# Patient Record
Sex: Female | Born: 1935 | Race: White | Hispanic: No | Marital: Married | Smoking: Never smoker
Health system: Southern US, Community
[De-identification: ages and names within clinical notes are randomized; demographics above are authoritative.]

## PROBLEM LIST (undated history)

## (undated) DIAGNOSIS — L0390 Cellulitis, unspecified: Secondary | ICD-10-CM

## (undated) DIAGNOSIS — J189 Pneumonia, unspecified organism: Secondary | ICD-10-CM

## (undated) DIAGNOSIS — M1990 Unspecified osteoarthritis, unspecified site: Secondary | ICD-10-CM

## (undated) DIAGNOSIS — E559 Vitamin D deficiency, unspecified: Secondary | ICD-10-CM

## (undated) DIAGNOSIS — J84112 Idiopathic pulmonary fibrosis: Secondary | ICD-10-CM

## (undated) DIAGNOSIS — N3090 Cystitis, unspecified without hematuria: Secondary | ICD-10-CM

## (undated) DIAGNOSIS — E7800 Pure hypercholesterolemia, unspecified: Secondary | ICD-10-CM

## (undated) DIAGNOSIS — K5900 Constipation, unspecified: Secondary | ICD-10-CM

## (undated) DIAGNOSIS — K5790 Diverticulosis of intestine, part unspecified, without perforation or abscess without bleeding: Secondary | ICD-10-CM

## (undated) DIAGNOSIS — B370 Candidal stomatitis: Secondary | ICD-10-CM

## (undated) DIAGNOSIS — M8580 Other specified disorders of bone density and structure, unspecified site: Secondary | ICD-10-CM

## (undated) DIAGNOSIS — R32 Unspecified urinary incontinence: Secondary | ICD-10-CM

## (undated) DIAGNOSIS — I10 Essential (primary) hypertension: Secondary | ICD-10-CM

## (undated) DIAGNOSIS — R0789 Other chest pain: Secondary | ICD-10-CM

## (undated) DIAGNOSIS — Z9889 Other specified postprocedural states: Secondary | ICD-10-CM

## (undated) DIAGNOSIS — R4182 Altered mental status, unspecified: Secondary | ICD-10-CM

## (undated) DIAGNOSIS — E669 Obesity, unspecified: Secondary | ICD-10-CM

## (undated) DIAGNOSIS — R0602 Shortness of breath: Secondary | ICD-10-CM

## (undated) DIAGNOSIS — G4730 Sleep apnea, unspecified: Secondary | ICD-10-CM

## (undated) DIAGNOSIS — Z6830 Body mass index (BMI) 30.0-30.9, adult: Secondary | ICD-10-CM

## (undated) DIAGNOSIS — R5383 Other fatigue: Secondary | ICD-10-CM

## (undated) DIAGNOSIS — I209 Angina pectoris, unspecified: Secondary | ICD-10-CM

## (undated) DIAGNOSIS — F419 Anxiety disorder, unspecified: Secondary | ICD-10-CM

## (undated) DIAGNOSIS — K529 Noninfective gastroenteritis and colitis, unspecified: Secondary | ICD-10-CM

## (undated) DIAGNOSIS — IMO0002 Reserved for concepts with insufficient information to code with codable children: Secondary | ICD-10-CM

## (undated) DIAGNOSIS — S32040A Wedge compression fracture of fourth lumbar vertebra, initial encounter for closed fracture: Secondary | ICD-10-CM

## (undated) DIAGNOSIS — Z8739 Personal history of other diseases of the musculoskeletal system and connective tissue: Secondary | ICD-10-CM

## (undated) HISTORY — DX: Body mass index (BMI) 30.0-30.9, adult: Z68.30

## (undated) HISTORY — DX: Unspecified urinary incontinence: R32

## (undated) HISTORY — DX: Personal history of other diseases of the musculoskeletal system and connective tissue: Z87.39

## (undated) HISTORY — DX: Pneumonia, unspecified organism: J18.9

## (undated) HISTORY — DX: Other specified postprocedural states: Z98.890

## (undated) HISTORY — DX: Obesity, unspecified: E66.9

## (undated) HISTORY — DX: Unspecified osteoarthritis, unspecified site: M19.90

## (undated) HISTORY — DX: Essential (primary) hypertension: I10

## (undated) HISTORY — PX: ROTATOR CUFF REPAIR: SHX139

## (undated) HISTORY — DX: Reserved for concepts with insufficient information to code with codable children: IMO0002

## (undated) HISTORY — DX: Shortness of breath: R06.02

## (undated) HISTORY — PX: JOINT REPLACEMENT: SHX530

## (undated) HISTORY — PX: TONSILLECTOMY: SUR1361

## (undated) HISTORY — DX: Pure hypercholesterolemia, unspecified: E78.00

## (undated) HISTORY — DX: Idiopathic pulmonary fibrosis: J84.112

## (undated) HISTORY — DX: Other specified disorders of bone density and structure, unspecified site: M85.80

## (undated) HISTORY — DX: Vitamin D deficiency, unspecified: E55.9

## (undated) HISTORY — DX: Other fatigue: R53.83

## (undated) HISTORY — PX: CHOLECYSTECTOMY: SHX55

## (undated) HISTORY — DX: Diverticulosis of intestine, part unspecified, without perforation or abscess without bleeding: K57.90

## (undated) HISTORY — PX: BACK SURGERY: SHX140

## (undated) HISTORY — PX: EYE SURGERY: SHX253

## (undated) HISTORY — DX: Cystitis, unspecified without hematuria: N30.90

## (undated) HISTORY — DX: Hypocalcemia: E83.51

## (undated) HISTORY — DX: Altered mental status, unspecified: R41.82

## (undated) HISTORY — DX: Sleep apnea, unspecified: G47.30

## (undated) HISTORY — DX: Other chest pain: R07.89

## (undated) HISTORY — DX: Constipation, unspecified: K59.00

## (undated) HISTORY — DX: Wedge compression fracture of fourth lumbar vertebra, initial encounter for closed fracture: S32.040A

## (undated) HISTORY — DX: Candidal stomatitis: B37.0

## (undated) HISTORY — DX: Noninfective gastroenteritis and colitis, unspecified: K52.9

## (undated) HISTORY — DX: Cellulitis, unspecified: L03.90

## (undated) HISTORY — PX: TOTAL SHOULDER REPLACEMENT: SUR1217

---

## 2001-08-12 ENCOUNTER — Other Ambulatory Visit: Admission: RE | Admit: 2001-08-12 | Discharge: 2001-08-12 | Payer: Self-pay | Admitting: Family Medicine

## 2003-04-28 ENCOUNTER — Other Ambulatory Visit: Admission: RE | Admit: 2003-04-28 | Discharge: 2003-04-28 | Payer: Self-pay | Admitting: Family Medicine

## 2004-07-23 ENCOUNTER — Ambulatory Visit: Payer: Self-pay | Admitting: Family Medicine

## 2005-07-29 ENCOUNTER — Ambulatory Visit: Payer: Self-pay | Admitting: Family Medicine

## 2006-03-11 ENCOUNTER — Ambulatory Visit: Payer: Self-pay

## 2006-03-26 ENCOUNTER — Ambulatory Visit: Payer: Self-pay

## 2006-04-19 ENCOUNTER — Encounter: Admission: RE | Admit: 2006-04-19 | Discharge: 2006-04-19 | Payer: Self-pay | Admitting: Unknown Physician Specialty

## 2006-05-16 ENCOUNTER — Inpatient Hospital Stay: Payer: Self-pay | Admitting: Unknown Physician Specialty

## 2006-10-16 ENCOUNTER — Ambulatory Visit: Payer: Self-pay | Admitting: Family Medicine

## 2006-11-27 ENCOUNTER — Ambulatory Visit: Payer: Self-pay | Admitting: Family Medicine

## 2007-09-03 LAB — HM COLONOSCOPY

## 2007-12-10 ENCOUNTER — Ambulatory Visit: Payer: Self-pay | Admitting: Family Medicine

## 2008-08-23 ENCOUNTER — Ambulatory Visit: Payer: Self-pay | Admitting: Unknown Physician Specialty

## 2009-03-09 ENCOUNTER — Ambulatory Visit: Payer: Self-pay | Admitting: Unknown Physician Specialty

## 2009-03-22 ENCOUNTER — Ambulatory Visit: Payer: Self-pay | Admitting: Unknown Physician Specialty

## 2009-03-30 ENCOUNTER — Ambulatory Visit: Payer: Self-pay | Admitting: Unknown Physician Specialty

## 2009-05-20 ENCOUNTER — Other Ambulatory Visit: Payer: Self-pay | Admitting: Family Medicine

## 2009-05-30 ENCOUNTER — Ambulatory Visit: Payer: Self-pay | Admitting: Family Medicine

## 2009-12-25 ENCOUNTER — Ambulatory Visit: Payer: Self-pay | Admitting: Unknown Physician Specialty

## 2010-01-08 ENCOUNTER — Ambulatory Visit: Payer: Self-pay | Admitting: Unknown Physician Specialty

## 2010-01-11 ENCOUNTER — Ambulatory Visit: Payer: Self-pay | Admitting: Unknown Physician Specialty

## 2010-08-10 ENCOUNTER — Ambulatory Visit: Payer: Self-pay | Admitting: Family Medicine

## 2010-08-21 ENCOUNTER — Ambulatory Visit: Payer: Self-pay | Admitting: Family Medicine

## 2010-12-03 ENCOUNTER — Emergency Department (HOSPITAL_COMMUNITY): Admission: EM | Admit: 2010-12-03 | Payer: Self-pay | Source: Home / Self Care

## 2011-08-15 ENCOUNTER — Ambulatory Visit: Payer: Self-pay | Admitting: Family Medicine

## 2011-09-10 DIAGNOSIS — M25519 Pain in unspecified shoulder: Secondary | ICD-10-CM | POA: Diagnosis not present

## 2011-09-17 DIAGNOSIS — M25519 Pain in unspecified shoulder: Secondary | ICD-10-CM | POA: Diagnosis not present

## 2011-09-17 DIAGNOSIS — Z96619 Presence of unspecified artificial shoulder joint: Secondary | ICD-10-CM | POA: Diagnosis not present

## 2011-09-24 DIAGNOSIS — M25519 Pain in unspecified shoulder: Secondary | ICD-10-CM | POA: Diagnosis not present

## 2011-09-24 DIAGNOSIS — Z96619 Presence of unspecified artificial shoulder joint: Secondary | ICD-10-CM | POA: Diagnosis not present

## 2011-10-01 DIAGNOSIS — M25519 Pain in unspecified shoulder: Secondary | ICD-10-CM | POA: Diagnosis not present

## 2011-10-01 DIAGNOSIS — Z96619 Presence of unspecified artificial shoulder joint: Secondary | ICD-10-CM | POA: Diagnosis not present

## 2011-10-04 DIAGNOSIS — Z96619 Presence of unspecified artificial shoulder joint: Secondary | ICD-10-CM | POA: Diagnosis not present

## 2011-10-04 DIAGNOSIS — M25519 Pain in unspecified shoulder: Secondary | ICD-10-CM | POA: Diagnosis not present

## 2011-10-08 DIAGNOSIS — M25519 Pain in unspecified shoulder: Secondary | ICD-10-CM | POA: Diagnosis not present

## 2011-10-08 DIAGNOSIS — Z96619 Presence of unspecified artificial shoulder joint: Secondary | ICD-10-CM | POA: Diagnosis not present

## 2011-10-15 DIAGNOSIS — Z96619 Presence of unspecified artificial shoulder joint: Secondary | ICD-10-CM | POA: Diagnosis not present

## 2011-10-15 DIAGNOSIS — M25519 Pain in unspecified shoulder: Secondary | ICD-10-CM | POA: Diagnosis not present

## 2011-10-22 DIAGNOSIS — M25519 Pain in unspecified shoulder: Secondary | ICD-10-CM | POA: Diagnosis not present

## 2011-10-22 DIAGNOSIS — Z96619 Presence of unspecified artificial shoulder joint: Secondary | ICD-10-CM | POA: Diagnosis not present

## 2011-10-28 DIAGNOSIS — R0789 Other chest pain: Secondary | ICD-10-CM | POA: Diagnosis not present

## 2011-10-28 DIAGNOSIS — I10 Essential (primary) hypertension: Secondary | ICD-10-CM | POA: Diagnosis not present

## 2011-10-29 DIAGNOSIS — Z96619 Presence of unspecified artificial shoulder joint: Secondary | ICD-10-CM | POA: Diagnosis not present

## 2011-10-29 DIAGNOSIS — M25519 Pain in unspecified shoulder: Secondary | ICD-10-CM | POA: Diagnosis not present

## 2011-11-04 ENCOUNTER — Ambulatory Visit: Payer: Self-pay | Admitting: Internal Medicine

## 2011-11-04 DIAGNOSIS — R0989 Other specified symptoms and signs involving the circulatory and respiratory systems: Secondary | ICD-10-CM | POA: Diagnosis not present

## 2011-11-04 DIAGNOSIS — R0609 Other forms of dyspnea: Secondary | ICD-10-CM | POA: Diagnosis not present

## 2011-11-04 DIAGNOSIS — R0789 Other chest pain: Secondary | ICD-10-CM | POA: Diagnosis not present

## 2011-11-04 DIAGNOSIS — I209 Angina pectoris, unspecified: Secondary | ICD-10-CM | POA: Diagnosis not present

## 2011-11-04 DIAGNOSIS — R079 Chest pain, unspecified: Secondary | ICD-10-CM | POA: Diagnosis not present

## 2011-11-18 DIAGNOSIS — E7800 Pure hypercholesterolemia, unspecified: Secondary | ICD-10-CM | POA: Diagnosis not present

## 2011-11-18 DIAGNOSIS — G4700 Insomnia, unspecified: Secondary | ICD-10-CM | POA: Diagnosis not present

## 2011-11-18 DIAGNOSIS — R5383 Other fatigue: Secondary | ICD-10-CM | POA: Diagnosis not present

## 2011-11-18 DIAGNOSIS — R413 Other amnesia: Secondary | ICD-10-CM | POA: Diagnosis not present

## 2011-11-18 DIAGNOSIS — R5381 Other malaise: Secondary | ICD-10-CM | POA: Diagnosis not present

## 2011-11-18 DIAGNOSIS — E559 Vitamin D deficiency, unspecified: Secondary | ICD-10-CM | POA: Diagnosis not present

## 2011-11-18 DIAGNOSIS — I10 Essential (primary) hypertension: Secondary | ICD-10-CM | POA: Diagnosis not present

## 2011-12-05 ENCOUNTER — Ambulatory Visit: Payer: Self-pay | Admitting: Family Medicine

## 2011-12-05 DIAGNOSIS — G2589 Other specified extrapyramidal and movement disorders: Secondary | ICD-10-CM | POA: Diagnosis not present

## 2011-12-05 DIAGNOSIS — G2581 Restless legs syndrome: Secondary | ICD-10-CM | POA: Diagnosis not present

## 2011-12-05 DIAGNOSIS — R0609 Other forms of dyspnea: Secondary | ICD-10-CM | POA: Diagnosis not present

## 2011-12-05 DIAGNOSIS — G4733 Obstructive sleep apnea (adult) (pediatric): Secondary | ICD-10-CM | POA: Diagnosis not present

## 2011-12-05 DIAGNOSIS — R0989 Other specified symptoms and signs involving the circulatory and respiratory systems: Secondary | ICD-10-CM | POA: Diagnosis not present

## 2011-12-18 DIAGNOSIS — I209 Angina pectoris, unspecified: Secondary | ICD-10-CM | POA: Diagnosis not present

## 2011-12-18 DIAGNOSIS — R0789 Other chest pain: Secondary | ICD-10-CM | POA: Diagnosis not present

## 2011-12-20 DIAGNOSIS — M7660 Achilles tendinitis, unspecified leg: Secondary | ICD-10-CM | POA: Diagnosis not present

## 2011-12-23 DIAGNOSIS — R6889 Other general symptoms and signs: Secondary | ICD-10-CM | POA: Diagnosis not present

## 2011-12-23 DIAGNOSIS — R413 Other amnesia: Secondary | ICD-10-CM | POA: Diagnosis not present

## 2011-12-23 DIAGNOSIS — R05 Cough: Secondary | ICD-10-CM | POA: Diagnosis not present

## 2011-12-23 DIAGNOSIS — R059 Cough, unspecified: Secondary | ICD-10-CM | POA: Diagnosis not present

## 2011-12-23 DIAGNOSIS — E7800 Pure hypercholesterolemia, unspecified: Secondary | ICD-10-CM | POA: Diagnosis not present

## 2011-12-25 ENCOUNTER — Ambulatory Visit: Payer: Self-pay | Admitting: Family Medicine

## 2011-12-25 DIAGNOSIS — R05 Cough: Secondary | ICD-10-CM | POA: Diagnosis not present

## 2011-12-25 DIAGNOSIS — R161 Splenomegaly, not elsewhere classified: Secondary | ICD-10-CM | POA: Diagnosis not present

## 2011-12-25 DIAGNOSIS — E7800 Pure hypercholesterolemia, unspecified: Secondary | ICD-10-CM | POA: Diagnosis not present

## 2011-12-25 DIAGNOSIS — J9819 Other pulmonary collapse: Secondary | ICD-10-CM | POA: Diagnosis not present

## 2011-12-25 DIAGNOSIS — R9389 Abnormal findings on diagnostic imaging of other specified body structures: Secondary | ICD-10-CM | POA: Diagnosis not present

## 2011-12-25 DIAGNOSIS — R0602 Shortness of breath: Secondary | ICD-10-CM | POA: Diagnosis not present

## 2011-12-25 DIAGNOSIS — R059 Cough, unspecified: Secondary | ICD-10-CM | POA: Diagnosis not present

## 2011-12-25 DIAGNOSIS — R413 Other amnesia: Secondary | ICD-10-CM | POA: Diagnosis not present

## 2012-01-09 ENCOUNTER — Ambulatory Visit: Payer: Self-pay | Admitting: Family Medicine

## 2012-01-09 DIAGNOSIS — G4734 Idiopathic sleep related nonobstructive alveolar hypoventilation: Secondary | ICD-10-CM | POA: Diagnosis not present

## 2012-01-09 DIAGNOSIS — R0609 Other forms of dyspnea: Secondary | ICD-10-CM | POA: Diagnosis not present

## 2012-01-09 DIAGNOSIS — G4733 Obstructive sleep apnea (adult) (pediatric): Secondary | ICD-10-CM | POA: Diagnosis not present

## 2012-01-09 DIAGNOSIS — R0989 Other specified symptoms and signs involving the circulatory and respiratory systems: Secondary | ICD-10-CM | POA: Diagnosis not present

## 2012-01-23 ENCOUNTER — Ambulatory Visit: Payer: Self-pay | Admitting: Family Medicine

## 2012-01-23 DIAGNOSIS — R0602 Shortness of breath: Secondary | ICD-10-CM | POA: Diagnosis not present

## 2012-01-23 DIAGNOSIS — R059 Cough, unspecified: Secondary | ICD-10-CM | POA: Diagnosis not present

## 2012-01-23 DIAGNOSIS — R918 Other nonspecific abnormal finding of lung field: Secondary | ICD-10-CM | POA: Diagnosis not present

## 2012-01-24 DIAGNOSIS — R05 Cough: Secondary | ICD-10-CM | POA: Diagnosis not present

## 2012-01-24 DIAGNOSIS — R0602 Shortness of breath: Secondary | ICD-10-CM | POA: Diagnosis not present

## 2012-01-24 DIAGNOSIS — R413 Other amnesia: Secondary | ICD-10-CM | POA: Diagnosis not present

## 2012-01-24 DIAGNOSIS — R059 Cough, unspecified: Secondary | ICD-10-CM | POA: Diagnosis not present

## 2012-01-24 DIAGNOSIS — E7800 Pure hypercholesterolemia, unspecified: Secondary | ICD-10-CM | POA: Diagnosis not present

## 2012-01-30 ENCOUNTER — Ambulatory Visit: Payer: Self-pay | Admitting: Family Medicine

## 2012-01-30 DIAGNOSIS — G4733 Obstructive sleep apnea (adult) (pediatric): Secondary | ICD-10-CM | POA: Diagnosis not present

## 2012-02-07 DIAGNOSIS — R413 Other amnesia: Secondary | ICD-10-CM | POA: Diagnosis not present

## 2012-02-07 DIAGNOSIS — E7800 Pure hypercholesterolemia, unspecified: Secondary | ICD-10-CM | POA: Diagnosis not present

## 2012-02-07 DIAGNOSIS — G4730 Sleep apnea, unspecified: Secondary | ICD-10-CM | POA: Diagnosis not present

## 2012-02-07 DIAGNOSIS — R0602 Shortness of breath: Secondary | ICD-10-CM | POA: Diagnosis not present

## 2012-02-19 DIAGNOSIS — R0602 Shortness of breath: Secondary | ICD-10-CM | POA: Diagnosis not present

## 2012-02-19 DIAGNOSIS — E669 Obesity, unspecified: Secondary | ICD-10-CM | POA: Diagnosis not present

## 2012-02-19 DIAGNOSIS — J9819 Other pulmonary collapse: Secondary | ICD-10-CM | POA: Diagnosis not present

## 2012-02-24 ENCOUNTER — Ambulatory Visit: Payer: Self-pay | Admitting: Specialist

## 2012-02-24 DIAGNOSIS — R0602 Shortness of breath: Secondary | ICD-10-CM | POA: Diagnosis not present

## 2012-02-24 DIAGNOSIS — J8410 Pulmonary fibrosis, unspecified: Secondary | ICD-10-CM | POA: Diagnosis not present

## 2012-02-24 DIAGNOSIS — R918 Other nonspecific abnormal finding of lung field: Secondary | ICD-10-CM | POA: Diagnosis not present

## 2012-02-26 DIAGNOSIS — J8410 Pulmonary fibrosis, unspecified: Secondary | ICD-10-CM | POA: Diagnosis not present

## 2012-02-26 DIAGNOSIS — J45909 Unspecified asthma, uncomplicated: Secondary | ICD-10-CM | POA: Diagnosis not present

## 2012-02-26 DIAGNOSIS — R0602 Shortness of breath: Secondary | ICD-10-CM | POA: Diagnosis not present

## 2012-02-26 DIAGNOSIS — G4733 Obstructive sleep apnea (adult) (pediatric): Secondary | ICD-10-CM | POA: Diagnosis not present

## 2012-03-11 ENCOUNTER — Institutional Professional Consult (permissible substitution): Payer: Self-pay | Admitting: Pulmonary Disease

## 2012-04-01 DIAGNOSIS — E669 Obesity, unspecified: Secondary | ICD-10-CM | POA: Diagnosis not present

## 2012-04-01 DIAGNOSIS — G4700 Insomnia, unspecified: Secondary | ICD-10-CM | POA: Diagnosis not present

## 2012-04-01 DIAGNOSIS — E7800 Pure hypercholesterolemia, unspecified: Secondary | ICD-10-CM | POA: Diagnosis not present

## 2012-04-01 DIAGNOSIS — R413 Other amnesia: Secondary | ICD-10-CM | POA: Diagnosis not present

## 2012-04-28 ENCOUNTER — Ambulatory Visit: Payer: Self-pay | Admitting: Family Medicine

## 2012-04-28 DIAGNOSIS — M949 Disorder of cartilage, unspecified: Secondary | ICD-10-CM | POA: Diagnosis not present

## 2012-04-28 DIAGNOSIS — M899 Disorder of bone, unspecified: Secondary | ICD-10-CM | POA: Diagnosis not present

## 2012-05-18 DIAGNOSIS — E7800 Pure hypercholesterolemia, unspecified: Secondary | ICD-10-CM | POA: Diagnosis not present

## 2012-05-18 DIAGNOSIS — M899 Disorder of bone, unspecified: Secondary | ICD-10-CM | POA: Diagnosis not present

## 2012-05-18 DIAGNOSIS — Z23 Encounter for immunization: Secondary | ICD-10-CM | POA: Diagnosis not present

## 2012-05-18 DIAGNOSIS — I10 Essential (primary) hypertension: Secondary | ICD-10-CM | POA: Diagnosis not present

## 2012-05-18 DIAGNOSIS — R413 Other amnesia: Secondary | ICD-10-CM | POA: Diagnosis not present

## 2012-05-18 DIAGNOSIS — M949 Disorder of cartilage, unspecified: Secondary | ICD-10-CM | POA: Diagnosis not present

## 2012-05-20 DIAGNOSIS — E7800 Pure hypercholesterolemia, unspecified: Secondary | ICD-10-CM | POA: Diagnosis not present

## 2012-05-20 DIAGNOSIS — E559 Vitamin D deficiency, unspecified: Secondary | ICD-10-CM | POA: Diagnosis not present

## 2012-06-04 DIAGNOSIS — M25519 Pain in unspecified shoulder: Secondary | ICD-10-CM | POA: Diagnosis not present

## 2012-06-11 DIAGNOSIS — E876 Hypokalemia: Secondary | ICD-10-CM | POA: Diagnosis not present

## 2012-06-25 ENCOUNTER — Ambulatory Visit: Payer: Self-pay | Admitting: Specialist

## 2012-06-25 DIAGNOSIS — J8410 Pulmonary fibrosis, unspecified: Secondary | ICD-10-CM | POA: Diagnosis not present

## 2012-06-25 DIAGNOSIS — J984 Other disorders of lung: Secondary | ICD-10-CM | POA: Diagnosis not present

## 2012-06-25 DIAGNOSIS — R599 Enlarged lymph nodes, unspecified: Secondary | ICD-10-CM | POA: Diagnosis not present

## 2012-06-29 DIAGNOSIS — H02409 Unspecified ptosis of unspecified eyelid: Secondary | ICD-10-CM | POA: Diagnosis not present

## 2012-07-01 DIAGNOSIS — R0609 Other forms of dyspnea: Secondary | ICD-10-CM | POA: Diagnosis not present

## 2012-07-01 DIAGNOSIS — R0989 Other specified symptoms and signs involving the circulatory and respiratory systems: Secondary | ICD-10-CM | POA: Diagnosis not present

## 2012-07-01 DIAGNOSIS — R9431 Abnormal electrocardiogram [ECG] [EKG]: Secondary | ICD-10-CM | POA: Diagnosis not present

## 2012-07-07 DIAGNOSIS — J8410 Pulmonary fibrosis, unspecified: Secondary | ICD-10-CM | POA: Diagnosis not present

## 2012-07-22 DIAGNOSIS — L821 Other seborrheic keratosis: Secondary | ICD-10-CM | POA: Diagnosis not present

## 2012-07-22 DIAGNOSIS — L570 Actinic keratosis: Secondary | ICD-10-CM | POA: Diagnosis not present

## 2012-07-22 DIAGNOSIS — R229 Localized swelling, mass and lump, unspecified: Secondary | ICD-10-CM | POA: Diagnosis not present

## 2012-07-22 DIAGNOSIS — L538 Other specified erythematous conditions: Secondary | ICD-10-CM | POA: Diagnosis not present

## 2012-07-23 ENCOUNTER — Ambulatory Visit: Payer: Self-pay | Admitting: Specialist

## 2012-07-23 DIAGNOSIS — R599 Enlarged lymph nodes, unspecified: Secondary | ICD-10-CM | POA: Diagnosis not present

## 2012-07-23 DIAGNOSIS — C3490 Malignant neoplasm of unspecified part of unspecified bronchus or lung: Secondary | ICD-10-CM | POA: Diagnosis not present

## 2012-07-28 DIAGNOSIS — R222 Localized swelling, mass and lump, trunk: Secondary | ICD-10-CM | POA: Diagnosis not present

## 2012-08-10 DIAGNOSIS — H02409 Unspecified ptosis of unspecified eyelid: Secondary | ICD-10-CM | POA: Diagnosis not present

## 2012-08-17 DIAGNOSIS — G4730 Sleep apnea, unspecified: Secondary | ICD-10-CM | POA: Diagnosis not present

## 2012-08-17 DIAGNOSIS — E669 Obesity, unspecified: Secondary | ICD-10-CM | POA: Diagnosis not present

## 2012-08-17 DIAGNOSIS — R05 Cough: Secondary | ICD-10-CM | POA: Diagnosis not present

## 2012-08-17 DIAGNOSIS — Z23 Encounter for immunization: Secondary | ICD-10-CM | POA: Diagnosis not present

## 2012-08-17 DIAGNOSIS — R059 Cough, unspecified: Secondary | ICD-10-CM | POA: Diagnosis not present

## 2012-08-20 DIAGNOSIS — H02409 Unspecified ptosis of unspecified eyelid: Secondary | ICD-10-CM | POA: Diagnosis not present

## 2012-09-29 ENCOUNTER — Ambulatory Visit: Payer: Self-pay | Admitting: Family Medicine

## 2012-09-29 DIAGNOSIS — Z1231 Encounter for screening mammogram for malignant neoplasm of breast: Secondary | ICD-10-CM | POA: Diagnosis not present

## 2012-10-30 ENCOUNTER — Ambulatory Visit: Payer: Self-pay | Admitting: Anesthesiology

## 2012-10-30 DIAGNOSIS — I10 Essential (primary) hypertension: Secondary | ICD-10-CM | POA: Diagnosis not present

## 2012-10-30 DIAGNOSIS — Z01810 Encounter for preprocedural cardiovascular examination: Secondary | ICD-10-CM | POA: Diagnosis not present

## 2012-11-03 ENCOUNTER — Ambulatory Visit: Payer: Self-pay

## 2012-11-03 DIAGNOSIS — G4730 Sleep apnea, unspecified: Secondary | ICD-10-CM | POA: Diagnosis not present

## 2012-11-03 DIAGNOSIS — R002 Palpitations: Secondary | ICD-10-CM | POA: Diagnosis not present

## 2012-11-03 DIAGNOSIS — Z7982 Long term (current) use of aspirin: Secondary | ICD-10-CM | POA: Diagnosis not present

## 2012-11-03 DIAGNOSIS — H02409 Unspecified ptosis of unspecified eyelid: Secondary | ICD-10-CM | POA: Diagnosis not present

## 2012-11-03 DIAGNOSIS — R12 Heartburn: Secondary | ICD-10-CM | POA: Diagnosis not present

## 2012-11-03 DIAGNOSIS — Z96619 Presence of unspecified artificial shoulder joint: Secondary | ICD-10-CM | POA: Diagnosis not present

## 2012-11-03 DIAGNOSIS — R51 Headache: Secondary | ICD-10-CM | POA: Diagnosis not present

## 2012-11-03 DIAGNOSIS — I10 Essential (primary) hypertension: Secondary | ICD-10-CM | POA: Diagnosis not present

## 2012-11-03 DIAGNOSIS — J449 Chronic obstructive pulmonary disease, unspecified: Secondary | ICD-10-CM | POA: Diagnosis not present

## 2012-11-03 DIAGNOSIS — H02839 Dermatochalasis of unspecified eye, unspecified eyelid: Secondary | ICD-10-CM | POA: Diagnosis not present

## 2012-11-03 DIAGNOSIS — R059 Cough, unspecified: Secondary | ICD-10-CM | POA: Diagnosis not present

## 2012-11-03 DIAGNOSIS — Z79899 Other long term (current) drug therapy: Secondary | ICD-10-CM | POA: Diagnosis not present

## 2012-12-14 DIAGNOSIS — J449 Chronic obstructive pulmonary disease, unspecified: Secondary | ICD-10-CM | POA: Diagnosis not present

## 2012-12-14 DIAGNOSIS — J8410 Pulmonary fibrosis, unspecified: Secondary | ICD-10-CM | POA: Diagnosis not present

## 2012-12-14 DIAGNOSIS — R05 Cough: Secondary | ICD-10-CM | POA: Diagnosis not present

## 2012-12-14 DIAGNOSIS — R059 Cough, unspecified: Secondary | ICD-10-CM | POA: Diagnosis not present

## 2012-12-14 DIAGNOSIS — R0602 Shortness of breath: Secondary | ICD-10-CM | POA: Diagnosis not present

## 2012-12-30 DIAGNOSIS — R0609 Other forms of dyspnea: Secondary | ICD-10-CM | POA: Diagnosis not present

## 2012-12-30 DIAGNOSIS — I369 Nonrheumatic tricuspid valve disorder, unspecified: Secondary | ICD-10-CM | POA: Diagnosis not present

## 2012-12-30 DIAGNOSIS — R0989 Other specified symptoms and signs involving the circulatory and respiratory systems: Secondary | ICD-10-CM | POA: Diagnosis not present

## 2013-01-05 ENCOUNTER — Ambulatory Visit: Payer: Self-pay | Admitting: Specialist

## 2013-01-05 DIAGNOSIS — R918 Other nonspecific abnormal finding of lung field: Secondary | ICD-10-CM | POA: Diagnosis not present

## 2013-01-05 DIAGNOSIS — J984 Other disorders of lung: Secondary | ICD-10-CM | POA: Diagnosis not present

## 2013-01-11 DIAGNOSIS — R918 Other nonspecific abnormal finding of lung field: Secondary | ICD-10-CM | POA: Diagnosis not present

## 2013-01-11 DIAGNOSIS — J8410 Pulmonary fibrosis, unspecified: Secondary | ICD-10-CM | POA: Diagnosis not present

## 2013-01-11 DIAGNOSIS — R0602 Shortness of breath: Secondary | ICD-10-CM | POA: Diagnosis not present

## 2013-01-11 DIAGNOSIS — G4733 Obstructive sleep apnea (adult) (pediatric): Secondary | ICD-10-CM | POA: Diagnosis not present

## 2013-01-13 DIAGNOSIS — H02409 Unspecified ptosis of unspecified eyelid: Secondary | ICD-10-CM | POA: Diagnosis not present

## 2013-01-18 DIAGNOSIS — R011 Cardiac murmur, unspecified: Secondary | ICD-10-CM | POA: Diagnosis not present

## 2013-01-18 DIAGNOSIS — I369 Nonrheumatic tricuspid valve disorder, unspecified: Secondary | ICD-10-CM | POA: Diagnosis not present

## 2013-01-18 DIAGNOSIS — R0602 Shortness of breath: Secondary | ICD-10-CM | POA: Diagnosis not present

## 2013-03-11 DIAGNOSIS — H02409 Unspecified ptosis of unspecified eyelid: Secondary | ICD-10-CM | POA: Diagnosis not present

## 2013-03-11 DIAGNOSIS — C44101 Unspecified malignant neoplasm of skin of unspecified eyelid, including canthus: Secondary | ICD-10-CM | POA: Diagnosis not present

## 2013-03-12 DIAGNOSIS — D1801 Hemangioma of skin and subcutaneous tissue: Secondary | ICD-10-CM | POA: Diagnosis not present

## 2013-03-12 DIAGNOSIS — C44101 Unspecified malignant neoplasm of skin of unspecified eyelid, including canthus: Secondary | ICD-10-CM | POA: Diagnosis not present

## 2013-04-20 DIAGNOSIS — H2510 Age-related nuclear cataract, unspecified eye: Secondary | ICD-10-CM | POA: Diagnosis not present

## 2013-05-06 DIAGNOSIS — L821 Other seborrheic keratosis: Secondary | ICD-10-CM | POA: Diagnosis not present

## 2013-05-06 DIAGNOSIS — L819 Disorder of pigmentation, unspecified: Secondary | ICD-10-CM | POA: Diagnosis not present

## 2013-05-06 DIAGNOSIS — D485 Neoplasm of uncertain behavior of skin: Secondary | ICD-10-CM | POA: Diagnosis not present

## 2013-05-06 DIAGNOSIS — L408 Other psoriasis: Secondary | ICD-10-CM | POA: Diagnosis not present

## 2013-05-06 DIAGNOSIS — D0439 Carcinoma in situ of skin of other parts of face: Secondary | ICD-10-CM | POA: Diagnosis not present

## 2013-05-06 DIAGNOSIS — D0430 Carcinoma in situ of skin of unspecified part of face: Secondary | ICD-10-CM | POA: Diagnosis not present

## 2013-05-08 DIAGNOSIS — E669 Obesity, unspecified: Secondary | ICD-10-CM | POA: Diagnosis not present

## 2013-05-08 DIAGNOSIS — Z23 Encounter for immunization: Secondary | ICD-10-CM | POA: Diagnosis not present

## 2013-05-08 DIAGNOSIS — R059 Cough, unspecified: Secondary | ICD-10-CM | POA: Diagnosis not present

## 2013-05-08 DIAGNOSIS — G4730 Sleep apnea, unspecified: Secondary | ICD-10-CM | POA: Diagnosis not present

## 2013-05-08 DIAGNOSIS — R05 Cough: Secondary | ICD-10-CM | POA: Diagnosis not present

## 2013-05-25 DIAGNOSIS — R05 Cough: Secondary | ICD-10-CM | POA: Diagnosis not present

## 2013-05-25 DIAGNOSIS — R059 Cough, unspecified: Secondary | ICD-10-CM | POA: Diagnosis not present

## 2013-05-25 DIAGNOSIS — J8410 Pulmonary fibrosis, unspecified: Secondary | ICD-10-CM | POA: Diagnosis not present

## 2013-05-25 DIAGNOSIS — G4733 Obstructive sleep apnea (adult) (pediatric): Secondary | ICD-10-CM | POA: Diagnosis not present

## 2013-06-01 DIAGNOSIS — D0439 Carcinoma in situ of skin of other parts of face: Secondary | ICD-10-CM | POA: Diagnosis not present

## 2013-06-01 DIAGNOSIS — D0430 Carcinoma in situ of skin of unspecified part of face: Secondary | ICD-10-CM | POA: Diagnosis not present

## 2013-06-01 DIAGNOSIS — L408 Other psoriasis: Secondary | ICD-10-CM | POA: Diagnosis not present

## 2013-06-16 ENCOUNTER — Ambulatory Visit: Payer: Self-pay | Admitting: Specialist

## 2013-06-16 DIAGNOSIS — R599 Enlarged lymph nodes, unspecified: Secondary | ICD-10-CM | POA: Diagnosis not present

## 2013-06-17 DIAGNOSIS — J449 Chronic obstructive pulmonary disease, unspecified: Secondary | ICD-10-CM | POA: Diagnosis not present

## 2013-06-17 DIAGNOSIS — R0989 Other specified symptoms and signs involving the circulatory and respiratory systems: Secondary | ICD-10-CM | POA: Diagnosis not present

## 2013-06-17 DIAGNOSIS — R0609 Other forms of dyspnea: Secondary | ICD-10-CM | POA: Diagnosis not present

## 2013-06-17 DIAGNOSIS — G4730 Sleep apnea, unspecified: Secondary | ICD-10-CM | POA: Diagnosis not present

## 2013-06-23 DIAGNOSIS — J8410 Pulmonary fibrosis, unspecified: Secondary | ICD-10-CM | POA: Diagnosis not present

## 2013-06-23 DIAGNOSIS — G4710 Hypersomnia, unspecified: Secondary | ICD-10-CM | POA: Diagnosis not present

## 2013-06-23 DIAGNOSIS — R05 Cough: Secondary | ICD-10-CM | POA: Diagnosis not present

## 2013-06-23 DIAGNOSIS — R0609 Other forms of dyspnea: Secondary | ICD-10-CM | POA: Diagnosis not present

## 2013-06-23 DIAGNOSIS — R059 Cough, unspecified: Secondary | ICD-10-CM | POA: Diagnosis not present

## 2013-06-23 DIAGNOSIS — R0989 Other specified symptoms and signs involving the circulatory and respiratory systems: Secondary | ICD-10-CM | POA: Diagnosis not present

## 2013-07-05 DIAGNOSIS — D0430 Carcinoma in situ of skin of unspecified part of face: Secondary | ICD-10-CM | POA: Diagnosis not present

## 2013-07-05 DIAGNOSIS — D0439 Carcinoma in situ of skin of other parts of face: Secondary | ICD-10-CM | POA: Diagnosis not present

## 2013-08-04 DIAGNOSIS — J449 Chronic obstructive pulmonary disease, unspecified: Secondary | ICD-10-CM | POA: Diagnosis not present

## 2013-08-04 DIAGNOSIS — R0609 Other forms of dyspnea: Secondary | ICD-10-CM | POA: Diagnosis not present

## 2013-08-04 DIAGNOSIS — G4733 Obstructive sleep apnea (adult) (pediatric): Secondary | ICD-10-CM | POA: Diagnosis not present

## 2013-08-04 DIAGNOSIS — J8410 Pulmonary fibrosis, unspecified: Secondary | ICD-10-CM | POA: Diagnosis not present

## 2013-08-04 DIAGNOSIS — R0989 Other specified symptoms and signs involving the circulatory and respiratory systems: Secondary | ICD-10-CM | POA: Diagnosis not present

## 2013-08-10 DIAGNOSIS — M76899 Other specified enthesopathies of unspecified lower limb, excluding foot: Secondary | ICD-10-CM | POA: Diagnosis not present

## 2013-08-23 DIAGNOSIS — M545 Low back pain, unspecified: Secondary | ICD-10-CM | POA: Diagnosis not present

## 2013-08-23 DIAGNOSIS — M5430 Sciatica, unspecified side: Secondary | ICD-10-CM | POA: Diagnosis not present

## 2013-08-25 DIAGNOSIS — M76899 Other specified enthesopathies of unspecified lower limb, excluding foot: Secondary | ICD-10-CM | POA: Diagnosis not present

## 2013-08-25 DIAGNOSIS — M545 Low back pain, unspecified: Secondary | ICD-10-CM | POA: Diagnosis not present

## 2013-08-25 DIAGNOSIS — M5430 Sciatica, unspecified side: Secondary | ICD-10-CM | POA: Diagnosis not present

## 2013-09-01 DIAGNOSIS — M545 Low back pain, unspecified: Secondary | ICD-10-CM | POA: Diagnosis not present

## 2013-09-01 DIAGNOSIS — M5430 Sciatica, unspecified side: Secondary | ICD-10-CM | POA: Diagnosis not present

## 2013-09-01 DIAGNOSIS — M76899 Other specified enthesopathies of unspecified lower limb, excluding foot: Secondary | ICD-10-CM | POA: Diagnosis not present

## 2013-09-07 DIAGNOSIS — M25519 Pain in unspecified shoulder: Secondary | ICD-10-CM | POA: Diagnosis not present

## 2013-09-07 DIAGNOSIS — M76899 Other specified enthesopathies of unspecified lower limb, excluding foot: Secondary | ICD-10-CM | POA: Diagnosis not present

## 2013-09-09 DIAGNOSIS — M810 Age-related osteoporosis without current pathological fracture: Secondary | ICD-10-CM | POA: Diagnosis not present

## 2013-09-13 DIAGNOSIS — M76899 Other specified enthesopathies of unspecified lower limb, excluding foot: Secondary | ICD-10-CM | POA: Diagnosis not present

## 2013-09-13 DIAGNOSIS — M75120 Complete rotator cuff tear or rupture of unspecified shoulder, not specified as traumatic: Secondary | ICD-10-CM | POA: Diagnosis not present

## 2013-09-13 DIAGNOSIS — M25519 Pain in unspecified shoulder: Secondary | ICD-10-CM | POA: Diagnosis not present

## 2013-09-13 DIAGNOSIS — M19019 Primary osteoarthritis, unspecified shoulder: Secondary | ICD-10-CM | POA: Diagnosis not present

## 2013-09-15 DIAGNOSIS — M75120 Complete rotator cuff tear or rupture of unspecified shoulder, not specified as traumatic: Secondary | ICD-10-CM | POA: Diagnosis not present

## 2013-09-15 DIAGNOSIS — M76899 Other specified enthesopathies of unspecified lower limb, excluding foot: Secondary | ICD-10-CM | POA: Diagnosis not present

## 2013-09-15 DIAGNOSIS — M25519 Pain in unspecified shoulder: Secondary | ICD-10-CM | POA: Diagnosis not present

## 2013-09-15 DIAGNOSIS — M19019 Primary osteoarthritis, unspecified shoulder: Secondary | ICD-10-CM | POA: Diagnosis not present

## 2013-09-21 DIAGNOSIS — M19019 Primary osteoarthritis, unspecified shoulder: Secondary | ICD-10-CM | POA: Diagnosis not present

## 2013-09-21 DIAGNOSIS — M5430 Sciatica, unspecified side: Secondary | ICD-10-CM | POA: Diagnosis not present

## 2013-09-21 DIAGNOSIS — M75120 Complete rotator cuff tear or rupture of unspecified shoulder, not specified as traumatic: Secondary | ICD-10-CM | POA: Diagnosis not present

## 2013-09-21 DIAGNOSIS — M76899 Other specified enthesopathies of unspecified lower limb, excluding foot: Secondary | ICD-10-CM | POA: Diagnosis not present

## 2013-09-23 DIAGNOSIS — M545 Low back pain, unspecified: Secondary | ICD-10-CM | POA: Diagnosis not present

## 2013-09-23 DIAGNOSIS — M19019 Primary osteoarthritis, unspecified shoulder: Secondary | ICD-10-CM | POA: Diagnosis not present

## 2013-09-23 DIAGNOSIS — M5430 Sciatica, unspecified side: Secondary | ICD-10-CM | POA: Diagnosis not present

## 2013-09-23 DIAGNOSIS — M76899 Other specified enthesopathies of unspecified lower limb, excluding foot: Secondary | ICD-10-CM | POA: Diagnosis not present

## 2013-09-27 DIAGNOSIS — M545 Low back pain, unspecified: Secondary | ICD-10-CM | POA: Diagnosis not present

## 2013-09-27 DIAGNOSIS — M76899 Other specified enthesopathies of unspecified lower limb, excluding foot: Secondary | ICD-10-CM | POA: Diagnosis not present

## 2013-09-27 DIAGNOSIS — M5430 Sciatica, unspecified side: Secondary | ICD-10-CM | POA: Diagnosis not present

## 2013-09-28 DIAGNOSIS — I10 Essential (primary) hypertension: Secondary | ICD-10-CM | POA: Diagnosis not present

## 2013-09-28 DIAGNOSIS — R413 Other amnesia: Secondary | ICD-10-CM | POA: Diagnosis not present

## 2013-09-28 DIAGNOSIS — E7800 Pure hypercholesterolemia, unspecified: Secondary | ICD-10-CM | POA: Diagnosis not present

## 2013-10-04 DIAGNOSIS — J189 Pneumonia, unspecified organism: Secondary | ICD-10-CM | POA: Diagnosis not present

## 2013-10-04 DIAGNOSIS — G4733 Obstructive sleep apnea (adult) (pediatric): Secondary | ICD-10-CM | POA: Diagnosis not present

## 2013-10-04 DIAGNOSIS — R059 Cough, unspecified: Secondary | ICD-10-CM | POA: Diagnosis not present

## 2013-10-04 DIAGNOSIS — R05 Cough: Secondary | ICD-10-CM | POA: Diagnosis not present

## 2013-10-04 DIAGNOSIS — J449 Chronic obstructive pulmonary disease, unspecified: Secondary | ICD-10-CM | POA: Diagnosis not present

## 2013-10-04 DIAGNOSIS — R0602 Shortness of breath: Secondary | ICD-10-CM | POA: Diagnosis not present

## 2013-10-05 DIAGNOSIS — M545 Low back pain, unspecified: Secondary | ICD-10-CM | POA: Diagnosis not present

## 2013-10-05 DIAGNOSIS — M25519 Pain in unspecified shoulder: Secondary | ICD-10-CM | POA: Diagnosis not present

## 2013-10-05 DIAGNOSIS — M5430 Sciatica, unspecified side: Secondary | ICD-10-CM | POA: Diagnosis not present

## 2013-10-05 DIAGNOSIS — M76899 Other specified enthesopathies of unspecified lower limb, excluding foot: Secondary | ICD-10-CM | POA: Diagnosis not present

## 2013-10-06 DIAGNOSIS — J84112 Idiopathic pulmonary fibrosis: Secondary | ICD-10-CM | POA: Diagnosis not present

## 2013-10-06 DIAGNOSIS — Z23 Encounter for immunization: Secondary | ICD-10-CM | POA: Diagnosis not present

## 2013-10-06 DIAGNOSIS — Z1342 Encounter for screening for global developmental delays (milestones): Secondary | ICD-10-CM | POA: Diagnosis not present

## 2013-10-06 DIAGNOSIS — E669 Obesity, unspecified: Secondary | ICD-10-CM | POA: Diagnosis not present

## 2013-10-06 DIAGNOSIS — I10 Essential (primary) hypertension: Secondary | ICD-10-CM | POA: Diagnosis not present

## 2013-10-06 DIAGNOSIS — Z1331 Encounter for screening for depression: Secondary | ICD-10-CM | POA: Diagnosis not present

## 2013-10-06 DIAGNOSIS — Z1330 Encounter for screening examination for mental health and behavioral disorders, unspecified: Secondary | ICD-10-CM | POA: Diagnosis not present

## 2013-10-11 DIAGNOSIS — M545 Low back pain, unspecified: Secondary | ICD-10-CM | POA: Diagnosis not present

## 2013-10-11 DIAGNOSIS — M5430 Sciatica, unspecified side: Secondary | ICD-10-CM | POA: Diagnosis not present

## 2013-10-11 DIAGNOSIS — M76899 Other specified enthesopathies of unspecified lower limb, excluding foot: Secondary | ICD-10-CM | POA: Diagnosis not present

## 2013-10-18 DIAGNOSIS — M545 Low back pain, unspecified: Secondary | ICD-10-CM | POA: Diagnosis not present

## 2013-10-18 DIAGNOSIS — M5430 Sciatica, unspecified side: Secondary | ICD-10-CM | POA: Diagnosis not present

## 2013-10-18 DIAGNOSIS — M76899 Other specified enthesopathies of unspecified lower limb, excluding foot: Secondary | ICD-10-CM | POA: Diagnosis not present

## 2013-10-20 DIAGNOSIS — L408 Other psoriasis: Secondary | ICD-10-CM | POA: Diagnosis not present

## 2013-10-20 DIAGNOSIS — L821 Other seborrheic keratosis: Secondary | ICD-10-CM | POA: Diagnosis not present

## 2013-10-20 DIAGNOSIS — D692 Other nonthrombocytopenic purpura: Secondary | ICD-10-CM | POA: Diagnosis not present

## 2013-10-20 DIAGNOSIS — L578 Other skin changes due to chronic exposure to nonionizing radiation: Secondary | ICD-10-CM | POA: Diagnosis not present

## 2013-10-25 DIAGNOSIS — R059 Cough, unspecified: Secondary | ICD-10-CM | POA: Diagnosis not present

## 2013-10-25 DIAGNOSIS — J449 Chronic obstructive pulmonary disease, unspecified: Secondary | ICD-10-CM | POA: Diagnosis not present

## 2013-10-25 DIAGNOSIS — R0902 Hypoxemia: Secondary | ICD-10-CM | POA: Diagnosis not present

## 2013-10-25 DIAGNOSIS — R05 Cough: Secondary | ICD-10-CM | POA: Diagnosis not present

## 2013-10-25 DIAGNOSIS — J8410 Pulmonary fibrosis, unspecified: Secondary | ICD-10-CM | POA: Diagnosis not present

## 2013-11-16 DIAGNOSIS — M76899 Other specified enthesopathies of unspecified lower limb, excluding foot: Secondary | ICD-10-CM | POA: Diagnosis not present

## 2013-11-19 DIAGNOSIS — M545 Low back pain, unspecified: Secondary | ICD-10-CM | POA: Diagnosis not present

## 2013-11-19 DIAGNOSIS — M76899 Other specified enthesopathies of unspecified lower limb, excluding foot: Secondary | ICD-10-CM | POA: Diagnosis not present

## 2013-11-22 DIAGNOSIS — M545 Low back pain, unspecified: Secondary | ICD-10-CM | POA: Diagnosis not present

## 2013-11-22 DIAGNOSIS — M76899 Other specified enthesopathies of unspecified lower limb, excluding foot: Secondary | ICD-10-CM | POA: Diagnosis not present

## 2013-11-23 DIAGNOSIS — M545 Low back pain, unspecified: Secondary | ICD-10-CM | POA: Diagnosis not present

## 2013-11-24 DIAGNOSIS — Z9981 Dependence on supplemental oxygen: Secondary | ICD-10-CM | POA: Diagnosis not present

## 2013-11-24 DIAGNOSIS — M545 Low back pain, unspecified: Secondary | ICD-10-CM | POA: Diagnosis not present

## 2013-11-24 DIAGNOSIS — IMO0001 Reserved for inherently not codable concepts without codable children: Secondary | ICD-10-CM | POA: Diagnosis not present

## 2013-11-24 DIAGNOSIS — M8448XA Pathological fracture, other site, initial encounter for fracture: Secondary | ICD-10-CM | POA: Diagnosis not present

## 2013-11-24 DIAGNOSIS — M810 Age-related osteoporosis without current pathological fracture: Secondary | ICD-10-CM | POA: Diagnosis not present

## 2013-11-24 DIAGNOSIS — I10 Essential (primary) hypertension: Secondary | ICD-10-CM | POA: Diagnosis not present

## 2013-11-24 DIAGNOSIS — R0602 Shortness of breath: Secondary | ICD-10-CM | POA: Diagnosis not present

## 2013-11-24 DIAGNOSIS — Z01818 Encounter for other preprocedural examination: Secondary | ICD-10-CM | POA: Diagnosis not present

## 2013-11-25 DIAGNOSIS — M8448XA Pathological fracture, other site, initial encounter for fracture: Secondary | ICD-10-CM | POA: Diagnosis not present

## 2013-11-25 DIAGNOSIS — I10 Essential (primary) hypertension: Secondary | ICD-10-CM | POA: Diagnosis not present

## 2013-11-25 DIAGNOSIS — IMO0001 Reserved for inherently not codable concepts without codable children: Secondary | ICD-10-CM | POA: Diagnosis not present

## 2013-11-25 DIAGNOSIS — M545 Low back pain, unspecified: Secondary | ICD-10-CM | POA: Diagnosis not present

## 2013-11-25 DIAGNOSIS — S32009A Unspecified fracture of unspecified lumbar vertebra, initial encounter for closed fracture: Secondary | ICD-10-CM | POA: Diagnosis not present

## 2013-11-25 DIAGNOSIS — M810 Age-related osteoporosis without current pathological fracture: Secondary | ICD-10-CM | POA: Diagnosis not present

## 2013-11-25 DIAGNOSIS — R918 Other nonspecific abnormal finding of lung field: Secondary | ICD-10-CM | POA: Diagnosis not present

## 2013-11-25 DIAGNOSIS — Z9981 Dependence on supplemental oxygen: Secondary | ICD-10-CM | POA: Diagnosis not present

## 2013-11-25 DIAGNOSIS — R0602 Shortness of breath: Secondary | ICD-10-CM | POA: Diagnosis not present

## 2013-11-29 DIAGNOSIS — M5137 Other intervertebral disc degeneration, lumbosacral region: Secondary | ICD-10-CM | POA: Diagnosis not present

## 2013-11-29 DIAGNOSIS — IMO0002 Reserved for concepts with insufficient information to code with codable children: Secondary | ICD-10-CM | POA: Diagnosis not present

## 2013-12-02 DIAGNOSIS — M5137 Other intervertebral disc degeneration, lumbosacral region: Secondary | ICD-10-CM | POA: Diagnosis not present

## 2013-12-02 DIAGNOSIS — IMO0002 Reserved for concepts with insufficient information to code with codable children: Secondary | ICD-10-CM | POA: Diagnosis not present

## 2013-12-02 DIAGNOSIS — M25559 Pain in unspecified hip: Secondary | ICD-10-CM | POA: Diagnosis not present

## 2013-12-10 ENCOUNTER — Ambulatory Visit: Payer: Self-pay | Admitting: Family Medicine

## 2013-12-10 DIAGNOSIS — Z1212 Encounter for screening for malignant neoplasm of rectum: Secondary | ICD-10-CM | POA: Diagnosis not present

## 2013-12-10 DIAGNOSIS — S32009A Unspecified fracture of unspecified lumbar vertebra, initial encounter for closed fracture: Secondary | ICD-10-CM | POA: Diagnosis not present

## 2013-12-10 DIAGNOSIS — K5900 Constipation, unspecified: Secondary | ICD-10-CM | POA: Diagnosis not present

## 2013-12-10 DIAGNOSIS — Z1330 Encounter for screening examination for mental health and behavioral disorders, unspecified: Secondary | ICD-10-CM | POA: Diagnosis not present

## 2013-12-10 DIAGNOSIS — R109 Unspecified abdominal pain: Secondary | ICD-10-CM | POA: Diagnosis not present

## 2013-12-10 DIAGNOSIS — R32 Unspecified urinary incontinence: Secondary | ICD-10-CM | POA: Diagnosis not present

## 2013-12-10 DIAGNOSIS — Z23 Encounter for immunization: Secondary | ICD-10-CM | POA: Diagnosis not present

## 2013-12-10 DIAGNOSIS — Z1331 Encounter for screening for depression: Secondary | ICD-10-CM | POA: Diagnosis not present

## 2013-12-10 DIAGNOSIS — Z1342 Encounter for screening for global developmental delays (milestones): Secondary | ICD-10-CM | POA: Diagnosis not present

## 2013-12-13 DIAGNOSIS — M5137 Other intervertebral disc degeneration, lumbosacral region: Secondary | ICD-10-CM | POA: Diagnosis not present

## 2013-12-13 DIAGNOSIS — IMO0002 Reserved for concepts with insufficient information to code with codable children: Secondary | ICD-10-CM | POA: Diagnosis not present

## 2013-12-20 DIAGNOSIS — L0291 Cutaneous abscess, unspecified: Secondary | ICD-10-CM | POA: Diagnosis not present

## 2013-12-20 DIAGNOSIS — J84112 Idiopathic pulmonary fibrosis: Secondary | ICD-10-CM | POA: Diagnosis not present

## 2013-12-20 DIAGNOSIS — Z1331 Encounter for screening for depression: Secondary | ICD-10-CM | POA: Diagnosis not present

## 2013-12-20 DIAGNOSIS — I10 Essential (primary) hypertension: Secondary | ICD-10-CM | POA: Diagnosis not present

## 2013-12-20 DIAGNOSIS — Z1212 Encounter for screening for malignant neoplasm of rectum: Secondary | ICD-10-CM | POA: Diagnosis not present

## 2013-12-20 DIAGNOSIS — Z1330 Encounter for screening examination for mental health and behavioral disorders, unspecified: Secondary | ICD-10-CM | POA: Diagnosis not present

## 2013-12-20 DIAGNOSIS — K5900 Constipation, unspecified: Secondary | ICD-10-CM | POA: Diagnosis not present

## 2013-12-20 DIAGNOSIS — L0390 Cellulitis, unspecified: Secondary | ICD-10-CM | POA: Diagnosis not present

## 2013-12-22 DIAGNOSIS — IMO0002 Reserved for concepts with insufficient information to code with codable children: Secondary | ICD-10-CM | POA: Diagnosis not present

## 2013-12-22 DIAGNOSIS — D692 Other nonthrombocytopenic purpura: Secondary | ICD-10-CM | POA: Diagnosis not present

## 2013-12-23 DIAGNOSIS — D692 Other nonthrombocytopenic purpura: Secondary | ICD-10-CM | POA: Diagnosis not present

## 2013-12-23 DIAGNOSIS — G4733 Obstructive sleep apnea (adult) (pediatric): Secondary | ICD-10-CM | POA: Diagnosis not present

## 2013-12-23 DIAGNOSIS — M545 Low back pain, unspecified: Secondary | ICD-10-CM | POA: Diagnosis not present

## 2013-12-23 DIAGNOSIS — M5126 Other intervertebral disc displacement, lumbar region: Secondary | ICD-10-CM | POA: Diagnosis not present

## 2013-12-23 DIAGNOSIS — Z01810 Encounter for preprocedural cardiovascular examination: Secondary | ICD-10-CM | POA: Diagnosis not present

## 2013-12-23 DIAGNOSIS — I10 Essential (primary) hypertension: Secondary | ICD-10-CM | POA: Diagnosis not present

## 2013-12-23 DIAGNOSIS — IMO0002 Reserved for concepts with insufficient information to code with codable children: Secondary | ICD-10-CM | POA: Diagnosis not present

## 2013-12-23 DIAGNOSIS — K219 Gastro-esophageal reflux disease without esophagitis: Secondary | ICD-10-CM | POA: Diagnosis not present

## 2013-12-27 DIAGNOSIS — I10 Essential (primary) hypertension: Secondary | ICD-10-CM | POA: Diagnosis not present

## 2013-12-27 DIAGNOSIS — M545 Low back pain, unspecified: Secondary | ICD-10-CM | POA: Diagnosis not present

## 2013-12-27 DIAGNOSIS — IMO0002 Reserved for concepts with insufficient information to code with codable children: Secondary | ICD-10-CM | POA: Diagnosis not present

## 2013-12-27 DIAGNOSIS — M5126 Other intervertebral disc displacement, lumbar region: Secondary | ICD-10-CM | POA: Diagnosis not present

## 2013-12-27 DIAGNOSIS — D692 Other nonthrombocytopenic purpura: Secondary | ICD-10-CM | POA: Diagnosis not present

## 2013-12-27 DIAGNOSIS — G4733 Obstructive sleep apnea (adult) (pediatric): Secondary | ICD-10-CM | POA: Diagnosis not present

## 2013-12-27 DIAGNOSIS — K219 Gastro-esophageal reflux disease without esophagitis: Secondary | ICD-10-CM | POA: Diagnosis not present

## 2013-12-28 DIAGNOSIS — M545 Low back pain, unspecified: Secondary | ICD-10-CM | POA: Diagnosis not present

## 2013-12-28 DIAGNOSIS — K219 Gastro-esophageal reflux disease without esophagitis: Secondary | ICD-10-CM | POA: Diagnosis not present

## 2013-12-28 DIAGNOSIS — G4733 Obstructive sleep apnea (adult) (pediatric): Secondary | ICD-10-CM | POA: Diagnosis not present

## 2013-12-28 DIAGNOSIS — IMO0002 Reserved for concepts with insufficient information to code with codable children: Secondary | ICD-10-CM | POA: Diagnosis not present

## 2013-12-28 DIAGNOSIS — D692 Other nonthrombocytopenic purpura: Secondary | ICD-10-CM | POA: Diagnosis not present

## 2013-12-28 DIAGNOSIS — M5126 Other intervertebral disc displacement, lumbar region: Secondary | ICD-10-CM | POA: Diagnosis not present

## 2014-01-11 DIAGNOSIS — M5126 Other intervertebral disc displacement, lumbar region: Secondary | ICD-10-CM | POA: Diagnosis not present

## 2014-01-18 DIAGNOSIS — H25019 Cortical age-related cataract, unspecified eye: Secondary | ICD-10-CM | POA: Diagnosis not present

## 2014-01-26 ENCOUNTER — Ambulatory Visit: Payer: Self-pay | Admitting: Family Medicine

## 2014-01-26 DIAGNOSIS — Z1231 Encounter for screening mammogram for malignant neoplasm of breast: Secondary | ICD-10-CM | POA: Diagnosis not present

## 2014-02-08 ENCOUNTER — Observation Stay: Payer: Self-pay | Admitting: Internal Medicine

## 2014-02-08 DIAGNOSIS — M549 Dorsalgia, unspecified: Secondary | ICD-10-CM | POA: Diagnosis not present

## 2014-02-08 DIAGNOSIS — G459 Transient cerebral ischemic attack, unspecified: Secondary | ICD-10-CM | POA: Diagnosis not present

## 2014-02-08 DIAGNOSIS — J449 Chronic obstructive pulmonary disease, unspecified: Secondary | ICD-10-CM | POA: Diagnosis not present

## 2014-02-08 DIAGNOSIS — Z79899 Other long term (current) drug therapy: Secondary | ICD-10-CM | POA: Diagnosis not present

## 2014-02-08 DIAGNOSIS — I6529 Occlusion and stenosis of unspecified carotid artery: Secondary | ICD-10-CM | POA: Diagnosis not present

## 2014-02-08 DIAGNOSIS — G9341 Metabolic encephalopathy: Secondary | ICD-10-CM | POA: Diagnosis not present

## 2014-02-08 DIAGNOSIS — Z7982 Long term (current) use of aspirin: Secondary | ICD-10-CM | POA: Diagnosis not present

## 2014-02-08 DIAGNOSIS — N390 Urinary tract infection, site not specified: Secondary | ICD-10-CM | POA: Diagnosis not present

## 2014-02-08 DIAGNOSIS — M25519 Pain in unspecified shoulder: Secondary | ICD-10-CM | POA: Diagnosis not present

## 2014-02-08 DIAGNOSIS — F29 Unspecified psychosis not due to a substance or known physiological condition: Secondary | ICD-10-CM | POA: Diagnosis not present

## 2014-02-08 DIAGNOSIS — F411 Generalized anxiety disorder: Secondary | ICD-10-CM | POA: Diagnosis not present

## 2014-02-08 DIAGNOSIS — E876 Hypokalemia: Secondary | ICD-10-CM | POA: Diagnosis not present

## 2014-02-08 DIAGNOSIS — I658 Occlusion and stenosis of other precerebral arteries: Secondary | ICD-10-CM | POA: Diagnosis not present

## 2014-02-08 DIAGNOSIS — R4182 Altered mental status, unspecified: Secondary | ICD-10-CM | POA: Diagnosis not present

## 2014-02-08 DIAGNOSIS — I10 Essential (primary) hypertension: Secondary | ICD-10-CM | POA: Diagnosis not present

## 2014-02-08 DIAGNOSIS — G8929 Other chronic pain: Secondary | ICD-10-CM | POA: Diagnosis not present

## 2014-02-08 LAB — URINALYSIS, COMPLETE
Bacteria: NONE SEEN
Bilirubin,UR: NEGATIVE
Blood: NEGATIVE
Glucose,UR: NEGATIVE mg/dL (ref 0–75)
Nitrite: NEGATIVE
Ph: 6 (ref 4.5–8.0)
Protein: NEGATIVE
RBC,UR: 2 /HPF (ref 0–5)
Specific Gravity: 1.023 (ref 1.003–1.030)
Squamous Epithelial: 3
WBC UR: 83 /HPF (ref 0–5)

## 2014-02-08 LAB — COMPREHENSIVE METABOLIC PANEL
Albumin: 3.2 g/dL — ABNORMAL LOW (ref 3.4–5.0)
Alkaline Phosphatase: 101 U/L
Anion Gap: 8 (ref 7–16)
BUN: 17 mg/dL (ref 7–18)
Bilirubin,Total: 0.7 mg/dL (ref 0.2–1.0)
Calcium, Total: 8.9 mg/dL (ref 8.5–10.1)
Chloride: 110 mmol/L — ABNORMAL HIGH (ref 98–107)
Co2: 23 mmol/L (ref 21–32)
Creatinine: 0.72 mg/dL (ref 0.60–1.30)
EGFR (African American): >60
EGFR (Non-African Amer.): >60
Glucose: 82 mg/dL (ref 65–99)
Osmolality: 282 (ref 275–301)
Potassium: 3.8 mmol/L (ref 3.5–5.1)
SGOT(AST): 35 U/L (ref 15–37)
SGPT (ALT): 23 U/L (ref 12–78)
Sodium: 141 mmol/L (ref 136–145)
Total Protein: 6.7 g/dL (ref 6.4–8.2)

## 2014-02-08 LAB — CBC WITH DIFFERENTIAL/PLATELET
Basophil #: 0.1 10*3/uL (ref 0.0–0.1)
Basophil %: 1 %
Eosinophil #: 0.3 10*3/uL (ref 0.0–0.7)
Eosinophil %: 3.1 %
HCT: 42.9 % (ref 35.0–47.0)
HGB: 13.8 g/dL (ref 12.0–16.0)
Lymphocyte #: 1.8 10*3/uL (ref 1.0–3.6)
Lymphocyte %: 16.3 %
MCH: 29.9 pg (ref 26.0–34.0)
MCHC: 32.2 g/dL (ref 32.0–36.0)
MCV: 93 fL (ref 80–100)
Monocyte #: 1.2 x10 3/mm — ABNORMAL HIGH (ref 0.2–0.9)
Monocyte %: 11.2 %
Neutrophil #: 7.4 10*3/uL — ABNORMAL HIGH (ref 1.4–6.5)
Neutrophil %: 68.4 %
Platelet: 262 10*3/uL (ref 150–440)
RBC: 4.62 10*6/uL (ref 3.80–5.20)
RDW: 14 % (ref 11.5–14.5)
WBC: 10.8 10*3/uL (ref 3.6–11.0)

## 2014-02-08 LAB — TROPONIN I: Troponin-I: <0.02 ng/mL

## 2014-02-09 DIAGNOSIS — G9341 Metabolic encephalopathy: Secondary | ICD-10-CM | POA: Diagnosis not present

## 2014-02-09 DIAGNOSIS — M549 Dorsalgia, unspecified: Secondary | ICD-10-CM | POA: Diagnosis not present

## 2014-02-09 DIAGNOSIS — N390 Urinary tract infection, site not specified: Secondary | ICD-10-CM | POA: Diagnosis not present

## 2014-02-09 DIAGNOSIS — I10 Essential (primary) hypertension: Secondary | ICD-10-CM | POA: Diagnosis not present

## 2014-02-09 DIAGNOSIS — I517 Cardiomegaly: Secondary | ICD-10-CM | POA: Diagnosis not present

## 2014-02-09 LAB — BASIC METABOLIC PANEL
Anion Gap: 6 — ABNORMAL LOW (ref 7–16)
BUN: 11 mg/dL (ref 7–18)
Calcium, Total: 8.8 mg/dL (ref 8.5–10.1)
Chloride: 106 mmol/L (ref 98–107)
Co2: 27 mmol/L (ref 21–32)
Creatinine: 0.78 mg/dL (ref 0.60–1.30)
EGFR (African American): >60
EGFR (Non-African Amer.): >60
Glucose: 85 mg/dL (ref 65–99)
Osmolality: 276 (ref 275–301)
Potassium: 3.2 mmol/L — ABNORMAL LOW (ref 3.5–5.1)
Sodium: 139 mmol/L (ref 136–145)

## 2014-02-09 LAB — CBC WITH DIFFERENTIAL/PLATELET
Basophil #: 0.1 10*3/uL (ref 0.0–0.1)
Basophil %: 0.9 %
Eosinophil #: 0.5 10*3/uL (ref 0.0–0.7)
Eosinophil %: 4.5 %
HCT: 39.1 % (ref 35.0–47.0)
HGB: 12.9 g/dL (ref 12.0–16.0)
Lymphocyte #: 1.6 10*3/uL (ref 1.0–3.6)
Lymphocyte %: 15.6 %
MCH: 30.4 pg (ref 26.0–34.0)
MCHC: 33.1 g/dL (ref 32.0–36.0)
MCV: 92 fL (ref 80–100)
Monocyte #: 1.1 x10 3/mm — ABNORMAL HIGH (ref 0.2–0.9)
Monocyte %: 10.6 %
Neutrophil #: 6.9 10*3/uL — ABNORMAL HIGH (ref 1.4–6.5)
Neutrophil %: 68.4 %
Platelet: 252 10*3/uL (ref 150–440)
RBC: 4.26 10*6/uL (ref 3.80–5.20)
RDW: 14 % (ref 11.5–14.5)
WBC: 10 10*3/uL (ref 3.6–11.0)

## 2014-02-09 LAB — TSH: Thyroid Stimulating Horm: 3.88 u[IU]/mL

## 2014-02-09 LAB — LIPID PANEL
Cholesterol: 156 mg/dL (ref 0–200)
HDL Cholesterol: 48 mg/dL (ref 40–60)
Ldl Cholesterol, Calc: 84 mg/dL (ref 0–100)
Triglycerides: 121 mg/dL (ref 0–200)
VLDL Cholesterol, Calc: 24 mg/dL (ref 5–40)

## 2014-02-09 LAB — HEMOGLOBIN A1C: Hemoglobin A1C: 6.1 % (ref 4.2–6.3)

## 2014-02-16 DIAGNOSIS — Z1330 Encounter for screening examination for mental health and behavioral disorders, unspecified: Secondary | ICD-10-CM | POA: Diagnosis not present

## 2014-02-16 DIAGNOSIS — R5383 Other fatigue: Secondary | ICD-10-CM | POA: Diagnosis not present

## 2014-02-16 DIAGNOSIS — J84112 Idiopathic pulmonary fibrosis: Secondary | ICD-10-CM | POA: Diagnosis not present

## 2014-02-16 DIAGNOSIS — Z1342 Encounter for screening for global developmental delays (milestones): Secondary | ICD-10-CM | POA: Diagnosis not present

## 2014-02-16 DIAGNOSIS — Z1331 Encounter for screening for depression: Secondary | ICD-10-CM | POA: Diagnosis not present

## 2014-02-16 DIAGNOSIS — K5900 Constipation, unspecified: Secondary | ICD-10-CM | POA: Diagnosis not present

## 2014-02-16 DIAGNOSIS — IMO0002 Reserved for concepts with insufficient information to code with codable children: Secondary | ICD-10-CM | POA: Diagnosis not present

## 2014-02-16 DIAGNOSIS — R5381 Other malaise: Secondary | ICD-10-CM | POA: Diagnosis not present

## 2014-02-16 DIAGNOSIS — Z1212 Encounter for screening for malignant neoplasm of rectum: Secondary | ICD-10-CM | POA: Diagnosis not present

## 2014-02-22 DIAGNOSIS — M25559 Pain in unspecified hip: Secondary | ICD-10-CM | POA: Diagnosis not present

## 2014-02-22 DIAGNOSIS — M76899 Other specified enthesopathies of unspecified lower limb, excluding foot: Secondary | ICD-10-CM | POA: Diagnosis not present

## 2014-02-22 DIAGNOSIS — M5126 Other intervertebral disc displacement, lumbar region: Secondary | ICD-10-CM | POA: Diagnosis not present

## 2014-03-07 DIAGNOSIS — Z1330 Encounter for screening examination for mental health and behavioral disorders, unspecified: Secondary | ICD-10-CM | POA: Diagnosis not present

## 2014-03-07 DIAGNOSIS — Z1212 Encounter for screening for malignant neoplasm of rectum: Secondary | ICD-10-CM | POA: Diagnosis not present

## 2014-03-07 DIAGNOSIS — Z1331 Encounter for screening for depression: Secondary | ICD-10-CM | POA: Diagnosis not present

## 2014-03-07 DIAGNOSIS — K5900 Constipation, unspecified: Secondary | ICD-10-CM | POA: Diagnosis not present

## 2014-03-07 DIAGNOSIS — R1032 Left lower quadrant pain: Secondary | ICD-10-CM | POA: Diagnosis not present

## 2014-03-07 DIAGNOSIS — J84112 Idiopathic pulmonary fibrosis: Secondary | ICD-10-CM | POA: Diagnosis not present

## 2014-03-07 DIAGNOSIS — Z1342 Encounter for screening for global developmental delays (milestones): Secondary | ICD-10-CM | POA: Diagnosis not present

## 2014-03-07 DIAGNOSIS — R5381 Other malaise: Secondary | ICD-10-CM | POA: Diagnosis not present

## 2014-03-07 DIAGNOSIS — R5383 Other fatigue: Secondary | ICD-10-CM | POA: Diagnosis not present

## 2014-03-09 DIAGNOSIS — M76899 Other specified enthesopathies of unspecified lower limb, excluding foot: Secondary | ICD-10-CM | POA: Diagnosis not present

## 2014-03-09 DIAGNOSIS — M461 Sacroiliitis, not elsewhere classified: Secondary | ICD-10-CM | POA: Diagnosis not present

## 2014-03-25 ENCOUNTER — Ambulatory Visit: Payer: Self-pay

## 2014-03-25 ENCOUNTER — Ambulatory Visit (INDEPENDENT_AMBULATORY_CARE_PROVIDER_SITE_OTHER): Payer: Medicare Other

## 2014-03-25 ENCOUNTER — Ambulatory Visit (INDEPENDENT_AMBULATORY_CARE_PROVIDER_SITE_OTHER): Payer: Medicare Other | Admitting: Podiatry

## 2014-03-25 ENCOUNTER — Other Ambulatory Visit: Payer: Self-pay | Admitting: *Deleted

## 2014-03-25 ENCOUNTER — Encounter: Payer: Self-pay | Admitting: Podiatry

## 2014-03-25 VITALS — BP 129/70 | HR 66 | Resp 16 | Ht 61.0 in | Wt 159.0 lb

## 2014-03-25 DIAGNOSIS — M722 Plantar fascial fibromatosis: Secondary | ICD-10-CM

## 2014-03-25 DIAGNOSIS — M898X9 Other specified disorders of bone, unspecified site: Secondary | ICD-10-CM | POA: Diagnosis not present

## 2014-03-25 MED ORDER — TRIAMCINOLONE ACETONIDE 10 MG/ML IJ SUSP
10.0000 mg | Freq: Once | INTRAMUSCULAR | Status: AC
  Administered 2014-03-25: 10 mg

## 2014-03-25 NOTE — Progress Notes (Signed)
Left foot pain along the lateral side and bottom , and two toes are numb,more asleep

## 2014-03-26 NOTE — Progress Notes (Signed)
Subjective:     Patient ID: [name], female   DOB: Jul 18, 1936, 78 y.o.   MRN: [id number]  HPI patient has developed pain in the mid arch area left and states that it's bad when she tries to get up in the morning or after periods of sitting   Review of Systems     Objective:   Physical Exam Neurovascular status intact with discomfort in the mid arch area left with inflammation and discomfort lateral foot which is probably from walking differently    Assessment:     Plantar fasciitis with inflammatory changes    Plan:     H&P and x-rays reviewed with patient and injected the mid arch area 3 mg Kenalog 5 mg Xylocaine Marcaine mixture and advised him reduced activity supportive shoes and physical therapy

## 2014-03-29 DIAGNOSIS — M461 Sacroiliitis, not elsewhere classified: Secondary | ICD-10-CM | POA: Diagnosis not present

## 2014-03-29 DIAGNOSIS — M76899 Other specified enthesopathies of unspecified lower limb, excluding foot: Secondary | ICD-10-CM | POA: Diagnosis not present

## 2014-03-29 DIAGNOSIS — M5126 Other intervertebral disc displacement, lumbar region: Secondary | ICD-10-CM | POA: Diagnosis not present

## 2014-03-29 DIAGNOSIS — Z5181 Encounter for therapeutic drug level monitoring: Secondary | ICD-10-CM | POA: Diagnosis not present

## 2014-03-29 DIAGNOSIS — M5137 Other intervertebral disc degeneration, lumbosacral region: Secondary | ICD-10-CM | POA: Diagnosis not present

## 2014-03-29 DIAGNOSIS — Z79899 Other long term (current) drug therapy: Secondary | ICD-10-CM | POA: Diagnosis not present

## 2014-04-02 DIAGNOSIS — M461 Sacroiliitis, not elsewhere classified: Secondary | ICD-10-CM | POA: Diagnosis not present

## 2014-04-18 DIAGNOSIS — Z1331 Encounter for screening for depression: Secondary | ICD-10-CM | POA: Diagnosis not present

## 2014-04-18 DIAGNOSIS — I10 Essential (primary) hypertension: Secondary | ICD-10-CM | POA: Diagnosis not present

## 2014-04-18 DIAGNOSIS — R413 Other amnesia: Secondary | ICD-10-CM | POA: Diagnosis not present

## 2014-04-18 DIAGNOSIS — Z1330 Encounter for screening examination for mental health and behavioral disorders, unspecified: Secondary | ICD-10-CM | POA: Diagnosis not present

## 2014-04-18 DIAGNOSIS — Z23 Encounter for immunization: Secondary | ICD-10-CM | POA: Diagnosis not present

## 2014-04-18 DIAGNOSIS — G4730 Sleep apnea, unspecified: Secondary | ICD-10-CM | POA: Diagnosis not present

## 2014-04-18 DIAGNOSIS — Z1212 Encounter for screening for malignant neoplasm of rectum: Secondary | ICD-10-CM | POA: Diagnosis not present

## 2014-04-18 DIAGNOSIS — Z0000 Encounter for general adult medical examination without abnormal findings: Secondary | ICD-10-CM | POA: Diagnosis not present

## 2014-04-20 DIAGNOSIS — M810 Age-related osteoporosis without current pathological fracture: Secondary | ICD-10-CM | POA: Diagnosis not present

## 2014-04-21 DIAGNOSIS — L821 Other seborrheic keratosis: Secondary | ICD-10-CM | POA: Diagnosis not present

## 2014-04-21 DIAGNOSIS — D692 Other nonthrombocytopenic purpura: Secondary | ICD-10-CM | POA: Diagnosis not present

## 2014-04-21 DIAGNOSIS — L819 Disorder of pigmentation, unspecified: Secondary | ICD-10-CM | POA: Diagnosis not present

## 2014-04-21 DIAGNOSIS — L408 Other psoriasis: Secondary | ICD-10-CM | POA: Diagnosis not present

## 2014-04-29 DIAGNOSIS — G3184 Mild cognitive impairment, so stated: Secondary | ICD-10-CM | POA: Diagnosis not present

## 2014-05-03 DIAGNOSIS — M461 Sacroiliitis, not elsewhere classified: Secondary | ICD-10-CM | POA: Diagnosis not present

## 2014-05-04 DIAGNOSIS — R0989 Other specified symptoms and signs involving the circulatory and respiratory systems: Secondary | ICD-10-CM | POA: Diagnosis not present

## 2014-05-04 DIAGNOSIS — G4733 Obstructive sleep apnea (adult) (pediatric): Secondary | ICD-10-CM | POA: Diagnosis not present

## 2014-05-04 DIAGNOSIS — J8410 Pulmonary fibrosis, unspecified: Secondary | ICD-10-CM | POA: Diagnosis not present

## 2014-05-04 DIAGNOSIS — R0609 Other forms of dyspnea: Secondary | ICD-10-CM | POA: Diagnosis not present

## 2014-05-04 DIAGNOSIS — R0902 Hypoxemia: Secondary | ICD-10-CM | POA: Diagnosis not present

## 2014-05-17 DIAGNOSIS — M25559 Pain in unspecified hip: Secondary | ICD-10-CM | POA: Diagnosis not present

## 2014-05-17 DIAGNOSIS — M5137 Other intervertebral disc degeneration, lumbosacral region: Secondary | ICD-10-CM | POA: Diagnosis not present

## 2014-05-23 DIAGNOSIS — L821 Other seborrheic keratosis: Secondary | ICD-10-CM | POA: Diagnosis not present

## 2014-05-23 DIAGNOSIS — L408 Other psoriasis: Secondary | ICD-10-CM | POA: Diagnosis not present

## 2014-06-01 DIAGNOSIS — Z1331 Encounter for screening for depression: Secondary | ICD-10-CM | POA: Diagnosis not present

## 2014-06-01 DIAGNOSIS — Z1212 Encounter for screening for malignant neoplasm of rectum: Secondary | ICD-10-CM | POA: Diagnosis not present

## 2014-06-01 DIAGNOSIS — Z1330 Encounter for screening examination for mental health and behavioral disorders, unspecified: Secondary | ICD-10-CM | POA: Diagnosis not present

## 2014-06-01 DIAGNOSIS — I10 Essential (primary) hypertension: Secondary | ICD-10-CM | POA: Diagnosis not present

## 2014-06-01 DIAGNOSIS — Z1342 Encounter for screening for global developmental delays (milestones): Secondary | ICD-10-CM | POA: Diagnosis not present

## 2014-06-01 DIAGNOSIS — Z6830 Body mass index (BMI) 30.0-30.9, adult: Secondary | ICD-10-CM | POA: Diagnosis not present

## 2014-06-01 DIAGNOSIS — Z0000 Encounter for general adult medical examination without abnormal findings: Secondary | ICD-10-CM | POA: Diagnosis not present

## 2014-06-01 DIAGNOSIS — R413 Other amnesia: Secondary | ICD-10-CM | POA: Diagnosis not present

## 2014-06-01 DIAGNOSIS — Z23 Encounter for immunization: Secondary | ICD-10-CM | POA: Diagnosis not present

## 2014-06-08 DIAGNOSIS — M47816 Spondylosis without myelopathy or radiculopathy, lumbar region: Secondary | ICD-10-CM | POA: Diagnosis not present

## 2014-06-14 DIAGNOSIS — E669 Obesity, unspecified: Secondary | ICD-10-CM | POA: Diagnosis not present

## 2014-06-14 DIAGNOSIS — M1990 Unspecified osteoarthritis, unspecified site: Secondary | ICD-10-CM | POA: Diagnosis not present

## 2014-06-14 DIAGNOSIS — J45909 Unspecified asthma, uncomplicated: Secondary | ICD-10-CM | POA: Diagnosis not present

## 2014-06-14 DIAGNOSIS — R0602 Shortness of breath: Secondary | ICD-10-CM | POA: Diagnosis not present

## 2014-07-04 DIAGNOSIS — M5137 Other intervertebral disc degeneration, lumbosacral region: Secondary | ICD-10-CM | POA: Diagnosis not present

## 2014-08-06 DIAGNOSIS — M25551 Pain in right hip: Secondary | ICD-10-CM | POA: Diagnosis not present

## 2014-08-09 DIAGNOSIS — M461 Sacroiliitis, not elsewhere classified: Secondary | ICD-10-CM | POA: Diagnosis not present

## 2014-08-09 DIAGNOSIS — M4806 Spinal stenosis, lumbar region: Secondary | ICD-10-CM | POA: Diagnosis not present

## 2014-08-09 DIAGNOSIS — M7061 Trochanteric bursitis, right hip: Secondary | ICD-10-CM | POA: Diagnosis not present

## 2014-08-17 DIAGNOSIS — M47816 Spondylosis without myelopathy or radiculopathy, lumbar region: Secondary | ICD-10-CM | POA: Diagnosis not present

## 2014-08-17 DIAGNOSIS — M461 Sacroiliitis, not elsewhere classified: Secondary | ICD-10-CM | POA: Diagnosis not present

## 2014-08-18 DIAGNOSIS — G4733 Obstructive sleep apnea (adult) (pediatric): Secondary | ICD-10-CM | POA: Diagnosis not present

## 2014-08-18 DIAGNOSIS — J8410 Pulmonary fibrosis, unspecified: Secondary | ICD-10-CM | POA: Diagnosis not present

## 2014-08-18 DIAGNOSIS — E663 Overweight: Secondary | ICD-10-CM | POA: Diagnosis not present

## 2014-09-16 DIAGNOSIS — M47896 Other spondylosis, lumbar region: Secondary | ICD-10-CM | POA: Diagnosis not present

## 2014-09-16 DIAGNOSIS — M5126 Other intervertebral disc displacement, lumbar region: Secondary | ICD-10-CM | POA: Diagnosis not present

## 2014-09-16 DIAGNOSIS — M4806 Spinal stenosis, lumbar region: Secondary | ICD-10-CM | POA: Diagnosis not present

## 2014-09-16 DIAGNOSIS — M5137 Other intervertebral disc degeneration, lumbosacral region: Secondary | ICD-10-CM | POA: Diagnosis not present

## 2014-09-16 DIAGNOSIS — M5116 Intervertebral disc disorders with radiculopathy, lumbar region: Secondary | ICD-10-CM | POA: Diagnosis not present

## 2014-09-16 DIAGNOSIS — M461 Sacroiliitis, not elsewhere classified: Secondary | ICD-10-CM | POA: Diagnosis not present

## 2014-10-05 DIAGNOSIS — M791 Myalgia: Secondary | ICD-10-CM | POA: Diagnosis not present

## 2014-10-24 DIAGNOSIS — M810 Age-related osteoporosis without current pathological fracture: Secondary | ICD-10-CM | POA: Diagnosis not present

## 2014-10-27 DIAGNOSIS — G3184 Mild cognitive impairment, so stated: Secondary | ICD-10-CM | POA: Diagnosis not present

## 2014-11-17 DIAGNOSIS — M5416 Radiculopathy, lumbar region: Secondary | ICD-10-CM | POA: Diagnosis not present

## 2014-11-17 DIAGNOSIS — Z9889 Other specified postprocedural states: Secondary | ICD-10-CM | POA: Diagnosis not present

## 2014-11-24 DIAGNOSIS — M4806 Spinal stenosis, lumbar region: Secondary | ICD-10-CM | POA: Diagnosis not present

## 2014-11-24 DIAGNOSIS — M5137 Other intervertebral disc degeneration, lumbosacral region: Secondary | ICD-10-CM | POA: Diagnosis not present

## 2014-11-24 DIAGNOSIS — M47896 Other spondylosis, lumbar region: Secondary | ICD-10-CM | POA: Diagnosis not present

## 2014-11-24 DIAGNOSIS — M5116 Intervertebral disc disorders with radiculopathy, lumbar region: Secondary | ICD-10-CM | POA: Diagnosis not present

## 2014-11-24 DIAGNOSIS — K5909 Other constipation: Secondary | ICD-10-CM | POA: Diagnosis not present

## 2014-11-24 DIAGNOSIS — M461 Sacroiliitis, not elsewhere classified: Secondary | ICD-10-CM | POA: Diagnosis not present

## 2014-11-24 DIAGNOSIS — M7061 Trochanteric bursitis, right hip: Secondary | ICD-10-CM | POA: Diagnosis not present

## 2014-11-24 DIAGNOSIS — M5126 Other intervertebral disc displacement, lumbar region: Secondary | ICD-10-CM | POA: Diagnosis not present

## 2014-11-24 DIAGNOSIS — M791 Myalgia: Secondary | ICD-10-CM | POA: Diagnosis not present

## 2014-11-28 DIAGNOSIS — N810 Urethrocele: Secondary | ICD-10-CM | POA: Diagnosis not present

## 2014-11-28 DIAGNOSIS — N3090 Cystitis, unspecified without hematuria: Secondary | ICD-10-CM | POA: Diagnosis not present

## 2014-11-28 DIAGNOSIS — K5900 Constipation, unspecified: Secondary | ICD-10-CM | POA: Diagnosis not present

## 2014-12-07 DIAGNOSIS — M5126 Other intervertebral disc displacement, lumbar region: Secondary | ICD-10-CM | POA: Diagnosis not present

## 2014-12-07 DIAGNOSIS — M791 Myalgia: Secondary | ICD-10-CM | POA: Diagnosis not present

## 2014-12-07 DIAGNOSIS — M461 Sacroiliitis, not elsewhere classified: Secondary | ICD-10-CM | POA: Diagnosis not present

## 2014-12-07 DIAGNOSIS — M7061 Trochanteric bursitis, right hip: Secondary | ICD-10-CM | POA: Diagnosis not present

## 2014-12-07 DIAGNOSIS — M5137 Other intervertebral disc degeneration, lumbosacral region: Secondary | ICD-10-CM | POA: Diagnosis not present

## 2014-12-07 DIAGNOSIS — M4806 Spinal stenosis, lumbar region: Secondary | ICD-10-CM | POA: Diagnosis not present

## 2014-12-07 DIAGNOSIS — M47896 Other spondylosis, lumbar region: Secondary | ICD-10-CM | POA: Diagnosis not present

## 2014-12-07 DIAGNOSIS — M5116 Intervertebral disc disorders with radiculopathy, lumbar region: Secondary | ICD-10-CM | POA: Diagnosis not present

## 2014-12-07 DIAGNOSIS — K5909 Other constipation: Secondary | ICD-10-CM | POA: Diagnosis not present

## 2014-12-12 DIAGNOSIS — G4733 Obstructive sleep apnea (adult) (pediatric): Secondary | ICD-10-CM | POA: Diagnosis not present

## 2014-12-12 DIAGNOSIS — J8410 Pulmonary fibrosis, unspecified: Secondary | ICD-10-CM | POA: Diagnosis not present

## 2014-12-12 DIAGNOSIS — R0602 Shortness of breath: Secondary | ICD-10-CM | POA: Diagnosis not present

## 2014-12-12 DIAGNOSIS — M549 Dorsalgia, unspecified: Secondary | ICD-10-CM | POA: Diagnosis not present

## 2014-12-22 DIAGNOSIS — E2749 Other adrenocortical insufficiency: Secondary | ICD-10-CM | POA: Diagnosis not present

## 2014-12-22 DIAGNOSIS — M5137 Other intervertebral disc degeneration, lumbosacral region: Secondary | ICD-10-CM | POA: Diagnosis not present

## 2014-12-24 NOTE — Discharge Summary (Signed)
PATIENT NAME:  [name], [name] MR#:  [id number] DATE OF BIRTH:  Dec 31, 1935  DATE OF ADMISSION:  02/08/2014 DATE OF DISCHARGE:  02/09/2014  ADMITTING PHYSICIAN: Dr. Laurin Coder.  DISCHARGING PHYSICIAN: Dr. Tressia Miners.  PRIMARY CARE PHYSICIAN: Dr. Margarita Rana.   CONSULTATION: Neurology consultation by Dr. Valora Corporal.   DISCHARGE DIAGNOSES:  1. Metabolic encephalopathy.  2. Chronic back pain status post recent back surgery and also shoulder surgery. 3. Urinary tract infection.  4. Hypokalemia.   DISCHARGE HOME MEDICATIONS: 1. Celexa 20 mg p.o. daily.  2. Citrical with vitamin D one tablet p.o. b.i.d.  3. Simcor 750 mg/20 mg one tablet p.o. at bedtime.  4. Hydrochlorothiazide 12.5 mg p.o. daily.  5. Vitamin B12 at 500 mcg p.o. daily.  6. Oxycodone 10 mg one-half tablet to one tablet every 3 hours as needed for pain.  7. Aspirin 81 mg p.o. daily. 8. Levaquin 250 mg p.o. daily for 4 days.   DISCHARGE DIET: Low-sodium diet.   DISCHARGE ACTIVITY: As tolerated.   FOLLOWUP INSTRUCTIONS:  1. PCP followup in 1 week. 2. Followup with Ortho for her back pain as per schedule.   DIAGNOSTIC DATA: Labs and imaging studies prior to discharge:  1. WBC 10, hemoglobin 12.9, hematocrit 39.1, platelet count 252.  Sodium 139, potassium 3.2, chloride 106, bicarbonate 27, BUN 11, creatinine 0.78, glucose 87, calcium of 8.8, TSH is 3.8, hba1c 6.1.  Urinalysis with 3+ leukocyte esterase, 83 WBCs and no bacteria.  2. Ultrasound Dopplers, carotid showing mild carotid atherosclerosis and left ICA tortuosity. No hemodynamically significant stenosis . 3. MRI of the brain was done with and without contrast showing no evidence of acute intracranial abnormality or mass, mild cerebral atrophy, and small bilateral mass or effusions noted.  4. CT of the head without contrast showing progression of atrophy. No acute intracranial abnormality noted.  5. ALT 23, AST 36, alkaline phosphatase 101, total bilirubin 0.7,  albumin of 3.2.  8. Troponins remain negative.   BRIEF HOSPITAL COURSE: Ms. Costabile is a 79 year old Caucasian female with past medical history significant for chronic back pain status post recent back surgery and also shoulder pain who presents to the hospital secondary to periods of confusion prior to admission.   1. Confusion, likely metabolic encephalopathy from pain medications and also UTI. The MRI of the brain was done which ruled out stroke. It was actually done with and without contrast. Carotid Dopplers were negative for any atherosclerotic stenosis. Over a period of several weeks,  her pain medications have been changed by her orthopedic physician for her back pain. However, Dilaudid p.o. was started about a month ago before her back surgery. She has been taking usually 2 tablets every day. The day prior to confusion, she actually took 3 tablets due to worsening pain. Not sure if that has to do anything with the confusion. If so, her medication could have been cleared from her system. She is alert, oriented at this time. Also found to have minor UTI for which she is being discharged on Levaquin. Neurology followup is pending. If they clear her, she will be discharged home.  2. Chronic back pain and recent back surgeries. Continue pain medications. Advise caution taking the pain medications and follow up with ortho physician. Continue taking calcium and vitamin D supplements.   Her course has been otherwise uneventful in the hospital.   DISCHARGE CONDITION: Stable.   DISCHARGE DISPOSITION: Home.   TIME SPENT ON DISCHARGE: 40 minutes.    ____________________________ Gladstone Lighter, MD  rk:es D: 02/09/2014 14:13:04 ET T: 02/09/2014 15:09:06 ET JOB#: [id number]  cc: Gladstone Lighter, MD, <Dictator> Gladstone Lighter MD ELECTRONICALLY SIGNED 02/12/2014 15:43

## 2014-12-24 NOTE — H&P (Signed)
PATIENT NAME:  [name], [name] MR#:  [id number] DATE OF BIRTH:  20-Oct-1935  DATE OF ADMISSION:  02/08/2014  REASON FOR ADMISSION: Confusion, TIA.   HISTORY OF PRESENT ILLNESS: This is a very nice 79 year old female with history of chronic back pain, hypertension, COPD, sleep apnea, and anxiety comes today with a complaint of being brought by the family with confusion. Her primary care physician is Dr. Lorie Phenix and she has been in good health other than the problems with her back. Her husband went for a short trip for a wedding, came back this morning, and they made up to have breakfast. Whenever they came back home, the patient was confused. At 10:20 a.m., the first episode happened lasting 10 minutes.  Then, the episode resolved and happened again within an hour and lasted about 10 minutes again. The patient did not remember anything about yesterday. She got confused by looking at some cards in front of the computer. They did not know what they were or who they were from. The patient did not remember the events that happened yesterday, which were some people coming in installing a new TV and a new router.  Whenever she saw the router and the TV, she asked what they were.  She could not recognize them as the new objects. The patient had this episode again lasting for 10 minutes, and then after she came back to her normal self, she did not remember being confused. The patient came to the Emergency Department, and at this moment, she is back to her baseline. She never had any slurred speech, any headaches, any chest pains. Did not have any palpitations. No droop of her mouth or any other problems.   REVIEW OF SYSTEMS:  A 12-system review of systems is done.  CONSTITUTIONAL: No fever, fatigue or weakness.  EYES: No blurry vision, double vision.  EARS, NOSE, THROAT: No difficulty swallowing. RESPIRATION: No shortness of breath. The patient has sleep apnea.  CARDIOVASCULAR: No chest pain or orthopnea.   GASTROINTESTINAL: No nausea, vomiting, abdominal pain.  GENITOURINARY: No dysuria, hematuria.  HEMATOLOGIC AND LYMPHATIC: No anemia, easy bruising or bleeding.  SKIN: No rashes or petechiae.  ENDOCRINOLOGY: No polyuria, polydipsia, polyphagia.  MUSCULOSKELETAL: Positive chronic back pain.  NEUROLOGIC: No numbness or tingling. Positive confusion.   PSYCHIATRIC: Positive confusion today and history of anxiety. The patient had extra pain yesterday for which she took an extra dose of Dilaudid. She usually takes 1 or 2 a day. Yesterday, was an extra painful day. She took 3.   PAST MEDICAL HISTORY:  1. Chronic back pain.  2. Hypertension.  3. Asthma/chronic obstructive pulmonary disease.  4. Sleep apnea.  5. Anxiety.  6. Compression fractures of the back.   ALLERGIES:  DICLOFENAC.    MEDICATIONS:  Citalopram 20 mg daily meloxicam 50 mg daily, hydrochlorothiazide 12.5 mg daily, Dilaudid 2 mg q. 4 hours as needed for pain. Oxygen as needed for shortness of breath, but not all the time, just occasionally, and she had a CPAP as well. She takes aspirin 81 mg daily.   SURGICAL HISTORY:  1. Kyphoplasty followed by a herniated disc repair.  2. Rotator cuff surgery on both arms.  3. Total reverse shoulder repair on the left side.  4. Upper kyphoplasty on the thoracic spine in the past.  5. Cholecystectomy.   SOCIAL HISTORY: Does not smoke, does not drink. Lives with her husband. She is retired.   FAMILY HISTORY: CVA, MI are negative from the family and  cancer is positive,  her dad; though, unknown where the cancer was located.   LABORATORY AND RADIOLOGICAL DATA:  Her CT scan of the head shows mostly a little bit of atrophy, but no acute strokes or other abnormalities. White count is 10.8. Her hemoglobin is 13.8, platelet count 262,000. Troponin 0.02, total protein 6.7, bilirubin 0.7. Glucose 82, creatinine 0.77, sodium 141, potassium 3.8. LFTs within normal limits.  EKG: Normal sinus rhythm. No  ST depression or elevation. LVH.   ASSESSMENT AND PLAN: This is a very nice 79 year old female with history of hypertension, chronic obstructive pulmonary disease, asthma, sleep apnea, anxiety, and chronic back pain who comes with 2 episodes of confusion that resolved.  1. Confusion, possible transient ischemic attack.  Two episodes lasting 10 minutes, then fully resolved.  The etiology as mentioned above, could be secondary to pain medications as she has been using a little bit more for the past 24 hours. Although no large amounts, they are a little bit higher for her than her usual dose. Consider these as a cause. For now, we are going to admit her for evaluation of possible transient ischemic attack versus stroke. MRI ordered, ultrasound of the carotid arteries, and an echocardiogram. Check cholesterol, hemoglobin A1c. Monitor her blood pressure. At this moment, allow permissive hypertension. Aspirin 325 mg a day. The patient was taking 81 mg daily, but she was not on any cholesterol-reducing medication. Continue to monitor neurological checks. 2. As far as her hypertension, I think it would be okay to just keep her on 12.5 mg of hydrochlorothiazide. If her blood pressure is significantly decreased, hold that medication to allow brain perfusion.  3. As far as her sleep apnea, continue with BiPAP.  4. As far as her anxiety, the patient seems to be stable. Continue citalopram.  5. Chronic pain. We will continue with low dose of Dilaudid, which is what she takes at home.  6. Gastrointestinal prophylaxis with Protonix.  7. Deep vein thrombosis prophylaxis with heparin.  TIME SPENT:  45 minutes.    ____________________________ Felipa Furnace, MD rsg:dd D: 02/08/2014 17:33:39 ET T: 02/08/2014 18:22:46 ET JOB#: [id number]  cc: Felipa Furnace, MD, <Dictator> Kallie Depolo Juanda Chance MD ELECTRONICALLY SIGNED 02/23/2014 11:11

## 2014-12-24 NOTE — H&P (Signed)
PATIENT NAME:  [name], [name] MR#:  [id number] DATE OF BIRTH:  Sep 15, 1935  DATE OF ADMISSION:  02/08/2014  PHYSICAL EXAMINATION:  VITAL SIGNS: Blood pressure 159/94, pulse 56, respirations 19, temperature 98.2, pulse oximetry 96% on room air.  GENERAL: Alert and oriented x 3, no acute distress. No respiratory distress. Hemodynamically stable.  HEENT: Pupils are equal and reactive. Extraocular movements are intact. Mucosa is moist. Anicteric sclerae. Pink conjunctivae. No oral lesions. No oropharyngeal exudates.  NECK: Supple. No JVD. No thyromegaly. No adenopathy. No carotid bruits.  CARDIOVASCULAR: Regular rate and rhythm. No murmurs, rubs, or gallops are appreciated. No displacement of PMI.  LUNGS: Clear without any wheezing or crepitus. No use of accessory muscles.  ABDOMEN: Soft, nontender, nondistended. No hepatosplenomegaly. There are no masses. Bowel sounds are positive.  GENITAL: Deferred.  EXTREMITIES: No edema, cyanosis or clubbing. Pulses +2. Capillary refill less than 3.  LYMPHATIC: Negative for lymphadenopathy in neck or supraclavicular areas.  SKIN: No rashes or petechiae.  MUSCULOSKELETAL: No significant joint effusions or joint swelling.  NEUROLOGIC: Cranial nerves II through XII intact. Strength is 5/5 in all 4 extremities. No facial droop. No deviation of the tongue. Uvula is central. Gait is normal. Romberg is negative. No pronator drift. Equal strength on both extremities.   EXTREMITIES: Sensation is normal all over. Mood is normal. No significant signs of depression.  GENERAL: The patient is alert, oriented x 3. Her speech is normal. No slurred speech.    ____________________________ [place] Sink, MD rsg:dd D: 02/08/2014 19:48:48 ET T: 02/08/2014 20:09:28 ET JOB#: [id number]  cc: [place] Sink, MD, <Dictator> Blanton Kardell America Brown MD ELECTRONICALLY SIGNED 02/23/2014 11:11

## 2014-12-24 NOTE — Consult Note (Signed)
Referring Physician:  James Ivanoff, Roselie Awkward :   Primary Care Physician:  James Ivanoff, [facility] : [facility], [address] [place], [place] [address], [place] [phone]  Reason for Consult: Admit Date: 08-Feb-2014  Chief Complaint: confusion  Reason for Consult: confusion   History of Present Illness: History of Present Illness:   79 yo RHD F presents secondary to confusion.  Pt apparently had two seperate episodes of confusion lasting 30 minutes each.  There is no reported weakness, numbness or seizure activity.  Pt does report taking a little more pain medication than normal.  Pt feels normal today and family is at bedside and endorse this.  ROS:  General denies complaints   HEENT no complaints   Lungs no complaints   Cardiac no complaints   GI no complaints   GU no complaints   Musculoskeletal no complaints   Extremities no complaints   Skin no complaints   Neuro no complaints   Endocrine no complaints   Psych no complaints   Past Medical/Surgical Hx:  left shoulder arthroscopy, repair of rotator cuff:   foot surgery bilateral:   Right shoulder rotator cuff repair:   bunionectomy X 2:   Kyphoplasty:   Tonsillectomy:   Gall Bladder removed 40 years ago:   Past Medical/ Surgical Hx:  Past Medical History reviewed by me as above   Past Surgical History reviewed by me as above   Home Medications: Medication Instructions Last Modified Date/Time  levofloxacin 250 mg oral tablet 1 tab(s) orally once a day x 4 days 10-Jun-15 12:48  citalopram 20 mg oral tablet 1 tab(s) orally once a day AM 09-Jun-15 22:47  Simcor 750 mg-20 mg oral tablet, extended release 1 tab(s) orally once a day (at bedtime)  09-Jun-15 22:47  Citracal + D 1 tab(s) orally 2 times a day  09-Jun-15 22:47  hydrochlorothiazide 12.5 mg oral capsule 1 cap(s) orally once a day AM 09-Jun-15 22:47  Vitamin B-12 500 mcg oral tablet 1 tab(s) orally once a day AM 09-Jun-15 22:47   oxycodone 10 mg oral tablet 1/2-1 tab(s) orally every 3 hours PRN 09-Jun-15 22:47  Aspirin Low Dose 1 tab(s) orally once a day (at bedtime)  09-Jun-15 22:47   Allergies:  Diclofenac: Rash  Social/Family History: Employment Status: retired  Lives With: children  Living Arrangements: house  Social History: no tob, no EtOH, no illicits  Family History: no stroke, no seizure   Vital Signs: **Vital Signs.:   10-Jun-15 11:55  Vital Signs Type Routine  Temperature Temperature (F) 98  Celsius 36.6  Temperature Source oral  Pulse Pulse 57  Respirations Respirations 18  Systolic BP Systolic BP 836  Diastolic BP (mmHg) Diastolic BP (mmHg) 76  Mean BP 95  Pulse Ox % Pulse Ox % 94  Pulse Ox Activity Level  At rest  Oxygen Delivery Room Air/ 21 %   Physical Exam: General: slighlty overweight, NAD  HEENT: normocephalic, sclera nonicteric, oropharynx clear  Neck: supple, no JVD, no bruits  Chest: CTA B, no wheezing, good movement  Cardiac: RRR, no murmurs, no edema, 2+ pulses  Extremities: no C/C/E, FROM   Neurologic Exam: Mental Status: alert and oriented x 3, normal speech and language, follows complex commands  Cranial Nerves: PERRLA, EOMI, nl VF, face symmetric, tongue midline, shoulder shrug equal  Motor Exam: 5/5 B normal, tone, no tremor  Deep Tendon Reflexes: 2+/4 B, plantars downgoing B, no Hoffman  Sensory Exam: pinprick, temperature, and vibration intact B  Coordination: FTN  and HTS WNL, nl RAM, nl gait   Lab Results: Thyroid:  10-Jun-15 05:00   Thyroid Stimulating Hormone 3.88 (0.45-4.50 (International Unit)  ----------------------- Pregnant patients have  different reference  ranges for TSH:  - - - - - - - - - -  Pregnant, first trimetser:  0.36 - 2.50 uIU/mL)  Hepatic:  09-Jun-15 12:47   Bilirubin, Total 0.7  Alkaline Phosphatase 101 (45-117 NOTE: New Reference Range 07/23/13)  SGPT (ALT) 23  SGOT (AST) 35  Total Protein, Serum 6.7  Albumin, Serum   3.2  Routine Chem:  10-Jun-15 05:00   Cholesterol, Serum 156  Triglycerides, Serum 121  HDL (INHOUSE) 48  VLDL Cholesterol Calculated 24  LDL Cholesterol Calculated 84 (Result(s) reported on 09 Feb 2014 at 06:07AM.)  Glucose, Serum 85  BUN 11  Creatinine (comp) 0.78  Sodium, Serum 139  Potassium, Serum  3.2  Chloride, Serum 106  CO2, Serum 27  Calcium (Total), Serum 8.8  Anion Gap  6  Osmolality (calc) 276  eGFR (African American) >60  eGFR (Non-African American) >60 (eGFR values <72m/min/1.73 m2 may be an indication of chronic kidney disease (CKD). Calculated eGFR is useful in patients with stable renal function. The eGFR calculation will not be reliable in acutely ill patients when serum creatinine is changing rapidly. It is not useful in  patients on dialysis. The eGFR calculation may not be applicable to patients at the low and high extremes of body sizes, pregnant women, and vegetarians.)  Hemoglobin A1c ([facility]) 6.1 (The American Diabetes Association recommends that a primary goal of therapy should be <7% and that physicians should reevaluate the treatment regimen in patients with HbA1c values consistently >8%.)  Cardiac:  09-Jun-15 12:47   Troponin I < 0.02 (0.00-0.05 0.05 ng/mL or less: NEGATIVE  Repeat testing in 3-6 hrs  if clinically indicated. >0.05 ng/mL: POTENTIAL  MYOCARDIAL INJURY. Repeat  testing in 3-6 hrs if  clinically indicated. NOTE: An increase or decrease  of 30% or more on serial  testing suggests a  clinically important change)  Routine UA:  09-Jun-15 18:51   Color (UA) Yellow  Clarity (UA) Hazy  Glucose (UA) Negative  Bilirubin (UA) Negative  Ketones (UA) 1+  Specific Gravity (UA) 1.023  Blood (UA) Negative  pH (UA) 6.0  Protein (UA) Negative  Nitrite (UA) Negative  Leukocyte Esterase (UA) 3+ (Result(s) reported on 08 Feb 2014 at 08:11PM.)  RBC (UA) 2 /HPF  WBC (UA) 83 /HPF  Bacteria (UA) NONE SEEN  Epithelial Cells (UA) 3 /HPF   Mucous (UA) PRESENT (Result(s) reported on 08 Feb 2014 at 08:11PM.)  Routine Hem:  10-Jun-15 05:00   WBC (CBC) 10.0  RBC (CBC) 4.26  Hemoglobin (CBC) 12.9  Hematocrit (CBC) 39.1  Platelet Count (CBC) 252  MCV 92  MCH 30.4  MCHC 33.1  RDW 14.0  Neutrophil % 68.4  Lymphocyte % 15.6  Monocyte % 10.6  Eosinophil % 4.5  Basophil % 0.9  Neutrophil #  6.9  Lymphocyte # 1.6  Monocyte #  1.1  Eosinophil # 0.5  Basophil # 0.1 (Result(s) reported on 09 Feb 2014 at 05:46AM.)   Radiology Results: [place]    09-Jun-15 18:01, [place]  [place] Bilateral   REASON FOR EXAM:    TIA/CVA  COMMENTS:       PROCEDURE: [place] - [place] DOPPLER BILATERAL  - Feb 08 2014  6:01PM     CLINICAL DATA:  TIA symptoms  EXAM:  BILATERAL CAROTID DUPLEX ULTRASOUND    TECHNIQUE:  Pearline Cables scale imaging, color Doppler and duplex ultrasound were  performed of bilateral carotid and vertebral arteries in the neck.    COMPARISON:  02/08/2014 MRI  FINDINGS:  Criteria: Quantification of carotid stenosis is based on velocity  parameters that correlate the residual internal carotid diameter  with NASCET-based stenosis levels, using the diameter of the distal  internal carotid lumen as the denominator for stenosis measurement.    The following velocity measurements were obtained:    RIGHT    ICA:  76/24 cm/sec    CCA:  323/55 cm/sec    SYSTOLIC ICA/CCA RATIO:  7.32  DIASTOLIC ICA/CCA RATIO:  2.02    ECA:  88 cm/sec    LEFT    ICA:  160/50 cm/sec    CCA:  54/27 cm/sec    SYSTOLIC ICA/CCA RATIO:  0.62    DIASTOLIC ICA/CCA RATIO:  3.76    ECA:  107 cm/sec  RIGHT CAROTID ARTERY: Minor echogenic shadowing plaque formation. No  hemodynamically significant right ICA stenosis, velocity elevation,  or turbulent flow. Degree of narrowing less than 50%.    RIGHT VERTEBRAL ARTERY:  Antegrade    LEFT CAROTID ARTERY: Similar scattered minor echogenic plaque  formation. No  hemodynamically significant left ICA stenosis,  velocity elevation, or turbulent flow. Mild left ICA tortuosity  appears to account for slight velocity elevation. Degree of  narrowing also less than 50%.    LEFTVERTEBRAL ARTERY:  Antegrade     IMPRESSION:  Minor carotid atherosclerosis. Slight left ICA tortuosity. No  hemodynamically significant ICA stenosis.      Electronically Signed    By: Daryll Brod M.D.    On: 02/08/2014 19:01         Verified By: Earl Gala, M.D.,  CT:    09-Jun-15 12:54, CT Head Without Contrast  CT Head Without Contrast   REASON FOR EXAM:    confusion/loss of memory sudden  COMMENTS:       PROCEDURE: CT  - CT HEAD WITHOUT CONTRAST  - Feb 08 2014 12:54PM     CLINICAL DATA:  Sudden confusion and memory loss.    EXAM:  CT HEAD WITHOUT CONTRAST    TECHNIQUE:  Contiguous axial images were obtained from the base of the skull  through the vertex without intravenous contrast.    COMPARISON:  11/27/2006  FINDINGS:  No mass lesion. No midline shift. No acute hemorrhage or hematoma.  No extra-axial fluid collections. No evidence of acute infarction.  The patient has developed slight atrophy since the prior study and  ventricles are minimally more prominent than on the prior study. No  osseous abnormality.     IMPRESSION:  No acute intracranial abnormality.  Slight progression of atrophy.      Electronically Signed    By: Rozetta Nunnery M.D.    On: 02/08/2014 12:58     Verified By: Larey Seat, M.D.,   Radiology Impression: Radiology Impression: MRI of brain personally reviewed by me and is completely normal   Impression/Recommendations: Recommendations:   labs reviewed by me and  show UTI notes reviewed by me   Encephalopathy- resolved, this is likely from UTI and overdose of pain medications UTI-  untreated treat UTI be weary of pain medications will sign off, please call with questions  Electronic Signatures: Jamison Neighbor (MD)  (Signed 10-Jun-15 14:40)  Authored: REFERRING PHYSICIAN, Primary Care Physician, Consult, History of Present Illness,  Review of Systems, PAST MEDICAL/SURGICAL HISTORY, HOME MEDICATIONS, ALLERGIES, Social/Family History, NURSING VITAL SIGNS, Physical Exam-, LAB RESULTS, RADIOLOGY RESULTS, Recommendations   Last Updated: 10-Jun-15 14:40 by Jamison Neighbor (MD)

## 2015-01-17 DIAGNOSIS — J8410 Pulmonary fibrosis, unspecified: Secondary | ICD-10-CM | POA: Diagnosis not present

## 2015-01-17 DIAGNOSIS — R0902 Hypoxemia: Secondary | ICD-10-CM | POA: Diagnosis not present

## 2015-01-17 DIAGNOSIS — G4733 Obstructive sleep apnea (adult) (pediatric): Secondary | ICD-10-CM | POA: Diagnosis not present

## 2015-01-17 DIAGNOSIS — R0609 Other forms of dyspnea: Secondary | ICD-10-CM | POA: Diagnosis not present

## 2015-02-02 ENCOUNTER — Encounter: Payer: Self-pay | Admitting: *Deleted

## 2015-02-02 ENCOUNTER — Ambulatory Visit (INDEPENDENT_AMBULATORY_CARE_PROVIDER_SITE_OTHER): Payer: Medicare Other | Admitting: Urology

## 2015-02-02 VITALS — BP 130/76 | HR 48 | Ht 60.0 in | Wt 147.6 lb

## 2015-02-02 DIAGNOSIS — M1990 Unspecified osteoarthritis, unspecified site: Secondary | ICD-10-CM | POA: Insufficient documentation

## 2015-02-02 DIAGNOSIS — I10 Essential (primary) hypertension: Secondary | ICD-10-CM | POA: Insufficient documentation

## 2015-02-02 DIAGNOSIS — N816 Rectocele: Secondary | ICD-10-CM | POA: Diagnosis not present

## 2015-02-02 DIAGNOSIS — G4730 Sleep apnea, unspecified: Secondary | ICD-10-CM | POA: Insufficient documentation

## 2015-02-02 DIAGNOSIS — N8110 Cystocele, unspecified: Secondary | ICD-10-CM

## 2015-02-02 DIAGNOSIS — F419 Anxiety disorder, unspecified: Secondary | ICD-10-CM | POA: Diagnosis not present

## 2015-02-02 LAB — URINALYSIS, COMPLETE
Bilirubin, UA: NEGATIVE
Glucose, UA: NEGATIVE
Ketones, UA: NEGATIVE
Nitrite, UA: NEGATIVE
Protein, UA: NEGATIVE
RBC, UA: NEGATIVE
Specific Gravity, UA: 1.02 (ref 1.005–1.030)
Urobilinogen, Ur: 0.2 mg/dL (ref 0.2–1.0)
pH, UA: 7.5 (ref 5.0–7.5)

## 2015-02-02 LAB — MICROSCOPIC EXAMINATION: Bacteria, UA: NONE SEEN

## 2015-02-02 NOTE — Progress Notes (Signed)
02/02/2015 12:00 PM   [name] [birth year] [id number]  Referring provider: Margarita Rana, MD [address] [place], [place] [address]  Chief Complaint  Patient presents with  . Cystocele    bladder prolapse x 2 years ago    HPI: Patient has a bulge in her vaginal area that is intermittant and painless  Occasional damp pad during the day.  Nocturia x1  No bleeding.   History of 2 uncomplicated vaginal deliveries and menapause at age 79     PMH: Past Medical History  Diagnosis Date  . History of back surgery   . Altered mental state   . OA (osteoarthritis)   . Osteopenia   . Constipation   . Thrush   . Incontinence of urine   . History of herniated intervertebral disc   . Obesity   . Hypocalcemia   . Cellulitis   . Fibrosis, idiopathic pulmonary   . Compression fracture of L4 lumbar vertebra   . Vitamin D deficiency   . Shortness of breath   . Pneumonia   . BMI 30.0-30.9,adult   . Sleep apnea   . Fatigue   . Hypercholesteremia   . Diverticulosis   . Cystocele   . Cystitis   . Gastroenteritis   . HTN (hypertension)   . Chest pain, atypical     Surgical History: No past surgical history on file.  Home Medications:    Medication List       This list is accurate as of: 02/02/15 12:00 PM.  Always use your most recent med list.               alendronate 10 MG tablet  Commonly known as:  FOSAMAX  Take 10 mg by mouth daily before breakfast. Take with a full glass of water on an empty stomach.     aspirin 81 MG chewable tablet  Chew by mouth.     calcium-vitamin D 500-400 MG-UNIT per tablet  Commonly known as:  OSCAL-500  Take by mouth.     citalopram 20 MG tablet  Commonly known as:  CELEXA  Take 20 mg by mouth daily.     Cyanocobalamin 1000 MCG Tbcr  Take by mouth.     donepezil 5 MG tablet  Commonly known as:  ARICEPT  Take 5 mg by mouth at bedtime.     HYDROmorphone 2 MG tablet  Commonly known as:  DILAUDID  Take 2 mg  by mouth every 4 (four) hours as needed for severe pain.     Omega-3 Fish Oil 1200 MG Caps  Take by mouth.     Phosphatidylserine-DHA-EPA 100-19.5-6.5 MG Caps  Take 1 capsule by mouth daily.     Potassium Gluconate 595 MG Tbcr  Take by mouth.        Allergies: No Known Allergies  Family History: Family History  Problem Relation Age of Onset  . Family history unknown: Yes    Social History:  reports that she has never smoked. She has never used smokeless tobacco. She reports that she does not drink alcohol. Her drug history is not on file.  ROS: Urological Symptom Review  Patient is experiencing the following symptoms: Leakage of urine   Review of Systems  Gastrointestinal (upper)  : Negative for upper GI symptoms  Gastrointestinal (lower) : Diarrhea constipation  Constitutional : Fatigue  Skin: Negative for skin symptoms N/A  Eyes: Negative for eye symptoms  Ear/Nose/Throat : Negative for Ear/Nose/Throat symptoms  Hematologic/Lymphatic: Easy  bruising  Cardiovascular : Negative for cardiovascular symptoms  Respiratory : Shortness of breath  Endocrine: Negative for endocrine symptoms  Musculoskeletal: Back pain  Neurological: Negative for neurological symptoms  Psychologic: Anxiety   Physical Exam: BP 130/76 mmHg  Pulse 48  Ht 5' (1.524 m)  Wt 147 lb 9.6 oz (66.951 kg)  BMI 28.83 kg/m2  Constitutional:  Alert and oriented, No acute distress. HEENT: [place] AT, moist mucus membranes.  Trachea midline, no masses. Cardiovascular: No clubbing, cyanosis, or edema. Respiratory: Normal respiratory effort, no increased work of breathing. GI: Abdomen is soft, nontender, nondistended, no abdominal masses GU: No CVA tenderness. Rectocele, no cystocele Skin: No rashes, bruises or suspicious lesions. Lymph: No cervical or inguinal adenopathy. Neurologic: Grossly intact, no focal deficits, moving all 4 extremities. Psychiatric: Normal mood and  affect.  Laboratory Data: Lab Results  Component Value Date   WBC 10.0 02/09/2014   HGB 12.9 02/09/2014   HCT 39.1 02/09/2014   MCV 92 02/09/2014   PLT 252 02/09/2014    Lab Results  Component Value Date   CREATININE 0.78 02/09/2014    No results found for: PSA  No results found for: TESTOSTERONE  No results found for: HGBA1C  Urinalysis No results found for: COLORURINE, APPEARANCEUR, LABSPEC, PHURINE, GLUCOSEU, HGBUR, BILIRUBINUR, KETONESUR, PROTEINUR, UROBILINOGEN, NITRITE, LEUKOCYTESUR  Pertinent Imaging: none  Assessment & Plan:  Refer for ob-gyn       Problem List Items Addressed This Visit    Anxiety - Primary   Arthritis   Hypertension   Sleep apnea    Other Visit Diagnoses    Female bladder prolapse        Relevant Orders    Bladder Scan (Post Void Residual) in office    Urinalysis, Complete       No Follow-up on file.  [facility] [address], [place], [place] [address] [phone]

## 2015-02-08 DIAGNOSIS — M5126 Other intervertebral disc displacement, lumbar region: Secondary | ICD-10-CM | POA: Diagnosis not present

## 2015-02-08 DIAGNOSIS — M7061 Trochanteric bursitis, right hip: Secondary | ICD-10-CM | POA: Diagnosis not present

## 2015-02-08 DIAGNOSIS — M47896 Other spondylosis, lumbar region: Secondary | ICD-10-CM | POA: Diagnosis not present

## 2015-02-08 DIAGNOSIS — K5909 Other constipation: Secondary | ICD-10-CM | POA: Diagnosis not present

## 2015-02-08 DIAGNOSIS — M545 Low back pain: Secondary | ICD-10-CM | POA: Diagnosis not present

## 2015-02-08 DIAGNOSIS — M5116 Intervertebral disc disorders with radiculopathy, lumbar region: Secondary | ICD-10-CM | POA: Diagnosis not present

## 2015-02-08 DIAGNOSIS — M4806 Spinal stenosis, lumbar region: Secondary | ICD-10-CM | POA: Diagnosis not present

## 2015-02-08 DIAGNOSIS — M5137 Other intervertebral disc degeneration, lumbosacral region: Secondary | ICD-10-CM | POA: Diagnosis not present

## 2015-02-08 DIAGNOSIS — M791 Myalgia: Secondary | ICD-10-CM | POA: Diagnosis not present

## 2015-02-08 DIAGNOSIS — M461 Sacroiliitis, not elsewhere classified: Secondary | ICD-10-CM | POA: Diagnosis not present

## 2015-02-28 DIAGNOSIS — H2513 Age-related nuclear cataract, bilateral: Secondary | ICD-10-CM | POA: Diagnosis not present

## 2015-03-15 ENCOUNTER — Encounter: Payer: Self-pay | Admitting: Obstetrics and Gynecology

## 2015-03-15 ENCOUNTER — Ambulatory Visit (INDEPENDENT_AMBULATORY_CARE_PROVIDER_SITE_OTHER): Payer: Medicare Other | Admitting: Obstetrics and Gynecology

## 2015-03-15 VITALS — BP 125/62 | HR 53 | Ht 60.0 in | Wt 146.9 lb

## 2015-03-15 DIAGNOSIS — N9489 Other specified conditions associated with female genital organs and menstrual cycle: Secondary | ICD-10-CM

## 2015-03-15 DIAGNOSIS — R1030 Lower abdominal pain, unspecified: Secondary | ICD-10-CM

## 2015-03-15 DIAGNOSIS — R102 Pelvic and perineal pain: Secondary | ICD-10-CM

## 2015-03-15 DIAGNOSIS — N819 Female genital prolapse, unspecified: Secondary | ICD-10-CM

## 2015-03-15 NOTE — Progress Notes (Signed)
Subjective:    [name] is a 79 y.o. G2P2 postmenopausal female who presents for evaluation of a cystocele and rectocele. Problem started 2 years ago. Symptoms include: prolapse of tissue with straining, discomfort: moderate, impaired defecation: moderate and urinary hesitancy. Also c/o moderate abdominal crampy pain and bloating prior to BM, with sudden onset.  Has to massage abdomen to get stools to pass.  Denies hard stools but does report that stools are still difficult to pass; has stools at least daily.  Symptoms have gradually worsened.  Denies urinary incontinence.   Menstrual History: OB History    Gravida Para Term Preterm AB TAB SAB Ectopic Multiple Living   2 2          SVD x 2   No LMP recorded. Patient is postmenopausal since age 24. Has had colonoscopy within past 5 years.     Past Medical History  Diagnosis Date  . History of back surgery   . Altered mental state   . OA (osteoarthritis)   . Osteopenia   . Constipation   . Thrush   . Incontinence of urine   . History of herniated intervertebral disc   . Obesity   . Hypocalcemia   . Cellulitis   . Fibrosis, idiopathic pulmonary   . Compression fracture of L4 lumbar vertebra   . Vitamin D deficiency   . Shortness of breath   . Pneumonia   . BMI 30.0-30.9,adult   . Sleep apnea   . Fatigue   . Hypercholesteremia   . Diverticulosis   . Cystocele   . Cystitis   . Gastroenteritis   . HTN (hypertension)   . Chest pain, atypical    Past Surgical History  Procedure Laterality Date  . Rotator cuff repair Bilateral     left-12/2009; right-03/2009 dr.califf    History  Substance Use Topics  . Smoking status: Never Smoker   . Smokeless tobacco: Never Used  . Alcohol Use: No    Current Outpatient Prescriptions on File Prior to Visit  Medication Sig Dispense Refill  . alendronate (FOSAMAX) 10 MG tablet Take 10 mg by mouth daily before breakfast. Take with a full glass of water on an empty stomach.     Marland Kitchen aspirin 81 MG chewable tablet Chew by mouth.    . calcium-vitamin D (OSCAL-500) 500-400 MG-UNIT per tablet Take by mouth.    . citalopram (CELEXA) 20 MG tablet Take 20 mg by mouth daily.    . Cyanocobalamin 1000 MCG TBCR Take by mouth.    . donepezil (ARICEPT) 5 MG tablet Take 5 mg by mouth at bedtime.    Marland Kitchen HYDROmorphone (DILAUDID) 2 MG tablet Take 2 mg by mouth every 4 (four) hours as needed for severe pain.    . Omega-3 Fatty Acids (OMEGA-3 FISH OIL) 1200 MG CAPS Take by mouth.    . Phosphatidylserine-DHA-EPA 100-19.5-6.5 MG CAPS Take 1 capsule by mouth daily.    . Potassium Gluconate 595 MG TBCR Take by mouth.     No Known Allergies   Review of Systems Pertinent items are noted in HPI. All other review of systems negative.   Objective:     BP 125/62 mmHg  Pulse 53  Ht 5' (1.524 m)  Wt 146 lb 14.4 oz (66.633 kg)  BMI 28.69 kg/m2 Pelvis:  External genitalia: normal general appearance Urinary system: urethral meatus normal and bladder nontender.  Vaginal: atrophic mucosa, cystocele present, moderate and rectocele present, mild Cervix: normal appearance Adnexa: non palpable  Uterus: normal size and shape; grade 2 prolapse present Rectal: good sphincter tone and no masses     Assessment:    The patient has a cystocele, rectocele and uterine prolapse   Plan:    Discussed cystoceles/rectoceles and uterine prolapse management options with the patient including surgical and non-surgical.  Patient opts for non-surgical management. All questions answered. Neurosurgeon distributed. Discussed pessary and will plan visit for fitting. Follow up in 2 weeks or as needed.   Patient may also need f/u with a GI specialist as she notes that even minute foods like mints can cause her stomach to spasm and exacerbate abdominal cramping followed by painful BM.    Rubie Maid, MD [facility]

## 2015-03-30 ENCOUNTER — Encounter: Payer: Self-pay | Admitting: Obstetrics and Gynecology

## 2015-03-30 ENCOUNTER — Ambulatory Visit (INDEPENDENT_AMBULATORY_CARE_PROVIDER_SITE_OTHER): Payer: Medicare Other | Admitting: Obstetrics and Gynecology

## 2015-03-30 VITALS — BP 129/68 | HR 53 | Ht 60.0 in | Wt 147.3 lb

## 2015-03-30 DIAGNOSIS — Z4689 Encounter for fitting and adjustment of other specified devices: Secondary | ICD-10-CM | POA: Diagnosis not present

## 2015-03-30 DIAGNOSIS — N814 Uterovaginal prolapse, unspecified: Secondary | ICD-10-CM | POA: Diagnosis not present

## 2015-03-30 NOTE — Progress Notes (Deleted)
  GYNECOLOGY PROGRESS NOTE  Subjective:    Patient ID: [name], female    DOB: 23-Nov-1935, 79 y.o.   MRN: [id number]  HPI  Patient is a 79 y.o. G2P2 female who presents for   {Common ambulatory SmartLinks:19316}  Review of Systems {ros; complete:30496}   Objective:   Blood pressure 129/68, pulse 53, height 5' (1.524 m), weight 147 lb 4.8 oz (66.815 kg). General appearance: {general exam:16600} Abdomen: {abdominal exam:16834} Pelvic: {pelvic exam:16852::"cervix normal in appearance","external genitalia normal","no adnexal masses or tenderness","no cervical motion tenderness","rectovaginal septum normal","uterus normal size, shape, and consistency","vagina normal without discharge"} Extremities: {extremity exam:5109} Neurologic: {neuro exam:17854}   Assessment:    Plan:   Size 3 ring with support pessary.

## 2015-04-02 NOTE — Progress Notes (Signed)
Subjective:    [name] is a 79 y.o. G80P2 postmenopausal female who presents for pessary fitting. Reports that since last visit, her symptoms have slightly improved.  No longer having issues with constipation, however does still note the sharp shooting stabbing pains ~ 5-10 min prior to needing to defecate. Denies bloody stools.   Review of Systems Pertinent items are noted in HPI. All other review of systems negative.   Objective:     BP 129/68 mmHg  Pulse 53  Ht 5' (1.524 m)  Wt 147 lb 4.8 oz (66.815 kg)  BMI 28.77 kg/m2 Pelvis:  External genitalia: normal general appearance Urinary system: urethral meatus normal and bladder nontender.  Vaginal: atrophic mucosa, cystocele present, moderate and rectocele present, mild Cervix: normal appearance Adnexa: non palpable Uterus: normal size and shape; grade 2 prolapse present Rectal: good sphincter tone and no masses     Assessment:    The patient has a cystocele, rectocele and uterine prolapse   Plan:   Pessary fitting performed today.  Will order Size 3 ring with support pessary.  Follow up in 2 weeks for pessary insertion.  Patient may also need f/u with a GI specialist as she notes that even minute foods like mints can cause her stomach to spasm and exacerbate abdominal cramping followed by painful BM. Will refer if pessary does not help to improve symptoms.    Rubie Maid, MD [facility]

## 2015-04-13 ENCOUNTER — Ambulatory Visit (INDEPENDENT_AMBULATORY_CARE_PROVIDER_SITE_OTHER): Payer: Medicare Other | Admitting: Obstetrics and Gynecology

## 2015-04-13 ENCOUNTER — Encounter: Payer: Self-pay | Admitting: Obstetrics and Gynecology

## 2015-04-13 VITALS — BP 144/65 | HR 51 | Ht 60.0 in | Wt 147.7 lb

## 2015-04-13 DIAGNOSIS — Z4689 Encounter for fitting and adjustment of other specified devices: Secondary | ICD-10-CM | POA: Diagnosis not present

## 2015-04-13 MED ORDER — OXYQUINOLONE SULFATE 0.025 % VA GEL: 1.0000 | Freq: Two times a day (BID) | VAGINAL | Status: DC

## 2015-04-13 NOTE — Patient Instructions (Signed)
To use Trim-San gel vaginally twice weekly.

## 2015-04-13 NOTE — Progress Notes (Signed)
GYNECOLOGY CLINIC PROGRESS NOTE  S: The patient presented today for a pessary insertion. She reports no vaginal bleeding or discharge. She denies pelvic discomfort and difficulty urinating or moving her bowels. ? O: Blood pressure 144/65, pulse 51, height 5' (1.524 m), weight 147 lb 11.2 oz (66.996 kg). External genitalia: normal general appearance Urinary system: urethral meatus normal and bladder nontender.  Vaginal: atrophic mucosa, cystocele present, moderate and rectocele present, mild Cervix: normal appearance Adnexa: non palpable Uterus: normal size and shape; grade 2 prolapse present Rectal: good sphincter tone and no masses   A: Cystocele (moderate) with uterine prolapse, and small rectocele.  P:  Size 3 (2 1/2) ring with support placed today. The patient should return in 2 weeks for pessary check.  Prescribed Trimo-San gel to use twice weekly.    Rubie Maid, MD [facility]

## 2015-04-13 NOTE — Progress Notes (Signed)
Patient ID: [name], female   DOB: [birth year], 79 y.o.   MRN: [id number] Pt presents for pessary insertion  Ring with support #3 2 1/2

## 2015-04-17 DIAGNOSIS — G4733 Obstructive sleep apnea (adult) (pediatric): Secondary | ICD-10-CM | POA: Diagnosis not present

## 2015-04-17 DIAGNOSIS — Z713 Dietary counseling and surveillance: Secondary | ICD-10-CM | POA: Diagnosis not present

## 2015-04-19 ENCOUNTER — Ambulatory Visit (INDEPENDENT_AMBULATORY_CARE_PROVIDER_SITE_OTHER): Payer: Medicare Other | Admitting: Obstetrics and Gynecology

## 2015-04-19 ENCOUNTER — Encounter: Payer: Self-pay | Admitting: Obstetrics and Gynecology

## 2015-04-19 VITALS — BP 118/72 | HR 60 | Ht 60.0 in | Wt 151.0 lb

## 2015-04-19 DIAGNOSIS — N898 Other specified noninflammatory disorders of vagina: Secondary | ICD-10-CM | POA: Diagnosis not present

## 2015-04-19 DIAGNOSIS — R3981 Functional urinary incontinence: Secondary | ICD-10-CM | POA: Diagnosis not present

## 2015-04-19 DIAGNOSIS — Z960 Presence of urogenital implants: Secondary | ICD-10-CM | POA: Insufficient documentation

## 2015-04-19 DIAGNOSIS — Z9889 Other specified postprocedural states: Secondary | ICD-10-CM | POA: Diagnosis not present

## 2015-04-19 NOTE — Progress Notes (Signed)
GYNECOLOGY PROGRESS NOTE  Subjective:    Patient ID: [name], female    DOB: 18-Aug-1936, 79 y.o.   MRN: [id number]  HPI  Patient is a 79 y.o. G2P2 female who presents for complaints of leakage x 1 week.  Unsure if she is having urinary leakage, or vaginal discharge.  Notes that she is having to wear a pantyliner, and notes a slight yellow-tinged color.  Feels an occasional small trickle from vaginal area (unsure if from urethra or vagina), which happens intermittently, without warning. No exacerbating or alleviating factors. Has had pessary in place x 1 week. Thinks that this may be related to pessary use.  Has not initiated use of Trimo-San gel.   The following portions of the patient's history were reviewed and updated as appropriate: allergies, current medications, past family history, past medical history, past social history, past surgical history and problem list.  Review of Systems A comprehensive review of systems was negative except for: Genitourinary: positive for vaginal discharge and hesitancy   Objective:   Blood pressure 118/72, pulse 60, height 5' (1.524 m), weight 151 lb (68.493 kg). General appearance: alert and no distress Abdomen: soft, non-tender; bowel sounds normal; no masses,  no organomegaly Pelvic: external genitalia normal.  Pessary in place.  No leakage of urine on cough or Valsalva.  Pessary removed to reveal a small amount of yellow-tinged discharge (no odor) at vaginal introitus and in vagina. Cervix appears wnl. Bimanual exam not done.  Extremities: extremities normal, atraumatic, no cyanosis or edema Neurologic: Grossly normal   Assessment:   Vaginal discharge vs urinary incontinence  Plan:   No evidence of urinary leakage.  Patient had not voided prior to exam.  Likely secondary to vaginal discharge due to foreign body (pessary).  Advised on initiation of Trimo-San gel to cut down on bacteria and discharge.  If still leaking after use of gel, can  consider ordering same size pessary except with knob as explained to patient that sometimes correcting bladder and uterine prolapse can reveal urinary incontinence due to change in bladder positioning and urethral hypermobility.  RTC in 1 week.    Rubie Maid, MD [facility]

## 2015-04-24 ENCOUNTER — Encounter: Payer: Self-pay | Admitting: Family Medicine

## 2015-04-24 ENCOUNTER — Ambulatory Visit (INDEPENDENT_AMBULATORY_CARE_PROVIDER_SITE_OTHER): Payer: Medicare Other | Admitting: Family Medicine

## 2015-04-24 VITALS — BP 130/70 | HR 60 | Temp 98.3°F | Resp 16 | Ht 60.0 in | Wt 149.4 lb

## 2015-04-24 DIAGNOSIS — S32040S Wedge compression fracture of fourth lumbar vertebra, sequela: Secondary | ICD-10-CM | POA: Insufficient documentation

## 2015-04-24 DIAGNOSIS — J84112 Idiopathic pulmonary fibrosis: Secondary | ICD-10-CM | POA: Insufficient documentation

## 2015-04-24 DIAGNOSIS — S32040A Wedge compression fracture of fourth lumbar vertebra, initial encounter for closed fracture: Secondary | ICD-10-CM | POA: Insufficient documentation

## 2015-04-24 DIAGNOSIS — F419 Anxiety disorder, unspecified: Secondary | ICD-10-CM | POA: Diagnosis not present

## 2015-04-24 DIAGNOSIS — Z0000 Encounter for general adult medical examination without abnormal findings: Secondary | ICD-10-CM

## 2015-04-24 DIAGNOSIS — E7800 Pure hypercholesterolemia, unspecified: Secondary | ICD-10-CM | POA: Insufficient documentation

## 2015-04-24 DIAGNOSIS — Z1231 Encounter for screening mammogram for malignant neoplasm of breast: Secondary | ICD-10-CM

## 2015-04-24 DIAGNOSIS — I10 Essential (primary) hypertension: Secondary | ICD-10-CM | POA: Diagnosis not present

## 2015-04-24 DIAGNOSIS — R21 Rash and other nonspecific skin eruption: Secondary | ICD-10-CM | POA: Diagnosis not present

## 2015-04-24 DIAGNOSIS — G3184 Mild cognitive impairment, so stated: Secondary | ICD-10-CM | POA: Diagnosis not present

## 2015-04-24 MED ORDER — NYSTATIN 100000 UNIT/GM EX OINT: 1.0000 "application " | TOPICAL_OINTMENT | Freq: Two times a day (BID) | CUTANEOUS | Status: DC

## 2015-04-24 MED ORDER — CITALOPRAM HYDROBROMIDE 20 MG PO TABS: 20.0000 mg | ORAL_TABLET | Freq: Every day | ORAL | Status: DC

## 2015-04-24 NOTE — Progress Notes (Signed)
Patient ID: [name], female   DOB: 02-01-1936, 78 y.o.   MRN: [id number]        Patient: [name], Female    DOB: March 12, 1936, 79 y.o.   MRN: [id number] Visit Date: 04/24/2015  Today's Provider: Margarita Rana, MD   Chief Complaint  Patient presents with  . Medicare Wellness   Subjective:    Annual wellness visit KATI RIGGENBACH is a 79 y.o. female. She feels well. She reports exercising 2-3 days a week. She reports she is sleeping well 9 hours of sleep.  04/18/14 CPE 01/26/14 Mammogram-BI-RADS 1 04/20/14 BMD-Osteopenia 04/18/14 EKG 08/23/08 Colonoscopy-Diverticulosis  Patient is also here for review of her hypertension and other chronic medical problems. Blood pressure has been stable. Takes her medication without any difficulty. Needs to have labs done.  Currently not taking ay bp medication and off her CPAP since she lost weight. She does follow up with Dr. Raul Del for her lungs. Mood is stable. Has currently stopped Aricept as was causing an upset stomach.  Memory  stable.      Review of Systems  Constitutional: Negative.   HENT: Negative.   Eyes: Negative.   Respiratory: Positive for shortness of breath.   Cardiovascular: Negative.   Gastrointestinal: Negative.   Endocrine: Negative.   Genitourinary: Positive for frequency and flank pain.  Musculoskeletal: Positive for back pain.  Skin: Negative.   Allergic/Immunologic: Negative.   Neurological: Negative.   Hematological: Negative.   Psychiatric/Behavioral: Negative.     Social History   Social History  . Marital Status: Married    Spouse Name: [name]  . Number of Children: 2  . Years of Education: N/A   Occupational History  . Not on file.   Social History Main Topics  . Smoking status: Never Smoker   . Smokeless tobacco: Never Used  . Alcohol Use: No  . Drug Use: No  . Sexual Activity: No   Other Topics Concern  . Not on file   Social History Narrative    Patient Active  Problem List   Diagnosis Date Noted  . Vaginal pessary in situ 04/19/2015  . Cystocele with uterine descensus 03/30/2015  . Anxiety 02/02/2015  . Arthritis 02/02/2015  . Hypertension 02/02/2015  . Sleep apnea 02/02/2015    Past Surgical History  Procedure Laterality Date  . Rotator cuff repair Bilateral     left-12/2009; right-03/2009 dr.califf    Her family history includes COPD in her mother; Cancer in her brother.    Previous Medications   ALENDRONATE (FOSAMAX) 10 MG TABLET    Take 10 mg by mouth once a week. Take with a full glass of water on an empty stomach.   ASPIRIN 81 MG CHEWABLE TABLET    Chew by mouth.   CALCIUM-VITAMIN D (OSCAL-500) 500-400 MG-UNIT PER TABLET    Take 1 tablet by mouth daily.    CITALOPRAM (CELEXA) 20 MG TABLET    Take 20 mg by mouth daily.   DONEPEZIL (ARICEPT) 5 MG TABLET    Take 5 mg by mouth at bedtime.   HYDROMORPHONE (DILAUDID) 2 MG TABLET    Take 2 mg by mouth every 4 (four) hours as needed for severe pain.   NAPROXEN (NAPROSYN) 500 MG TABLET    Take 500 mg by mouth daily.    OXYQUINOLONE SULFATE VAGINAL (TRIMO-SAN) 0.025 % GEL    Place 1 Applicatorful vaginally 2 (two) times daily.    Patient Care Team: Margarita Rana, MD as PCP -  General (Family Medicine)     Objective:   Vitals: BP 130/70 mmHg  Pulse 60  Temp(Src) 98.3 F (36.8 C) (Oral)  Resp 16  Ht 5' (1.524 m)  Wt 149 lb 6.4 oz (67.767 kg)  BMI 29.18 kg/m2  SpO2 96%  Physical Exam  Constitutional: She is oriented to person, place, and time. She appears well-developed and well-nourished.  HENT:  Head: Normocephalic and atraumatic.  Right Ear: Tympanic membrane, external ear and ear canal normal.  Left Ear: Tympanic membrane, external ear and ear canal normal.  Nose: Nose normal.  Mouth/Throat: Uvula is midline, oropharynx is clear and moist and mucous membranes are normal.  Eyes: Conjunctivae, EOM and lids are normal. Pupils are equal, round, and reactive to light.  Neck:  Trachea normal and normal range of motion. Neck supple. Carotid bruit is not present. No thyroid mass and no thyromegaly present.  Cardiovascular: Normal rate, regular rhythm and normal heart sounds.   Pulmonary/Chest: Effort normal. She has wheezes (inspiratory).  Abdominal: Soft. Normal appearance and bowel sounds are normal. There is no hepatosplenomegaly. There is no tenderness.  Genitourinary: No breast swelling, tenderness or discharge.  Musculoskeletal: Normal range of motion.  Lymphadenopathy:    She has no cervical adenopathy.    She has no axillary adenopathy.  Neurological: She is alert and oriented to person, place, and time. She has normal strength. No cranial nerve deficit.  Skin: Skin is warm, dry and intact. Rash (under bilateral breast) noted. There is erythema.  Psychiatric: She has a normal mood and affect. Her speech is normal and behavior is normal. Judgment and thought content normal. Cognition and memory are normal.    Activities of Daily Living In your present state of health, do you have any difficulty performing the following activities: 04/24/2015  Hearing? N  Vision? N  Difficulty concentrating or making decisions? Y  Walking or climbing stairs? Y  Dressing or bathing? N  Doing errands, shopping? N    Fall Risk Assessment Fall Risk  04/24/2015  Falls in the past year? No     Depression Screen PHQ 2/9 Scores 04/24/2015  PHQ - 2 Score 0    Cognitive Testing - 6-CIT  Correct? Score   What year is it? yes 0 0 or 4  What month is it? yes 0 0 or 3  Memorize:    Pia Mau,  44,  [address]  [place],      What time is it? (within 1 hour) yes 0 0 or 3  Count backwards from 20 yes 0 0, 2, or 4  Name the months of the year yes 0 0, 2, or 4  Repeat name & address above yes 2 0, 2, 4, 6, 8, or 10       TOTAL SCORE  2/28   Interpretation:  Normal  Normal (0-7) Abnormal (8-28)       Assessment & Plan:     Annual Wellness Visit  Reviewed patient's  Family Medical History Reviewed and updated list of patient's medical providers Assessment of cognitive impairment was done Assessed patient's functional ability Established a written schedule for health screening [place] Completed and Reviewed  Exercise Activities and Dietary recommendations Goals    . Exercise 150 minutes per week (moderate activity)       Immunization History  Administered Date(s) Administered  . Pneumococcal Conjugate-13 04/18/2014  . Pneumococcal Polysaccharide-23 07/24/2004  . Td 09/02/2001  . Tdap 05/25/2011  . Zoster 04/19/2010  Health Maintenance  Topic Date Due  . COLONOSCOPY  05/25/1988  . DEXA SCAN  05/26/2003  . INFLUENZA VACCINE  04/03/2015  . TETANUS/TDAP  05/24/2021  . ZOSTAVAX  Completed  . PNA vac Low Risk Adult  Completed       1. Medicare annual wellness visit, subsequent Stable. Patient advised to continue eating healthy and exercise daily.  2. Essential hypertension Stable. Patient advised to continue current plan of care. F/U pending lab report. - CBC with Differential/Platelet - Comprehensive metabolic panel  3. Anxiety Stable. Patient advised to continue Citalopram as below. - citalopram (CELEXA) 20 MG tablet; Take 1 tablet (20 mg total) by mouth daily.  Dispense: 30 tablet; Refill: 5  4. Hypercholesteremia F/U pending lab report. - Lipid Panel With LDL/HDL Ratio  5. Mild cognitive impairment F/U pending lab report. - TSH  6. Compression fracture of L4 lumbar vertebra, sequela Stable. Patient advised to continue current medication and plan of care.  7. Idiopathic pulmonary fibrosis Stable, followed by Dr. Raul Del.  8. Rash New problem. Patient started on Nystatin as below.  - nystatin ointment (MYCOSTATIN); Apply 1 application topically 2 (two) times daily.  Dispense: 30 g; Refill: 0  9. Encounter for screening mammogram for breast cancer - MM DIGITAL SCREENING BILATERAL; Future  10.  Sleep apnea Patient not using CPAP.     Patient seen and examined by Dr. Jerrell Belfast, and note scribed by Philbert Riser. Dimas, CMA.  I have reviewed the document for accuracy and completeness and I agree with above. Jerrell Belfast, MD

## 2015-04-25 DIAGNOSIS — E780 Pure hypercholesterolemia: Secondary | ICD-10-CM | POA: Diagnosis not present

## 2015-04-25 DIAGNOSIS — G3184 Mild cognitive impairment, so stated: Secondary | ICD-10-CM | POA: Diagnosis not present

## 2015-04-25 DIAGNOSIS — I10 Essential (primary) hypertension: Secondary | ICD-10-CM | POA: Diagnosis not present

## 2015-04-26 LAB — CBC WITH DIFFERENTIAL/PLATELET
Basophils Absolute: 0.1 10*3/uL (ref 0.0–0.2)
Basos: 1 %
EOS (ABSOLUTE): 0.2 10*3/uL (ref 0.0–0.4)
Eos: 4 %
Hematocrit: 40 % (ref 34.0–46.6)
Hemoglobin: 13.4 g/dL (ref 11.1–15.9)
Immature Grans (Abs): 0 10*3/uL (ref 0.0–0.1)
Immature Granulocytes: 0 %
Lymphocytes Absolute: 1.5 10*3/uL (ref 0.7–3.1)
Lymphs: 25 %
MCH: 29.8 pg (ref 26.6–33.0)
MCHC: 33.5 g/dL (ref 31.5–35.7)
MCV: 89 fL (ref 79–97)
Monocytes Absolute: 0.6 10*3/uL (ref 0.1–0.9)
Monocytes: 10 %
Neutrophils Absolute: 3.8 10*3/uL (ref 1.4–7.0)
Neutrophils: 60 %
Platelets: 236 10*3/uL (ref 150–379)
RBC: 4.49 x10E6/uL (ref 3.77–5.28)
RDW: 12.7 % (ref 12.3–15.4)
WBC: 6.2 10*3/uL (ref 3.4–10.8)

## 2015-04-26 LAB — COMPREHENSIVE METABOLIC PANEL
ALT: 24 IU/L (ref 0–32)
AST: 42 IU/L — ABNORMAL HIGH (ref 0–40)
Albumin/Globulin Ratio: 1.6 (ref 1.1–2.5)
Albumin: 3.9 g/dL (ref 3.5–4.8)
Alkaline Phosphatase: 91 IU/L (ref 39–117)
BUN/Creatinine Ratio: 22 (ref 11–26)
BUN: 15 mg/dL (ref 8–27)
Bilirubin Total: 0.6 mg/dL (ref 0.0–1.2)
CO2: 24 mmol/L (ref 18–29)
Calcium: 9.5 mg/dL (ref 8.7–10.3)
Chloride: 101 mmol/L (ref 97–108)
Creatinine, Ser: 0.69 mg/dL (ref 0.57–1.00)
GFR calc Af Amer: 98 mL/min/{1.73_m2} (ref >59)
GFR calc non Af Amer: 85 mL/min/{1.73_m2} (ref >59)
Globulin, Total: 2.5 g/dL (ref 1.5–4.5)
Glucose: 76 mg/dL (ref 65–99)
Potassium: 4.2 mmol/L (ref 3.5–5.2)
Sodium: 143 mmol/L (ref 134–144)
Total Protein: 6.4 g/dL (ref 6.0–8.5)

## 2015-04-26 LAB — LIPID PANEL WITH LDL/HDL RATIO
Cholesterol, Total: 182 mg/dL (ref 100–199)
HDL: 59 mg/dL (ref >39)
LDL Calculated: 97 mg/dL (ref 0–99)
LDl/HDL Ratio: 1.6 ratio units (ref 0.0–3.2)
Triglycerides: 128 mg/dL (ref 0–149)
VLDL Cholesterol Cal: 26 mg/dL (ref 5–40)

## 2015-04-26 LAB — TSH: TSH: 2.1 u[IU]/mL (ref 0.450–4.500)

## 2015-04-27 ENCOUNTER — Ambulatory Visit (INDEPENDENT_AMBULATORY_CARE_PROVIDER_SITE_OTHER): Payer: Medicare Other | Admitting: Obstetrics and Gynecology

## 2015-04-27 ENCOUNTER — Encounter: Payer: Self-pay | Admitting: Obstetrics and Gynecology

## 2015-04-27 ENCOUNTER — Telehealth: Payer: Self-pay | Admitting: Obstetrics and Gynecology

## 2015-04-27 ENCOUNTER — Telehealth: Payer: Self-pay

## 2015-04-27 VITALS — BP 109/70 | HR 50 | Resp 16 | Ht 60.0 in | Wt 148.5 lb

## 2015-04-27 DIAGNOSIS — N952 Postmenopausal atrophic vaginitis: Secondary | ICD-10-CM

## 2015-04-27 DIAGNOSIS — N898 Other specified noninflammatory disorders of vagina: Secondary | ICD-10-CM | POA: Diagnosis not present

## 2015-04-27 DIAGNOSIS — Z960 Presence of urogenital implants: Secondary | ICD-10-CM

## 2015-04-27 DIAGNOSIS — Z9889 Other specified postprocedural states: Secondary | ICD-10-CM

## 2015-04-27 MED ORDER — ESTRADIOL 0.1 MG/GM VA CREA: 1.0000 | TOPICAL_CREAM | Freq: Every day | VAGINAL | Status: DC

## 2015-04-27 NOTE — Telephone Encounter (Signed)
-----  Message from Margarita Rana, MD sent at 04/26/2015  1:32 PM EDT ----- Labs stable except for one very mildly elevated liver enzyme, usually resolves on it's own, recommended recheck Met C in 6 to 8 weeks for stability. Thanks.

## 2015-04-27 NOTE — Telephone Encounter (Signed)
LMTCB 04/27/2015   Thanks,   -Mickel Baas

## 2015-04-27 NOTE — Telephone Encounter (Signed)
PT CALLED AND WAS TOLD TO CALL IF THE RX YOU CALLED IN WAS GOING TO COST TO MUCH AND IT COST 128.00, SHE WANTED TO KNOW IF YOU COULD CALL IN SOMETHING ELSE FOR HER.

## 2015-04-27 NOTE — Telephone Encounter (Signed)
Patient advised as below.  

## 2015-04-27 NOTE — Progress Notes (Signed)
  GYNECOLOGY PROGRESS NOTE  Subjective:    Patient ID: [name], female    DOB: Mar 14, 1936, 79 y.o.   MRN: [id number]  HPI  Patient is a 79 y.o. G2P2 female who presents for 2 week pessary check.  Still noting a discharge (unsure if urinary or vaginal) but notes a slight yellow tinge in pantyliner.  nks that this may be related to pessary use.  Has initiated use of Trimo-San gel which cuts down on odor.   The following portions of the patient's history were reviewed and updated as appropriate: allergies, current medications, past family history, past medical history, past social history, past surgical history and problem list.  Review of Systems A comprehensive review of systems was negative except for: Genitourinary: positive for vaginal discharge and hesitancy   Objective:   Blood pressure 109/70, pulse 50, resp. rate 16, height 5' (1.524 m), weight 148 lb 8 oz (67.359 kg). General appearance: alert and no distress Pelvic: external genitalia normal, with moderate atrophy of urethra.  Pessary in place.  No leakage of urine on cough or Valsalva with pessary in or out.  Pessary removed to reveal a small amount of yellow-tinged discharge (no odor) at vaginal introitus and in vagina. Moderate vaginal atrophy present. Cervix appears wnl. Speculum with small amount of yellow-tinged discharge, no lesions or erosion of vaginal tissue. Bimanual exam not done.  Extremities: extremities normal, atraumatic, no cyanosis or edema Neurologic: Grossly normal   Assessment:   Vaginal discharge.  Vaginal atrophy.  Plan:   Again no evidence of urinary leakage.   Likely secondary to vaginal discharge due to foreign body (pessary).  Advised on continuation of Trimo-San gel to cut down on bacteria and discharge. Will initiate Estrace cream for vaginal atrophy (samples given).  RTC in 6 weeks.    Rubie Maid, MD [facility]

## 2015-04-28 NOTE — Telephone Encounter (Signed)
Please let patient know that I will look into this and if need be call in something different.

## 2015-05-01 NOTE — Telephone Encounter (Signed)
LM FOR PT LETTING HER KNOW

## 2015-05-03 NOTE — Telephone Encounter (Signed)
Per Dr.Cherry I looked into more cost effective options for Estrace for this patient. Pt is not eligible for Estrace discount card as she has Medicare. Pt could have RX compounded however this is approx $50 and pt is unsure if she could afford even that (Estrace is $128.00 with pt's insurance). Pt desires for me to speak with physician further about the need for this medication before having it sent in. Advised pt that provider was out of the office for the remainder of the week, however I would get back with her asap.

## 2015-05-04 NOTE — Telephone Encounter (Signed)
No it is not necessary, but is preferable.  If she cannot afford it, she can come by to get more samples up to the time of her pessary visit, and at that time we can discuss other options.

## 2015-05-12 NOTE — Telephone Encounter (Signed)
Called pt informed of her of Dr.Cherry's message below. Pt gave verbal understanding states she will come get samples after she has used her current supply.

## 2015-05-22 DIAGNOSIS — M5137 Other intervertebral disc degeneration, lumbosacral region: Secondary | ICD-10-CM | POA: Diagnosis not present

## 2015-05-22 DIAGNOSIS — M5126 Other intervertebral disc displacement, lumbar region: Secondary | ICD-10-CM | POA: Diagnosis not present

## 2015-05-22 DIAGNOSIS — M47896 Other spondylosis, lumbar region: Secondary | ICD-10-CM | POA: Diagnosis not present

## 2015-05-22 DIAGNOSIS — M791 Myalgia: Secondary | ICD-10-CM | POA: Diagnosis not present

## 2015-05-22 DIAGNOSIS — M7061 Trochanteric bursitis, right hip: Secondary | ICD-10-CM | POA: Diagnosis not present

## 2015-05-22 DIAGNOSIS — K5909 Other constipation: Secondary | ICD-10-CM | POA: Diagnosis not present

## 2015-05-22 DIAGNOSIS — M5116 Intervertebral disc disorders with radiculopathy, lumbar region: Secondary | ICD-10-CM | POA: Diagnosis not present

## 2015-05-22 DIAGNOSIS — M4806 Spinal stenosis, lumbar region: Secondary | ICD-10-CM | POA: Diagnosis not present

## 2015-05-22 DIAGNOSIS — M461 Sacroiliitis, not elsewhere classified: Secondary | ICD-10-CM | POA: Diagnosis not present

## 2015-05-29 ENCOUNTER — Telehealth: Payer: Self-pay | Admitting: Family Medicine

## 2015-05-29 NOTE — Telephone Encounter (Signed)
Pt is calling to see if she will need to come in to have a pneumonia shot?  YO#[phone]/PV

## 2015-05-29 NOTE — Telephone Encounter (Signed)
Pt called back b/c she remembered having a pneumonia shot August 2015. Please advise. Thanks TNP

## 2015-06-07 ENCOUNTER — Encounter: Payer: Self-pay | Admitting: Obstetrics and Gynecology

## 2015-06-07 ENCOUNTER — Ambulatory Visit (INDEPENDENT_AMBULATORY_CARE_PROVIDER_SITE_OTHER): Payer: Medicare Other | Admitting: Obstetrics and Gynecology

## 2015-06-07 VITALS — BP 133/73 | HR 60 | Temp 98.3°F | Resp 14 | Ht 60.0 in | Wt 148.1 lb

## 2015-06-07 DIAGNOSIS — N8110 Cystocele, unspecified: Secondary | ICD-10-CM

## 2015-06-07 DIAGNOSIS — R339 Retention of urine, unspecified: Secondary | ICD-10-CM

## 2015-06-07 DIAGNOSIS — IMO0002 Reserved for concepts with insufficient information to code with codable children: Secondary | ICD-10-CM

## 2015-06-07 DIAGNOSIS — Z9289 Personal history of other medical treatment: Secondary | ICD-10-CM | POA: Diagnosis not present

## 2015-06-07 DIAGNOSIS — Z960 Presence of urogenital implants: Secondary | ICD-10-CM

## 2015-06-07 NOTE — Progress Notes (Signed)
GYNECOLOGY PROGRESS NOTE  Subjective:    Patient ID: [name], female    DOB: 1935-10-02, 79 y.o.   MRN: [id number]  HPI  Patient is a 79 y.o. G2P2 female who presents for pessary check for cystocele. She reports no vaginal bleeding, but continues to note water discharge/possible urine leakage. Also reports episodes of incomplete bladder emptying where she empties her bladder, waits 10 seconds, and then begins to urinate again. She denies pelvic discomfort and difficulty urinating or moving her bowels.   The following portions of the patient's history were reviewed and updated as appropriate: allergies, current medications, past family history, past medical history, past social history, past surgical history and problem list.  Review of Systems Pertinent items noted in HPI and remainder of comprehensive ROS otherwise negative.   Objective:   Blood pressure 133/73, pulse 60, temperature 98.3 F (36.8 C), temperature source Oral, resp. rate 14, height 5' (1.524 m), weight 148 lb 1.6 oz (67.178 kg). General appearance: alert and no distress Abdomen: soft, non-tender; bowel sounds normal; no masses,  no organomegaly Plevis: The patient's Size 3 ring with support pessary was removed, cleaned and replaced without complications. Speculum examination revealed normal vaginal mucosa with no lesions or lacerations. Watery thin yellow-white discharge noted in vaginal vault.  No leakage of urine on Valsalva or cough with pessary in or out.  Extremities: extremities normal, atraumatic, no cyanosis or edema and varicose veins noted Neurologic: Grossly normal   Assessment:   Pessary in situ Cystocele Incomplete bladder emptying  Plan:   The patient should return in 3 months for a pessary check and continue to use Trimosan gel weekly as prescribed. Was given samples of estrogen cream last visit which have helped some, but notes prescription is too expensive.  Discussion had on incomplete  bladder emptying, mentioned medications that could help.  Patient desires to hold off on medications for now.    Rubie Maid, MD [facility]

## 2015-06-08 ENCOUNTER — Ambulatory Visit: Payer: Medicare Other | Admitting: Obstetrics and Gynecology

## 2015-06-21 ENCOUNTER — Ambulatory Visit (INDEPENDENT_AMBULATORY_CARE_PROVIDER_SITE_OTHER): Payer: Medicare Other

## 2015-06-21 DIAGNOSIS — Z23 Encounter for immunization: Secondary | ICD-10-CM | POA: Diagnosis not present

## 2015-06-24 ENCOUNTER — Ambulatory Visit: Payer: Medicare Other

## 2015-08-08 ENCOUNTER — Ambulatory Visit (INDEPENDENT_AMBULATORY_CARE_PROVIDER_SITE_OTHER): Payer: Medicare Other | Admitting: Obstetrics and Gynecology

## 2015-08-08 VITALS — BP 146/78 | HR 52 | Ht 60.0 in | Wt 149.7 lb

## 2015-08-08 DIAGNOSIS — Z960 Presence of urogenital implants: Secondary | ICD-10-CM

## 2015-08-08 DIAGNOSIS — N7689 Other specified inflammation of vagina and vulva: Secondary | ICD-10-CM | POA: Diagnosis not present

## 2015-08-08 DIAGNOSIS — L309 Dermatitis, unspecified: Secondary | ICD-10-CM

## 2015-08-08 DIAGNOSIS — Z9289 Personal history of other medical treatment: Secondary | ICD-10-CM | POA: Diagnosis not present

## 2015-08-08 DIAGNOSIS — B373 Candidiasis of vulva and vagina: Secondary | ICD-10-CM | POA: Diagnosis not present

## 2015-08-08 DIAGNOSIS — K5900 Constipation, unspecified: Secondary | ICD-10-CM | POA: Diagnosis not present

## 2015-08-08 DIAGNOSIS — R319 Hematuria, unspecified: Secondary | ICD-10-CM | POA: Diagnosis not present

## 2015-08-08 DIAGNOSIS — B3731 Acute candidiasis of vulva and vagina: Secondary | ICD-10-CM

## 2015-08-08 LAB — POCT URINALYSIS DIPSTICK
Bilirubin, UA: NEGATIVE
Glucose, UA: NEGATIVE
Ketones, UA: NEGATIVE
Nitrite, UA: NEGATIVE
Protein, UA: NEGATIVE
Spec Grav, UA: 1.01
Urobilinogen, UA: NEGATIVE
pH, UA: 7.5

## 2015-08-08 MED ORDER — DOCUSATE SODIUM 100 MG PO CAPS
100.0000 mg | ORAL_CAPSULE | Freq: Two times a day (BID) | ORAL | Status: DC | PRN
Start: 2015-08-08 — End: 2015-12-14

## 2015-08-08 MED ORDER — FLUCONAZOLE 150 MG PO TABS: 150.0000 mg | ORAL_TABLET | Freq: Once | ORAL | Status: DC

## 2015-08-08 NOTE — Progress Notes (Signed)
GYNECOLOGY CLINIC PROGRESS NOTE  Subjective:     [name] is a 79 y.o.G1P2002 female who presents for evaluation of an abnormal vaginal discharge.  Notes that she has had vulvar itching, erythema, and tenderness associated.  Symptoms have been present for 4 weeks. Vaginal symptoms: discharge described as white and thin, and no odor. Denies urinary symptoms.  Of note, patient with vaginal pessary in situ.   Patient also complains of difficulty passing stool.  Notes that she often times passes gas first, then has a BM.  Sometimes feels as though she has incomplete emptying, and has to go again 1 hr later.  Notes moderate abdominal pain if unable to pass stool.   The following portions of the patient's history were reviewed and updated as appropriate: allergies, current medications, past family history, past medical history, past social history, past surgical history and problem list.   Review of Systems Pertinent items noted in HPI and remainder of comprehensive ROS otherwise negative.    Objective:    BP 146/78 mmHg  Pulse 52  Ht 5' (1.524 m)  Wt 149 lb 11.2 oz (67.903 kg)  BMI 29.24 kg/m2 General appearance: alert and no distress Abdomen: soft, non-tender; bowel sounds normal; no masses,  no organomegaly Pelvic:    Bladder no bladder distension noted  Urethra: normal appearing urethra with no masses, tenderness or lesions  Vulva: vulvar erythema present bilaterally, involving labia minora and inner half of majora.  No tenderness, masses.   Vagina: normal appearing vagina with normal color and discharge, no lesions.  Size 3 ring with support pessary removed, cleaned, and replaced without difficulty.   Cervix: not indicated and normal appearing cervix without discharge or lesions  Uterus: uterus is normal size, shape, consistency and nontender  Adnexa: not indicated  Rectal: normal rectal, no masses Extremities: extremities normal, atraumatic, no cyanosis or edema     Labs:     Results for orders placed or performed in visit on 08/08/15  POCT urinalysis dipstick  Result Value Ref Range   Color, UA Yellow    Clarity, UA Cloudy    Glucose, UA neg    Bilirubin, UA neg    Ketones, UA neg    Spec Grav, UA 1.010    Blood, UA small    pH, UA 7.5    Protein, UA neg    Urobilinogen, UA negative    Nitrite, UA neg    Leukocytes, UA large (3+) (A) Negative   Microscopic wet-mount exam shows KOH done, hyphae present.  No trichomonads, clue cells, WBCs.  Assessment:    Monilial vulvo-vaginitis.   Dermatitis (patient wears pantyliners)  Hematuria (small)  Vaginal pessary in situ Constipation (mild) Plan:    Symptomatic local care discussed. Oral antifungal (Diflucan otdered).   Advised on OTC hydrocortisone cream for dermatitis.  Encouraged to change pantyliners regularly, avoid scented products.  Hematuria (small amount).  Unsure if vaginal or urinary source.  Will send for culture.  Patient without urinary symptoms.  Vaginal pessary - pessary cleaned, reinserted.  Continue to use Trimosan gel (patient notes that she stopped using it as she thought this was the reason for her discharge.   Advised on colace for constipation.  Patient notes that she does not drink much water as she doesn't like to, and discussed dietary ways to increase fiber. Will prescribe Colace.  RTC in 8 weeks.

## 2015-08-09 ENCOUNTER — Encounter: Payer: Self-pay | Admitting: Obstetrics and Gynecology

## 2015-08-10 ENCOUNTER — Telehealth: Payer: Self-pay

## 2015-08-10 DIAGNOSIS — N390 Urinary tract infection, site not specified: Secondary | ICD-10-CM

## 2015-08-10 DIAGNOSIS — A491 Streptococcal infection, unspecified site: Secondary | ICD-10-CM

## 2015-08-10 LAB — URINE CULTURE

## 2015-08-10 MED ORDER — AMOXICILLIN 500 MG PO TABS: 500.0000 mg | ORAL_TABLET | Freq: Two times a day (BID) | ORAL | Status: DC

## 2015-08-10 NOTE — Telephone Encounter (Signed)
Informed pt of the information below. Pt gave verbal understanding.

## 2015-08-10 NOTE — Telephone Encounter (Signed)
LM for pt to call back. RX sent in.

## 2015-08-10 NOTE — Telephone Encounter (Signed)
-----   Message from Rubie Maid, MD sent at 08/10/2015  8:22 AM EST ----- Patient with GBS UTI.  Needs treatment with Amoxicillin 500 mg BID x 7 days.

## 2015-08-21 DIAGNOSIS — M5137 Other intervertebral disc degeneration, lumbosacral region: Secondary | ICD-10-CM | POA: Diagnosis not present

## 2015-08-21 DIAGNOSIS — M4806 Spinal stenosis, lumbar region: Secondary | ICD-10-CM | POA: Diagnosis not present

## 2015-08-21 DIAGNOSIS — M47896 Other spondylosis, lumbar region: Secondary | ICD-10-CM | POA: Diagnosis not present

## 2015-08-21 DIAGNOSIS — K5909 Other constipation: Secondary | ICD-10-CM | POA: Diagnosis not present

## 2015-08-21 DIAGNOSIS — M461 Sacroiliitis, not elsewhere classified: Secondary | ICD-10-CM | POA: Diagnosis not present

## 2015-08-21 DIAGNOSIS — M5126 Other intervertebral disc displacement, lumbar region: Secondary | ICD-10-CM | POA: Diagnosis not present

## 2015-08-21 DIAGNOSIS — M791 Myalgia: Secondary | ICD-10-CM | POA: Diagnosis not present

## 2015-08-21 DIAGNOSIS — M7061 Trochanteric bursitis, right hip: Secondary | ICD-10-CM | POA: Diagnosis not present

## 2015-08-21 DIAGNOSIS — M5116 Intervertebral disc disorders with radiculopathy, lumbar region: Secondary | ICD-10-CM | POA: Diagnosis not present

## 2015-08-21 DIAGNOSIS — Z79891 Long term (current) use of opiate analgesic: Secondary | ICD-10-CM | POA: Diagnosis not present

## 2015-09-07 ENCOUNTER — Ambulatory Visit: Payer: Medicare Other | Admitting: Obstetrics and Gynecology

## 2015-09-25 DIAGNOSIS — H01003 Unspecified blepharitis right eye, unspecified eyelid: Secondary | ICD-10-CM | POA: Diagnosis not present

## 2015-10-03 ENCOUNTER — Encounter: Payer: Self-pay | Admitting: Obstetrics and Gynecology

## 2015-10-03 ENCOUNTER — Ambulatory Visit (INDEPENDENT_AMBULATORY_CARE_PROVIDER_SITE_OTHER): Payer: Medicare Other | Admitting: Obstetrics and Gynecology

## 2015-10-03 VITALS — BP 131/75 | HR 64 | Ht 60.0 in | Wt 148.1 lb

## 2015-10-03 DIAGNOSIS — Z9289 Personal history of other medical treatment: Secondary | ICD-10-CM

## 2015-10-03 DIAGNOSIS — Z960 Presence of urogenital implants: Secondary | ICD-10-CM

## 2015-10-03 DIAGNOSIS — IMO0002 Reserved for concepts with insufficient information to code with codable children: Secondary | ICD-10-CM

## 2015-10-03 DIAGNOSIS — N8110 Cystocele, unspecified: Secondary | ICD-10-CM | POA: Diagnosis not present

## 2015-10-03 NOTE — Progress Notes (Signed)
  GYNECOLOGY PROGRESS NOTE  Subjective:    Patient ID: [name], female    DOB: [birth year], 80 y.o.   MRN: [id number]  HPI  Patient is a 80 y.o. G2P2 female who presents for pessary check for cystocele. She reports no vaginal bleeding, and notes improvement in discharge.  She denies pelvic discomfort and difficulty urinating or moving her bowels.   The following portions of the patient's history were reviewed and updated as appropriate: allergies, current medications, past family history, past medical history, past social history, past surgical history and problem list.  Review of Systems Pertinent items noted in HPI and remainder of comprehensive ROS otherwise negative.   Objective:   Blood pressure 131/75, pulse 64, height 5' (1.524 m), weight 148 lb 1.6 oz (67.178 kg). General appearance: alert and no distress Abdomen: soft, non-tender; bowel sounds normal; no masses,  no organomegaly Plevis: The patient's Size 3 ring with support pessary was removed, cleaned and replaced without complications. Speculum examination revealed normal vaginal mucosa with no lesions or lacerations. Watery thin yellow-white discharge noted in vaginal vault.  No leakage of urine on Valsalva or cough with pessary in or out.  Extremities: extremities normal, atraumatic, no cyanosis or edema and varicose veins noted Neurologic: Grossly normal   Assessment:   Pessary in situ Cystocele  Plan:   The patient should return in 2 months for a pessary check and continue to use Trimosan gel weekly as prescribed.    Rubie Maid, MD [facility]

## 2015-10-24 DIAGNOSIS — J8410 Pulmonary fibrosis, unspecified: Secondary | ICD-10-CM | POA: Diagnosis not present

## 2015-10-24 DIAGNOSIS — R0602 Shortness of breath: Secondary | ICD-10-CM | POA: Diagnosis not present

## 2015-10-24 DIAGNOSIS — G4733 Obstructive sleep apnea (adult) (pediatric): Secondary | ICD-10-CM | POA: Diagnosis not present

## 2015-10-24 DIAGNOSIS — R0902 Hypoxemia: Secondary | ICD-10-CM | POA: Diagnosis not present

## 2015-11-28 ENCOUNTER — Ambulatory Visit: Payer: Medicare Other | Admitting: Obstetrics and Gynecology

## 2015-12-02 DIAGNOSIS — M25512 Pain in left shoulder: Secondary | ICD-10-CM | POA: Diagnosis not present

## 2015-12-06 DIAGNOSIS — M5116 Intervertebral disc disorders with radiculopathy, lumbar region: Secondary | ICD-10-CM | POA: Diagnosis not present

## 2015-12-06 DIAGNOSIS — M5126 Other intervertebral disc displacement, lumbar region: Secondary | ICD-10-CM | POA: Diagnosis not present

## 2015-12-06 DIAGNOSIS — M47896 Other spondylosis, lumbar region: Secondary | ICD-10-CM | POA: Diagnosis not present

## 2015-12-06 DIAGNOSIS — K5909 Other constipation: Secondary | ICD-10-CM | POA: Diagnosis not present

## 2015-12-06 DIAGNOSIS — M461 Sacroiliitis, not elsewhere classified: Secondary | ICD-10-CM | POA: Diagnosis not present

## 2015-12-06 DIAGNOSIS — M7061 Trochanteric bursitis, right hip: Secondary | ICD-10-CM | POA: Diagnosis not present

## 2015-12-06 DIAGNOSIS — M791 Myalgia: Secondary | ICD-10-CM | POA: Diagnosis not present

## 2015-12-06 DIAGNOSIS — M5137 Other intervertebral disc degeneration, lumbosacral region: Secondary | ICD-10-CM | POA: Diagnosis not present

## 2015-12-06 DIAGNOSIS — M4806 Spinal stenosis, lumbar region: Secondary | ICD-10-CM | POA: Diagnosis not present

## 2015-12-14 ENCOUNTER — Encounter: Payer: Self-pay | Admitting: Obstetrics and Gynecology

## 2015-12-14 ENCOUNTER — Ambulatory Visit (INDEPENDENT_AMBULATORY_CARE_PROVIDER_SITE_OTHER): Payer: Medicare Other | Admitting: Obstetrics and Gynecology

## 2015-12-14 VITALS — BP 117/63 | HR 58 | Ht 60.0 in | Wt 147.0 lb

## 2015-12-14 DIAGNOSIS — Z960 Presence of urogenital implants: Secondary | ICD-10-CM

## 2015-12-14 DIAGNOSIS — Z9289 Personal history of other medical treatment: Secondary | ICD-10-CM | POA: Diagnosis not present

## 2015-12-14 DIAGNOSIS — N814 Uterovaginal prolapse, unspecified: Secondary | ICD-10-CM | POA: Diagnosis not present

## 2015-12-14 DIAGNOSIS — Z4689 Encounter for fitting and adjustment of other specified devices: Secondary | ICD-10-CM | POA: Diagnosis not present

## 2015-12-14 DIAGNOSIS — N898 Other specified noninflammatory disorders of vagina: Secondary | ICD-10-CM | POA: Diagnosis not present

## 2015-12-18 NOTE — Progress Notes (Signed)
  GYNECOLOGY PROGRESS NOTE  Subjective:    Patient ID: [name], female    DOB: [birth year], 80 y.o.   MRN: [id number]  HPI  Patient is a 80 y.o. G2P2 female who presents for pessary check for cystocele. She reports no vaginal bleeding, but still notes vaginal discharge (n o odor). Notes an increase in discharge since last visit.  States that she has cut use of Trimo-San gel to once every few weeks, but still notes heavy discharge. She denies pelvic discomfort and difficulty urinating or moving her bowels.  The following portions of the patient's history were reviewed and updated as appropriate: allergies, current medications, past family history, past medical history, past social history, past surgical history and problem list.  Review of Systems Pertinent items noted in HPI and remainder of comprehensive ROS otherwise negative.   Objective:   Blood pressure 117/63, pulse 58, height 5' (1.524 m), weight 147 lb (66.679 kg). General appearance: alert and no distress Abdomen: soft, non-tender; bowel sounds normal; no masses,  no organomegaly Plevis: The patient's Size 3 ring with support pessary was removed, cleaned and replaced without complications. Speculum examination revealed normal vaginal mucosa with no lesions or lacerations. Watery thin yellow-white discharge noted in vaginal vault.  No leakage of urine on Valsalva or cough with pessary in or out.  Extremities: extremities normal, atraumatic, no cyanosis or edema and varicose veins noted Neurologic: Grossly normal   Microscopic wet-mount exam shows negative for pathogens, normal epithelial cells.   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  Discussed that vaginal discharge was physiologic, and was due to pessary being considered "foreign".  Advised that patient could  occasionally use a douche (water or vinegar only) to clear out excess discharge).    Rubie Maid, MD [facility]

## 2015-12-25 DIAGNOSIS — M25512 Pain in left shoulder: Secondary | ICD-10-CM | POA: Diagnosis not present

## 2015-12-27 DIAGNOSIS — M25512 Pain in left shoulder: Secondary | ICD-10-CM | POA: Diagnosis not present

## 2016-01-01 DIAGNOSIS — M25512 Pain in left shoulder: Secondary | ICD-10-CM | POA: Diagnosis not present

## 2016-01-04 DIAGNOSIS — M25512 Pain in left shoulder: Secondary | ICD-10-CM | POA: Diagnosis not present

## 2016-01-08 DIAGNOSIS — M25512 Pain in left shoulder: Secondary | ICD-10-CM | POA: Diagnosis not present

## 2016-01-10 DIAGNOSIS — M25512 Pain in left shoulder: Secondary | ICD-10-CM | POA: Diagnosis not present

## 2016-01-15 DIAGNOSIS — M25512 Pain in left shoulder: Secondary | ICD-10-CM | POA: Diagnosis not present

## 2016-01-17 ENCOUNTER — Encounter: Payer: Self-pay | Admitting: Family Medicine

## 2016-01-17 ENCOUNTER — Ambulatory Visit (INDEPENDENT_AMBULATORY_CARE_PROVIDER_SITE_OTHER): Payer: Medicare Other | Admitting: Family Medicine

## 2016-01-17 VITALS — BP 124/76 | HR 72 | Temp 98.2°F | Resp 16 | Wt 147.0 lb

## 2016-01-17 DIAGNOSIS — B379 Candidiasis, unspecified: Secondary | ICD-10-CM | POA: Diagnosis not present

## 2016-01-17 DIAGNOSIS — T148 Other injury of unspecified body region: Secondary | ICD-10-CM | POA: Diagnosis not present

## 2016-01-17 DIAGNOSIS — R21 Rash and other nonspecific skin eruption: Secondary | ICD-10-CM | POA: Diagnosis not present

## 2016-01-17 DIAGNOSIS — IMO0002 Reserved for concepts with insufficient information to code with codable children: Secondary | ICD-10-CM

## 2016-01-17 DIAGNOSIS — F419 Anxiety disorder, unspecified: Secondary | ICD-10-CM | POA: Diagnosis not present

## 2016-01-17 MED ORDER — NYSTATIN 100000 UNIT/GM EX POWD: Freq: Two times a day (BID) | CUTANEOUS | Status: DC

## 2016-01-17 MED ORDER — NYSTATIN 100000 UNIT/GM EX OINT: 1.0000 "application " | TOPICAL_OINTMENT | Freq: Two times a day (BID) | CUTANEOUS | Status: DC

## 2016-01-17 MED ORDER — CITALOPRAM HYDROBROMIDE 20 MG PO TABS: 20.0000 mg | ORAL_TABLET | Freq: Every day | ORAL | Status: DC

## 2016-01-17 NOTE — Progress Notes (Addendum)
Patient ID: [name], female   DOB: [birth year], 80 y.o.   MRN: [id number]        Patient: [name] Female    DOB: November 29, 1935   80 y.o.   MRN: [id number] Visit Date: 01/17/2016  Today's Provider: Margarita Rana, MD   Chief Complaint  Patient presents with  . Laceration   Subjective:    Laceration  The incident occurred more than 1 week ago. The laceration is located on the abdomen. The patient is experiencing no pain. She reports no foreign bodies present.  Did try Nystatin in other places. Helped but not great.  Does take her other medication without difficulty.   Is concerned that is erythema and rash under her panus.     Also needs her Celexa refilled. Is doing well. Feels her anxiety is under good control. Mood stable today. No side effects.       No Known Allergies Previous Medications   ALENDRONATE (FOSAMAX) 10 MG TABLET    Take 10 mg by mouth once a week. Take with a full glass of water on an empty stomach.   ASPIRIN 81 MG CHEWABLE TABLET    Chew by mouth.   CALCIUM CITRATE-VITAMIN D (CITRACAL+D) 315-200 MG-UNIT TABLET    Take 1 tablet by mouth 2 (two) times daily.   CYANOCOBALAMIN ([facility] B12) 2500 MCG CHEW    Chew by mouth.   HYDROMORPHONE (DILAUDID) 2 MG TABLET    Take 2 mg by mouth every 4 (four) hours as needed for severe pain.   POTASSIUM GLUCONATE 595 MG CAPS    Take by mouth.    Review of Systems  Constitutional: Negative.   Skin: Positive for wound. Negative for color change, pallor and rash.    Social History  Substance Use Topics  . Smoking status: Never Smoker   . Smokeless tobacco: Never Used  . Alcohol Use: No   Objective:   BP 124/76 mmHg  Pulse 72  Temp(Src) 98.2 F (36.8 C) (Oral)  Resp 16  Wt 147 lb (66.679 kg)  Physical Exam  Constitutional: She is oriented to person, place, and time. She appears well-developed and well-nourished.  Neurological: She is alert and oriented to person, place, and time.  Skin: Skin is warm and  dry.  1) 1 1/2cm by 1/2 cm lesion with a central scab on right breast.   2)  Linear excoriated lesions bilaterally. Some bleeding and erythema around it.     Psychiatric: She has a normal mood and affect. Her behavior is normal. Judgment and thought content normal.      Assessment & Plan:     1. Laceration Suspect related to tinea.    2. Anxiety Condition is stable. Please continue current medication and  plan of care as noted.   - citalopram (CELEXA) 20 MG tablet; Take 1 tablet (20 mg total) by mouth daily.  Dispense: 90 tablet; Refill: 3  3. Rash New problem. Suspect tinea. Will treat with ointment and change to powder when heals to prevent recurrence.   - nystatin ointment (MYCOSTATIN); Apply 1 application topically 2 (two) times daily.  Dispense: 30 g; Refill: 1 - nystatin (MYCOSTATIN) powder; Apply topically 2 (two) times daily.  Dispense: 30 g; Refill: 5  4. Candidiasis As above.   - nystatin ointment (MYCOSTATIN); Apply 1 application topically 2 (two) times daily.  Dispense: 30 g; Refill: 1 - nystatin (MYCOSTATIN) powder; Apply topically 2 (two) times daily.  Dispense: 30 g; Refill: 5  Patient was seen and examined by Jerrell Belfast, MD, and note scribed by Ashley Royalty, CMA.  I have reviewed the document for accuracy and completeness and I agree with above. - Jerrell Belfast, MD   Margarita Rana, MD  [facility]

## 2016-01-18 DIAGNOSIS — M25512 Pain in left shoulder: Secondary | ICD-10-CM | POA: Diagnosis not present

## 2016-01-22 ENCOUNTER — Telehealth: Payer: Self-pay | Admitting: Family Medicine

## 2016-01-22 DIAGNOSIS — M25512 Pain in left shoulder: Secondary | ICD-10-CM | POA: Diagnosis not present

## 2016-01-22 DIAGNOSIS — R748 Abnormal levels of other serum enzymes: Secondary | ICD-10-CM

## 2016-01-22 NOTE — Telephone Encounter (Signed)
LMTCB. sd

## 2016-01-22 NOTE — Telephone Encounter (Signed)
Patient stated that you gave a Nystatin cream she wanted to know if it was for the laceration on the groin area or something different  Please advise

## 2016-01-22 NOTE — Telephone Encounter (Signed)
error 

## 2016-01-22 NOTE — Telephone Encounter (Signed)
For groin area.  Should be ointment and then powder. Thanks.

## 2016-01-22 NOTE — Telephone Encounter (Signed)
Patient advised as below.  

## 2016-01-23 DIAGNOSIS — R748 Abnormal levels of other serum enzymes: Secondary | ICD-10-CM | POA: Diagnosis not present

## 2016-01-24 ENCOUNTER — Telehealth: Payer: Self-pay

## 2016-01-24 DIAGNOSIS — M25512 Pain in left shoulder: Secondary | ICD-10-CM | POA: Diagnosis not present

## 2016-01-24 DIAGNOSIS — R899 Unspecified abnormal finding in specimens from other organs, systems and tissues: Secondary | ICD-10-CM

## 2016-01-24 LAB — COMPREHENSIVE METABOLIC PANEL
ALT: 18 IU/L (ref 0–32)
AST: 24 IU/L (ref 0–40)
Albumin/Globulin Ratio: 1.5 (ref 1.2–2.2)
Albumin: 4 g/dL (ref 3.5–4.8)
Alkaline Phosphatase: 88 IU/L (ref 39–117)
BUN/Creatinine Ratio: 15 (ref 12–28)
BUN: 13 mg/dL (ref 8–27)
Bilirubin Total: 0.7 mg/dL (ref 0.0–1.2)
CO2: 26 mmol/L (ref 18–29)
Calcium: 9.9 mg/dL (ref 8.7–10.3)
Chloride: 102 mmol/L (ref 96–106)
Creatinine, Ser: 0.85 mg/dL (ref 0.57–1.00)
GFR calc Af Amer: 76 mL/min/{1.73_m2} (ref >59)
GFR calc non Af Amer: 66 mL/min/{1.73_m2} (ref >59)
Globulin, Total: 2.7 g/dL (ref 1.5–4.5)
Glucose: 91 mg/dL (ref 65–99)
Potassium: 5.9 mmol/L — ABNORMAL HIGH (ref 3.5–5.2)
Sodium: 144 mmol/L (ref 134–144)
Total Protein: 6.7 g/dL (ref 6.0–8.5)

## 2016-01-24 NOTE — Telephone Encounter (Signed)
-----   Message from Margarita Rana, MD sent at 01/24/2016  6:38 AM EDT ----- Potassium elevated, but think may be lab error. Please have patient repeat labs at no charge. Thanks.

## 2016-01-24 NOTE — Telephone Encounter (Signed)
Left message to call back  

## 2016-01-24 NOTE — Telephone Encounter (Signed)
Advised patient as below. Patient will have labs repeated. Lab slip printed.

## 2016-01-25 DIAGNOSIS — R899 Unspecified abnormal finding in specimens from other organs, systems and tissues: Secondary | ICD-10-CM | POA: Diagnosis not present

## 2016-01-26 ENCOUNTER — Telehealth: Payer: Self-pay | Admitting: Family Medicine

## 2016-01-26 ENCOUNTER — Telehealth: Payer: Self-pay

## 2016-01-26 LAB — COMPREHENSIVE METABOLIC PANEL
ALT: 15 IU/L (ref 0–32)
AST: 22 IU/L (ref 0–40)
Albumin/Globulin Ratio: 1.4 (ref 1.2–2.2)
Albumin: 3.7 g/dL (ref 3.5–4.8)
Alkaline Phosphatase: 86 IU/L (ref 39–117)
BUN/Creatinine Ratio: 15 (ref 12–28)
BUN: 11 mg/dL (ref 8–27)
Bilirubin Total: 0.7 mg/dL (ref 0.0–1.2)
CO2: 26 mmol/L (ref 18–29)
Calcium: 9.5 mg/dL (ref 8.7–10.3)
Chloride: 102 mmol/L (ref 96–106)
Creatinine, Ser: 0.73 mg/dL (ref 0.57–1.00)
GFR calc Af Amer: 92 mL/min/{1.73_m2} (ref >59)
GFR calc non Af Amer: 80 mL/min/{1.73_m2} (ref >59)
Globulin, Total: 2.6 g/dL (ref 1.5–4.5)
Glucose: 101 mg/dL — ABNORMAL HIGH (ref 65–99)
Potassium: 4 mmol/L (ref 3.5–5.2)
Sodium: 142 mmol/L (ref 134–144)
Total Protein: 6.3 g/dL (ref 6.0–8.5)

## 2016-01-26 NOTE — Telephone Encounter (Signed)
LMTCB 01/26/2016  Thanks,   -Mickel Baas

## 2016-01-26 NOTE — Telephone Encounter (Signed)
-----   Message from Margarita Rana, MD sent at 01/26/2016  7:06 AM EDT ----- Labs stable. Please notify patient. Thanks.

## 2016-01-26 NOTE — Telephone Encounter (Signed)
Pt advised.   Thanks,   -Vinny Taranto  

## 2016-01-26 NOTE — Telephone Encounter (Signed)
Opened in error

## 2016-01-26 NOTE — Telephone Encounter (Signed)
Pt returned call

## 2016-02-05 DIAGNOSIS — M5137 Other intervertebral disc degeneration, lumbosacral region: Secondary | ICD-10-CM | POA: Diagnosis not present

## 2016-02-05 DIAGNOSIS — M7061 Trochanteric bursitis, right hip: Secondary | ICD-10-CM | POA: Diagnosis not present

## 2016-02-05 DIAGNOSIS — M461 Sacroiliitis, not elsewhere classified: Secondary | ICD-10-CM | POA: Diagnosis not present

## 2016-02-05 DIAGNOSIS — K5909 Other constipation: Secondary | ICD-10-CM | POA: Diagnosis not present

## 2016-02-05 DIAGNOSIS — M5126 Other intervertebral disc displacement, lumbar region: Secondary | ICD-10-CM | POA: Diagnosis not present

## 2016-02-05 DIAGNOSIS — M5116 Intervertebral disc disorders with radiculopathy, lumbar region: Secondary | ICD-10-CM | POA: Diagnosis not present

## 2016-02-05 DIAGNOSIS — M791 Myalgia: Secondary | ICD-10-CM | POA: Diagnosis not present

## 2016-02-05 DIAGNOSIS — M47896 Other spondylosis, lumbar region: Secondary | ICD-10-CM | POA: Diagnosis not present

## 2016-02-05 DIAGNOSIS — M4806 Spinal stenosis, lumbar region: Secondary | ICD-10-CM | POA: Diagnosis not present

## 2016-02-08 DIAGNOSIS — M461 Sacroiliitis, not elsewhere classified: Secondary | ICD-10-CM | POA: Diagnosis not present

## 2016-02-08 DIAGNOSIS — M47817 Spondylosis without myelopathy or radiculopathy, lumbosacral region: Secondary | ICD-10-CM | POA: Diagnosis not present

## 2016-02-22 ENCOUNTER — Ambulatory Visit: Payer: Medicare Other | Admitting: Obstetrics and Gynecology

## 2016-02-29 ENCOUNTER — Encounter: Payer: Self-pay | Admitting: Obstetrics and Gynecology

## 2016-02-29 ENCOUNTER — Ambulatory Visit (INDEPENDENT_AMBULATORY_CARE_PROVIDER_SITE_OTHER): Payer: Medicare Other | Admitting: Obstetrics and Gynecology

## 2016-02-29 VITALS — BP 120/69 | HR 63 | Ht 60.0 in | Wt 146.6 lb

## 2016-02-29 DIAGNOSIS — Z9289 Personal history of other medical treatment: Secondary | ICD-10-CM

## 2016-02-29 DIAGNOSIS — Z960 Presence of urogenital implants: Secondary | ICD-10-CM

## 2016-02-29 DIAGNOSIS — N814 Uterovaginal prolapse, unspecified: Secondary | ICD-10-CM

## 2016-02-29 DIAGNOSIS — H353131 Nonexudative age-related macular degeneration, bilateral, early dry stage: Secondary | ICD-10-CM | POA: Diagnosis not present

## 2016-02-29 NOTE — Progress Notes (Signed)
    GYNECOLOGY PROGRESS NOTE  Subjective:    Patient ID: [name], female    DOB: 1935/11/20, 80 y.o.   MRN: [id number]  HPI  Patient is a 80 y.o. G2P2 female who presents for pessary check for cystocele. She reports no vaginal bleeding, and notes improvement in discharge.  She denies pelvic discomfort and difficulty urinating or moving her bowels.   The following portions of the patient's history were reviewed and updated as appropriate: allergies, current medications, past family history, past medical history, past social history, past surgical history and problem list.  Review of Systems Pertinent items noted in HPI and remainder of comprehensive ROS otherwise negative.   Objective:   Blood pressure 120/69, pulse 63, height 5' (1.524 m), weight 146 lb 9.6 oz (66.497 kg). General appearance: alert and no distress Abdomen: soft, non-tender; bowel sounds normal; no masses,  no organomegaly Plevis: The patient's Size 3 ring with support pessary was removed, cleaned and replaced without complications. Speculum examination revealed normal vaginal mucosa with no lesions or lacerations. Watery thin yellow-white discharge noted in vaginal vault.  No leakage of urine on Valsalva or cough with pessary in or out.  Extremities: extremities normal, atraumatic, no cyanosis or edema and varicose veins noted Neurologic: Grossly normal   Assessment:   Pessary in situ Cystocele  Plan:   The patient should return in 2 months for a pessary check and continue to use Trimosan gel weekly as prescribed.    Rubie Maid, MD [facility]

## 2016-03-26 ENCOUNTER — Telehealth: Payer: Self-pay | Admitting: Obstetrics and Gynecology

## 2016-03-26 NOTE — Telephone Encounter (Signed)
Pt calls and states that she was concerned about a duplicate medication on her AVS. Advised pt that Nystatin is a duplicate however this is intentional as it is a powder form and ointment form. Pt gave verbal understanding.

## 2016-03-26 NOTE — Telephone Encounter (Signed)
Patient called with questions regarding her medications that were printed out on her AVS at her previous visit. She would like a call back to discuss this. Thanks

## 2016-03-27 DIAGNOSIS — G4733 Obstructive sleep apnea (adult) (pediatric): Secondary | ICD-10-CM | POA: Diagnosis not present

## 2016-03-27 DIAGNOSIS — R0609 Other forms of dyspnea: Secondary | ICD-10-CM | POA: Diagnosis not present

## 2016-03-27 DIAGNOSIS — J8410 Pulmonary fibrosis, unspecified: Secondary | ICD-10-CM | POA: Diagnosis not present

## 2016-03-27 DIAGNOSIS — R0602 Shortness of breath: Secondary | ICD-10-CM | POA: Diagnosis not present

## 2016-03-27 DIAGNOSIS — R05 Cough: Secondary | ICD-10-CM | POA: Diagnosis not present

## 2016-04-01 ENCOUNTER — Other Ambulatory Visit: Payer: Self-pay | Admitting: Specialist

## 2016-04-01 DIAGNOSIS — R0609 Other forms of dyspnea: Principal | ICD-10-CM

## 2016-04-09 ENCOUNTER — Ambulatory Visit
Admission: RE | Admit: 2016-04-09 | Discharge: 2016-04-09 | Disposition: A | Discharge status: Home / Self Care | Payer: Medicare Other | Source: Ambulatory Visit | Attending: Specialist | Admitting: Specialist

## 2016-04-09 DIAGNOSIS — J479 Bronchiectasis, uncomplicated: Secondary | ICD-10-CM | POA: Insufficient documentation

## 2016-04-09 DIAGNOSIS — J8410 Pulmonary fibrosis, unspecified: Secondary | ICD-10-CM | POA: Diagnosis not present

## 2016-04-09 DIAGNOSIS — I700 Atherosclerosis of aorta: Secondary | ICD-10-CM | POA: Diagnosis not present

## 2016-04-09 DIAGNOSIS — J849 Interstitial pulmonary disease, unspecified: Secondary | ICD-10-CM | POA: Insufficient documentation

## 2016-04-09 DIAGNOSIS — R0609 Other forms of dyspnea: Secondary | ICD-10-CM

## 2016-04-09 DIAGNOSIS — I2510 Atherosclerotic heart disease of native coronary artery without angina pectoris: Secondary | ICD-10-CM | POA: Insufficient documentation

## 2016-04-09 DIAGNOSIS — R609 Edema, unspecified: Secondary | ICD-10-CM | POA: Insufficient documentation

## 2016-04-09 DIAGNOSIS — R0602 Shortness of breath: Secondary | ICD-10-CM | POA: Diagnosis not present

## 2016-04-15 DIAGNOSIS — H2513 Age-related nuclear cataract, bilateral: Secondary | ICD-10-CM | POA: Diagnosis not present

## 2016-04-22 DIAGNOSIS — J8410 Pulmonary fibrosis, unspecified: Secondary | ICD-10-CM | POA: Diagnosis not present

## 2016-04-22 DIAGNOSIS — R0602 Shortness of breath: Secondary | ICD-10-CM | POA: Diagnosis not present

## 2016-04-22 DIAGNOSIS — G4733 Obstructive sleep apnea (adult) (pediatric): Secondary | ICD-10-CM | POA: Diagnosis not present

## 2016-04-22 NOTE — Discharge Instructions (Signed)

## 2016-04-23 DIAGNOSIS — M899 Disorder of bone, unspecified: Secondary | ICD-10-CM | POA: Diagnosis not present

## 2016-04-24 ENCOUNTER — Ambulatory Visit: Payer: Medicare Other | Admitting: Anesthesiology

## 2016-04-24 ENCOUNTER — Encounter
Admission: RE | Disposition: A | Discharge status: Home / Self Care | Payer: Self-pay | Source: Ambulatory Visit | Attending: Ophthalmology

## 2016-04-24 ENCOUNTER — Ambulatory Visit
Admission: RE | Admit: 2016-04-24 | Discharge: 2016-04-24 | Disposition: A | Discharge status: Home / Self Care | Payer: Medicare Other | Source: Ambulatory Visit | Attending: Ophthalmology | Admitting: Ophthalmology

## 2016-04-24 DIAGNOSIS — F419 Anxiety disorder, unspecified: Secondary | ICD-10-CM | POA: Diagnosis not present

## 2016-04-24 DIAGNOSIS — J8410 Pulmonary fibrosis, unspecified: Secondary | ICD-10-CM | POA: Insufficient documentation

## 2016-04-24 DIAGNOSIS — M1990 Unspecified osteoarthritis, unspecified site: Secondary | ICD-10-CM | POA: Diagnosis not present

## 2016-04-24 DIAGNOSIS — Z96612 Presence of left artificial shoulder joint: Secondary | ICD-10-CM | POA: Insufficient documentation

## 2016-04-24 DIAGNOSIS — I10 Essential (primary) hypertension: Secondary | ICD-10-CM | POA: Insufficient documentation

## 2016-04-24 DIAGNOSIS — R062 Wheezing: Secondary | ICD-10-CM | POA: Insufficient documentation

## 2016-04-24 DIAGNOSIS — Z9981 Dependence on supplemental oxygen: Secondary | ICD-10-CM | POA: Insufficient documentation

## 2016-04-24 DIAGNOSIS — H2513 Age-related nuclear cataract, bilateral: Secondary | ICD-10-CM | POA: Diagnosis not present

## 2016-04-24 DIAGNOSIS — H2512 Age-related nuclear cataract, left eye: Secondary | ICD-10-CM | POA: Diagnosis not present

## 2016-04-24 DIAGNOSIS — G4730 Sleep apnea, unspecified: Secondary | ICD-10-CM | POA: Insufficient documentation

## 2016-04-24 HISTORY — DX: Anxiety disorder, unspecified: F41.9

## 2016-04-24 HISTORY — PX: CATARACT EXTRACTION W/PHACO: SHX586

## 2016-04-24 SURGERY — PHACOEMULSIFICATION, CATARACT, WITH IOL INSERTION
Anesthesia: Monitor Anesthesia Care | Site: Eye | Laterality: Left | Wound class: Clean

## 2016-04-24 MED ORDER — FENTANYL CITRATE (PF) 100 MCG/2ML IJ SOLN
INTRAMUSCULAR | Status: DC | PRN
  Administered 2016-04-24 (×2): 50 ug via INTRAVENOUS

## 2016-04-24 MED ORDER — TETRACAINE HCL 0.5 % OP SOLN
1.0000 [drp] | OPHTHALMIC | Status: DC | PRN
  Administered 2016-04-24: 1 [drp] via OPHTHALMIC

## 2016-04-24 MED ORDER — LACTATED RINGERS IV SOLN: INTRAVENOUS | Status: DC

## 2016-04-24 MED ORDER — MIDAZOLAM HCL 2 MG/2ML IJ SOLN
INTRAMUSCULAR | Status: DC | PRN
Start: 2016-04-24 — End: 2016-04-24
  Administered 2016-04-24: 2 mg via INTRAVENOUS

## 2016-04-24 MED ORDER — CEFUROXIME OPHTHALMIC INJECTION 1 MG/0.1 ML
INJECTION | OPHTHALMIC | Status: DC | PRN
  Administered 2016-04-24: 0.1 mL via OPHTHALMIC

## 2016-04-24 MED ORDER — POVIDONE-IODINE 5 % OP SOLN
1.0000 "application " | OPHTHALMIC | Status: DC | PRN
  Administered 2016-04-24: 1 via OPHTHALMIC

## 2016-04-24 MED ORDER — ACETAMINOPHEN 325 MG PO TABS: 325.0000 mg | ORAL_TABLET | ORAL | Status: DC | PRN

## 2016-04-24 MED ORDER — ACETAMINOPHEN 160 MG/5ML PO SOLN: 325.0000 mg | ORAL | Status: DC | PRN

## 2016-04-24 MED ORDER — BRIMONIDINE TARTRATE 0.2 % OP SOLN
OPHTHALMIC | Status: DC | PRN
  Administered 2016-04-24: 1 [drp] via OPHTHALMIC

## 2016-04-24 MED ORDER — BALANCED SALT IO SOLN
INTRAOCULAR | Status: DC | PRN
  Administered 2016-04-24: 1 mL via OPHTHALMIC

## 2016-04-24 MED ORDER — TIMOLOL MALEATE 0.5 % OP SOLN
OPHTHALMIC | Status: DC | PRN
  Administered 2016-04-24: 1 [drp] via OPHTHALMIC

## 2016-04-24 MED ORDER — ARMC OPHTHALMIC DILATING GEL
1.0000 "application " | OPHTHALMIC | Status: DC | PRN
  Administered 2016-04-24 (×2): 1 via OPHTHALMIC

## 2016-04-24 MED ORDER — NA HYALUR & NA CHOND-NA HYALUR 0.4-0.35 ML IO KIT
PACK | INTRAOCULAR | Status: DC | PRN
  Administered 2016-04-24: 1 mL via INTRAOCULAR

## 2016-04-24 MED ORDER — EPINEPHRINE HCL 1 MG/ML IJ SOLN
INTRAOCULAR | Status: DC | PRN
  Administered 2016-04-24: 43 mL via OPHTHALMIC

## 2016-04-24 SURGICAL SUPPLY — 27 items
CANNULA ANT/CHMB 27G (MISCELLANEOUS) ×1 IMPLANT
CANNULA ANT/CHMB 27GA (MISCELLANEOUS) ×3 IMPLANT
CARTRIDGE ABBOTT (MISCELLANEOUS) IMPLANT
GLOVE SURG LX 7.5 STRW (GLOVE) ×4
GLOVE SURG LX STRL 7.5 STRW (GLOVE) ×1 IMPLANT
GLOVE SURG TRIUMPH 8.0 PF LTX (GLOVE) ×3 IMPLANT
GOWN STRL REUS W/ TWL LRG LVL3 (GOWN DISPOSABLE) ×2 IMPLANT
GOWN STRL REUS W/TWL LRG LVL3 (GOWN DISPOSABLE) ×6
LENS IOL TECNIS ITEC 21.5 (Intraocular Lens) ×2 IMPLANT
MARKER SKIN DUAL TIP RULER LAB (MISCELLANEOUS) ×3 IMPLANT
NDL FILTER BLUNT 18X1 1/2 (NEEDLE) ×1 IMPLANT
NDL RETROBULBAR .5 NSTRL (NEEDLE) IMPLANT
NEEDLE FILTER BLUNT 18X 1/2SAF (NEEDLE) ×2
NEEDLE FILTER BLUNT 18X1 1/2 (NEEDLE) ×1 IMPLANT
PACK CATARACT BRASINGTON (MISCELLANEOUS) ×3 IMPLANT
PACK EYE AFTER SURG (MISCELLANEOUS) ×3 IMPLANT
PACK OPTHALMIC (MISCELLANEOUS) ×3 IMPLANT
RING MALYGIN 7.0 (MISCELLANEOUS) IMPLANT
SUT ETHILON 10-0 CS-B-6CS-B-6 (SUTURE)
SUT VICRYL  9 0 (SUTURE)
SUT VICRYL 9 0 (SUTURE) IMPLANT
SUTURE EHLN 10-0 CS-B-6CS-B-6 (SUTURE) IMPLANT
SYR 3ML LL SCALE MARK (SYRINGE) ×3 IMPLANT
SYR 5ML LL (SYRINGE) ×3 IMPLANT
SYR TB 1ML LUER SLIP (SYRINGE) ×3 IMPLANT
WATER STERILE IRR 250ML POUR (IV SOLUTION) ×3 IMPLANT
WIPE NON LINTING 3.25X3.25 (MISCELLANEOUS) ×3 IMPLANT

## 2016-04-24 NOTE — H&P (Signed)
  The History and Physical notes are on paper, have been signed, and are to be scanned. The patient remains stable and unchanged from the H&P.   Previous H&P reviewed, patient examined, and there are no changes.  [name] 04/24/2016 7:34 AM

## 2016-04-24 NOTE — Anesthesia Procedure Notes (Signed)
Procedure Name: MAC Performed by: Ruthia Person Pre-anesthesia Checklist: Patient identified, Emergency Drugs available, Suction available, Timeout performed and Patient being monitored Patient Re-evaluated:Patient Re-evaluated prior to inductionOxygen Delivery Method: Nasal cannula Placement Confirmation: positive ETCO2     

## 2016-04-24 NOTE — Anesthesia Preprocedure Evaluation (Signed)
Anesthesia Evaluation  Patient identified by MRN, date of birth, ID band Patient awake    Reviewed: Allergy & Precautions, H&P , NPO status , Patient's Chart, lab work & pertinent test results  Airway Mallampati: II  TM Distance: >3 FB Neck ROM: full    Dental no notable dental hx.    Pulmonary shortness of breath, with exertion, at rest and Long-Term Oxygen Therapy, sleep apnea ,  Pulmonary Fibrosis with recent increase in O2 requirement   Pulmonary exam normal        Cardiovascular hypertension, Normal cardiovascular exam     Neuro/Psych    GI/Hepatic   Endo/Other    Renal/GU      Musculoskeletal   Abdominal   Peds  Hematology   Anesthesia Other Findings   Reproductive/Obstetrics                             Anesthesia Physical Anesthesia Plan  ASA: IV  Anesthesia Plan: MAC   Post-op Pain Management:    Induction:   Airway Management Planned:   Additional Equipment:   Intra-op Plan:   Post-operative Plan:   Informed Consent: I have reviewed the patients History and Physical, chart, labs and discussed the procedure including the risks, benefits and alternatives for the proposed anesthesia with the patient or authorized representative who has indicated his/her understanding and acceptance.     Plan Discussed with:   Anesthesia Plan Comments:         Anesthesia Quick Evaluation

## 2016-04-24 NOTE — Anesthesia Postprocedure Evaluation (Signed)
Anesthesia Post Note  Patient: [name]  Procedure(s) Performed: Procedure(s) (LRB): CATARACT EXTRACTION PHACO AND INTRAOCULAR LENS PLACEMENT (IOC) (Left)  Patient location during evaluation: PACU Anesthesia Type: MAC Level of consciousness: awake and alert and oriented Pain management: satisfactory to patient Vital Signs Assessment: post-procedure vital signs reviewed and stable Respiratory status: spontaneous breathing, nonlabored ventilation and respiratory function stable Cardiovascular status: blood pressure returned to baseline and stable Postop Assessment: Adequate PO intake and No signs of nausea or vomiting Anesthetic complications: no    Raliegh Ip

## 2016-04-24 NOTE — Op Note (Signed)
OPERATIVE NOTE  [name] [id number] 04/24/2016   PREOPERATIVE DIAGNOSIS:  Nuclear sclerotic cataract left eye. H25.12   POSTOPERATIVE DIAGNOSIS:    Nuclear sclerotic cataract left eye.     PROCEDURE:  Phacoemusification with posterior chamber intraocular lens placement of the left eye   LENS:   Implant Name Type Inv. Item Serial No. Manufacturer Lot No. LRB No. Used  LENS IOL DIOP 21.5 - [id number] Intraocular Lens LENS IOL DIOP 21.5 [id number] AMO   Left 1        ULTRASOUND TIME: 12  % of 1 minutes 1 seconds, CDE 7.5  SURGEON:  Wyonia Hough, MD   ANESTHESIA:  Topical with tetracaine drops and 2% Xylocaine jelly, augmented with 1% preservative-free intracameral lidocaine.    COMPLICATIONS:  None.   DESCRIPTION OF PROCEDURE:  The patient was identified in the holding room and transported to the operating room and placed in the supine position under the operating microscope.  The left eye was identified as the operative eye and it was prepped and draped in the usual sterile ophthalmic fashion.   A 1 millimeter clear-corneal paracentesis was made at the 1:30 position.  0.5 ml of preservative-free 1% lidocaine was injected into the anterior chamber.  The anterior chamber was filled with Viscoat viscoelastic.  A 2.4 millimeter keratome was used to make a near-clear corneal incision at the 10:30 position.  .  A curvilinear capsulorrhexis was made with a cystotome and capsulorrhexis forceps.  Balanced salt solution was used to hydrodissect and hydrodelineate the nucleus.   Phacoemulsification was then used in stop and chop fashion to remove the lens nucleus and epinucleus.  The remaining cortex was then removed using the irrigation and aspiration handpiece. Provisc was then placed into the capsular bag to distend it for lens placement.  A lens was then injected into the capsular bag.  The remaining viscoelastic was aspirated.   Wounds were hydrated with balanced salt  solution.  The anterior chamber was inflated to a physiologic pressure with balanced salt solution.  No wound leaks were noted. Cefuroxime 0.1 ml of a 2m/ml solution was injected into the anterior chamber for a dose of 1 mg of intracameral antibiotic at the completion of the case.   Timolol and Brimonidine drops were applied to the eye.  The patient was taken to the recovery room in stable condition without complications of anesthesia or surgery.  [name] 04/24/2016, 8:36 AM

## 2016-04-24 NOTE — Transfer of Care (Signed)
Immediate Anesthesia Transfer of Care Note  Patient: [name]  Procedure(s) Performed: Procedure(s): CATARACT EXTRACTION PHACO AND INTRAOCULAR LENS PLACEMENT (IOC) (Left)  Patient Location: PACU  Anesthesia Type: MAC  Level of Consciousness: awake, alert  and patient cooperative  Airway and Oxygen Therapy: Patient Spontanous Breathing and Patient connected to supplemental oxygen  Post-op Assessment: Post-op Vital signs reviewed, Patient's Cardiovascular Status Stable, Respiratory Function Stable, Patent Airway and No signs of Nausea or vomiting  Post-op Vital Signs: Reviewed and stable  Complications: No apparent anesthesia complications

## 2016-04-25 ENCOUNTER — Encounter: Payer: Self-pay | Admitting: Ophthalmology

## 2016-04-25 ENCOUNTER — Encounter: Payer: Self-pay | Admitting: Physician Assistant

## 2016-04-26 ENCOUNTER — Encounter: Payer: Self-pay | Admitting: Physician Assistant

## 2016-04-26 ENCOUNTER — Ambulatory Visit (INDEPENDENT_AMBULATORY_CARE_PROVIDER_SITE_OTHER): Payer: Medicare Other | Admitting: Physician Assistant

## 2016-04-26 VITALS — BP 136/76 | HR 56 | Temp 98.1°F | Resp 12 | Ht 59.5 in | Wt 154.4 lb

## 2016-04-26 DIAGNOSIS — Z1211 Encounter for screening for malignant neoplasm of colon: Secondary | ICD-10-CM

## 2016-04-26 DIAGNOSIS — K5901 Slow transit constipation: Secondary | ICD-10-CM | POA: Diagnosis not present

## 2016-04-26 DIAGNOSIS — Q782 Osteopetrosis: Secondary | ICD-10-CM | POA: Diagnosis not present

## 2016-04-26 DIAGNOSIS — Z800 Family history of malignant neoplasm of digestive organs: Secondary | ICD-10-CM | POA: Insufficient documentation

## 2016-04-26 DIAGNOSIS — B354 Tinea corporis: Secondary | ICD-10-CM | POA: Diagnosis not present

## 2016-04-26 DIAGNOSIS — T148 Other injury of unspecified body region: Secondary | ICD-10-CM | POA: Diagnosis not present

## 2016-04-26 DIAGNOSIS — Z1239 Encounter for other screening for malignant neoplasm of breast: Secondary | ICD-10-CM

## 2016-04-26 DIAGNOSIS — Z0000 Encounter for general adult medical examination without abnormal findings: Secondary | ICD-10-CM

## 2016-04-26 DIAGNOSIS — IMO0002 Reserved for concepts with insufficient information to code with codable children: Secondary | ICD-10-CM

## 2016-04-26 MED ORDER — RISEDRONATE SODIUM 150 MG PO TABS: 150.0000 mg | ORAL_TABLET | ORAL | 4 refills | Status: DC

## 2016-04-26 MED ORDER — CALCIUM CITRATE-VITAMIN D 315-250 MG-UNIT PO TABS: 1.0000 | ORAL_TABLET | Freq: Every day | ORAL | 0 refills | Status: DC

## 2016-04-26 MED ORDER — CLOTRIMAZOLE 1 % EX CREA: 1.0000 "application " | TOPICAL_CREAM | Freq: Two times a day (BID) | CUTANEOUS | 0 refills | Status: DC

## 2016-04-26 MED ORDER — DOCUSATE SODIUM 100 MG PO TABS: 100.0000 mg | ORAL_TABLET | Freq: Two times a day (BID) | ORAL | 0 refills | Status: DC

## 2016-04-26 MED ORDER — CEPHALEXIN 500 MG PO CAPS: 500.0000 mg | ORAL_CAPSULE | Freq: Two times a day (BID) | ORAL | 0 refills | Status: DC

## 2016-04-26 NOTE — Progress Notes (Deleted)
Patient: [name], Female    DOB: 16-Apr-1936, 80 y.o.   MRN: [id number] Visit Date: 04/26/2016  Today's Provider: Mar Daring, PA-C   No chief complaint on file.  Subjective:    Annual wellness visit [name] is a 80 y.o. female. She feels {DESC; WELL/FAIRLY WELL/POORLY:18703}. She reports exercising ***. She reports she is sleeping {DESC; WELL/FAIRLY WELL/POORLY:18703}.  -----------------------------------------------------------  Mammo: 01/26/2014 Bi Rads Cat.1 Negative   ROS:  Social History   Social History  . Marital status: Married    Spouse name: [name]  . Number of children: 2  . Years of education: N/A   Occupational History  . Not on file.   Social History Main Topics  . Smoking status: Never Smoker  . Smokeless tobacco: Never Used  . Alcohol use No  . Drug use: No  . Sexual activity: No   Other Topics Concern  . Not on file   Social History Narrative  . No narrative on file    Past Medical History:  Diagnosis Date  . Altered mental state   . Anxiety   . BMI 30.0-30.9,adult   . Cellulitis   . Chest pain, atypical   . Compression fracture of L4 lumbar vertebra ([place])   . Constipation   . Cystitis   . Cystocele   . Diverticulosis   . Fatigue   . Fibrosis, idiopathic pulmonary ([place])   . Gastroenteritis   . History of back surgery   . History of herniated intervertebral disc   . HTN (hypertension)   . Hypercholesteremia   . Hypocalcemia   . Incontinence of urine   . OA (osteoarthritis)    shoulder and back  . Obesity   . Osteopenia   . Pneumonia   . Shortness of breath   . Sleep apnea   . Thrush   . Vitamin D deficiency     Patient Active Problem List   Diagnosis Date Noted  . Candidiasis 01/17/2016  . Hypercholesteremia 04/24/2015  . Mild cognitive impairment 04/24/2015  . Compression fracture of L4 lumbar vertebra (HCC) 04/24/2015  . Idiopathic pulmonary fibrosis ([place]) 04/24/2015  . Vaginal  pessary in situ 04/19/2015  . Cystocele with uterine descensus 03/30/2015  . Anxiety 02/02/2015  . Arthritis 02/02/2015  . Hypertension 02/02/2015  . Sleep apnea 02/02/2015  . Postinflammatory pulmonary fibrosis ([place]) 05/04/2014    Past Surgical History:  Procedure Laterality Date  . CATARACT EXTRACTION W/PHACO Left 04/24/2016   Procedure: CATARACT EXTRACTION PHACO AND INTRAOCULAR LENS PLACEMENT (IOC);  Surgeon: Leandrew Koyanagi, MD;  Location: [facility] [place] [facility];  Service: Ophthalmology;  Laterality: Left;  . ROTATOR CUFF REPAIR Bilateral    left-12/2009; right-03/2009 dr.califf  . TOTAL SHOULDER REPLACEMENT Left     Her family history includes COPD in her mother; Cancer in her brother.   Family Status  Relation Status  . Mother Deceased at age 91   lung cancer  . Father Deceased at age 35   ruptured aneurysm  . Sister Alive  . Brother Alive    Previous Medications   ASPIRIN 81 MG CHEWABLE TABLET    Chew by mouth.   CALCIUM CITRATE-VITAMIN D (CITRACAL + D PO)    Take 1,200 mg by mouth.   CITALOPRAM (CELEXA) 20 MG TABLET    Take 1 tablet (20 mg total) by mouth daily.   CYANOCOBALAMIN ([facility] B12) 2500 MCG CHEW    Chew by mouth.   HYDROMORPHONE (DILAUDID) 2 MG TABLET  Take 2 mg by mouth 2 (two) times daily.    NYSTATIN (MYCOSTATIN) POWDER    Apply topically 2 (two) times daily.   NYSTATIN OINTMENT (MYCOSTATIN)    Apply 1 application topically 2 (two) times daily.   POTASSIUM GLUCONATE 595 MG CAPS    Take by mouth.    Patient Care Team: Mar Daring, PA-C as PCP - General (Family Medicine)     Objective:   Vitals: There were no vitals taken for this visit.  Exam:  Activities of Daily Living In your present state of health, do you have any difficulty performing the following activities: 04/24/2016  Hearing? N  Vision? N  Difficulty concentrating or making decisions? N  Walking or climbing stairs? N  Dressing or bathing? N  Some recent data might be hidden      Fall Risk Assessment Fall Risk  04/24/2015  Falls in the past year? No     Depression Screen PHQ 2/9 Scores 04/24/2015  PHQ - 2 Score 0    Cognitive Testing - 6-CIT  Correct? Score   What year is it? {yes no:22349} {0-4:31231} 0 or 4  What month is it? {yes no:22349} {0-3:21082} 0 or 3  Memorize:    Pia Mau,  44,  [place],      What time is it? (within 1 hour) {yes no:22349} {0-3:21082} 0 or 3  Count backwards from 20 {yes no:22349} {0-4:31231} 0, 2, or 4  Name the months of the year {yes no:22349} {0-4:31231} 0, 2, or 4  Repeat name & address above {yes no:22349} {0-10:5044} 0, 2, 4, 6, 8, or 10       TOTAL SCORE  ***/28   Interpretation:  {normal/abnormal:11317::"Normal"}  Normal (0-7) Abnormal (8-28)   Audit-C Alcohol Use Screening  Question Answer Points  How often do you have alcoholic drink? {Frequencies:19612::"never"} {Score; 0-4:31231::"0"}  On days you do drink alcohol, how many drinks do you typically consume? *** {Score; 0-4:31231::"0"}  How oftey will you drink 6 or more in a total? {Frequencies:19612::"never"} {Score; 0-4:31231::"0"}  Total Score:  {NUMBERS 0-12:18577::"0"}   A score of 3 or more in women, and 4 or more in men indicates increased risk for alcohol abuse, EXCEPT if all of the points are from question 1.     Assessment & Plan:     Annual Wellness Visit  Reviewed patient's Family Medical History Reviewed and updated list of patient's medical providers Assessment of cognitive impairment was done Assessed patient's functional ability Established a written schedule for health screening [place] Completed and Reviewed  Exercise Activities and Dietary recommendations Goals    . Exercise 150 minutes per week (moderate activity)       Immunization History  Administered Date(s) Administered  . Influenza, High Dose Seasonal PF 06/21/2015  . Pneumococcal Conjugate-13 04/18/2014  . Pneumococcal  Polysaccharide-23 07/24/2004  . Td 09/02/2001  . Tdap 05/25/2011  . Zoster 04/19/2010    Health Maintenance  Topic Date Due  . INFLUENZA VACCINE  04/02/2016  . TETANUS/TDAP  05/24/2021  . DEXA SCAN  Completed  . ZOSTAVAX  Completed  . PNA vac Low Risk Adult  Completed      Discussed health benefits of physical activity, and encouraged her to engage in regular exercise appropriate for her age and condition.    ------------------------------------------------------------------------------------------------------------

## 2016-04-26 NOTE — Progress Notes (Signed)
Patient: [name], Female    DOB: 1935/10/16, 80 y.o.   MRN: [id number] Visit Date: 04/26/2016  Today's Provider: Mar Daring, PA-C   Chief Complaint  Patient presents with  . Medicare Wellness   Subjective:    Annual wellness visit [name] is a 80 y.o. female who presents today for her Subsequent Annual Wellness Visit. She feels well. She reports exercising 3 times per week. She reports she is sleeping well (average 8-9 hours per night).  She does also have a wound on the back of her leg she has been trying to nurse. States she cut it on a piece of metal. She has been applying topical antibiotic ointment without relief. Redness has increased. No drainage. No fevers, chills, nausea or vomiting. Tender to palpation.   She also has a yeast rash located under each breast. She states she has been using nystatin cream and powder without relief.  -----------------------------------------------------------  Mammo: 01/26/2014 BiRads Cat 1 negative    Review of Systems  Constitutional: Positive for appetite change and fatigue.  HENT: Negative.   Eyes: Negative.   Respiratory: Positive for shortness of breath.   Cardiovascular: Negative.   Gastrointestinal: Positive for constipation and diarrhea (occasional). Negative for abdominal pain, blood in stool, nausea, rectal pain and vomiting.  Endocrine: Negative.   Genitourinary: Negative.   Musculoskeletal: Positive for back pain.  Skin: Positive for wound.  Allergic/Immunologic: Negative.   Neurological: Negative.   Hematological: Negative.   Psychiatric/Behavioral: Negative.     Social History   Social History  . Marital status: Married    Spouse name: [name]  . Number of children: 2  . Years of education: N/A   Occupational History  . Not on file.   Social History Main Topics  . Smoking status: Never Smoker  . Smokeless tobacco: Never Used  . Alcohol use No  . Drug use: No  . Sexual activity: No    Other Topics Concern  . Not on file   Social History Narrative  . No narrative on file    Patient Active Problem List   Diagnosis Date Noted  . Candidiasis 01/17/2016  . Hypercholesteremia 04/24/2015  . Mild cognitive impairment 04/24/2015  . Compression fracture of L4 lumbar vertebra (HCC) 04/24/2015  . Idiopathic pulmonary fibrosis ([place]) 04/24/2015  . Vaginal pessary in situ 04/19/2015  . Cystocele with uterine descensus 03/30/2015  . Anxiety 02/02/2015  . Arthritis 02/02/2015  . Hypertension 02/02/2015  . Sleep apnea 02/02/2015  . Postinflammatory pulmonary fibrosis ([place]) 05/04/2014    Past Surgical History:  Procedure Laterality Date  . CATARACT EXTRACTION W/PHACO Left 04/24/2016   Procedure: CATARACT EXTRACTION PHACO AND INTRAOCULAR LENS PLACEMENT (IOC);  Surgeon: Leandrew Koyanagi, MD;  Location: [place];  Service: Ophthalmology;  Laterality: Left;  . ROTATOR CUFF REPAIR Bilateral    left-12/2009; right-03/2009 dr.califf  . TOTAL SHOULDER REPLACEMENT Left     Her family history includes COPD in her mother; Cancer in her brother.    Previous Medications   ASPIRIN 81 MG CHEWABLE TABLET    Chew by mouth.   CITALOPRAM (CELEXA) 20 MG TABLET    Take 1 tablet (20 mg total) by mouth daily.   CYANOCOBALAMIN ([facility]S B12) 2500 MCG CHEW    Chew by mouth.   HYDROMORPHONE (DILAUDID) 2 MG TABLET    Take 2 mg by mouth 2 (two) times daily.    NYSTATIN (MYCOSTATIN) POWDER    Apply topically 2 (two) times daily.  NYSTATIN OINTMENT (MYCOSTATIN)    Apply 1 application topically 2 (two) times daily.    Patient Care Team: Mar Daring, PA-C as PCP - General (Family Medicine)     Objective:   Vitals: BP 136/76 (BP Location: Right Arm, Patient Position: Sitting, Cuff Size: Normal)   Pulse (!) 56   Temp 98.1 F (36.7 C) (Oral)   Resp 12   Ht 4' 11.5" (1.511 m)   Wt 154 lb 6.4 oz (70 kg)   BMI 30.66 kg/m   Physical Exam  Constitutional: She is oriented to  person, place, and time. She appears well-developed and well-nourished. No distress.  HENT:  Head: Normocephalic and atraumatic.  Right Ear: Tympanic membrane, external ear and ear canal normal.  Left Ear: Tympanic membrane, external ear and ear canal normal.  Nose: Nose normal.  Mouth/Throat: Uvula is midline, oropharynx is clear and moist and mucous membranes are normal. No oropharyngeal exudate.  Eyes: Conjunctivae and EOM are normal. Pupils are equal, round, and reactive to light. Right eye exhibits no discharge. Left eye exhibits no discharge. No scleral icterus.  Neck: Normal range of motion. Neck supple. No JVD present. Carotid bruit is not present. No tracheal deviation present. No thyromegaly present.  Cardiovascular: Normal rate, regular rhythm, normal heart sounds and intact distal pulses.  Exam reveals no gallop and no friction rub.   No murmur heard. Pulmonary/Chest: Effort normal. No respiratory distress. She has decreased breath sounds. She has no wheezes. She has no rhonchi. She has no rales. She exhibits no tenderness.  On oxygen 24/7-pulmonary fibrosis  Abdominal: Soft. Bowel sounds are normal. She exhibits no distension and no mass. There is no tenderness. There is no rebound and no guarding.  Musculoskeletal: Normal range of motion. She exhibits no edema or tenderness.  Lymphadenopathy:    She has no cervical adenopathy.  Neurological: She is alert and oriented to person, place, and time.  Skin: Skin is warm and dry. No rash noted. She is not diaphoretic.  Psychiatric: She has a normal mood and affect. Her behavior is normal. Judgment and thought content normal.  Vitals reviewed.   Activities of Daily Living In your present state of health, do you have any difficulty performing the following activities: 04/26/2016 04/24/2016  Hearing? N N  Vision? N N  Difficulty concentrating or making decisions? N N  Walking or climbing stairs? N N  Dressing or bathing? N N  Doing  errands, shopping? N -  Some recent data might be hidden    Fall Risk Assessment Fall Risk  04/26/2016 04/24/2015  Falls in the past year? No No     Depression Screen PHQ 2/9 Scores 04/26/2016 04/24/2015  PHQ - 2 Score 0 0    Cognitive Testing - 6-CIT  Correct? Score   What year is it? yes 0 0 or 4  What month is it? yes 0 0 or 3  Memorize:    Pia Mau,  44,  [place],      What time is it? (within 1 hour) yes 0 0 or 3  Count backwards from 20 yes 0 0, 2, or 4  Name the months of the year yes 0 0, 2, or 4  Repeat name & address above yes 4 0, 2, 4, 6, 8, or 10       TOTAL SCORE  4/28   Interpretation:  Normal  Normal (0-7) Abnormal (8-28)    Audit-C Alcohol Use Screening  Question Answer  Points  How often do you have alcoholic drink? never 0  On days you do drink alcohol, how many drinks do you typically consume? 0 0  How oftey will you drink 6 or more in a total? never 0  Total Score:  0   A score of 3 or more in women, and 4 or more in men indicates increased risk for alcohol abuse, EXCEPT if all of the points are from question 1.     Assessment & Plan:     Annual Wellness Visit  Reviewed patient's Family Medical History Reviewed and updated list of patient's medical providers Assessment of cognitive impairment was done Assessed patient's functional ability Established a written schedule for health screening [place] Completed and Reviewed  Exercise Activities and Dietary recommendations Goals    . Exercise 150 minutes per week (moderate activity)       Immunization History  Administered Date(s) Administered  . Influenza, High Dose Seasonal PF 06/21/2015  . Pneumococcal Conjugate-13 04/18/2014  . Pneumococcal Polysaccharide-23 07/24/2004  . Td 09/02/2001  . Tdap 05/25/2011  . Zoster 04/19/2010    Health Maintenance  Topic Date Due  . INFLUENZA VACCINE  04/02/2016  . TETANUS/TDAP  05/24/2021  . DEXA SCAN   Completed  . ZOSTAVAX  Completed  . PNA vac Low Risk Adult  Completed      Discussed health benefits of physical activity, and encouraged her to engage in regular exercise appropriate for her age and condition.    1. Medicare annual wellness visit, subsequent Normal physical exam today.  2. Colon cancer screening Brother had colon cancer, father had unknown type of cancer that caused death, patient is having increased constipation. She has had previous colonoscopies with Dr. Vira Agar. Discussed she may not be a great candidate for colonoscopy but may be worthwhile to discuss other options that may be available. If no other option patient does want to have cologuard screening done. - Ambulatory referral to Gastroenterology  3. Breast cancer screening Mammogram ordered as below. Last mammogram was 01/26/14 and benign:1. - MM Digital Screening; Future  4. Osteopetrosis Recent BMD showed progressive osteoporosis. She had discontinued her Vit D3 and calcium supplements. Will restart that and add actonel once monthly. She is to call if she does not tolerate the medication.  - Calcium Citrate-Vitamin D (CALCIUM CITRATE + D3 MAXIMUM) 315-250 MG-UNIT TABS; Take 1 tablet by mouth daily.  Dispense: 90 tablet; Refill: 0 - risedronate (ACTONEL) 150 MG tablet; Take 1 tablet (150 mg total) by mouth every 30 (thirty) days. with water on empty stomach, nothing by mouth or lie down for next 30 minutes.  Dispense: 3 tablet; Refill: 4  5. Tinea corporis Will change nystain to clotrimazole as below to see if this offers better relief. She is to call if no improvement.  - clotrimazole (LOTRIMIN) 1 % cream; Apply 1 application topically 2 (two) times daily.  Dispense: 60 g; Refill: 0  6. Laceration Laceration on posterior left lower leg with a small area of surrounding cellulitis. Will treat with keflex as below. She is to call if no improvement.  - cephALEXin (KEFLEX) 500 MG capsule; Take 1 capsule (500 mg  total) by mouth 2 (two) times daily.  Dispense: 14 capsule; Refill: 0  7. Slow transit constipation Advised to add stool softener more regularly since she is taking Dilaudid daily and does not drink much water do to difficulty getting it down. Also referral to GI made as above.  - Docusate  Sodium 100 MG capsule; Take 1 tablet (100 mg total) by mouth 2 (two) times daily.  Dispense: 10 tablet; Refill: 0  8. Family history of colon cancer requiring screening colonoscopy See above medical treatment plan for #2. - Ambulatory referral to Gastroenterology  ------------------------------------------------------------------------------------------------------------

## 2016-04-26 NOTE — Patient Instructions (Signed)

## 2016-05-02 ENCOUNTER — Telehealth: Payer: Self-pay | Admitting: Physician Assistant

## 2016-05-02 DIAGNOSIS — R938 Abnormal findings on diagnostic imaging of other specified body structures: Secondary | ICD-10-CM | POA: Diagnosis not present

## 2016-05-02 DIAGNOSIS — E669 Obesity, unspecified: Secondary | ICD-10-CM | POA: Diagnosis not present

## 2016-05-02 DIAGNOSIS — J8410 Pulmonary fibrosis, unspecified: Secondary | ICD-10-CM | POA: Diagnosis not present

## 2016-05-02 DIAGNOSIS — R0902 Hypoxemia: Secondary | ICD-10-CM | POA: Diagnosis not present

## 2016-05-02 DIAGNOSIS — I208 Other forms of angina pectoris: Secondary | ICD-10-CM | POA: Diagnosis not present

## 2016-05-02 DIAGNOSIS — R0602 Shortness of breath: Secondary | ICD-10-CM | POA: Diagnosis not present

## 2016-05-02 DIAGNOSIS — I2510 Atherosclerotic heart disease of native coronary artery without angina pectoris: Secondary | ICD-10-CM | POA: Diagnosis not present

## 2016-05-02 DIAGNOSIS — M545 Low back pain: Secondary | ICD-10-CM | POA: Diagnosis not present

## 2016-05-02 NOTE — Telephone Encounter (Signed)
Pt called and gave verbal release to fax her CMP to [facility].  Sharen Counter faxed the last CMP on 05/02/16.  Thanks Con Memos

## 2016-05-03 ENCOUNTER — Other Ambulatory Visit: Payer: Self-pay | Admitting: Family Medicine

## 2016-05-03 ENCOUNTER — Telehealth: Payer: Self-pay | Admitting: Physician Assistant

## 2016-05-03 DIAGNOSIS — IMO0002 Reserved for concepts with insufficient information to code with codable children: Secondary | ICD-10-CM

## 2016-05-03 MED ORDER — CEPHALEXIN 500 MG PO CAPS: 500.0000 mg | ORAL_CAPSULE | Freq: Two times a day (BID) | ORAL | 0 refills | Status: DC

## 2016-05-03 NOTE — Telephone Encounter (Signed)
Pt states she seen Tawanna Sat last week for an infection on her leg.  Pt states she will be out of medication today.  Pt states he leg is better but she does not feel it is completely better. Pt states her leg is still really red.  Pt is asking it she needs another Rx?  [facility].  BU#[phone]/HO

## 2016-05-03 NOTE — Telephone Encounter (Signed)
I have sent in antibiotic for another 7 days

## 2016-05-03 NOTE — Telephone Encounter (Signed)
Patient has been advised. KW 

## 2016-05-03 NOTE — Telephone Encounter (Signed)
Please review. Thanks!  

## 2016-05-07 DIAGNOSIS — M810 Age-related osteoporosis without current pathological fracture: Secondary | ICD-10-CM | POA: Diagnosis not present

## 2016-05-08 ENCOUNTER — Encounter: Payer: Self-pay | Admitting: Obstetrics and Gynecology

## 2016-05-08 ENCOUNTER — Ambulatory Visit (INDEPENDENT_AMBULATORY_CARE_PROVIDER_SITE_OTHER): Payer: Medicare Other | Admitting: Obstetrics and Gynecology

## 2016-05-08 VITALS — BP 150/76 | HR 52 | Ht 60.0 in | Wt 155.8 lb

## 2016-05-08 DIAGNOSIS — R21 Rash and other nonspecific skin eruption: Secondary | ICD-10-CM | POA: Diagnosis not present

## 2016-05-08 DIAGNOSIS — Z4689 Encounter for fitting and adjustment of other specified devices: Secondary | ICD-10-CM

## 2016-05-08 DIAGNOSIS — IMO0002 Reserved for concepts with insufficient information to code with codable children: Secondary | ICD-10-CM

## 2016-05-08 DIAGNOSIS — N8110 Cystocele, unspecified: Secondary | ICD-10-CM | POA: Diagnosis not present

## 2016-05-08 MED ORDER — OXYQUINOLONE SULFATE 0.025 % VA GEL: 1.0000 | VAGINAL | 1 refills | Status: DC

## 2016-05-08 MED ORDER — TRIAMCINOLONE ACETONIDE 0.5 % EX OINT: 1.0000 "application " | TOPICAL_OINTMENT | Freq: Two times a day (BID) | CUTANEOUS | 1 refills | Status: DC

## 2016-05-08 NOTE — Patient Instructions (Signed)
-   Use Trimo-San gel once every 2 weeks.  - Stop Estrace cream - Use hydrocortisone (steroid cream) twice daily to affected areas.  - Hold Nystatin ointment.  Can continue using Nystatin powder.

## 2016-05-08 NOTE — Progress Notes (Signed)
    GYNECOLOGY PROGRESS NOTE  Subjective:    Patient ID: [name], female    DOB: Jun 09, 1936, 80 y.o.   MRN: [id number]  HPI  Patient is a 80 y.o. G2P2 female who presents for pessary check for cystocele. She reports no vaginal bleeding.  Notes that she is changing a pantyliner 3 x daily due to continued discharge.  She denies pelvic discomfort and difficulty urinating or moving her bowels.  Reports that she is using the Estrace, however notes that vaginal region is still red and itchy.    The following portions of the patient's history were reviewed and updated as appropriate: allergies, current medications, past family history, past medical history, past social history, past surgical history and problem list.  Review of Systems A comprehensive review of systems was negative except for: Integument/breast: positive for rash under bilateral breasts.  Notes using both Nystatin cream and powder with no relief.   Objective:   Blood pressure (!) 150/76, pulse (!) 52, height 5' (1.524 m), weight 155 lb 12.8 oz (70.7 kg).  Repeat BP 142/78.   General appearance: alert and no distress Breasts: denuded skin rash with erythema noted under bilateral breasts and in midline between breasts.  Pelvis: Vulva with hyperemic rash covering both labia majora and minora down towards rectum, nontender.  The patient's Size 3 ring with support pessary was removed, cleaned and replaced without complications.  Speculum examination revealed normal vaginal mucosa with no lesions or lacerations. Watery thin yellow-white discharge noted in vaginal vault.  No leakage of urine on Valsalva or cough with pessary in or out.  Extremities: extremities normal, atraumatic, no cyanosis or edema and varicose veins noted Neurologic: Grossly normal   Assessment:   Pessary in situ Cystocele Skin rash  Plan:   - Advised patient to discontinue using Estrace.  Should return to using Trimo-san gel, but only use once every  other week.   - Will prescribe steroid cream to apply to breast and vulvar region twice daily for at least 1 week.  Discussed changing type of bra (cotton only, no lace) and can continue using powder as needed but to discontinue - Nystatin cream for now.  - The patient should return in 3 months for a pessary check. To return sooner if symptoms worsen.    Rubie Maid, MD [facility]

## 2016-05-18 ENCOUNTER — Ambulatory Visit (INDEPENDENT_AMBULATORY_CARE_PROVIDER_SITE_OTHER): Payer: Medicare Other

## 2016-05-18 DIAGNOSIS — Z23 Encounter for immunization: Secondary | ICD-10-CM

## 2016-05-31 ENCOUNTER — Encounter: Payer: Medicare Other | Admitting: Physician Assistant

## 2016-06-03 DIAGNOSIS — I2510 Atherosclerotic heart disease of native coronary artery without angina pectoris: Secondary | ICD-10-CM | POA: Diagnosis not present

## 2016-06-03 DIAGNOSIS — I208 Other forms of angina pectoris: Secondary | ICD-10-CM | POA: Diagnosis not present

## 2016-06-03 DIAGNOSIS — R0602 Shortness of breath: Secondary | ICD-10-CM | POA: Diagnosis not present

## 2016-06-05 DIAGNOSIS — J84112 Idiopathic pulmonary fibrosis: Secondary | ICD-10-CM | POA: Diagnosis not present

## 2016-06-05 DIAGNOSIS — R0609 Other forms of dyspnea: Secondary | ICD-10-CM | POA: Diagnosis not present

## 2016-06-05 DIAGNOSIS — G4733 Obstructive sleep apnea (adult) (pediatric): Secondary | ICD-10-CM | POA: Diagnosis not present

## 2016-06-05 DIAGNOSIS — R0902 Hypoxemia: Secondary | ICD-10-CM | POA: Diagnosis not present

## 2016-06-06 DIAGNOSIS — R0602 Shortness of breath: Secondary | ICD-10-CM | POA: Diagnosis not present

## 2016-06-06 DIAGNOSIS — E669 Obesity, unspecified: Secondary | ICD-10-CM | POA: Diagnosis not present

## 2016-06-06 DIAGNOSIS — M545 Low back pain: Secondary | ICD-10-CM | POA: Diagnosis not present

## 2016-06-06 DIAGNOSIS — R0902 Hypoxemia: Secondary | ICD-10-CM | POA: Diagnosis not present

## 2016-06-06 DIAGNOSIS — I208 Other forms of angina pectoris: Secondary | ICD-10-CM | POA: Diagnosis not present

## 2016-06-06 DIAGNOSIS — J8410 Pulmonary fibrosis, unspecified: Secondary | ICD-10-CM | POA: Diagnosis not present

## 2016-06-06 DIAGNOSIS — I2510 Atherosclerotic heart disease of native coronary artery without angina pectoris: Secondary | ICD-10-CM | POA: Diagnosis not present

## 2016-06-18 ENCOUNTER — Ambulatory Visit: Payer: Medicare Other

## 2016-06-20 DIAGNOSIS — M5116 Intervertebral disc disorders with radiculopathy, lumbar region: Secondary | ICD-10-CM | POA: Diagnosis not present

## 2016-06-20 DIAGNOSIS — M5137 Other intervertebral disc degeneration, lumbosacral region: Secondary | ICD-10-CM | POA: Diagnosis not present

## 2016-06-20 DIAGNOSIS — M791 Myalgia: Secondary | ICD-10-CM | POA: Diagnosis not present

## 2016-06-20 DIAGNOSIS — Z79891 Long term (current) use of opiate analgesic: Secondary | ICD-10-CM | POA: Diagnosis not present

## 2016-06-20 DIAGNOSIS — M461 Sacroiliitis, not elsewhere classified: Secondary | ICD-10-CM | POA: Diagnosis not present

## 2016-06-20 DIAGNOSIS — G894 Chronic pain syndrome: Secondary | ICD-10-CM | POA: Diagnosis not present

## 2016-06-20 DIAGNOSIS — M47896 Other spondylosis, lumbar region: Secondary | ICD-10-CM | POA: Diagnosis not present

## 2016-06-20 DIAGNOSIS — M48061 Spinal stenosis, lumbar region without neurogenic claudication: Secondary | ICD-10-CM | POA: Diagnosis not present

## 2016-06-20 DIAGNOSIS — M7061 Trochanteric bursitis, right hip: Secondary | ICD-10-CM | POA: Diagnosis not present

## 2016-06-20 DIAGNOSIS — K5909 Other constipation: Secondary | ICD-10-CM | POA: Diagnosis not present

## 2016-06-20 DIAGNOSIS — M5126 Other intervertebral disc displacement, lumbar region: Secondary | ICD-10-CM | POA: Diagnosis not present

## 2016-06-28 DIAGNOSIS — M461 Sacroiliitis, not elsewhere classified: Secondary | ICD-10-CM | POA: Diagnosis not present

## 2016-06-28 DIAGNOSIS — M47816 Spondylosis without myelopathy or radiculopathy, lumbar region: Secondary | ICD-10-CM | POA: Diagnosis not present

## 2016-07-03 DIAGNOSIS — R194 Change in bowel habit: Secondary | ICD-10-CM | POA: Diagnosis not present

## 2016-07-16 DIAGNOSIS — C4492 Squamous cell carcinoma of skin, unspecified: Secondary | ICD-10-CM

## 2016-07-16 DIAGNOSIS — L400 Psoriasis vulgaris: Secondary | ICD-10-CM | POA: Diagnosis not present

## 2016-07-16 DIAGNOSIS — D0439 Carcinoma in situ of skin of other parts of face: Secondary | ICD-10-CM | POA: Diagnosis not present

## 2016-07-16 DIAGNOSIS — D692 Other nonthrombocytopenic purpura: Secondary | ICD-10-CM | POA: Diagnosis not present

## 2016-07-16 DIAGNOSIS — C44320 Squamous cell carcinoma of skin of unspecified parts of face: Secondary | ICD-10-CM | POA: Diagnosis not present

## 2016-07-16 DIAGNOSIS — D485 Neoplasm of uncertain behavior of skin: Secondary | ICD-10-CM | POA: Diagnosis not present

## 2016-07-16 DIAGNOSIS — L820 Inflamed seborrheic keratosis: Secondary | ICD-10-CM | POA: Diagnosis not present

## 2016-07-16 HISTORY — DX: Squamous cell carcinoma of skin, unspecified: C44.92

## 2016-07-17 ENCOUNTER — Ambulatory Visit (INDEPENDENT_AMBULATORY_CARE_PROVIDER_SITE_OTHER): Payer: Medicare Other | Admitting: Obstetrics and Gynecology

## 2016-07-17 VITALS — BP 152/64 | HR 58

## 2016-07-17 DIAGNOSIS — N814 Uterovaginal prolapse, unspecified: Secondary | ICD-10-CM

## 2016-07-17 NOTE — Progress Notes (Signed)
    GYNECOLOGY PROGRESS NOTE  Subjective:    Patient ID: [name], female    DOB: 05/28/1936, 80 y.o.   MRN: [id number]  HPI  Patient is a 80 y.o. G2P2 female who presents for pessary check for cystocele. She reports no vaginal bleeding. She denies pelvic discomfort.  Denies difficulty urinating.  Does note alternating constipation with diarrhea but has h/o bowel dysfunction.   The following portions of the patient's history were reviewed and updated as appropriate: allergies, current medications, past family history, past medical history, past social history, past surgical history and problem list.  Review of Systems Pertinent items noted in HPI and remainder of comprehensive ROS otherwise negative.   Objective:   Blood pressure (!) 152/64, pulse (!) 58.  Repeat BP 142/78.   General appearance: alert and no distress Pelvis: Vulva with slight hyperemic rash at base vulva, much improved from previous visit (using hydrocortisone cream), nontender.  The patient's Size 3 ring with support pessary was removed, cleaned and replaced without complications.  Speculum examination revealed normal vaginal mucosa with no lesions or lacerations. Watery thin yellow-white discharge noted in vaginal vault.  No leakage of urine on Valsalva or cough with pessary in or out.  Extremities: extremities normal, atraumatic, no cyanosis or edema and varicose veins noted    Assessment:   Pessary in situ Cystocele  Plan:   - Continue using Trimo-san gel, but only use once every other week.  .  - The patient should return in 2-3 months for a pessary check.   Rubie Maid, MD [facility]

## 2016-08-07 DIAGNOSIS — J439 Emphysema, unspecified: Secondary | ICD-10-CM | POA: Diagnosis not present

## 2016-08-07 DIAGNOSIS — J84112 Idiopathic pulmonary fibrosis: Secondary | ICD-10-CM | POA: Diagnosis not present

## 2016-08-07 DIAGNOSIS — G4733 Obstructive sleep apnea (adult) (pediatric): Secondary | ICD-10-CM | POA: Diagnosis not present

## 2016-08-07 DIAGNOSIS — R0602 Shortness of breath: Secondary | ICD-10-CM | POA: Diagnosis not present

## 2016-09-12 ENCOUNTER — Ambulatory Visit (INDEPENDENT_AMBULATORY_CARE_PROVIDER_SITE_OTHER): Payer: Medicare Other | Admitting: Obstetrics and Gynecology

## 2016-09-12 ENCOUNTER — Encounter: Payer: Self-pay | Admitting: Obstetrics and Gynecology

## 2016-09-12 VITALS — BP 124/65 | HR 55 | Ht 60.0 in | Wt 156.5 lb

## 2016-09-12 DIAGNOSIS — N898 Other specified noninflammatory disorders of vagina: Secondary | ICD-10-CM | POA: Diagnosis not present

## 2016-09-12 DIAGNOSIS — N8110 Cystocele, unspecified: Secondary | ICD-10-CM | POA: Diagnosis not present

## 2016-09-12 DIAGNOSIS — Z4689 Encounter for fitting and adjustment of other specified devices: Secondary | ICD-10-CM | POA: Diagnosis not present

## 2016-09-12 NOTE — Progress Notes (Signed)
    GYNECOLOGY PROGRESS NOTE  Subjective:    Patient ID: [name], female    DOB: 15-Nov-1935, 81 y.o.   MRN: [id number]  HPI  Patient is a 81 y.o. G2P2 female who presents for pessary check for cystocele. She reports no vaginal bleeding. She denies pelvic discomfort.  Denies difficulty urinating.  Currently denies difficulty passing urine or stools. Still notes having thin discharge from vagina, despite using Trimo-san gel once every week or 2. Denies vaginal itching or burning.   The following portions of the patient's history were reviewed and updated as appropriate: allergies, current medications, past family history, past medical history, past social history, past surgical history and problem list.  Review of Systems Pertinent items noted in HPI and remainder of comprehensive ROS otherwise negative.   Objective:   Blood pressure 124/65, pulse (!) 55, height 5' (1.524 m), weight 156 lb 8 oz (71 kg).  Repeat BP 142/78.   General appearance: alert and no distress Pelvis: Vulva normal appearing, no lesions. Vagina atrophic. The patient's Size 3 ring with support pessary was removed, cleaned and replaced without complications.  Speculum examination revealed normal vaginal mucosa with no lesions or lacerations. Watery thin yellow-white discharge noted in vaginal vault.  Extremities: extremities normal, atraumatic, no cyanosis or edema and varicose veins noted    Assessment:   Pessary in situ Cystocele Vaginal discharge  Plan:   - Continue using Trimo-san gel, but only use once every other week. Advised that the discharge is likely physiologic (as she has had several wet preps performed which have all been negative), and will continue as long as pessary is in place.  Patient notes understanding, states she will continue using pantyliners as needed.   - The patient should return in 2-3 months for a pessary check.  Rubie Maid, MD [facility]    Rubie Maid,  MD [facility]

## 2016-10-11 DIAGNOSIS — Z79899 Other long term (current) drug therapy: Secondary | ICD-10-CM | POA: Diagnosis not present

## 2016-10-14 DIAGNOSIS — M791 Myalgia: Secondary | ICD-10-CM | POA: Diagnosis not present

## 2016-10-14 DIAGNOSIS — M47896 Other spondylosis, lumbar region: Secondary | ICD-10-CM | POA: Diagnosis not present

## 2016-10-14 DIAGNOSIS — M5126 Other intervertebral disc displacement, lumbar region: Secondary | ICD-10-CM | POA: Diagnosis not present

## 2016-10-14 DIAGNOSIS — M461 Sacroiliitis, not elsewhere classified: Secondary | ICD-10-CM | POA: Diagnosis not present

## 2016-10-14 DIAGNOSIS — M5116 Intervertebral disc disorders with radiculopathy, lumbar region: Secondary | ICD-10-CM | POA: Diagnosis not present

## 2016-10-14 DIAGNOSIS — M7061 Trochanteric bursitis, right hip: Secondary | ICD-10-CM | POA: Diagnosis not present

## 2016-10-14 DIAGNOSIS — M5137 Other intervertebral disc degeneration, lumbosacral region: Secondary | ICD-10-CM | POA: Diagnosis not present

## 2016-10-14 DIAGNOSIS — K5909 Other constipation: Secondary | ICD-10-CM | POA: Diagnosis not present

## 2016-10-14 DIAGNOSIS — M48061 Spinal stenosis, lumbar region without neurogenic claudication: Secondary | ICD-10-CM | POA: Diagnosis not present

## 2016-10-31 DIAGNOSIS — M47816 Spondylosis without myelopathy or radiculopathy, lumbar region: Secondary | ICD-10-CM | POA: Diagnosis not present

## 2016-10-31 DIAGNOSIS — M47817 Spondylosis without myelopathy or radiculopathy, lumbosacral region: Secondary | ICD-10-CM | POA: Diagnosis not present

## 2016-11-06 DIAGNOSIS — G4733 Obstructive sleep apnea (adult) (pediatric): Secondary | ICD-10-CM | POA: Diagnosis not present

## 2016-11-06 DIAGNOSIS — R0609 Other forms of dyspnea: Secondary | ICD-10-CM | POA: Diagnosis not present

## 2016-11-06 DIAGNOSIS — J8410 Pulmonary fibrosis, unspecified: Secondary | ICD-10-CM | POA: Diagnosis not present

## 2016-12-05 DIAGNOSIS — H353131 Nonexudative age-related macular degeneration, bilateral, early dry stage: Secondary | ICD-10-CM | POA: Diagnosis not present

## 2016-12-11 ENCOUNTER — Encounter: Payer: Medicare Other | Admitting: Obstetrics and Gynecology

## 2016-12-12 ENCOUNTER — Encounter: Payer: Self-pay | Admitting: Obstetrics and Gynecology

## 2016-12-12 ENCOUNTER — Encounter: Payer: Self-pay | Admitting: Physician Assistant

## 2016-12-12 ENCOUNTER — Ambulatory Visit (INDEPENDENT_AMBULATORY_CARE_PROVIDER_SITE_OTHER): Payer: Medicare Other | Admitting: Obstetrics and Gynecology

## 2016-12-12 ENCOUNTER — Ambulatory Visit (INDEPENDENT_AMBULATORY_CARE_PROVIDER_SITE_OTHER): Payer: Medicare Other | Admitting: Physician Assistant

## 2016-12-12 VITALS — BP 165/82 | HR 55 | Ht 60.0 in | Wt 153.3 lb

## 2016-12-12 VITALS — HR 60 | Temp 97.9°F | Resp 16 | Wt 155.4 lb

## 2016-12-12 DIAGNOSIS — N898 Other specified noninflammatory disorders of vagina: Secondary | ICD-10-CM

## 2016-12-12 DIAGNOSIS — F419 Anxiety disorder, unspecified: Secondary | ICD-10-CM | POA: Diagnosis not present

## 2016-12-12 DIAGNOSIS — Z4689 Encounter for fitting and adjustment of other specified devices: Secondary | ICD-10-CM

## 2016-12-12 DIAGNOSIS — R030 Elevated blood-pressure reading, without diagnosis of hypertension: Secondary | ICD-10-CM | POA: Diagnosis not present

## 2016-12-12 DIAGNOSIS — N8110 Cystocele, unspecified: Secondary | ICD-10-CM

## 2016-12-12 DIAGNOSIS — I10 Essential (primary) hypertension: Secondary | ICD-10-CM | POA: Diagnosis not present

## 2016-12-12 NOTE — Progress Notes (Signed)
Patient: [name] Female    DOB: July 04, 1936   81 y.o.   MRN: [id number] Visit Date: 12/12/2016  Today's Provider: Mar Daring, PA-C   Chief Complaint  Patient presents with  . Elevated Blood Pressure   Subjective:    HPI   Hypertension: Patient is here for evaluation of elevated blood pressures. Patient reports that her blood pressure was elevated this morning when she went to [facility]  165/82.Cardiac symptoms dizziness last week only. Patient denies chest pain, chest pressure/discomfort, claudication, fatigue, irregular heart beat, lower extremity edema, near-syncope and palpitations.  Cardiovascular risk factors: advanced age (older than 64 for men, 40 for women), dyslipidemia and hypertension.   She does report that she is having increased anxiety due to family issues and also is having increased back pain. She is going to call the orthopedic clinic for another appt to discuss her increasing pain.     No Known Allergies   Current Outpatient Prescriptions:  .  aspirin 81 MG chewable tablet, Chew by mouth., Disp: , Rfl:  .  betamethasone valerate (VALISONE) 0.1 % cream, , Disp: , Rfl:  .  Calcium Citrate-Vitamin D (CALCIUM CITRATE + D3 MAXIMUM) 315-250 MG-UNIT TABS, Take 1 tablet by mouth daily., Disp: 90 tablet, Rfl: 0 .  citalopram (CELEXA) 20 MG tablet, Take 1 tablet (20 mg total) by mouth daily., Disp: 90 tablet, Rfl: 3 .  HYDROmorphone (DILAUDID) 2 MG tablet, Take 2 mg by mouth 2 (two) times daily. , Disp: , Rfl:  .  Nintedanib (OFEV) 100 MG CAPS, 2 (two) times daily. , Disp: , Rfl:  .  nystatin (MYCOSTATIN) powder, Apply topically 2 (two) times daily., Disp: 30 g, Rfl: 5 .  ondansetron (ZOFRAN) 4 MG tablet, , Disp: , Rfl:  .  OXYQUINOLONE SULFATE VAGINAL (TRIMO-SAN) 0.025 % GEL, Place 1 Applicatorful vaginally every 14 (fourteen) days., Disp: 45 g, Rfl: 1 .  promethazine (PHENERGAN) 12.5 MG tablet, TAKE ONE TABLET BY MOUTH EVERY 6 HOURS AS NEEDED,  Disp: , Rfl:  .  triamcinolone ointment (KENALOG) 0.5 %, Apply 1 application topically 2 (two) times daily., Disp: 30 g, Rfl: 1 .  zoledronic acid (RECLAST) 5 MG/100ML SOLN injection, Inject into the vein., Disp: , Rfl:  .  Docusate Sodium 100 MG capsule, Take 1 tablet (100 mg total) by mouth 2 (two) times daily. (Patient not taking: Reported on 12/12/2016), Disp: 10 tablet, Rfl: 0  Review of Systems  Constitutional: Negative for fatigue and unexpected weight change.  Eyes: Negative for visual disturbance.  Respiratory: Negative for cough, chest tightness and shortness of breath.   Cardiovascular: Negative for chest pain, palpitations and leg swelling.  Gastrointestinal: Negative for abdominal pain.  Musculoskeletal: Positive for gait problem.  Neurological: Positive for dizziness (last week. Feels is related to her gait problem) and headaches (sometimes in the mornings when she gets up). Negative for light-headedness.    Social History  Substance Use Topics  . Smoking status: Never Smoker  . Smokeless tobacco: Never Used  . Alcohol use No   Objective:   Pulse 60   Temp 97.9 F (36.6 C) (Oral)   Resp 16   Wt 155 lb 6.4 oz (70.5 kg) Comment: Per patient this AM at the other office was 153 lbs  BMI 30.35 kg/m  Vitals:   12/12/16 1554  Pulse: 60  Resp: 16  Temp: 97.9 F (36.6 C)  TempSrc: Oral  Weight: 155 lb 6.4 oz (70.5 kg)  Physical Exam  Constitutional: She appears well-developed and well-nourished. No distress.  Neck: Normal range of motion. Neck supple. No JVD present. No tracheal deviation present. No thyromegaly present.  Cardiovascular: Normal rate, regular rhythm and normal heart sounds.  Exam reveals no gallop and no friction rub.   No murmur heard. Pulmonary/Chest: Effort normal and breath sounds normal. No respiratory distress. She has no wheezes. She has no rales.  Musculoskeletal: She exhibits no edema.  Lymphadenopathy:    She has no cervical adenopathy.   Skin: She is not diaphoretic.  Vitals reviewed.     Assessment & Plan:     1. Elevated blood pressure reading Patient's BP was checked multiple times today in the office and readings averaged around 140-144/72-80 bilaterally. Advised patient to purchase a cuff to check BP at home. She has reported increased stress and anxiety recently. She is also having increased back pain. She is going to check her BP all next week and call with readings. If BP consistently 150/80 or greater, I will add an ACE/ARB to treat and then see her back in 4 weeks to recheck. If home readings are lower then we will just continue to monitor.  2. Anxiety Worsening. Patient would not discuss in detail, just stated that she is having to make hard decisions. Advised patient she may increase her citalopram to 1.5 tab (30 mg) daily if needed. She is to call if anxiety continues to worsen.        Mar Daring, PA-C  [facility]

## 2016-12-12 NOTE — Progress Notes (Signed)
    GYNECOLOGY PROGRESS NOTE  Subjective:    Patient ID: [name], female    DOB: 1935-11-07, 81 y.o.   MRN: [id number]  HPI  Patient is a 81 y.o. G2P2 female who presents for pessary check for cystocele. She reports no vaginal bleeding. She denies pelvic discomfort.  Denies difficulty urinating.  Currently denies difficulty passing urine or stools. Notes discharge, but considers this normal, as there is no burning or itching.   The following portions of the patient's history were reviewed and updated as appropriate: allergies, current medications, past family history, past medical history, past social history, past surgical history and problem list.  Review of Systems Pertinent items noted in HPI and remainder of comprehensive ROS otherwise negative.   Objective:   Blood pressure (!) 165/82, pulse (!) 55, height 5' (1.524 m), weight 153 lb 4.8 oz (69.5 kg).  Repeat BP 142/78.   General appearance: alert and no distress Pelvis: Vulva normal appearing, no lesions. Vagina atrophic. The pati ent's Size 3 ring with support pessary was removed, cleaned and replaced without complications.  Speculum examination revealed normal vaginal mucosa with no lesions or lacerations. Watery thin yellow-white discharge noted in vaginal vault.  Extremities: extremities normal, atraumatic, no cyanosis or edema and varicose veins noted   Assessment:   Pessary in situ Cystocele Leukorrhea Hypertension  Plan:   - Continue using Trimo-san gel as previously prescribed (every other week). Continued to reassure that discharge was likely physiologic.  Patient notes understanding, states she will continue using pantyliners as needed.   - The patient should return in 2-3 months for a pessary check. - Hypertension, uncontrolled today.  Patient notes that she is taking medications as prescribed, denies symptoms. Should f/u with PCP as scheduled.   Rubie Maid, MD [facility]    Rubie Maid, MD [facility]

## 2016-12-12 NOTE — Patient Instructions (Signed)

## 2016-12-26 ENCOUNTER — Telehealth: Payer: Self-pay | Admitting: Physician Assistant

## 2016-12-26 DIAGNOSIS — I10 Essential (primary) hypertension: Secondary | ICD-10-CM

## 2016-12-26 MED ORDER — LISINOPRIL 5 MG PO TABS: 5.0000 mg | ORAL_TABLET | Freq: Every day | ORAL | 1 refills | Status: DC

## 2016-12-26 NOTE — Telephone Encounter (Signed)
LMTCB ED 

## 2016-12-26 NOTE — Telephone Encounter (Signed)
BP readings are borderline high at home averaging around 145/85 per readings patient brought in from home. Would recommend starting a very low dose BP medication and see me in 4-8 weeks to see how well she is tolerating and recheck blood pressure and labs.

## 2016-12-26 NOTE — Telephone Encounter (Signed)
Patient advised as below and agrees with treatment plan. Follow up appointment scheduled 02/04/2017 at 9:45am. Patient states she will call 1 week before appointment to request lab slip for blood work.

## 2016-12-26 NOTE — Telephone Encounter (Signed)
OK perfect. Thanks.

## 2017-01-28 ENCOUNTER — Telehealth: Payer: Self-pay | Admitting: Physician Assistant

## 2017-01-28 DIAGNOSIS — I10 Essential (primary) hypertension: Secondary | ICD-10-CM

## 2017-01-28 DIAGNOSIS — E7800 Pure hypercholesterolemia, unspecified: Secondary | ICD-10-CM

## 2017-01-28 NOTE — Telephone Encounter (Signed)
Per Tawanna Sat scheduled pt this week. Patient is scheduled for Thursday at 4:00pm.  Thanks,  -Joseline

## 2017-01-28 NOTE — Telephone Encounter (Signed)
Pt states she has appointment on 02/04/17 and is requesting a lab slip to have labs done.  EQ#[phone]

## 2017-01-28 NOTE — Telephone Encounter (Signed)
Patient also wants Tawanna Sat to know that she is having abdominal discomfort and leg pain and they are getting cold. Per patient she is doing good but didn't know if you can order other additional labs to check why she is having this symptoms.   Thanks,  -Titilayo Hagans

## 2017-01-30 ENCOUNTER — Ambulatory Visit (INDEPENDENT_AMBULATORY_CARE_PROVIDER_SITE_OTHER): Payer: Medicare Other | Admitting: Physician Assistant

## 2017-01-30 ENCOUNTER — Encounter: Payer: Self-pay | Admitting: Physician Assistant

## 2017-01-30 VITALS — BP 142/70 | HR 59 | Temp 98.0°F | Resp 16 | Wt 153.4 lb

## 2017-01-30 DIAGNOSIS — M48061 Spinal stenosis, lumbar region without neurogenic claudication: Secondary | ICD-10-CM | POA: Diagnosis not present

## 2017-01-30 DIAGNOSIS — M5126 Other intervertebral disc displacement, lumbar region: Secondary | ICD-10-CM | POA: Diagnosis not present

## 2017-01-30 DIAGNOSIS — Z800 Family history of malignant neoplasm of digestive organs: Secondary | ICD-10-CM

## 2017-01-30 DIAGNOSIS — I10 Essential (primary) hypertension: Secondary | ICD-10-CM

## 2017-01-30 DIAGNOSIS — R198 Other specified symptoms and signs involving the digestive system and abdomen: Secondary | ICD-10-CM | POA: Diagnosis not present

## 2017-01-30 DIAGNOSIS — M5137 Other intervertebral disc degeneration, lumbosacral region: Secondary | ICD-10-CM | POA: Diagnosis not present

## 2017-01-30 DIAGNOSIS — M5116 Intervertebral disc disorders with radiculopathy, lumbar region: Secondary | ICD-10-CM | POA: Diagnosis not present

## 2017-01-30 DIAGNOSIS — M791 Myalgia: Secondary | ICD-10-CM | POA: Diagnosis not present

## 2017-01-30 DIAGNOSIS — M461 Sacroiliitis, not elsewhere classified: Secondary | ICD-10-CM | POA: Diagnosis not present

## 2017-01-30 DIAGNOSIS — M7061 Trochanteric bursitis, right hip: Secondary | ICD-10-CM | POA: Diagnosis not present

## 2017-01-30 DIAGNOSIS — M47896 Other spondylosis, lumbar region: Secondary | ICD-10-CM | POA: Diagnosis not present

## 2017-01-30 DIAGNOSIS — R197 Diarrhea, unspecified: Secondary | ICD-10-CM

## 2017-01-30 DIAGNOSIS — K5909 Other constipation: Secondary | ICD-10-CM | POA: Diagnosis not present

## 2017-01-30 NOTE — Progress Notes (Signed)
Patient: [name] Female    DOB: 04-Aug-1936   81 y.o.   MRN: [id number] Visit Date: 01/30/2017  Today's Provider: Mar Daring, PA-C   Chief Complaint  Patient presents with  . Leg Pain  . Diarrhea   Subjective:      Patient is here today with c/o leg pain and legs feeling cold and abdominal discomfort. She reports the diarrhea has become more chronic that sometimes she doesn't make it to the restroom.  Leg Pain   There was no injury mechanism. The pain is present in the left leg, left knee, left ankle, left foot, right leg, right knee, right ankle, right foot, left toes and right toes. The quality of the pain is described as aching (Cold from the knee down-off and on). The pain is moderate. The pain has been fluctuating since onset. She reports no foreign bodies present. Nothing aggravates the symptoms. She has tried heat for the symptoms. The treatment provided no relief.  Diarrhea   This is a new problem. The current episode started more than 1 month ago. The problem occurs 2 to 4 times per day (sometimes in the middle of the night). The problem has been gradually worsening. The stool consistency is described as mucous and watery. The patient states that diarrhea awakens her from sleep. Associated symptoms include abdominal pain (cramping), arthralgias, bloating, chills (some) and headaches. Pertinent negatives include no vomiting or weight loss. Associated symptoms comments: She takes OFEV doesn't know if this is what is aggrating her stomach. Exacerbated by: Everything she eats. Risk factors include suspect food intake. Treatments tried: She has given up on chips and sweet. The treatment provided no relief.    Hypertension, follow-up:  BP Readings from Last 3 Encounters:  01/30/17 (!) 142/70  12/12/16 (!) 165/82  09/12/16 124/65    She was last seen for hypertension 4 weeks ago.  BP at that visit was 165/82. Management since that visit includes to check BP  at home for a week and if still borderline high we will add ACE/ARB to treat. Patient was prescribed Lisinopril 5 mg. She reports excellent compliance with treatment. She is not having side effects.  She is exercising. She is adherent to low salt diet.   Outside blood pressures are 145/85. She is experiencing fatigue and dzzy when she stands up sometimes.  Patient denies chest pain, chest pressure/discomfort, dyspnea, exertional chest pressure/discomfort, irregular heart beat, lower extremity edema, near-syncope and palpitations.   Cardiovascular risk factors include advanced age (older than 59 for men, 67 for women), dyslipidemia and hypertension.     Weight trend: stable Wt Readings from Last 3 Encounters:  01/30/17 153 lb 6.4 oz (69.6 kg)  12/12/16 155 lb 6.4 oz (70.5 kg)  12/12/16 153 lb 4.8 oz (69.5 kg)    Current diet: in general, a "healthy" diet   ------------------------------------------------------------------------     No Known Allergies   Current Outpatient Prescriptions:  .  aspirin 81 MG chewable tablet, Chew by mouth., Disp: , Rfl:  .  betamethasone valerate (VALISONE) 0.1 % cream, , Disp: , Rfl:  .  citalopram (CELEXA) 20 MG tablet, Take 1 tablet (20 mg total) by mouth daily., Disp: 90 tablet, Rfl: 3 .  HYDROmorphone (DILAUDID) 2 MG tablet, Take 2 mg by mouth 2 (two) times daily. , Disp: , Rfl:  .  lisinopril (PRINIVIL,ZESTRIL) 5 MG tablet, Take 1 tablet (5 mg total) by mouth daily., Disp: 30 tablet, Rfl: 1 .  Nintedanib (OFEV) 100 MG CAPS, 2 (two) times daily. , Disp: , Rfl:  .  nystatin (MYCOSTATIN) powder, Apply topically 2 (two) times daily., Disp: 30 g, Rfl: 5 .  OXYQUINOLONE SULFATE VAGINAL (TRIMO-SAN) 0.025 % GEL, Place 1 Applicatorful vaginally every 14 (fourteen) days., Disp: 45 g, Rfl: 1 .  promethazine (PHENERGAN) 12.5 MG tablet, TAKE ONE TABLET BY MOUTH EVERY 6 HOURS AS NEEDED, Disp: , Rfl:  .  triamcinolone ointment (KENALOG) 0.5 %, Apply 1  application topically 2 (two) times daily., Disp: 30 g, Rfl: 1 .  zoledronic acid (RECLAST) 5 MG/100ML SOLN injection, Inject into the vein., Disp: , Rfl:  .  Calcium Citrate-Vitamin D (CALCIUM CITRATE + D3 MAXIMUM) 315-250 MG-UNIT TABS, Take 1 tablet by mouth daily. (Patient not taking: Reported on 01/30/2017), Disp: 90 tablet, Rfl: 0 .  Docusate Sodium 100 MG capsule, Take 1 tablet (100 mg total) by mouth 2 (two) times daily. (Patient not taking: Reported on 12/12/2016), Disp: 10 tablet, Rfl: 0  Review of Systems  Constitutional: Positive for chills (some). Negative for weight loss.  Cardiovascular: Negative for chest pain, palpitations and leg swelling.  Gastrointestinal: Positive for abdominal pain (cramping), bloating and diarrhea. Negative for vomiting.  Musculoskeletal: Positive for arthralgias.       Leg pain  Neurological: Positive for headaches.    Social History  Substance Use Topics  . Smoking status: Never Smoker  . Smokeless tobacco: Never Used  . Alcohol use No   Objective:   BP (!) 142/70 (BP Location: Right Arm, Patient Position: Sitting, Cuff Size: Normal)   Pulse (!) 59   Temp 98 F (36.7 C) (Oral)   Resp 16   Wt 153 lb 6.4 oz (69.6 kg)   BMI 29.96 kg/m     Physical Exam  Constitutional: She appears well-developed and well-nourished. No distress.  HENT:  Head: Normocephalic and atraumatic.  Right Ear: Hearing, tympanic membrane, external ear and ear canal normal.  Left Ear: Hearing, tympanic membrane, external ear and ear canal normal.  Nose: Nose normal.  Mouth/Throat: Uvula is midline, oropharynx is clear and moist and mucous membranes are normal. No oropharyngeal exudate.  Eyes: Conjunctivae are normal. Pupils are equal, round, and reactive to light. Right eye exhibits no discharge. Left eye exhibits no discharge. No scleral icterus.  Neck: Normal range of motion. Neck supple. No tracheal deviation present. No thyromegaly present.  Cardiovascular: Normal  rate, regular rhythm and normal heart sounds.  Exam reveals no gallop and no friction rub.   No murmur heard. Pulmonary/Chest: Effort normal and breath sounds normal. No stridor. No respiratory distress. She has no wheezes. She has no rales.  Abdominal: There is no hepatosplenomegaly. There is no tenderness. There is no rebound, no guarding and no CVA tenderness.  Lymphadenopathy:    She has no cervical adenopathy.  Skin: Skin is warm and dry. She is not diaphoretic.  Vitals reviewed.      Assessment & Plan:     1. Diarrhea, unspecified type Discussed checking labs as below and f/u pending results. Patient is already established with Dawson Bills, NP, Gastroenterology, and she is going to call their office if symptoms persist. We also discussed obtaining stool cultures but patient wants to check labs first then follow up with stool testing if symptoms do not improve. She was given information on a bland diet and advised to use immodium prn. She agrees. She would be a high risk candidate for colonoscopy due to her pulmonary fibrosis.  -  CEA - Cancer Antigen 19-9 -CBC with differential  2. Change in bowel function See above medical treatment plan. - CEA - Cancer Antigen 19-9  3. Family history of colon cancer See above medical treatment plan. - CEA - Cancer Antigen 19-9  4. Essential hypertension Improving. Continue lisinopril 5 mg.        Mar Daring, PA-C  [facility]

## 2017-01-30 NOTE — Patient Instructions (Signed)
Bland Diet A bland diet consists of foods that do not have a lot of fat or fiber. Foods without fat or fiber are easier for the body to digest. They are also less likely to irritate your mouth, throat, stomach, and other parts of your gastrointestinal tract. A bland diet is sometimes called a BRAT diet. What is my plan? Your health care provider or dietitian may recommend specific changes to your diet to prevent and treat your symptoms, such as:  Eating small meals often.  Cooking food until it is soft enough to chew easily.  Chewing your food well.  Drinking fluids slowly.  Not eating foods that are very spicy, sour, or fatty.  Not eating citrus fruits, such as oranges and grapefruit.  What do I need to know about this diet?  Eat a variety of foods from the bland diet food list.  Do not follow a bland diet longer than you have to.  Ask your health care provider whether you should take vitamins. What foods can I eat? Grains  Hot cereals, such as cream of wheat. Bread, crackers, or tortillas made from refined white flour. Rice. Vegetables Canned or cooked vegetables. Mashed or boiled potatoes. Fruits Bananas. Applesauce. Other types of cooked or canned fruit with the skin and seeds removed, such as canned peaches or pears. Meats and Other Protein Sources Scrambled eggs. Creamy peanut butter or other nut butters. Lean, well-cooked meats, such as chicken or fish. Tofu. Soups or broths. Dairy Low-fat dairy products, such as milk, cottage cheese, or yogurt. Beverages Water. Herbal tea. Apple juice. Sweets and Desserts Pudding. Custard. Fruit gelatin. Ice cream. Fats and Oils Mild salad dressings. Canola or olive oil. The items listed above may not be a complete list of allowed foods or beverages. Contact your dietitian for more options. What foods are not recommended? Foods and ingredients that are often not recommended include:  Spicy foods, such as hot sauce or  salsa.  Fried foods.  Sour foods, such as pickled or fermented foods.  Raw vegetables or fruits, especially citrus or berries.  Caffeinated drinks.  Alcohol.  Strongly flavored seasonings or condiments.  The items listed above may not be a complete list of foods and beverages that are not allowed. Contact your dietitian for more information. This information is not intended to replace advice given to you by your health care provider. Make sure you discuss any questions you have with your health care provider. Document Released: 12/11/2015 Document Revised: 01/25/2016 Document Reviewed: 08/31/2014 Elsevier Interactive Patient Education  2018 [employer].

## 2017-02-03 DIAGNOSIS — I10 Essential (primary) hypertension: Secondary | ICD-10-CM | POA: Diagnosis not present

## 2017-02-03 DIAGNOSIS — E7800 Pure hypercholesterolemia, unspecified: Secondary | ICD-10-CM | POA: Diagnosis not present

## 2017-02-03 DIAGNOSIS — R197 Diarrhea, unspecified: Secondary | ICD-10-CM | POA: Diagnosis not present

## 2017-02-03 DIAGNOSIS — Z800 Family history of malignant neoplasm of digestive organs: Secondary | ICD-10-CM | POA: Diagnosis not present

## 2017-02-03 DIAGNOSIS — R198 Other specified symptoms and signs involving the digestive system and abdomen: Secondary | ICD-10-CM | POA: Diagnosis not present

## 2017-02-04 ENCOUNTER — Ambulatory Visit: Payer: Self-pay | Admitting: Physician Assistant

## 2017-02-04 LAB — COMPREHENSIVE METABOLIC PANEL
ALT: 36 IU/L — ABNORMAL HIGH (ref 0–32)
AST: 35 IU/L (ref 0–40)
Albumin/Globulin Ratio: 1.6 (ref 1.2–2.2)
Albumin: 4 g/dL (ref 3.5–4.8)
Alkaline Phosphatase: 94 IU/L (ref 39–117)
BUN/Creatinine Ratio: 23 (ref 12–28)
BUN: 16 mg/dL (ref 8–27)
Bilirubin Total: 1 mg/dL (ref 0.0–1.2)
CO2: 25 mmol/L (ref 18–29)
Calcium: 9.4 mg/dL (ref 8.7–10.3)
Chloride: 106 mmol/L (ref 96–106)
Creatinine, Ser: 0.7 mg/dL (ref 0.57–1.00)
GFR calc Af Amer: 96 mL/min/{1.73_m2} (ref >59)
GFR calc non Af Amer: 83 mL/min/{1.73_m2} (ref >59)
Globulin, Total: 2.5 g/dL (ref 1.5–4.5)
Glucose: 87 mg/dL (ref 65–99)
Potassium: 4.3 mmol/L (ref 3.5–5.2)
Sodium: 148 mmol/L — ABNORMAL HIGH (ref 134–144)
Total Protein: 6.5 g/dL (ref 6.0–8.5)

## 2017-02-04 LAB — CANCER ANTIGEN 19-9: CA 19-9: 13 U/mL (ref 0–35)

## 2017-02-04 LAB — LIPID PANEL
Chol/HDL Ratio: 2.9 ratio (ref 0.0–4.4)
Cholesterol, Total: 199 mg/dL (ref 100–199)
HDL: 69 mg/dL (ref >39)
LDL Calculated: 103 mg/dL — ABNORMAL HIGH (ref 0–99)
Triglycerides: 137 mg/dL (ref 0–149)
VLDL Cholesterol Cal: 27 mg/dL (ref 5–40)

## 2017-02-04 LAB — CBC WITH DIFFERENTIAL/PLATELET
Basophils Absolute: 0.1 10*3/uL (ref 0.0–0.2)
Basos: 1 %
EOS (ABSOLUTE): 0.1 10*3/uL (ref 0.0–0.4)
Eos: 2 %
Hematocrit: 41.4 % (ref 34.0–46.6)
Hemoglobin: 14.1 g/dL (ref 11.1–15.9)
Immature Grans (Abs): 0 10*3/uL (ref 0.0–0.1)
Immature Granulocytes: 0 %
Lymphocytes Absolute: 2 10*3/uL (ref 0.7–3.1)
Lymphs: 28 %
MCH: 31.2 pg (ref 26.6–33.0)
MCHC: 34.1 g/dL (ref 31.5–35.7)
MCV: 92 fL (ref 79–97)
Monocytes Absolute: 0.8 10*3/uL (ref 0.1–0.9)
Monocytes: 11 %
Neutrophils Absolute: 4 10*3/uL (ref 1.4–7.0)
Neutrophils: 58 %
Platelets: 245 10*3/uL (ref 150–379)
RBC: 4.52 x10E6/uL (ref 3.77–5.28)
RDW: 14 % (ref 12.3–15.4)
WBC: 6.9 10*3/uL (ref 3.4–10.8)

## 2017-02-04 LAB — TSH: TSH: 2.21 u[IU]/mL (ref 0.450–4.500)

## 2017-02-04 LAB — CEA: CEA: 3.4 ng/mL (ref 0.0–4.7)

## 2017-02-05 ENCOUNTER — Telehealth: Payer: Self-pay

## 2017-02-05 NOTE — Telephone Encounter (Signed)
Patient advised as directed below. Per patient she still has the abdominal pain and is not feeling well. She reports she is scheduled to see GI and is asking if she should keep the appointment? She is also concern about the cancer test results, if there is any else that she needs to do or what? Because she feels something is wrong with her. She also reports that she doesn't use that "much salt".  Please advise.  Thanks,  -Nuri Larmer

## 2017-02-05 NOTE — Telephone Encounter (Signed)
Agree with keeping GI appt for further evaluation.   You have to watch for hidden salt. Many processed and prepackaged foods have high amounts of sodium.

## 2017-02-05 NOTE — Telephone Encounter (Signed)
-----   Message from Mar Daring, [place] sent at 02/04/2017 11:07 AM EDT ----- All labs are within normal limits and stable with exception of sodium that is borderline high. Try to limit salt intake.  Thanks! -JB

## 2017-02-05 NOTE — Telephone Encounter (Signed)
-----   Message from Mar Daring, [place] sent at 02/04/2017 11:09 AM EDT ----- Cancer markers for colon cancer are within normal range. This is not 100% at saying you do not have colon cancer but can be used to help to confirm that you do not have colon cancer.

## 2017-02-05 NOTE — Telephone Encounter (Signed)
Patient advised as directed below. Patient is scheduled with GI on 02/11/17.  Thanks,  -Shad Ledvina

## 2017-02-06 DIAGNOSIS — M461 Sacroiliitis, not elsewhere classified: Secondary | ICD-10-CM | POA: Diagnosis not present

## 2017-02-06 DIAGNOSIS — M47817 Spondylosis without myelopathy or radiculopathy, lumbosacral region: Secondary | ICD-10-CM | POA: Diagnosis not present

## 2017-02-11 DIAGNOSIS — R197 Diarrhea, unspecified: Secondary | ICD-10-CM | POA: Diagnosis not present

## 2017-02-12 DIAGNOSIS — R0602 Shortness of breath: Secondary | ICD-10-CM | POA: Diagnosis not present

## 2017-02-12 DIAGNOSIS — G4733 Obstructive sleep apnea (adult) (pediatric): Secondary | ICD-10-CM | POA: Diagnosis not present

## 2017-02-12 DIAGNOSIS — J8410 Pulmonary fibrosis, unspecified: Secondary | ICD-10-CM | POA: Diagnosis not present

## 2017-02-20 ENCOUNTER — Ambulatory Visit (INDEPENDENT_AMBULATORY_CARE_PROVIDER_SITE_OTHER): Payer: Medicare Other | Admitting: Obstetrics and Gynecology

## 2017-02-20 ENCOUNTER — Encounter: Payer: Self-pay | Admitting: Obstetrics and Gynecology

## 2017-02-20 VITALS — BP 130/72 | HR 57 | Ht 60.0 in | Wt 152.0 lb

## 2017-02-20 DIAGNOSIS — Z960 Presence of urogenital implants: Secondary | ICD-10-CM

## 2017-02-20 DIAGNOSIS — N8110 Cystocele, unspecified: Secondary | ICD-10-CM

## 2017-02-20 DIAGNOSIS — N952 Postmenopausal atrophic vaginitis: Secondary | ICD-10-CM | POA: Diagnosis not present

## 2017-02-20 DIAGNOSIS — N898 Other specified noninflammatory disorders of vagina: Secondary | ICD-10-CM | POA: Diagnosis not present

## 2017-02-20 DIAGNOSIS — Z9289 Personal history of other medical treatment: Secondary | ICD-10-CM | POA: Diagnosis not present

## 2017-02-20 DIAGNOSIS — N939 Abnormal uterine and vaginal bleeding, unspecified: Secondary | ICD-10-CM | POA: Diagnosis not present

## 2017-02-20 NOTE — Progress Notes (Signed)
    GYNECOLOGY PROGRESS NOTE  Subjective:    Patient ID: [name], female    DOB: 01-28-1936, 81 y.o.   MRN: [id number]  HPI  Patient is a 81 y.o. G2P2 female who presents for pessary check for cystocele. She reports no vaginal bleeding. She denies pelvic discomfort.  Denies difficulty urinating.  Currently denies difficulty passing urine or stools. Notes increased vaginal discharge, dark-yellowish, sometimes slight odor. Also noting some occasional vaginal spotting and urinary dribbling.    The following portions of the patient's history were reviewed and updated as appropriate: allergies, current medications, past family history, past medical history, past social history, past surgical history and problem list.  Review of Systems Pertinent items noted in HPI and remainder of comprehensive ROS otherwise negative.   Objective:   Blood pressure 130/72, pulse (!) 57, height 5' (1.524 m), weight 152 lb (68.9 kg).    General appearance: alert and no distress  Abdomen: soft, non-tender. No masses palpable or organomegaly.  Pelvis: Vulva normal appearing, no lesions. Urethra with moderate to severe atrophy with certain areas of urethra purple.  Vagina atrophic. The patient's Size 3 ring with support pessary was removed, and cleaned. Speculum examination revealed normal vaginal mucosa with no lesions.  There was a small vaginal laceration on left vaginal sidewall, with small amount of blood in vaginal vault.  Extremities: extremities normal, atraumatic, no cyanosis or edema and varicose veins noted Neurologic: grossly intact.   Assessment:   Pessary in situ Cystocele Leukorrhea PMB (likely due to vaginal bleeding from vaginal wall laceration) Vaginal atrophy  Plan:   - Continue using Trimo-san gel as previously prescribed (once every other week as needed). Nuswab performed today.  Leukorrhea likely physiologic, but patient notes it has a slight odor. Will treat if necessary  -  Will leave pessary out for 2 weeks to allow for healing.  In the meantime, will give Premarin samples to help with healing and treat vaginal atrophy. To use nightly x 2 weeks. The patient should return in 2 weeks for pessary reinsertion and re-evaluation of vaginal bleeding.    Rubie Maid, MD [facility]    Rubie Maid, MD [facility]

## 2017-02-21 ENCOUNTER — Other Ambulatory Visit: Payer: Self-pay | Admitting: Physician Assistant

## 2017-02-21 DIAGNOSIS — I10 Essential (primary) hypertension: Secondary | ICD-10-CM

## 2017-02-21 MED ORDER — LISINOPRIL 5 MG PO TABS: 5.0000 mg | ORAL_TABLET | Freq: Every day | ORAL | 1 refills | Status: DC

## 2017-02-21 NOTE — Telephone Encounter (Signed)
[facility] faxed a request for a 90-days supply for the following medication.  Thanks CC   lisinopril (PRINIVIL,ZESTRIL) 5 MG tablet

## 2017-02-24 LAB — NUSWAB BV AND CANDIDA, NAA
Candida albicans, NAA: POSITIVE — AB
Candida glabrata, NAA: NEGATIVE

## 2017-02-25 ENCOUNTER — Telehealth: Payer: Self-pay

## 2017-02-25 DIAGNOSIS — B373 Candidiasis of vulva and vagina: Secondary | ICD-10-CM

## 2017-02-25 DIAGNOSIS — B3731 Acute candidiasis of vulva and vagina: Secondary | ICD-10-CM

## 2017-02-25 MED ORDER — FLUCONAZOLE 150 MG PO TABS: 150.0000 mg | ORAL_TABLET | Freq: Once | ORAL | 0 refills | Status: AC

## 2017-02-25 NOTE — Telephone Encounter (Signed)
-----   Message from Rubie Maid, MD sent at 02/25/2017  8:24 AM EDT ----- Please inform patient of positive Nuswab (yeast).  Can treat with Diflucan if no contraindications, or Terazole cream.

## 2017-02-25 NOTE — Telephone Encounter (Signed)
Called pt informed her of vaginal yeast infection, pt gave verbal understanding. RX sent in.

## 2017-03-06 ENCOUNTER — Ambulatory Visit (INDEPENDENT_AMBULATORY_CARE_PROVIDER_SITE_OTHER): Payer: Medicare Other | Admitting: Obstetrics and Gynecology

## 2017-03-06 ENCOUNTER — Encounter: Payer: Self-pay | Admitting: Obstetrics and Gynecology

## 2017-03-06 VITALS — BP 119/61 | HR 53 | Ht 60.0 in | Wt 152.1 lb

## 2017-03-06 DIAGNOSIS — N8110 Cystocele, unspecified: Secondary | ICD-10-CM | POA: Diagnosis not present

## 2017-03-06 DIAGNOSIS — Z4689 Encounter for fitting and adjustment of other specified devices: Secondary | ICD-10-CM

## 2017-03-06 DIAGNOSIS — N952 Postmenopausal atrophic vaginitis: Secondary | ICD-10-CM

## 2017-03-06 MED ORDER — ESTRADIOL 0.1 MG/GM VA CREA: 1.0000 g | TOPICAL_CREAM | VAGINAL | 2 refills | Status: DC

## 2017-03-06 NOTE — Progress Notes (Signed)
    GYNECOLOGY PROGRESS NOTE  Subjective:    Patient ID: [name], female    DOB: 1936-03-27, 81 y.o.   MRN: [id number]  HPI  Patient is a 81 y.o. G2P2 female who presents for pessary reinsertion. Pessary was left out x 2 weeks secondary to vaginal laceration.  She reports no further vaginal bleeding.Notes that she has been using the Premarin cream samples as recommended. Patient does report that she has noticed that her back pain has worsened some since the pessary has been out.    The following portions of the patient's history were reviewed and updated as appropriate: allergies, current medications, past family history, past medical history, past social history, past surgical history and problem list.  Review of Systems Pertinent items noted in HPI and remainder of comprehensive ROS otherwise negative.   Objective:   Blood pressure 119/61, pulse (!) 53, height 5' (1.524 m), weight 152 lb 1.6 oz (69 kg).    General appearance: alert and no distress  Abdomen: soft, non-tender. No masses palpable or organomegaly.  Pelvis: Vulva normal appearing, no lesions. Urethra and vagina atrophic.  Speculum examination revealed normal vaginal mucosa with no lesions, laceration has healed.    Assessment:   Pessary in situ Cystocele Vaginal laceration resolved Vaginal atrophy  Plan:   - Change to using Trimo-san gel monthly.   - Given another sample of Premarin cream.  Will prescribe estrogen cream for continued use 1-2 times weekly to help prevent further lacerations due to vaginal atrophy with pessary in placed.  - To f/u in 8-10 weeks for pessary check.    Rubie Maid, MD [facility]    Rubie Maid, MD [facility]

## 2017-03-07 ENCOUNTER — Telehealth: Payer: Self-pay | Admitting: Obstetrics and Gynecology

## 2017-03-07 DIAGNOSIS — N952 Postmenopausal atrophic vaginitis: Secondary | ICD-10-CM

## 2017-03-07 MED ORDER — ESTRADIOL 0.1 MG/GM VA CREA: 1.0000 g | TOPICAL_CREAM | VAGINAL | 2 refills | Status: DC

## 2017-03-07 NOTE — Telephone Encounter (Signed)
RX sent in again.

## 2017-03-07 NOTE — Telephone Encounter (Signed)
[facility] hasn't received the order for estrace from yesterday  Will you please resend it

## 2017-03-11 ENCOUNTER — Other Ambulatory Visit: Payer: Self-pay

## 2017-03-11 DIAGNOSIS — N952 Postmenopausal atrophic vaginitis: Secondary | ICD-10-CM

## 2017-03-11 MED ORDER — ESTRADIOL 0.1 MG/GM VA CREA
1.0000 | TOPICAL_CREAM | VAGINAL | 2 refills | Status: DC
Start: 2017-03-11 — End: 2018-06-16

## 2017-03-17 DIAGNOSIS — M791 Myalgia: Secondary | ICD-10-CM | POA: Diagnosis not present

## 2017-03-17 DIAGNOSIS — M7061 Trochanteric bursitis, right hip: Secondary | ICD-10-CM | POA: Diagnosis not present

## 2017-03-17 DIAGNOSIS — K5909 Other constipation: Secondary | ICD-10-CM | POA: Diagnosis not present

## 2017-03-17 DIAGNOSIS — M5116 Intervertebral disc disorders with radiculopathy, lumbar region: Secondary | ICD-10-CM | POA: Diagnosis not present

## 2017-03-17 DIAGNOSIS — M5126 Other intervertebral disc displacement, lumbar region: Secondary | ICD-10-CM | POA: Diagnosis not present

## 2017-03-17 DIAGNOSIS — M5137 Other intervertebral disc degeneration, lumbosacral region: Secondary | ICD-10-CM | POA: Diagnosis not present

## 2017-03-17 DIAGNOSIS — Z79891 Long term (current) use of opiate analgesic: Secondary | ICD-10-CM | POA: Diagnosis not present

## 2017-03-17 DIAGNOSIS — M461 Sacroiliitis, not elsewhere classified: Secondary | ICD-10-CM | POA: Diagnosis not present

## 2017-03-17 DIAGNOSIS — M545 Low back pain: Secondary | ICD-10-CM | POA: Diagnosis not present

## 2017-03-17 DIAGNOSIS — M48061 Spinal stenosis, lumbar region without neurogenic claudication: Secondary | ICD-10-CM | POA: Diagnosis not present

## 2017-03-17 DIAGNOSIS — M47896 Other spondylosis, lumbar region: Secondary | ICD-10-CM | POA: Diagnosis not present

## 2017-03-17 DIAGNOSIS — G894 Chronic pain syndrome: Secondary | ICD-10-CM | POA: Diagnosis not present

## 2017-03-21 DIAGNOSIS — M545 Low back pain: Secondary | ICD-10-CM | POA: Diagnosis not present

## 2017-03-26 DIAGNOSIS — M545 Low back pain: Secondary | ICD-10-CM | POA: Diagnosis not present

## 2017-03-26 DIAGNOSIS — M5116 Intervertebral disc disorders with radiculopathy, lumbar region: Secondary | ICD-10-CM | POA: Diagnosis not present

## 2017-03-26 DIAGNOSIS — M7061 Trochanteric bursitis, right hip: Secondary | ICD-10-CM | POA: Diagnosis not present

## 2017-03-26 DIAGNOSIS — M5126 Other intervertebral disc displacement, lumbar region: Secondary | ICD-10-CM | POA: Diagnosis not present

## 2017-03-26 DIAGNOSIS — M47896 Other spondylosis, lumbar region: Secondary | ICD-10-CM | POA: Diagnosis not present

## 2017-03-26 DIAGNOSIS — M791 Myalgia: Secondary | ICD-10-CM | POA: Diagnosis not present

## 2017-03-26 DIAGNOSIS — M461 Sacroiliitis, not elsewhere classified: Secondary | ICD-10-CM | POA: Diagnosis not present

## 2017-03-26 DIAGNOSIS — K5909 Other constipation: Secondary | ICD-10-CM | POA: Diagnosis not present

## 2017-03-26 DIAGNOSIS — M48061 Spinal stenosis, lumbar region without neurogenic claudication: Secondary | ICD-10-CM | POA: Diagnosis not present

## 2017-03-26 DIAGNOSIS — M5137 Other intervertebral disc degeneration, lumbosacral region: Secondary | ICD-10-CM | POA: Diagnosis not present

## 2017-04-03 ENCOUNTER — Ambulatory Visit: Payer: Medicare Other | Admitting: Podiatry

## 2017-04-03 DIAGNOSIS — M48061 Spinal stenosis, lumbar region without neurogenic claudication: Secondary | ICD-10-CM | POA: Diagnosis not present

## 2017-04-03 DIAGNOSIS — Z6830 Body mass index (BMI) 30.0-30.9, adult: Secondary | ICD-10-CM | POA: Diagnosis not present

## 2017-04-03 DIAGNOSIS — E6609 Other obesity due to excess calories: Secondary | ICD-10-CM | POA: Diagnosis not present

## 2017-04-07 DIAGNOSIS — D692 Other nonthrombocytopenic purpura: Secondary | ICD-10-CM | POA: Diagnosis not present

## 2017-04-07 DIAGNOSIS — L820 Inflamed seborrheic keratosis: Secondary | ICD-10-CM | POA: Diagnosis not present

## 2017-04-07 DIAGNOSIS — Z85828 Personal history of other malignant neoplasm of skin: Secondary | ICD-10-CM | POA: Diagnosis not present

## 2017-04-16 ENCOUNTER — Ambulatory Visit (INDEPENDENT_AMBULATORY_CARE_PROVIDER_SITE_OTHER): Payer: Medicare Other

## 2017-04-16 VITALS — BP 120/78 | HR 60 | Temp 98.0°F | Ht 60.0 in | Wt 156.0 lb

## 2017-04-16 DIAGNOSIS — Z0000 Encounter for general adult medical examination without abnormal findings: Secondary | ICD-10-CM | POA: Diagnosis not present

## 2017-04-16 NOTE — Patient Instructions (Signed)
[name] , Thank you for taking time to come for your Medicare Wellness Visit. I appreciate your ongoing commitment to your health goals. Please review the following plan we discussed and let me know if I can assist you in the future.   Screening recommendations/referrals: Colonoscopy: completed  Mammogram: completed Bone Density: completed Recommended yearly ophthalmology/optometry visit for glaucoma screening and checkup Recommended yearly dental visit for hygiene and checkup  Vaccinations: Influenza vaccine: due fall 2018 Pneumococcal vaccine: completed series Tdap vaccine: completed 05/25/11, due 05/2021 Shingles vaccine: completed  Advanced directives: Advance directive discussed with you today. I have provided a copy for you to complete at home and have notarized. Once this is complete please bring a copy in to our office so we can scan it into your chart.  Conditions/risks identified: Obesity; Recommend reducing fat intake in daily food. Pt to cut out chips and ice cream in diet.  Next appointment: 04/28/17 @ 9:00 AM   Preventive Care 65 Years and Older, Female Preventive care refers to lifestyle choices and visits with your health care provider that can promote health and wellness. What does preventive care include?  A yearly physical exam. This is also called an annual well check.  Dental exams once or twice a year.  Routine eye exams. Ask your health care provider how often you should have your eyes checked.  Personal lifestyle choices, including:  Daily care of your teeth and gums.  Regular physical activity.  Eating a healthy diet.  Avoiding tobacco and drug use.  Limiting alcohol use.  Practicing safe sex.  Taking low-dose aspirin every day.  Taking vitamin and mineral supplements as recommended by your health care provider. What happens during an annual well check? The services and screenings done by your health care provider during your annual  well check will depend on your age, overall health, lifestyle risk factors, and family history of disease. Counseling  Your health care provider may ask you questions about your:  Alcohol use.  Tobacco use.  Drug use.  Emotional well-being.  Home and relationship well-being.  Sexual activity.  Eating habits.  History of falls.  Memory and ability to understand (cognition).  Work and work Statistician.  Reproductive health. Screening  You may have the following tests or measurements:  Height, weight, and BMI.  Blood pressure.  Lipid and cholesterol levels. These may be checked every 5 years, or more frequently if you are over 89 years old.  Skin check.  Lung cancer screening. You may have this screening every year starting at age 17 if you have a 30-pack-year history of smoking and currently smoke or have quit within the past 15 years.  Fecal occult blood test (FOBT) of the stool. You may have this test every year starting at age 43.  Flexible sigmoidoscopy or colonoscopy. You may have a sigmoidoscopy every 5 years or a colonoscopy every 10 years starting at age 68.  Hepatitis C blood test.  Hepatitis B blood test.  Sexually transmitted disease (STD) testing.  Diabetes screening. This is done by checking your blood sugar (glucose) after you have not eaten for a while (fasting). You may have this done every 1-3 years.  Bone density scan. This is done to screen for osteoporosis. You may have this done starting at age 18.  Mammogram. This may be done every 1-2 years. Talk to your health care provider about how often you should have regular mammograms. Talk with your health care provider about your test results, treatment  options, and if necessary, the need for more tests. Vaccines  Your health care provider may recommend certain vaccines, such as:  Influenza vaccine. This is recommended every year.  Tetanus, diphtheria, and acellular pertussis (Tdap, Td) vaccine.  You may need a Td booster every 10 years.  Zoster vaccine. You may need this after age 57.  Pneumococcal 13-valent conjugate (PCV13) vaccine. One dose is recommended after age 17.  Pneumococcal polysaccharide (PPSV23) vaccine. One dose is recommended after age 47. Talk to your health care provider about which screenings and vaccines you need and how often you need them. This information is not intended to replace advice given to you by your health care provider. Make sure you discuss any questions you have with your health care provider. Document Released: 09/15/2015 Document Revised: 05/08/2016 Document Reviewed: 06/20/2015 Elsevier Interactive Patient Education  2017 [place] Prevention in the Home Falls can cause injuries. They can happen to people of all ages. There are many things you can do to make your home safe and to help prevent falls. What can I do on the outside of my home?  Regularly fix the edges of walkways and driveways and fix any cracks.  Remove anything that might make you trip as you walk through a door, such as a raised step or threshold.  Trim any bushes or trees on the path to your home.  Use bright outdoor lighting.  Clear any walking paths of anything that might make someone trip, such as rocks or tools.  Regularly check to see if handrails are loose or broken. Make sure that both sides of any steps have handrails.  Any raised decks and porches should have guardrails on the edges.  Have any leaves, snow, or ice cleared regularly.  Use sand or salt on walking paths during winter.  Clean up any spills in your garage right away. This includes oil or grease spills. What can I do in the bathroom?  Use night lights.  Install grab bars by the toilet and in the tub and shower. Do not use towel bars as grab bars.  Use non-skid mats or decals in the tub or shower.  If you need to sit down in the shower, use a plastic, non-slip stool.  Keep the  floor dry. Clean up any water that spills on the floor as soon as it happens.  Remove soap buildup in the tub or shower regularly.  Attach bath mats securely with double-sided non-slip rug tape.  Do not have throw rugs and other things on the floor that can make you trip. What can I do in the bedroom?  Use night lights.  Make sure that you have a light by your bed that is easy to reach.  Do not use any sheets or blankets that are too big for your bed. They should not hang down onto the floor.  Have a firm chair that has side arms. You can use this for support while you get dressed.  Do not have throw rugs and other things on the floor that can make you trip. What can I do in the kitchen?  Clean up any spills right away.  Avoid walking on wet floors.  Keep items that you use a lot in easy-to-reach places.  If you need to reach something above you, use a strong step stool that has a grab bar.  Keep electrical cords out of the way.  Do not use floor polish or wax that makes floors slippery.  If you must use wax, use non-skid floor wax.  Do not have throw rugs and other things on the floor that can make you trip. What can I do with my stairs?  Do not leave any items on the stairs.  Make sure that there are handrails on both sides of the stairs and use them. Fix handrails that are broken or loose. Make sure that handrails are as long as the stairways.  Check any carpeting to make sure that it is firmly attached to the stairs. Fix any carpet that is loose or worn.  Avoid having throw rugs at the top or bottom of the stairs. If you do have throw rugs, attach them to the floor with carpet tape.  Make sure that you have a light switch at the top of the stairs and the bottom of the stairs. If you do not have them, ask someone to add them for you. What else can I do to help prevent falls?  Wear shoes that:  Do not have high heels.  Have rubber bottoms.  Are comfortable and fit  you well.  Are closed at the toe. Do not wear sandals.  If you use a stepladder:  Make sure that it is fully opened. Do not climb a closed stepladder.  Make sure that both sides of the stepladder are locked into place.  Ask someone to hold it for you, if possible.  Clearly mark and make sure that you can see:  Any grab bars or handrails.  First and last steps.  Where the edge of each step is.  Use tools that help you move around (mobility aids) if they are needed. These include:  Canes.  Walkers.  Scooters.  Crutches.  Turn on the lights when you go into a dark area. Replace any light bulbs as soon as they burn out.  Set up your furniture so you have a clear path. Avoid moving your furniture around.  If any of your floors are uneven, fix them.  If there are any pets around you, be aware of where they are.  Review your medicines with your doctor. Some medicines can make you feel dizzy. This can increase your chance of falling. Ask your doctor what other things that you can do to help prevent falls. This information is not intended to replace advice given to you by your health care provider. Make sure you discuss any questions you have with your health care provider. Document Released: 06/15/2009 Document Revised: 01/25/2016 Document Reviewed: 09/23/2014 Elsevier Interactive Patient Education  2017 [employer].

## 2017-04-16 NOTE — Progress Notes (Signed)
Subjective:   [name] is a 81 y.o. female who presents for Medicare Annual (Subsequent) preventive examination.  Review of Systems:  N/A  Cardiac Risk Factors include: advanced age (>2mn, >>56women);dyslipidemia;hypertension;obesity (BMI >30kg/m2)     Objective:     Vitals: BP 120/78 (BP Location: Left Arm)   Pulse 60   Temp 98 F (36.7 C) (Oral)   Ht 5' (1.524 m)   Wt 156 lb (70.8 kg)   BMI 30.47 kg/m   Body mass index is 30.47 kg/m.   Tobacco History  Smoking Status  . Never Smoker  Smokeless Tobacco  . Never Used     Counseling given: Not Answered   Past Medical History:  Diagnosis Date  . Altered mental state   . Anxiety   . BMI 30.0-30.9,adult   . Cellulitis   . Chest pain, atypical   . Comp[place] of L4 lumbar vertebra ([place]   . Constipation   . Cystitis   . Cystocele   . Diverticulosis   . Fatigue   . Fibrosis, idiopathic pulmonary ([place]   . Gastroenteritis   . History of back surgery   . History of herniated intervertebral disc   . HTN (hypertension)   . Hypercholesteremia   . Hypocalcemia   . Incontinence of urine   . OA (osteoarthritis)    shoulder and back  . Obesity   . Osteopenia   . Pneumonia   . Shortness of breath   . Sleep apnea   . Thrush   . Vitamin D deficiency    Past Surgical History:  Procedure Laterality Date  . CATARACT EXTRACTION W/PHACO Left 04/24/2016   Procedure: CATARACT EXTRACTION PHACO AND INTRAOCULAR LENS PLACEMENT (IOC);  Surgeon: CLeandrew Koyanagi MD;  [place]  Service: Ophthalmology;  Laterality: Left;  . ROTATOR CUFF REPAIR Bilateral    left-12/2009; right-03/2009 dr.califf  . TOTAL SHOULDER REPLACEMENT Left    Family History  Problem Relation Age of Onset  . COPD Mother   . Cancer Brother        colon   History  Sexual Activity  . Sexual activity: No    Outpatient Encounter Prescriptions as of 04/16/2017  Medication Sig  . aspirin 81 MG chewable  tablet Chew by mouth.  . betamethasone valerate (VALISONE) 0.1 % cream Apply 1 application topically daily.   . citalopram (CELEXA) 20 MG tablet Take 1 tablet (20 mg total) by mouth daily.  . Cyanocobalamin (B-12) 2500 MCG TABS Take 2,500 mcg by mouth daily.  . ESBRIET 267 MG TABS Take 6 tablets by mouth daily.   .Marland Kitchenestradiol (ESTRACE VAGINAL) 0.1 MG/GM vaginal cream Place 1 Applicatorful vaginally once a week. (1gram weekly)  . HYDROmorphone (DILAUDID) 2 MG tablet Take 2 mg by mouth 2 (two) times daily.   .Marland Kitchenlisinopril (PRINIVIL,ZESTRIL) 5 MG tablet Take 1 tablet (5 mg total) by mouth daily.  .Levin ErpSULFATE VAGINAL (TRIMO-SAN) 0.025 % GEL Place 1 Applicatorful vaginally every 14 (fourteen) days.  . zoledronic acid (RECLAST) 5 MG/100ML SOLN injection Inject into the vein.  . [DISCONTINUED] nystatin (MYCOSTATIN) powder Apply topically 2 (two) times daily.  . [DISCONTINUED] triamcinolone ointment (KENALOG) 0.5 % Apply 1 application topically 2 (two) times daily.   No facility-administered encounter medications on file as of 04/16/2017.     Activities of Daily Living In your present state of health, do you have any difficulty performing the following activities: 04/16/2017 04/26/2016  Hearing? N N  Vision? N  N  Difficulty concentrating or making decisions? Y N  Walking or climbing stairs? Y N  Dressing or bathing? N N  Doing errands, shopping? N N  Preparing Food and eating ? N -  Using the Toilet? N -  In the past six months, have you accidently leaked urine? Y -  Comment wears protection -  Do you have problems with loss of bowel control? N -  Managing your Medications? N -  Managing your Finances? N -  Housekeeping or managing your Housekeeping? N -  Some recent data might be hidden    Patient Care Team: Mar Daring, PA-C as PCP - General (Family Medicine) Leandrew Koyanagi, MD as Referring Physician (Ophthalmology) Erby Pian, MD as Referring Physician  (Specialist) Yolonda Kida, MD as Consulting Physician (Cardiology) Rubie Maid, MD as Referring Physician (Obstetrics and Gynecology) Catalina Antigua as Referring Physician (Physician Assistant)    Assessment:     Exercise Activities and Dietary recommendations Current Exercise Habits: Structured exercise class, Type of exercise: stretching, Time (Minutes): 15, Frequency (Times/Week): 2, Weekly Exercise (Minutes/Week): 30, Intensity: Mild  Goals    . Exercise 150 minutes per week (moderate activity)    . Reduce fat intake           Recommend reducing fat intake in daily food. Pt to cut out chips and ice cream in diet.      Fall Risk Fall Risk  04/16/2017 04/26/2016 04/24/2015  Falls in the past year? No No No   Depression Screen PHQ 2/9 Scores 04/16/2017 04/26/2016 04/24/2015  PHQ - 2 Score 0 0 0     Cognitive Function- Pt declined screening today.         Immunization History  Administered Date(s) Administered  . Influenza, High Dose Seasonal PF 06/21/2015, 05/18/2016  . Pneumococcal Conjugate-13 04/18/2014  . Pneumococcal Polysaccharide-23 07/24/2004, 12/28/2013  . Td 09/02/2001  . Tdap 05/25/2011  . Zoster 04/19/2010   Screening Tests Health Maintenance  Topic Date Due  . INFLUENZA VACCINE  04/02/2017  . TETANUS/TDAP  05/24/2021  . DEXA SCAN  Completed  . PNA vac Low Risk Adult  Completed      Plan:  I have personally reviewed and addressed the Medicare Annual Wellness questionnaire and have noted the following in the patient's chart:  A. Medical and social history B. Use of alcohol, tobacco or illicit drugs  C. Current medications and supplements D. Functional ability and status E.  Nutritional status F.  Physical activity G. Advance directives H. List of other physicians I.  Hospitalizations, surgeries, and ER visits in previous 12 months J.  [place] such as hearing and vision if needed, cognitive and depression L. Referrals  and appointments - none  In addition, I have reviewed and discussed with patient certain preventive protocols, quality metrics, and best practice recommendations. A written personalized care plan for preventive services as well as general preventive health recommendations were provided to patient.  See attached scanned questionnaire for additional information.   Signed,  Fabio Neighbors, LPN  Nurse Health Advisor   MD Recommendations: None.

## 2017-04-17 ENCOUNTER — Encounter: Payer: Self-pay | Admitting: Podiatry

## 2017-04-17 ENCOUNTER — Ambulatory Visit (INDEPENDENT_AMBULATORY_CARE_PROVIDER_SITE_OTHER): Payer: Medicare Other | Admitting: Podiatry

## 2017-04-17 ENCOUNTER — Ambulatory Visit (INDEPENDENT_AMBULATORY_CARE_PROVIDER_SITE_OTHER): Payer: Medicare Other

## 2017-04-17 VITALS — BP 145/74 | HR 55

## 2017-04-17 DIAGNOSIS — R52 Pain, unspecified: Secondary | ICD-10-CM

## 2017-04-17 DIAGNOSIS — G5762 Lesion of plantar nerve, left lower limb: Secondary | ICD-10-CM | POA: Diagnosis not present

## 2017-04-17 DIAGNOSIS — M129 Arthropathy, unspecified: Secondary | ICD-10-CM

## 2017-04-17 DIAGNOSIS — G5782 Other specified mononeuropathies of left lower limb: Secondary | ICD-10-CM

## 2017-04-17 NOTE — Progress Notes (Signed)
   Subjective:    Patient ID: [name], female    DOB: [birth year], 81 y.o.   MRN: [id number]  HPI this patient presents the office with chief complaint of pain noted in her left forefoot.  She states that this pain has been present for approximately 4 weeks.  She says she has no history of trauma or injury to her left foot.  She states that her left foot was hurting and then she proceeded to drop her oxygen tank on her left foot also.  She points to an area between the metatarsals of the left foot as the site of maximum pain.  No evidence of redness, swelling or bruising.  She says this is most painful walking and wearing her shoes.  She has provided no self treatment nor sought any professional help.  She presents the office today for an evaluation and treatment of her left foot.    Review of Systems  All other systems reviewed and are negative.      Objective:   Physical Exam GENERAL APPEARANCE: Alert, conversant. Appropriately groomed. No acute distress.  VASCULAR: Pedal pulses are  weakly palpable at  [facility] and PT bilateral.  Capillary refill time is immediate to all digits,  Cold feet noted.  Purplish discoloration tip of left hallux. NEUROLOGIC: sensation is normal to 5.07 monofilament at 5/5 sites bilateral.  Light touch is intact bilateral, Muscle strength normal.  MUSCULOSKELETAL: acceptable muscle strength, tone and stability bilateral.  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  Dorsal midfoot arthritis  B/L  Bony ossicle noted on midfoot  B/L  palpable pain noted in the third interspace of the left foot.    DERMATOLOGIC: skin color, texture, and turgor are within normal limits.  No preulcerative lesions or ulcers  are seen, no interdigital maceration noted.  No open lesions present.  Digital nails are asymptomatic. No drainage noted.         Assessment & Plan:  Neuroma, left foot.  DJD noted bilaterally/  IE  Xrays were taken  which reveal no bony pathology to her  metatarsals.  She does have arthritic changes noted in the first MPJ.  My examination revealed a neuroma third interspace left foot.  Treated with injection therapy.  .  Discussed proper footgear.  RTC 2 weeks.   Gardiner Barefoot DPM

## 2017-04-21 ENCOUNTER — Telehealth: Payer: Self-pay | Admitting: Physician Assistant

## 2017-04-21 DIAGNOSIS — F419 Anxiety disorder, unspecified: Secondary | ICD-10-CM

## 2017-04-21 NOTE — Telephone Encounter (Signed)
[facility] faxed a request on the following medication. Thanks CC  citalopram (CELEXA) 20 MG tablet  >Take one tablet by mouth once daily.

## 2017-04-22 MED ORDER — CITALOPRAM HYDROBROMIDE 20 MG PO TABS
20.0000 mg | ORAL_TABLET | Freq: Every day | ORAL | 3 refills | Status: DC
Start: 2017-04-22 — End: 2018-05-01

## 2017-04-22 NOTE — Telephone Encounter (Signed)
Rx refilled.

## 2017-04-23 DIAGNOSIS — Z9981 Dependence on supplemental oxygen: Secondary | ICD-10-CM | POA: Diagnosis not present

## 2017-04-23 DIAGNOSIS — R0609 Other forms of dyspnea: Secondary | ICD-10-CM | POA: Diagnosis not present

## 2017-04-23 DIAGNOSIS — G4733 Obstructive sleep apnea (adult) (pediatric): Secondary | ICD-10-CM | POA: Diagnosis not present

## 2017-04-23 DIAGNOSIS — J84112 Idiopathic pulmonary fibrosis: Secondary | ICD-10-CM | POA: Diagnosis not present

## 2017-04-24 DIAGNOSIS — E6609 Other obesity due to excess calories: Secondary | ICD-10-CM | POA: Diagnosis not present

## 2017-04-24 DIAGNOSIS — M48061 Spinal stenosis, lumbar region without neurogenic claudication: Secondary | ICD-10-CM | POA: Diagnosis not present

## 2017-04-28 ENCOUNTER — Ambulatory Visit (INDEPENDENT_AMBULATORY_CARE_PROVIDER_SITE_OTHER): Payer: Medicare Other | Admitting: Physician Assistant

## 2017-04-28 ENCOUNTER — Encounter: Payer: Self-pay | Admitting: Physician Assistant

## 2017-04-28 VITALS — BP 120/78 | HR 64 | Temp 98.1°F | Resp 16 | Ht 60.0 in | Wt 154.2 lb

## 2017-04-28 DIAGNOSIS — M19012 Primary osteoarthritis, left shoulder: Secondary | ICD-10-CM | POA: Diagnosis not present

## 2017-04-28 DIAGNOSIS — M7542 Impingement syndrome of left shoulder: Secondary | ICD-10-CM | POA: Diagnosis not present

## 2017-04-28 DIAGNOSIS — R5383 Other fatigue: Secondary | ICD-10-CM

## 2017-04-28 MED ORDER — METHYLPREDNISOLONE ACETATE 80 MG/ML IJ SUSP
80.0000 mg | Freq: Once | INTRAMUSCULAR | Status: AC
  Administered 2017-04-28: 80 mg via INTRA_ARTICULAR

## 2017-04-28 NOTE — Patient Instructions (Signed)
Shoulder Injection, Care After Refer to this sheet in the next few weeks. These instructions provide you with information about caring for yourself after your procedure. Your health care provider may also give you more specific instructions. Your treatment has been planned according to current medical practices, but problems sometimes occur. Call your health care provider if you have any problems or questions after your procedure. WHAT TO EXPECT AFTER THE PROCEDURE After your procedure, it is common to have:  Soreness.  Warmth.  Swelling. You may have more pain, swelling, and warmth than you did before the injection. This reaction may last for about one day.  HOME CARE INSTRUCTIONS Bathing  If you were given a bandage (dressing), keep it dry until your health care provider says it can be removed. Ask your health care provider when you can start showering or taking a bath. Managing Pain, Stiffness, and Swelling  If directed, apply ice to the injection area:  Put ice in a plastic bag.  Place a towel between your skin and the bag.  Leave the ice on for 20 minutes, 2-3 times per day.  Do not apply heat to your shoulder.  Raise the injection area above the level of your heart while you are sitting or lying down. Activity  Avoid strenuous activities for as long as directed by your health care provider. Ask your health care provider when you can return to your normal activities. General Instructions  Take medicines only as directed by your health care provider.  Do not take aspirin or other over-the-counter medicines unless your health care provider says you can.  Check your injection site every day for signs of infection. Watch for:  Redness, swelling, or pain.  Fluid, blood, or pus.  Follow your health care provider's instructions about dressing changes and removal. SEEK MEDICAL CARE IF:  You have symptoms at your injection site that last longer than two days after your  procedure.  You have redness, swelling, or pain in your injection area.  You have fluid, blood, or pus coming from your injection site.  You have warmth in your injection area.  You have a fever.  Your pain is not controlled with medicine. SEEK IMMEDIATE MEDICAL CARE IF:  Your shoulder turns very red.  Your shoulder becomes very swollen.  Your shoulder pain is severe.   This information is not intended to replace advice given to you by your health care provider. Make sure you discuss any questions you have with your health care provider.   Document Released: 09/09/2014 Document Reviewed: 09/09/2014 Elsevier Interactive Patient Education [employer].

## 2017-04-28 NOTE — Progress Notes (Signed)
Patient: [name], Female    DOB: 1936-08-21, 81 y.o.   MRN: [id number] Visit Date: 04/28/2017  Today's Provider: Mar Daring, PA-C   Chief Complaint  Patient presents with  . Medicare Wellness   Subjective:  [name] is a 81 yr old female that returns today for f/u from her AW[facility] with the [facility]. She reports she is doing fairly well. She is having some increased fatigue. Labs done in June 2018 were all WNL. She is currently also taking B12 OTC. She does not take calcium or Vit D.   She is also complaining of worsening pain in the left shoulder and upper arm. She has had multiple surgeries on this shoulder. She has had complete rotator cuff tear in 2011 with repair, as well as a revision following.   04/26/16 CPE 01/26/14 Mammogram-BI-RADS 1 -----------------------------------------------------------------   Review of Systems  Constitutional: Negative.   HENT: Negative.   Eyes: Negative.   Respiratory: Negative.   Cardiovascular: Negative.   Gastrointestinal: Negative.   Endocrine: Negative.   Genitourinary: Negative.   Musculoskeletal: Negative.   Skin: Negative.   Allergic/Immunologic: Negative.   Neurological: Negative.   Hematological: Negative.   Psychiatric/Behavioral: Negative.     Social History      She  reports that she has never smoked. She has never used smokeless tobacco. She reports that she does not drink alcohol or use drugs.       Social History   Social History  . Marital status: Married    Spouse name: [name]  . Number of children: 2  . Years of education: N/A   Social History Main Topics  . Smoking status: Never Smoker  . Smokeless tobacco: Never Used  . Alcohol use No  . Drug use: No  . Sexual activity: No   Other Topics Concern  . None   Social History Narrative  . None    Past Medical History:  Diagnosis Date  . Altered mental state   . Anxiety   . BMI 30.0-30.9,adult   . Cellulitis   . Chest pain,  atypical   . Compression fracture of L4 lumbar vertebra ([place])   . Constipation   . Cystitis   . Cystocele   . Diverticulosis   . Fatigue   . 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 Gastroenteritis   . History of back surgery   . History of herniated intervertebral disc   . HTN (hypertension)   . Hypercholesteremia   . Hypocalcemia   . Incontinence of urine   . OA (osteoarthritis)    shoulder and back  . Obesity   . Osteopenia   . Pneumonia   . Shortness of breath   . Sleep apnea   . Thrush   . Vitamin D deficiency      Patient Active Problem List   Diagnosis Date Noted  . 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 Tinea corporis 04/26/2016  . Family history of colon cancer requiring screening colonoscopy 04/26/2016  . Slow transit constipation 04/26/2016  . Candidiasis 01/17/2016  . Hypercholesteremia 04/24/2015  . Mild cognitive impairment 04/24/2015  . Compression fracture of L4 lumbar vertebra ([facility]) 04/24/2015  . Vaginal pessary in situ 04/19/2015  . Cystocele with uterine descensus 03/30/2015  . Anxiety 02/02/2015  . Arthritis 02/02/2015  . Hypertension 02/02/2015  . Sleep apnea 02/02/2015  . 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 CATARACT EXTRACTION W/PHACO  Left 04/24/2016   Procedure: CATARACT EXTRACTION PHACO AND INTRAOCULAR LENS PLACEMENT (IOC);  Surgeon: Leandrew Koyanagi, MD;  Location: [place];  Service: Ophthalmology;  Laterality: Left;  . ROTATOR CUFF REPAIR Bilateral    left-12/2009; right-03/2009 dr.califf  . TOTAL SHOULDER REPLACEMENT Left     Family History        Family Status  Relation Status  . Mother Deceased at age 63       lung cancer  . Father Deceased at age 72       ruptured aneurysm  . Sister Alive  . Brother Alive        Her family history includes COPD in her mother; Cancer in her brother.     No Known Allergies   Current Outpatient Prescriptions:  .   aspirin 81 MG chewable tablet, Chew by mouth., Disp: , Rfl:  .  betamethasone valerate (VALISONE) 0.1 % cream, Apply 1 application topically daily. , Disp: , Rfl:  .  citalopram (CELEXA) 20 MG tablet, Take 1 tablet (20 mg total) by mouth daily., Disp: 90 tablet, Rfl: 3 .  Cyanocobalamin (B-12) 2500 MCG TABS, Take 2,500 mcg by mouth daily., Disp: , Rfl:  .  ESBRIET 267 MG TABS, Take 6 tablets by mouth daily. , Disp: , Rfl:  .  estradiol (ESTRACE VAGINAL) 0.1 MG/GM vaginal cream, Place 1 Applicatorful vaginally once a week. (1gram weekly), Disp: 42.5 g, Rfl: 2 .  HYDROmorphone (DILAUDID) 2 MG tablet, Take 2 mg by mouth 2 (two) times daily. , Disp: , Rfl:  .  lisinopril (PRINIVIL,ZESTRIL) 5 MG tablet, Take 1 tablet (5 mg total) by mouth daily., Disp: 90 tablet, Rfl: 1 .  OXYQUINOLONE SULFATE VAGINAL (TRIMO-SAN) 0.025 % GEL, Place 1 Applicatorful vaginally every 14 (fourteen) days., Disp: 45 g, Rfl: 1 .  zoledronic acid (RECLAST) 5 MG/100ML SOLN injection, Inject into the vein., Disp: , Rfl:    Patient Care Team: Mar Daring, PA-C as PCP - General (Family Medicine) Leandrew Koyanagi, MD as Referring Physician (Ophthalmology) Erby Pian, MD as Referring Physician (Specialist) Yolonda Kida, MD as Consulting Physician (Cardiology) Rubie Maid, MD as Referring Physician (Obstetrics and Gynecology) Catalina Antigua as Referring Physician (Physician Assistant)      Objective:   Vitals: BP 120/78 (BP Location: Left Arm, Patient Position: Sitting, Cuff Size: Large)   Pulse 64   Temp 98.1 F (36.7 C) (Oral)   Resp 16   Ht 5' (1.524 m)   Wt 154 lb 3.2 oz (69.9 kg)   BMI 30.12 kg/m    Vitals:   04/28/17 0907  BP: 120/78  Pulse: 64  Resp: 16  Temp: 98.1 F (36.7 C)  TempSrc: Oral  Weight: 154 lb 3.2 oz (69.9 kg)  Height: 5' (1.524 m)     Physical Exam  Constitutional: She appears well-developed and well-nourished. No distress.  Neck: Normal range of  motion. Neck supple. No JVD present. No tracheal deviation present. No thyromegaly present.  Cardiovascular: Normal rate, regular rhythm and normal heart sounds.  Exam reveals no gallop and no friction rub.   No murmur heard. Pulmonary/Chest: Effort normal and breath sounds normal. No respiratory distress. She has no wheezes. She has no rales.  Musculoskeletal: She exhibits no edema.       Left shoulder: She exhibits decreased range of motion, tenderness, pain and decreased strength. She exhibits no bony tenderness, no swelling, no effusion, no crepitus, no deformity, no laceration, no spasm and normal pulse.  Arms: Lymphadenopathy:    She has no cervical adenopathy.  Skin: She is not diaphoretic.  Vitals reviewed.    Depression Screen PHQ 2/9 Scores 04/16/2017 04/26/2016 04/24/2015  PHQ - 2 Score 0 0 0      Assessment & Plan:     Routine Health Maintenance and Physical Exam  Exercise Activities and Dietary recommendations Goals    . Exercise 150 minutes per week (moderate activity)    . Reduce fat intake           Recommend reducing fat intake in daily food. Pt to cut out chips and ice cream in diet.       Immunization History  Administered Date(s) Administered  . Influenza, High Dose Seasonal PF 06/21/2015, 05/18/2016  . Pneumococcal Conjugate-13 04/18/2014  . Pneumococcal Polysaccharide-23 07/24/2004, 12/28/2013  . Td 09/02/2001  . Tdap 05/25/2011  . Zoster 04/19/2010    Health Maintenance  Topic Date Due  . INFLUENZA VACCINE  04/02/2017  . TETANUS/TDAP  05/24/2021  . DEXA SCAN  Completed  . PNA vac Low Risk Adult  Completed     Discussed health benefits of physical activity, and encouraged her to engage in regular exercise appropriate for her age and condition.    1. Primary osteoarthritis of left shoulder Suspect OA due to surgical history as well as previous complete rotator cuff tear. Will give steroid injection as below. See procedure note below.  After care instructions printed and given to patient. She is to call if symptoms worsen. She will return in 4 weeks for high dose flu vaccine as well as Shingrix if covered by the insurance company.  - methylPREDNISolone acetate (DEPO-MEDROL) injection 80 mg; Inject 1 mL (80 mg total) into the articular space once.  2. Rotator cuff impingement syndrome of left shoulder See above medical treatment plan. - methylPREDNISolone acetate (DEPO-MEDROL) injection 80 mg; Inject 1 mL (80 mg total) into the articular space once.  3. Fatigue, unspecified type Suspect due to age and deconditioning. Continue Vit B12 supplement. TSH was normal. No anemia. Advised to restart Vit D and calcium supplementation.   Procedure Note: Benefits, risks (including infection, tattooing, adipose dimpling, and tendon rupture) and alternatives were explained to the patient. All questions were sought and answered.  Patient agreed to continue and verbal consent was obtained.   The skin was cleaned with betadine swabs and prepped in a sterile fashion. A steroid injection was performed on leftt shoulder using 4cc of 1% plain Xyloocaine and 80 mg of depo-medrol. This was well tolerated.   --------------------------------------------------------------------    Mar Daring, PA-C  [facility]

## 2017-05-13 DIAGNOSIS — M818 Other osteoporosis without current pathological fracture: Secondary | ICD-10-CM | POA: Diagnosis not present

## 2017-05-20 ENCOUNTER — Encounter: Payer: Self-pay | Admitting: Obstetrics and Gynecology

## 2017-05-20 ENCOUNTER — Ambulatory Visit (INDEPENDENT_AMBULATORY_CARE_PROVIDER_SITE_OTHER): Payer: Medicare Other | Admitting: Obstetrics and Gynecology

## 2017-05-20 VITALS — BP 131/74 | HR 60 | Wt 154.8 lb

## 2017-05-20 DIAGNOSIS — Z9289 Personal history of other medical treatment: Secondary | ICD-10-CM

## 2017-05-20 DIAGNOSIS — N952 Postmenopausal atrophic vaginitis: Secondary | ICD-10-CM

## 2017-05-20 DIAGNOSIS — L539 Erythematous condition, unspecified: Secondary | ICD-10-CM

## 2017-05-20 DIAGNOSIS — Z960 Presence of urogenital implants: Secondary | ICD-10-CM

## 2017-05-20 DIAGNOSIS — N8110 Cystocele, unspecified: Secondary | ICD-10-CM | POA: Diagnosis not present

## 2017-05-20 NOTE — Progress Notes (Signed)
    GYNECOLOGY PROGRESS NOTE  Subjective:    Patient ID: [name], female    DOB: [birth year], 81 y.o.   MRN: [id number]  HPI  Patient is a 81 y.o. G2P2 female who presents for pessary check. She reports no vaginal bleeding or discharge. She denies pelvic discomfort and difficulty urinating or moving her bowels.   ?  The following portions of the patient's history were reviewed and updated as appropriate: allergies, current medications, past family history, past medical history, past social history, past surgical history and problem list.  Review of Systems Pertinent items noted in HPI and remainder of comprehensive ROS otherwise negative.   Objective:   Blood pressure 131/74, pulse 60, weight 154 lb 12.8 oz (70.2 kg).    General appearance: alert and no distress  Abdomen: soft, non-tender. No masses palpable or organomegaly.  Pelvis: Vulva with redness of skin of labia majora in a rectangular distribution bilaterally, no lesions. Urethra with moderate atrophy (however improved since last visit). Vagina atrophic (however improved since last visit). The patient's Size 3 ring with support pessary was removed, and cleaned. Speculum examination revealed normal vaginal mucosa with no lesions.    Assessment:   Pessary in situ Cystocele Vaginal laceration resolved Vaginal atrophy Redness of vaginal skin  Plan:   - Continue to use Trimo-san gel monthly.   - Continue Premarin cream for vaginal atrophy 1-2 times weekly - Redness of vaginal skin likely due to vaginal moisture, patient has always had increase in leukorrhea since use of the pessary and maintenance creams/gels.  Notes the redness comes and goes.  Does not itch or irritate.  Advised on absorptive panty-liners and changing frequently (at least 3-4 times daily).  Can also use corn starch or powder to decrease moisture.   - To f/u in 10-12 weeks for pessary check.     Rubie Maid, MD [facility]

## 2017-05-20 NOTE — Progress Notes (Signed)
Patient comes in today for a pessary check. She feels well today with no other complaints.

## 2017-05-23 ENCOUNTER — Other Ambulatory Visit: Payer: Self-pay | Admitting: Physician Assistant

## 2017-05-23 DIAGNOSIS — Z1231 Encounter for screening mammogram for malignant neoplasm of breast: Secondary | ICD-10-CM

## 2017-05-26 DIAGNOSIS — M461 Sacroiliitis, not elsewhere classified: Secondary | ICD-10-CM | POA: Diagnosis not present

## 2017-05-26 DIAGNOSIS — K5909 Other constipation: Secondary | ICD-10-CM | POA: Diagnosis not present

## 2017-05-26 DIAGNOSIS — M5126 Other intervertebral disc displacement, lumbar region: Secondary | ICD-10-CM | POA: Diagnosis not present

## 2017-05-26 DIAGNOSIS — M791 Myalgia: Secondary | ICD-10-CM | POA: Diagnosis not present

## 2017-05-26 DIAGNOSIS — M545 Low back pain: Secondary | ICD-10-CM | POA: Diagnosis not present

## 2017-05-26 DIAGNOSIS — M5137 Other intervertebral disc degeneration, lumbosacral region: Secondary | ICD-10-CM | POA: Diagnosis not present

## 2017-05-26 DIAGNOSIS — M48061 Spinal stenosis, lumbar region without neurogenic claudication: Secondary | ICD-10-CM | POA: Diagnosis not present

## 2017-05-26 DIAGNOSIS — M5116 Intervertebral disc disorders with radiculopathy, lumbar region: Secondary | ICD-10-CM | POA: Diagnosis not present

## 2017-05-26 DIAGNOSIS — M7061 Trochanteric bursitis, right hip: Secondary | ICD-10-CM | POA: Diagnosis not present

## 2017-05-26 DIAGNOSIS — M47896 Other spondylosis, lumbar region: Secondary | ICD-10-CM | POA: Diagnosis not present

## 2017-05-31 ENCOUNTER — Ambulatory Visit (INDEPENDENT_AMBULATORY_CARE_PROVIDER_SITE_OTHER): Payer: Medicare Other

## 2017-05-31 DIAGNOSIS — Z23 Encounter for immunization: Secondary | ICD-10-CM | POA: Diagnosis not present

## 2017-06-03 DIAGNOSIS — H2511 Age-related nuclear cataract, right eye: Secondary | ICD-10-CM | POA: Diagnosis not present

## 2017-06-11 ENCOUNTER — Other Ambulatory Visit: Payer: Self-pay | Admitting: Physician Assistant

## 2017-06-11 ENCOUNTER — Ambulatory Visit
Admission: RE | Admit: 2017-06-11 | Discharge: 2017-06-11 | Disposition: A | Discharge status: Home / Self Care | Payer: Medicare Other | Source: Ambulatory Visit | Attending: Physician Assistant | Admitting: Physician Assistant

## 2017-06-11 DIAGNOSIS — N6320 Unspecified lump in the left breast, unspecified quadrant: Secondary | ICD-10-CM

## 2017-06-11 DIAGNOSIS — R928 Other abnormal and inconclusive findings on diagnostic imaging of breast: Secondary | ICD-10-CM

## 2017-06-11 DIAGNOSIS — Z1231 Encounter for screening mammogram for malignant neoplasm of breast: Secondary | ICD-10-CM | POA: Diagnosis not present

## 2017-06-18 ENCOUNTER — Ambulatory Visit
Admission: RE | Admit: 2017-06-18 | Discharge: 2017-06-18 | Disposition: A | Discharge status: Home / Self Care | Payer: Medicare Other | Source: Ambulatory Visit | Attending: Physician Assistant | Admitting: Physician Assistant

## 2017-06-18 DIAGNOSIS — N6320 Unspecified lump in the left breast, unspecified quadrant: Secondary | ICD-10-CM

## 2017-06-18 DIAGNOSIS — R928 Other abnormal and inconclusive findings on diagnostic imaging of breast: Secondary | ICD-10-CM | POA: Diagnosis not present

## 2017-06-18 DIAGNOSIS — N6489 Other specified disorders of breast: Secondary | ICD-10-CM | POA: Diagnosis not present

## 2017-06-20 ENCOUNTER — Telehealth: Payer: Self-pay

## 2017-06-20 NOTE — Telephone Encounter (Signed)
Patient advised as directed below.  Thanks,  -Joseline 

## 2017-06-20 NOTE — Telephone Encounter (Signed)
-----   Message from Mar Daring, [place] sent at 06/18/2017 11:27 AM EDT ----- Abnormality was found to be an intramammary lymph node. No malignancy. Return to routine breast screenings annually.

## 2017-07-01 DIAGNOSIS — H2511 Age-related nuclear cataract, right eye: Secondary | ICD-10-CM | POA: Diagnosis not present

## 2017-07-02 ENCOUNTER — Encounter: Payer: Self-pay | Admitting: *Deleted

## 2017-07-11 NOTE — Discharge Instructions (Signed)
Cataract Surgery, Care After Refer to this sheet in the next few weeks. These instructions provide you with information about caring for yourself after your procedure. Your health care provider may also give you more specific instructions. Your treatment has been planned according to current medical practices, but problems sometimes occur. Call your health care provider if you have any problems or questions after your procedure. What can I expect after the procedure? After the procedure, it is common to have:  Itching.  Discomfort.  Fluid discharge.  Sensitivity to light and to touch.  Bruising.  Follow these instructions at home: [place] your eye every day for signs of infection. Watch for: ? Redness, swelling, or pain. ? Fluid, blood, or pus. ? Warmth. ? Bad smell. Activity  Avoid strenuous activities, such as playing contact sports, for as long as told by your health care provider.  Do not drive or operate heavy machinery until your health care provider approves.  Do not bend or lift heavy objects. Bending increases pressure in the eye. You can walk, climb stairs, and do light household chores.  Ask your health care provider when you can return to work. If you work in a dusty environment, you may be advised to wear protective eyewear for a period of time. General instructions  Take or apply over-the-counter and prescription medicines only as told by your health care provider. This includes eye drops.  Do not touch or rub your eyes.  If you were given a protective shield, wear it as told by your health care provider. If you were not given a protective shield, wear sunglasses as told by your health care provider to protect your eyes.  Keep the area around your eye clean and dry. Avoid swimming or allowing water to hit you directly in the face while showering until told by your health care provider. Keep soap and shampoo out of your eyes.  Do not put a contact lens  into the affected eye or eyes until your health care provider approves.  Keep all follow-up visits as told by your health care provider. This is important. Contact a health care provider if:   You have increased bruising around your eye.  You have pain that is not helped with medicine.  You have a fever.  You have redness, swelling, or pain in your eye.  You have fluid, blood, or pus coming from your incision.  Your vision gets worse. Get help right away if:  You have sudden vision loss. This information is not intended to replace advice given to you by your health care provider. Make sure you discuss any questions you have with your health care provider. Document Released: 03/08/2005 Document Revised: 12/28/2015 Document Reviewed: 06/29/2015 Elsevier Interactive Patient Education  2017 [place] Anesthesia, Adult, Care After These instructions provide you with information about caring for yourself after your procedure. Your health care provider may also give you more specific instructions. Your treatment has been planned according to current medical practices, but problems sometimes occur. Call your health care provider if you have any problems or questions after your procedure. What can I expect after the procedure? After the procedure, it is common to have:  Vomiting.  A sore throat.  Mental slowness.  It is common to feel:  Nauseous.  Cold or shivery.  Sleepy.  Tired.  Sore or achy, even in parts of your body where you did not have surgery.  Follow these instructions at home: For  at least 24 hours after the procedure:  Do not: ? Participate in activities where you could fall or become injured. ? Drive. ? Use heavy machinery. ? Drink alcohol. ? Take sleeping pills or medicines that cause drowsiness. ? Make important decisions or sign legal documents. ? Take care of children on your own.  Rest. Eating and drinking  If you vomit, drink  water, juice, or soup when you can drink without vomiting.  Drink enough fluid to keep your urine clear or pale yellow.  Make sure you have little or no nausea before eating solid foods.  Follow the diet recommended by your health care provider. General instructions  Have a responsible adult stay with you until you are awake and alert.  Return to your normal activities as told by your health care provider. Ask your health care provider what activities are safe for you.  Take over-the-counter and prescription medicines only as told by your health care provider.  If you smoke, do not smoke without supervision.  Keep all follow-up visits as told by your health care provider. This is important. Contact a health care provider if:  You continue to have nausea or vomiting at home, and medicines are not helpful.  You cannot drink fluids or start eating again.  You cannot urinate after 8-12 hours.  You develop a skin rash.  You have fever.  You have increasing redness at the site of your procedure. Get help right away if:  You have difficulty breathing.  You have chest pain.  You have unexpected bleeding.  You feel that you are having a life-threatening or urgent problem. This information is not intended to replace advice given to you by your health care provider. Make sure you discuss any questions you have with your health care provider. Document Released: 11/25/2000 Document Revised: 01/22/2016 Document Reviewed: 08/03/2015 Elsevier Interactive Patient Education  [employer].

## 2017-07-14 ENCOUNTER — Ambulatory Visit
Admission: RE | Admit: 2017-07-14 | Discharge: 2017-07-14 | Disposition: A | Discharge status: Home / Self Care | Payer: Medicare Other | Source: Ambulatory Visit | Attending: Ophthalmology | Admitting: Ophthalmology

## 2017-07-14 ENCOUNTER — Encounter: Payer: Self-pay | Admitting: *Deleted

## 2017-07-14 ENCOUNTER — Ambulatory Visit: Payer: Medicare Other | Admitting: Anesthesiology

## 2017-07-14 ENCOUNTER — Encounter
Admission: RE | Disposition: A | Discharge status: Home / Self Care | Payer: Self-pay | Source: Ambulatory Visit | Attending: Ophthalmology

## 2017-07-14 DIAGNOSIS — Z79890 Hormone replacement therapy (postmenopausal): Secondary | ICD-10-CM | POA: Insufficient documentation

## 2017-07-14 DIAGNOSIS — I10 Essential (primary) hypertension: Secondary | ICD-10-CM | POA: Diagnosis not present

## 2017-07-14 DIAGNOSIS — H2511 Age-related nuclear cataract, right eye: Secondary | ICD-10-CM | POA: Diagnosis not present

## 2017-07-14 DIAGNOSIS — Z7982 Long term (current) use of aspirin: Secondary | ICD-10-CM | POA: Diagnosis not present

## 2017-07-14 DIAGNOSIS — G4730 Sleep apnea, unspecified: Secondary | ICD-10-CM | POA: Diagnosis not present

## 2017-07-14 DIAGNOSIS — Z79899 Other long term (current) drug therapy: Secondary | ICD-10-CM | POA: Insufficient documentation

## 2017-07-14 DIAGNOSIS — Z79891 Long term (current) use of opiate analgesic: Secondary | ICD-10-CM | POA: Diagnosis not present

## 2017-07-14 DIAGNOSIS — Z96612 Presence of left artificial shoulder joint: Secondary | ICD-10-CM | POA: Insufficient documentation

## 2017-07-14 DIAGNOSIS — Z9981 Dependence on supplemental oxygen: Secondary | ICD-10-CM | POA: Diagnosis not present

## 2017-07-14 DIAGNOSIS — F419 Anxiety disorder, unspecified: Secondary | ICD-10-CM | POA: Insufficient documentation

## 2017-07-14 HISTORY — PX: CATARACT EXTRACTION W/PHACO: SHX586

## 2017-07-14 SURGERY — PHACOEMULSIFICATION, CATARACT, WITH IOL INSERTION
Anesthesia: Monitor Anesthesia Care | Laterality: Right | Wound class: Clean

## 2017-07-14 MED ORDER — MOXIFLOXACIN HCL 0.5 % OP SOLN
1.0000 [drp] | OPHTHALMIC | Status: DC | PRN
  Administered 2017-07-14 (×3): 1 [drp] via OPHTHALMIC

## 2017-07-14 MED ORDER — NA HYALUR & NA CHOND-NA HYALUR 0.4-0.35 ML IO KIT
PACK | INTRAOCULAR | Status: DC | PRN
  Administered 2017-07-14: 1 mL via INTRAOCULAR

## 2017-07-14 MED ORDER — CEFUROXIME OPHTHALMIC INJECTION 1 MG/0.1 ML
INJECTION | OPHTHALMIC | Status: DC | PRN
  Administered 2017-07-14: 0.1 mL via INTRACAMERAL

## 2017-07-14 MED ORDER — MIDAZOLAM HCL 2 MG/2ML IJ SOLN
INTRAMUSCULAR | Status: DC | PRN
  Administered 2017-07-14: 2 mg via INTRAVENOUS

## 2017-07-14 MED ORDER — FENTANYL CITRATE (PF) 100 MCG/2ML IJ SOLN
INTRAMUSCULAR | Status: DC | PRN
  Administered 2017-07-14: 50 ug via INTRAVENOUS

## 2017-07-14 MED ORDER — BSS IO SOLN
INTRAOCULAR | Status: DC | PRN
  Administered 2017-07-14: 50 mL via OPHTHALMIC

## 2017-07-14 MED ORDER — LACTATED RINGERS IV SOLN: 1000.0000 mL | INTRAVENOUS | Status: DC

## 2017-07-14 MED ORDER — ARMC OPHTHALMIC DILATING DROPS
1.0000 "application " | OPHTHALMIC | Status: DC | PRN
  Administered 2017-07-14 (×3): 1 via OPHTHALMIC

## 2017-07-14 MED ORDER — BRIMONIDINE TARTRATE-TIMOLOL 0.2-0.5 % OP SOLN
OPHTHALMIC | Status: DC | PRN
  Administered 2017-07-14: 1 [drp] via OPHTHALMIC

## 2017-07-14 SURGICAL SUPPLY — 21 items
CANNULA ANT/CHMB 27G (MISCELLANEOUS) ×1 IMPLANT
CANNULA ANT/CHMB 27GA (MISCELLANEOUS) ×3 IMPLANT
GLOVE SURG LX 7.5 STRW (GLOVE) ×2
GLOVE SURG LX STRL 7.5 STRW (GLOVE) ×1 IMPLANT
GLOVE SURG TRIUMPH 8.0 PF LTX (GLOVE) ×3 IMPLANT
GOWN STRL REUS W/ TWL LRG LVL3 (GOWN DISPOSABLE) ×2 IMPLANT
GOWN STRL REUS W/TWL LRG LVL3 (GOWN DISPOSABLE) ×6
LENS IOL TECNIS ITEC 21.5 (Intraocular Lens) ×3 IMPLANT
MARKER SKIN DUAL TIP RULER LAB (MISCELLANEOUS) ×3 IMPLANT
NDL FILTER BLUNT 18X1 1/2 (NEEDLE) ×1 IMPLANT
NEEDLE FILTER BLUNT 18X 1/2SAF (NEEDLE) ×2
NEEDLE FILTER BLUNT 18X1 1/2 (NEEDLE) ×1 IMPLANT
PACK CATARACT BRASINGTON (MISCELLANEOUS) ×3 IMPLANT
PACK EYE AFTER SURG (MISCELLANEOUS) ×3 IMPLANT
PACK OPTHALMIC (MISCELLANEOUS) ×3 IMPLANT
RING MALYGIN 7.0 (MISCELLANEOUS) IMPLANT
SYR 3ML LL SCALE MARK (SYRINGE) ×3 IMPLANT
SYR 5ML LL (SYRINGE) ×3 IMPLANT
SYR TB 1ML LUER SLIP (SYRINGE) ×3 IMPLANT
WATER STERILE IRR 250ML POUR (IV SOLUTION) ×3 IMPLANT
WIPE NON LINTING 3.25X3.25 (MISCELLANEOUS) ×3 IMPLANT

## 2017-07-14 NOTE — Anesthesia Preprocedure Evaluation (Addendum)
Anesthesia Evaluation  Patient identified by MRN, date of birth, ID band Patient awake    Reviewed: Allergy & Precautions, NPO status , Patient's Chart, lab work & pertinent test results, reviewed documented beta blocker date and time   Airway Mallampati: II  TM Distance: >3 FB Neck ROM: Full    Dental no notable dental hx.    Pulmonary shortness of breath (IPF on home O2), sleep apnea ,  IPF   Pulmonary exam normal breath sounds clear to auscultation       Cardiovascular hypertension, Normal cardiovascular exam Rhythm:Regular Rate:Normal     Neuro/Psych Anxiety negative neurological ROS     GI/Hepatic negative GI ROS, Neg liver ROS,   Endo/Other  negative endocrine ROS  Renal/GU negative Renal ROS  negative genitourinary   Musculoskeletal  (+) Arthritis ,   Abdominal (+) + obese,   Peds  Hematology negative hematology ROS (+)   Anesthesia Other Findings   Reproductive/Obstetrics                            Anesthesia Physical Anesthesia Plan  ASA: III  Anesthesia Plan: MAC   Post-op Pain Management:    Induction: Intravenous  PONV Risk Score and Plan:   Airway Management Planned: Nasal Cannula  Additional Equipment: None  Intra-op Plan:   Post-operative Plan:   Informed Consent: I have reviewed the patients History and Physical, chart, labs and discussed the procedure including the risks, benefits and alternatives for the proposed anesthesia with the patient or authorized representative who has indicated his/her understanding and acceptance.     Plan Discussed with: CRNA, Anesthesiologist and Surgeon  Anesthesia Plan Comments:         Anesthesia Quick Evaluation

## 2017-07-14 NOTE — Transfer of Care (Signed)
Immediate Anesthesia Transfer of Care Note  Patient: [name]  Procedure(s) Performed: CATARACT EXTRACTION PHACO AND INTRAOCULAR LENS PLACEMENT (IOC)  RIGHT (Right )  Patient Location: PACU  Anesthesia Type: MAC  Level of Consciousness: awake, alert  and patient cooperative  Airway and Oxygen Therapy: Patient Spontanous Breathing and Patient connected to supplemental oxygen  Post-op Assessment: Post-op Vital signs reviewed, Patient's Cardiovascular Status Stable, Respiratory Function Stable, Patent Airway and No signs of Nausea or vomiting  Post-op Vital Signs: Reviewed and stable  Complications: No apparent anesthesia complications

## 2017-07-14 NOTE — Op Note (Signed)
LOCATION:  [place]   PREOPERATIVE DIAGNOSIS:    Nuclear sclerotic cataract right eye. H25.11   POSTOPERATIVE DIAGNOSIS:  Nuclear sclerotic cataract right eye.     PROCEDURE:  Phacoemusification with posterior chamber intraocular lens placement of the right eye   LENS:   Implant Name Type Inv. Item Serial No. Manufacturer Lot No. LRB No. Used  LENS IOL DIOP 21.5 - [id number] Intraocular Lens LENS IOL DIOP 21.5 [id number]  Right 1        ULTRASOUND TIME: 14.5 % of 0 minutes, 55 seconds.  CDE 7.9   SURGEON:  Wyonia Hough, MD   ANESTHESIA:  Topical with tetracaine drops and 2% Xylocaine jelly, augmented with 1% preservative-free intracameral lidocaine.    COMPLICATIONS:  None.   DESCRIPTION OF PROCEDURE:  The patient was identified in the holding room and transported to the operating room and placed in the supine position under the operating microscope.  The right eye was identified as the operative eye and it was prepped and draped in the usual sterile ophthalmic fashion.   A 1 millimeter clear-corneal paracentesis was made at the 12:00 position.  0.5 ml of preservative-free 1% lidocaine was injected into the anterior chamber. The anterior chamber was filled with Viscoat viscoelastic.  A 2.4 millimeter keratome was used to make a near-clear corneal incision at the 9:00 position.  A curvilinear capsulorrhexis was made with a cystotome and capsulorrhexis forceps.  Balanced salt solution was used to hydrodissect and hydrodelineate the nucleus.   Phacoemulsification was then used in stop and chop fashion to remove the lens nucleus and epinucleus.  The remaining cortex was then removed using the irrigation and aspiration handpiece. Provisc was then placed into the capsular bag to distend it for lens placement.  A lens was then injected into the capsular bag.  The remaining viscoelastic was aspirated.   Wounds were hydrated with balanced salt solution.  The anterior  chamber was inflated to a physiologic pressure with balanced salt solution.  No wound leaks were noted. Cefuroxime 0.1 ml of a 62m/ml solution was injected into the anterior chamber for a dose of 1 mg of intracameral antibiotic at the completion of the case.   Timolol and Brimonidine drops were applied to the eye.  The patient was taken to the recovery room in stable condition without complications of anesthesia or surgery.   [name] 07/14/2017, 11:26 AM

## 2017-07-14 NOTE — Anesthesia Procedure Notes (Signed)
Procedure Name: MAC Performed by: Londell Moh, CRNA Pre-anesthesia Checklist: Patient identified, Emergency Drugs available, Suction available, Timeout performed and Patient being monitored Patient Re-evaluated:Patient Re-evaluated prior to induction Oxygen Delivery Method: Nasal cannula Placement Confirmation: positive ETCO2

## 2017-07-14 NOTE — H&P (Signed)
The History and Physical notes are on paper, have been signed, and are to be scanned. The patient remains stable and unchanged from the H&P.   Previous H&P reviewed, patient examined, and there are no changes.  [name] 07/14/2017 10:33 AM

## 2017-07-14 NOTE — Anesthesia Postprocedure Evaluation (Signed)
Anesthesia Post Note  Patient: [name]  Procedure(s) Performed: CATARACT EXTRACTION PHACO AND INTRAOCULAR LENS PLACEMENT (IOC)  RIGHT (Right )  Patient location during evaluation: PACU Anesthesia Type: MAC Level of consciousness: awake Pain management: pain level controlled Vital Signs Assessment: post-procedure vital signs reviewed and stable Respiratory status: spontaneous breathing Cardiovascular status: blood pressure returned to baseline Postop Assessment: no headache Anesthetic complications: no    Lavonna Monarch

## 2017-07-15 ENCOUNTER — Encounter: Payer: Self-pay | Admitting: Ophthalmology

## 2017-07-30 ENCOUNTER — Encounter: Payer: Self-pay | Admitting: Obstetrics and Gynecology

## 2017-07-30 ENCOUNTER — Ambulatory Visit (INDEPENDENT_AMBULATORY_CARE_PROVIDER_SITE_OTHER): Payer: Medicare Other | Admitting: Obstetrics and Gynecology

## 2017-07-30 VITALS — BP 137/63 | HR 64 | Ht 60.0 in | Wt 155.8 lb

## 2017-07-30 DIAGNOSIS — R3989 Other symptoms and signs involving the genitourinary system: Secondary | ICD-10-CM

## 2017-07-30 DIAGNOSIS — Z4689 Encounter for fitting and adjustment of other specified devices: Secondary | ICD-10-CM

## 2017-07-30 DIAGNOSIS — N952 Postmenopausal atrophic vaginitis: Secondary | ICD-10-CM

## 2017-07-30 DIAGNOSIS — N8110 Cystocele, unspecified: Secondary | ICD-10-CM | POA: Diagnosis not present

## 2017-07-30 LAB — POCT URINALYSIS DIPSTICK
Bilirubin, UA: NEGATIVE
Glucose, UA: NEGATIVE
Ketones, UA: NEGATIVE
Nitrite, UA: NEGATIVE
Protein, UA: NEGATIVE
Spec Grav, UA: 1.015 (ref 1.010–1.025)
Urobilinogen, UA: 0.2 E.U./dL
pH, UA: 7 (ref 5.0–8.0)

## 2017-07-30 NOTE — Progress Notes (Signed)
    GYNECOLOGY PROGRESS NOTE  Subjective:    Patient ID: [name], female    DOB: 08/19/1936, 81 y.o.   MRN: [id number]  HPI  Patient is a 81 y.o. G2P2 female who presents for pessary check. She reports no discharge. She does She denies pelvic discomfort and difficulty urinating or moving her bowels.  Does report some occasional spotting when trying to use vaginal applicator for estrogen cream.  Is only using cream once weekly.  Also noting a discoloration of her urine. Denies odor, dysuria or hematuria.  ? The following portions of the patient's history were reviewed and updated as appropriate: allergies, current medications, past family history, past medical history, past social history, past surgical history and problem list.  Review of Systems Pertinent items noted in HPI and remainder of comprehensive ROS otherwise negative.   Objective:   Blood pressure 137/63, pulse 64, height 5' (1.524 m), weight 155 lb 12.8 oz (70.7 kg).    General appearance: alert and no distress  Abdomen: soft, non-tender. No masses palpable or organomegaly. No suprapubic tenderness.  Back: No CVA tenderness.  Pelvis: Vulva with redness of skin of labia majora in a rectangular distribution bilaterally, no lesions. Urethra with moderate atrophy  Vagina atrophic.. The patient's Size 3 ring with support pessary was removed, and cleaned. Speculum examination revealed normal vaginal mucosa with no lesions.    Labs:   Results for orders placed or performed in visit on 07/30/17  POCT urinalysis dipstick  Result Value Ref Range   Color, UA yellow    Clarity, UA cloudy    Glucose, UA neg    Bilirubin, UA neg    Ketones, UA neg    Spec Grav, UA 1.015 1.010 - 1.025   Blood, UA trace- non hemo    pH, UA 7.0 5.0 - 8.0   Protein, UA neg    Urobilinogen, UA 0.2 0.2 or 1.0 E.U./dL   Nitrite, UA neg    Leukocytes, UA Moderate (2+) (A) Negative     Assessment:   Pessary in situ Cystocele Vaginal  atrophy Abnormal urine color  Plan:   - Continue to use Trimo-san gel monthly.   - Continue Premarin cream for vaginal atrophy, increase use to 2-3 times weekly - Redness of vaginal skin likely due to vaginal moisture, patient has always had increase in leukorrhea since use of the pessary and maintenance creams/gels.  Notes the redness comes and goes.  Does not itch or irritate.  Advised on absorptive panty-liners and changing frequently (at least 3-4 times daily).  Can also use corn starch or powder to decrease moisture.   - UA today normal.  - To f/u in 10-12 weeks for pessary check.     Rubie Maid, MD [facility]

## 2017-07-31 ENCOUNTER — Encounter: Payer: Medicare Other | Admitting: Obstetrics and Gynecology

## 2017-07-31 DIAGNOSIS — M5137 Other intervertebral disc degeneration, lumbosacral region: Secondary | ICD-10-CM | POA: Diagnosis not present

## 2017-07-31 DIAGNOSIS — M7061 Trochanteric bursitis, right hip: Secondary | ICD-10-CM | POA: Diagnosis not present

## 2017-07-31 DIAGNOSIS — K5909 Other constipation: Secondary | ICD-10-CM | POA: Diagnosis not present

## 2017-07-31 DIAGNOSIS — M461 Sacroiliitis, not elsewhere classified: Secondary | ICD-10-CM | POA: Diagnosis not present

## 2017-07-31 DIAGNOSIS — M545 Low back pain: Secondary | ICD-10-CM | POA: Diagnosis not present

## 2017-07-31 DIAGNOSIS — M5126 Other intervertebral disc displacement, lumbar region: Secondary | ICD-10-CM | POA: Diagnosis not present

## 2017-07-31 DIAGNOSIS — M47896 Other spondylosis, lumbar region: Secondary | ICD-10-CM | POA: Diagnosis not present

## 2017-07-31 DIAGNOSIS — M48061 Spinal stenosis, lumbar region without neurogenic claudication: Secondary | ICD-10-CM | POA: Diagnosis not present

## 2017-07-31 DIAGNOSIS — M5116 Intervertebral disc disorders with radiculopathy, lumbar region: Secondary | ICD-10-CM | POA: Diagnosis not present

## 2017-08-04 DIAGNOSIS — J8410 Pulmonary fibrosis, unspecified: Secondary | ICD-10-CM | POA: Diagnosis not present

## 2017-08-04 DIAGNOSIS — R0602 Shortness of breath: Secondary | ICD-10-CM | POA: Diagnosis not present

## 2017-08-04 DIAGNOSIS — G4733 Obstructive sleep apnea (adult) (pediatric): Secondary | ICD-10-CM | POA: Diagnosis not present

## 2017-08-04 DIAGNOSIS — R69 Illness, unspecified: Secondary | ICD-10-CM | POA: Diagnosis not present

## 2017-08-14 DIAGNOSIS — E6609 Other obesity due to excess calories: Secondary | ICD-10-CM | POA: Diagnosis not present

## 2017-08-14 DIAGNOSIS — M5136 Other intervertebral disc degeneration, lumbar region: Secondary | ICD-10-CM | POA: Diagnosis not present

## 2017-08-14 DIAGNOSIS — M48061 Spinal stenosis, lumbar region without neurogenic claudication: Secondary | ICD-10-CM | POA: Diagnosis not present

## 2017-08-21 DIAGNOSIS — E6609 Other obesity due to excess calories: Secondary | ICD-10-CM | POA: Diagnosis not present

## 2017-08-21 DIAGNOSIS — M48061 Spinal stenosis, lumbar region without neurogenic claudication: Secondary | ICD-10-CM | POA: Diagnosis not present

## 2017-08-21 DIAGNOSIS — M5136 Other intervertebral disc degeneration, lumbar region: Secondary | ICD-10-CM | POA: Diagnosis not present

## 2017-08-28 ENCOUNTER — Telehealth: Payer: Self-pay | Admitting: *Deleted

## 2017-08-28 MED ORDER — NYSTATIN-TRIAMCINOLONE 100000-0.1 UNIT/GM-% EX OINT: 1.0000 "application " | TOPICAL_OINTMENT | Freq: Two times a day (BID) | CUTANEOUS | 0 refills | Status: DC

## 2017-08-28 NOTE — Telephone Encounter (Signed)
Patient called and states she has a bad itch located in her vaginal area and she would like something called in for it. Patient pharmacy is [facility] on [address]. Patient is requesting a call back on the decsion behind the RX. Her contact number is [phone]. Please advise. Thank you

## 2017-08-28 NOTE — Telephone Encounter (Signed)
Pt states she has had itching on the vulva x 2weeks. Slight d/c and odor. Pt states that is her normal. She has pessary and uses premarin and trimosan. Nystatin triamcinolone erx. Pt aware if no better in 7 days to make an appt to be seen.

## 2017-08-29 ENCOUNTER — Telehealth: Payer: Self-pay | Admitting: Obstetrics and Gynecology

## 2017-08-29 MED ORDER — NYSTATIN 100000 UNIT/GM EX OINT: 1.0000 "application " | TOPICAL_OINTMENT | Freq: Two times a day (BID) | CUTANEOUS | 0 refills | Status: DC

## 2017-08-29 NOTE — Telephone Encounter (Signed)
Spoke with pt and she states she went to the pharmacy to pick her rx of nystatin-triamcinolone and states its too expensive. Patient states she already has rx of triamcinolone, if we could just send in the nystatin. Verified pharmacy and sent in rx in.   [facility] this is a Pharmacist, hospital

## 2017-08-29 NOTE — Telephone Encounter (Signed)
The patient called and stated that she would like to speak with Dr. Andreas Blower nurse in regards to changing her medication. No other information was disclosed other than wanting to speak with someone as soon as possible. Please advise.

## 2017-09-08 DIAGNOSIS — M6281 Muscle weakness (generalized): Secondary | ICD-10-CM | POA: Diagnosis not present

## 2017-09-08 DIAGNOSIS — R262 Difficulty in walking, not elsewhere classified: Secondary | ICD-10-CM | POA: Diagnosis not present

## 2017-09-08 DIAGNOSIS — M545 Low back pain: Secondary | ICD-10-CM | POA: Diagnosis not present

## 2017-09-09 DIAGNOSIS — M5116 Intervertebral disc disorders with radiculopathy, lumbar region: Secondary | ICD-10-CM | POA: Diagnosis not present

## 2017-09-09 DIAGNOSIS — M5126 Other intervertebral disc displacement, lumbar region: Secondary | ICD-10-CM | POA: Diagnosis not present

## 2017-09-09 DIAGNOSIS — K5909 Other constipation: Secondary | ICD-10-CM | POA: Diagnosis not present

## 2017-09-09 DIAGNOSIS — M5137 Other intervertebral disc degeneration, lumbosacral region: Secondary | ICD-10-CM | POA: Diagnosis not present

## 2017-09-09 DIAGNOSIS — M545 Low back pain: Secondary | ICD-10-CM | POA: Diagnosis not present

## 2017-09-09 DIAGNOSIS — M549 Dorsalgia, unspecified: Secondary | ICD-10-CM | POA: Diagnosis not present

## 2017-09-09 DIAGNOSIS — M48061 Spinal stenosis, lumbar region without neurogenic claudication: Secondary | ICD-10-CM | POA: Diagnosis not present

## 2017-09-09 DIAGNOSIS — M47896 Other spondylosis, lumbar region: Secondary | ICD-10-CM | POA: Diagnosis not present

## 2017-09-09 DIAGNOSIS — M461 Sacroiliitis, not elsewhere classified: Secondary | ICD-10-CM | POA: Diagnosis not present

## 2017-09-09 DIAGNOSIS — M7061 Trochanteric bursitis, right hip: Secondary | ICD-10-CM | POA: Diagnosis not present

## 2017-09-15 DIAGNOSIS — M6281 Muscle weakness (generalized): Secondary | ICD-10-CM | POA: Diagnosis not present

## 2017-09-15 DIAGNOSIS — M545 Low back pain: Secondary | ICD-10-CM | POA: Diagnosis not present

## 2017-09-16 ENCOUNTER — Telehealth: Payer: Self-pay | Admitting: Obstetrics and Gynecology

## 2017-09-17 ENCOUNTER — Encounter: Payer: Self-pay | Admitting: Obstetrics and Gynecology

## 2017-09-17 ENCOUNTER — Ambulatory Visit (INDEPENDENT_AMBULATORY_CARE_PROVIDER_SITE_OTHER): Payer: Medicare Other | Admitting: Obstetrics and Gynecology

## 2017-09-17 VITALS — BP 128/71 | HR 63 | Ht 60.0 in | Wt 154.4 lb

## 2017-09-17 DIAGNOSIS — N952 Postmenopausal atrophic vaginitis: Secondary | ICD-10-CM

## 2017-09-17 DIAGNOSIS — N8110 Cystocele, unspecified: Secondary | ICD-10-CM | POA: Diagnosis not present

## 2017-09-17 DIAGNOSIS — Z4689 Encounter for fitting and adjustment of other specified devices: Secondary | ICD-10-CM | POA: Diagnosis not present

## 2017-09-17 DIAGNOSIS — N950 Postmenopausal bleeding: Secondary | ICD-10-CM | POA: Diagnosis not present

## 2017-09-17 DIAGNOSIS — M545 Low back pain: Secondary | ICD-10-CM | POA: Diagnosis not present

## 2017-09-17 DIAGNOSIS — M6281 Muscle weakness (generalized): Secondary | ICD-10-CM | POA: Diagnosis not present

## 2017-09-17 NOTE — Progress Notes (Signed)
    GYNECOLOGY PROGRESS NOTE  Subjective:    Patient ID: [name], female    DOB: 07-Oct-1935, 82 y.o.   MRN: [id number]  HPI  Patient is a 82 y.o. G2P2 female who presents for complaints of vaginal bleeding x 1 day.  She currently has a pessary in place. She notes that on last Friday she was lying in bed and had a gush of blood on her pantyliner.  She reports that the bleeding has since resolved, but just wanted to make sure that everything was ok. She denies abdominal pain, cramping, urinary or bowel disturbances. Also still notes occasional vaginal discharge, thin, watery, some days heavier than others. Requires using a pantyliner daily.    The following portions of the patient's history were reviewed and updated as appropriate: allergies, current medications, past family history, past medical history, past social history, past surgical history and problem list.  Review of Systems Pertinent items noted in HPI and remainder of comprehensive ROS otherwise negative.   Objective:   Blood pressure 128/71, pulse 63, height 5' (1.524 m), weight 154 lb 6.4 oz (70 kg).    General appearance: alert and no distress  Abdomen: soft, non-tender. No masses palpable or organomegaly. No suprapubic tenderness.  Pelvis: Vulva normal, no lesions. Urethra with moderate atrophy  Vagina mildly atrophic (somewhat improved since prior visit). The patient's Size 3 ring with support pessary was removed, and cleaned. Speculum examination revealed normal vaginal mucosa with area of small possible abrasion on left vaginal sidewalll with no lesions (although appears to be healing). Cervix with no visible lesions. Bimanual exam not performed.    Assessment:   PMB Pessary in situ Cystocele Vaginal atrophy   Plan:   - Small area of possible source of bleeding, however due to the heaviness of the bleeding described does not appear to have come from this area.  However, will get pelvic ultrasound to rule out  uterine bleeding.  - Pessare removed, cleaned and reinserted today.  - Continue to use Trimo-san gel monthly.   - Continue Premarin cream for vaginal atrophy, will decrease from 1 gram to 1/2 gram, and from 2-3 times weekly to weekly to help decrease vaginal discharge.  - To f/u 1-2 weeks after ultrasound performed.     Rubie Maid, MD [facility]

## 2017-09-18 ENCOUNTER — Encounter: Payer: Self-pay | Admitting: Obstetrics and Gynecology

## 2017-09-22 DIAGNOSIS — M6281 Muscle weakness (generalized): Secondary | ICD-10-CM | POA: Diagnosis not present

## 2017-09-22 DIAGNOSIS — M545 Low back pain: Secondary | ICD-10-CM | POA: Diagnosis not present

## 2017-09-24 DIAGNOSIS — M6281 Muscle weakness (generalized): Secondary | ICD-10-CM | POA: Diagnosis not present

## 2017-09-24 DIAGNOSIS — M545 Low back pain: Secondary | ICD-10-CM | POA: Diagnosis not present

## 2017-09-29 DIAGNOSIS — M6281 Muscle weakness (generalized): Secondary | ICD-10-CM | POA: Diagnosis not present

## 2017-09-29 DIAGNOSIS — M545 Low back pain: Secondary | ICD-10-CM | POA: Diagnosis not present

## 2017-10-01 ENCOUNTER — Ambulatory Visit (INDEPENDENT_AMBULATORY_CARE_PROVIDER_SITE_OTHER): Payer: Medicare Other

## 2017-10-01 DIAGNOSIS — N950 Postmenopausal bleeding: Secondary | ICD-10-CM | POA: Diagnosis not present

## 2017-10-01 DIAGNOSIS — J8410 Pulmonary fibrosis, unspecified: Secondary | ICD-10-CM | POA: Diagnosis not present

## 2017-10-02 DIAGNOSIS — M545 Low back pain: Secondary | ICD-10-CM | POA: Diagnosis not present

## 2017-10-02 DIAGNOSIS — M6281 Muscle weakness (generalized): Secondary | ICD-10-CM | POA: Diagnosis not present

## 2017-10-06 DIAGNOSIS — M6281 Muscle weakness (generalized): Secondary | ICD-10-CM | POA: Diagnosis not present

## 2017-10-06 DIAGNOSIS — M545 Low back pain: Secondary | ICD-10-CM | POA: Diagnosis not present

## 2017-10-07 ENCOUNTER — Telehealth: Payer: Self-pay | Admitting: *Deleted

## 2017-10-07 NOTE — Telephone Encounter (Signed)
Sent message about this to [facility] yesterday.

## 2017-10-07 NOTE — Telephone Encounter (Signed)
Patient called and is inquiring about her U/S results . Patient is requesting a call back. Her contact # is [phone]. Please advise. Thank you

## 2017-10-07 NOTE — Telephone Encounter (Signed)
Sent to [facility].  Did not see f/u appt after u/s.  Check out note from last visit states 1-2 weeks u/s and 8 week pessary check.

## 2017-10-08 ENCOUNTER — Encounter: Payer: Medicare Other | Admitting: Obstetrics and Gynecology

## 2017-10-08 DIAGNOSIS — M6281 Muscle weakness (generalized): Secondary | ICD-10-CM | POA: Diagnosis not present

## 2017-10-08 DIAGNOSIS — M545 Low back pain: Secondary | ICD-10-CM | POA: Diagnosis not present

## 2017-11-12 ENCOUNTER — Ambulatory Visit
Admission: RE | Admit: 2017-11-12 | Discharge: 2017-11-12 | Disposition: A | Discharge status: Home / Self Care | Payer: Medicare Other | Source: Ambulatory Visit | Attending: Physician Assistant | Admitting: Physician Assistant

## 2017-11-12 ENCOUNTER — Ambulatory Visit: Payer: Medicare Other | Admitting: Physician Assistant

## 2017-11-12 ENCOUNTER — Ambulatory Visit (INDEPENDENT_AMBULATORY_CARE_PROVIDER_SITE_OTHER): Payer: Medicare Other | Admitting: Physician Assistant

## 2017-11-12 ENCOUNTER — Encounter: Payer: Self-pay | Admitting: Physician Assistant

## 2017-11-12 ENCOUNTER — Encounter: Payer: Medicare Other | Admitting: Obstetrics and Gynecology

## 2017-11-12 VITALS — BP 116/76 | HR 60 | Temp 98.6°F | Resp 20 | Wt 156.0 lb

## 2017-11-12 DIAGNOSIS — M5030 Other cervical disc degeneration, unspecified cervical region: Secondary | ICD-10-CM | POA: Insufficient documentation

## 2017-11-12 DIAGNOSIS — M542 Cervicalgia: Secondary | ICD-10-CM

## 2017-11-12 DIAGNOSIS — I6523 Occlusion and stenosis of bilateral carotid arteries: Secondary | ICD-10-CM | POA: Diagnosis not present

## 2017-11-12 DIAGNOSIS — M2578 Osteophyte, vertebrae: Secondary | ICD-10-CM | POA: Insufficient documentation

## 2017-11-12 DIAGNOSIS — M8580 Other specified disorders of bone density and structure, unspecified site: Secondary | ICD-10-CM | POA: Diagnosis not present

## 2017-11-12 DIAGNOSIS — G44209 Tension-type headache, unspecified, not intractable: Secondary | ICD-10-CM

## 2017-11-12 MED ORDER — BACLOFEN 10 MG PO TABS: 10.0000 mg | ORAL_TABLET | Freq: Three times a day (TID) | ORAL | 0 refills | Status: DC

## 2017-11-12 MED ORDER — METHYLPREDNISOLONE 4 MG PO TBPK: ORAL_TABLET | ORAL | 0 refills | Status: DC

## 2017-11-12 NOTE — Patient Instructions (Signed)
Tension Headache A tension headache is pain, pressure, or aching that is felt over the front and sides of your head. These headaches can last from 30 minutes to several days. Follow these instructions at home: Managing pain  Take over-the-counter and prescription medicines only as told by your doctor.  Lie down in a dark, quiet room when you have a headache.  If directed, apply ice to your head and neck area: ? Put ice in a plastic bag. ? Place a towel between your skin and the bag. ? Leave the ice on for 20 minutes, 2-3 times per day.  Use a heating pad or a hot shower to apply heat to your head and neck area as told by your doctor. Eating and drinking  Eat meals on a regular schedule.  Do not drink a lot of alcohol.  Do not use a lot of caffeine, or stop using caffeine. General instructions  Keep all follow-up visits as told by your doctor. This is important.  Keep a journal to find out if certain things bring on headaches. For example, write down: ? What you eat and drink. ? How much sleep you get. ? Any change to your diet or medicines.  Try getting a massage, or doing other things that help you to relax.  Lessen stress.  Sit up straight. Do not tighten (tense) your muscles.  Do not use tobacco products. This includes cigarettes, chewing tobacco, or e-cigarettes. If you need help quitting, ask your doctor.  Exercise regularly as told by your doctor.  Get enough sleep. This may mean 7-9 hours of sleep. Contact a doctor if:  Your symptoms are not helped by medicine.  You have a headache that feels different from your usual headache.  You feel sick to your stomach (nauseous) or you throw up (vomit).  You have a fever. Get help right away if:  Your headache becomes very bad.  You keep throwing up.  You have a stiff neck.  You have trouble seeing.  You have trouble speaking.  You have pain in your eye or ear.  Your muscles are weak or you lose muscle  control.  You lose your balance or you have trouble walking.  You feel like you will pass out (faint) or you pass out.  You have confusion. This information is not intended to replace advice given to you by your health care provider. Make sure you discuss any questions you have with your health care provider. Document Released: 11/13/2009 Document Revised: 04/18/2016 Document Reviewed: 12/12/2014 Elsevier Interactive Patient Education  [employer].

## 2017-11-12 NOTE — Progress Notes (Signed)
Patient: [name] Female    DOB: 01-03-1936   82 y.o.   MRN: [id number] Visit Date: 11/12/2017  Today's Provider: Mar Daring, PA-C   Chief Complaint  Patient presents with  . Headache   Subjective:    Headache   This is a new problem. The current episode started 1 to 4 weeks ago (2 weeks ). The problem occurs constantly. The problem has been gradually worsening. The pain is located in the left unilateral region. The pain radiates to the left shoulder and left neck. The quality of the pain is described as aching, dull and shooting. The pain is moderate. Associated symptoms include neck pain. Pertinent negatives include no dizziness, fever, numbness or weakness.   Patient reports that her symptoms are slightly relieved with Tylenol. She reports that the pain is mainly on the left side of her head that radiates to the left side of her neck and down her shoulder.     No Known Allergies   Current Outpatient Medications:  .  aspirin 81 MG chewable tablet, Chew by mouth., Disp: , Rfl:  .  betamethasone valerate (VALISONE) 0.1 % cream, Apply 1 application topically daily. , Disp: , Rfl:  .  citalopram (CELEXA) 20 MG tablet, Take 1 tablet (20 mg total) by mouth daily., Disp: 90 tablet, Rfl: 3 .  Cyanocobalamin (B-12) 2500 MCG TABS, Take 2,500 mcg by mouth daily., Disp: , Rfl:  .  ESBRIET 267 MG TABS, Take 6 tablets by mouth daily. , Disp: , Rfl:  .  estradiol (ESTRACE VAGINAL) 0.1 MG/GM vaginal cream, Place 1 Applicatorful vaginally once a week. (1gram weekly), Disp: 42.5 g, Rfl: 2 .  HYDROmorphone (DILAUDID) 2 MG tablet, Take 2 mg by mouth 2 (two) times daily. , Disp: , Rfl:  .  nystatin ointment (MYCOSTATIN), Apply 1 application topically 2 (two) times daily., Disp: 30 g, Rfl: 0 .  OXYGEN, Inhale 2 L into the lungs as needed., Disp: , Rfl:  .  OXYQUINOLONE SULFATE VAGINAL (TRIMO-SAN) 0.025 % GEL, Place 1 Applicatorful vaginally every 14 (fourteen) days., Disp: 45 g,  Rfl: 1 .  zoledronic acid (RECLAST) 5 MG/100ML SOLN injection, Inject into the vein., Disp: , Rfl:  .  lisinopril (PRINIVIL,ZESTRIL) 5 MG tablet, Take 1 tablet (5 mg total) by mouth daily. (Patient not taking: Reported on 11/12/2017), Disp: 90 tablet, Rfl: 1 .  nystatin-triamcinolone ointment (MYCOLOG), Apply 1 application topically 2 (two) times daily. (Patient not taking: Reported on 09/17/2017), Disp: 30 g, Rfl: 0  Review of Systems  Constitutional: Negative for activity change, appetite change, chills, diaphoresis, fatigue, fever and unexpected weight change.  Cardiovascular: Negative for chest pain, palpitations and leg swelling.  Musculoskeletal: Positive for arthralgias, myalgias and neck pain.  Neurological: Positive for headaches. Negative for dizziness, tremors, syncope, weakness, light-headedness and numbness.  Psychiatric/Behavioral: Negative.     Social History   Tobacco Use  . Smoking status: Never Smoker  . Smokeless tobacco: Never Used  Substance Use Topics  . Alcohol use: No   Objective:   BP 116/76 (BP Location: Right Arm, Patient Position: Sitting, Cuff Size: Normal)   Pulse 60   Temp 98.6 F (37 C)   Resp 20   Wt 156 lb (70.8 kg)   SpO2 97%   BMI 30.47 kg/m    Physical Exam  Constitutional: She appears well-developed and well-nourished. No distress.  HENT:  Head: Normocephalic and atraumatic.  Right Ear: Hearing, tympanic membrane, external ear  and ear canal normal.  Left Ear: Hearing, tympanic membrane, external ear and ear canal normal.  Neck: Neck supple. Muscular tenderness (left upper trapezius) present. No spinous process tenderness present. Normal range of motion present.  Cardiovascular: Normal rate, regular rhythm and normal heart sounds. Exam reveals no gallop and no friction rub.  No murmur heard. Pulmonary/Chest: Effort normal and breath sounds normal. No respiratory distress. She has no wheezes. She has no rales.  Skin: She is not diaphoretic.   Vitals reviewed.      Assessment & Plan:     1. Neck pain on left side Suspect either radiating pain from her neck (has chronic LBP and on hydromorphone followed by Dr. Tacy Dura) or from instability and weakness of left shoulder (has had surgical procedures on) causing muscle spasm in left upper trapezius causing tension type headaches. Will try medrol dose pak as below for inflammation, baclofen for muscle spasm. Will get imaging of the neck to R/O bony source. Discussed PT but patient desires to wait to see if meds work first. This is reasonable. I will see her back if no improvements. She is scheduled to see Dr. Tacy Dura in April and I advised for her to let her know about these headaches as well.  - methylPREDNISolone (MEDROL) 4 MG TBPK tablet; 6 day taper; take as directed on package instructions  Dispense: 21 tablet; Refill: 0 - baclofen (LIORESAL) 10 MG tablet; Take 1 tablet (10 mg total) by mouth 3 (three) times daily.  Dispense: 30 each; Refill: 0 - DG Cervical Spine Complete; Future  2. Tension headache See above medical treatment plan. - methylPREDNISolone (MEDROL) 4 MG TBPK tablet; 6 day taper; take as directed on package instructions  Dispense: 21 tablet; Refill: 0 - baclofen (LIORESAL) 10 MG tablet; Take 1 tablet (10 mg total) by mouth 3 (three) times daily.  Dispense: 30 each; Refill: 0 - DG Cervical Spine Complete; Future       Mar Daring, PA-C  [facility]

## 2017-11-13 ENCOUNTER — Telehealth: Payer: Self-pay

## 2017-11-13 DIAGNOSIS — I6523 Occlusion and stenosis of bilateral carotid arteries: Secondary | ICD-10-CM

## 2017-11-13 NOTE — Telephone Encounter (Signed)
-----   Message from Mar Daring, PA-C sent at 11/13/2017  9:25 AM EDT ----- Diffuse degenerative changes with some narrowing near the spinal cord noted throughout the cervical spine. Would recommend you discuss this with Dr. Tacy Dura to make sure not causing the pain of the left side. I also still suspect left shoulder weakness as discussed as well. Also noted is some calcifications of the carotid arteries. Would recommend a carotid US for further evaluation. If agreeable I will order.

## 2017-11-13 NOTE — Telephone Encounter (Signed)
Left message to call back  

## 2017-11-14 ENCOUNTER — Telehealth: Payer: Self-pay

## 2017-11-14 NOTE — Telephone Encounter (Signed)
Carotid US ordered.

## 2017-11-14 NOTE — Telephone Encounter (Signed)
Patient advised proceed with ultrasound order. KW

## 2017-11-14 NOTE — Telephone Encounter (Signed)
Advised patient as below. Patient would like to proceed with the [place]. Thanks!

## 2017-11-14 NOTE — Telephone Encounter (Signed)
See other phone note. Already ordered.

## 2017-11-14 NOTE — Telephone Encounter (Signed)
[id number] 

## 2017-11-14 NOTE — Telephone Encounter (Signed)
-----   Message from Mar Daring, PA-C sent at 11/13/2017  9:25 AM EDT ----- Diffuse degenerative changes with some narrowing near the spinal cord noted throughout the cervical spine. Would recommend you discuss this with Dr. Tacy Dura to make sure not causing the pain of the left side. I also still suspect left shoulder weakness as discussed as well. Also noted is some calcifications of the carotid arteries. Would recommend a carotid US for further evaluation. If agreeable I will order.

## 2017-11-19 ENCOUNTER — Ambulatory Visit (INDEPENDENT_AMBULATORY_CARE_PROVIDER_SITE_OTHER): Payer: Medicare Other | Admitting: Obstetrics and Gynecology

## 2017-11-19 ENCOUNTER — Encounter: Payer: Self-pay | Admitting: Obstetrics and Gynecology

## 2017-11-19 VITALS — BP 106/67 | HR 56 | Ht 60.0 in | Wt 156.9 lb

## 2017-11-19 DIAGNOSIS — N952 Postmenopausal atrophic vaginitis: Secondary | ICD-10-CM | POA: Diagnosis not present

## 2017-11-19 DIAGNOSIS — N8110 Cystocele, unspecified: Secondary | ICD-10-CM

## 2017-11-19 DIAGNOSIS — I6523 Occlusion and stenosis of bilateral carotid arteries: Secondary | ICD-10-CM | POA: Diagnosis not present

## 2017-11-19 DIAGNOSIS — Z4689 Encounter for fitting and adjustment of other specified devices: Secondary | ICD-10-CM

## 2017-11-19 NOTE — Progress Notes (Signed)
    GYNECOLOGY PROGRESS NOTE  Subjective:    Patient ID: [name], female    DOB: July 18, 1936, 82 y.o.   MRN: [id number]  HPI  Patient is a 82 y.o. G2P2 female who presents for pessary check. She reports thick discharge but thinks it is due to the estrogen cream not being fully absorbed at night. Denies itching or burning.  She denies pelvic discomfort and difficulty urinating or moving her bowels.   ? The following portions of the patient's history were reviewed and updated as appropriate: allergies, current medications, past family history, past medical history, past social history, past surgical history and problem list.  Review of Systems Pertinent items noted in HPI and remainder of comprehensive ROS otherwise negative.   Objective:   Blood pressure 106/67, pulse (!) 56, height 5' (1.524 m), weight 156 lb 14.4 oz (71.2 kg).    General appearance: alert and no distress  Abdomen: soft, non-tender. No masses palpable or organomegaly. No suprapubic tenderness.  Back: No CVA tenderness.  Pelvis: The patient's Size 3 ring with support pessary was removed, and cleaned. Speculum examination revealed normal vaginal mucosa with no lesions. Vagina with mild atrophy (however improved since last visit). Vaginal cream noted to be present in vault. Thin white discharge also present.    Assessment:   Pessary in situ Cystocele Vaginal atrophy   Plan:   - Continue to use Trimo-san gel monthly.   - Continue Premarin cream for vaginal atrophy, decrease use to once weekly - To f/u in 10-12 weeks for pessary check.     Rubie Maid, MD [facility]

## 2017-11-19 NOTE — Progress Notes (Signed)
Pt stated that the pessary is helping.

## 2017-11-20 NOTE — Telephone Encounter (Signed)
Error

## 2017-11-21 ENCOUNTER — Telehealth: Payer: Self-pay

## 2017-11-21 ENCOUNTER — Ambulatory Visit
Admission: RE | Admit: 2017-11-21 | Discharge: 2017-11-21 | Disposition: A | Discharge status: Home / Self Care | Payer: Medicare Other | Source: Ambulatory Visit | Attending: Physician Assistant | Admitting: Physician Assistant

## 2017-11-21 DIAGNOSIS — I6523 Occlusion and stenosis of bilateral carotid arteries: Secondary | ICD-10-CM

## 2017-11-21 NOTE — Telephone Encounter (Signed)
Pt advised.

## 2017-11-21 NOTE — Telephone Encounter (Signed)
-----   Message from Mar Daring, [place] sent at 11/21/2017  2:47 PM EDT ----- Minimal narrowing noted of the right and left carotid arteries all less than 50%.

## 2017-12-03 DIAGNOSIS — J849 Interstitial pulmonary disease, unspecified: Secondary | ICD-10-CM | POA: Diagnosis not present

## 2017-12-03 DIAGNOSIS — R0602 Shortness of breath: Secondary | ICD-10-CM | POA: Diagnosis not present

## 2017-12-03 DIAGNOSIS — Z79899 Other long term (current) drug therapy: Secondary | ICD-10-CM | POA: Diagnosis not present

## 2017-12-09 ENCOUNTER — Encounter: Payer: Self-pay | Admitting: Physician Assistant

## 2017-12-09 ENCOUNTER — Ambulatory Visit (INDEPENDENT_AMBULATORY_CARE_PROVIDER_SITE_OTHER): Payer: Medicare Other | Admitting: Physician Assistant

## 2017-12-09 VITALS — BP 126/68 | HR 54 | Temp 99.2°F | Resp 20 | Wt 158.0 lb

## 2017-12-09 DIAGNOSIS — J8410 Pulmonary fibrosis, unspecified: Secondary | ICD-10-CM

## 2017-12-09 DIAGNOSIS — I6523 Occlusion and stenosis of bilateral carotid arteries: Secondary | ICD-10-CM | POA: Diagnosis not present

## 2017-12-09 MED ORDER — AMOXICILLIN-POT CLAVULANATE 875-125 MG PO TABS: 1.0000 | ORAL_TABLET | Freq: Two times a day (BID) | ORAL | 0 refills | Status: DC

## 2017-12-09 MED ORDER — BENZONATATE 200 MG PO CAPS: 200.0000 mg | ORAL_CAPSULE | Freq: Three times a day (TID) | ORAL | 0 refills | Status: DC | PRN

## 2017-12-09 MED ORDER — PREDNISONE 10 MG (21) PO TBPK: ORAL_TABLET | ORAL | 0 refills | Status: DC

## 2017-12-09 NOTE — Progress Notes (Signed)
Patient: [name] Female    DOB: Aug 21, 1936   82 y.o.   MRN: [id number] Visit Date: 12/09/2017  Today's Provider: Mar Daring, PA-C   Chief Complaint  Patient presents with  . Cough   Subjective:    Cough  This is a new problem. The current episode started in the past 7 days (4 days). The problem has been gradually worsening. The problem occurs constantly. The cough is productive of sputum. Associated symptoms include myalgias and wheezing. Pertinent negatives include no headaches or shortness of breath. She has tried OTC cough suppressant for the symptoms. The treatment provided no relief.      No Known Allergies   Current Outpatient Medications:  .  aspirin 81 MG chewable tablet, Chew by mouth., Disp: , Rfl:  .  baclofen (LIORESAL) 10 MG tablet, Take 1 tablet (10 mg total) by mouth 3 (three) times daily., Disp: 30 each, Rfl: 0 .  betamethasone valerate (VALISONE) 0.1 % cream, Apply 1 application topically daily. , Disp: , Rfl:  .  citalopram (CELEXA) 20 MG tablet, Take 1 tablet (20 mg total) by mouth daily., Disp: 90 tablet, Rfl: 3 .  Cyanocobalamin (B-12) 2500 MCG TABS, Take 2,500 mcg by mouth daily., Disp: , Rfl:  .  ESBRIET 267 MG TABS, Take 6 tablets by mouth daily. , Disp: , Rfl:  .  estradiol (ESTRACE VAGINAL) 0.1 MG/GM vaginal cream, Place 1 Applicatorful vaginally once a week. (1gram weekly), Disp: 42.5 g, Rfl: 2 .  HYDROmorphone (DILAUDID) 2 MG tablet, Take 2 mg by mouth 2 (two) times daily. , Disp: , Rfl:  .  nystatin ointment (MYCOSTATIN), Apply 1 application topically 2 (two) times daily., Disp: 30 g, Rfl: 0 .  OXYGEN, Inhale 2 L into the lungs as needed., Disp: , Rfl:  .  OXYQUINOLONE SULFATE VAGINAL (TRIMO-SAN) 0.025 % GEL, Place 1 Applicatorful vaginally every 14 (fourteen) days., Disp: 45 g, Rfl: 1 .  zoledronic acid (RECLAST) 5 MG/100ML SOLN injection, Inject into the vein., Disp: , Rfl:   Review of Systems  Constitutional: Negative.     Respiratory: Positive for cough and wheezing. Negative for chest tightness and shortness of breath.   Cardiovascular: Negative.   Gastrointestinal: Negative.   Musculoskeletal: Positive for myalgias.  Neurological: Negative for dizziness, light-headedness and headaches.    Social History   Tobacco Use  . Smoking status: Never Smoker  . Smokeless tobacco: Never Used  Substance Use Topics  . Alcohol use: No   Objective:   BP 126/68 (BP Location: Right Arm, Patient Position: Sitting, Cuff Size: Normal)   Pulse (!) 54   Temp 99.2 F (37.3 C)   Resp 20   Wt 158 lb (71.7 kg)   SpO2 94%   BMI 30.86 kg/m  Vitals:   12/09/17 1121  BP: 126/68  Pulse: (!) 54  Resp: 20  Temp: 99.2 F (37.3 C)  SpO2: 94%  Weight: 158 lb (71.7 kg)     Physical Exam  Constitutional: She appears well-developed and well-nourished. No distress.  HENT:  Head: Normocephalic and atraumatic.  Right Ear: Hearing, tympanic membrane, external ear and ear canal normal.  Left Ear: Hearing, tympanic membrane, external ear and ear canal normal.  Nose: Nose normal.  Mouth/Throat: Uvula is midline, oropharynx is clear and moist and mucous membranes are normal. No oropharyngeal exudate.  Eyes: Pupils are equal, round, and reactive to light. Conjunctivae are normal. Right eye exhibits no discharge. Left eye exhibits  no discharge. No scleral icterus.  Neck: Normal range of motion. Neck supple. No tracheal deviation present. No thyromegaly present.  Cardiovascular: Normal rate, regular rhythm and normal heart sounds. Exam reveals no gallop and no friction rub.  No murmur heard. Pulmonary/Chest: Effort normal. No stridor. No respiratory distress. She has decreased breath sounds. She has no wheezes. She has no rales.  Lymphadenopathy:    She has no cervical adenopathy.  Skin: Skin is warm and dry. She is not diaphoretic.  Vitals reviewed.       Assessment & Plan:     1. Postinflammatory pulmonary fibrosis  (HCC) Worsening symptoms that have not responded to OTC medications. Will give augmentin, prednisone taper and tessalon perles as below. Continue allergy medications. Stay well hydrated and get plenty of rest. Call if no symptom improvement or if symptoms worsen. - amoxicillin-clavulanate (AUGMENTIN) 875-125 MG tablet; Take 1 tablet by mouth 2 (two) times daily.  Dispense: 20 tablet; Refill: 0 - predniSONE (STERAPRED UNI-PAK 21 TAB) 10 MG (21) TBPK tablet; 6 day taper; take as directed on package instructions  Dispense: 21 tablet; Refill: 0 - benzonatate (TESSALON) 200 MG capsule; Take 1 capsule (200 mg total) by mouth 3 (three) times daily as needed for cough.  Dispense: 30 capsule; Refill: 0       Mar Daring, PA-C  [facility]

## 2017-12-09 NOTE — Patient Instructions (Addendum)
Tessalon perles - Benzonatate 252m  Augmentin for infection  Prednisone 6 day taper  Pulmonary Fibrosis Pulmonary fibrosis is a type of lung disease that causes scarring. Over time, the scar tissue (fibrosis) builds up in the air sacs of your lungs. This makes it hard for you to breathe. Less oxygen can get into your blood. Scarring from pulmonary fibrosis gets worse over time. This damage is permanent. Having damaged lungs may make it more likely that you will have heart problems as well. What are the causes? Usually, the cause of pulmonary fibrosis is not known (idiopathic pulmonary fibrosis). However, pulmonary fibrosis can be caused by:  Exposure to occupational and environmental toxins. These include asbestos, silica, and metal dusts.  Inhaling moldy hay. This can cause an allergic reaction in the lung (farmer's lung) that can lead to pulmonary fibrosis.  Inhaling toxic fumes.  Certain medicines. These include drugs used in radiation therapy or used to treat seizures, heart problems, and some infections.  Autoimmune diseases, such as rheumatoid arthritis, systemic sclerosis, and connective tissue diseases.  Sarcoidosis. In this disease, areas of inflammatory cells (granulomas) form and most often affect the lungs.  Genes. Some cases of pulmonary fibrosis may be passed down through families.  What increases the risk? You may be at a higher risk for developing pulmonary fibrosis if:  You have a family history of the disease.  You have an autoimmune disease or another condition linked to pulmonary fibrosis.  You are exposed to certain substances or fumes found in agricultural, farm, cArchitect or factory work.  You take certain medicines.  What are the signs or symptoms? Symptoms may include:  Difficulty breathing that gets worse with activity.  Dry, hacking cough.  Rapid, shallow breathing during exercise or while at rest.  Shortness of breath that gets worse  (dyspnea).  Bluish skin and lips.  Loss of appetite.  Loss of strength.  Weight loss and fatigue.  Rounded and enlarged fingertips (clubbing).  How is this diagnosed? Your health care provider may suspect pulmonary fibrosis based on your symptoms and medical history. Diagnosis may include a physical exam. Your health care provider will check for signs that strongly suggest that you have pulmonary fibrosis, such as:  Blue skin around your fingernails or mouth from reduced oxygen.  Clubbing around the ends of your fingers.  A crackling sound when you breathe.  Your health care provider may also do tests to confirm the diagnosis. These may include:  Looking inside your lungs with an instrument (bronchoscopy).  Imaging studies of your lungs and heart using: ? X-rays. ? CT scan. ? Sound waves (echocardiogram).  Tests to measure how well you are breathing (pulmonary function tests).  Exercise testing to see how well your lungs work while you are walking.  Blood tests.  A procedure to remove a small piece of lung tissue to examine in a lab (biopsy).  How is this treated? There is no cure for pulmonary fibrosis. Treatments focus on managing symptoms and preventing scarring from getting worse. This can include:  Medicines. ? You may take steroids to prevent permanent lung changes. Your health care provider may put you on a high dose at first, then on lower dosages for the long term. ? Medicines to suppress your body's defense (immune) system. These can have serious side effects.  You may be monitored with X-rays and laboratory work.  Oxygen therapy may be helpful if oxygen in your blood is low.  Surgery. In some cases, a lung  transplant is an option.  Follow these instructions at home:  Take medicines only as directed by your health care provider.  Keep your vaccinations up to date as recommended by your health care provider.  Do not use any tobacco products, including  cigarettes, chewing tobacco, or electronic cigarettes. If you need help quitting, ask your health care provider.  Get regular exercise, but do not overexert yourself. Ask your health care provider to suggest some activities that are safe for you to do. Walking and chair exercises can help if you have physical limitations.  Consider joining a pulmonary rehabilitation program or a support group for people with pulmonary fibrosis.  Eat small meals often so you do not get too full. Overeating can make breathing trouble worse.  Maintain a healthy weight. Lose weight if you need to.  Do breathing exercises as directed by your health care provider.  Keep all follow-up visits as directed by your health care provider. This is important. Contact a health care provider if:  You are not able to be as active as usual.  You have a long-lasting (chronic) cough.  You are often short of breath.  You have a fever or chills. Get help right away if:  You have chest pain.  Your breathing is much worse.  You cannot take a deep breath.  You have blue skin around your mouth or fingers.  You have clubbing of your fingers.  You cough up mucus that is dark in color.  You have a lot of headaches.  You get very confused or sleepy. This information is not intended to replace advice given to you by your health care provider. Make sure you discuss any questions you have with your health care provider. Document Released: 11/09/2003 Document Revised: 01/25/2016 Document Reviewed: 01/27/2014 Elsevier Interactive Patient Education  2018 [employer].

## 2017-12-16 DIAGNOSIS — M5137 Other intervertebral disc degeneration, lumbosacral region: Secondary | ICD-10-CM | POA: Diagnosis not present

## 2017-12-16 DIAGNOSIS — J209 Acute bronchitis, unspecified: Secondary | ICD-10-CM | POA: Diagnosis not present

## 2017-12-16 DIAGNOSIS — M47896 Other spondylosis, lumbar region: Secondary | ICD-10-CM | POA: Diagnosis not present

## 2017-12-16 DIAGNOSIS — M5126 Other intervertebral disc displacement, lumbar region: Secondary | ICD-10-CM | POA: Diagnosis not present

## 2017-12-16 DIAGNOSIS — Z79891 Long term (current) use of opiate analgesic: Secondary | ICD-10-CM | POA: Diagnosis not present

## 2017-12-16 DIAGNOSIS — M7061 Trochanteric bursitis, right hip: Secondary | ICD-10-CM | POA: Diagnosis not present

## 2017-12-16 DIAGNOSIS — M549 Dorsalgia, unspecified: Secondary | ICD-10-CM | POA: Diagnosis not present

## 2017-12-16 DIAGNOSIS — M461 Sacroiliitis, not elsewhere classified: Secondary | ICD-10-CM | POA: Diagnosis not present

## 2017-12-16 DIAGNOSIS — M545 Low back pain: Secondary | ICD-10-CM | POA: Diagnosis not present

## 2017-12-16 DIAGNOSIS — Z79899 Other long term (current) drug therapy: Secondary | ICD-10-CM | POA: Diagnosis not present

## 2017-12-16 DIAGNOSIS — M5116 Intervertebral disc disorders with radiculopathy, lumbar region: Secondary | ICD-10-CM | POA: Diagnosis not present

## 2017-12-16 DIAGNOSIS — M48061 Spinal stenosis, lumbar region without neurogenic claudication: Secondary | ICD-10-CM | POA: Diagnosis not present

## 2017-12-16 DIAGNOSIS — K5909 Other constipation: Secondary | ICD-10-CM | POA: Diagnosis not present

## 2017-12-18 ENCOUNTER — Telehealth: Payer: Self-pay | Admitting: Physician Assistant

## 2017-12-18 DIAGNOSIS — J8410 Pulmonary fibrosis, unspecified: Secondary | ICD-10-CM

## 2017-12-18 NOTE — Telephone Encounter (Signed)
Patient states that she is still coughing and it is making her weak.  She would like to know if there is anything else that you can do like blood work to see if there is anything else going on.  She would also like a refill on benzonatate (TESSALON) 200 MG capsule.  She would like this sent to [facility] on [address].

## 2017-12-22 MED ORDER — BENZONATATE 200 MG PO CAPS: 200.0000 mg | ORAL_CAPSULE | Freq: Three times a day (TID) | ORAL | 0 refills | Status: DC | PRN

## 2017-12-22 NOTE — Telephone Encounter (Signed)
Tessalon perles refilled and CXR ordered. She can go to [facility] at her convenience for this. I will call with results once received.

## 2017-12-22 NOTE — Telephone Encounter (Signed)
Left message to call back  

## 2017-12-22 NOTE — Telephone Encounter (Signed)
Just saw this message. Please review. Thanks!

## 2017-12-22 NOTE — Telephone Encounter (Signed)
Advised patient as below.  

## 2017-12-23 DIAGNOSIS — K5909 Other constipation: Secondary | ICD-10-CM | POA: Diagnosis not present

## 2017-12-23 DIAGNOSIS — M19012 Primary osteoarthritis, left shoulder: Secondary | ICD-10-CM | POA: Diagnosis not present

## 2017-12-23 DIAGNOSIS — M7061 Trochanteric bursitis, right hip: Secondary | ICD-10-CM | POA: Diagnosis not present

## 2017-12-23 DIAGNOSIS — M5116 Intervertebral disc disorders with radiculopathy, lumbar region: Secondary | ICD-10-CM | POA: Diagnosis not present

## 2017-12-23 DIAGNOSIS — M5126 Other intervertebral disc displacement, lumbar region: Secondary | ICD-10-CM | POA: Diagnosis not present

## 2017-12-23 DIAGNOSIS — M5137 Other intervertebral disc degeneration, lumbosacral region: Secondary | ICD-10-CM | POA: Diagnosis not present

## 2017-12-23 DIAGNOSIS — Z79899 Other long term (current) drug therapy: Secondary | ICD-10-CM | POA: Diagnosis not present

## 2017-12-23 DIAGNOSIS — M545 Low back pain: Secondary | ICD-10-CM | POA: Diagnosis not present

## 2017-12-23 DIAGNOSIS — M47896 Other spondylosis, lumbar region: Secondary | ICD-10-CM | POA: Diagnosis not present

## 2017-12-23 DIAGNOSIS — M549 Dorsalgia, unspecified: Secondary | ICD-10-CM | POA: Diagnosis not present

## 2017-12-23 DIAGNOSIS — M48061 Spinal stenosis, lumbar region without neurogenic claudication: Secondary | ICD-10-CM | POA: Diagnosis not present

## 2017-12-23 DIAGNOSIS — M461 Sacroiliitis, not elsewhere classified: Secondary | ICD-10-CM | POA: Diagnosis not present

## 2018-01-13 DIAGNOSIS — D692 Other nonthrombocytopenic purpura: Secondary | ICD-10-CM | POA: Diagnosis not present

## 2018-01-13 DIAGNOSIS — L821 Other seborrheic keratosis: Secondary | ICD-10-CM | POA: Diagnosis not present

## 2018-01-13 DIAGNOSIS — Z85828 Personal history of other malignant neoplasm of skin: Secondary | ICD-10-CM | POA: Diagnosis not present

## 2018-01-13 DIAGNOSIS — C44329 Squamous cell carcinoma of skin of other parts of face: Secondary | ICD-10-CM | POA: Diagnosis not present

## 2018-01-13 DIAGNOSIS — L820 Inflamed seborrheic keratosis: Secondary | ICD-10-CM | POA: Diagnosis not present

## 2018-01-15 DIAGNOSIS — E6609 Other obesity due to excess calories: Secondary | ICD-10-CM | POA: Diagnosis not present

## 2018-01-15 DIAGNOSIS — M5136 Other intervertebral disc degeneration, lumbar region: Secondary | ICD-10-CM | POA: Diagnosis not present

## 2018-01-15 DIAGNOSIS — M48061 Spinal stenosis, lumbar region without neurogenic claudication: Secondary | ICD-10-CM | POA: Diagnosis not present

## 2018-02-03 ENCOUNTER — Ambulatory Visit (INDEPENDENT_AMBULATORY_CARE_PROVIDER_SITE_OTHER): Payer: Medicare Other | Admitting: Physician Assistant

## 2018-02-03 ENCOUNTER — Encounter: Payer: Self-pay | Admitting: Physician Assistant

## 2018-02-03 ENCOUNTER — Telehealth: Payer: Self-pay

## 2018-02-03 VITALS — BP 120/70 | HR 63 | Temp 97.9°F | Resp 16 | Wt 153.4 lb

## 2018-02-03 DIAGNOSIS — I6523 Occlusion and stenosis of bilateral carotid arteries: Secondary | ICD-10-CM | POA: Diagnosis not present

## 2018-02-03 DIAGNOSIS — J8410 Pulmonary fibrosis, unspecified: Secondary | ICD-10-CM | POA: Diagnosis not present

## 2018-02-03 DIAGNOSIS — S39011A Strain of muscle, fascia and tendon of abdomen, initial encounter: Secondary | ICD-10-CM

## 2018-02-03 DIAGNOSIS — M62838 Other muscle spasm: Secondary | ICD-10-CM

## 2018-02-03 MED ORDER — BACLOFEN 10 MG PO TABS: 10.0000 mg | ORAL_TABLET | Freq: Three times a day (TID) | ORAL | 0 refills | Status: DC

## 2018-02-03 MED ORDER — METHYLPREDNISOLONE 4 MG PO TBPK: ORAL_TABLET | ORAL | 0 refills | Status: DC

## 2018-02-03 NOTE — Progress Notes (Signed)
Patient: [name] Female    DOB: 10-Oct-1935   82 y.o.   MRN: [id number] Visit Date: 02/03/2018  Today's Provider: Mar Daring, PA-C   Chief Complaint  Patient presents with  . Muscle Pain   Subjective:    HPI  Patient here today with c/o possible pulled muscle, right side. She reports she was pulling weeds and felt like something was pulled. This happened a week ago and has not improved with rest and heat. Now reports that if she bends over or cough really hard it hurts. Treatments tried: none.     No Known Allergies   Current Outpatient Medications:  .  betamethasone valerate (VALISONE) 0.1 % cream, Apply 1 application topically daily. , Disp: , Rfl:  .  citalopram (CELEXA) 20 MG tablet, Take 1 tablet (20 mg total) by mouth daily., Disp: 90 tablet, Rfl: 3 .  Cyanocobalamin (B-12) 2500 MCG TABS, Take 2,500 mcg by mouth daily., Disp: , Rfl:  .  ESBRIET 267 MG TABS, Take 6 tablets by mouth daily. , Disp: , Rfl:  .  estradiol (ESTRACE VAGINAL) 0.1 MG/GM vaginal cream, Place 1 Applicatorful vaginally once a week. (1gram weekly), Disp: 42.5 g, Rfl: 2 .  HYDROmorphone (DILAUDID) 2 MG tablet, Take 2 mg by mouth 2 (two) times daily. , Disp: , Rfl:  .  nystatin ointment (MYCOSTATIN), Apply 1 application topically 2 (two) times daily., Disp: 30 g, Rfl: 0 .  OXYGEN, Inhale 2 L into the lungs as needed., Disp: , Rfl:  .  OXYQUINOLONE SULFATE VAGINAL (TRIMO-SAN) 0.025 % GEL, Place 1 Applicatorful vaginally every 14 (fourteen) days., Disp: 45 g, Rfl: 1 .  zoledronic acid (RECLAST) 5 MG/100ML SOLN injection, Inject into the vein., Disp: , Rfl:  .  amoxicillin-clavulanate (AUGMENTIN) 875-125 MG tablet, Take 1 tablet by mouth 2 (two) times daily. (Patient not taking: Reported on 02/03/2018), Disp: 20 tablet, Rfl: 0 .  aspirin 81 MG chewable tablet, Chew by mouth., Disp: , Rfl:  .  baclofen (LIORESAL) 10 MG tablet, Take 1 tablet (10 mg total) by mouth 3 (three) times daily.  (Patient not taking: Reported on 02/03/2018), Disp: 30 each, Rfl: 0 .  benzonatate (TESSALON) 200 MG capsule, Take 1 capsule (200 mg total) by mouth 3 (three) times daily as needed for cough. (Patient not taking: Reported on 02/03/2018), Disp: 30 capsule, Rfl: 0 .  predniSONE (STERAPRED UNI-PAK 21 TAB) 10 MG (21) TBPK tablet, 6 day taper; take as directed on package instructions (Patient not taking: Reported on 02/03/2018), Disp: 21 tablet, Rfl: 0  Review of Systems  Constitutional: Negative.   Respiratory: Negative for cough, chest tightness, shortness of breath and wheezing.   Cardiovascular: Negative for chest pain, palpitations and leg swelling.  Musculoskeletal: Positive for myalgias.  Neurological: Negative.     Social History   Tobacco Use  . Smoking status: Never Smoker  . Smokeless tobacco: Never Used  Substance Use Topics  . Alcohol use: No   Objective:   BP 120/70 (BP Location: Left Arm, Patient Position: Sitting, Cuff Size: Normal)   Pulse 63   Temp 97.9 F (36.6 C) (Oral)   Resp 16   Wt 153 lb 6.4 oz (69.6 kg)   BMI 29.96 kg/m    Physical Exam  Constitutional: She appears well-developed and well-nourished. No distress.  Neck: Normal range of motion. Neck supple.  Cardiovascular: Normal rate, regular rhythm and normal heart sounds. Exam reveals no gallop and no friction  rub.  No murmur heard. Pulmonary/Chest: Effort normal and breath sounds normal. No respiratory distress. She has no wheezes. She has no rales. She exhibits tenderness.    Skin: She is not diaphoretic.  Vitals reviewed.       Assessment & Plan:     1. Muscle spasm Suspect muscle spasms and strain of the external obliques on the right flank. Will give medrol dose paka nd baclofen as below. May use moist heating pad and instructed on deep breathing exercises. Call if symptoms worsen. - methylPREDNISolone (MEDROL) 4 MG TBPK tablet; 6 day taper; take as directed on package instructions  Dispense: 21  tablet; Refill: 0 - baclofen (LIORESAL) 10 MG tablet; Take 1 tablet (10 mg total) by mouth 3 (three) times daily.  Dispense: 30 each; Refill: 0  2. Strain of muscle, fascia and tendon of abdomen, initial encounter See above medical treatment plan. - methylPREDNISolone (MEDROL) 4 MG TBPK tablet; 6 day taper; take as directed on package instructions  Dispense: 21 tablet; Refill: 0 - baclofen (LIORESAL) 10 MG tablet; Take 1 tablet (10 mg total) by mouth 3 (three) times daily.  Dispense: 30 each; Refill: 0  3. Postinflammatory pulmonary fibrosis ([place])       Mar Daring, PA-C  [facility]

## 2018-02-03 NOTE — Patient Instructions (Signed)
Costochondritis Costochondritis is swelling and irritation (inflammation) of the tissue (cartilage) that connects your ribs to your breastbone (sternum). This causes pain in the front of your chest. The pain usually starts gradually and involves more than one rib. What are the causes? The exact cause of this condition is not always known. It results from stress on the cartilage where your ribs attach to your sternum. The cause of this stress could be:  Chest injury (trauma).  Exercise or activity, such as lifting.  Severe coughing.  What increases the risk? You may be at higher risk for this condition if you:  Are female.  Are 80?80 years old.  Recently started a new exercise or work activity.  Have low levels of vitamin D.  Have a condition that makes you cough frequently.  What are the signs or symptoms? The main symptom of this condition is chest pain. The pain:  Usually starts gradually and can be sharp or dull.  Gets worse with deep breathing, coughing, or exercise.  Gets better with rest.  May be worse when you press on the sternum-rib connection (tenderness).  How is this diagnosed? This condition is diagnosed based on your symptoms, medical history, and a physical exam. Your health care provider will check for tenderness when pressing on your sternum. This is the most important finding. You may also have tests to rule out other causes of chest pain. These may include:  A chest X-ray to check for lung problems.  An electrocardiogram (ECG) to see if you have a heart problem that could be causing the pain.  An imaging scan to rule out a chest or rib fracture.  How is this treated? This condition usually goes away on its own over time. Your health care provider may prescribe an NSAID to reduce pain and inflammation. Your health care provider may also suggest that you:  Rest and avoid activities that make pain worse.  Apply heat or cold to the area to reduce pain  and inflammation.  Do exercises to stretch your chest muscles.  If these treatments do not help, your health care provider may inject a numbing medicine at the sternum-rib connection to help relieve the pain. Follow these instructions at home:  Avoid activities that make pain worse. This includes any activities that use chest, abdominal, and side muscles.  If directed, put ice on the painful area: ? Put ice in a plastic bag. ? Place a towel between your skin and the bag. ? Leave the ice on for 20 minutes, 2-3 times a day.  If directed, apply heat to the affected area as often as told by your health care provider. Use the heat source that your health care provider recommends, such as a moist heat pack or a heating pad. ? Place a towel between your skin and the heat source. ? Leave the heat on for 20-30 minutes. ? Remove the heat if your skin turns bright red. This is especially important if you are unable to feel pain, heat, or cold. You may have a greater risk of getting burned.  Take over-the-counter and prescription medicines only as told by your health care provider.  Return to your normal activities as told by your health care provider. Ask your health care provider what activities are safe for you.  Keep all follow-up visits as told by your health care provider. This is important. Contact a health care provider if:  You have chills or a fever.  Your pain does not go  away or it gets worse.  You have a cough that does not go away (is persistent). Get help right away if:  You have shortness of breath. This information is not intended to replace advice given to you by your health care provider. Make sure you discuss any questions you have with your health care provider. Document Released: 05/29/2005 Document Revised: 03/08/2016 Document Reviewed: 12/13/2015 Elsevier Interactive Patient Education  [employer].

## 2018-02-03 NOTE — Telephone Encounter (Signed)
Patient called saying that she could not get the prednisone refilled because the pharmacist told her "it is not time." Is there a certain amount she can have a year? Please advise. Thanks!

## 2018-02-04 NOTE — Telephone Encounter (Signed)
Spoke with patient and she gave me the name of the medicine methylprenisolone and reports that the pharmacy still told her that she couldn't get it.  Thanks,  -Joseline

## 2018-02-04 NOTE — Telephone Encounter (Signed)
Pt's husband stated that we called pt on his number and stated pt can be reached at [phone]. I update the number in pt's contacts. Please advise. Thanks TNP

## 2018-02-04 NOTE — Telephone Encounter (Signed)
I didn't send in prednisone. I sent in medrol dose pak. She should be able to pick this up.

## 2018-02-04 NOTE — Telephone Encounter (Signed)
[place] and per the pharmacy medication was sent yesterday to [facility] and it looks like it was filled at [facility] and that's the reason she was told today it was too soon to be filled. [place] and per [place] medication is ready.  Thanks, -Joseline

## 2018-02-04 NOTE — Telephone Encounter (Signed)
Patient advised as directed below.That prescription is ready for pick up.  Thanks,  -Annielee Jemmott

## 2018-02-04 NOTE — Telephone Encounter (Signed)
LMTCB

## 2018-02-11 ENCOUNTER — Ambulatory Visit (INDEPENDENT_AMBULATORY_CARE_PROVIDER_SITE_OTHER): Payer: Medicare Other | Admitting: Obstetrics and Gynecology

## 2018-02-11 ENCOUNTER — Encounter: Payer: Self-pay | Admitting: Obstetrics and Gynecology

## 2018-02-11 VITALS — BP 136/78 | HR 85 | Ht 60.0 in | Wt 151.3 lb

## 2018-02-11 DIAGNOSIS — N8110 Cystocele, unspecified: Secondary | ICD-10-CM | POA: Diagnosis not present

## 2018-02-11 DIAGNOSIS — N952 Postmenopausal atrophic vaginitis: Secondary | ICD-10-CM

## 2018-02-11 DIAGNOSIS — Z4689 Encounter for fitting and adjustment of other specified devices: Secondary | ICD-10-CM

## 2018-02-11 DIAGNOSIS — I6523 Occlusion and stenosis of bilateral carotid arteries: Secondary | ICD-10-CM

## 2018-02-11 NOTE — Progress Notes (Signed)
    GYNECOLOGY PROGRESS NOTE  Subjective:    Patient ID: [name], female    DOB: 28-May-1936, 82 y.o.   MRN: [id number]  HPI  Patient is a 82 y.o. G2P2 female who presents for pessary check. She denies major complaints today. Denies itching or burning.  She denies pelvic discomfort and difficulty urinating or moving her bowels.   ? The following portions of the patient's history were reviewed and updated as appropriate: allergies, current medications, past family history, past medical history, past social history, past surgical history and problem list.  Review of Systems Pertinent items noted in HPI and remainder of comprehensive ROS otherwise negative.   Objective:   Blood pressure 136/78, pulse 85, height 5' (1.524 m), weight 151 lb 4.8 oz (68.6 kg).    General appearance: alert and no distress  Abdomen: soft, non-tender. No masses palpable or organomegaly. No suprapubic tenderness.  Back: No CVA tenderness.  Pelvis: The patient's Size 3 ring with support pessary was removed, and cleaned. Speculum examination revealed normal vaginal mucosa with no lesions. Thin white-yellow discharge also present.    Assessment:   Pessary in situ Cystocele Vaginal atrophy   Plan:   - Continue to use Trimo-san gel monthly.   - Continue Premarin cream for vaginal atrophy, decrease use to once weekly. Can decrease now to 0.5 mg instead of 1 mg.  - To f/u in 10-12 weeks for pessary check.     Rubie Maid, MD [facility]

## 2018-02-11 NOTE — Progress Notes (Signed)
Pt stated that the pessary is working well for her. Pt stated that she is still having some discharge and itching no other concerns.

## 2018-02-26 DIAGNOSIS — M47896 Other spondylosis, lumbar region: Secondary | ICD-10-CM | POA: Diagnosis not present

## 2018-02-26 DIAGNOSIS — Z79899 Other long term (current) drug therapy: Secondary | ICD-10-CM | POA: Diagnosis not present

## 2018-02-26 DIAGNOSIS — M549 Dorsalgia, unspecified: Secondary | ICD-10-CM | POA: Diagnosis not present

## 2018-02-26 DIAGNOSIS — M5126 Other intervertebral disc displacement, lumbar region: Secondary | ICD-10-CM | POA: Diagnosis not present

## 2018-02-26 DIAGNOSIS — M5116 Intervertebral disc disorders with radiculopathy, lumbar region: Secondary | ICD-10-CM | POA: Diagnosis not present

## 2018-02-26 DIAGNOSIS — M5137 Other intervertebral disc degeneration, lumbosacral region: Secondary | ICD-10-CM | POA: Diagnosis not present

## 2018-02-26 DIAGNOSIS — M48061 Spinal stenosis, lumbar region without neurogenic claudication: Secondary | ICD-10-CM | POA: Diagnosis not present

## 2018-02-26 DIAGNOSIS — M545 Low back pain: Secondary | ICD-10-CM | POA: Diagnosis not present

## 2018-02-26 DIAGNOSIS — M461 Sacroiliitis, not elsewhere classified: Secondary | ICD-10-CM | POA: Diagnosis not present

## 2018-02-26 DIAGNOSIS — M7061 Trochanteric bursitis, right hip: Secondary | ICD-10-CM | POA: Diagnosis not present

## 2018-02-26 DIAGNOSIS — K5909 Other constipation: Secondary | ICD-10-CM | POA: Diagnosis not present

## 2018-02-26 DIAGNOSIS — M19012 Primary osteoarthritis, left shoulder: Secondary | ICD-10-CM | POA: Diagnosis not present

## 2018-03-23 ENCOUNTER — Encounter: Payer: Self-pay | Admitting: Physician Assistant

## 2018-03-23 ENCOUNTER — Ambulatory Visit (INDEPENDENT_AMBULATORY_CARE_PROVIDER_SITE_OTHER): Payer: Medicare Other | Admitting: Physician Assistant

## 2018-03-23 VITALS — BP 130/80 | HR 72 | Temp 97.9°F | Resp 16 | Wt 152.0 lb

## 2018-03-23 DIAGNOSIS — R5383 Other fatigue: Secondary | ICD-10-CM

## 2018-03-23 DIAGNOSIS — E559 Vitamin D deficiency, unspecified: Secondary | ICD-10-CM | POA: Diagnosis not present

## 2018-03-23 DIAGNOSIS — E538 Deficiency of other specified B group vitamins: Secondary | ICD-10-CM

## 2018-03-23 DIAGNOSIS — I6523 Occlusion and stenosis of bilateral carotid arteries: Secondary | ICD-10-CM | POA: Diagnosis not present

## 2018-03-23 DIAGNOSIS — F419 Anxiety disorder, unspecified: Secondary | ICD-10-CM | POA: Diagnosis not present

## 2018-03-23 NOTE — Progress Notes (Signed)
Patient: [name] Female    DOB: 01-Jan-1936   82 y.o.   MRN: [id number] Visit Date: 03/23/2018  Today's Provider: Mar Daring, PA-C   Chief Complaint  Patient presents with  . Fatigue   Subjective:    HPI Fatigue: Patient complains of fatigue. Symptoms began several weeks ago. [place] symptom the patient feels fatigue began with: none. Symptoms of her fatigue have been change in appetite, general malaise and lack of interest in usual activities. Patient describes the following psychologic symptoms: none.  Patient denies none. Symptoms have gradually worsened. Severity has been struggles to carry out day to day responsibilities. Previous visits for this problem: none.   She reports sleeping well. Pulmonary fibrosis seems stable per patient. Denies any new stressors.     No Known Allergies   Current Outpatient Medications:  .  betamethasone valerate (VALISONE) 0.1 % cream, Apply 1 application topically daily. , Disp: , Rfl:  .  citalopram (CELEXA) 20 MG tablet, Take 1 tablet (20 mg total) by mouth daily., Disp: 90 tablet, Rfl: 3 .  Cyanocobalamin (B-12) 2500 MCG TABS, Take 2,500 mcg by mouth daily., Disp: , Rfl:  .  ESBRIET 267 MG TABS, Take 6 tablets by mouth daily. , Disp: , Rfl:  .  estradiol (ESTRACE VAGINAL) 0.1 MG/GM vaginal cream, Place 1 Applicatorful vaginally once a week. (1gram weekly), Disp: 42.5 g, Rfl: 2 .  HYDROmorphone (DILAUDID) 2 MG tablet, Take 2 mg by mouth 2 (two) times daily. , Disp: , Rfl:  .  nystatin ointment (MYCOSTATIN), Apply 1 application topically 2 (two) times daily., Disp: 30 g, Rfl: 0 .  OXYGEN, Inhale 2 L into the lungs as needed., Disp: , Rfl:  .  OXYQUINOLONE SULFATE VAGINAL (TRIMO-SAN) 0.025 % GEL, Place 1 Applicatorful vaginally every 14 (fourteen) days., Disp: 45 g, Rfl: 1 .  VOLTAREN 1 % GEL, , Disp: , Rfl: 2 .  zoledronic acid (RECLAST) 5 MG/100ML SOLN injection, Inject into the vein., Disp: , Rfl:  .  aspirin 81 MG  chewable tablet, Chew by mouth., Disp: , Rfl:  .  baclofen (LIORESAL) 10 MG tablet, Take 1 tablet (10 mg total) by mouth 3 (three) times daily. (Patient not taking: Reported on 03/23/2018), Disp: 30 each, Rfl: 0 .  methylPREDNISolone (MEDROL) 4 MG TBPK tablet, 6 day taper; take as directed on package instructions (Patient not taking: Reported on 02/11/2018), Disp: 21 tablet, Rfl: 0  Review of Systems  Constitutional: Positive for activity change, appetite change and fatigue.  HENT: Negative.   Respiratory: Positive for shortness of breath ("She uses oygen"). Negative for cough, chest tightness and wheezing.   Cardiovascular: Negative for chest pain, palpitations and leg swelling.  Gastrointestinal: Negative.   Neurological: Positive for weakness. Negative for dizziness, light-headedness and headaches.  Psychiatric/Behavioral: Negative.     Social History   Tobacco Use  . Smoking status: Never Smoker  . Smokeless tobacco: Never Used  Substance Use Topics  . Alcohol use: No   Objective:   BP 130/80 (BP Location: Left Arm, Patient Position: Sitting, Cuff Size: Normal)   Pulse 72   Temp 97.9 F (36.6 C) (Oral)   Resp 16   Wt 152 lb (68.9 kg)   SpO2 95% Comment: room temperature. She reports she had the oxxygen all night.  BMI 29.69 kg/m  Vitals:   03/23/18 0838  BP: 130/80  Pulse: 72  Resp: 16  Temp: 97.9 F (36.6 C)  TempSrc: Oral  SpO2: 95%  Weight: 152 lb (68.9 kg)     Physical Exam  Constitutional: She appears well-developed and well-nourished. No distress.  HENT:  Head: Normocephalic and atraumatic.  Right Ear: External ear normal.  Left Ear: External ear normal.  Nose: Nose normal.  Mouth/Throat: Oropharynx is clear and moist.  Eyes: Pupils are equal, round, and reactive to light. EOM are normal.  Neck: Normal range of motion. Neck supple.  Cardiovascular: Normal rate, regular rhythm and normal heart sounds. Exam reveals no gallop and no friction rub.  No  murmur heard. Pulmonary/Chest: Effort normal and breath sounds normal. No respiratory distress. She has no wheezes. She has no rales.  Musculoskeletal: She exhibits no edema.  Lymphadenopathy:    She has no cervical adenopathy.  Skin: She is not diaphoretic.  Vitals reviewed.       Assessment & Plan:     1. Fatigue, unspecified type Unsure of cause. Suspect possibly some underlying depression as patient reports she is worried about her upcoming birthday. She reports she has not been taking her citalopram daily, but only as needed. Discussed that this is not how this medication is taken and that she needs to take daily and it may take 4-6 weeks to become effective. She is going to try to take as prescribed and see if this will help. Also will try to check labs as below. I will f/u pending lab results. She is to call if symptoms worsen or change. - Vitamin D (25 hydroxy) - B12 - CBC  2. Anxiety See above medical treatment plan. - Vitamin D (25 hydroxy) - B12 - CBC  3. Vitamin D deficiency See above medical treatment plan. - Vitamin D (25 hydroxy) - B12 - [place], PA-C  [facility]

## 2018-03-23 NOTE — Patient Instructions (Signed)

## 2018-03-25 DIAGNOSIS — R5383 Other fatigue: Secondary | ICD-10-CM | POA: Diagnosis not present

## 2018-03-25 DIAGNOSIS — E538 Deficiency of other specified B group vitamins: Secondary | ICD-10-CM | POA: Diagnosis not present

## 2018-03-25 DIAGNOSIS — F419 Anxiety disorder, unspecified: Secondary | ICD-10-CM | POA: Diagnosis not present

## 2018-03-25 DIAGNOSIS — E559 Vitamin D deficiency, unspecified: Secondary | ICD-10-CM | POA: Diagnosis not present

## 2018-03-26 ENCOUNTER — Telehealth: Payer: Self-pay

## 2018-03-26 LAB — CBC
Hematocrit: 41.9 % (ref 34.0–46.6)
Hemoglobin: 13.8 g/dL (ref 11.1–15.9)
MCH: 30.3 pg (ref 26.6–33.0)
MCHC: 32.9 g/dL (ref 31.5–35.7)
MCV: 92 fL (ref 79–97)
Platelets: 234 10*3/uL (ref 150–450)
RBC: 4.56 x10E6/uL (ref 3.77–5.28)
RDW: 14 % (ref 12.3–15.4)
WBC: 6.3 10*3/uL (ref 3.4–10.8)

## 2018-03-26 LAB — VITAMIN B12: Vitamin B-12: 684 pg/mL (ref 232–1245)

## 2018-03-26 LAB — VITAMIN D 25 HYDROXY (VIT D DEFICIENCY, FRACTURES): Vit D, 25-Hydroxy: 23.1 ng/mL — ABNORMAL LOW (ref 30.0–100.0)

## 2018-03-26 NOTE — Telephone Encounter (Signed)
-----   Message from Mar Daring, PA-C sent at 03/26/2018  8:51 AM EDT ----- No signs of anemia either.

## 2018-03-26 NOTE — Telephone Encounter (Signed)
-----   Message from Mar Daring, PA-C sent at 03/26/2018  8:50 AM EDT ----- No anemia. Vit D is borderline low. B12 is normal. I would recommend a Vit D supplement OTC of 1000-2000 IU daily. We can recheck Vit D in 3 months.

## 2018-03-26 NOTE — Telephone Encounter (Signed)
Patient advised as directed below. Patient requested copy of her labs.Labs printed and placed up front ready for pick up.  Thanks,  -Joseline

## 2018-03-27 ENCOUNTER — Telehealth: Payer: Self-pay

## 2018-03-27 NOTE — Telephone Encounter (Signed)
LMTCB- pt is due for her AWV after 04/16/18. -MM

## 2018-03-27 NOTE — Telephone Encounter (Signed)
Pt CB to say that she received the VM and would like to schedule her AWV but will have to CB. She is currently in an office. Advised pt if we did not hear back from her that we would call her next week.  -MM

## 2018-04-02 NOTE — Telephone Encounter (Signed)
Patient scheduled patient with [facility] 8/26

## 2018-04-02 NOTE — Telephone Encounter (Signed)
Patient wants to know if its necessary that she come for an AWE since she has already been seen recently and had "a lot of test done".

## 2018-04-20 ENCOUNTER — Other Ambulatory Visit: Payer: Self-pay

## 2018-04-20 ENCOUNTER — Emergency Department: Payer: Medicare Other

## 2018-04-20 ENCOUNTER — Emergency Department
Admission: EM | Admit: 2018-04-20 | Discharge: 2018-04-20 | Disposition: A | Discharge status: Home / Self Care | Payer: Medicare Other | Attending: Emergency Medicine | Admitting: Emergency Medicine

## 2018-04-20 ENCOUNTER — Encounter: Payer: Self-pay | Admitting: *Deleted

## 2018-04-20 DIAGNOSIS — M545 Low back pain: Secondary | ICD-10-CM | POA: Diagnosis not present

## 2018-04-20 DIAGNOSIS — Y998 Other external cause status: Secondary | ICD-10-CM | POA: Insufficient documentation

## 2018-04-20 DIAGNOSIS — S82031A Displaced transverse fracture of right patella, initial encounter for closed fracture: Secondary | ICD-10-CM | POA: Diagnosis not present

## 2018-04-20 DIAGNOSIS — Y9289 Other specified places as the place of occurrence of the external cause: Secondary | ICD-10-CM | POA: Diagnosis not present

## 2018-04-20 DIAGNOSIS — Z7982 Long term (current) use of aspirin: Secondary | ICD-10-CM | POA: Diagnosis not present

## 2018-04-20 DIAGNOSIS — I10 Essential (primary) hypertension: Secondary | ICD-10-CM | POA: Diagnosis not present

## 2018-04-20 DIAGNOSIS — Z79899 Other long term (current) drug therapy: Secondary | ICD-10-CM | POA: Insufficient documentation

## 2018-04-20 DIAGNOSIS — S81812A Laceration without foreign body, left lower leg, initial encounter: Secondary | ICD-10-CM

## 2018-04-20 DIAGNOSIS — S81822A Laceration with foreign body, left lower leg, initial encounter: Secondary | ICD-10-CM | POA: Diagnosis not present

## 2018-04-20 DIAGNOSIS — S82034A Nondisplaced transverse fracture of right patella, initial encounter for closed fracture: Secondary | ICD-10-CM | POA: Diagnosis not present

## 2018-04-20 DIAGNOSIS — W109XXA Fall (on) (from) unspecified stairs and steps, initial encounter: Secondary | ICD-10-CM | POA: Insufficient documentation

## 2018-04-20 DIAGNOSIS — Y939 Activity, unspecified: Secondary | ICD-10-CM | POA: Diagnosis not present

## 2018-04-20 DIAGNOSIS — Y92009 Unspecified place in unspecified non-institutional (private) residence as the place of occurrence of the external cause: Secondary | ICD-10-CM

## 2018-04-20 DIAGNOSIS — W19XXXA Unspecified fall, initial encounter: Secondary | ICD-10-CM

## 2018-04-20 DIAGNOSIS — E7800 Pure hypercholesterolemia, unspecified: Secondary | ICD-10-CM | POA: Diagnosis not present

## 2018-04-20 DIAGNOSIS — S3992XA Unspecified injury of lower back, initial encounter: Secondary | ICD-10-CM | POA: Diagnosis not present

## 2018-04-20 MED ORDER — CEPHALEXIN 500 MG PO CAPS: 1000.0000 mg | ORAL_CAPSULE | Freq: Two times a day (BID) | ORAL | 0 refills | Status: DC

## 2018-04-20 MED ORDER — HYDROMORPHONE HCL 1 MG/ML IJ SOLN
1.0000 mg | Freq: Once | INTRAMUSCULAR | Status: AC
  Administered 2018-04-20: 1 mg via INTRAMUSCULAR
  Filled 2018-04-20: qty 1

## 2018-04-20 MED ORDER — PREDNISONE 50 MG PO TABS: 50.0000 mg | ORAL_TABLET | Freq: Every day | ORAL | 0 refills | Status: DC

## 2018-04-20 NOTE — Discharge Instructions (Signed)
Follow-up with orthopedics for right knee patellar fracture.  Keep knee immobilizer in place at all times.  You may minimally bear weight if necessary but do not perform bending of the knee.  Follow-up with wound care for skin tear/deep avulsion of the skin to the left lower extremity.  If unable to be seen in a timely manner by wound care, follow-up with primary care for wound care/dressing change.  If unable to be seen by both wound care and primary care, follow-up in the emergency department.  Take entire prescription of antibiotics.  Let orthopedics know that she will need an increase in your dosing of pain management due to new injury.

## 2018-04-20 NOTE — ED Provider Notes (Signed)
[facility] Emergency Department Provider Note  ____________________________________________  Time seen: Approximately 4:41 PM  I have reviewed the triage vital signs and the nursing notes.   HISTORY  Chief Complaint Fall and Back Pain    HPI [name] is a 82 y.o. female who presents the emergency department complaining of lower back pain, right knee pain, multiple skin tears from a fall.  Patient reports that she missed a step, fell forward landing on bilateral knees.  Patient's main complaint is lower back pain, right knee pain and skin tears.  She did not hit her head or lose consciousness.  Patient reports that bleeding was easily controlled from all of her skin tears.  She reports that most are minor, however she has a "large" skin tear with my "fat tissue exposed."  Patient with a history of lumbar compression fracture and discectomy and states that she is having sharp lower back pain.  No radiation.  No bowel or bladder dysfunction, saddle anesthesia, paresthesias.  Patient has taken her prescribed pain medication, hydromorphone tablet since injury with limited relief.  No other medications prior to arrival.  No other complaints at this time.  Patient reports she is up-to-date on tetanus immunization.    Past Medical History:  Diagnosis Date  . Altered mental state   . Anxiety   . BMI 30.0-30.9,adult   . Cellulitis   . Chest pain, atypical   . Compression fracture of L4 lumbar vertebra   . Constipation   . Cystitis   . Cystocele   . Diverticulosis   . Fatigue   . 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 Gastroenteritis   . History of back surgery   . History of herniated intervertebral disc   . HTN (hypertension)   . Hypercholesteremia   . Hypocalcemia   . Incontinence of urine   . OA (osteoarthritis)    shoulder and back  . Obesity   . Osteopenia   . Pneumonia   . Shortness of breath   . Sleep apnea   . Thrush   . Vitamin D  deficiency     Patient Active Problem List   Diagnosis Date Noted  . Vitamin D deficiency 03/23/2018  . 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 Tinea corporis 04/26/2016  . Family history of colon cancer requiring screening colonoscopy 04/26/2016  . Slow transit constipation 04/26/2016  . Candidiasis 01/17/2016  . Hypercholesteremia 04/24/2015  . Mild cognitive impairment 04/24/2015  . Compression fracture of L4 lumbar vertebra 04/24/2015  . Vaginal pessary in situ 04/19/2015  . Cystocele with uterine descensus 03/30/2015  . Anxiety 02/02/2015  . Arthritis 02/02/2015  . Hypertension 02/02/2015  . Sleep apnea 02/02/2015  . 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 CATARACT EXTRACTION W/PHACO Left 04/24/2016   Procedure: CATARACT EXTRACTION PHACO AND INTRAOCULAR LENS PLACEMENT (IOC);  Surgeon: Leandrew Koyanagi, MD;  Location: [facility];  Service: Ophthalmology;  Laterality: Left;  . CATARACT EXTRACTION W/PHACO Right 07/14/2017   Procedure: CATARACT EXTRACTION PHACO AND INTRAOCULAR LENS PLACEMENT ([place])  RIGHT;  Surgeon: Leandrew Koyanagi, MD;  Location: [facility];  Service: Ophthalmology;  Laterality: Right;  . ROTATOR CUFF REPAIR Bilateral    left-12/2009; right-03/2009 dr.califf  . TOTAL SHOULDER REPLACEMENT Left     Prior to Admission medications   Medication Sig Start Date End Date Taking? Authorizing Provider  aspirin 81 MG chewable tablet Chew by mouth.    [provider]  baclofen (LIORESAL) 10 MG tablet Take 1 tablet (10 mg total) by mouth 3 (three) times daily. Patient not taking: Reported on 03/23/2018 02/03/18   Mar Daring, PA-C  betamethasone valerate (VALISONE) 0.1 % cream Apply 1 application topically daily.  08/19/16   [provider]  cephALEXin (KEFLEX) 500 MG capsule Take 2 capsules (1,000 mg total) by mouth 2 (two) times daily. 04/20/18   Cuthriell, Charline Bills,  PA-C  citalopram (CELEXA) 20 MG tablet Take 1 tablet (20 mg total) by mouth daily. 04/22/17   Mar Daring, PA-C  Cyanocobalamin (B-12) 2500 MCG TABS Take 2,500 mcg by mouth daily.    [provider]  ESBRIET 267 MG TABS Take 6 tablets by mouth daily.  02/25/17   [provider]  estradiol (ESTRACE VAGINAL) 0.1 MG/GM vaginal cream Place 1 Applicatorful vaginally once a week. (1gram weekly) 03/11/17   Rubie Maid, MD  HYDROmorphone (DILAUDID) 2 MG tablet Take 2 mg by mouth 2 (two) times daily.     [provider]  nystatin ointment (MYCOSTATIN) Apply 1 application topically 2 (two) times daily. 08/29/17   Rubie Maid, MD  OXYGEN Inhale 2 L into the lungs as needed.    [provider]  OXYQUINOLONE SULFATE VAGINAL (TRIMO-SAN) 0.025 % GEL Place 1 Applicatorful vaginally every 14 (fourteen) days. 05/08/16   Rubie Maid, MD  predniSONE (DELTASONE) 50 MG tablet Take 1 tablet (50 mg total) by mouth daily with breakfast. 04/20/18   Cuthriell, Charline Bills, PA-C  VOLTAREN 1 % GEL  12/23/17   [provider]  zoledronic acid (RECLAST) 5 MG/100ML SOLN injection Inject into the vein.    [provider]    Allergies Patient has no known allergies.  Family History  Problem Relation Age of Onset  . COPD Mother   . Cancer Brother        colon    Social History Social History   Tobacco Use  . Smoking status: Never Smoker  . Smokeless tobacco: Never Used  Substance Use Topics  . Alcohol use: No  . Drug use: No     Review of Systems  Constitutional: No fever/chills Eyes: No visual changes. No discharge ENT: No upper respiratory complaints. Cardiovascular: no chest pain. Respiratory: no cough. No SOB. Gastrointestinal: No abdominal pain.  No nausea, no vomiting.   Musculoskeletal: Positive for lower back pain, right knee pain Skin: Multiple skin abrasions to the left forearm, right knee, left lower extremity. Neurological: Negative  for headaches, focal weakness or numbness. 10-point ROS otherwise negative.  ____________________________________________   PHYSICAL EXAM:  VITAL SIGNS: ED Triage Vitals  Enc Vitals Group     BP 04/20/18 1602 126/83     Pulse Rate 04/20/18 1602 82     Resp 04/20/18 1602 18     Temp 04/20/18 1602 98.2 F (36.8 C)     Temp Source 04/20/18 1602 Oral     SpO2 04/20/18 1602 96 %     Weight 04/20/18 1602 151 lb (68.5 kg)     Height 04/20/18 1602 5' (1.524 m)     Head Circumference --      Peak Flow --      Pain Score 04/20/18 1606 10     Pain Loc --      Pain Edu? --      Excl. in [place]? --      Constitutional: Alert and oriented. Well appearing and in no acute distress. Eyes: Conjunctivae are normal. PERRL. EOMI. Head:  Atraumatic. ENT:      Ears:       Nose: No congestion/rhinnorhea.      Mouth/Throat: Mucous membranes are moist.  Neck: No stridor.  No cervical spine tenderness to palpation  Cardiovascular: Normal rate, regular rhythm. Normal S1 and S2.  Good peripheral circulation. Respiratory: Normal respiratory effort without tachypnea or retractions. Lungs CTAB. Good air entry to the bases with no decreased or absent breath sounds. Gastrointestinal: Bowel sounds 4 quadrants. Soft and nontender to palpation. No guarding or rigidity. No palpable masses. No distention.  Musculoskeletal: Full range of motion to all extremities. No gross deformities appreciated.  Utilization of the lumbar spine reveals no deformity, gross signs of trauma.  Sharp midline tenderness to palpation in the L3-L5 region.  No step-off or palpable abnormality.  No tenderness to palpation of bilateral sciatic notches.  Dorsalis pedis pulse and sensation intact bilateral lower extremities.  Examination of the right knee reveals edema, positive ballottement test.  Patient has good extension, flexion of the knee joint.  Very tender to palpation along the medial joint line with no palpable abnormality or deficit.   Varus, valgus, Lachman's, McMurray's is negative. Neurologic:  Normal speech and language. No gross focal neurologic deficits are appreciated.  Skin:  Skin is warm, dry and intact. No rash noted.  Multiple skin abrasions/skin tears are noted.  Patient has relatively superficial abrasion of the right knee.  No bleeding.  No foreign body.  Visualization of the left forearm reveals small skin tear with no bleeding, foreign body.  Patient has a significant skin tear to the left lower extremity.  Missing epidermal tissue is appreciated to area.  No active bleeding.  Subcutaneous tissue exposed.  This measures approximately 9 cm wide by 7 cm tall.  No muscle exposure. Psychiatric: Mood and affect are normal. Speech and behavior are normal. Patient exhibits appropriate insight and judgement.   ____________________________________________   LABS (all labs ordered are listed, but only abnormal results are displayed)  Labs Reviewed - No data to display ____________________________________________  EKG   ____________________________________________  RADIOLOGY I personally viewed and evaluated these images as part of my medical decision making, as well as reviewing the written report by the radiologist.  Dg Lumbar Spine Complete  Result Date: 04/20/2018 CLINICAL DATA:  Patient fell outside on concrete steps. Low back pain. EXAM: LUMBAR SPINE - COMPLETE 4+ VIEW COMPARISON:  12/10/2013 K[facility], MRI 03/09/2009 FINDINGS: Diffuse generalized osteopenia of the included thoracolumbar spine. Vertebral augmentation at L1 and L4. Moderate 50% height loss of L1 is seen on the lateral projection, stable since prior MRI. Superior endplate depression of L3 is stable. There is moderate degenerative disc flattening consistent with degenerative disc disease L3-4. Lower lumbar facet arthropathy is identified along the entirety of the lumbar spine greatest from L3 through S1. IMPRESSION: 1. Osteopenic appearance of the  included lower thoracic and lumbar spine with vertebral augmentation at L1 and L4. 2. Chronic compression deformities L1 and superior endplate of L3 are stable. 3. Lumbar facet arthropathy is identified L3 through S1. Electronically Signed   By: Ashley Royalty M.D.   On: 04/20/2018 17:58   Dg Knee Complete 4 Views Right  Result Date: 04/20/2018 CLINICAL DATA:  Patient fell outside on concrete steps. Skin tear of left lower leg and abrasions both knees. EXAM: RIGHT KNEE - COMPLETE 4+ VIEW COMPARISON:  None. FINDINGS: There is a moderate-sized suprapatellar joint effusion associated with acute transverse fractures of the mid patella without distraction of the  fracture fragments. Shallow depression of the lateral tibial plateau suggested as well suspicious for a lateral tibial plateau fracture. Infrapatellar anterior soft tissue laceration is identified. No fracture of the included femur. Intact included fibula. IMPRESSION: 1. Acute, closed, transverse fractures of the mid patella without distraction of the fracture fragments. 2. Moderate suprapatellar joint effusion. 3. Suggestion of mild shallow depression of the lateral tibial plateau potentially representing a Schatzker type 3 lateral tibial plateau fracture with slight depression of the tibial plateau. Electronically Signed   By: Ashley Royalty M.D.   On: 04/20/2018 17:51    ____________________________________________    PROCEDURES  Procedure(s) performed:    Marland KitchenMarland KitchenLaceration Repair Date/Time: 04/20/2018 7:05 PM Performed by: Darletta Moll, PA-C Authorized by: Darletta Moll, PA-C   Consent:    Consent obtained:  Verbal   Consent given by:  Patient   Risks discussed:  Pain, poor cosmetic result, poor wound healing, infection and need for additional repair   Alternatives discussed:  Referral Anesthesia (see MAR for exact dosages):    Anesthesia method:  None Laceration details:    Location:  Leg   Leg location:  L lower leg    Length (cm):  9 Repair type:    Repair type:  Simple Exploration:    Hemostasis achieved with:  Direct pressure   Wound exploration: wound explored through full range of motion and entire depth of wound probed and visualized     Wound extent: no foreign bodies/material noted, no muscle damage noted, no nerve damage noted, no tendon damage noted, no underlying fracture noted and no vascular damage noted     Contaminated: no   Treatment:    Area cleansed with:  Shur-Clens   Amount of cleaning:  Standard   Irrigation solution:  Sterile saline   Irrigation volume:  1 L   Irrigation method:  Syringe Approximation:    Approximation:  Loose Post-procedure details:    Dressing:  Non-adherent dressing and tube gauze   Patient tolerance of procedure:  Tolerated well, no immediate complications Comments:     Significant avulsion/skin tear injury noted to the left lower leg.  This was unable to be closed due to skin loss.  Area was covered using nonadherent dressing, wrapped with Ace bandage.  Patient tolerated well.  Extensive cleaning prior to dressing applied.  Marland KitchenSplint Application Date/Time: 1/76/1607 7:08 PM Performed by: Darletta Moll, PA-C Authorized by: Darletta Moll, PA-C   Consent:    Consent obtained:  Verbal   Consent given by:  Patient   Risks discussed:  Pain Pre-procedure details:    Sensation:  Normal Procedure details:    Laterality:  Right   Location:  Knee   Knee:  R knee   Splint type:  Knee immobilizer   Supplies:  Aluminum splint Post-procedure details:    Pain:  Improved   Sensation:  Normal   Patient tolerance of procedure:  Tolerated well, no immediate complications      Medications  HYDROmorphone (DILAUDID) injection 1 mg (has no administration in time range)     ____________________________________________   INITIAL IMPRESSION / ASSESSMENT AND PLAN / ED COURSE  Pertinent labs & imaging results that were available during my care  of the patient were reviewed by me and considered in my medical decision making (see chart for details).  Review of the  [place]RS was performed in accordance of the [place] prior to dispensing any controlled drugs.      Patient's diagnosis is consistent with  fall resulting in patellar fracture, deep skin avulsion to the left lower leg.  Patient presents after a fall.  Patient was complaining of knee pain, lower back pain, deep skin tear.  Positive ballottement to the right knee, extreme tenderness to palpation of the patella and medial joint line.  Patient does have a patellar fracture, tibial plateau depression.  Knee immobilizer placed in the emergency department.  Wound care provided to deep avulsion.  Given nature of avulsion, he was unable to be closed.  Patient will be placed on antibiotics prophylactically and advised to follow-up with wound care given nature of injury..  Patient has orthopedics for her chronic lower back issues, she will follow-up with them in 3 days and is advised to call them prior to seeing them to have her right knee evaluated as well.  Patient is given ED precautions to return to the ED for any worsening or new symptoms.     ____________________________________________  FINAL CLINICAL IMPRESSION(S) / ED DIAGNOSES  Final diagnoses:  Closed nondisplaced transverse fracture of right patella, initial encounter  Fall in home, initial encounter  Noninfected skin tear of left lower extremity, initial encounter      NEW MEDICATIONS STARTED DURING THIS VISIT:  ED Discharge Orders         Ordered    cephALEXin (KEFLEX) 500 MG capsule  2 times daily     04/20/18 1903    predniSONE (DELTASONE) 50 MG tablet  Daily with breakfast     04/20/18 1903              This chart was dictated using voice recognition software/Dragon. Despite best efforts to proofread, errors can occur which can change the meaning. Any change was purely unintentional.    Darletta Moll, PA-C 04/20/18 1917    Nance Pear, MD 04/20/18 2025

## 2018-04-20 NOTE — ED Triage Notes (Signed)
Pt to triage via wheelchair.  Pt on 2 liters oxygen.  Pt fell outside today on concrete steps.  Pt has large skin tear to left lower leg and abrasions to both knees.  No loc.  No head injury.     Pt also has lower back pain. Pt alert.  Speech clear

## 2018-04-20 NOTE — ED Notes (Signed)
Ron pa-c states pt can be seen in flex area.

## 2018-04-20 NOTE — ED Notes (Addendum)
See triage note  States she fell on concrete. Having back pain and has abrasions and skin tear to legs

## 2018-04-22 DIAGNOSIS — M545 Low back pain: Secondary | ICD-10-CM | POA: Diagnosis not present

## 2018-04-22 DIAGNOSIS — S82001A Unspecified fracture of right patella, initial encounter for closed fracture: Secondary | ICD-10-CM | POA: Diagnosis not present

## 2018-04-23 ENCOUNTER — Encounter: Payer: Medicare Other | Attending: Nurse Practitioner | Admitting: Nurse Practitioner

## 2018-04-23 ENCOUNTER — Ambulatory Visit: Payer: Medicare Other | Admitting: Physician Assistant

## 2018-04-23 DIAGNOSIS — S81812A Laceration without foreign body, left lower leg, initial encounter: Secondary | ICD-10-CM | POA: Diagnosis not present

## 2018-04-23 DIAGNOSIS — X58XXXS Exposure to other specified factors, sequela: Secondary | ICD-10-CM | POA: Diagnosis not present

## 2018-04-23 DIAGNOSIS — S80212S Abrasion, left knee, sequela: Secondary | ICD-10-CM | POA: Insufficient documentation

## 2018-04-23 DIAGNOSIS — I10 Essential (primary) hypertension: Secondary | ICD-10-CM | POA: Insufficient documentation

## 2018-04-23 DIAGNOSIS — S81011A Laceration without foreign body, right knee, initial encounter: Secondary | ICD-10-CM | POA: Diagnosis not present

## 2018-04-27 ENCOUNTER — Ambulatory Visit: Payer: Self-pay

## 2018-04-28 ENCOUNTER — Telehealth: Payer: Self-pay

## 2018-04-28 NOTE — Telephone Encounter (Signed)
Called pt to reschedule AWV. Pt states that she going to have to hold off on scheduling for a while due to a recent fall. Pt states she has a lot of apt coming up and she would rather wait until her legs get better. Will put a reminder in to call 06/2018. -MM

## 2018-04-29 DIAGNOSIS — M25519 Pain in unspecified shoulder: Secondary | ICD-10-CM | POA: Diagnosis not present

## 2018-04-29 DIAGNOSIS — M5416 Radiculopathy, lumbar region: Secondary | ICD-10-CM | POA: Diagnosis not present

## 2018-04-29 DIAGNOSIS — M25512 Pain in left shoulder: Secondary | ICD-10-CM | POA: Diagnosis not present

## 2018-04-30 ENCOUNTER — Encounter: Payer: Medicare Other | Admitting: Nurse Practitioner

## 2018-04-30 DIAGNOSIS — S81811A Laceration without foreign body, right lower leg, initial encounter: Secondary | ICD-10-CM | POA: Diagnosis not present

## 2018-04-30 DIAGNOSIS — S81812A Laceration without foreign body, left lower leg, initial encounter: Secondary | ICD-10-CM | POA: Diagnosis not present

## 2018-04-30 DIAGNOSIS — S81011A Laceration without foreign body, right knee, initial encounter: Secondary | ICD-10-CM | POA: Diagnosis not present

## 2018-04-30 DIAGNOSIS — S81012A Laceration without foreign body, left knee, initial encounter: Secondary | ICD-10-CM | POA: Diagnosis not present

## 2018-04-30 DIAGNOSIS — M19072 Primary osteoarthritis, left ankle and foot: Secondary | ICD-10-CM | POA: Diagnosis not present

## 2018-04-30 DIAGNOSIS — I10 Essential (primary) hypertension: Secondary | ICD-10-CM | POA: Diagnosis not present

## 2018-04-30 DIAGNOSIS — S80212S Abrasion, left knee, sequela: Secondary | ICD-10-CM | POA: Diagnosis not present

## 2018-05-01 ENCOUNTER — Other Ambulatory Visit: Payer: Self-pay | Admitting: Physician Assistant

## 2018-05-01 DIAGNOSIS — F419 Anxiety disorder, unspecified: Secondary | ICD-10-CM

## 2018-05-01 MED ORDER — CITALOPRAM HYDROBROMIDE 20 MG PO TABS: 20.0000 mg | ORAL_TABLET | Freq: Every day | ORAL | 3 refills | Status: DC

## 2018-05-01 NOTE — Telephone Encounter (Signed)
Pt contacted office for refill request on the following medications:  citalopram (CELEXA) 20 MG tablet  [facility] [address]  Last Rx: 04/22/17 with 3 refills LOV: 03/23/18 Pt stated she has been out of the medication a few days and would like the Rx sent in today if possible since we are closed until 05/05/18 for the holiday. Pt stated that she thought her pharmacy had already requested the medication. Pt was advised Tawanna Sat is out of the office can another provider approve the medication? Please advise. Thanks TNP

## 2018-05-02 DIAGNOSIS — S82001D Unspecified fracture of right patella, subsequent encounter for closed fracture with routine healing: Secondary | ICD-10-CM | POA: Diagnosis not present

## 2018-05-02 DIAGNOSIS — M1990 Unspecified osteoarthritis, unspecified site: Secondary | ICD-10-CM | POA: Diagnosis not present

## 2018-05-02 DIAGNOSIS — S81811D Laceration without foreign body, right lower leg, subsequent encounter: Secondary | ICD-10-CM | POA: Diagnosis not present

## 2018-05-02 DIAGNOSIS — Z9981 Dependence on supplemental oxygen: Secondary | ICD-10-CM | POA: Diagnosis not present

## 2018-05-02 DIAGNOSIS — I10 Essential (primary) hypertension: Secondary | ICD-10-CM | POA: Diagnosis not present

## 2018-05-02 DIAGNOSIS — S80212D Abrasion, left knee, subsequent encounter: Secondary | ICD-10-CM | POA: Diagnosis not present

## 2018-05-02 DIAGNOSIS — Z9181 History of falling: Secondary | ICD-10-CM | POA: Diagnosis not present

## 2018-05-02 DIAGNOSIS — J84112 Idiopathic pulmonary fibrosis: Secondary | ICD-10-CM | POA: Diagnosis not present

## 2018-05-02 DIAGNOSIS — S81812D Laceration without foreign body, left lower leg, subsequent encounter: Secondary | ICD-10-CM | POA: Diagnosis not present

## 2018-05-02 DIAGNOSIS — S80211D Abrasion, right knee, subsequent encounter: Secondary | ICD-10-CM | POA: Diagnosis not present

## 2018-05-02 NOTE — Progress Notes (Signed)
[name], [name] ([id number]) Visit Report for 04/23/2018 Allergy List Details Patient Name: [name], [name]. Date of Service: 04/23/2018 8:00 AM Medical Record Number: [id number] Patient Account Number: [id number] Date of Birth/Sex: 21-Dec-1935 (81 y.o. Female) Treating RN: Montey Hora Primary Care Remee Charley: Fenton Malling Other Clinician: Referring Arlett Goold: Nance Pear Treating Basir Niven/Extender: Lawanda Cousins Weeks in Treatment: 0 Allergies Active Allergies No Known Allergies Allergy Notes Electronic Signature(s) Signed: 04/24/2018 4:18:40 PM By: Montey Hora Entered By: Montey Hora on 04/23/2018 08:12:38 [name], [name] ([id number]) -------------------------------------------------------------------------------- Arrival Information Details Patient Name: [name], [name]. Date of Service: 04/23/2018 8:00 AM Medical Record Number: [id number] Patient Account Number: [id number] Date of Birth/Sex: March 08, 1936 (81 y.o. Female) Treating RN: Montey Hora Primary Care Huda Petrey: Fenton Malling Other Clinician: Referring Timothy Townsel: Nance Pear Treating Estrella Alcaraz/Extender: Cathie Olden in Treatment: 0 Visit Information Patient Arrived: Wheel Chair Arrival Time: 08:07 Accompanied By: spouse Transfer Assistance: Manual Patient Identification Verified: Yes Secondary Verification Process Completed: Yes Electronic Signature(s) Signed: 04/24/2018 4:18:40 PM By: Montey Hora Entered By: Montey Hora on 04/23/2018 08:09:03 [name] ([id number]) -------------------------------------------------------------------------------- Clinic Level of Care Assessment Details Patient Name: [name], [name] Date of Service: 04/23/2018 8:00 AM Medical Record Number: [id number] Patient Account Number: [id number] Date of Birth/Sex: 09/14/1935 (81 y.o. Female) Treating RN: Montey Hora Primary Care Phillipa Morden: Fenton Malling Other  Clinician: Referring Ahriana Gunkel: Nance Pear Treating Allicia Culley/Extender: Cathie Olden in Treatment: 0 Clinic Level of Care Assessment Items TOOL 2 Quantity Score []  - Use when only an EandM is performed on the INITIAL visit 0 ASSESSMENTS - Nursing Assessment / Reassessment X - General Physical Exam (combine w/ comprehensive assessment (listed just below) when 1 20 performed on new pt. evals) X- 1 25 Comprehensive Assessment (HX, ROS, Risk Assessments, Wounds Hx, etc.) ASSESSMENTS - Wound and Skin Assessment / Reassessment []  - Simple Wound Assessment / Reassessment - one wound 0 X- 3 5 Complex Wound Assessment / Reassessment - multiple wounds []  - 0 Dermatologic / Skin Assessment (not related to wound area) ASSESSMENTS - Ostomy and/or Continence Assessment and Care []  - Incontinence Assessment and Management 0 []  - 0 Ostomy Care Assessment and Management (repouching, etc.) PROCESS - Coordination of Care X - Simple Patient / Family Education for ongoing care 1 15 []  - 0 Complex (extensive) Patient / Family Education for ongoing care []  - 0 Staff obtains Programmer, systems, Records, Test Results / Process Orders []  - 0 Staff telephones HHA, Nursing Homes / Clarify orders / etc []  - 0 Routine Transfer to another Facility (non-emergent condition) []  - 0 Routine Hospital Admission (non-emergent condition) []  - 0 New Admissions / Biomedical engineer / Ordering NPWT, Apligraf, etc. []  - 0 Emergency Hospital Admission (emergent condition) X- 1 10 Simple Discharge Coordination []  - 0 Complex (extensive) Discharge Coordination PROCESS - Special Needs []  - Pediatric / Minor Patient Management 0 []  - 0 Isolation Patient Management [name], [name]. ([id number]) []  - 0 Hearing / Language / Visual special needs []  - 0 Assessment of Community assistance (transportation, D/C planning, etc.) []  - 0 Additional assistance / Altered mentation []  - 0 Support Surface(s)  Assessment (bed, cushion, seat, etc.) INTERVENTIONS - Wound Cleansing / Measurement X - Wound Imaging (photographs - any number of wounds) 1 5 []  - 0 Wound Tracing (instead of photographs) []  - 0 Simple Wound Measurement - one wound X- 3 5 Complex Wound Measurement - multiple wounds []  - 0 Simple Wound Cleansing - one wound X- 3 5 Complex Wound Cleansing - multiple  wounds INTERVENTIONS - Wound Dressings X - Small Wound Dressing one or multiple wounds 1 10 X- 1 15 Medium Wound Dressing one or multiple wounds []  - 0 Large Wound Dressing one or multiple wounds []  - 0 Application of Medications - injection INTERVENTIONS - Miscellaneous []  - External ear exam 0 []  - 0 Specimen Collection (cultures, biopsies, blood, body fluids, etc.) []  - 0 Specimen(s) / Culture(s) sent or taken to Lab for analysis []  - 0 Patient Transfer (multiple staff / Harrel Lemon Lift / Similar devices) []  - 0 Simple Staple / Suture removal (25 or less) []  - 0 Complex Staple / Suture removal (26 or more) []  - 0 Hypo / Hyperglycemic Management (close monitor of Blood Glucose) X- 1 15 Ankle / Brachial Index (ABI) - do not check if billed separately Has the patient been seen at the hospital within the last three years: Yes Total Score: 160 Level Of Care: New/Established - Level 5 Electronic Signature(s) Signed: 04/24/2018 4:18:40 PM By: Montey Hora Entered By: Montey Hora on 04/23/2018 09:29:16 [name], [name] ([id number]) -------------------------------------------------------------------------------- Encounter Discharge Information Details Patient Name: [name], [name] Date of Service: 04/23/2018 8:00 AM Medical Record Number: [id number] Patient Account Number: [id number] Date of Birth/Sex: [birth year] (81 y.o. Female) Treating RN: Montey Hora Primary Care Erubiel Manasco: Fenton Malling Other Clinician: Referring Antwine Agosto: Nance Pear Treating Saranya Harlin/Extender: Cathie Olden in  Treatment: 0 Encounter Discharge Information Items Discharge Condition: Stable Ambulatory Status: Wheelchair Discharge Destination: Home Transportation: Private Auto Accompanied By: spouse Schedule Follow-up Appointment: Yes Clinical Summary of Care: Electronic Signature(s) Signed: 04/24/2018 4:18:40 PM By: Montey Hora Entered By: Montey Hora on 04/23/2018 09:30:58 [name], [name] ([id number]) -------------------------------------------------------------------------------- Lower Extremity Assessment Details Patient Name: [name], [name]. Date of Service: 04/23/2018 8:00 AM Medical Record Number: [id number] Patient Account Number: [id number] Date of Birth/Sex: [birth year] (81 y.o. Female) Treating RN: Montey Hora Primary Care Charistopher Rumble: Fenton Malling Other Clinician: Referring Mukhtar Shams: Nance Pear Treating Jazzlyn Huizenga/Extender: Lawanda Cousins Weeks in Treatment: 0 Edema Assessment Assessed: [Left: No] [Right: No] Edema: [Left: No] [Right: No] Vascular Assessment Pulses: Dorsalis Pedis Palpable: [Left:Yes] [Right:Yes] Doppler Audible: [Left:Yes] [Right:Yes] Posterior Tibial Palpable: [Left:Yes] [Right:Yes] Doppler Audible: [Left:Yes] [Right:Yes] Extremity colors, hair growth, and conditions: Extremity Color: [Left:Normal] [Right:Normal] Hair Growth on Extremity: [Left:No] [Right:No] Temperature of Extremity: [Left:Cool] [Right:Cool] Capillary Refill: [Left:< 3 seconds] [Right:< 3 seconds] Blood Pressure: Brachial: [Right:152] Dorsalis Pedis: [Left:Dorsalis Pedis: 162] Ankle: Posterior Tibial: [Left:Posterior Tibial: 140] [Right:1.07] Toe Nail Assessment Left: Right: Thick: Yes Yes Discolored: Yes Yes Deformed: No No Improper Length and Hygiene: No No Electronic Signature(s) Signed: 04/24/2018 4:18:40 PM By: Montey Hora Entered By: Montey Hora on 04/23/2018 08:33:27 [name], [name]  ([id number]) -------------------------------------------------------------------------------- Multi Wound Chart Details Patient Name: [name]. Date of Service: 04/23/2018 8:00 AM Medical Record Number: [id number] Patient Account Number: [id number] Date of Birth/Sex: [birth year] (81 y.o. Female) Treating RN: Montey Hora Primary Care Katherine Tout: Fenton Malling Other Clinician: Referring Londell Noll: Nance Pear Treating Briyah Wheelwright/Extender: Cathie Olden in Treatment: 0 Vital Signs Height(in): 60 Pulse(bpm): 17 Weight(lbs): 151 Blood Pressure(mmHg): 147/54 Body Mass Index(BMI): 29 Temperature(F): 98.3 Respiratory Rate 16 (breaths/min): Photos: [2:No Photos] [3:No Photos] [4:No Photos] Wound Location: [2:Right Knee] [3:Left Lower Leg - Lateral] [4:Left Lower Leg - Medial] Wounding Event: [2:Trauma] [3:Trauma] [4:Trauma] Primary Etiology: [2:Skin Tear] [3:Skin Tear] [4:Skin Tear] Comorbid History: [2:Hypertension, Osteoarthritis] [3:Hypertension, Osteoarthritis] [4:Hypertension, Osteoarthritis] Date Acquired: [2:04/20/2018] [3:04/20/2018] [4:04/20/2018] Weeks of Treatment: [2:0] [3:0] [4:0] Wound Status: [2:Open] [3:Open] [4:Open] Measurements L x W x D [2:2x2.3x0.1] [3:12.5x11.4x0.2] [4:2.1x2.5x0.1] (  wounds INTERVENTIONS - Wound Dressings X - Small Wound Dressing one or multiple wounds 1 10 X- 1 15 Medium Wound Dressing one or multiple wounds []  - 0 Large Wound Dressing one or multiple wounds []  - 0 Application of Medications - injection INTERVENTIONS - Miscellaneous []  - External ear exam 0 []  - 0 Specimen Collection (cultures, biopsies, blood, body fluids, etc.) []  - 0 Specimen(s) / Culture(s) sent or taken to Lab for analysis []  - 0 Patient Transfer (multiple staff / Harrel Lemon Lift / Similar devices) []  - 0 Simple Staple / Suture removal (25 or less) []  - 0 Complex Staple / Suture removal (26 or more) []  - 0 Hypo / Hyperglycemic Management (close monitor of Blood Glucose) X- 1 15 Ankle / Brachial Index (ABI) - do not check if billed separately Has the patient been seen at the hospital within the last three years: Yes Total Score: 160 Level Of Care: New/Established - Level 5 Electronic Signature(s) Signed: 04/24/2018 4:18:40 PM By: Montey Hora Entered By: Montey Hora on 04/23/2018 09:29:16 [name], [name] ([id number]) -------------------------------------------------------------------------------- Encounter Discharge Information Details Patient Name: [name], [name] Date of Service: 04/23/2018 8:00 AM Medical Record Number: [id number] Patient Account Number: [id number] Date of Birth/Sex: [birth year] (81 y.o. Female) Treating RN: Montey Hora Primary Care Erubiel Manasco: Fenton Malling Other Clinician: Referring Antwine Agosto: Nance Pear Treating Saranya Harlin/Extender: Cathie Olden in  Treatment: 0 Encounter Discharge Information Items Discharge Condition: Stable Ambulatory Status: Wheelchair Discharge Destination: Home Transportation: Private Auto Accompanied By: spouse Schedule Follow-up Appointment: Yes Clinical Summary of Care: Electronic Signature(s) Signed: 04/24/2018 4:18:40 PM By: Montey Hora Entered By: Montey Hora on 04/23/2018 09:30:58 [name], [name] ([id number]) -------------------------------------------------------------------------------- Lower Extremity Assessment Details Patient Name: [name], [name]. Date of Service: 04/23/2018 8:00 AM Medical Record Number: [id number] Patient Account Number: [id number] Date of Birth/Sex: [birth year] (81 y.o. Female) Treating RN: Montey Hora Primary Care Charistopher Rumble: Fenton Malling Other Clinician: Referring Mukhtar Shams: Nance Pear Treating Jazzlyn Huizenga/Extender: Lawanda Cousins Weeks in Treatment: 0 Edema Assessment Assessed: [Left: No] [Right: No] Edema: [Left: No] [Right: No] Vascular Assessment Pulses: Dorsalis Pedis Palpable: [Left:Yes] [Right:Yes] Doppler Audible: [Left:Yes] [Right:Yes] Posterior Tibial Palpable: [Left:Yes] [Right:Yes] Doppler Audible: [Left:Yes] [Right:Yes] Extremity colors, hair growth, and conditions: Extremity Color: [Left:Normal] [Right:Normal] Hair Growth on Extremity: [Left:No] [Right:No] Temperature of Extremity: [Left:Cool] [Right:Cool] Capillary Refill: [Left:< 3 seconds] [Right:< 3 seconds] Blood Pressure: Brachial: [Right:152] Dorsalis Pedis: [Left:Dorsalis Pedis: 162] Ankle: Posterior Tibial: [Left:Posterior Tibial: 140] [Right:1.07] Toe Nail Assessment Left: Right: Thick: Yes Yes Discolored: Yes Yes Deformed: No No Improper Length and Hygiene: No No Electronic Signature(s) Signed: 04/24/2018 4:18:40 PM By: Montey Hora Entered By: Montey Hora on 04/23/2018 08:33:27 [name], [name]  ([id number]) -------------------------------------------------------------------------------- Multi Wound Chart Details Patient Name: [name]. Date of Service: 04/23/2018 8:00 AM Medical Record Number: [id number] Patient Account Number: [id number] Date of Birth/Sex: [birth year] (81 y.o. Female) Treating RN: Montey Hora Primary Care Katherine Tout: Fenton Malling Other Clinician: Referring Londell Noll: Nance Pear Treating Briyah Wheelwright/Extender: Cathie Olden in Treatment: 0 Vital Signs Height(in): 60 Pulse(bpm): 17 Weight(lbs): 151 Blood Pressure(mmHg): 147/54 Body Mass Index(BMI): 29 Temperature(F): 98.3 Respiratory Rate 16 (breaths/min): Photos: [2:No Photos] [3:No Photos] [4:No Photos] Wound Location: [2:Right Knee] [3:Left Lower Leg - Lateral] [4:Left Lower Leg - Medial] Wounding Event: [2:Trauma] [3:Trauma] [4:Trauma] Primary Etiology: [2:Skin Tear] [3:Skin Tear] [4:Skin Tear] Comorbid History: [2:Hypertension, Osteoarthritis] [3:Hypertension, Osteoarthritis] [4:Hypertension, Osteoarthritis] Date Acquired: [2:04/20/2018] [3:04/20/2018] [4:04/20/2018] Weeks of Treatment: [2:0] [3:0] [4:0] Wound Status: [2:Open] [3:Open] [4:Open] Measurements L x W x D [2:2x2.3x0.1] [3:12.5x11.4x0.2] [4:2.1x2.5x0.1] (  [name], [name] ([id number]) Visit Report for 04/23/2018 Allergy List Details Patient Name: [name], [name]. Date of Service: 04/23/2018 8:00 AM Medical Record Number: [id number] Patient Account Number: [id number] Date of Birth/Sex: 21-Dec-1935 (81 y.o. Female) Treating RN: Montey Hora Primary Care Remee Charley: Fenton Malling Other Clinician: Referring Arlett Goold: Nance Pear Treating Basir Niven/Extender: Lawanda Cousins Weeks in Treatment: 0 Allergies Active Allergies No Known Allergies Allergy Notes Electronic Signature(s) Signed: 04/24/2018 4:18:40 PM By: Montey Hora Entered By: Montey Hora on 04/23/2018 08:12:38 [name], [name] ([id number]) -------------------------------------------------------------------------------- Arrival Information Details Patient Name: [name], [name]. Date of Service: 04/23/2018 8:00 AM Medical Record Number: [id number] Patient Account Number: [id number] Date of Birth/Sex: March 08, 1936 (81 y.o. Female) Treating RN: Montey Hora Primary Care Huda Petrey: Fenton Malling Other Clinician: Referring Timothy Townsel: Nance Pear Treating Estrella Alcaraz/Extender: Cathie Olden in Treatment: 0 Visit Information Patient Arrived: Wheel Chair Arrival Time: 08:07 Accompanied By: spouse Transfer Assistance: Manual Patient Identification Verified: Yes Secondary Verification Process Completed: Yes Electronic Signature(s) Signed: 04/24/2018 4:18:40 PM By: Montey Hora Entered By: Montey Hora on 04/23/2018 08:09:03 [name] ([id number]) -------------------------------------------------------------------------------- Clinic Level of Care Assessment Details Patient Name: [name], [name] Date of Service: 04/23/2018 8:00 AM Medical Record Number: [id number] Patient Account Number: [id number] Date of Birth/Sex: 09/14/1935 (81 y.o. Female) Treating RN: Montey Hora Primary Care Phillipa Morden: Fenton Malling Other  Clinician: Referring Ahriana Gunkel: Nance Pear Treating Allicia Culley/Extender: Cathie Olden in Treatment: 0 Clinic Level of Care Assessment Items TOOL 2 Quantity Score []  - Use when only an EandM is performed on the INITIAL visit 0 ASSESSMENTS - Nursing Assessment / Reassessment X - General Physical Exam (combine w/ comprehensive assessment (listed just below) when 1 20 performed on new pt. evals) X- 1 25 Comprehensive Assessment (HX, ROS, Risk Assessments, Wounds Hx, etc.) ASSESSMENTS - Wound and Skin Assessment / Reassessment []  - Simple Wound Assessment / Reassessment - one wound 0 X- 3 5 Complex Wound Assessment / Reassessment - multiple wounds []  - 0 Dermatologic / Skin Assessment (not related to wound area) ASSESSMENTS - Ostomy and/or Continence Assessment and Care []  - Incontinence Assessment and Management 0 []  - 0 Ostomy Care Assessment and Management (repouching, etc.) PROCESS - Coordination of Care X - Simple Patient / Family Education for ongoing care 1 15 []  - 0 Complex (extensive) Patient / Family Education for ongoing care []  - 0 Staff obtains Programmer, systems, Records, Test Results / Process Orders []  - 0 Staff telephones HHA, Nursing Homes / Clarify orders / etc []  - 0 Routine Transfer to another Facility (non-emergent condition) []  - 0 Routine Hospital Admission (non-emergent condition) []  - 0 New Admissions / Biomedical engineer / Ordering NPWT, Apligraf, etc. []  - 0 Emergency Hospital Admission (emergent condition) X- 1 10 Simple Discharge Coordination []  - 0 Complex (extensive) Discharge Coordination PROCESS - Special Needs []  - Pediatric / Minor Patient Management 0 []  - 0 Isolation Patient Management [name], [name]. ([id number]) []  - 0 Hearing / Language / Visual special needs []  - 0 Assessment of Community assistance (transportation, D/C planning, etc.) []  - 0 Additional assistance / Altered mentation []  - 0 Support Surface(s)  Assessment (bed, cushion, seat, etc.) INTERVENTIONS - Wound Cleansing / Measurement X - Wound Imaging (photographs - any number of wounds) 1 5 []  - 0 Wound Tracing (instead of photographs) []  - 0 Simple Wound Measurement - one wound X- 3 5 Complex Wound Measurement - multiple wounds []  - 0 Simple Wound Cleansing - one wound X- 3 5 Complex Wound Cleansing - multiple  0 Clustered Wound: No Photos Photo Uploaded By: Montey Hora on 04/23/2018 13:55:43 Wound Measurements Length: (cm) 12.5 Width: (cm) 11.4 Depth: (cm) 0.2 Area: (cm) 111.919 Volume: (cm) 22.384 % Reduction in Area: % Reduction in Volume: Epithelialization: Small (1-33%) Tunneling: No Undermining: No Wound Description Full Thickness Without Exposed Support Foul O Classification: Structures Slough Wound Margin: Flat and Intact Exudate Large Amount: Exudate Type: Sanguinous Exudate Color: red dor After Cleansing: No /Fibrino Yes Wound Bed Granulation Amount: Large (67-100%) Exposed Structure Granulation Quality: Red Fascia Exposed: No Necrotic Amount: Small  (1-33%) Fat Layer (Subcutaneous Tissue) Exposed: Yes Necrotic Quality: Adherent Slough Tendon Exposed: No Muscle Exposed: No Joint Exposed: No Bone Exposed: No Boursiquot, Madelin J. ([id number]) Periwound Skin Texture Texture Color No Abnormalities Noted: No No Abnormalities Noted: No Callus: No Atrophie Blanche: No Crepitus: No Cyanosis: No Excoriation: No Ecchymosis: No Induration: No Erythema: No Rash: No Hemosiderin Staining: No Scarring: No Mottled: No Pallor: No Moisture Rubor: No No Abnormalities Noted: No Dry / Scaly: No Temperature / Pain Maceration: No Temperature: No Abnormality Tenderness on Palpation: Yes Wound Preparation Ulcer Cleansing: Rinsed/Irrigated with Saline Topical Anesthetic Applied: Other: lidocaine 4%, Treatment Notes Wound #3 (Left, Lateral Lower Leg) 1. Cleansed with: Clean wound with Normal Saline 2. Anesthetic Topical Lidocaine 4% cream to wound bed prior to debridement 3. Peri-wound Care: Skin Prep 4. Dressing Applied: Hydrafera Blue Mepitel 5. Secondary Dressing Applied ABD and Kerlix/Conform 7. Secured with Tape Other (specify in notes) Notes ACE wrap Electronic Signature(s) Signed: 04/24/2018 4:18:40 PM By: Montey Hora Entered By: Montey Hora on 04/23/2018 08:26:32 [name], [name] ([id number]) -------------------------------------------------------------------------------- Wound Assessment Details Patient Name: [name], [name]. Date of Service: 04/23/2018 8:00 AM Medical Record Number: [id number] Patient Account Number: [id number] Date of Birth/Sex: 26-Jul-1936 (81 y.o. Female) Treating RN: Montey Hora Primary Care Shannara Winbush: Fenton Malling Other Clinician: Referring Karma Hiney: Nance Pear Treating Shamiah Kahler/Extender: Lawanda Cousins Weeks in Treatment: 0 Wound Status Wound Number: 4 Primary Etiology: Skin Tear Wound Location: Left Lower Leg - Medial Wound Status: Open Wounding Event:  Trauma Comorbid History: Hypertension, Osteoarthritis Date Acquired: 04/20/2018 Weeks Of Treatment: 0 Clustered Wound: No Photos Photo Uploaded By: Montey Hora on 04/23/2018 13:55:55 Wound Measurements Length: (cm) 2.1 Width: (cm) 2.5 Depth: (cm) 0.1 Area: (cm) 4.123 Volume: (cm) 0.412 % Reduction in Area: % Reduction in Volume: Epithelialization: None Tunneling: No Undermining: No Wound Description Classification: Partial Thickness Foul O Wound Margin: Flat and Intact Slough Exudate Amount: Medium Exudate Type: Sanguinous Exudate Color: red dor After Cleansing: No /Fibrino No Wound Bed Granulation Amount: Large (67-100%) Exposed Structure Granulation Quality: Red Fascia Exposed: No Necrotic Amount: Small (1-33%) Fat Layer (Subcutaneous Tissue) Exposed: No Necrotic Quality: Adherent Slough Tendon Exposed: No Muscle Exposed: No Joint Exposed: No Bone Exposed: No Periwound Skin Texture Loyd, Fantasy J. ([id number]) Texture Color No Abnormalities Noted: No No Abnormalities Noted: No Callus: No Atrophie Blanche: No Crepitus: No Cyanosis: No Excoriation: No Ecchymosis: No Induration: No Erythema: No Rash: No Hemosiderin Staining: No Scarring: No Mottled: No Pallor: No Moisture Rubor: No No Abnormalities Noted: No Dry / Scaly: No Temperature / Pain Maceration: No Temperature: No Abnormality Tenderness on Palpation: Yes Wound Preparation Ulcer Cleansing: Rinsed/Irrigated with Saline Topical Anesthetic Applied: Other: lidocaine 4%, Treatment Notes Wound #4 (Left, Medial Lower Leg) 1. Cleansed with: Clean wound with Normal Saline 2. Anesthetic Topical Lidocaine 4% cream to wound bed prior to debridement 3. Peri-wound Care: Skin Prep 4. Dressing Applied: Hydrafera Blue Mepitel 5. Secondary Dressing Applied ABD and  wounds INTERVENTIONS - Wound Dressings X - Small Wound Dressing one or multiple wounds 1 10 X- 1 15 Medium Wound Dressing one or multiple wounds []  - 0 Large Wound Dressing one or multiple wounds []  - 0 Application of Medications - injection INTERVENTIONS - Miscellaneous []  - External ear exam 0 []  - 0 Specimen Collection (cultures, biopsies, blood, body fluids, etc.) []  - 0 Specimen(s) / Culture(s) sent or taken to Lab for analysis []  - 0 Patient Transfer (multiple staff / Harrel Lemon Lift / Similar devices) []  - 0 Simple Staple / Suture removal (25 or less) []  - 0 Complex Staple / Suture removal (26 or more) []  - 0 Hypo / Hyperglycemic Management (close monitor of Blood Glucose) X- 1 15 Ankle / Brachial Index (ABI) - do not check if billed separately Has the patient been seen at the hospital within the last three years: Yes Total Score: 160 Level Of Care: New/Established - Level 5 Electronic Signature(s) Signed: 04/24/2018 4:18:40 PM By: Montey Hora Entered By: Montey Hora on 04/23/2018 09:29:16 [name], [name] ([id number]) -------------------------------------------------------------------------------- Encounter Discharge Information Details Patient Name: [name], [name] Date of Service: 04/23/2018 8:00 AM Medical Record Number: [id number] Patient Account Number: [id number] Date of Birth/Sex: [birth year] (81 y.o. Female) Treating RN: Montey Hora Primary Care Erubiel Manasco: Fenton Malling Other Clinician: Referring Antwine Agosto: Nance Pear Treating Saranya Harlin/Extender: Cathie Olden in  Treatment: 0 Encounter Discharge Information Items Discharge Condition: Stable Ambulatory Status: Wheelchair Discharge Destination: Home Transportation: Private Auto Accompanied By: spouse Schedule Follow-up Appointment: Yes Clinical Summary of Care: Electronic Signature(s) Signed: 04/24/2018 4:18:40 PM By: Montey Hora Entered By: Montey Hora on 04/23/2018 09:30:58 [name], [name] ([id number]) -------------------------------------------------------------------------------- Lower Extremity Assessment Details Patient Name: [name], [name]. Date of Service: 04/23/2018 8:00 AM Medical Record Number: [id number] Patient Account Number: [id number] Date of Birth/Sex: [birth year] (81 y.o. Female) Treating RN: Montey Hora Primary Care Charistopher Rumble: Fenton Malling Other Clinician: Referring Mukhtar Shams: Nance Pear Treating Jazzlyn Huizenga/Extender: Lawanda Cousins Weeks in Treatment: 0 Edema Assessment Assessed: [Left: No] [Right: No] Edema: [Left: No] [Right: No] Vascular Assessment Pulses: Dorsalis Pedis Palpable: [Left:Yes] [Right:Yes] Doppler Audible: [Left:Yes] [Right:Yes] Posterior Tibial Palpable: [Left:Yes] [Right:Yes] Doppler Audible: [Left:Yes] [Right:Yes] Extremity colors, hair growth, and conditions: Extremity Color: [Left:Normal] [Right:Normal] Hair Growth on Extremity: [Left:No] [Right:No] Temperature of Extremity: [Left:Cool] [Right:Cool] Capillary Refill: [Left:< 3 seconds] [Right:< 3 seconds] Blood Pressure: Brachial: [Right:152] Dorsalis Pedis: [Left:Dorsalis Pedis: 162] Ankle: Posterior Tibial: [Left:Posterior Tibial: 140] [Right:1.07] Toe Nail Assessment Left: Right: Thick: Yes Yes Discolored: Yes Yes Deformed: No No Improper Length and Hygiene: No No Electronic Signature(s) Signed: 04/24/2018 4:18:40 PM By: Montey Hora Entered By: Montey Hora on 04/23/2018 08:33:27 [name], [name]  ([id number]) -------------------------------------------------------------------------------- Multi Wound Chart Details Patient Name: [name]. Date of Service: 04/23/2018 8:00 AM Medical Record Number: [id number] Patient Account Number: [id number] Date of Birth/Sex: [birth year] (81 y.o. Female) Treating RN: Montey Hora Primary Care Katherine Tout: Fenton Malling Other Clinician: Referring Londell Noll: Nance Pear Treating Briyah Wheelwright/Extender: Cathie Olden in Treatment: 0 Vital Signs Height(in): 60 Pulse(bpm): 17 Weight(lbs): 151 Blood Pressure(mmHg): 147/54 Body Mass Index(BMI): 29 Temperature(F): 98.3 Respiratory Rate 16 (breaths/min): Photos: [2:No Photos] [3:No Photos] [4:No Photos] Wound Location: [2:Right Knee] [3:Left Lower Leg - Lateral] [4:Left Lower Leg - Medial] Wounding Event: [2:Trauma] [3:Trauma] [4:Trauma] Primary Etiology: [2:Skin Tear] [3:Skin Tear] [4:Skin Tear] Comorbid History: [2:Hypertension, Osteoarthritis] [3:Hypertension, Osteoarthritis] [4:Hypertension, Osteoarthritis] Date Acquired: [2:04/20/2018] [3:04/20/2018] [4:04/20/2018] Weeks of Treatment: [2:0] [3:0] [4:0] Wound Status: [2:Open] [3:Open] [4:Open] Measurements L x W x D [2:2x2.3x0.1] [3:12.5x11.4x0.2] [4:2.1x2.5x0.1] (

## 2018-05-02 NOTE — Progress Notes (Signed)
CHIQUETA, LOTTI ([id number]) Visit Report for 04/23/2018 Abuse/Suicide Risk Screen Details Patient Name: [name], GOWING. Date of Service: 04/23/2018 8:00 AM Medical Record Number: [id number] Patient Account Number: [id number] Date of Birth/Sex: December 10, 1935 (82 y.o. Female) Treating RN: Curtis Sites Primary Care Travin Marik: Joycelyn Man Other Clinician: Referring Azaiah Mello: Phineas Semen Treating Shahana Capes/Extender: Kathreen Cosier in Treatment: 0 Abuse/Suicide Risk Screen Items Answer ABUSE/SUICIDE RISK SCREEN: Has anyone close to you tried to hurt or harm you recentlyo No Do you feel uncomfortable with anyone in your familyo No Has anyone forced you do things that you didnot want to doo No Do you have any thoughts of harming yourselfo No Patient displays signs or symptoms of abuse and/or neglect. No Electronic Signature(s) Signed: 04/24/2018 4:18:40 PM By: Curtis Sites Entered By: Curtis Sites on 04/23/2018 08:12:55 Ege, Marcine Matar ([id number]) -------------------------------------------------------------------------------- Activities of Daily Living Details Patient Name: [name], TINES. Date of Service: 04/23/2018 8:00 AM Medical Record Number: [id number] Patient Account Number: [id number] Date of Birth/Sex: 1936-06-15 (82 y.o. Female) Treating RN: Curtis Sites Primary Care Rashaan Wyles: Joycelyn Man Other Clinician: Referring Delyle Weider: Phineas Semen Treating Twala Collings/Extender: Kathreen Cosier in Treatment: 0 Activities of Daily Living Items Answer Activities of Daily Living (Please select one for each item) Drive Automobile Not Able Take Medications Completely Able Use Telephone Completely Able Care for Appearance Completely Able Use Toilet Completely Able Bath / Shower Need Assistance Dress Self Need Assistance Feed Self Completely Able Walk Need Assistance Get In / Out Bed Need Assistance Housework Need Assistance Prepare Meals Need  Assistance Handle Money Completely Able Shop for Self Need Assistance Electronic Signature(s) Signed: 04/24/2018 4:18:40 PM By: Curtis Sites Entered By: Curtis Sites on 04/23/2018 08:13:40 Moodie, Marcine Matar ([id number]) -------------------------------------------------------------------------------- Education Assessment Details Patient Name: [name] Furl Date of Service: 04/23/2018 8:00 AM Medical Record Number: [id number] Patient Account Number: [id number] Date of Birth/Sex: 10/05/1935 (82 y.o. Female) Treating RN: Curtis Sites Primary Care Jacody Beneke: Joycelyn Man Other Clinician: Referring Felita Bump: Phineas Semen Treating Charlize Hathaway/Extender: Kathreen Cosier in Treatment: 0 Primary Learner Assessed: Caregiver caregiver Reason Patient is not Primary Learner: wound location Learning Preferences/Education Level/Primary Language Learning Preference: Explanation, Demonstration Highest Education Level: College or Above Preferred Language: English Cognitive Barrier Assessment/Beliefs Language Barrier: No Translator Needed: No Memory Deficit: No Emotional Barrier: No Cultural/Religious Beliefs Affecting Medical Care: No Physical Barrier Assessment Impaired Vision: No Impaired Hearing: No Decreased Hand dexterity: No Knowledge/Comprehension Assessment Knowledge Level: Medium Comprehension Level: Medium Ability to understand written Medium instructions: Ability to understand verbal Medium instructions: Motivation Assessment Anxiety Level: Calm Cooperation: Cooperative Education Importance: Acknowledges Need Interest in Health Problems: Asks Questions Perception: Coherent Willingness to Engage in Self- Medium Management Activities: Readiness to Engage in Self- Medium Management Activities: Electronic Signature(s) Signed: 04/24/2018 4:18:40 PM By: Curtis Sites Entered By: Curtis Sites on 04/23/2018 08:14:05 LEANORE, PELLICER  ([id number]) -------------------------------------------------------------------------------- Fall Risk Assessment Details Patient Name: [name] Furl Date of Service: 04/23/2018 8:00 AM Medical Record Number: [id number] Patient Account Number: [id number] Date of Birth/Sex: 27-Sep-1935 (82 y.o. Female) Treating RN: Curtis Sites Primary Care Talulah Schirmer: Joycelyn Man Other Clinician: Referring Slyvia Lartigue: Phineas Semen Treating Carlita Whitcomb/Extender: Kathreen Cosier in Treatment: 0 Fall Risk Assessment Items Have you had 2 or more falls in the last 12 monthso 0 No Have you had any fall that resulted in injury in the last 12 monthso 0 Yes FALL RISK ASSESSMENT: History of falling - immediate or within 3 months 25 Yes Secondary diagnosis 0 No Ambulatory aid None/bed rest/wheelchair/nurse 0  No Crutches/cane/walker 15 Yes Furniture 0 No IV Access/Saline Lock 0 No Gait/Training Normal/bed rest/immobile 0 No Weak 10 Yes Impaired 20 Yes Mental Status Oriented to own ability 0 Yes Electronic Signature(s) Signed: 04/24/2018 4:18:40 PM By: Curtis Sites Entered By: Curtis Sites on 04/23/2018 08:14:18 Cyphers, Marcine Matar ([id number]) -------------------------------------------------------------------------------- Foot Assessment Details Patient Name: [name], CUADROS. Date of Service: 04/23/2018 8:00 AM Medical Record Number: [id number] Patient Account Number: [id number] Date of Birth/Sex: Dec 01, 1935 (82 y.o. Female) Treating RN: Curtis Sites Primary Care Gualberto Wahlen: Joycelyn Man Other Clinician: Referring Whit Bruni: Phineas Semen Treating Christie Viscomi/Extender: Bonnell Public Weeks in Treatment: 0 Foot Assessment Items Site Locations + = Sensation present, - = Sensation absent, C = Callus, U = Ulcer R = Redness, W = Warmth, M = Maceration, PU = Pre-ulcerative lesion F = Fissure, S = Swelling, D = Dryness Assessment Right: Left: Other Deformity: No No Prior Foot  Ulcer: No No Prior Amputation: No No Charcot Joint: No No Ambulatory Status: Non-ambulatory Assistance Device: Wheelchair Gait: Steady Electronic Signature(s) Signed: 04/24/2018 4:18:40 PM By: Curtis Sites Entered By: Curtis Sites on 04/23/2018 08:20:43 Jiggetts, Marcine Matar ([id number]) -------------------------------------------------------------------------------- Nutrition Risk Assessment Details Patient Name: [name] Furl. Date of Service: 04/23/2018 8:00 AM Medical Record Number: [id number] Patient Account Number: [id number] Date of Birth/Sex: July 09, 1936 (82 y.o. Female) Treating RN: Curtis Sites Primary Care Emonee Winkowski: Joycelyn Man Other Clinician: Referring Myriah Boggus: Phineas Semen Treating Alga Southall/Extender: Bonnell Public Weeks in Treatment: 0 Height (in): 60 Weight (lbs): 151 Body Mass Index (BMI): 29.5 Nutrition Risk Assessment Items NUTRITION RISK SCREEN: I have an illness or condition that made me change the kind and/or amount of 0 No food I eat I eat fewer than two meals per day 0 No I eat few fruits and vegetables, or milk products 0 No I have three or more drinks of beer, liquor or wine almost every day 0 No I have tooth or mouth problems that make it hard for me to eat 0 No I don't always have enough money to buy the food I need 0 No I eat alone most of the time 0 No I take three or more different prescribed or over-the-counter drugs a day 1 Yes Without wanting to, I have lost or gained 10 pounds in the last six months 0 No I am not always physically able to shop, cook and/or feed myself 0 No Nutrition Protocols Good Risk Protocol 0 No interventions needed Moderate Risk Protocol Electronic Signature(s) Signed: 04/24/2018 4:18:40 PM By: Curtis Sites Entered By: Curtis Sites on 04/23/2018 08:14:23

## 2018-05-05 DIAGNOSIS — S81812D Laceration without foreign body, left lower leg, subsequent encounter: Secondary | ICD-10-CM | POA: Diagnosis not present

## 2018-05-05 DIAGNOSIS — S80211D Abrasion, right knee, subsequent encounter: Secondary | ICD-10-CM | POA: Diagnosis not present

## 2018-05-05 DIAGNOSIS — S81811D Laceration without foreign body, right lower leg, subsequent encounter: Secondary | ICD-10-CM | POA: Diagnosis not present

## 2018-05-05 DIAGNOSIS — S80212D Abrasion, left knee, subsequent encounter: Secondary | ICD-10-CM | POA: Diagnosis not present

## 2018-05-05 DIAGNOSIS — J84112 Idiopathic pulmonary fibrosis: Secondary | ICD-10-CM | POA: Diagnosis not present

## 2018-05-05 DIAGNOSIS — S82001D Unspecified fracture of right patella, subsequent encounter for closed fracture with routine healing: Secondary | ICD-10-CM | POA: Diagnosis not present

## 2018-05-06 DIAGNOSIS — M19072 Primary osteoarthritis, left ankle and foot: Secondary | ICD-10-CM | POA: Diagnosis not present

## 2018-05-06 NOTE — Progress Notes (Addendum)
Terri, [name] ([id number]) Visit Report for 04/23/2018 Chief Complaint Document Details Patient Name: [name], [name]. Date of Service: 04/23/2018 8:00 AM Medical Record Number: [id number] Patient Account Number: [id number] Date of Birth/Sex: 08-30-1936 (81 y.o. F) Treating RN: Ahmed Prima Primary Care Provider: Fenton Malling Other Clinician: Referring Provider: Fenton Malling Treating Provider/Extender: Lawanda Cousins Weeks in Treatment: 0 Information Obtained from: Patient Chief Complaint LLE Electronic Signature(s) Signed: 04/23/2018 8:20:01 AM By: Lawanda Cousins Entered By: Lawanda Cousins on 04/23/2018 08:20:01 KIMYATTA, LECY ([id number]) -------------------------------------------------------------------------------- HPI Details Patient Name: Terri, [name] Date of Service: 04/23/2018 8:00 AM Medical Record Number: [id number] Patient Account Number: [id number] Date of Birth/Sex: 1936-03-12 (81 y.o. F) Treating RN: Ahmed Prima Primary Care Provider: Fenton Malling Other Clinician: Referring Provider: Fenton Malling Treating Provider/Extender: Cathie Olden in Treatment: 0 History of Present Illness HPI Description: 04/23/18-She is seen in initial evaluation for multiple skin tears s/p fall on 8/19. She did sustain a closed fracture of the right patella secondary to that fall. She presented to emergency department on 8/19 for treatment. The left leg laceration was unable to be sutured closed, Steri-Strips were applied and she was treated with knee immobilizer for the patella fracture. She continues on Keflex that was initiated on 8/19. The left leg skin tear flap was cleansed and reapproximated, secure with steri strips. She will follow up next week Electronic Signature(s) Signed: 05/07/2018 8:01:40 PM By: Lawanda Cousins Previous Signature: 04/23/2018 9:07:04 AM Version By: Lawanda Cousins Previous Signature: 04/23/2018 8:19:49 AM Version By:  Lawanda Cousins Entered By: Lawanda Cousins on 05/07/2018 20:01:40 NIKCOLE, EISCHEID ([id number]) -------------------------------------------------------------------------------- Physical Exam Details Patient Name: Terri, [name]. Date of Service: 04/23/2018 8:00 AM Medical Record Number: [id number] Patient Account Number: [id number] Date of Birth/Sex: [birth year] (81 y.o. F) Treating RN: Ahmed Prima Primary Care Provider: Fenton Malling Other Clinician: Referring Provider: Fenton Malling Treating Provider/Extender: Lawanda Cousins Weeks in Treatment: 0 Respiratory respirations are even and unlabored. clear throughout. Cardiovascular s1 s2 regular rate and rhythm. LLE- palpable DP, PT. LLE- eccymosis, warm to touch, cap refill <3s. Gastrointestinal (GI) abs x4; soft, non-tender. Musculoskeletal right knee immobilizer; presents in wheelchair. Psychiatric appears to have appropriate insight and judgement to medical care. oriented x4. calm, cooperative. Electronic Signature(s) Signed: 04/23/2018 9:08:40 AM By: Lawanda Cousins Entered By: Lawanda Cousins on 04/23/2018 09:08:40 ANGELYSE, HESLIN ([id number]) -------------------------------------------------------------------------------- Physician Orders Details Patient Name: Terri, [name] Date of Service: 04/23/2018 8:00 AM Medical Record Number: [id number] Patient Account Number: [id number] Date of Birth/Sex: 1936/07/14 (81 y.o. F) Treating RN: Montey Hora Primary Care Provider: Fenton Malling Other Clinician: Referring Provider: Fenton Malling Treating Provider/Extender: Cathie Olden in Treatment: 0 Verbal / Phone Orders: No Diagnosis Coding ICD-10 Coding Code Description [phone] Laceration without foreign body, left lower leg, sequela S80.212S Abrasion, left knee, sequela S80.211S Abrasion, right knee, sequela Wound Cleansing Wound #2 Right Knee o Clean wound with Normal Saline. Wound #3  Left,Lateral Lower Leg o Clean wound with Normal Saline. Wound #4 Left,Medial Lower Leg o Clean wound with Normal Saline. Anesthetic (add to Medication List) Wound #2 Right Knee o Topical Lidocaine 4% cream applied to wound bed prior to debridement (In Clinic Only). Wound #3 Left,Lateral Lower Leg o Topical Lidocaine 4% cream applied to wound bed prior to debridement (In Clinic Only). Wound #4 Left,Medial Lower Leg o Topical Lidocaine 4% cream applied to wound bed prior to debridement (In Clinic Only). Skin Barriers/Peri-Wound Care Wound #2 Right Knee o Skin Prep Wound #3 Left,Lateral Lower Leg   o Skin Prep Wound #4 Left,Medial Lower Leg o Skin Prep Primary Wound Dressing Wound #2 Right Knee o Boardered Foam Dressing Wound #3 Left,Lateral Lower Leg o Mepitel One Contact layer o Hydrafera Blue Ready Transfer SALLEE, HOGREFE ([id number]) Wound #4 Left,Medial Lower Leg o Mepitel One Contact layer o Hydrafera Blue Ready Transfer Secondary Dressing Wound #3 Left,Lateral Lower Leg o ABD and Kerlix/Conform Wound #4 Left,Medial Lower Leg o ABD and Kerlix/Conform Dressing Change Frequency Wound #2 Right Knee o Change dressing every week Wound #3 Left,Lateral Lower Leg o Change dressing every week Wound #4 Left,Medial Lower Leg o Change dressing every week Follow-up Appointments Wound #2 Right Knee o Return Appointment in 1 week. Wound #3 Left,Lateral Lower Leg o Return Appointment in 1 week. Wound #4 Left,Medial Lower Leg o Return Appointment in 1 week. Edema Control Wound #2 Right Knee o Elevate legs to the level of the heart and pump ankles as often as possible o Other: - ACE wrap Wound #3 Left,Lateral Lower Leg o Elevate legs to the level of the heart and pump ankles as often as possible o Other: - ACE wrap Wound #4 Left,Medial Lower Leg o Elevate legs to the level of the heart and pump ankles as often as  possible o Other: - ACE wrap Additional Orders / Instructions Wound #2 Right Knee o Vitamin A; Vitamin C, Zinc - Please add a multivitamin that has 100% of vitamin A, vitamin C and zinc supplements in it o Increase protein intake. o Activity as tolerated Wound #3 Left,Lateral Lower Leg o Vitamin A; Vitamin C, Zinc - Please add a multivitamin that has 100% of vitamin A, vitamin C and zinc supplements in it ZURRI, RUDDEN. ([id number]) o Increase protein intake. o Activity as tolerated Wound #4 Left,Medial Lower Leg o Vitamin A; Vitamin C, Zinc - Please add a multivitamin that has 100% of vitamin A, vitamin C and zinc supplements in it o Increase protein intake. o Activity as tolerated Electronic Signature(s) Signed: 05/05/2018 5:47:54 PM By: Lawanda Cousins Entered By: Lawanda Cousins on 04/23/2018 09:09:54 JAYANNA, KROEGER ([id number]) -------------------------------------------------------------------------------- Problem List Details Patient Name: JONNY, LONGINO. Date of Service: 04/23/2018 8:00 AM Medical Record Number: [id number] Patient Account Number: [id number] Date of Birth/Sex: Jan 25, 1936 (81 y.o. F) Treating RN: Ahmed Prima Primary Care Provider: Fenton Malling Other Clinician: Referring Provider: Fenton Malling Treating Provider/Extender: Cathie Olden in Treatment: 0 Active Problems ICD-10 Evaluated Encounter Code Description Active Date Today Diagnosis S81.812S Laceration without foreign body, left lower leg, sequela 04/23/2018 No Yes S80.212S Abrasion, left knee, sequela 04/23/2018 No Yes S80.211S Abrasion, right knee, sequela 04/23/2018 No Yes Inactive Problems Resolved Problems Electronic Signature(s) Signed: 04/23/2018 9:05:48 AM By: Lawanda Cousins Previous Signature: 04/23/2018 8:20:28 AM Version By: Lawanda Cousins Entered By: Lawanda Cousins on 04/23/2018 09:05:48 [place], [name]  ([id number]) -------------------------------------------------------------------------------- Progress Note Details Patient Name: [name]. Date of Service: 04/23/2018 8:00 AM Medical Record Number: [id number] Patient Account Number: [id number] Date of Birth/Sex: Jan 09, 1936 (81 y.o. F) Treating RN: Primary Care Provider: Fenton Malling Other Clinician: Referring Provider: Fenton Malling Treating Provider/Extender: Cathie Olden in Treatment: 0 Subjective Chief Complaint Information obtained from Patient LLE History of Present Illness (HPI) 04/23/18-She is seen in initial evaluation for multiple skin tears s/p fall on 8/19. She did sustain a closed fracture of the right patella secondary to that fall. She presented to emergency department on 8/19 for treatment. The left leg laceration was unable to be sutured closed, Steri-Strips were applied  and she was treated with knee immobilizer for the patella fracture. She continues on Keflex that was initiated on 8/19. The left leg skin tear flap was cleansed and reapproximated, secure with steri strips. She will follow up next week Wound History Patient presents with 2 open wounds that have been present for approximately 4 days. Patient has been treating wounds in the following manner: bandage from the ER. Laboratory tests have been performed in the last month. Patient reportedly has not tested positive for an antibiotic resistant organism. Patient reportedly has not tested positive for osteomyelitis. Patient reportedly has not had testing performed to evaluate circulation in the legs. Patient experiences the following problems associated with their wounds: swelling. Patient History Information obtained from Patient. Allergies No Known Allergies Family History Cancer - Mother, Lung Disease - Siblings, No family history of Diabetes, Heart Disease, Hereditary Spherocytosis, Hypertension, Kidney Disease, Seizures,  Stroke, Thyroid Problems, Tuberculosis. Social History Never smoker, Marital Status - Married, Alcohol Use - Never, Drug Use - No History, Caffeine Use - Daily. Medical History Eyes Denies history of Cataracts, Glaucoma, Optic Neuritis Ear/Nose/Mouth/Throat Denies history of Chronic sinus problems/congestion, Middle ear problems Hematologic/Lymphatic Denies history of Anemia, Hemophilia, Human Immunodeficiency Virus, Lymphedema, Sickle Cell Disease Respiratory Denies history of Aspiration, Asthma, Chronic Obstructive Pulmonary Disease (COPD), Pneumothorax, Sleep Apnea, Tuberculosis Cardiovascular Patient has history of Hypertension SEDONIA, KITNER ([id number]) Denies history of Angina, Arrhythmia, Congestive Heart Failure, Coronary Artery Disease, Deep Vein Thrombosis, Hypotension, Myocardial Infarction, Peripheral Arterial Disease, Peripheral Venous Disease, Phlebitis, Vasculitis Gastrointestinal Denies history of Cirrhosis , Colitis, Crohn s, Hepatitis A, Hepatitis B, Hepatitis C Endocrine Denies history of Type I Diabetes, Type II Diabetes Genitourinary Denies history of End Stage Renal Disease Immunological Denies history of Lupus Erythematosus, Raynaud s, Scleroderma Integumentary (Skin) Denies history of History of Burn, History of pressure wounds Musculoskeletal Patient has history of Osteoarthritis Denies history of Gout, Rheumatoid Arthritis, Osteomyelitis Neurologic Denies history of Dementia, Neuropathy, Quadriplegia, Paraplegia, Seizure Disorder Oncologic Denies history of Received Chemotherapy, Received Radiation Psychiatric Denies history of Anorexia/bulimia, Confinement Anxiety Medical And Surgical History Notes Respiratory pulmonary fibrosis, home O2 at night and PRN Gastrointestinal diverticulosis Review of Systems (ROS) Constitutional Symptoms (General Health) The patient has no complaints or symptoms. Eyes Complains or has symptoms of Glasses /  Contacts - glasses. Denies complaints or symptoms of Dry Eyes, Vision Changes. Ear/Nose/Mouth/Throat Denies complaints or symptoms of Difficult clearing ears, Sinusitis. Hematologic/Lymphatic Denies complaints or symptoms of Bleeding / Clotting Disorders, Human Immunodeficiency Virus. Respiratory Denies complaints or symptoms of Chronic or frequent coughs, Shortness of Breath. Cardiovascular Complains or has symptoms of LE edema. Denies complaints or symptoms of Chest pain. Gastrointestinal Denies complaints or symptoms of Frequent diarrhea, Nausea, Vomiting. Endocrine Denies complaints or symptoms of Hepatitis, Thyroid disease, Polydypsia (Excessive Thirst). Genitourinary Denies complaints or symptoms of Kidney failure/ Dialysis, Incontinence/dribbling. Immunological Denies complaints or symptoms of Hives, Itching. Integumentary (Skin) Complains or has symptoms of Wounds. Denies complaints or symptoms of Bleeding or bruising tendency, Breakdown, Swelling. Musculoskeletal Denies complaints or symptoms of Muscle Pain, Muscle Weakness. Neurologic Denies complaints or symptoms of Numbness/parasthesias, Focal/Weakness. Psychiatric Complains or has symptoms of Anxiety. JAMILLAH, CAMILO ([id number]) Denies complaints or symptoms of Claustrophobia. Objective Constitutional Vitals Time Taken: 8:09 AM, Height: 60 in, Source: Measured, Weight: 151 lbs, Source: Measured, BMI: 29.5, Temperature: 98.3 F, Pulse: 62 bpm, Respiratory Rate: 16 breaths/min, Blood Pressure: 147/54 mmHg. Respiratory respirations are even and unlabored. clear throughout. Cardiovascular s1 s2 regular rate and rhythm. LLE- palpable  DP, PT. LLE- eccymosis, warm to touch, cap refill <3s. Gastrointestinal (GI) abs x4; soft, non-tender. Musculoskeletal right knee immobilizer; presents in wheelchair. Psychiatric appears to have appropriate insight and judgement to medical care. oriented x4. calm,  cooperative. Integumentary (Hair, Skin) Wound #2 status is Open. Original cause of wound was Trauma. The wound is located on the Right Knee. The wound measures 2cm length x 2.3cm width x 0.1cm depth; 3.613cm^2 area and 0.361cm^3 volume. There is no tunneling or undermining noted. There is a small amount of serous drainage noted. The wound margin is flat and intact. There is medium (34-66%) red granulation within the wound bed. There is a medium (34-66%) amount of necrotic tissue within the wound bed including Eschar and Adherent Slough. The periwound skin appearance did not exhibit: Callus, Crepitus, Excoriation, Induration, Rash, Scarring, Dry/Scaly, Maceration, Atrophie Blanche, Cyanosis, Ecchymosis, Hemosiderin Staining, Mottled, Pallor, Rubor, Erythema. Periwound temperature was noted as No Abnormality. The periwound has tenderness on palpation. Wound #3 status is Open. Original cause of wound was Trauma. The wound is located on the Left,Lateral Lower Leg. The wound measures 12.5cm length x 11.4cm width x 0.2cm depth; 111.919cm^2 area and 22.384cm^3 volume. There is Fat Layer (Subcutaneous Tissue) Exposed exposed. There is no tunneling or undermining noted. There is a large amount of sanguinous drainage noted. The wound margin is flat and intact. There is large (67-100%) red granulation within the wound bed. There is a small (1-33%) amount of necrotic tissue within the wound bed including Adherent Slough. The periwound skin appearance did not exhibit: Callus, Crepitus, Excoriation, Induration, Rash, Scarring, Dry/Scaly, Maceration, Atrophie Blanche, Cyanosis, Ecchymosis, Hemosiderin Staining, Mottled, Pallor, Rubor, Erythema. Periwound temperature was noted as No Abnormality. The periwound has tenderness on palpation. Wound #4 status is Open. Original cause of wound was Trauma. The wound is located on the Left,Medial Lower Leg. The wound measures 2.1cm length x 2.5cm width x 0.1cm depth;  4.123cm^2 area and 0.412cm^3 volume. There is no tunneling or undermining noted. There is a medium amount of sanguinous drainage noted. The wound margin is flat and intact. There is large (67-100%) red granulation within the wound bed. There is a small (1-33%) amount of necrotic tissue within the wound bed including Adherent Slough. The periwound skin appearance did not exhibit: Callus, Crepitus, Excoriation, Induration, Rash, Scarring, Dry/Scaly, Maceration, Atrophie Blanche, Cyanosis, Ecchymosis, Hemosiderin Staining, Mottled, Pallor, Giron, Dartha J. ([id number]) Rubor, Erythema. Periwound temperature was noted as No Abnormality. The periwound has tenderness on palpation. Assessment Active Problems ICD-10 Laceration without foreign body, left lower leg, sequela Abrasion, left knee, sequela Abrasion, right knee, sequela Plan Wound Cleansing: Wound #2 Right Knee: Clean wound with Normal Saline. Wound #3 Left,Lateral Lower Leg: Clean wound with Normal Saline. Wound #4 Left,Medial Lower Leg: Clean wound with Normal Saline. Anesthetic (add to Medication List): Wound #2 Right Knee: Topical Lidocaine 4% cream applied to wound bed prior to debridement (In Clinic Only). Wound #3 Left,Lateral Lower Leg: Topical Lidocaine 4% cream applied to wound bed prior to debridement (In Clinic Only). Wound #4 Left,Medial Lower Leg: Topical Lidocaine 4% cream applied to wound bed prior to debridement (In Clinic Only). Skin Barriers/Peri-Wound Care: Wound #2 Right Knee: Skin Prep Wound #3 Left,Lateral Lower Leg: Skin Prep Wound #4 Left,Medial Lower Leg: Skin Prep Primary Wound Dressing: Wound #2 Right Knee: Boardered Foam Dressing Wound #3 Left,Lateral Lower Leg: Mepitel One Contact layer Hydrafera Blue Ready Transfer Wound #4 Left,Medial Lower Leg: Mepitel One Contact layer Hydrafera Blue Ready Transfer Secondary Dressing: Wound #  3 Left,Lateral Lower Leg: ABD and Kerlix/Conform Wound #4  Left,Medial Lower Leg: ABD and Kerlix/Conform Dressing Change Frequency: Wound #2 Right Knee: YESICA, KEMLER ([id number]) Change dressing every week Wound #3 Left,Lateral Lower Leg: Change dressing every week Wound #4 Left,Medial Lower Leg: Change dressing every week Follow-up Appointments: Wound #2 Right Knee: Return Appointment in 1 week. Wound #3 Left,Lateral Lower Leg: Return Appointment in 1 week. Wound #4 Left,Medial Lower Leg: Return Appointment in 1 week. Edema Control: Wound #2 Right Knee: Elevate legs to the level of the heart and pump ankles as often as possible Other: - ACE wrap Wound #3 Left,Lateral Lower Leg: Elevate legs to the level of the heart and pump ankles as often as possible Other: - ACE wrap Wound #4 Left,Medial Lower Leg: Elevate legs to the level of the heart and pump ankles as often as possible Other: - ACE wrap Additional Orders / Instructions: Wound #2 Right Knee: Vitamin A; Vitamin C, Zinc - Please add a multivitamin that has 100% of vitamin A, vitamin C and zinc supplements in it Increase protein intake. Activity as tolerated Wound #3 Left,Lateral Lower Leg: Vitamin A; Vitamin C, Zinc - Please add a multivitamin that has 100% of vitamin A, vitamin C and zinc supplements in it Increase protein intake. Activity as tolerated Wound #4 Left,Medial Lower Leg: Vitamin A; Vitamin C, Zinc - Please add a multivitamin that has 100% of vitamin A, vitamin C and zinc supplements in it Increase protein intake. Activity as tolerated Electronic Signature(s) Signed: 05/07/2018 8:02:04 PM By: Lawanda Cousins Previous Signature: 04/23/2018 9:10:03 AM Version By: Lawanda Cousins Entered By: Lawanda Cousins on 05/07/2018 20:02:03 AURIANNA, EARLYWINE ([id number]) -------------------------------------------------------------------------------- ROS/PFSH Details Patient Name: BERTINA, GUTHRIDGE. Date of Service: 04/23/2018 8:00 AM Medical Record Number:  [id number] Patient Account Number: [id number] Date of Birth/Sex: July 21, 1936 (81 y.o. F) Treating RN: Montey Hora Primary Care Provider: Fenton Malling Other Clinician: Referring Provider: Fenton Malling Treating Provider/Extender: Cathie Olden in Treatment: 0 Information Obtained From Patient Wound History Do you currently have one or more open woundso Yes How many open wounds do you currently haveo 2 Approximately how long have you had your woundso 4 days How have you been treating your wound(s) until nowo bandage from the ER Has your wound(s) ever healed and then re-openedo No Have you had any lab work done in the past montho Yes Who ordered the lab work [facility] Have you tested positive for an antibiotic resistant organism (MRSA, VRE)o No Have you tested positive for osteomyelitis (bone infection)o No Have you had any tests for circulation on your legso No Have you had other problems associated with your woundso Swelling Constitutional Symptoms (General Health) Complaints and Symptoms: No Complaints or Symptoms Complaints and Symptoms: Negative for: Fatigue; Fever; Chills; Marked Weight Change Eyes Complaints and Symptoms: Positive for: Glasses / Contacts - glasses Negative for: Dry Eyes; Vision Changes Medical History: Negative for: Cataracts; Glaucoma; Optic Neuritis Ear/Nose/Mouth/Throat Complaints and Symptoms: Negative for: Difficult clearing ears; Sinusitis Medical History: Negative for: Chronic sinus problems/congestion; Middle ear problems Hematologic/Lymphatic Complaints and Symptoms: Negative for: Bleeding / Clotting Disorders; Human Immunodeficiency Virus Medical History: Negative for: Anemia; Hemophilia; Human Immunodeficiency Virus; Lymphedema; Sickle Cell Disease Respiratory MEAH, JIRON. ([id number]) Complaints and Symptoms: Negative for: Chronic or frequent coughs; Shortness of Breath Medical History: Negative for:  Aspiration; Asthma; Chronic Obstructive Pulmonary Disease (COPD); Pneumothorax; Sleep Apnea; Tuberculosis Past Medical History Notes: pulmonary fibrosis, home O2 at night and PRN Cardiovascular Complaints and  Symptoms: Positive for: LE edema Negative for: Chest pain Medical History: Positive for: Hypertension Negative for: Angina; Arrhythmia; Congestive Heart Failure; Coronary Artery Disease; Deep Vein Thrombosis; Hypotension; Myocardial Infarction; Peripheral Arterial Disease; Peripheral Venous Disease; Phlebitis; Vasculitis Gastrointestinal Complaints and Symptoms: Negative for: Frequent diarrhea; Nausea; Vomiting Medical History: Negative for: Cirrhosis ; Colitis; Crohnos; Hepatitis A; Hepatitis B; Hepatitis C Past Medical History Notes: diverticulosis Endocrine Complaints and Symptoms: Negative for: Hepatitis; Thyroid disease; Polydypsia (Excessive Thirst) Medical History: Negative for: Type I Diabetes; Type II Diabetes Genitourinary Complaints and Symptoms: Negative for: Kidney failure/ Dialysis; Incontinence/dribbling Medical History: Negative for: End Stage Renal Disease Immunological Complaints and Symptoms: Negative for: Hives; Itching Medical History: Negative for: Lupus Erythematosus; Raynaudos; Scleroderma Integumentary (Skin) Complaints and Symptoms: Positive for: Wounds Negative for: Bleeding or bruising tendency; Breakdown; Swelling ZENITA, KISTER. ([id number]) Medical History: Negative for: History of Burn; History of pressure wounds Musculoskeletal Complaints and Symptoms: Negative for: Muscle Pain; Muscle Weakness Medical History: Positive for: Osteoarthritis Negative for: Gout; Rheumatoid Arthritis; Osteomyelitis Neurologic Complaints and Symptoms: Negative for: Numbness/parasthesias; Focal/Weakness Medical History: Negative for: Dementia; Neuropathy; Quadriplegia; Paraplegia; Seizure Disorder Psychiatric Complaints and Symptoms: Positive  for: Anxiety Negative for: Claustrophobia Medical History: Negative for: Anorexia/bulimia; Confinement Anxiety Oncologic Medical History: Negative for: Received Chemotherapy; Received Radiation Immunizations Pneumococcal Vaccine: Received Pneumococcal Vaccination: Yes Implantable Devices Family and Social History Cancer: Yes - Mother; Diabetes: No; Heart Disease: No; Hereditary Spherocytosis: No; Hypertension: No; Kidney Disease: No; Lung Disease: Yes - Siblings; Seizures: No; Stroke: No; Thyroid Problems: No; Tuberculosis: No; Never smoker; Marital Status - Married; Alcohol Use: Never; Drug Use: No History; Caffeine Use: Daily; Financial Concerns: No; Food, Clothing or Shelter Needs: No; Support System Lacking: No; Transportation Concerns: No; Advanced Directives: No; Patient does not want information on Advanced Directives Electronic Signature(s) Signed: 04/24/2018 4:18:40 PM By: Montey Hora Signed: 05/05/2018 5:47:54 PM By: Lawanda Cousins Entered By: Montey Hora on 04/23/2018 08:19:49 MAKILA, COLOMBE ([id number]) -------------------------------------------------------------------------------- [place] Details Patient Name: JESSINA, MARSE. Date of Service: 04/23/2018 Medical Record Number: [id number] Patient Account Number: [id number] Date of Birth/Sex: Nov 27, 1935 (82 y.o. F) Treating RN: Ahmed Prima Primary Care Provider: Fenton Malling Other Clinician: Referring Provider: Fenton Malling Treating Provider/Extender: Lawanda Cousins Weeks in Treatment: 0 Diagnosis Coding ICD-10 Codes Code Description [phone] Laceration without foreign body, left lower leg, sequela S80.212S Abrasion, left knee, sequela S80.211S Abrasion, right knee, sequela Facility Procedures CPT4 Code: [id number] Description: [phone] - WOUND CARE VISIT-LEV 5 EST PT Modifier: Quantity: 1 Physician Procedures CPT4 Code: [id number] Description: WC PHYS LEVEL 3 o NEW PT ICD-10 Diagnosis  Description S81.812S Laceration without foreign body, left lower leg, sequela S80.212S Abrasion, left knee, sequela S80.211S Abrasion, right knee, sequela Modifier: Quantity: 1 Electronic Signature(s) Signed: 04/24/2018 4:18:40 PM By: Montey Hora Signed: 05/05/2018 5:47:54 PM By: Lawanda Cousins Previous Signature: 04/23/2018 9:10:19 AM Version By: Lawanda Cousins Entered By: Montey Hora on 04/23/2018 79:15:05

## 2018-05-07 ENCOUNTER — Encounter: Payer: Medicare Other | Attending: Nurse Practitioner | Admitting: Nurse Practitioner

## 2018-05-07 DIAGNOSIS — J8410 Pulmonary fibrosis, unspecified: Secondary | ICD-10-CM | POA: Diagnosis not present

## 2018-05-07 DIAGNOSIS — W19XXXS Unspecified fall, sequela: Secondary | ICD-10-CM | POA: Insufficient documentation

## 2018-05-07 DIAGNOSIS — Z9981 Dependence on supplemental oxygen: Secondary | ICD-10-CM | POA: Insufficient documentation

## 2018-05-07 DIAGNOSIS — S81812S Laceration without foreign body, left lower leg, sequela: Secondary | ICD-10-CM | POA: Insufficient documentation

## 2018-05-07 DIAGNOSIS — S80211S Abrasion, right knee, sequela: Secondary | ICD-10-CM | POA: Diagnosis not present

## 2018-05-07 DIAGNOSIS — S81812A Laceration without foreign body, left lower leg, initial encounter: Secondary | ICD-10-CM | POA: Diagnosis not present

## 2018-05-07 DIAGNOSIS — Z8719 Personal history of other diseases of the digestive system: Secondary | ICD-10-CM | POA: Diagnosis not present

## 2018-05-07 DIAGNOSIS — I10 Essential (primary) hypertension: Secondary | ICD-10-CM | POA: Diagnosis not present

## 2018-05-07 DIAGNOSIS — M1990 Unspecified osteoarthritis, unspecified site: Secondary | ICD-10-CM | POA: Insufficient documentation

## 2018-05-07 DIAGNOSIS — S80212S Abrasion, left knee, sequela: Secondary | ICD-10-CM | POA: Diagnosis not present

## 2018-05-09 DIAGNOSIS — S81811D Laceration without foreign body, right lower leg, subsequent encounter: Secondary | ICD-10-CM | POA: Diagnosis not present

## 2018-05-09 DIAGNOSIS — S80212D Abrasion, left knee, subsequent encounter: Secondary | ICD-10-CM | POA: Diagnosis not present

## 2018-05-09 DIAGNOSIS — S82001D Unspecified fracture of right patella, subsequent encounter for closed fracture with routine healing: Secondary | ICD-10-CM | POA: Diagnosis not present

## 2018-05-09 DIAGNOSIS — S80211D Abrasion, right knee, subsequent encounter: Secondary | ICD-10-CM | POA: Diagnosis not present

## 2018-05-09 DIAGNOSIS — S81812D Laceration without foreign body, left lower leg, subsequent encounter: Secondary | ICD-10-CM | POA: Diagnosis not present

## 2018-05-09 DIAGNOSIS — J84112 Idiopathic pulmonary fibrosis: Secondary | ICD-10-CM | POA: Diagnosis not present

## 2018-05-10 NOTE — Progress Notes (Signed)
[name], [name] ([id number]) Visit Report for 05/07/2018 Arrival Information Details Patient Name: [name], [name]. Date of Service: 05/07/2018 10:15 AM Medical Record Number: [id number] Patient Account Number: [id number] Date of Birth/Sex: 02/09/1936 (82 y.o. F) Treating RN: Montey Hora Primary Care Caidyn Blossom: Fenton Malling Other Clinician: Referring Merdith Boyd: Fenton Malling Treating Dovber Ernest/Extender: Cathie Olden in Treatment: 2 Visit Information History Since Last Visit Added or deleted any medications: No Patient Arrived: Cane Any new allergies or adverse reactions: No Arrival Time: 10:18 Had a fall or experienced change in No Accompanied By: husband activities of daily living that may affect Transfer Assistance: None risk of falls: Patient Identification Verified: Yes Signs or symptoms of abuse/neglect since last visito No Secondary Verification Process Completed: Yes Hospitalized since last visit: No Implantable device outside of the clinic excluding No cellular tissue based products placed in the center since last visit: Has Dressing in Place as Prescribed: Yes Has Compression in Place as Prescribed: Yes Pain Present Now: No Electronic Signature(s) Signed: 05/07/2018 5:08:53 PM By: Montey Hora Entered By: Montey Hora on 05/07/2018 10:19:05 [name] ([id number]) -------------------------------------------------------------------------------- Encounter Discharge Information Details Patient Name: [name], [name]. Date of Service: 05/07/2018 10:15 AM Medical Record Number: [id number] Patient Account Number: [id number] Date of Birth/Sex: 04/07/1936 (82 y.o. F) Treating RN: Roger Shelter Primary Care Kian Ottaviano: Fenton Malling Other Clinician: Referring Renell Allum: Fenton Malling Treating Saryah Loper/Extender: Cathie Olden in Treatment: 2 Encounter Discharge Information Items Discharge Condition: Stable Ambulatory Status:  Ambulatory Discharge Destination: Home Transportation: Private Auto Accompanied By: husband Schedule Follow-up Appointment: Yes Clinical Summary of Care: Electronic Signature(s) Signed: 05/07/2018 2:41:32 PM By: Roger Shelter Entered By: Roger Shelter on 05/07/2018 11:21:07 [name] ([id number]) -------------------------------------------------------------------------------- Lower Extremity Assessment Details Patient Name: [name], [name]. Date of Service: 05/07/2018 10:15 AM Medical Record Number: [id number] Patient Account Number: [id number] Date of Birth/Sex: 06/13/1936 (82 y.o. F) Treating RN: Montey Hora Primary Care Tramaine Sauls: Fenton Malling Other Clinician: Referring Aubre Quincy: Fenton Malling Treating Harsimran Westman/Extender: Cathie Olden in Treatment: 2 Edema Assessment Assessed: [Left: No] [Right: No] [Left: Edema] [Right: :] Calf Left: Right: Point of Measurement: 30 cm From Medial Instep 33.2 cm 30.3 cm Ankle Left: Right: Point of Measurement: 10 cm From Medial Instep 22.3 cm 19.7 cm Vascular Assessment Pulses: Dorsalis Pedis Palpable: [Left:Yes] [Right:Yes] Posterior Tibial Extremity colors, hair growth, and conditions: Extremity Color: [Left:Mottled] [Right:Mottled] Temperature of Extremity: [Left:Hot] [Right:Warm] Capillary Refill: [Left:< 3 seconds] [Right:< 3 seconds] Toe Nail Assessment Left: Right: Thick: Yes Yes Discolored: No No Deformed: No No Improper Length and Hygiene: No No Electronic Signature(s) Signed: 05/07/2018 5:08:53 PM By: Montey Hora Entered By: Montey Hora on 05/07/2018 10:28:21 [name], [name] ([id number]) -------------------------------------------------------------------------------- Multi Wound Chart Details Patient Name: [name]. Date of Service: 05/07/2018 10:15 AM Medical Record Number: [id number] Patient Account Number: [id number] Date of Birth/Sex: 1936/01/08 (82 y.o. F) Treating  RN: Roger Shelter Primary Care Tareek Sabo: Fenton Malling Other Clinician: Referring Cela Newcom: Fenton Malling Treating Naomi Castrogiovanni/Extender: Cathie Olden in Treatment: 2 Vital Signs Height(in): 60 Pulse(bpm): 60 Weight(lbs): 151 Blood Pressure(mmHg): 144/55 Body Mass Index(BMI): 29 Temperature(F): 98.2 Respiratory Rate 16 (breaths/min): Photos: [2:No Photos] [3:No Photos] [4:No Photos] Wound Location: [2:Right Knee] [3:Left Lower Leg - Lateral] [4:Left Lower Leg - Medial] Wounding Event: [2:Trauma] [3:Trauma] [4:Trauma] Primary Etiology: [2:Skin Tear] [3:Skin Tear] [4:Skin Tear] Comorbid History: [2:Hypertension, Osteoarthritis] [3:Hypertension, Osteoarthritis] [4:Hypertension, Osteoarthritis] Date Acquired: [2:04/20/2018] [3:04/20/2018] [4:04/20/2018] Weeks of Treatment: [2:2] [3:2] [4:2] Wound Status: [2:Healed - Epithelialized] [3:Open] [4:Open] Measurements L x W x  Status: Healed - Epithelialized Wounding Event: Gradually Appeared Comorbid History: Hypertension, Osteoarthritis Date Acquired: 04/27/2018 Weeks Of Treatment: 1 Clustered Wound:  No Photos Photo Uploaded By: Montey Hora on 05/07/2018 13:00:27 Wound Measurements Length: (cm) 0 Width: (cm) 0 Depth: (cm) 0 Area: (cm) 0 Volume: (cm) 0 % Reduction in Area: 100% % Reduction in Volume: 100% Epithelialization: Small (1-33%) Tunneling: No Undermining: No Wound Description Classification: Partial Thickness Foul O Wound Margin: Flat and Intact Slough Exudate Amount: Large Exudate Type: Serous Exudate Color: amber dor After Cleansing: No /Fibrino Yes Wound Bed Granulation Amount: None Present (0%) Exposed Structure Necrotic Amount: Large (67-100%) Fascia Exposed: No Necrotic Quality: Adherent Slough Fat Layer (Subcutaneous Tissue) Exposed: No Tendon Exposed: No Muscle Exposed: No Joint Exposed: No Bone Exposed: No Periwound Skin Texture [name], Terri J. ([id number]) Texture Color No Abnormalities Noted: No No Abnormalities Noted: No Callus: No Atrophie Blanche: No Crepitus: No Cyanosis: No Excoriation: No Ecchymosis: No Induration: No Erythema: No Rash: No Hemosiderin Staining: No Scarring: No Mottled: No Pallor: No Moisture Rubor: No No Abnormalities Noted: No Dry / Scaly: No Temperature / Pain Maceration: No Temperature: No Abnormality Tenderness on Palpation: Yes Wound Preparation Ulcer Cleansing: Rinsed/Irrigated with Saline Topical Anesthetic Applied: Other: lidocaine 4%, Electronic Signature(s) Signed: 05/07/2018 2:41:32 PM By: Roger Shelter Entered By: Roger Shelter on 05/07/2018 10:46:08 [name], [name] ([id number]) -------------------------------------------------------------------------------- Wound Assessment Details Patient Name: [name], [name]. Date of Service: 05/07/2018 10:15 AM Medical Record Number: [id number] Patient Account Number: [id number] Date of Birth/Sex: 11-22-1935 (82 y.o. F) Treating RN: Roger Shelter Primary Care Anaiz Qazi: Fenton Malling Other Clinician: Referring Alonnah Lampkins:  Fenton Malling Treating Mindy Gali/Extender: Lawanda Cousins Weeks in Treatment: 2 Wound Status Wound Number: 6 Primary Etiology: Trauma, Other Wound Location: Left Knee Wound Status: Healed - Epithelialized Wounding Event: Trauma Comorbid History: Hypertension, Osteoarthritis Date Acquired: 04/20/2018 Weeks Of Treatment: 1 Clustered Wound: No Photos Photo Uploaded By: Montey Hora on 05/07/2018 13:00:47 Wound Measurements Length: (cm) 0 % Redu Width: (cm) 0 % Redu Depth: (cm) 0 Epithe Area: (cm) 0 Tunne Volume: (cm) 0 Under ction in Area: 100% ction in Volume: 100% lialization: None ling: No mining: No Wound Description Full Thickness Without Exposed Support Foul O Classification: Structures Slough Wound Margin: Flat and Intact Exudate None Present Amount: dor After Cleansing: No /Fibrino No Wound Bed Granulation Amount: None Present (0%) Exposed Structure Necrotic Amount: Large (67-100%) Fascia Exposed: No Necrotic Quality: Eschar Fat Layer (Subcutaneous Tissue) Exposed: No Tendon Exposed: No Muscle Exposed: No Joint Exposed: No Bone Exposed: No Periwound Skin Texture Texture Color [name], Terri J. ([id number]) No Abnormalities Noted: No No Abnormalities Noted: No Callus: No Atrophie Blanche: No Crepitus: No Cyanosis: No Excoriation: No Ecchymosis: No Induration: No Erythema: No Rash: No Hemosiderin Staining: No Scarring: No Mottled: No Pallor: No Moisture Rubor: No No Abnormalities Noted: No Dry / Scaly: No Temperature / Pain Maceration: No Temperature: No Abnormality Wound Preparation Ulcer Cleansing: Rinsed/Irrigated with Saline Topical Anesthetic Applied: Other: lidocaine 4%, Electronic Signature(s) Signed: 05/07/2018 2:41:32 PM By: Roger Shelter Entered By: Roger Shelter on 05/07/2018 10:45:43 [name], [name] ([id number]) -------------------------------------------------------------------------------- Vitals  Details Patient Name: [name] Date of Service: 05/07/2018 10:15 AM Medical Record Number: [id number] Patient Account Number: [id number] Date of Birth/Sex: September 24, 1935 (82 y.o. F) Treating RN: Montey Hora Primary Care Jullian Previti: Fenton Malling Other Clinician: Referring Jashaun Penrose: Fenton Malling Treating Birney Belshe/Extender: Cathie Olden in Treatment: 2 Vital Signs Time Taken: 10:20 Temperature (F): 98.2 Height (in): 60 Pulse (bpm): 60 Weight (lbs): 151  [name], [name] ([id number]) Visit Report for 05/07/2018 Arrival Information Details Patient Name: [name], [name]. Date of Service: 05/07/2018 10:15 AM Medical Record Number: [id number] Patient Account Number: [id number] Date of Birth/Sex: 02/09/1936 (82 y.o. F) Treating RN: Montey Hora Primary Care Caidyn Blossom: Fenton Malling Other Clinician: Referring Merdith Boyd: Fenton Malling Treating Dovber Ernest/Extender: Cathie Olden in Treatment: 2 Visit Information History Since Last Visit Added or deleted any medications: No Patient Arrived: Cane Any new allergies or adverse reactions: No Arrival Time: 10:18 Had a fall or experienced change in No Accompanied By: husband activities of daily living that may affect Transfer Assistance: None risk of falls: Patient Identification Verified: Yes Signs or symptoms of abuse/neglect since last visito No Secondary Verification Process Completed: Yes Hospitalized since last visit: No Implantable device outside of the clinic excluding No cellular tissue based products placed in the center since last visit: Has Dressing in Place as Prescribed: Yes Has Compression in Place as Prescribed: Yes Pain Present Now: No Electronic Signature(s) Signed: 05/07/2018 5:08:53 PM By: Montey Hora Entered By: Montey Hora on 05/07/2018 10:19:05 [name] ([id number]) -------------------------------------------------------------------------------- Encounter Discharge Information Details Patient Name: [name], [name]. Date of Service: 05/07/2018 10:15 AM Medical Record Number: [id number] Patient Account Number: [id number] Date of Birth/Sex: 04/07/1936 (82 y.o. F) Treating RN: Roger Shelter Primary Care Kian Ottaviano: Fenton Malling Other Clinician: Referring Renell Allum: Fenton Malling Treating Saryah Loper/Extender: Cathie Olden in Treatment: 2 Encounter Discharge Information Items Discharge Condition: Stable Ambulatory Status:  Ambulatory Discharge Destination: Home Transportation: Private Auto Accompanied By: husband Schedule Follow-up Appointment: Yes Clinical Summary of Care: Electronic Signature(s) Signed: 05/07/2018 2:41:32 PM By: Roger Shelter Entered By: Roger Shelter on 05/07/2018 11:21:07 [name] ([id number]) -------------------------------------------------------------------------------- Lower Extremity Assessment Details Patient Name: [name], [name]. Date of Service: 05/07/2018 10:15 AM Medical Record Number: [id number] Patient Account Number: [id number] Date of Birth/Sex: 06/13/1936 (82 y.o. F) Treating RN: Montey Hora Primary Care Tramaine Sauls: Fenton Malling Other Clinician: Referring Aubre Quincy: Fenton Malling Treating Harsimran Westman/Extender: Cathie Olden in Treatment: 2 Edema Assessment Assessed: [Left: No] [Right: No] [Left: Edema] [Right: :] Calf Left: Right: Point of Measurement: 30 cm From Medial Instep 33.2 cm 30.3 cm Ankle Left: Right: Point of Measurement: 10 cm From Medial Instep 22.3 cm 19.7 cm Vascular Assessment Pulses: Dorsalis Pedis Palpable: [Left:Yes] [Right:Yes] Posterior Tibial Extremity colors, hair growth, and conditions: Extremity Color: [Left:Mottled] [Right:Mottled] Temperature of Extremity: [Left:Hot] [Right:Warm] Capillary Refill: [Left:< 3 seconds] [Right:< 3 seconds] Toe Nail Assessment Left: Right: Thick: Yes Yes Discolored: No No Deformed: No No Improper Length and Hygiene: No No Electronic Signature(s) Signed: 05/07/2018 5:08:53 PM By: Montey Hora Entered By: Montey Hora on 05/07/2018 10:28:21 [name], [name] ([id number]) -------------------------------------------------------------------------------- Multi Wound Chart Details Patient Name: [name]. Date of Service: 05/07/2018 10:15 AM Medical Record Number: [id number] Patient Account Number: [id number] Date of Birth/Sex: 1936/01/08 (82 y.o. F) Treating  RN: Roger Shelter Primary Care Tareek Sabo: Fenton Malling Other Clinician: Referring Cela Newcom: Fenton Malling Treating Naomi Castrogiovanni/Extender: Cathie Olden in Treatment: 2 Vital Signs Height(in): 60 Pulse(bpm): 60 Weight(lbs): 151 Blood Pressure(mmHg): 144/55 Body Mass Index(BMI): 29 Temperature(F): 98.2 Respiratory Rate 16 (breaths/min): Photos: [2:No Photos] [3:No Photos] [4:No Photos] Wound Location: [2:Right Knee] [3:Left Lower Leg - Lateral] [4:Left Lower Leg - Medial] Wounding Event: [2:Trauma] [3:Trauma] [4:Trauma] Primary Etiology: [2:Skin Tear] [3:Skin Tear] [4:Skin Tear] Comorbid History: [2:Hypertension, Osteoarthritis] [3:Hypertension, Osteoarthritis] [4:Hypertension, Osteoarthritis] Date Acquired: [2:04/20/2018] [3:04/20/2018] [4:04/20/2018] Weeks of Treatment: [2:2] [3:2] [4:2] Wound Status: [2:Healed - Epithelialized] [3:Open] [4:Open] Measurements L x W x  [name], [name] ([id number]) Visit Report for 05/07/2018 Arrival Information Details Patient Name: [name], [name]. Date of Service: 05/07/2018 10:15 AM Medical Record Number: [id number] Patient Account Number: [id number] Date of Birth/Sex: 02/09/1936 (82 y.o. F) Treating RN: Montey Hora Primary Care Caidyn Blossom: Fenton Malling Other Clinician: Referring Merdith Boyd: Fenton Malling Treating Dovber Ernest/Extender: Cathie Olden in Treatment: 2 Visit Information History Since Last Visit Added or deleted any medications: No Patient Arrived: Cane Any new allergies or adverse reactions: No Arrival Time: 10:18 Had a fall or experienced change in No Accompanied By: husband activities of daily living that may affect Transfer Assistance: None risk of falls: Patient Identification Verified: Yes Signs or symptoms of abuse/neglect since last visito No Secondary Verification Process Completed: Yes Hospitalized since last visit: No Implantable device outside of the clinic excluding No cellular tissue based products placed in the center since last visit: Has Dressing in Place as Prescribed: Yes Has Compression in Place as Prescribed: Yes Pain Present Now: No Electronic Signature(s) Signed: 05/07/2018 5:08:53 PM By: Montey Hora Entered By: Montey Hora on 05/07/2018 10:19:05 [name] ([id number]) -------------------------------------------------------------------------------- Encounter Discharge Information Details Patient Name: [name], [name]. Date of Service: 05/07/2018 10:15 AM Medical Record Number: [id number] Patient Account Number: [id number] Date of Birth/Sex: 04/07/1936 (82 y.o. F) Treating RN: Roger Shelter Primary Care Kian Ottaviano: Fenton Malling Other Clinician: Referring Renell Allum: Fenton Malling Treating Saryah Loper/Extender: Cathie Olden in Treatment: 2 Encounter Discharge Information Items Discharge Condition: Stable Ambulatory Status:  Ambulatory Discharge Destination: Home Transportation: Private Auto Accompanied By: husband Schedule Follow-up Appointment: Yes Clinical Summary of Care: Electronic Signature(s) Signed: 05/07/2018 2:41:32 PM By: Roger Shelter Entered By: Roger Shelter on 05/07/2018 11:21:07 [name] ([id number]) -------------------------------------------------------------------------------- Lower Extremity Assessment Details Patient Name: [name], [name]. Date of Service: 05/07/2018 10:15 AM Medical Record Number: [id number] Patient Account Number: [id number] Date of Birth/Sex: 06/13/1936 (82 y.o. F) Treating RN: Montey Hora Primary Care Tramaine Sauls: Fenton Malling Other Clinician: Referring Aubre Quincy: Fenton Malling Treating Harsimran Westman/Extender: Cathie Olden in Treatment: 2 Edema Assessment Assessed: [Left: No] [Right: No] [Left: Edema] [Right: :] Calf Left: Right: Point of Measurement: 30 cm From Medial Instep 33.2 cm 30.3 cm Ankle Left: Right: Point of Measurement: 10 cm From Medial Instep 22.3 cm 19.7 cm Vascular Assessment Pulses: Dorsalis Pedis Palpable: [Left:Yes] [Right:Yes] Posterior Tibial Extremity colors, hair growth, and conditions: Extremity Color: [Left:Mottled] [Right:Mottled] Temperature of Extremity: [Left:Hot] [Right:Warm] Capillary Refill: [Left:< 3 seconds] [Right:< 3 seconds] Toe Nail Assessment Left: Right: Thick: Yes Yes Discolored: No No Deformed: No No Improper Length and Hygiene: No No Electronic Signature(s) Signed: 05/07/2018 5:08:53 PM By: Montey Hora Entered By: Montey Hora on 05/07/2018 10:28:21 [name], [name] ([id number]) -------------------------------------------------------------------------------- Multi Wound Chart Details Patient Name: [name]. Date of Service: 05/07/2018 10:15 AM Medical Record Number: [id number] Patient Account Number: [id number] Date of Birth/Sex: 1936/01/08 (82 y.o. F) Treating  RN: Roger Shelter Primary Care Tareek Sabo: Fenton Malling Other Clinician: Referring Cela Newcom: Fenton Malling Treating Naomi Castrogiovanni/Extender: Cathie Olden in Treatment: 2 Vital Signs Height(in): 60 Pulse(bpm): 60 Weight(lbs): 151 Blood Pressure(mmHg): 144/55 Body Mass Index(BMI): 29 Temperature(F): 98.2 Respiratory Rate 16 (breaths/min): Photos: [2:No Photos] [3:No Photos] [4:No Photos] Wound Location: [2:Right Knee] [3:Left Lower Leg - Lateral] [4:Left Lower Leg - Medial] Wounding Event: [2:Trauma] [3:Trauma] [4:Trauma] Primary Etiology: [2:Skin Tear] [3:Skin Tear] [4:Skin Tear] Comorbid History: [2:Hypertension, Osteoarthritis] [3:Hypertension, Osteoarthritis] [4:Hypertension, Osteoarthritis] Date Acquired: [2:04/20/2018] [3:04/20/2018] [4:04/20/2018] Weeks of Treatment: [2:2] [3:2] [4:2] Wound Status: [2:Healed - Epithelialized] [3:Open] [4:Open] Measurements L x W x  No Joint: No Joint: No Bone: No Bone: No Epithelialization: Small (1-33%) None N/A Debridement: N/A N/A N/A Pain Control: N/A N/A N/A Tissue Debrided: N/A N/A N/A Level: N/A N/A N/A Debridement Area (sq cm): N/A N/A N/A Instrument: N/A N/A N/A Bleeding: N/A N/A N/A Hemostasis Achieved: N/A N/A N/A Procedural Pain: N/A N/A N/A Post Procedural Pain: N/A N/A N/A Debridement Treatment N/A N/A N/A Response: Post Debridement N/A N/A N/A Measurements L  x W x D (cm) Post Debridement Volume: N/A N/A N/A (cm) Periwound Skin Texture: Excoriation: No Excoriation: No N/A Induration: No Induration: No Callus: No Callus: No Crepitus: No Crepitus: No Rash: No Rash: No Scarring: No Scarring: No Periwound Skin Moisture: Maceration: No Maceration: No N/A Dry/Scaly: No Dry/Scaly: No Periwound Skin Color: Atrophie Blanche: No Atrophie Blanche: No N/A Cyanosis: No Cyanosis: No Ecchymosis: No Ecchymosis: No Erythema: No Erythema: No Hemosiderin Staining: No Hemosiderin Staining: No Mottled: No Mottled: No Pallor: No Pallor: No Rubor: No Rubor: No Erythema Location: N/A N/A N/A Temperature: No Abnormality No Abnormality N/A Tenderness on Palpation: Yes No N/A Wound Preparation: Ulcer Cleansing: Ulcer Cleansing: N/A Rinsed/Irrigated with Saline Rinsed/Irrigated with Saline Topical Anesthetic Applied: Topical Anesthetic Applied: Other: lidocaine 4% Other: lidocaine 4% Procedures Performed: N/A N/A N/A BERNEICE, ZETTLEMOYER ([id number]) Treatment Notes Electronic Signature(s) Signed: 05/07/2018 11:16:09 AM By: Lawanda Cousins Entered By: Lawanda Cousins on 05/07/2018 11:16:09 JOSANNE, BOEREMA ([id number]) -------------------------------------------------------------------------------- [place] Details Patient Name: [name], [name]. Date of Service: 05/07/2018 10:15 AM Medical Record Number: [id number] Patient Account Number: [id number] Date of Birth/Sex: 08/21/1936 (82 y.o. F) Treating RN: Roger Shelter Primary Care Stephaniemarie Stoffel: Fenton Malling Other Clinician: Referring Aroush Chasse: Fenton Malling Treating Garrie Woodin/Extender: Cathie Olden in Treatment: 2 Active Inactive ` Abuse / Safety / Falls / Self Care Management Nursing Diagnoses: Impaired physical mobility Goals: Patient will remain injury free related to falls Date Initiated: 04/23/2018 Target Resolution Date: 07/03/2018 Goal  Status: Active Interventions: Assess fall risk on admission and as needed Notes: ` Orientation to the Wound Care Program Nursing Diagnoses: Knowledge deficit related to the wound healing center program Goals: Patient/caregiver will verbalize understanding of the [facility] Date Initiated: 04/23/2018 Target Resolution Date: 07/03/2018 Goal Status: Active Interventions: Provide education on orientation to the wound center Notes: ` Pain, Acute or Chronic Nursing Diagnoses: Pain, acute or chronic: actual or potential Goals: Patient/caregiver will verbalize adequate pain control between visits Date Initiated: 04/23/2018 Target Resolution Date: 07/03/2018 Goal Status: Active Interventions: SHARI, NATT ([id number]) Assess comfort goal upon admission Notes: ` Wound/Skin Impairment Nursing Diagnoses: Impaired tissue integrity Goals: Ulcer/skin breakdown will heal within 14 weeks Date Initiated: 04/23/2018 Target Resolution Date: 07/03/2018 Goal Status: Active Interventions: Assess patient/caregiver ability to obtain necessary supplies Assess patient/caregiver ability to perform ulcer/skin care regimen upon admission and as needed Assess ulceration(s) every visit Notes: Electronic Signature(s) Signed: 05/07/2018 2:41:32 PM By: Roger Shelter Entered By: Roger Shelter on 05/07/2018 10:42:09 DARYL, QUIROS ([id number]) -------------------------------------------------------------------------------- Pain Assessment Details Patient Name: [name], PLANCK. Date of Service: 05/07/2018 10:15 AM Medical Record Number: [id number] Patient Account Number: [id number] Date of Birth/Sex: 12-15-1935 (82 y.o. F) Treating RN: Montey Hora Primary Care Sherard Sutch: Fenton Malling Other Clinician: Referring Khali Perella: Fenton Malling Treating Dorri Ozturk/Extender: Cathie Olden in Treatment: 2 Active Problems Location of Pain Severity and Description of  Pain Patient Has Paino Yes Site Locations Rate the pain. Current Pain Level: 6 Worst Pain Level: 8 Least Pain Level: 5 Pain Management and Medication Current Pain Management:  Status: Healed - Epithelialized Wounding Event: Gradually Appeared Comorbid History: Hypertension, Osteoarthritis Date Acquired: 04/27/2018 Weeks Of Treatment: 1 Clustered Wound:  No Photos Photo Uploaded By: Montey Hora on 05/07/2018 13:00:27 Wound Measurements Length: (cm) 0 Width: (cm) 0 Depth: (cm) 0 Area: (cm) 0 Volume: (cm) 0 % Reduction in Area: 100% % Reduction in Volume: 100% Epithelialization: Small (1-33%) Tunneling: No Undermining: No Wound Description Classification: Partial Thickness Foul O Wound Margin: Flat and Intact Slough Exudate Amount: Large Exudate Type: Serous Exudate Color: amber dor After Cleansing: No /Fibrino Yes Wound Bed Granulation Amount: None Present (0%) Exposed Structure Necrotic Amount: Large (67-100%) Fascia Exposed: No Necrotic Quality: Adherent Slough Fat Layer (Subcutaneous Tissue) Exposed: No Tendon Exposed: No Muscle Exposed: No Joint Exposed: No Bone Exposed: No Periwound Skin Texture [name], Terri J. ([id number]) Texture Color No Abnormalities Noted: No No Abnormalities Noted: No Callus: No Atrophie Blanche: No Crepitus: No Cyanosis: No Excoriation: No Ecchymosis: No Induration: No Erythema: No Rash: No Hemosiderin Staining: No Scarring: No Mottled: No Pallor: No Moisture Rubor: No No Abnormalities Noted: No Dry / Scaly: No Temperature / Pain Maceration: No Temperature: No Abnormality Tenderness on Palpation: Yes Wound Preparation Ulcer Cleansing: Rinsed/Irrigated with Saline Topical Anesthetic Applied: Other: lidocaine 4%, Electronic Signature(s) Signed: 05/07/2018 2:41:32 PM By: Roger Shelter Entered By: Roger Shelter on 05/07/2018 10:46:08 [name], [name] ([id number]) -------------------------------------------------------------------------------- Wound Assessment Details Patient Name: [name], [name]. Date of Service: 05/07/2018 10:15 AM Medical Record Number: [id number] Patient Account Number: [id number] Date of Birth/Sex: 11-22-1935 (82 y.o. F) Treating RN: Roger Shelter Primary Care Anaiz Qazi: Fenton Malling Other Clinician: Referring Alonnah Lampkins:  Fenton Malling Treating Mindy Gali/Extender: Lawanda Cousins Weeks in Treatment: 2 Wound Status Wound Number: 6 Primary Etiology: Trauma, Other Wound Location: Left Knee Wound Status: Healed - Epithelialized Wounding Event: Trauma Comorbid History: Hypertension, Osteoarthritis Date Acquired: 04/20/2018 Weeks Of Treatment: 1 Clustered Wound: No Photos Photo Uploaded By: Montey Hora on 05/07/2018 13:00:47 Wound Measurements Length: (cm) 0 % Redu Width: (cm) 0 % Redu Depth: (cm) 0 Epithe Area: (cm) 0 Tunne Volume: (cm) 0 Under ction in Area: 100% ction in Volume: 100% lialization: None ling: No mining: No Wound Description Full Thickness Without Exposed Support Foul O Classification: Structures Slough Wound Margin: Flat and Intact Exudate None Present Amount: dor After Cleansing: No /Fibrino No Wound Bed Granulation Amount: None Present (0%) Exposed Structure Necrotic Amount: Large (67-100%) Fascia Exposed: No Necrotic Quality: Eschar Fat Layer (Subcutaneous Tissue) Exposed: No Tendon Exposed: No Muscle Exposed: No Joint Exposed: No Bone Exposed: No Periwound Skin Texture Texture Color [name], Terri J. ([id number]) No Abnormalities Noted: No No Abnormalities Noted: No Callus: No Atrophie Blanche: No Crepitus: No Cyanosis: No Excoriation: No Ecchymosis: No Induration: No Erythema: No Rash: No Hemosiderin Staining: No Scarring: No Mottled: No Pallor: No Moisture Rubor: No No Abnormalities Noted: No Dry / Scaly: No Temperature / Pain Maceration: No Temperature: No Abnormality Wound Preparation Ulcer Cleansing: Rinsed/Irrigated with Saline Topical Anesthetic Applied: Other: lidocaine 4%, Electronic Signature(s) Signed: 05/07/2018 2:41:32 PM By: Roger Shelter Entered By: Roger Shelter on 05/07/2018 10:45:43 [name], [name] ([id number]) -------------------------------------------------------------------------------- Vitals  Details Patient Name: [name] Date of Service: 05/07/2018 10:15 AM Medical Record Number: [id number] Patient Account Number: [id number] Date of Birth/Sex: September 24, 1935 (82 y.o. F) Treating RN: Montey Hora Primary Care Jullian Previti: Fenton Malling Other Clinician: Referring Jashaun Penrose: Fenton Malling Treating Birney Belshe/Extender: Cathie Olden in Treatment: 2 Vital Signs Time Taken: 10:20 Temperature (F): 98.2 Height (in): 60 Pulse (bpm): 60 Weight (lbs): 151  Amount: Large (67-100%) Fat Layer (Subcutaneous Tissue) Exposed: Yes Necrotic Quality: Eschar Tendon Exposed: No Muscle Exposed: No Joint Exposed: No Bone Exposed: No Rozelle, Kenneshia J. ([id number]) Periwound Skin Texture Texture Color No Abnormalities Noted: No No Abnormalities Noted: No Callus: No Atrophie Blanche: No Crepitus: No Cyanosis: No Excoriation: No Ecchymosis: No Induration: No Erythema: Yes Rash: No Erythema Location: Circumferential Scarring: No Hemosiderin Staining: No Mottled: No Moisture Pallor: No No Abnormalities Noted: No Rubor: No Dry / Scaly: No Maceration: No Temperature / Pain Temperature: Hot Tenderness on Palpation: Yes Wound Preparation Ulcer Cleansing: Rinsed/Irrigated with Saline Topical Anesthetic Applied: Other: lidocaine 4%, Treatment Notes Wound #3 (Left, Lateral  Lower Leg) 1. Cleansed with: Clean wound with Normal Saline 2. Anesthetic Topical Lidocaine 4% cream to wound bed prior to debridement 4. Dressing Applied: Medihoney Gel 5. Secondary Dressing Applied ABD Pad Kerlix/Conform Notes ace wrap bilateral with brace on right lower leg Electronic Signature(s) Signed: 05/07/2018 5:08:53 PM By: Montey Hora Entered By: Montey Hora on 05/07/2018 10:32:46 Helget, [name] ([id number]) -------------------------------------------------------------------------------- Wound Assessment Details Patient Name: EMBERLY, TOMASSO. Date of Service: 05/07/2018 10:15 AM Medical Record Number: [id number] Patient Account Number: [id number] Date of Birth/Sex: [birth year] (82 y.o. F) Treating RN: Montey Hora Primary Care Treston Coker: Fenton Malling Other Clinician: Referring Demani Weyrauch: Fenton Malling Treating Mikail Goostree/Extender: Lawanda Cousins Weeks in Treatment: 2 Wound Status Wound Number: 4 Primary Etiology: Skin Tear Wound Location: Left Lower Leg - Medial Wound Status: Open Wounding Event: Trauma Comorbid History: Hypertension, Osteoarthritis Date Acquired: 04/20/2018 Weeks Of Treatment: 2 Clustered Wound: No Photos Photo Uploaded By: Montey Hora on 05/07/2018 13:00:26 Wound Measurements Length: (cm) 0.8 Width: (cm) 1 Depth: (cm) 0.1 Area: (cm) 0.628 Volume: (cm) 0.063 % Reduction in Area: 84.8% % Reduction in Volume: 84.7% Epithelialization: None Tunneling: No Undermining: No Wound Description Classification: Partial Thickness Foul O Wound Margin: Flat and Intact Slough Exudate Amount: Medium Exudate Type: Serous Exudate Color: amber dor After Cleansing: No /Fibrino Yes Wound Bed Granulation Amount: Medium (34-66%) Exposed Structure Granulation Quality: Pink Fascia Exposed: No Necrotic Amount: Medium (34-66%) Fat Layer (Subcutaneous Tissue) Exposed: Yes Necrotic Quality: Adherent Slough Tendon Exposed: No Muscle  Exposed: No Joint Exposed: No Bone Exposed: No Periwound Skin Texture Sleep, Alison J. ([id number]) Texture Color No Abnormalities Noted: No No Abnormalities Noted: No Callus: No Atrophie Blanche: No Crepitus: No Cyanosis: No Excoriation: No Ecchymosis: No Induration: No Erythema: Yes Rash: No Erythema Location: Circumferential Scarring: No Hemosiderin Staining: No Mottled: No Moisture Pallor: No No Abnormalities Noted: No Rubor: No Dry / Scaly: No Maceration: No Temperature / Pain Temperature: Hot Tenderness on Palpation: Yes Wound Preparation Ulcer Cleansing: Rinsed/Irrigated with Saline Topical Anesthetic Applied: Other: lidocaine 4%, Treatment Notes Wound #4 (Left, Medial Lower Leg) 1. Cleansed with: Clean wound with Normal Saline 2. Anesthetic Topical Lidocaine 4% cream to wound bed prior to debridement 4. Dressing Applied: Medihoney Gel 5. Secondary Dressing Applied ABD Pad Kerlix/Conform Notes ace wrap bilateral with brace on right lower leg Electronic Signature(s) Signed: 05/07/2018 5:08:53 PM By: Montey Hora Entered By: Montey Hora on 05/07/2018 10:33:27 VERDELL, KINCANNON ([id number]) -------------------------------------------------------------------------------- Wound Assessment Details Patient Name: NORA, SABEY. Date of Service: 05/07/2018 10:15 AM Medical Record Number: [id number] Patient Account Number: [id number] Date of Birth/Sex: [birth year] (82 y.o. F) Treating RN: Roger Shelter Primary Care Josseline Reddin: Fenton Malling Other Clinician: Referring Dajon Lazar: Fenton Malling Treating Kaelin Holford/Extender: Lawanda Cousins Weeks in Treatment: 2 Wound Status Wound Number: 5 Primary Etiology: Trauma, Other Wound Location: Right, Medial Lower Leg Wound

## 2018-05-11 ENCOUNTER — Ambulatory Visit: Payer: Medicare Other | Admitting: Physician Assistant

## 2018-05-11 DIAGNOSIS — M5416 Radiculopathy, lumbar region: Secondary | ICD-10-CM | POA: Diagnosis not present

## 2018-05-11 NOTE — Progress Notes (Signed)
[name], [name] ([id number]) Visit Report for 05/07/2018 Chief Complaint Document Details Patient Name: [name], [name]. Date of Service: 05/07/2018 10:15 AM Medical Record Number: [id number] Patient Account Number: [id number] Date of Birth/Sex: 1936-04-23 (82 y.o. F) Treating RN: Roger Shelter Primary Care Provider: Fenton Malling Other Clinician: Referring Provider: Fenton Malling Treating Provider/Extender: Cathie Olden in Treatment: 2 Information Obtained from: Patient Chief Complaint LLE Electronic Signature(s) Signed: 05/07/2018 11:16:39 AM By: Lawanda Cousins Entered By: Lawanda Cousins on 05/07/2018 11:16:38 [name], [name] ([id number]) -------------------------------------------------------------------------------- Debridement Details Patient Name: [name] Date of Service: 05/07/2018 10:15 AM Medical Record Number: [id number] Patient Account Number: [id number] Date of Birth/Sex: 1936-04-22 (82 y.o. F) Treating RN: Roger Shelter Primary Care Provider: Fenton Malling Other Clinician: Referring Provider: Fenton Malling Treating Provider/Extender: Cathie Olden in Treatment: 2 Debridement Performed for Wound #3 Left,Lateral Lower Leg Assessment: Performed By: Physician Lawanda Cousins, NP Debridement Type: Debridement Pre-procedure Verification/Time Yes - 10:47 Out Taken: Start Time: 10:47 Pain Control: Other : lidocaine 4% Total Area Debrided (L x W): 11.2 (cm) x 8.5 (cm) = 95.2 (cm) Tissue and other material Viable, Non-Viable, Eschar, Slough, Subcutaneous, Slough debrided: Level: Skin/Subcutaneous Tissue Debridement Description: Excisional Instrument: Curette Bleeding: Minimum Hemostasis Achieved: Pressure End Time: 11:00 Procedural Pain: 0 Post Procedural Pain: 0 Response to Treatment: Procedure was tolerated well Level of Consciousness: Awake and Alert Post Debridement Measurements of Total Wound Length: (cm)  11.2 Width: (cm) 8.5 Depth: (cm) 0.1 Volume: (cm) 7.477 Character of Wound/Ulcer Post Debridement: Stable Post Procedure Diagnosis Same as Pre-procedure Electronic Signature(s) Signed: 05/07/2018 11:16:29 AM By: Lawanda Cousins Signed: 05/07/2018 2:41:32 PM By: Roger Shelter Entered By: Lawanda Cousins on 05/07/2018 11:16:29 [name], [name] ([id number]) -------------------------------------------------------------------------------- HPI Details Patient Name: [name], [name]. Date of Service: 05/07/2018 10:15 AM Medical Record Number: [id number] Patient Account Number: [id number] Date of Birth/Sex: 11/11/1935 (82 y.o. F) Treating RN: Roger Shelter Primary Care Provider: Fenton Malling Other Clinician: Referring Provider: Fenton Malling Treating Provider/Extender: Cathie Olden in Treatment: 2 History of Present Illness HPI Description: 04/23/18-She is seen in initial evaluation for multiple skin tears s/p fall on 8/19. She did sustain a closed fracture of the right patella secondary to that fall. She presented to emergency department on 8/19 for treatment. The left leg laceration was unable to be sutured closed, Steri-Strips were applied and she was treated with knee immobilizer for the patella fracture. She continues on Keflex that was initiated on 8/19. The left leg skin tear flap was cleansed and reapproximated, secure with steri strips. She will follow up next week 04/30/18-She is seen in follow-up evaluation for multiple skin tears. She continues with knee immobilizers to the right lower extremity, has developed lower extremity edema with subsequent weeping; will initiate ace wrap compression. The flap is mostly devitalized, which was somewhat expected; we will change treatment plan and she will follow up next week 05/07/18-She is seen in follow-up evaluation for multiple wounds. The left lateral lower leg skin flap is nonviable/devitalized, now eschar covered. She  tolerated debridement fairly well. The right lower extremity skin tear that was new last week has essentially healed. She has responded nicely to Ace wrap compression. We will modify treatment plan and she will follow-up next week Electronic Signature(s) Signed: 05/07/2018 8:04:36 PM By: Lawanda Cousins Previous Signature: 05/07/2018 11:17:35 AM Version By: Lawanda Cousins Entered By: Lawanda Cousins on 05/07/2018 20:04:36 [name], [name] ([id number]) -------------------------------------------------------------------------------- Physician Orders Details Patient Name: [name], [name] Date of Service: 05/07/2018 10:15 AM Medical  Record Number: [id number] Patient Account Number: [id number] Date of Birth/Sex: 15-Jul-1936 (82 y.o. F) Treating RN: Roger Shelter Primary Care Provider: Fenton Malling Other Clinician: Referring Provider: Fenton Malling Treating Provider/Extender: Cathie Olden in Treatment: 2 Verbal / Phone Orders: No Diagnosis Coding ICD-10 Coding Code Description [phone] Laceration without foreign body, left lower leg, sequela S80.212S Abrasion, left knee, sequela S80.211S Abrasion, right knee, sequela Wound Cleansing Wound #3 Left,Lateral Lower Leg o Clean wound with Normal Saline. Wound #4 Left,Medial Lower Leg o Clean wound with Normal Saline. Anesthetic (add to Medication List) Wound #3 Left,Lateral Lower Leg o Topical Lidocaine 4% cream applied to wound bed prior to debridement (In Clinic Only). Wound #4 Left,Medial Lower Leg o Topical Lidocaine 4% cream applied to wound bed prior to debridement (In Clinic Only). Primary Wound Dressing Wound #3 Left,Lateral Lower Leg o Medihoney gel Wound #4 Left,Medial Lower Leg o Medihoney gel Secondary Dressing Wound #3 Left,Lateral Lower Leg o ABD and Kerlix/Conform Wound #4 Left,Medial Lower Leg o ABD and Kerlix/Conform Dressing Change Frequency Wound #3 Left,Lateral Lower Leg o Change  dressing every other day. Wound #4 Left,Medial Lower Leg o Change dressing every other day. Follow-up Appointments [name], [name] ([id number]) Wound #3 Left,Lateral Lower Leg o Return Appointment in 1 week. Wound #4 Left,Medial Lower Leg o Return Appointment in 1 week. Edema Control Wound #3 Left,Lateral Lower Leg o Elevate legs to the level of the heart and pump ankles as often as possible o Other: - ACE wrap Wound #4 Left,Medial Lower Leg o Elevate legs to the level of the heart and pump ankles as often as possible o Other: - ACE wrap Additional Orders / Instructions Wound #3 Left,Lateral Lower Leg o Vitamin A; Vitamin C, Zinc - Please add a multivitamin that has 100% of vitamin A, vitamin C and zinc supplements in it o Increase protein intake. o Activity as tolerated Wound #4 Left,Medial Lower Leg o Vitamin A; Vitamin C, Zinc - Please add a multivitamin that has 100% of vitamin A, vitamin C and zinc supplements in it o Increase protein intake. o Activity as tolerated Home Health Wound #3 Left,Lateral Lower Leg o [place] for [place] Visits o Home Health Nurse may visit PRN to address patientos wound care needs. o FACE TO FACE ENCOUNTER: MEDICARE and MEDICAID PATIENTS: I certify that this patient is under my care and that I had a face-to-face encounter that meets the physician face-to-face encounter requirements with this patient on this date. The encounter with the patient was in whole or in part for the following MEDICAL CONDITION: (primary reason for [place]) MEDICAL NECESSITY: I certify, that based on my findings, NURSING services are a medically necessary home health service. HOME BOUND STATUS: I certify that my clinical findings support that this patient is homebound (i.e., Due to illness or injury, pt requires aid of supportive devices such as crutches, cane, wheelchairs, walkers,  the use of special transportation or the assistance of another person to leave their place of residence. There is a normal inability to leave the home and doing so requires considerable and taxing effort. Other absences are for medical reasons / religious services and are infrequent or of short duration when for other reasons). o If current dressing causes regression in wound condition, may D/C ordered dressing product/s and apply Normal Saline Moist Dressing daily until next [place] / Other MD appointment. [place] of regression in wound condition at [phone]. o  Please direct any NON-WOUND related issues/requests for orders to patient's Primary Care Physician Wound #4 [place] for [place] Nurse may visit PRN to address patientos wound care needs. o FACE TO FACE ENCOUNTER: MEDICARE and MEDICAID PATIENTS: I certify that this patient is under my care and that I had a face-to-face encounter that meets the physician face-to-face encounter requirements with this patient on this date. The encounter with the patient was in whole or in part for the following MEDICAL CONDITION: (primary reason for [place]) MEDICAL NECESSITY: I certify, that based on my findings, [name], [name] ([id number]) NURSING services are a medically necessary home health service. HOME BOUND STATUS: I certify that my clinical findings support that this patient is homebound (i.e., Due to illness or injury, pt requires aid of supportive devices such as crutches, cane, wheelchairs, walkers, the use of special transportation or the assistance of another person to leave their place of residence. There is a normal inability to leave the home and doing so requires considerable and taxing effort. Other absences are for medical reasons / religious services and are infrequent or of short duration  when for other reasons). o If current dressing causes regression in wound condition, may D/C ordered dressing product/s and apply Normal Saline Moist Dressing daily until next [place] / Other MD appointment. [place] of regression in wound condition at [phone]. o Please direct any NON-WOUND related issues/requests for orders to patient's Primary Care Physician Electronic Signature(s) Signed: 05/07/2018 6:16:13 PM By: Lawanda Cousins Entered By: Lawanda Cousins on 05/07/2018 11:19:33 [name], [name] ([id number]) -------------------------------------------------------------------------------- Problem List Details Patient Name: MYRANDA, PAVONE. Date of Service: 05/07/2018 10:15 AM Medical Record Number: [id number] Patient Account Number: [id number] Date of Birth/Sex: August 01, 1936 (82 y.o. F) Treating RN: Roger Shelter Primary Care Provider: Fenton Malling Other Clinician: Referring Provider: Fenton Malling Treating Provider/Extender: Cathie Olden in Treatment: 2 Active Problems ICD-10 Evaluated Encounter Code Description Active Date Today Diagnosis S81.812S Laceration without foreign body, left lower leg, sequela 04/23/2018 No Yes S80.212S Abrasion, left knee, sequela 04/23/2018 No Yes S80.211S Abrasion, right knee, sequela 04/23/2018 No Yes Inactive Problems Resolved Problems Electronic Signature(s) Signed: 05/07/2018 11:16:03 AM By: Lawanda Cousins Entered By: Lawanda Cousins on 05/07/2018 11:16:03 [name] ([id number]) -------------------------------------------------------------------------------- Progress Note Details Patient Name: [name]. Date of Service: 05/07/2018 10:15 AM Medical Record Number: [id number] Patient Account Number: [id number] Date of Birth/Sex: 1936/04/08 (82 y.o. F) Treating RN: Roger Shelter Primary Care Provider: Fenton Malling Other Clinician: Referring Provider: Fenton Malling Treating Provider/Extender: Cathie Olden in Treatment: 2 Subjective Chief Complaint Information obtained from Patient LLE History of Present Illness (HPI) 04/23/18-She is seen in initial evaluation for multiple skin tears s/p fall on 8/19. She did sustain a closed fracture of the right patella secondary to that fall. She presented to emergency department on 8/19 for treatment. The left leg laceration was unable to be sutured closed, Steri-Strips were applied and she was treated with knee immobilizer for the patella fracture. She continues on Keflex that was initiated on 8/19. The left leg skin tear flap was cleansed and reapproximated, secure with steri strips. She will follow up next week 04/30/18-She is seen in follow-up evaluation for multiple skin tears. She continues with knee immobilizers to the right lower extremity, has developed lower extremity edema with subsequent weeping; will initiate ace wrap compression. The flap is mostly devitalized, which was  somewhat expected; we will change treatment plan and she will follow up next week 05/07/18-She is seen in follow-up evaluation for multiple wounds. The left lateral lower leg skin flap is nonviable/devitalized, now eschar covered. She tolerated debridement fairly well. The right lower extremity skin tear that was new last week has essentially healed. She has responded nicely to Ace wrap compression. We will modify treatment plan and she will follow-up next week Patient History Information obtained from Patient. Family History Cancer - Mother, Lung Disease - Siblings, No family history of Diabetes, Heart Disease, Hereditary Spherocytosis, Hypertension, Kidney Disease, Seizures, Stroke, Thyroid Problems, Tuberculosis. Social History Never smoker, Marital Status - Married, Alcohol Use - Never, Drug Use - No History, Caffeine Use - Daily. Medical And Surgical History Notes Respiratory pulmonary fibrosis, home O2 at night  and PRN Gastrointestinal diverticulosis [name], [name]. ([id number]) Objective Constitutional Vitals Time Taken: 10:20 AM, Height: 60 in, Weight: 151 lbs, BMI: 29.5, Temperature: 98.2 F, Pulse: 60 bpm, Respiratory Rate: 16 breaths/min, Blood Pressure: 144/55 mmHg. Integumentary (Hair, Skin) Wound #2 status is Healed - Epithelialized. Original cause of wound was Trauma. The wound is located on the Right Knee. The wound measures 0cm length x 0cm width x 0cm depth; 0cm^2 area and 0cm^3 volume. There is no tunneling or undermining noted. There is a none present amount of drainage noted. The wound margin is flat and intact. There is no granulation within the wound bed. There is a large (67-100%) amount of necrotic tissue within the wound bed including Eschar. The periwound skin appearance did not exhibit: Callus, Crepitus, Excoriation, Induration, Rash, Scarring, Dry/Scaly, Maceration, Atrophie Blanche, Cyanosis, Ecchymosis, Hemosiderin Staining, Mottled, Pallor, Rubor, Erythema. Periwound temperature was noted as No Abnormality. The periwound has tenderness on palpation. Wound #3 status is Open. Original cause of wound was Trauma. The wound is located on the Left,Lateral Lower Leg. The wound measures 11.2cm length x 8.5cm width x 0.1cm depth; 74.77cm^2 area and 7.477cm^3 volume. There is Fat Layer (Subcutaneous Tissue) Exposed exposed. There is no tunneling or undermining noted. There is a small amount of serosanguineous drainage noted. The wound margin is flat and intact. There is small (1-33%) red granulation within the wound bed. There is a large (67-100%) amount of necrotic tissue within the wound bed including Eschar. The periwound skin appearance exhibited: Erythema. The periwound skin appearance did not exhibit: Callus, Crepitus, Excoriation, Induration, Rash, Scarring, Dry/Scaly, Maceration, Atrophie Blanche, Cyanosis, Ecchymosis, Hemosiderin Staining, Mottled, Pallor, Rubor. The  surrounding wound skin color is noted with erythema which is circumferential. Periwound temperature was noted as Hot. The periwound has tenderness on palpation. Wound #4 status is Open. Original cause of wound was Trauma. The wound is located on the Left,Medial Lower Leg. The wound measures 0.8cm length x 1cm width x 0.1cm depth; 0.628cm^2 area and 0.063cm^3 volume. There is Fat Layer (Subcutaneous Tissue) Exposed exposed. There is no tunneling or undermining noted. There is a medium amount of serous drainage noted. The wound margin is flat and intact. There is medium (34-66%) pink granulation within the wound bed. There is a medium (34-66%) amount of necrotic tissue within the wound bed including Adherent Slough. The periwound skin appearance exhibited: Erythema. The periwound skin appearance did not exhibit: Callus, Crepitus, Excoriation, Induration, Rash, Scarring, Dry/Scaly, Maceration, Atrophie Blanche, Cyanosis, Ecchymosis, Hemosiderin Staining, Mottled, Pallor, Rubor. The surrounding wound skin color is noted with erythema which is circumferential. Periwound temperature was noted as Hot. The periwound has tenderness on palpation. Wound #5 status is Healed -  Epithelialized. Original cause of wound was Gradually Appeared. The wound is located on the Right,Medial Lower Leg. The wound measures 0cm length x 0cm width x 0cm depth; 0cm^2 area and 0cm^3 volume. There is no tunneling or undermining noted. There is a large amount of serous drainage noted. The wound margin is flat and intact. There is no granulation within the wound bed. There is a large (67-100%) amount of necrotic tissue within the wound bed including Adherent Slough. The periwound skin appearance did not exhibit: Callus, Crepitus, Excoriation, Induration, Rash, Scarring, Dry/Scaly, Maceration, Atrophie Blanche, Cyanosis, Ecchymosis, Hemosiderin Staining, Mottled, Pallor, Rubor, Erythema. Periwound temperature was noted as No  Abnormality. The periwound has tenderness on palpation. Wound #6 status is Healed - Epithelialized. Original cause of wound was Trauma. The wound is located on the Left Knee. The wound measures 0cm length x 0cm width x 0cm depth; 0cm^2 area and 0cm^3 volume. There is no tunneling or undermining noted. There is a none present amount of drainage noted. The wound margin is flat and intact. There is no granulation within the wound bed. There is a large (67-100%) amount of necrotic tissue within the wound bed including Eschar. The periwound skin appearance did not exhibit: Callus, Crepitus, Excoriation, Induration, Rash, Scarring, Dry/Scaly, Maceration, Atrophie Blanche, Cyanosis, Ecchymosis, Hemosiderin Staining, Mottled, Pallor, Rubor, Erythema. Periwound temperature was noted as No Abnormality. BRAILYNN, BRETH ([id number]) Assessment Active Problems ICD-10 Laceration without foreign body, left lower leg, sequela Abrasion, left knee, sequela Abrasion, right knee, sequela Procedures Wound #3 Pre-procedure diagnosis of Wound #3 is a Skin Tear located on the Left,Lateral Lower Leg . There was a Excisional Skin/Subcutaneous Tissue Debridement with a total area of 95.2 sq cm performed by Lawanda Cousins, NP. With the following instrument(s): Curette to remove Viable and Non-Viable tissue/material. Material removed includes Eschar, Subcutaneous Tissue, and Slough after achieving pain control using Other (lidocaine 4%). No specimens were taken. A time out was conducted at 10:47, prior to the start of the procedure. A Minimum amount of bleeding was controlled with Pressure. The procedure was tolerated well with a pain level of 0 throughout and a pain level of 0 following the procedure. Patient s Level of Consciousness post procedure was recorded as Awake and Alert. Post Debridement Measurements: 11.2cm length x 8.5cm width x 0.1cm depth; 7.477cm^3 volume. Character of Wound/Ulcer Post Debridement is  stable. Post procedure Diagnosis Wound #3: Same as Pre-Procedure Plan Wound Cleansing: Wound #3 Left,Lateral Lower Leg: Clean wound with Normal Saline. Wound #4 Left,Medial Lower Leg: Clean wound with Normal Saline. Anesthetic (add to Medication List): Wound #3 Left,Lateral Lower Leg: Topical Lidocaine 4% cream applied to wound bed prior to debridement (In Clinic Only). Wound #4 Left,Medial Lower Leg: Topical Lidocaine 4% cream applied to wound bed prior to debridement (In Clinic Only). Primary Wound Dressing: Wound #3 Left,Lateral Lower Leg: Medihoney gel Wound #4 Left,Medial Lower Leg: Medihoney gel Secondary Dressing: Wound #3 Left,Lateral Lower Leg: ABD and Kerlix/Conform Wound #4 Left,Medial Lower Leg: ABD and Kerlix/Conform Dressing Change Frequency: Wound #3 Left,Lateral Lower Leg: Change dressing every other day. ABRYANNA, MUSOLINO ([id number]) Wound #4 Left,Medial Lower Leg: Change dressing every other day. Follow-up Appointments: Wound #3 Left,Lateral Lower Leg: Return Appointment in 1 week. Wound #4 Left,Medial Lower Leg: Return Appointment in 1 week. Edema Control: Wound #3 Left,Lateral Lower Leg: Elevate legs to the level of the heart and pump ankles as often as possible Other: - ACE wrap Wound #4 Left,Medial Lower Leg: Elevate legs to the level  of the heart and pump ankles as often as possible Other: - ACE wrap Additional Orders / Instructions: Wound #3 Left,Lateral Lower Leg: Vitamin A; Vitamin C, Zinc - Please add a multivitamin that has 100% of vitamin A, vitamin C and zinc supplements in it Increase protein intake. Activity as tolerated Wound #4 Left,Medial Lower Leg: Vitamin A; Vitamin C, Zinc - Please add a multivitamin that has 100% of vitamin A, vitamin C and zinc supplements in it Increase protein intake. Activity as tolerated Home Health: Wound #3 Left,Lateral Lower Leg: [place] for [place] Nurse may visit PRN to address patient s wound care needs. FACE TO FACE ENCOUNTER: MEDICARE and MEDICAID PATIENTS: I certify that this patient is under my care and that I had a face-to-face encounter that meets the physician face-to-face encounter requirements with this patient on this date. The encounter with the patient was in whole or in part for the following MEDICAL CONDITION: (primary reason for [place]) MEDICAL NECESSITY: I certify, that based on my findings, NURSING services are a medically necessary home health service. HOME BOUND STATUS: I certify that my clinical findings support that this patient is homebound (i.e., Due to illness or injury, pt requires aid of supportive devices such as crutches, cane, wheelchairs, walkers, the use of special transportation or the assistance of another person to leave their place of residence. There is a normal inability to leave the home and doing so requires considerable and taxing effort. Other absences are for medical reasons / religious services and are infrequent or of short duration when for other reasons). If current dressing causes regression in wound condition, may D/C ordered dressing product/s and apply Normal Saline Moist Dressing daily until next [place] / Other MD appointment. [place] of regression in wound condition at [phone]. Please direct any NON-WOUND related issues/requests for orders to patient's Primary Care Physician Wound #4 Left,Medial Lower Leg: [place] for [place] Nurse may visit PRN to address patient s wound care needs. FACE TO FACE ENCOUNTER: MEDICARE and MEDICAID PATIENTS: I certify that this patient is under my care and that I had a face-to-face encounter that meets the physician face-to-face encounter requirements with this patient on this date. The encounter with the patient was in whole or  in part for the following MEDICAL CONDITION: (primary reason for [place]) MEDICAL NECESSITY: I certify, that based on my findings, NURSING services are a medically necessary home health service. HOME BOUND STATUS: I certify that my clinical findings support that this patient is homebound (i.e., Due to illness or injury, pt requires aid of supportive devices such as crutches, cane, wheelchairs, walkers, the use of special transportation or the assistance of another person to leave their place of residence. There is a normal inability to leave the home and doing so requires considerable and taxing effort. Other absences are for medical reasons / religious services and are infrequent or of short duration when for other reasons). If current dressing causes regression in wound condition, may D/C ordered dressing product/s and apply Normal Saline Moist Dressing daily until next [place] / Other MD appointment. [place] of regression in wound condition at [phone]. Please direct any NON-WOUND related issues/requests for orders to patient's Primary Care Physician OLIVIANNA, HIGLEY ([id number]) Electronic Signature(s) Signed: 05/07/2018 8:04:56 PM By: Lawanda Cousins Previous Signature: 05/07/2018 11:19:41 AM Version By: Lawanda Cousins  Entered By: Lawanda Cousins on 05/07/2018 20:04:56 [name] ([id number]) -------------------------------------------------------------------------------- ROS/PFSH Details Patient Name: MARYJAYNE, KLEVEN Date of Service: 05/07/2018 10:15 AM Medical Record Number: [id number] Patient Account Number: [id number] Date of Birth/Sex: [birth year] (82 y.o. F) Treating RN: Roger Shelter Primary Care Provider: Fenton Malling Other Clinician: Referring Provider: Fenton Malling Treating Provider/Extender: Cathie Olden in Treatment: 2 Information Obtained From Patient Wound History Do you currently have one or more  open woundso Yes How many open wounds do you currently haveo 2 Approximately how long have you had your woundso 4 days How have you been treating your wound(s) until nowo bandage from the ER Has your wound(s) ever healed and then re-openedo No Have you had any lab work done in the past montho Yes Who ordered the lab work [facility] Have you tested positive for an antibiotic resistant organism (MRSA, VRE)o No Have you tested positive for osteomyelitis (bone infection)o No Have you had any tests for circulation on your legso No Have you had other problems associated with your woundso Swelling Eyes Medical History: Negative for: Cataracts; Glaucoma; Optic Neuritis Ear/Nose/Mouth/Throat Medical History: Negative for: Chronic sinus problems/congestion; Middle ear problems Hematologic/Lymphatic Medical History: Negative for: Anemia; Hemophilia; Human Immunodeficiency Virus; Lymphedema; Sickle Cell Disease Respiratory Medical History: Negative for: Aspiration; Asthma; Chronic Obstructive Pulmonary Disease (COPD); Pneumothorax; Sleep Apnea; Tuberculosis Past Medical History Notes: pulmonary fibrosis, home O2 at night and PRN Cardiovascular Medical History: Positive for: Hypertension Negative for: Angina; Arrhythmia; Congestive Heart Failure; Coronary Artery Disease; Deep Vein Thrombosis; Hypotension; Myocardial Infarction; Peripheral Arterial Disease; Peripheral Venous Disease; Phlebitis; Vasculitis Gastrointestinal Medical History: Negative for: Cirrhosis ; Colitis; Crohnos; Hepatitis A; Hepatitis B; Hepatitis C SHARIKA, MOSQUERA ([id number]) Past Medical History Notes: diverticulosis Endocrine Medical History: Negative for: Type I Diabetes; Type II Diabetes Genitourinary Medical History: Negative for: End Stage Renal Disease Immunological Medical History: Negative for: Lupus Erythematosus; Raynaudos; Scleroderma Integumentary (Skin) Medical History: Negative for: History  of Burn; History of pressure wounds Musculoskeletal Medical History: Positive for: Osteoarthritis Negative for: Gout; Rheumatoid Arthritis; Osteomyelitis Neurologic Medical History: Negative for: Dementia; Neuropathy; Quadriplegia; Paraplegia; Seizure Disorder Oncologic Medical History: Negative for: Received Chemotherapy; Received Radiation Psychiatric Medical History: Negative for: Anorexia/bulimia; Confinement Anxiety Immunizations Pneumococcal Vaccine: Received Pneumococcal Vaccination: Yes Implantable Devices Family and Social History Cancer: Yes - Mother; Diabetes: No; Heart Disease: No; Hereditary Spherocytosis: No; Hypertension: No; Kidney Disease: No; Lung Disease: Yes - Siblings; Seizures: No; Stroke: No; Thyroid Problems: No; Tuberculosis: No; Never smoker; Marital Status - Married; Alcohol Use: Never; Drug Use: No History; Caffeine Use: Daily; Financial Concerns: No; Food, Clothing or Shelter Needs: No; Support System Lacking: No; Transportation Concerns: No; Advanced Directives: No; Patient does not want information on [employer]) WERONIKA, BIRCH ([id number]) Signed: 05/07/2018 2:41:32 PM By: Roger Shelter Signed: 05/07/2018 6:16:13 PM By: Lawanda Cousins Entered By: Lawanda Cousins on 05/07/2018 11:18:08 DOLLY, HARBACH ([id number]) -------------------------------------------------------------------------------- SuperBill Details Patient Name: TAYANA, SHANKLE. Date of Service: 05/07/2018 Medical Record Number: [id number] Patient Account Number: [id number] Date of Birth/Sex: Oct 06, 1935 (82 y.o. F) Treating RN: Roger Shelter Primary Care Provider: Fenton Malling Other Clinician: Referring Provider: Fenton Malling Treating Provider/Extender: Cathie Olden in Treatment: 2 Diagnosis Coding ICD-10 Codes Code Description [phone] Laceration without foreign body, left lower leg, sequela S80.212S Abrasion, left knee,  sequela S80.211S Abrasion, right knee, sequela Facility Procedures CPT4 Code: [id number] Description: 88502 - DEB SUBQ TISSUE 20 SQ CM/< ICD-10 Diagnosis Description S81.812S Laceration without foreign body, left lower  leg, sequela S80.212S Abrasion, left knee, sequela S80.211S Abrasion, right knee, sequela Modifier: Quantity: 1 CPT4 Code: [id number] Description: 24469 - DEB SUBQ TISS EA ADDL 20CM ICD-10 Diagnosis Description S81.812S Laceration without foreign body, left lower leg, sequela S80.212S Abrasion, left knee, sequela S80.211S Abrasion, right knee, sequela Modifier: Quantity: 4 Physician Procedures CPT4 Code: [id number] Description: 50518 - WC PHYS SUBQ TISS 20 SQ CM ICD-10 Diagnosis Description Z35.825P Laceration without foreign body, left lower leg, sequela S80.212S Abrasion, left knee, sequela S80.211S Abrasion, right knee, sequela Modifier: Quantity: 1 CPT4 Code: [id number] Description: 31281 - WC PHYS SUBQ TISS EA ADDL 20 CM ICD-10 Diagnosis Description V88.677J Laceration without foreign body, left lower leg, sequela S80.212S Abrasion, left knee, sequela S80.211S Abrasion, right knee, sequela Modifier: Quantity: 4 Electronic Signature(s) Signed: 05/07/2018 11:20:16 AM By: Lawanda Cousins Entered By: Lawanda Cousins on 05/07/2018 11:20:16

## 2018-05-11 NOTE — Progress Notes (Signed)
BLAISE, PALLADINO ([id number]) Visit Report for 04/30/2018 Chief Complaint Document Details Patient Name: [name], [name]. Date of Service: 04/30/2018 10:00 AM Medical Record Number: [id number] Patient Account Number: [id number] Date of Birth/Sex: 1936/06/21 (81 y.o. F) Treating RN: Ahmed Prima Primary Care Provider: Fenton Malling Other Clinician: Referring Provider: Fenton Malling Treating Provider/Extender: Cathie Olden in Treatment: 1 Information Obtained from: Patient Chief Complaint LLE Electronic Signature(s) Signed: 04/30/2018 11:24:28 AM By: Lawanda Cousins Entered By: Lawanda Cousins on 04/30/2018 11:24:27 [name], [name] ([id number]) -------------------------------------------------------------------------------- HPI Details Patient Name: [name], [name] Date of Service: 04/30/2018 10:00 AM Medical Record Number: [id number] Patient Account Number: [id number] Date of Birth/Sex: 11-Dec-1935 (81 y.o. F) Treating RN: Ahmed Prima Primary Care Provider: Fenton Malling Other Clinician: Referring Provider: Fenton Malling Treating Provider/Extender: Cathie Olden in Treatment: 1 History of Present Illness HPI Description: 04/23/18-She is seen in initial evaluation for multiple skin tears s/p fall on 8/19. She did sustain a closed fracture of the right patella secondary to that fall. She presented to emergency department on 8/19 for treatment. The left leg laceration was unable to be sutured closed, Steri-Strips were applied and she was treated with knee immobilizer for the patella fracture. She continues on Keflex that was initiated on 8/19. The left leg skin tear flap was cleansed and reapproximated, secure with steri strips. She will follow up next week 04/30/18-She is seen in follow-up evaluation for multiple skin tears. She continues with knee immobilizers to the right lower extremity, has developed lower extremity edema with subsequent  weeping; will initiate ace wrap compression. The flap is mostly devitalized, which was somewhat expected; we will change treatment plan and she will follow up next week Electronic Signature(s) Signed: 05/07/2018 8:03:22 PM By: Lawanda Cousins Previous Signature: 04/30/2018 11:27:20 AM Version By: Lawanda Cousins Entered By: Lawanda Cousins on 05/07/2018 20:03:21 [name], [name] ([id number]) -------------------------------------------------------------------------------- Physician Orders Details Patient Name: [name], TEP. Date of Service: 04/30/2018 10:00 AM Medical Record Number: [id number] Patient Account Number: [id number] Date of Birth/Sex: 1936-01-20 (81 y.o. F) Treating RN: Ahmed Prima Primary Care Provider: Fenton Malling Other Clinician: Referring Provider: Fenton Malling Treating Provider/Extender: Cathie Olden in Treatment: 1 Verbal / Phone Orders: Yes Clinician: Pinkerton, Debi Read Back and Verified: Yes Diagnosis Coding Wound Cleansing Wound #2 Right Knee o Clean wound with Normal Saline. Wound #3 Left,Lateral Lower Leg o Clean wound with Normal Saline. Wound #4 Left,Medial Lower Leg o Clean wound with Normal Saline. Wound #5 Right,Medial Lower Leg o Clean wound with Normal Saline. Wound #6 Left Knee o Clean wound with Normal Saline. Anesthetic (add to Medication List) Wound #2 Right Knee o Topical Lidocaine 4% cream applied to wound bed prior to debridement (In Clinic Only). Wound #3 Left,Lateral Lower Leg o Topical Lidocaine 4% cream applied to wound bed prior to debridement (In Clinic Only). Wound #4 Left,Medial Lower Leg o Topical Lidocaine 4% cream applied to wound bed prior to debridement (In Clinic Only). Wound #5 Right,Medial Lower Leg o Topical Lidocaine 4% cream applied to wound bed prior to debridement (In Clinic Only). Wound #6 Left Knee o Topical Lidocaine 4% cream applied to wound bed prior to debridement (In  Clinic Only). Skin Barriers/Peri-Wound Care Wound #2 Right Knee o Skin Prep Wound #6 Left Knee o Skin Prep Primary Wound Dressing Wound #2 Right Knee o Boardered Foam Dressing Wound #3 Left,Lateral Lower Leg [name], Terri J. ([id number]) o Iodoflex Wound #4 Left,Medial Lower Leg o Iodoflex Wound #5 Right,Medial Lower Leg o Mepitel  One Contact layer Wound #6 Left Knee o Boardered Foam Dressing Secondary Dressing Wound #3 Left,Lateral Lower Leg o ABD and Kerlix/Conform Wound #4 Left,Medial Lower Leg o ABD and Kerlix/Conform Wound #5 Right,Medial Lower Leg o ABD pad o Conform/Kerlix Dressing Change Frequency Wound #2 Right Knee o Change dressing every other day. Wound #3 Left,Lateral Lower Leg o Change dressing every other day. Wound #4 Left,Medial Lower Leg o Change dressing every other day. Wound #5 Right,Medial Lower Leg o Change dressing every other day. Wound #6 Left Knee o Change dressing every other day. Follow-up Appointments Wound #2 Right Knee o Return Appointment in 1 week. Wound #3 Left,Lateral Lower Leg o Return Appointment in 1 week. Wound #4 Left,Medial Lower Leg o Return Appointment in 1 week. Wound #6 Left Knee o Return Appointment in 1 week. Edema Control Wound #2 Right Knee o Elevate legs to the level of the heart and pump ankles as often as possible o Other: - ACE wrap [name], [name]. ([id number]) Wound #3 Left,Lateral Lower Leg o Elevate legs to the level of the heart and pump ankles as often as possible o Other: - ACE wrap Wound #4 Left,Medial Lower Leg o Elevate legs to the level of the heart and pump ankles as often as possible o Other: - ACE wrap Wound #5 Right,Medial Lower Leg o Elevate legs to the level of the heart and pump ankles as often as possible o Other: - ACE wrap Wound #6 Left Knee o Elevate legs to the level of the heart and pump ankles as often as  possible o Other: - ACE wrap Additional Orders / Instructions Wound #2 Right Knee o Vitamin A; Vitamin C, Zinc - Please add a multivitamin that has 100% of vitamin A, vitamin C and zinc supplements in it o Increase protein intake. o Activity as tolerated Wound #3 Left,Lateral Lower Leg o Vitamin A; Vitamin C, Zinc - Please add a multivitamin that has 100% of vitamin A, vitamin C and zinc supplements in it o Increase protein intake. o Activity as tolerated Wound #4 Left,Medial Lower Leg o Vitamin A; Vitamin C, Zinc - Please add a multivitamin that has 100% of vitamin A, vitamin C and zinc supplements in it o Increase protein intake. o Activity as tolerated Wound #5 Right,Medial Lower Leg o Vitamin A; Vitamin C, Zinc - Please add a multivitamin that has 100% of vitamin A, vitamin C and zinc supplements in it o Increase protein intake. o Activity as tolerated Wound #6 Left Knee o Vitamin A; Vitamin C, Zinc - Please add a multivitamin that has 100% of vitamin A, vitamin C and zinc supplements in it o Increase protein intake. o Activity as tolerated Home Health Wound #2 Right Knee o [place] for [place] Visits o Home Health Nurse may visit PRN to address patientos wound care needs. o FACE TO FACE ENCOUNTER: MEDICARE and MEDICAID PATIENTS: I certify that this patient is under my care and that I had a face-to-face encounter that meets the physician face-to-face encounter requirements with this patient on this date. The encounter with the patient was in whole or in part for the following [place] ([id number]) CONDITION: (primary reason for Home Healthcare) MEDICAL NECESSITY: I certify, that based on my findings, NURSING services are a medically necessary home health service. HOME BOUND STATUS: I certify that my clinical findings support that this patient is homebound (i.e., Due to  illness or injury, pt requires aid  of supportive devices such as crutches, cane, wheelchairs, walkers, the use of special transportation or the assistance of another person to leave their place of residence. There is a normal inability to leave the home and doing so requires considerable and taxing effort. Other absences are for medical reasons / religious services and are infrequent or of short duration when for other reasons). o If current dressing causes regression in wound condition, may D/C ordered dressing product/s and apply Normal Saline Moist Dressing daily until next [place] / Other MD appointment. [place] of regression in wound condition at [phone]. o Please direct any NON-WOUND related issues/requests for orders to patient's Primary Care Physician Wound #3 [place] for [place] Visits o Home Health Nurse may visit PRN to address patientos wound care needs. o FACE TO FACE ENCOUNTER: MEDICARE and MEDICAID PATIENTS: I certify that this patient is under my care and that I had a face-to-face encounter that meets the physician face-to-face encounter requirements with this patient on this date. The encounter with the patient was in whole or in part for the following MEDICAL CONDITION: (primary reason for [place]) MEDICAL NECESSITY: I certify, that based on my findings, NURSING services are a medically necessary home health service. HOME BOUND STATUS: I certify that my clinical findings support that this patient is homebound (i.e., Due to illness or injury, pt requires aid of supportive devices such as crutches, cane, wheelchairs, walkers, the use of special transportation or the assistance of another person to leave their place of residence. There is a normal inability to leave the home and doing so requires considerable and taxing effort. Other absences are for  medical reasons / religious services and are infrequent or of short duration when for other reasons). o If current dressing causes regression in wound condition, may D/C ordered dressing product/s and apply Normal Saline Moist Dressing daily until next [place] / Other MD appointment. [place] of regression in wound condition at [phone]. o Please direct any NON-WOUND related issues/requests for orders to patient's Primary Care Physician Wound #4 [place] for [place] Nurse may visit PRN to address patientos wound care needs. o FACE TO FACE ENCOUNTER: MEDICARE and MEDICAID PATIENTS: I certify that this patient is under my care and that I had a face-to-face encounter that meets the physician face-to-face encounter requirements with this patient on this date. The encounter with the patient was in whole or in part for the following MEDICAL CONDITION: (primary reason for [place]) MEDICAL NECESSITY: I certify, that based on my findings, NURSING services are a medically necessary home health service. HOME BOUND STATUS: I certify that my clinical findings support that this patient is homebound (i.e., Due to illness or injury, pt requires aid of supportive devices such as crutches, cane, wheelchairs, walkers, the use of special transportation or the assistance of another person to leave their place of residence. There is a normal inability to leave the home and doing so requires considerable and taxing effort. Other absences are for medical reasons / religious services and are infrequent or of short duration when for other reasons). o If current dressing causes regression in wound condition, may D/C ordered dressing product/s and apply Normal Saline Moist Dressing daily until next [place] / Other MD appointment. [place] of  regression in  wound condition at [phone]. o Please direct any NON-WOUND related issues/requests for orders to patient's Primary Care Physician Wound #5 [place] for [place] Nurse may visit PRN to address patientos wound care needs. o FACE TO FACE ENCOUNTER: MEDICARE and MEDICAID PATIENTS: I certify that this patient is under my care and that I had a face-to-face encounter that meets the physician face-to-face encounter requirements with this patient on this date. The encounter with the patient was in whole or in part for the following MEDICAL CONDITION: (primary reason for [place]) MEDICAL NECESSITY: I certify, that based on my findings, [name], [name] ([id number]) NURSING services are a medically necessary home health service. HOME BOUND STATUS: I certify that my clinical findings support that this patient is homebound (i.e., Due to illness or injury, pt requires aid of supportive devices such as crutches, cane, wheelchairs, walkers, the use of special transportation or the assistance of another person to leave their place of residence. There is a normal inability to leave the home and doing so requires considerable and taxing effort. Other absences are for medical reasons / religious services and are infrequent or of short duration when for other reasons). o If current dressing causes regression in wound condition, may D/C ordered dressing product/s and apply Normal Saline Moist Dressing daily until next [place] / Other MD appointment. [place] of regression in wound condition at [phone]. o Please direct any NON-WOUND related issues/requests for orders to patient's Primary Care Physician Wound #6 Left Knee o [place] for [place] Nurse may visit PRN to address  patientos wound care needs. o FACE TO FACE ENCOUNTER: MEDICARE and MEDICAID PATIENTS: I certify that this patient is under my care and that I had a face-to-face encounter that meets the physician face-to-face encounter requirements with this patient on this date. The encounter with the patient was in whole or in part for the following MEDICAL CONDITION: (primary reason for [place]) MEDICAL NECESSITY: I certify, that based on my findings, NURSING services are a medically necessary home health service. HOME BOUND STATUS: I certify that my clinical findings support that this patient is homebound (i.e., Due to illness or injury, pt requires aid of supportive devices such as crutches, cane, wheelchairs, walkers, the use of special transportation or the assistance of another person to leave their place of residence. There is a normal inability to leave the home and doing so requires considerable and taxing effort. Other absences are for medical reasons / religious services and are infrequent or of short duration when for other reasons). o If current dressing causes regression in wound condition, may D/C ordered dressing product/s and apply Normal Saline Moist Dressing daily until next [place] / Other MD appointment. [place] of regression in wound condition at [phone]. o Please direct any NON-WOUND related issues/requests for orders to patient's Primary Care Physician Patient Medications Allergies: No Known Allergies Notifications Medication Indication Start End lidocaine DOSE 1 - topical 4 % cream - 1 cream topical Electronic Signature(s) Signed: 04/30/2018 12:33:42 PM By: Lawanda Cousins Signed: 05/01/2018 4:06:12 PM By: Alric Quan Entered By: Alric Quan on 04/30/2018 11:01:26 [name], [name] ([id number]) -------------------------------------------------------------------------------- Prescription 04/30/2018 Patient Name: KALYN, HOFSTRA. Provider: Lawanda Cousins NP Date of Birth: 17-Oct-1935 NPI#: [id number] Sex: F DEA#: [id number] Phone #: [phone] License #: Patient Address:  [facility] [place], [place] [address] [address] [place], [place] [address] [phone] Allergies No Known Allergies Medication Medication: Route: Strength: Form: lidocaine 4 % topical cream topical 4% cream Class: TOPICAL LOCAL ANESTHETICS Dose: Frequency / Time: Indication: 1 1 cream topical Number of Refills: Number of Units: 0 Generic Substitution: Start Date: End Date: One Time Use: Substitution Permitted No Note to Pharmacy: Signature(s): Date(s): Electronic Signature(s) Signed: 04/30/2018 12:33:42 PM By: Lawanda Cousins Signed: 05/01/2018 4:06:12 PM By: Alric Quan Entered By: Alric Quan on 04/30/2018 11:01:26 Mcguire, [name] ([id number]) --------------------------------------------------------------------------------  Problem List Details Patient Name: KAIDAN, SPENGLER. Date of Service: 04/30/2018 10:00 AM Medical Record Number: [id number] Patient Account Number: [id number] Date of Birth/Sex: [birth year] (81 y.o. F) Treating RN: Ahmed Prima Primary Care Provider: Fenton Malling Other Clinician: Referring Provider: Fenton Malling Treating Provider/Extender: Cathie Olden in Treatment: 1 Active Problems ICD-10 Evaluated Encounter Code Description Active Date Today Diagnosis S81.812S Laceration without foreign body, left lower leg, sequela 04/23/2018 No Yes S80.212S Abrasion, left knee, sequela 04/23/2018 No Yes S80.211S Abrasion, right knee, sequela 04/23/2018 No Yes Inactive Problems Resolved Problems Electronic Signature(s) Signed: 04/30/2018 11:24:02 AM By: Lawanda Cousins Entered By: Lawanda Cousins on 04/30/2018 11:24:01 Munar, [name]  ([id number]) -------------------------------------------------------------------------------- Progress Note Details Patient Name: [name]. Date of Service: 04/30/2018 10:00 AM Medical Record Number: [id number] Patient Account Number: [id number] Date of Birth/Sex: 03-May-1936 (81 y.o. F) Treating RN: Primary Care Provider: Fenton Malling Other Clinician: Referring Provider: Fenton Malling Treating Provider/Extender: Cathie Olden in Treatment: 1 Subjective Chief Complaint Information obtained from Patient LLE History of Present Illness (HPI) 04/23/18-She is seen in initial evaluation for multiple skin tears s/p fall on 8/19. She did sustain a closed fracture of the right patella secondary to that fall. She presented to emergency department on 8/19 for treatment. The left leg laceration was unable to be sutured closed, Steri-Strips were applied and she was treated with knee immobilizer for the patella fracture. She continues on Keflex that was initiated on 8/19. The left leg skin tear flap was cleansed and reapproximated, secure with steri strips. She will follow up next week 04/30/18-She is seen in follow-up evaluation for multiple skin tears. She continues with knee immobilizers to the right lower extremity, has developed lower extremity edema with subsequent weeping; will initiate ace wrap compression. The flap is mostly devitalized, which was somewhat expected; we will change treatment plan and she will follow up next week Objective Constitutional Vitals Time Taken: 10:08 AM, Height: 60 in, Weight: 151 lbs, BMI: 29.5, Temperature: 98.3 F, Pulse: 60 bpm, Respiratory Rate: 16 breaths/min, Blood Pressure: 120/63 mmHg. Integumentary (Hair, Skin) Wound #2 status is Open. Original cause of wound was Trauma. The wound is located on the Right Knee. The wound measures 1.5cm length x 0.5cm width x 0.1cm depth; 0.589cm^2 area and 0.059cm^3 volume. Wound #3 status is Open.  Original cause of wound was Trauma. The wound is located on the Left,Lateral Lower Leg. The wound measures 12.2cm length x 10cm width x 0.2cm depth; 95.819cm^2 area and 19.164cm^3 volume. Wound #4 status is Open. Original cause of wound was Trauma. The wound is located on the Left,Medial Lower Leg. The wound measures 1cm length x 2cm width x 0.1cm depth; 1.571cm^2 area and 0.157cm^3 volume. Wound #5 status is Open. Original cause of wound was Gradually Appeared. The wound is located on the Right,Medial Lower Leg. The wound measures 0.5cm length  x 0.5cm width x 0.2cm depth; 0.196cm^2 area and 0.039cm^3 volume. There is no tunneling or undermining noted. There is a small amount of serous drainage noted. The wound margin is flat and intact. There is no granulation within the wound bed. There is a large (67-100%) amount of necrotic tissue within the wound bed including Adherent Slough. The periwound skin appearance did not exhibit: Callus, Crepitus, Excoriation, Induration, Rash, Scarring, Dry/Scaly, Maceration, Atrophie Blanche, Cyanosis, Ecchymosis, Hemosiderin Staining, Mottled, Pallor, Rubor, Erythema. [name], [name] ([id number]) Periwound temperature was noted as No Abnormality. The periwound has tenderness on palpation. Wound #6 status is Open. Original cause of wound was Trauma. The wound is located on the Left Knee. The wound measures 1cm length x 0.7cm width x 0.1cm depth; 0.55cm^2 area and 0.055cm^3 volume. There is no tunneling or undermining noted. There is a medium amount of sanguinous drainage noted. The wound margin is flat and intact. There is large (67-100%) red granulation within the wound bed. There is a small (1-33%) amount of necrotic tissue within the wound bed including Eschar. Periwound temperature was noted as No Abnormality. Assessment Active Problems ICD-10 Laceration without foreign body, left lower leg, sequela Abrasion, left knee, sequela Abrasion, right knee,  sequela Plan Wound Cleansing: Wound #2 Right Knee: Clean wound with Normal Saline. Wound #3 Left,Lateral Lower Leg: Clean wound with Normal Saline. Wound #4 Left,Medial Lower Leg: Clean wound with Normal Saline. Wound #5 Right,Medial Lower Leg: Clean wound with Normal Saline. Wound #6 Left Knee: Clean wound with Normal Saline. Anesthetic (add to Medication List): Wound #2 Right Knee: Topical Lidocaine 4% cream applied to wound bed prior to debridement (In Clinic Only). Wound #3 Left,Lateral Lower Leg: Topical Lidocaine 4% cream applied to wound bed prior to debridement (In Clinic Only). Wound #4 Left,Medial Lower Leg: Topical Lidocaine 4% cream applied to wound bed prior to debridement (In Clinic Only). Wound #5 Right,Medial Lower Leg: Topical Lidocaine 4% cream applied to wound bed prior to debridement (In Clinic Only). Wound #6 Left Knee: Topical Lidocaine 4% cream applied to wound bed prior to debridement (In Clinic Only). Skin Barriers/Peri-Wound Care: Wound #2 Right Knee: Skin Prep Wound #6 Left Knee: Skin Prep Primary Wound Dressing: Wound #2 Right Knee: Boardered Foam Dressing Wound #3 Left,Lateral Lower Leg: [name], [name] ([id number]) Iodoflex Wound #4 Left,Medial Lower Leg: Iodoflex Wound #5 Right,Medial Lower Leg: Mepitel One Contact layer Wound #6 Left Knee: Boardered Foam Dressing Secondary Dressing: Wound #3 Left,Lateral Lower Leg: ABD and Kerlix/Conform Wound #4 Left,Medial Lower Leg: ABD and Kerlix/Conform Wound #5 Right,Medial Lower Leg: ABD pad Conform/Kerlix Dressing Change Frequency: Wound #2 Right Knee: Change dressing every other day. Wound #3 Left,Lateral Lower Leg: Change dressing every other day. Wound #4 Left,Medial Lower Leg: Change dressing every other day. Wound #5 Right,Medial Lower Leg: Change dressing every other day. Wound #6 Left Knee: Change dressing every other day. Follow-up Appointments: Wound #2 Right  Knee: Return Appointment in 1 week. Wound #3 Left,Lateral Lower Leg: Return Appointment in 1 week. Wound #4 Left,Medial Lower Leg: Return Appointment in 1 week. Wound #6 Left Knee: Return Appointment in 1 week. Edema Control: Wound #2 Right Knee: Elevate legs to the level of the heart and pump ankles as often as possible Other: - ACE wrap Wound #3 Left,Lateral Lower Leg: Elevate legs to the level of the heart and pump ankles as often as possible Other: - ACE wrap Wound #4 Left,Medial Lower Leg: Elevate legs to the level of the heart and pump ankles  as often as possible Other: - ACE wrap Wound #5 Right,Medial Lower Leg: Elevate legs to the level of the heart and pump ankles as often as possible Other: - ACE wrap Wound #6 Left Knee: Elevate legs to the level of the heart and pump ankles as often as possible Other: - ACE wrap Additional Orders / Instructions: Wound #2 Right Knee: Vitamin A; Vitamin C, Zinc - Please add a multivitamin that has 100% of vitamin A, vitamin C and zinc supplements in it Increase protein intake. Activity as tolerated Wound #3 Left,Lateral Lower Leg: Vitamin A; Vitamin C, Zinc - Please add a multivitamin that has 100% of vitamin A, vitamin C and zinc supplements in it Increase protein intake. Activity as tolerated TASHIRA, TORRE. ([id number]) Wound #4 Left,Medial Lower Leg: Vitamin A; Vitamin C, Zinc - Please add a multivitamin that has 100% of vitamin A, vitamin C and zinc supplements in it Increase protein intake. Activity as tolerated Wound #5 Right,Medial Lower Leg: Vitamin A; Vitamin C, Zinc - Please add a multivitamin that has 100% of vitamin A, vitamin C and zinc supplements in it Increase protein intake. Activity as tolerated Wound #6 Left Knee: Vitamin A; Vitamin C, Zinc - Please add a multivitamin that has 100% of vitamin A, vitamin C and zinc supplements in it Increase protein intake. Activity as tolerated Home Health: Wound #2 Right  Knee: [place] for [employer] Nurse may visit PRN to address patient s wound care needs. FACE TO FACE ENCOUNTER: MEDICARE and MEDICAID PATIENTS: I certify that this patient is under my care and that I had a face-to-face encounter that meets the physician face-to-face encounter requirements with this patient on this date. The encounter with the patient was in whole or in part for the following MEDICAL CONDITION: (primary reason for [place]) MEDICAL NECESSITY: I certify, that based on my findings, NURSING services are a medically necessary home health service. HOME BOUND STATUS: I certify that my clinical findings support that this patient is homebound (i.e., Due to illness or injury, pt requires aid of supportive devices such as crutches, cane, wheelchairs, walkers, the use of special transportation or the assistance of another person to leave their place of residence. There is a normal inability to leave the home and doing so requires considerable and taxing effort. Other absences are for medical reasons / religious services and are infrequent or of short duration when for other reasons). If current dressing causes regression in wound condition, may D/C ordered dressing product/s and apply Normal Saline Moist Dressing daily until next [place] / Other MD appointment. [place] of regression in wound condition at [phone]. Please direct any NON-WOUND related issues/requests for orders to patient's Primary Care Physician Wound #3 Left,Lateral Lower Leg: [place] for [place] Nurse may visit PRN to address patient s wound care needs. FACE TO FACE ENCOUNTER: MEDICARE and MEDICAID PATIENTS: I certify that this patient is under my care and that I had a face-to-face encounter that meets the physician face-to-face encounter requirements with this  patient on this date. The encounter with the patient was in whole or in part for the following MEDICAL CONDITION: (primary reason for [place]) MEDICAL NECESSITY: I certify, that based on my findings, NURSING services are a medically necessary home health service. HOME BOUND STATUS: I certify that my clinical findings support that this patient is homebound (i.e., Due to  illness or injury, pt requires aid of supportive devices such as crutches, cane, wheelchairs, walkers, the use of special transportation or the assistance of another person to leave their place of residence. There is a normal inability to leave the home and doing so requires considerable and taxing effort. Other absences are for medical reasons / religious services and are infrequent or of short duration when for other reasons). If current dressing causes regression in wound condition, may D/C ordered dressing product/s and apply Normal Saline Moist Dressing daily until next [place] / Other MD appointment. [place] of regression in wound condition at [phone]. Please direct any NON-WOUND related issues/requests for orders to patient's Primary Care Physician Wound #4 Left,Medial Lower Leg: [place] for [place] Nurse may visit PRN to address patient s wound care needs. FACE TO FACE ENCOUNTER: MEDICARE and MEDICAID PATIENTS: I certify that this patient is under my care and that I had a face-to-face encounter that meets the physician face-to-face encounter requirements with this patient on this date. The encounter with the patient was in whole or in part for the following MEDICAL CONDITION: (primary reason for [place]) MEDICAL NECESSITY: I certify, that based on my findings, NURSING services are a medically necessary home health service. HOME BOUND STATUS: I certify that my clinical findings support that this patient is  homebound (i.e., Due to illness or injury, pt requires aid of supportive devices such as crutches, cane, wheelchairs, walkers, the use of special transportation or the assistance of another person to leave their place of residence. There is a normal inability to leave the home and doing so requires considerable and taxing effort. Other absences are for medical reasons / religious services and are infrequent or of short duration when for other reasons). PALMINA, CLODFELTER ([id number]) If current dressing causes regression in wound condition, may D/C ordered dressing product/s and apply Normal Saline Moist Dressing daily until next [place] / Other MD appointment. [place] of regression in wound condition at [phone]. Please direct any NON-WOUND related issues/requests for orders to patient's Primary Care Physician Wound #5 Right,Medial Lower Leg: [place] for [place] Nurse may visit PRN to address patient s wound care needs. FACE TO FACE ENCOUNTER: MEDICARE and MEDICAID PATIENTS: I certify that this patient is under my care and that I had a face-to-face encounter that meets the physician face-to-face encounter requirements with this patient on this date. The encounter with the patient was in whole or in part for the following MEDICAL CONDITION: (primary reason for [place]) MEDICAL NECESSITY: I certify, that based on my findings, NURSING services are a medically necessary home health service. HOME BOUND STATUS: I certify that my clinical findings support that this patient is homebound (i.e., Due to illness or injury, pt requires aid of supportive devices such as crutches, cane, wheelchairs, walkers, the use of special transportation or the assistance of another person to leave their place of residence. There is a normal inability to leave the home and doing so requires considerable and taxing  effort. Other absences are for medical reasons / religious services and are infrequent or of short duration when for other reasons). If current dressing causes regression in wound condition, may D/C ordered dressing product/s and apply Normal Saline Moist Dressing daily until next [place] / Other MD appointment. [place] of regression in wound  condition at [phone]. Please direct any NON-WOUND related issues/requests for orders to patient's Primary Care Physician Wound #6 Left Knee: [place] for [place] Nurse may visit PRN to address patient s wound care needs. FACE TO FACE ENCOUNTER: MEDICARE and MEDICAID PATIENTS: I certify that this patient is under my care and that I had a face-to-face encounter that meets the physician face-to-face encounter requirements with this patient on this date. The encounter with the patient was in whole or in part for the following MEDICAL CONDITION: (primary reason for [place]) MEDICAL NECESSITY: I certify, that based on my findings, NURSING services are a medically necessary home health service. HOME BOUND STATUS: I certify that my clinical findings support that this patient is homebound (i.e., Due to illness or injury, pt requires aid of supportive devices such as crutches, cane, wheelchairs, walkers, the use of special transportation or the assistance of another person to leave their place of residence. There is a normal inability to leave the home and doing so requires considerable and taxing effort. Other absences are for medical reasons / religious services and are infrequent or of short duration when for other reasons). If current dressing causes regression in wound condition, may D/C ordered dressing product/s and apply Normal Saline Moist Dressing daily until next [place] / Other MD appointment. [place] of regression  in wound condition at [phone]. Please direct any NON-WOUND related issues/requests for orders to patient's Primary Care Physician The following medication(s) was prescribed: lidocaine topical 4 % cream 1 1 cream topical was prescribed at facility Electronic Signature(s) Signed: 05/07/2018 8:03:43 PM By: Lawanda Cousins Previous Signature: 04/30/2018 12:13:26 PM Version By: Lawanda Cousins Entered By: Lawanda Cousins on 05/07/2018 20:03:43 HAIZLEY, CANNELLA ([id number]) -------------------------------------------------------------------------------- SuperBill Details Patient Name: [name]. Date of Service: 04/30/2018 Medical Record Number: [id number] Patient Account Number: [id number] Date of Birth/Sex: [birth year] (82 y.o. F) Treating RN: Ahmed Prima Primary Care Provider: Fenton Malling Other Clinician: Referring Provider: Fenton Malling Treating Provider/Extender: Lawanda Cousins Weeks in Treatment: 1 Diagnosis Coding ICD-10 Codes Code Description [phone] Laceration without foreign body, left lower leg, sequela S80.212S Abrasion, left knee, sequela S80.211S Abrasion, right knee, sequela Facility Procedures CPT4 Code: [id number] Description: [phone] - WOUND CARE VISIT-LEV 5 EST PT Modifier: Quantity: 1 Physician Procedures CPT4 Code: [id number] Description: 03403 - WC PHYS LEVEL 3 - EST PT ICD-10 Diagnosis Description S81.812S Laceration without foreign body, left lower leg, sequela S80.212S Abrasion, left knee, sequela S80.211S Abrasion, right knee, sequela Modifier: Quantity: 1 Electronic Signature(s) Signed: 04/30/2018 12:13:49 PM By: Lawanda Cousins Entered By: Lawanda Cousins on 04/30/2018 12:13:49

## 2018-05-12 DIAGNOSIS — S81811D Laceration without foreign body, right lower leg, subsequent encounter: Secondary | ICD-10-CM | POA: Diagnosis not present

## 2018-05-12 DIAGNOSIS — S80211D Abrasion, right knee, subsequent encounter: Secondary | ICD-10-CM | POA: Diagnosis not present

## 2018-05-12 DIAGNOSIS — J84112 Idiopathic pulmonary fibrosis: Secondary | ICD-10-CM | POA: Diagnosis not present

## 2018-05-12 DIAGNOSIS — S81812D Laceration without foreign body, left lower leg, subsequent encounter: Secondary | ICD-10-CM | POA: Diagnosis not present

## 2018-05-12 DIAGNOSIS — S80212D Abrasion, left knee, subsequent encounter: Secondary | ICD-10-CM | POA: Diagnosis not present

## 2018-05-12 DIAGNOSIS — S82001D Unspecified fracture of right patella, subsequent encounter for closed fracture with routine healing: Secondary | ICD-10-CM | POA: Diagnosis not present

## 2018-05-13 DIAGNOSIS — M4854XA Collapsed vertebra, not elsewhere classified, thoracic region, initial encounter for fracture: Secondary | ICD-10-CM | POA: Diagnosis not present

## 2018-05-14 ENCOUNTER — Encounter: Payer: Medicare Other | Admitting: Physician Assistant

## 2018-05-14 DIAGNOSIS — S80211S Abrasion, right knee, sequela: Secondary | ICD-10-CM | POA: Diagnosis not present

## 2018-05-14 DIAGNOSIS — J8410 Pulmonary fibrosis, unspecified: Secondary | ICD-10-CM | POA: Diagnosis not present

## 2018-05-14 DIAGNOSIS — S80212S Abrasion, left knee, sequela: Secondary | ICD-10-CM | POA: Diagnosis not present

## 2018-05-14 DIAGNOSIS — L97822 Non-pressure chronic ulcer of other part of left lower leg with fat layer exposed: Secondary | ICD-10-CM | POA: Diagnosis not present

## 2018-05-14 DIAGNOSIS — I10 Essential (primary) hypertension: Secondary | ICD-10-CM | POA: Diagnosis not present

## 2018-05-14 DIAGNOSIS — M1990 Unspecified osteoarthritis, unspecified site: Secondary | ICD-10-CM | POA: Diagnosis not present

## 2018-05-14 DIAGNOSIS — S81812S Laceration without foreign body, left lower leg, sequela: Secondary | ICD-10-CM | POA: Diagnosis not present

## 2018-05-15 DIAGNOSIS — M5416 Radiculopathy, lumbar region: Secondary | ICD-10-CM | POA: Diagnosis not present

## 2018-05-16 DIAGNOSIS — S80211D Abrasion, right knee, subsequent encounter: Secondary | ICD-10-CM | POA: Diagnosis not present

## 2018-05-16 DIAGNOSIS — S82001D Unspecified fracture of right patella, subsequent encounter for closed fracture with routine healing: Secondary | ICD-10-CM | POA: Diagnosis not present

## 2018-05-16 DIAGNOSIS — S80212D Abrasion, left knee, subsequent encounter: Secondary | ICD-10-CM | POA: Diagnosis not present

## 2018-05-16 DIAGNOSIS — S81812D Laceration without foreign body, left lower leg, subsequent encounter: Secondary | ICD-10-CM | POA: Diagnosis not present

## 2018-05-16 DIAGNOSIS — J84112 Idiopathic pulmonary fibrosis: Secondary | ICD-10-CM | POA: Diagnosis not present

## 2018-05-16 DIAGNOSIS — S81811D Laceration without foreign body, right lower leg, subsequent encounter: Secondary | ICD-10-CM | POA: Diagnosis not present

## 2018-05-19 ENCOUNTER — Encounter: Payer: Medicare Other | Admitting: Obstetrics and Gynecology

## 2018-05-19 DIAGNOSIS — S82001D Unspecified fracture of right patella, subsequent encounter for closed fracture with routine healing: Secondary | ICD-10-CM | POA: Diagnosis not present

## 2018-05-19 DIAGNOSIS — S80211D Abrasion, right knee, subsequent encounter: Secondary | ICD-10-CM | POA: Diagnosis not present

## 2018-05-19 DIAGNOSIS — S81812D Laceration without foreign body, left lower leg, subsequent encounter: Secondary | ICD-10-CM | POA: Diagnosis not present

## 2018-05-19 DIAGNOSIS — S80212D Abrasion, left knee, subsequent encounter: Secondary | ICD-10-CM | POA: Diagnosis not present

## 2018-05-19 DIAGNOSIS — J84112 Idiopathic pulmonary fibrosis: Secondary | ICD-10-CM | POA: Diagnosis not present

## 2018-05-19 DIAGNOSIS — S81811D Laceration without foreign body, right lower leg, subsequent encounter: Secondary | ICD-10-CM | POA: Diagnosis not present

## 2018-05-19 NOTE — Progress Notes (Signed)
Reduction in Area: 63.50% 99.80% N/A % Reduction in Volume: 81.80% 99.80% N/A Classification: Full Thickness Without Partial Thickness N/A Exposed Support Structures Exudate Amount: Small Medium N/A Exudate Type: Serosanguineous Serous N/A Exudate Color: red, brown amber N/A Wound Margin: Flat and Intact Flat and Intact N/A Granulation Amount: Small (1-33%) None Present (0%) N/A Granulation Quality: Red N/A N/A Necrotic Amount: Large (67-100%) None Present (0%) N/A Necrotic Tissue: Eschar, Adherent Slough N/A N/A Exposed Structures: Fat Layer (Subcutaneous Fat Layer (Subcutaneous N/A Tissue) Exposed: Yes Tissue) Exposed: Yes Fascia: No Fascia: No Tendon: No Tendon: No Muscle: No Muscle: No [name], [name]. ([id number]) Joint: No Joint: No Bone: No Bone: No Epithelialization: Small (1-33%) None N/A Periwound Skin Texture: Excoriation: No Excoriation: No N/A Induration: No Induration: No Callus: No Callus: No Crepitus: No Crepitus: No Rash: No Rash: No Scarring: No Scarring: No Periwound Skin Moisture: Maceration: No Maceration: No N/A Dry/Scaly: No Dry/Scaly: No Periwound Skin Color: Atrophie Blanche: No Erythema: Yes N/A Cyanosis:  No Atrophie Blanche: No Ecchymosis: No Cyanosis: No Erythema: No Ecchymosis: No Hemosiderin Staining: No Hemosiderin Staining: No Mottled: No Mottled: No Pallor: No Pallor: No Rubor: No Rubor: No Erythema Location: N/A Circumferential N/A Temperature: Hot Hot N/A Tenderness on Palpation: Yes Yes N/A Wound Preparation: Ulcer Cleansing: Ulcer Cleansing: N/A Rinsed/Irrigated with Saline Rinsed/Irrigated with Saline Topical Anesthetic Applied: Topical Anesthetic Applied: Other: lidocaine 4% Other: lidocaine 4% [name] Notes Electronic Signature(s) Signed: 05/15/2018 8:12:43 AM By: [name] Entered By: [name] on 05/14/2018 10:49:22 [name], [name] ([id number]) -------------------------------------------------------------------------------- [facility] Details Patient Name: [name], [name]. Date of Service: 05/14/2018 10:15 AM Medical Record Number: [id number] Patient Account Number: [id number] Date of Birth/Sex: [birth year] (82 y.o. F) Treating RN: [name] Primary Care [name]: [name] Other Clinician: Referring Lewin Pellow: [name] Treating [name]/Extender: [name], [name]: 3 Active Inactive ` Abuse / Safety / Falls / Self Care Management Nursing Diagnoses: Impaired physical mobility Goals: Patient will remain injury free related to falls Date Initiated: 04/23/2018 Target Resolution Date: 07/03/2018 Goal Status: Active Interventions: Assess fall risk on admission and as needed Notes: ` Orientation to the Wound Care Program Nursing Diagnoses: Knowledge deficit related to the wound healing center program Goals: Patient/caregiver will verbalize understanding of the [place] Program Date Initiated: 04/23/2018 Target Resolution Date: 07/03/2018 Goal Status: Active Interventions: Provide education on orientation to the wound center Notes: ` Pain, Acute or  Chronic Nursing Diagnoses: Pain, acute or chronic: actual or potential Goals: Patient/caregiver will verbalize adequate pain control between visits Date Initiated: 04/23/2018 Target Resolution Date: 07/03/2018 Goal Status: Active Interventions: [name] ([id number]) Assess comfort goal upon admission Notes: ` Wound/Skin Impairment Nursing Diagnoses: Impaired tissue integrity Goals: Ulcer/skin breakdown will heal within 14 weeks Date Initiated: 04/23/2018 Target Resolution Date: 07/03/2018 Goal Status: Active Interventions: Assess patient/caregiver ability to obtain necessary supplies Assess patient/caregiver ability to perform ulcer/skin care regimen upon admission and as needed Assess ulceration(s) every visit Notes: Electronic Signature(s) Signed: 05/15/2018 8:12:43 AM By: [name] Entered By: [name] on 05/14/2018 10:49:07 [name] ([id number]) -------------------------------------------------------------------------------- Pain Assessment Details Patient Name: [name]. Date of Service: 05/14/2018 10:15 AM Medical Record Number: [id number] Patient Account Number: [id number] Date of Birth/Sex: September 07, 1935 (82 y.o. F) Treating RN: [name] Primary Care Joah Patlan: [name] Other Clinician: Referring [name]: [name] Treating [name]/Extender: [name], [name]: 3 Active Problems Location of Pain Severity and Description of Pain Patient Has Paino Yes Site Locations  Rate the pain. Current Pain Level: 3 Pain Management and Medication Current Pain Management: Electronic Signature(s) Signed: 05/14/2018 1:01:16 PM By: [name] RCP, RRT, CHT Signed: 05/15/2018 8:12:43 AM By: [name] Entered By: [name] on 05/14/2018 10:17:28 [name], [name]  ([id number]) -------------------------------------------------------------------------------- Patient/Caregiver Education Details Patient Name: [name], [name] Date of Service: 05/14/2018 10:15 AM Medical Record Number: [id number] Patient Account Number: [id number] Date of Birth/Gender: 09-Jul-1936 (82 y.o. F) Treating RN: [name] Primary Care Physician: [name] Other Clinician: Referring Physician: Fenton [name] Treating Physician/Extender: [name] in [name]: 3 Education Assessment Education Provided To: Patient and Caregiver Education Topics Provided Wound/Skin Impairment: Handouts: Other: wound care as ordered Methods: Demonstration, Explain/Verbal Responses: State content correctly Electronic Signature(s) Signed: 05/14/2018 5:06:40 PM By: [name] Entered By: [name] on 05/14/2018 13:02:07 [name] ([id number]) -------------------------------------------------------------------------------- Wound Assessment Details Patient Name: [name], [name]. Date of Service: 05/14/2018 10:15 AM Medical Record Number: [id number] Patient Account Number: [id number] Date of Birth/Sex: 04-13-1936 (82 y.o. F) Treating RN: Secundino Ginger Primary Care Nou Chard: [name] Other Clinician: Referring Dontez Hauss: [name] Treating Noreta Kue/Extender: [name], [name]: 3 Wound Status Wound Number: 3 Primary Etiology: Skin Tear Wound Location: Left Lower Leg - Lateral Wound Status: Open Wounding Event: Trauma Comorbid History: Hypertension, Osteoarthritis Date Acquired: 04/20/2018 Weeks Of [name]: 3 Clustered Wound: No Photos Photo Uploaded By: Secundino Ginger on 05/14/2018 10:45:17 Wound Measurements Length: (cm) 6.5 Width: (cm) 8 Depth: (cm) 0.1 Area: (cm) 40.841 Volume: (cm) 4.084 % Reduction in Area: 63.5% % Reduction in Volume: 81.8% Epithelialization: Small (1-33%) Tunneling:  No Undermining: No Wound Description Full Thickness Without Exposed Support Foul Odo Classification: Structures Slough/F Wound Margin: Flat and Intact Exudate Small Amount: Exudate Type: Serosanguineous Exudate Color: red, brown r After Cleansing: No ibrino No Wound Bed Granulation Amount: Small (1-33%) Exposed Structure Granulation Quality: Red Fascia Exposed: No Necrotic Amount: Large (67-100%) Fat Layer (Subcutaneous Tissue) Exposed: Yes Necrotic Quality: Eschar, Adherent Slough Tendon Exposed: No Muscle Exposed: No Joint Exposed: No Bone Exposed: No Pawloski, Chantea J. ([id number]) Periwound Skin Texture Texture Color No Abnormalities Noted: No No Abnormalities Noted: No Callus: No Atrophie Blanche: No Crepitus: No Cyanosis: No Excoriation: No Ecchymosis: No Induration: No Erythema: No Rash: No Hemosiderin Staining: No Scarring: No Mottled: No Pallor: No Moisture Rubor: No No Abnormalities Noted: No Dry / Scaly: No Temperature / Pain Maceration: No Temperature: Hot Tenderness on Palpation: Yes Wound Preparation Ulcer Cleansing: Rinsed/Irrigated with Saline Topical Anesthetic Applied: Other: lidocaine 4%, [name] Notes Wound #3 (Left, Lateral Lower Leg) 1. Cleansed with: Clean wound with Normal Saline 2. Anesthetic Topical Lidocaine 4% cream to wound bed prior to debridement 4. Dressing Applied: Iodoflex 5. Secondary Dressing Applied ABD Pad Kerlix/Conform Notes ace wrap Electronic Signature(s) Signed: 05/14/2018 12:06:16 PM By: Secundino Ginger Entered By: Secundino Ginger on 05/14/2018 10:33:32 [name], [name] ([id number]) -------------------------------------------------------------------------------- Wound Assessment Details Patient Name: [name], [name]. Date of Service: 05/14/2018 10:15 AM Medical Record Number: [id number] Patient Account Number: [id number] Date of Birth/Sex: 10/10/1935 (82 y.o. F) Treating RN: Secundino Ginger Primary Care  Leighton Brickley: [name] Other Clinician: Referring Lacheryl Niesen: [name] Treating Emonie Espericueta/Extender: [name], [name]: 3 Wound Status Wound Number: 4 Primary Etiology: Skin Tear Wound Location: Left Lower Leg - Medial Wound Status: Open Wounding Event: Trauma Comorbid History: Hypertension, Osteoarthritis Date Acquired: 04/20/2018 Weeks Of [name]: 3 Clustered Wound: No Photos Photo Uploaded By: Secundino Ginger on 05/14/2018 10:45:18 Wound Measurements Length: (  [name], [name] ([id number]) Visit Report for 05/14/2018 Arrival Information Details Patient Name: [name], [name]. Date of Service: 05/14/2018 10:15 AM Medical Record Number: [id number] Patient Account Number: [id number] Date of Birth/Sex: 02/01/1936 (82 y.o. F) Treating RN: [name] Primary Care Amiley Shishido: [name] Other Clinician: Referring Urias Sheek: [name] Treating Romir Klimowicz/Extender: [name], [name]: 3 Visit Information History Since Last Visit Added or deleted any medications: No Patient Arrived: Cane Any new allergies or adverse reactions: No Arrival Time: 10:16 Had a fall or experienced change in No Accompanied By: husband activities of daily living that may affect Transfer Assistance: None risk of falls: Signs or symptoms of abuse/neglect since last visito No Hospitalized since last visit: No Implantable device outside of the clinic excluding No cellular tissue based products placed in the center since last visit: Has Dressing in Place as Prescribed: Yes Pain Present Now: Yes Electronic Signature(s) Signed: 05/14/2018 1:01:16 PM By: [name] RCP, RRT, CHT Entered By: [name] on 05/14/2018 10:17:15 [name] ([id number]) -------------------------------------------------------------------------------- Encounter Discharge Information Details Patient Name: JASHIYA, BASSETT. Date of Service: 05/14/2018 10:15 AM Medical Record Number: [id number] Patient Account Number: [id number] Date of Birth/Sex: [birth year] (82 y.o. F) Treating RN: [name] Primary Care Elliet Goodnow: [name] Other Clinician: Referring Edna Rede: [name] Treating Maimouna Rondeau/Extender: [name], [name]: 3 Encounter Discharge Information Items Discharge Condition: Stable Ambulatory Status: Cane Discharge Destination: Home Transportation: Private Auto Accompanied By:  self Schedule Follow-up Appointment: Yes Clinical Summary of Care: Electronic Signature(s) Signed: 05/14/2018 1:01:49 PM By: [name] Entered By: [name] on 05/14/2018 13:01:49 [name] ([id number]) -------------------------------------------------------------------------------- Lower Extremity Assessment Details Patient Name: CINDEE, MCLESTER. Date of Service: 05/14/2018 10:15 AM Medical Record Number: [id number] Patient Account Number: [id number] Date of Birth/Sex: February 08, 1936 (82 y.o. F) Treating RN: Secundino Ginger Primary Care Avigayil Ton: [name] Other Clinician: Referring Thaddus Mcdowell: [name] Treating Joette Schmoker/Extender: [name], [name]: 3 Edema Assessment Assessed: [Left: No] [Right: No] [Left: Edema] [Right: :] Calf Left: Right: Point of Measurement: 30 cm From Medial Instep 34.5 cm 30.5 cm Ankle Left: Right: Point of Measurement: 10 cm From Medial Instep 22.3 cm 21 cm Vascular Assessment Claudication: Claudication Assessment [Left:None] [Right:None] Pulses: Dorsalis Pedis Palpable: [Left:Yes] [Right:Yes] Posterior Tibial Extremity colors, hair growth, and conditions: Extremity Color: [Left:Normal] [Right:Normal] Capillary Refill: [Left:< 3 seconds] [Right:< 3 seconds] Toe Nail Assessment Left: Right: Thick: No No Discolored: No No Deformed: No No Improper Length and Hygiene: No Electronic Signature(s) Signed: 05/14/2018 12:06:16 PM By: Secundino Ginger Entered By: Secundino Ginger on 05/14/2018 10:37:49 Honor, [name] ([id number]) -------------------------------------------------------------------------------- Multi Wound Chart Details Patient Name: [name]. Date of Service: 05/14/2018 10:15 AM Medical Record Number: [id number] Patient Account Number: [id number] Date of Birth/Sex: Dec 12, 1935 (82 y.o. F) Treating RN: [name] Primary Care Ashanty Coltrane: [name] Other Clinician: Referring  Adrian Specht: [name] Treating Arlean Thies/Extender: [name], [name]: 3 Vital Signs Height(in): 60 Pulse(bpm): 74 Weight(lbs): 151 Blood Pressure(mmHg): 134/60 Body Mass Index(BMI): 29 Temperature(F): 98.5 Respiratory Rate 16 (breaths/min): Photos: [N/A:N/A] Wound Location: Left Lower Leg - Lateral Left Lower Leg - Medial N/A Wounding Event: Trauma Trauma N/A Primary Etiology: Skin Tear Skin Tear N/A Comorbid History: Hypertension, Osteoarthritis Hypertension, Osteoarthritis N/A Date Acquired: 04/20/2018 04/20/2018 N/A Weeks of [name]: 3 3 N/A Wound Status: Open Open N/A Measurements L x W x D 6.5x8x0.1 0.1x0.1x0.1 N/A (cm) Area (cm) : 40.841 0.008 N/A Volume (cm) : 4.084 0.001 N/A %  [name], [name] ([id number]) Visit Report for 05/14/2018 Arrival Information Details Patient Name: [name], [name]. Date of Service: 05/14/2018 10:15 AM Medical Record Number: [id number] Patient Account Number: [id number] Date of Birth/Sex: 02/01/1936 (82 y.o. F) Treating RN: [name] Primary Care Amiley Shishido: [name] Other Clinician: Referring Urias Sheek: [name] Treating Romir Klimowicz/Extender: [name], [name]: 3 Visit Information History Since Last Visit Added or deleted any medications: No Patient Arrived: Cane Any new allergies or adverse reactions: No Arrival Time: 10:16 Had a fall or experienced change in No Accompanied By: husband activities of daily living that may affect Transfer Assistance: None risk of falls: Signs or symptoms of abuse/neglect since last visito No Hospitalized since last visit: No Implantable device outside of the clinic excluding No cellular tissue based products placed in the center since last visit: Has Dressing in Place as Prescribed: Yes Pain Present Now: Yes Electronic Signature(s) Signed: 05/14/2018 1:01:16 PM By: [name] RCP, RRT, CHT Entered By: [name] on 05/14/2018 10:17:15 [name] ([id number]) -------------------------------------------------------------------------------- Encounter Discharge Information Details Patient Name: JASHIYA, BASSETT. Date of Service: 05/14/2018 10:15 AM Medical Record Number: [id number] Patient Account Number: [id number] Date of Birth/Sex: [birth year] (82 y.o. F) Treating RN: [name] Primary Care Elliet Goodnow: [name] Other Clinician: Referring Edna Rede: [name] Treating Maimouna Rondeau/Extender: [name], [name]: 3 Encounter Discharge Information Items Discharge Condition: Stable Ambulatory Status: Cane Discharge Destination: Home Transportation: Private Auto Accompanied By:  self Schedule Follow-up Appointment: Yes Clinical Summary of Care: Electronic Signature(s) Signed: 05/14/2018 1:01:49 PM By: [name] Entered By: [name] on 05/14/2018 13:01:49 [name] ([id number]) -------------------------------------------------------------------------------- Lower Extremity Assessment Details Patient Name: CINDEE, MCLESTER. Date of Service: 05/14/2018 10:15 AM Medical Record Number: [id number] Patient Account Number: [id number] Date of Birth/Sex: February 08, 1936 (82 y.o. F) Treating RN: Secundino Ginger Primary Care Avigayil Ton: [name] Other Clinician: Referring Thaddus Mcdowell: [name] Treating Joette Schmoker/Extender: [name], [name]: 3 Edema Assessment Assessed: [Left: No] [Right: No] [Left: Edema] [Right: :] Calf Left: Right: Point of Measurement: 30 cm From Medial Instep 34.5 cm 30.5 cm Ankle Left: Right: Point of Measurement: 10 cm From Medial Instep 22.3 cm 21 cm Vascular Assessment Claudication: Claudication Assessment [Left:None] [Right:None] Pulses: Dorsalis Pedis Palpable: [Left:Yes] [Right:Yes] Posterior Tibial Extremity colors, hair growth, and conditions: Extremity Color: [Left:Normal] [Right:Normal] Capillary Refill: [Left:< 3 seconds] [Right:< 3 seconds] Toe Nail Assessment Left: Right: Thick: No No Discolored: No No Deformed: No No Improper Length and Hygiene: No Electronic Signature(s) Signed: 05/14/2018 12:06:16 PM By: Secundino Ginger Entered By: Secundino Ginger on 05/14/2018 10:37:49 Honor, [name] ([id number]) -------------------------------------------------------------------------------- Multi Wound Chart Details Patient Name: [name]. Date of Service: 05/14/2018 10:15 AM Medical Record Number: [id number] Patient Account Number: [id number] Date of Birth/Sex: Dec 12, 1935 (82 y.o. F) Treating RN: [name] Primary Care Ashanty Coltrane: [name] Other Clinician: Referring  Adrian Specht: [name] Treating Arlean Thies/Extender: [name], [name]: 3 Vital Signs Height(in): 60 Pulse(bpm): 74 Weight(lbs): 151 Blood Pressure(mmHg): 134/60 Body Mass Index(BMI): 29 Temperature(F): 98.5 Respiratory Rate 16 (breaths/min): Photos: [N/A:N/A] Wound Location: Left Lower Leg - Lateral Left Lower Leg - Medial N/A Wounding Event: Trauma Trauma N/A Primary Etiology: Skin Tear Skin Tear N/A Comorbid History: Hypertension, Osteoarthritis Hypertension, Osteoarthritis N/A Date Acquired: 04/20/2018 04/20/2018 N/A Weeks of [name]: 3 3 N/A Wound Status: Open Open N/A Measurements L x W x D 6.5x8x0.1 0.1x0.1x0.1 N/A (cm) Area (cm) : 40.841 0.008 N/A Volume (cm) : 4.084 0.001 N/A %

## 2018-05-19 NOTE — Progress Notes (Signed)
[name], [name] ([id number]) Visit Report for 05/14/2018 Chief Complaint Document Details Patient Name: [name], [name]. Date of Service: 05/14/2018 10:15 AM Medical Record Number: [id number] Patient Account Number: [id number] Date of Birth/Sex: [birth year] (82 y.o. F) Treating RN: Roger Shelter Primary Care Provider: Fenton Malling Other Clinician: Referring Provider: Fenton Malling Treating Provider/Extender: Melburn Hake, HOYT Weeks in Treatment: 3 Information Obtained from: Patient Chief Complaint LLE Ulcers Electronic Signature(s) Signed: 05/15/2018 8:54:03 AM By: [name] PA-C Entered By: [name] on 05/14/2018 10:43:27 [name], [name] ([id number]) -------------------------------------------------------------------------------- Debridement Details Patient Name: [name] Date of Service: 05/14/2018 10:15 AM Medical Record Number: [id number] Patient Account Number: [id number] Date of Birth/Sex: [birth year] (82 y.o. F) Treating RN: Roger Shelter Primary Care Provider: Fenton Malling Other Clinician: Referring Provider: Fenton Malling Treating Provider/Extender: Melburn Hake, HOYT Weeks in Treatment: 3 Debridement Performed for Wound #3 Left,Lateral Lower Leg Assessment: Performed By: Physician STONE III, HOYT E., PA-C Debridement Type: Debridement Pre-procedure Verification/Time Yes - 10:56 Out Taken: Start Time: 10:56 Pain Control: Other : lidocaine 4% Total Area Debrided (L x W): 6.5 (cm) x 8 (cm) = 52 (cm) Tissue and other material Viable, Non-Viable, Slough, Subcutaneous, Biofilm, Slough debrided: Level: Skin/Subcutaneous Tissue Debridement Description: Excisional Instrument: Curette Bleeding: Minimum Hemostasis Achieved: Pressure End Time: 10:57 Procedural Pain: 0 Post Procedural Pain: 0 Response to Treatment: Procedure was tolerated well Level of Consciousness: Awake and Alert Post Debridement Measurements of  Total Wound Length: (cm) 6.5 Width: (cm) 8 Depth: (cm) 0.2 Volume: (cm) 8.168 Character of Wound/Ulcer Post Debridement: Stable Post Procedure Diagnosis Same as Pre-procedure Electronic Signature(s) Signed: 05/15/2018 8:12:43 AM By: Roger Shelter Signed: 05/15/2018 8:54:03 AM By: [name] PA-C Entered By: Roger Shelter on 05/14/2018 10:57:29 [name], [name] ([id number]) -------------------------------------------------------------------------------- HPI Details Patient Name: [name] Date of Service: 05/14/2018 10:15 AM Medical Record Number: [id number] Patient Account Number: [id number] Date of Birth/Sex: 07/08/1936 (82 y.o. F) Treating RN: Roger Shelter Primary Care Provider: Fenton Malling Other Clinician: Referring Provider: Fenton Malling Treating Provider/Extender: Melburn Hake, HOYT Weeks in Treatment: 3 History of Present Illness HPI Description: 04/23/18-She is seen in initial evaluation for multiple skin tears s/p fall on 8/19. She did sustain a closed fracture of the right patella secondary to that fall. She presented to emergency department on 8/19 for treatment. The left leg laceration was unable to be sutured closed, Steri-Strips were applied and she was treated with knee immobilizer for the patella fracture. She continues on Keflex that was initiated on 8/19. The left leg skin tear flap was cleansed and reapproximated, secure with steri strips. She will follow up next week 04/30/18-She is seen in follow-up evaluation for multiple skin tears. She continues with knee immobilizers to the right lower extremity, has developed lower extremity edema with subsequent weeping; will initiate ace wrap compression. The flap is mostly devitalized, which was somewhat expected; we will change treatment plan and she will follow up next week 05/07/18-She is seen in follow-up evaluation for multiple wounds. The left lateral lower leg skin flap is  nonviable/devitalized, now eschar covered. She tolerated debridement fairly well. The right lower extremity skin tear that was new last week has essentially healed. She has responded nicely to Ace wrap compression. We will modify treatment plan and she will follow-up next week 05/14/18 on evaluation today patient actually appears to be doing a little better in regard to her left lower extremity ulcers. With that being said she has been tolerating the dressing changes without complication.  Medihoney is what she was switch to dear in the last week's visit. Prior to that we used Iodoflex when she had the eschar present. Nonetheless I believe that the Iodoflex may be the best thing for her at this point in regard to the current wound bed and prevention of biofilm buildup. Nonetheless she seems to be tolerating the dressing changes without complication which is good news. It's really the debridement the calls are the most pain last week she did allow for debridement this week she also agreed to allow me to attempt debridement in order to try to help this wound to heal more quickly. Electronic Signature(s) Signed: 05/15/2018 8:54:03 AM By: [name] PA-C Entered By: [name] on 05/14/2018 11:09:23 [name], [name] ([id number]) -------------------------------------------------------------------------------- Physical Exam Details Patient Name: [name], [name]. Date of Service: 05/14/2018 10:15 AM Medical Record Number: [id number] Patient Account Number: [id number] Date of Birth/Sex: 04-04-1936 (82 y.o. F) Treating RN: Roger Shelter Primary Care Provider: Fenton Malling Other Clinician: Referring Provider: Fenton Malling Treating Provider/Extender: Melburn Hake, HOYT Weeks in Treatment: 3 Constitutional Well-nourished and well-hydrated in no acute distress. Respiratory normal breathing without difficulty. Psychiatric this patient is able to make decisions and demonstrates  good insight into disease process. Alert and Oriented x 3. pleasant and cooperative. Notes Patient's wound bed at this point had a significant amount of slough/biofilm noted on the surface of the wound which actually did require sharp debridement today. She is currently being wrapped and I do believe that the wrap is definitely appropriate at this time. Nonetheless she has been tolerating the Medihoney without complication she in fact tolerated the Iodoflex previous as well although I think the Iodoflex is probably gonna be a better choice going forward as far as treatment is concerned. Post debridement the wound bed did appear to be significantly improved. Electronic Signature(s) Signed: 05/15/2018 8:54:03 AM By: [name] PA-C Entered By: [name] on 05/14/2018 11:10:24 [name], [name] ([id number]) -------------------------------------------------------------------------------- Physician Orders Details Patient Name: [name], [name] Date of Service: 05/14/2018 10:15 AM Medical Record Number: [id number] Patient Account Number: [id number] Date of Birth/Sex: 07-08-1936 (82 y.o. F) Treating RN: Roger Shelter Primary Care Provider: Fenton Malling Other Clinician: Referring Provider: Fenton Malling Treating Provider/Extender: Melburn Hake, HOYT Weeks in Treatment: 3 Verbal / Phone Orders: No Diagnosis Coding ICD-10 Coding Code Description [phone] Laceration without foreign body, left lower leg, sequela S80.212S Abrasion, left knee, sequela S80.211S Abrasion, right knee, sequela Wound Cleansing Wound #3 Left,Lateral Lower Leg o Clean wound with Normal Saline. Wound #4 Left,Medial Lower Leg o Clean wound with Normal Saline. Anesthetic (add to Medication List) Wound #3 Left,Lateral Lower Leg o Topical Lidocaine 4% cream applied to wound bed prior to debridement (In Clinic Only). Wound #4 Left,Medial Lower Leg o Topical Lidocaine 4% cream applied to  wound bed prior to debridement (In Clinic Only). Primary Wound Dressing Wound #3 Left,Lateral Lower Leg o Iodoflex Wound #4 Left,Medial Lower Leg o Iodoflex Secondary Dressing Wound #3 Left,Lateral Lower Leg o ABD and Kerlix/Conform - ace wrap Wound #4 Left,Medial Lower Leg o ABD and Kerlix/Conform - ace wrap Dressing Change Frequency Wound #3 Left,Lateral Lower Leg o Change dressing every other day. Wound #4 Left,Medial Lower Leg o Change dressing every other day. Follow-up Appointments MAGON, CROSON ([id number]) Wound #3 Left,Lateral Lower Leg o Return Appointment in 1 week. Wound #4 Left,Medial Lower Leg o Return Appointment in 1 week. Edema Control Wound #3 Left,Lateral Lower Leg o Elevate legs to  the level of the heart and pump ankles as often as possible o Other: - ACE wrap Wound #4 Left,Medial Lower Leg o Elevate legs to the level of the heart and pump ankles as often as possible o Other: - ACE wrap Additional Orders / Instructions Wound #3 Left,Lateral Lower Leg o Vitamin A; Vitamin C, Zinc - Please add a multivitamin that has 100% of vitamin A, vitamin C and zinc supplements in it o Increase protein intake. o Activity as tolerated Wound #4 Left,Medial Lower Leg o Vitamin A; Vitamin C, Zinc - Please add a multivitamin that has 100% of vitamin A, vitamin C and zinc supplements in it o Increase protein intake. o Activity as tolerated Home Health Wound #3 Left,Lateral Lower Leg o [place] for [place] Visits o Home Health Nurse may visit PRN to address patientos wound care needs. o FACE TO FACE ENCOUNTER: MEDICARE and MEDICAID PATIENTS: I certify that this patient is under my care and that I had a face-to-face encounter that meets the physician face-to-face encounter requirements with this patient on this date. The encounter with the patient was in whole or in part for the  following MEDICAL CONDITION: (primary reason for [place]) MEDICAL NECESSITY: I certify, that based on my findings, NURSING services are a medically necessary home health service. HOME BOUND STATUS: I certify that my clinical findings support that this patient is homebound (i.e., Due to illness or injury, pt requires aid of supportive devices such as crutches, cane, wheelchairs, walkers, the use of special transportation or the assistance of another person to leave their place of residence. There is a normal inability to leave the home and doing so requires considerable and taxing effort. Other absences are for medical reasons / religious services and are infrequent or of short duration when for other reasons). o If current dressing causes regression in wound condition, may D/C ordered dressing product/s and apply Normal Saline Moist Dressing daily until next [place] / Other MD appointment. [place] of regression in wound condition at [phone]. o Please direct any NON-WOUND related issues/requests for orders to patient's Primary Care Physician Wound #4 [place] for [place] Nurse may visit PRN to address patientos wound care needs. o FACE TO FACE ENCOUNTER: MEDICARE and MEDICAID PATIENTS: I certify that this patient is under my care and that I had a face-to-face encounter that meets the physician face-to-face encounter requirements with this patient on this date. The encounter with the patient was in whole or in part for the following MEDICAL CONDITION: (primary reason for [place]) MEDICAL NECESSITY: I certify, that based on my findings, DIERA, WIRKKALA ([id number]) NURSING services are a medically necessary home health service. HOME BOUND STATUS: I certify that my clinical findings support that this patient is homebound (i.e., Due to illness  or injury, pt requires aid of supportive devices such as crutches, cane, wheelchairs, walkers, the use of special transportation or the assistance of another person to leave their place of residence. There is a normal inability to leave the home and doing so requires considerable and taxing effort. Other absences are for medical reasons / religious services and are infrequent or of short duration when for other reasons). o If current dressing causes regression in wound condition, may D/C ordered dressing product/s and apply Normal Saline Moist Dressing daily until next [place] / Other  MD appointment. [place] of regression in wound condition at [phone]. o Please direct any NON-WOUND related issues/requests for orders to patient's Primary Care Physician Electronic Signature(s) Signed: 05/15/2018 8:12:43 AM By: Roger Shelter Signed: 05/15/2018 8:54:03 AM By: [name] PA-C Entered By: Roger Shelter on 05/14/2018 11:08:29 [name], [name] ([id number]) -------------------------------------------------------------------------------- Problem List Details Patient Name: [name], [name]. Date of Service: 05/14/2018 10:15 AM Medical Record Number: [id number] Patient Account Number: [id number] Date of Birth/Sex: Jan 23, 1936 (82 y.o. F) Treating RN: Roger Shelter Primary Care Provider: Fenton Malling Other Clinician: Referring Provider: Fenton Malling Treating Provider/Extender: Melburn Hake, HOYT Weeks in Treatment: 3 Active Problems ICD-10 Evaluated Encounter Code Description Active Date Today Diagnosis S81.812S Laceration without foreign body, left lower leg, sequela 04/23/2018 No Yes S80.212S Abrasion, left knee, sequela 04/23/2018 No Yes S80.211S Abrasion, right knee, sequela 04/23/2018 No Yes L97.822 Non-pressure chronic ulcer of other part of left lower leg with 05/14/2018 No Yes fat layer exposed Inactive Problems Resolved  Problems Electronic Signature(s) Signed: 05/15/2018 8:54:03 AM By: [name] PA-C Entered By: [name] on 05/14/2018 11:11:44 [place]field, [name] ([id number]) -------------------------------------------------------------------------------- Progress Note Details Patient Name: [name] Date of Service: 05/14/2018 10:15 AM Medical Record Number: [id number] Patient Account Number: [id number] Date of Birth/Sex: Apr 04, 1936 (82 y.o. F) Treating RN: Roger Shelter Primary Care Provider: Fenton Malling Other Clinician: Referring Provider: Fenton Malling Treating Provider/Extender: Melburn Hake, HOYT Weeks in Treatment: 3 Subjective Chief Complaint Information obtained from Patient LLE Ulcers History of Present Illness (HPI) 04/23/18-She is seen in initial evaluation for multiple skin tears s/p fall on 8/19. She did sustain a closed fracture of the right patella secondary to that fall. She presented to emergency department on 8/19 for treatment. The left leg laceration was unable to be sutured closed, Steri-Strips were applied and she was treated with knee immobilizer for the patella fracture. She continues on Keflex that was initiated on 8/19. The left leg skin tear flap was cleansed and reapproximated, secure with steri strips. She will follow up next week 04/30/18-She is seen in follow-up evaluation for multiple skin tears. She continues with knee immobilizers to the right lower extremity, has developed lower extremity edema with subsequent weeping; will initiate ace wrap compression. The flap is mostly devitalized, which was somewhat expected; we will change treatment plan and she will follow up next week 05/07/18-She is seen in follow-up evaluation for multiple wounds. The left lateral lower leg skin flap is nonviable/devitalized, now eschar covered. She tolerated debridement fairly well. The right lower extremity skin tear that was new last week has essentially  healed. She has responded nicely to Ace wrap compression. We will modify treatment plan and she will follow-up next week 05/14/18 on evaluation today patient actually appears to be doing a little better in regard to her left lower extremity ulcers. With that being said she has been tolerating the dressing changes without complication. Medihoney is what she was switch to dear in the last week's visit. Prior to that we used Iodoflex when she had the eschar present. Nonetheless I believe that the Iodoflex may be the best thing for her at this point in regard to the current wound bed and prevention of biofilm buildup. Nonetheless she seems to be tolerating the dressing changes without complication which is good news. It's really the debridement the calls are the most pain last week she did allow for debridement this week she also agreed to allow me to attempt debridement in order to  try to help this wound to heal more quickly. Patient History Information obtained from Patient. Family History Cancer - Mother, Lung Disease - Siblings, No family history of Diabetes, Heart Disease, Hereditary Spherocytosis, Hypertension, Kidney Disease, Seizures, Stroke, Thyroid Problems, Tuberculosis. Social History Never smoker, Marital Status - Married, Alcohol Use - Never, Drug Use - No History, Caffeine Use - Daily. Medical And Surgical History Notes Respiratory pulmonary fibrosis, home O2 at night and PRN Gastrointestinal diverticulosis [name], [name]. ([id number]) Review of Systems (ROS) Constitutional Symptoms (General Health) Denies complaints or symptoms of Fever, Chills. Respiratory The patient has no complaints or symptoms. Cardiovascular Complains or has symptoms of LE edema. Psychiatric The patient has no complaints or symptoms. Objective Constitutional Well-nourished and well-hydrated in no acute distress. Vitals Time Taken: 10:17 AM, Height: 60 in, Weight: 151 lbs, BMI: 29.5, Temperature:  98.5 F, Pulse: 54 bpm, Respiratory Rate: 16 breaths/min, Blood Pressure: 134/60 mmHg. Respiratory normal breathing without difficulty. Psychiatric this patient is able to make decisions and demonstrates good insight into disease process. Alert and Oriented x 3. pleasant and cooperative. General Notes: Patient's wound bed at this point had a significant amount of slough/biofilm noted on the surface of the wound which actually did require sharp debridement today. She is currently being wrapped and I do believe that the wrap is definitely appropriate at this time. Nonetheless she has been tolerating the Medihoney without complication she in fact tolerated the Iodoflex previous as well although I think the Iodoflex is probably gonna be a better choice going forward as far as treatment is concerned. Post debridement the wound bed did appear to be significantly improved. Integumentary (Hair, Skin) Wound #3 status is Open. Original cause of wound was Trauma. The wound is located on the Left,Lateral Lower Leg. The wound measures 6.5cm length x 8cm width x 0.1cm depth; 40.841cm^2 area and 4.084cm^3 volume. There is Fat Layer (Subcutaneous Tissue) Exposed exposed. There is no tunneling or undermining noted. There is a small amount of serosanguineous drainage noted. The wound margin is flat and intact. There is small (1-33%) red granulation within the wound bed. There is a large (67-100%) amount of necrotic tissue within the wound bed including Eschar and Adherent Slough. The periwound skin appearance did not exhibit: Callus, Crepitus, Excoriation, Induration, Rash, Scarring, Dry/Scaly, Maceration, Atrophie Blanche, Cyanosis, Ecchymosis, Hemosiderin Staining, Mottled, Pallor, Rubor, Erythema. Periwound temperature was noted as Hot. The periwound has tenderness on palpation. Wound #4 status is Open. Original cause of wound was Trauma. The wound is located on the Left,Medial Lower Leg. The wound measures  0.1cm length x 0.1cm width x 0.1cm depth; 0.008cm^2 area and 0.001cm^3 volume. There is Fat Layer (Subcutaneous Tissue) Exposed exposed. There is a medium amount of serous drainage noted. The wound margin is flat and intact. There is no granulation within the wound bed. There is no necrotic tissue within the wound bed. The periwound skin appearance exhibited: Erythema. The periwound skin appearance did not exhibit: Callus, Crepitus, Excoriation, Induration, Rash, Scarring, Dry/Scaly, Maceration, Atrophie Blanche, Cyanosis, Ecchymosis, Hemosiderin Staining, Mottled, Pallor, Rubor. The surrounding wound skin color is noted with erythema which is circumferential. Periwound temperature was noted as Hot. The periwound has tenderness on palpation. [name], [name] ([id number]) Assessment Active Problems ICD-10 Laceration without foreign body, left lower leg, sequela Abrasion, left knee, sequela Abrasion, right knee, sequela Non-pressure chronic ulcer of other part of left lower leg with fat layer exposed Procedures Wound #3 Pre-procedure diagnosis of Wound #3 is a Skin Tear located on  the Left,Lateral Lower Leg . There was a Excisional Skin/Subcutaneous Tissue Debridement with a total area of 52 sq cm performed by STONE III, HOYT E., PA-C. With the following instrument(s): Curette to remove Viable and Non-Viable tissue/material. Material removed includes Subcutaneous Tissue, Slough, and Biofilm after achieving pain control using Other (lidocaine 4%). No specimens were taken. A time out was conducted at 10:56, prior to the start of the procedure. A Minimum amount of bleeding was controlled with Pressure. The procedure was tolerated well with a pain level of 0 throughout and a pain level of 0 following the procedure. Patient s Level of Consciousness post procedure was recorded as Awake and Alert. Post Debridement Measurements: 6.5cm length x 8cm width x 0.2cm depth; 8.168cm^3 volume. Character of  Wound/Ulcer Post Debridement is stable. Post procedure Diagnosis Wound #3: Same as Pre-Procedure Plan Wound Cleansing: Wound #3 Left,Lateral Lower Leg: Clean wound with Normal Saline. Wound #4 Left,Medial Lower Leg: Clean wound with Normal Saline. Anesthetic (add to Medication List): Wound #3 Left,Lateral Lower Leg: Topical Lidocaine 4% cream applied to wound bed prior to debridement (In Clinic Only). Wound #4 Left,Medial Lower Leg: Topical Lidocaine 4% cream applied to wound bed prior to debridement (In Clinic Only). Primary Wound Dressing: Wound #3 Left,Lateral Lower Leg: Iodoflex Wound #4 Left,Medial Lower Leg: Iodoflex Secondary Dressing: Wound #3 Left,Lateral Lower Leg: ABD and Kerlix/Conform - ace wrap Wound #4 Left,Medial Lower Leg: ABD and Kerlix/Conform - ace wrap Toral, Ewelina J. ([id number]) Dressing Change Frequency: Wound #3 Left,Lateral Lower Leg: Change dressing every other day. Wound #4 Left,Medial Lower Leg: Change dressing every other day. Follow-up Appointments: Wound #3 Left,Lateral Lower Leg: Return Appointment in 1 week. Wound #4 Left,Medial Lower Leg: Return Appointment in 1 week. Edema Control: Wound #3 Left,Lateral Lower Leg: Elevate legs to the level of the heart and pump ankles as often as possible Other: - ACE wrap Wound #4 Left,Medial Lower Leg: Elevate legs to the level of the heart and pump ankles as often as possible Other: - ACE wrap Additional Orders / Instructions: Wound #3 Left,Lateral Lower Leg: Vitamin A; Vitamin C, Zinc - Please add a multivitamin that has 100% of vitamin A, vitamin C and zinc supplements in it Increase protein intake. Activity as tolerated Wound #4 Left,Medial Lower Leg: Vitamin A; Vitamin C, Zinc - Please add a multivitamin that has 100% of vitamin A, vitamin C and zinc supplements in it Increase protein intake. Activity as tolerated Home Health: Wound #3 Left,Lateral Lower Leg: [place] for  [place] Nurse may visit PRN to address patient s wound care needs. FACE TO FACE ENCOUNTER: MEDICARE and MEDICAID PATIENTS: I certify that this patient is under my care and that I had a face-to-face encounter that meets the physician face-to-face encounter requirements with this patient on this date. The encounter with the patient was in whole or in part for the following MEDICAL CONDITION: (primary reason for [place]) MEDICAL NECESSITY: I certify, that based on my findings, NURSING services are a medically necessary home health service. HOME BOUND STATUS: I certify that my clinical findings support that this patient is homebound (i.e., Due to illness or injury, pt requires aid of supportive devices such as crutches, cane, wheelchairs, walkers, the use of special transportation or the assistance of another person to leave their place of residence. There is a normal inability to leave the home and doing so requires considerable and taxing effort. Other absences are for medical reasons /  religious services and are infrequent or of short duration when for other reasons). If current dressing causes regression in wound condition, may D/C ordered dressing product/s and apply Normal Saline Moist Dressing daily until next [place] / Other MD appointment. [place] of regression in wound condition at [phone]. Please direct any NON-WOUND related issues/requests for orders to patient's Primary Care Physician Wound #4 Left,Medial Lower Leg: [place] for [place] Nurse may visit PRN to address patient s wound care needs. FACE TO FACE ENCOUNTER: MEDICARE and MEDICAID PATIENTS: I certify that this patient is under my care and that I had a face-to-face encounter that meets the physician face-to-face encounter requirements with this patient on this date.  The encounter with the patient was in whole or in part for the following MEDICAL CONDITION: (primary reason for [place]) MEDICAL NECESSITY: I certify, that based on my findings, NURSING services are a medically necessary home health service. HOME BOUND STATUS: I certify that my clinical findings support that this patient is homebound (i.e., Due to illness or injury, pt requires aid of supportive devices such as crutches, cane, wheelchairs, walkers, the use of special transportation or the assistance of another person to leave their place of residence. There is a normal inability to leave the home and doing so requires considerable and taxing effort. Other absences are for medical reasons / religious services and are infrequent or of short duration when for other reasons). If current dressing causes regression in wound condition, may D/C ordered dressing product/s and apply Normal Saline Moist Dressing daily until next [place] / Other MD appointment. [place] of regression in [place]. ([id number]) wound condition at [phone]. Please direct any NON-WOUND related issues/requests for orders to patient's Primary Care Physician At this point my suggestion is gonna be that we actually continue with the above wound care measures for the next week. She is in agreement with plan. I do believe the Iodoflex again is going to be the most appropriate treatment course and the patient is in agreement that plan. We will subsequently see her back for reevaluation depend on how things progress over the next week. My hope is she will not have as much of the biofilm and slough buildup at that point. Please see above for specific wound care orders. We will see patient for re-evaluation in 1 week(s) here in the clinic. If anything worsens or changes patient will contact our office for additional recommendations. Electronic Signature(s) Signed: 05/15/2018 8:54:03 AM  By: [name] PA-C Entered By: [name] on 05/14/2018 11:12:03 [name], [name] ([id number]) -------------------------------------------------------------------------------- ROS/PFSH Details Patient Name: [name], [name] Date of Service: 05/14/2018 10:15 AM Medical Record Number: [id number] Patient Account Number: [id number] Date of Birth/Sex: [birth year] (82 y.o. F) Treating RN: Roger Shelter Primary Care Provider: Fenton Malling Other Clinician: Referring Provider: Fenton Malling Treating Provider/Extender: Melburn Hake, HOYT Weeks in Treatment: 3 Information Obtained From Patient Wound History Do you currently have one or more open woundso Yes How many open wounds do you currently haveo 2 Approximately how long have you had your woundso 4 days How have you been treating your wound(s) until nowo bandage from the ER Has your wound(s) ever healed and then re-openedo No Have you had any lab work done in the past montho Yes Who ordered the lab work doneo [facility] Have you tested positive for an antibiotic resistant organism (MRSA, VRE)o  No Have you tested positive for osteomyelitis (bone infection)o No Have you had any tests for circulation on your legso No Have you had other problems associated with your woundso Swelling Constitutional Symptoms (General Health) Complaints and Symptoms: Negative for: Fever; Chills Cardiovascular Complaints and Symptoms: Positive for: LE edema Medical History: Positive for: Hypertension Negative for: Angina; Arrhythmia; Congestive Heart Failure; Coronary Artery Disease; Deep Vein Thrombosis; Hypotension; Myocardial Infarction; Peripheral Arterial Disease; Peripheral Venous Disease; Phlebitis; Vasculitis Eyes Medical History: Negative for: Cataracts; Glaucoma; Optic Neuritis Ear/Nose/Mouth/Throat Medical History: Negative for: Chronic sinus problems/congestion; Middle ear problems Hematologic/Lymphatic Medical  History: Negative for: Anemia; Hemophilia; Human Immunodeficiency Virus; Lymphedema; Sickle Cell Disease Respiratory Complaints and Symptoms: No Complaints or Symptoms DENEE, BOEDER ([id number]) Medical History: Negative for: Aspiration; Asthma; Chronic Obstructive Pulmonary Disease (COPD); Pneumothorax; Sleep Apnea; Tuberculosis Past Medical History Notes: pulmonary fibrosis, home O2 at night and PRN Gastrointestinal Medical History: Negative for: Cirrhosis ; Colitis; Crohnos; Hepatitis A; Hepatitis B; Hepatitis C Past Medical History Notes: diverticulosis Endocrine Medical History: Negative for: Type I Diabetes; Type II Diabetes Genitourinary Medical History: Negative for: End Stage Renal Disease Immunological Medical History: Negative for: Lupus Erythematosus; Raynaudos; Scleroderma Integumentary (Skin) Medical History: Negative for: History of Burn; History of pressure wounds Musculoskeletal Medical History: Positive for: Osteoarthritis Negative for: Gout; Rheumatoid Arthritis; Osteomyelitis Neurologic Medical History: Negative for: Dementia; Neuropathy; Quadriplegia; Paraplegia; Seizure Disorder Oncologic Medical History: Negative for: Received Chemotherapy; Received Radiation Psychiatric Complaints and Symptoms: No Complaints or Symptoms Medical History: Negative for: Anorexia/bulimia; Confinement Anxiety Immunizations JADELYN, ELKS ([id number]) Pneumococcal Vaccine: Received Pneumococcal Vaccination: Yes Implantable Devices Family and Social History Cancer: Yes - Mother; Diabetes: No; Heart Disease: No; Hereditary Spherocytosis: No; Hypertension: No; Kidney Disease: No; Lung Disease: Yes - Siblings; Seizures: No; Stroke: No; Thyroid Problems: No; Tuberculosis: No; Never smoker; Marital Status - Married; Alcohol Use: Never; Drug Use: No History; Caffeine Use: Daily; Financial Concerns: No; Food, Clothing or Shelter Needs: No; Support System Lacking:  No; Transportation Concerns: No; Advanced Directives: No; Patient does not want information on Advanced Directives Physician Affirmation I have reviewed and agree with the above information. Electronic Signature(s) Signed: 05/15/2018 8:12:43 AM By: Roger Shelter Signed: 05/15/2018 8:54:03 AM By: [name] PA-C Entered By: [name] on 05/14/2018 11:09:43 MARLYN, TONDREAU ([id number]) -------------------------------------------------------------------------------- SuperBill Details Patient Name: TOKIKO, DIEFENDERFER. Date of Service: 05/14/2018 Medical Record Number: [id number] Patient Account Number: [id number] Date of Birth/Sex: 11-Feb-1936 (82 y.o. F) Treating RN: Roger Shelter Primary Care Provider: Fenton Malling Other Clinician: Referring Provider: Fenton Malling Treating Provider/Extender: Melburn Hake, HOYT Weeks in Treatment: 3 Diagnosis Coding ICD-10 Codes Code Description [phone] Laceration without foreign body, left lower leg, sequela S80.212S Abrasion, left knee, sequela S80.211S Abrasion, right knee, sequela L97.822 Non-pressure chronic ulcer of other part of left lower leg with fat layer exposed Facility Procedures CPT4 Code Description: 57972820 11042 - DEB SUBQ TISSUE 20 SQ CM/< ICD-10 Diagnosis Description L97.822 Non-pressure chronic ulcer of other part of left lower leg with Modifier: fat layer expos Quantity: 1 ed CPT4 Code Description: 60156153 11045 - DEB SUBQ TISS EA ADDL 20CM ICD-10 Diagnosis Description L97.822 Non-pressure chronic ulcer of other part of left lower leg with Modifier: fat layer expos Quantity: 2 ed Physician Procedures CPT4 Code Description: 7943276 14709 - WC PHYS SUBQ TISS 20 SQ CM ICD-10 Diagnosis Description L97.822 Non-pressure chronic ulcer of other part of left lower leg with Modifier: fat layer expos Quantity: 1 ed CPT4 Code Description: [id number] 40370 - WC  PHYS SUBQ TISS EA ADDL 20 CM ICD-10 Diagnosis Description  L97.822 Non-pressure chronic ulcer of other part of left lower leg with Modifier: fat layer expos Quantity: 2 ed Electronic Signature(s) Signed: 05/15/2018 8:54:03 AM By: [name] PA-C Entered By: [name] on 05/14/2018 11:12:17

## 2018-05-20 DIAGNOSIS — S82034D Nondisplaced transverse fracture of right patella, subsequent encounter for closed fracture with routine healing: Secondary | ICD-10-CM | POA: Diagnosis not present

## 2018-05-20 DIAGNOSIS — S82001A Unspecified fracture of right patella, initial encounter for closed fracture: Secondary | ICD-10-CM | POA: Diagnosis not present

## 2018-05-21 ENCOUNTER — Encounter: Payer: Medicare Other | Admitting: Physician Assistant

## 2018-05-21 DIAGNOSIS — J8410 Pulmonary fibrosis, unspecified: Secondary | ICD-10-CM | POA: Diagnosis not present

## 2018-05-21 DIAGNOSIS — S80211S Abrasion, right knee, sequela: Secondary | ICD-10-CM | POA: Diagnosis not present

## 2018-05-21 DIAGNOSIS — S80212S Abrasion, left knee, sequela: Secondary | ICD-10-CM | POA: Diagnosis not present

## 2018-05-21 DIAGNOSIS — L97822 Non-pressure chronic ulcer of other part of left lower leg with fat layer exposed: Secondary | ICD-10-CM | POA: Diagnosis not present

## 2018-05-21 DIAGNOSIS — S81812S Laceration without foreign body, left lower leg, sequela: Secondary | ICD-10-CM | POA: Diagnosis not present

## 2018-05-21 DIAGNOSIS — I10 Essential (primary) hypertension: Secondary | ICD-10-CM | POA: Diagnosis not present

## 2018-05-21 DIAGNOSIS — M1990 Unspecified osteoarthritis, unspecified site: Secondary | ICD-10-CM | POA: Diagnosis not present

## 2018-05-23 DIAGNOSIS — S80211D Abrasion, right knee, subsequent encounter: Secondary | ICD-10-CM | POA: Diagnosis not present

## 2018-05-23 DIAGNOSIS — S80212D Abrasion, left knee, subsequent encounter: Secondary | ICD-10-CM | POA: Diagnosis not present

## 2018-05-23 DIAGNOSIS — S81811D Laceration without foreign body, right lower leg, subsequent encounter: Secondary | ICD-10-CM | POA: Diagnosis not present

## 2018-05-23 DIAGNOSIS — J84112 Idiopathic pulmonary fibrosis: Secondary | ICD-10-CM | POA: Diagnosis not present

## 2018-05-23 DIAGNOSIS — S81812D Laceration without foreign body, left lower leg, subsequent encounter: Secondary | ICD-10-CM | POA: Diagnosis not present

## 2018-05-23 DIAGNOSIS — S82001D Unspecified fracture of right patella, subsequent encounter for closed fracture with routine healing: Secondary | ICD-10-CM | POA: Diagnosis not present

## 2018-05-23 NOTE — Progress Notes (Signed)
X- 1 5 Wound Imaging (photographs - any number of wounds) []  - 0 Wound Tracing (instead of photographs) X- 1 5 Simple Wound Measurement - one wound []  - 0 Complex Wound Measurement - multiple wounds INTERVENTIONS - Wound Dressings []  - Small Wound Dressing one or multiple wounds 0 X- 1 15 Medium Wound Dressing one or multiple wounds []  - 0 Large Wound Dressing one or multiple wounds []  - 0 Application of Medications - topical []  - 0 Application of Medications - injection INTERVENTIONS - Miscellaneous []  - External ear exam 0 []  - 0 Specimen Collection (cultures, biopsies, blood, body fluids, etc.) []  - 0 Specimen(s) / Culture(s) sent or taken to Lab for analysis []  - 0 Patient Transfer (multiple staff / [employer] fast streamer / Similar devices) []  - 0 Simple Staple / Suture removal (25 or less) []  - 0 Complex Staple / Suture removal (26 or more) []  - 0 Hypo / Hyperglycemic Management (close monitor of Blood Glucose) []  - 0 Ankle / Brachial Index (ABI) - do not check if billed separately X- 1 5 Vital Signs [name], Terri J. ([id number]) Has the patient been seen at the hospital within the last three years: Yes Total Score: 90 Level Of Care: New/Established - Level 3 Electronic Signature(s) Signed: 05/21/2018 5:07:26 PM By: Gretta Cool, BSN, RN, CWS, Kim RN, BSN Entered By: Gretta Cool, BSN, RN, CWS, Kim on 05/21/2018 10:29:54 [name] ([id number]) -------------------------------------------------------------------------------- Encounter Discharge Information Details Patient Name: [name], [name]. Date of Service: 05/21/2018 10:15 AM Medical Record Number: [id number] Patient Account Number: [id number] Date of Birth/Sex: [birth year] (82 y.o. F) Treating RN: Cornell Barman Primary Care  Patrecia Veiga: Fenton Malling Other Clinician: Referring Jerricka Carvey: Fenton Malling Treating Justyne Roell/Extender: Melburn Hake, HOYT Weeks in Treatment: 4 Encounter Discharge Information Items Discharge Condition: Stable Ambulatory Status: Ambulatory Discharge Destination: Home Transportation: Private Auto Accompanied By: self Schedule Follow-up Appointment: Yes Clinical Summary of Care: Electronic Signature(s) Signed: 05/21/2018 5:07:26 PM By: Gretta Cool, BSN, RN, CWS, Kim RN, BSN Entered By: Gretta Cool, BSN, RN, CWS, Kim on 05/21/2018 10:33:55 [name] ([id number]) -------------------------------------------------------------------------------- Lower Extremity Assessment Details Patient Name: [name], [name]. Date of Service: 05/21/2018 10:15 AM Medical Record Number: [id number] Patient Account Number: [id number] Date of Birth/Sex: [birth year] (82 y.o. F) Treating RN: Secundino Ginger Primary Care Arlette Schaad: Fenton Malling Other Clinician: Referring Field Staniszewski: Fenton Malling Treating Dmonte Maher/Extender: Melburn Hake, HOYT Weeks in Treatment: 4 Edema Assessment Assessed: [Left: No] [Right: No] [Left: Edema] [Right: :] Calf Left: Right: Point of Measurement: 30 cm From Medial Instep cm cm Ankle Left: Right: Point of Measurement: 10 cm From Medial Instep cm cm Vascular Assessment Pulses: Posterior Tibial Extremity colors, hair growth, and conditions: Extremity Color: [Left:Normal] [Right:Normal] Temperature of Extremity: [Left:Warm] [Right:Warm] Capillary Refill: [Left:< 3 seconds] [Right:< 3 seconds] Toe Nail Assessment Left: Right: Thick: No No Discolored: No No Deformed: No No Improper Length and Hygiene: No No Electronic Signature(s) Signed: 05/21/2018 1:21:42 PM By: Secundino Ginger Entered By: Secundino Ginger on 05/21/2018 10:20:29 [name] ([id number]) -------------------------------------------------------------------------------- Multi Wound Chart Details Patient Name:  [name]. Date of Service: 05/21/2018 10:15 AM Medical Record Number: [id number] Patient Account Number: [id number] Date of Birth/Sex: October 26, 1935 (82 y.o. F) Treating RN: Cornell Barman Primary Care [name]: Fenton Malling Other Clinician: Referring Karaline Buresh: Fenton Malling Treating Zanasia Hickson/Extender: Melburn Hake, HOYT Weeks in Treatment: 4 Vital Signs Height(in): 60 Pulse(bpm): 62 Weight(lbs): 151 Blood Pressure(mmHg): 138/51 Body Mass Index(BMI): 29 Temperature(F): 98.3 Respiratory Rate 16 (breaths/min): Photos: [3:No Photos] [4:No Photos] [N/A:N/A] Wound Location: [3:Left  X- 1 5 Wound Imaging (photographs - any number of wounds) []  - 0 Wound Tracing (instead of photographs) X- 1 5 Simple Wound Measurement - one wound []  - 0 Complex Wound Measurement - multiple wounds INTERVENTIONS - Wound Dressings []  - Small Wound Dressing one or multiple wounds 0 X- 1 15 Medium Wound Dressing one or multiple wounds []  - 0 Large Wound Dressing one or multiple wounds []  - 0 Application of Medications - topical []  - 0 Application of Medications - injection INTERVENTIONS - Miscellaneous []  - External ear exam 0 []  - 0 Specimen Collection (cultures, biopsies, blood, body fluids, etc.) []  - 0 Specimen(s) / Culture(s) sent or taken to Lab for analysis []  - 0 Patient Transfer (multiple staff / [employer] fast streamer / Similar devices) []  - 0 Simple Staple / Suture removal (25 or less) []  - 0 Complex Staple / Suture removal (26 or more) []  - 0 Hypo / Hyperglycemic Management (close monitor of Blood Glucose) []  - 0 Ankle / Brachial Index (ABI) - do not check if billed separately X- 1 5 Vital Signs [name], Terri J. ([id number]) Has the patient been seen at the hospital within the last three years: Yes Total Score: 90 Level Of Care: New/Established - Level 3 Electronic Signature(s) Signed: 05/21/2018 5:07:26 PM By: Gretta Cool, BSN, RN, CWS, Kim RN, BSN Entered By: Gretta Cool, BSN, RN, CWS, Kim on 05/21/2018 10:29:54 [name] ([id number]) -------------------------------------------------------------------------------- Encounter Discharge Information Details Patient Name: [name], [name]. Date of Service: 05/21/2018 10:15 AM Medical Record Number: [id number] Patient Account Number: [id number] Date of Birth/Sex: [birth year] (82 y.o. F) Treating RN: Cornell Barman Primary Care  Patrecia Veiga: Fenton Malling Other Clinician: Referring Jerricka Carvey: Fenton Malling Treating Justyne Roell/Extender: Melburn Hake, HOYT Weeks in Treatment: 4 Encounter Discharge Information Items Discharge Condition: Stable Ambulatory Status: Ambulatory Discharge Destination: Home Transportation: Private Auto Accompanied By: self Schedule Follow-up Appointment: Yes Clinical Summary of Care: Electronic Signature(s) Signed: 05/21/2018 5:07:26 PM By: Gretta Cool, BSN, RN, CWS, Kim RN, BSN Entered By: Gretta Cool, BSN, RN, CWS, Kim on 05/21/2018 10:33:55 [name] ([id number]) -------------------------------------------------------------------------------- Lower Extremity Assessment Details Patient Name: [name], [name]. Date of Service: 05/21/2018 10:15 AM Medical Record Number: [id number] Patient Account Number: [id number] Date of Birth/Sex: [birth year] (82 y.o. F) Treating RN: Secundino Ginger Primary Care Arlette Schaad: Fenton Malling Other Clinician: Referring Field Staniszewski: Fenton Malling Treating Dmonte Maher/Extender: Melburn Hake, HOYT Weeks in Treatment: 4 Edema Assessment Assessed: [Left: No] [Right: No] [Left: Edema] [Right: :] Calf Left: Right: Point of Measurement: 30 cm From Medial Instep cm cm Ankle Left: Right: Point of Measurement: 10 cm From Medial Instep cm cm Vascular Assessment Pulses: Posterior Tibial Extremity colors, hair growth, and conditions: Extremity Color: [Left:Normal] [Right:Normal] Temperature of Extremity: [Left:Warm] [Right:Warm] Capillary Refill: [Left:< 3 seconds] [Right:< 3 seconds] Toe Nail Assessment Left: Right: Thick: No No Discolored: No No Deformed: No No Improper Length and Hygiene: No No Electronic Signature(s) Signed: 05/21/2018 1:21:42 PM By: Secundino Ginger Entered By: Secundino Ginger on 05/21/2018 10:20:29 [name] ([id number]) -------------------------------------------------------------------------------- Multi Wound Chart Details Patient Name:  [name]. Date of Service: 05/21/2018 10:15 AM Medical Record Number: [id number] Patient Account Number: [id number] Date of Birth/Sex: October 26, 1935 (82 y.o. F) Treating RN: Cornell Barman Primary Care [name]: Fenton Malling Other Clinician: Referring Karaline Buresh: Fenton Malling Treating Zanasia Hickson/Extender: Melburn Hake, HOYT Weeks in Treatment: 4 Vital Signs Height(in): 60 Pulse(bpm): 62 Weight(lbs): 151 Blood Pressure(mmHg): 138/51 Body Mass Index(BMI): 29 Temperature(F): 98.3 Respiratory Rate 16 (breaths/min): Photos: [3:No Photos] [4:No Photos] [N/A:N/A] Wound Location: [3:Left  X- 1 5 Wound Imaging (photographs - any number of wounds) []  - 0 Wound Tracing (instead of photographs) X- 1 5 Simple Wound Measurement - one wound []  - 0 Complex Wound Measurement - multiple wounds INTERVENTIONS - Wound Dressings []  - Small Wound Dressing one or multiple wounds 0 X- 1 15 Medium Wound Dressing one or multiple wounds []  - 0 Large Wound Dressing one or multiple wounds []  - 0 Application of Medications - topical []  - 0 Application of Medications - injection INTERVENTIONS - Miscellaneous []  - External ear exam 0 []  - 0 Specimen Collection (cultures, biopsies, blood, body fluids, etc.) []  - 0 Specimen(s) / Culture(s) sent or taken to Lab for analysis []  - 0 Patient Transfer (multiple staff / [employer] fast streamer / Similar devices) []  - 0 Simple Staple / Suture removal (25 or less) []  - 0 Complex Staple / Suture removal (26 or more) []  - 0 Hypo / Hyperglycemic Management (close monitor of Blood Glucose) []  - 0 Ankle / Brachial Index (ABI) - do not check if billed separately X- 1 5 Vital Signs [name], Terri J. ([id number]) Has the patient been seen at the hospital within the last three years: Yes Total Score: 90 Level Of Care: New/Established - Level 3 Electronic Signature(s) Signed: 05/21/2018 5:07:26 PM By: Gretta Cool, BSN, RN, CWS, Kim RN, BSN Entered By: Gretta Cool, BSN, RN, CWS, Kim on 05/21/2018 10:29:54 [name] ([id number]) -------------------------------------------------------------------------------- Encounter Discharge Information Details Patient Name: [name], [name]. Date of Service: 05/21/2018 10:15 AM Medical Record Number: [id number] Patient Account Number: [id number] Date of Birth/Sex: [birth year] (82 y.o. F) Treating RN: Cornell Barman Primary Care  Patrecia Veiga: Fenton Malling Other Clinician: Referring Jerricka Carvey: Fenton Malling Treating Justyne Roell/Extender: Melburn Hake, HOYT Weeks in Treatment: 4 Encounter Discharge Information Items Discharge Condition: Stable Ambulatory Status: Ambulatory Discharge Destination: Home Transportation: Private Auto Accompanied By: self Schedule Follow-up Appointment: Yes Clinical Summary of Care: Electronic Signature(s) Signed: 05/21/2018 5:07:26 PM By: Gretta Cool, BSN, RN, CWS, Kim RN, BSN Entered By: Gretta Cool, BSN, RN, CWS, Kim on 05/21/2018 10:33:55 [name] ([id number]) -------------------------------------------------------------------------------- Lower Extremity Assessment Details Patient Name: [name], [name]. Date of Service: 05/21/2018 10:15 AM Medical Record Number: [id number] Patient Account Number: [id number] Date of Birth/Sex: [birth year] (82 y.o. F) Treating RN: Secundino Ginger Primary Care Arlette Schaad: Fenton Malling Other Clinician: Referring Field Staniszewski: Fenton Malling Treating Dmonte Maher/Extender: Melburn Hake, HOYT Weeks in Treatment: 4 Edema Assessment Assessed: [Left: No] [Right: No] [Left: Edema] [Right: :] Calf Left: Right: Point of Measurement: 30 cm From Medial Instep cm cm Ankle Left: Right: Point of Measurement: 10 cm From Medial Instep cm cm Vascular Assessment Pulses: Posterior Tibial Extremity colors, hair growth, and conditions: Extremity Color: [Left:Normal] [Right:Normal] Temperature of Extremity: [Left:Warm] [Right:Warm] Capillary Refill: [Left:< 3 seconds] [Right:< 3 seconds] Toe Nail Assessment Left: Right: Thick: No No Discolored: No No Deformed: No No Improper Length and Hygiene: No No Electronic Signature(s) Signed: 05/21/2018 1:21:42 PM By: Secundino Ginger Entered By: Secundino Ginger on 05/21/2018 10:20:29 [name] ([id number]) -------------------------------------------------------------------------------- Multi Wound Chart Details Patient Name:  [name]. Date of Service: 05/21/2018 10:15 AM Medical Record Number: [id number] Patient Account Number: [id number] Date of Birth/Sex: October 26, 1935 (82 y.o. F) Treating RN: Cornell Barman Primary Care [name]: Fenton Malling Other Clinician: Referring Karaline Buresh: Fenton Malling Treating Zanasia Hickson/Extender: Melburn Hake, HOYT Weeks in Treatment: 4 Vital Signs Height(in): 60 Pulse(bpm): 62 Weight(lbs): 151 Blood Pressure(mmHg): 138/51 Body Mass Index(BMI): 29 Temperature(F): 98.3 Respiratory Rate 16 (breaths/min): Photos: [3:No Photos] [4:No Photos] [N/A:N/A] Wound Location: [3:Left  X- 1 5 Wound Imaging (photographs - any number of wounds) []  - 0 Wound Tracing (instead of photographs) X- 1 5 Simple Wound Measurement - one wound []  - 0 Complex Wound Measurement - multiple wounds INTERVENTIONS - Wound Dressings []  - Small Wound Dressing one or multiple wounds 0 X- 1 15 Medium Wound Dressing one or multiple wounds []  - 0 Large Wound Dressing one or multiple wounds []  - 0 Application of Medications - topical []  - 0 Application of Medications - injection INTERVENTIONS - Miscellaneous []  - External ear exam 0 []  - 0 Specimen Collection (cultures, biopsies, blood, body fluids, etc.) []  - 0 Specimen(s) / Culture(s) sent or taken to Lab for analysis []  - 0 Patient Transfer (multiple staff / [employer] fast streamer / Similar devices) []  - 0 Simple Staple / Suture removal (25 or less) []  - 0 Complex Staple / Suture removal (26 or more) []  - 0 Hypo / Hyperglycemic Management (close monitor of Blood Glucose) []  - 0 Ankle / Brachial Index (ABI) - do not check if billed separately X- 1 5 Vital Signs [name], Terri J. ([id number]) Has the patient been seen at the hospital within the last three years: Yes Total Score: 90 Level Of Care: New/Established - Level 3 Electronic Signature(s) Signed: 05/21/2018 5:07:26 PM By: Gretta Cool, BSN, RN, CWS, Kim RN, BSN Entered By: Gretta Cool, BSN, RN, CWS, Kim on 05/21/2018 10:29:54 [name] ([id number]) -------------------------------------------------------------------------------- Encounter Discharge Information Details Patient Name: [name], [name]. Date of Service: 05/21/2018 10:15 AM Medical Record Number: [id number] Patient Account Number: [id number] Date of Birth/Sex: [birth year] (82 y.o. F) Treating RN: Cornell Barman Primary Care  Patrecia Veiga: Fenton Malling Other Clinician: Referring Jerricka Carvey: Fenton Malling Treating Justyne Roell/Extender: Melburn Hake, HOYT Weeks in Treatment: 4 Encounter Discharge Information Items Discharge Condition: Stable Ambulatory Status: Ambulatory Discharge Destination: Home Transportation: Private Auto Accompanied By: self Schedule Follow-up Appointment: Yes Clinical Summary of Care: Electronic Signature(s) Signed: 05/21/2018 5:07:26 PM By: Gretta Cool, BSN, RN, CWS, Kim RN, BSN Entered By: Gretta Cool, BSN, RN, CWS, Kim on 05/21/2018 10:33:55 [name] ([id number]) -------------------------------------------------------------------------------- Lower Extremity Assessment Details Patient Name: [name], [name]. Date of Service: 05/21/2018 10:15 AM Medical Record Number: [id number] Patient Account Number: [id number] Date of Birth/Sex: [birth year] (82 y.o. F) Treating RN: Secundino Ginger Primary Care Arlette Schaad: Fenton Malling Other Clinician: Referring Field Staniszewski: Fenton Malling Treating Dmonte Maher/Extender: Melburn Hake, HOYT Weeks in Treatment: 4 Edema Assessment Assessed: [Left: No] [Right: No] [Left: Edema] [Right: :] Calf Left: Right: Point of Measurement: 30 cm From Medial Instep cm cm Ankle Left: Right: Point of Measurement: 10 cm From Medial Instep cm cm Vascular Assessment Pulses: Posterior Tibial Extremity colors, hair growth, and conditions: Extremity Color: [Left:Normal] [Right:Normal] Temperature of Extremity: [Left:Warm] [Right:Warm] Capillary Refill: [Left:< 3 seconds] [Right:< 3 seconds] Toe Nail Assessment Left: Right: Thick: No No Discolored: No No Deformed: No No Improper Length and Hygiene: No No Electronic Signature(s) Signed: 05/21/2018 1:21:42 PM By: Secundino Ginger Entered By: Secundino Ginger on 05/21/2018 10:20:29 [name] ([id number]) -------------------------------------------------------------------------------- Multi Wound Chart Details Patient Name:  [name]. Date of Service: 05/21/2018 10:15 AM Medical Record Number: [id number] Patient Account Number: [id number] Date of Birth/Sex: October 26, 1935 (82 y.o. F) Treating RN: Cornell Barman Primary Care [name]: Fenton Malling Other Clinician: Referring Karaline Buresh: Fenton Malling Treating Zanasia Hickson/Extender: Melburn Hake, HOYT Weeks in Treatment: 4 Vital Signs Height(in): 60 Pulse(bpm): 62 Weight(lbs): 151 Blood Pressure(mmHg): 138/51 Body Mass Index(BMI): 29 Temperature(F): 98.3 Respiratory Rate 16 (breaths/min): Photos: [3:No Photos] [4:No Photos] [N/A:N/A] Wound Location: [3:Left  Lower Leg - Lateral] [4:Left, Medial Lower Leg] [N/A:N/A] Wounding Event: [3:Trauma] [4:Trauma] [N/A:N/A] Primary Etiology: [3:Skin Tear] [4:Skin Tear] [N/A:N/A] Comorbid History: [3:Hypertension, Osteoarthritis] [4:Hypertension, Osteoarthritis] [N/A:N/A] Date Acquired: [3:04/20/2018] [4:04/20/2018] [N/A:N/A] Weeks of Treatment: [3:4] [4:4] [N/A:N/A] Wound Status: [3:Open] [4:Healed - Epithelialized] [N/A:N/A] Measurements L x W x D [3:4.9x6.6x0.1] [4:0x0x0] [N/A:N/A] (cm) Area (cm) : [3:25.4] [4:0] [N/A:N/A] Volume (cm) : [3:2.54] [4:0] [N/A:N/A] % Reduction in Area: [3:77.30%] [4:100.00%] [N/A:N/A] % Reduction in Volume: [3:88.70%] [4:100.00%] [N/A:N/A] Classification: [3:Full Thickness Without Exposed Support Structures] [4:Partial Thickness] [N/A:N/A] Exudate Amount: [3:Small] [4:None Present] [N/A:N/A] Exudate Type: [3:Serosanguineous] [4:N/A] [N/A:N/A] Exudate Color: [3:red, brown] [4:N/A] [N/A:N/A] Wound Margin: [3:Flat and Intact] [4:Flat and Intact] [N/A:N/A] Granulation Amount: [3:Large (67-100%)] [4:None Present (0%)] [N/A:N/A] Granulation Quality: [3:Red] [4:N/A] [N/A:N/A] Necrotic Amount: [3:Small (1-33%)] [4:None Present (0%)] [N/A:N/A] Necrotic Tissue: [3:Eschar, Adherent Slough] [4:N/A] [N/A:N/A] Exposed Structures: [3:Fat Layer (Subcutaneous Tissue) Exposed: Yes Fascia: No  Tendon: No Muscle: No Joint: No Bone: No] [4:Fat Layer (Subcutaneous Tissue) Exposed: Yes Fascia: No Tendon: No Muscle: No Joint: No Bone: No] [N/A:N/A] Epithelialization: [3:Small (1-33%)] [4:None] [N/A:N/A] Periwound Skin Texture: [3:Excoriation: No Induration: No Callus: No Crepitus: No] [4:Excoriation: No Induration: No Callus: No Crepitus: No] [N/A:N/A] Rash: No Rash: No Scarring: No Scarring: No Periwound Skin Moisture: Maceration: No Maceration: No N/A Dry/Scaly: No Dry/Scaly: No Periwound Skin Color: Erythema: Yes Erythema: Yes N/A Atrophie Blanche: No Atrophie Blanche: No Cyanosis: No Cyanosis: No Ecchymosis: No Ecchymosis: No Hemosiderin Staining: No Hemosiderin Staining: No Mottled: No Mottled: No Pallor: No Pallor: No Rubor: No Rubor: No Erythema Location: Circumferential Circumferential N/A Temperature: Hot Hot N/A Tenderness on Palpation: Yes Yes N/A Wound Preparation: Ulcer Cleansing: Ulcer Cleansing: N/A Rinsed/Irrigated with Saline Rinsed/Irrigated with Saline Topical Anesthetic Applied: Topical Anesthetic Applied: Other: lidocaine 4% Other: lidocaine 4% Treatment Notes Electronic Signature(s) Signed: 05/21/2018 5:07:26 PM By: Gretta Cool, BSN, RN, CWS, Kim RN, BSN Entered By: Gretta Cool, BSN, RN, CWS, Kim on 05/21/2018 10:27:50 [name], [name] ([id number]) -------------------------------------------------------------------------------- Multi-Disciplinary Care Plan Details Patient Name: [name], [name]. Date of Service: 05/21/2018 10:15 AM Medical Record Number: [id number] Patient Account Number: [id number] Date of Birth/Sex: [birth year] (82 y.o. F) Treating RN: Cornell Barman Primary Care Alayjah Boehringer: Fenton Malling Other Clinician: Referring Cindie Rajagopalan: Fenton Malling Treating Kasara Schomer/Extender: Melburn Hake, HOYT Weeks in Treatment: 4 Active Inactive ` Abuse / Safety / Falls / Self Care Management Nursing Diagnoses: Impaired physical  mobility Goals: Patient will remain injury free related to falls Date Initiated: 04/23/2018 Target Resolution Date: 07/03/2018 Goal Status: Active Interventions: Assess fall risk on admission and as needed Notes: ` Orientation to the Wound Care Program Nursing Diagnoses: Knowledge deficit related to the wound healing center program Goals: Patient/caregiver will verbalize understanding of the [place] Date Initiated: 04/23/2018 Target Resolution Date: 07/03/2018 Goal Status: Active Interventions: Provide education on orientation to the wound center Notes: ` Pain, Acute or Chronic Nursing Diagnoses: Pain, acute or chronic: actual or potential Goals: Patient/caregiver will verbalize adequate pain control between visits Date Initiated: 04/23/2018 Target Resolution Date: 07/03/2018 Goal Status: Active Interventions: [name], [name] ([id number]) Assess comfort goal upon admission Notes: ` Wound/Skin Impairment Nursing Diagnoses: Impaired tissue integrity Goals: Ulcer/skin breakdown will heal within 14 weeks Date Initiated: 04/23/2018 Target Resolution Date: 07/03/2018 Goal Status: Active Interventions: Assess patient/caregiver ability to obtain necessary supplies Assess patient/caregiver ability to perform ulcer/skin care regimen upon admission and as needed Assess ulceration(s) every visit Notes: Electronic Signature(s) Signed: 05/21/2018 5:07:26 PM By: Gretta Cool, BSN, RN, CWS, Kim RN, BSN Entered By:

## 2018-05-23 NOTE — Progress Notes (Signed)
[name] ([id number]) Visit Report for 05/21/2018 Chief Complaint Document Details Patient Name: [name], [name]. Date of Service: 05/21/2018 10:15 AM Medical Record Number: [id number] Patient Account Number: [id number] Date of Birth/Sex: [birth year] (82 y.o. F) Treating RN: Cornell Barman Primary Care Provider: Fenton Malling Other Clinician: Referring Provider: Fenton Malling Treating Provider/Extender: Melburn Hake, HOYT Weeks in Treatment: 4 Information Obtained from: Patient Chief Complaint LLE Ulcers Electronic Signature(s) Signed: 05/21/2018 4:52:54 PM By: [name] PA-C Entered By: [name] on 05/21/2018 10:13:24 WANDA, RIDEOUT ([id number]) -------------------------------------------------------------------------------- HPI Details Patient Name: [name] Date of Service: 05/21/2018 10:15 AM Medical Record Number: [id number] Patient Account Number: [id number] Date of Birth/Sex: Jul 21, 1936 (82 y.o. F) Treating RN: Cornell Barman Primary Care Provider: Fenton Malling Other Clinician: Referring Provider: Fenton Malling Treating Provider/Extender: Melburn Hake, HOYT Weeks in Treatment: 4 History of Present Illness HPI Description: 04/23/18-She is seen in initial evaluation for multiple skin tears s/p fall on 8/19. She did sustain a closed fracture of the right patella secondary to that fall. She presented to emergency department on 8/19 for treatment. The left leg laceration was unable to be sutured closed, Steri-Strips were applied and she was treated with knee immobilizer for the patella fracture. She continues on Keflex that was initiated on 8/19. The left leg skin tear flap was cleansed and reapproximated, secure with steri strips. She will follow up next week 04/30/18-She is seen in follow-up evaluation for multiple skin tears. She continues with knee immobilizers to the right lower extremity, has developed lower extremity edema with  subsequent weeping; will initiate ace wrap compression. The flap is mostly devitalized, which was somewhat expected; we will change treatment plan and she will follow up next week 05/07/18-She is seen in follow-up evaluation for multiple wounds. The left lateral lower leg skin flap is nonviable/devitalized, now eschar covered. She tolerated debridement fairly well. The right lower extremity skin tear that was new last week has essentially healed. She has responded nicely to Ace wrap compression. We will modify treatment plan and she will follow-up next week 05/14/18 on evaluation today patient actually appears to be doing a little better in regard to her left lower extremity ulcers. With that being said she has been tolerating the dressing changes without complication. Medihoney is what she was switch to dear in the last week's visit. Prior to that we used Iodoflex when she had the eschar present. Nonetheless I believe that the Iodoflex may be the best thing for her at this point in regard to the current wound bed and prevention of biofilm buildup. Nonetheless she seems to be tolerating the dressing changes without complication which is good news. It's really the debridement the calls are the most pain last week she did allow for debridement this week she also agreed to allow me to attempt debridement in order to try to help this wound to heal more quickly. 05/21/18 on evaluation today patient actually appears to be doing much better at this time in regard to her left lateral lower Trinity ulcer. She's been tolerating the dressing changes without complication. Fortunately there does not appear to be any evidence of infection at this time. There is still some Slough noted on the surface of the wound although not as much as previous. She continues to have a lot of discomfort currently. She tells me that once we put the Iodoflex on the last time it actually burned from Thursday till Sunday night and then  eased off. Nonetheless it does  seem to have made a great improvement in general in regard to the patient's wound bed. Electronic Signature(s) Signed: 05/21/2018 4:52:54 PM By: [name] PA-C Entered By: [name] on 05/21/2018 10:34:49 AMYIA, LODWICK ([id number]) -------------------------------------------------------------------------------- Physical Exam Details Patient Name: [name]. Date of Service: 05/21/2018 10:15 AM Medical Record Number: [id number] Patient Account Number: [id number] Date of Birth/Sex: [birth year] (82 y.o. F) Treating RN: Cornell Barman Primary Care Provider: Fenton Malling Other Clinician: Referring Provider: Fenton Malling Treating Provider/Extender: Melburn Hake, HOYT Weeks in Treatment: 4 Constitutional Well-nourished and well-hydrated in no acute distress. Respiratory normal breathing without difficulty. clear to auscultation bilaterally. Cardiovascular regular rate and rhythm with normal S1, S2. Psychiatric this patient is able to make decisions and demonstrates good insight into disease process. Alert and Oriented x 3. pleasant and cooperative. Notes Patient's wound on inspection today did have some Slough noted on the surface of the wound. However she actually had a significant amount of pain last time with debridement and even with mechanical debridement with saline and gauze she was having some difficulty today as well. With that being said I would really like for her to continue with the Iodoflex and to that end I opted to not perform sharp debridement today to see if it will not burn her as badly if we don't first debride the wound. Hopefully it will still help to clear away the slough and necrotic tissue and do so less painfully than what we have to do here in the office. She would preferred this greatly. Electronic Signature(s) Signed: 05/21/2018 4:52:54 PM By: [name] PA-C Entered By: [name] on 05/21/2018  10:35:39 CALYSSA, ZOBRIST ([id number]) -------------------------------------------------------------------------------- Physician Orders Details Patient Name: [name]. Date of Service: 05/21/2018 10:15 AM Medical Record Number: [id number] Patient Account Number: [id number] Date of Birth/Sex: 1935/12/22 (82 y.o. F) Treating RN: Cornell Barman Primary Care Provider: Fenton Malling Other Clinician: Referring Provider: Fenton Malling Treating Provider/Extender: Melburn Hake, HOYT Weeks in Treatment: 4 Verbal / Phone Orders: No Diagnosis Coding ICD-10 Coding Code Description [phone] Laceration without foreign body, left lower leg, sequela S80.212S Abrasion, left knee, sequela S80.211S Abrasion, right knee, sequela L97.822 Non-pressure chronic ulcer of other part of left lower leg with fat layer exposed Wound Cleansing Wound #3 Left,Lateral Lower Leg o Clean wound with Normal Saline. Anesthetic (add to Medication List) Wound #3 Left,Lateral Lower Leg o Topical Lidocaine 4% cream applied to wound bed prior to debridement (In Clinic Only). Primary Wound Dressing Wound #3 Left,Lateral Lower Leg o Iodoflex Secondary Dressing Wound #3 Left,Lateral Lower Leg o ABD and Kerlix/Conform - ace wrap Dressing Change Frequency Wound #3 Left,Lateral Lower Leg o Change dressing every other day. Follow-up Appointments Wound #3 Left,Lateral Lower Leg o Return Appointment in 1 week. Edema Control Wound #3 Left,Lateral Lower Leg o Elevate legs to the level of the heart and pump ankles as often as possible o Other: - ACE wrap Additional Orders / Instructions Wound #3 Left,Lateral Lower Leg o Vitamin A; Vitamin C, Zinc - Please add a multivitamin that has 100% of vitamin A, vitamin C and zinc supplements in it MARQUIA, COSTELLO. ([id number]) o Increase protein intake. o Activity as tolerated Home Health Wound #3 Left,Lateral Lower Leg o [place]  for [place] Visits o Home Health Nurse may visit PRN to address patientos wound care needs. o FACE TO FACE ENCOUNTER: MEDICARE and MEDICAID PATIENTS: I certify that this patient is under  my care and that I had a face-to-face encounter that meets the physician face-to-face encounter requirements with this patient on this date. The encounter with the patient was in whole or in part for the following MEDICAL CONDITION: (primary reason for [place]) MEDICAL NECESSITY: I certify, that based on my findings, NURSING services are a medically necessary home health service. HOME BOUND STATUS: I certify that my clinical findings support that this patient is homebound (i.e., Due to illness or injury, pt requires aid of supportive devices such as crutches, cane, wheelchairs, walkers, the use of special transportation or the assistance of another person to leave their place of residence. There is a normal inability to leave the home and doing so requires considerable and taxing effort. Other absences are for medical reasons / religious services and are infrequent or of short duration when for other reasons). o If current dressing causes regression in wound condition, may D/C ordered dressing product/s and apply Normal Saline Moist Dressing daily until next [place] / Other MD appointment. [place] of regression in wound condition at [phone]. o Please direct any NON-WOUND related issues/requests for orders to patient's Primary Care Physician Electronic Signature(s) Signed: 05/21/2018 4:52:54 PM By: [name] PA-C Signed: 05/21/2018 5:07:26 PM By: Gretta Cool, BSN, RN, CWS, Kim RN, BSN Entered By: Gretta Cool, BSN, RN, CWS, Kim on 05/21/2018 10:29:02 JANITH, NIELSON ([id number]) -------------------------------------------------------------------------------- Problem List Details Patient Name: ONNIE, ALATORRE. Date of Service:  05/21/2018 10:15 AM Medical Record Number: [id number] Patient Account Number: [id number] Date of Birth/Sex: October 16, 1935 (82 y.o. F) Treating RN: Cornell Barman Primary Care Provider: Fenton Malling Other Clinician: Referring Provider: Fenton Malling Treating Provider/Extender: Melburn Hake, HOYT Weeks in Treatment: 4 Active Problems ICD-10 Evaluated Encounter Code Description Active Date Today Diagnosis S81.812S Laceration without foreign body, left lower leg, sequela 04/23/2018 No Yes S80.212S Abrasion, left knee, sequela 04/23/2018 No Yes S80.211S Abrasion, right knee, sequela 04/23/2018 No Yes L97.822 Non-pressure chronic ulcer of other part of left lower leg with 05/14/2018 No Yes fat layer exposed Inactive Problems Resolved Problems Electronic Signature(s) Signed: 05/21/2018 4:52:54 PM By: [name] PA-C Entered By: [name] on 05/21/2018 10:13:11 Neer, [name] ([id number]) -------------------------------------------------------------------------------- Progress Note Details Patient Name: [name] Date of Service: 05/21/2018 10:15 AM Medical Record Number: [id number] Patient Account Number: [id number] Date of Birth/Sex: [birth year] (82 y.o. F) Treating RN: Cornell Barman Primary Care Provider: Fenton Malling Other Clinician: Referring Provider: Fenton Malling Treating Provider/Extender: Melburn Hake, HOYT Weeks in Treatment: 4 Subjective Chief Complaint Information obtained from Patient LLE Ulcers History of Present Illness (HPI) 04/23/18-She is seen in initial evaluation for multiple skin tears s/p fall on 8/19. She did sustain a closed fracture of the right patella secondary to that fall. She presented to emergency department on 8/19 for treatment. The left leg laceration was unable to be sutured closed, Steri-Strips were applied and she was treated with knee immobilizer for the patella fracture. She continues on Keflex that was initiated on 8/19.  The left leg skin tear flap was cleansed and reapproximated, secure with steri strips. She will follow up next week 04/30/18-She is seen in follow-up evaluation for multiple skin tears. She continues with knee immobilizers to the right lower extremity, has developed lower extremity edema with subsequent weeping; will initiate ace wrap compression. The flap is mostly devitalized, which was somewhat expected; we will change treatment plan and she will follow up next week 05/07/18-She is seen in follow-up evaluation for multiple  wounds. The left lateral lower leg skin flap is nonviable/devitalized, now eschar covered. She tolerated debridement fairly well. The right lower extremity skin tear that was new last week has essentially healed. She has responded nicely to Ace wrap compression. We will modify treatment plan and she will follow-up next week 05/14/18 on evaluation today patient actually appears to be doing a little better in regard to her left lower extremity ulcers. With that being said she has been tolerating the dressing changes without complication. Medihoney is what she was switch to dear in the last week's visit. Prior to that we used Iodoflex when she had the eschar present. Nonetheless I believe that the Iodoflex may be the best thing for her at this point in regard to the current wound bed and prevention of biofilm buildup. Nonetheless she seems to be tolerating the dressing changes without complication which is good news. It's really the debridement the calls are the most pain last week she did allow for debridement this week she also agreed to allow me to attempt debridement in order to try to help this wound to heal more quickly. 05/21/18 on evaluation today patient actually appears to be doing much better at this time in regard to her left lateral lower Trinity ulcer. She's been tolerating the dressing changes without complication. Fortunately there does not appear to be any evidence of  infection at this time. There is still some Slough noted on the surface of the wound although not as much as previous. She continues to have a lot of discomfort currently. She tells me that once we put the Iodoflex on the last time it actually burned from Thursday till Sunday night and then eased off. Nonetheless it does seem to have made a great improvement in general in regard to the patient's wound bed. Patient History Information obtained from Patient. Family History Cancer - Mother, Lung Disease - Siblings, No family history of Diabetes, Heart Disease, Hereditary Spherocytosis, Hypertension, Kidney Disease, Seizures, Stroke, Thyroid Problems, Tuberculosis. Social History Never smoker, Marital Status - Married, Alcohol Use - Never, Drug Use - No History, Caffeine Use - Daily. HAIDEE, STOGSDILL ([id number]) Medical And Surgical History Notes Respiratory pulmonary fibrosis, home O2 at night and PRN Gastrointestinal diverticulosis Review of Systems (ROS) Constitutional Symptoms (General Health) Denies complaints or symptoms of Fever, Chills. Respiratory The patient has no complaints or symptoms. Cardiovascular The patient has no complaints or symptoms. Psychiatric The patient has no complaints or symptoms. Objective Constitutional Well-nourished and well-hydrated in no acute distress. Vitals Time Taken: 10:00 AM, Height: 60 in, Weight: 151 lbs, BMI: 29.5, Temperature: 98.3 F, Pulse: 62 bpm, Respiratory Rate: 16 breaths/min, Blood Pressure: 138/51 mmHg. Respiratory normal breathing without difficulty. clear to auscultation bilaterally. Cardiovascular regular rate and rhythm with normal S1, S2. Psychiatric this patient is able to make decisions and demonstrates good insight into disease process. Alert and Oriented x 3. pleasant and cooperative. General Notes: Patient's wound on inspection today did have some Slough noted on the surface of the wound. However she actually had  a significant amount of pain last time with debridement and even with mechanical debridement with saline and gauze she was having some difficulty today as well. With that being said I would really like for her to continue with the Iodoflex and to that end I opted to not perform sharp debridement today to see if it will not burn her as badly if we don't first debride the wound. Hopefully it will still help to clear  away the slough and necrotic tissue and do so less painfully than what we have to do here in the office. She would preferred this greatly. Integumentary (Hair, Skin) Wound #3 status is Open. Original cause of wound was Trauma. The wound is located on the Left,Lateral Lower Leg. The wound measures 4.9cm length x 6.6cm width x 0.1cm depth; 25.4cm^2 area and 2.54cm^3 volume. There is Fat Layer (Subcutaneous Tissue) Exposed exposed. There is a small amount of serosanguineous drainage noted. The wound margin is flat and intact. There is large (67-100%) red granulation within the wound bed. There is a small (1-33%) amount of necrotic tissue within the wound bed including Eschar and Adherent Slough. The periwound skin appearance exhibited: Erythema. The periwound skin appearance did not exhibit: Callus, Crepitus, Excoriation, Induration, Rash, Scarring, Dry/Scaly, Sliwinski, Bobbie J. ([id number]) Maceration, Atrophie Blanche, Cyanosis, Ecchymosis, Hemosiderin Staining, Mottled, Pallor, Rubor. The surrounding wound skin color is noted with erythema which is circumferential. Periwound temperature was noted as Hot. The periwound has tenderness on palpation. Wound #4 status is Healed - Epithelialized. Original cause of wound was Trauma. The wound is located on the Left,Medial Lower Leg. The wound measures 0cm length x 0cm width x 0cm depth; 0cm^2 area and 0cm^3 volume. There is Fat Layer (Subcutaneous Tissue) Exposed exposed. There is no tunneling or undermining noted. There is a none present amount  of drainage noted. The wound margin is flat and intact. There is no granulation within the wound bed. There is no necrotic tissue within the wound bed. The periwound skin appearance exhibited: Erythema. The periwound skin appearance did not exhibit: Callus, Crepitus, Excoriation, Induration, Rash, Scarring, Dry/Scaly, Maceration, Atrophie Blanche, Cyanosis, Ecchymosis, Hemosiderin Staining, Mottled, Pallor, Rubor. The surrounding wound skin color is noted with erythema which is circumferential. Periwound temperature was noted as Hot. The periwound has tenderness on palpation. Assessment Active Problems ICD-10 Laceration without foreign body, left lower leg, sequela Abrasion, left knee, sequela Abrasion, right knee, sequela Non-pressure chronic ulcer of other part of left lower leg with fat layer exposed Plan Wound Cleansing: Wound #3 Left,Lateral Lower Leg: Clean wound with Normal Saline. Anesthetic (add to Medication List): Wound #3 Left,Lateral Lower Leg: Topical Lidocaine 4% cream applied to wound bed prior to debridement (In Clinic Only). Primary Wound Dressing: Wound #3 Left,Lateral Lower Leg: Iodoflex Secondary Dressing: Wound #3 Left,Lateral Lower Leg: ABD and Kerlix/Conform - ace wrap Dressing Change Frequency: Wound #3 Left,Lateral Lower Leg: Change dressing every other day. Follow-up Appointments: Wound #3 Left,Lateral Lower Leg: Return Appointment in 1 week. Edema Control: Wound #3 Left,Lateral Lower Leg: Elevate legs to the level of the heart and pump ankles as often as possible Other: - ACE wrap Additional Orders / Instructions: Wound #3 Left,Lateral Lower Leg: Vitamin A; Vitamin C, Zinc - Please add a multivitamin that has 100% of vitamin A, vitamin C and zinc supplements in it DARIUS, FILLINGIM J. ([id number]) Increase protein intake. Activity as tolerated Home Health: Wound #3 Left,Lateral Lower Leg: [place] for [place] Nurse may visit PRN to address patient s wound care needs. FACE TO FACE ENCOUNTER: MEDICARE and MEDICAID PATIENTS: I certify that this patient is under my care and that I had a face-to-face encounter that meets the physician face-to-face encounter requirements with this patient on this date. The encounter with the patient was in whole or in part for the following MEDICAL CONDITION: (primary reason for [place]) MEDICAL NECESSITY: I certify, that based on my findings,  NURSING services are a medically necessary home health service. HOME BOUND STATUS: I certify that my clinical findings support that this patient is homebound (i.e., Due to illness or injury, pt requires aid of supportive devices such as crutches, cane, wheelchairs, walkers, the use of special transportation or the assistance of another person to leave their place of residence. There is a normal inability to leave the home and doing so requires considerable and taxing effort. Other absences are for medical reasons / religious services and are infrequent or of short duration when for other reasons). If current dressing causes regression in wound condition, may D/C ordered dressing product/s and apply Normal Saline Moist Dressing daily until next [place] / Other MD appointment. [place] of regression in wound condition at [phone]. Please direct any NON-WOUND related issues/requests for orders to patient's Primary Care Physician I'm gonna see the patient back for reevaluation to see were things stand. Hopefully she will show signs of improvement with the above measures. If anything changes or worsens she will let me know. Please see above for specific wound care orders. We will see patient for re-evaluation in 1 week(s) here in the clinic. If anything worsens or changes patient will contact our office for additional recommendations. Electronic Signature(s) Signed:  05/21/2018 4:52:54 PM By: [name] PA-C Entered By: [name] on 05/21/2018 10:36:00 MELEAH, DEMEYER ([id number]) -------------------------------------------------------------------------------- ROS/PFSH Details Patient Name: FREDI, HURTADO. Date of Service: 05/21/2018 10:15 AM Medical Record Number: [id number] Patient Account Number: [id number] Date of Birth/Sex: 1936-06-06 (82 y.o. F) Treating RN: Cornell Barman Primary Care Provider: Fenton Malling Other Clinician: Referring Provider: Fenton Malling Treating Provider/Extender: Melburn Hake, HOYT Weeks in Treatment: 4 Information Obtained From Patient Wound History Do you currently have one or more open woundso Yes How many open wounds do you currently haveo 2 Approximately how long have you had your woundso 4 days How have you been treating your wound(s) until nowo bandage from the ER Has your wound(s) ever healed and then re-openedo No Have you had any lab work done in the past montho Yes Who ordered the lab work doneo [facility] ED Have you tested positive for an antibiotic resistant organism (MRSA, VRE)o No Have you tested positive for osteomyelitis (bone infection)o No Have you had any tests for circulation on your legso No Have you had other problems associated with your woundso Swelling Constitutional Symptoms (General Health) Complaints and Symptoms: Negative for: Fever; Chills Eyes Medical History: Negative for: Cataracts; Glaucoma; Optic Neuritis Ear/Nose/Mouth/Throat Medical History: Negative for: Chronic sinus problems/congestion; Middle ear problems Hematologic/Lymphatic Medical History: Negative for: Anemia; Hemophilia; Human Immunodeficiency Virus; Lymphedema; Sickle Cell Disease Respiratory Complaints and Symptoms: No Complaints or Symptoms Medical History: Negative for: Aspiration; Asthma; Chronic Obstructive Pulmonary Disease (COPD); Pneumothorax; Sleep Apnea; Tuberculosis Past Medical  History Notes: pulmonary fibrosis, home O2 at night and PRN Cardiovascular PRIYAH, SCHMUCK ([id number]) Complaints and Symptoms: No Complaints or Symptoms Medical History: Positive for: Hypertension Negative for: Angina; Arrhythmia; Congestive Heart Failure; Coronary Artery Disease; Deep Vein Thrombosis; Hypotension; Myocardial Infarction; Peripheral Arterial Disease; Peripheral Venous Disease; Phlebitis; Vasculitis Gastrointestinal Medical History: Negative for: Cirrhosis ; Colitis; Crohnos; Hepatitis A; Hepatitis B; Hepatitis C Past Medical History Notes: diverticulosis Endocrine Medical History: Negative for: Type I Diabetes; Type II Diabetes Genitourinary Medical History: Negative for: End Stage Renal Disease Immunological Medical History: Negative for: Lupus Erythematosus; Raynaudos; Scleroderma Integumentary (Skin) Medical History: Negative for: History of Burn; History of pressure wounds Musculoskeletal Medical History:  Positive for: Osteoarthritis Negative for: Gout; Rheumatoid Arthritis; Osteomyelitis Neurologic Medical History: Negative for: Dementia; Neuropathy; Quadriplegia; Paraplegia; Seizure Disorder Oncologic Medical History: Negative for: Received Chemotherapy; Received Radiation Psychiatric Complaints and Symptoms: No Complaints or Symptoms Medical History: Negative for: Anorexia/bulimia; Confinement Anxiety COZETTE, BRAGGS. ([id number]) Immunizations Pneumococcal Vaccine: Received Pneumococcal Vaccination: Yes Implantable Devices Family and Social History Cancer: Yes - Mother; Diabetes: No; Heart Disease: No; Hereditary Spherocytosis: No; Hypertension: No; Kidney Disease: No; Lung Disease: Yes - Siblings; Seizures: No; Stroke: No; Thyroid Problems: No; Tuberculosis: No; Never smoker; Marital Status - Married; Alcohol Use: Never; Drug Use: No History; Caffeine Use: Daily; Financial Concerns: No; Food, Clothing or Shelter Needs: No; Support  System Lacking: No; Transportation Concerns: No; Advanced Directives: No; Patient does not want information on Advanced Directives Physician Affirmation I have reviewed and agree with the above information. Electronic Signature(s) Signed: 05/21/2018 4:52:54 PM By: [name] PA-C Signed: 05/21/2018 5:07:26 PM By: Gretta Cool BSN, RN, CWS, Kim RN, BSN Entered By: [name] on 05/21/2018 10:35:07 LAQUEISHA, CATALINA ([id number]) -------------------------------------------------------------------------------- SuperBill Details Patient Name: CORTNI, TAYS. Date of Service: 05/21/2018 Medical Record Number: [id number] Patient Account Number: [id number] Date of Birth/Sex: 04-Oct-1935 (82 y.o. F) Treating RN: Cornell Barman Primary Care Provider: Fenton Malling Other Clinician: Referring Provider: Fenton Malling Treating Provider/Extender: Melburn Hake, HOYT Weeks in Treatment: 4 Diagnosis Coding ICD-10 Codes Code Description [phone] Laceration without foreign body, left lower leg, sequela S80.212S Abrasion, left knee, sequela S80.211S Abrasion, right knee, sequela L97.822 Non-pressure chronic ulcer of other part of left lower leg with fat layer exposed Facility Procedures CPT4 Code: 51102111 Description: [facility] VISIT-LEV 3 EST PT Modifier: Quantity: 1 Physician Procedures CPT4 Code Description: [id number] - WC PHYS LEVEL 3 - EST PT ICD-10 Diagnosis Description S81.812S Laceration without foreign body, left lower leg, sequela S80.212S Abrasion, left knee, sequela S80.211S Abrasion, right knee, sequela L97.822  Non-pressure chronic ulcer of other part of left lower leg wit Modifier: h fat layer expos Quantity: 1 ed Electronic Signature(s) Signed: 05/21/2018 4:52:54 PM By: [name] PA-C Entered By: [name] on 05/21/2018 10:36:33

## 2018-05-26 DIAGNOSIS — J84112 Idiopathic pulmonary fibrosis: Secondary | ICD-10-CM | POA: Diagnosis not present

## 2018-05-26 DIAGNOSIS — S80211D Abrasion, right knee, subsequent encounter: Secondary | ICD-10-CM | POA: Diagnosis not present

## 2018-05-26 DIAGNOSIS — S80212D Abrasion, left knee, subsequent encounter: Secondary | ICD-10-CM | POA: Diagnosis not present

## 2018-05-26 DIAGNOSIS — S81811D Laceration without foreign body, right lower leg, subsequent encounter: Secondary | ICD-10-CM | POA: Diagnosis not present

## 2018-05-26 DIAGNOSIS — S82001D Unspecified fracture of right patella, subsequent encounter for closed fracture with routine healing: Secondary | ICD-10-CM | POA: Diagnosis not present

## 2018-05-26 DIAGNOSIS — S81812D Laceration without foreign body, left lower leg, subsequent encounter: Secondary | ICD-10-CM | POA: Diagnosis not present

## 2018-05-28 ENCOUNTER — Encounter: Payer: Medicare Other | Admitting: Physician Assistant

## 2018-05-28 DIAGNOSIS — S81812S Laceration without foreign body, left lower leg, sequela: Secondary | ICD-10-CM | POA: Diagnosis not present

## 2018-05-28 DIAGNOSIS — S80211S Abrasion, right knee, sequela: Secondary | ICD-10-CM | POA: Diagnosis not present

## 2018-05-28 DIAGNOSIS — L97822 Non-pressure chronic ulcer of other part of left lower leg with fat layer exposed: Secondary | ICD-10-CM | POA: Diagnosis not present

## 2018-05-28 DIAGNOSIS — S80212S Abrasion, left knee, sequela: Secondary | ICD-10-CM | POA: Diagnosis not present

## 2018-05-28 DIAGNOSIS — I10 Essential (primary) hypertension: Secondary | ICD-10-CM | POA: Diagnosis not present

## 2018-05-28 DIAGNOSIS — M1990 Unspecified osteoarthritis, unspecified site: Secondary | ICD-10-CM | POA: Diagnosis not present

## 2018-05-28 DIAGNOSIS — J8410 Pulmonary fibrosis, unspecified: Secondary | ICD-10-CM | POA: Diagnosis not present

## 2018-05-30 DIAGNOSIS — S80211D Abrasion, right knee, subsequent encounter: Secondary | ICD-10-CM | POA: Diagnosis not present

## 2018-05-30 DIAGNOSIS — S81812D Laceration without foreign body, left lower leg, subsequent encounter: Secondary | ICD-10-CM | POA: Diagnosis not present

## 2018-05-30 DIAGNOSIS — S80212D Abrasion, left knee, subsequent encounter: Secondary | ICD-10-CM | POA: Diagnosis not present

## 2018-05-30 DIAGNOSIS — J84112 Idiopathic pulmonary fibrosis: Secondary | ICD-10-CM | POA: Diagnosis not present

## 2018-05-30 DIAGNOSIS — S81811D Laceration without foreign body, right lower leg, subsequent encounter: Secondary | ICD-10-CM | POA: Diagnosis not present

## 2018-05-30 DIAGNOSIS — S82001D Unspecified fracture of right patella, subsequent encounter for closed fracture with routine healing: Secondary | ICD-10-CM | POA: Diagnosis not present

## 2018-05-31 NOTE — Progress Notes (Signed)
Number: [id number] Patient Account Number: [id number] Date of Birth/Sex: [birth year] (82 y.o. F) Treating RN: Roger Shelter Primary Care Jayden Kratochvil: Fenton Malling Other Clinician: Referring My Rinke: Fenton Malling Treating Cathline Dowen/Extender: Melburn Hake, HOYT Weeks in Treatment: 5 Active Problems Location of Pain Severity and Description of Pain Patient Has Paino No Site Locations Pain Management and Medication Current Pain Management: Electronic Signature(s) Signed: 05/28/2018 4:55:29 PM By: Lorine Bears RCP, RRT, CHT Signed: 05/29/2018 4:22:48 PM By: Roger Shelter Entered By: Lorine Bears on 05/28/2018 10:13:53 [name], [name] ([id number]) -------------------------------------------------------------------------------- Patient/Caregiver Education Details Patient Name: [name], [name]. Date of Service: 05/28/2018 10:15 AM Medical Record Number: [id number] Patient Account Number: [id number] Date of Birth/Gender: September 22, 1935 (82 y.o. F) Treating RN: Roger Shelter Primary Care Physician: Fenton Malling Other Clinician: Referring Physician: Fenton Malling Treating Physician/Extender: Sharalyn Ink in Treatment: 5 Education Assessment Education Provided To: Patient Education Topics Provided Wound Debridement: Handouts: Wound Debridement Methods: Explain/Verbal Responses: State content correctly Wound/Skin Impairment: Handouts: Caring for Your Ulcer Methods: Explain/Verbal Responses: State content correctly Electronic Signature(s) Signed: 05/29/2018 4:22:48 PM By: Roger Shelter Entered By: Roger Shelter on 05/28/2018 10:49:21 [name], [name] ([id number]) -------------------------------------------------------------------------------- Wound Assessment Details Patient Name: [name], [name]. Date of Service: 05/28/2018 10:15 AM Medical Record Number: [id number] Patient Account Number: [id number] Date of Birth/Sex: 06/28/1936 (82 y.o. F) Treating RN: Secundino Ginger Primary Care Lester Platas: Fenton Malling Other Clinician: Referring Sameena Artus: Fenton Malling Treating Artemisa Sladek/Extender: Melburn Hake, HOYT Weeks in Treatment: 5 Wound Status Wound Number: 3 Primary Etiology: Skin Tear Wound Location: Left Lower Leg - Lateral Wound Status: Open Wounding Event: Trauma Comorbid History: Hypertension, Osteoarthritis Date Acquired: 04/20/2018 Weeks Of Treatment: 5 Clustered Wound: No Photos Photo Uploaded By: Secundino Ginger on  05/28/2018 10:40:17 Wound Measurements Length: (cm) 4.9 Width: (cm) 6.2 Depth: (cm) 0.1 Area: (cm) 23.86 Volume: (cm) 2.386 % Reduction in Area: 78.7% % Reduction in Volume: 89.3% Epithelialization: Small (1-33%) Tunneling: No Undermining: No Wound Description Full Thickness Without Exposed Support Foul Odo Classification: Structures Slough/F Wound Margin: Flat and Intact Exudate Small Amount: Exudate Type: Serosanguineous Exudate Color: red, brown r After Cleansing: No ibrino Yes Wound Bed Granulation Amount: Large (67-100%) Exposed Structure Granulation Quality: Red, Hyper-granulation Fascia Exposed: No Necrotic Amount: Small (1-33%) Fat Layer (Subcutaneous Tissue) Exposed: Yes Necrotic Quality: Eschar, Adherent Slough Tendon Exposed: No Muscle Exposed: No Joint Exposed: No Bone Exposed: No Katzenstein, Stephanny J. ([id number]) Periwound Skin Texture Texture Color No Abnormalities Noted: No No Abnormalities Noted: No Callus: No Atrophie Blanche: No Crepitus: No Cyanosis: No Excoriation: No Ecchymosis: No Induration: No Erythema: No Rash: No Hemosiderin Staining: No Scarring: No Mottled: No Pallor: No Moisture Rubor: No No Abnormalities Noted: No Dry / Scaly: No Temperature / Pain Maceration: No Temperature: Hot Tenderness on Palpation: Yes Wound Preparation Ulcer Cleansing: Rinsed/Irrigated with Saline Topical Anesthetic Applied: Other: lidocaine 4%, Treatment Notes Wound #3 (Left, Lateral Lower Leg) Notes Hydrafera Blue, abd, conform secured with ace wrap Electronic Signature(s) Signed: 05/28/2018 2:29:34 PM By: Secundino Ginger Entered By: Secundino Ginger on 05/28/2018 10:34:20 [name], [name] ([id number]) -------------------------------------------------------------------------------- Vitals Details Patient Name: [name], [name]. Date of Service: 05/28/2018 10:15 AM Medical Record Number: [id number] Patient Account Number: [id number] Date of  Birth/Sex: [birth year] (82 y.o. F) Treating RN: Roger Shelter Primary Care Takaya Hyslop: Fenton Malling Other Clinician: Referring Starlee Corralejo: Fenton Malling Treating Rumaysa Sabatino/Extender: Melburn Hake, HOYT Weeks in Treatment: 5 Vital Signs Time Taken: 10:15 Temperature (F): 98.0 Height (in): 60 Pulse (bpm): 52 Weight (lbs): 151 Respiratory Rate (breaths/min):  Number: [id number] Patient Account Number: [id number] Date of Birth/Sex: [birth year] (82 y.o. F) Treating RN: Roger Shelter Primary Care Jayden Kratochvil: Fenton Malling Other Clinician: Referring My Rinke: Fenton Malling Treating Cathline Dowen/Extender: Melburn Hake, HOYT Weeks in Treatment: 5 Active Problems Location of Pain Severity and Description of Pain Patient Has Paino No Site Locations Pain Management and Medication Current Pain Management: Electronic Signature(s) Signed: 05/28/2018 4:55:29 PM By: Lorine Bears RCP, RRT, CHT Signed: 05/29/2018 4:22:48 PM By: Roger Shelter Entered By: Lorine Bears on 05/28/2018 10:13:53 [name], [name] ([id number]) -------------------------------------------------------------------------------- Patient/Caregiver Education Details Patient Name: [name], [name]. Date of Service: 05/28/2018 10:15 AM Medical Record Number: [id number] Patient Account Number: [id number] Date of Birth/Gender: September 22, 1935 (82 y.o. F) Treating RN: Roger Shelter Primary Care Physician: Fenton Malling Other Clinician: Referring Physician: Fenton Malling Treating Physician/Extender: Sharalyn Ink in Treatment: 5 Education Assessment Education Provided To: Patient Education Topics Provided Wound Debridement: Handouts: Wound Debridement Methods: Explain/Verbal Responses: State content correctly Wound/Skin Impairment: Handouts: Caring for Your Ulcer Methods: Explain/Verbal Responses: State content correctly Electronic Signature(s) Signed: 05/29/2018 4:22:48 PM By: Roger Shelter Entered By: Roger Shelter on 05/28/2018 10:49:21 [name], [name] ([id number]) -------------------------------------------------------------------------------- Wound Assessment Details Patient Name: [name], [name]. Date of Service: 05/28/2018 10:15 AM Medical Record Number: [id number] Patient Account Number: [id number] Date of Birth/Sex: 06/28/1936 (82 y.o. F) Treating RN: Secundino Ginger Primary Care Lester Platas: Fenton Malling Other Clinician: Referring Sameena Artus: Fenton Malling Treating Artemisa Sladek/Extender: Melburn Hake, HOYT Weeks in Treatment: 5 Wound Status Wound Number: 3 Primary Etiology: Skin Tear Wound Location: Left Lower Leg - Lateral Wound Status: Open Wounding Event: Trauma Comorbid History: Hypertension, Osteoarthritis Date Acquired: 04/20/2018 Weeks Of Treatment: 5 Clustered Wound: No Photos Photo Uploaded By: Secundino Ginger on  05/28/2018 10:40:17 Wound Measurements Length: (cm) 4.9 Width: (cm) 6.2 Depth: (cm) 0.1 Area: (cm) 23.86 Volume: (cm) 2.386 % Reduction in Area: 78.7% % Reduction in Volume: 89.3% Epithelialization: Small (1-33%) Tunneling: No Undermining: No Wound Description Full Thickness Without Exposed Support Foul Odo Classification: Structures Slough/F Wound Margin: Flat and Intact Exudate Small Amount: Exudate Type: Serosanguineous Exudate Color: red, brown r After Cleansing: No ibrino Yes Wound Bed Granulation Amount: Large (67-100%) Exposed Structure Granulation Quality: Red, Hyper-granulation Fascia Exposed: No Necrotic Amount: Small (1-33%) Fat Layer (Subcutaneous Tissue) Exposed: Yes Necrotic Quality: Eschar, Adherent Slough Tendon Exposed: No Muscle Exposed: No Joint Exposed: No Bone Exposed: No Katzenstein, Stephanny J. ([id number]) Periwound Skin Texture Texture Color No Abnormalities Noted: No No Abnormalities Noted: No Callus: No Atrophie Blanche: No Crepitus: No Cyanosis: No Excoriation: No Ecchymosis: No Induration: No Erythema: No Rash: No Hemosiderin Staining: No Scarring: No Mottled: No Pallor: No Moisture Rubor: No No Abnormalities Noted: No Dry / Scaly: No Temperature / Pain Maceration: No Temperature: Hot Tenderness on Palpation: Yes Wound Preparation Ulcer Cleansing: Rinsed/Irrigated with Saline Topical Anesthetic Applied: Other: lidocaine 4%, Treatment Notes Wound #3 (Left, Lateral Lower Leg) Notes Hydrafera Blue, abd, conform secured with ace wrap Electronic Signature(s) Signed: 05/28/2018 2:29:34 PM By: Secundino Ginger Entered By: Secundino Ginger on 05/28/2018 10:34:20 [name], [name] ([id number]) -------------------------------------------------------------------------------- Vitals Details Patient Name: [name], [name]. Date of Service: 05/28/2018 10:15 AM Medical Record Number: [id number] Patient Account Number: [id number] Date of  Birth/Sex: [birth year] (82 y.o. F) Treating RN: Roger Shelter Primary Care Takaya Hyslop: Fenton Malling Other Clinician: Referring Starlee Corralejo: Fenton Malling Treating Rumaysa Sabatino/Extender: Melburn Hake, HOYT Weeks in Treatment: 5 Vital Signs Time Taken: 10:15 Temperature (F): 98.0 Height (in): 60 Pulse (bpm): 52 Weight (lbs): 151 Respiratory Rate (breaths/min):  Open N/A N/A Measurements L x W x D 4.9x6.2x0.1 N/A N/A (cm) Area (cm) : 23.86 N/A N/A Volume (cm) : 2.386 N/A N/A % Reduction in Area: 78.70% N/A N/A % Reduction in Volume: 89.30% N/A N/A Classification: Full Thickness Without N/A N/A Exposed Support Structures Exudate Amount: Small N/A N/A Exudate Type: Serosanguineous N/A N/A Exudate Color: red, brown N/A N/A Wound Margin: Flat and Intact N/A N/A Granulation Amount: Large (67-100%) N/A N/A Granulation Quality: Red, Hyper-granulation N/A N/A Necrotic Amount: Small (1-33%) N/A N/A Necrotic Tissue: Eschar, Adherent Slough N/A N/A Exposed Structures: Fat Layer (Subcutaneous N/A N/A Tissue) Exposed: Yes Fascia: No Tendon: No Muscle: No [name], [name]. ([id number]) Joint: No Bone: No Epithelialization: Small (1-33%) N/A N/A Debridement: Debridement - Excisional N/A N/A Pre-procedure 10:44 N/A N/A Verification/Time Out Taken: Pain Control: Other N/A N/A Tissue Debrided: Necrotic/Eschar, N/A N/A Subcutaneous, Slough Level: Skin/Subcutaneous Tissue N/A N/A Debridement Area (sq cm): 7.75 N/A N/A Instrument: Curette N/A N/A Bleeding: Minimum N/A N/A Hemostasis  Achieved: Pressure N/A N/A Procedural Pain: 0 N/A N/A Post Procedural Pain: 0 N/A N/A Debridement Treatment Procedure was tolerated well N/A N/A Response: Post Debridement 4.9x6.2x0.2 N/A N/A Measurements L x W x D (cm) Post Debridement Volume: 4.772 N/A N/A (cm) Periwound Skin Texture: Excoriation: No N/A N/A Induration: No Callus: No Crepitus: No Rash: No Scarring: No Periwound Skin Moisture: Maceration: No N/A N/A Dry/Scaly: No Periwound Skin Color: Atrophie Blanche: No N/A N/A Cyanosis: No Ecchymosis: No Erythema: No Hemosiderin Staining: No Mottled: No Pallor: No Rubor: No Temperature: Hot N/A N/A Tenderness on Palpation: Yes N/A N/A Wound Preparation: Ulcer Cleansing: N/A N/A Rinsed/Irrigated with Saline Topical Anesthetic Applied: Other: lidocaine 4% Procedures Performed: Debridement N/A N/A Treatment Notes Electronic Signature(s) Signed: 05/29/2018 4:22:48 PM By: Roger Shelter Entered By: Roger Shelter on 05/28/2018 10:49:42 [name], [name] ([id number]) -------------------------------------------------------------------------------- Multi-Disciplinary Care Plan Details Patient Name: [name], [name]. Date of Service: 05/28/2018 10:15 AM Medical Record Number: [id number] Patient Account Number: [id number] Date of Birth/Sex: 1935-09-18 (82 y.o. F) Treating RN: Roger Shelter Primary Care Carley Glendenning: Fenton Malling Other Clinician: Referring Sharanda Shinault: Fenton Malling Treating Kooper Chriswell/Extender: Melburn Hake, HOYT Weeks in Treatment: 5 Active Inactive ` Abuse / Safety / Falls / Self Care Management Nursing Diagnoses: Impaired physical mobility Goals: Patient will remain injury free related to falls Date Initiated: 04/23/2018 Target Resolution Date: 07/03/2018 Goal Status: Active Interventions: Assess fall risk on admission and as needed Notes: ` Orientation to the Wound Care Program Nursing Diagnoses: Knowledge deficit related to the  wound healing center program Goals: Patient/caregiver will verbalize understanding of the [facility] Date Initiated: 04/23/2018 Target Resolution Date: 07/03/2018 Goal Status: Active Interventions: Provide education on orientation to the wound center Notes: ` Pain, Acute or Chronic Nursing Diagnoses: Pain, acute or chronic: actual or potential Goals: Patient/caregiver will verbalize adequate pain control between visits Date Initiated: 04/23/2018 Target Resolution Date: 07/03/2018 Goal Status: Active Interventions: [name], [name] ([id number]) Assess comfort goal upon admission Notes: ` Wound/Skin Impairment Nursing Diagnoses: Impaired tissue integrity Goals: Ulcer/skin breakdown will heal within 14 weeks Date Initiated: 04/23/2018 Target Resolution Date: 07/03/2018 Goal Status: Active Interventions: Assess patient/caregiver ability to obtain necessary supplies Assess patient/caregiver ability to perform ulcer/skin care regimen upon admission and as needed Assess ulceration(s) every visit Notes: Electronic Signature(s) Signed: 05/29/2018 4:22:48 PM By: Roger Shelter Entered By: Roger Shelter on 05/28/2018 10:44:19 Krejci, [name] ([id number]) -------------------------------------------------------------------------------- Pain Assessment Details Patient Name: [name], [name]. Date of Service: 05/28/2018 10:15 AM Medical Record  Open N/A N/A Measurements L x W x D 4.9x6.2x0.1 N/A N/A (cm) Area (cm) : 23.86 N/A N/A Volume (cm) : 2.386 N/A N/A % Reduction in Area: 78.70% N/A N/A % Reduction in Volume: 89.30% N/A N/A Classification: Full Thickness Without N/A N/A Exposed Support Structures Exudate Amount: Small N/A N/A Exudate Type: Serosanguineous N/A N/A Exudate Color: red, brown N/A N/A Wound Margin: Flat and Intact N/A N/A Granulation Amount: Large (67-100%) N/A N/A Granulation Quality: Red, Hyper-granulation N/A N/A Necrotic Amount: Small (1-33%) N/A N/A Necrotic Tissue: Eschar, Adherent Slough N/A N/A Exposed Structures: Fat Layer (Subcutaneous N/A N/A Tissue) Exposed: Yes Fascia: No Tendon: No Muscle: No [name], [name]. ([id number]) Joint: No Bone: No Epithelialization: Small (1-33%) N/A N/A Debridement: Debridement - Excisional N/A N/A Pre-procedure 10:44 N/A N/A Verification/Time Out Taken: Pain Control: Other N/A N/A Tissue Debrided: Necrotic/Eschar, N/A N/A Subcutaneous, Slough Level: Skin/Subcutaneous Tissue N/A N/A Debridement Area (sq cm): 7.75 N/A N/A Instrument: Curette N/A N/A Bleeding: Minimum N/A N/A Hemostasis  Achieved: Pressure N/A N/A Procedural Pain: 0 N/A N/A Post Procedural Pain: 0 N/A N/A Debridement Treatment Procedure was tolerated well N/A N/A Response: Post Debridement 4.9x6.2x0.2 N/A N/A Measurements L x W x D (cm) Post Debridement Volume: 4.772 N/A N/A (cm) Periwound Skin Texture: Excoriation: No N/A N/A Induration: No Callus: No Crepitus: No Rash: No Scarring: No Periwound Skin Moisture: Maceration: No N/A N/A Dry/Scaly: No Periwound Skin Color: Atrophie Blanche: No N/A N/A Cyanosis: No Ecchymosis: No Erythema: No Hemosiderin Staining: No Mottled: No Pallor: No Rubor: No Temperature: Hot N/A N/A Tenderness on Palpation: Yes N/A N/A Wound Preparation: Ulcer Cleansing: N/A N/A Rinsed/Irrigated with Saline Topical Anesthetic Applied: Other: lidocaine 4% Procedures Performed: Debridement N/A N/A Treatment Notes Electronic Signature(s) Signed: 05/29/2018 4:22:48 PM By: Roger Shelter Entered By: Roger Shelter on 05/28/2018 10:49:42 [name], [name] ([id number]) -------------------------------------------------------------------------------- Multi-Disciplinary Care Plan Details Patient Name: [name], [name]. Date of Service: 05/28/2018 10:15 AM Medical Record Number: [id number] Patient Account Number: [id number] Date of Birth/Sex: 1935-09-18 (82 y.o. F) Treating RN: Roger Shelter Primary Care Carley Glendenning: Fenton Malling Other Clinician: Referring Sharanda Shinault: Fenton Malling Treating Kooper Chriswell/Extender: Melburn Hake, HOYT Weeks in Treatment: 5 Active Inactive ` Abuse / Safety / Falls / Self Care Management Nursing Diagnoses: Impaired physical mobility Goals: Patient will remain injury free related to falls Date Initiated: 04/23/2018 Target Resolution Date: 07/03/2018 Goal Status: Active Interventions: Assess fall risk on admission and as needed Notes: ` Orientation to the Wound Care Program Nursing Diagnoses: Knowledge deficit related to the  wound healing center program Goals: Patient/caregiver will verbalize understanding of the [facility] Date Initiated: 04/23/2018 Target Resolution Date: 07/03/2018 Goal Status: Active Interventions: Provide education on orientation to the wound center Notes: ` Pain, Acute or Chronic Nursing Diagnoses: Pain, acute or chronic: actual or potential Goals: Patient/caregiver will verbalize adequate pain control between visits Date Initiated: 04/23/2018 Target Resolution Date: 07/03/2018 Goal Status: Active Interventions: [name], [name] ([id number]) Assess comfort goal upon admission Notes: ` Wound/Skin Impairment Nursing Diagnoses: Impaired tissue integrity Goals: Ulcer/skin breakdown will heal within 14 weeks Date Initiated: 04/23/2018 Target Resolution Date: 07/03/2018 Goal Status: Active Interventions: Assess patient/caregiver ability to obtain necessary supplies Assess patient/caregiver ability to perform ulcer/skin care regimen upon admission and as needed Assess ulceration(s) every visit Notes: Electronic Signature(s) Signed: 05/29/2018 4:22:48 PM By: Roger Shelter Entered By: Roger Shelter on 05/28/2018 10:44:19 Krejci, [name] ([id number]) -------------------------------------------------------------------------------- Pain Assessment Details Patient Name: [name], [name]. Date of Service: 05/28/2018 10:15 AM Medical Record

## 2018-05-31 NOTE — Progress Notes (Signed)
ALETHIA, MELENDREZ ([id number]) Visit Report for 05/28/2018 Chief Complaint Document Details Patient Name: [name], [name]. Date of Service: 05/28/2018 10:15 AM Medical Record Number: [id number] Patient Account Number: [id number] Date of Birth/Sex: 1936-02-09 (82 y.o. F) Treating RN: Roger Shelter Primary Care Provider: Fenton Malling Other Clinician: Referring Provider: Fenton Malling Treating Provider/Extender: Melburn Hake, HOYT Weeks in Treatment: 5 Information Obtained from: Patient Chief Complaint LLE Ulcers Electronic Signature(s) Signed: 05/28/2018 3:07:42 PM By: [name] PA-C Entered By: [name] on 05/28/2018 10:40:06 [name], [name] ([id number]) -------------------------------------------------------------------------------- Debridement Details Patient Name: [name] Date of Service: 05/28/2018 10:15 AM Medical Record Number: [id number] Patient Account Number: [id number] Date of Birth/Sex: [birth year] (82 y.o. F) Treating RN: Roger Shelter Primary Care Provider: Fenton Malling Other Clinician: Referring Provider: Fenton Malling Treating Provider/Extender: Melburn Hake, HOYT Weeks in Treatment: 5 Debridement Performed for Wound #3 Left,Lateral Lower Leg Assessment: Performed By: Physician STONE III, HOYT E., PA-C Debridement Type: Debridement Level of Consciousness (Pre- Awake and Alert procedure): Pre-procedure Verification/Time Yes - 10:44 Out Taken: Start Time: 10:44 Pain Control: Other : lidocaine 4% Total Area Debrided (L x W): 2.5 (cm) x 3.1 (cm) = 7.75 (cm) Tissue and other material Viable, Non-Viable, Eschar, Slough, Subcutaneous, Biofilm, Slough debrided: Level: Skin/Subcutaneous Tissue Debridement Description: Excisional Instrument: Curette Bleeding: Minimum Hemostasis Achieved: Pressure End Time: 10:47 Procedural Pain: 0 Post Procedural Pain: 0 Response to Treatment: Procedure was tolerated  well Level of Consciousness Awake and Alert (Post-procedure): Post Debridement Measurements of Total Wound Length: (cm) 4.9 Width: (cm) 6.2 Depth: (cm) 0.2 Volume: (cm) 4.772 Character of Wound/Ulcer Post Debridement: Stable Post Procedure Diagnosis Same as Pre-procedure Electronic Signature(s) Signed: 05/28/2018 3:07:42 PM By: [name] PA-C Signed: 05/29/2018 4:22:48 PM By: Roger Shelter Entered By: Roger Shelter on 05/28/2018 10:46:51 [name], [name] ([id number]) -------------------------------------------------------------------------------- HPI Details Patient Name: [name], [name]. Date of Service: 05/28/2018 10:15 AM Medical Record Number: [id number] Patient Account Number: [id number] Date of Birth/Sex: July 13, 1936 (82 y.o. F) Treating RN: Roger Shelter Primary Care Provider: Fenton Malling Other Clinician: Referring Provider: Fenton Malling Treating Provider/Extender: Melburn Hake, HOYT Weeks in Treatment: 5 History of Present Illness HPI Description: 04/23/18-She is seen in initial evaluation for multiple skin tears s/p fall on 8/19. She did sustain a closed fracture of the right patella secondary to that fall. She presented to emergency department on 8/19 for treatment. The left leg laceration was unable to be sutured closed, Steri-Strips were applied and she was treated with knee immobilizer for the patella fracture. She continues on Keflex that was initiated on 8/19. The left leg skin tear flap was cleansed and reapproximated, secure with steri strips. She will follow up next week 04/30/18-She is seen in follow-up evaluation for multiple skin tears. She continues with knee immobilizers to the right lower extremity, has developed lower extremity edema with subsequent weeping; will initiate ace wrap compression. The flap is mostly devitalized, which was somewhat expected; we will change treatment plan and she will follow up next week 05/07/18-She is  seen in follow-up evaluation for multiple wounds. The left lateral lower leg skin flap is nonviable/devitalized, now eschar covered. She tolerated debridement fairly well. The right lower extremity skin tear that was new last week has essentially healed. She has responded nicely to Ace wrap compression. We will modify treatment plan and she will follow-up next week 05/14/18 on evaluation today patient actually appears to be doing a little better in regard to her left lower extremity ulcers. With that being  said she has been tolerating the dressing changes without complication. Medihoney is what she was switch to dear in the last week's visit. Prior to that we used Iodoflex when she had the eschar present. Nonetheless I believe that the Iodoflex may be the best thing for her at this point in regard to the current wound bed and prevention of biofilm buildup. Nonetheless she seems to be tolerating the dressing changes without complication which is good news. It's really the debridement the calls are the most pain last week she did allow for debridement this week she also agreed to allow me to attempt debridement in order to try to help this wound to heal more quickly. 05/21/18 on evaluation today patient actually appears to be doing much better at this time in regard to her left lateral lower Trinity ulcer. She's been tolerating the dressing changes without complication. Fortunately there does not appear to be any evidence of infection at this time. There is still some Slough noted on the surface of the wound although not as much as previous. She continues to have a lot of discomfort currently. She tells me that once we put the Iodoflex on the last time it actually burned from Thursday till Sunday night and then eased off. Nonetheless it does seem to have made a great improvement in general in regard to the patient's wound bed. 05/28/18 patient's wound bed at this point actually show signs of good  improvement which is excellent news currently. She has been tolerating the dressing changes without complication. She does have a little bit of slightly hyper granular tissue at this point there's also some necrotic tissue noted on the surface of the wound. There does not appear to be any evidence of infection overall at this time. In general I feel like that she has made good progress in the past two weeks that I have been seeing her. Electronic Signature(s) Signed: 05/28/2018 3:07:42 PM By: [name] PA-C Entered By: [name] on 05/28/2018 13:36:16 [name], [name] ([id number]) -------------------------------------------------------------------------------- Physical Exam Details Patient Name: [name], DING. Date of Service: 05/28/2018 10:15 AM Medical Record Number: [id number] Patient Account Number: [id number] Date of Birth/Sex: 1936-07-22 (82 y.o. F) Treating RN: Roger Shelter Primary Care Provider: Fenton Malling Other Clinician: Referring Provider: Fenton Malling Treating Provider/Extender: Melburn Hake, HOYT Weeks in Treatment: 5 Constitutional Well-nourished and well-hydrated in no acute distress. Respiratory normal breathing without difficulty. Psychiatric this patient is able to make decisions and demonstrates good insight into disease process. Alert and Oriented x 3. pleasant and cooperative. Notes Currently patient did have some discomfort with cleansing over the surface of the wound that was some [facility] noted as well as dressing dried around the edge of the wound. This was sharply debrided away today she actually tolerated this without having significant pain although she was very apprehensive during the debridement in general. Overall I am pleased with how things seem to have gone. I'm hopeful that she will continue to show signs of improvement. Electronic Signature(s) Signed: 05/28/2018 3:07:42 PM By: [name] PA-C Entered By: [name] on 05/28/2018 13:36:51 Muhl, [name] ([id number]) -------------------------------------------------------------------------------- Physician Orders Details Patient Name: [name], [name] Date of Service: 05/28/2018 10:15 AM Medical Record Number: [id number] Patient Account Number: [id number] Date of Birth/Sex: 02-15-1936 (82 y.o. F) Treating RN: Roger Shelter Primary Care Provider: Fenton Malling Other Clinician: Referring Provider: Fenton Malling Treating Provider/Extender: Melburn Hake, HOYT Weeks in Treatment: 5 Verbal / Phone Orders: No Diagnosis Coding  ICD-10 Coding Code Description S81.812S Laceration without foreign body, left lower leg, sequela S80.212S Abrasion, left knee, sequela S80.211S Abrasion, right knee, sequela L97.822 Non-pressure chronic ulcer of other part of left lower leg with fat layer exposed Wound Cleansing Wound #3 Left,Lateral Lower Leg o Clean wound with Normal Saline. Anesthetic (add to Medication List) Wound #3 Left,Lateral Lower Leg o Topical Lidocaine 4% cream applied to wound bed prior to debridement (In Clinic Only). Primary Wound Dressing Wound #3 Left,Lateral Lower Leg o Hydrafera Blue Ready Transfer Secondary Dressing Wound #3 Left,Lateral Lower Leg o ABD and Kerlix/Conform - ace wrap Dressing Change Frequency Wound #3 Left,Lateral Lower Leg o Three times weekly - [facility] Tuesday and Saturdays dressing changes. Patient comes to clinic on Thursdays. Follow-up Appointments Wound #3 Left,Lateral Lower Leg o Return Appointment in 1 week. Edema Control Wound #3 Left,Lateral Lower Leg o Elevate legs to the level of the heart and pump ankles as often as possible o Other: - ACE wrap Additional Orders / Instructions Wound #3 Left,Lateral Lower Leg o Vitamin A; Vitamin C, Zinc - Please add a multivitamin that has 100% of vitamin A, vitamin C and zinc supplements in it [name], BRADDOCK. ([id number]) o  Increase protein intake. o Activity as tolerated Home Health Wound #3 Left,Lateral Lower Leg o [place] for [place] Visits o Home Health Nurse may visit PRN to address patientos wound care needs. o FACE TO FACE ENCOUNTER: MEDICARE and MEDICAID PATIENTS: I certify that this patient is under my care and that I had a face-to-face encounter that meets the physician face-to-face encounter requirements with this patient on this date. The encounter with the patient was in whole or in part for the following MEDICAL CONDITION: (primary reason for [place]) MEDICAL NECESSITY: I certify, that based on my findings, NURSING services are a medically necessary home health service. HOME BOUND STATUS: I certify that my clinical findings support that this patient is homebound (i.e., Due to illness or injury, pt requires aid of supportive devices such as crutches, cane, wheelchairs, walkers, the use of special transportation or the assistance of another person to leave their place of residence. There is a normal inability to leave the home and doing so requires considerable and taxing effort. Other absences are for medical reasons / religious services and are infrequent or of short duration when for other reasons). o If current dressing causes regression in wound condition, may D/C ordered dressing product/s and apply Normal Saline Moist Dressing daily until next [place] / Other MD appointment. [place] of regression in wound condition at [phone]. o Please direct any NON-WOUND related issues/requests for orders to patient's Primary Care Physician Electronic Signature(s) Signed: 05/28/2018 3:07:42 PM By: [name] PA-C Signed: 05/29/2018 4:22:48 PM By: Roger Shelter Entered By: Roger Shelter on 05/28/2018 10:54:22 [name], [name]  ([id number]) -------------------------------------------------------------------------------- Problem List Details Patient Name: [name], [name]. Date of Service: 05/28/2018 10:15 AM Medical Record Number: [id number] Patient Account Number: [id number] Date of Birth/Sex: [birth year] (82 y.o. F) Treating RN: Roger Shelter Primary Care Provider: Fenton Malling Other Clinician: Referring Provider: Fenton Malling Treating Provider/Extender: Melburn Hake, HOYT Weeks in Treatment: 5 Active Problems ICD-10 Evaluated Encounter Code Description Active Date Today Diagnosis S81.812S Laceration without foreign body, left lower leg, sequela 04/23/2018 No Yes S80.212S Abrasion, left knee, sequela 04/23/2018 No Yes S80.211S Abrasion, right knee, sequela 04/23/2018 No Yes L97.822 Non-pressure chronic ulcer of other part of left lower leg  with 05/14/2018 No Yes fat layer exposed Inactive Problems Resolved Problems Electronic Signature(s) Signed: 05/28/2018 3:07:42 PM By: [name] PA-C Signed: 05/29/2018 4:22:48 PM By: Roger Shelter Entered By: Roger Shelter on 05/28/2018 10:49:36 Urieta, [name] ([id number]) -------------------------------------------------------------------------------- Progress Note Details Patient Name: [name], [name]. Date of Service: 05/28/2018 10:15 AM Medical Record Number: [id number] Patient Account Number: [id number] Date of Birth/Sex: 17-Sep-1935 (82 y.o. F) Treating RN: Roger Shelter Primary Care Provider: Fenton Malling Other Clinician: Referring Provider: Fenton Malling Treating Provider/Extender: Melburn Hake, HOYT Weeks in Treatment: 5 Subjective Chief Complaint Information obtained from Patient LLE Ulcers History of Present Illness (HPI) 04/23/18-She is seen in initial evaluation for multiple skin tears s/p fall on 8/19. She did sustain a closed fracture of the right patella secondary to that fall. She presented to emergency  department on 8/19 for treatment. The left leg laceration was unable to be sutured closed, Steri-Strips were applied and she was treated with knee immobilizer for the patella fracture. She continues on Keflex that was initiated on 8/19. The left leg skin tear flap was cleansed and reapproximated, secure with steri strips. She will follow up next week 04/30/18-She is seen in follow-up evaluation for multiple skin tears. She continues with knee immobilizers to the right lower extremity, has developed lower extremity edema with subsequent weeping; will initiate ace wrap compression. The flap is mostly devitalized, which was somewhat expected; we will change treatment plan and she will follow up next week 05/07/18-She is seen in follow-up evaluation for multiple wounds. The left lateral lower leg skin flap is nonviable/devitalized, now eschar covered. She tolerated debridement fairly well. The right lower extremity skin tear that was new last week has essentially healed. She has responded nicely to Ace wrap compression. We will modify treatment plan and she will follow-up next week 05/14/18 on evaluation today patient actually appears to be doing a little better in regard to her left lower extremity ulcers. With that being said she has been tolerating the dressing changes without complication. Medihoney is what she was switch to dear in the last week's visit. Prior to that we used Iodoflex when she had the eschar present. Nonetheless I believe that the Iodoflex may be the best thing for her at this point in regard to the current wound bed and prevention of biofilm buildup. Nonetheless she seems to be tolerating the dressing changes without complication which is good news. It's really the debridement the calls are the most pain last week she did allow for debridement this week she also agreed to allow me to attempt debridement in order to try to help this wound to heal more quickly. 05/21/18 on evaluation  today patient actually appears to be doing much better at this time in regard to her left lateral lower Trinity ulcer. She's been tolerating the dressing changes without complication. Fortunately there does not appear to be any evidence of infection at this time. There is still some Slough noted on the surface of the wound although not as much as previous. She continues to have a lot of discomfort currently. She tells me that once we put the Iodoflex on the last time it actually burned from Thursday till Sunday night and then eased off. Nonetheless it does seem to have made a great improvement in general in regard to the patient's wound bed. 05/28/18 patient's wound bed at this point actually show signs of good improvement which is excellent news currently. She has been tolerating the dressing changes without complication. She  does have a little bit of slightly hyper granular tissue at this point there's also some necrotic tissue noted on the surface of the wound. There does not appear to be any evidence of infection overall at this time. In general I feel like that she has made good progress in the past two weeks that I have been seeing her. Patient History Information obtained from Patient. Family History LACIE, LANDRY ([id number]) Cancer - Mother, Lung Disease - Siblings, No family history of Diabetes, Heart Disease, Hereditary Spherocytosis, Hypertension, Kidney Disease, Seizures, Stroke, Thyroid Problems, Tuberculosis. Social History Never smoker, Marital Status - Married, Alcohol Use - Never, Drug Use - No History, Caffeine Use - Daily. Medical And Surgical History Notes Respiratory pulmonary fibrosis, home O2 at night and PRN Gastrointestinal diverticulosis Review of Systems (ROS) Constitutional Symptoms (General Health) Denies complaints or symptoms of Fever, Chills. Respiratory The patient has no complaints or symptoms. Cardiovascular The patient has no complaints or  symptoms. Psychiatric The patient has no complaints or symptoms. Objective Constitutional Well-nourished and well-hydrated in no acute distress. Vitals Time Taken: 10:15 AM, Height: 60 in, Weight: 151 lbs, BMI: 29.5, Temperature: 98.0 F, Pulse: 52 bpm, Respiratory Rate: 16 breaths/min, Blood Pressure: 150/78 mmHg. Respiratory normal breathing without difficulty. Psychiatric this patient is able to make decisions and demonstrates good insight into disease process. Alert and Oriented x 3. pleasant and cooperative. General Notes: Currently patient did have some discomfort with cleansing over the surface of the wound that was some [facility] noted as well as dressing dried around the edge of the wound. This was sharply debrided away today she actually tolerated this without having significant pain although she was very apprehensive during the debridement in general. Overall I am pleased with how things seem to have gone. I'm hopeful that she will continue to show signs of improvement. Integumentary (Hair, Skin) Wound #3 status is Open. Original cause of wound was Trauma. The wound is located on the Left,Lateral Lower Leg. The wound measures 4.9cm length x 6.2cm width x 0.1cm depth; 23.86cm^2 area and 2.386cm^3 volume. There is Fat Layer (Subcutaneous Tissue) Exposed exposed. There is no tunneling or undermining noted. There is a small amount of serosanguineous drainage noted. The wound margin is flat and intact. There is large (67-100%) red, hyper - granulation within the wound bed. There is a small (1-33%) amount of necrotic tissue within the wound bed including Eschar and TYANN, NIEHAUS. ([id number]) Adherent Slough. The periwound skin appearance did not exhibit: Callus, Crepitus, Excoriation, Induration, Rash, Scarring, Dry/Scaly, Maceration, Atrophie Blanche, Cyanosis, Ecchymosis, Hemosiderin Staining, Mottled, Pallor, Rubor, Erythema. Periwound temperature was noted as Hot. The periwound  has tenderness on palpation. Assessment Active Problems ICD-10 Laceration without foreign body, left lower leg, sequela Abrasion, left knee, sequela Abrasion, right knee, sequela Non-pressure chronic ulcer of other part of left lower leg with fat layer exposed Procedures Wound #3 Pre-procedure diagnosis of Wound #3 is a Skin Tear located on the Left,Lateral Lower Leg . There was a Excisional Skin/Subcutaneous Tissue Debridement with a total area of 7.75 sq cm performed by STONE III, HOYT E., PA-C. With the following instrument(s): Curette to remove Viable and Non-Viable tissue/material. Material removed includes Eschar, Subcutaneous Tissue, Slough, and Biofilm after achieving pain control using Other (lidocaine 4%). No specimens were taken. A time out was conducted at 10:44, prior to the start of the procedure. A Minimum amount of bleeding was controlled with Pressure. The procedure was tolerated well with a pain level of 0 throughout  and a pain level of 0 following the procedure. Post Debridement Measurements: 4.9cm length x 6.2cm width x 0.2cm depth; 4.772cm^3 volume. Character of Wound/Ulcer Post Debridement is stable. Post procedure Diagnosis Wound #3: Same as Pre-Procedure Plan Wound Cleansing: Wound #3 Left,Lateral Lower Leg: Clean wound with Normal Saline. Anesthetic (add to Medication List): Wound #3 Left,Lateral Lower Leg: Topical Lidocaine 4% cream applied to wound bed prior to debridement (In Clinic Only). Primary Wound Dressing: Wound #3 Left,Lateral Lower Leg: Hydrafera Blue Ready Transfer Secondary Dressing: Wound #3 Left,Lateral Lower Leg: ABD and Kerlix/Conform - ace wrap Dressing Change Frequency: Wound #3 Left,Lateral Lower Leg: Three times weekly - [facility] Tuesday and Saturdays dressing changes. Patient comes to clinic on Thursdays. Follow-up Appointments: MILISA, KIMBELL ([id number]) Wound #3 Left,Lateral Lower Leg: Return Appointment in 1 week. Edema  Control: Wound #3 Left,Lateral Lower Leg: Elevate legs to the level of the heart and pump ankles as often as possible Other: - ACE wrap Additional Orders / Instructions: Wound #3 Left,Lateral Lower Leg: Vitamin A; Vitamin C, Zinc - Please add a multivitamin that has 100% of vitamin A, vitamin C and zinc supplements in it Increase protein intake. Activity as tolerated Home Health: Wound #3 Left,Lateral Lower Leg: [place] for [place] Nurse may visit PRN to address patient s wound care needs. FACE TO FACE ENCOUNTER: MEDICARE and MEDICAID PATIENTS: I certify that this patient is under my care and that I had a face-to-face encounter that meets the physician face-to-face encounter requirements with this patient on this date. The encounter with the patient was in whole or in part for the following MEDICAL CONDITION: (primary reason for [place]) MEDICAL NECESSITY: I certify, that based on my findings, NURSING services are a medically necessary home health service. HOME BOUND STATUS: I certify that my clinical findings support that this patient is homebound (i.e., Due to illness or injury, pt requires aid of supportive devices such as crutches, cane, wheelchairs, walkers, the use of special transportation or the assistance of another person to leave their place of residence. There is a normal inability to leave the home and doing so requires considerable and taxing effort. Other absences are for medical reasons / religious services and are infrequent or of short duration when for other reasons). If current dressing causes regression in wound condition, may D/C ordered dressing product/s and apply Normal Saline Moist Dressing daily until next [place] / Other MD appointment. [place] of regression in wound condition at [phone]. Please direct any NON-WOUND related issues/requests for orders to  patient's Primary Care Physician I'm gonna suggest currently that we initiate the above wound care measures for the next week. She's in agreement that plan. Will subsequently see were things stand at follow-up. If anything changes in the interim she will let us know. Please see above for specific wound care orders. We will see patient for re-evaluation in 1 week(s) here in the clinic. If anything worsens or changes patient will contact our office for additional recommendations. Electronic Signature(s) Signed: 05/28/2018 3:07:42 PM By: [name] PA-C Entered By: [name] on 05/28/2018 13:37:37 Dieujuste, [name] ([id number]) -------------------------------------------------------------------------------- ROS/PFSH Details Patient Name: ZAILYN, THOENNES. Date of Service: 05/28/2018 10:15 AM Medical Record Number: [id number] Patient Account Number: [id number] Date of Birth/Sex: 12/30/1935 (82 y.o. F) Treating RN: Roger Shelter Primary Care Provider: Fenton Malling Other Clinician: Referring Provider: Fenton Malling Treating Provider/Extender: Melburn Hake, HOYT Weeks in Treatment:  5 Information Obtained From Patient Wound History Do you currently have one or more open woundso Yes How many open wounds do you currently haveo 2 Approximately how long have you had your woundso 4 days How have you been treating your wound(s) until nowo bandage from the ER Has your wound(s) ever healed and then re-openedo No Have you had any lab work done in the past montho Yes Who ordered the lab work [facility] Have you tested positive for an antibiotic resistant organism (MRSA, VRE)o No Have you tested positive for osteomyelitis (bone infection)o No Have you had any tests for circulation on your legso No Have you had other problems associated with your woundso Swelling Constitutional Symptoms (General Health) Complaints and Symptoms: Negative for: Fever; Chills Eyes Medical  History: Negative for: Cataracts; Glaucoma; Optic Neuritis Ear/Nose/Mouth/Throat Medical History: Negative for: Chronic sinus problems/congestion; Middle ear problems Hematologic/Lymphatic Medical History: Negative for: Anemia; Hemophilia; Human Immunodeficiency Virus; Lymphedema; Sickle Cell Disease Respiratory Complaints and Symptoms: No Complaints or Symptoms Medical History: Negative for: Aspiration; Asthma; Chronic Obstructive Pulmonary Disease (COPD); Pneumothorax; Sleep Apnea; Tuberculosis Past Medical History Notes: pulmonary fibrosis, home O2 at night and PRN Cardiovascular CHANDEL, ZAUN ([id number]) Complaints and Symptoms: No Complaints or Symptoms Medical History: Positive for: Hypertension Negative for: Angina; Arrhythmia; Congestive Heart Failure; Coronary Artery Disease; Deep Vein Thrombosis; Hypotension; Myocardial Infarction; Peripheral Arterial Disease; Peripheral Venous Disease; Phlebitis; Vasculitis Gastrointestinal Medical History: Negative for: Cirrhosis ; Colitis; Crohnos; Hepatitis A; Hepatitis B; Hepatitis C Past Medical History Notes: diverticulosis Endocrine Medical History: Negative for: Type I Diabetes; Type II Diabetes Genitourinary Medical History: Negative for: End Stage Renal Disease Immunological Medical History: Negative for: Lupus Erythematosus; Raynaudos; Scleroderma Integumentary (Skin) Medical History: Negative for: History of Burn; History of pressure wounds Musculoskeletal Medical History: Positive for: Osteoarthritis Negative for: Gout; Rheumatoid Arthritis; Osteomyelitis Neurologic Medical History: Negative for: Dementia; Neuropathy; Quadriplegia; Paraplegia; Seizure Disorder Oncologic Medical History: Negative for: Received Chemotherapy; Received Radiation Psychiatric Complaints and Symptoms: No Complaints or Symptoms Medical History: Negative for: Anorexia/bulimia; Confinement Anxiety DEVORY, MCKINZIE.  ([id number]) Immunizations Pneumococcal Vaccine: Received Pneumococcal Vaccination: Yes Implantable Devices Family and Social History Cancer: Yes - Mother; Diabetes: No; Heart Disease: No; Hereditary Spherocytosis: No; Hypertension: No; Kidney Disease: No; Lung Disease: Yes - Siblings; Seizures: No; Stroke: No; Thyroid Problems: No; Tuberculosis: No; Never smoker; Marital Status - Married; Alcohol Use: Never; Drug Use: No History; Caffeine Use: Daily; Financial Concerns: No; Food, Clothing or Shelter Needs: No; Support System Lacking: No; Transportation Concerns: No; Advanced Directives: No; Patient does not want information on Advanced Directives Physician Affirmation I have reviewed and agree with the above information. Electronic Signature(s) Signed: 05/28/2018 3:07:42 PM By: [name] PA-C Signed: 05/29/2018 4:22:48 PM By: Roger Shelter Entered By: [name] on 05/28/2018 13:36:34 Helmers, [name] ([id number]) -------------------------------------------------------------------------------- SuperBill Details Patient Name: LARAH, KUNTZMAN. Date of Service: 05/28/2018 Medical Record Number: [id number] Patient Account Number: [id number] Date of Birth/Sex: [birth year] (82 y.o. F) Treating RN: Roger Shelter Primary Care Provider: Fenton Malling Other Clinician: Referring Provider: Fenton Malling Treating Provider/Extender: Melburn Hake, HOYT Weeks in Treatment: 5 Diagnosis Coding ICD-10 Codes Code Description [phone] Laceration without foreign body, left lower leg, sequela S80.212S Abrasion, left knee, sequela S80.211S Abrasion, right knee, sequela L97.822 Non-pressure chronic ulcer of other part of left lower leg with fat layer exposed Facility Procedures CPT4 Code Description: 62229798 11042 - DEB SUBQ TISSUE 20 SQ CM/< ICD-10 Diagnosis Description L97.822 Non-pressure chronic ulcer of other part of  left lower leg with Modifier: fat layer expos Quantity: 1  ed Physician Procedures CPT4 Code Description: 0220266 11042 - WC PHYS SUBQ TISS 20 SQ CM ICD-10 Diagnosis Description L97.822 Non-pressure chronic ulcer of other part of left lower leg with Modifier: fat layer expos Quantity: 1 ed Electronic Signature(s) Signed: 05/28/2018 3:07:42 PM By: [name] PA-C Entered By: [name] on 05/28/2018 13:37:50

## 2018-06-02 DIAGNOSIS — S81811D Laceration without foreign body, right lower leg, subsequent encounter: Secondary | ICD-10-CM | POA: Diagnosis not present

## 2018-06-02 DIAGNOSIS — J84112 Idiopathic pulmonary fibrosis: Secondary | ICD-10-CM | POA: Diagnosis not present

## 2018-06-02 DIAGNOSIS — S80212D Abrasion, left knee, subsequent encounter: Secondary | ICD-10-CM | POA: Diagnosis not present

## 2018-06-02 DIAGNOSIS — S81812D Laceration without foreign body, left lower leg, subsequent encounter: Secondary | ICD-10-CM | POA: Diagnosis not present

## 2018-06-02 DIAGNOSIS — S80211D Abrasion, right knee, subsequent encounter: Secondary | ICD-10-CM | POA: Diagnosis not present

## 2018-06-02 DIAGNOSIS — S82001D Unspecified fracture of right patella, subsequent encounter for closed fracture with routine healing: Secondary | ICD-10-CM | POA: Diagnosis not present

## 2018-06-04 ENCOUNTER — Encounter: Payer: Medicare Other | Attending: Physician Assistant | Admitting: Physician Assistant

## 2018-06-04 DIAGNOSIS — I10 Essential (primary) hypertension: Secondary | ICD-10-CM | POA: Insufficient documentation

## 2018-06-04 DIAGNOSIS — L97822 Non-pressure chronic ulcer of other part of left lower leg with fat layer exposed: Secondary | ICD-10-CM | POA: Diagnosis not present

## 2018-06-04 DIAGNOSIS — I2510 Atherosclerotic heart disease of native coronary artery without angina pectoris: Secondary | ICD-10-CM | POA: Insufficient documentation

## 2018-06-04 DIAGNOSIS — S82034D Nondisplaced transverse fracture of right patella, subsequent encounter for closed fracture with routine healing: Secondary | ICD-10-CM | POA: Diagnosis not present

## 2018-06-04 DIAGNOSIS — S80211A Abrasion, right knee, initial encounter: Secondary | ICD-10-CM | POA: Diagnosis not present

## 2018-06-04 DIAGNOSIS — S81812A Laceration without foreign body, left lower leg, initial encounter: Secondary | ICD-10-CM | POA: Diagnosis not present

## 2018-06-04 DIAGNOSIS — Z86718 Personal history of other venous thrombosis and embolism: Secondary | ICD-10-CM | POA: Insufficient documentation

## 2018-06-04 DIAGNOSIS — W19XXXA Unspecified fall, initial encounter: Secondary | ICD-10-CM | POA: Diagnosis not present

## 2018-06-04 DIAGNOSIS — S82001A Unspecified fracture of right patella, initial encounter for closed fracture: Secondary | ICD-10-CM | POA: Diagnosis not present

## 2018-06-04 NOTE — Telephone Encounter (Signed)
This encounter was created in error - please disregard.

## 2018-06-04 NOTE — Telephone Encounter (Signed)
Pt states she still has to much going on right now and she will CB when shes ready to schedule. Closing TE. -MM

## 2018-06-06 DIAGNOSIS — J84112 Idiopathic pulmonary fibrosis: Secondary | ICD-10-CM | POA: Diagnosis not present

## 2018-06-06 DIAGNOSIS — S82001D Unspecified fracture of right patella, subsequent encounter for closed fracture with routine healing: Secondary | ICD-10-CM | POA: Diagnosis not present

## 2018-06-06 DIAGNOSIS — S80212D Abrasion, left knee, subsequent encounter: Secondary | ICD-10-CM | POA: Diagnosis not present

## 2018-06-06 DIAGNOSIS — S81811D Laceration without foreign body, right lower leg, subsequent encounter: Secondary | ICD-10-CM | POA: Diagnosis not present

## 2018-06-06 DIAGNOSIS — S81812D Laceration without foreign body, left lower leg, subsequent encounter: Secondary | ICD-10-CM | POA: Diagnosis not present

## 2018-06-06 DIAGNOSIS — S80211D Abrasion, right knee, subsequent encounter: Secondary | ICD-10-CM | POA: Diagnosis not present

## 2018-06-06 NOTE — Progress Notes (Signed)
EDDA, OREA ([id number]) Visit Report for 06/04/2018 Arrival Information Details Patient Name: [name], [name]. Date of Service: 06/04/2018 11:00 AM Medical Record Number: [id number] Patient Account Number: [id number] Date of Birth/Sex: 1936/06/16 (82 y.o. F) Treating RN: Roger Shelter Primary Care Shammara Jarrett: Fenton Malling Other Clinician: Referring Tivon Lemoine: Fenton Malling Treating Jakin Pavao/Extender: Melburn Hake, HOYT Weeks in Treatment: 6 Visit Information History Since Last Visit Added or deleted any medications: No Patient Arrived: Ambulatory Any new allergies or adverse reactions: No Arrival Time: 11:06 Had a fall or experienced change in No Accompanied By: self activities of daily living that may affect Transfer Assistance: None risk of falls: Patient Identification Verified: Yes Signs or symptoms of abuse/neglect since last visito No Secondary Verification Process Completed: Yes Hospitalized since last visit: No Implantable device outside of the clinic excluding No cellular tissue based products placed in the center since last visit: Has Dressing in Place as Prescribed: Yes Pain Present Now: No Electronic Signature(s) Signed: 06/04/2018 4:01:29 PM By: Lorine Bears RCP, RRT, CHT Entered By: Lorine Bears on 06/04/2018 11:07:18 [name] ([id number]) -------------------------------------------------------------------------------- Clinic Level of Care Assessment Details Patient Name: [name], [name]. Date of Service: 06/04/2018 11:00 AM Medical Record Number: [id number] Patient Account Number: [id number] Date of Birth/Sex: [birth year] (82 y.o. F) Treating RN: Roger Shelter Primary Care Emerie Vanderkolk: Fenton Malling Other Clinician: Referring Jeiry Birnbaum: Fenton Malling Treating Ameia Morency/Extender: Melburn Hake, HOYT Weeks in Treatment: 6 Clinic Level of Care Assessment Items TOOL 4 Quantity Score []  - Use when  only an EandM is performed on FOLLOW-UP visit 0 ASSESSMENTS - Nursing Assessment / Reassessment X - Reassessment of Co-morbidities (includes updates in patient status) 1 10 X- 1 5 Reassessment of Adherence to Treatment Plan ASSESSMENTS - Wound and Skin Assessment / Reassessment X - Simple Wound Assessment / Reassessment - one wound 1 5 []  - 0 Complex Wound Assessment / Reassessment - multiple wounds []  - 0 Dermatologic / Skin Assessment (not related to wound area) ASSESSMENTS - Focused Assessment []  - Circumferential Edema Measurements - multi extremities 0 []  - 0 Nutritional Assessment / Counseling / Intervention []  - 0 Lower Extremity Assessment (monofilament, tuning fork, pulses) []  - 0 Peripheral Arterial Disease Assessment (using hand held doppler) ASSESSMENTS - Ostomy and/or Continence Assessment and Care []  - Incontinence Assessment and Management 0 []  - 0 Ostomy Care Assessment and Management (repouching, etc.) PROCESS - Coordination of Care X - Simple Patient / Family Education for ongoing care 1 15 []  - 0 Complex (extensive) Patient / Family Education for ongoing care []  - 0 Staff obtains Programmer, systems, Records, Test Results / Process Orders []  - 0 Staff telephones HHA, Nursing Homes / Clarify orders / etc []  - 0 Routine Transfer to another Facility (non-emergent condition) []  - 0 Routine Hospital Admission (non-emergent condition) []  - 0 New Admissions / Biomedical engineer / Ordering NPWT, Apligraf, etc. []  - 0 Emergency Hospital Admission (emergent condition) X- 1 10 Simple Discharge Coordination [name], [name] ([id number]) []  - 0 Complex (extensive) Discharge Coordination PROCESS - Special Needs []  - Pediatric / Minor Patient Management 0 []  - 0 Isolation Patient Management []  - 0 Hearing / Language / Visual special needs []  - 0 Assessment of Community assistance (transportation, D/C planning, etc.) []  - 0 Additional assistance / Altered  mentation []  - 0 Support Surface(s) Assessment (bed, cushion, seat, etc.) INTERVENTIONS - Wound Cleansing / Measurement X - Simple Wound Cleansing - one wound 1 5 []  - 0 Complex Wound Cleansing - multiple wounds  X- 1 5 Wound Imaging (photographs - any number of wounds) []  - 0 Wound Tracing (instead of photographs) X- 1 5 Simple Wound Measurement - one wound []  - 0 Complex Wound Measurement - multiple wounds INTERVENTIONS - Wound Dressings X - Small Wound Dressing one or multiple wounds 1 10 []  - 0 Medium Wound Dressing one or multiple wounds []  - 0 Large Wound Dressing one or multiple wounds []  - 0 Application of Medications - topical []  - 0 Application of Medications - injection INTERVENTIONS - Miscellaneous []  - External ear exam 0 []  - 0 Specimen Collection (cultures, biopsies, blood, body fluids, etc.) []  - 0 Specimen(s) / Culture(s) sent or taken to Lab for analysis []  - 0 Patient Transfer (multiple staff / Civil Service fast streamer / Similar devices) []  - 0 Simple Staple / Suture removal (25 or less) []  - 0 Complex Staple / Suture removal (26 or more) []  - 0 Hypo / Hyperglycemic Management (close monitor of Blood Glucose) []  - 0 Ankle / Brachial Index (ABI) - do not check if billed separately X- 1 5 Vital Signs [name], Terri J. ([id number]) Has the patient been seen at the hospital within the last three years: Yes Total Score: 75 Level Of Care: New/Established - Level 2 Electronic Signature(s) Signed: 06/04/2018 4:46:50 PM By: Roger Shelter Entered By: Roger Shelter on 06/04/2018 11:26:07 [name] ([id number]) -------------------------------------------------------------------------------- Lower Extremity Assessment Details Patient Name: [name], [name]. Date of Service: 06/04/2018 11:00 AM Medical Record Number: [id number] Patient Account Number: [id number] Date of Birth/Sex: [birth year] (82 y.o. F) Treating RN: Montey Hora Primary Care Abby Stines:  Fenton Malling Other Clinician: Referring Alondra Sahni: Fenton Malling Treating Kalani Baray/Extender: Melburn Hake, HOYT Weeks in Treatment: 6 Edema Assessment Assessed: [Left: No] [Right: No] [Left: Edema] [Right: :] Calf Left: Right: Point of Measurement: 30 cm From Medial Instep 30.3 cm cm Ankle Left: Right: Point of Measurement: 10 cm From Medial Instep 19.5 cm cm Vascular Assessment Pulses: Dorsalis Pedis Palpable: [Left:Yes] Posterior Tibial Extremity colors, hair growth, and conditions: Extremity Color: [Left:Normal] Hair Growth on Extremity: [Left:No] Temperature of Extremity: [Left:Warm] Capillary Refill: [Left:< 3 seconds] Toe Nail Assessment Left: Right: Thick: Yes Discolored: No Deformed: No Improper Length and Hygiene: No Electronic Signature(s) Signed: 06/04/2018 5:10:46 PM By: Montey Hora Entered By: Montey Hora on 06/04/2018 11:15:28 [name], [name] ([id number]) -------------------------------------------------------------------------------- Multi Wound Chart Details Patient Name: [name]. Date of Service: 06/04/2018 11:00 AM Medical Record Number: [id number] Patient Account Number: [id number] Date of Birth/Sex: 20-Dec-1935 (82 y.o. F) Treating RN: Roger Shelter Primary Care Lyfe Monger: Fenton Malling Other Clinician: Referring Mckennon Zwart: Fenton Malling Treating Kate Larock/Extender: Melburn Hake, HOYT Weeks in Treatment: 6 Vital Signs Height(in): 60 Pulse(bpm): 66 Weight(lbs): 151 Blood Pressure(mmHg): 148/72 Body Mass Index(BMI): 29 Temperature(F): 98.1 Respiratory Rate 16 (breaths/min): Photos: [3:No Photos] [N/A:N/A] Wound Location: [3:Left Lower Leg - Lateral] [N/A:N/A] Wounding Event: [3:Trauma] [N/A:N/A] Primary Etiology: [3:Skin Tear] [N/A:N/A] Comorbid History: [3:Hypertension, Osteoarthritis] [N/A:N/A] Date Acquired: [3:04/20/2018] [N/A:N/A] Weeks of Treatment: [3:6] [N/A:N/A] Wound Status: [3:Open]  [N/A:N/A] Measurements L x W x D [3:4.2x5x0.1] [N/A:N/A] (cm) Area (cm) : [3:16.493] [N/A:N/A] Volume (cm) : [3:1.649] [N/A:N/A] % Reduction in Area: [3:85.30%] [N/A:N/A] % Reduction in Volume: [3:92.60%] [N/A:N/A] Classification: [3:Full Thickness Without Exposed Support Structures] [N/A:N/A] Exudate Amount: [3:Small] [N/A:N/A] Exudate Type: [3:Serosanguineous] [N/A:N/A] Exudate Color: [3:red, brown] [N/A:N/A] Wound Margin: [3:Flat and Intact] [N/A:N/A] Granulation Amount: [3:Large (67-100%)] [N/A:N/A] Granulation Quality: [3:Red, Hyper-granulation] [N/A:N/A] Necrotic Amount: [3:Small (1-33%)] [N/A:N/A] Necrotic Tissue: [3:Eschar, Adherent Slough] [N/A:N/A] Exposed Structures: [3:Fat Layer (Subcutaneous Tissue)  EDDA, OREA ([id number]) Visit Report for 06/04/2018 Arrival Information Details Patient Name: [name], [name]. Date of Service: 06/04/2018 11:00 AM Medical Record Number: [id number] Patient Account Number: [id number] Date of Birth/Sex: 1936/06/16 (82 y.o. F) Treating RN: Roger Shelter Primary Care Shammara Jarrett: Fenton Malling Other Clinician: Referring Tivon Lemoine: Fenton Malling Treating Jakin Pavao/Extender: Melburn Hake, HOYT Weeks in Treatment: 6 Visit Information History Since Last Visit Added or deleted any medications: No Patient Arrived: Ambulatory Any new allergies or adverse reactions: No Arrival Time: 11:06 Had a fall or experienced change in No Accompanied By: self activities of daily living that may affect Transfer Assistance: None risk of falls: Patient Identification Verified: Yes Signs or symptoms of abuse/neglect since last visito No Secondary Verification Process Completed: Yes Hospitalized since last visit: No Implantable device outside of the clinic excluding No cellular tissue based products placed in the center since last visit: Has Dressing in Place as Prescribed: Yes Pain Present Now: No Electronic Signature(s) Signed: 06/04/2018 4:01:29 PM By: Lorine Bears RCP, RRT, CHT Entered By: Lorine Bears on 06/04/2018 11:07:18 [name] ([id number]) -------------------------------------------------------------------------------- Clinic Level of Care Assessment Details Patient Name: [name], [name]. Date of Service: 06/04/2018 11:00 AM Medical Record Number: [id number] Patient Account Number: [id number] Date of Birth/Sex: [birth year] (82 y.o. F) Treating RN: Roger Shelter Primary Care Emerie Vanderkolk: Fenton Malling Other Clinician: Referring Jeiry Birnbaum: Fenton Malling Treating Ameia Morency/Extender: Melburn Hake, HOYT Weeks in Treatment: 6 Clinic Level of Care Assessment Items TOOL 4 Quantity Score []  - Use when  only an EandM is performed on FOLLOW-UP visit 0 ASSESSMENTS - Nursing Assessment / Reassessment X - Reassessment of Co-morbidities (includes updates in patient status) 1 10 X- 1 5 Reassessment of Adherence to Treatment Plan ASSESSMENTS - Wound and Skin Assessment / Reassessment X - Simple Wound Assessment / Reassessment - one wound 1 5 []  - 0 Complex Wound Assessment / Reassessment - multiple wounds []  - 0 Dermatologic / Skin Assessment (not related to wound area) ASSESSMENTS - Focused Assessment []  - Circumferential Edema Measurements - multi extremities 0 []  - 0 Nutritional Assessment / Counseling / Intervention []  - 0 Lower Extremity Assessment (monofilament, tuning fork, pulses) []  - 0 Peripheral Arterial Disease Assessment (using hand held doppler) ASSESSMENTS - Ostomy and/or Continence Assessment and Care []  - Incontinence Assessment and Management 0 []  - 0 Ostomy Care Assessment and Management (repouching, etc.) PROCESS - Coordination of Care X - Simple Patient / Family Education for ongoing care 1 15 []  - 0 Complex (extensive) Patient / Family Education for ongoing care []  - 0 Staff obtains Programmer, systems, Records, Test Results / Process Orders []  - 0 Staff telephones HHA, Nursing Homes / Clarify orders / etc []  - 0 Routine Transfer to another Facility (non-emergent condition) []  - 0 Routine Hospital Admission (non-emergent condition) []  - 0 New Admissions / Biomedical engineer / Ordering NPWT, Apligraf, etc. []  - 0 Emergency Hospital Admission (emergent condition) X- 1 10 Simple Discharge Coordination [name], [name] ([id number]) []  - 0 Complex (extensive) Discharge Coordination PROCESS - Special Needs []  - Pediatric / Minor Patient Management 0 []  - 0 Isolation Patient Management []  - 0 Hearing / Language / Visual special needs []  - 0 Assessment of Community assistance (transportation, D/C planning, etc.) []  - 0 Additional assistance / Altered  mentation []  - 0 Support Surface(s) Assessment (bed, cushion, seat, etc.) INTERVENTIONS - Wound Cleansing / Measurement X - Simple Wound Cleansing - one wound 1 5 []  - 0 Complex Wound Cleansing - multiple wounds  EDDA, OREA ([id number]) Visit Report for 06/04/2018 Arrival Information Details Patient Name: [name], [name]. Date of Service: 06/04/2018 11:00 AM Medical Record Number: [id number] Patient Account Number: [id number] Date of Birth/Sex: 1936/06/16 (82 y.o. F) Treating RN: Roger Shelter Primary Care Shammara Jarrett: Fenton Malling Other Clinician: Referring Tivon Lemoine: Fenton Malling Treating Jakin Pavao/Extender: Melburn Hake, HOYT Weeks in Treatment: 6 Visit Information History Since Last Visit Added or deleted any medications: No Patient Arrived: Ambulatory Any new allergies or adverse reactions: No Arrival Time: 11:06 Had a fall or experienced change in No Accompanied By: self activities of daily living that may affect Transfer Assistance: None risk of falls: Patient Identification Verified: Yes Signs or symptoms of abuse/neglect since last visito No Secondary Verification Process Completed: Yes Hospitalized since last visit: No Implantable device outside of the clinic excluding No cellular tissue based products placed in the center since last visit: Has Dressing in Place as Prescribed: Yes Pain Present Now: No Electronic Signature(s) Signed: 06/04/2018 4:01:29 PM By: Lorine Bears RCP, RRT, CHT Entered By: Lorine Bears on 06/04/2018 11:07:18 [name] ([id number]) -------------------------------------------------------------------------------- Clinic Level of Care Assessment Details Patient Name: [name], [name]. Date of Service: 06/04/2018 11:00 AM Medical Record Number: [id number] Patient Account Number: [id number] Date of Birth/Sex: [birth year] (82 y.o. F) Treating RN: Roger Shelter Primary Care Emerie Vanderkolk: Fenton Malling Other Clinician: Referring Jeiry Birnbaum: Fenton Malling Treating Ameia Morency/Extender: Melburn Hake, HOYT Weeks in Treatment: 6 Clinic Level of Care Assessment Items TOOL 4 Quantity Score []  - Use when  only an EandM is performed on FOLLOW-UP visit 0 ASSESSMENTS - Nursing Assessment / Reassessment X - Reassessment of Co-morbidities (includes updates in patient status) 1 10 X- 1 5 Reassessment of Adherence to Treatment Plan ASSESSMENTS - Wound and Skin Assessment / Reassessment X - Simple Wound Assessment / Reassessment - one wound 1 5 []  - 0 Complex Wound Assessment / Reassessment - multiple wounds []  - 0 Dermatologic / Skin Assessment (not related to wound area) ASSESSMENTS - Focused Assessment []  - Circumferential Edema Measurements - multi extremities 0 []  - 0 Nutritional Assessment / Counseling / Intervention []  - 0 Lower Extremity Assessment (monofilament, tuning fork, pulses) []  - 0 Peripheral Arterial Disease Assessment (using hand held doppler) ASSESSMENTS - Ostomy and/or Continence Assessment and Care []  - Incontinence Assessment and Management 0 []  - 0 Ostomy Care Assessment and Management (repouching, etc.) PROCESS - Coordination of Care X - Simple Patient / Family Education for ongoing care 1 15 []  - 0 Complex (extensive) Patient / Family Education for ongoing care []  - 0 Staff obtains Programmer, systems, Records, Test Results / Process Orders []  - 0 Staff telephones HHA, Nursing Homes / Clarify orders / etc []  - 0 Routine Transfer to another Facility (non-emergent condition) []  - 0 Routine Hospital Admission (non-emergent condition) []  - 0 New Admissions / Biomedical engineer / Ordering NPWT, Apligraf, etc. []  - 0 Emergency Hospital Admission (emergent condition) X- 1 10 Simple Discharge Coordination [name], [name] ([id number]) []  - 0 Complex (extensive) Discharge Coordination PROCESS - Special Needs []  - Pediatric / Minor Patient Management 0 []  - 0 Isolation Patient Management []  - 0 Hearing / Language / Visual special needs []  - 0 Assessment of Community assistance (transportation, D/C planning, etc.) []  - 0 Additional assistance / Altered  mentation []  - 0 Support Surface(s) Assessment (bed, cushion, seat, etc.) INTERVENTIONS - Wound Cleansing / Measurement X - Simple Wound Cleansing - one wound 1 5 []  - 0 Complex Wound Cleansing - multiple wounds

## 2018-06-06 NOTE — Progress Notes (Signed)
Terri, [name] ([id number]) Visit Report for 06/04/2018 Chief Complaint Document Details Patient Name: [name], [name]. Date of Service: 06/04/2018 11:00 AM Medical Record Number: [id number] Patient Account Number: [id number] Date of Birth/Sex: July 01, 1936 (82 y.o. F) Treating RN: Roger Shelter Primary Care Provider: Fenton Malling Other Clinician: Referring Provider: Fenton Malling Treating Provider/Extender: Melburn Hake, HOYT Weeks in Treatment: 6 Information Obtained from: Patient Chief Complaint LLE Ulcers Electronic Signature(s) Signed: 06/04/2018 4:36:35 PM By: [name] PA-C Entered By: [name] on 06/04/2018 11:18:07 GABBRIELLA, PRESSWOOD ([id number]) -------------------------------------------------------------------------------- HPI Details Patient Name: Terri, [name] Date of Service: 06/04/2018 11:00 AM Medical Record Number: [id number] Patient Account Number: [id number] Date of Birth/Sex: [birth year] (82 y.o. F) Treating RN: Roger Shelter Primary Care Provider: Fenton Malling Other Clinician: Referring Provider: Fenton Malling Treating Provider/Extender: Melburn Hake, HOYT Weeks in Treatment: 6 History of Present Illness HPI Description: 04/23/18-She is seen in initial evaluation for multiple skin tears s/p fall on 8/19. She did sustain a closed fracture of the right patella secondary to that fall. She presented to emergency department on 8/19 for treatment. The left leg laceration was unable to be sutured closed, Steri-Strips were applied and she was treated with knee immobilizer for the patella fracture. She continues on Keflex that was initiated on 8/19. The left leg skin tear flap was cleansed and reapproximated, secure with steri strips. She will follow up next week 04/30/18-She is seen in follow-up evaluation for multiple skin tears. She continues with knee immobilizers to the right lower extremity, has developed lower extremity edema  with subsequent weeping; will initiate ace wrap compression. The flap is mostly devitalized, which was somewhat expected; we will change treatment plan and she will follow up next week 05/07/18-She is seen in follow-up evaluation for multiple wounds. The left lateral lower leg skin flap is nonviable/devitalized, now eschar covered. She tolerated debridement fairly well. The right lower extremity skin tear that was new last week has essentially healed. She has responded nicely to Ace wrap compression. We will modify treatment plan and she will follow-up next week 05/14/18 on evaluation today patient actually appears to be doing a little better in regard to her left lower extremity ulcers. With that being said she has been tolerating the dressing changes without complication. Medihoney is what she was switch to dear in the last week's visit. Prior to that we used Iodoflex when she had the eschar present. Nonetheless I believe that the Iodoflex may be the best thing for her at this point in regard to the current wound bed and prevention of biofilm buildup. Nonetheless she seems to be tolerating the dressing changes without complication which is good news. It's really the debridement the calls are the most pain last week she did allow for debridement this week she also agreed to allow me to attempt debridement in order to try to help this wound to heal more quickly. 05/21/18 on evaluation today patient actually appears to be doing much better at this time in regard to her left lateral lower Trinity ulcer. She's been tolerating the dressing changes without complication. Fortunately there does not appear to be any evidence of infection at this time. There is still some Slough noted on the surface of the wound although not as much as previous. She continues to have a lot of discomfort currently. She tells me that once we put the Iodoflex on the last time it actually burned from Thursday till Sunday night and  then eased off. Nonetheless it does  seem to have made a great improvement in general in regard to the patient's wound bed. 05/28/18 patient's wound bed at this point actually show signs of good improvement which is excellent news currently. She has been tolerating the dressing changes without complication. She does have a little bit of slightly hyper granular tissue at this point there's also some necrotic tissue noted on the surface of the wound. There does not appear to be any evidence of infection overall at this time. In general I feel like that she has made good progress in the past two weeks that I have been seeing her. 06/04/18 on evaluation today patient's left lower Trinity ulcer actually appears to be doing very well today. She has great improvement with excellent epithelialization at this time. Overall I feel like she has made great progress even since last time I seen her. There's really no necrotic tissue on the surface of the wound which is also great news her pain appears to be better. Electronic Signature(s) Signed: 06/04/2018 4:36:35 PM By: [name] PA-C Entered By: [name] on 06/04/2018 11:29:18 TYRAH, BROERS ([id number]) -------------------------------------------------------------------------------- Physical Exam Details Patient Name: Terri, [name]. Date of Service: 06/04/2018 11:00 AM Medical Record Number: [id number] Patient Account Number: [id number] Date of Birth/Sex: 08-02-1936 (82 y.o. F) Treating RN: Roger Shelter Primary Care Provider: Fenton Malling Other Clinician: Referring Provider: Fenton Malling Treating Provider/Extender: Melburn Hake, HOYT Weeks in Treatment: 6 Constitutional Well-nourished and well-hydrated in no acute distress. Respiratory normal breathing without difficulty. clear to auscultation bilaterally. Cardiovascular regular rate and rhythm with normal S1, S2. Psychiatric this patient is able to make decisions  and demonstrates good insight into disease process. Alert and Oriented x 3. pleasant and cooperative. Notes On evaluation today patient's wound bed actually appears to be showing signs of improvement which is excellent news. She's been tolerating the dressing's without complication. Home health is coming out and utilizing the [facility] Dressing well which is great news. Overall I'm very happy with the progress. No sharp debridement was required today. Electronic Signature(s) Signed: 06/04/2018 4:36:35 PM By: [name] PA-C Entered By: [name] on 06/04/2018 11:29:49 KEI, LANGHORST ([id number]) -------------------------------------------------------------------------------- Physician Orders Details Patient Name: CHARLEAN, CARNEAL Date of Service: 06/04/2018 11:00 AM Medical Record Number: [id number] Patient Account Number: [id number] Date of Birth/Sex: [birth year] (82 y.o. F) Treating RN: Roger Shelter Primary Care Provider: Fenton Malling Other Clinician: Referring Provider: Fenton Malling Treating Provider/Extender: Melburn Hake, HOYT Weeks in Treatment: 6 Verbal / Phone Orders: No Diagnosis Coding ICD-10 Coding Code Description [phone] Laceration without foreign body, left lower leg, sequela S80.212S Abrasion, left knee, sequela S80.211S Abrasion, right knee, sequela L97.822 Non-pressure chronic ulcer of other part of left lower leg with fat layer exposed Wound Cleansing Wound #3 Left,Lateral Lower Leg o Clean wound with Normal Saline. Anesthetic (add to Medication List) Wound #3 Left,Lateral Lower Leg o Topical Lidocaine 4% cream applied to wound bed prior to debridement (In Clinic Only). Primary Wound Dressing Wound #3 Left,Lateral Lower Leg o Hydrafera Blue Ready Transfer Secondary Dressing Wound #3 Left,Lateral Lower Leg o ABD and Kerlix/Conform - ace wrap Dressing Change Frequency Wound #3 Left,Lateral Lower Leg o Three times  weekly - [facility] Tuesday and Saturdays dressing changes. Patient comes to clinic on Thursdays. Follow-up Appointments Wound #3 Left,Lateral Lower Leg o Return Appointment in 2 weeks. Edema Control Wound #3 Left,Lateral Lower Leg o Elevate legs to the level of the heart and pump ankles as often as  possible o Other: - ACE wrap Additional Orders / Instructions Wound #3 Left,Lateral Lower Leg o Vitamin A; Vitamin C, Zinc - Please add a multivitamin that has 100% of vitamin A, vitamin C and zinc supplements in it HARLEA, GOETZINGER. ([id number]) o Increase protein intake. o Activity as tolerated Home Health Wound #3 Left,Lateral Lower Leg o [place] for [place] Visits o Home Health Nurse may visit PRN to address patientos wound care needs. o FACE TO FACE ENCOUNTER: MEDICARE and MEDICAID PATIENTS: I certify that this patient is under my care and that I had a face-to-face encounter that meets the physician face-to-face encounter requirements with this patient on this date. The encounter with the patient was in whole or in part for the following MEDICAL CONDITION: (primary reason for [place]) MEDICAL NECESSITY: I certify, that based on my findings, NURSING services are a medically necessary home health service. HOME BOUND STATUS: I certify that my clinical findings support that this patient is homebound (i.e., Due to illness or injury, pt requires aid of supportive devices such as crutches, cane, wheelchairs, walkers, the use of special transportation or the assistance of another person to leave their place of residence. There is a normal inability to leave the home and doing so requires considerable and taxing effort. Other absences are for medical reasons / religious services and are infrequent or of short duration when for other reasons). o If current dressing causes regression in wound condition, may D/C ordered dressing  product/s and apply Normal Saline Moist Dressing daily until next [place] / Other MD appointment. [place] of regression in wound condition at [phone]. o Please direct any NON-WOUND related issues/requests for orders to patient's Primary Care Physician Electronic Signature(s) Signed: 06/04/2018 4:36:35 PM By: [name] PA-C Signed: 06/04/2018 4:46:50 PM By: Roger Shelter Entered By: Roger Shelter on 06/04/2018 11:25:32 KATREENA, SCHUPP ([id number]) -------------------------------------------------------------------------------- Problem List Details Patient Name: JILLEEN, ESSNER. Date of Service: 06/04/2018 11:00 AM Medical Record Number: [id number] Patient Account Number: [id number] Date of Birth/Sex: 12/29/1935 (82 y.o. F) Treating RN: Roger Shelter Primary Care Provider: Fenton Malling Other Clinician: Referring Provider: Fenton Malling Treating Provider/Extender: Melburn Hake, HOYT Weeks in Treatment: 6 Active Problems ICD-10 Evaluated Encounter Code Description Active Date Today Diagnosis S81.812S Laceration without foreign body, left lower leg, sequela 04/23/2018 No Yes S80.212S Abrasion, left knee, sequela 04/23/2018 No Yes S80.211S Abrasion, right knee, sequela 04/23/2018 No Yes L97.822 Non-pressure chronic ulcer of other part of left lower leg with 05/14/2018 No Yes fat layer exposed Inactive Problems Resolved Problems Electronic Signature(s) Signed: 06/04/2018 4:36:35 PM By: [name] PA-C Entered By: [name] on 06/04/2018 11:17:57 Nomura, [name] ([id number]) -------------------------------------------------------------------------------- Progress Note Details Patient Name: [name]. Date of Service: 06/04/2018 11:00 AM Medical Record Number: [id number] Patient Account Number: [id number] Date of Birth/Sex: August 04, 1936 (82 y.o. F) Treating RN: Roger Shelter Primary Care Provider:  Fenton Malling Other Clinician: Referring Provider: Fenton Malling Treating Provider/Extender: Melburn Hake, HOYT Weeks in Treatment: 6 Subjective Chief Complaint Information obtained from Patient LLE Ulcers History of Present Illness (HPI) 04/23/18-She is seen in initial evaluation for multiple skin tears s/p fall on 8/19. She did sustain a closed fracture of the right patella secondary to that fall. She presented to emergency department on 8/19 for treatment. The left leg laceration was unable to be sutured closed, Steri-Strips were applied and she was treated with knee immobilizer for the patella fracture.  She continues on Keflex that was initiated on 8/19. The left leg skin tear flap was cleansed and reapproximated, secure with steri strips. She will follow up next week 04/30/18-She is seen in follow-up evaluation for multiple skin tears. She continues with knee immobilizers to the right lower extremity, has developed lower extremity edema with subsequent weeping; will initiate ace wrap compression. The flap is mostly devitalized, which was somewhat expected; we will change treatment plan and she will follow up next week 05/07/18-She is seen in follow-up evaluation for multiple wounds. The left lateral lower leg skin flap is nonviable/devitalized, now eschar covered. She tolerated debridement fairly well. The right lower extremity skin tear that was new last week has essentially healed. She has responded nicely to Ace wrap compression. We will modify treatment plan and she will follow-up next week 05/14/18 on evaluation today patient actually appears to be doing a little better in regard to her left lower extremity ulcers. With that being said she has been tolerating the dressing changes without complication. Medihoney is what she was switch to dear in the last week's visit. Prior to that we used Iodoflex when she had the eschar present. Nonetheless I believe that the Iodoflex may be the  best thing for her at this point in regard to the current wound bed and prevention of biofilm buildup. Nonetheless she seems to be tolerating the dressing changes without complication which is good news. It's really the debridement the calls are the most pain last week she did allow for debridement this week she also agreed to allow me to attempt debridement in order to try to help this wound to heal more quickly. 05/21/18 on evaluation today patient actually appears to be doing much better at this time in regard to her left lateral lower Trinity ulcer. She's been tolerating the dressing changes without complication. Fortunately there does not appear to be any evidence of infection at this time. There is still some Slough noted on the surface of the wound although not as much as previous. She continues to have a lot of discomfort currently. She tells me that once we put the Iodoflex on the last time it actually burned from Thursday till Sunday night and then eased off. Nonetheless it does seem to have made a great improvement in general in regard to the patient's wound bed. 05/28/18 patient's wound bed at this point actually show signs of good improvement which is excellent news currently. She has been tolerating the dressing changes without complication. She does have a little bit of slightly hyper granular tissue at this point there's also some necrotic tissue noted on the surface of the wound. There does not appear to be any evidence of infection overall at this time. In general I feel like that she has made good progress in the past two weeks that I have been seeing her. 06/04/18 on evaluation today patient's left lower Trinity ulcer actually appears to be doing very well today. She has great improvement with excellent epithelialization at this time. Overall I feel like she has made great progress even since last time I seen her. There's really no necrotic tissue on the surface of the wound which  is also great news her pain appears to be better. BRISIA, SCHUERMANN ([id number]) Patient History Information obtained from Patient. Family History Cancer - Mother, Lung Disease - Siblings, No family history of Diabetes, Heart Disease, Hereditary Spherocytosis, Hypertension, Kidney Disease, Seizures, Stroke, Thyroid Problems, Tuberculosis. Social History Never smoker, Marital Status -  Married, Alcohol Use - Never, Drug Use - No History, Caffeine Use - Daily. Medical And Surgical History Notes Respiratory pulmonary fibrosis, home O2 at night and PRN Gastrointestinal diverticulosis Review of Systems (ROS) Constitutional Symptoms (General Health) Denies complaints or symptoms of Fever, Chills. Respiratory The patient has no complaints or symptoms. Cardiovascular The patient has no complaints or symptoms. Psychiatric The patient has no complaints or symptoms. Objective Constitutional Well-nourished and well-hydrated in no acute distress. Vitals Time Taken: 11:05 AM, Height: 60 in, Weight: 151 lbs, BMI: 29.5, Temperature: 98.1 F, Pulse: 63 bpm, Respiratory Rate: 16 breaths/min, Blood Pressure: 148/72 mmHg. Respiratory normal breathing without difficulty. clear to auscultation bilaterally. Cardiovascular regular rate and rhythm with normal S1, S2. Psychiatric this patient is able to make decisions and demonstrates good insight into disease process. Alert and Oriented x 3. pleasant and cooperative. General Notes: On evaluation today patient's wound bed actually appears to be showing signs of improvement which is excellent news. She's been tolerating the dressing's without complication. Home health is coming out and utilizing the [facility] Dressing well which is great news. Overall I'm very happy with the progress. No sharp debridement was required today. ROXANNA, MCEVER ([id number]) Integumentary (Hair, Skin) Wound #3 status is Open. Original cause of wound was Trauma.  The wound is located on the Left,Lateral Lower Leg. The wound measures 4.2cm length x 5cm width x 0.1cm depth; 16.493cm^2 area and 1.649cm^3 volume. There is Fat Layer (Subcutaneous Tissue) Exposed exposed. There is no tunneling or undermining noted. There is a small amount of serosanguineous drainage noted. The wound margin is flat and intact. There is large (67-100%) red, hyper - granulation within the wound bed. There is a small (1-33%) amount of necrotic tissue within the wound bed including Eschar and Adherent Slough. The periwound skin appearance did not exhibit: Callus, Crepitus, Excoriation, Induration, Rash, Scarring, Dry/Scaly, Maceration, Atrophie Blanche, Cyanosis, Ecchymosis, Hemosiderin Staining, Mottled, Pallor, Rubor, Erythema. Periwound temperature was noted as No Abnormality. The periwound has tenderness on palpation. Assessment Active Problems ICD-10 Laceration without foreign body, left lower leg, sequela Abrasion, left knee, sequela Abrasion, right knee, sequela Non-pressure chronic ulcer of other part of left lower leg with fat layer exposed Plan Wound Cleansing: Wound #3 Left,Lateral Lower Leg: Clean wound with Normal Saline. Anesthetic (add to Medication List): Wound #3 Left,Lateral Lower Leg: Topical Lidocaine 4% cream applied to wound bed prior to debridement (In Clinic Only). Primary Wound Dressing: Wound #3 Left,Lateral Lower Leg: Hydrafera Blue Ready Transfer Secondary Dressing: Wound #3 Left,Lateral Lower Leg: ABD and Kerlix/Conform - ace wrap Dressing Change Frequency: Wound #3 Left,Lateral Lower Leg: Three times weekly - [facility] Tuesday and Saturdays dressing changes. Patient comes to clinic on Thursdays. Follow-up Appointments: Wound #3 Left,Lateral Lower Leg: Return Appointment in 2 weeks. Edema Control: Wound #3 Left,Lateral Lower Leg: Elevate legs to the level of the heart and pump ankles as often as possible Other: - ACE wrap Additional Orders /  Instructions: Wound #3 Left,Lateral Lower Leg: Vitamin A; Vitamin C, Zinc - Please add a multivitamin that has 100% of vitamin A, vitamin C and zinc supplements in it Increase protein intake. Activity as tolerated EILIANA, DRONE ([id number]) Home Health: Wound #3 Left,Lateral Lower Leg: [place] for [place] Nurse may visit PRN to address patient s wound care needs. FACE TO FACE ENCOUNTER: MEDICARE and MEDICAID PATIENTS: I certify that this patient is under my care and that I had a face-to-face encounter  that meets the physician face-to-face encounter requirements with this patient on this date. The encounter with the patient was in whole or in part for the following MEDICAL CONDITION: (primary reason for [place]) MEDICAL NECESSITY: I certify, that based on my findings, NURSING services are a medically necessary home health service. HOME BOUND STATUS: I certify that my clinical findings support that this patient is homebound (i.e., Due to illness or injury, pt requires aid of supportive devices such as crutches, cane, wheelchairs, walkers, the use of special transportation or the assistance of another person to leave their place of residence. There is a normal inability to leave the home and doing so requires considerable and taxing effort. Other absences are for medical reasons / religious services and are infrequent or of short duration when for other reasons). If current dressing causes regression in wound condition, may D/C ordered dressing product/s and apply Normal Saline Moist Dressing daily until next [place] / Other MD appointment. [place] of regression in wound condition at [phone]. Please direct any NON-WOUND related issues/requests for orders to patient's Primary Care Physician At this point my suggestion is gonna be that we continue with the above wound care measures for  the next week. The patient is in agreement the plan. If anything changes or worsens in the interim she will let me know. Otherwise will see her back for follow-up. Please see above for specific wound care orders. We will see patient for re-evaluation in 2 week(s) here in the clinic. If anything worsens or changes patient will contact our office for additional recommendations. Electronic Signature(s) Signed: 06/04/2018 4:36:35 PM By: [name] PA-C Entered By: [name] on 06/04/2018 11:30:09 AAISHA, SLITER ([id number]) -------------------------------------------------------------------------------- ROS/PFSH Details Patient Name: EVALYNN, HANKINS. Date of Service: 06/04/2018 11:00 AM Medical Record Number: [id number] Patient Account Number: [id number] Date of Birth/Sex: 1936/05/09 (82 y.o. F) Treating RN: Roger Shelter Primary Care Provider: Fenton Malling Other Clinician: Referring Provider: Fenton Malling Treating Provider/Extender: Melburn Hake, HOYT Weeks in Treatment: 6 Information Obtained From Patient Wound History Do you currently have one or more open woundso Yes How many open wounds do you currently haveo 2 Approximately how long have you had your woundso 4 days How have you been treating your wound(s) until nowo bandage from the ER Has your wound(s) ever healed and then re-openedo No Have you had any lab work done in the past montho Yes Who ordered the lab work [place] [facility] Have you tested positive for an antibiotic resistant organism (MRSA, VRE)o No Have you tested positive for osteomyelitis (bone infection)o No Have you had any tests for circulation on your legso No Have you had other problems associated with your woundso Swelling Constitutional Symptoms (General Health) Complaints and Symptoms: Negative for: Fever; Chills Eyes Medical History: Negative for: Cataracts; Glaucoma; Optic Neuritis Ear/Nose/Mouth/Throat Medical History: Negative  for: Chronic sinus problems/congestion; Middle ear problems Hematologic/Lymphatic Medical History: Negative for: Anemia; Hemophilia; Human Immunodeficiency Virus; Lymphedema; Sickle Cell Disease Respiratory Complaints and Symptoms: No Complaints or Symptoms Medical History: Negative for: Aspiration; Asthma; Chronic Obstructive Pulmonary Disease (COPD); Pneumothorax; Sleep Apnea; Tuberculosis Past Medical History Notes: pulmonary fibrosis, home O2 at night and PRN Cardiovascular DEVANSHI, CALIFF ([id number]) Complaints and Symptoms: No Complaints or Symptoms Medical History: Positive for: Hypertension Negative for: Angina; Arrhythmia; Congestive Heart Failure; Coronary Artery Disease; Deep Vein Thrombosis; Hypotension; Myocardial Infarction; Peripheral Arterial Disease; Peripheral Venous Disease; Phlebitis; Vasculitis Gastrointestinal Medical History: Negative for: Cirrhosis ; Colitis;  Crohnos; Hepatitis A; Hepatitis B; Hepatitis C Past Medical History Notes: diverticulosis Endocrine Medical History: Negative for: Type I Diabetes; Type II Diabetes Genitourinary Medical History: Negative for: End Stage Renal Disease Immunological Medical History: Negative for: Lupus Erythematosus; Raynaudos; Scleroderma Integumentary (Skin) Medical History: Negative for: History of Burn; History of pressure wounds Musculoskeletal Medical History: Positive for: Osteoarthritis Negative for: Gout; Rheumatoid Arthritis; Osteomyelitis Neurologic Medical History: Negative for: Dementia; Neuropathy; Quadriplegia; Paraplegia; Seizure Disorder Oncologic Medical History: Negative for: Received Chemotherapy; Received Radiation Psychiatric Complaints and Symptoms: No Complaints or Symptoms Medical History: Negative for: Anorexia/bulimia; Confinement Anxiety TEAIRA, CROFT. ([id number]) Immunizations Pneumococcal Vaccine: Received Pneumococcal Vaccination: Yes Implantable Devices Family  and Social History Cancer: Yes - Mother; Diabetes: No; Heart Disease: No; Hereditary Spherocytosis: No; Hypertension: No; Kidney Disease: No; Lung Disease: Yes - Siblings; Seizures: No; Stroke: No; Thyroid Problems: No; Tuberculosis: No; Never smoker; Marital Status - Married; Alcohol Use: Never; Drug Use: No History; Caffeine Use: Daily; Financial Concerns: No; Food, Clothing or Shelter Needs: No; Support System Lacking: No; Transportation Concerns: No; Advanced Directives: No; Patient does not want information on Advanced Directives Physician Affirmation I have reviewed and agree with the above information. Electronic Signature(s) Signed: 06/04/2018 4:36:35 PM By: [name] PA-C Signed: 06/04/2018 4:46:50 PM By: Roger Shelter Entered By: [name] on 06/04/2018 11:29:35 TANE, BIEGLER ([id number]) -------------------------------------------------------------------------------- SuperBill Details Patient Name: BELISA, EICHHOLZ. Date of Service: 06/04/2018 Medical Record Number: [id number] Patient Account Number: [id number] Date of Birth/Sex: 1935-11-29 (82 y.o. F) Treating RN: Roger Shelter Primary Care Provider: Fenton Malling Other Clinician: Referring Provider: Fenton Malling Treating Provider/Extender: Melburn Hake, HOYT Weeks in Treatment: 6 Diagnosis Coding ICD-10 Codes Code Description [phone] Laceration without foreign body, left lower leg, sequela S80.212S Abrasion, left knee, sequela S80.211S Abrasion, right knee, sequela L97.822 Non-pressure chronic ulcer of other part of left lower leg with fat layer exposed Facility Procedures CPT4 Code: [id number] Description: 18288 - WOUND CARE VISIT-LEV 2 EST PT Modifier: Quantity: 1 Physician Procedures CPT4 Code Description: [id number] 46047 - WC PHYS LEVEL 3 - EST PT ICD-10 Diagnosis Description S81.812S Laceration without foreign body, left lower leg, sequela S80.212S Abrasion, left knee, sequela S80.211S  Abrasion, right knee, sequela L97.822  Non-pressure chronic ulcer of other part of left lower leg wit Modifier: h fat layer expos Quantity: 1 ed Electronic Signature(s) Signed: 06/04/2018 4:36:35 PM By: [name] PA-C Entered By: [name] on 06/04/2018 11:30:33

## 2018-06-09 DIAGNOSIS — S81811D Laceration without foreign body, right lower leg, subsequent encounter: Secondary | ICD-10-CM | POA: Diagnosis not present

## 2018-06-09 DIAGNOSIS — S81812D Laceration without foreign body, left lower leg, subsequent encounter: Secondary | ICD-10-CM | POA: Diagnosis not present

## 2018-06-09 DIAGNOSIS — S80211D Abrasion, right knee, subsequent encounter: Secondary | ICD-10-CM | POA: Diagnosis not present

## 2018-06-09 DIAGNOSIS — S80212D Abrasion, left knee, subsequent encounter: Secondary | ICD-10-CM | POA: Diagnosis not present

## 2018-06-09 DIAGNOSIS — S82001D Unspecified fracture of right patella, subsequent encounter for closed fracture with routine healing: Secondary | ICD-10-CM | POA: Diagnosis not present

## 2018-06-09 DIAGNOSIS — J84112 Idiopathic pulmonary fibrosis: Secondary | ICD-10-CM | POA: Diagnosis not present

## 2018-06-11 DIAGNOSIS — S81812D Laceration without foreign body, left lower leg, subsequent encounter: Secondary | ICD-10-CM | POA: Diagnosis not present

## 2018-06-11 DIAGNOSIS — S81811D Laceration without foreign body, right lower leg, subsequent encounter: Secondary | ICD-10-CM | POA: Diagnosis not present

## 2018-06-11 DIAGNOSIS — S80211D Abrasion, right knee, subsequent encounter: Secondary | ICD-10-CM | POA: Diagnosis not present

## 2018-06-11 DIAGNOSIS — S82001D Unspecified fracture of right patella, subsequent encounter for closed fracture with routine healing: Secondary | ICD-10-CM | POA: Diagnosis not present

## 2018-06-11 DIAGNOSIS — S80212D Abrasion, left knee, subsequent encounter: Secondary | ICD-10-CM | POA: Diagnosis not present

## 2018-06-11 DIAGNOSIS — J84112 Idiopathic pulmonary fibrosis: Secondary | ICD-10-CM | POA: Diagnosis not present

## 2018-06-13 DIAGNOSIS — S81812D Laceration without foreign body, left lower leg, subsequent encounter: Secondary | ICD-10-CM | POA: Diagnosis not present

## 2018-06-13 DIAGNOSIS — S82001D Unspecified fracture of right patella, subsequent encounter for closed fracture with routine healing: Secondary | ICD-10-CM | POA: Diagnosis not present

## 2018-06-13 DIAGNOSIS — J84112 Idiopathic pulmonary fibrosis: Secondary | ICD-10-CM | POA: Diagnosis not present

## 2018-06-13 DIAGNOSIS — S81811D Laceration without foreign body, right lower leg, subsequent encounter: Secondary | ICD-10-CM | POA: Diagnosis not present

## 2018-06-13 DIAGNOSIS — S80211D Abrasion, right knee, subsequent encounter: Secondary | ICD-10-CM | POA: Diagnosis not present

## 2018-06-13 DIAGNOSIS — S80212D Abrasion, left knee, subsequent encounter: Secondary | ICD-10-CM | POA: Diagnosis not present

## 2018-06-16 ENCOUNTER — Encounter: Payer: Self-pay | Admitting: Obstetrics and Gynecology

## 2018-06-16 ENCOUNTER — Ambulatory Visit (INDEPENDENT_AMBULATORY_CARE_PROVIDER_SITE_OTHER): Payer: Medicare Other | Admitting: Obstetrics and Gynecology

## 2018-06-16 ENCOUNTER — Other Ambulatory Visit: Payer: Self-pay

## 2018-06-16 VITALS — BP 129/73 | HR 76 | Ht 60.0 in | Wt 153.1 lb

## 2018-06-16 DIAGNOSIS — Z4689 Encounter for fitting and adjustment of other specified devices: Secondary | ICD-10-CM | POA: Diagnosis not present

## 2018-06-16 DIAGNOSIS — N8110 Cystocele, unspecified: Secondary | ICD-10-CM | POA: Diagnosis not present

## 2018-06-16 DIAGNOSIS — S80211D Abrasion, right knee, subsequent encounter: Secondary | ICD-10-CM | POA: Diagnosis not present

## 2018-06-16 DIAGNOSIS — N952 Postmenopausal atrophic vaginitis: Secondary | ICD-10-CM

## 2018-06-16 DIAGNOSIS — I6523 Occlusion and stenosis of bilateral carotid arteries: Secondary | ICD-10-CM

## 2018-06-16 DIAGNOSIS — S81812D Laceration without foreign body, left lower leg, subsequent encounter: Secondary | ICD-10-CM | POA: Diagnosis not present

## 2018-06-16 DIAGNOSIS — S82001D Unspecified fracture of right patella, subsequent encounter for closed fracture with routine healing: Secondary | ICD-10-CM | POA: Diagnosis not present

## 2018-06-16 DIAGNOSIS — S81811D Laceration without foreign body, right lower leg, subsequent encounter: Secondary | ICD-10-CM | POA: Diagnosis not present

## 2018-06-16 DIAGNOSIS — J84112 Idiopathic pulmonary fibrosis: Secondary | ICD-10-CM | POA: Diagnosis not present

## 2018-06-16 DIAGNOSIS — S80212D Abrasion, left knee, subsequent encounter: Secondary | ICD-10-CM | POA: Diagnosis not present

## 2018-06-16 MED ORDER — ESTRADIOL 0.1 MG/GM VA CREA: 1.0000 | TOPICAL_CREAM | VAGINAL | 2 refills | Status: DC

## 2018-06-16 NOTE — Progress Notes (Signed)
    GYNECOLOGY PROGRESS NOTE  Subjective:    Patient ID: [name], female    DOB: 1935/11/20, 82 y.o.   MRN: [id number]  HPI  Patient is a 82 y.o. G2P2 female who presents for pessary check. She denies major complaints today. Denies itching or burning.  She denies pelvic discomfort and difficulty urinating or moving her bowels.    Of note, patient with recent fall, causing wound of left leg ( being followed by wound care center), and fracture of right knee. Notes she has not been using her estrogen cream lately due to difficulty with application  ? The following portions of the patient's history were reviewed and updated as appropriate: allergies, current medications, past family history, past medical history, past social history, past surgical history and problem list.  Review of Systems Pertinent items noted in HPI and remainder of comprehensive ROS otherwise negative.   Objective:   Blood pressure 129/73, pulse 76, height 5' (1.524 m), weight 153 lb 1.6 oz (69.4 kg).    General appearance: alert and no distress  Abdomen: soft, non-tender. No masses palpable or organomegaly. No suprapubic tenderness.  Pelvis: The patient's Size 3 ring with support pessary was removed, and cleaned. Speculum examination revealed mildly atrophic vaginal mucosa and urethra with no lesions. Scant thin white-yellow discharge also present.    Assessment:   Pessary in situ Cystocele Vaginal atrophy   Plan:   - Continue Premarin cream for vaginal atrophy, once weekly.Continue 0.5 mg dosing. Advised that she can use the applicator to aid in appication. Can discontinue Trimo-San gel at this time.  - To f/u in 10-12 weeks for pessary check.     Rubie Maid, MD [facility]

## 2018-06-16 NOTE — Progress Notes (Signed)
Pt is present today for a pessary check. Pt stated that she think the pessary is helping, but is concerned if the pessary is still in place after her fall.

## 2018-06-17 DIAGNOSIS — S82001A Unspecified fracture of right patella, initial encounter for closed fracture: Secondary | ICD-10-CM | POA: Diagnosis not present

## 2018-06-17 DIAGNOSIS — M5416 Radiculopathy, lumbar region: Secondary | ICD-10-CM | POA: Diagnosis not present

## 2018-06-18 ENCOUNTER — Encounter

## 2018-06-18 ENCOUNTER — Ambulatory Visit (INDEPENDENT_AMBULATORY_CARE_PROVIDER_SITE_OTHER): Payer: Medicare Other

## 2018-06-18 ENCOUNTER — Encounter: Payer: Medicare Other | Admitting: Physician Assistant

## 2018-06-18 DIAGNOSIS — I10 Essential (primary) hypertension: Secondary | ICD-10-CM | POA: Diagnosis not present

## 2018-06-18 DIAGNOSIS — Z86718 Personal history of other venous thrombosis and embolism: Secondary | ICD-10-CM | POA: Diagnosis not present

## 2018-06-18 DIAGNOSIS — Z23 Encounter for immunization: Secondary | ICD-10-CM | POA: Diagnosis not present

## 2018-06-18 DIAGNOSIS — S22009D Unspecified fracture of unspecified thoracic vertebra, subsequent encounter for fracture with routine healing: Secondary | ICD-10-CM | POA: Diagnosis not present

## 2018-06-18 DIAGNOSIS — S80211A Abrasion, right knee, initial encounter: Secondary | ICD-10-CM | POA: Diagnosis not present

## 2018-06-18 DIAGNOSIS — L97822 Non-pressure chronic ulcer of other part of left lower leg with fat layer exposed: Secondary | ICD-10-CM | POA: Diagnosis not present

## 2018-06-18 DIAGNOSIS — S81812A Laceration without foreign body, left lower leg, initial encounter: Secondary | ICD-10-CM | POA: Diagnosis not present

## 2018-06-18 DIAGNOSIS — I2510 Atherosclerotic heart disease of native coronary artery without angina pectoris: Secondary | ICD-10-CM | POA: Diagnosis not present

## 2018-06-20 DIAGNOSIS — S81811D Laceration without foreign body, right lower leg, subsequent encounter: Secondary | ICD-10-CM | POA: Diagnosis not present

## 2018-06-20 DIAGNOSIS — S80211D Abrasion, right knee, subsequent encounter: Secondary | ICD-10-CM | POA: Diagnosis not present

## 2018-06-20 DIAGNOSIS — S82001D Unspecified fracture of right patella, subsequent encounter for closed fracture with routine healing: Secondary | ICD-10-CM | POA: Diagnosis not present

## 2018-06-20 DIAGNOSIS — S80212D Abrasion, left knee, subsequent encounter: Secondary | ICD-10-CM | POA: Diagnosis not present

## 2018-06-20 DIAGNOSIS — S81812D Laceration without foreign body, left lower leg, subsequent encounter: Secondary | ICD-10-CM | POA: Diagnosis not present

## 2018-06-20 DIAGNOSIS — J84112 Idiopathic pulmonary fibrosis: Secondary | ICD-10-CM | POA: Diagnosis not present

## 2018-06-20 NOTE — Progress Notes (Signed)
Di Kindle Entered By: Lorine Bears on 06/18/2018 10:26:08 [name] ([id number]) -------------------------------------------------------------------------------- Patient/Caregiver Education Details Patient Name: [name], [name] Date of Service: 06/18/2018 10:15 AM Medical Record Number: [id number] Patient Account Number: [id number] Date of Birth/Gender: [birth year] (82 y.o. F) Treating RN: Montey Hora Primary Care Physician: Fenton Malling Other Clinician: Referring Physician: Fenton Malling Treating Physician/Extender:  Sharalyn Ink in Treatment: 8 Education Assessment Education Provided To: Patient Education Topics Provided Wound/Skin Impairment: Handouts: Other: wound care as ordered Methods: Demonstration, Explain/Verbal Responses: State content correctly Electronic Signature(s) Signed: 06/18/2018 5:25:24 PM By: Montey Hora Entered By: Montey Hora on 06/18/2018 10:54:34 Keitt, [name] ([id number]) -------------------------------------------------------------------------------- Wound Assessment Details Patient Name: Terri, [name]. Date of Service: 06/18/2018 10:15 AM Medical Record Number: [id number] Patient Account Number: [id number] Date of Birth/Sex: [birth year] (82 y.o. F) Treating RN: Secundino Ginger Primary Care Anslee Micheletti: Fenton Malling Other Clinician: Referring Nateisha Moyd: Fenton Malling Treating Nerissa Constantin/Extender: Melburn Hake, HOYT Weeks in Treatment: 8 Wound Status Wound Number: 3 Primary Etiology: Skin Tear Wound Location: Left Lower Leg - Lateral Wound Status: Open Wounding Event: Trauma Comorbid History: Hypertension, Osteoarthritis Date Acquired: 04/20/2018 Weeks Of Treatment: 8 Clustered Wound: No Photos Photo Uploaded By: Secundino Ginger on 06/18/2018 10:41:11 Wound Measurements Length: (cm) 3.5 Width: (cm) 4.5 Depth: (cm) 0.1 Area: (cm) 12.37 Volume: (cm) 1.237 % Reduction in Area: 88.9% % Reduction in Volume: 94.5% Epithelialization: Small (1-33%) Tunneling: No Undermining: No Wound Description Full Thickness Without Exposed Support Foul Odo Classification: Structures Slough/F Wound Margin: Flat and Intact Exudate Small Amount: Exudate Type: Serosanguineous Exudate Color: red, brown r After Cleansing: No ibrino Yes Wound Bed Granulation Amount: Large (67-100%) Exposed Structure Granulation Quality: Red, Hyper-granulation Fascia Exposed: No Necrotic Amount: Small (1-33%) Fat Layer (Subcutaneous Tissue) Exposed: Yes Necrotic  Quality: Eschar, Adherent Slough Tendon Exposed: No Muscle Exposed: No Joint Exposed: No Bone Exposed: No Eckerman, [place] J. ([id number]) Periwound Skin Texture Texture Color No Abnormalities Noted: No No Abnormalities Noted: No Callus: No Atrophie Blanche: No Crepitus: No Cyanosis: No Excoriation: No Ecchymosis: No Induration: No Erythema: No Rash: No Hemosiderin Staining: No Scarring: No Mottled: No Pallor: No Moisture Rubor: No No Abnormalities Noted: No Dry / Scaly: No Temperature / Pain Maceration: No Temperature: No Abnormality Tenderness on Palpation: Yes Wound Preparation Ulcer Cleansing: Rinsed/Irrigated with Saline Topical Anesthetic Applied: Other: lidocaine 4%, Treatment Notes Wound #3 (Left, Lateral Lower Leg) 1. Cleansed with: Clean wound with Normal Saline 2. Anesthetic Topical Lidocaine 4% cream to wound bed prior to debridement 4. Dressing Applied: Hydrafera Blue 5. Secondary Dressing Applied ABD Pad Kerlix/Conform 7. Secured with Tape Other (specify in notes) Notes Hydrafera Blue, abd, conform secured with ace wrap Electronic Signature(s) Signed: 06/18/2018 11:57:55 AM By: Secundino Ginger Entered By: Secundino Ginger on 06/18/2018 10:35:55 Weightman, [name] ([id number]) -------------------------------------------------------------------------------- Vitals Details Patient Name: Terri, [name]. Date of Service: 06/18/2018 10:15 AM Medical Record Number: [id number] Patient Account Number: [id number] Date of Birth/Sex: Jul 21, 1936 (82 y.o. F) Treating RN: Montey Hora Primary Care Gian Ybarra: Fenton Malling Other Clinician: Referring Temima Kutsch: Fenton Malling Treating Fabrizio Filip/Extender: Melburn Hake, HOYT Weeks in Treatment: 8 Vital Signs Time Taken: 10:25 Temperature (F): 97.6 Height (in): 60 Pulse (bpm): 55 Weight (lbs): 151 Respiratory Rate (breaths/min): 16 Body Mass Index (BMI): 29.5 Blood Pressure (mmHg): 136/53 Reference  Range: 80 - 120 mg / dl Electronic Signature(s) Signed: 06/18/2018 2:32:34 PM By: Lorine Bears RCP, RRT, CHT Entered By: Lorine Bears on 06/18/2018 10:29:06  N/A Date Acquired: 04/20/2018 N/A N/A Weeks of Treatment: 8 N/A N/A Wound Status: Open N/A N/A Measurements L x W x D 3.5x4.5x0.1 N/A N/A (cm) Area (cm) : 12.37 N/A N/A Volume (cm) : 1.237 N/A N/A % Reduction in Area: 88.90% N/A N/A % Reduction in Volume: 94.50% N/A N/A Classification: Full Thickness Without N/A N/A Exposed Support Structures Exudate Amount: Small N/A N/A Exudate Type: Serosanguineous N/A N/A Exudate Color: red, brown N/A N/A Wound Margin: Flat and Intact N/A N/A Granulation Amount: Large (67-100%) N/A N/A Granulation Quality: Red, Hyper-granulation N/A N/A Necrotic Amount: Small (1-33%) N/A N/A Necrotic Tissue: Eschar, Adherent Slough N/A N/A Exposed Structures: Fat Layer (Subcutaneous N/A N/A Tissue) Exposed: Yes Fascia: No Tendon: No Muscle: No [name], [name] ([id number]) Joint: No Bone: No Epithelialization: Small (1-33%) N/A N/A Periwound Skin Texture: Excoriation: No N/A N/A Induration: No Callus: No Crepitus: No Rash: No Scarring: No Periwound Skin Moisture: Maceration: No N/A N/A Dry/Scaly: No Periwound Skin Color: Atrophie Blanche: No N/A N/A Cyanosis: No Ecchymosis: No Erythema: No Hemosiderin Staining: No Mottled: No Pallor: No Rubor:  No Temperature: No Abnormality N/A N/A Tenderness on Palpation: Yes N/A N/A Wound Preparation: Ulcer Cleansing: N/A N/A Rinsed/Irrigated with Saline Topical Anesthetic Applied: Other: lidocaine 4% Treatment Notes Electronic Signature(s) Signed: 06/18/2018 5:25:24 PM By: Montey Hora Entered By: Montey Hora on 06/18/2018 10:51:51 RAMSIE, OSTRANDER ([id number]) -------------------------------------------------------------------------------- Garden Details Patient Name: ABRAHAM, MARGULIES. Date of Service: 06/18/2018 10:15 AM Medical Record Number: [id number] Patient Account Number: [id number] Date of Birth/Sex: [birth year] (82 y.o. F) Treating RN: Montey Hora Primary Care Aneudy Champlain: Fenton Malling Other Clinician: Referring Klayten Jolliff: Fenton Malling Treating Velisa Regnier/Extender: Melburn Hake, HOYT Weeks in Treatment: 8 Active Inactive ` Abuse / Safety / Falls / Self Care Management Nursing Diagnoses: Impaired physical mobility Goals: Patient will remain injury free related to falls Date Initiated: 04/23/2018 Target Resolution Date: 07/03/2018 Goal Status: Active Interventions: Assess fall risk on admission and as needed Notes: ` Orientation to the Wound Care Program Nursing Diagnoses: Knowledge deficit related to the wound healing center program Goals: Patient/caregiver will verbalize understanding of the [place] Program Date Initiated: 04/23/2018 Target Resolution Date: 07/03/2018 Goal Status: Active Interventions: Provide education on orientation to the wound center Notes: ` Pain, Acute or Chronic Nursing Diagnoses: Pain, acute or chronic: actual or potential Goals: Patient/caregiver will verbalize adequate pain control between visits Date Initiated: 04/23/2018 Target Resolution Date: 07/03/2018 Goal Status: Active Interventions: ZYRA, PARRILLO ([id number]) Assess comfort goal upon admission Notes: ` Wound/Skin  Impairment Nursing Diagnoses: Impaired tissue integrity Goals: Ulcer/skin breakdown will heal within 14 weeks Date Initiated: 04/23/2018 Target Resolution Date: 07/03/2018 Goal Status: Active Interventions: Assess patient/caregiver ability to obtain necessary supplies Assess patient/caregiver ability to perform ulcer/skin care regimen upon admission and as needed Assess ulceration(s) every visit Notes: Electronic Signature(s) Signed: 06/18/2018 5:25:24 PM By: Montey Hora Entered By: Montey Hora on 06/18/2018 10:51:44 Giorgio, [name] ([id number]) -------------------------------------------------------------------------------- Pain Assessment Details Patient Name: MERIE, WULF. Date of Service: 06/18/2018 10:15 AM Medical Record Number: [id number] Patient Account Number: [id number] Date of Birth/Sex: 03-06-1936 (82 y.o. F) Treating RN: Montey Hora Primary Care Tiziana Cislo: Fenton Malling Other Clinician: Referring Nickson Middlesworth: Fenton Malling Treating Tylor Gambrill/Extender: Melburn Hake, HOYT Weeks in Treatment: 8 Active Problems Location of Pain Severity and Description of Pain Patient Has Paino No Site Locations Pain Management and Medication Current Pain Management: Electronic Signature(s) Signed: 06/18/2018 2:32:34 PM By: Lorine Bears RCP, RRT, CHT Signed: 06/18/2018 5:25:24 PM By: Marjory Lies,  IVAN, MASKELL ([id number]) Visit Report for 06/18/2018 Arrival Information Details Patient Name: MARDEL, GRUDZIEN. Date of Service: 06/18/2018 10:15 AM Medical Record Number: [id number] Patient Account Number: [id number] Date of Birth/Sex: Nov 26, 1935 (82 y.o. F) Treating RN: Montey Hora Primary Care Revonda Menter: Fenton Malling Other Clinician: Referring Audri Kozub: Fenton Malling Treating Jonnette Nuon/Extender: Melburn Hake, HOYT Weeks in Treatment: 8 Visit Information History Since Last Visit Added or deleted any medications: No Patient Arrived: Cane Any new allergies or adverse reactions: No Arrival Time: 10:22 Had a fall or experienced change in No Accompanied By: self activities of daily living that may affect Transfer Assistance: None risk of falls: Patient Identification Verified: Yes Signs or symptoms of abuse/neglect since last visito No Secondary Verification Process Completed: Yes Hospitalized since last visit: No Implantable device outside of the clinic excluding No cellular tissue based products placed in the center since last visit: Has Dressing in Place as Prescribed: Yes Pain Present Now: No Electronic Signature(s) Signed: 06/18/2018 2:32:34 PM By: Lorine Bears RCP, RRT, CHT Entered By: Lorine Bears on 06/18/2018 10:26:00 [name] ([id number]) -------------------------------------------------------------------------------- Clinic Level of Care Assessment Details Patient Name: ALISHBA, NAPLES. Date of Service: 06/18/2018 10:15 AM Medical Record Number: [id number] Patient Account Number: [id number] Date of Birth/Sex: 1936/07/10 (82 y.o. F) Treating RN: Montey Hora Primary Care Adarryl Goldammer: Fenton Malling Other Clinician: Referring Frederick Klinger: Fenton Malling Treating Zakeya Junker/Extender: Melburn Hake, HOYT Weeks in Treatment: 8 Clinic Level of Care Assessment Items TOOL 4 Quantity Score []  - Use when only an  EandM is performed on FOLLOW-UP visit 0 ASSESSMENTS - Nursing Assessment / Reassessment X - Reassessment of Co-morbidities (includes updates in patient status) 1 10 X- 1 5 Reassessment of Adherence to Treatment Plan ASSESSMENTS - Wound and Skin Assessment / Reassessment X - Simple Wound Assessment / Reassessment - one wound 1 5 []  - 0 Complex Wound Assessment / Reassessment - multiple wounds []  - 0 Dermatologic / Skin Assessment (not related to wound area) ASSESSMENTS - Focused Assessment X - Circumferential Edema Measurements - multi extremities 1 5 []  - 0 Nutritional Assessment / Counseling / Intervention X- 1 5 Lower Extremity Assessment (monofilament, tuning fork, pulses) []  - 0 Peripheral Arterial Disease Assessment (using hand held doppler) ASSESSMENTS - Ostomy and/or Continence Assessment and Care []  - Incontinence Assessment and Management 0 []  - 0 Ostomy Care Assessment and Management (repouching, etc.) PROCESS - Coordination of Care X - Simple Patient / Family Education for ongoing care 1 15 []  - 0 Complex (extensive) Patient / Family Education for ongoing care []  - 0 Staff obtains Programmer, systems, Records, Test Results / Process Orders []  - 0 Staff telephones HHA, Nursing Homes / Clarify orders / etc []  - 0 Routine Transfer to another Facility (non-emergent condition) []  - 0 Routine Hospital Admission (non-emergent condition) []  - 0 New Admissions / Biomedical engineer / Ordering NPWT, Apligraf, etc. []  - 0 Emergency Hospital Admission (emergent condition) X- 1 10 Simple Discharge Coordination LURENA, NAEVE. ([id number]) []  - 0 Complex (extensive) Discharge Coordination PROCESS - Special Needs []  - Pediatric / Minor Patient Management 0 []  - 0 Isolation Patient Management []  - 0 Hearing / Language / Visual special needs []  - 0 Assessment of Community assistance (transportation, D/C planning, etc.) []  - 0 Additional assistance / Altered mentation []  -  0 Support Surface(s) Assessment (bed, cushion, seat, etc.) INTERVENTIONS - Wound Cleansing / Measurement X - Simple Wound Cleansing - one wound 1 5 []  - 0 Complex Wound Cleansing - multiple  IVAN, MASKELL ([id number]) Visit Report for 06/18/2018 Arrival Information Details Patient Name: MARDEL, GRUDZIEN. Date of Service: 06/18/2018 10:15 AM Medical Record Number: [id number] Patient Account Number: [id number] Date of Birth/Sex: Nov 26, 1935 (82 y.o. F) Treating RN: Montey Hora Primary Care Revonda Menter: Fenton Malling Other Clinician: Referring Audri Kozub: Fenton Malling Treating Jonnette Nuon/Extender: Melburn Hake, HOYT Weeks in Treatment: 8 Visit Information History Since Last Visit Added or deleted any medications: No Patient Arrived: Cane Any new allergies or adverse reactions: No Arrival Time: 10:22 Had a fall or experienced change in No Accompanied By: self activities of daily living that may affect Transfer Assistance: None risk of falls: Patient Identification Verified: Yes Signs or symptoms of abuse/neglect since last visito No Secondary Verification Process Completed: Yes Hospitalized since last visit: No Implantable device outside of the clinic excluding No cellular tissue based products placed in the center since last visit: Has Dressing in Place as Prescribed: Yes Pain Present Now: No Electronic Signature(s) Signed: 06/18/2018 2:32:34 PM By: Lorine Bears RCP, RRT, CHT Entered By: Lorine Bears on 06/18/2018 10:26:00 [name] ([id number]) -------------------------------------------------------------------------------- Clinic Level of Care Assessment Details Patient Name: ALISHBA, NAPLES. Date of Service: 06/18/2018 10:15 AM Medical Record Number: [id number] Patient Account Number: [id number] Date of Birth/Sex: 1936/07/10 (82 y.o. F) Treating RN: Montey Hora Primary Care Adarryl Goldammer: Fenton Malling Other Clinician: Referring Frederick Klinger: Fenton Malling Treating Zakeya Junker/Extender: Melburn Hake, HOYT Weeks in Treatment: 8 Clinic Level of Care Assessment Items TOOL 4 Quantity Score []  - Use when only an  EandM is performed on FOLLOW-UP visit 0 ASSESSMENTS - Nursing Assessment / Reassessment X - Reassessment of Co-morbidities (includes updates in patient status) 1 10 X- 1 5 Reassessment of Adherence to Treatment Plan ASSESSMENTS - Wound and Skin Assessment / Reassessment X - Simple Wound Assessment / Reassessment - one wound 1 5 []  - 0 Complex Wound Assessment / Reassessment - multiple wounds []  - 0 Dermatologic / Skin Assessment (not related to wound area) ASSESSMENTS - Focused Assessment X - Circumferential Edema Measurements - multi extremities 1 5 []  - 0 Nutritional Assessment / Counseling / Intervention X- 1 5 Lower Extremity Assessment (monofilament, tuning fork, pulses) []  - 0 Peripheral Arterial Disease Assessment (using hand held doppler) ASSESSMENTS - Ostomy and/or Continence Assessment and Care []  - Incontinence Assessment and Management 0 []  - 0 Ostomy Care Assessment and Management (repouching, etc.) PROCESS - Coordination of Care X - Simple Patient / Family Education for ongoing care 1 15 []  - 0 Complex (extensive) Patient / Family Education for ongoing care []  - 0 Staff obtains Programmer, systems, Records, Test Results / Process Orders []  - 0 Staff telephones HHA, Nursing Homes / Clarify orders / etc []  - 0 Routine Transfer to another Facility (non-emergent condition) []  - 0 Routine Hospital Admission (non-emergent condition) []  - 0 New Admissions / Biomedical engineer / Ordering NPWT, Apligraf, etc. []  - 0 Emergency Hospital Admission (emergent condition) X- 1 10 Simple Discharge Coordination LURENA, NAEVE. ([id number]) []  - 0 Complex (extensive) Discharge Coordination PROCESS - Special Needs []  - Pediatric / Minor Patient Management 0 []  - 0 Isolation Patient Management []  - 0 Hearing / Language / Visual special needs []  - 0 Assessment of Community assistance (transportation, D/C planning, etc.) []  - 0 Additional assistance / Altered mentation []  -  0 Support Surface(s) Assessment (bed, cushion, seat, etc.) INTERVENTIONS - Wound Cleansing / Measurement X - Simple Wound Cleansing - one wound 1 5 []  - 0 Complex Wound Cleansing - multiple

## 2018-06-20 NOTE — Progress Notes (Signed)
[name] ([id number]) Visit Report for 06/18/2018 Chief Complaint Document Details Patient Name: [name], [name]. Date of Service: 06/18/2018 10:15 AM Medical Record Number: [id number] Patient Account Number: [id number] Date of Birth/Sex: 11-06-1935 (82 y.o. F) Treating RN: Montey Hora Primary Care Provider: Fenton Malling Other Clinician: Referring Provider: Fenton Malling Treating Provider/Extender: Melburn Hake, HOYT Weeks in Treatment: 8 Information Obtained from: Patient Chief Complaint LLE Ulcers Electronic Signature(s) Signed: 06/18/2018 5:08:41 PM By: [name] PA-C Entered By: [name] on 06/18/2018 10:25:22 [name], [name] ([id number]) -------------------------------------------------------------------------------- HPI Details Patient Name: [name], [name] Date of Service: 06/18/2018 10:15 AM Medical Record Number: [id number] Patient Account Number: [id number] Date of Birth/Sex: 11-Nov-1935 (82 y.o. F) Treating RN: Montey Hora Primary Care Provider: Fenton Malling Other Clinician: Referring Provider: Fenton Malling Treating Provider/Extender: Melburn Hake, HOYT Weeks in Treatment: 8 History of Present Illness HPI Description: 04/23/18-She is seen in initial evaluation for multiple skin tears s/p fall on 8/19. She did sustain a closed fracture of the right patella secondary to that fall. She presented to emergency department on 8/19 for treatment. The left leg laceration was unable to be sutured closed, Steri-Strips were applied and she was treated with knee immobilizer for the patella fracture. She continues on Keflex that was initiated on 8/19. The left leg skin tear flap was cleansed and reapproximated, secure with steri strips. She will follow up next week 04/30/18-She is seen in follow-up evaluation for multiple skin tears. She continues with knee immobilizers to the right lower extremity, has developed lower extremity edema  with subsequent weeping; will initiate ace wrap compression. The flap is mostly devitalized, which was somewhat expected; we will change treatment plan and she will follow up next week 05/07/18-She is seen in follow-up evaluation for multiple wounds. The left lateral lower leg skin flap is nonviable/devitalized, now eschar covered. She tolerated debridement fairly well. The right lower extremity skin tear that was new last week has essentially healed. She has responded nicely to Ace wrap compression. We will modify treatment plan and she will follow-up next week 05/14/18 on evaluation today patient actually appears to be doing a little better in regard to her left lower extremity ulcers. With that being said she has been tolerating the dressing changes without complication. Medihoney is what she was switch to dear in the last week's visit. Prior to that we used Iodoflex when she had the eschar present. Nonetheless I believe that the Iodoflex may be the best thing for her at this point in regard to the current wound bed and prevention of biofilm buildup. Nonetheless she seems to be tolerating the dressing changes without complication which is good news. It's really the debridement the calls are the most pain last week she did allow for debridement this week she also agreed to allow me to attempt debridement in order to try to help this wound to heal more quickly. 05/21/18 on evaluation today patient actually appears to be doing much better at this time in regard to her left lateral lower Trinity ulcer. She's been tolerating the dressing changes without complication. Fortunately there does not appear to be any evidence of infection at this time. There is still some Slough noted on the surface of the wound although not as much as previous. She continues to have a lot of discomfort currently. She tells me that once we put the Iodoflex on the last time it actually burned from Thursday till Sunday night and  then eased off. Nonetheless it does  seem to have made a great improvement in general in regard to the patient's wound bed. 05/28/18 patient's wound bed at this point actually show signs of good improvement which is excellent news currently. She has been tolerating the dressing changes without complication. She does have a little bit of slightly hyper granular tissue at this point there's also some necrotic tissue noted on the surface of the wound. There does not appear to be any evidence of infection overall at this time. In general I feel like that she has made good progress in the past two weeks that I have been seeing her. 06/04/18 on evaluation today patient's left lower Trinity ulcer actually appears to be doing very well today. She has great improvement with excellent epithelialization at this time. Overall I feel like she has made great progress even since last time I seen her. There's really no necrotic tissue on the surface of the wound which is also great news her pain appears to be better. 06/18/18 on evaluation today the patient continues to show signs of improvement in regard to her lower extremity ulcer. She's been tolerating the Hydrofera Blue Dressing without complication and the wound bed seems to be doing excellent at this time. Overall I'm very pleased with the progress and were things stand. Electronic Signature(s) Signed: 06/18/2018 5:08:41 PM By: Orpah Melter, [place] ([id number]) Entered By: [name] on 06/18/2018 11:02:49 [name], [name] ([id number]) -------------------------------------------------------------------------------- Physical Exam Details Patient Name: [name]. Date of Service: 06/18/2018 10:15 AM Medical Record Number: [id number] Patient Account Number: [id number] Date of Birth/Sex: 03/19/1936 (82 y.o. F) Treating RN: Montey Hora Primary Care Provider: Fenton Malling Other Clinician: Referring Provider:  Fenton Malling Treating Provider/Extender: Melburn Hake, HOYT Weeks in Treatment: 8 Constitutional Well-nourished and well-hydrated in no acute distress. Respiratory normal breathing without difficulty. clear to auscultation bilaterally. Cardiovascular regular rate and rhythm with normal S1, S2. Psychiatric this patient is able to make decisions and demonstrates good insight into disease process. Alert and Oriented x 3. pleasant and cooperative. Notes Patient's wound bed currently shows evidence of good granulation there does not appear to be any evidence of infection at this time which is great news. Overall I'm very pleased with how things seem to be progressing. Electronic Signature(s) Signed: 06/18/2018 5:08:41 PM By: [name] PA-C Entered By: [name] on 06/18/2018 11:03:24 [name], [name] ([id number]) -------------------------------------------------------------------------------- Physician Orders Details Patient Name: GELENA, KLOSINSKI. Date of Service: 06/18/2018 10:15 AM Medical Record Number: [id number] Patient Account Number: [id number] Date of Birth/Sex: 1935-12-10 (82 y.o. F) Treating RN: Montey Hora Primary Care Provider: Fenton Malling Other Clinician: Referring Provider: Fenton Malling Treating Provider/Extender: Melburn Hake, HOYT Weeks in Treatment: 8 Verbal / Phone Orders: No Diagnosis Coding ICD-10 Coding Code Description [phone] Laceration without foreign body, left lower leg, sequela S80.212S Abrasion, left knee, sequela S80.211S Abrasion, right knee, sequela L97.822 Non-pressure chronic ulcer of other part of left lower leg with fat layer exposed Wound Cleansing Wound #3 Left,Lateral Lower Leg o Clean wound with Normal Saline. Anesthetic (add to Medication List) Wound #3 Left,Lateral Lower Leg o Topical Lidocaine 4% cream applied to wound bed prior to debridement (In Clinic Only). Primary Wound Dressing Wound #3  Left,Lateral Lower Leg o Hydrafera Blue Ready Transfer Secondary Dressing Wound #3 Left,Lateral Lower Leg o ABD and Kerlix/Conform - ace wrap Dressing Change Frequency Wound #3 Left,Lateral Lower Leg o Three times weekly - [facility] Tuesday and Saturdays dressing changes. Patient comes to  clinic on Thursdays. Follow-up Appointments Wound #3 Left,Lateral Lower Leg o Return Appointment in 2 weeks. Edema Control Wound #3 Left,Lateral Lower Leg o Elevate legs to the level of the heart and pump ankles as often as possible o Other: - ACE wrap Additional Orders / Instructions Wound #3 Left,Lateral Lower Leg o Vitamin A; Vitamin C, Zinc - Please add a multivitamin that has 100% of vitamin A, vitamin C and zinc supplements in it [name], [name]. ([id number]) o Increase protein intake. o Activity as tolerated Home Health Wound #3 Left,Lateral Lower Leg o [place] Nurse may visit PRN to address patientos wound care needs. o FACE TO FACE ENCOUNTER: MEDICARE and MEDICAID PATIENTS: I certify that this patient is under my care and that I had a face-to-face encounter that meets the physician face-to-face encounter requirements with this patient on this date. The encounter with the patient was in whole or in part for the following MEDICAL CONDITION: (primary reason for [place]) MEDICAL NECESSITY: I certify, that based on my findings, NURSING services are a medically necessary home health service. HOME BOUND STATUS: I certify that my clinical findings support that this patient is homebound (i.e., Due to illness or injury, pt requires aid of supportive devices such as crutches, cane, wheelchairs, walkers, the use of special transportation or the assistance of another person to leave their place of residence. There is a normal inability to leave the home and doing so requires considerable and taxing effort. Other absences are for medical  reasons / religious services and are infrequent or of short duration when for other reasons). o If current dressing causes regression in wound condition, may D/C ordered dressing product/s and apply Normal Saline Moist Dressing daily until next [place] / Other MD appointment. [place] of regression in wound condition at [phone]. o Please direct any NON-WOUND related issues/requests for orders to patient's Primary Care Physician Electronic Signature(s) Signed: 06/18/2018 5:08:41 PM By: [name] PA-C Signed: 06/18/2018 5:25:24 PM By: Montey Hora Entered By: Montey Hora on 06/18/2018 10:53:34 Schimek, [name] ([id number]) -------------------------------------------------------------------------------- Problem List Details Patient Name: SKYELER, SCALESE. Date of Service: 06/18/2018 10:15 AM Medical Record Number: [id number] Patient Account Number: [id number] Date of Birth/Sex: February 06, 1936 (82 y.o. F) Treating RN: Montey Hora Primary Care Provider: Fenton Malling Other Clinician: Referring Provider: Fenton Malling Treating Provider/Extender: Melburn Hake, HOYT Weeks in Treatment: 8 Active Problems ICD-10 Evaluated Encounter Code Description Active Date Today Diagnosis S81.812S Laceration without foreign body, left lower leg, sequela 04/23/2018 No Yes S80.212S Abrasion, left knee, sequela 04/23/2018 No Yes S80.211S Abrasion, right knee, sequela 04/23/2018 No Yes L97.822 Non-pressure chronic ulcer of other part of left lower leg with 05/14/2018 No Yes fat layer exposed Inactive Problems Resolved Problems Electronic Signature(s) Signed: 06/18/2018 5:08:41 PM By: [name] PA-C Entered By: [name] on 06/18/2018 10:25:17 Edwin, [name] ([id number]) -------------------------------------------------------------------------------- Progress Note Details Patient Name: [name]. Date of Service: 06/18/2018  10:15 AM Medical Record Number: [id number] Patient Account Number: [id number] Date of Birth/Sex: [birth year] (82 y.o. F) Treating RN: Montey Hora Primary Care Provider: Fenton Malling Other Clinician: Referring Provider: Fenton Malling Treating Provider/Extender: Melburn Hake, HOYT Weeks in Treatment: 8 Subjective Chief Complaint Information obtained from Patient LLE Ulcers History of Present Illness (HPI) 04/23/18-She is seen in initial evaluation for multiple skin tears s/p fall on 8/19. She did sustain a closed fracture of the right patella secondary to that fall. She presented  to emergency department on 8/19 for treatment. The left leg laceration was unable to be sutured closed, Steri-Strips were applied and she was treated with knee immobilizer for the patella fracture. She continues on Keflex that was initiated on 8/19. The left leg skin tear flap was cleansed and reapproximated, secure with steri strips. She will follow up next week 04/30/18-She is seen in follow-up evaluation for multiple skin tears. She continues with knee immobilizers to the right lower extremity, has developed lower extremity edema with subsequent weeping; will initiate ace wrap compression. The flap is mostly devitalized, which was somewhat expected; we will change treatment plan and she will follow up next week 05/07/18-She is seen in follow-up evaluation for multiple wounds. The left lateral lower leg skin flap is nonviable/devitalized, now eschar covered. She tolerated debridement fairly well. The right lower extremity skin tear that was new last week has essentially healed. She has responded nicely to Ace wrap compression. We will modify treatment plan and she will follow-up next week 05/14/18 on evaluation today patient actually appears to be doing a little better in regard to her left lower extremity ulcers. With that being said she has been tolerating the dressing changes without complication. Medihoney  is what she was switch to dear in the last week's visit. Prior to that we used Iodoflex when she had the eschar present. Nonetheless I believe that the Iodoflex may be the best thing for her at this point in regard to the current wound bed and prevention of biofilm buildup. Nonetheless she seems to be tolerating the dressing changes without complication which is good news. It's really the debridement the calls are the most pain last week she did allow for debridement this week she also agreed to allow me to attempt debridement in order to try to help this wound to heal more quickly. 05/21/18 on evaluation today patient actually appears to be doing much better at this time in regard to her left lateral lower Trinity ulcer. She's been tolerating the dressing changes without complication. Fortunately there does not appear to be any evidence of infection at this time. There is still some Slough noted on the surface of the wound although not as much as previous. She continues to have a lot of discomfort currently. She tells me that once we put the Iodoflex on the last time it actually burned from Thursday till Sunday night and then eased off. Nonetheless it does seem to have made a great improvement in general in regard to the patient's wound bed. 05/28/18 patient's wound bed at this point actually show signs of good improvement which is excellent news currently. She has been tolerating the dressing changes without complication. She does have a little bit of slightly hyper granular tissue at this point there's also some necrotic tissue noted on the surface of the wound. There does not appear to be any evidence of infection overall at this time. In general I feel like that she has made good progress in the past two weeks that I have been seeing her. 06/04/18 on evaluation today patient's left lower Trinity ulcer actually appears to be doing very well today. She has great improvement with excellent  epithelialization at this time. Overall I feel like she has made great progress even since last time I seen her. There's really no necrotic tissue on the surface of the wound which is also great news her pain appears to be better. 06/18/18 on evaluation today the patient continues to show signs of improvement  in regard to her lower extremity ulcer. She's [name], [name] ([id number]) been tolerating the [facility] Dressing without complication and the wound bed seems to be doing excellent at this time. Overall I'm very pleased with the progress and were things stand. Patient History Information obtained from Patient. Family History Cancer - Mother, Lung Disease - Siblings, No family history of Diabetes, Heart Disease, Hereditary Spherocytosis, Hypertension, Kidney Disease, Seizures, Stroke, Thyroid Problems, Tuberculosis. Social History Never smoker, Marital Status - Married, Alcohol Use - Never, Drug Use - No History, Caffeine Use - Daily. Medical And Surgical History Notes Respiratory pulmonary fibrosis, home O2 at night and PRN Gastrointestinal diverticulosis Review of Systems (ROS) Constitutional Symptoms (General Health) Denies complaints or symptoms of Fever, Chills. Respiratory The patient has no complaints or symptoms. Cardiovascular The patient has no complaints or symptoms. Psychiatric The patient has no complaints or symptoms. Objective Constitutional Well-nourished and well-hydrated in no acute distress. Vitals Time Taken: 10:25 AM, Height: 60 in, Weight: 151 lbs, BMI: 29.5, Temperature: 97.6 F, Pulse: 55 bpm, Respiratory Rate: 16 breaths/min, Blood Pressure: 136/53 mmHg. Respiratory normal breathing without difficulty. clear to auscultation bilaterally. Cardiovascular regular rate and rhythm with normal S1, S2. Psychiatric this patient is able to make decisions and demonstrates good insight into disease process. Alert and Oriented x 3. pleasant and  cooperative. [name], [name] ([id number]) General Notes: Patient's wound bed currently shows evidence of good granulation there does not appear to be any evidence of infection at this time which is great news. Overall I'm very pleased with how things seem to be progressing. Integumentary (Hair, Skin) Wound #3 status is Open. Original cause of wound was Trauma. The wound is located on the Left,Lateral Lower Leg. The wound measures 3.5cm length x 4.5cm width x 0.1cm depth; 12.37cm^2 area and 1.237cm^3 volume. There is Fat Layer (Subcutaneous Tissue) Exposed exposed. There is no tunneling or undermining noted. There is a small amount of serosanguineous drainage noted. The wound margin is flat and intact. There is large (67-100%) red, hyper - granulation within the wound bed. There is a small (1-33%) amount of necrotic tissue within the wound bed including Eschar and Adherent Slough. The periwound skin appearance did not exhibit: Callus, Crepitus, Excoriation, Induration, Rash, Scarring, Dry/Scaly, Maceration, Atrophie Blanche, Cyanosis, Ecchymosis, Hemosiderin Staining, Mottled, Pallor, Rubor, Erythema. Periwound temperature was noted as No Abnormality. The periwound has tenderness on palpation. Assessment Active Problems ICD-10 Laceration without foreign body, left lower leg, sequela Abrasion, left knee, sequela Abrasion, right knee, sequela Non-pressure chronic ulcer of other part of left lower leg with fat layer exposed Plan Wound Cleansing: Wound #3 Left,Lateral Lower Leg: Clean wound with Normal Saline. Anesthetic (add to Medication List): Wound #3 Left,Lateral Lower Leg: Topical Lidocaine 4% cream applied to wound bed prior to debridement (In Clinic Only). Primary Wound Dressing: Wound #3 Left,Lateral Lower Leg: Hydrafera Blue Ready Transfer Secondary Dressing: Wound #3 Left,Lateral Lower Leg: ABD and Kerlix/Conform - ace wrap Dressing Change Frequency: Wound #3 Left,Lateral  Lower Leg: Three times weekly - [facility] Tuesday and Saturdays dressing changes. Patient comes to clinic on Thursdays. Follow-up Appointments: Wound #3 Left,Lateral Lower Leg: Return Appointment in 2 weeks. Edema Control: Wound #3 Left,Lateral Lower Leg: Elevate legs to the level of the heart and pump ankles as often as possible Other: - ACE wrap Additional Orders / Instructions: Wound #3 Left,Lateral Lower Leg: Vitamin A; Vitamin C, Zinc - Please add a multivitamin that has 100% of vitamin A, vitamin C and zinc supplements in  it [name], [name] ([id number]) Increase protein intake. Activity as tolerated Home Health: Wound #3 Left,Lateral Lower Leg: [place] Nurse may visit PRN to address patient s wound care needs. FACE TO FACE ENCOUNTER: MEDICARE and MEDICAID PATIENTS: I certify that this patient is under my care and that I had a face-to-face encounter that meets the physician face-to-face encounter requirements with this patient on this date. The encounter with the patient was in whole or in part for the following MEDICAL CONDITION: (primary reason for [place]) MEDICAL NECESSITY: I certify, that based on my findings, NURSING services are a medically necessary home health service. HOME BOUND STATUS: I certify that my clinical findings support that this patient is homebound (i.e., Due to illness or injury, pt requires aid of supportive devices such as crutches, cane, wheelchairs, walkers, the use of special transportation or the assistance of another person to leave their place of residence. There is a normal inability to leave the home and doing so requires considerable and taxing effort. Other absences are for medical reasons / religious services and are infrequent or of short duration when for other reasons). If current dressing causes regression in wound condition, may D/C ordered dressing product/s and apply Normal Saline Moist Dressing daily  until next [place] / Other MD appointment. [place] of regression in wound condition at [phone]. Please direct any NON-WOUND related issues/requests for orders to patient's Primary Care Physician I'm gonna suggest currently that we continue with the above wound care measures. She's in agreement with plan. If anything changes or worsens in the meantime the patient will let me know. Otherwise will see where everything stands in two weeks time was your back for reevaluation. She's not shown significant improvement by that time I would consider looking toward a skin substitute to help out with additional healing. With that being said up to this point she is continue to show signs of good improvement. Please see above for specific wound care orders. We will see patient for re-evaluation in 2 week(s) here in the clinic. If anything worsens or changes patient will contact our office for additional recommendations. Electronic Signature(s) Signed: 06/18/2018 5:08:41 PM By: [name] PA-C Entered By: [name] on 06/18/2018 11:04:00 [name], [name] ([id number]) -------------------------------------------------------------------------------- ROS/PFSH Details Patient Name: [name], [name]. Date of Service: 06/18/2018 10:15 AM Medical Record Number: [id number] Patient Account Number: [id number] Date of Birth/Sex: 16-Sep-1935 (82 y.o. F) Treating RN: Montey Hora Primary Care Provider: Fenton Malling Other Clinician: Referring Provider: Fenton Malling Treating Provider/Extender: Melburn Hake, HOYT Weeks in Treatment: 8 Information Obtained From Patient Wound History Do you currently have one or more open woundso Yes How many open wounds do you currently haveo 2 Approximately how long have you had your woundso 4 days How have you been treating your wound(s) until nowo bandage from the ER Has your wound(s) ever healed and then re-openedo  No Have you had any lab work done in the past montho Yes Who ordered the lab work doneo [facility] ED Have you tested positive for an antibiotic resistant organism (MRSA, VRE)o No Have you tested positive for osteomyelitis (bone infection)o No Have you had any tests for circulation on your legso No Have you had other problems associated with your woundso Swelling Constitutional Symptoms (General Health) Complaints and Symptoms: Negative for: Fever; Chills Eyes Medical History: Negative for: Cataracts; Glaucoma; Optic Neuritis Ear/Nose/Mouth/Throat Medical History: Negative for: Chronic sinus problems/congestion; Middle ear problems Hematologic/Lymphatic  Medical History: Negative for: Anemia; Hemophilia; Human Immunodeficiency Virus; Lymphedema; Sickle Cell Disease Respiratory Complaints and Symptoms: No Complaints or Symptoms Medical History: Negative for: Aspiration; Asthma; Chronic Obstructive Pulmonary Disease (COPD); Pneumothorax; Sleep Apnea; Tuberculosis Past Medical History Notes: pulmonary fibrosis, home O2 at night and PRN Cardiovascular LARSEN, ZETTEL ([id number]) Complaints and Symptoms: No Complaints or Symptoms Medical History: Positive for: Hypertension Negative for: Angina; Arrhythmia; Congestive Heart Failure; Coronary Artery Disease; Deep Vein Thrombosis; Hypotension; Myocardial Infarction; Peripheral Arterial Disease; Peripheral Venous Disease; Phlebitis; Vasculitis Gastrointestinal Medical History: Negative for: Cirrhosis ; Colitis; Crohnos; Hepatitis A; Hepatitis B; Hepatitis C Past Medical History Notes: diverticulosis Endocrine Medical History: Negative for: Type I Diabetes; Type II Diabetes Genitourinary Medical History: Negative for: End Stage Renal Disease Immunological Medical History: Negative for: Lupus Erythematosus; Raynaudos; Scleroderma Integumentary (Skin) Medical History: Negative for: History of Burn; History of pressure  wounds Musculoskeletal Medical History: Positive for: Osteoarthritis Negative for: Gout; Rheumatoid Arthritis; Osteomyelitis Neurologic Medical History: Negative for: Dementia; Neuropathy; Quadriplegia; Paraplegia; Seizure Disorder Oncologic Medical History: Negative for: Received Chemotherapy; Received Radiation Psychiatric Complaints and Symptoms: No Complaints or Symptoms Medical History: Negative for: Anorexia/bulimia; Confinement Anxiety [name], COLTRANE. ([id number]) Immunizations Pneumococcal Vaccine: Received Pneumococcal Vaccination: Yes Implantable Devices Family and Social History Cancer: Yes - Mother; Diabetes: No; Heart Disease: No; Hereditary Spherocytosis: No; Hypertension: No; Kidney Disease: No; Lung Disease: Yes - Siblings; Seizures: No; Stroke: No; Thyroid Problems: No; Tuberculosis: No; Never smoker; Marital Status - Married; Alcohol Use: Never; Drug Use: No History; Caffeine Use: Daily; Financial Concerns: No; Food, Clothing or Shelter Needs: No; Support System Lacking: No; Transportation Concerns: No; Advanced Directives: No; Patient does not want information on Advanced Directives Physician Affirmation I have reviewed and agree with the above information. Electronic Signature(s) Signed: 06/18/2018 5:08:41 PM By: [name] PA-C Signed: 06/18/2018 5:25:24 PM By: Montey Hora Entered By: [name] on 06/18/2018 11:03:07 GENENE, KILMAN ([id number]) -------------------------------------------------------------------------------- SuperBill Details Patient Name: LERONDA, LEWERS. Date of Service: 06/18/2018 Medical Record Number: [id number] Patient Account Number: [id number] Date of Birth/Sex: [birth year] (82 y.o. F) Treating RN: Montey Hora Primary Care Provider: Fenton Malling Other Clinician: Referring Provider: Fenton Malling Treating Provider/Extender: Melburn Hake, HOYT Weeks in Treatment: 8 Diagnosis Coding ICD-10  Codes Code Description [phone] Laceration without foreign body, left lower leg, sequela S80.212S Abrasion, left knee, sequela S80.211S Abrasion, right knee, sequela L97.822 Non-pressure chronic ulcer of other part of left lower leg with fat layer exposed Facility Procedures CPT4 Code: 10211173 Description: [place] VISIT-LEV 3 EST PT Modifier: Quantity: 1 Physician Procedures CPT4 Code Description: [id number] [id number] - WC PHYS LEVEL 3 - EST PT ICD-10 Diagnosis Description S81.812S Laceration without foreign body, left lower leg, sequela S80.212S Abrasion, left knee, sequela S80.211S Abrasion, right knee, sequela L97.822  Non-pressure chronic ulcer of other part of left lower leg wit Modifier: h fat layer expos Quantity: 1 ed Electronic Signature(s) Signed: 06/18/2018 5:08:41 PM By: [name] PA-C Entered By: [name] on 06/18/2018 11:04:19

## 2018-06-22 DIAGNOSIS — Z01811 Encounter for preprocedural respiratory examination: Secondary | ICD-10-CM | POA: Diagnosis not present

## 2018-06-22 DIAGNOSIS — M4856XD Collapsed vertebra, not elsewhere classified, lumbar region, subsequent encounter for fracture with routine healing: Secondary | ICD-10-CM | POA: Diagnosis not present

## 2018-06-22 DIAGNOSIS — S80211D Abrasion, right knee, subsequent encounter: Secondary | ICD-10-CM | POA: Diagnosis not present

## 2018-06-22 DIAGNOSIS — S80212D Abrasion, left knee, subsequent encounter: Secondary | ICD-10-CM | POA: Diagnosis not present

## 2018-06-22 DIAGNOSIS — Z01810 Encounter for preprocedural cardiovascular examination: Secondary | ICD-10-CM | POA: Diagnosis not present

## 2018-06-22 DIAGNOSIS — S81811D Laceration without foreign body, right lower leg, subsequent encounter: Secondary | ICD-10-CM | POA: Diagnosis not present

## 2018-06-22 DIAGNOSIS — S81812D Laceration without foreign body, left lower leg, subsequent encounter: Secondary | ICD-10-CM | POA: Diagnosis not present

## 2018-06-22 DIAGNOSIS — F419 Anxiety disorder, unspecified: Secondary | ICD-10-CM | POA: Diagnosis not present

## 2018-06-22 DIAGNOSIS — S82001D Unspecified fracture of right patella, subsequent encounter for closed fracture with routine healing: Secondary | ICD-10-CM | POA: Diagnosis not present

## 2018-06-22 DIAGNOSIS — Z01818 Encounter for other preprocedural examination: Secondary | ICD-10-CM | POA: Diagnosis not present

## 2018-06-22 DIAGNOSIS — Z01812 Encounter for preprocedural laboratory examination: Secondary | ICD-10-CM | POA: Diagnosis not present

## 2018-06-22 DIAGNOSIS — J84112 Idiopathic pulmonary fibrosis: Secondary | ICD-10-CM | POA: Diagnosis not present

## 2018-06-22 DIAGNOSIS — S32000A Wedge compression fracture of unspecified lumbar vertebra, initial encounter for closed fracture: Secondary | ICD-10-CM | POA: Diagnosis not present

## 2018-06-24 DIAGNOSIS — S82001D Unspecified fracture of right patella, subsequent encounter for closed fracture with routine healing: Secondary | ICD-10-CM | POA: Diagnosis not present

## 2018-06-24 DIAGNOSIS — J84112 Idiopathic pulmonary fibrosis: Secondary | ICD-10-CM | POA: Diagnosis not present

## 2018-06-24 DIAGNOSIS — S80211D Abrasion, right knee, subsequent encounter: Secondary | ICD-10-CM | POA: Diagnosis not present

## 2018-06-24 DIAGNOSIS — Z01818 Encounter for other preprocedural examination: Secondary | ICD-10-CM | POA: Diagnosis not present

## 2018-06-24 DIAGNOSIS — S81811D Laceration without foreign body, right lower leg, subsequent encounter: Secondary | ICD-10-CM | POA: Diagnosis not present

## 2018-06-24 DIAGNOSIS — R0602 Shortness of breath: Secondary | ICD-10-CM | POA: Diagnosis not present

## 2018-06-24 DIAGNOSIS — S81812D Laceration without foreign body, left lower leg, subsequent encounter: Secondary | ICD-10-CM | POA: Diagnosis not present

## 2018-06-24 DIAGNOSIS — S80212D Abrasion, left knee, subsequent encounter: Secondary | ICD-10-CM | POA: Diagnosis not present

## 2018-06-25 ENCOUNTER — Encounter: Payer: Medicare Other | Admitting: Physician Assistant

## 2018-06-25 DIAGNOSIS — J8410 Pulmonary fibrosis, unspecified: Secondary | ICD-10-CM | POA: Diagnosis not present

## 2018-06-25 DIAGNOSIS — M810 Age-related osteoporosis without current pathological fracture: Secondary | ICD-10-CM | POA: Diagnosis not present

## 2018-06-25 DIAGNOSIS — M4856XD Collapsed vertebra, not elsewhere classified, lumbar region, subsequent encounter for fracture with routine healing: Secondary | ICD-10-CM | POA: Diagnosis not present

## 2018-06-25 DIAGNOSIS — M4854XA Collapsed vertebra, not elsewhere classified, thoracic region, initial encounter for fracture: Secondary | ICD-10-CM | POA: Diagnosis not present

## 2018-06-27 DIAGNOSIS — S81812D Laceration without foreign body, left lower leg, subsequent encounter: Secondary | ICD-10-CM | POA: Diagnosis not present

## 2018-06-27 DIAGNOSIS — S80212D Abrasion, left knee, subsequent encounter: Secondary | ICD-10-CM | POA: Diagnosis not present

## 2018-06-27 DIAGNOSIS — J84112 Idiopathic pulmonary fibrosis: Secondary | ICD-10-CM | POA: Diagnosis not present

## 2018-06-27 DIAGNOSIS — S82001D Unspecified fracture of right patella, subsequent encounter for closed fracture with routine healing: Secondary | ICD-10-CM | POA: Diagnosis not present

## 2018-06-27 DIAGNOSIS — S80211D Abrasion, right knee, subsequent encounter: Secondary | ICD-10-CM | POA: Diagnosis not present

## 2018-06-27 DIAGNOSIS — S81811D Laceration without foreign body, right lower leg, subsequent encounter: Secondary | ICD-10-CM | POA: Diagnosis not present

## 2018-06-30 DIAGNOSIS — S82001D Unspecified fracture of right patella, subsequent encounter for closed fracture with routine healing: Secondary | ICD-10-CM | POA: Diagnosis not present

## 2018-06-30 DIAGNOSIS — J84112 Idiopathic pulmonary fibrosis: Secondary | ICD-10-CM | POA: Diagnosis not present

## 2018-06-30 DIAGNOSIS — S81811D Laceration without foreign body, right lower leg, subsequent encounter: Secondary | ICD-10-CM | POA: Diagnosis not present

## 2018-06-30 DIAGNOSIS — S80211D Abrasion, right knee, subsequent encounter: Secondary | ICD-10-CM | POA: Diagnosis not present

## 2018-06-30 DIAGNOSIS — S81812D Laceration without foreign body, left lower leg, subsequent encounter: Secondary | ICD-10-CM | POA: Diagnosis not present

## 2018-06-30 DIAGNOSIS — S80212D Abrasion, left knee, subsequent encounter: Secondary | ICD-10-CM | POA: Diagnosis not present

## 2018-07-01 DIAGNOSIS — S81812D Laceration without foreign body, left lower leg, subsequent encounter: Secondary | ICD-10-CM | POA: Diagnosis not present

## 2018-07-01 DIAGNOSIS — I10 Essential (primary) hypertension: Secondary | ICD-10-CM | POA: Diagnosis not present

## 2018-07-01 DIAGNOSIS — J84112 Idiopathic pulmonary fibrosis: Secondary | ICD-10-CM | POA: Diagnosis not present

## 2018-07-01 DIAGNOSIS — Z9981 Dependence on supplemental oxygen: Secondary | ICD-10-CM | POA: Diagnosis not present

## 2018-07-01 DIAGNOSIS — M4856XS Collapsed vertebra, not elsewhere classified, lumbar region, sequela of fracture: Secondary | ICD-10-CM | POA: Diagnosis not present

## 2018-07-01 DIAGNOSIS — M150 Primary generalized (osteo)arthritis: Secondary | ICD-10-CM | POA: Diagnosis not present

## 2018-07-01 DIAGNOSIS — Z9181 History of falling: Secondary | ICD-10-CM | POA: Diagnosis not present

## 2018-07-02 ENCOUNTER — Encounter: Payer: Medicare Other | Admitting: Physician Assistant

## 2018-07-02 DIAGNOSIS — I10 Essential (primary) hypertension: Secondary | ICD-10-CM | POA: Diagnosis not present

## 2018-07-02 DIAGNOSIS — S80211A Abrasion, right knee, initial encounter: Secondary | ICD-10-CM | POA: Diagnosis not present

## 2018-07-02 DIAGNOSIS — S81812A Laceration without foreign body, left lower leg, initial encounter: Secondary | ICD-10-CM | POA: Diagnosis not present

## 2018-07-02 DIAGNOSIS — I2510 Atherosclerotic heart disease of native coronary artery without angina pectoris: Secondary | ICD-10-CM | POA: Diagnosis not present

## 2018-07-02 DIAGNOSIS — L97822 Non-pressure chronic ulcer of other part of left lower leg with fat layer exposed: Secondary | ICD-10-CM | POA: Diagnosis not present

## 2018-07-02 DIAGNOSIS — Z86718 Personal history of other venous thrombosis and embolism: Secondary | ICD-10-CM | POA: Diagnosis not present

## 2018-07-04 ENCOUNTER — Ambulatory Visit: Payer: Medicare Other

## 2018-07-04 DIAGNOSIS — I10 Essential (primary) hypertension: Secondary | ICD-10-CM | POA: Diagnosis not present

## 2018-07-04 DIAGNOSIS — S81812D Laceration without foreign body, left lower leg, subsequent encounter: Secondary | ICD-10-CM | POA: Diagnosis not present

## 2018-07-04 DIAGNOSIS — M150 Primary generalized (osteo)arthritis: Secondary | ICD-10-CM | POA: Diagnosis not present

## 2018-07-04 DIAGNOSIS — M4856XS Collapsed vertebra, not elsewhere classified, lumbar region, sequela of fracture: Secondary | ICD-10-CM | POA: Diagnosis not present

## 2018-07-04 DIAGNOSIS — Z9181 History of falling: Secondary | ICD-10-CM | POA: Diagnosis not present

## 2018-07-04 DIAGNOSIS — J84112 Idiopathic pulmonary fibrosis: Secondary | ICD-10-CM | POA: Diagnosis not present

## 2018-07-05 NOTE — Progress Notes (Signed)
BRAELIN, RUMMLER ([id number]) Visit Report for 07/02/2018 Arrival Information Details Patient Name: [name], [name]. Date of Service: 07/02/2018 10:15 AM Medical Record Number: [id number] Patient Account Number: [id number] Date of Birth/Sex: [birth year] (82 y.o. F) Treating RN: Curtis Sites Primary Care Trystyn Sitts: Joycelyn Man Other Clinician: Referring Shonika Kolasinski: Joycelyn Man Treating Kessa Fairbairn/Extender: Linwood Dibbles, HOYT Weeks in Treatment: 10 Visit Information History Since Last Visit Added or deleted any medications: No Patient Arrived: [place] Any new allergies or adverse reactions: No Arrival Time: 10:10 Had a fall or experienced change in No Accompanied By: self activities of daily living that may affect Transfer Assistance: None risk of falls: Patient Identification Verified: Yes Signs or symptoms of abuse/neglect since last visito No Secondary Verification Process Completed: Yes Hospitalized since last visit: No Implantable device outside of the clinic excluding No cellular tissue based products placed in the center since last visit: Has Dressing in Place as Prescribed: Yes Pain Present Now: No Electronic Signature(s) Signed: 07/02/2018 2:15:53 PM By: Dayton Martes RCP, RRT, CHT Entered By: Dayton Martes on 07/02/2018 10:11:27 [name] ([id number]) -------------------------------------------------------------------------------- Encounter Discharge Information Details Patient Name: AMARRAH, FAIN. Date of Service: 07/02/2018 10:15 AM Medical Record Number: [id number] Patient Account Number: [id number] Date of Birth/Sex: December 01, 1935 (82 y.o. F) Treating RN: Curtis Sites Primary Care Tashanna Dolin: Joycelyn Man Other Clinician: Referring Mc Bloodworth: Joycelyn Man Treating Daisuke Bailey/Extender: Linwood Dibbles, HOYT Weeks in Treatment: 10 Encounter Discharge Information Items Post Procedure Vitals Discharge Condition:  Stable Temperature (F): 98.1 Ambulatory Status: Ambulatory Pulse (bpm): 59 Discharge Destination: Home Respiratory Rate (breaths/min): 16 Transportation: Private Auto Blood Pressure (mmHg): 148/76 Accompanied By: self Schedule Follow-up Appointment: Yes Clinical Summary of Care: Electronic Signature(s) Signed: 07/03/2018 5:38:25 PM By: Curtis Sites Entered By: Curtis Sites on 07/02/2018 10:45:21 Balliet, Marcine Matar ([id number]) -------------------------------------------------------------------------------- Lower Extremity Assessment Details Patient Name: AZAIAH, POSILLICO. Date of Service: 07/02/2018 10:15 AM Medical Record Number: [id number] Patient Account Number: [id number] Date of Birth/Sex: [birth year] (82 y.o. F) Treating RN: Rema Jasmine Primary Care Wood Novacek: Joycelyn Man Other Clinician: Referring Brennan Karam: Joycelyn Man Treating Samentha Perham/Extender: Linwood Dibbles, HOYT Weeks in Treatment: 10 Edema Assessment Assessed: Kyra Searles: No] [Right: No] [Left: Edema] [Right: :] Calf Left: Right: Point of Measurement: 30 cm From Medial Instep 29 cm cm Ankle Left: Right: Point of Measurement: 10 cm From Medial Instep 18 cm cm Vascular Assessment Claudication: Claudication Assessment [Left:None] Pulses: Dorsalis Pedis Palpable: [Left:Yes] Posterior Tibial Extremity colors, hair growth, and conditions: Extremity Color: [Left:Normal] Hair Growth on Extremity: [Left:No] Temperature of Extremity: [Left:Cool] Capillary Refill: [Left:< 3 seconds] Toe Nail Assessment Left: Right: Thick: No Discolored: No Deformed: No Improper Length and Hygiene: No Electronic Signature(s) Signed: 07/02/2018 11:42:53 AM By: Rema Jasmine Entered By: Rema Jasmine on 07/02/2018 10:21:41 Geimer, Marcine Matar ([id number]) -------------------------------------------------------------------------------- Multi Wound Chart Details Patient Name: [name]. Date of Service: 07/02/2018 10:15  AM Medical Record Number: [id number] Patient Account Number: [id number] Date of Birth/Sex: 1935/11/25 (82 y.o. F) Treating RN: Curtis Sites Primary Care Geovonni Meyerhoff: Joycelyn Man Other Clinician: Referring Massiah Minjares: Joycelyn Man Treating Swayzee Wadley/Extender: Linwood Dibbles, HOYT Weeks in Treatment: 10 Vital Signs Height(in): 60 Pulse(bpm): 59 Weight(lbs): 151 Blood Pressure(mmHg): 148/76 Body Mass Index(BMI): 29 Temperature(F): 98.1 Respiratory Rate 16 (breaths/min): Photos: [N/A:N/A] Wound Location: Left Lower Leg - Lateral N/A N/A Wounding Event: Trauma N/A N/A Primary Etiology: Skin Tear N/A N/A Comorbid History: Hypertension, Osteoarthritis N/A N/A Date Acquired: 04/20/2018 N/A N/A Weeks of Treatment: 10 N/A N/A Wound Status: Open N/A N/A Measurements L x W x  D 2.3x3.5x0.1 N/A N/A (cm) Area (cm) : 6.322 N/A N/A Volume (cm) : 0.632 N/A N/A % Reduction in Area: 94.40% N/A N/A % Reduction in Volume: 97.20% N/A N/A Classification: Full Thickness Without N/A N/A Exposed Support Structures Exudate Amount: Small N/A N/A Exudate Type: Serosanguineous N/A N/A Exudate Color: red, brown N/A N/A Wound Margin: Flat and Intact N/A N/A Granulation Amount: Large (67-100%) N/A N/A Granulation Quality: Red, Hyper-granulation N/A N/A Necrotic Amount: Small (1-33%) N/A N/A Necrotic Tissue: Eschar, Adherent Slough N/A N/A Exposed Structures: Fat Layer (Subcutaneous N/A N/A Tissue) Exposed: Yes Fascia: No Tendon: No Muscle: No PENNY, MANOLIS. ([id number]) Joint: No Bone: No Epithelialization: Small (1-33%) N/A N/A Periwound Skin Texture: Excoriation: No N/A N/A Induration: No Callus: No Crepitus: No Rash: No Scarring: No Periwound Skin Moisture: Maceration: No N/A N/A Dry/Scaly: No Periwound Skin Color: Atrophie Blanche: No N/A N/A Cyanosis: No Ecchymosis: No Erythema: No Hemosiderin Staining: No Mottled: No Pallor: No Rubor: No Temperature: No  Abnormality N/A N/A Tenderness on Palpation: Yes N/A N/A Wound Preparation: Ulcer Cleansing: N/A N/A Rinsed/Irrigated with Saline Topical Anesthetic Applied: Other: lidocaine 4% Treatment Notes Electronic Signature(s) Signed: 07/03/2018 5:38:25 PM By: Curtis Sites Entered By: Curtis Sites on 07/02/2018 10:40:58 ANDREAN, HATHWAY ([id number]) -------------------------------------------------------------------------------- Multi-Disciplinary Care Plan Details Patient Name: LANETTA, SARAH. Date of Service: 07/02/2018 10:15 AM Medical Record Number: [id number] Patient Account Number: [id number] Date of Birth/Sex: 1936-01-12 (82 y.o. F) Treating RN: Curtis Sites Primary Care Kavonte Bearse: Joycelyn Man Other Clinician: Referring Saed Hudlow: Joycelyn Man Treating Sorayah Schrodt/Extender: Linwood Dibbles, HOYT Weeks in Treatment: 10 Active Inactive ` Abuse / Safety / Falls / Self Care Management Nursing Diagnoses: Impaired physical mobility Goals: Patient will remain injury free related to falls Date Initiated: 04/23/2018 Target Resolution Date: 07/03/2018 Goal Status: Active Interventions: Assess fall risk on admission and as needed Notes: ` Orientation to the Wound Care Program Nursing Diagnoses: Knowledge deficit related to the wound healing center program Goals: Patient/caregiver will verbalize understanding of the Wound Healing Center Program Date Initiated: 04/23/2018 Target Resolution Date: 07/03/2018 Goal Status: Active Interventions: Provide education on orientation to the wound center Notes: ` Pain, Acute or Chronic Nursing Diagnoses: Pain, acute or chronic: actual or potential Goals: Patient/caregiver will verbalize adequate pain control between visits Date Initiated: 04/23/2018 Target Resolution Date: 07/03/2018 Goal Status: Active Interventions: NERVA, PALMBERG ([id number]) Assess comfort goal upon admission Notes: ` Wound/Skin Impairment Nursing  Diagnoses: Impaired tissue integrity Goals: Ulcer/skin breakdown will heal within 14 weeks Date Initiated: 04/23/2018 Target Resolution Date: 07/03/2018 Goal Status: Active Interventions: Assess patient/caregiver ability to obtain necessary supplies Assess patient/caregiver ability to perform ulcer/skin care regimen upon admission and as needed Assess ulceration(s) every visit Notes: Electronic Signature(s) Signed: 07/03/2018 5:38:25 PM By: Curtis Sites Entered By: Curtis Sites on 07/02/2018 10:40:24 Hitchman, Marcine Matar ([id number]) -------------------------------------------------------------------------------- Pain Assessment Details Patient Name: TANYIA, HYRE. Date of Service: 07/02/2018 10:15 AM Medical Record Number: [id number] Patient Account Number: [id number] Date of Birth/Sex: 07-08-1936 (82 y.o. F) Treating RN: Curtis Sites Primary Care Renelda Kilian: Joycelyn Man Other Clinician: Referring Jinnie Onley: Joycelyn Man Treating Daesia Zylka/Extender: Linwood Dibbles, HOYT Weeks in Treatment: 10 Active Problems Location of Pain Severity and Description of Pain Patient Has Paino No Site Locations Pain Management and Medication Current Pain Management: Electronic Signature(s) Signed: 07/02/2018 2:15:53 PM By: Sallee Provencal, RRT, CHT Signed: 07/03/2018 5:38:25 PM By: Curtis Sites Entered By: Dayton Martes on 07/02/2018 10:11:34 [name] ([id number]) -------------------------------------------------------------------------------- Patient/Caregiver Education Details Patient Name: YILDA, BANDEMER.  Date of Service: 07/02/2018 10:15 AM Medical Record Number: [id number] Patient Account Number: [id number] Date of Birth/Gender: February 12, 1936 (82 y.o. F) Treating RN: Curtis Sites Primary Care Physician: Joycelyn Man Other Clinician: Referring Physician: Joycelyn Man Treating Physician/Extender: Skeet Simmer in  Treatment: 10 Education Assessment Education Provided To: Patient Education Topics Provided Wound/Skin Impairment: Handouts: Other: wound care as ordered Methods: Demonstration, Explain/Verbal Responses: State content correctly Electronic Signature(s) Signed: 07/03/2018 5:38:25 PM By: Curtis Sites Entered By: Curtis Sites on 07/02/2018 10:44:29 Toman, Marcine Matar ([id number]) -------------------------------------------------------------------------------- Wound Assessment Details Patient Name: ARAYNA, MARTZALL. Date of Service: 07/02/2018 10:15 AM Medical Record Number: [id number] Patient Account Number: [id number] Date of Birth/Sex: [birth year] (82 y.o. F) Treating RN: Rema Jasmine Primary Care Osmar Howton: Joycelyn Man Other Clinician: Referring Quynh Basso: Joycelyn Man Treating Zakariya Knickerbocker/Extender: Linwood Dibbles, HOYT Weeks in Treatment: 10 Wound Status Wound Number: 3 Primary Etiology: Skin Tear Wound Location: Left Lower Leg - Lateral Wound Status: Open Wounding Event: Trauma Comorbid History: Hypertension, Osteoarthritis Date Acquired: 04/20/2018 Weeks Of Treatment: 10 Clustered Wound: No Photos Photo Uploaded By: Rema Jasmine on 07/02/2018 10:26:07 Wound Measurements Length: (cm) 2.3 Width: (cm) 3.5 Depth: (cm) 0.1 Area: (cm) 6.322 Volume: (cm) 0.632 % Reduction in Area: 94.4% % Reduction in Volume: 97.2% Epithelialization: Small (1-33%) Tunneling: No Undermining: No Wound Description Full Thickness Without Exposed Support Foul Odo Classification: Structures Slough/F Wound Margin: Flat and Intact Exudate Small Amount: Exudate Type: Serosanguineous Exudate Color: red, brown r After Cleansing: No ibrino Yes Wound Bed Granulation Amount: Large (67-100%) Exposed Structure Granulation Quality: Red, Hyper-granulation Fascia Exposed: No Necrotic Amount: Small (1-33%) Fat Layer (Subcutaneous Tissue) Exposed: Yes Necrotic Quality: Eschar, Adherent  Slough Tendon Exposed: No Muscle Exposed: No Joint Exposed: No Bone Exposed: No Molesworth, Kadejah J. ([id number]) Periwound Skin Texture Texture Color No Abnormalities Noted: No No Abnormalities Noted: No Callus: No Atrophie Blanche: No Crepitus: No Cyanosis: No Excoriation: No Ecchymosis: No Induration: No Erythema: No Rash: No Hemosiderin Staining: No Scarring: No Mottled: No Pallor: No Moisture Rubor: No No Abnormalities Noted: No Dry / Scaly: No Temperature / Pain Maceration: No Temperature: No Abnormality Tenderness on Palpation: Yes Wound Preparation Ulcer Cleansing: Rinsed/Irrigated with Saline Topical Anesthetic Applied: Other: lidocaine 4%, Treatment Notes Wound #3 (Left, Lateral Lower Leg) 1. Cleansed with: Clean wound with Normal Saline 2. Anesthetic Topical Lidocaine 4% cream to wound bed prior to debridement Notes Hydrafera Blue, abd, conform secured with ace wrap Electronic Signature(s) Signed: 07/02/2018 11:42:53 AM By: Rema Jasmine Entered By: Rema Jasmine on 07/02/2018 10:19:47 Allis, Marcine Matar ([id number]) -------------------------------------------------------------------------------- Vitals Details Patient Name: ANETTE, DIKE. Date of Service: 07/02/2018 10:15 AM Medical Record Number: [id number] Patient Account Number: [id number] Date of Birth/Sex: [birth year] (82 y.o. F) Treating RN: Curtis Sites Primary Care Kandy Towery: Joycelyn Man Other Clinician: Referring Alyssa Mancera: Joycelyn Man Treating Sally Menard/Extender: Linwood Dibbles, HOYT Weeks in Treatment: 10 Vital Signs Time Taken: 10:10 Temperature (F): 98.1 Height (in): 60 Pulse (bpm): 59 Weight (lbs): 151 Respiratory Rate (breaths/min): 16 Body Mass Index (BMI): 29.5 Blood Pressure (mmHg): 148/76 Reference Range: 80 - 120 mg / dl Electronic Signature(s) Signed: 07/02/2018 2:15:53 PM By: Dayton Martes RCP, RRT, CHT Entered By: Dayton Martes on  07/02/2018 10:13:38

## 2018-07-05 NOTE — Progress Notes (Signed)
she has been tolerating the dressing changes without complication. Medihoney is what she was switch to dear in the last week's visit. Prior to that we used Iodoflex when she had the eschar present. Nonetheless I believe that the Iodoflex may be the best thing for her at this point in regard to the current wound bed and prevention of biofilm buildup. Nonetheless she seems to be tolerating the dressing changes without complication which is good news. It's really the debridement the calls are the most pain last week she did allow for debridement this week she also agreed to allow me to attempt debridement in order to try to help this wound to heal more quickly. 05/21/18 on evaluation today patient actually appears to be doing much better at this time in regard to her left lateral lower Trinity ulcer. She's been tolerating the dressing changes without complication. Fortunately there does not appear to be any evidence of infection at this time. There is still some Slough noted on the surface of the wound although not as much as previous. She continues to have a lot of discomfort currently. She tells me that once we put the Iodoflex on the last time it actually burned from Thursday till Sunday night and then eased off. Nonetheless it does seem to have made a great improvement in general in regard to the patient's wound bed. 05/28/18 patient's wound bed at this point actually show signs of good  improvement which is excellent news currently. She has been tolerating the dressing changes without complication. She does have a little bit of slightly hyper granular tissue at this point there's also some necrotic tissue noted on the surface of the wound. There does not appear to be any evidence of infection overall at this time. In general I feel like that she has made good progress in the past two weeks that I have been seeing her. 06/04/18 on evaluation today patient's left lower Trinity ulcer actually appears to be doing very well today. She has great improvement with excellent epithelialization at this time. Overall I feel like she has made great progress even since last time I seen her. There's really no necrotic tissue on the surface of the wound which is also great news her pain appears to be better. 06/18/18 on evaluation today the patient continues to show signs of improvement in regard to her lower extremity ulcer. She's been tolerating the Hydrofera Blue Dressing without complication and the wound bed seems to be doing excellent at this time. Overall I'm very pleased with the progress and were things stand. 07/02/18 on evaluation today patient actually appears to be doing excellent in regard to her left lower Trinity ulcer. This is definitely smaller and is shown signs of good improvement. It is going somewhat slow but nonetheless we have a steady Routt, [name] ([id number]) progression of the wound which is great news. Overall very pleased with the progress that has been made. She's having very little pain at this time. Electronic Signature(s) Signed: 07/02/2018 5:20:35 PM By: [name] PA-C Entered By: [name] on 07/02/2018 11:08:00 [name] ([id number]) -------------------------------------------------------------------------------- Physical Exam Details Patient Name: [name], [name]. Date of Service: 07/02/2018 10:15 AM Medical Record Number:  [id number] Patient Account Number: [id number] Date of Birth/Sex: Jun 22, 1936 (82 y.o. F) Treating RN: Montey Hora Primary Care Provider: Fenton Malling Other Clinician: Referring Provider: Fenton Malling Treating Provider/Extender: Melburn Hake, Daisia Slomski Weeks in Treatment: 62 Constitutional Well-nourished and well-hydrated in no acute distress. Respiratory normal breathing  services are a medically necessary home health service. HOME BOUND STATUS: I certify that my clinical findings support that this patient is homebound (i.e., Due to illness or injury, pt requires aid of supportive devices such as crutches, cane, wheelchairs, walkers, the use of special transportation or the assistance of another person to leave their place of residence. There is a normal inability to leave the home and doing so requires considerable and taxing effort. Other absences are for medical reasons / religious services and are infrequent or of short duration when for other reasons). If current dressing causes regression in wound condition, may D/C ordered dressing product/s and apply Normal Saline Moist Dressing daily until next [place] / Other MD appointment. [place] of regression in wound condition at [phone]. Please direct any NON-WOUND related issues/requests for orders to patient's Primary Care Physician I'm gonna recommend at this point in time that we actually continue to manage the patient's wounds with the [facility] Dressing. I think this is going to do well for  her and in fact has up to this point. She's in agreement with that plan. We will subsequently see were things stand at follow-up. If anything changes or worsens the meantime shall contact the office and let me know. Please see above for specific wound care orders. We will see patient for re-evaluation in 2 week(s) here in the clinic. If anything worsens or changes patient will contact our office for additional recommendations. Electronic Signature(s) Signed: 07/02/2018 5:20:35 PM By: [name] PA-C Entered By: [name] on 07/02/2018 11:08:57 [name], [name] ([id number]) -------------------------------------------------------------------------------- ROS/PFSH Details Patient Name: [name]. Date of Service: 07/02/2018 10:15 AM Medical Record Number: [id number] Patient Account Number: [id number] Date of Birth/Sex: [birth year] (82 y.o. F) Treating RN: Montey Hora Primary Care Provider: Fenton Malling Other Clinician: Referring Provider: Fenton Malling Treating Provider/Extender: Melburn Hake, Laylee Schooley Weeks in Treatment: 10 Information Obtained From Patient Wound History Do you currently have one or more open woundso Yes How many open wounds do you currently haveo 2 Approximately how long have you had your woundso 4 days How have you been treating your wound(s) until nowo bandage from the ER Has your wound(s) ever healed and then re-openedo No Have you had any lab work done in the past montho Yes Who ordered the lab work doneo [facility] ED Have you tested positive for an antibiotic resistant organism (MRSA, VRE)o No Have you tested positive for osteomyelitis (bone infection)o No Have you had any tests for circulation on your legso No Have you had other problems associated with your woundso Swelling Constitutional Symptoms (General Health) Complaints and Symptoms: Negative for: Fever; Chills Eyes Medical History: Negative for: Cataracts; Glaucoma; Optic  Neuritis Ear/Nose/Mouth/Throat Medical History: Negative for: Chronic sinus problems/congestion; Middle ear problems Hematologic/Lymphatic Medical History: Negative for: Anemia; Hemophilia; Human Immunodeficiency Virus; Lymphedema; Sickle Cell Disease Respiratory Complaints and Symptoms: No Complaints or Symptoms Medical History: Negative for: Aspiration; Asthma; Chronic Obstructive Pulmonary Disease (COPD); Pneumothorax; Sleep Apnea; Tuberculosis Past Medical History Notes: pulmonary fibrosis, home O2 at night and PRN Cardiovascular MEAGEN, LIMONES ([id number]) Complaints and Symptoms: No Complaints or Symptoms Medical History: Positive for: Hypertension Negative for: Angina; Arrhythmia; Congestive Heart Failure; Coronary Artery Disease; Deep Vein Thrombosis; Hypotension; Myocardial Infarction; Peripheral Arterial Disease; Peripheral Venous Disease; Phlebitis; Vasculitis Gastrointestinal Medical History: Negative for: Cirrhosis ; Colitis; Crohnos; Hepatitis A; Hepatitis B; Hepatitis C Past Medical History Notes: diverticulosis Endocrine Medical History: Negative for: Type I Diabetes; Type II  without difficulty. Psychiatric this patient is able to make decisions and demonstrates good insight into disease process. Alert and Oriented x 3. pleasant and cooperative. Notes Patient's wound bed currently shows evidence of good granulation at this point in time. She does have some slight Slough/biofilm on the surface of the wound which I did actually sharply debrided away today. Post debridement the wound bed appears to be doing better. The entire surface was cleansed. Electronic Signature(s) Signed: 07/02/2018 5:20:35 PM By: [name] PA-C Entered By: [name] on 07/02/2018 11:08:32 [name], [name] ([id number]) -------------------------------------------------------------------------------- Physician Orders Details Patient Name: [name], [name]. Date of Service: 07/02/2018 10:15 AM Medical Record Number: [id number] Patient Account Number: [id number] Date of Birth/Sex: Sep 19, 1935 (82 y.o. F) Treating RN: Montey Hora Primary Care Provider: Fenton Malling Other Clinician: Referring Provider: Fenton Malling Treating Provider/Extender: Melburn Hake, Jola Critzer Weeks in Treatment: 10 Verbal / Phone Orders: No Diagnosis Coding ICD-10 Coding Code Description [phone] Laceration without foreign body, left lower leg, sequela S80.212S Abrasion, left knee, sequela S80.211S Abrasion, right knee, sequela L97.822 Non-pressure chronic ulcer of other part of left lower leg with fat layer exposed Wound Cleansing Wound #3 Left,Lateral Lower Leg o Clean wound with Normal Saline. Anesthetic (add  to Medication List) Wound #3 Left,Lateral Lower Leg o Topical Lidocaine 4% cream applied to wound bed prior to debridement (In Clinic Only). Primary Wound Dressing Wound #3 Left,Lateral Lower Leg o Hydrafera Blue Ready Transfer Secondary Dressing Wound #3 Left,Lateral Lower Leg o ABD and Kerlix/Conform - ace wrap Dressing Change Frequency Wound #3 Left,Lateral Lower Leg o Three times weekly - [facility] Tuesday and Saturdays dressing changes. Patient comes to clinic on Thursdays. Follow-up Appointments Wound #3 Left,Lateral Lower Leg o Return Appointment in 2 weeks. Edema Control Wound #3 Left,Lateral Lower Leg o Elevate legs to the level of the heart and pump ankles as often as possible o Other: - ACE wrap Additional Orders / Instructions Wound #3 Left,Lateral Lower Leg o Vitamin A; Vitamin C, Zinc - Please add a multivitamin that has 100% of vitamin A, vitamin C and zinc supplements in it [name], [name]. ([id number]) o Increase protein intake. o Activity as tolerated Home Health Wound #3 Left,Lateral Lower Leg o [place] Nurse may visit PRN to address patientos wound care needs. o FACE TO FACE ENCOUNTER: MEDICARE and MEDICAID PATIENTS: I certify that this patient is under my care and that I had a face-to-face encounter that meets the physician face-to-face encounter requirements with this patient on this date. The encounter with the patient was in whole or in part for the following MEDICAL CONDITION: (primary reason for [place]) MEDICAL NECESSITY: I certify, that based on my findings, NURSING services are a medically necessary home health service. HOME BOUND STATUS: I certify that my clinical findings support that this patient is homebound (i.e., Due to illness or injury, pt requires aid of supportive devices such as crutches, cane, wheelchairs, walkers, the use of special transportation or the assistance of  another person to leave their place of residence. There is a normal inability to leave the home and doing so requires considerable and taxing effort. Other absences are for medical reasons / religious services and are infrequent or of short duration when for other reasons). o If current dressing causes regression in wound condition, may D/C ordered dressing product/s and apply Normal Saline Moist Dressing daily until next [place] / Other MD appointment. [place] of regression  in wound condition at [phone]. o Please direct any NON-WOUND related issues/requests for orders to patient's Primary Care Physician Electronic Signature(s) Signed: 07/02/2018 5:20:35 PM By: [name] PA-C Signed: 07/03/2018 5:38:25 PM By: Montey Hora Entered By: Montey Hora on 07/02/2018 10:41:28 [name], [name] ([id number]) -------------------------------------------------------------------------------- Problem List Details Patient Name: [name], [name]. Date of Service: 07/02/2018 10:15 AM Medical Record Number: [id number] Patient Account Number: [id number] Date of Birth/Sex: 05/15/1936 (82 y.o. 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 Date of Service: 07/02/2018 10:15 AM Medical Record Number: [id number] Patient Account Number: [id number] Date of Birth/Sex: September 02, 1936 (82 y.o. F) Treating RN: Montey Hora Primary Care Provider: Fenton Malling Other Clinician: Referring Provider: Fenton Malling Treating Provider/Extender: Melburn Hake, Bernis Stecher Weeks in Treatment: 10 Subjective Chief Complaint Information obtained from Patient LLE Ulcers History of Present Illness (HPI) 04/23/18-She is seen in initial evaluation for multiple skin tears s/p fall on 8/19. She did sustain a closed fracture of the right patella secondary to that fall. She presented to emergency department on 8/19 for treatment. The left leg laceration was unable to be sutured closed, Steri-Strips were applied and she was treated with knee immobilizer for the patella fracture. She continues on Keflex that was initiated on 8/19. The left leg skin tear flap was cleansed and reapproximated, secure with steri strips. She will follow up next week 04/30/18-She is seen in follow-up evaluation for multiple skin tears. She continues with knee immobilizers to the right lower extremity, has developed lower extremity edema with subsequent weeping; will initiate ace wrap compression. The flap is mostly devitalized, which was somewhat expected; we will change treatment plan and she will follow up next week 05/07/18-She is seen in follow-up evaluation for multiple wounds. The left lateral lower leg skin flap is nonviable/devitalized, now eschar covered. She tolerated debridement fairly well. The right lower extremity skin tear that was new last week has essentially healed. She has responded nicely to Ace wrap compression. We will modify treatment plan and she will follow-up next week 05/14/18 on evaluation today patient  actually appears to be doing a little better in regard to her left lower extremity ulcers. With that being said she has been tolerating the dressing changes without complication. Medihoney is what she was switch to dear in the last week's visit. Prior to that we used Iodoflex when she had the eschar present. Nonetheless I believe that the Iodoflex may be the best thing for her at this point in regard to the current wound bed and prevention of biofilm buildup. Nonetheless she seems to be tolerating the dressing changes without complication which is good news. It's really the debridement the calls are the most pain last week she did allow for debridement this week she also agreed to allow me to attempt debridement in order to try to help this wound to heal more  without difficulty. Psychiatric this patient is able to make decisions and demonstrates good insight into disease process. Alert and Oriented x 3. pleasant and cooperative. Notes Patient's wound bed currently shows evidence of good granulation at this point in time. She does have some slight Slough/biofilm on the surface of the wound which I did actually sharply debrided away today. Post debridement the wound bed appears to be doing better. The entire surface was cleansed. Electronic Signature(s) Signed: 07/02/2018 5:20:35 PM By: [name] PA-C Entered By: [name] on 07/02/2018 11:08:32 [name], [name] ([id number]) -------------------------------------------------------------------------------- Physician Orders Details Patient Name: [name], [name]. Date of Service: 07/02/2018 10:15 AM Medical Record Number: [id number] Patient Account Number: [id number] Date of Birth/Sex: Sep 19, 1935 (82 y.o. F) Treating RN: Montey Hora Primary Care Provider: Fenton Malling Other Clinician: Referring Provider: Fenton Malling Treating Provider/Extender: Melburn Hake, Jola Critzer Weeks in Treatment: 10 Verbal / Phone Orders: No Diagnosis Coding ICD-10 Coding Code Description [phone] Laceration without foreign body, left lower leg, sequela S80.212S Abrasion, left knee, sequela S80.211S Abrasion, right knee, sequela L97.822 Non-pressure chronic ulcer of other part of left lower leg with fat layer exposed Wound Cleansing Wound #3 Left,Lateral Lower Leg o Clean wound with Normal Saline. Anesthetic (add  to Medication List) Wound #3 Left,Lateral Lower Leg o Topical Lidocaine 4% cream applied to wound bed prior to debridement (In Clinic Only). Primary Wound Dressing Wound #3 Left,Lateral Lower Leg o Hydrafera Blue Ready Transfer Secondary Dressing Wound #3 Left,Lateral Lower Leg o ABD and Kerlix/Conform - ace wrap Dressing Change Frequency Wound #3 Left,Lateral Lower Leg o Three times weekly - [facility] Tuesday and Saturdays dressing changes. Patient comes to clinic on Thursdays. Follow-up Appointments Wound #3 Left,Lateral Lower Leg o Return Appointment in 2 weeks. Edema Control Wound #3 Left,Lateral Lower Leg o Elevate legs to the level of the heart and pump ankles as often as possible o Other: - ACE wrap Additional Orders / Instructions Wound #3 Left,Lateral Lower Leg o Vitamin A; Vitamin C, Zinc - Please add a multivitamin that has 100% of vitamin A, vitamin C and zinc supplements in it [name], [name]. ([id number]) o Increase protein intake. o Activity as tolerated Home Health Wound #3 Left,Lateral Lower Leg o [place] Nurse may visit PRN to address patientos wound care needs. o FACE TO FACE ENCOUNTER: MEDICARE and MEDICAID PATIENTS: I certify that this patient is under my care and that I had a face-to-face encounter that meets the physician face-to-face encounter requirements with this patient on this date. The encounter with the patient was in whole or in part for the following MEDICAL CONDITION: (primary reason for [place]) MEDICAL NECESSITY: I certify, that based on my findings, NURSING services are a medically necessary home health service. HOME BOUND STATUS: I certify that my clinical findings support that this patient is homebound (i.e., Due to illness or injury, pt requires aid of supportive devices such as crutches, cane, wheelchairs, walkers, the use of special transportation or the assistance of  another person to leave their place of residence. There is a normal inability to leave the home and doing so requires considerable and taxing effort. Other absences are for medical reasons / religious services and are infrequent or of short duration when for other reasons). o If current dressing causes regression in wound condition, may D/C ordered dressing product/s and apply Normal Saline Moist Dressing daily until next [place] / Other MD appointment. [place] of regression  without difficulty. Psychiatric this patient is able to make decisions and demonstrates good insight into disease process. Alert and Oriented x 3. pleasant and cooperative. Notes Patient's wound bed currently shows evidence of good granulation at this point in time. She does have some slight Slough/biofilm on the surface of the wound which I did actually sharply debrided away today. Post debridement the wound bed appears to be doing better. The entire surface was cleansed. Electronic Signature(s) Signed: 07/02/2018 5:20:35 PM By: [name] PA-C Entered By: [name] on 07/02/2018 11:08:32 [name], [name] ([id number]) -------------------------------------------------------------------------------- Physician Orders Details Patient Name: [name], [name]. Date of Service: 07/02/2018 10:15 AM Medical Record Number: [id number] Patient Account Number: [id number] Date of Birth/Sex: Sep 19, 1935 (82 y.o. F) Treating RN: Montey Hora Primary Care Provider: Fenton Malling Other Clinician: Referring Provider: Fenton Malling Treating Provider/Extender: Melburn Hake, Jola Critzer Weeks in Treatment: 10 Verbal / Phone Orders: No Diagnosis Coding ICD-10 Coding Code Description [phone] Laceration without foreign body, left lower leg, sequela S80.212S Abrasion, left knee, sequela S80.211S Abrasion, right knee, sequela L97.822 Non-pressure chronic ulcer of other part of left lower leg with fat layer exposed Wound Cleansing Wound #3 Left,Lateral Lower Leg o Clean wound with Normal Saline. Anesthetic (add  to Medication List) Wound #3 Left,Lateral Lower Leg o Topical Lidocaine 4% cream applied to wound bed prior to debridement (In Clinic Only). Primary Wound Dressing Wound #3 Left,Lateral Lower Leg o Hydrafera Blue Ready Transfer Secondary Dressing Wound #3 Left,Lateral Lower Leg o ABD and Kerlix/Conform - ace wrap Dressing Change Frequency Wound #3 Left,Lateral Lower Leg o Three times weekly - [facility] Tuesday and Saturdays dressing changes. Patient comes to clinic on Thursdays. Follow-up Appointments Wound #3 Left,Lateral Lower Leg o Return Appointment in 2 weeks. Edema Control Wound #3 Left,Lateral Lower Leg o Elevate legs to the level of the heart and pump ankles as often as possible o Other: - ACE wrap Additional Orders / Instructions Wound #3 Left,Lateral Lower Leg o Vitamin A; Vitamin C, Zinc - Please add a multivitamin that has 100% of vitamin A, vitamin C and zinc supplements in it [name], [name]. ([id number]) o Increase protein intake. o Activity as tolerated Home Health Wound #3 Left,Lateral Lower Leg o [place] Nurse may visit PRN to address patientos wound care needs. o FACE TO FACE ENCOUNTER: MEDICARE and MEDICAID PATIENTS: I certify that this patient is under my care and that I had a face-to-face encounter that meets the physician face-to-face encounter requirements with this patient on this date. The encounter with the patient was in whole or in part for the following MEDICAL CONDITION: (primary reason for [place]) MEDICAL NECESSITY: I certify, that based on my findings, NURSING services are a medically necessary home health service. HOME BOUND STATUS: I certify that my clinical findings support that this patient is homebound (i.e., Due to illness or injury, pt requires aid of supportive devices such as crutches, cane, wheelchairs, walkers, the use of special transportation or the assistance of  another person to leave their place of residence. There is a normal inability to leave the home and doing so requires considerable and taxing effort. Other absences are for medical reasons / religious services and are infrequent or of short duration when for other reasons). o If current dressing causes regression in wound condition, may D/C ordered dressing product/s and apply Normal Saline Moist Dressing daily until next [place] / Other MD appointment. [place] of regression  in wound condition at [phone]. o Please direct any NON-WOUND related issues/requests for orders to patient's Primary Care Physician Electronic Signature(s) Signed: 07/02/2018 5:20:35 PM By: [name] PA-C Signed: 07/03/2018 5:38:25 PM By: Montey Hora Entered By: Montey Hora on 07/02/2018 10:41:28 [name], [name] ([id number]) -------------------------------------------------------------------------------- Problem List Details Patient Name: [name], [name]. Date of Service: 07/02/2018 10:15 AM Medical Record Number: [id number] Patient Account Number: [id number] Date of Birth/Sex: 05/15/1936 (82 y.o. 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 Date of Service: 07/02/2018 10:15 AM Medical Record Number: [id number] Patient Account Number: [id number] Date of Birth/Sex: September 02, 1936 (82 y.o. F) Treating RN: Montey Hora Primary Care Provider: Fenton Malling Other Clinician: Referring Provider: Fenton Malling Treating Provider/Extender: Melburn Hake, Bernis Stecher Weeks in Treatment: 10 Subjective Chief Complaint Information obtained from Patient LLE Ulcers History of Present Illness (HPI) 04/23/18-She is seen in initial evaluation for multiple skin tears s/p fall on 8/19. She did sustain a closed fracture of the right patella secondary to that fall. She presented to emergency department on 8/19 for treatment. The left leg laceration was unable to be sutured closed, Steri-Strips were applied and she was treated with knee immobilizer for the patella fracture. She continues on Keflex that was initiated on 8/19. The left leg skin tear flap was cleansed and reapproximated, secure with steri strips. She will follow up next week 04/30/18-She is seen in follow-up evaluation for multiple skin tears. She continues with knee immobilizers to the right lower extremity, has developed lower extremity edema with subsequent weeping; will initiate ace wrap compression. The flap is mostly devitalized, which was somewhat expected; we will change treatment plan and she will follow up next week 05/07/18-She is seen in follow-up evaluation for multiple wounds. The left lateral lower leg skin flap is nonviable/devitalized, now eschar covered. She tolerated debridement fairly well. The right lower extremity skin tear that was new last week has essentially healed. She has responded nicely to Ace wrap compression. We will modify treatment plan and she will follow-up next week 05/14/18 on evaluation today patient  actually appears to be doing a little better in regard to her left lower extremity ulcers. With that being said she has been tolerating the dressing changes without complication. Medihoney is what she was switch to dear in the last week's visit. Prior to that we used Iodoflex when she had the eschar present. Nonetheless I believe that the Iodoflex may be the best thing for her at this point in regard to the current wound bed and prevention of biofilm buildup. Nonetheless she seems to be tolerating the dressing changes without complication which is good news. It's really the debridement the calls are the most pain last week she did allow for debridement this week she also agreed to allow me to attempt debridement in order to try to help this wound to heal more  in wound condition at [phone]. o Please direct any NON-WOUND related issues/requests for orders to patient's Primary Care Physician Electronic Signature(s) Signed: 07/02/2018 5:20:35 PM By: [name] PA-C Signed: 07/03/2018 5:38:25 PM By: Montey Hora Entered By: Montey Hora on 07/02/2018 10:41:28 [name], [name] ([id number]) -------------------------------------------------------------------------------- Problem List Details Patient Name: [name], [name]. Date of Service: 07/02/2018 10:15 AM Medical Record Number: [id number] Patient Account Number: [id number] Date of Birth/Sex: 05/15/1936 (82 y.o. 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 Date of Service: 07/02/2018 10:15 AM Medical Record Number: [id number] Patient Account Number: [id number] Date of Birth/Sex: September 02, 1936 (82 y.o. F) Treating RN: Montey Hora Primary Care Provider: Fenton Malling Other Clinician: Referring Provider: Fenton Malling Treating Provider/Extender: Melburn Hake, Bernis Stecher Weeks in Treatment: 10 Subjective Chief Complaint Information obtained from Patient LLE Ulcers History of Present Illness (HPI) 04/23/18-She is seen in initial evaluation for multiple skin tears s/p fall on 8/19. She did sustain a closed fracture of the right patella secondary to that fall. She presented to emergency department on 8/19 for treatment. The left leg laceration was unable to be sutured closed, Steri-Strips were applied and she was treated with knee immobilizer for the patella fracture. She continues on Keflex that was initiated on 8/19. The left leg skin tear flap was cleansed and reapproximated, secure with steri strips. She will follow up next week 04/30/18-She is seen in follow-up evaluation for multiple skin tears. She continues with knee immobilizers to the right lower extremity, has developed lower extremity edema with subsequent weeping; will initiate ace wrap compression. The flap is mostly devitalized, which was somewhat expected; we will change treatment plan and she will follow up next week 05/07/18-She is seen in follow-up evaluation for multiple wounds. The left lateral lower leg skin flap is nonviable/devitalized, now eschar covered. She tolerated debridement fairly well. The right lower extremity skin tear that was new last week has essentially healed. She has responded nicely to Ace wrap compression. We will modify treatment plan and she will follow-up next week 05/14/18 on evaluation today patient  actually appears to be doing a little better in regard to her left lower extremity ulcers. With that being said she has been tolerating the dressing changes without complication. Medihoney is what she was switch to dear in the last week's visit. Prior to that we used Iodoflex when she had the eschar present. Nonetheless I believe that the Iodoflex may be the best thing for her at this point in regard to the current wound bed and prevention of biofilm buildup. Nonetheless she seems to be tolerating the dressing changes without complication which is good news. It's really the debridement the calls are the most pain last week she did allow for debridement this week she also agreed to allow me to attempt debridement in order to try to help this wound to heal more

## 2018-07-06 DIAGNOSIS — Z4889 Encounter for other specified surgical aftercare: Secondary | ICD-10-CM | POA: Diagnosis not present

## 2018-07-07 DIAGNOSIS — I10 Essential (primary) hypertension: Secondary | ICD-10-CM | POA: Diagnosis not present

## 2018-07-07 DIAGNOSIS — J84112 Idiopathic pulmonary fibrosis: Secondary | ICD-10-CM | POA: Diagnosis not present

## 2018-07-07 DIAGNOSIS — M150 Primary generalized (osteo)arthritis: Secondary | ICD-10-CM | POA: Diagnosis not present

## 2018-07-07 DIAGNOSIS — M4856XS Collapsed vertebra, not elsewhere classified, lumbar region, sequela of fracture: Secondary | ICD-10-CM | POA: Diagnosis not present

## 2018-07-07 DIAGNOSIS — S81812D Laceration without foreign body, left lower leg, subsequent encounter: Secondary | ICD-10-CM | POA: Diagnosis not present

## 2018-07-07 DIAGNOSIS — Z9181 History of falling: Secondary | ICD-10-CM | POA: Diagnosis not present

## 2018-07-08 DIAGNOSIS — M5416 Radiculopathy, lumbar region: Secondary | ICD-10-CM | POA: Diagnosis not present

## 2018-07-08 DIAGNOSIS — M25519 Pain in unspecified shoulder: Secondary | ICD-10-CM | POA: Diagnosis not present

## 2018-07-11 DIAGNOSIS — I10 Essential (primary) hypertension: Secondary | ICD-10-CM | POA: Diagnosis not present

## 2018-07-11 DIAGNOSIS — Z9181 History of falling: Secondary | ICD-10-CM | POA: Diagnosis not present

## 2018-07-11 DIAGNOSIS — J84112 Idiopathic pulmonary fibrosis: Secondary | ICD-10-CM | POA: Diagnosis not present

## 2018-07-11 DIAGNOSIS — M4856XS Collapsed vertebra, not elsewhere classified, lumbar region, sequela of fracture: Secondary | ICD-10-CM | POA: Diagnosis not present

## 2018-07-11 DIAGNOSIS — M150 Primary generalized (osteo)arthritis: Secondary | ICD-10-CM | POA: Diagnosis not present

## 2018-07-11 DIAGNOSIS — S81812D Laceration without foreign body, left lower leg, subsequent encounter: Secondary | ICD-10-CM | POA: Diagnosis not present

## 2018-07-14 DIAGNOSIS — S81812D Laceration without foreign body, left lower leg, subsequent encounter: Secondary | ICD-10-CM | POA: Diagnosis not present

## 2018-07-14 DIAGNOSIS — M4856XS Collapsed vertebra, not elsewhere classified, lumbar region, sequela of fracture: Secondary | ICD-10-CM | POA: Diagnosis not present

## 2018-07-14 DIAGNOSIS — Z9181 History of falling: Secondary | ICD-10-CM | POA: Diagnosis not present

## 2018-07-14 DIAGNOSIS — I10 Essential (primary) hypertension: Secondary | ICD-10-CM | POA: Diagnosis not present

## 2018-07-14 DIAGNOSIS — J84112 Idiopathic pulmonary fibrosis: Secondary | ICD-10-CM | POA: Diagnosis not present

## 2018-07-14 DIAGNOSIS — M150 Primary generalized (osteo)arthritis: Secondary | ICD-10-CM | POA: Diagnosis not present

## 2018-07-15 DIAGNOSIS — M47817 Spondylosis without myelopathy or radiculopathy, lumbosacral region: Secondary | ICD-10-CM | POA: Diagnosis not present

## 2018-07-15 DIAGNOSIS — M25562 Pain in left knee: Secondary | ICD-10-CM | POA: Diagnosis not present

## 2018-07-15 DIAGNOSIS — M25561 Pain in right knee: Secondary | ICD-10-CM | POA: Diagnosis not present

## 2018-07-15 DIAGNOSIS — S82034D Nondisplaced transverse fracture of right patella, subsequent encounter for closed fracture with routine healing: Secondary | ICD-10-CM | POA: Diagnosis not present

## 2018-07-15 DIAGNOSIS — M6281 Muscle weakness (generalized): Secondary | ICD-10-CM | POA: Diagnosis not present

## 2018-07-16 ENCOUNTER — Encounter: Payer: Medicare Other | Attending: Physician Assistant | Admitting: Physician Assistant

## 2018-07-16 DIAGNOSIS — S80212S Abrasion, left knee, sequela: Secondary | ICD-10-CM | POA: Diagnosis not present

## 2018-07-16 DIAGNOSIS — Z79899 Other long term (current) drug therapy: Secondary | ICD-10-CM | POA: Diagnosis not present

## 2018-07-16 DIAGNOSIS — L97822 Non-pressure chronic ulcer of other part of left lower leg with fat layer exposed: Secondary | ICD-10-CM | POA: Insufficient documentation

## 2018-07-16 DIAGNOSIS — S81812A Laceration without foreign body, left lower leg, initial encounter: Secondary | ICD-10-CM | POA: Diagnosis not present

## 2018-07-16 DIAGNOSIS — I10 Essential (primary) hypertension: Secondary | ICD-10-CM | POA: Diagnosis not present

## 2018-07-16 DIAGNOSIS — X58XXXA Exposure to other specified factors, initial encounter: Secondary | ICD-10-CM | POA: Insufficient documentation

## 2018-07-18 DIAGNOSIS — M4856XS Collapsed vertebra, not elsewhere classified, lumbar region, sequela of fracture: Secondary | ICD-10-CM | POA: Diagnosis not present

## 2018-07-18 DIAGNOSIS — Z9181 History of falling: Secondary | ICD-10-CM | POA: Diagnosis not present

## 2018-07-18 DIAGNOSIS — J84112 Idiopathic pulmonary fibrosis: Secondary | ICD-10-CM | POA: Diagnosis not present

## 2018-07-18 DIAGNOSIS — S81812D Laceration without foreign body, left lower leg, subsequent encounter: Secondary | ICD-10-CM | POA: Diagnosis not present

## 2018-07-18 DIAGNOSIS — I10 Essential (primary) hypertension: Secondary | ICD-10-CM | POA: Diagnosis not present

## 2018-07-18 DIAGNOSIS — M150 Primary generalized (osteo)arthritis: Secondary | ICD-10-CM | POA: Diagnosis not present

## 2018-07-18 NOTE — Progress Notes (Signed)
Terri, [name] ([id number]) Visit Report for 07/16/2018 Arrival Information Details Patient Name: [name], [name]. Date of Service: 07/16/2018 10:15 AM Medical Record Number: [id number] Patient Account Number: [id number] Date of Birth/Sex: [birth year] (82 y.o. F) Treating RN: Montey Hora Primary Care Daelan Gatt: Fenton Malling Other Clinician: Referring Catalino Plascencia: Fenton Malling Treating Jamaal Bernasconi/Extender: Melburn Hake, HOYT Weeks in Treatment: 12 Visit Information History Since Last Visit Added or deleted any medications: No Patient Arrived: Ambulatory Any new allergies or adverse reactions: No Arrival Time: 10:24 Had a fall or experienced change in No Accompanied By: self activities of daily living that may affect Transfer Assistance: None risk of falls: Patient Identification Verified: Yes Signs or symptoms of abuse/neglect since last visito No Secondary Verification Process Completed: Yes Hospitalized since last visit: No Implantable device outside of the clinic excluding No cellular tissue based products placed in the center since last visit: Has Dressing in Place as Prescribed: Yes Pain Present Now: No Electronic Signature(s) Signed: 07/16/2018 2:48:14 PM By: Lorine Bears RCP, RRT, CHT Entered By: Lorine Bears on 07/16/2018 10:26:32 [name] ([id number]) -------------------------------------------------------------------------------- Encounter Discharge Information Details Patient Name: Terri, [name]. Date of Service: 07/16/2018 10:15 AM Medical Record Number: [id number] Patient Account Number: [id number] Date of Birth/Sex: Dec 17, 1935 (82 y.o. F) Treating RN: Montey Hora Primary Care Berma Harts: Fenton Malling Other Clinician: Referring Mister Krahenbuhl: Fenton Malling Treating Onda Kattner/Extender: Melburn Hake, HOYT Weeks in Treatment: 12 Encounter Discharge Information Items Post Procedure Vitals Discharge  Condition: Stable Temperature (F): 97.8 Ambulatory Status: Ambulatory Pulse (bpm): 55 Discharge Destination: Home Respiratory Rate (breaths/min): 18 Transportation: Private Auto Blood Pressure (mmHg): 152/60 Accompanied By: self Schedule Follow-up Appointment: Yes Clinical Summary of Care: Electronic Signature(s) Signed: 07/16/2018 12:35:23 PM By: Montey Hora Entered By: Montey Hora on 07/16/2018 12:35:23 [name] ([id number]) -------------------------------------------------------------------------------- Lower Extremity Assessment Details Patient Name: Terri, [name]. Date of Service: 07/16/2018 10:15 AM Medical Record Number: [id number] Patient Account Number: [id number] Date of Birth/Sex: [birth year] (82 y.o. F) Treating RN: Secundino Ginger Primary Care Bogdan Vivona: Fenton Malling Other Clinician: Referring Rhia Blatchford: Fenton Malling Treating Shuntae Herzig/Extender: Melburn Hake, HOYT Weeks in Treatment: 12 Edema Assessment Assessed: [Left: No] [Right: No] Edema: [Left: N] [Right: o] Calf Left: Right: Point of Measurement: 30 cm From Medial Instep 29.5 cm cm Ankle Left: Right: Point of Measurement: 10 cm From Medial Instep 18.5 cm cm Vascular Assessment Claudication: Claudication Assessment [Left:None] Pulses: Dorsalis Pedis Palpable: [Left:Yes] Posterior Tibial Extremity colors, hair growth, and conditions: Extremity Color: [Left:Normal] Hair Growth on Extremity: [Left:No] Temperature of Extremity: [Left:Warm] Capillary Refill: [Left:< 3 seconds] Toe Nail Assessment Left: Right: Thick: No Discolored: No Deformed: No Improper Length and Hygiene: No Electronic Signature(s) Signed: 07/16/2018 4:20:30 PM By: Secundino Ginger Entered By: Secundino Ginger on 07/16/2018 10:46:50 Watkinson, [name] ([id number]) -------------------------------------------------------------------------------- Multi Wound Chart Details Patient Name: [name]. Date of Service:  07/16/2018 10:15 AM Medical Record Number: [id number] Patient Account Number: [id number] Date of Birth/Sex: December 06, 1935 (82 y.o. F) Treating RN: Montey Hora Primary Care Kasondra Junod: Fenton Malling Other Clinician: Referring Tevin Shillingford: Fenton Malling Treating Stehanie Ekstrom/Extender: Melburn Hake, HOYT Weeks in Treatment: 12 Vital Signs Height(in): 60 Pulse(bpm): 55 Weight(lbs): 151 Blood Pressure(mmHg): 152/60 Body Mass Index(BMI): 29 Temperature(F): 97.8 Respiratory Rate 16 (breaths/min): Photos: [3:No Photos] [N/A:N/A] Wound Location: [3:Left Lower Leg - Lateral] [N/A:N/A] Wounding Event: [3:Trauma] [N/A:N/A] Primary Etiology: [3:Skin Tear] [N/A:N/A] Comorbid History: [3:Hypertension, Osteoarthritis] [N/A:N/A] Date Acquired: [3:04/20/2018] [N/A:N/A] Weeks of Treatment: [3:12] [N/A:N/A] Wound Status: [3:Open] [N/A:N/A] Measurements L x W x D [3:1.7x2.7x0.1] [N/A:N/A] (cm)  Primary Care  Physician: Fenton Malling Other Clinician: Referring Physician: Fenton Malling Treating Physician/Extender: Sharalyn Ink in Treatment: 12 Education Assessment Education Provided To: Patient Education Topics Provided Wound/Skin Impairment: Handouts: Other: wound care to continus as ordered Methods: Demonstration, Explain/Verbal Responses: State content correctly Electronic Signature(s) Signed: 07/16/2018 5:19:23 PM By: Montey Hora Entered By: Montey Hora on 07/16/2018 12:35:45 Castillo, [name] ([id number]) -------------------------------------------------------------------------------- Wound Assessment Details Patient Name: [name], [name]. Date of Service: 07/16/2018 10:15 AM Medical Record Number: [id number] Patient Account Number: [id number] Date of Birth/Sex: Nov 25, 1935 (82 y.o. F) Treating RN: Secundino Ginger Primary Care Ravon Mortellaro: Fenton Malling Other Clinician: Referring Aamiyah Derrick: Fenton Malling Treating Reha Martinovich/Extender: Melburn Hake, HOYT Weeks in Treatment: 12 Wound Status Wound Number: 3 Primary Etiology: Skin Tear Wound Location: Left Lower Leg - Lateral Wound Status: Open Wounding Event: Trauma Comorbid History: Hypertension, Osteoarthritis Date Acquired: 04/20/2018 Weeks Of Treatment: 12 Clustered Wound: No Photos Photo Uploaded By: Secundino Ginger on 07/16/2018 13:32:25 Wound Measurements Length: (cm) 1.7 Width: (cm) 2.7 Depth: (cm) 0.1 Area: (cm) 3.605 Volume: (cm) 0.36 % Reduction in Area: 96.8% % Reduction in Volume: 98.4% Epithelialization: Small (1-33%) Tunneling: No Undermining: No Wound Description Full Thickness Without Exposed Support Foul Odo Classification: Structures Slough/F Wound Margin: Flat and Intact Exudate Small Amount: Exudate Type: Serosanguineous Exudate Color: red, brown r After Cleansing: No ibrino Yes Wound Bed Granulation Amount: Medium (34-66%) Exposed Structure Granulation Quality: Red,  Hyper-granulation Fascia Exposed: No Necrotic Amount: Medium (34-66%) Fat Layer (Subcutaneous Tissue) Exposed: Yes Necrotic Quality: Eschar, Adherent Slough Tendon Exposed: No Muscle Exposed: No Joint Exposed: No Bone Exposed: No Vanderberg, Peggy J. ([id number]) Periwound Skin Texture Texture Color No Abnormalities Noted: No No Abnormalities Noted: No Callus: No Atrophie Blanche: No Crepitus: No Cyanosis: No Excoriation: No Ecchymosis: No Induration: No Erythema: No Rash: No Hemosiderin Staining: No Scarring: No Mottled: No Pallor: No Moisture Rubor: No No Abnormalities Noted: No Dry / Scaly: No Temperature / Pain Maceration: No Temperature: No Abnormality Tenderness on Palpation: Yes Wound Preparation Ulcer Cleansing: Rinsed/Irrigated with Saline Topical Anesthetic Applied: Other: lidocaine 4%, Treatment Notes Wound #3 (Left, Lateral Lower Leg) Notes Hydrafera Blue, abd, conform secured with ace wrap Electronic Signature(s) Signed: 07/16/2018 4:20:30 PM By: Secundino Ginger Entered By: Secundino Ginger on 07/16/2018 10:45:12 Mcalexander, [name] ([id number]) -------------------------------------------------------------------------------- Vitals Details Patient Name: JENASIA, DOLINAR. Date of Service: 07/16/2018 10:15 AM Medical Record Number: [id number] Patient Account Number: [id number] Date of Birth/Sex: 04-19-1936 (82 y.o. F) Treating RN: Montey Hora Primary Care Desirie Minteer: Fenton Malling Other Clinician: Referring Theresa Dohrman: Fenton Malling Treating Mylee Falin/Extender: Melburn Hake, HOYT Weeks in Treatment: 12 Vital Signs Time Taken: 10:23 Temperature (F): 97.8 Height (in): 60 Pulse (bpm): 55 Weight (lbs): 151 Respiratory Rate (breaths/min): 16 Body Mass Index (BMI): 29.5 Blood Pressure (mmHg): 152/60 Reference Range: 80 - 120 mg / dl Electronic Signature(s) Signed: 07/16/2018 2:48:14 PM By: Lorine Bears RCP, RRT, CHT Entered By: Becky Sax, Amado Nash on 07/16/2018 10:29:34  Terri, [name] ([id number]) Visit Report for 07/16/2018 Arrival Information Details Patient Name: [name], [name]. Date of Service: 07/16/2018 10:15 AM Medical Record Number: [id number] Patient Account Number: [id number] Date of Birth/Sex: [birth year] (82 y.o. F) Treating RN: Montey Hora Primary Care Daelan Gatt: Fenton Malling Other Clinician: Referring Catalino Plascencia: Fenton Malling Treating Jamaal Bernasconi/Extender: Melburn Hake, HOYT Weeks in Treatment: 12 Visit Information History Since Last Visit Added or deleted any medications: No Patient Arrived: Ambulatory Any new allergies or adverse reactions: No Arrival Time: 10:24 Had a fall or experienced change in No Accompanied By: self activities of daily living that may affect Transfer Assistance: None risk of falls: Patient Identification Verified: Yes Signs or symptoms of abuse/neglect since last visito No Secondary Verification Process Completed: Yes Hospitalized since last visit: No Implantable device outside of the clinic excluding No cellular tissue based products placed in the center since last visit: Has Dressing in Place as Prescribed: Yes Pain Present Now: No Electronic Signature(s) Signed: 07/16/2018 2:48:14 PM By: Lorine Bears RCP, RRT, CHT Entered By: Lorine Bears on 07/16/2018 10:26:32 [name] ([id number]) -------------------------------------------------------------------------------- Encounter Discharge Information Details Patient Name: Terri, [name]. Date of Service: 07/16/2018 10:15 AM Medical Record Number: [id number] Patient Account Number: [id number] Date of Birth/Sex: Dec 17, 1935 (82 y.o. F) Treating RN: Montey Hora Primary Care Berma Harts: Fenton Malling Other Clinician: Referring Mister Krahenbuhl: Fenton Malling Treating Onda Kattner/Extender: Melburn Hake, HOYT Weeks in Treatment: 12 Encounter Discharge Information Items Post Procedure Vitals Discharge  Condition: Stable Temperature (F): 97.8 Ambulatory Status: Ambulatory Pulse (bpm): 55 Discharge Destination: Home Respiratory Rate (breaths/min): 18 Transportation: Private Auto Blood Pressure (mmHg): 152/60 Accompanied By: self Schedule Follow-up Appointment: Yes Clinical Summary of Care: Electronic Signature(s) Signed: 07/16/2018 12:35:23 PM By: Montey Hora Entered By: Montey Hora on 07/16/2018 12:35:23 [name] ([id number]) -------------------------------------------------------------------------------- Lower Extremity Assessment Details Patient Name: Terri, [name]. Date of Service: 07/16/2018 10:15 AM Medical Record Number: [id number] Patient Account Number: [id number] Date of Birth/Sex: [birth year] (82 y.o. F) Treating RN: Secundino Ginger Primary Care Bogdan Vivona: Fenton Malling Other Clinician: Referring Rhia Blatchford: Fenton Malling Treating Shuntae Herzig/Extender: Melburn Hake, HOYT Weeks in Treatment: 12 Edema Assessment Assessed: [Left: No] [Right: No] Edema: [Left: N] [Right: o] Calf Left: Right: Point of Measurement: 30 cm From Medial Instep 29.5 cm cm Ankle Left: Right: Point of Measurement: 10 cm From Medial Instep 18.5 cm cm Vascular Assessment Claudication: Claudication Assessment [Left:None] Pulses: Dorsalis Pedis Palpable: [Left:Yes] Posterior Tibial Extremity colors, hair growth, and conditions: Extremity Color: [Left:Normal] Hair Growth on Extremity: [Left:No] Temperature of Extremity: [Left:Warm] Capillary Refill: [Left:< 3 seconds] Toe Nail Assessment Left: Right: Thick: No Discolored: No Deformed: No Improper Length and Hygiene: No Electronic Signature(s) Signed: 07/16/2018 4:20:30 PM By: Secundino Ginger Entered By: Secundino Ginger on 07/16/2018 10:46:50 Watkinson, [name] ([id number]) -------------------------------------------------------------------------------- Multi Wound Chart Details Patient Name: [name]. Date of Service:  07/16/2018 10:15 AM Medical Record Number: [id number] Patient Account Number: [id number] Date of Birth/Sex: December 06, 1935 (82 y.o. F) Treating RN: Montey Hora Primary Care Kasondra Junod: Fenton Malling Other Clinician: Referring Tevin Shillingford: Fenton Malling Treating Stehanie Ekstrom/Extender: Melburn Hake, HOYT Weeks in Treatment: 12 Vital Signs Height(in): 60 Pulse(bpm): 55 Weight(lbs): 151 Blood Pressure(mmHg): 152/60 Body Mass Index(BMI): 29 Temperature(F): 97.8 Respiratory Rate 16 (breaths/min): Photos: [3:No Photos] [N/A:N/A] Wound Location: [3:Left Lower Leg - Lateral] [N/A:N/A] Wounding Event: [3:Trauma] [N/A:N/A] Primary Etiology: [3:Skin Tear] [N/A:N/A] Comorbid History: [3:Hypertension, Osteoarthritis] [N/A:N/A] Date Acquired: [3:04/20/2018] [N/A:N/A] Weeks of Treatment: [3:12] [N/A:N/A] Wound Status: [3:Open] [N/A:N/A] Measurements L x W x D [3:1.7x2.7x0.1] [N/A:N/A] (cm)

## 2018-07-18 NOTE — Progress Notes (Signed)
[id number] Patient Account Number: [id number] Date of Birth/Sex: 1936-08-03 (82 y.o. F) Treating RN: [name] Primary Care Provider: Fenton [name] Other Clinician: Referring Provider: Fenton [name] Treating Provider/Extender: [name], [name] in Treatment: 69 Constitutional Well-nourished and well-hydrated in no acute distress. Respiratory normal breathing without difficulty. Cardiovascular regular rate and rhythm with normal S1, S2. Psychiatric this patient is able to make decisions and demonstrates good insight into disease process. Alert and Oriented x 3. pleasant and cooperative. Notes Patient did require mild debridement today to remove some of the Slough/biofilm on the surface of the wound this was minimum. I believe it may be the last time that we have to perform any debridement at the dressings continue to do as well as they are currently. Post debridement the wound bed appears to be doing excellent. Electronic Signature(s) Signed: 07/16/2018 5:15:19 PM By: Terri Keeler PA-C Entered By: [name] on 07/16/2018 13:37:17 [name] ([id number]) -------------------------------------------------------------------------------- Physician Orders Details Patient Name: [name]. Date of Service: 07/16/2018 10:15 AM Medical Record Number: [id number] Patient Account Number: [id number] Date of Birth/Sex: [birth year] (82 y.o. F) Treating RN: [name] Primary Care Provider: Fenton [name] Other Clinician: Referring Provider: Fenton [name] Treating Provider/Extender: [name], Takisha Pelle [name] in Treatment: 1 Verbal / Phone Orders:  No Diagnosis Coding ICD-10 Coding Code Description [id number] Laceration without foreign body, left lower leg, sequela S80.212S Abrasion, left knee, sequela S80.211S Abrasion, right knee, sequela L97.822 Non-pressure chronic ulcer of other part of left lower leg with fat layer exposed Wound Cleansing Wound #3 Left,Lateral Lower Leg o Clean wound with Normal Saline. Anesthetic (add to Medication List) Wound #3 Left,Lateral Lower Leg o Topical Lidocaine 4% cream applied to wound bed prior to debridement (In Clinic Only). Primary Wound Dressing Wound #3 Left,Lateral Lower Leg o Hydrafera Blue Ready Transfer Secondary Dressing Wound #3 Left,Lateral Lower Leg o ABD and Kerlix/Conform - ace wrap Dressing Change Frequency Wound #3 Left,Lateral Lower Leg o Three times weekly - [facility] Tuesday and Saturdays dressing changes. Patient comes to clinic on Thursdays. Follow-up Appointments Wound #3 Left,Lateral Lower Leg o Return Appointment in 3 [name]. Edema Control Wound #3 Left,Lateral Lower Leg o Elevate legs to the level of the heart and pump ankles as often as possible o Other: - ACE wrap Additional Orders / Instructions Wound #3 Left,Lateral Lower Leg o Vitamin A; Vitamin C, Zinc - Please add a multivitamin that has 100% of vitamin A, vitamin C and zinc supplements in it [name], [name]. ([id number]) o Increase protein intake. o Activity as tolerated Home Health Wound #3 Left,Lateral Lower Leg o [place] Nurse may visit PRN to address patientos wound care needs. o FACE TO FACE ENCOUNTER: MEDICARE and MEDICAID PATIENTS: I certify that this patient is under my care and that I had a face-to-face encounter that meets the physician face-to-face encounter requirements with this patient on this date. The encounter with the patient was in whole or in part for the following MEDICAL CONDITION: (primary reason for [place])  MEDICAL NECESSITY: I certify, that based on my findings, NURSING services are a medically necessary home health service. HOME BOUND STATUS: I certify that my clinical findings support that this patient is homebound (i.e., Due to illness or injury, pt requires aid of supportive devices such as crutches, cane, wheelchairs, walkers, the use of special transportation or the assistance of another person to leave their place of residence. There is a normal inability to leave the  [id number] Patient Account Number: [id number] Date of Birth/Sex: 1936-08-03 (82 y.o. F) Treating RN: [name] Primary Care Provider: Fenton [name] Other Clinician: Referring Provider: Fenton [name] Treating Provider/Extender: [name], [name] in Treatment: 69 Constitutional Well-nourished and well-hydrated in no acute distress. Respiratory normal breathing without difficulty. Cardiovascular regular rate and rhythm with normal S1, S2. Psychiatric this patient is able to make decisions and demonstrates good insight into disease process. Alert and Oriented x 3. pleasant and cooperative. Notes Patient did require mild debridement today to remove some of the Slough/biofilm on the surface of the wound this was minimum. I believe it may be the last time that we have to perform any debridement at the dressings continue to do as well as they are currently. Post debridement the wound bed appears to be doing excellent. Electronic Signature(s) Signed: 07/16/2018 5:15:19 PM By: Terri Keeler PA-C Entered By: [name] on 07/16/2018 13:37:17 [name] ([id number]) -------------------------------------------------------------------------------- Physician Orders Details Patient Name: [name]. Date of Service: 07/16/2018 10:15 AM Medical Record Number: [id number] Patient Account Number: [id number] Date of Birth/Sex: [birth year] (82 y.o. F) Treating RN: [name] Primary Care Provider: Fenton [name] Other Clinician: Referring Provider: Fenton [name] Treating Provider/Extender: [name], Takisha Pelle [name] in Treatment: 1 Verbal / Phone Orders:  No Diagnosis Coding ICD-10 Coding Code Description [id number] Laceration without foreign body, left lower leg, sequela S80.212S Abrasion, left knee, sequela S80.211S Abrasion, right knee, sequela L97.822 Non-pressure chronic ulcer of other part of left lower leg with fat layer exposed Wound Cleansing Wound #3 Left,Lateral Lower Leg o Clean wound with Normal Saline. Anesthetic (add to Medication List) Wound #3 Left,Lateral Lower Leg o Topical Lidocaine 4% cream applied to wound bed prior to debridement (In Clinic Only). Primary Wound Dressing Wound #3 Left,Lateral Lower Leg o Hydrafera Blue Ready Transfer Secondary Dressing Wound #3 Left,Lateral Lower Leg o ABD and Kerlix/Conform - ace wrap Dressing Change Frequency Wound #3 Left,Lateral Lower Leg o Three times weekly - [facility] Tuesday and Saturdays dressing changes. Patient comes to clinic on Thursdays. Follow-up Appointments Wound #3 Left,Lateral Lower Leg o Return Appointment in 3 [name]. Edema Control Wound #3 Left,Lateral Lower Leg o Elevate legs to the level of the heart and pump ankles as often as possible o Other: - ACE wrap Additional Orders / Instructions Wound #3 Left,Lateral Lower Leg o Vitamin A; Vitamin C, Zinc - Please add a multivitamin that has 100% of vitamin A, vitamin C and zinc supplements in it [name], [name]. ([id number]) o Increase protein intake. o Activity as tolerated Home Health Wound #3 Left,Lateral Lower Leg o [place] Nurse may visit PRN to address patientos wound care needs. o FACE TO FACE ENCOUNTER: MEDICARE and MEDICAID PATIENTS: I certify that this patient is under my care and that I had a face-to-face encounter that meets the physician face-to-face encounter requirements with this patient on this date. The encounter with the patient was in whole or in part for the following MEDICAL CONDITION: (primary reason for [place])  MEDICAL NECESSITY: I certify, that based on my findings, NURSING services are a medically necessary home health service. HOME BOUND STATUS: I certify that my clinical findings support that this patient is homebound (i.e., Due to illness or injury, pt requires aid of supportive devices such as crutches, cane, wheelchairs, walkers, the use of special transportation or the assistance of another person to leave their place of residence. There is a normal inability to leave the  Symptoms: No Complaints or Symptoms Medical History: Negative for: Aspiration; Asthma; Chronic Obstructive Pulmonary Disease (COPD); Pneumothorax; Sleep Apnea; Tuberculosis Past Medical History Notes: pulmonary fibrosis, home O2 at night and PRN Cardiovascular [name], [name] ([id number]) Complaints and Symptoms: No Complaints or Symptoms Medical History: Positive for: Hypertension Negative for: Angina; Arrhythmia; Congestive Heart Failure; Coronary Artery Disease; Deep Vein Thrombosis; Hypotension; Myocardial Infarction; Peripheral Arterial Disease; Peripheral Venous Disease; Phlebitis; Vasculitis Gastrointestinal Medical History: Negative for: Cirrhosis ; Colitis; Crohnos; Hepatitis A; Hepatitis B; Hepatitis C Past Medical History Notes: diverticulosis Endocrine Medical History: Negative for: Type I Diabetes; Type II Diabetes Genitourinary Medical History: Negative for: End Stage Renal Disease Immunological Medical History: Negative for: Lupus Erythematosus; Raynaudos;  Scleroderma Integumentary (Skin) Medical History: Negative for: History of Burn; History of pressure wounds Musculoskeletal Medical History: Positive for: Osteoarthritis Negative for: Gout; Rheumatoid Arthritis; Osteomyelitis Neurologic Medical History: Negative for: Dementia; Neuropathy; Quadriplegia; Paraplegia; Seizure Disorder Oncologic Medical History: Negative for: Received Chemotherapy; Received Radiation Psychiatric Complaints and Symptoms: No Complaints or Symptoms Medical History: Negative for: Anorexia/bulimia; Confinement Anxiety [name], [name]. ([id number]) Immunizations Pneumococcal Vaccine: Received Pneumococcal Vaccination: Yes Implantable Devices Family and Social History Cancer: Yes - Mother; Diabetes: No; Heart Disease: No; Hereditary Spherocytosis: No; Hypertension: No; Kidney Disease: No; Lung Disease: Yes - Siblings; Seizures: No; Stroke: No; Thyroid Problems: No; Tuberculosis: No; Never smoker; Marital Status - Married; Alcohol Use: Never; Drug Use: No History; Caffeine Use: Daily; Financial Concerns: No; Food, Clothing or Shelter Needs: No; Support System Lacking: No; Transportation Concerns: No; Advanced Directives: No; Patient does not want information on Advanced Directives Physician Affirmation I have reviewed and agree with the above information. Electronic Signature(s) Signed: 07/16/2018 5:15:19 PM By: Terri Keeler PA-C Signed: 07/16/2018 5:19:23 PM By: [name] Entered By: [name] on 07/16/2018 13:37:00 Redlich, Orlean [name] ([id number]) -------------------------------------------------------------------------------- SuperBill Details Patient Name: TAVON, CORRIHER. Date of Service: 07/16/2018 Medical Record Number: [id number] Patient Account Number: [id number] Date of Birth/Sex: [birth year] (82 y.o. F) Treating RN: [name] Primary Care Provider: Fenton [name] Other Clinician: Referring Provider: Fenton [name] Treating Provider/Extender: [name], Aneliese Beaudry [name] in Treatment: 12 Diagnosis Coding ICD-10 Codes Code Description [phone] Laceration without foreign body, left lower leg, sequela S80.212S Abrasion, left knee, sequela S80.211S Abrasion, right knee, sequela L97.822 Non-pressure chronic ulcer of other part of left lower leg with fat layer exposed Facility Procedures CPT4 Code Description: [id number] 11042 - DEB SUBQ TISSUE 20 SQ CM/< ICD-10 Diagnosis Description L97.822 Non-pressure chronic ulcer of other part of left lower leg with Modifier: fat layer expos Quantity: 1 ed Physician Procedures CPT4 Code Description: [id number] 56256 - WC PHYS SUBQ TISS 20 SQ CM ICD-10 Diagnosis Description L97.822 Non-pressure chronic ulcer of other part of left lower leg with Modifier: fat layer expos Quantity: 1 ed Electronic Signature(s) Signed: 07/16/2018 5:15:19 PM By: Terri Keeler PA-C Entered By: [name] on 07/16/2018 13:37:50  Symptoms: No Complaints or Symptoms Medical History: Negative for: Aspiration; Asthma; Chronic Obstructive Pulmonary Disease (COPD); Pneumothorax; Sleep Apnea; Tuberculosis Past Medical History Notes: pulmonary fibrosis, home O2 at night and PRN Cardiovascular [name], [name] ([id number]) Complaints and Symptoms: No Complaints or Symptoms Medical History: Positive for: Hypertension Negative for: Angina; Arrhythmia; Congestive Heart Failure; Coronary Artery Disease; Deep Vein Thrombosis; Hypotension; Myocardial Infarction; Peripheral Arterial Disease; Peripheral Venous Disease; Phlebitis; Vasculitis Gastrointestinal Medical History: Negative for: Cirrhosis ; Colitis; Crohnos; Hepatitis A; Hepatitis B; Hepatitis C Past Medical History Notes: diverticulosis Endocrine Medical History: Negative for: Type I Diabetes; Type II Diabetes Genitourinary Medical History: Negative for: End Stage Renal Disease Immunological Medical History: Negative for: Lupus Erythematosus; Raynaudos;  Scleroderma Integumentary (Skin) Medical History: Negative for: History of Burn; History of pressure wounds Musculoskeletal Medical History: Positive for: Osteoarthritis Negative for: Gout; Rheumatoid Arthritis; Osteomyelitis Neurologic Medical History: Negative for: Dementia; Neuropathy; Quadriplegia; Paraplegia; Seizure Disorder Oncologic Medical History: Negative for: Received Chemotherapy; Received Radiation Psychiatric Complaints and Symptoms: No Complaints or Symptoms Medical History: Negative for: Anorexia/bulimia; Confinement Anxiety [name], [name]. ([id number]) Immunizations Pneumococcal Vaccine: Received Pneumococcal Vaccination: Yes Implantable Devices Family and Social History Cancer: Yes - Mother; Diabetes: No; Heart Disease: No; Hereditary Spherocytosis: No; Hypertension: No; Kidney Disease: No; Lung Disease: Yes - Siblings; Seizures: No; Stroke: No; Thyroid Problems: No; Tuberculosis: No; Never smoker; Marital Status - Married; Alcohol Use: Never; Drug Use: No History; Caffeine Use: Daily; Financial Concerns: No; Food, Clothing or Shelter Needs: No; Support System Lacking: No; Transportation Concerns: No; Advanced Directives: No; Patient does not want information on Advanced Directives Physician Affirmation I have reviewed and agree with the above information. Electronic Signature(s) Signed: 07/16/2018 5:15:19 PM By: Terri Keeler PA-C Signed: 07/16/2018 5:19:23 PM By: [name] Entered By: [name] on 07/16/2018 13:37:00 Redlich, Orlean [name] ([id number]) -------------------------------------------------------------------------------- SuperBill Details Patient Name: TAVON, CORRIHER. Date of Service: 07/16/2018 Medical Record Number: [id number] Patient Account Number: [id number] Date of Birth/Sex: [birth year] (82 y.o. F) Treating RN: [name] Primary Care Provider: Fenton [name] Other Clinician: Referring Provider: Fenton [name] Treating Provider/Extender: [name], Aneliese Beaudry [name] in Treatment: 12 Diagnosis Coding ICD-10 Codes Code Description [phone] Laceration without foreign body, left lower leg, sequela S80.212S Abrasion, left knee, sequela S80.211S Abrasion, right knee, sequela L97.822 Non-pressure chronic ulcer of other part of left lower leg with fat layer exposed Facility Procedures CPT4 Code Description: [id number] 11042 - DEB SUBQ TISSUE 20 SQ CM/< ICD-10 Diagnosis Description L97.822 Non-pressure chronic ulcer of other part of left lower leg with Modifier: fat layer expos Quantity: 1 ed Physician Procedures CPT4 Code Description: [id number] 56256 - WC PHYS SUBQ TISS 20 SQ CM ICD-10 Diagnosis Description L97.822 Non-pressure chronic ulcer of other part of left lower leg with Modifier: fat layer expos Quantity: 1 ed Electronic Signature(s) Signed: 07/16/2018 5:15:19 PM By: Terri Keeler PA-C Entered By: [name] on 07/16/2018 13:37:50  [id number] Patient Account Number: [id number] Date of Birth/Sex: 1936-08-03 (82 y.o. F) Treating RN: [name] Primary Care Provider: Fenton [name] Other Clinician: Referring Provider: Fenton [name] Treating Provider/Extender: [name], [name] in Treatment: 69 Constitutional Well-nourished and well-hydrated in no acute distress. Respiratory normal breathing without difficulty. Cardiovascular regular rate and rhythm with normal S1, S2. Psychiatric this patient is able to make decisions and demonstrates good insight into disease process. Alert and Oriented x 3. pleasant and cooperative. Notes Patient did require mild debridement today to remove some of the Slough/biofilm on the surface of the wound this was minimum. I believe it may be the last time that we have to perform any debridement at the dressings continue to do as well as they are currently. Post debridement the wound bed appears to be doing excellent. Electronic Signature(s) Signed: 07/16/2018 5:15:19 PM By: Terri Keeler PA-C Entered By: [name] on 07/16/2018 13:37:17 [name] ([id number]) -------------------------------------------------------------------------------- Physician Orders Details Patient Name: [name]. Date of Service: 07/16/2018 10:15 AM Medical Record Number: [id number] Patient Account Number: [id number] Date of Birth/Sex: [birth year] (82 y.o. F) Treating RN: [name] Primary Care Provider: Fenton [name] Other Clinician: Referring Provider: Fenton [name] Treating Provider/Extender: [name], Takisha Pelle [name] in Treatment: 1 Verbal / Phone Orders:  No Diagnosis Coding ICD-10 Coding Code Description [id number] Laceration without foreign body, left lower leg, sequela S80.212S Abrasion, left knee, sequela S80.211S Abrasion, right knee, sequela L97.822 Non-pressure chronic ulcer of other part of left lower leg with fat layer exposed Wound Cleansing Wound #3 Left,Lateral Lower Leg o Clean wound with Normal Saline. Anesthetic (add to Medication List) Wound #3 Left,Lateral Lower Leg o Topical Lidocaine 4% cream applied to wound bed prior to debridement (In Clinic Only). Primary Wound Dressing Wound #3 Left,Lateral Lower Leg o Hydrafera Blue Ready Transfer Secondary Dressing Wound #3 Left,Lateral Lower Leg o ABD and Kerlix/Conform - ace wrap Dressing Change Frequency Wound #3 Left,Lateral Lower Leg o Three times weekly - [facility] Tuesday and Saturdays dressing changes. Patient comes to clinic on Thursdays. Follow-up Appointments Wound #3 Left,Lateral Lower Leg o Return Appointment in 3 [name]. Edema Control Wound #3 Left,Lateral Lower Leg o Elevate legs to the level of the heart and pump ankles as often as possible o Other: - ACE wrap Additional Orders / Instructions Wound #3 Left,Lateral Lower Leg o Vitamin A; Vitamin C, Zinc - Please add a multivitamin that has 100% of vitamin A, vitamin C and zinc supplements in it [name], [name]. ([id number]) o Increase protein intake. o Activity as tolerated Home Health Wound #3 Left,Lateral Lower Leg o [place] Nurse may visit PRN to address patientos wound care needs. o FACE TO FACE ENCOUNTER: MEDICARE and MEDICAID PATIENTS: I certify that this patient is under my care and that I had a face-to-face encounter that meets the physician face-to-face encounter requirements with this patient on this date. The encounter with the patient was in whole or in part for the following MEDICAL CONDITION: (primary reason for [place])  MEDICAL NECESSITY: I certify, that based on my findings, NURSING services are a medically necessary home health service. HOME BOUND STATUS: I certify that my clinical findings support that this patient is homebound (i.e., Due to illness or injury, pt requires aid of supportive devices such as crutches, cane, wheelchairs, walkers, the use of special transportation or the assistance of another person to leave their place of residence. There is a normal inability to leave the  wrap Additional Orders / Instructions: Wound #3 Left,Lateral Lower Leg: Vitamin A; Vitamin C, Zinc - Please add a multivitamin that has 100% of vitamin A, vitamin C and zinc supplements in it Increase protein intake. Activity as tolerated Home Health: Wound #3 Left,Lateral Lower Leg: [place] Nurse may visit PRN to address patient s wound care needs. FACE TO FACE ENCOUNTER: MEDICARE and MEDICAID PATIENTS: I certify that this patient is under my care and that I had a face-to-face encounter that meets the physician face-to-face encounter requirements with this patient on this date. The encounter with the patient was in whole or in part for the following MEDICAL CONDITION: (primary reason for [place]) MEDICAL NECESSITY: I certify, that based on my findings, NURSING services are a medically necessary home health service. HOME BOUND STATUS: I certify that my clinical findings support that this patient is homebound (i.e., Due to illness or injury, pt requires aid of supportive devices such as crutches, cane, wheelchairs, walkers, the use of special transportation or the assistance of another person to leave their place of residence.  There is a normal inability to leave the home and doing so requires considerable and taxing effort. Other absences are for medical reasons / religious services and are infrequent or of short duration when for other reasons). If current dressing causes regression in wound condition, may D/C ordered dressing product/s and apply Normal Saline Moist Dressing daily until next [place] / Other MD appointment. [place] of regression in wound condition at [phone]. Please direct any NON-WOUND related issues/requests for orders to patient's Primary Care Physician At this time I'm gonna recommend that we go ahead and continue with the above wound care measures for the next week. The patient is in agreement with plan. We will subsequently see were things stand at follow-up. Please see above for specific wound care orders. We will see patient for re-evaluation in 3 week(s) here in the clinic. If anything worsens or changes patient will contact our office for additional recommendations. Electronic Signature(s) [name], [name] ([id number]) Signed: 07/16/2018 5:15:19 PM By: Terri Keeler PA-C Entered By: [name] on 07/16/2018 13:37:41 Smick, Orlean [name] ([id number]) -------------------------------------------------------------------------------- ROS/PFSH Details Patient Name: [name], [name]. Date of Service: 07/16/2018 10:15 AM Medical Record Number: [id number] Patient Account Number: [id number] Date of Birth/Sex: 31-Jan-1936 (82 y.o. F) Treating RN: [name] Primary Care Provider: Fenton [name] Other Clinician: Referring Provider: Fenton [name] Treating Provider/Extender: [name], Lary Eckardt [name] in Treatment: 12 Information Obtained From Patient Wound History Do you currently have one or more open woundso Yes How many open wounds do you currently haveo 2 Approximately how long have you had your woundso 4 days How have you been treating  your wound(s) until nowo bandage from the ER Has your wound(s) ever healed and then re-openedo No Have you had any lab work done in the past montho Yes Who ordered the lab work doneo [facility] Have you tested positive for an antibiotic resistant organism (MRSA, VRE)o No Have you tested positive for osteomyelitis (bone infection)o No Have you had any tests for circulation on your legso No Have you had other problems associated with your woundso Swelling Constitutional Symptoms (General Health) Complaints and Symptoms: Negative for: Fever; Chills Eyes Medical History: Negative for: Cataracts; Glaucoma; Optic Neuritis Ear/Nose/Mouth/Throat Medical History: Negative for: Chronic sinus problems/congestion; Middle ear problems Hematologic/Lymphatic Medical History: Negative for: Anemia; Hemophilia; Human Immunodeficiency Virus; Lymphedema; Sickle Cell Disease Respiratory Complaints and  MERRILL, DEANDA ([id number]) Visit Report for 07/16/2018 Chief Complaint Document Details Patient Name: [name], [name]. Date of Service: 07/16/2018 10:15 AM Medical Record Number: [id number] Patient Account Number: [id number] Date of Birth/Sex: 1935/11/18 (82 y.o. F) Treating RN: [name] Primary Care Provider: Fenton [name] Other Clinician: Referring Provider: Fenton [name] Treating Provider/Extender: [name], Anshul Meddings [name] in Treatment: 12 Information Obtained from: Patient Chief Complaint LLE Ulcers Electronic Signature(s) Signed: 07/16/2018 5:15:19 PM By: Terri Keeler PA-C Entered By: [name] on 07/16/2018 10:19:10 [name], [name] ([id number]) -------------------------------------------------------------------------------- Debridement Details Patient Name: [name] Date of Service: 07/16/2018 10:15 AM Medical Record Number: [id number] Patient Account Number: [id number] Date of Birth/Sex: 1936-04-21 (82 y.o. F) Treating RN: [name] Primary Care Provider: Fenton [name] Other Clinician: Referring Provider: Fenton [name] Treating Provider/Extender: [name], Laiyla Slagel [name] in Treatment: 12 Debridement Performed for Wound #3 Left,Lateral Lower Leg Assessment: Performed By: Physician STONE III, Anselm Aumiller E., PA-C Debridement Type: Debridement Level of Consciousness (Pre- Awake and Alert procedure): Pre-procedure Verification/Time Yes - 10:57 Out Taken: Start Time: 10:57 Pain Control: Lidocaine 4% Topical Solution Total Area Debrided (L x W): 1.7 (cm) x 2.7 (cm) = 4.59 (cm) Tissue and other material Viable, Non-Viable, Slough, Subcutaneous, Slough debrided: Level: Skin/Subcutaneous Tissue Debridement Description: Excisional Instrument: Curette Bleeding: Minimum Hemostasis Achieved: Pressure End Time: 10:59 Procedural Pain: 0 Post Procedural Pain: 0 Response to Treatment: Procedure was tolerated well Level of  Consciousness Awake and Alert (Post-procedure): Post Debridement Measurements of Total Wound Length: (cm) 1.7 Width: (cm) 2.7 Depth: (cm) 0.2 Volume: (cm) 0.721 Character of Wound/Ulcer Post Debridement: Improved Post Procedure Diagnosis Same as Pre-procedure Electronic Signature(s) Signed: 07/16/2018 5:15:19 PM By: Terri Keeler PA-C Signed: 07/16/2018 5:19:23 PM By: [name] Entered By: [name] on 07/16/2018 10:59:52 Kloss, Orlean [name] ([id number]) -------------------------------------------------------------------------------- HPI Details Patient Name: SHARION, GRIEVES. Date of Service: 07/16/2018 10:15 AM Medical Record Number: [id number] Patient Account Number: [id number] Date of Birth/Sex: 10-Aug-1936 (82 y.o. F) Treating RN: [name] Primary Care Provider: Fenton [name] Other Clinician: Referring Provider: Fenton [name] Treating Provider/Extender: [name], Ninetta Adelstein [name] in Treatment: 12 History of Present Illness HPI Description: 04/23/18-She is seen in initial evaluation for multiple skin tears s/p fall on 8/19. She did sustain a closed fracture of the right patella secondary to that fall. She presented to emergency department on 8/19 for treatment. The left leg laceration was unable to be sutured closed, Steri-Strips were applied and she was treated with knee immobilizer for the patella fracture. She continues on Keflex that was initiated on 8/19. The left leg skin tear flap was cleansed and reapproximated, secure with steri strips. She will follow up next week 04/30/18-She is seen in follow-up evaluation for multiple skin tears. She continues with knee immobilizers to the right lower extremity, has developed lower extremity edema with subsequent weeping; will initiate ace wrap compression. The flap is mostly devitalized, which was somewhat expected; we will change treatment plan and she will follow up next week 05/07/18-She is seen in follow-up  evaluation for multiple wounds. The left lateral lower leg skin flap is nonviable/devitalized, now eschar covered. She tolerated debridement fairly well. The right lower extremity skin tear that was new last week has essentially healed. She has responded nicely to Ace wrap compression. We will modify treatment plan and she will follow-up next week 05/14/18 on evaluation today patient actually appears to be doing a little better in regard to her left lower extremity ulcers. With that being said she  wrap Additional Orders / Instructions: Wound #3 Left,Lateral Lower Leg: Vitamin A; Vitamin C, Zinc - Please add a multivitamin that has 100% of vitamin A, vitamin C and zinc supplements in it Increase protein intake. Activity as tolerated Home Health: Wound #3 Left,Lateral Lower Leg: [place] Nurse may visit PRN to address patient s wound care needs. FACE TO FACE ENCOUNTER: MEDICARE and MEDICAID PATIENTS: I certify that this patient is under my care and that I had a face-to-face encounter that meets the physician face-to-face encounter requirements with this patient on this date. The encounter with the patient was in whole or in part for the following MEDICAL CONDITION: (primary reason for [place]) MEDICAL NECESSITY: I certify, that based on my findings, NURSING services are a medically necessary home health service. HOME BOUND STATUS: I certify that my clinical findings support that this patient is homebound (i.e., Due to illness or injury, pt requires aid of supportive devices such as crutches, cane, wheelchairs, walkers, the use of special transportation or the assistance of another person to leave their place of residence.  There is a normal inability to leave the home and doing so requires considerable and taxing effort. Other absences are for medical reasons / religious services and are infrequent or of short duration when for other reasons). If current dressing causes regression in wound condition, may D/C ordered dressing product/s and apply Normal Saline Moist Dressing daily until next [place] / Other MD appointment. [place] of regression in wound condition at [phone]. Please direct any NON-WOUND related issues/requests for orders to patient's Primary Care Physician At this time I'm gonna recommend that we go ahead and continue with the above wound care measures for the next week. The patient is in agreement with plan. We will subsequently see were things stand at follow-up. Please see above for specific wound care orders. We will see patient for re-evaluation in 3 week(s) here in the clinic. If anything worsens or changes patient will contact our office for additional recommendations. Electronic Signature(s) [name], [name] ([id number]) Signed: 07/16/2018 5:15:19 PM By: Terri Keeler PA-C Entered By: [name] on 07/16/2018 13:37:41 Smick, Orlean [name] ([id number]) -------------------------------------------------------------------------------- ROS/PFSH Details Patient Name: [name], [name]. Date of Service: 07/16/2018 10:15 AM Medical Record Number: [id number] Patient Account Number: [id number] Date of Birth/Sex: 31-Jan-1936 (82 y.o. F) Treating RN: [name] Primary Care Provider: Fenton [name] Other Clinician: Referring Provider: Fenton [name] Treating Provider/Extender: [name], Lary Eckardt [name] in Treatment: 12 Information Obtained From Patient Wound History Do you currently have one or more open woundso Yes How many open wounds do you currently haveo 2 Approximately how long have you had your woundso 4 days How have you been treating  your wound(s) until nowo bandage from the ER Has your wound(s) ever healed and then re-openedo No Have you had any lab work done in the past montho Yes Who ordered the lab work doneo [facility] Have you tested positive for an antibiotic resistant organism (MRSA, VRE)o No Have you tested positive for osteomyelitis (bone infection)o No Have you had any tests for circulation on your legso No Have you had other problems associated with your woundso Swelling Constitutional Symptoms (General Health) Complaints and Symptoms: Negative for: Fever; Chills Eyes Medical History: Negative for: Cataracts; Glaucoma; Optic Neuritis Ear/Nose/Mouth/Throat Medical History: Negative for: Chronic sinus problems/congestion; Middle ear problems Hematologic/Lymphatic Medical History: Negative for: Anemia; Hemophilia; Human Immunodeficiency Virus; Lymphedema; Sickle Cell Disease Respiratory Complaints and

## 2018-07-20 DIAGNOSIS — M6281 Muscle weakness (generalized): Secondary | ICD-10-CM | POA: Diagnosis not present

## 2018-07-20 DIAGNOSIS — M5416 Radiculopathy, lumbar region: Secondary | ICD-10-CM | POA: Diagnosis not present

## 2018-07-21 ENCOUNTER — Encounter: Payer: Self-pay | Admitting: Obstetrics and Gynecology

## 2018-07-21 ENCOUNTER — Ambulatory Visit (INDEPENDENT_AMBULATORY_CARE_PROVIDER_SITE_OTHER): Payer: Medicare Other | Admitting: Obstetrics and Gynecology

## 2018-07-21 VITALS — BP 135/61 | HR 70 | Ht 60.0 in | Wt 152.0 lb

## 2018-07-21 DIAGNOSIS — I10 Essential (primary) hypertension: Secondary | ICD-10-CM | POA: Diagnosis not present

## 2018-07-21 DIAGNOSIS — M545 Low back pain, unspecified: Secondary | ICD-10-CM

## 2018-07-21 DIAGNOSIS — M150 Primary generalized (osteo)arthritis: Secondary | ICD-10-CM | POA: Diagnosis not present

## 2018-07-21 DIAGNOSIS — N8110 Cystocele, unspecified: Secondary | ICD-10-CM

## 2018-07-21 DIAGNOSIS — I6523 Occlusion and stenosis of bilateral carotid arteries: Secondary | ICD-10-CM | POA: Diagnosis not present

## 2018-07-21 DIAGNOSIS — N952 Postmenopausal atrophic vaginitis: Secondary | ICD-10-CM | POA: Diagnosis not present

## 2018-07-21 DIAGNOSIS — R3989 Other symptoms and signs involving the genitourinary system: Secondary | ICD-10-CM

## 2018-07-21 DIAGNOSIS — Z960 Presence of urogenital implants: Secondary | ICD-10-CM

## 2018-07-21 DIAGNOSIS — J84112 Idiopathic pulmonary fibrosis: Secondary | ICD-10-CM | POA: Diagnosis not present

## 2018-07-21 DIAGNOSIS — M4856XS Collapsed vertebra, not elsewhere classified, lumbar region, sequela of fracture: Secondary | ICD-10-CM | POA: Diagnosis not present

## 2018-07-21 DIAGNOSIS — S81812D Laceration without foreign body, left lower leg, subsequent encounter: Secondary | ICD-10-CM | POA: Diagnosis not present

## 2018-07-21 DIAGNOSIS — Z9181 History of falling: Secondary | ICD-10-CM | POA: Diagnosis not present

## 2018-07-21 LAB — POCT URINALYSIS DIPSTICK
Bilirubin, UA: NEGATIVE
Blood, UA: NEGATIVE
Glucose, UA: NEGATIVE
Nitrite, UA: NEGATIVE
Protein, UA: POSITIVE — AB
Spec Grav, UA: 1.025 (ref 1.010–1.025)
Urobilinogen, UA: 0.2 E.U./dL
pH, UA: 6 (ref 5.0–8.0)

## 2018-07-21 NOTE — Progress Notes (Signed)
    GYNECOLOGY PROGRESS NOTE  Subjective:    Patient ID: [name], female    DOB: 1936-07-05, 82 y.o.   MRN: [id number]  HPI  Patient is a 82 y.o. G2P2 female with vaginal pessary in situ with complaints of left-sided pelvic pain and back pain.  Had pessary check last month.  Denies itching or burning.  Denies problems with urination. She denies difficulty urinating or moving her bowels.  Does report ~ 2 weeks ago having constipation x 3 days which has resolved.  Notes back pain resolved today.   She does note that she has recently had minor back surgery several weeks ago. Notes current back pain is different from her usual chronic back pain. Wonders if she may have a UTI.  ? The following portions of the patient's history were reviewed and updated as appropriate: allergies, current medications, past family history, past medical history, past social history, past surgical history and problem list.  Review of Systems Pertinent items noted in HPI and remainder of comprehensive ROS otherwise negative.   Objective:   Blood pressure 135/61, pulse 70, height 5' (1.524 m), weight 152 lb (68.9 kg).    General appearance: alert and no distress  Abdomen: soft, non-tender. No masses palpable or organomegaly. No suprapubic tenderness.  Pelvis: The patient's Size 3 ring with support pessary was removed, and cleaned. Speculum examination revealed mildly atrophic vaginal mucosa and urethra with no lesions. Scant thin white discharge also present.    Labs:  Results for orders placed or performed in visit on 07/21/18  POCT urinalysis dipstick  Result Value Ref Range   Color, UA yellow    Clarity, UA clear    Glucose, UA Negative Negative   Bilirubin, UA neg    Ketones, UA small(15)    Spec Grav, UA 1.025 1.010 - 1.025   Blood, UA neg    pH, UA 6.0 5.0 - 8.0   Protein, UA Positive (A) Negative   Urobilinogen, UA 0.2 0.2 or 1.0 E.U./dL   Nitrite, UA neg    Leukocytes, UA Moderate (2+)  (A) Negative   Appearance yellow    Odor      Assessment:   Pessary in situ Cystocele Vaginal atrophy Back pain   Plan:   - Continue Premarin cream for vaginal atrophy, once weekly.Continue 0.5 mg dosing. - Removed and reinserted pessary today. To f/u in 10-12 weeks for pessary check.  - UA with no obvious signs of UTI, but will send culture to rule out bacteriuria.  - Advised that if back pain continues, can remove pessary for several weeks to take a break, and then reinsert. If pain persists, would need further f/u with her PCP.    Rubie Maid, MD [facility]

## 2018-07-21 NOTE — Progress Notes (Signed)
Pt stated that she is having sharp pain for about a month. Pt think that it is her pessary that is causing the problems. Urine test was done today to rule out possible UTI.

## 2018-07-22 DIAGNOSIS — M5416 Radiculopathy, lumbar region: Secondary | ICD-10-CM | POA: Diagnosis not present

## 2018-07-22 DIAGNOSIS — M6281 Muscle weakness (generalized): Secondary | ICD-10-CM | POA: Diagnosis not present

## 2018-07-23 LAB — URINE CULTURE: Organism ID, Bacteria: NO GROWTH

## 2018-07-25 DIAGNOSIS — S81812D Laceration without foreign body, left lower leg, subsequent encounter: Secondary | ICD-10-CM | POA: Diagnosis not present

## 2018-07-25 DIAGNOSIS — M150 Primary generalized (osteo)arthritis: Secondary | ICD-10-CM | POA: Diagnosis not present

## 2018-07-25 DIAGNOSIS — M4856XS Collapsed vertebra, not elsewhere classified, lumbar region, sequela of fracture: Secondary | ICD-10-CM | POA: Diagnosis not present

## 2018-07-25 DIAGNOSIS — Z9181 History of falling: Secondary | ICD-10-CM | POA: Diagnosis not present

## 2018-07-25 DIAGNOSIS — J84112 Idiopathic pulmonary fibrosis: Secondary | ICD-10-CM | POA: Diagnosis not present

## 2018-07-25 DIAGNOSIS — I10 Essential (primary) hypertension: Secondary | ICD-10-CM | POA: Diagnosis not present

## 2018-07-27 DIAGNOSIS — M5416 Radiculopathy, lumbar region: Secondary | ICD-10-CM | POA: Diagnosis not present

## 2018-07-27 DIAGNOSIS — M6281 Muscle weakness (generalized): Secondary | ICD-10-CM | POA: Diagnosis not present

## 2018-07-28 DIAGNOSIS — S81812D Laceration without foreign body, left lower leg, subsequent encounter: Secondary | ICD-10-CM | POA: Diagnosis not present

## 2018-07-28 DIAGNOSIS — J84112 Idiopathic pulmonary fibrosis: Secondary | ICD-10-CM | POA: Diagnosis not present

## 2018-07-28 DIAGNOSIS — Z9181 History of falling: Secondary | ICD-10-CM | POA: Diagnosis not present

## 2018-07-28 DIAGNOSIS — M4856XS Collapsed vertebra, not elsewhere classified, lumbar region, sequela of fracture: Secondary | ICD-10-CM | POA: Diagnosis not present

## 2018-07-28 DIAGNOSIS — M150 Primary generalized (osteo)arthritis: Secondary | ICD-10-CM | POA: Diagnosis not present

## 2018-07-28 DIAGNOSIS — I10 Essential (primary) hypertension: Secondary | ICD-10-CM | POA: Diagnosis not present

## 2018-08-01 DIAGNOSIS — J84112 Idiopathic pulmonary fibrosis: Secondary | ICD-10-CM | POA: Diagnosis not present

## 2018-08-01 DIAGNOSIS — S81812D Laceration without foreign body, left lower leg, subsequent encounter: Secondary | ICD-10-CM | POA: Diagnosis not present

## 2018-08-01 DIAGNOSIS — I10 Essential (primary) hypertension: Secondary | ICD-10-CM | POA: Diagnosis not present

## 2018-08-01 DIAGNOSIS — M4856XS Collapsed vertebra, not elsewhere classified, lumbar region, sequela of fracture: Secondary | ICD-10-CM | POA: Diagnosis not present

## 2018-08-01 DIAGNOSIS — M150 Primary generalized (osteo)arthritis: Secondary | ICD-10-CM | POA: Diagnosis not present

## 2018-08-01 DIAGNOSIS — Z9181 History of falling: Secondary | ICD-10-CM | POA: Diagnosis not present

## 2018-08-04 DIAGNOSIS — Z9181 History of falling: Secondary | ICD-10-CM | POA: Diagnosis not present

## 2018-08-04 DIAGNOSIS — J84112 Idiopathic pulmonary fibrosis: Secondary | ICD-10-CM | POA: Diagnosis not present

## 2018-08-04 DIAGNOSIS — M150 Primary generalized (osteo)arthritis: Secondary | ICD-10-CM | POA: Diagnosis not present

## 2018-08-04 DIAGNOSIS — I10 Essential (primary) hypertension: Secondary | ICD-10-CM | POA: Diagnosis not present

## 2018-08-04 DIAGNOSIS — M4856XS Collapsed vertebra, not elsewhere classified, lumbar region, sequela of fracture: Secondary | ICD-10-CM | POA: Diagnosis not present

## 2018-08-04 DIAGNOSIS — S81812D Laceration without foreign body, left lower leg, subsequent encounter: Secondary | ICD-10-CM | POA: Diagnosis not present

## 2018-08-05 DIAGNOSIS — M5416 Radiculopathy, lumbar region: Secondary | ICD-10-CM | POA: Diagnosis not present

## 2018-08-05 DIAGNOSIS — Z5181 Encounter for therapeutic drug level monitoring: Secondary | ICD-10-CM | POA: Diagnosis not present

## 2018-08-05 DIAGNOSIS — M6281 Muscle weakness (generalized): Secondary | ICD-10-CM | POA: Diagnosis not present

## 2018-08-05 DIAGNOSIS — Z79899 Other long term (current) drug therapy: Secondary | ICD-10-CM | POA: Diagnosis not present

## 2018-08-06 ENCOUNTER — Encounter: Payer: Medicare Other | Attending: Physician Assistant | Admitting: Physician Assistant

## 2018-08-06 DIAGNOSIS — Z79899 Other long term (current) drug therapy: Secondary | ICD-10-CM | POA: Insufficient documentation

## 2018-08-06 DIAGNOSIS — M1990 Unspecified osteoarthritis, unspecified site: Secondary | ICD-10-CM | POA: Diagnosis not present

## 2018-08-06 DIAGNOSIS — L97822 Non-pressure chronic ulcer of other part of left lower leg with fat layer exposed: Secondary | ICD-10-CM | POA: Insufficient documentation

## 2018-08-06 DIAGNOSIS — I10 Essential (primary) hypertension: Secondary | ICD-10-CM | POA: Insufficient documentation

## 2018-08-07 DIAGNOSIS — M5416 Radiculopathy, lumbar region: Secondary | ICD-10-CM | POA: Diagnosis not present

## 2018-08-07 DIAGNOSIS — M6281 Muscle weakness (generalized): Secondary | ICD-10-CM | POA: Diagnosis not present

## 2018-08-08 DIAGNOSIS — I10 Essential (primary) hypertension: Secondary | ICD-10-CM | POA: Diagnosis not present

## 2018-08-08 DIAGNOSIS — S81812D Laceration without foreign body, left lower leg, subsequent encounter: Secondary | ICD-10-CM | POA: Diagnosis not present

## 2018-08-08 DIAGNOSIS — M4856XS Collapsed vertebra, not elsewhere classified, lumbar region, sequela of fracture: Secondary | ICD-10-CM | POA: Diagnosis not present

## 2018-08-08 DIAGNOSIS — Z9181 History of falling: Secondary | ICD-10-CM | POA: Diagnosis not present

## 2018-08-08 DIAGNOSIS — J84112 Idiopathic pulmonary fibrosis: Secondary | ICD-10-CM | POA: Diagnosis not present

## 2018-08-08 DIAGNOSIS — M150 Primary generalized (osteo)arthritis: Secondary | ICD-10-CM | POA: Diagnosis not present

## 2018-08-10 DIAGNOSIS — M5416 Radiculopathy, lumbar region: Secondary | ICD-10-CM | POA: Diagnosis not present

## 2018-08-10 DIAGNOSIS — M6281 Muscle weakness (generalized): Secondary | ICD-10-CM | POA: Diagnosis not present

## 2018-08-11 DIAGNOSIS — M4856XS Collapsed vertebra, not elsewhere classified, lumbar region, sequela of fracture: Secondary | ICD-10-CM | POA: Diagnosis not present

## 2018-08-11 DIAGNOSIS — M150 Primary generalized (osteo)arthritis: Secondary | ICD-10-CM | POA: Diagnosis not present

## 2018-08-11 DIAGNOSIS — Z9181 History of falling: Secondary | ICD-10-CM | POA: Diagnosis not present

## 2018-08-11 DIAGNOSIS — J84112 Idiopathic pulmonary fibrosis: Secondary | ICD-10-CM | POA: Diagnosis not present

## 2018-08-11 DIAGNOSIS — I10 Essential (primary) hypertension: Secondary | ICD-10-CM | POA: Diagnosis not present

## 2018-08-11 DIAGNOSIS — S81812D Laceration without foreign body, left lower leg, subsequent encounter: Secondary | ICD-10-CM | POA: Diagnosis not present

## 2018-08-13 DIAGNOSIS — M533 Sacrococcygeal disorders, not elsewhere classified: Secondary | ICD-10-CM | POA: Diagnosis not present

## 2018-08-13 NOTE — Progress Notes (Signed)
[name] ([id number]) Visit Report for 08/06/2018 Chief Complaint Document Details Patient Name: [name], [name]. Date of Service: 08/06/2018 11:00 AM Medical Record Number: [id number] Patient Account Number: [id number] Date of Birth/Sex: [birth year] (82 y.o. Female) Treating RN: Montey Hora Primary Care Provider: Fenton Malling Other Clinician: Referring Provider: Fenton Malling Treating Provider/Extender: Melburn Hake, HOYT Weeks in Treatment: 15 Information Obtained from: Patient Chief Complaint LLE Ulcers Electronic Signature(s) Signed: 08/06/2018 9:49:28 PM By: [name] PA-C Entered By: [name] on 08/06/2018 11:39:26 Golembeski, [name] ([id number]) -------------------------------------------------------------------------------- Debridement Details Patient Name: [name] Date of Service: 08/06/2018 11:00 AM Medical Record Number: [id number] Patient Account Number: [id number] Date of Birth/Sex: Feb 06, 1936 (82 y.o. Female) Treating RN: Harold Barban Primary Care Provider: Fenton Malling Other Clinician: Referring Provider: Fenton Malling Treating Provider/Extender: Melburn Hake, HOYT Weeks in Treatment: 15 Debridement Performed for Wound #3 Left,Lateral Lower Leg Assessment: Performed By: Physician STONE III, HOYT E., PA-C Debridement Type: Debridement Level of Consciousness (Pre- Awake and Alert procedure): Pre-procedure Verification/Time Yes - 11:58 Out Taken: Start Time: 11:58 Pain Control: Lidocaine 4% Topical Solution Total Area Debrided (L x W): 0.6 (cm) x 1.2 (cm) = 0.72 (cm) Tissue and other material Viable, Subcutaneous, Skin: Epidermis debrided: Level: Skin/Subcutaneous Tissue Debridement Description: Excisional Instrument: Forceps, Scissors Bleeding: None End Time: 12:01 Procedural Pain: 0 Post Procedural Pain: 0 Response to Treatment: Procedure was tolerated well Level of Consciousness Awake and  Alert (Post-procedure): Post Debridement Measurements of Total Wound Length: (cm) 0.6 Width: (cm) 1.2 Depth: (cm) 0.1 Volume: (cm) 0.057 Character of Wound/Ulcer Post Debridement: Improved Post Procedure Diagnosis Same as Pre-procedure Electronic Signature(s) Signed: 08/06/2018 9:49:28 PM By: [name] PA-C Signed: 08/11/2018 5:05:35 PM By: Harold Barban Entered By: Harold Barban on 08/06/2018 12:02:33 DESIA, SABAN ([id number]) -------------------------------------------------------------------------------- HPI Details Patient Name: [name]. Date of Service: 08/06/2018 11:00 AM Medical Record Number: [id number] Patient Account Number: [id number] Date of Birth/Sex: April 03, 1936 (82 y.o. Female) Treating RN: Montey Hora Primary Care Provider: Fenton Malling Other Clinician: Referring Provider: Fenton Malling Treating Provider/Extender: Melburn Hake, HOYT Weeks in Treatment: 15 History of Present Illness HPI Description: 04/23/18-She is seen in initial evaluation for multiple skin tears s/p fall on 8/19. She did sustain a closed fracture of the right patella secondary to that fall. She presented to emergency department on 8/19 for treatment. The left leg laceration was unable to be sutured closed, Steri-Strips were applied and she was treated with knee immobilizer for the patella fracture. She continues on Keflex that was initiated on 8/19. The left leg skin tear flap was cleansed and reapproximated, secure with steri strips. She will follow up next week 04/30/18-She is seen in follow-up evaluation for multiple skin tears. She continues with knee immobilizers to the right lower extremity, has developed lower extremity edema with subsequent weeping; will initiate ace wrap compression. The flap is mostly devitalized, which was somewhat expected; we will change treatment plan and she will follow up next week 05/07/18-She is seen in follow-up evaluation for  multiple wounds. The left lateral lower leg skin flap is nonviable/devitalized, now eschar covered. She tolerated debridement fairly well. The right lower extremity skin tear that was new last week has essentially healed. She has responded nicely to Ace wrap compression. We will modify treatment plan and she will follow-up next week 05/14/18 on evaluation today patient actually appears to be doing a little better in regard to her left lower extremity ulcers. With that being said she has been tolerating  the dressing changes without complication. Medihoney is what she was switch to dear in the last week's visit. Prior to that we used Iodoflex when she had the eschar present. Nonetheless I believe that the Iodoflex may be the best thing for her at this point in regard to the current wound bed and prevention of biofilm buildup. Nonetheless she seems to be tolerating the dressing changes without complication which is good news. It's really the debridement the calls are the most pain last week she did allow for debridement this week she also agreed to allow me to attempt debridement in order to try to help this wound to heal more quickly. 05/21/18 on evaluation today patient actually appears to be doing much better at this time in regard to her left lateral lower Trinity ulcer. She's been tolerating the dressing changes without complication. Fortunately there does not appear to be any evidence of infection at this time. There is still some Slough noted on the surface of the wound although not as much as previous. She continues to have a lot of discomfort currently. She tells me that once we put the Iodoflex on the last time it actually burned from Thursday till Sunday night and then eased off. Nonetheless it does seem to have made a great improvement in general in regard to the patient's wound bed. 05/28/18 patient's wound bed at this point actually show signs of good improvement which is excellent news  currently. She has been tolerating the dressing changes without complication. She does have a little bit of slightly hyper granular tissue at this point there's also some necrotic tissue noted on the surface of the wound. There does not appear to be any evidence of infection overall at this time. In general I feel like that she has made good progress in the past two weeks that I have been seeing her. 06/04/18 on evaluation today patient's left lower Trinity ulcer actually appears to be doing very well today. She has great improvement with excellent epithelialization at this time. Overall I feel like she has made great progress even since last time I seen her. There's really no necrotic tissue on the surface of the wound which is also great news her pain appears to be better. 06/18/18 on evaluation today the patient continues to show signs of improvement in regard to her lower extremity ulcer. She's been tolerating the Hydrofera Blue Dressing without complication and the wound bed seems to be doing excellent at this time. Overall I'm very pleased with the progress and were things stand. 07/02/18 on evaluation today patient actually appears to be doing excellent in regard to her left lower Trinity ulcer. This is definitely smaller and is shown signs of good improvement. It is going somewhat slow but nonetheless we have a steady progression of the wound which is great news. Overall very pleased with the progress that has been made. She's having very CAM, HARNDEN. ([id number]) little pain at this time. 07/16/18 on evaluation today patient actually appears to be doing very well in regard to her lower extremity ulcer. She's been tolerating the dressing changes without complication. Fortunately there is no evidence of anything infection wise occurring at this time. She barely is going to require debridement today. 08/06/18 the on evaluation today patient actually appears to be doing much better in  regard to her lower Trinity ulcer. In fact this appears to be very close to complete healing. Fortunately there's no sign of infection at this time. The [facility] Dressing  to continues to stick somewhat to the wound bed unfortunately. Electronic Signature(s) Signed: 08/06/2018 9:49:28 PM By: [name] PA-C Entered By: [name] on 08/06/2018 12:48:04 AURIA, MCKINLAY ([id number]) -------------------------------------------------------------------------------- Physical Exam Details Patient Name: EARNESTEEN, BIRNIE. Date of Service: 08/06/2018 11:00 AM Medical Record Number: [id number] Patient Account Number: [id number] Date of Birth/Sex: [birth year] (82 y.o. Female) Treating RN: Montey Hora Primary Care Provider: Fenton Malling Other Clinician: Referring Provider: Fenton Malling Treating Provider/Extender: Melburn Hake, HOYT Weeks in Treatment: 53 Constitutional Well-nourished and well-hydrated in no acute distress. Respiratory normal breathing without difficulty. clear to auscultation bilaterally. Cardiovascular regular rate and rhythm with normal S1, S2. Psychiatric this patient is able to make decisions and demonstrates good insight into disease process. Alert and Oriented x 3. pleasant and cooperative. Notes Patient's wound currently as shown signs of excellent improvement which is great news. Overall I'm extremely pleased with where things stand at this point. Electronic Signature(s) Signed: 08/06/2018 9:49:28 PM By: [name] PA-C Entered By: [name] on 08/06/2018 12:49:21 CESIAH, WESTLEY ([id number]) -------------------------------------------------------------------------------- Physician Orders Details Patient Name: CARMEL, GARFIELD. Date of Service: 08/06/2018 11:00 AM Medical Record Number: [id number] Patient Account Number: [id number] Date of Birth/Sex: Jun 29, 1936 (82 y.o. Female) Treating RN: Harold Barban Primary Care  Provider: Fenton Malling Other Clinician: Referring Provider: Fenton Malling Treating Provider/Extender: Melburn Hake, HOYT Weeks in Treatment: 15 Verbal / Phone Orders: No Diagnosis Coding ICD-10 Coding Code Description [phone] Laceration without foreign body, left lower leg, sequela S80.212S Abrasion, left knee, sequela S80.211S Abrasion, right knee, sequela L97.822 Non-pressure chronic ulcer of other part of left lower leg with fat layer exposed Wound Cleansing Wound #3 Left,Lateral Lower Leg o Clean wound with Normal Saline. Anesthetic (add to Medication List) Wound #3 Left,Lateral Lower Leg o Topical Lidocaine 4% cream applied to wound bed prior to debridement (In Clinic Only). Primary Wound Dressing Wound #3 Left,Lateral Lower Leg o Hydrafera Blue Ready Transfer - Add adaptic to wound bed before hydrafera blue, to protect. Secondary Dressing Wound #3 Left,Lateral Lower Leg o ABD and Kerlix/Conform - ace wrap Dressing Change Frequency Wound #3 Left,Lateral Lower Leg o Three times weekly - [facility] Tuesday and Saturdays dressing changes. Patient comes to clinic on Thursdays. Follow-up Appointments Wound #3 Left,Lateral Lower Leg o Return Appointment in 2 weeks. Edema Control Wound #3 Left,Lateral Lower Leg o Elevate legs to the level of the heart and pump ankles as often as possible o Other: - ACE wrap Additional Orders / Instructions Wound #3 Left,Lateral Lower Leg o Vitamin A; Vitamin C, Zinc - Please add a multivitamin that has 100% of vitamin A, vitamin C and zinc supplements in it BRIT, CARBONELL. ([id number]) o Increase protein intake. o Activity as tolerated Home Health Wound #3 Left,Lateral Lower Leg o [place] Nurse may visit PRN to address patientos wound care needs. o FACE TO FACE ENCOUNTER: MEDICARE and MEDICAID PATIENTS: I certify that this patient is under my care and that I had a  face-to-face encounter that meets the physician face-to-face encounter requirements with this patient on this date. The encounter with the patient was in whole or in part for the following MEDICAL CONDITION: (primary reason for [place]) MEDICAL NECESSITY: I certify, that based on my findings, NURSING services are a medically necessary home health service. HOME BOUND STATUS: I certify that my clinical findings support that this patient is homebound (i.e., Due to illness or injury, pt requires aid of supportive  devices such as crutches, cane, wheelchairs, walkers, the use of special transportation or the assistance of another person to leave their place of residence. There is a normal inability to leave the home and doing so requires considerable and taxing effort. Other absences are for medical reasons / religious services and are infrequent or of short duration when for other reasons). o If current dressing causes regression in wound condition, may D/C ordered dressing product/s and apply Normal Saline Moist Dressing daily until next [place] / Other MD appointment. [place] of regression in wound condition at [phone]. o Please direct any NON-WOUND related issues/requests for orders to patient's Primary Care Physician Electronic Signature(s) Signed: 08/06/2018 9:49:28 PM By: [name] PA-C Signed: 08/11/2018 5:05:35 PM By: Harold Barban Entered By: Harold Barban on 08/06/2018 12:04:30 LANDY, MACE ([id number]) -------------------------------------------------------------------------------- Problem List Details Patient Name: LUDELL, ZACARIAS. Date of Service: 08/06/2018 11:00 AM Medical Record Number: [id number] Patient Account Number: [id number] Date of Birth/Sex: 23-Sep-1935 (82 y.o. Female) Treating RN: Montey Hora Primary Care Provider: Fenton Malling Other Clinician: Referring Provider: Fenton Malling Treating  Provider/Extender: Melburn Hake, HOYT Weeks in Treatment: 15 Active Problems ICD-10 Evaluated Encounter Code Description Active Date Today Diagnosis S81.812S Laceration without foreign body, left lower leg, sequela 04/23/2018 No Yes S80.212S Abrasion, left knee, sequela 04/23/2018 No Yes S80.211S Abrasion, right knee, sequela 04/23/2018 No Yes L97.822 Non-pressure chronic ulcer of other part of left lower leg with 05/14/2018 No Yes fat layer exposed Inactive Problems Resolved Problems Electronic Signature(s) Signed: 08/06/2018 9:49:28 PM By: [name] PA-C Entered By: [name] on 08/06/2018 11:39:20 [place], [name] ([id number]) -------------------------------------------------------------------------------- Progress Note Details Patient Name: [name]. Date of Service: 08/06/2018 11:00 AM Medical Record Number: [id number] Patient Account Number: [id number] Date of Birth/Sex: April 29, 1936 (82 y.o. Female) Treating RN: Montey Hora Primary Care Provider: Fenton Malling Other Clinician: Referring Provider: Fenton Malling Treating Provider/Extender: Melburn Hake, HOYT Weeks in Treatment: 15 Subjective Chief Complaint Information obtained from Patient LLE Ulcers History of Present Illness (HPI) 04/23/18-She is seen in initial evaluation for multiple skin tears s/p fall on 8/19. She did sustain a closed fracture of the right patella secondary to that fall. She presented to emergency department on 8/19 for treatment. The left leg laceration was unable to be sutured closed, Steri-Strips were applied and she was treated with knee immobilizer for the patella fracture. She continues on Keflex that was initiated on 8/19. The left leg skin tear flap was cleansed and reapproximated, secure with steri strips. She will follow up next week 04/30/18-She is seen in follow-up evaluation for multiple skin tears. She continues with knee immobilizers to the right lower extremity,  has developed lower extremity edema with subsequent weeping; will initiate ace wrap compression. The flap is mostly devitalized, which was somewhat expected; we will change treatment plan and she will follow up next week 05/07/18-She is seen in follow-up evaluation for multiple wounds. The left lateral lower leg skin flap is nonviable/devitalized, now eschar covered. She tolerated debridement fairly well. The right lower extremity skin tear that was new last week has essentially healed. She has responded nicely to Ace wrap compression. We will modify treatment plan and she will follow-up next week 05/14/18 on evaluation today patient actually appears to be doing a little better in regard to her left lower extremity ulcers. With that being said she has been tolerating the dressing changes without complication. Medihoney is what she was switch to dear in the  last week's visit. Prior to that we used Iodoflex when she had the eschar present. Nonetheless I believe that the Iodoflex may be the best thing for her at this point in regard to the current wound bed and prevention of biofilm buildup. Nonetheless she seems to be tolerating the dressing changes without complication which is good news. It's really the debridement the calls are the most pain last week she did allow for debridement this week she also agreed to allow me to attempt debridement in order to try to help this wound to heal more quickly. 05/21/18 on evaluation today patient actually appears to be doing much better at this time in regard to her left lateral lower Trinity ulcer. She's been tolerating the dressing changes without complication. Fortunately there does not appear to be any evidence of infection at this time. There is still some Slough noted on the surface of the wound although not as much as previous. She continues to have a lot of discomfort currently. She tells me that once we put the Iodoflex on the last time it actually burned  from Thursday till Sunday night and then eased off. Nonetheless it does seem to have made a great improvement in general in regard to the patient's wound bed. 05/28/18 patient's wound bed at this point actually show signs of good improvement which is excellent news currently. She has been tolerating the dressing changes without complication. She does have a little bit of slightly hyper granular tissue at this point there's also some necrotic tissue noted on the surface of the wound. There does not appear to be any evidence of infection overall at this time. In general I feel like that she has made good progress in the past two weeks that I have been seeing her. 06/04/18 on evaluation today patient's left lower Trinity ulcer actually appears to be doing very well today. She has great improvement with excellent epithelialization at this time. Overall I feel like she has made great progress even since last time I seen her. There's really no necrotic tissue on the surface of the wound which is also great news her pain appears to be better. 06/18/18 on evaluation today the patient continues to show signs of improvement in regard to her lower extremity ulcer. She's ELLIOTTE, MARSALIS ([id number]) been tolerating the [facility] Dressing without complication and the wound bed seems to be doing excellent at this time. Overall I'm very pleased with the progress and were things stand. 07/02/18 on evaluation today patient actually appears to be doing excellent in regard to her left lower Trinity ulcer. This is definitely smaller and is shown signs of good improvement. It is going somewhat slow but nonetheless we have a steady progression of the wound which is great news. Overall very pleased with the progress that has been made. She's having very little pain at this time. 07/16/18 on evaluation today patient actually appears to be doing very well in regard to her lower extremity ulcer. She's been tolerating  the dressing changes without complication. Fortunately there is no evidence of anything infection wise occurring at this time. She barely is going to require debridement today. 08/06/18 the on evaluation today patient actually appears to be doing much better in regard to her lower Trinity ulcer. In fact this appears to be very close to complete healing. Fortunately there's no sign of infection at this time. The Hydrofera Blue Dressing to continues to stick somewhat to the wound bed unfortunately. Patient History Information obtained from  Patient. Family History Cancer - Mother, Lung Disease - Siblings, No family history of Diabetes, Heart Disease, Hereditary Spherocytosis, Hypertension, Kidney Disease, Seizures, Stroke, Thyroid Problems, Tuberculosis. Social History Never smoker, Marital Status - Married, Alcohol Use - Never, Drug Use - No History, Caffeine Use - Daily. Medical And Surgical History Notes Respiratory pulmonary fibrosis, home O2 at night and PRN Gastrointestinal diverticulosis Review of Systems (ROS) Constitutional Symptoms (General Health) Denies complaints or symptoms of Fever, Chills. Respiratory The patient has no complaints or symptoms. Cardiovascular The patient has no complaints or symptoms. Psychiatric The patient has no complaints or symptoms. Objective Constitutional Well-nourished and well-hydrated in no acute distress. Vitals Time Taken: 11:32 AM, Height: 60 in, Weight: 151 lbs, BMI: 29.5, Temperature: 98 F, Pulse: 59 bpm, Respiratory Rate: 16 breaths/min, Blood Pressure: 144/56 mmHg. ERSA, DELANEY ([id number]) Respiratory normal breathing without difficulty. clear to auscultation bilaterally. Cardiovascular regular rate and rhythm with normal S1, S2. Psychiatric this patient is able to make decisions and demonstrates good insight into disease process. Alert and Oriented x 3. pleasant and cooperative. General Notes: Patient's wound currently as  shown signs of excellent improvement which is great news. Overall I'm extremely pleased with where things stand at this point. Integumentary (Hair, Skin) Wound #3 status is Open. Original cause of wound was Trauma. The wound is located on the Left,Lateral Lower Leg. The wound measures 0.6cm length x 1.2cm width x 0.1cm depth; 0.565cm^2 area and 0.057cm^3 volume. There is Fat Layer (Subcutaneous Tissue) Exposed exposed. There is no tunneling or undermining noted. There is a small amount of serosanguineous drainage noted. The wound margin is flat and intact. There is large (67-100%) red, hyper - granulation within the wound bed. There is a small (1-33%) amount of necrotic tissue within the wound bed including Eschar. The periwound skin appearance did not exhibit: Callus, Crepitus, Excoriation, Induration, Rash, Scarring, Dry/Scaly, Maceration, Atrophie Blanche, Cyanosis, Ecchymosis, Hemosiderin Staining, Mottled, Pallor, Rubor, Erythema. Periwound temperature was noted as No Abnormality. The periwound has tenderness on palpation. Assessment Active Problems ICD-10 Laceration without foreign body, left lower leg, sequela Abrasion, left knee, sequela Abrasion, right knee, sequela Non-pressure chronic ulcer of other part of left lower leg with fat layer exposed Procedures Wound #3 Pre-procedure diagnosis of Wound #3 is a Skin Tear located on the Left,Lateral Lower Leg . There was a Excisional Skin/Subcutaneous Tissue Debridement with a total area of 0.72 sq cm performed by STONE III, HOYT E., PA-C. With the following instrument(s): Forceps, and Scissors to remove Viable tissue/material. Material removed includes Subcutaneous Tissue and Skin: Epidermis and after achieving pain control using Lidocaine 4% Topical Solution. No specimens were taken. A time out was conducted at 11:58, prior to the start of the procedure. There was no bleeding. The procedure was tolerated well with a pain level of 0  throughout and a pain level of 0 following the procedure. Post Debridement Measurements: 0.6cm length x 1.2cm width x 0.1cm depth; 0.057cm^3 volume. Character of Wound/Ulcer Post Debridement is improved. Post procedure Diagnosis Wound #3: Same as Pre-Procedure AIMI, ESSNER ([id number]) Plan Wound Cleansing: Wound #3 Left,Lateral Lower Leg: Clean wound with Normal Saline. Anesthetic (add to Medication List): Wound #3 Left,Lateral Lower Leg: Topical Lidocaine 4% cream applied to wound bed prior to debridement (In Clinic Only). Primary Wound Dressing: Wound #3 Left,Lateral Lower Leg: Hydrafera Blue Ready Transfer - Add adaptic to wound bed before hydrafera blue, to protect. Secondary Dressing: Wound #3 Left,Lateral Lower Leg: ABD and Kerlix/Conform - ace wrap Dressing  Change Frequency: Wound #3 Left,Lateral Lower Leg: Three times weekly - [facility] Tuesday and Saturdays dressing changes. Patient comes to clinic on Thursdays. Follow-up Appointments: Wound #3 Left,Lateral Lower Leg: Return Appointment in 2 weeks. Edema Control: Wound #3 Left,Lateral Lower Leg: Elevate legs to the level of the heart and pump ankles as often as possible Other: - ACE wrap Additional Orders / Instructions: Wound #3 Left,Lateral Lower Leg: Vitamin A; Vitamin C, Zinc - Please add a multivitamin that has 100% of vitamin A, vitamin C and zinc supplements in it Increase protein intake. Activity as tolerated Home Health: Wound #3 Left,Lateral Lower Leg: [place] Nurse may visit PRN to address patient s wound care needs. FACE TO FACE ENCOUNTER: MEDICARE and MEDICAID PATIENTS: I certify that this patient is under my care and that I had a face-to-face encounter that meets the physician face-to-face encounter requirements with this patient on this date. The encounter with the patient was in whole or in part for the following MEDICAL CONDITION: (primary reason for [place])  MEDICAL NECESSITY: I certify, that based on my findings, NURSING services are a medically necessary home health service. HOME BOUND STATUS: I certify that my clinical findings support that this patient is homebound (i.e., Due to illness or injury, pt requires aid of supportive devices such as crutches, cane, wheelchairs, walkers, the use of special transportation or the assistance of another person to leave their place of residence. There is a normal inability to leave the home and doing so requires considerable and taxing effort. Other absences are for medical reasons / religious services and are infrequent or of short duration when for other reasons). If current dressing causes regression in wound condition, may D/C ordered dressing product/s and apply Normal Saline Moist Dressing daily until next [place] / Other MD appointment. [place] of regression in wound condition at [phone]. Please direct any NON-WOUND related issues/requests for orders to patient's Primary Care Physician I'm gonna recommend that we continue with the above wound care measures for the next week and the patient is in agreement with this plan. We will subsequently see were things stand at follow-up. Please see above for specific wound care orders. We will see patient for re-evaluation in 1 week(s) here in the clinic. If anything worsens or changes patient will contact our office for additional recommendations. CAI, FLOTT ([id number]) Electronic Signature(s) Signed: 08/06/2018 9:49:28 PM By: [name] PA-C Entered By: [name] on 08/06/2018 12:49:41 AREIL, OTTEY ([id number]) -------------------------------------------------------------------------------- ROS/PFSH Details Patient Name: SHAMEEKA, SILLIMAN. Date of Service: 08/06/2018 11:00 AM Medical Record Number: [id number] Patient Account Number: [id number] Date of Birth/Sex: Nov 22, 1935 (82 y.o.  Female) Treating RN: Montey Hora Primary Care Provider: Fenton Malling Other Clinician: Referring Provider: Fenton Malling Treating Provider/Extender: Melburn Hake, HOYT Weeks in Treatment: 15 Information Obtained From Patient Wound History Do you currently have one or more open woundso Yes How many open wounds do you currently haveo 2 Approximately how long have you had your woundso 4 days How have you been treating your wound(s) until nowo bandage from the ER Has your wound(s) ever healed and then re-openedo No Have you had any lab work done in the past montho Yes Who ordered the lab work [facility] Have you tested positive for an antibiotic resistant organism (MRSA, VRE)o No Have you tested positive for osteomyelitis (bone infection)o No Have you had any tests for circulation on your legso No Have you  had other problems associated with your woundso Swelling Constitutional Symptoms (General Health) Complaints and Symptoms: Negative for: Fever; Chills Eyes Medical History: Negative for: Cataracts; Glaucoma; Optic Neuritis Ear/Nose/Mouth/Throat Medical History: Negative for: Chronic sinus problems/congestion; Middle ear problems Hematologic/Lymphatic Medical History: Negative for: Anemia; Hemophilia; Human Immunodeficiency Virus; Lymphedema; Sickle Cell Disease Respiratory Complaints and Symptoms: No Complaints or Symptoms Medical History: Negative for: Aspiration; Asthma; Chronic Obstructive Pulmonary Disease (COPD); Pneumothorax; Sleep Apnea; Tuberculosis Past Medical History Notes: pulmonary fibrosis, home O2 at night and PRN Cardiovascular BRYANN, GENTZ ([id number]) Complaints and Symptoms: No Complaints or Symptoms Medical History: Positive for: Hypertension Negative for: Angina; Arrhythmia; Congestive Heart Failure; Coronary Artery Disease; Deep Vein Thrombosis; Hypotension; Myocardial Infarction; Peripheral Arterial Disease; Peripheral Venous  Disease; Phlebitis; Vasculitis Gastrointestinal Medical History: Negative for: Cirrhosis ; Colitis; Crohnos; Hepatitis A; Hepatitis B; Hepatitis C Past Medical History Notes: diverticulosis Endocrine Medical History: Negative for: Type I Diabetes; Type II Diabetes Genitourinary Medical History: Negative for: End Stage Renal Disease Immunological Medical History: Negative for: Lupus Erythematosus; Raynaudos; Scleroderma Integumentary (Skin) Medical History: Negative for: History of Burn; History of pressure wounds Musculoskeletal Medical History: Positive for: Osteoarthritis Negative for: Gout; Rheumatoid Arthritis; Osteomyelitis Neurologic Medical History: Negative for: Dementia; Neuropathy; Quadriplegia; Paraplegia; Seizure Disorder Oncologic Medical History: Negative for: Received Chemotherapy; Received Radiation Psychiatric Complaints and Symptoms: No Complaints or Symptoms Medical History: Negative for: Anorexia/bulimia; Confinement Anxiety DARLYN, REPSHER. ([id number]) Immunizations Pneumococcal Vaccine: Received Pneumococcal Vaccination: Yes Implantable Devices Family and Social History Cancer: Yes - Mother; Diabetes: No; Heart Disease: No; Hereditary Spherocytosis: No; Hypertension: No; Kidney Disease: No; Lung Disease: Yes - Siblings; Seizures: No; Stroke: No; Thyroid Problems: No; Tuberculosis: No; Never smoker; Marital Status - Married; Alcohol Use: Never; Drug Use: No History; Caffeine Use: Daily; Financial Concerns: No; Food, Clothing or Shelter Needs: No; Support System Lacking: No; Transportation Concerns: No; Advanced Directives: No; Patient does not want information on Advanced Directives Physician Affirmation I have reviewed and agree with the above information. Electronic Signature(s) Signed: 08/06/2018 5:28:33 PM By: Montey Hora Signed: 08/06/2018 9:49:28 PM By: [name] PA-C Entered By: [name] on 08/06/2018 12:48:21 MILTA, CROSON ([id number]) -------------------------------------------------------------------------------- SuperBill Details Patient Name: CASEY, MAXFIELD. Date of Service: 08/06/2018 Medical Record Number: [id number] Patient Account Number: [id number] Date of Birth/Sex: [birth year] (82 y.o. Female) Treating RN: Montey Hora Primary Care Provider: Fenton Malling Other Clinician: Referring Provider: Fenton Malling Treating Provider/Extender: Melburn Hake, HOYT Weeks in Treatment: 15 Diagnosis Coding ICD-10 Codes Code Description [phone] Laceration without foreign body, left lower leg, sequela S80.212S Abrasion, left knee, sequela S80.211S Abrasion, right knee, sequela L97.822 Non-pressure chronic ulcer of other part of left lower leg with fat layer exposed Facility Procedures CPT4 Code Description: 80881103 11042 - DEB SUBQ TISSUE 20 SQ CM/< ICD-10 Diagnosis Description L97.822 Non-pressure chronic ulcer of other part of left lower leg with Modifier: fat layer expos Quantity: 1 ed Physician Procedures CPT4 Code Description: 1594585 92924 - WC PHYS SUBQ TISS 20 SQ CM ICD-10 Diagnosis Description L97.822 Non-pressure chronic ulcer of other part of left lower leg with Modifier: fat layer expos Quantity: 1 ed Electronic Signature(s) Signed: 08/06/2018 9:49:28 PM By: [name] PA-C Entered By: [name] on 08/06/2018 12:50:43

## 2018-08-13 NOTE — Progress Notes (Signed)
Color: Atrophie Blanche: No N/A N/A Cyanosis: No Ecchymosis: No Erythema: No Hemosiderin Staining: No Mottled: No Pallor: No Rubor: No Temperature: No Abnormality N/A N/A Tenderness on Palpation: Yes N/A N/A Wound Preparation: Ulcer Cleansing: N/A N/A Rinsed/Irrigated with Saline Topical Anesthetic Applied: Other: lidocaine 4% Treatment Notes Electronic Signature(s) Signed: 08/11/2018 5:05:35 PM By: Harold Barban Entered By: Harold Barban on 08/06/2018 11:58:40 [name], [name] ([id number]) -------------------------------------------------------------------------------- [place] Details Patient Name: [name], [name]. Date of Service: 08/06/2018 11:00 AM Medical Record Number: [id number] Patient Account Number: [id number] Date of Birth/Sex: 17-Jan-1936 (82 y.o. Female) Treating RN: Harold Barban Primary Care [name]: [name] Other Clinician: Referring [name]: [name] Treating [name]/Extender: [name], [name] in Treatment: 15 Active Inactive Abuse / Safety / Falls / Self Care Management Nursing Diagnoses: Impaired physical mobility Goals: Patient  will remain injury free related to falls Date Initiated: 04/23/2018 Target Resolution Date: 07/03/2018 Goal Status: Active Interventions: Assess fall risk on admission and as needed Notes: Orientation to the Wound Care Program Nursing Diagnoses: Knowledge deficit related to the wound healing center program Goals: Patient/caregiver will verbalize understanding of the [place] Program Date Initiated: 04/23/2018 Target Resolution Date: 07/03/2018 Goal Status: Active Interventions: Provide education on orientation to the wound center Notes: Pain, Acute or Chronic Nursing Diagnoses: Pain, acute or chronic: actual or potential Goals: Patient/caregiver will verbalize adequate pain control between visits Date Initiated: 04/23/2018 Target Resolution Date: 07/03/2018 Goal Status: Active Interventions: Assess comfort goal upon admission [name], [name] ([id number]) Notes: Wound/Skin Impairment Nursing Diagnoses: Impaired tissue integrity Goals: Ulcer/skin breakdown will heal within 14 [name] Date Initiated: 04/23/2018 Target Resolution Date: 07/03/2018 Goal Status: Active Interventions: Assess patient/caregiver ability to obtain necessary supplies Assess patient/caregiver ability to perform ulcer/skin care regimen upon admission and as needed Assess ulceration(s) every visit Notes: Electronic Signature(s) Signed: 08/11/2018 5:05:35 PM By: Harold Barban Entered By: Harold Barban on 08/06/2018 11:58:33 [name], [name] ([id number]) -------------------------------------------------------------------------------- Pain Assessment Details Patient Name: [name]. Date of Service: 08/06/2018 11:00 AM Medical Record Number: [id number] Patient Account Number: [id number] Date of Birth/Sex: Jun 25, 1936 (82 y.o. Female) Treating RN: [name] Primary Care Janaiya Beauchesne: [name] Other Clinician: Referring Sumedh Shinsato: [name] Treating Anais Koenen/Extender:  [name], [name] in Treatment: 15 Active Problems Location of Pain Severity and Description of Pain Patient Has Paino No Site Locations Pain Management and Medication Current Pain Management: Electronic Signature(s) Signed: 08/06/2018 5:33:18 PM By: [name], BSN, RN, CWS, [name] Entered By: [name], BSN, RN, CWS, Terri on 08/06/2018 11:32:12 [name], [name] ([id number]) -------------------------------------------------------------------------------- Patient/Caregiver Education Details Patient Name: [name], [name] Date of Service: 08/06/2018 11:00 AM Medical Record Number: [id number] Patient Account Number: [id number] Date of Birth/Gender: 1936-07-24 (82 y.o. Female) Treating RN: Harold Barban Primary Care Physician: [name] Other Clinician: Referring Physician: Fenton [name] Treating Physician/Extender: [name] in Treatment: 15 Education Assessment Education Provided To: Patient Education Topics Provided Electronic Signature(s) Signed: 08/11/2018 5:05:35 PM By: Harold Barban Entered By: Harold Barban on 08/06/2018 11:58:52 [name], [name] ([id number]) -------------------------------------------------------------------------------- Wound Assessment Details Patient Name: [name], [name]. Date of Service: 08/06/2018 11:00 AM Medical Record Number: [id number] Patient Account Number: [id number] Date of Birth/Sex: [birth year] (82 y.o. Female) Treating RN: [name] Primary Care [name]: [name] Other Clinician: Referring [name]: [name] Treating [name]/Extender: [name], [name] in Treatment: 15 Wound Status Wound Number: 3 Primary Etiology: Skin Tear Wound Location: Left Lower Leg - Lateral Wound Status: Open Wounding Event: Trauma Comorbid History: Hypertension,  [name] ([id number]) Visit Report for 08/06/2018 Arrival Information Details Patient Name: [name], [name]. Date of Service: 08/06/2018 11:00 AM Medical Record Number: [id number] Patient Account Number: [id number] Date of Birth/Sex: [birth year] (82 y.o. Female) Treating RN: [name] Primary Care Clessie Karras: [name] Other Clinician: Referring [name]: [name] Treating [name]/Extender: [name], [name] in Treatment: 15 Visit Information History Since Last Visit Added or deleted any medications: No Patient Arrived: Ambulatory Any new allergies or adverse reactions: No Arrival Time: 11:29 Had a fall or experienced change in No Accompanied By: self activities of daily living that may affect Transfer Assistance: None risk of falls: Patient Identification Verified: Yes Signs or symptoms of abuse/neglect since last visito No Secondary Verification Process Completed: Yes Implantable device outside of the clinic excluding No cellular tissue based products placed in the center since last visit: Has Dressing in Place as Prescribed: Yes Pain Present Now: No Electronic Signature(s) Signed: 08/06/2018 5:33:18 PM By: [name], BSN, RN, CWS, [name] Entered By: [name], BSN, RN, CWS, Terri on 08/06/2018 11:29:33 [name] ([id number]) -------------------------------------------------------------------------------- Lower Extremity Assessment Details Patient Name: [name], [name]. Date of Service: 08/06/2018 11:00 AM Medical Record Number: [id number] Patient Account Number: [id number] Date of Birth/Sex: 06/14/1936 (82 y.o. Female) Treating RN: [name] Primary Care Ksean Vale: [name] Other Clinician: Referring Tayia Stonesifer: [name] Treating Danetta Prom/Extender: [name], [name] in Treatment: 15 Edema Assessment Assessed: [Left: No] [Right: No] [Left: Edema] [Right: :] Calf Left: Right: Point of Measurement: 30 cm From  Medial Instep 30 cm cm Ankle Left: Right: Point of Measurement: 10 cm From Medial Instep 18.6 cm cm Vascular Assessment Pulses: Dorsalis Pedis Palpable: [Left:Yes] Posterior Tibial Extremity colors, hair growth, and conditions: Extremity Color: [Left:Normal] Hair Growth on Extremity: [Left:Yes] Temperature of Extremity: [Left:Warm] Capillary Refill: [Left:< 3 seconds] Toe Nail Assessment Left: Right: Thick: No Discolored: No Deformed: No Improper Length and Hygiene: No Electronic Signature(s) Signed: 08/06/2018 5:33:18 PM By: [name], BSN, RN, CWS, [name] Entered By: [name], BSN, RN, CWS, Terri on 08/06/2018 11:40:41 [name], [name] ([id number]) -------------------------------------------------------------------------------- Multi Wound Chart Details Patient Name: [name]. Date of Service: 08/06/2018 11:00 AM Medical Record Number: [id number] Patient Account Number: [id number] Date of Birth/Sex: Mar 13, 1936 (82 y.o. Female) Treating RN: Harold Barban Primary Care Ladarien Beeks: [name] Other Clinician: Referring Damarion Mendizabal: [name] Treating Maddelynn Moosman/Extender: [name], [name] in Treatment: 15 Vital Signs Height(in): 60 Pulse(bpm): 70 Weight(lbs): 151 Blood Pressure(mmHg): 144/56 Body Mass Index(BMI): 29 Temperature(F): 98 Respiratory Rate 16 (breaths/min): Photos: [3:No Photos] [N/A:N/A] Wound Location: [3:Left Lower Leg - Lateral] [N/A:N/A] Wounding Event: [3:Trauma] [N/A:N/A] Primary Etiology: [3:Skin Tear] [N/A:N/A] Comorbid History: [3:Hypertension, Osteoarthritis] [N/A:N/A] Date Acquired: [3:04/20/2018] [N/A:N/A] [name] of Treatment: [3:15] [N/A:N/A] Wound Status: [3:Open] [N/A:N/A] Measurements L x W x D [3:0.6x1.2x0.1] [N/A:N/A] (cm) Area (cm) : [3:0.565] [N/A:N/A] Volume (cm) : [3:0.057] [N/A:N/A] % Reduction in Area: [3:99.50%] [N/A:N/A] % Reduction in Volume: [3:99.70%] [N/A:N/A] Classification: [3:Full Thickness  Without Exposed Support Structures] [N/A:N/A] Exudate Amount: [3:Small] [N/A:N/A] Exudate Type: [3:Serosanguineous] [N/A:N/A] Exudate Color: [3:red, brown] [N/A:N/A] Wound Margin: [3:Flat and Intact] [N/A:N/A] Granulation Amount: [3:Large (67-100%)] [N/A:N/A] Granulation Quality: [3:Red, Hyper-granulation] [N/A:N/A] Necrotic Amount: [3:Small (1-33%)] [N/A:N/A] Necrotic Tissue: [3:Eschar] [N/A:N/A] Exposed Structures: [3:Fat Layer (Subcutaneous Tissue) Exposed: Yes Fascia: No Tendon: No Muscle: No Joint: No Bone: No] [N/A:N/A] Epithelialization: [3:Large (67-100%)] [N/A:N/A] Periwound Skin Texture: [3:Excoriation: No Induration: No Callus: No Crepitus: No] [N/A:N/A] Rash: No Scarring: No Periwound Skin Moisture: Maceration: No N/A N/A Dry/Scaly: No Periwound Skin  Osteoarthritis Date Acquired: 04/20/2018 [name] Of Treatment: 15 Clustered Wound: No Photos Photo Uploaded By: [name], BSN, RN, CWS,  Terri on 08/06/2018 13:06:14 Wound Measurements Length: (cm) 0.6 Width: (cm) 1.2 Depth: (cm) 0.1 Area: (cm) 0.565 Volume: (cm) 0.057 % Reduction in Area: 99.5% % Reduction in Volume: 99.7% Epithelialization: Large (67-100%) Tunneling: No Undermining: No Wound Description Full Thickness Without Exposed Support Foul Odo Classification: Structures Slough/F Wound Margin: Flat and Intact Exudate Small Amount: Exudate Type: Serosanguineous Exudate Color: red, brown r After Cleansing: No ibrino Yes Wound Bed Granulation Amount: Large (67-100%) Exposed Structure Granulation Quality: Red, Hyper-granulation Fascia Exposed: No Necrotic Amount: Small (1-33%) Fat Layer (Subcutaneous Tissue) Exposed: Yes Necrotic Quality: Eschar Tendon Exposed: No Muscle Exposed: No Joint Exposed: No Bone Exposed: No Tamburo, Tuwanna J. ([id number]) Periwound Skin Texture Texture Color No Abnormalities Noted: No No Abnormalities Noted: No Callus: No Atrophie Blanche: No Crepitus: No Cyanosis: No Excoriation: No Ecchymosis: No Induration: No Erythema: No Rash: No Hemosiderin Staining: No Scarring: No Mottled: No Pallor: No Moisture Rubor: No No Abnormalities Noted: No Dry / Scaly: No Temperature / Pain Maceration: No Temperature: No Abnormality Tenderness on Palpation: Yes Wound Preparation Ulcer Cleansing: Rinsed/Irrigated with Saline Topical Anesthetic Applied: Other: lidocaine 4%, Electronic Signature(s) Signed: 08/06/2018 5:33:18 PM By: [name], BSN, RN, CWS, [name] Entered By: [name], BSN, RN, CWS, Terri on 08/06/2018 11:38:54 [name] ([id number]) -------------------------------------------------------------------------------- Vitals Details Patient Name: LYNNDA, WIERSMA. Date of Service: 08/06/2018 11:00 AM Medical Record Number: [id number] Patient Account Number: [id number] Date of Birth/Sex: 1936/08/15 (82 y.o. Female) Treating RN: [name] Primary Care  Maxi Rodas: [name] Other Clinician: Referring Eugune Sine: [name] Treating Krayton Wortley/Extender: [name], [name] in Treatment: 15 Vital Signs Time Taken: 11:32 Temperature (F): 98 Height (in): 60 Pulse (bpm): 59 Weight (lbs): 151 Respiratory Rate (breaths/min): 16 Body Mass Index (BMI): 29.5 Blood Pressure (mmHg): 144/56 Reference Range: 80 - 120 mg / dl Electronic Signature(s) Signed: 08/06/2018 5:33:18 PM By: [name], BSN, RN, CWS, [name] Entered By: [name], BSN, RN, CWS, Terri on 08/06/2018 11:32:47

## 2018-08-15 DIAGNOSIS — Z9181 History of falling: Secondary | ICD-10-CM | POA: Diagnosis not present

## 2018-08-15 DIAGNOSIS — M150 Primary generalized (osteo)arthritis: Secondary | ICD-10-CM | POA: Diagnosis not present

## 2018-08-15 DIAGNOSIS — M4856XS Collapsed vertebra, not elsewhere classified, lumbar region, sequela of fracture: Secondary | ICD-10-CM | POA: Diagnosis not present

## 2018-08-15 DIAGNOSIS — S81812D Laceration without foreign body, left lower leg, subsequent encounter: Secondary | ICD-10-CM | POA: Diagnosis not present

## 2018-08-15 DIAGNOSIS — J84112 Idiopathic pulmonary fibrosis: Secondary | ICD-10-CM | POA: Diagnosis not present

## 2018-08-15 DIAGNOSIS — I10 Essential (primary) hypertension: Secondary | ICD-10-CM | POA: Diagnosis not present

## 2018-08-17 DIAGNOSIS — M5416 Radiculopathy, lumbar region: Secondary | ICD-10-CM | POA: Diagnosis not present

## 2018-08-17 DIAGNOSIS — M6281 Muscle weakness (generalized): Secondary | ICD-10-CM | POA: Diagnosis not present

## 2018-08-18 DIAGNOSIS — I10 Essential (primary) hypertension: Secondary | ICD-10-CM | POA: Diagnosis not present

## 2018-08-18 DIAGNOSIS — S81812D Laceration without foreign body, left lower leg, subsequent encounter: Secondary | ICD-10-CM | POA: Diagnosis not present

## 2018-08-18 DIAGNOSIS — M4856XS Collapsed vertebra, not elsewhere classified, lumbar region, sequela of fracture: Secondary | ICD-10-CM | POA: Diagnosis not present

## 2018-08-18 DIAGNOSIS — Z9181 History of falling: Secondary | ICD-10-CM | POA: Diagnosis not present

## 2018-08-18 DIAGNOSIS — M150 Primary generalized (osteo)arthritis: Secondary | ICD-10-CM | POA: Diagnosis not present

## 2018-08-18 DIAGNOSIS — J84112 Idiopathic pulmonary fibrosis: Secondary | ICD-10-CM | POA: Diagnosis not present

## 2018-08-19 DIAGNOSIS — M47817 Spondylosis without myelopathy or radiculopathy, lumbosacral region: Secondary | ICD-10-CM | POA: Diagnosis not present

## 2018-08-20 ENCOUNTER — Encounter: Payer: Medicare Other | Admitting: Physician Assistant

## 2018-08-20 DIAGNOSIS — I10 Essential (primary) hypertension: Secondary | ICD-10-CM | POA: Diagnosis not present

## 2018-08-20 DIAGNOSIS — L97822 Non-pressure chronic ulcer of other part of left lower leg with fat layer exposed: Secondary | ICD-10-CM | POA: Diagnosis not present

## 2018-08-20 DIAGNOSIS — M1990 Unspecified osteoarthritis, unspecified site: Secondary | ICD-10-CM | POA: Diagnosis not present

## 2018-08-20 DIAGNOSIS — Z79899 Other long term (current) drug therapy: Secondary | ICD-10-CM | POA: Diagnosis not present

## 2018-08-22 DIAGNOSIS — S81812D Laceration without foreign body, left lower leg, subsequent encounter: Secondary | ICD-10-CM | POA: Diagnosis not present

## 2018-08-22 DIAGNOSIS — M4856XS Collapsed vertebra, not elsewhere classified, lumbar region, sequela of fracture: Secondary | ICD-10-CM | POA: Diagnosis not present

## 2018-08-22 DIAGNOSIS — M150 Primary generalized (osteo)arthritis: Secondary | ICD-10-CM | POA: Diagnosis not present

## 2018-08-22 DIAGNOSIS — I10 Essential (primary) hypertension: Secondary | ICD-10-CM | POA: Diagnosis not present

## 2018-08-22 DIAGNOSIS — Z9181 History of falling: Secondary | ICD-10-CM | POA: Diagnosis not present

## 2018-08-22 DIAGNOSIS — J84112 Idiopathic pulmonary fibrosis: Secondary | ICD-10-CM | POA: Diagnosis not present

## 2018-08-23 NOTE — Progress Notes (Signed)
ROCKELLE, HEUERMAN ([id number]) Visit Report for 08/20/2018 Chief Complaint Document Details Patient Name: [name], [name]. Date of Service: 08/20/2018 10:00 AM Medical Record Number: [id number] Patient Account Number: [id number] Date of Birth/Sex: [birth year] (82 y.o. F) Treating RN: Primary Care Provider: Fenton Malling Other Clinician: Referring Provider: Fenton Malling Treating Provider/Extender: Melburn Hake, HOYT Weeks in Treatment: 17 Information Obtained from: Patient Chief Complaint LLE Ulcers Electronic Signature(s) Signed: 08/20/2018 5:48:37 PM By: [name] PA-C Entered By: [name] on 08/20/2018 10:18:55 [name], [name] ([id number]) -------------------------------------------------------------------------------- HPI Details Patient Name: CRISELDA, STARKE Date of Service: 08/20/2018 10:00 AM Medical Record Number: [id number] Patient Account Number: [id number] Date of Birth/Sex: 05/23/1936 (82 y.o. F) Treating RN: Primary Care Provider: Fenton Malling Other Clinician: Referring Provider: Fenton Malling Treating Provider/Extender: Melburn Hake, HOYT Weeks in Treatment: 17 History of Present Illness HPI Description: 04/23/18-She is seen in initial evaluation for multiple skin tears s/p fall on 8/19. She did sustain a closed fracture of the right patella secondary to that fall. She presented to emergency department on 8/19 for treatment. The left leg laceration was unable to be sutured closed, Steri-Strips were applied and she was treated with knee immobilizer for the patella fracture. She continues on Keflex that was initiated on 8/19. The left leg skin tear flap was cleansed and reapproximated, secure with steri strips. She will follow up next week 04/30/18-She is seen in follow-up evaluation for multiple skin tears. She continues with knee immobilizers to the right lower extremity, has developed lower extremity edema with subsequent weeping; will  initiate ace wrap compression. The flap is mostly devitalized, which was somewhat expected; we will change treatment plan and she will follow up next week 05/07/18-She is seen in follow-up evaluation for multiple wounds. The left lateral lower leg skin flap is nonviable/devitalized, now eschar covered. She tolerated debridement fairly well. The right lower extremity skin tear that was new last week has essentially healed. She has responded nicely to Ace wrap compression. We will modify treatment plan and she will follow-up next week 05/14/18 on evaluation today patient actually appears to be doing a little better in regard to her left lower extremity ulcers. With that being said she has been tolerating the dressing changes without complication. Medihoney is what she was switch to dear in the last week's visit. Prior to that we used Iodoflex when she had the eschar present. Nonetheless I believe that the Iodoflex may be the best thing for her at this point in regard to the current wound bed and prevention of biofilm buildup. Nonetheless she seems to be tolerating the dressing changes without complication which is good news. It's really the debridement the calls are the most pain last week she did allow for debridement this week she also agreed to allow me to attempt debridement in order to try to help this wound to heal more quickly. 05/21/18 on evaluation today patient actually appears to be doing much better at this time in regard to her left lateral lower Trinity ulcer. She's been tolerating the dressing changes without complication. Fortunately there does not appear to be any evidence of infection at this time. There is still some Slough noted on the surface of the wound although not as much as previous. She continues to have a lot of discomfort currently. She tells me that once we put the Iodoflex on the last time it actually burned from Thursday till Sunday night and then eased off. Nonetheless it  does seem to have made  a great improvement in general in regard to the patient's wound bed. 05/28/18 patient's wound bed at this point actually show signs of good improvement which is excellent news currently. She has been tolerating the dressing changes without complication. She does have a little bit of slightly hyper granular tissue at this point there's also some necrotic tissue noted on the surface of the wound. There does not appear to be any evidence of infection overall at this time. In general I feel like that she has made good progress in the past two weeks that I have been seeing her. 06/04/18 on evaluation today patient's left lower Trinity ulcer actually appears to be doing very well today. She has great improvement with excellent epithelialization at this time. Overall I feel like she has made great progress even since last time I seen her. There's really no necrotic tissue on the surface of the wound which is also great news her pain appears to be better. 06/18/18 on evaluation today the patient continues to show signs of improvement in regard to her lower extremity ulcer. She's been tolerating the Hydrofera Blue Dressing without complication and the wound bed seems to be doing excellent at this time. Overall I'm very pleased with the progress and were things stand. 07/02/18 on evaluation today patient actually appears to be doing excellent in regard to her left lower Trinity ulcer. This is definitely smaller and is shown signs of good improvement. It is going somewhat slow but nonetheless we have a steady progression of the wound which is great news. Overall very pleased with the progress that has been made. She's having very [name], [name]. ([id number]) little pain at this time. 07/16/18 on evaluation today patient actually appears to be doing very well in regard to her lower extremity ulcer. She's been tolerating the dressing changes without complication. Fortunately there is no  evidence of anything infection wise occurring at this time. She barely is going to require debridement today. 08/06/18 the on evaluation today patient actually appears to be doing much better in regard to her lower Trinity ulcer. In fact this appears to be very close to complete healing. Fortunately there's no sign of infection at this time. The Hydrofera Blue Dressing to continues to stick somewhat to the wound bed unfortunately. 08/20/18 on evaluation today patient actually appears to be doing very well in regard to the left lower extremity ulcer. She has been tolerating the dressing changes without complications. She tolerates this very well. With that being said I do not see any evidence of infection at this time. Overall I think she is making wonderful progress is very close to complete healing. Electronic Signature(s) Signed: 08/20/2018 5:48:37 PM By: [name] PA-C Entered By: [name] on 08/20/2018 10:32:08 [name], [name] ([id number]) -------------------------------------------------------------------------------- Physical Exam Details Patient Name: [name], [name]. Date of Service: 08/20/2018 10:00 AM Medical Record Number: [id number] Patient Account Number: [id number] Date of Birth/Sex: 28-Apr-1936 (82 y.o. F) Treating RN: Primary Care Provider: Fenton Malling Other Clinician: Referring Provider: Fenton Malling Treating Provider/Extender: Melburn Hake, HOYT Weeks in Treatment: 28 Constitutional Well-nourished and well-hydrated in no acute distress. Respiratory normal breathing without difficulty. clear to auscultation bilaterally. Cardiovascular regular rate and rhythm with normal S1, S2. Psychiatric this patient is able to make decisions and demonstrates good insight into disease process. Alert and Oriented x 3. pleasant and cooperative. Notes Patient's wound bed currently shows evidence of good granulation at this time. She did have some dry skin  around the  edge of the wound as well that scar which was mechanically debrided away with saline and gauze she tolerated this without complication. Post debridement this seems to be doing much better. No sharp debridement was required today. Electronic Signature(s) Signed: 08/20/2018 5:48:37 PM By: [name] PA-C Entered By: [name] on 08/20/2018 10:33:43 [name], [name] ([id number]) -------------------------------------------------------------------------------- Physician Orders Details Patient Name: LAIAH, POUNCEY. Date of Service: 08/20/2018 10:00 AM Medical Record Number: [id number] Patient Account Number: [id number] Date of Birth/Sex: July 30, 1936 (82 y.o. F) Treating RN: Montey Hora Primary Care Provider: Fenton Malling Other Clinician: Referring Provider: Fenton Malling Treating Provider/Extender: Melburn Hake, HOYT Weeks in Treatment: 56 Verbal / Phone Orders: No Diagnosis Coding ICD-10 Coding Code Description [phone] Laceration without foreign body, left lower leg, sequela S80.212S Abrasion, left knee, sequela S80.211S Abrasion, right knee, sequela L97.822 Non-pressure chronic ulcer of other part of left lower leg with fat layer exposed Wound Cleansing Wound #3 Left,Lateral Lower Leg o Clean wound with Normal Saline. o May Shower, gently pat wound dry prior to applying new dressing. - use Dial antimicrobial soap to wash wound Anesthetic (add to Medication List) Wound #3 Left,Lateral Lower Leg o Topical Lidocaine 4% cream applied to wound bed prior to debridement (In Clinic Only). Primary Wound Dressing Wound #3 Left,Lateral Lower Leg o Hydrafera Blue Ready Transfer - Add adaptic to wound bed before hydrafera blue, to protect. Secondary Dressing Wound #3 Left,Lateral Lower Leg o ABD and Kerlix/Conform - ace wrap Dressing Change Frequency Wound #3 Left,Lateral Lower Leg o Three times weekly - [facility] Tuesday and Saturdays dressing  changes. Patient comes to clinic on Thursdays. Follow-up Appointments Wound #3 Left,Lateral Lower Leg o Return Appointment in 2 weeks. - HHRN to visit patient 3 times weekly when she does not come into [facility] and twice weekly when she does come into clinic Edema Control Wound #3 Left,Lateral Lower Leg o Elevate legs to the level of the heart and pump ankles as often as possible o Other: - ACE wrap Additional Orders / Instructions Wound #3 Left,Lateral Lower Leg [name], VENT. ([id number]) o Vitamin A; Vitamin C, Zinc - Please add a multivitamin that has 100% of vitamin A, vitamin C and zinc supplements in it o Increase protein intake. o Activity as tolerated Home Health Wound #3 Left,Lateral Lower Leg o [employer] Nurse may visit PRN to address patientos wound care needs. o FACE TO FACE ENCOUNTER: MEDICARE and MEDICAID PATIENTS: I certify that this patient is under my care and that I had a face-to-face encounter that meets the physician face-to-face encounter requirements with this patient on this date. The encounter with the patient was in whole or in part for the following MEDICAL CONDITION: (primary reason for [place]) MEDICAL NECESSITY: I certify, that based on my findings, NURSING services are a medically necessary home health service. HOME BOUND STATUS: I certify that my clinical findings support that this patient is homebound (i.e., Due to illness or injury, pt requires aid of supportive devices such as crutches, cane, wheelchairs, walkers, the use of special transportation or the assistance of another person to leave their place of residence. There is a normal inability to leave the home and doing so requires considerable and taxing effort. Other absences are for medical reasons / religious services and are infrequent or of short duration when for other reasons). o If current dressing causes  regression in wound condition, may D/C ordered dressing product/s and apply  Normal Saline Moist Dressing daily until next [place] / Other MD appointment. [place] of regression in wound condition at [phone]. o Please direct any NON-WOUND related issues/requests for orders to patient's Primary Care Physician Electronic Signature(s) Signed: 08/20/2018 5:48:37 PM By: [name] PA-C Signed: 08/21/2018 5:12:41 PM By: Montey Hora Entered By: Montey Hora on 08/20/2018 10:27:01 [name], [name] ([id number]) -------------------------------------------------------------------------------- Problem List Details Patient Name: ARETA, TERWILLIGER. Date of Service: 08/20/2018 10:00 AM Medical Record Number: [id number] Patient Account Number: [id number] Date of Birth/Sex: 06-09-1936 (82 y.o. F) Treating RN: Primary Care Provider: Fenton Malling Other Clinician: Referring Provider: Fenton Malling Treating Provider/Extender: Melburn Hake, HOYT Weeks in Treatment: 17 Active Problems ICD-10 Evaluated Encounter Code Description Active Date Today Diagnosis S81.812S Laceration without foreign body, left lower leg, sequela 04/23/2018 No Yes S80.212S Abrasion, left knee, sequela 04/23/2018 No Yes S80.211S Abrasion, right knee, sequela 04/23/2018 No Yes L97.822 Non-pressure chronic ulcer of other part of left lower leg with 05/14/2018 No Yes fat layer exposed Inactive Problems Resolved Problems Electronic Signature(s) Signed: 08/20/2018 5:48:37 PM By: [name] PA-C Entered By: [name] on 08/20/2018 10:18:50 Canepa, [name] ([id number]) -------------------------------------------------------------------------------- Progress Note Details Patient Name: [name] Date of Service: 08/20/2018 10:00 AM Medical Record Number: [id number] Patient Account Number: [id number] Date of Birth/Sex: 19-Oct-1935 (82 y.o. F) Treating RN: Primary  Care Provider: Fenton Malling Other Clinician: Referring Provider: Fenton Malling Treating Provider/Extender: Melburn Hake, HOYT Weeks in Treatment: 17 Subjective Chief Complaint Information obtained from Patient LLE Ulcers History of Present Illness (HPI) 04/23/18-She is seen in initial evaluation for multiple skin tears s/p fall on 8/19. She did sustain a closed fracture of the right patella secondary to that fall. She presented to emergency department on 8/19 for treatment. The left leg laceration was unable to be sutured closed, Steri-Strips were applied and she was treated with knee immobilizer for the patella fracture. She continues on Keflex that was initiated on 8/19. The left leg skin tear flap was cleansed and reapproximated, secure with steri strips. She will follow up next week 04/30/18-She is seen in follow-up evaluation for multiple skin tears. She continues with knee immobilizers to the right lower extremity, has developed lower extremity edema with subsequent weeping; will initiate ace wrap compression. The flap is mostly devitalized, which was somewhat expected; we will change treatment plan and she will follow up next week 05/07/18-She is seen in follow-up evaluation for multiple wounds. The left lateral lower leg skin flap is nonviable/devitalized, now eschar covered. She tolerated debridement fairly well. The right lower extremity skin tear that was new last week has essentially healed. She has responded nicely to Ace wrap compression. We will modify treatment plan and she will follow-up next week 05/14/18 on evaluation today patient actually appears to be doing a little better in regard to her left lower extremity ulcers. With that being said she has been tolerating the dressing changes without complication. Medihoney is what she was switch to dear in the last week's visit. Prior to that we used Iodoflex when she had the eschar present. Nonetheless I believe that the  Iodoflex may be the best thing for her at this point in regard to the current wound bed and prevention of biofilm buildup. Nonetheless she seems to be tolerating the dressing changes without complication which is good news. It's really the debridement the calls are the most pain last week she did allow for debridement this week she also agreed to  allow me to attempt debridement in order to try to help this wound to heal more quickly. 05/21/18 on evaluation today patient actually appears to be doing much better at this time in regard to her left lateral lower Trinity ulcer. She's been tolerating the dressing changes without complication. Fortunately there does not appear to be any evidence of infection at this time. There is still some Slough noted on the surface of the wound although not as much as previous. She continues to have a lot of discomfort currently. She tells me that once we put the Iodoflex on the last time it actually burned from Thursday till Sunday night and then eased off. Nonetheless it does seem to have made a great improvement in general in regard to the patient's wound bed. 05/28/18 patient's wound bed at this point actually show signs of good improvement which is excellent news currently. She has been tolerating the dressing changes without complication. She does have a little bit of slightly hyper granular tissue at this point there's also some necrotic tissue noted on the surface of the wound. There does not appear to be any evidence of infection overall at this time. In general I feel like that she has made good progress in the past two weeks that I have been seeing her. 06/04/18 on evaluation today patient's left lower Trinity ulcer actually appears to be doing very well today. She has great improvement with excellent epithelialization at this time. Overall I feel like she has made great progress even since last time I seen her. There's really no necrotic tissue on the surface  of the wound which is also great news her pain appears to be better. 06/18/18 on evaluation today the patient continues to show signs of improvement in regard to her lower extremity ulcer. She's AMARRAH, MEINHART ([id number]) been tolerating the [facility] Dressing without complication and the wound bed seems to be doing excellent at this time. Overall I'm very pleased with the progress and were things stand. 07/02/18 on evaluation today patient actually appears to be doing excellent in regard to her left lower Trinity ulcer. This is definitely smaller and is shown signs of good improvement. It is going somewhat slow but nonetheless we have a steady progression of the wound which is great news. Overall very pleased with the progress that has been made. She's having very little pain at this time. 07/16/18 on evaluation today patient actually appears to be doing very well in regard to her lower extremity ulcer. She's been tolerating the dressing changes without complication. Fortunately there is no evidence of anything infection wise occurring at this time. She barely is going to require debridement today. 08/06/18 the on evaluation today patient actually appears to be doing much better in regard to her lower Trinity ulcer. In fact this appears to be very close to complete healing. Fortunately there's no sign of infection at this time. The Hydrofera Blue Dressing to continues to stick somewhat to the wound bed unfortunately. 08/20/18 on evaluation today patient actually appears to be doing very well in regard to the left lower extremity ulcer. She has been tolerating the dressing changes without complications. She tolerates this very well. With that being said I do not see any evidence of infection at this time. Overall I think she is making wonderful progress is very close to complete healing. Patient History Information obtained from Patient. Family History Cancer - Mother, Lung Disease -  Siblings, No family history of Diabetes, Heart Disease, Hereditary  Spherocytosis, Hypertension, Kidney Disease, Seizures, Stroke, Thyroid Problems, Tuberculosis. Social History Never smoker, Marital Status - Married, Alcohol Use - Never, Drug Use - No History, Caffeine Use - Daily. Medical And Surgical History Notes Respiratory pulmonary fibrosis, home O2 at night and PRN Gastrointestinal diverticulosis Review of Systems (ROS) Constitutional Symptoms (General Health) Denies complaints or symptoms of Fever, Chills. Respiratory The patient has no complaints or symptoms. Cardiovascular Complains or has symptoms of LE edema. Psychiatric The patient has no complaints or symptoms. Objective Constitutional [name], [name]. ([id number]) Well-nourished and well-hydrated in no acute distress. Vitals Time Taken: 9:55 AM, Height: 60 in, Weight: 151 lbs, BMI: 29.5, Temperature: 98.0 F, Pulse: 60 bpm, Respiratory Rate: 16 breaths/min, Blood Pressure: 142/76 mmHg. Respiratory normal breathing without difficulty. clear to auscultation bilaterally. Cardiovascular regular rate and rhythm with normal S1, S2. Psychiatric this patient is able to make decisions and demonstrates good insight into disease process. Alert and Oriented x 3. pleasant and cooperative. General Notes: Patient's wound bed currently shows evidence of good granulation at this time. She did have some dry skin around the edge of the wound as well that scar which was mechanically debrided away with saline and gauze she tolerated this without complication. Post debridement this seems to be doing much better. No sharp debridement was required today. Integumentary (Hair, Skin) Wound #3 status is Open. Original cause of wound was Trauma. The wound is located on the Left,Lateral Lower Leg. The wound measures 0.4cm length x 0.7cm width x 0.1cm depth; 0.22cm^2 area and 0.022cm^3 volume. There is Fat Layer (Subcutaneous Tissue) Exposed  exposed. There is no tunneling or undermining noted. There is a small amount of serous drainage noted. The wound margin is flat and intact. There is no granulation within the wound bed. There is a medium (34- 66%) amount of necrotic tissue within the wound bed including Eschar. The periwound skin appearance did not exhibit: Callus, Crepitus, Excoriation, Induration, Rash, Scarring, Dry/Scaly, Maceration, Atrophie Blanche, Cyanosis, Ecchymosis, Hemosiderin Staining, Mottled, Pallor, Rubor, Erythema. Periwound temperature was noted as No Abnormality. The periwound has tenderness on palpation. Assessment Active Problems ICD-10 Laceration without foreign body, left lower leg, sequela Abrasion, left knee, sequela Abrasion, right knee, sequela Non-pressure chronic ulcer of other part of left lower leg with fat layer exposed Plan Wound Cleansing: Wound #3 Left,Lateral Lower Leg: Clean wound with Normal Saline. May Shower, gently pat wound dry prior to applying new dressing. - use Dial antimicrobial soap to wash wound Anesthetic (add to Medication List): Wound #3 Left,Lateral Lower Leg: Topical Lidocaine 4% cream applied to wound bed prior to debridement (In Clinic Only). [name], [name] ([id number]) Primary Wound Dressing: Wound #3 Left,Lateral Lower Leg: Hydrafera Blue Ready Transfer - Add adaptic to wound bed before hydrafera blue, to protect. Secondary Dressing: Wound #3 Left,Lateral Lower Leg: ABD and Kerlix/Conform - ace wrap Dressing Change Frequency: Wound #3 Left,Lateral Lower Leg: Three times weekly - [facility] Tuesday and Saturdays dressing changes. Patient comes to clinic on Thursdays. Follow-up Appointments: Wound #3 Left,Lateral Lower Leg: Return Appointment in 2 weeks. - HHRN to visit patient 3 times weekly when she does not come into [facility] and twice weekly when she does come into clinic Edema Control: Wound #3 Left,Lateral Lower Leg: Elevate legs to the  level of the heart and pump ankles as often as possible Other: - ACE wrap Additional Orders / Instructions: Wound #3 Left,Lateral Lower Leg: Vitamin A; Vitamin C, Zinc - Please add a multivitamin that has 100%  of vitamin A, vitamin C and zinc supplements in it Increase protein intake. Activity as tolerated Home Health: Wound #3 Left,Lateral Lower Leg: [place] Nurse may visit PRN to address patient s wound care needs. FACE TO FACE ENCOUNTER: MEDICARE and MEDICAID PATIENTS: I certify that this patient is under my care and that I had a face-to-face encounter that meets the physician face-to-face encounter requirements with this patient on this date. The encounter with the patient was in whole or in part for the following MEDICAL CONDITION: (primary reason for [place]) MEDICAL NECESSITY: I certify, that based on my findings, NURSING services are a medically necessary home health service. HOME BOUND STATUS: I certify that my clinical findings support that this patient is homebound (i.e., Due to illness or injury, pt requires aid of supportive devices such as crutches, cane, wheelchairs, walkers, the use of special transportation or the assistance of another person to leave their place of residence. There is a normal inability to leave the home and doing so requires considerable and taxing effort. Other absences are for medical reasons / religious services and are infrequent or of short duration when for other reasons). If current dressing causes regression in wound condition, may D/C ordered dressing product/s and apply Normal Saline Moist Dressing daily until next [place] / Other MD appointment. [place] of regression in wound condition at [phone]. Please direct any NON-WOUND related issues/requests for orders to patient's Primary Care Physician I am going to recommend at this point that we go ahead and initiate the  above wound care measures be continued for the next week. She's in agreement this plan. Please see above for specific wound care orders. We will see patient for re-evaluation in 2 week(s) here in the clinic. If anything worsens or changes patient will contact our office for additional recommendations. Electronic Signature(s) Signed: 08/20/2018 5:48:37 PM By: [name] PA-C Entered By: [name] on 08/20/2018 10:34:01 [name], [name] ([id number]) -------------------------------------------------------------------------------- ROS/PFSH Details Patient Name: SHALAUNDA, WEATHERHOLTZ. Date of Service: 08/20/2018 10:00 AM Medical Record Number: [id number] Patient Account Number: [id number] Date of Birth/Sex: 06-10-1936 (82 y.o. F) Treating RN: Primary Care Provider: Fenton Malling Other Clinician: Referring Provider: Fenton Malling Treating Provider/Extender: Melburn Hake, HOYT Weeks in Treatment: 17 Information Obtained From Patient Wound History Do you currently have one or more open woundso Yes How many open wounds do you currently haveo 2 Approximately how long have you had your woundso 4 days How have you been treating your wound(s) until nowo bandage from the ER Has your wound(s) ever healed and then re-openedo No Have you had any lab work done in the past montho Yes Who ordered the lab work [facility] Have you tested positive for an antibiotic resistant organism (MRSA, VRE)o No Have you tested positive for osteomyelitis (bone infection)o No Have you had any tests for circulation on your legso No Have you had other problems associated with your woundso Swelling Constitutional Symptoms (General Health) Complaints and Symptoms: Negative for: Fever; Chills Cardiovascular Complaints and Symptoms: Positive for: LE edema Medical History: Positive for: Hypertension Negative for: Angina; Arrhythmia; Congestive Heart Failure; Coronary Artery Disease; Deep Vein  Thrombosis; Hypotension; Myocardial Infarction; Peripheral Arterial Disease; Peripheral Venous Disease; Phlebitis; Vasculitis Eyes Medical History: Negative for: Cataracts; Glaucoma; Optic Neuritis Ear/Nose/Mouth/Throat Medical History: Negative for: Chronic sinus problems/congestion; Middle ear problems Hematologic/Lymphatic Medical History: Negative for: Anemia; Hemophilia; Human Immunodeficiency Virus; Lymphedema; Sickle Cell Disease Respiratory Complaints and  Symptoms: No Complaints or Symptoms SHUNDA, RABADI ([id number]) Medical History: Negative for: Aspiration; Asthma; Chronic Obstructive Pulmonary Disease (COPD); Pneumothorax; Sleep Apnea; Tuberculosis Past Medical History Notes: pulmonary fibrosis, home O2 at night and PRN Gastrointestinal Medical History: Negative for: Cirrhosis ; Colitis; Crohnos; Hepatitis A; Hepatitis B; Hepatitis C Past Medical History Notes: diverticulosis Endocrine Medical History: Negative for: Type I Diabetes; Type II Diabetes Genitourinary Medical History: Negative for: End Stage Renal Disease Immunological Medical History: Negative for: Lupus Erythematosus; Raynaudos; Scleroderma Integumentary (Skin) Medical History: Negative for: History of Burn; History of pressure wounds Musculoskeletal Medical History: Positive for: Osteoarthritis Negative for: Gout; Rheumatoid Arthritis; Osteomyelitis Neurologic Medical History: Negative for: Dementia; Neuropathy; Quadriplegia; Paraplegia; Seizure Disorder Oncologic Medical History: Negative for: Received Chemotherapy; Received Radiation Psychiatric Complaints and Symptoms: No Complaints or Symptoms Medical History: Negative for: Anorexia/bulimia; Confinement Anxiety Immunizations TIMMY, CLEVERLY ([id number]) Pneumococcal Vaccine: Received Pneumococcal Vaccination: Yes Implantable Devices Family and Social History Cancer: Yes - Mother; Diabetes: No; Heart Disease: No;  Hereditary Spherocytosis: No; Hypertension: No; Kidney Disease: No; Lung Disease: Yes - Siblings; Seizures: No; Stroke: No; Thyroid Problems: No; Tuberculosis: No; Never smoker; Marital Status - Married; Alcohol Use: Never; Drug Use: No History; Caffeine Use: Daily; Financial Concerns: No; Food, Clothing or Shelter Needs: No; Support System Lacking: No; Transportation Concerns: No; Advanced Directives: No; Patient does not want information on Advanced Directives Physician Affirmation I have reviewed and agree with the above information. Electronic Signature(s) Signed: 08/20/2018 5:48:37 PM By: [name] PA-C Entered By: [name] on 08/20/2018 10:32:40 COREE, RIESTER ([id number]) -------------------------------------------------------------------------------- SuperBill Details Patient Name: RENIYAH, GOOTEE Date of Service: 08/20/2018 Medical Record Number: [id number] Patient Account Number: [id number] Date of Birth/Sex: [birth year] (82 y.o. F) Treating RN: Primary Care Provider: Fenton Malling Other Clinician: Referring Provider: Fenton Malling Treating Provider/Extender: Melburn Hake, HOYT Weeks in Treatment: 17 Diagnosis Coding ICD-10 Codes Code Description [phone] Laceration without foreign body, left lower leg, sequela S80.212S Abrasion, left knee, sequela S80.211S Abrasion, right knee, sequela L97.822 Non-pressure chronic ulcer of other part of left lower leg with fat layer exposed Facility Procedures CPT4 Code: [id number] Description: [place] VISIT-LEV 3 EST PT Modifier: Quantity: 1 Physician Procedures CPT4 Code Description: [id number] - WC PHYS LEVEL 4 - EST PT ICD-10 Diagnosis Description S81.812S Laceration without foreign body, left lower leg, sequela S80.212S Abrasion, left knee, sequela S80.211S Abrasion, right knee, sequela L97.822  Non-pressure chronic ulcer of other part of left lower leg wit Modifier: h fat layer expos Quantity:  1 ed Electronic Signature(s) Signed: 08/20/2018 5:48:37 PM By: [name] PA-C Entered By: [name] on 08/20/2018 10:34:13

## 2018-08-23 NOTE — Progress Notes (Signed)
CHT Entered By: Lorine Bears on 08/20/2018 09:58:07 [name] ([id number]) -------------------------------------------------------------------------------- Patient/Caregiver Education Details Patient Name: [name], [name] Date of Service: 08/20/2018 10:00 AM Medical Record Number: [id number] Patient Account Number: [id number] Date of Birth/Gender: 1935/12/13 (82 y.o. F) Treating RN: Montey Hora Primary Care Physician: Fenton Malling Other Clinician: Referring Physician: Fenton Malling Treating  Physician/Extender: Sharalyn Ink in Treatment: 17 Education Assessment Education Provided To: Patient Education Topics Provided Wound/Skin Impairment: Handouts: Other: wound care a s ordered Methods: Demonstration, Explain/Verbal Responses: State content correctly Electronic Signature(s) Signed: 08/21/2018 5:12:41 PM By: Montey Hora Entered By: Montey Hora on 08/20/2018 10:22:04 [name] ([id number]) -------------------------------------------------------------------------------- Wound Assessment Details Patient Name: [name], [name]. Date of Service: 08/20/2018 10:00 AM Medical Record Number: [id number] Patient Account Number: [id number] Date of Birth/Sex: 20-Dec-1935 (82 y.o. F) Treating RN: Montey Hora Primary Care Capria Cartaya: Fenton Malling Other Clinician: Referring Zoeann Mol: Fenton Malling Treating Vanya Carberry/Extender: Melburn Hake, HOYT Weeks in Treatment: 17 Wound Status Wound Number: 3 Primary Etiology: Skin Tear Wound Location: Left, Lateral Lower Leg Wound Status: Open Wounding Event: Trauma Comorbid History: Hypertension, Osteoarthritis Date Acquired: 04/20/2018 Weeks Of Treatment: 17 Clustered Wound: No Photos Photo Uploaded By: Secundino Ginger on 08/20/2018 10:14:30 Wound Measurements Length: (cm) 0.4 Width: (cm) 0.7 Depth: (cm) 0.1 Area: (cm) 0.22 Volume: (cm) 0.022 % Reduction in Area: 99.8% % Reduction in Volume: 99.9% Epithelialization: Large (67-100%) Tunneling: No Undermining: No Wound Description Full Thickness Without Exposed Support Foul Odo Classification: Structures Slough/F Wound Margin: Flat and Intact Exudate Small Amount: Exudate Type: Serous Exudate Color: amber r After Cleansing: No ibrino No Wound Bed Granulation Amount: None Present (0%) Exposed Structure Necrotic Amount: Medium (34-66%) Fascia Exposed: No Necrotic Quality: Eschar Fat Layer (Subcutaneous Tissue) Exposed: Yes Tendon Exposed:  No Muscle Exposed: No Joint Exposed: No Bone Exposed: No Primmer, Jimmi J. ([id number]) Periwound Skin Texture Texture Color No Abnormalities Noted: No No Abnormalities Noted: No Callus: No Atrophie Blanche: No Crepitus: No Cyanosis: No Excoriation: No Ecchymosis: No Induration: No Erythema: No Rash: No Hemosiderin Staining: No Scarring: No Mottled: No Pallor: No Moisture Rubor: No No Abnormalities Noted: No Dry / Scaly: No Temperature / Pain Maceration: No Temperature: No Abnormality Tenderness on Palpation: Yes Wound Preparation Ulcer Cleansing: Rinsed/Irrigated with Saline Topical Anesthetic Applied: Other: lidocaine 4%, Treatment Notes Wound #3 (Left, Lateral Lower Leg) Notes Hydrafera Blue, gauze, conform secured with ace wrap Electronic Signature(s) Signed: 08/21/2018 5:12:41 PM By: Montey Hora Entered By: Montey Hora on 08/20/2018 10:20:35 [name] ([id number]) -------------------------------------------------------------------------------- Vitals Details Patient Name: [name]. Date of Service: 08/20/2018 10:00 AM Medical Record Number: [id number] Patient Account Number: [id number] Date of Birth/Sex: 1936/08/12 (82 y.o. F) Treating RN: Primary Care Tayvion Lauder: Fenton Malling Other Clinician: Referring Estefanny Moler: Fenton Malling Treating Kely Dohn/Extender: Melburn Hake, HOYT Weeks in Treatment: 17 Vital Signs Time Taken: 09:55 Temperature (F): 98.0 Height (in): 60 Pulse (bpm): 60 Weight (lbs): 151 Respiratory Rate (breaths/min): 16 Body Mass Index (BMI): 29.5 Blood Pressure (mmHg): 142/76 Reference Range: 80 - 120 mg / dl Electronic Signature(s) Signed: 08/20/2018 3:46:06 PM By: Lorine Bears RCP, RRT, CHT Entered By: Becky Sax, Amado Nash on 08/20/2018 09:58:47  CHT Entered By: Lorine Bears on 08/20/2018 09:58:07 [name] ([id number]) -------------------------------------------------------------------------------- Patient/Caregiver Education Details Patient Name: [name], [name] Date of Service: 08/20/2018 10:00 AM Medical Record Number: [id number] Patient Account Number: [id number] Date of Birth/Gender: 1935/12/13 (82 y.o. F) Treating RN: Montey Hora Primary Care Physician: Fenton Malling Other Clinician: Referring Physician: Fenton Malling Treating  Physician/Extender: Sharalyn Ink in Treatment: 17 Education Assessment Education Provided To: Patient Education Topics Provided Wound/Skin Impairment: Handouts: Other: wound care a s ordered Methods: Demonstration, Explain/Verbal Responses: State content correctly Electronic Signature(s) Signed: 08/21/2018 5:12:41 PM By: Montey Hora Entered By: Montey Hora on 08/20/2018 10:22:04 [name] ([id number]) -------------------------------------------------------------------------------- Wound Assessment Details Patient Name: [name], [name]. Date of Service: 08/20/2018 10:00 AM Medical Record Number: [id number] Patient Account Number: [id number] Date of Birth/Sex: 20-Dec-1935 (82 y.o. F) Treating RN: Montey Hora Primary Care Capria Cartaya: Fenton Malling Other Clinician: Referring Zoeann Mol: Fenton Malling Treating Vanya Carberry/Extender: Melburn Hake, HOYT Weeks in Treatment: 17 Wound Status Wound Number: 3 Primary Etiology: Skin Tear Wound Location: Left, Lateral Lower Leg Wound Status: Open Wounding Event: Trauma Comorbid History: Hypertension, Osteoarthritis Date Acquired: 04/20/2018 Weeks Of Treatment: 17 Clustered Wound: No Photos Photo Uploaded By: Secundino Ginger on 08/20/2018 10:14:30 Wound Measurements Length: (cm) 0.4 Width: (cm) 0.7 Depth: (cm) 0.1 Area: (cm) 0.22 Volume: (cm) 0.022 % Reduction in Area: 99.8% % Reduction in Volume: 99.9% Epithelialization: Large (67-100%) Tunneling: No Undermining: No Wound Description Full Thickness Without Exposed Support Foul Odo Classification: Structures Slough/F Wound Margin: Flat and Intact Exudate Small Amount: Exudate Type: Serous Exudate Color: amber r After Cleansing: No ibrino No Wound Bed Granulation Amount: None Present (0%) Exposed Structure Necrotic Amount: Medium (34-66%) Fascia Exposed: No Necrotic Quality: Eschar Fat Layer (Subcutaneous Tissue) Exposed: Yes Tendon Exposed:  No Muscle Exposed: No Joint Exposed: No Bone Exposed: No Primmer, Jimmi J. ([id number]) Periwound Skin Texture Texture Color No Abnormalities Noted: No No Abnormalities Noted: No Callus: No Atrophie Blanche: No Crepitus: No Cyanosis: No Excoriation: No Ecchymosis: No Induration: No Erythema: No Rash: No Hemosiderin Staining: No Scarring: No Mottled: No Pallor: No Moisture Rubor: No No Abnormalities Noted: No Dry / Scaly: No Temperature / Pain Maceration: No Temperature: No Abnormality Tenderness on Palpation: Yes Wound Preparation Ulcer Cleansing: Rinsed/Irrigated with Saline Topical Anesthetic Applied: Other: lidocaine 4%, Treatment Notes Wound #3 (Left, Lateral Lower Leg) Notes Hydrafera Blue, gauze, conform secured with ace wrap Electronic Signature(s) Signed: 08/21/2018 5:12:41 PM By: Montey Hora Entered By: Montey Hora on 08/20/2018 10:20:35 [name] ([id number]) -------------------------------------------------------------------------------- Vitals Details Patient Name: [name]. Date of Service: 08/20/2018 10:00 AM Medical Record Number: [id number] Patient Account Number: [id number] Date of Birth/Sex: 1936/08/12 (82 y.o. F) Treating RN: Primary Care Tayvion Lauder: Fenton Malling Other Clinician: Referring Estefanny Moler: Fenton Malling Treating Kely Dohn/Extender: Melburn Hake, HOYT Weeks in Treatment: 17 Vital Signs Time Taken: 09:55 Temperature (F): 98.0 Height (in): 60 Pulse (bpm): 60 Weight (lbs): 151 Respiratory Rate (breaths/min): 16 Body Mass Index (BMI): 29.5 Blood Pressure (mmHg): 142/76 Reference Range: 80 - 120 mg / dl Electronic Signature(s) Signed: 08/20/2018 3:46:06 PM By: Lorine Bears RCP, RRT, CHT Entered By: Becky Sax, Amado Nash on 08/20/2018 09:58:47  [name], [name] ([id number]) Visit Report for 08/20/2018 Arrival Information Details Patient Name: [name], [name]. Date of Service: 08/20/2018 10:00 AM Medical Record Number: [id number] Patient Account Number: [id number] Date of Birth/Sex: 04/14/1936 (82 y.o. F) Treating RN: Primary Care Ronnald Shedden: Fenton Malling Other Clinician: Referring Abagale Boulos: Fenton Malling Treating Jehiel Koepp/Extender: Melburn Hake, HOYT Weeks in Treatment: 71 Visit Information History Since Last Visit Added or deleted any medications: No Patient Arrived: Ambulatory Any new allergies or adverse reactions: No Arrival Time: 09:57 Had a fall or experienced change in No Accompanied By: self activities of daily living that may affect Transfer Assistance: None risk of falls: Patient Identification Verified: Yes Signs or symptoms of abuse/neglect since last visito No Secondary Verification Process Completed: Yes Hospitalized since last visit: No Implantable device outside of the clinic excluding No cellular tissue based products placed in the center since last visit: Has Dressing in Place as Prescribed: Yes Pain Present Now: No Electronic Signature(s) Signed: 08/20/2018 3:46:06 PM By: Lorine Bears RCP, RRT, CHT Entered By: Lorine Bears on 08/20/2018 09:58:01 Ruddell, [name] ([id number]) -------------------------------------------------------------------------------- Clinic Level of Care Assessment Details Patient Name: [name], [name]. Date of Service: 08/20/2018 10:00 AM Medical Record Number: [id number] Patient Account Number: [id number] Date of Birth/Sex: 11-Dec-1935 (82 y.o. F) Treating RN: Montey Hora Primary Care Jeslin Bazinet: Fenton Malling Other Clinician: Referring Mackensi Mahadeo: Fenton Malling Treating Fayelynn Distel/Extender: Melburn Hake, HOYT Weeks in Treatment: 17 Clinic Level of Care Assessment Items TOOL 4 Quantity Score []  - Use when only an EandM is  performed on FOLLOW-UP visit 0 ASSESSMENTS - Nursing Assessment / Reassessment X - Reassessment of Co-morbidities (includes updates in patient status) 1 10 X- 1 5 Reassessment of Adherence to Treatment Plan ASSESSMENTS - Wound and Skin Assessment / Reassessment X - Simple Wound Assessment / Reassessment - one wound 1 5 []  - 0 Complex Wound Assessment / Reassessment - multiple wounds []  - 0 Dermatologic / Skin Assessment (not related to wound area) ASSESSMENTS - Focused Assessment []  - Circumferential Edema Measurements - multi extremities 0 []  - 0 Nutritional Assessment / Counseling / Intervention X- 1 5 Lower Extremity Assessment (monofilament, tuning fork, pulses) []  - 0 Peripheral Arterial Disease Assessment (using hand held doppler) ASSESSMENTS - Ostomy and/or Continence Assessment and Care []  - Incontinence Assessment and Management 0 []  - 0 Ostomy Care Assessment and Management (repouching, etc.) PROCESS - Coordination of Care X - Simple Patient / Family Education for ongoing care 1 15 []  - 0 Complex (extensive) Patient / Family Education for ongoing care []  - 0 Staff obtains Programmer, systems, Records, Test Results / Process Orders []  - 0 Staff telephones HHA, Nursing Homes / Clarify orders / etc []  - 0 Routine Transfer to another Facility (non-emergent condition) []  - 0 Routine Hospital Admission (non-emergent condition) []  - 0 New Admissions / Biomedical engineer / Ordering NPWT, Apligraf, etc. []  - 0 Emergency Hospital Admission (emergent condition) X- 1 10 Simple Discharge Coordination [name], [name] ([id number]) []  - 0 Complex (extensive) Discharge Coordination PROCESS - Special Needs []  - Pediatric / Minor Patient Management 0 []  - 0 Isolation Patient Management []  - 0 Hearing / Language / Visual special needs []  - 0 Assessment of Community assistance (transportation, D/C planning, etc.) []  - 0 Additional assistance / Altered mentation []  -  0 Support Surface(s) Assessment (bed, cushion, seat, etc.) INTERVENTIONS - Wound Cleansing / Measurement X - Simple Wound Cleansing - one wound 1 5 []  - 0 Complex Wound Cleansing - multiple wounds X- 1  CHT Entered By: Lorine Bears on 08/20/2018 09:58:07 [name] ([id number]) -------------------------------------------------------------------------------- Patient/Caregiver Education Details Patient Name: [name], [name] Date of Service: 08/20/2018 10:00 AM Medical Record Number: [id number] Patient Account Number: [id number] Date of Birth/Gender: 1935/12/13 (82 y.o. F) Treating RN: Montey Hora Primary Care Physician: Fenton Malling Other Clinician: Referring Physician: Fenton Malling Treating  Physician/Extender: Sharalyn Ink in Treatment: 17 Education Assessment Education Provided To: Patient Education Topics Provided Wound/Skin Impairment: Handouts: Other: wound care a s ordered Methods: Demonstration, Explain/Verbal Responses: State content correctly Electronic Signature(s) Signed: 08/21/2018 5:12:41 PM By: Montey Hora Entered By: Montey Hora on 08/20/2018 10:22:04 [name] ([id number]) -------------------------------------------------------------------------------- Wound Assessment Details Patient Name: [name], [name]. Date of Service: 08/20/2018 10:00 AM Medical Record Number: [id number] Patient Account Number: [id number] Date of Birth/Sex: 20-Dec-1935 (82 y.o. F) Treating RN: Montey Hora Primary Care Capria Cartaya: Fenton Malling Other Clinician: Referring Zoeann Mol: Fenton Malling Treating Vanya Carberry/Extender: Melburn Hake, HOYT Weeks in Treatment: 17 Wound Status Wound Number: 3 Primary Etiology: Skin Tear Wound Location: Left, Lateral Lower Leg Wound Status: Open Wounding Event: Trauma Comorbid History: Hypertension, Osteoarthritis Date Acquired: 04/20/2018 Weeks Of Treatment: 17 Clustered Wound: No Photos Photo Uploaded By: Secundino Ginger on 08/20/2018 10:14:30 Wound Measurements Length: (cm) 0.4 Width: (cm) 0.7 Depth: (cm) 0.1 Area: (cm) 0.22 Volume: (cm) 0.022 % Reduction in Area: 99.8% % Reduction in Volume: 99.9% Epithelialization: Large (67-100%) Tunneling: No Undermining: No Wound Description Full Thickness Without Exposed Support Foul Odo Classification: Structures Slough/F Wound Margin: Flat and Intact Exudate Small Amount: Exudate Type: Serous Exudate Color: amber r After Cleansing: No ibrino No Wound Bed Granulation Amount: None Present (0%) Exposed Structure Necrotic Amount: Medium (34-66%) Fascia Exposed: No Necrotic Quality: Eschar Fat Layer (Subcutaneous Tissue) Exposed: Yes Tendon Exposed:  No Muscle Exposed: No Joint Exposed: No Bone Exposed: No Primmer, Jimmi J. ([id number]) Periwound Skin Texture Texture Color No Abnormalities Noted: No No Abnormalities Noted: No Callus: No Atrophie Blanche: No Crepitus: No Cyanosis: No Excoriation: No Ecchymosis: No Induration: No Erythema: No Rash: No Hemosiderin Staining: No Scarring: No Mottled: No Pallor: No Moisture Rubor: No No Abnormalities Noted: No Dry / Scaly: No Temperature / Pain Maceration: No Temperature: No Abnormality Tenderness on Palpation: Yes Wound Preparation Ulcer Cleansing: Rinsed/Irrigated with Saline Topical Anesthetic Applied: Other: lidocaine 4%, Treatment Notes Wound #3 (Left, Lateral Lower Leg) Notes Hydrafera Blue, gauze, conform secured with ace wrap Electronic Signature(s) Signed: 08/21/2018 5:12:41 PM By: Montey Hora Entered By: Montey Hora on 08/20/2018 10:20:35 [name] ([id number]) -------------------------------------------------------------------------------- Vitals Details Patient Name: [name]. Date of Service: 08/20/2018 10:00 AM Medical Record Number: [id number] Patient Account Number: [id number] Date of Birth/Sex: 1936/08/12 (82 y.o. F) Treating RN: Primary Care Tayvion Lauder: Fenton Malling Other Clinician: Referring Estefanny Moler: Fenton Malling Treating Kely Dohn/Extender: Melburn Hake, HOYT Weeks in Treatment: 17 Vital Signs Time Taken: 09:55 Temperature (F): 98.0 Height (in): 60 Pulse (bpm): 60 Weight (lbs): 151 Respiratory Rate (breaths/min): 16 Body Mass Index (BMI): 29.5 Blood Pressure (mmHg): 142/76 Reference Range: 80 - 120 mg / dl Electronic Signature(s) Signed: 08/20/2018 3:46:06 PM By: Lorine Bears RCP, RRT, CHT Entered By: Becky Sax, Amado Nash on 08/20/2018 09:58:47

## 2018-08-24 DIAGNOSIS — M6281 Muscle weakness (generalized): Secondary | ICD-10-CM | POA: Diagnosis not present

## 2018-08-24 DIAGNOSIS — M5416 Radiculopathy, lumbar region: Secondary | ICD-10-CM | POA: Diagnosis not present

## 2018-08-25 DIAGNOSIS — Z9181 History of falling: Secondary | ICD-10-CM | POA: Diagnosis not present

## 2018-08-25 DIAGNOSIS — I10 Essential (primary) hypertension: Secondary | ICD-10-CM | POA: Diagnosis not present

## 2018-08-25 DIAGNOSIS — M4856XS Collapsed vertebra, not elsewhere classified, lumbar region, sequela of fracture: Secondary | ICD-10-CM | POA: Diagnosis not present

## 2018-08-25 DIAGNOSIS — M150 Primary generalized (osteo)arthritis: Secondary | ICD-10-CM | POA: Diagnosis not present

## 2018-08-25 DIAGNOSIS — S81812D Laceration without foreign body, left lower leg, subsequent encounter: Secondary | ICD-10-CM | POA: Diagnosis not present

## 2018-08-25 DIAGNOSIS — J84112 Idiopathic pulmonary fibrosis: Secondary | ICD-10-CM | POA: Diagnosis not present

## 2018-08-29 DIAGNOSIS — Z9181 History of falling: Secondary | ICD-10-CM | POA: Diagnosis not present

## 2018-08-29 DIAGNOSIS — S81812D Laceration without foreign body, left lower leg, subsequent encounter: Secondary | ICD-10-CM | POA: Diagnosis not present

## 2018-08-29 DIAGNOSIS — J84112 Idiopathic pulmonary fibrosis: Secondary | ICD-10-CM | POA: Diagnosis not present

## 2018-08-29 DIAGNOSIS — I10 Essential (primary) hypertension: Secondary | ICD-10-CM | POA: Diagnosis not present

## 2018-08-29 DIAGNOSIS — M4856XS Collapsed vertebra, not elsewhere classified, lumbar region, sequela of fracture: Secondary | ICD-10-CM | POA: Diagnosis not present

## 2018-08-29 DIAGNOSIS — M150 Primary generalized (osteo)arthritis: Secondary | ICD-10-CM | POA: Diagnosis not present

## 2018-08-30 DIAGNOSIS — M150 Primary generalized (osteo)arthritis: Secondary | ICD-10-CM | POA: Diagnosis not present

## 2018-08-30 DIAGNOSIS — J84112 Idiopathic pulmonary fibrosis: Secondary | ICD-10-CM | POA: Diagnosis not present

## 2018-08-30 DIAGNOSIS — I10 Essential (primary) hypertension: Secondary | ICD-10-CM | POA: Diagnosis not present

## 2018-08-30 DIAGNOSIS — S81812D Laceration without foreign body, left lower leg, subsequent encounter: Secondary | ICD-10-CM | POA: Diagnosis not present

## 2018-08-30 DIAGNOSIS — Z9981 Dependence on supplemental oxygen: Secondary | ICD-10-CM | POA: Diagnosis not present

## 2018-08-30 DIAGNOSIS — Z9181 History of falling: Secondary | ICD-10-CM | POA: Diagnosis not present

## 2018-09-01 DIAGNOSIS — Z9981 Dependence on supplemental oxygen: Secondary | ICD-10-CM | POA: Diagnosis not present

## 2018-09-01 DIAGNOSIS — S81812D Laceration without foreign body, left lower leg, subsequent encounter: Secondary | ICD-10-CM | POA: Diagnosis not present

## 2018-09-01 DIAGNOSIS — M150 Primary generalized (osteo)arthritis: Secondary | ICD-10-CM | POA: Diagnosis not present

## 2018-09-01 DIAGNOSIS — J84112 Idiopathic pulmonary fibrosis: Secondary | ICD-10-CM | POA: Diagnosis not present

## 2018-09-01 DIAGNOSIS — I10 Essential (primary) hypertension: Secondary | ICD-10-CM | POA: Diagnosis not present

## 2018-09-01 DIAGNOSIS — Z9181 History of falling: Secondary | ICD-10-CM | POA: Diagnosis not present

## 2018-09-03 ENCOUNTER — Encounter: Payer: Self-pay | Admitting: Family Medicine

## 2018-09-03 ENCOUNTER — Encounter: Payer: Medicare Other | Attending: Physician Assistant | Admitting: Physician Assistant

## 2018-09-03 ENCOUNTER — Ambulatory Visit (INDEPENDENT_AMBULATORY_CARE_PROVIDER_SITE_OTHER): Payer: Medicare Other | Admitting: Family Medicine

## 2018-09-03 VITALS — BP 122/62 | HR 73 | Temp 98.6°F | Ht 60.0 in | Wt 152.0 lb

## 2018-09-03 DIAGNOSIS — R059 Cough, unspecified: Secondary | ICD-10-CM

## 2018-09-03 DIAGNOSIS — L97822 Non-pressure chronic ulcer of other part of left lower leg with fat layer exposed: Secondary | ICD-10-CM | POA: Diagnosis not present

## 2018-09-03 DIAGNOSIS — M1990 Unspecified osteoarthritis, unspecified site: Secondary | ICD-10-CM | POA: Insufficient documentation

## 2018-09-03 DIAGNOSIS — J8410 Pulmonary fibrosis, unspecified: Secondary | ICD-10-CM | POA: Diagnosis not present

## 2018-09-03 DIAGNOSIS — R05 Cough: Secondary | ICD-10-CM | POA: Diagnosis not present

## 2018-09-03 DIAGNOSIS — I10 Essential (primary) hypertension: Secondary | ICD-10-CM | POA: Diagnosis not present

## 2018-09-03 DIAGNOSIS — Z09 Encounter for follow-up examination after completed treatment for conditions other than malignant neoplasm: Secondary | ICD-10-CM | POA: Diagnosis not present

## 2018-09-03 MED ORDER — AZITHROMYCIN 250 MG PO TABS: ORAL_TABLET | ORAL | 0 refills | Status: AC

## 2018-09-03 MED ORDER — BENZONATATE 100 MG PO CAPS: 100.0000 mg | ORAL_CAPSULE | Freq: Three times a day (TID) | ORAL | 0 refills | Status: AC | PRN

## 2018-09-03 NOTE — Patient Instructions (Signed)
.   Please bring all of your medications to every appointment so we can make sure that our medication list is the same as yours.

## 2018-09-03 NOTE — Progress Notes (Signed)
Patient: [name] Female    DOB: [birth year]   83 y.o.   MRN: [id number] Visit Date: 09/03/2018  Today's Provider: Lelon Huh, MD   Chief Complaint  Patient presents with  . URI   Subjective:     URI   This is a new problem. Episode onset: Started about 5 days ago. The problem has been gradually worsening. There has been no fever. Associated symptoms include congestion, coughing, diarrhea, headaches, a sore throat and wheezing (Chronic issue). Pertinent negatives include no abdominal pain, ear pain, nausea, neck pain, plugged ear sensation, rhinorrhea, sinus pain, sneezing, swollen glands or vomiting. Treatments tried: Delsym. The treatment provided no relief.  No more short of breath this week than usual.    No Known Allergies   Current Outpatient Medications:  .  albuterol (PROVENTIL HFA;VENTOLIN HFA) 108 (90 Base) MCG/ACT inhaler, Inhale into the lungs., Disp: , Rfl:  .  citalopram (CELEXA) 20 MG tablet, , Disp: , Rfl:  .  Cyanocobalamin (B-12) 2500 MCG TABS, Take 2,500 mcg by mouth daily., Disp: , Rfl:  .  ESBRIET 267 MG TABS, Take 6 tablets by mouth daily. , Disp: , Rfl:  .  estradiol (ESTRACE VAGINAL) 0.1 MG/GM vaginal cream, Place 1 Applicatorful vaginally once a week. (1gram weekly), Disp: 42.5 g, Rfl: 2 .  HYDROmorphone (DILAUDID) 2 MG tablet, Take 2 mg by mouth 2 (two) times daily. , Disp: , Rfl:  .  meloxicam (MOBIC) 7.5 MG tablet, Take 7.5 mg by mouth daily., Disp: , Rfl:  .  nystatin ointment (MYCOSTATIN), Apply 1 application topically 2 (two) times daily., Disp: 30 g, Rfl: 0 .  OXYGEN, Inhale 2 L into the lungs as needed., Disp: , Rfl:  .  VOLTAREN 1 % GEL, , Disp: , Rfl: 2 .  zoledronic acid (RECLAST) 5 MG/100ML SOLN injection, Inject into the vein., Disp: , Rfl:  .  OXYQUINOLONE SULFATE VAGINAL (TRIMO-SAN) 0.025 % GEL, Place 1 Applicatorful vaginally every 14 (fourteen) days. (Patient not taking: Reported on 09/03/2018), Disp: 45 g, Rfl: 1  Review of  Systems  Constitutional: Positive for appetite change (No appetite) and fatigue. Negative for activity change, chills, diaphoresis, fever and unexpected weight change.  HENT: Positive for congestion, sore throat and voice change. Negative for ear discharge, ear pain, hearing loss, postnasal drip, rhinorrhea, sinus pressure, sinus pain, sneezing, tinnitus and trouble swallowing.   Eyes: Negative.   Respiratory: Positive for cough, chest tightness, shortness of breath and wheezing (Chronic issue). Negative for apnea.   Gastrointestinal: Positive for diarrhea. Negative for abdominal distention, abdominal pain, anal bleeding, blood in stool, constipation, nausea, rectal pain and vomiting.  Musculoskeletal: Positive for neck stiffness. Negative for neck pain.  Neurological: Positive for light-headedness and headaches. Negative for dizziness.  Hematological: Negative for adenopathy.    Social History   Tobacco Use  . Smoking status: Never Smoker  . Smokeless tobacco: Never Used  Substance Use Topics  . Alcohol use: No      Objective:   BP 122/62 (BP Location: Right Arm, Patient Position: Sitting, Cuff Size: Normal)   Pulse 73   Temp 98.6 F (37 C) (Oral)   Ht 5' (1.524 m)   Wt 152 lb (68.9 kg) Comment: Pt reported  SpO2 97%   BMI 29.69 kg/m  Vitals:   09/03/18 1010  BP: 122/62  Pulse: 73  Temp: 98.6 F (37 C)  TempSrc: Oral  SpO2: 97%  Weight: 152 lb (68.9 kg)  Height: 5' (1.524 m)     Physical Exam  General Appearance:    Alert, cooperative, no distress  HENT:   bilateral TM normal without fluid or infection and nasal mucosa congested  Eyes:    PERRL, conjunctiva/corneas clear, EOM's intact       Lungs:     Occasional expiratory wheeze, no rales, , respirations unlabored  Heart:    Regular rate and rhythm  Neurologic:   Awake, alert, oriented x 3. No apparent focal neurological           defect.           Assessment & Plan        Lelon Huh, MD  [facility]

## 2018-09-04 NOTE — Progress Notes (Signed)
[name] ([id number]) Visit Report for 09/03/2018 Arrival Information Details Patient Name: [name], [name]. Date of Service: 09/03/2018 11:30 AM Medical Record Number: [id number] Patient Account Number: [id number] Date of Birth/Sex: September 21, 1935 (84 y.o. [name]) Treating RN: [name] Primary Care [name]: [name] Other Clinician: Referring [name]: [name] Treating [name]/Extender: [name], [name] in Treatment: 19 Visit Information History Since Last Visit Added or deleted any medications: No Patient Arrived: Ambulatory Any new allergies or adverse reactions: No Arrival Time: 11:03 Had a fall or experienced change in No Accompanied By: self activities of daily living that may affect Transfer Assistance: None risk of falls: Patient Identification Verified: Yes Signs or symptoms of abuse/neglect since last visito No Secondary Verification Process Completed: Yes Hospitalized since last visit: No Implantable device outside of the clinic excluding No cellular tissue based products placed in the center since last visit: Has Dressing in Place as Prescribed: Yes Pain Present Now: No Electronic Signature(s) Signed: 09/03/2018 3:54:51 PM By: [name] [name], [name], [name] Entered By: [name] on 09/03/2018 11:04:08 [name] ([id number]) -------------------------------------------------------------------------------- Clinic Level of Care Assessment Details Patient Name: [name], [name]. Date of Service: 09/03/2018 11:30 AM Medical Record Number: [id number] Patient Account Number: [id number] Date of Birth/Sex: September 20, 1935 (83 y.o. [name]) Treating RN: [name] Primary Care [name]: [name] Other Clinician: Referring [name]: [name] Treating [name]/Extender: [name], [name] in Treatment: 19 Clinic Level of Care Assessment Items TOOL 4 Quantity Score []  - Use when only an  EandM is performed on FOLLOW-UP visit 0 ASSESSMENTS - Nursing Assessment / Reassessment X - Reassessment of Co-morbidities (includes updates in patient status) 1 10 X- 1 5 Reassessment of Adherence to Treatment Plan ASSESSMENTS - Wound and Skin Assessment / Reassessment X - Simple Wound Assessment / Reassessment - one wound 1 5 []  - 0 Complex Wound Assessment / Reassessment - multiple wounds []  - 0 Dermatologic / Skin Assessment (not related to wound area) ASSESSMENTS - Focused Assessment []  - Circumferential Edema Measurements - multi extremities 0 []  - 0 Nutritional Assessment / Counseling / Intervention []  - 0 Lower Extremity Assessment (monofilament, tuning fork, pulses) []  - 0 Peripheral Arterial Disease Assessment (using hand held doppler) ASSESSMENTS - Ostomy and/or Continence Assessment and Care []  - Incontinence Assessment and Management 0 []  - 0 Ostomy Care Assessment and Management (repouching, etc.) PROCESS - Coordination of Care X - Simple Patient / Family Education for ongoing care 1 15 []  - 0 Complex (extensive) Patient / Family Education for ongoing care X- 1 10 Staff obtains Chiropractor, Records, Test Results / Process Orders []  - 0 Staff telephones HHA, Nursing Homes / Clarify orders / etc []  - 0 Routine Transfer to another Facility (non-emergent condition) []  - 0 Routine Hospital Admission (non-emergent condition) []  - 0 New Admissions / Manufacturing engineer / Ordering NPWT, Apligraf, etc. []  - 0 Emergency Hospital Admission (emergent condition) X- 1 10 Simple Discharge Coordination [name], [name] ([id number]) []  - 0 Complex (extensive) Discharge Coordination PROCESS - Special Needs []  - Pediatric / Minor Patient Management 0 []  - 0 Isolation Patient Management []  - 0 Hearing / Language / Visual special needs []  - 0 Assessment of Community assistance (transportation, D/C planning, etc.) []  - 0 Additional assistance / Altered mentation []  -  0 Support Surface(s) Assessment (bed, cushion, seat, etc.) INTERVENTIONS - Wound Cleansing / Measurement X - Simple Wound Cleansing - one wound 1 5 []  - 0 Complex Wound Cleansing - multiple wounds  X- 1 5 Wound Imaging (photographs - any number of wounds) []  - 0 Wound Tracing (instead of photographs) X- 1 5 Simple Wound Measurement - one wound []  - 0 Complex Wound Measurement - multiple wounds INTERVENTIONS - Wound Dressings X - Small Wound Dressing one or multiple wounds 1 10 []  - 0 Medium Wound Dressing one or multiple wounds []  - 0 Large Wound Dressing one or multiple wounds []  - 0 Application of Medications - topical []  - 0 Application of Medications - injection INTERVENTIONS - Miscellaneous []  - External ear exam 0 []  - 0 Specimen Collection (cultures, biopsies, blood, body fluids, etc.) []  - 0 Specimen(s) / Culture(s) sent or taken to Lab for analysis []  - 0 Patient Transfer (multiple staff / Nurse, adult / Similar devices) []  - 0 Simple Staple / Suture removal (25 or less) []  - 0 Complex Staple / Suture removal (26 or more) []  - 0 Hypo / Hyperglycemic Management (close monitor of Blood Glucose) []  - 0 Ankle / Brachial Index (ABI) - do not check if billed separately X- 1 5 Vital Signs [name], [name] ([id number]) Has the patient been seen at the hospital within the last three years: Yes Total Score: 85 Level Of Care: New/Established - Level 3 Electronic Signature(s) Signed: 09/03/2018 3:53:38 PM By: [name] Entered By: [name] on 09/03/2018 11:34:04 [name] ([id number]) -------------------------------------------------------------------------------- Encounter Discharge Information Details Patient Name: [name]. Date of Service: 09/03/2018 11:30 AM Medical Record Number: [id number] Patient Account Number: [id number] Date of Birth/Sex: 1936/02/10 (83 y.o. [name]) Treating RN: [name] Primary Care [name]: [name] Other Clinician: Referring [name]: [name] Treating [name]/Extender: [name], [name] in Treatment: 19 Encounter Discharge Information Items Discharge Condition: Stable Ambulatory Status: Ambulatory Discharge Destination: Home Transportation: Private Auto Accompanied By: self Schedule Follow-up Appointment: Yes Clinical Summary of Care: Electronic Signature(s) Signed: 09/03/2018 3:53:38 PM By: [name] Entered By: [name] on 09/03/2018 11:44:56 [place], [name] ([id number]) -------------------------------------------------------------------------------- Lower Extremity Assessment Details Patient Name: [name], [name]. Date of Service: 09/03/2018 11:30 AM Medical Record Number: [id number] Patient Account Number: [id number] Date of Birth/Sex: 16-Aug-1936 (83 y.o. [name]) Treating RN: [name] Primary Care Lamaj Metoyer: [name] Other Clinician: Referring Rosha Cocker: [name] Treating Maicey Barrientez/Extender: [name], [name] in Treatment: 19 Electronic Signature(s) Signed: 09/03/2018 3:53:38 PM By: [name] Entered By: [name] on 09/03/2018 11:10:15 Tigue, [name] ([id number]) -------------------------------------------------------------------------------- Multi Wound Chart Details Patient Name: [name], [name]. Date of Service: 09/03/2018 11:30 AM Medical Record Number: [id number] Patient Account Number: [id number] Date of Birth/Sex: 1936-04-07 (83 y.o. [name]) Treating RN: [name] Primary Care Aletta Edmunds: [name] Other Clinician: Referring Caleigha Zale: [name] Treating Brittanee Ghazarian/Extender: [name], [name] in Treatment: 19 Vital Signs Height(in): 60 Pulse(bpm): 68 Weight(lbs): 151 Blood Pressure(mmHg): 129/60 Body Mass Index(BMI): 29 Temperature([name]): 98.2 Respiratory Rate 18 (breaths/min): Photos: [3:No Photos] [N/A:N/A] Wound Location: [3:Left Lower Leg - Lateral]  [N/A:N/A] Wounding Event: [3:Trauma] [N/A:N/A] Primary Etiology: [3:Skin Tear] [N/A:N/A] Comorbid History: [3:Hypertension, Osteoarthritis] [N/A:N/A] Date Acquired: [3:04/20/2018] [N/A:N/A] [name] of Treatment: [3:19] [N/A:N/A] Wound Status: [3:Open] [N/A:N/A] Measurements L x W x D [3:0.4x0.5x0.1] [N/A:N/A] (cm) Area (cm) : [3:0.157] [N/A:N/A] Volume (cm) : [3:0.016] [N/A:N/A] % Reduction in Area: [3:99.90%] [N/A:N/A] % Reduction in Volume: [3:99.90%] [N/A:N/A] Classification: [3:Full Thickness Without Exposed Support Structures] [N/A:N/A] Exudate Amount: [3:Small] [N/A:N/A] Exudate Type: [3:Serous] [N/A:N/A] Exudate Color: [3:amber] [N/A:N/A] Wound Margin: [3:Flat and Intact] [N/A:N/A] Granulation Amount: [3:None Present (0%)] [N/A:N/A] Necrotic Amount: [3:Medium (34-66%)] [N/A:N/A] Necrotic Tissue: [  3:Eschar] [N/A:N/A] Exposed Structures: [3:Fat Layer (Subcutaneous Tissue) Exposed: Yes Fascia: No Tendon: No Muscle: No Joint: No Bone: No] [N/A:N/A] Epithelialization: [3:Large (67-100%)] [N/A:N/A] Periwound Skin Texture: [3:Excoriation: No Induration: No Callus: No Crepitus: No Rash: No Scarring: No] [N/A:N/A] Periwound Skin Moisture: Maceration: No N/A N/A Dry/Scaly: No Periwound Skin Color: Hemosiderin Staining: Yes N/A N/A Atrophie Blanche: No Cyanosis: No Ecchymosis: No Erythema: No Mottled: No Pallor: No Rubor: No Temperature: No Abnormality N/A N/A Tenderness on Palpation: Yes N/A N/A Wound Preparation: Ulcer Cleansing: N/A N/A Rinsed/Irrigated with Saline Topical Anesthetic Applied: Other: lidocaine 4% Treatment Notes Electronic Signature(s) Signed: 09/03/2018 3:53:38 PM By: [name] Entered By: [name] on 09/03/2018 11:32:39 [name], [name] ([id number]) -------------------------------------------------------------------------------- Multi-Disciplinary Care Plan Details Patient Name: [name], [name]. Date of Service: 09/03/2018 11:30  AM Medical Record Number: [id number] Patient Account Number: [id number] Date of Birth/Sex: 1936/08/24 (83 y.o. [name]) Treating RN: [name] Primary Care Cadyn Fann: [name] Other Clinician: Referring Sanskriti Greenlaw: [name] Treating Nazaire Cordial/Extender: [name], [name] in Treatment: 70 Active Inactive Abuse / Safety / Falls / Self Care Management Nursing Diagnoses: Impaired physical mobility Goals: Patient will remain injury free related to falls Date Initiated: 04/23/2018 Target Resolution Date: 07/03/2018 Goal Status: Active Interventions: Assess fall risk on admission and as needed Notes: Orientation to the Wound Care Program Nursing Diagnoses: Knowledge deficit related to the wound healing center program Goals: Patient/caregiver will verbalize understanding of the Wound Healing Center Program Date Initiated: 04/23/2018 Target Resolution Date: 07/03/2018 Goal Status: Active Interventions: Provide education on orientation to the wound center Notes: Pain, Acute or Chronic Nursing Diagnoses: Pain, acute or chronic: actual or potential Goals: Patient/caregiver will verbalize adequate pain control between visits Date Initiated: 04/23/2018 Target Resolution Date: 07/03/2018 Goal Status: Active Interventions: Assess comfort goal upon admission [name], [name] ([id number]) Notes: Wound/Skin Impairment Nursing Diagnoses: Impaired tissue integrity Goals: Ulcer/skin breakdown will heal within 14 [name] Date Initiated: 04/23/2018 Target Resolution Date: 07/03/2018 Goal Status: Active Interventions: Assess patient/caregiver ability to obtain necessary supplies Assess patient/caregiver ability to perform ulcer/skin care regimen upon admission and as needed Assess ulceration(s) every visit Notes: Electronic Signature(s) Signed: 09/03/2018 3:53:38 PM By: [name] Entered By: [name] on 09/03/2018 11:32:29 Duke, [name]  ([id number]) -------------------------------------------------------------------------------- Pain Assessment Details Patient Name: [name], [name]. Date of Service: 09/03/2018 11:30 AM Medical Record Number: [id number] Patient Account Number: [id number] Date of Birth/Sex: 07-29-1936 (83 y.o. [name]) Treating RN: [name] Primary Care Konor Noren: [name] Other Clinician: Referring Annali Lybrand: [name] Treating Rody Keadle/Extender: [name], [name] in Treatment: 71 Active Problems Location of Pain Severity and Description of Pain Patient Has Paino No Site Locations Pain Management and Medication Current Pain Management: Electronic Signature(s) Signed: 09/03/2018 3:54:51 PM By: Sallee Provencal, [name], [name] Signed: 09/03/2018 4:35:36 PM By: [name] Entered By: [name] on 09/03/2018 11:04:17 [name] ([id number]) -------------------------------------------------------------------------------- Patient/Caregiver Education Details Patient Name: [name], [name]. Date of Service: 09/03/2018 11:30 AM Medical Record Number: [id number] Patient Account Number: [id number] Date of Birth/Gender: 1935-09-15 (83 y.o. [name]) Treating RN: [name] Primary Care Physician: [name] Other Clinician: Referring Physician: Joycelyn [name] Treating Physician/Extender: Skeet Simmer in Treatment: 44 Education Assessment Education Provided To: Patient Education Topics Provided Wound/Skin Impairment: Handouts: Caring for Your Ulcer Methods: Demonstration, Explain/Verbal Responses: State content correctly Electronic Signature(s) Signed: 09/03/2018 3:53:38 PM By: [name] Entered By: [name] on 09/03/2018 11:45:01 Noland, [name] ([id number]) -------------------------------------------------------------------------------- Wound Assessment Details Patient Name: [name], [name].  Date of  Service: 09/03/2018 11:30 AM Medical Record Number: [id number] Patient Account Number: [id number] Date of Birth/Sex: 1936/07/28 (83 y.o. [name]) Treating RN: [name] Primary Care Malli Falotico: [name] Other Clinician: Referring Lynden Carrithers: [name] Treating Myrian Botello/Extender: [name], [name] in Treatment: 19 Wound Status Wound Number: 3 Primary Etiology: Skin Tear Wound Location: Left Lower Leg - Lateral Wound Status: Open Wounding Event: Trauma Comorbid History: Hypertension, Osteoarthritis Date Acquired: 04/20/2018 [name] Of Treatment: 19 Clustered Wound: No Photos Photo Uploaded By: [name] on 09/03/2018 15:08:18 Wound Measurements Length: (cm) 0.4 Width: (cm) 0.5 Depth: (cm) 0.1 Area: (cm) 0.157 Volume: (cm) 0.016 % Reduction in Area: 99.9% % Reduction in Volume: 99.9% Epithelialization: Large (67-100%) Tunneling: No Wound Description Full Thickness Without Exposed Support Foul Classification: Structures Sloug Wound Margin: Flat and Intact Exudate Small Amount: Exudate Type: Serous Exudate Color: amber Odor After Cleansing: No h/Fibrino No Wound Bed Granulation Amount: None Present (0%) Exposed Structure Necrotic Amount: Medium (34-66%) Fascia Exposed: No Necrotic Quality: Eschar Fat Layer (Subcutaneous Tissue) Exposed: Yes Tendon Exposed: No Muscle Exposed: No Joint Exposed: No Bone Exposed: No [place], Infiniti J. ([id number]) Periwound Skin Texture Texture Color No Abnormalities Noted: No No Abnormalities Noted: No Callus: No Atrophie Blanche: No Crepitus: No Cyanosis: No Excoriation: No Ecchymosis: No Induration: No Erythema: No Rash: No Hemosiderin Staining: Yes Scarring: No Mottled: No Pallor: No Moisture Rubor: No No Abnormalities Noted: No Dry / Scaly: No Temperature / Pain Maceration: No Temperature: No Abnormality Tenderness on Palpation: Yes Wound Preparation Ulcer Cleansing: Rinsed/Irrigated with  Saline Topical Anesthetic Applied: Other: lidocaine 4%, Treatment Notes Wound #3 (Left, Lateral Lower Leg) Notes Hydrafera Blue, gauze, conform secured with ace wrap Electronic Signature(s) Signed: 09/03/2018 3:53:38 PM By: [name] Entered By: [name] on 09/03/2018 11:10:06 [place], [name] ([id number]) -------------------------------------------------------------------------------- Vitals Details Patient Name: [name]. Date of Service: 09/03/2018 11:30 AM Medical Record Number: [id number] Patient Account Number: [id number] Date of Birth/Sex: [birth year] (83 y.o. [name]) Treating RN: [name] Primary Care Arlina Sabina: [name] Other Clinician: Referring Kyera Felan: [name] Treating Azalee Weimer/Extender: [name], [name] in Treatment: 19 Vital Signs Time Taken: 11:05 Temperature ([name]): 98.2 Height (in): 60 Pulse (bpm): 68 Weight (lbs): 151 Respiratory Rate (breaths/min): 18 Body Mass Index (BMI): 29.5 Blood Pressure (mmHg): 129/60 Reference Range: 80 - 120 mg / dl Electronic Signature(s) Signed: 09/03/2018 3:53:38 PM By: [name] Entered By: [name] on 09/03/2018 11:06:14

## 2018-09-05 ENCOUNTER — Emergency Department
Admission: EM | Admit: 2018-09-05 | Discharge: 2018-09-05 | Disposition: A | Discharge status: Home / Self Care | Payer: Medicare Other | Attending: Emergency Medicine | Admitting: Emergency Medicine

## 2018-09-05 ENCOUNTER — Emergency Department: Payer: Medicare Other

## 2018-09-05 ENCOUNTER — Other Ambulatory Visit: Payer: Self-pay

## 2018-09-05 DIAGNOSIS — M79672 Pain in left foot: Secondary | ICD-10-CM | POA: Diagnosis not present

## 2018-09-05 DIAGNOSIS — Z79899 Other long term (current) drug therapy: Secondary | ICD-10-CM | POA: Insufficient documentation

## 2018-09-05 DIAGNOSIS — I10 Essential (primary) hypertension: Secondary | ICD-10-CM | POA: Diagnosis not present

## 2018-09-05 MED ORDER — DEXAMETHASONE SODIUM PHOSPHATE 10 MG/ML IJ SOLN
10.0000 mg | Freq: Once | INTRAMUSCULAR | Status: AC
  Administered 2018-09-05: 10 mg via INTRAMUSCULAR
  Filled 2018-09-05: qty 1

## 2018-09-05 MED ORDER — MELOXICAM 7.5 MG PO TABS: 7.5000 mg | ORAL_TABLET | Freq: Every day | ORAL | 1 refills | Status: AC

## 2018-09-05 NOTE — ED Triage Notes (Signed)
Reports left foot pain with no injury.  Patient reports she has been seeing orth for her back and they felt of her foot and told her it was arthritis.  Today pain worse and unable to bear weight.

## 2018-09-05 NOTE — ED Provider Notes (Signed)
[facility] Emergency Department Provider Note  ____________________________________________  Time seen: Approximately 9:12 PM  I have reviewed the triage vital signs and the nursing notes.   HISTORY  Chief Complaint Foot Pain    HPI [name] is a 83 y.o. female presents to the emergency department with acute 10 out of 10 left foot pain along the distribution of the fourth and fifth metatarsals that started today.  Patient has a history of left foot arthritis.  No falls or mechanisms of trauma.  Patient denies numbness or tingling in the affected area.  Patient had a cortisone injection administered by Dr. Sabra Heck 3 months ago when patient had similar symptoms.  Patient reports that she experienced significant pain relief after injection.  Patient has attempted no alleviating measures today.   Past Medical History:  Diagnosis Date  . Altered mental state   . Anxiety   . BMI 30.0-30.9,adult   . Cellulitis   . Chest pain, atypical   . Compression fracture of L4 lumbar vertebra   . Constipation   . Cystitis   . Cystocele   . Diverticulosis   . Fatigue   . 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 Gastroenteritis   . History of back surgery   . History of herniated intervertebral disc   . HTN (hypertension)   . Hypercholesteremia   . Hypocalcemia   . Incontinence of urine   . OA (osteoarthritis)    shoulder and back  . Obesity   . Osteopenia   . Pneumonia   . Shortness of breath   . Sleep apnea   . Thrush   . Vitamin D deficiency     Patient Active Problem List   Diagnosis Date Noted  . Vitamin D deficiency 03/23/2018  . 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 Tinea corporis 04/26/2016  . Family history of colon cancer requiring screening colonoscopy 04/26/2016  . Slow transit constipation 04/26/2016  . Candidiasis 01/17/2016  . Hypercholesteremia 04/24/2015  . Mild cognitive impairment 04/24/2015  . Compression fracture of L4 lumbar vertebra  04/24/2015  . Vaginal pessary in situ 04/19/2015  . Cystocele with uterine descensus 03/30/2015  . Anxiety 02/02/2015  . Arthritis 02/02/2015  . Hypertension 02/02/2015  . Sleep apnea 02/02/2015  . 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 CATARACT EXTRACTION W/PHACO Left 04/24/2016   Procedure: CATARACT EXTRACTION PHACO AND INTRAOCULAR LENS PLACEMENT (IOC);  Surgeon: Leandrew Koyanagi, MD;  Location: [place];  Service: Ophthalmology;  Laterality: Left;  . CATARACT EXTRACTION W/PHACO Right 07/14/2017   Procedure: CATARACT EXTRACTION PHACO AND INTRAOCULAR LENS PLACEMENT ([place])  RIGHT;  Surgeon: Leandrew Koyanagi, MD;  Location: [place];  Service: Ophthalmology;  Laterality: Right;  . ROTATOR CUFF REPAIR Bilateral    left-12/2009; right-03/2009 dr.califf  . TOTAL SHOULDER REPLACEMENT Left     Prior to Admission medications   Medication Sig Start Date End Date Taking? Authorizing Provider  albuterol (PROVENTIL HFA;VENTOLIN HFA) 108 (90 Base) MCG/ACT inhaler Inhale into the lungs. 06/24/18 06/24/19  [provider]  azithromycin (ZITHROMAX) 250 MG tablet 2 by mouth today, then 1 daily for 4 days 09/03/18 09/08/18  Birdie Sons, MD  benzonatate (TESSALON) 100 MG capsule Take 1 capsule (100 mg total) by mouth 3 (three) times daily as needed for up to 5 days for cough. 09/03/18 09/08/18  Birdie Sons, MD  citalopram (CELEXA) 20 MG tablet  07/31/18   [provider]  Cyanocobalamin (B-12) 2500 MCG TABS Take 2,500 mcg by mouth daily.    [provider]  ESBRIET 267 MG TABS Take 6 tablets by mouth daily.  02/25/17   [provider]  estradiol (ESTRACE VAGINAL) 0.1 MG/GM vaginal cream Place 1 Applicatorful vaginally once a week. (1gram weekly) 06/16/18   Rubie Maid, MD  HYDROmorphone (DILAUDID) 2 MG tablet Take 2 mg by mouth 2 (two) times daily.     [provider]  meloxicam (MOBIC) 7.5 MG tablet Take 1 tablet (7.5 mg total) by mouth daily for 7 days. 09/05/18 09/12/18  Lannie Fields, PA-C  nystatin ointment (MYCOSTATIN) Apply 1 application topically 2 (two) times daily. 08/29/17   Rubie Maid, MD  OXYGEN Inhale 2 L into the lungs as needed.    [provider]  OXYQUINOLONE SULFATE VAGINAL (TRIMO-SAN) 0.025 % GEL Place 1 Applicatorful vaginally every 14 (fourteen) days. Patient not taking: Reported on 09/03/2018 05/08/16   Rubie Maid, MD  VOLTAREN 1 % GEL  12/23/17   [provider]  zoledronic acid (RECLAST) 5 MG/100ML SOLN injection Inject into the vein.    [provider]    Allergies Patient has no known allergies.  Family History  Problem Relation Age of Onset  . COPD Mother   . Cancer Brother        colon    Social History Social History   Tobacco Use  . Smoking status: Never Smoker  . Smokeless tobacco: Never Used  Substance Use Topics  . Alcohol use: No  . Drug use: No     Review of Systems  Constitutional: No fever/chills Eyes: No visual changes. No discharge ENT: No upper respiratory complaints. Cardiovascular: no chest pain. Respiratory: no cough. No SOB. Gastrointestinal: No abdominal pain.  No nausea, no vomiting.  No diarrhea.  No constipation. Genitourinary: Negative for dysuria. No hematuria Musculoskeletal: Patient has left foot pain.  Skin: Negative for rash, abrasions, lacerations, ecchymosis. Neurological: Negative for headaches, focal weakness or numbness.   ____________________________________________   PHYSICAL EXAM:  VITAL SIGNS: ED Triage Vitals  Enc Vitals Group     BP 09/05/18 1917 119/81     Pulse Rate 09/05/18 1917 98     Resp 09/05/18 1917 17     Temp 09/05/18 1917 98.4 F (36.9 C)     Temp Source 09/05/18 1917 Oral     SpO2 09/05/18 1917 96 %     Weight 09/05/18 1917 152 lb (68.9 kg)     Height 09/05/18 1917 5' (1.524 m)     Head Circumference  --      Peak Flow --      Pain Score 09/05/18 1921 9     Pain Loc --      Pain Edu? --      Excl. in [place]? --      Constitutional: Alert and oriented. Well appearing and in no acute distress. Eyes: Conjunctivae are normal. PERRL. EOMI. Head: Atraumatic. Cardiovascular: Normal rate, regular rhythm. Normal S1 and S2.  Good peripheral circulation. Respiratory: Normal respiratory effort without tachypnea or retractions. Lungs CTAB. Good air entry to the bases with no decreased or absent breath sounds. Gastrointestinal: Bowel sounds 4 quadrants. Soft and nontender to palpation. No guarding or rigidity. No palpable masses. No distention. No CVA tenderness. Musculoskeletal: Full range of motion to all extremities. No gross deformities appreciated.  Patient has tenderness to palpation over the left fourth and fifth metatarsals.  Palpable dorsalis pedis pulse bilaterally and  symmetrically. Neurologic:  Normal speech and language. No gross focal neurologic deficits are appreciated.  Skin:  Skin is warm, dry and intact. No rash noted. Psychiatric: Mood and affect are normal. Speech and behavior are normal. Patient exhibits appropriate insight and judgement.   ____________________________________________   LABS (all labs ordered are listed, but only abnormal results are displayed)  Labs Reviewed - No data to display ____________________________________________  EKG   ____________________________________________  RADIOLOGY I personally viewed and evaluated these images as part of my medical decision making, as well as reviewing the written report by the radiologist.  Dg Foot Complete Left  Result Date: 09/05/2018 CLINICAL DATA:  LEFT foot pain, no injury EXAM: LEFT FOOT - COMPLETE 3+ VIEW COMPARISON:  04/17/2017 FINDINGS: Osseous demineralization. Deformity of the distal first metatarsal from remote osteotomy. Degenerative changes first MTP joint and at TMT joints. Remain joint spaces  preserved. No acute fracture, dislocation, or bone destruction. Mild soft tissue swelling overlying the dorsum of the foot at the level of the distal metatarsals. Small vessel vascular calcifications at ankle. IMPRESSION: Scattered degenerative changes as above. No acute abnormalities. Electronically Signed   By: Lavonia Dana M.D.   On: 09/05/2018 20:05    ____________________________________________    PROCEDURES  Procedure(s) performed:    Procedures    Medications  dexamethasone (DECADRON) injection 10 mg (has no administration in time range)     ____________________________________________   INITIAL IMPRESSION / ASSESSMENT AND PLAN / ED COURSE  Pertinent labs & imaging results that were available during my care of the patient were reviewed by me and considered in my medical decision making (see chart for details).  Review of the  [place]RS was performed in accordance of the [place] prior to dispensing any controlled drugs.      Assessment and plan Left foot pain Patient presents to the emergency department with acute left foot pain.  X-ray examination of the left foot reveals no acute bony abnormalities.  Patient received an injection of Decadron in the emergency department.  I advised patient to continue taking meloxicam for pain as recommended by Dr. Sabra Heck and to follow-up with Dr. Sabra Heck for a possible repeat cortisone injection.  Patient also reports that she has Voltaren gel at home.  All patient questions were answered.    ____________________________________________  FINAL CLINICAL IMPRESSION(S) / ED DIAGNOSES  Final diagnoses:  Left foot pain      NEW MEDICATIONS STARTED DURING THIS VISIT:  ED Discharge Orders         Ordered    meloxicam (MOBIC) 7.5 MG tablet  Daily     09/05/18 2109              This chart was dictated using voice recognition software/Dragon. Despite best efforts to proofread, errors can occur which can change the meaning. Any  change was purely unintentional.    Karren Cobble 09/05/18 2116    Nance Pear, MD 09/05/18 [phone]

## 2018-09-05 NOTE — Progress Notes (Signed)
Terri, [name] ([id number]) Visit Report for 09/03/2018 Chief Complaint Document Details Patient Name: [name], [name]. Date of Service: 09/03/2018 11:30 AM Medical Record Number: [id number] Patient Account Number: [id number] Date of Birth/Sex: 1936-02-24 (83 y.o. F) Treating RN: Montey Hora Primary Care Provider: Fenton Malling Other Clinician: Referring Provider: Fenton Malling Treating Provider/Extender: Melburn Hake, HOYT Weeks in Treatment: 72 Information Obtained from: Patient Chief Complaint LLE Ulcers Electronic Signature(s) Signed: 09/03/2018 11:59:27 PM By: [name] PA-C Entered By: [name] on 09/03/2018 11:03:28 ADYN, HOES ([id number]) -------------------------------------------------------------------------------- HPI Details Patient Name: Terri, [name] Date of Service: 09/03/2018 11:30 AM Medical Record Number: [id number] Patient Account Number: [id number] Date of Birth/Sex: [birth year] (83 y.o. F) Treating RN: Montey Hora Primary Care Provider: Fenton Malling Other Clinician: Referring Provider: Fenton Malling Treating Provider/Extender: Melburn Hake, HOYT Weeks in Treatment: 63 History of Present Illness HPI Description: 04/23/18-She is seen in initial evaluation for multiple skin tears s/p fall on 8/19. She did sustain a closed fracture of the right patella secondary to that fall. She presented to emergency department on 8/19 for treatment. The left leg laceration was unable to be sutured closed, Steri-Strips were applied and she was treated with knee immobilizer for the patella fracture. She continues on Keflex that was initiated on 8/19. The left leg skin tear flap was cleansed and reapproximated, secure with steri strips. She will follow up next week 04/30/18-She is seen in follow-up evaluation for multiple skin tears. She continues with knee immobilizers to the right lower extremity, has developed lower extremity edema with  subsequent weeping; will initiate ace wrap compression. The flap is mostly devitalized, which was somewhat expected; we will change treatment plan and she will follow up next week 05/07/18-She is seen in follow-up evaluation for multiple wounds. The left lateral lower leg skin flap is nonviable/devitalized, now eschar covered. She tolerated debridement fairly well. The right lower extremity skin tear that was new last week has essentially healed. She has responded nicely to Ace wrap compression. We will modify treatment plan and she will follow-up next week 05/14/18 on evaluation today patient actually appears to be doing a little better in regard to her left lower extremity ulcers. With that being said she has been tolerating the dressing changes without complication. Medihoney is what she was switch to dear in the last week's visit. Prior to that we used Iodoflex when she had the eschar present. Nonetheless I believe that the Iodoflex may be the best thing for her at this point in regard to the current wound bed and prevention of biofilm buildup. Nonetheless she seems to be tolerating the dressing changes without complication which is good news. It's really the debridement the calls are the most pain last week she did allow for debridement this week she also agreed to allow me to attempt debridement in order to try to help this wound to heal more quickly. 05/21/18 on evaluation today patient actually appears to be doing much better at this time in regard to her left lateral lower Trinity ulcer. She's been tolerating the dressing changes without complication. Fortunately there does not appear to be any evidence of infection at this time. There is still some Slough noted on the surface of the wound although not as much as previous. She continues to have a lot of discomfort currently. She tells me that once we put the Iodoflex on the last time it actually burned from Thursday till Sunday night and then  eased off. Nonetheless it does  seem to have made a great improvement in general in regard to the patient's wound bed. 05/28/18 patient's wound bed at this point actually show signs of good improvement which is excellent news currently. She has been tolerating the dressing changes without complication. She does have a little bit of slightly hyper granular tissue at this point there's also some necrotic tissue noted on the surface of the wound. There does not appear to be any evidence of infection overall at this time. In general I feel like that she has made good progress in the past two weeks that I have been seeing her. 06/04/18 on evaluation today patient's left lower Trinity ulcer actually appears to be doing very well today. She has great improvement with excellent epithelialization at this time. Overall I feel like she has made great progress even since last time I seen her. There's really no necrotic tissue on the surface of the wound which is also great news her pain appears to be better. 06/18/18 on evaluation today the patient continues to show signs of improvement in regard to her lower extremity ulcer. She's been tolerating the Hydrofera Blue Dressing without complication and the wound bed seems to be doing excellent at this time. Overall I'm very pleased with the progress and were things stand. 07/02/18 on evaluation today patient actually appears to be doing excellent in regard to her left lower Trinity ulcer. This is definitely smaller and is shown signs of good improvement. It is going somewhat slow but nonetheless we have a steady progression of the wound which is great news. Overall very pleased with the progress that has been made. She's having very VALRIE, JIA. ([id number]) little pain at this time. 07/16/18 on evaluation today patient actually appears to be doing very well in regard to her lower extremity ulcer. She's been tolerating the dressing changes without  complication. Fortunately there is no evidence of anything infection wise occurring at this time. She barely is going to require debridement today. 08/06/18 the on evaluation today patient actually appears to be doing much better in regard to her lower Trinity ulcer. In fact this appears to be very close to complete healing. Fortunately there's no sign of infection at this time. The Hydrofera Blue Dressing to continues to stick somewhat to the wound bed unfortunately. 08/20/18 on evaluation today patient actually appears to be doing very well in regard to the left lower extremity ulcer. She has been tolerating the dressing changes without complications. She tolerates this very well. With that being said I do not see any evidence of infection at this time. Overall I think she is making wonderful progress is very close to complete healing. 09/03/17 on evaluation today patient appears to be doing very well in regard to her lower extremity ulcer. She show signs of good improvement which is excellent. When I last evaluated her this was definitely much larger and overall I feel like she is very close to complete healing although there is still a bit of healing left to take place for this to completely resolve. No fevers, chills, nausea, or vomiting noted at this time. Electronic Signature(s) Signed: 09/03/2018 11:59:27 PM By: [name] PA-C Entered By: [name] on 09/03/2018 23:48:19 ALIESE, BRANNUM ([id number]) -------------------------------------------------------------------------------- Physical Exam Details Patient Name: AJAH, VANHOOSE. Date of Service: 09/03/2018 11:30 AM Medical Record Number: [id number] Patient Account Number: [id number] Date of Birth/Sex: 22-Feb-1936 (83 y.o. F) Treating RN: Montey Hora Primary Care Provider: Fenton Malling Other Clinician: Referring Provider:  Fenton Malling Treating Provider/Extender: STONE III, HOYT Weeks in Treatment:  60 Constitutional Well-nourished and well-hydrated in no acute distress. Respiratory normal breathing without difficulty. clear to auscultation bilaterally. Cardiovascular regular rate and rhythm with normal S1, S2. Psychiatric this patient is able to make decisions and demonstrates good insight into disease process. Alert and Oriented x 3. pleasant and cooperative. Notes Patient's wound currently shows good granulation there does not appear to be slough on the surface of the wound no sharp debridement was required at this point. She's very close to complete resolution. Electronic Signature(s) Signed: 09/03/2018 11:59:27 PM By: [name] PA-C Entered By: [name] on 09/03/2018 23:48:48 IRIDIAN, READER ([id number]) -------------------------------------------------------------------------------- Physician Orders Details Patient Name: GWENDLOYN, FORSEE. Date of Service: 09/03/2018 11:30 AM Medical Record Number: [id number] Patient Account Number: [id number] Date of Birth/Sex: Jan 31, 1936 (83 y.o. F) Treating RN: Harold Barban Primary Care Provider: Fenton Malling Other Clinician: Referring Provider: Fenton Malling Treating Provider/Extender: Melburn Hake, HOYT Weeks in Treatment: 57 Verbal / Phone Orders: No Diagnosis Coding ICD-10 Coding Code Description [phone] Laceration without foreign body, left lower leg, sequela S80.212S Abrasion, left knee, sequela S80.211S Abrasion, right knee, sequela L97.822 Non-pressure chronic ulcer of other part of left lower leg with fat layer exposed Wound Cleansing Wound #3 Left,Lateral Lower Leg o Clean wound with Normal Saline. o May Shower, gently pat wound dry prior to applying new dressing. - use Dial antimicrobial soap to wash wound Anesthetic (add to Medication List) Wound #3 Left,Lateral Lower Leg o Topical Lidocaine 4% cream applied to wound bed prior to debridement (In Clinic Only). Primary Wound  Dressing Wound #3 Left,Lateral Lower Leg o Hydrafera Blue Ready Transfer - Add adaptic to wound bed before hydrafera blue, to protect. Secondary Dressing Wound #3 Left,Lateral Lower Leg o ABD and Kerlix/Conform - ace wrap Dressing Change Frequency Wound #3 Left,Lateral Lower Leg o Dressing is to be changed Monday and Thursday. - Home Health does Mondays. Wound Center does Thursdays Follow-up Appointments o Return Appointment in 1 week. Edema Control Wound #3 Left,Lateral Lower Leg o Elevate legs to the level of the heart and pump ankles as often as possible o Other: - ACE wrap Additional Orders / Instructions Wound #3 Left,Lateral Lower Leg o Vitamin A; Vitamin C, Zinc - Please add a multivitamin that has 100% of vitamin A, vitamin C and zinc supplements in it o Increase protein intake. NEENA, BEECHAM ([id number]) o Activity as tolerated Home Health Wound #3 [place] Nurse may visit PRN to address patientos wound care needs. o FACE TO FACE ENCOUNTER: MEDICARE and MEDICAID PATIENTS: I certify that this patient is under my care and that I had a face-to-face encounter that meets the physician face-to-face encounter requirements with this patient on this date. The encounter with the patient was in whole or in part for the following MEDICAL CONDITION: (primary reason for [place]) MEDICAL NECESSITY: I certify, that based on my findings, NURSING services are a medically necessary home health service. HOME BOUND STATUS: I certify that my clinical findings support that this patient is homebound (i.e., Due to illness or injury, pt requires aid of supportive devices such as crutches, cane, wheelchairs, walkers, the use of special transportation or the assistance of another person to leave their place of residence. There is a normal inability to leave the home and doing so requires considerable and  taxing effort. Other absences are for medical reasons / religious services  and are infrequent or of short duration when for other reasons). o If current dressing causes regression in wound condition, may D/C ordered dressing product/s and apply Normal Saline Moist Dressing daily until next [place] / Other MD appointment. Meta of regression in wound condition at [id number]. o Please direct any NON-WOUND related issues/requests for orders to patient's Primary Care Physician Electronic Signature(s) Signed: 09/03/2018 3:53:38 PM By: Harold Barban Signed: 09/03/2018 11:59:27 PM By: [name] PA-C Entered By: Harold Barban on 09/03/2018 11:36:17 Cousin, [name] ([id number]) -------------------------------------------------------------------------------- Problem List Details Patient Name: CLYDA, SMYTH. Date of Service: 09/03/2018 11:30 AM Medical Record Number: [id number] Patient Account Number: [id number] Date of Birth/Sex: [birth year] (83 y.o. F) Treating RN: Montey Hora Primary Care Provider: Fenton Malling Other Clinician: Referring Provider: Fenton Malling Treating Provider/Extender: Melburn Hake, HOYT Weeks in Treatment: 67 Active Problems ICD-10 Evaluated Encounter Code Description Active Date Today Diagnosis S81.812S Laceration without foreign body, left lower leg, sequela 04/23/2018 No Yes S80.212S Abrasion, left knee, sequela 04/23/2018 No Yes S80.211S Abrasion, right knee, sequela 04/23/2018 No Yes L97.822 Non-pressure chronic ulcer of other part of left lower leg with 05/14/2018 No Yes fat layer exposed Inactive Problems Resolved Problems Electronic Signature(s) Signed: 09/03/2018 11:59:27 PM By: [name] PA-C Entered By: [name] on 09/03/2018 11:03:22 JARED, CAHN ([id number]) -------------------------------------------------------------------------------- Progress Note Details Patient Name:  [name]. Date of Service: 09/03/2018 11:30 AM Medical Record Number: [id number] Patient Account Number: [id number] Date of Birth/Sex: Apr 20, 1936 (83 y.o. F) Treating RN: Montey Hora Primary Care Provider: Fenton Malling Other Clinician: Referring Provider: Fenton Malling Treating Provider/Extender: Melburn Hake, HOYT Weeks in Treatment: 29 Subjective Chief Complaint Information obtained from Patient LLE Ulcers History of Present Illness (HPI) 04/23/18-She is seen in initial evaluation for multiple skin tears s/p fall on 8/19. She did sustain a closed fracture of the right patella secondary to that fall. She presented to emergency department on 8/19 for treatment. The left leg laceration was unable to be sutured closed, Steri-Strips were applied and she was treated with knee immobilizer for the patella fracture. She continues on Keflex that was initiated on 8/19. The left leg skin tear flap was cleansed and reapproximated, secure with steri strips. She will follow up next week 04/30/18-She is seen in follow-up evaluation for multiple skin tears. She continues with knee immobilizers to the right lower extremity, has developed lower extremity edema with subsequent weeping; will initiate ace wrap compression. The flap is mostly devitalized, which was somewhat expected; we will change treatment plan and she will follow up next week 05/07/18-She is seen in follow-up evaluation for multiple wounds. The left lateral lower leg skin flap is nonviable/devitalized, now eschar covered. She tolerated debridement fairly well. The right lower extremity skin tear that was new last week has essentially healed. She has responded nicely to Ace wrap compression. We will modify treatment plan and she will follow-up next week 05/14/18 on evaluation today patient actually appears to be doing a little better in regard to her left lower extremity ulcers. With that being said she has been tolerating the  dressing changes without complication. Medihoney is what she was switch to dear in the last week's visit. Prior to that we used Iodoflex when she had the eschar present. Nonetheless I believe that the Iodoflex may be the best thing for her at this point in regard to the current wound bed and prevention of biofilm buildup. Nonetheless she seems to be tolerating the  dressing changes without complication which is good news. It's really the debridement the calls are the most pain last week she did allow for debridement this week she also agreed to allow me to attempt debridement in order to try to help this wound to heal more quickly. 05/21/18 on evaluation today patient actually appears to be doing much better at this time in regard to her left lateral lower Trinity ulcer. She's been tolerating the dressing changes without complication. Fortunately there does not appear to be any evidence of infection at this time. There is still some Slough noted on the surface of the wound although not as much as previous. She continues to have a lot of discomfort currently. She tells me that once we put the Iodoflex on the last time it actually burned from Thursday till Sunday night and then eased off. Nonetheless it does seem to have made a great improvement in general in regard to the patient's wound bed. 05/28/18 patient's wound bed at this point actually show signs of good improvement which is excellent news currently. She has been tolerating the dressing changes without complication. She does have a little bit of slightly hyper granular tissue at this point there's also some necrotic tissue noted on the surface of the wound. There does not appear to be any evidence of infection overall at this time. In general I feel like that she has made good progress in the past two weeks that I have been seeing her. 06/04/18 on evaluation today patient's left lower Trinity ulcer actually appears to be doing very well today.  She has great improvement with excellent epithelialization at this time. Overall I feel like she has made great progress even since last time I seen her. There's really no necrotic tissue on the surface of the wound which is also great news her pain appears to be better. 06/18/18 on evaluation today the patient continues to show signs of improvement in regard to her lower extremity ulcer. She's CARREN, BLAKLEY ([id number]) been tolerating the [facility] Dressing without complication and the wound bed seems to be doing excellent at this time. Overall I'm very pleased with the progress and were things stand. 07/02/18 on evaluation today patient actually appears to be doing excellent in regard to her left lower Trinity ulcer. This is definitely smaller and is shown signs of good improvement. It is going somewhat slow but nonetheless we have a steady progression of the wound which is great news. Overall very pleased with the progress that has been made. She's having very little pain at this time. 07/16/18 on evaluation today patient actually appears to be doing very well in regard to her lower extremity ulcer. She's been tolerating the dressing changes without complication. Fortunately there is no evidence of anything infection wise occurring at this time. She barely is going to require debridement today. 08/06/18 the on evaluation today patient actually appears to be doing much better in regard to her lower Trinity ulcer. In fact this appears to be very close to complete healing. Fortunately there's no sign of infection at this time. The Hydrofera Blue Dressing to continues to stick somewhat to the wound bed unfortunately. 08/20/18 on evaluation today patient actually appears to be doing very well in regard to the left lower extremity ulcer. She has been tolerating the dressing changes without complications. She tolerates this very well. With that being said I do not see any evidence of  infection at this time. Overall I think she is making  wonderful progress is very close to complete healing. 09/03/17 on evaluation today patient appears to be doing very well in regard to her lower extremity ulcer. She show signs of good improvement which is excellent. When I last evaluated her this was definitely much larger and overall I feel like she is very close to complete healing although there is still a bit of healing left to take place for this to completely resolve. No fevers, chills, nausea, or vomiting noted at this time. Patient History Information obtained from Patient. Family History Cancer - Mother, Lung Disease - Siblings, No family history of Diabetes, Heart Disease, Hereditary Spherocytosis, Hypertension, Kidney Disease, Seizures, Stroke, Thyroid Problems, Tuberculosis. Social History Never smoker, Marital Status - Married, Alcohol Use - Never, Drug Use - No History, Caffeine Use - Daily. Medical And Surgical History Notes Respiratory pulmonary fibrosis, home O2 at night and PRN Gastrointestinal diverticulosis Review of Systems (ROS) Constitutional Symptoms (General Health) Denies complaints or symptoms of Fever, Chills. Respiratory The patient has no complaints or symptoms. Cardiovascular The patient has no complaints or symptoms. Psychiatric The patient has no complaints or symptoms. BITANIA, SHANKLAND ([id number]) Objective Constitutional Well-nourished and well-hydrated in no acute distress. Vitals Time Taken: 11:05 AM, Height: 60 in, Weight: 151 lbs, BMI: 29.5, Temperature: 98.2 F, Pulse: 68 bpm, Respiratory Rate: 18 breaths/min, Blood Pressure: 129/60 mmHg. Respiratory normal breathing without difficulty. clear to auscultation bilaterally. Cardiovascular regular rate and rhythm with normal S1, S2. Psychiatric this patient is able to make decisions and demonstrates good insight into disease process. Alert and Oriented x 3. pleasant and  cooperative. General Notes: Patient's wound currently shows good granulation there does not appear to be slough on the surface of the wound no sharp debridement was required at this point. She's very close to complete resolution. Integumentary (Hair, Skin) Wound #3 status is Open. Original cause of wound was Trauma. The wound is located on the Left,Lateral Lower Leg. The wound measures 0.4cm length x 0.5cm width x 0.1cm depth; 0.157cm^2 area and 0.016cm^3 volume. There is Fat Layer (Subcutaneous Tissue) Exposed exposed. There is no tunneling noted. There is a small amount of serous drainage noted. The wound margin is flat and intact. There is no granulation within the wound bed. There is a medium (34-66%) amount of necrotic tissue within the wound bed including Eschar. The periwound skin appearance exhibited: Hemosiderin Staining. The periwound skin appearance did not exhibit: Callus, Crepitus, Excoriation, Induration, Rash, Scarring, Dry/Scaly, Maceration, Atrophie Blanche, Cyanosis, Ecchymosis, Mottled, Pallor, Rubor, Erythema. Periwound temperature was noted as No Abnormality. The periwound has tenderness on palpation. Assessment Active Problems ICD-10 Laceration without foreign body, left lower leg, sequela Abrasion, left knee, sequela Abrasion, right knee, sequela Non-pressure chronic ulcer of other part of left lower leg with fat layer exposed Plan Wound Cleansing: Wound #3 Left,Lateral Lower Leg: Clean wound with Normal Saline. May Shower, gently pat wound dry prior to applying new dressing. - use Dial antimicrobial soap to wash wound PENELOPI, MIKRUT. ([id number]) Anesthetic (add to Medication List): Wound #3 Left,Lateral Lower Leg: Topical Lidocaine 4% cream applied to wound bed prior to debridement (In Clinic Only). Primary Wound Dressing: Wound #3 Left,Lateral Lower Leg: Hydrafera Blue Ready Transfer - Add adaptic to wound bed before hydrafera blue, to protect. Secondary  Dressing: Wound #3 Left,Lateral Lower Leg: ABD and Kerlix/Conform - ace wrap Dressing Change Frequency: Wound #3 Left,Lateral Lower Leg: Dressing is to be changed Monday and Thursday. - Home Health does Mondays. Wound Center does Thursdays Follow-up  Appointments: Return Appointment in 1 week. Edema Control: Wound #3 Left,Lateral Lower Leg: Elevate legs to the level of the heart and pump ankles as often as possible Other: - ACE wrap Additional Orders / Instructions: Wound #3 Left,Lateral Lower Leg: Vitamin A; Vitamin C, Zinc - Please add a multivitamin that has 100% of vitamin A, vitamin C and zinc supplements in it Increase protein intake. Activity as tolerated Home Health: Wound #3 Left,Lateral Lower Leg: [place] Nurse may visit PRN to address patient s wound care needs. FACE TO FACE ENCOUNTER: MEDICARE and MEDICAID PATIENTS: I certify that this patient is under my care and that I had a face-to-face encounter that meets the physician face-to-face encounter requirements with this patient on this date. The encounter with the patient was in whole or in part for the following MEDICAL CONDITION: (primary reason for [place]) MEDICAL NECESSITY: I certify, that based on my findings, NURSING services are a medically necessary home health service. HOME BOUND STATUS: I certify that my clinical findings support that this patient is homebound (i.e., Due to illness or injury, pt requires aid of supportive devices such as crutches, cane, wheelchairs, walkers, the use of special transportation or the assistance of another person to leave their place of residence. There is a normal inability to leave the home and doing so requires considerable and taxing effort. Other absences are for medical reasons / religious services and are infrequent or of short duration when for other reasons). If current dressing causes regression in wound condition, may D/C ordered  dressing product/s and apply Normal Saline Moist Dressing daily until next [place] / Other MD appointment. [place] of regression in wound condition at [phone]. Please direct any NON-WOUND related issues/requests for orders to patient's Primary Care Physician My suggestion currently is gonna be that we continue with the Current wound care measures since he seems to be doing so well. She is definitely in agreement with this plan. We will subsequently see were things stand at follow-up. Please see above for specific wound care orders. We will see patient for re-evaluation in 1 week(s) here in the clinic. If anything worsens or changes patient will contact our office for additional recommendations. Electronic Signature(s) Signed: 09/03/2018 11:59:27 PM By: [name] PA-C Entered By: [name] on 09/03/2018 23:49:04 KAMIA, INSALACO ([id number]) -------------------------------------------------------------------------------- ROS/PFSH Details Patient Name: GERYL, DOHN. Date of Service: 09/03/2018 11:30 AM Medical Record Number: [id number] Patient Account Number: [id number] Date of Birth/Sex: [birth year] (83 y.o. F) Treating RN: Montey Hora Primary Care Provider: Fenton Malling Other Clinician: Referring Provider: Fenton Malling Treating Provider/Extender: Melburn Hake, HOYT Weeks in Treatment: 85 Information Obtained From Patient Wound History Do you currently have one or more open woundso Yes How many open wounds do you currently haveo 2 Approximately how long have you had your woundso 4 days How have you been treating your wound(s) until nowo bandage from the ER Has your wound(s) ever healed and then re-openedo No Have you had any lab work done in the past montho Yes Who ordered the lab work doneo [facility] Have you tested positive for an antibiotic resistant organism (MRSA, VRE)o No Have you tested positive for osteomyelitis  (bone infection)o No Have you had any tests for circulation on your legso No Have you had other problems associated with your woundso Swelling Constitutional Symptoms (General Health) Complaints and Symptoms: Negative for: Fever; Chills Eyes Medical History: Negative for: Cataracts; Glaucoma; Optic  Neuritis Ear/Nose/Mouth/Throat Medical History: Negative for: Chronic sinus problems/congestion; Middle ear problems Hematologic/Lymphatic Medical History: Negative for: Anemia; Hemophilia; Human Immunodeficiency Virus; Lymphedema; Sickle Cell Disease Respiratory Complaints and Symptoms: No Complaints or Symptoms Medical History: Negative for: Aspiration; Asthma; Chronic Obstructive Pulmonary Disease (COPD); Pneumothorax; Sleep Apnea; Tuberculosis Past Medical History Notes: pulmonary fibrosis, home O2 at night and PRN Cardiovascular LEIGHANA, NEYMAN ([id number]) Complaints and Symptoms: No Complaints or Symptoms Medical History: Positive for: Hypertension Negative for: Angina; Arrhythmia; Congestive Heart Failure; Coronary Artery Disease; Deep Vein Thrombosis; Hypotension; Myocardial Infarction; Peripheral Arterial Disease; Peripheral Venous Disease; Phlebitis; Vasculitis Gastrointestinal Medical History: Negative for: Cirrhosis ; Colitis; Crohnos; Hepatitis A; Hepatitis B; Hepatitis C Past Medical History Notes: diverticulosis Endocrine Medical History: Negative for: Type I Diabetes; Type II Diabetes Genitourinary Medical History: Negative for: End Stage Renal Disease Immunological Medical History: Negative for: Lupus Erythematosus; Raynaudos; Scleroderma Integumentary (Skin) Medical History: Negative for: History of Burn; History of pressure wounds Musculoskeletal Medical History: Positive for: Osteoarthritis Negative for: Gout; Rheumatoid Arthritis; Osteomyelitis Neurologic Medical History: Negative for: Dementia; Neuropathy; Quadriplegia; Paraplegia; Seizure  Disorder Oncologic Medical History: Negative for: Received Chemotherapy; Received Radiation Psychiatric Complaints and Symptoms: No Complaints or Symptoms Medical History: Negative for: Anorexia/bulimia; Confinement Anxiety MARVALENE, BARRETT. ([id number]) Immunizations Pneumococcal Vaccine: Received Pneumococcal Vaccination: Yes Implantable Devices Family and Social History Cancer: Yes - Mother; Diabetes: No; Heart Disease: No; Hereditary Spherocytosis: No; Hypertension: No; Kidney Disease: No; Lung Disease: Yes - Siblings; Seizures: No; Stroke: No; Thyroid Problems: No; Tuberculosis: No; Never smoker; Marital Status - Married; Alcohol Use: Never; Drug Use: No History; Caffeine Use: Daily; Financial Concerns: No; Food, Clothing or Shelter Needs: No; Support System Lacking: No; Transportation Concerns: No; Advanced Directives: No; Patient does not want information on Advanced Directives Physician Affirmation I have reviewed and agree with the above information. Electronic Signature(s) Signed: 09/03/2018 11:59:27 PM By: [name] PA-C Signed: 09/04/2018 5:14:43 PM By: Montey Hora Entered By: [name] on 09/03/2018 23:48:38 Poppen, [name] ([id number]) -------------------------------------------------------------------------------- SuperBill Details Patient Name: GENEVRA, ORNE. Date of Service: 09/03/2018 Medical Record Number: [id number] Patient Account Number: [id number] Date of Birth/Sex: 06-07-1936 (83 y.o. F) Treating RN: Montey Hora Primary Care Provider: Fenton Malling Other Clinician: Referring Provider: Fenton Malling Treating Provider/Extender: Melburn Hake, HOYT Weeks in Treatment: 19 Diagnosis Coding ICD-10 Codes Code Description [phone] Laceration without foreign body, left lower leg, sequela S80.212S Abrasion, left knee, sequela S80.211S Abrasion, right knee, sequela L97.822 Non-pressure chronic ulcer of other part of left lower leg with  fat layer exposed Facility Procedures CPT4 Code: 90300923 Description: [facility] VISIT-LEV 3 EST PT Modifier: Quantity: 1 Physician Procedures CPT4 Code Description: [id number] - WC PHYS LEVEL 4 - EST PT ICD-10 Diagnosis Description S81.812S Laceration without foreign body, left lower leg, sequela S80.212S Abrasion, left knee, sequela S80.211S Abrasion, right knee, sequela L97.822  Non-pressure chronic ulcer of other part of left lower leg wit Modifier: h fat layer expos Quantity: 1 ed Electronic Signature(s) Signed: 09/03/2018 11:59:27 PM By: [name] PA-C Entered By: [name] on 09/03/2018 23:49:20

## 2018-09-07 DIAGNOSIS — M19072 Primary osteoarthritis, left ankle and foot: Secondary | ICD-10-CM | POA: Diagnosis not present

## 2018-09-08 DIAGNOSIS — M150 Primary generalized (osteo)arthritis: Secondary | ICD-10-CM | POA: Diagnosis not present

## 2018-09-08 DIAGNOSIS — Z9981 Dependence on supplemental oxygen: Secondary | ICD-10-CM | POA: Diagnosis not present

## 2018-09-08 DIAGNOSIS — S81812D Laceration without foreign body, left lower leg, subsequent encounter: Secondary | ICD-10-CM | POA: Diagnosis not present

## 2018-09-08 DIAGNOSIS — I10 Essential (primary) hypertension: Secondary | ICD-10-CM | POA: Diagnosis not present

## 2018-09-08 DIAGNOSIS — J84112 Idiopathic pulmonary fibrosis: Secondary | ICD-10-CM | POA: Diagnosis not present

## 2018-09-08 DIAGNOSIS — Z9181 History of falling: Secondary | ICD-10-CM | POA: Diagnosis not present

## 2018-09-10 ENCOUNTER — Encounter: Payer: Medicare Other | Admitting: Physician Assistant

## 2018-09-10 DIAGNOSIS — M1990 Unspecified osteoarthritis, unspecified site: Secondary | ICD-10-CM | POA: Diagnosis not present

## 2018-09-10 DIAGNOSIS — Z09 Encounter for follow-up examination after completed treatment for conditions other than malignant neoplasm: Secondary | ICD-10-CM | POA: Diagnosis not present

## 2018-09-10 DIAGNOSIS — I10 Essential (primary) hypertension: Secondary | ICD-10-CM | POA: Diagnosis not present

## 2018-09-10 DIAGNOSIS — L97822 Non-pressure chronic ulcer of other part of left lower leg with fat layer exposed: Secondary | ICD-10-CM | POA: Diagnosis not present

## 2018-09-12 NOTE — Progress Notes (Signed)
Support Foul O Classification: Structures Slough Wound Margin: Flat and  Intact Exudate None Present Amount: dor After Cleansing: No /Fibrino No Wound Bed Granulation Amount: None Present (0%) Exposed Structure Necrotic Amount: None Present (0%) Fascia Exposed: No Fat Layer (Subcutaneous Tissue) Exposed: Yes Tendon Exposed: No Muscle Exposed: No Joint Exposed: No Bone Exposed: No Periwound Skin Texture Texture Color [name], Terri J. ([id number]) No Abnormalities Noted: No No Abnormalities Noted: No Callus: No Atrophie Blanche: No Crepitus: No Cyanosis: No Excoriation: No Ecchymosis: No Induration: No Erythema: No Rash: No Hemosiderin Staining: Yes Scarring: No Mottled: No Pallor: No Moisture Rubor: No No Abnormalities Noted: No Dry / Scaly: No Temperature / Pain Maceration: No Temperature: No Abnormality Tenderness on Palpation: Yes Wound Preparation Ulcer Cleansing: Rinsed/Irrigated with Saline Topical Anesthetic Applied: None Electronic Signature(s) Signed: 09/10/2018 5:07:23 PM By: Montey Hora Entered By: Montey Hora on 09/10/2018 10:16:19 [name], [name] ([id number]) -------------------------------------------------------------------------------- Vitals Details Patient Name: [name]. Date of Service: 09/10/2018 10:15 AM Medical Record Number: [id number] Patient Account Number: [id number] Date of Birth/Sex: 1936/04/22 (83 y.o. F) Treating RN: Montey Hora Primary Care Corayma Cashatt: Fenton Malling Other Clinician: Referring Gerritt Galentine: Fenton Malling Treating Oreta Soloway/Extender: Melburn Hake, HOYT Weeks in Treatment: 20 Vital Signs Time Taken: 10:08 Temperature (F): 98.1 Height (in): 60 Pulse (bpm): 66 Weight (lbs): 151 Respiratory Rate (breaths/min): 18 Body Mass Index (BMI): 29.5 Blood Pressure (mmHg): 127/56 Reference Range: 80 - 120 mg / dl Electronic Signature(s) Signed: 09/11/2018 3:31:07 PM By: Lorine Bears RCP, RRT, CHT Entered By: Lorine Bears on 09/10/2018  10:10:21  [name], [name] ([id number]) Visit Report for 09/10/2018 Arrival Information Details Patient Name: [name], [name]. Date of Service: 09/10/2018 10:15 AM Medical Record Number: [id number] Patient Account Number: [id number] Date of Birth/Sex: 06-28-1936 (83 y.o. F) Treating RN: Montey Hora Primary Care Kynisha Memon: Fenton Malling Other Clinician: Referring Kendryck Lacroix: Fenton Malling Treating Alanzo Lamb/Extender: Melburn Hake, HOYT Weeks in Treatment: 20 Visit Information History Since Last Visit Added or deleted any medications: No Patient Arrived: Ambulatory Any new allergies or adverse reactions: No Arrival Time: 10:06 Had a fall or experienced change in No Accompanied By: self activities of daily living that may affect Transfer Assistance: None risk of falls: Patient Identification Verified: Yes Signs or symptoms of abuse/neglect since last visito No Secondary Verification Process Completed: Yes Hospitalized since last visit: No Implantable device outside of the clinic excluding No cellular tissue based products placed in the center since last visit: Has Dressing in Place as Prescribed: Yes Pain Present Now: No Electronic Signature(s) Signed: 09/11/2018 3:31:07 PM By: Lorine Bears RCP, RRT, CHT Entered By: Lorine Bears on 09/10/2018 10:07:49 [name] ([id number]) -------------------------------------------------------------------------------- Clinic Level of Care Assessment Details Patient Name: [name], [name]. Date of Service: 09/10/2018 10:15 AM Medical Record Number: [id number] Patient Account Number: [id number] Date of Birth/Sex: Aug 12, 1936 (83 y.o. F) Treating RN: Harold Barban Primary Care Kaidon Kinker: Fenton Malling Other Clinician: Referring Edit Ricciardelli: Fenton Malling Treating Aasia Peavler/Extender: Melburn Hake, HOYT Weeks in Treatment: 20 Clinic Level of Care Assessment Items TOOL 2 Quantity Score []  - Use when only an  EandM is performed on the INITIAL visit 0 ASSESSMENTS - Nursing Assessment / Reassessment X - General Physical Exam (combine w/ comprehensive assessment (listed just below) when 1 20 performed on new pt. evals) X- 1 25 Comprehensive Assessment (HX, ROS, Risk Assessments, Wounds Hx, etc.) ASSESSMENTS - Wound and Skin Assessment / Reassessment X - Simple Wound Assessment / Reassessment - one wound 1 5 []  - 0 Complex Wound Assessment / Reassessment - multiple wounds []  - 0 Dermatologic / Skin Assessment (not related to wound area) ASSESSMENTS - Ostomy and/or Continence Assessment and Care []  - Incontinence Assessment and Management 0 []  - 0 Ostomy Care Assessment and Management (repouching, etc.) PROCESS - Coordination of Care X - Simple Patient / Family Education for ongoing care 1 15 []  - 0 Complex (extensive) Patient / Family Education for ongoing care X- 1 10 Staff obtains Programmer, systems, Records, Test Results / Process Orders []  - 0 Staff telephones HHA, Nursing Homes / Clarify orders / etc []  - 0 Routine Transfer to another Facility (non-emergent condition) []  - 0 Routine Hospital Admission (non-emergent condition) []  - 0 New Admissions / Biomedical engineer / Ordering NPWT, Apligraf, etc. []  - 0 Emergency Hospital Admission (emergent condition) X- 1 10 Simple Discharge Coordination []  - 0 Complex (extensive) Discharge Coordination PROCESS - Special Needs []  - Pediatric / Minor Patient Management 0 []  - 0 Isolation Patient Management [name], [name]. ([id number]) []  - 0 Hearing / Language / Visual special needs []  - 0 Assessment of Community assistance (transportation, D/C planning, etc.) []  - 0 Additional assistance / Altered mentation []  - 0 Support Surface(s) Assessment (bed, cushion, seat, etc.) INTERVENTIONS - Wound Cleansing / Measurement X - Wound Imaging (photographs - any number of wounds) 1 5 []  - 0 Wound Tracing (instead of photographs) X- 1  5 Simple Wound Measurement - one wound []  - 0 Complex Wound Measurement - multiple wounds X- 1 5 Simple Wound Cleansing - one wound []  - 0  [name], [name] ([id number]) Visit Report for 09/10/2018 Arrival Information Details Patient Name: [name], [name]. Date of Service: 09/10/2018 10:15 AM Medical Record Number: [id number] Patient Account Number: [id number] Date of Birth/Sex: 06-28-1936 (83 y.o. F) Treating RN: Montey Hora Primary Care Kynisha Memon: Fenton Malling Other Clinician: Referring Kendryck Lacroix: Fenton Malling Treating Alanzo Lamb/Extender: Melburn Hake, HOYT Weeks in Treatment: 20 Visit Information History Since Last Visit Added or deleted any medications: No Patient Arrived: Ambulatory Any new allergies or adverse reactions: No Arrival Time: 10:06 Had a fall or experienced change in No Accompanied By: self activities of daily living that may affect Transfer Assistance: None risk of falls: Patient Identification Verified: Yes Signs or symptoms of abuse/neglect since last visito No Secondary Verification Process Completed: Yes Hospitalized since last visit: No Implantable device outside of the clinic excluding No cellular tissue based products placed in the center since last visit: Has Dressing in Place as Prescribed: Yes Pain Present Now: No Electronic Signature(s) Signed: 09/11/2018 3:31:07 PM By: Lorine Bears RCP, RRT, CHT Entered By: Lorine Bears on 09/10/2018 10:07:49 [name] ([id number]) -------------------------------------------------------------------------------- Clinic Level of Care Assessment Details Patient Name: [name], [name]. Date of Service: 09/10/2018 10:15 AM Medical Record Number: [id number] Patient Account Number: [id number] Date of Birth/Sex: Aug 12, 1936 (83 y.o. F) Treating RN: Harold Barban Primary Care Kaidon Kinker: Fenton Malling Other Clinician: Referring Edit Ricciardelli: Fenton Malling Treating Aasia Peavler/Extender: Melburn Hake, HOYT Weeks in Treatment: 20 Clinic Level of Care Assessment Items TOOL 2 Quantity Score []  - Use when only an  EandM is performed on the INITIAL visit 0 ASSESSMENTS - Nursing Assessment / Reassessment X - General Physical Exam (combine w/ comprehensive assessment (listed just below) when 1 20 performed on new pt. evals) X- 1 25 Comprehensive Assessment (HX, ROS, Risk Assessments, Wounds Hx, etc.) ASSESSMENTS - Wound and Skin Assessment / Reassessment X - Simple Wound Assessment / Reassessment - one wound 1 5 []  - 0 Complex Wound Assessment / Reassessment - multiple wounds []  - 0 Dermatologic / Skin Assessment (not related to wound area) ASSESSMENTS - Ostomy and/or Continence Assessment and Care []  - Incontinence Assessment and Management 0 []  - 0 Ostomy Care Assessment and Management (repouching, etc.) PROCESS - Coordination of Care X - Simple Patient / Family Education for ongoing care 1 15 []  - 0 Complex (extensive) Patient / Family Education for ongoing care X- 1 10 Staff obtains Programmer, systems, Records, Test Results / Process Orders []  - 0 Staff telephones HHA, Nursing Homes / Clarify orders / etc []  - 0 Routine Transfer to another Facility (non-emergent condition) []  - 0 Routine Hospital Admission (non-emergent condition) []  - 0 New Admissions / Biomedical engineer / Ordering NPWT, Apligraf, etc. []  - 0 Emergency Hospital Admission (emergent condition) X- 1 10 Simple Discharge Coordination []  - 0 Complex (extensive) Discharge Coordination PROCESS - Special Needs []  - Pediatric / Minor Patient Management 0 []  - 0 Isolation Patient Management [name], [name]. ([id number]) []  - 0 Hearing / Language / Visual special needs []  - 0 Assessment of Community assistance (transportation, D/C planning, etc.) []  - 0 Additional assistance / Altered mentation []  - 0 Support Surface(s) Assessment (bed, cushion, seat, etc.) INTERVENTIONS - Wound Cleansing / Measurement X - Wound Imaging (photographs - any number of wounds) 1 5 []  - 0 Wound Tracing (instead of photographs) X- 1  5 Simple Wound Measurement - one wound []  - 0 Complex Wound Measurement - multiple wounds X- 1 5 Simple Wound Cleansing - one wound []  - 0  Support Foul O Classification: Structures Slough Wound Margin: Flat and  Intact Exudate None Present Amount: dor After Cleansing: No /Fibrino No Wound Bed Granulation Amount: None Present (0%) Exposed Structure Necrotic Amount: None Present (0%) Fascia Exposed: No Fat Layer (Subcutaneous Tissue) Exposed: Yes Tendon Exposed: No Muscle Exposed: No Joint Exposed: No Bone Exposed: No Periwound Skin Texture Texture Color [name], Terri J. ([id number]) No Abnormalities Noted: No No Abnormalities Noted: No Callus: No Atrophie Blanche: No Crepitus: No Cyanosis: No Excoriation: No Ecchymosis: No Induration: No Erythema: No Rash: No Hemosiderin Staining: Yes Scarring: No Mottled: No Pallor: No Moisture Rubor: No No Abnormalities Noted: No Dry / Scaly: No Temperature / Pain Maceration: No Temperature: No Abnormality Tenderness on Palpation: Yes Wound Preparation Ulcer Cleansing: Rinsed/Irrigated with Saline Topical Anesthetic Applied: None Electronic Signature(s) Signed: 09/10/2018 5:07:23 PM By: Montey Hora Entered By: Montey Hora on 09/10/2018 10:16:19 [name], [name] ([id number]) -------------------------------------------------------------------------------- Vitals Details Patient Name: [name]. Date of Service: 09/10/2018 10:15 AM Medical Record Number: [id number] Patient Account Number: [id number] Date of Birth/Sex: 1936/04/22 (83 y.o. F) Treating RN: Montey Hora Primary Care Corayma Cashatt: Fenton Malling Other Clinician: Referring Gerritt Galentine: Fenton Malling Treating Oreta Soloway/Extender: Melburn Hake, HOYT Weeks in Treatment: 20 Vital Signs Time Taken: 10:08 Temperature (F): 98.1 Height (in): 60 Pulse (bpm): 66 Weight (lbs): 151 Respiratory Rate (breaths/min): 18 Body Mass Index (BMI): 29.5 Blood Pressure (mmHg): 127/56 Reference Range: 80 - 120 mg / dl Electronic Signature(s) Signed: 09/11/2018 3:31:07 PM By: Lorine Bears RCP, RRT, CHT Entered By: Lorine Bears on 09/10/2018  10:10:21

## 2018-09-12 NOTE — Progress Notes (Signed)
[name], [name] ([id number]) Visit Report for 09/10/2018 Chief Complaint Document Details Patient Name: [name], [name]. Date of Service: 09/10/2018 10:15 AM Medical Record Number: [id number] Patient Account Number: [id number] Date of Birth/Sex: 1935/10/11 (83 y.o. F) Treating RN: Montey Hora Primary Care Provider: Fenton Malling Other Clinician: Referring Provider: Fenton Malling Treating Provider/Extender: Melburn Hake, Leanora Murin Weeks in Treatment: 20 Information Obtained from: Patient Chief Complaint LLE Ulcers Electronic Signature(s) Signed: 09/10/2018 3:05:10 PM By: [name] PA-C Entered By: [name] on 09/10/2018 10:05:36 [name], [name] ([id number]) -------------------------------------------------------------------------------- HPI Details Patient Name: [name], [name] Date of Service: 09/10/2018 10:15 AM Medical Record Number: [id number] Patient Account Number: [id number] Date of Birth/Sex: [birth year] (83 y.o. F) Treating RN: Montey Hora Primary Care Provider: Fenton Malling Other Clinician: Referring Provider: Fenton Malling Treating Provider/Extender: Melburn Hake, Onnika Siebel Weeks in Treatment: 20 History of Present Illness HPI Description: 04/23/18-She is seen in initial evaluation for multiple skin tears s/p fall on 8/19. She did sustain a closed fracture of the right patella secondary to that fall. She presented to emergency department on 8/19 for treatment. The left leg laceration was unable to be sutured closed, Steri-Strips were applied and she was treated with knee immobilizer for the patella fracture. She continues on Keflex that was initiated on 8/19. The left leg skin tear flap was cleansed and reapproximated, secure with steri strips. She will follow up next week 04/30/18-She is seen in follow-up evaluation for multiple skin tears. She continues with knee immobilizers to the right lower extremity, has developed lower extremity edema with  subsequent weeping; will initiate ace wrap compression. The flap is mostly devitalized, which was somewhat expected; we will change treatment plan and she will follow up next week 05/07/18-She is seen in follow-up evaluation for multiple wounds. The left lateral lower leg skin flap is nonviable/devitalized, now eschar covered. She tolerated debridement fairly well. The right lower extremity skin tear that was new last week has essentially healed. She has responded nicely to Ace wrap compression. We will modify treatment plan and she will follow-up next week 05/14/18 on evaluation today patient actually appears to be doing a little better in regard to her left lower extremity ulcers. With that being said she has been tolerating the dressing changes without complication. Medihoney is what she was switch to dear in the last week's visit. Prior to that we used Iodoflex when she had the eschar present. Nonetheless I believe that the Iodoflex may be the best thing for her at this point in regard to the current wound bed and prevention of biofilm buildup. Nonetheless she seems to be tolerating the dressing changes without complication which is good news. It's really the debridement the calls are the most pain last week she did allow for debridement this week she also agreed to allow me to attempt debridement in order to try to help this wound to heal more quickly. 05/21/18 on evaluation today patient actually appears to be doing much better at this time in regard to her left lateral lower Trinity ulcer. She's been tolerating the dressing changes without complication. Fortunately there does not appear to be any evidence of infection at this time. There is still some Slough noted on the surface of the wound although not as much as previous. She continues to have a lot of discomfort currently. She tells me that once we put the Iodoflex on the last time it actually burned from Thursday till Sunday night and then  eased off. Nonetheless it does  knee,  sequela Abrasion, right knee, sequela Non-pressure chronic ulcer of other part of left lower leg with fat layer exposed Macconnell, Yolani J. ([id number]) Plan Wound Cleansing: Wound #3 Left,Lateral Lower Leg: Clean wound with Normal Saline. May Shower, gently pat wound dry prior to applying new dressing. - use Dial antimicrobial soap to wash wound, apply lotion to entire leg, then apply sleeve. Follow-up Appointments: Return Appointment in 1 week. Edema Control: Wound #3 Left,Lateral Lower Leg: Elevate legs to the level of the heart and pump ankles as often as possible Additional Orders / Instructions: Wound #3 Left,Lateral Lower Leg: Vitamin A; Vitamin C, Zinc - Please add a multivitamin that has 100% of vitamin A, vitamin C and zinc supplements in it Increase protein intake. Activity as tolerated Home Health: Wound #3 Left,Lateral Lower Leg: [facility] Nurse may visit PRN to address patient s wound care needs. - One more visit. Please go followup with patient on Monday, January 13. FACE TO FACE ENCOUNTER: MEDICARE and MEDICAID PATIENTS: I certify that this patient is under my care and that I had a face-to-face encounter that meets the physician face-to-face encounter requirements with this patient on this date. The encounter with the patient was in whole or in part for the following MEDICAL CONDITION: (primary reason for [place]) MEDICAL NECESSITY: I certify, that based on my findings, NURSING services are a medically necessary home health service. HOME BOUND STATUS: I certify that my clinical findings support that this patient is homebound (i.e., Due to illness or injury, pt requires aid of supportive devices such as crutches, cane, wheelchairs, walkers, the use of special transportation or the assistance of another person to leave their place of residence. There is a normal inability to leave the home and doing so requires considerable and taxing  effort. Other absences are for medical reasons / religious services and are infrequent or of short duration when for other reasons). If current dressing causes regression in wound condition, may D/C ordered dressing product/s and apply Normal Saline Moist Dressing daily until next [place] / Other MD appointment. [place] of regression in wound condition at [phone]. Please direct any NON-WOUND related issues/requests for orders to patient's Primary Care Physician Patient seems to be doing very well and I think she's likely heal and that we can get this one the week before making the decision to discharge. I just want to ensure that nothing attempts to reopen prior to that time. She's in agreement with this plan. Otherwise will see her back for reevaluation at that point. Please see above for specific wound care orders. We will see patient for re-evaluation in 1 week(s) here in the clinic. If anything worsens or changes patient will contact our office for additional recommendations. Electronic Signature(s) Signed: 09/10/2018 3:05:10 PM By: [name] PA-C Entered By: [name] on 09/10/2018 10:39:10 [name], [name] ([id number]) -------------------------------------------------------------------------------- ROS/PFSH Details Patient Name: [name], [name]. Date of Service: 09/10/2018 10:15 AM Medical Record Number: [id number] Patient Account Number: [id number] Date of Birth/Sex: 06/05/1936 (83 y.o. F) Treating RN: Montey Hora Primary Care Provider: Fenton Malling Other Clinician: Referring Provider: Fenton Malling Treating Provider/Extender: Melburn Hake, Tavi Hoogendoorn Weeks in Treatment: 20 Information Obtained From Patient Wound History Do you currently have one or more open woundso Yes How many open wounds do you currently haveo 2 Approximately how long have you had your woundso 4 days How have you been treating your wound(s) until nowo  bandage  knee,  sequela Abrasion, right knee, sequela Non-pressure chronic ulcer of other part of left lower leg with fat layer exposed Macconnell, Yolani J. ([id number]) Plan Wound Cleansing: Wound #3 Left,Lateral Lower Leg: Clean wound with Normal Saline. May Shower, gently pat wound dry prior to applying new dressing. - use Dial antimicrobial soap to wash wound, apply lotion to entire leg, then apply sleeve. Follow-up Appointments: Return Appointment in 1 week. Edema Control: Wound #3 Left,Lateral Lower Leg: Elevate legs to the level of the heart and pump ankles as often as possible Additional Orders / Instructions: Wound #3 Left,Lateral Lower Leg: Vitamin A; Vitamin C, Zinc - Please add a multivitamin that has 100% of vitamin A, vitamin C and zinc supplements in it Increase protein intake. Activity as tolerated Home Health: Wound #3 Left,Lateral Lower Leg: [facility] Nurse may visit PRN to address patient s wound care needs. - One more visit. Please go followup with patient on Monday, January 13. FACE TO FACE ENCOUNTER: MEDICARE and MEDICAID PATIENTS: I certify that this patient is under my care and that I had a face-to-face encounter that meets the physician face-to-face encounter requirements with this patient on this date. The encounter with the patient was in whole or in part for the following MEDICAL CONDITION: (primary reason for [place]) MEDICAL NECESSITY: I certify, that based on my findings, NURSING services are a medically necessary home health service. HOME BOUND STATUS: I certify that my clinical findings support that this patient is homebound (i.e., Due to illness or injury, pt requires aid of supportive devices such as crutches, cane, wheelchairs, walkers, the use of special transportation or the assistance of another person to leave their place of residence. There is a normal inability to leave the home and doing so requires considerable and taxing  effort. Other absences are for medical reasons / religious services and are infrequent or of short duration when for other reasons). If current dressing causes regression in wound condition, may D/C ordered dressing product/s and apply Normal Saline Moist Dressing daily until next [place] / Other MD appointment. [place] of regression in wound condition at [phone]. Please direct any NON-WOUND related issues/requests for orders to patient's Primary Care Physician Patient seems to be doing very well and I think she's likely heal and that we can get this one the week before making the decision to discharge. I just want to ensure that nothing attempts to reopen prior to that time. She's in agreement with this plan. Otherwise will see her back for reevaluation at that point. Please see above for specific wound care orders. We will see patient for re-evaluation in 1 week(s) here in the clinic. If anything worsens or changes patient will contact our office for additional recommendations. Electronic Signature(s) Signed: 09/10/2018 3:05:10 PM By: [name] PA-C Entered By: [name] on 09/10/2018 10:39:10 [name], [name] ([id number]) -------------------------------------------------------------------------------- ROS/PFSH Details Patient Name: [name], [name]. Date of Service: 09/10/2018 10:15 AM Medical Record Number: [id number] Patient Account Number: [id number] Date of Birth/Sex: 06/05/1936 (83 y.o. F) Treating RN: Montey Hora Primary Care Provider: Fenton Malling Other Clinician: Referring Provider: Fenton Malling Treating Provider/Extender: Melburn Hake, Tavi Hoogendoorn Weeks in Treatment: 20 Information Obtained From Patient Wound History Do you currently have one or more open woundso Yes How many open wounds do you currently haveo 2 Approximately how long have you had your woundso 4 days How have you been treating your wound(s) until nowo  bandage  [name], [name] ([id number]) Visit Report for 09/10/2018 Chief Complaint Document Details Patient Name: [name], [name]. Date of Service: 09/10/2018 10:15 AM Medical Record Number: [id number] Patient Account Number: [id number] Date of Birth/Sex: 1935/10/11 (83 y.o. F) Treating RN: Montey Hora Primary Care Provider: Fenton Malling Other Clinician: Referring Provider: Fenton Malling Treating Provider/Extender: Melburn Hake, Leanora Murin Weeks in Treatment: 20 Information Obtained from: Patient Chief Complaint LLE Ulcers Electronic Signature(s) Signed: 09/10/2018 3:05:10 PM By: [name] PA-C Entered By: [name] on 09/10/2018 10:05:36 [name], [name] ([id number]) -------------------------------------------------------------------------------- HPI Details Patient Name: [name], [name] Date of Service: 09/10/2018 10:15 AM Medical Record Number: [id number] Patient Account Number: [id number] Date of Birth/Sex: [birth year] (83 y.o. F) Treating RN: Montey Hora Primary Care Provider: Fenton Malling Other Clinician: Referring Provider: Fenton Malling Treating Provider/Extender: Melburn Hake, Onnika Siebel Weeks in Treatment: 20 History of Present Illness HPI Description: 04/23/18-She is seen in initial evaluation for multiple skin tears s/p fall on 8/19. She did sustain a closed fracture of the right patella secondary to that fall. She presented to emergency department on 8/19 for treatment. The left leg laceration was unable to be sutured closed, Steri-Strips were applied and she was treated with knee immobilizer for the patella fracture. She continues on Keflex that was initiated on 8/19. The left leg skin tear flap was cleansed and reapproximated, secure with steri strips. She will follow up next week 04/30/18-She is seen in follow-up evaluation for multiple skin tears. She continues with knee immobilizers to the right lower extremity, has developed lower extremity edema with  subsequent weeping; will initiate ace wrap compression. The flap is mostly devitalized, which was somewhat expected; we will change treatment plan and she will follow up next week 05/07/18-She is seen in follow-up evaluation for multiple wounds. The left lateral lower leg skin flap is nonviable/devitalized, now eschar covered. She tolerated debridement fairly well. The right lower extremity skin tear that was new last week has essentially healed. She has responded nicely to Ace wrap compression. We will modify treatment plan and she will follow-up next week 05/14/18 on evaluation today patient actually appears to be doing a little better in regard to her left lower extremity ulcers. With that being said she has been tolerating the dressing changes without complication. Medihoney is what she was switch to dear in the last week's visit. Prior to that we used Iodoflex when she had the eschar present. Nonetheless I believe that the Iodoflex may be the best thing for her at this point in regard to the current wound bed and prevention of biofilm buildup. Nonetheless she seems to be tolerating the dressing changes without complication which is good news. It's really the debridement the calls are the most pain last week she did allow for debridement this week she also agreed to allow me to attempt debridement in order to try to help this wound to heal more quickly. 05/21/18 on evaluation today patient actually appears to be doing much better at this time in regard to her left lateral lower Trinity ulcer. She's been tolerating the dressing changes without complication. Fortunately there does not appear to be any evidence of infection at this time. There is still some Slough noted on the surface of the wound although not as much as previous. She continues to have a lot of discomfort currently. She tells me that once we put the Iodoflex on the last time it actually burned from Thursday till Sunday night and then  eased off. Nonetheless it does  [name], [name] ([id number]) Visit Report for 09/10/2018 Chief Complaint Document Details Patient Name: [name], [name]. Date of Service: 09/10/2018 10:15 AM Medical Record Number: [id number] Patient Account Number: [id number] Date of Birth/Sex: 1935/10/11 (83 y.o. F) Treating RN: Montey Hora Primary Care Provider: Fenton Malling Other Clinician: Referring Provider: Fenton Malling Treating Provider/Extender: Melburn Hake, Leanora Murin Weeks in Treatment: 20 Information Obtained from: Patient Chief Complaint LLE Ulcers Electronic Signature(s) Signed: 09/10/2018 3:05:10 PM By: [name] PA-C Entered By: [name] on 09/10/2018 10:05:36 [name], [name] ([id number]) -------------------------------------------------------------------------------- HPI Details Patient Name: [name], [name] Date of Service: 09/10/2018 10:15 AM Medical Record Number: [id number] Patient Account Number: [id number] Date of Birth/Sex: [birth year] (83 y.o. F) Treating RN: Montey Hora Primary Care Provider: Fenton Malling Other Clinician: Referring Provider: Fenton Malling Treating Provider/Extender: Melburn Hake, Onnika Siebel Weeks in Treatment: 20 History of Present Illness HPI Description: 04/23/18-She is seen in initial evaluation for multiple skin tears s/p fall on 8/19. She did sustain a closed fracture of the right patella secondary to that fall. She presented to emergency department on 8/19 for treatment. The left leg laceration was unable to be sutured closed, Steri-Strips were applied and she was treated with knee immobilizer for the patella fracture. She continues on Keflex that was initiated on 8/19. The left leg skin tear flap was cleansed and reapproximated, secure with steri strips. She will follow up next week 04/30/18-She is seen in follow-up evaluation for multiple skin tears. She continues with knee immobilizers to the right lower extremity, has developed lower extremity edema with  subsequent weeping; will initiate ace wrap compression. The flap is mostly devitalized, which was somewhat expected; we will change treatment plan and she will follow up next week 05/07/18-She is seen in follow-up evaluation for multiple wounds. The left lateral lower leg skin flap is nonviable/devitalized, now eschar covered. She tolerated debridement fairly well. The right lower extremity skin tear that was new last week has essentially healed. She has responded nicely to Ace wrap compression. We will modify treatment plan and she will follow-up next week 05/14/18 on evaluation today patient actually appears to be doing a little better in regard to her left lower extremity ulcers. With that being said she has been tolerating the dressing changes without complication. Medihoney is what she was switch to dear in the last week's visit. Prior to that we used Iodoflex when she had the eschar present. Nonetheless I believe that the Iodoflex may be the best thing for her at this point in regard to the current wound bed and prevention of biofilm buildup. Nonetheless she seems to be tolerating the dressing changes without complication which is good news. It's really the debridement the calls are the most pain last week she did allow for debridement this week she also agreed to allow me to attempt debridement in order to try to help this wound to heal more quickly. 05/21/18 on evaluation today patient actually appears to be doing much better at this time in regard to her left lateral lower Trinity ulcer. She's been tolerating the dressing changes without complication. Fortunately there does not appear to be any evidence of infection at this time. There is still some Slough noted on the surface of the wound although not as much as previous. She continues to have a lot of discomfort currently. She tells me that once we put the Iodoflex on the last time it actually burned from Thursday till Sunday night and then  eased off. Nonetheless it does  [name], [name] ([id number]) Visit Report for 09/10/2018 Chief Complaint Document Details Patient Name: [name], [name]. Date of Service: 09/10/2018 10:15 AM Medical Record Number: [id number] Patient Account Number: [id number] Date of Birth/Sex: 1935/10/11 (83 y.o. F) Treating RN: Montey Hora Primary Care Provider: Fenton Malling Other Clinician: Referring Provider: Fenton Malling Treating Provider/Extender: Melburn Hake, Leanora Murin Weeks in Treatment: 20 Information Obtained from: Patient Chief Complaint LLE Ulcers Electronic Signature(s) Signed: 09/10/2018 3:05:10 PM By: [name] PA-C Entered By: [name] on 09/10/2018 10:05:36 [name], [name] ([id number]) -------------------------------------------------------------------------------- HPI Details Patient Name: [name], [name] Date of Service: 09/10/2018 10:15 AM Medical Record Number: [id number] Patient Account Number: [id number] Date of Birth/Sex: [birth year] (83 y.o. F) Treating RN: Montey Hora Primary Care Provider: Fenton Malling Other Clinician: Referring Provider: Fenton Malling Treating Provider/Extender: Melburn Hake, Onnika Siebel Weeks in Treatment: 20 History of Present Illness HPI Description: 04/23/18-She is seen in initial evaluation for multiple skin tears s/p fall on 8/19. She did sustain a closed fracture of the right patella secondary to that fall. She presented to emergency department on 8/19 for treatment. The left leg laceration was unable to be sutured closed, Steri-Strips were applied and she was treated with knee immobilizer for the patella fracture. She continues on Keflex that was initiated on 8/19. The left leg skin tear flap was cleansed and reapproximated, secure with steri strips. She will follow up next week 04/30/18-She is seen in follow-up evaluation for multiple skin tears. She continues with knee immobilizers to the right lower extremity, has developed lower extremity edema with  subsequent weeping; will initiate ace wrap compression. The flap is mostly devitalized, which was somewhat expected; we will change treatment plan and she will follow up next week 05/07/18-She is seen in follow-up evaluation for multiple wounds. The left lateral lower leg skin flap is nonviable/devitalized, now eschar covered. She tolerated debridement fairly well. The right lower extremity skin tear that was new last week has essentially healed. She has responded nicely to Ace wrap compression. We will modify treatment plan and she will follow-up next week 05/14/18 on evaluation today patient actually appears to be doing a little better in regard to her left lower extremity ulcers. With that being said she has been tolerating the dressing changes without complication. Medihoney is what she was switch to dear in the last week's visit. Prior to that we used Iodoflex when she had the eschar present. Nonetheless I believe that the Iodoflex may be the best thing for her at this point in regard to the current wound bed and prevention of biofilm buildup. Nonetheless she seems to be tolerating the dressing changes without complication which is good news. It's really the debridement the calls are the most pain last week she did allow for debridement this week she also agreed to allow me to attempt debridement in order to try to help this wound to heal more quickly. 05/21/18 on evaluation today patient actually appears to be doing much better at this time in regard to her left lateral lower Trinity ulcer. She's been tolerating the dressing changes without complication. Fortunately there does not appear to be any evidence of infection at this time. There is still some Slough noted on the surface of the wound although not as much as previous. She continues to have a lot of discomfort currently. She tells me that once we put the Iodoflex on the last time it actually burned from Thursday till Sunday night and then  eased off. Nonetheless it does  to leave their place of residence. There is a normal  inability to leave the home and doing so requires considerable and taxing effort. Other absences are for medical reasons / religious services and are infrequent or of short duration when for other reasons). [name], [name] ([id number]) o If current dressing causes regression in wound condition, may D/C ordered dressing product/s and apply Normal Saline Moist Dressing daily until next [place] / Other MD appointment. [place] of regression in wound condition at [phone]. o Please direct any NON-WOUND related issues/requests for orders to patient's Primary Care Physician Electronic Signature(s) Signed: 09/10/2018 2:41:13 PM By: Harold Barban Signed: 09/10/2018 3:05:10 PM By: [name] PA-C Entered By: Harold Barban on 09/10/2018 10:27:04 [name], [name] ([id number]) -------------------------------------------------------------------------------- Problem List Details Patient Name: [name], [name]. Date of Service: 09/10/2018 10:15 AM Medical Record Number: [id number] Patient Account Number: [id number] Date of Birth/Sex: 08-09-1936 (83 y.o. 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 Date of Service: 09/10/2018 10:15 AM Medical Record Number: [id number] Patient Account Number: [id number] Date of Birth/Sex: 09-Dec-1935 (83 y.o. F) Treating RN: Montey Hora Primary Care Provider: Fenton Malling Other Clinician: Referring Provider: Fenton Malling Treating Provider/Extender: Melburn Hake, Julien Berryman Weeks in Treatment: 20 Subjective Chief Complaint Information obtained from Patient LLE Ulcers History of Present Illness (HPI) 04/23/18-She is seen in initial evaluation for multiple skin tears s/p fall on 8/19. She did sustain a closed fracture of the right patella secondary to that fall. She presented to emergency department on 8/19 for treatment. The left leg laceration was unable to be sutured closed, Steri-Strips were applied and she was treated with knee immobilizer for the patella fracture. She continues on Keflex that was initiated on 8/19. The left leg skin tear flap was cleansed and reapproximated, secure with steri strips. She will follow up next week 04/30/18-She is seen in follow-up evaluation for multiple skin tears. She continues with knee immobilizers to the right lower extremity, has developed lower extremity edema with subsequent weeping; will initiate ace wrap compression. The flap is mostly devitalized, which was somewhat expected; we will change treatment plan and she will follow up next week 05/07/18-She is seen in follow-up evaluation for multiple wounds. The left lateral lower leg skin flap is nonviable/devitalized, now eschar covered. She tolerated debridement fairly well. The right lower extremity skin tear that was new last week has essentially healed. She has responded nicely to Ace wrap compression. We will modify treatment plan and she will follow-up next week 05/14/18 on evaluation today patient actually  appears to be doing a little better in regard to her left lower extremity ulcers. With that being said she has been tolerating the dressing changes without complication. Medihoney is what she was switch to dear in the last week's visit. Prior to that we used Iodoflex when she had the  [name], [name] ([id number]) Visit Report for 09/10/2018 Chief Complaint Document Details Patient Name: [name], [name]. Date of Service: 09/10/2018 10:15 AM Medical Record Number: [id number] Patient Account Number: [id number] Date of Birth/Sex: 1935/10/11 (83 y.o. F) Treating RN: Montey Hora Primary Care Provider: Fenton Malling Other Clinician: Referring Provider: Fenton Malling Treating Provider/Extender: Melburn Hake, Leanora Murin Weeks in Treatment: 20 Information Obtained from: Patient Chief Complaint LLE Ulcers Electronic Signature(s) Signed: 09/10/2018 3:05:10 PM By: [name] PA-C Entered By: [name] on 09/10/2018 10:05:36 [name], [name] ([id number]) -------------------------------------------------------------------------------- HPI Details Patient Name: [name], [name] Date of Service: 09/10/2018 10:15 AM Medical Record Number: [id number] Patient Account Number: [id number] Date of Birth/Sex: [birth year] (83 y.o. F) Treating RN: Montey Hora Primary Care Provider: Fenton Malling Other Clinician: Referring Provider: Fenton Malling Treating Provider/Extender: Melburn Hake, Onnika Siebel Weeks in Treatment: 20 History of Present Illness HPI Description: 04/23/18-She is seen in initial evaluation for multiple skin tears s/p fall on 8/19. She did sustain a closed fracture of the right patella secondary to that fall. She presented to emergency department on 8/19 for treatment. The left leg laceration was unable to be sutured closed, Steri-Strips were applied and she was treated with knee immobilizer for the patella fracture. She continues on Keflex that was initiated on 8/19. The left leg skin tear flap was cleansed and reapproximated, secure with steri strips. She will follow up next week 04/30/18-She is seen in follow-up evaluation for multiple skin tears. She continues with knee immobilizers to the right lower extremity, has developed lower extremity edema with  subsequent weeping; will initiate ace wrap compression. The flap is mostly devitalized, which was somewhat expected; we will change treatment plan and she will follow up next week 05/07/18-She is seen in follow-up evaluation for multiple wounds. The left lateral lower leg skin flap is nonviable/devitalized, now eschar covered. She tolerated debridement fairly well. The right lower extremity skin tear that was new last week has essentially healed. She has responded nicely to Ace wrap compression. We will modify treatment plan and she will follow-up next week 05/14/18 on evaluation today patient actually appears to be doing a little better in regard to her left lower extremity ulcers. With that being said she has been tolerating the dressing changes without complication. Medihoney is what she was switch to dear in the last week's visit. Prior to that we used Iodoflex when she had the eschar present. Nonetheless I believe that the Iodoflex may be the best thing for her at this point in regard to the current wound bed and prevention of biofilm buildup. Nonetheless she seems to be tolerating the dressing changes without complication which is good news. It's really the debridement the calls are the most pain last week she did allow for debridement this week she also agreed to allow me to attempt debridement in order to try to help this wound to heal more quickly. 05/21/18 on evaluation today patient actually appears to be doing much better at this time in regard to her left lateral lower [place] ulcer. She's been tolerating the dressing changes without complication. Fortunately there does not appear to be any evidence of infection at this time. There is still some [place] noted on the surface of the wound although not as much as previous. She continues to have a lot of discomfort currently. She tells me that once we put the Iodoflex on the last time it actually burned from Thursday till Sunday night and then  eased off. Nonetheless it does  knee,  sequela Abrasion, right knee, sequela Non-pressure chronic ulcer of other part of left lower leg with fat layer exposed Macconnell, Yolani J. ([id number]) Plan Wound Cleansing: Wound #3 Left,Lateral Lower Leg: Clean wound with Normal Saline. May Shower, gently pat wound dry prior to applying new dressing. - use Dial antimicrobial soap to wash wound, apply lotion to entire leg, then apply sleeve. Follow-up Appointments: Return Appointment in 1 week. Edema Control: Wound #3 Left,Lateral Lower Leg: Elevate legs to the level of the heart and pump ankles as often as possible Additional Orders / Instructions: Wound #3 Left,Lateral Lower Leg: Vitamin A; Vitamin C, Zinc - Please add a multivitamin that has 100% of vitamin A, vitamin C and zinc supplements in it Increase protein intake. Activity as tolerated Home Health: Wound #3 Left,Lateral Lower Leg: [place] Nurse may visit PRN to address patient s wound care needs. - One more visit. Please go followup with patient on Monday, January 13. FACE TO FACE ENCOUNTER: MEDICARE and MEDICAID PATIENTS: I certify that this patient is under my care and that I had a face-to-face encounter that meets the physician face-to-face encounter requirements with this patient on this date. The encounter with the patient was in whole or in part for the following MEDICAL CONDITION: (primary reason for [place]) MEDICAL NECESSITY: I certify, that based on my findings, NURSING services are a medically necessary home health service. HOME BOUND STATUS: I certify that my clinical findings support that this patient is homebound (i.e., Due to illness or injury, pt requires aid of supportive devices such as crutches, cane, wheelchairs, walkers, the use of special transportation or the assistance of another person to leave their place of residence. There is a normal inability to leave the home and doing so requires considerable and taxing  effort. Other absences are for medical reasons / religious services and are infrequent or of short duration when for other reasons). If current dressing causes regression in wound condition, may D/C ordered dressing product/s and apply Normal Saline Moist Dressing daily until next [place] / Other MD appointment. [place] of regression in wound condition at [phone]. Please direct any NON-WOUND related issues/requests for orders to patient's Primary Care Physician Patient seems to be doing very well and I think she's likely heal and that we can get this one the week before making the decision to discharge. I just want to ensure that nothing attempts to reopen prior to that time. She's in agreement with this plan. Otherwise will see her back for reevaluation at that point. Please see above for specific wound care orders. We will see patient for re-evaluation in 1 week(s) here in the clinic. If anything worsens or changes patient will contact our office for additional recommendations. Electronic Signature(s) Signed: 09/10/2018 3:05:10 PM By: [name] PA-C Entered By: [name] on 09/10/2018 10:39:10 [name], [name] ([id number]) -------------------------------------------------------------------------------- ROS/PFSH Details Patient Name: [name], [name]. Date of Service: 09/10/2018 10:15 AM Medical Record Number: [id number] Patient Account Number: [id number] Date of Birth/Sex: 06/05/1936 (83 y.o. F) Treating RN: Montey Hora Primary Care Provider: Fenton Malling Other Clinician: Referring Provider: Fenton Malling Treating Provider/Extender: Melburn Hake, Tavi Hoogendoorn Weeks in Treatment: 20 Information Obtained From Patient Wound History Do you currently have one or more open woundso Yes How many open wounds do you currently haveo 2 Approximately how long have you had your woundso 4 days How have you been treating your wound(s) until nowo  bandage

## 2018-09-14 DIAGNOSIS — Z9981 Dependence on supplemental oxygen: Secondary | ICD-10-CM | POA: Diagnosis not present

## 2018-09-14 DIAGNOSIS — M150 Primary generalized (osteo)arthritis: Secondary | ICD-10-CM | POA: Diagnosis not present

## 2018-09-14 DIAGNOSIS — S81812D Laceration without foreign body, left lower leg, subsequent encounter: Secondary | ICD-10-CM | POA: Diagnosis not present

## 2018-09-14 DIAGNOSIS — Z9181 History of falling: Secondary | ICD-10-CM | POA: Diagnosis not present

## 2018-09-14 DIAGNOSIS — I10 Essential (primary) hypertension: Secondary | ICD-10-CM | POA: Diagnosis not present

## 2018-09-14 DIAGNOSIS — J84112 Idiopathic pulmonary fibrosis: Secondary | ICD-10-CM | POA: Diagnosis not present

## 2018-09-16 ENCOUNTER — Ambulatory Visit
Admission: RE | Admit: 2018-09-16 | Discharge: 2018-09-16 | Disposition: A | Discharge status: Home / Self Care | Payer: Medicare Other | Source: Ambulatory Visit | Attending: Physician Assistant | Admitting: Physician Assistant

## 2018-09-16 ENCOUNTER — Encounter: Payer: Self-pay | Admitting: Physician Assistant

## 2018-09-16 ENCOUNTER — Ambulatory Visit
Admission: RE | Admit: 2018-09-16 | Discharge: 2018-09-16 | Disposition: A | Discharge status: Home / Self Care | Payer: Medicare Other | Attending: Physician Assistant | Admitting: Physician Assistant

## 2018-09-16 ENCOUNTER — Ambulatory Visit (INDEPENDENT_AMBULATORY_CARE_PROVIDER_SITE_OTHER): Payer: Medicare Other | Admitting: Physician Assistant

## 2018-09-16 ENCOUNTER — Encounter: Payer: Medicare Other | Admitting: Obstetrics and Gynecology

## 2018-09-16 VITALS — BP 130/80 | HR 79 | Temp 98.6°F | Resp 16 | Wt 144.6 lb

## 2018-09-16 DIAGNOSIS — R634 Abnormal weight loss: Secondary | ICD-10-CM | POA: Insufficient documentation

## 2018-09-16 DIAGNOSIS — R197 Diarrhea, unspecified: Secondary | ICD-10-CM | POA: Insufficient documentation

## 2018-09-16 DIAGNOSIS — R5383 Other fatigue: Secondary | ICD-10-CM

## 2018-09-16 DIAGNOSIS — E559 Vitamin D deficiency, unspecified: Secondary | ICD-10-CM | POA: Diagnosis not present

## 2018-09-16 DIAGNOSIS — R110 Nausea: Secondary | ICD-10-CM

## 2018-09-16 NOTE — Progress Notes (Signed)
Patient: [name] Female    DOB: December 17, 1935   83 y.o.   MRN: [id number] Visit Date: 09/16/2018  Today's Provider: Mar Daring, PA-C   Chief Complaint  Patient presents with  . Cough   Subjective:     Cough  This is a recurrent problem. The current episode started 1 to 4 weeks ago. The problem has been gradually improving. Associated symptoms include chills and shortness of breath. Pertinent negatives include no chest pain, ear congestion, ear pain, fever, headaches, heartburn, hemoptysis, myalgias, nasal congestion, postnasal drip, rash, rhinorrhea, sore throat, sweats, weight loss or wheezing. She has tried OTC cough suppressant for the symptoms. The treatment provided significant relief.   Patient is here today with more complaints of increasing fatigue, worsening diarrhea, nausea with no appetite and a 9 pound weight loss in 2-4 weeks. She reports she has no appetite for any foods. Only thing she feels she can eat is peanut butter crackers. She does have chronic diarrhea that she reports has worsened. Has occasionally awakened her in the middle of the night for a BM. Reports having BMs in the morning mostly, and can have 2-3 watery BM during those morning times. Also reports diarrhea with eating meals. Denies hematochezia or melena.    No Known Allergies   Current Outpatient Medications:  .  albuterol (PROVENTIL HFA;VENTOLIN HFA) 108 (90 Base) MCG/ACT inhaler, Inhale into the lungs., Disp: , Rfl:  .  citalopram (CELEXA) 20 MG tablet, , Disp: , Rfl:  .  Cyanocobalamin (B-12) 2500 MCG TABS, Take 2,500 mcg by mouth daily., Disp: , Rfl:  .  ESBRIET 267 MG TABS, Take 6 tablets by mouth daily. , Disp: , Rfl:  .  estradiol (ESTRACE VAGINAL) 0.1 MG/GM vaginal cream, Place 1 Applicatorful vaginally once a week. (1gram weekly), Disp: 42.5 g, Rfl: 2 .  HYDROmorphone (DILAUDID) 2 MG tablet, Take 2 mg by mouth 2 (two) times daily. , Disp: , Rfl:  .  nystatin ointment  (MYCOSTATIN), Apply 1 application topically 2 (two) times daily., Disp: 30 g, Rfl: 0 .  OXYGEN, Inhale 2 L into the lungs as needed., Disp: , Rfl:  .  OXYQUINOLONE SULFATE VAGINAL (TRIMO-SAN) 0.025 % GEL, Place 1 Applicatorful vaginally every 14 (fourteen) days., Disp: 45 g, Rfl: 1 .  VOLTAREN 1 % GEL, , Disp: , Rfl: 2 .  zoledronic acid (RECLAST) 5 MG/100ML SOLN injection, Inject into the vein., Disp: , Rfl:   Review of Systems  Constitutional: Positive for activity change, appetite change, chills and fatigue. Negative for diaphoresis, fever, unexpected weight change and weight loss.  HENT: Positive for congestion and sneezing. Negative for dental problem, drooling, ear discharge, ear pain, facial swelling, hearing loss, mouth sores, nosebleeds, postnasal drip, rhinorrhea, sinus pressure, sinus pain, sore throat, tinnitus, trouble swallowing and voice change.   Respiratory: Positive for cough and shortness of breath. Negative for apnea, hemoptysis, choking, chest tightness, wheezing and stridor.   Cardiovascular: Negative for chest pain, palpitations and leg swelling.  Gastrointestinal: Positive for diarrhea and nausea. Negative for abdominal distention, abdominal pain, anal bleeding, blood in stool, constipation, heartburn, rectal pain and vomiting.  Endocrine: Negative.   Genitourinary: Negative.   Musculoskeletal: Positive for arthralgias, joint swelling and neck pain. Negative for myalgias.  Skin: Negative.  Negative for rash.  Allergic/Immunologic: Negative.   Neurological: Positive for dizziness. Negative for headaches.  Psychiatric/Behavioral: Negative.     Social History   Tobacco Use  . Smoking status:  Never Smoker  . Smokeless tobacco: Never Used  Substance Use Topics  . Alcohol use: No      Objective:   BP 130/80   Pulse 79   Temp 98.6 F (37 C) (Oral)   Resp 16   Wt 144 lb 9.6 oz (65.6 kg)   SpO2 97%   BMI 28.24 kg/m  Vitals:   09/16/18 1133  BP: 130/80    Pulse: 79  Resp: 16  Temp: 98.6 F (37 C)  TempSrc: Oral  SpO2: 97%  Weight: 144 lb 9.6 oz (65.6 kg)     Physical Exam Vitals signs reviewed.  Constitutional:      General: She is not in acute distress.    Appearance: She is well-developed. She is not diaphoretic.  HENT:     Head: Normocephalic and atraumatic.     Right Ear: Hearing, tympanic membrane, ear canal and external ear normal.     Left Ear: Hearing, tympanic membrane, ear canal and external ear normal.     Nose: Nose normal.     Mouth/Throat:     Pharynx: Uvula midline. No oropharyngeal exudate.  Eyes:     General: No scleral icterus.       Right eye: No discharge.        Left eye: No discharge.     Conjunctiva/sclera: Conjunctivae normal.     Pupils: Pupils are equal, round, and reactive to light.  Neck:     Musculoskeletal: Normal range of motion and neck supple.     Thyroid: No thyromegaly.     Trachea: No tracheal deviation.  Cardiovascular:     Rate and Rhythm: Normal rate and regular rhythm.     Heart sounds: Normal heart sounds. No murmur. No friction rub. No gallop.   Pulmonary:     Effort: Pulmonary effort is normal. No respiratory distress.     Breath sounds: No stridor. Decreased breath sounds present. No wheezing, rhonchi or rales.  Abdominal:     General: Bowel sounds are normal. There is no distension.     Palpations: Abdomen is soft.     Tenderness: There is no abdominal tenderness.  Lymphadenopathy:     Cervical: No cervical adenopathy.  Skin:    General: Skin is warm and dry.        Assessment & Plan    1. Fatigue, unspecified type Unsure of cause but worried due to 9 pound unexplained weight loss with lack of appetite and bowel changes. Will check labs and imaging as below. If all normal may consider abdominal CT for further evaluation. I will f/u pending results. May consider stool studies as well if diarrhea continues. Has been evaluated by GI, but this was in 2018.  - CBC  w/Diff/Platelet - Basic Metabolic Panel (BMET) - TSH - Vitamin D (25 hydroxy)  2. Vitamin D deficiency H/O this. Taking 2000 IU daily. Will check labs as below and f/u pending results. - Vitamin D (25 hydroxy)  3. Weight loss See above medical treatment plan for #1.  - DG Abd 2 Views; Future  4. Nausea See above medical treatment plan for #1. - DG Abd 2 Views; Future  5. Diarrhea, unspecified type See above medical treatment plan for #1. - DG Abd 2 Views; Future     Mar Daring, PA-C  [facility]

## 2018-09-17 ENCOUNTER — Encounter: Payer: Medicare Other | Admitting: Physician Assistant

## 2018-09-17 DIAGNOSIS — L97829 Non-pressure chronic ulcer of other part of left lower leg with unspecified severity: Secondary | ICD-10-CM | POA: Diagnosis not present

## 2018-09-17 DIAGNOSIS — I10 Essential (primary) hypertension: Secondary | ICD-10-CM | POA: Diagnosis not present

## 2018-09-17 DIAGNOSIS — M1990 Unspecified osteoarthritis, unspecified site: Secondary | ICD-10-CM | POA: Diagnosis not present

## 2018-09-17 DIAGNOSIS — Z09 Encounter for follow-up examination after completed treatment for conditions other than malignant neoplasm: Secondary | ICD-10-CM | POA: Diagnosis not present

## 2018-09-17 LAB — CBC WITH DIFFERENTIAL/PLATELET
Basophils Absolute: 0.1 10*3/uL (ref 0.0–0.2)
Basos: 1 %
EOS (ABSOLUTE): 0.2 10*3/uL (ref 0.0–0.4)
Eos: 2 %
Hematocrit: 40.2 % (ref 34.0–46.6)
Hemoglobin: 13.2 g/dL (ref 11.1–15.9)
Immature Grans (Abs): 0.1 10*3/uL (ref 0.0–0.1)
Immature Granulocytes: 1 %
Lymphocytes Absolute: 1.3 10*3/uL (ref 0.7–3.1)
Lymphs: 10 %
MCH: 29.5 pg (ref 26.6–33.0)
MCHC: 32.8 g/dL (ref 31.5–35.7)
MCV: 90 fL (ref 79–97)
Monocytes Absolute: 1.1 10*3/uL — ABNORMAL HIGH (ref 0.1–0.9)
Monocytes: 8 %
Neutrophils Absolute: 10.2 10*3/uL — ABNORMAL HIGH (ref 1.4–7.0)
Neutrophils: 78 %
Platelets: 365 10*3/uL (ref 150–450)
RBC: 4.47 x10E6/uL (ref 3.77–5.28)
RDW: 13.1 % (ref 11.7–15.4)
WBC: 13 10*3/uL — ABNORMAL HIGH (ref 3.4–10.8)

## 2018-09-17 LAB — BASIC METABOLIC PANEL
BUN/Creatinine Ratio: 23 (ref 12–28)
BUN: 14 mg/dL (ref 8–27)
CO2: 24 mmol/L (ref 20–29)
Calcium: 9.2 mg/dL (ref 8.7–10.3)
Chloride: 103 mmol/L (ref 96–106)
Creatinine, Ser: 0.6 mg/dL (ref 0.57–1.00)
GFR calc Af Amer: 100 mL/min/{1.73_m2} (ref >59)
GFR calc non Af Amer: 86 mL/min/{1.73_m2} (ref >59)
Glucose: 74 mg/dL (ref 65–99)
Potassium: 4.6 mmol/L (ref 3.5–5.2)
Sodium: 143 mmol/L (ref 134–144)

## 2018-09-17 LAB — VITAMIN D 25 HYDROXY (VIT D DEFICIENCY, FRACTURES): Vit D, 25-Hydroxy: 27.9 ng/mL — ABNORMAL LOW (ref 30.0–100.0)

## 2018-09-17 LAB — TSH: TSH: 0.922 u[IU]/mL (ref 0.450–4.500)

## 2018-09-18 ENCOUNTER — Telehealth: Payer: Self-pay

## 2018-09-18 ENCOUNTER — Telehealth: Payer: Self-pay | Admitting: *Deleted

## 2018-09-18 DIAGNOSIS — R197 Diarrhea, unspecified: Secondary | ICD-10-CM

## 2018-09-18 DIAGNOSIS — R634 Abnormal weight loss: Secondary | ICD-10-CM

## 2018-09-18 NOTE — Telephone Encounter (Signed)
-----   Message from Mar Daring, [place] sent at 09/18/2018  9:45 AM EST ----- WBC count is slightly elevated with shift seemingly consistent with an infection of some kind. Sugar is normal. Kidney function is normal. Electrolytes are normal. Thyroid is normal. Vit D is borderline low. Would recommend OTC Vit D supplement of 1000-2000 IU daily. Would recommend to do stool testing next if you are still having diarrhea and no appetite.

## 2018-09-18 NOTE — Telephone Encounter (Signed)
Patient was notified of results. Patient is still having symptoms. Patient would like to proceed with the stool testing. Please advise?

## 2018-09-18 NOTE — Telephone Encounter (Signed)
LMOVM notifying pt she can come by office to pick up stool test kit.

## 2018-09-18 NOTE — Telephone Encounter (Signed)
-----   Message from Mar Daring, [place] sent at 09/16/2018  6:52 PM EST ----- Abdominal xray is normal. No constipation. No signs of bowel obstruction noted.

## 2018-09-18 NOTE — Telephone Encounter (Signed)
Patient was advised.  

## 2018-09-18 NOTE — Telephone Encounter (Signed)
Stool studies ordered

## 2018-09-18 NOTE — Telephone Encounter (Signed)
Patient has been advised. KW 

## 2018-09-18 NOTE — Telephone Encounter (Signed)
Pt returned missed call. Please call pt back,  Thanks, [place]

## 2018-09-19 NOTE — Progress Notes (Signed)
SHARAYAH, RENFROW ([id number]) Visit Report for 09/17/2018 Chief Complaint Document Details Patient Name: [name], [name]. Date of Service: 09/17/2018 10:15 AM Medical Record Number: [id number] Patient Account Number: [id number] Date of Birth/Sex: [birth year] (83 y.o. F) Treating RN: Harold Barban Primary Care Provider: Fenton Malling Other Clinician: Referring Provider: Fenton Malling Treating Provider/Extender: Melburn Hake, HOYT Weeks in Treatment: 21 Information Obtained from: Patient Chief Complaint LLE Ulcers Electronic Signature(s) Signed: 09/17/2018 5:12:14 PM By: [name] PA-C Entered By: [name] on 09/17/2018 10:36:08 HARRIETT, AZAR ([id number]) -------------------------------------------------------------------------------- HPI Details Patient Name: [name], [name] Date of Service: 09/17/2018 10:15 AM Medical Record Number: [id number] Patient Account Number: [id number] Date of Birth/Sex: 18-Jan-1936 (83 y.o. F) Treating RN: Harold Barban Primary Care Provider: Fenton Malling Other Clinician: Referring Provider: Fenton Malling Treating Provider/Extender: Melburn Hake, HOYT Weeks in Treatment: 21 History of Present Illness HPI Description: 04/23/18-She is seen in initial evaluation for multiple skin tears s/p fall on 8/19. She did sustain a closed fracture of the right patella secondary to that fall. She presented to emergency department on 8/19 for treatment. The left leg laceration was unable to be sutured closed, Steri-Strips were applied and she was treated with knee immobilizer for the patella fracture. She continues on Keflex that was initiated on 8/19. The left leg skin tear flap was cleansed and reapproximated, secure with steri strips. She will follow up next week 04/30/18-She is seen in follow-up evaluation for multiple skin tears. She continues with knee immobilizers to the right lower extremity, has developed lower extremity edema  with subsequent weeping; will initiate ace wrap compression. The flap is mostly devitalized, which was somewhat expected; we will change treatment plan and she will follow up next week 05/07/18-She is seen in follow-up evaluation for multiple wounds. The left lateral lower leg skin flap is nonviable/devitalized, now eschar covered. She tolerated debridement fairly well. The right lower extremity skin tear that was new last week has essentially healed. She has responded nicely to Ace wrap compression. We will modify treatment plan and she will follow-up next week 05/14/18 on evaluation today patient actually appears to be doing a little better in regard to her left lower extremity ulcers. With that being said she has been tolerating the dressing changes without complication. Medihoney is what she was switch to dear in the last week's visit. Prior to that we used Iodoflex when she had the eschar present. Nonetheless I believe that the Iodoflex may be the best thing for her at this point in regard to the current wound bed and prevention of biofilm buildup. Nonetheless she seems to be tolerating the dressing changes without complication which is good news. It's really the debridement the calls are the most pain last week she did allow for debridement this week she also agreed to allow me to attempt debridement in order to try to help this wound to heal more quickly. 05/21/18 on evaluation today patient actually appears to be doing much better at this time in regard to her left lateral lower Trinity ulcer. She's been tolerating the dressing changes without complication. Fortunately there does not appear to be any evidence of infection at this time. There is still some Slough noted on the surface of the wound although not as much as previous. She continues to have a lot of discomfort currently. She tells me that once we put the Iodoflex on the last time it actually burned from Thursday till Sunday night and  then eased off. Nonetheless it does  seem to have made a great improvement in general in regard to the patient's wound bed. 05/28/18 patient's wound bed at this point actually show signs of good improvement which is excellent news currently. She has been tolerating the dressing changes without complication. She does have a little bit of slightly hyper granular tissue at this point there's also some necrotic tissue noted on the surface of the wound. There does not appear to be any evidence of infection overall at this time. In general I feel like that she has made good progress in the past two weeks that I have been seeing her. 06/04/18 on evaluation today patient's left lower Trinity ulcer actually appears to be doing very well today. She has great improvement with excellent epithelialization at this time. Overall I feel like she has made great progress even since last time I seen her. There's really no necrotic tissue on the surface of the wound which is also great news her pain appears to be better. 06/18/18 on evaluation today the patient continues to show signs of improvement in regard to her lower extremity ulcer. She's been tolerating the Hydrofera Blue Dressing without complication and the wound bed seems to be doing excellent at this time. Overall I'm very pleased with the progress and were things stand. 07/02/18 on evaluation today patient actually appears to be doing excellent in regard to her left lower Trinity ulcer. This is definitely smaller and is shown signs of good improvement. It is going somewhat slow but nonetheless we have a steady progression of the wound which is great news. Overall very pleased with the progress that has been made. She's having very JILLIAN, WARTH. ([id number]) little pain at this time. 07/16/18 on evaluation today patient actually appears to be doing very well in regard to her lower extremity ulcer. She's been tolerating the dressing changes without  complication. Fortunately there is no evidence of anything infection wise occurring at this time. She barely is going to require debridement today. 08/06/18 the on evaluation today patient actually appears to be doing much better in regard to her lower Trinity ulcer. In fact this appears to be very close to complete healing. Fortunately there's no sign of infection at this time. The Hydrofera Blue Dressing to continues to stick somewhat to the wound bed unfortunately. 08/20/18 on evaluation today patient actually appears to be doing very well in regard to the left lower extremity ulcer. She has been tolerating the dressing changes without complications. She tolerates this very well. With that being said I do not see any evidence of infection at this time. Overall I think she is making wonderful progress is very close to complete healing. 09/03/17 on evaluation today patient appears to be doing very well in regard to her lower extremity ulcer. She show signs of good improvement which is excellent. When I last evaluated her this was definitely much larger and overall I feel like she is very close to complete healing although there is still a bit of healing left to take place for this to completely resolve. No fevers, chills, nausea, or vomiting noted at this time. 09/10/18 on evaluation today patient actually appears to be doing excellent at this point. In fact her wound appears to be pretty much healed. There still an area in the central portion of the wound with her some dry skin I do not believe there's anything open underneath this will again the area appears to be fairly fragile I do not want to cause any damage  by attempting to remove this. 09/17/18 patient on evaluation today appears to be completely healed at this point. There is no sign of worsening and general and she has no pain at the site. Electronic Signature(s) Signed: 09/17/2018 5:12:14 PM By: [name] PA-C Entered By: [name] on 09/17/2018 11:02:05 JORDANNA, HENDRIE ([id number]) -------------------------------------------------------------------------------- Physical Exam Details Patient Name: CLARYCE, FRIEL. Date of Service: 09/17/2018 10:15 AM Medical Record Number: [id number] Patient Account Number: [id number] Date of Birth/Sex: 09-18-1935 (82 y.o. F) Treating RN: Harold Barban Primary Care Provider: Fenton Malling Other Clinician: Referring Provider: Fenton Malling Treating Provider/Extender: Melburn Hake, HOYT Weeks in Treatment: 55 Constitutional Well-nourished and well-hydrated in no acute distress. Respiratory normal breathing without difficulty. Psychiatric this patient is able to make decisions and demonstrates good insight into disease process. Alert and Oriented x 3. pleasant and cooperative. Notes Patient's wound bed again shows complete epithelialization at this point which is excellent news overall I'm very pleased with how well she has done. Electronic Signature(s) Signed: 09/17/2018 5:12:14 PM By: [name] PA-C Entered By: [name] on 09/17/2018 11:02:37 ELEANORA, GUINYARD ([id number]) -------------------------------------------------------------------------------- Physician Orders Details Patient Name: ELIENAI, GAILEY. Date of Service: 09/17/2018 10:15 AM Medical Record Number: [id number] Patient Account Number: [id number] Date of Birth/Sex: [birth year] (83 y.o. F) Treating RN: Harold Barban Primary Care Provider: Fenton Malling Other Clinician: Referring Provider: Fenton Malling Treating Provider/Extender: Melburn Hake, HOYT Weeks in Treatment: 67 Verbal / Phone Orders: No Diagnosis Coding ICD-10 Coding Code Description [phone] Laceration without foreign body, left lower leg, sequela S80.212S Abrasion, left knee, sequela S80.211S Abrasion, right knee, sequela L97.822 Non-pressure chronic ulcer of other part of left lower leg with fat layer  exposed [place] Discharge From [facility] Services o Discharge from [place] on leg to keep moist and compression hose during the day Electronic Signature(s) Signed: 09/17/2018 4:21:01 PM By: Harold Barban Signed: 09/17/2018 5:12:14 PM By: [name] PA-C Entered By: Harold Barban on 09/17/2018 11:01:20 [name] ([id number]) -------------------------------------------------------------------------------- Problem List Details Patient Name: DUSTI, TETRO. Date of Service: 09/17/2018 10:15 AM Medical Record Number: [id number] Patient Account Number: [id number] Date of Birth/Sex: 02-01-1936 (83 y.o. F) Treating RN: Harold Barban Primary Care Provider: Fenton Malling Other Clinician: Referring Provider: Fenton Malling Treating Provider/Extender: Melburn Hake, HOYT Weeks in Treatment: 21 Active Problems ICD-10 Evaluated Encounter Code Description Active Date Today Diagnosis S81.812S Laceration without foreign body, left lower leg, sequela 04/23/2018 No Yes S80.212S Abrasion, left knee, sequela 04/23/2018 No Yes S80.211S Abrasion, right knee, sequela 04/23/2018 No Yes L97.822 Non-pressure chronic ulcer of other part of left lower leg with 05/14/2018 No Yes fat layer exposed Inactive Problems Resolved Problems Electronic Signature(s) Signed: 09/17/2018 5:12:14 PM By: [name] PA-C Entered By: [name] on 09/17/2018 10:36:02 [name] ([id number]) -------------------------------------------------------------------------------- Progress Note Details Patient Name: [name]. Date of Service: 09/17/2018 10:15 AM Medical Record Number: [id number] Patient Account Number: [id number] Date of Birth/Sex: [birth year] (83 y.o. F) Treating RN: Harold Barban Primary Care Provider: Fenton Malling Other Clinician: Referring Provider: Fenton Malling Treating Provider/Extender: Melburn Hake,  HOYT Weeks in Treatment: 21 Subjective Chief Complaint Information obtained from Patient LLE Ulcers History of Present Illness (HPI) 04/23/18-She is seen in initial evaluation for multiple skin tears s/p fall on 8/19. She did sustain a closed fracture of the right patella secondary to that fall. She presented to emergency department on 8/19 for treatment. The  left leg laceration was unable to be sutured closed, Steri-Strips were applied and she was treated with knee immobilizer for the patella fracture. She continues on Keflex that was initiated on 8/19. The left leg skin tear flap was cleansed and reapproximated, secure with steri strips. She will follow up next week 04/30/18-She is seen in follow-up evaluation for multiple skin tears. She continues with knee immobilizers to the right lower extremity, has developed lower extremity edema with subsequent weeping; will initiate ace wrap compression. The flap is mostly devitalized, which was somewhat expected; we will change treatment plan and she will follow up next week 05/07/18-She is seen in follow-up evaluation for multiple wounds. The left lateral lower leg skin flap is nonviable/devitalized, now eschar covered. She tolerated debridement fairly well. The right lower extremity skin tear that was new last week has essentially healed. She has responded nicely to Ace wrap compression. We will modify treatment plan and she will follow-up next week 05/14/18 on evaluation today patient actually appears to be doing a little better in regard to her left lower extremity ulcers. With that being said she has been tolerating the dressing changes without complication. Medihoney is what she was switch to dear in the last week's visit. Prior to that we used Iodoflex when she had the eschar present. Nonetheless I believe that the Iodoflex may be the best thing for her at this point in regard to the current wound bed and prevention of biofilm buildup.  Nonetheless she seems to be tolerating the dressing changes without complication which is good news. It's really the debridement the calls are the most pain last week she did allow for debridement this week she also agreed to allow me to attempt debridement in order to try to help this wound to heal more quickly. 05/21/18 on evaluation today patient actually appears to be doing much better at this time in regard to her left lateral lower Trinity ulcer. She's been tolerating the dressing changes without complication. Fortunately there does not appear to be any evidence of infection at this time. There is still some Slough noted on the surface of the wound although not as much as previous. She continues to have a lot of discomfort currently. She tells me that once we put the Iodoflex on the last time it actually burned from Thursday till Sunday night and then eased off. Nonetheless it does seem to have made a great improvement in general in regard to the patient's wound bed. 05/28/18 patient's wound bed at this point actually show signs of good improvement which is excellent news currently. She has been tolerating the dressing changes without complication. She does have a little bit of slightly hyper granular tissue at this point there's also some necrotic tissue noted on the surface of the wound. There does not appear to be any evidence of infection overall at this time. In general I feel like that she has made good progress in the past two weeks that I have been seeing her. 06/04/18 on evaluation today patient's left lower Trinity ulcer actually appears to be doing very well today. She has great improvement with excellent epithelialization at this time. Overall I feel like she has made great progress even since last time I seen her. There's really no necrotic tissue on the surface of the wound which is also great news her pain appears to be better. 06/18/18 on evaluation today the patient continues to  show signs of improvement in regard to her lower extremity ulcer. She's  LAMIKA, CONNOLLY ([id number]) been tolerating the [facility] Dressing without complication and the wound bed seems to be doing excellent at this time. Overall I'm very pleased with the progress and were things stand. 07/02/18 on evaluation today patient actually appears to be doing excellent in regard to her left lower Trinity ulcer. This is definitely smaller and is shown signs of good improvement. It is going somewhat slow but nonetheless we have a steady progression of the wound which is great news. Overall very pleased with the progress that has been made. She's having very little pain at this time. 07/16/18 on evaluation today patient actually appears to be doing very well in regard to her lower extremity ulcer. She's been tolerating the dressing changes without complication. Fortunately there is no evidence of anything infection wise occurring at this time. She barely is going to require debridement today. 08/06/18 the on evaluation today patient actually appears to be doing much better in regard to her lower Trinity ulcer. In fact this appears to be very close to complete healing. Fortunately there's no sign of infection at this time. The Hydrofera Blue Dressing to continues to stick somewhat to the wound bed unfortunately. 08/20/18 on evaluation today patient actually appears to be doing very well in regard to the left lower extremity ulcer. She has been tolerating the dressing changes without complications. She tolerates this very well. With that being said I do not see any evidence of infection at this time. Overall I think she is making wonderful progress is very close to complete healing. 09/03/17 on evaluation today patient appears to be doing very well in regard to her lower extremity ulcer. She show signs of good improvement which is excellent. When I last evaluated her this was definitely much larger and  overall I feel like she is very close to complete healing although there is still a bit of healing left to take place for this to completely resolve. No fevers, chills, nausea, or vomiting noted at this time. 09/10/18 on evaluation today patient actually appears to be doing excellent at this point. In fact her wound appears to be pretty much healed. There still an area in the central portion of the wound with her some dry skin I do not believe there's anything open underneath this will again the area appears to be fairly fragile I do not want to cause any damage by attempting to remove this. 09/17/18 patient on evaluation today appears to be completely healed at this point. There is no sign of worsening and general and she has no pain at the site. Patient History Information obtained from Patient. Family History Cancer - Mother, Lung Disease - Siblings, No family history of Diabetes, Heart Disease, Hereditary Spherocytosis, Hypertension, Kidney Disease, Seizures, Stroke, Thyroid Problems, Tuberculosis. Social History Never smoker, Marital Status - Married, Alcohol Use - Never, Drug Use - No History, Caffeine Use - Daily. Medical And Surgical History Notes Respiratory pulmonary fibrosis, home O2 at night and PRN Gastrointestinal diverticulosis Review of Systems (ROS) Constitutional Symptoms (General Health) Denies complaints or symptoms of Fever, Chills. Respiratory The patient has no complaints or symptoms. Cardiovascular The patient has no complaints or symptoms. Psychiatric The patient has no complaints or symptoms. NURA, CAHOON ([id number]) Objective Constitutional Well-nourished and well-hydrated in no acute distress. Vitals Time Taken: 10:17 AM, Height: 60 in, Weight: 151 lbs, BMI: 29.5, Temperature: 98.4 F, Pulse: 68 bpm, Respiratory Rate: 18 breaths/min, Blood Pressure: 120/55 mmHg. Respiratory normal breathing without difficulty. Psychiatric this patient  is able  to make decisions and demonstrates good insight into disease process. Alert and Oriented x 3. pleasant and cooperative. General Notes: Patient's wound bed again shows complete epithelialization at this point which is excellent news overall I'm very pleased with how well she has done. Integumentary (Hair, Skin) Wound #3 status is Open. Original cause of wound was Trauma. The wound is located on the Left,Lateral Lower Leg. The wound measures 0cm length x 0cm width x 0cm depth; 0cm^2 area and 0cm^3 volume. Assessment Active Problems ICD-10 Laceration without foreign body, left lower leg, sequela Abrasion, left knee, sequela Abrasion, right knee, sequela Non-pressure chronic ulcer of other part of left lower leg with fat layer exposed [place]: D/C [place] Discharge From [facility] Services: Discharge from [place] on leg to keep moist and compression hose during the day STACYE, NOORI ([id number]) Cephus Slater discontinue wound care services at this time. We will have her come back for follow-up visit as needed going forward if anything changes or worsens meantime she knows where we are at. I did recommend that she continue to wear a compression stocking over this area to both provide protection as well support until at least a month from now and then she can use the compression stocking as needed afterwards depending on her fluid and swelling. She's in agreement with this plan. Electronic Signature(s) Signed: 09/17/2018 5:12:14 PM By: [name] PA-C Entered By: [name] on 09/17/2018 11:03:00 CARRISA, KELLER ([id number]) -------------------------------------------------------------------------------- ROS/PFSH Details Patient Name: ETHELEAN, COLLA. Date of Service: 09/17/2018 10:15 AM Medical Record Number: [id number] Patient Account Number: [id number] Date of Birth/Sex: [birth year] (83 y.o. F) Treating RN: Harold Barban Primary  Care Provider: Fenton Malling Other Clinician: Referring Provider: Fenton Malling Treating Provider/Extender: Melburn Hake, HOYT Weeks in Treatment: 21 Information Obtained From Patient Wound History Do you currently have one or more open woundso Yes How many open wounds do you currently haveo 2 Approximately how long have you had your woundso 4 days How have you been treating your wound(s) until nowo bandage from the ER Has your wound(s) ever healed and then re-openedo No Have you had any lab work done in the past montho Yes Who ordered the lab work doneo [facility] ED Have you tested positive for an antibiotic resistant organism (MRSA, VRE)o No Have you tested positive for osteomyelitis (bone infection)o No Have you had any tests for circulation on your legso No Have you had other problems associated with your woundso Swelling Constitutional Symptoms (General Health) Complaints and Symptoms: Negative for: Fever; Chills Eyes Medical History: Negative for: Cataracts; Glaucoma; Optic Neuritis Ear/Nose/Mouth/Throat Medical History: Negative for: Chronic sinus problems/congestion; Middle ear problems Hematologic/Lymphatic Medical History: Negative for: Anemia; Hemophilia; Human Immunodeficiency Virus; Lymphedema; Sickle Cell Disease Respiratory Complaints and Symptoms: No Complaints or Symptoms Medical History: Negative for: Aspiration; Asthma; Chronic Obstructive Pulmonary Disease (COPD); Pneumothorax; Sleep Apnea; Tuberculosis Past Medical History Notes: pulmonary fibrosis, home O2 at night and PRN Cardiovascular DEOLA, REWIS ([id number]) Complaints and Symptoms: No Complaints or Symptoms Medical History: Positive for: Hypertension Negative for: Angina; Arrhythmia; Congestive Heart Failure; Coronary Artery Disease; Deep Vein Thrombosis; Hypotension; Myocardial Infarction; Peripheral Arterial Disease; Peripheral Venous Disease; Phlebitis;  Vasculitis Gastrointestinal Medical History: Negative for: Cirrhosis ; Colitis; Crohnos; Hepatitis A; Hepatitis B; Hepatitis C Past Medical History Notes: diverticulosis Endocrine Medical History: Negative for: Type I Diabetes; Type II Diabetes Genitourinary Medical History: Negative for: End Stage Renal Disease Immunological Medical History: Negative  for: Lupus Erythematosus; Raynaudos; Scleroderma Integumentary (Skin) Medical History: Negative for: History of Burn; History of pressure wounds Musculoskeletal Medical History: Positive for: Osteoarthritis Negative for: Gout; Rheumatoid Arthritis; Osteomyelitis Neurologic Medical History: Negative for: Dementia; Neuropathy; Quadriplegia; Paraplegia; Seizure Disorder Oncologic Medical History: Negative for: Received Chemotherapy; Received Radiation Psychiatric Complaints and Symptoms: No Complaints or Symptoms Medical History: Negative for: Anorexia/bulimia; Confinement Anxiety CHARDAY, CAPETILLO. ([id number]) Immunizations Pneumococcal Vaccine: Received Pneumococcal Vaccination: Yes Implantable Devices Family and Social History Cancer: Yes - Mother; Diabetes: No; Heart Disease: No; Hereditary Spherocytosis: No; Hypertension: No; Kidney Disease: No; Lung Disease: Yes - Siblings; Seizures: No; Stroke: No; Thyroid Problems: No; Tuberculosis: No; Never smoker; Marital Status - Married; Alcohol Use: Never; Drug Use: No History; Caffeine Use: Daily; Financial Concerns: No; Food, Clothing or Shelter Needs: No; Support System Lacking: No; Transportation Concerns: No; Advanced Directives: No; Patient does not want information on Advanced Directives Physician Affirmation I have reviewed and agree with the above information. Electronic Signature(s) Signed: 09/17/2018 4:21:01 PM By: Harold Barban Signed: 09/17/2018 5:12:14 PM By: [name] PA-C Entered By: [name] on 09/17/2018 11:02:20 TIMBERLEE, ROBLERO  ([id number]) -------------------------------------------------------------------------------- SuperBill Details Patient Name: DARTHY, MANGANELLI. Date of Service: 09/17/2018 Medical Record Number: [id number] Patient Account Number: [id number] Date of Birth/Sex: 18-Jul-1936 (83 y.o. F) Treating RN: Harold Barban Primary Care Provider: Fenton Malling Other Clinician: Referring Provider: Fenton Malling Treating Provider/Extender: Melburn Hake, HOYT Weeks in Treatment: 21 Diagnosis Coding ICD-10 Codes Code Description [phone] Laceration without foreign body, left lower leg, sequela S80.212S Abrasion, left knee, sequela S80.211S Abrasion, right knee, sequela L97.822 Non-pressure chronic ulcer of other part of left lower leg with fat layer exposed Facility Procedures CPT4 Code: [id number] Description: 09381 - WOUND CARE VISIT-LEV 2 EST PT Modifier: Quantity: 1 Physician Procedures CPT4 Code Description: [id number] 69678 - WC PHYS LEVEL 3 - EST PT ICD-10 Diagnosis Description S81.812S Laceration without foreign body, left lower leg, sequela S80.212S Abrasion, left knee, sequela S80.211S Abrasion, right knee, sequela L97.822  Non-pressure chronic ulcer of other part of left lower leg wit Modifier: h fat layer expos Quantity: 1 ed Electronic Signature(s) Signed: 09/17/2018 5:12:14 PM By: [name] PA-C Entered By: [name] on 09/17/2018 11:03:13

## 2018-09-19 NOTE — Progress Notes (Signed)
Ellenie Salome/Extender: Melburn Hake, HOYT Weeks in Treatment: 21 Active Inactive Electronic Signature(s) Signed: 09/17/2018 4:21:01 PM By: Harold Barban Entered By: Harold Barban on 09/17/2018 11:54:45 Jansson, [name] ([id number]) -------------------------------------------------------------------------------- Pain Assessment Details Patient Name: [name], [name]. Date of Service: 09/17/2018 10:15 AM Medical Record Number: [id number] Patient Account Number: [id number] Date of Birth/Sex: 12/10/1935 (83 y.o. F) Treating RN: Harold Barban Primary Care Vesna Kable: Fenton Malling Other Clinician: Referring Sacred Roa: Fenton Malling Treating Rozalia Dino/Extender: Melburn Hake, HOYT Weeks in Treatment: 21 Active Problems Location of Pain Severity and Description of Pain Patient Has Paino No Site Locations Pain Management and Medication Current Pain Management: Electronic Signature(s) Signed: 09/17/2018 4:16:05 PM By: Lorine Bears RCP, RRT, CHT Signed: 09/17/2018 4:21:01 PM By: Harold Barban Entered By: Lorine Bears on 09/17/2018 10:17:40 [name]  ([id number]) -------------------------------------------------------------------------------- Patient/Caregiver Education Details Patient Name: [name], [name]. Date of Service: 09/17/2018 10:15 AM Medical Record Number: [id number] Patient Account Number: [id number] Date of Birth/Gender: 1936/05/08 (83 y.o. F) Treating RN: Harold Barban Primary Care Physician: Fenton Malling Other Clinician: Referring Physician: Fenton Malling Treating Physician/Extender: Sharalyn Ink in Treatment: 21 Education Assessment Education Provided To: Patient Education Topics Provided Wound/Skin Impairment: Handouts: Caring for Your Ulcer Methods: Demonstration, Explain/Verbal Responses: State content correctly Electronic Signature(s) Signed: 09/17/2018 4:21:01 PM By: Harold Barban Entered By: Harold Barban on 09/17/2018 10:58:18 Lumley, [name] ([id number]) -------------------------------------------------------------------------------- Wound Assessment Details Patient Name: [name], [name]. Date of Service: 09/17/2018 10:15 AM Medical Record Number: [id number] Patient Account Number: [id number] Date of Birth/Sex: June 04, 1936 (83 y.o. F) Treating RN: Montey Hora Primary Care Traeger Sultana: Fenton Malling Other Clinician: Referring Manie Bealer: Fenton Malling Treating Cristela Stalder/Extender: Melburn Hake, HOYT Weeks in Treatment: 21 Wound Status Wound Number: 3 Primary Etiology: Skin Tear Wound Location: Left, Lateral Lower Leg Wound Status: Open Wounding Event: Trauma Date Acquired: 04/20/2018 Weeks Of Treatment: 21 Clustered Wound: No Wound Measurements Length: (cm) 0 Width: (cm) 0 Depth: (cm) 0 Area: (cm) 0 Volume: (cm) 0 % Reduction in Area: 100% % Reduction in Volume: 100% Wound Description Full Thickness Without Exposed Support Classification: Structures Periwound Skin Texture Texture Color No Abnormalities Noted: No No Abnormalities Noted:  No Moisture No Abnormalities Noted: No Electronic Signature(s) Signed: 09/17/2018 4:56:33 PM By: Montey Hora Entered By: Montey Hora on 09/17/2018 10:32:52 Sanderford, [name] ([id number]) -------------------------------------------------------------------------------- [place] Details Patient Name: [name]. Date of Service: 09/17/2018 10:15 AM Medical Record Number: [id number] Patient Account Number: [id number] Date of Birth/Sex: 1936-08-31 (83 y.o. F) Treating RN: Harold Barban Primary Care Vicke Plotner: Fenton Malling Other Clinician: Referring Atthew Coutant: Fenton Malling Treating Keyra Virella/Extender: Melburn Hake, HOYT Weeks in Treatment: 21 Vital Signs Time Taken: 10:17 Temperature (F): 98.4 Height (in): 60 Pulse (bpm): 68 Weight (lbs): 151 Respiratory Rate (breaths/min): 18 Body Mass Index (BMI): 29.5 Blood Pressure (mmHg): 120/55 Reference Range: 80 - 120 mg / dl Electronic Signature(s) Signed: 09/17/2018 4:16:05 PM By: Lorine Bears RCP, RRT, CHT Entered By: Lorine Bears on 09/17/2018 10:20:53  X- 1 5 Wound Imaging (photographs - any number of wounds) []  - 0 Wound Tracing (instead of photographs) X- 1 5 Simple Wound Measurement - one wound []  - 0 Complex Wound Measurement - multiple wounds INTERVENTIONS - Wound Dressings []  - Small Wound Dressing one or multiple wounds 0 []  - 0 Medium Wound Dressing one or multiple wounds []  - 0 Large Wound Dressing one or multiple wounds []  - 0 Application of Medications - topical []  - 0 Application of Medications - injection INTERVENTIONS - Miscellaneous []  - External ear exam 0 []  - 0 Specimen Collection (cultures, biopsies, blood, body fluids, etc.) []  - 0 Specimen(s) / Culture(s) sent or taken to Lab for analysis []  - 0 Patient Transfer (multiple staff / Civil Service fast streamer / Similar devices) []  - 0 Simple Staple / Suture removal (25 or less) []  - 0 Complex Staple / Suture removal (26 or more) []  - 0 Hypo / Hyperglycemic Management (close monitor of Blood Glucose) []  - 0 Ankle / Brachial Index (ABI) - do not check if billed separately X- 1 5 Vital Signs Kron, Danasia J. ([id number]) Has the patient been seen at the hospital within the last three years: Yes Total Score: 75 Level Of Care: New/Established - Level 2 Electronic Signature(s) Signed: 09/17/2018 4:21:01 PM By: Harold Barban Entered By: Harold Barban on 09/17/2018 10:59:18 Mula, [name] ([id number]) -------------------------------------------------------------------------------- Lower Extremity Assessment Details Patient Name: [name], [name]. Date of Service: 09/17/2018 10:15 AM Medical Record Number: [id number] Patient Account Number: [id number] Date of Birth/Sex: [birth year] (83 y.o. F) Treating RN: Montey Hora Primary Care Cedar Roseman:  Fenton Malling Other Clinician: Referring Calli Bashor: Fenton Malling Treating Neita Landrigan/Extender: Melburn Hake, HOYT Weeks in Treatment: 21 Vascular Assessment Pulses: Dorsalis Pedis Palpable: [Left:Yes] Posterior Tibial Extremity colors, hair growth, and conditions: Extremity Color: [Left:Normal] Hair Growth on Extremity: [Left:Yes] Temperature of Extremity: [Left:Warm] Capillary Refill: [Left:< 3 seconds] Toe Nail Assessment Left: Right: Thick: Yes Discolored: Yes Deformed: No Improper Length and Hygiene: No Electronic Signature(s) Signed: 09/17/2018 4:56:33 PM By: Montey Hora Entered By: Montey Hora on 09/17/2018 10:33:26 Yow, [name] ([id number]) -------------------------------------------------------------------------------- Multi Wound Chart Details Patient Name: [name]. Date of Service: 09/17/2018 10:15 AM Medical Record Number: [id number] Patient Account Number: [id number] Date of Birth/Sex: 1935-10-22 (83 y.o. F) Treating RN: Harold Barban Primary Care Nahshon Reich: Fenton Malling Other Clinician: Referring Livan Hires: Fenton Malling Treating Nickalas Mccarrick/Extender: Melburn Hake, HOYT Weeks in Treatment: 21 Vital Signs Height(in): 60 Pulse(bpm): 68 Weight(lbs): 151 Blood Pressure(mmHg): 120/55 Body Mass Index(BMI): 29 Temperature(F): 98.4 Respiratory Rate 18 (breaths/min): Photos: [3:No Photos] [N/A:N/A] Wound Location: [3:Left, Lateral Lower Leg] [N/A:N/A] Wounding Event: [3:Trauma] [N/A:N/A] Primary Etiology: [3:Skin Tear] [N/A:N/A] Date Acquired: [3:04/20/2018] [N/A:N/A] Weeks of Treatment: [3:21] [N/A:N/A] Wound Status: [3:Open] [N/A:N/A] Measurements L x W x D [3:0x0x0] [N/A:N/A] (cm) Area (cm) : [3:0] [N/A:N/A] Volume (cm) : [3:0] [N/A:N/A] % Reduction in Area: [3:100.00%] [N/A:N/A] % Reduction in Volume: [3:100.00%] [N/A:N/A] Classification: [3:Full Thickness Without Exposed Support Structures] [N/A:N/A] Periwound Skin  Texture: [3:No Abnormalities Noted] [N/A:N/A] Periwound Skin Moisture: [3:No Abnormalities Noted] [N/A:N/A] Periwound Skin Color: [3:No Abnormalities Noted No] [N/A:N/A N/A] Treatment Notes Electronic Signature(s) Signed: 09/17/2018 4:21:01 PM By: Harold Barban Entered By: Harold Barban on 09/17/2018 10:58:03 [name] ([id number]) -------------------------------------------------------------------------------- [place] Details Patient Name: [name], [name]. Date of Service: 09/17/2018 10:15 AM Medical Record Number: [id number] Patient Account Number: [id number] Date of Birth/Sex: 1936-06-11 (83 y.o. F) Treating RN: Harold Barban Primary Care Bambie Pizzolato: Fenton Malling Other Clinician: Referring Zayley Arras: Fenton Malling Treating  X- 1 5 Wound Imaging (photographs - any number of wounds) []  - 0 Wound Tracing (instead of photographs) X- 1 5 Simple Wound Measurement - one wound []  - 0 Complex Wound Measurement - multiple wounds INTERVENTIONS - Wound Dressings []  - Small Wound Dressing one or multiple wounds 0 []  - 0 Medium Wound Dressing one or multiple wounds []  - 0 Large Wound Dressing one or multiple wounds []  - 0 Application of Medications - topical []  - 0 Application of Medications - injection INTERVENTIONS - Miscellaneous []  - External ear exam 0 []  - 0 Specimen Collection (cultures, biopsies, blood, body fluids, etc.) []  - 0 Specimen(s) / Culture(s) sent or taken to Lab for analysis []  - 0 Patient Transfer (multiple staff / Civil Service fast streamer / Similar devices) []  - 0 Simple Staple / Suture removal (25 or less) []  - 0 Complex Staple / Suture removal (26 or more) []  - 0 Hypo / Hyperglycemic Management (close monitor of Blood Glucose) []  - 0 Ankle / Brachial Index (ABI) - do not check if billed separately X- 1 5 Vital Signs Kron, Danasia J. ([id number]) Has the patient been seen at the hospital within the last three years: Yes Total Score: 75 Level Of Care: New/Established - Level 2 Electronic Signature(s) Signed: 09/17/2018 4:21:01 PM By: Harold Barban Entered By: Harold Barban on 09/17/2018 10:59:18 Mula, [name] ([id number]) -------------------------------------------------------------------------------- Lower Extremity Assessment Details Patient Name: [name], [name]. Date of Service: 09/17/2018 10:15 AM Medical Record Number: [id number] Patient Account Number: [id number] Date of Birth/Sex: [birth year] (83 y.o. F) Treating RN: Montey Hora Primary Care Cedar Roseman:  Fenton Malling Other Clinician: Referring Calli Bashor: Fenton Malling Treating Neita Landrigan/Extender: Melburn Hake, HOYT Weeks in Treatment: 21 Vascular Assessment Pulses: Dorsalis Pedis Palpable: [Left:Yes] Posterior Tibial Extremity colors, hair growth, and conditions: Extremity Color: [Left:Normal] Hair Growth on Extremity: [Left:Yes] Temperature of Extremity: [Left:Warm] Capillary Refill: [Left:< 3 seconds] Toe Nail Assessment Left: Right: Thick: Yes Discolored: Yes Deformed: No Improper Length and Hygiene: No Electronic Signature(s) Signed: 09/17/2018 4:56:33 PM By: Montey Hora Entered By: Montey Hora on 09/17/2018 10:33:26 Yow, [name] ([id number]) -------------------------------------------------------------------------------- Multi Wound Chart Details Patient Name: [name]. Date of Service: 09/17/2018 10:15 AM Medical Record Number: [id number] Patient Account Number: [id number] Date of Birth/Sex: 1935-10-22 (83 y.o. F) Treating RN: Harold Barban Primary Care Nahshon Reich: Fenton Malling Other Clinician: Referring Livan Hires: Fenton Malling Treating Nickalas Mccarrick/Extender: Melburn Hake, HOYT Weeks in Treatment: 21 Vital Signs Height(in): 60 Pulse(bpm): 68 Weight(lbs): 151 Blood Pressure(mmHg): 120/55 Body Mass Index(BMI): 29 Temperature(F): 98.4 Respiratory Rate 18 (breaths/min): Photos: [3:No Photos] [N/A:N/A] Wound Location: [3:Left, Lateral Lower Leg] [N/A:N/A] Wounding Event: [3:Trauma] [N/A:N/A] Primary Etiology: [3:Skin Tear] [N/A:N/A] Date Acquired: [3:04/20/2018] [N/A:N/A] Weeks of Treatment: [3:21] [N/A:N/A] Wound Status: [3:Open] [N/A:N/A] Measurements L x W x D [3:0x0x0] [N/A:N/A] (cm) Area (cm) : [3:0] [N/A:N/A] Volume (cm) : [3:0] [N/A:N/A] % Reduction in Area: [3:100.00%] [N/A:N/A] % Reduction in Volume: [3:100.00%] [N/A:N/A] Classification: [3:Full Thickness Without Exposed Support Structures] [N/A:N/A] Periwound Skin  Texture: [3:No Abnormalities Noted] [N/A:N/A] Periwound Skin Moisture: [3:No Abnormalities Noted] [N/A:N/A] Periwound Skin Color: [3:No Abnormalities Noted No] [N/A:N/A N/A] Treatment Notes Electronic Signature(s) Signed: 09/17/2018 4:21:01 PM By: Harold Barban Entered By: Harold Barban on 09/17/2018 10:58:03 [name] ([id number]) -------------------------------------------------------------------------------- [place] Details Patient Name: [name], [name]. Date of Service: 09/17/2018 10:15 AM Medical Record Number: [id number] Patient Account Number: [id number] Date of Birth/Sex: 1936-06-11 (83 y.o. F) Treating RN: Harold Barban Primary Care Bambie Pizzolato: Fenton Malling Other Clinician: Referring Zayley Arras: Fenton Malling Treating

## 2018-09-21 DIAGNOSIS — S81812D Laceration without foreign body, left lower leg, subsequent encounter: Secondary | ICD-10-CM | POA: Diagnosis not present

## 2018-09-21 DIAGNOSIS — Z9981 Dependence on supplemental oxygen: Secondary | ICD-10-CM | POA: Diagnosis not present

## 2018-09-21 DIAGNOSIS — M150 Primary generalized (osteo)arthritis: Secondary | ICD-10-CM | POA: Diagnosis not present

## 2018-09-21 DIAGNOSIS — J84112 Idiopathic pulmonary fibrosis: Secondary | ICD-10-CM | POA: Diagnosis not present

## 2018-09-21 DIAGNOSIS — Z9181 History of falling: Secondary | ICD-10-CM | POA: Diagnosis not present

## 2018-09-21 DIAGNOSIS — I10 Essential (primary) hypertension: Secondary | ICD-10-CM | POA: Diagnosis not present

## 2018-09-23 DIAGNOSIS — M818 Other osteoporosis without current pathological fracture: Secondary | ICD-10-CM | POA: Diagnosis not present

## 2018-09-29 ENCOUNTER — Encounter: Payer: Self-pay | Admitting: Primary Care

## 2018-09-29 ENCOUNTER — Ambulatory Visit (INDEPENDENT_AMBULATORY_CARE_PROVIDER_SITE_OTHER): Payer: Medicare Other | Admitting: Primary Care

## 2018-09-29 VITALS — BP 122/80 | HR 69 | Temp 98.0°F | Ht 58.5 in | Wt 144.0 lb

## 2018-09-29 DIAGNOSIS — E559 Vitamin D deficiency, unspecified: Secondary | ICD-10-CM | POA: Diagnosis not present

## 2018-09-29 DIAGNOSIS — M1990 Unspecified osteoarthritis, unspecified site: Secondary | ICD-10-CM | POA: Diagnosis not present

## 2018-09-29 DIAGNOSIS — Q782 Osteopetrosis: Secondary | ICD-10-CM

## 2018-09-29 DIAGNOSIS — F419 Anxiety disorder, unspecified: Secondary | ICD-10-CM | POA: Diagnosis not present

## 2018-09-29 DIAGNOSIS — R197 Diarrhea, unspecified: Secondary | ICD-10-CM | POA: Diagnosis not present

## 2018-09-29 DIAGNOSIS — N814 Uterovaginal prolapse, unspecified: Secondary | ICD-10-CM

## 2018-09-29 DIAGNOSIS — J8410 Pulmonary fibrosis, unspecified: Secondary | ICD-10-CM | POA: Diagnosis not present

## 2018-09-29 DIAGNOSIS — E7800 Pure hypercholesterolemia, unspecified: Secondary | ICD-10-CM | POA: Diagnosis not present

## 2018-09-29 DIAGNOSIS — I10 Essential (primary) hypertension: Secondary | ICD-10-CM | POA: Diagnosis not present

## 2018-09-29 DIAGNOSIS — R5383 Other fatigue: Secondary | ICD-10-CM | POA: Diagnosis not present

## 2018-09-29 LAB — CBC WITH DIFFERENTIAL/PLATELET
Basophils Absolute: 0.1 10*3/uL (ref 0.0–0.1)
Basophils Relative: 0.9 % (ref 0.0–3.0)
Eosinophils Absolute: 1.2 10*3/uL — ABNORMAL HIGH (ref 0.0–0.7)
Eosinophils Relative: 8.1 % — ABNORMAL HIGH (ref 0.0–5.0)
HCT: 39 % (ref 36.0–46.0)
Hemoglobin: 12.5 g/dL (ref 12.0–15.0)
Lymphocytes Relative: 8.5 % — ABNORMAL LOW (ref 12.0–46.0)
Lymphs Abs: 1.3 10*3/uL (ref 0.7–4.0)
MCHC: 32.1 g/dL (ref 30.0–36.0)
MCV: 91.9 fl (ref 78.0–100.0)
Monocytes Absolute: 1.4 10*3/uL — ABNORMAL HIGH (ref 0.1–1.0)
Monocytes Relative: 9.4 % (ref 3.0–12.0)
Neutro Abs: 11.2 10*3/uL — ABNORMAL HIGH (ref 1.4–7.7)
Neutrophils Relative %: 73.1 % (ref 43.0–77.0)
Platelets: 322 10*3/uL (ref 150.0–400.0)
RBC: 4.24 Mil/uL (ref 3.87–5.11)
RDW: 14.8 % (ref 11.5–15.5)
WBC: 15.3 10*3/uL — ABNORMAL HIGH (ref 4.0–10.5)

## 2018-09-29 LAB — BASIC METABOLIC PANEL
BUN: 10 mg/dL (ref 6–23)
CO2: 29 mEq/L (ref 19–32)
Calcium: 8.9 mg/dL (ref 8.4–10.5)
Chloride: 103 mEq/L (ref 96–112)
Creatinine, Ser: 0.58 mg/dL (ref 0.40–1.20)
GFR: 99.94 mL/min (ref >60.00)
Glucose, Bld: 83 mg/dL (ref 70–99)
Potassium: 3.9 mEq/L (ref 3.5–5.1)
Sodium: 140 mEq/L (ref 135–145)

## 2018-09-29 NOTE — Addendum Note (Signed)
Addended by: Ellamae Sia on: 09/29/2018 02:26 PM   Modules accepted: Orders

## 2018-09-29 NOTE — Patient Instructions (Addendum)
Stop by the lab prior to leaving today. I will notify you of your results once received. Please return the stool kit as discussed, this is important.   Make sure to drink plenty of water to prevent dehydration.   Advance your diet slowly, take a look at the information below.  It was a pleasure to meet you today! Please don't hesitate to call or message me with any questions. Welcome to [facility]!   Food Choices to Help Relieve Diarrhea, Adult When you have diarrhea, the foods you eat and your eating habits are very important. Choosing the right foods and drinks can help:  Relieve diarrhea.  Replace lost fluids and nutrients.  Prevent dehydration. What general guidelines should I follow?  Relieving diarrhea  Choose foods with less than 2 g or .07 oz. of fiber per serving.  Limit fats to less than 8 tsp (38 g or 1.34 oz.) a day.  Avoid the following: ? Foods and beverages sweetened with high-fructose corn syrup, honey, or sugar alcohols such as xylitol, sorbitol, and mannitol. ? Foods that contain a lot of fat or sugar. ? Fried, greasy, or spicy foods. ? High-fiber grains, breads, and cereals. ? Raw fruits and vegetables.  Eat foods that are rich in probiotics. These foods include dairy products such as yogurt and fermented milk products. They help increase healthy bacteria in the stomach and intestines (gastrointestinal tract, or GI tract).  If you have lactose intolerance, avoid dairy products. These may make your diarrhea worse.  Take medicine to help stop diarrhea (antidiarrheal medicine) only as told by your health care provider. Replacing nutrients  Eat small meals or snacks every 3-4 hours.  Eat bland foods, such as white rice, toast, or baked potato, until your diarrhea starts to get better. Gradually reintroduce nutrient-rich foods as tolerated or as told by your health care provider. This includes: ? Well-cooked protein foods. ? Peeled, seeded, and soft-cooked  fruits and vegetables. ? Low-fat dairy products.  Take vitamin and mineral supplements as told by your health care provider. Preventing dehydration  Start by sipping water or a special solution to prevent dehydration (oral rehydration solution, ORS). Urine that is clear or pale yellow means that you are getting enough fluid.  Try to drink at least 8-10 cups of fluid each day to help replace lost fluids.  You may add other liquids in addition to water, such as clear juice or decaffeinated sports drinks, as tolerated or as told by your health care provider.  Avoid drinks with caffeine, such as coffee, tea, or soft drinks.  Avoid alcohol. What foods are recommended?     The items listed may not be a complete list. Talk with your health care provider about what dietary choices are best for you. Grains White rice. White, [place], or pita breads (fresh or toasted), including plain rolls, buns, or bagels. White pasta. Saltine, soda, or graham crackers. Pretzels. Low-fiber cereal. Cooked cereals made with water (such as cornmeal, farina, or cream cereals). Plain muffins. Matzo. Melba toast. Zwieback. Vegetables Potatoes (without the skin). Most well-cooked and canned vegetables without skins or seeds. Tender lettuce. Fruits Apple sauce. Fruits canned in juice. Cooked apricots, cherries, grapefruit, peaches, pears, or plums. Fresh bananas and cantaloupe. Meats and other protein foods Baked or boiled chicken. Eggs. Tofu. Fish. Seafood. Smooth nut butters. Ground or well-cooked tender beef, ham, veal, lamb, pork, or poultry. Dairy Plain yogurt, kefir, and unsweetened liquid yogurt. Lactose-free milk, buttermilk, skim milk, or soy milk. Low-fat or nonfat  hard cheese. Beverages Water. Low-calorie sports drinks. Fruit juices without pulp. Strained tomato and vegetable juices. Decaffeinated teas. Sugar-free beverages not sweetened with sugar alcohols. Oral rehydration solutions, if approved by your  health care provider. Seasoning and other foods Bouillon, broth, or soups made from recommended foods. What foods are not recommended? The items listed may not be a complete list. Talk with your health care provider about what dietary choices are best for you. Grains Whole grain, whole wheat, bran, or rye breads, rolls, pastas, and crackers. Wild or brown rice. Whole grain or bran cereals. Barley. Oats and oatmeal. Corn tortillas or taco shells. Granola. Popcorn. Vegetables Raw vegetables. Fried vegetables. Cabbage, broccoli, Brussels sprouts, artichokes, baked beans, beet greens, corn, kale, legumes, peas, sweet potatoes, and yams. Potato skins. Cooked spinach and cabbage. Fruits Dried fruit, including raisins and dates. Raw fruits. Stewed or dried prunes. Canned fruits with syrup. Meat and other protein foods Fried or fatty meats. Deli meats. Chunky nut butters. Nuts and seeds. Beans and lentils. Berniece Salines. Hot dogs. Sausage. Dairy High-fat cheeses. Whole milk, chocolate milk, and beverages made with milk, such as milk shakes. Half-and-half. Cream. sour cream. Ice cream. Beverages Caffeinated beverages (such as coffee, tea, soda, or energy drinks). Alcoholic beverages. Fruit juices with pulp. Prune juice. Soft drinks sweetened with high-fructose corn syrup or sugar alcohols. High-calorie sports drinks. Fats and oils Butter. Cream sauces. Margarine. Salad oils. Plain salad dressings. Olives. Avocados. Mayonnaise. Sweets and desserts Sweet rolls, doughnuts, and sweet breads. Sugar-free desserts sweetened with sugar alcohols such as xylitol and sorbitol. Seasoning and other foods Honey. Hot sauce. Chili powder. Gravy. Cream-based or milk-based soups. Pancakes and waffles. Summary  When you have diarrhea, the foods you eat and your eating habits are very important.  Make sure you get at least 8-10 cups of fluid each day, or enough to keep your urine clear or pale yellow.  Eat bland foods and  gradually reintroduce healthy, nutrient-rich foods as tolerated, or as told by your health care provider.  Avoid high-fiber, fried, greasy, or spicy foods. This information is not intended to replace advice given to you by your health care provider. Make sure you discuss any questions you have with your health care provider. Document Released: 11/09/2003 Document Revised: 08/16/2016 Document Reviewed: 08/16/2016 Elsevier Interactive Patient Education  2019 [employer].

## 2018-09-29 NOTE — Assessment & Plan Note (Signed)
Lipid panel from 2018 reviewed and was normal. We will repeat this during upcoming physical.

## 2018-09-29 NOTE — Assessment & Plan Note (Signed)
With subsequent compression fracture. Following with orthopedics and recently started Prolia injections. Recent bone density scan reviewed.

## 2018-09-29 NOTE — Assessment & Plan Note (Signed)
Normotensive in the office today.  Continue to monitor off medication.

## 2018-09-29 NOTE — Progress Notes (Signed)
Subjective:    Patient ID: [name], female    DOB: [birth year], 83 y.o.   MRN: [id number]  HPI  [name] is an 83 year old female who presents today to establish care and discuss the problems mentioned below. Will obtain old records.  1) Osteoporosis: History of compression fracture to L4. She is currently following with orthopedics and is managed on Prolia injections with her first injection recently. Previously managed on Reclast. Recent bone density scan worse.    2) Osteoarthritis: History of bilateral rotator cuff repair 2011, 2010; reverse left shoulder replacement in 2012. Also with chronic back pain, bilateral foot pain. She is following with [facility]. Dr. Cristy Folks is providing her with her hydromorphone for which she takes 3-4 times daily. She is undergoing injections to the left foot. Managed on Voltaren gel for her shoulders.   3) GAD: Currently managed on citalopram 20 mg for which she's been taking for years. Overall feels well managed on her regimen.   4) Pulmonary Fibrosis: Currently managed on Esbriet 267 mg. She is following with Dr. Raul Del through pulmonology. She uses supplemental oxygen during the night, 2 liters.   5) Cystocele: Currently managed on estradiol vaginal cream. Pessary in place.   6) Essential Hypertension: Prior history in the past, once medication years ago. She denies chest pain, dizziness.  BP Readings from Last 3 Encounters:  09/29/18 122/80  09/16/18 130/80  09/05/18 119/81   7) Hyperlipidemia: Currently not managed on medication. Lipid panel from Summer 2018 with LDL of 103, Trigs of 137, HDl of 69.   8) Diarrhea: Present for the last 2 months, also with decrease in appetite. She feels hungry but doesn't want to eat anything. Occurs within 30 minutes after eating, every meal. Also occurs if she doesn't eat. She's had three episodes of diarrhea this morning, soft consistently.  Her last meal was last night.  She is had little  intake of liquids as they do not taste good.  She has been on antibiotics within the last several months. She denies bloody stools, abdominal pain, nausea/vomiting, fevers, cough/cold symptoms, dysuria, hematuria.   Of note she was evaluated by her prior PCP on September 16, 2018 with same complaints.  Thorough work-up completed including CBC, BMP, TSH, vitamin D, plain films of the abdomen.  Work-up overall unremarkable therefore stool studies were added.  The patient never returned for stool studies.  Review of Systems  Constitutional: Positive for fatigue. Negative for fever.  Respiratory: Negative for shortness of breath.   Cardiovascular: Negative for chest pain.  Gastrointestinal: Positive for diarrhea. Negative for abdominal pain, blood in stool, nausea and vomiting.  Genitourinary: Negative for dysuria and hematuria.  Musculoskeletal: Positive for arthralgias and back pain.  Skin: Negative for color change.  Neurological: Negative for dizziness and headaches.  Hematological: Negative for adenopathy.  Psychiatric/Behavioral:       Feels well managed on citalopram.       Past Medical History:  Diagnosis Date  . Altered mental state   . Anxiety   . BMI 30.0-30.9,adult   . Cellulitis   . Chest pain, atypical   . Compression fracture of L4 lumbar vertebra   . Constipation   . Cystitis   . Cystocele   . Diverticulosis   . Fatigue   . Fibrosis, idiopathic pulmonary ([place])   . Gastroenteritis   . History of back surgery   . History of herniated intervertebral disc   . HTN (hypertension)   .  Hypercholesteremia   . Hypocalcemia   . Incontinence of urine   . OA (osteoarthritis)    shoulder and back  . Obesity   . Osteopenia   . Pneumonia   . Shortness of breath   . Sleep apnea   . Thrush   . Vitamin D deficiency      Social History   Socioeconomic History  . Marital status: Married    Spouse name: [name]  . Number of children: 2  . Years of education: Not on file    . Highest education level: Not on file  Occupational History  . Not on file  Social Needs  . Financial resource strain: Not on file  . Food insecurity:    Worry: Not on file    Inability: Not on file  . Transportation needs:    Medical: Not on file    Non-medical: Not on file  Tobacco Use  . Smoking status: Never Smoker  . Smokeless tobacco: Never Used  Substance and Sexual Activity  . Alcohol use: No  . Drug use: No  . Sexual activity: Never    Birth control/protection: None  Lifestyle  . Physical activity:    Days per week: Not on file    Minutes per session: Not on file  . Stress: Not on file  Relationships  . Social connections:    Talks on phone: Not on file    Gets together: Not on file    Attends religious service: Not on file    Active member of club or organization: Not on file    Attends meetings of clubs or organizations: Not on file    Relationship status: Not on file  . Intimate partner violence:    Fear of current or ex partner: Not on file    Emotionally abused: Not on file    Physically abused: Not on file    Forced sexual activity: Not on file  Other Topics Concern  . Not on file  Social History Narrative  . Not on file    Past Surgical History:  Procedure Laterality Date  . CATARACT EXTRACTION W/PHACO Left 04/24/2016   Procedure: CATARACT EXTRACTION PHACO AND INTRAOCULAR LENS PLACEMENT (IOC);  Surgeon: Leandrew Koyanagi, MD;  Location: [place];  Service: Ophthalmology;  Laterality: Left;  . CATARACT EXTRACTION W/PHACO Right 07/14/2017   Procedure: CATARACT EXTRACTION PHACO AND INTRAOCULAR LENS PLACEMENT ([place])  RIGHT;  Surgeon: Leandrew Koyanagi, MD;  Location: [place];  Service: Ophthalmology;  Laterality: Right;  . ROTATOR CUFF REPAIR Bilateral    left-12/2009; right-03/2009 dr.califf  . TOTAL SHOULDER REPLACEMENT Left     Family History  Problem Relation Age of Onset  . COPD Mother   . Cancer Brother         colon    No Known Allergies  Current Outpatient Medications on File Prior to Visit  Medication Sig Dispense Refill  . albuterol (PROVENTIL HFA;VENTOLIN HFA) 108 (90 Base) MCG/ACT inhaler Inhale into the lungs.    . citalopram (CELEXA) 20 MG tablet     . Cyanocobalamin (B-12) 2500 MCG TABS Take 2,500 mcg by mouth daily.    Marland Kitchen denosumab (PROLIA) 60 MG/ML SOSY injection Inject 60 mg into the skin every 6 (six) months.    . ESBRIET 267 MG TABS Take 6 tablets by mouth daily.     Marland Kitchen estradiol (ESTRACE VAGINAL) 0.1 MG/GM vaginal cream Place 1 Applicatorful vaginally once a week. (1gram weekly) 42.5 g 2  . HYDROmorphone (DILAUDID)  2 MG tablet Take 2 mg by mouth 2 (two) times daily.     Marland Kitchen nystatin ointment (MYCOSTATIN) Apply 1 application topically 2 (two) times daily. 30 g 0  . OXYGEN Inhale 2 L into the lungs as needed.    . VOLTAREN 1 % GEL   2   No current facility-administered medications on file prior to visit.     BP 122/80   Pulse 69   Temp 98 F (36.7 C) (Oral)   Ht 4' 10.5" (1.486 m)   Wt 144 lb (65.3 kg)   SpO2 98%   BMI 29.58 kg/m    Objective:   Physical Exam  Constitutional: She appears well-nourished.  Neck: Neck supple.  Cardiovascular: Normal rate and regular rhythm.  Respiratory: Effort normal and breath sounds normal.  GI: Soft. Bowel sounds are normal. There is no abdominal tenderness.  Musculoskeletal:     Comments: Decrease in range of motion to lumbar spine  Skin: Skin is warm and dry.  Psychiatric: She has a normal mood and affect.           Assessment & Plan:

## 2018-09-29 NOTE — Assessment & Plan Note (Signed)
Overall doing well with pessary and vaginal cream.

## 2018-09-29 NOTE — Assessment & Plan Note (Signed)
Recent lower level, she has increased her vitamin D intake. Continue to monitor.

## 2018-09-29 NOTE — Assessment & Plan Note (Signed)
Chronic pain to lower back and now bilateral feet. Following with orthopedics who is supplying her hydromorphone. Discussed that I would not be prescribing this medication and that she will need to continue to get it from orthopedics.

## 2018-09-29 NOTE — Assessment & Plan Note (Signed)
Present for the last 2 months. Overall work-up has been unremarkable. She has been on antibiotics for the last 2 months, therefore stool studies are pending now. She appears stable for outpatient treatment, vitals within normal limits. Strongly advise she start intake of oral liquids and advance diet as tolerated.  Handout provided.

## 2018-09-29 NOTE — Assessment & Plan Note (Signed)
Overall feels well managed on citalopram.  Continue same.

## 2018-09-29 NOTE — Assessment & Plan Note (Signed)
Overall stable, following with pulmonology. Continue current regimen.

## 2018-09-29 NOTE — Assessment & Plan Note (Signed)
Unclear etiology as prior work-up has been unremarkable. Could be secondary to recurrent diarrhea, work-up in progress.

## 2018-09-30 ENCOUNTER — Other Ambulatory Visit: Payer: Medicare Other

## 2018-09-30 DIAGNOSIS — K625 Hemorrhage of anus and rectum: Secondary | ICD-10-CM | POA: Diagnosis not present

## 2018-09-30 DIAGNOSIS — R197 Diarrhea, unspecified: Secondary | ICD-10-CM | POA: Diagnosis not present

## 2018-09-30 DIAGNOSIS — R5383 Other fatigue: Secondary | ICD-10-CM

## 2018-10-01 ENCOUNTER — Encounter: Payer: Self-pay | Admitting: Primary Care

## 2018-10-01 DIAGNOSIS — M25551 Pain in right hip: Secondary | ICD-10-CM | POA: Diagnosis not present

## 2018-10-01 DIAGNOSIS — M533 Sacrococcygeal disorders, not elsewhere classified: Secondary | ICD-10-CM | POA: Diagnosis not present

## 2018-10-02 LAB — GASTROINTESTINAL PATHOGEN PANEL PCR
C. difficile Tox A/B, PCR: NOT DETECTED
Campylobacter, PCR: NOT DETECTED
Cryptosporidium, PCR: NOT DETECTED
E coli (ETEC) LT/ST PCR: NOT DETECTED
E coli (STEC) stx1/stx2, PCR: NOT DETECTED
E coli 0157, PCR: NOT DETECTED
Giardia lamblia, PCR: NOT DETECTED
Norovirus, PCR: NOT DETECTED
Rotavirus A, PCR: NOT DETECTED
Salmonella, PCR: NOT DETECTED
Shigella, PCR: NOT DETECTED

## 2018-10-04 ENCOUNTER — Other Ambulatory Visit: Payer: Self-pay | Admitting: Primary Care

## 2018-10-04 DIAGNOSIS — A09 Infectious gastroenteritis and colitis, unspecified: Secondary | ICD-10-CM

## 2018-10-04 MED ORDER — METRONIDAZOLE 500 MG PO TABS: 500.0000 mg | ORAL_TABLET | Freq: Three times a day (TID) | ORAL | 0 refills | Status: AC

## 2018-10-04 MED ORDER — CIPROFLOXACIN HCL 500 MG PO TABS: 500.0000 mg | ORAL_TABLET | Freq: Two times a day (BID) | ORAL | 0 refills | Status: DC

## 2018-10-05 ENCOUNTER — Telehealth: Payer: Self-pay | Admitting: *Deleted

## 2018-10-05 NOTE — Telephone Encounter (Signed)
Spoke to pt who states she was to complete a stool sample, which she has completed but wanted to advise [facility] that she is now having bloody stools. She is not actively bleeding and has only noticed it when having BMs. pls advise

## 2018-10-05 NOTE — Telephone Encounter (Signed)
Please see result note. Vallarie Mare, will you call patient? I sent her a my chart message, not sure if she's reviewed. I sent in two antibiotics yesterday to her pharmacy. Make sure she picks these up. Have her also follow up with me in one week for re-evaluation of symptoms.

## 2018-10-05 NOTE — Telephone Encounter (Signed)
Spoken and notified patient of [name] comments. Patient stated that she did not know there was antibiotics at the pharmacy. Patient stated that it is a lot of bloody stool and increase from the weekend. Patient insist on coming in for appointment to evaluate the increase in bloody stool. Appointment has been scheduled on 10/06/2018

## 2018-10-05 NOTE — Telephone Encounter (Signed)
Noted  

## 2018-10-06 ENCOUNTER — Other Ambulatory Visit: Payer: Self-pay

## 2018-10-06 ENCOUNTER — Encounter: Payer: Self-pay | Admitting: Gastroenterology

## 2018-10-06 ENCOUNTER — Ambulatory Visit (INDEPENDENT_AMBULATORY_CARE_PROVIDER_SITE_OTHER): Payer: Medicare Other | Admitting: Gastroenterology

## 2018-10-06 ENCOUNTER — Encounter: Payer: Self-pay | Admitting: Unknown Physician Specialty

## 2018-10-06 ENCOUNTER — Ambulatory Visit (INDEPENDENT_AMBULATORY_CARE_PROVIDER_SITE_OTHER): Payer: Medicare Other | Admitting: Primary Care

## 2018-10-06 ENCOUNTER — Ambulatory Visit: Payer: Medicare Other | Admitting: Gastroenterology

## 2018-10-06 ENCOUNTER — Encounter: Payer: Self-pay | Admitting: Primary Care

## 2018-10-06 VITALS — BP 110/67 | HR 78 | Ht 58.5 in | Wt 139.6 lb

## 2018-10-06 VITALS — BP 116/80 | HR 78 | Temp 98.0°F | Ht 58.5 in | Wt 139.5 lb

## 2018-10-06 DIAGNOSIS — Z791 Long term (current) use of non-steroidal anti-inflammatories (NSAID): Secondary | ICD-10-CM | POA: Diagnosis not present

## 2018-10-06 DIAGNOSIS — K921 Melena: Secondary | ICD-10-CM

## 2018-10-06 DIAGNOSIS — R197 Diarrhea, unspecified: Secondary | ICD-10-CM

## 2018-10-06 DIAGNOSIS — K625 Hemorrhage of anus and rectum: Secondary | ICD-10-CM

## 2018-10-06 LAB — CBC WITH DIFFERENTIAL/PLATELET
Basophils Absolute: 0.1 10*3/uL (ref 0.0–0.1)
Basophils Relative: 0.6 % (ref 0.0–3.0)
Eosinophils Absolute: 0.1 10*3/uL (ref 0.0–0.7)
Eosinophils Relative: 0.8 % (ref 0.0–5.0)
HCT: 36.9 % (ref 36.0–46.0)
Hemoglobin: 12.4 g/dL (ref 12.0–15.0)
Lymphocytes Relative: 11.7 % — ABNORMAL LOW (ref 12.0–46.0)
Lymphs Abs: 1.1 10*3/uL (ref 0.7–4.0)
MCHC: 33.4 g/dL (ref 30.0–36.0)
MCV: 89.9 fl (ref 78.0–100.0)
Monocytes Absolute: 1.2 10*3/uL — ABNORMAL HIGH (ref 0.1–1.0)
Monocytes Relative: 13.6 % — ABNORMAL HIGH (ref 3.0–12.0)
Neutro Abs: 6.6 10*3/uL (ref 1.4–7.7)
Neutrophils Relative %: 73.3 % (ref 43.0–77.0)
Platelets: 406 10*3/uL — ABNORMAL HIGH (ref 150.0–400.0)
RBC: 4.11 Mil/uL (ref 3.87–5.11)
RDW: 14.7 % (ref 11.5–15.5)
WBC: 9 10*3/uL (ref 4.0–10.5)

## 2018-10-06 LAB — POC HEMOCCULT BLD/STL (OFFICE/1-CARD/DIAGNOSTIC): Fecal Occult Blood, POC: POSITIVE — AB

## 2018-10-06 MED ORDER — FAMOTIDINE 40 MG PO TABS: 40.0000 mg | ORAL_TABLET | Freq: Every day | ORAL | 0 refills | Status: DC

## 2018-10-06 NOTE — Progress Notes (Signed)
[name] MD, MRCP(U.K) [address]  [place]  [place], [place] [address]  Main: [phone]  Fax: [phone]   Primary Care Physician: [name], NP  Primary Gastroenterologist:  Dr. [name]   Chief Complaint  Patient presents with  . Diarrhea    Blood in stool    HPI: [name] is a 83 y.o. female    She has been asked to see me urgently for acute diarrhea.  She was seen by her primary care on 09/29/2018 for acute diarrhea.  At that time it has been going on for 2 months.  Contacted her primary care doctor today and since she was having blood in the stools was asked to come and see [place]. Looking back into her notes she was seen in 2018 by [facility] GI and appears that in December thousand and 9 she had a colonoscopy as per the last GI note showed chronic colitis with focal cryptitis.  Focal active colitis.  After that visit I do not see any follow-up visit being scheduled.  Not sure why it was not felt that this patient had inflammatory bowel disease.  Further follow-up was scheduled.   Diarrhea :  Onset: 2 months back-getting worse -    Number of bowel movements a day : yesterday had 4 BM's , lot of gas and dark blood , no abdominal pain . This morning had 3 BM's which were bloody. Took some imodium . some cramping relieved after a bowel movement .   Consistency:  Pieces  Present status: ongoing    Shape of stool:     Weight loss:  12 lbs  Prior colonoscopy:  2009 per epic. Artificial sugars/sodas/chewing gum: no    Bloating:  No   Gas:  Yes  Antibiotic use: yes -Commenced on Ciprofloxacin, flagyl   Been on Meloxicam for about 2-3  Months, 2 tablets a day and no other NSAID's  09/30/2018: GI PCR negative, 09/29/2018 hemoglobin 12.5 g with a white cell count of 15.3.   No diarrhea prior to this . Blood in her stool ongoing for more than a week.     Current Outpatient Medications  Medication Sig Dispense Refill  . albuterol  (PROVENTIL HFA;VENTOLIN HFA) 108 (90 Base) MCG/ACT inhaler Inhale into the lungs.    . ciprofloxacin (CIPRO) 500 MG tablet Take 1 tablet (500 mg total) by mouth 2 (two) times daily. 20 tablet 0  . citalopram (CELEXA) 20 MG tablet     . Cyanocobalamin (B-12) 2500 MCG TABS Take 2,500 mcg by mouth daily.    Marland Kitchen denosumab (PROLIA) 60 MG/ML SOSY injection Inject 60 mg into the skin every 6 (six) months.    . ESBRIET 267 MG TABS Take 6 tablets by mouth daily.     Marland Kitchen estradiol (ESTRACE VAGINAL) 0.1 MG/GM vaginal cream Place 1 Applicatorful vaginally once a week. (1gram weekly) 42.5 g 2  . HYDROmorphone (DILAUDID) 2 MG tablet Take 2 mg by mouth 2 (two) times daily.     . metroNIDAZOLE (FLAGYL) 500 MG tablet Take 1 tablet (500 mg total) by mouth 3 (three) times daily for 10 days. Avoid alcohol. 30 tablet 0  . nystatin ointment (MYCOSTATIN) Apply 1 application topically 2 (two) times daily. 30 g 0  . OXYGEN Inhale 2 L into the lungs as needed.    . VOLTAREN 1 % GEL   2   No current facility-administered medications for this visit.     Allergies as of  10/06/2018  . (No Known Allergies)    ROS:  General: Negative for anorexia, weight loss, fever, chills, fatigue, weakness. ENT: Negative for hoarseness, difficulty swallowing , nasal congestion. CV: Negative for chest pain, angina, palpitations, dyspnea on exertion, peripheral edema.  Respiratory: Negative for dyspnea at rest, dyspnea on exertion, cough, sputum, wheezing.  GI: See history of present illness. GU:  Negative for dysuria, hematuria, urinary incontinence, urinary frequency, nocturnal urination.  Endo: Negative for unusual weight change.    Physical Examination:   BP 110/67   Pulse 78   Ht 4' 10.5" (1.486 m)   Wt 139 lb 9.6 oz (63.3 kg)   BMI 28.68 kg/m   General: Well-nourished, well-developed in no acute distress.  Eyes: No icterus. Conjunctivae pink. Mouth: Oropharyngeal mucosa moist and pink , no lesions erythema or  exudate. Lungs: Clear to auscultation bilaterally. Non-labored. Heart: Regular rate and rhythm, no murmurs rubs or gallops.  Abdomen: Bowel sounds are normal, nontender, nondistended, no hepatosplenomegaly or masses, no abdominal bruits or hernia , no rebound or guarding.   Extremities: No lower extremity edema. No clubbing or deformities. Neuro: Alert and oriented x 3.  Grossly intact. Skin: Warm and dry, no jaundice.   Psych: Alert and cooperative, normal mood and affect.   Imaging Studies: Dg Abd 2 Views  Result Date: 09/16/2018 CLINICAL DATA:  Pt states loss of appetite, weakness and fatigue for 2-3 weeks, diarrhea for 1-2 weeks, lost 8 lbs in 2 months. GB removed, pt has a pessary in her vagina for about 8-10 years EXAM: ABDOMEN - 2 VIEW COMPARISON:  12/10/2013 FINDINGS: There is no bowel dilation to suggest obstruction. No evidence of adynamic ileus. No free air. No renal or ureteral stones. Soft tissues are unremarkable other than scattered vascular calcifications. Skeletal structures are demineralized. Vertebroplasty has been performed at T12, L1 and L4, T12 new since the prior exam. IMPRESSION: 1. No acute findings.  No evidence of bowel obstruction or free air. Electronically Signed   By: Lajean Manes M.D.   On: 09/16/2018 18:37    Assessment and Plan:   [name] is a 83 y.o. y/o female for diarrhea ongoing for over 2 months.  GI PCR has been negative on 09/30/2018.  Hemoglobin stable.  Asked to come and see me urgently today for blood in the stools.  Looking back in her records in 2018 she was seen by [facility] clinic GI for diarrhea.  In their note they had mentioned a prior colonoscopy in 2009 showed areas of chronic colitis and focal active colitis which is very suggestive of inflammatory bowel disease but was only noted in the cecum .  Unclear why there was no further follow-up for the biopsy results.Appears she has done well since , last 3 months been on Meloxicam daily for  arthritis pains, last 2 months diarrhea with rectal bleeding and cramping last 1 week.   Differentials are NSAID induced colitis vs IBD  Plan 1. Stop All NSAID's 2. IF CBC checked today shows a significant drop in HB then will need to go to ER and get inpatient evalution. If Hb stable will plan for EGD+colonoscopy on Friday  3. Commence on Pepcid.   I have discussed alternative options, risks & benefits,  which include, but are not limited to, bleeding, infection, perforation,respiratory complication & drug reaction.  The patient agrees with this plan & written consent will be obtained.      Dr [name]  MD,MRCP [facility]) Follow up in 10  days

## 2018-10-06 NOTE — Patient Instructions (Signed)
Go to see the GI doctor today as scheduled.  Please go to the hospital if you continue to notice a lot of bleeding and feel weak.   Stop by the lab prior to leaving today. I will notify you of your results once received.   It was a pleasure to see you today!

## 2018-10-06 NOTE — Assessment & Plan Note (Signed)
Ongoing for 2 months, recent work-up suspicious for infectious cause. Now given the presence of rectal bleeding she will require further evaluation. We will continue her Cipro and Flagyl for now, cause for infection is uncertain. Given rectal bleeding coupled with weakness and infection we will send to GI for stat evaluation this afternoon. She may need additional imaging including CT versus colonoscopy.   Repeat CBC pending given recent rectal bleeding. She will have her husband drive her to her GI appointment later this afternoon.

## 2018-10-06 NOTE — Progress Notes (Signed)
Subjective:    Patient ID: [name], female    DOB: 1936/06/28, 83 y.o.   MRN: [id number]  HPI  [name] is an 83 year old female who presents today with a chief complaint of rectal bleeding.  She was initially evaluated on 09/29/18 as a new patient who endorsed diarrhea over the prior 2 months, decrease in appetite. Symptoms occurred within 30 minutes after eating. She had been on antibiotics prior to symptoms beginning which was about 2 months ago. We completed work up which showed leukocytosis with left shift, stool studies without acute process, no anemia. She does have a history of diverticulosis so given her leukocytosis we sent in ciprofloxacin and metronidazole antibiotics for potential infectious cause.  Since her last visit she's noticed dark red rectal bleeding that is mostly evident in the toilet bowl. This occurred yesterday and has been consistent since. She took an "anti-diarrheal" medication at 4 am today, has had no diarrhea since.  She can feel the diarrhea "pooling" in her abdomen. She picked up her antibiotics yesterday and took her first doses this morning. She denies abdominal pain, fevers, vomiting.  She is starting to feel weak.  Wt Readings from Last 3 Encounters:  10/06/18 139 lb 8 oz (63.3 kg)  09/29/18 144 lb (65.3 kg)  09/16/18 144 lb 9.6 oz (65.6 kg)      Review of Systems  Constitutional: Positive for appetite change and fatigue. Negative for chills and fever.  Respiratory: Negative for shortness of breath.   Cardiovascular: Negative for chest pain.  Gastrointestinal: Positive for blood in stool and diarrhea. Negative for abdominal pain, nausea and vomiting.       Past Medical History:  Diagnosis Date  . Altered mental state   . Anxiety   . BMI 30.0-30.9,adult   . Cellulitis   . Chest pain, atypical   . Compression fracture of L4 lumbar vertebra   . Constipation   . Cystitis   . Cystocele   . Diverticulosis   . Fatigue   .  Fibrosis, idiopathic pulmonary ([place])   . Gastroenteritis   . History of back surgery   . History of herniated intervertebral disc   . HTN (hypertension)   . Hypercholesteremia   . Hypocalcemia   . Incontinence of urine   . OA (osteoarthritis)    shoulder and back  . Obesity   . Osteopenia   . Pneumonia   . Shortness of breath   . Sleep apnea   . Thrush   . Vitamin D deficiency      Social History   Socioeconomic History  . Marital status: Married    Spouse name: [name]  . Number of children: 2  . Years of education: Not on file  . Highest education level: Not on file  Occupational History  . Not on file  Social Needs  . Financial resource strain: Not on file  . Food insecurity:    Worry: Not on file    Inability: Not on file  . Transportation needs:    Medical: Not on file    Non-medical: Not on file  Tobacco Use  . Smoking status: Never Smoker  . Smokeless tobacco: Never Used  Substance and Sexual Activity  . Alcohol use: No  . Drug use: No  . Sexual activity: Never    Birth control/protection: None  Lifestyle  . Physical activity:    Days per week: Not on file    Minutes per session: Not on  file  . Stress: Not on file  Relationships  . Social connections:    Talks on phone: Not on file    Gets together: Not on file    Attends religious service: Not on file    Active member of club or organization: Not on file    Attends meetings of clubs or organizations: Not on file    Relationship status: Not on file  . Intimate partner violence:    Fear of current or ex partner: Not on file    Emotionally abused: Not on file    Physically abused: Not on file    Forced sexual activity: Not on file  Other Topics Concern  . Not on file  Social History Narrative  . Not on file    Past Surgical History:  Procedure Laterality Date  . CATARACT EXTRACTION W/PHACO Left 04/24/2016   Procedure: CATARACT EXTRACTION PHACO AND INTRAOCULAR LENS PLACEMENT (IOC);  Surgeon:  Leandrew Koyanagi, MD;  Location: [place];  Service: Ophthalmology;  Laterality: Left;  . CATARACT EXTRACTION W/PHACO Right 07/14/2017   Procedure: CATARACT EXTRACTION PHACO AND INTRAOCULAR LENS PLACEMENT ([place])  RIGHT;  Surgeon: Leandrew Koyanagi, MD;  Location: [place];  Service: Ophthalmology;  Laterality: Right;  . ROTATOR CUFF REPAIR Bilateral    left-12/2009; right-03/2009 dr.califf  . TOTAL SHOULDER REPLACEMENT Left     Family History  Problem Relation Age of Onset  . COPD Mother   . Cancer Brother        colon    No Known Allergies  Current Outpatient Medications on File Prior to Visit  Medication Sig Dispense Refill  . albuterol (PROVENTIL HFA;VENTOLIN HFA) 108 (90 Base) MCG/ACT inhaler Inhale into the lungs.    . ciprofloxacin (CIPRO) 500 MG tablet Take 1 tablet (500 mg total) by mouth 2 (two) times daily. 20 tablet 0  . citalopram (CELEXA) 20 MG tablet     . Cyanocobalamin (B-12) 2500 MCG TABS Take 2,500 mcg by mouth daily.    Marland Kitchen denosumab (PROLIA) 60 MG/ML SOSY injection Inject 60 mg into the skin every 6 (six) months.    . ESBRIET 267 MG TABS Take 6 tablets by mouth daily.     Marland Kitchen estradiol (ESTRACE VAGINAL) 0.1 MG/GM vaginal cream Place 1 Applicatorful vaginally once a week. (1gram weekly) 42.5 g 2  . HYDROmorphone (DILAUDID) 2 MG tablet Take 2 mg by mouth 2 (two) times daily.     . metroNIDAZOLE (FLAGYL) 500 MG tablet Take 1 tablet (500 mg total) by mouth 3 (three) times daily for 10 days. Avoid alcohol. 30 tablet 0  . nystatin ointment (MYCOSTATIN) Apply 1 application topically 2 (two) times daily. 30 g 0  . OXYGEN Inhale 2 L into the lungs as needed.    . VOLTAREN 1 % GEL   2   No current facility-administered medications on file prior to visit.     BP 116/80   Pulse 78   Temp 98 F (36.7 C) (Oral)   Ht 4' 10.5" (1.486 m)   Wt 139 lb 8 oz (63.3 kg)   SpO2 98%   BMI 28.66 kg/m    Objective:   Physical Exam  Constitutional: She  appears well-nourished.  Cardiovascular: Normal rate and regular rhythm.  Respiratory: Effort normal.  GI: Soft. Bowel sounds are normal. There is no abdominal tenderness.  Genitourinary: Rectum:     Guaiac result positive.     No rectal mass, anal fissure, tenderness, external hemorrhoid, internal hemorrhoid or abnormal  anal tone.   Skin: Skin is warm and dry.           Assessment & Plan:

## 2018-10-07 ENCOUNTER — Emergency Department: Payer: Medicare Other

## 2018-10-07 ENCOUNTER — Other Ambulatory Visit: Payer: Self-pay

## 2018-10-07 ENCOUNTER — Emergency Department
Admission: EM | Admit: 2018-10-07 | Discharge: 2018-10-07 | Disposition: A | Discharge status: Home / Self Care | Payer: Medicare Other | Attending: Emergency Medicine | Admitting: Emergency Medicine

## 2018-10-07 DIAGNOSIS — Z79899 Other long term (current) drug therapy: Secondary | ICD-10-CM | POA: Diagnosis not present

## 2018-10-07 DIAGNOSIS — I10 Essential (primary) hypertension: Secondary | ICD-10-CM | POA: Insufficient documentation

## 2018-10-07 DIAGNOSIS — R531 Weakness: Secondary | ICD-10-CM | POA: Diagnosis not present

## 2018-10-07 DIAGNOSIS — R9431 Abnormal electrocardiogram [ECG] [EKG]: Secondary | ICD-10-CM | POA: Diagnosis not present

## 2018-10-07 DIAGNOSIS — E860 Dehydration: Secondary | ICD-10-CM | POA: Insufficient documentation

## 2018-10-07 DIAGNOSIS — R0602 Shortness of breath: Secondary | ICD-10-CM | POA: Diagnosis not present

## 2018-10-07 DIAGNOSIS — R638 Other symptoms and signs concerning food and fluid intake: Secondary | ICD-10-CM | POA: Diagnosis present

## 2018-10-07 DIAGNOSIS — R630 Anorexia: Secondary | ICD-10-CM

## 2018-10-07 LAB — BASIC METABOLIC PANEL
Anion gap: 9 (ref 5–15)
BUN: 18 mg/dL (ref 8–23)
CO2: 27 mmol/L (ref 22–32)
Calcium: 9.2 mg/dL (ref 8.9–10.3)
Chloride: 105 mmol/L (ref 98–111)
Creatinine, Ser: 1.08 mg/dL — ABNORMAL HIGH (ref 0.44–1.00)
GFR calc Af Amer: 56 mL/min — ABNORMAL LOW (ref >60)
GFR calc non Af Amer: 48 mL/min — ABNORMAL LOW (ref >60)
Glucose, Bld: 107 mg/dL — ABNORMAL HIGH (ref 70–99)
Potassium: 3.8 mmol/L (ref 3.5–5.1)
Sodium: 141 mmol/L (ref 135–145)

## 2018-10-07 LAB — CBC
HCT: 40.3 % (ref 36.0–46.0)
Hemoglobin: 12.6 g/dL (ref 12.0–15.0)
MCH: 28.7 pg (ref 26.0–34.0)
MCHC: 31.3 g/dL (ref 30.0–36.0)
MCV: 91.8 fL (ref 80.0–100.0)
Platelets: 418 10*3/uL — ABNORMAL HIGH (ref 150–400)
RBC: 4.39 MIL/uL (ref 3.87–5.11)
RDW: 14.2 % (ref 11.5–15.5)
WBC: 9.1 10*3/uL (ref 4.0–10.5)
nRBC: 0 % (ref 0.0–0.2)

## 2018-10-07 LAB — TROPONIN I: Troponin I: <0.03 ng/mL (ref <0.03)

## 2018-10-07 MED ORDER — SUCRALFATE 1 G PO TABS: 1.0000 g | ORAL_TABLET | Freq: Four times a day (QID) | ORAL | 0 refills | Status: DC

## 2018-10-07 MED ORDER — ONDANSETRON 4 MG PO TBDP: 4.0000 mg | ORAL_TABLET | Freq: Three times a day (TID) | ORAL | 0 refills | Status: DC | PRN

## 2018-10-07 MED ORDER — ALUM & MAG HYDROXIDE-SIMETH 200-200-20 MG/5ML PO SUSP
30.0000 mL | Freq: Once | ORAL | Status: AC
  Administered 2018-10-07: 30 mL via ORAL
  Filled 2018-10-07: qty 30

## 2018-10-07 MED ORDER — ONDANSETRON HCL 4 MG/2ML IJ SOLN
4.0000 mg | Freq: Once | INTRAMUSCULAR | Status: AC
  Administered 2018-10-07: 4 mg via INTRAVENOUS
  Filled 2018-10-07: qty 2

## 2018-10-07 MED ORDER — DICYCLOMINE HCL 10 MG PO CAPS: 20.0000 mg | ORAL_CAPSULE | Freq: Once | ORAL | Status: DC

## 2018-10-07 MED ORDER — SODIUM CHLORIDE 0.9% FLUSH
3.0000 mL | Freq: Once | INTRAVENOUS | Status: AC
  Administered 2018-10-07: 3 mL via INTRAVENOUS

## 2018-10-07 MED ORDER — LIDOCAINE VISCOUS HCL 2 % MT SOLN
15.0000 mL | Freq: Once | OROMUCOSAL | Status: AC
  Administered 2018-10-07: 15 mL via ORAL
  Filled 2018-10-07: qty 15

## 2018-10-07 MED ORDER — SODIUM CHLORIDE 0.9 % IV BOLUS
1000.0000 mL | Freq: Once | INTRAVENOUS | Status: AC
  Administered 2018-10-07: 1000 mL via INTRAVENOUS

## 2018-10-07 NOTE — ED Notes (Signed)
Pt given food at provider request ,to po challenge .

## 2018-10-07 NOTE — ED Provider Notes (Signed)
[name] Emergency Department Provider Note   ____________________________________________   I have reviewed the triage vital signs and the nursing notes.   HISTORY  Chief Complaint Dehydration  History limited by: Not Limited   HPI [name] is a 83 y.o. female who presents to the emergency department today because of concern for possible dehydration. Patient states that for the past couple of months she has had decreased appetite and thus decreased oral intake. In addition she has been having increased stooling. States that any time she takes anything in orally she starts having rectal output. Has not had any emesis. Denies any difficulty with swallowing.    Per medical record review patient has a history of recent GI visit.  Past Medical History:  Diagnosis Date  . Altered mental state   . Anxiety   . BMI 30.0-30.9,adult   . Cellulitis   . Chest pain, atypical   . Compression fracture of L4 lumbar vertebra   . Constipation   . Cystitis   . Cystocele   . Diverticulosis   . Fatigue   . 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 Gastroenteritis   . History of back surgery   . History of herniated intervertebral disc   . HTN (hypertension)   . Hypercholesteremia   . Hypocalcemia   . Incontinence of urine   . OA (osteoarthritis)    shoulder and back  . Obesity   . Osteopenia   . Pneumonia   . Shortness of breath   . Sleep apnea   . Thrush   . Vitamin D deficiency     Patient Active Problem List   Diagnosis Date Noted  . Rectal bleeding 10/06/2018  . Acute diarrhea 09/29/2018  . Fatigue 09/29/2018  . Vitamin D deficiency 03/23/2018  . 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 Tinea corporis 04/26/2016  . Family history of colon cancer requiring screening colonoscopy 04/26/2016  . Slow transit constipation 04/26/2016  . Candidiasis 01/17/2016  . Hypercholesteremia 04/24/2015  . Mild cognitive impairment 04/24/2015  . Compression  fracture of L4 lumbar vertebra 04/24/2015  . Vaginal pessary in situ 04/19/2015  . Cystocele with uterine descensus 03/30/2015  . Anxiety 02/02/2015  . Arthritis 02/02/2015  . Hypertension 02/02/2015  . Sleep apnea 02/02/2015  . Postinflammatory pulmonary fibrosis ([place]) 05/04/2014    Past Surgical History:  Procedure Laterality Date  . CATARACT EXTRACTION W/PHACO Left 04/24/2016   Procedure: CATARACT EXTRACTION PHACO AND INTRAOCULAR LENS PLACEMENT (IOC);  Surgeon: Leandrew Koyanagi, MD;  Location: [place];  Service: Ophthalmology;  Laterality: Left;  . CATARACT EXTRACTION W/PHACO Right 07/14/2017   Procedure: CATARACT EXTRACTION PHACO AND INTRAOCULAR LENS PLACEMENT ([place])  RIGHT;  Surgeon: Leandrew Koyanagi, MD;  Location: [place];  Service: Ophthalmology;  Laterality: Right;  . ROTATOR CUFF REPAIR Bilateral    left-12/2009; right-03/2009 dr.califf  . TOTAL SHOULDER REPLACEMENT Left     Prior to Admission medications   Medication Sig Start Date End Date Taking? Authorizing Provider  albuterol (PROVENTIL HFA;VENTOLIN HFA) 108 (90 Base) MCG/ACT inhaler Inhale into the lungs. 06/24/18 06/24/19  [provider]  ciprofloxacin (CIPRO) 500 MG tablet Take 1 tablet (500 mg total) by mouth 2 (two) times daily. 10/04/18   [name], NP  citalopram (CELEXA) 20 MG tablet  07/31/18   [provider]  Cyanocobalamin (B-12) 2500 MCG TABS Take 2,500 mcg by mouth daily.    [provider]  denosumab (PROLIA) 60 MG/ML SOSY injection Inject 60 mg into  the skin every 6 (six) months.    [provider]  ESBRIET 267 MG TABS Take 6 tablets by mouth daily.  02/25/17   [provider]  estradiol (ESTRACE VAGINAL) 0.1 MG/GM vaginal cream Place 1 Applicatorful vaginally once a week. (1gram weekly) 06/16/18   Rubie Maid, MD  famotidine (PEPCID) 40 MG tablet Take 1 tablet (40 mg total) by mouth daily. 10/06/18 12/05/18  [name], MD   HYDROmorphone (DILAUDID) 2 MG tablet Take 2 mg by mouth 2 (two) times daily.     [provider]  metroNIDAZOLE (FLAGYL) 500 MG tablet Take 1 tablet (500 mg total) by mouth 3 (three) times daily for 10 days. Avoid alcohol. 10/04/18 10/14/18  [name], NP  nystatin ointment (MYCOSTATIN) Apply 1 application topically 2 (two) times daily. 08/29/17   Rubie Maid, MD  OXYGEN Inhale 2 L into the lungs as needed.    [provider]  VOLTAREN 1 % GEL  12/23/17   [provider]    Allergies Patient has no known allergies.  Family History  Problem Relation Age of Onset  . COPD Mother   . Cancer Brother        colon    Social History Social History   Tobacco Use  . Smoking status: Never Smoker  . Smokeless tobacco: Never Used  Substance Use Topics  . Alcohol use: No  . Drug use: No    Review of Systems Constitutional: No fever/chills Eyes: No visual changes. ENT: No sore throat. Cardiovascular: Denies chest pain. Respiratory: Denies shortness of breath. Gastrointestinal: Positive for abdominal pain. Decreased appetite.  Genitourinary: Negative for dysuria. Musculoskeletal: Negative for back pain. Skin: Negative for rash. Neurological: Negative for headaches, focal weakness or numbness.  ____________________________________________   PHYSICAL EXAM:  VITAL SIGNS: ED Triage Vitals  Enc Vitals Group     BP 10/07/18 1614 (!) 130/108     Pulse Rate 10/07/18 1614 90     Resp 10/07/18 1614 18     Temp 10/07/18 1614 98.3 F (36.8 C)     Temp Source 10/07/18 1614 Oral     SpO2 10/07/18 1614 96 %     Weight 10/07/18 1615 144 lb (65.3 kg)     Height 10/07/18 1615 5' (1.524 m)     Head Circumference --      Peak Flow --      Pain Score 10/07/18 1615 5   Constitutional: Alert and oriented.  Eyes: Conjunctivae are normal.  ENT      Head: Normocephalic and atraumatic.      Nose: No congestion/rhinnorhea.      Mouth/Throat: Mucous membranes  are moist.      Neck: No stridor. Hematological/Lymphatic/Immunilogical: No cervical lymphadenopathy. Cardiovascular: Normal rate, regular rhythm.  No murmurs, rubs, or gallops.  Respiratory: Normal respiratory effort without tachypnea nor retractions. Breath sounds are clear and equal bilaterally. No wheezes/rales/rhonchi. Gastrointestinal: Soft and non tender. No rebound. No guarding.  Genitourinary: Deferred Musculoskeletal: Normal range of motion in all extremities. No lower extremity edema. Neurologic:  Normal speech and language. No gross focal neurologic deficits are appreciated.  Skin:  Skin is warm, dry and intact. No rash noted. Psychiatric: Mood and affect are normal. Speech and behavior are normal. Patient exhibits appropriate insight and judgment.  ____________________________________________    LABS (pertinent positives/negatives)  Trop <0.03 CBC wbc 9.1, hgb 12.6, plt 418 BMP wnl except glu 107, cr 1.08  ____________________________________________   EKG  I, Nance Pear, attending  physician, personally viewed and interpreted this EKG  EKG Time: 1615 Rate: 89 Rhythm: sinus rhythm with short pr Axis: left axis deviation Intervals: qtc 384 QRS: narrow, q waves v1 ST changes: no st elevation Impression: abnormal ekg  ____________________________________________    RADIOLOGY  CXR Chronic disease, no acute findings  ____________________________________________   PROCEDURES  Procedures  ____________________________________________   INITIAL IMPRESSION / ASSESSMENT AND PLAN / ED COURSE  Pertinent labs & imaging results that were available during my care of the patient were reviewed by me and considered in my medical decision making (see chart for details).   Patient presented to the emergency department today because of concerns.  Dehydration and decreased appetite.  Patient's blood work does show a slight increase in her creatinine over her  baseline.  She was given IV fluids and did feel better.  In terms of her abdominal issues it sounds like she is not having difficulty with swallowing and was able to keep down some applesauce and a little bit of cracker here.  She did state that she has decreased appetite.  This does appear to be a somewhat chronic issue that has been getting worse over the couple of months.  Discussed with patient she would benefit from continued GI follow-up.  This point I do think is reasonable for patient to continue outpatient work-up.  ____________________________________________   FINAL CLINICAL IMPRESSION(S) / ED DIAGNOSES  Final diagnoses:  Dehydration  Decreased appetite     Note: This dictation was prepared with Dragon dictation. Any transcriptional errors that result from this process are unintentional     Nance Pear, MD 10/07/18 2021

## 2018-10-07 NOTE — Telephone Encounter (Signed)
Hi Kiran,  I received this message from our patient just recently.  Seems like she's unable to tolerate the metronidazole. Given our suspicion for colitis would it be reasonable to switch her to Augmentin? Would you continue Cipro? Any other thoughts regarding her message. I'm on the verge of sending her to the emergency department.   I appreciate your help! Anda Kraft

## 2018-10-07 NOTE — Discharge Instructions (Addendum)
Please seek medical attention for any high fevers, chest pain, shortness of breath, change in behavior, persistent vomiting, bloody stool or any other new or concerning symptoms.  

## 2018-10-07 NOTE — Telephone Encounter (Signed)
Noted and appreciate the consultation and advice. Agree that she needs inpatient management.

## 2018-10-07 NOTE — ED Triage Notes (Addendum)
Pt comes via POV from home with c/o possible dehydration, possible colonoscopy and weakness.  Pt states this all started about the 1st of Jan. Pt states she can only take sips of water and broth.  Pt states mid abdominal pain that started last night. Pt also states SOB.

## 2018-10-08 ENCOUNTER — Telehealth: Payer: Self-pay

## 2018-10-08 ENCOUNTER — Other Ambulatory Visit: Payer: Self-pay

## 2018-10-08 ENCOUNTER — Ambulatory Visit: Payer: Medicare Other | Admitting: Gastroenterology

## 2018-10-08 NOTE — Telephone Encounter (Signed)
Spoke with pt regarding her ED visit and her concerns that she won't be able to drink the bowel prep for her colonoscopy procedure. Pt states she's only able to drink sips of water she struggles to drink other fluids. I relayed this information to Dr. Vicente Males who then gave instructions for pt to have a Flexible Sigmoidoscopy instead of the colonoscopy. I informed the pt, pt agrees. I then explained to pt that we will need her pulmonary clearance which we will obtain after her visit with her pulmonologist scheduled on 10-09-18.

## 2018-10-09 DIAGNOSIS — J849 Interstitial pulmonary disease, unspecified: Secondary | ICD-10-CM | POA: Diagnosis not present

## 2018-10-09 DIAGNOSIS — Z01818 Encounter for other preprocedural examination: Secondary | ICD-10-CM | POA: Diagnosis not present

## 2018-10-09 DIAGNOSIS — R0609 Other forms of dyspnea: Secondary | ICD-10-CM | POA: Diagnosis not present

## 2018-10-12 ENCOUNTER — Encounter: Payer: Self-pay | Admitting: *Deleted

## 2018-10-13 ENCOUNTER — Ambulatory Visit: Payer: Medicare Other | Admitting: Anesthesiology

## 2018-10-13 ENCOUNTER — Ambulatory Visit
Admission: RE | Admit: 2018-10-13 | Discharge: 2018-10-13 | Disposition: A | Discharge status: Home / Self Care | Payer: Medicare Other | Attending: Gastroenterology | Admitting: Gastroenterology

## 2018-10-13 ENCOUNTER — Other Ambulatory Visit: Payer: Self-pay

## 2018-10-13 ENCOUNTER — Encounter
Admission: RE | Disposition: A | Discharge status: Home / Self Care | Payer: Self-pay | Source: Home / Self Care | Attending: Gastroenterology

## 2018-10-13 ENCOUNTER — Encounter: Payer: Self-pay | Admitting: Student

## 2018-10-13 DIAGNOSIS — G4730 Sleep apnea, unspecified: Secondary | ICD-10-CM | POA: Insufficient documentation

## 2018-10-13 DIAGNOSIS — Z96612 Presence of left artificial shoulder joint: Secondary | ICD-10-CM | POA: Insufficient documentation

## 2018-10-13 DIAGNOSIS — K625 Hemorrhage of anus and rectum: Secondary | ICD-10-CM | POA: Insufficient documentation

## 2018-10-13 DIAGNOSIS — Z791 Long term (current) use of non-steroidal anti-inflammatories (NSAID): Secondary | ICD-10-CM | POA: Insufficient documentation

## 2018-10-13 DIAGNOSIS — Z79891 Long term (current) use of opiate analgesic: Secondary | ICD-10-CM | POA: Insufficient documentation

## 2018-10-13 DIAGNOSIS — K5790 Diverticulosis of intestine, part unspecified, without perforation or abscess without bleeding: Secondary | ICD-10-CM | POA: Insufficient documentation

## 2018-10-13 DIAGNOSIS — Z6828 Body mass index (BMI) 28.0-28.9, adult: Secondary | ICD-10-CM | POA: Diagnosis not present

## 2018-10-13 DIAGNOSIS — K633 Ulcer of intestine: Secondary | ICD-10-CM | POA: Diagnosis not present

## 2018-10-13 DIAGNOSIS — R197 Diarrhea, unspecified: Secondary | ICD-10-CM | POA: Diagnosis not present

## 2018-10-13 DIAGNOSIS — Z79890 Hormone replacement therapy (postmenopausal): Secondary | ICD-10-CM | POA: Diagnosis not present

## 2018-10-13 DIAGNOSIS — E669 Obesity, unspecified: Secondary | ICD-10-CM | POA: Diagnosis not present

## 2018-10-13 DIAGNOSIS — Z800 Family history of malignant neoplasm of digestive organs: Secondary | ICD-10-CM | POA: Insufficient documentation

## 2018-10-13 DIAGNOSIS — M479 Spondylosis, unspecified: Secondary | ICD-10-CM | POA: Diagnosis not present

## 2018-10-13 DIAGNOSIS — Z9981 Dependence on supplemental oxygen: Secondary | ICD-10-CM | POA: Insufficient documentation

## 2018-10-13 DIAGNOSIS — K529 Noninfective gastroenteritis and colitis, unspecified: Secondary | ICD-10-CM | POA: Insufficient documentation

## 2018-10-13 DIAGNOSIS — Z9842 Cataract extraction status, left eye: Secondary | ICD-10-CM | POA: Diagnosis not present

## 2018-10-13 DIAGNOSIS — J440 Chronic obstructive pulmonary disease with acute lower respiratory infection: Secondary | ICD-10-CM | POA: Diagnosis not present

## 2018-10-13 DIAGNOSIS — Z961 Presence of intraocular lens: Secondary | ICD-10-CM | POA: Diagnosis not present

## 2018-10-13 DIAGNOSIS — I252 Old myocardial infarction: Secondary | ICD-10-CM | POA: Insufficient documentation

## 2018-10-13 DIAGNOSIS — Z79899 Other long term (current) drug therapy: Secondary | ICD-10-CM | POA: Diagnosis not present

## 2018-10-13 DIAGNOSIS — J449 Chronic obstructive pulmonary disease, unspecified: Secondary | ICD-10-CM | POA: Insufficient documentation

## 2018-10-13 DIAGNOSIS — Z9841 Cataract extraction status, right eye: Secondary | ICD-10-CM | POA: Insufficient documentation

## 2018-10-13 DIAGNOSIS — I10 Essential (primary) hypertension: Secondary | ICD-10-CM | POA: Diagnosis not present

## 2018-10-13 DIAGNOSIS — J189 Pneumonia, unspecified organism: Secondary | ICD-10-CM | POA: Diagnosis not present

## 2018-10-13 DIAGNOSIS — F419 Anxiety disorder, unspecified: Secondary | ICD-10-CM | POA: Diagnosis not present

## 2018-10-13 DIAGNOSIS — M8580 Other specified disorders of bone density and structure, unspecified site: Secondary | ICD-10-CM | POA: Diagnosis not present

## 2018-10-13 HISTORY — PX: ESOPHAGOGASTRODUODENOSCOPY (EGD) WITH PROPOFOL: SHX5813

## 2018-10-13 HISTORY — PX: FLEXIBLE SIGMOIDOSCOPY: SHX5431

## 2018-10-13 SURGERY — ESOPHAGOGASTRODUODENOSCOPY (EGD) WITH PROPOFOL
Anesthesia: General

## 2018-10-13 MED ORDER — PROPOFOL 500 MG/50ML IV EMUL
INTRAVENOUS | Status: DC | PRN
  Administered 2018-10-13: 80 ug/kg/min via INTRAVENOUS

## 2018-10-13 MED ORDER — LIDOCAINE HCL (CARDIAC) PF 100 MG/5ML IV SOSY
PREFILLED_SYRINGE | INTRAVENOUS | Status: DC | PRN
  Administered 2018-10-13: 60 mg via INTRATRACHEAL

## 2018-10-13 MED ORDER — PROPOFOL 10 MG/ML IV BOLUS
INTRAVENOUS | Status: DC | PRN
  Administered 2018-10-13: 50 mg via INTRAVENOUS

## 2018-10-13 MED ORDER — SODIUM CHLORIDE 0.9 % IV SOLN
INTRAVENOUS | Status: DC
  Administered 2018-10-13: 1000 mL via INTRAVENOUS

## 2018-10-13 MED ORDER — GLYCOPYRROLATE 0.2 MG/ML IJ SOLN
INTRAMUSCULAR | Status: DC | PRN
  Administered 2018-10-13: 0.1 mg via INTRAVENOUS

## 2018-10-13 MED ORDER — GLYCOPYRROLATE 0.2 MG/ML IJ SOLN
INTRAMUSCULAR | Status: AC
  Filled 2018-10-13: qty 1

## 2018-10-13 MED ORDER — PROPOFOL 10 MG/ML IV BOLUS
INTRAVENOUS | Status: AC
  Filled 2018-10-13: qty 40

## 2018-10-13 NOTE — Anesthesia Preprocedure Evaluation (Signed)
Anesthesia Evaluation  Patient identified by MRN, date of birth, ID band Patient awake    Reviewed: Allergy & Precautions, H&P , NPO status , Patient's Chart, lab work & pertinent test results  History of Anesthesia Complications Negative for: history of anesthetic complications  Airway Mallampati: III  TM Distance: <3 FB Neck ROM: limited    Dental  (+) Chipped, Poor Dentition, Missing   Pulmonary shortness of breath and with exertion, sleep apnea , pneumonia, COPD,  oxygen dependent,           Cardiovascular Exercise Tolerance: Good hypertension, + Past MI       Neuro/Psych PSYCHIATRIC DISORDERS negative neurological ROS     GI/Hepatic negative GI ROS, Neg liver ROS, neg GERD  ,  Endo/Other  negative endocrine ROS  Renal/GU negative Renal ROS  negative genitourinary   Musculoskeletal  (+) Arthritis ,   Abdominal   Peds  Hematology negative hematology ROS (+)   Anesthesia Other Findings Past Medical History: No date: Altered mental state No date: Anxiety No date: BMI 30.0-30.9,adult No date: Cellulitis No date: Chest pain, atypical No date: Compression fracture of L4 lumbar vertebra No date: Constipation No date: Cystitis No date: Cystocele No date: Diverticulosis No date: Fatigue No date: Fibrosis, idiopathic pulmonary ([facility]) No date: Gastroenteritis No date: History of back surgery No date: History of herniated intervertebral disc No date: HTN (hypertension) No date: Hypercholesteremia No date: Hypocalcemia No date: Incontinence of urine No date: OA (osteoarthritis)     Comment:  shoulder and back No date: Obesity No date: Osteopenia No date: Pneumonia No date: Shortness of breath No date: Sleep apnea No date: Thrush No date: Vitamin D deficiency  Past Surgical History: 04/24/2016: CATARACT EXTRACTION W/PHACO; Left     Comment:  Procedure: CATARACT EXTRACTION PHACO AND INTRAOCULAR       LENS PLACEMENT (IOC);  Surgeon: Leandrew Koyanagi, MD;               Location: [place];  Service: Ophthalmology;                Laterality: Left; 07/14/2017: CATARACT EXTRACTION W/PHACO; Right     Comment:  Procedure: CATARACT EXTRACTION PHACO AND INTRAOCULAR               LENS PLACEMENT ([place])  RIGHT;  Surgeon: Leandrew Koyanagi, MD;  Location: [place];  Service:               Ophthalmology;  Laterality: Right; No date: ROTATOR CUFF REPAIR; Bilateral     Comment:  left-12/2009; right-03/2009 dr.califf No date: TOTAL SHOULDER REPLACEMENT; Left     Reproductive/Obstetrics negative OB ROS                             Anesthesia Physical Anesthesia Plan  ASA: IV  Anesthesia Plan: General   Post-op Pain Management:    Induction: Intravenous  PONV Risk Score and Plan: Propofol infusion and TIVA  Airway Management Planned: Natural Airway and Nasal Cannula  Additional Equipment:   Intra-op Plan:   Post-operative Plan:   Informed Consent: I have reviewed the patients History and Physical, chart, labs and discussed the procedure including the risks, benefits and alternatives for the proposed anesthesia with the patient or authorized representative who has indicated his/her understanding and acceptance.     Dental Advisory Given  Plan  Discussed with: Anesthesiologist, CRNA and Surgeon  Anesthesia Plan Comments: (Patient consented for risks of anesthesia including but not limited to:  - adverse reactions to medications - risk of intubation if required - damage to teeth, lips or other oral mucosa - sore throat or hoarseness - Damage to heart, brain, lungs or loss of life  Patient voiced understanding.)        Anesthesia Quick Evaluation

## 2018-10-13 NOTE — H&P (Signed)
[name], MD [address], [place], [place], [place], [address] [address], [place], [place], [place], [address] Phone: [phone]  Fax: [phone]  Primary Care Physician:  [name], NP   Pre-Procedure History & Physical: HPI:  [name] is a 83 y.o. female is here for an endoscopy and sigmoidoscopy   Past Medical History:  Diagnosis Date  . Altered mental state   . Anxiety   . BMI 30.0-30.9,adult   . Cellulitis   . Chest pain, atypical   . Compression fracture of L4 lumbar vertebra   . Constipation   . Cystitis   . Cystocele   . Diverticulosis   . Fatigue   . 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 Gastroenteritis   . History of back surgery   . History of herniated intervertebral disc   . HTN (hypertension)   . Hypercholesteremia   . Hypocalcemia   . Incontinence of urine   . OA (osteoarthritis)    shoulder and back  . Obesity   . Osteopenia   . Pneumonia   . Shortness of breath   . Sleep apnea   . Thrush   . Vitamin D deficiency     Past Surgical History:  Procedure Laterality Date  . CATARACT EXTRACTION W/PHACO Left 04/24/2016   Procedure: CATARACT EXTRACTION PHACO AND INTRAOCULAR LENS PLACEMENT (IOC);  Surgeon: Leandrew Koyanagi, MD;  Location: [place];  Service: Ophthalmology;  Laterality: Left;  . CATARACT EXTRACTION W/PHACO Right 07/14/2017   Procedure: CATARACT EXTRACTION PHACO AND INTRAOCULAR LENS PLACEMENT ([place])  RIGHT;  Surgeon: Leandrew Koyanagi, MD;  Location: [place];  Service: Ophthalmology;  Laterality: Right;  . ROTATOR CUFF REPAIR Bilateral    left-12/2009; right-03/2009 dr.califf  . TOTAL SHOULDER REPLACEMENT Left     Prior to Admission medications   Medication Sig Start Date End Date Taking? Authorizing Provider  albuterol (PROVENTIL HFA;VENTOLIN HFA) 108 (90 Base) MCG/ACT inhaler Inhale into the lungs. 06/24/18 06/24/19 Yes [provider]  ciprofloxacin (CIPRO)  500 MG tablet Take 1 tablet (500 mg total) by mouth 2 (two) times daily. 10/04/18  Yes [name], NP  citalopram (CELEXA) 20 MG tablet  07/31/18  Yes [provider]  Cyanocobalamin (B-12) 2500 MCG TABS Take 2,500 mcg by mouth daily.   Yes [provider]  ESBRIET 267 MG TABS Take 6 tablets by mouth daily.  02/25/17  Yes [provider]  HYDROmorphone (DILAUDID) 2 MG tablet Take 2 mg by mouth 2 (two) times daily.    Yes [provider]  sucralfate (CARAFATE) 1 g tablet Take 1 tablet (1 g total) by mouth 4 (four) times daily. 10/07/18  Yes Nance Pear, MD  denosumab (PROLIA) 60 MG/ML SOSY injection Inject 60 mg into the skin every 6 (six) months.    [provider]  estradiol (ESTRACE VAGINAL) 0.1 MG/GM vaginal cream Place 1 Applicatorful vaginally once a week. (1gram weekly) 06/16/18   Rubie Maid, MD  famotidine (PEPCID) 40 MG tablet Take 1 tablet (40 mg total) by mouth daily. Patient not taking: Reported on 10/13/2018 10/06/18 12/05/18  [name], MD  metroNIDAZOLE (FLAGYL) 500 MG tablet Take 1 tablet (500 mg total) by mouth 3 (three) times daily for 10 days. Avoid alcohol. 10/04/18 10/14/18  [name], NP  nystatin ointment (MYCOSTATIN) Apply 1 application topically 2 (two) times daily. 08/29/17   Rubie Maid, MD  ondansetron (ZOFRAN ODT) 4 MG disintegrating tablet Take 1 tablet (  4 mg total) by mouth every 8 (eight) hours as needed for nausea or vomiting. 10/07/18   Nance Pear, MD  OXYGEN Inhale 2 L into the lungs as needed.    [provider]  VOLTAREN 1 % GEL  12/23/17   [provider]    Allergies as of 10/07/2018  . (No Known Allergies)    Family History  Problem Relation Age of Onset  . COPD Mother   . Cancer Brother        colon    Social History   Socioeconomic History  . Marital status: Married    Spouse name: [name]  . Number of children: 2  . Years of education: Not on file  . Highest  education level: Not on file  Occupational History  . Not on file  Social Needs  . Financial resource strain: Not on file  . Food insecurity:    Worry: Not on file    Inability: Not on file  . Transportation needs:    Medical: Not on file    Non-medical: Not on file  Tobacco Use  . Smoking status: Never Smoker  . Smokeless tobacco: Never Used  Substance and Sexual Activity  . Alcohol use: No  . Drug use: No  . Sexual activity: Never    Birth control/protection: None  Lifestyle  . Physical activity:    Days per week: Not on file    Minutes per session: Not on file  . Stress: Not on file  Relationships  . Social connections:    Talks on phone: Not on file    Gets together: Not on file    Attends religious service: Not on file    Active member of club or organization: Not on file    Attends meetings of clubs or organizations: Not on file    Relationship status: Not on file  . Intimate partner violence:    Fear of current or ex partner: Not on file    Emotionally abused: Not on file    Physically abused: Not on file    Forced sexual activity: Not on file  Other Topics Concern  . Not on file  Social History Narrative  . Not on file    Review of Systems: See HPI, otherwise negative ROS  Physical Exam: There were no vitals taken for this visit. General:   Alert,  pleasant and cooperative in NAD Head:  Normocephalic and atraumatic. Neck:  Supple; no masses or thyromegaly. Lungs:  Clear throughout to auscultation, normal respiratory effort.    Heart:  +S1, +S2, Regular rate and rhythm, No edema. Abdomen:  Soft, nontender and nondistended. Normal bowel sounds, without guarding, and without rebound.   Neurologic:  Alert and  oriented x4;  grossly normal neurologically.  Impression/Plan: [name] is here for an endoscopy and sigmoidoscopy to be performed for  evaluation of diarrhea/rectal bleeding(both have resolved since stopping meloxicam)    Risks, benefits,  limitations, and alternatives regarding endoscopy have been reviewed with the patient.  Questions have been answered.  All parties agreeable.   [name], MD  10/13/2018, 9:28 AM

## 2018-10-13 NOTE — Transfer of Care (Signed)
Immediate Anesthesia Transfer of Care Note  Patient: [name]  Procedure(s) Performed: ESOPHAGOGASTRODUODENOSCOPY (EGD) WITH PROPOFOL (N/A ) FLEXIBLE SIGMOIDOSCOPY (N/A )  Patient Location: Endoscopy Unit  Anesthesia Type:General  Level of Consciousness: drowsy  Airway & Oxygen Therapy: Patient Spontanous Breathing and Patient connected to nasal cannula oxygen  Post-op Assessment: Report given to RN and Post -op Vital signs reviewed and stable  Post vital signs: stable  Last Vitals:  Vitals Value Taken Time  BP 120/64 10/13/2018 10:09 AM  Temp 36.1 C 10/13/2018 10:09 AM  Pulse 67 10/13/2018 10:12 AM  Resp 20 10/13/2018 10:12 AM  SpO2 100 % 10/13/2018 10:12 AM  Vitals shown include unvalidated device data.  Last Pain:  Vitals:   10/13/18 1009  TempSrc: Tympanic  PainSc: Asleep         Complications: No apparent anesthesia complications

## 2018-10-13 NOTE — Op Note (Signed)
[facility] Gastroenterology Patient Name: [name] Procedure Date: 10/13/2018 9:30 AM MRN: [id number] Account #: [id number] Date of Birth: 1936-07-09 Admit Type: Outpatient Age: 83 Room: [facility] ENDO ROOM 1 Gender: Female Note Status: Finalized Procedure:            Upper GI endoscopy Indications:          Recent gastrointestinal bleeding Providers:            [name] MD, MD Referring MD:         [name] (Referring MD) Medicines:            Monitored Anesthesia Care Complications:        No immediate complications. Procedure:            Pre-Anesthesia Assessment:                       - Prior to the procedure, a History and Physical was                        performed, and patient medications, allergies and                        sensitivities were reviewed. The patient's tolerance of                        previous anesthesia was reviewed.                       - The risks and benefits of the procedure and the                        sedation options and risks were discussed with the                        patient. All questions were answered and informed                        consent was obtained.                       - ASA Grade Assessment: II - A patient with mild                        systemic disease.                       After obtaining informed consent, the endoscope was                        passed under direct vision. Throughout the procedure,                        the patient's blood pressure, pulse, and oxygen                        saturations were monitored continuously. The Endoscope                        was introduced through the mouth, and advanced to the  third part of duodenum. The upper GI endoscopy was                        accomplished with ease. The patient tolerated the                        procedure well. Findings:      The esophagus was normal.      The stomach was normal.      The  examined duodenum was normal. Biopsies for histology were taken with       a cold forceps for evaluation of celiac disease.      The cardia and gastric fundus were normal on retroflexion. Impression:           - Normal esophagus.                       - Normal stomach.                       - Normal examined duodenum. Biopsied. Recommendation:       - Await pathology results.                       - Perform a flexible sigmoidoscopy for further                        evaluation of the rectum and sigmoid colon today. Procedure Code(s):    --- Professional ---                       [phone], Esophagogastroduodenoscopy, flexible, transoral;                        with biopsy, single or multiple Diagnosis Code(s):    --- Professional ---                       K92.2, Gastrointestinal hemorrhage, unspecified CPT copyright 2018 American Medical Association. All rights reserved. The codes documented in this report are preliminary and upon coder review may  be revised to meet current compliance requirements. [name], MD [name] MD, MD 10/13/2018 9:49:32 AM This report has been signed electronically. Number of Addenda: 0 Note Initiated On: 10/13/2018 9:30 AM      [facility]

## 2018-10-13 NOTE — Op Note (Signed)
[facility] Gastroenterology Patient Name: [name] Procedure Date: 10/13/2018 9:30 AM MRN: [id number] Account #: [id number] Date of Birth: [birth year] Admit Type: Outpatient Age: 83 Room: [facility] ENDO ROOM 1 Gender: Female Note Status: Finalized Procedure:            Flexible Sigmoidoscopy Indications:          Rectal hemorrhage Providers:            [name] MD, MD Medicines:            Monitored Anesthesia Care Complications:        No immediate complications. Procedure:            Pre-Anesthesia Assessment:                       - Prior to the procedure, a History and Physical was                        performed, and patient medications, allergies and                        sensitivities were reviewed. The patient's tolerance of                        previous anesthesia was reviewed.                       - The risks and benefits of the procedure and the                        sedation options and risks were discussed with the                        patient. All questions were answered and informed                        consent was obtained.                       - ASA Grade Assessment: II - A patient with mild                        systemic disease.                       After obtaining informed consent, the scope was passed                        under direct vision. The Endoscope was introduced                        through the anus and advanced to the the left                        transverse colon. The flexible sigmoidoscopy was                        accomplished with ease. The patient tolerated the                        procedure well. The quality of the bowel preparation  was adequate. Findings:      The perianal and digital rectal examinations were normal.      Discontinuous areas of nonbleeding ulcerated mucosa with no stigmata of       recent bleeding were present in the sigmoid colon, in the descending   colon and in the distal transverse colon. Biopsies were taken with a       cold forceps for histology.      Normal mucosa was found in the rectum. Biopsies were taken with a cold       forceps for histology.      Multiple small-mouthed diverticula were found in the sigmoid colon. Impression:           - Mucosal ulceration. Biopsied.                       - Normal mucosa in the rectum. Biopsied. Recommendation:       - Discharge patient to home (with escort).                       - Resume previous diet.                       - Await pathology results.                       - Return to my office in 1 week.                       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 All rights reserved. The codes documented in this report are preliminary and upon coder review may  be revised to meet current compliance requirements. [name], MD [name] MD, MD 10/13/2018 10:04:39 AM This report has been signed electronically. Number of Addenda: 0 Note Initiated On: 10/13/2018 9:30 AM Total Procedure Duration: 0 hours 10 minutes 23 seconds       [facility]

## 2018-10-13 NOTE — OR Nursing (Signed)
Pt and husband awaiting to speak with Dr. Vicente Males

## 2018-10-13 NOTE — Anesthesia Post-op Follow-up Note (Signed)
Anesthesia QCDR form completed.        

## 2018-10-13 NOTE — Anesthesia Postprocedure Evaluation (Signed)
Anesthesia Post Note  Patient: [name]  Procedure(s) Performed: ESOPHAGOGASTRODUODENOSCOPY (EGD) WITH PROPOFOL (N/A ) FLEXIBLE SIGMOIDOSCOPY (N/A )  Patient location during evaluation: Endoscopy Anesthesia Type: General Level of consciousness: awake and alert Pain management: pain level controlled Vital Signs Assessment: post-procedure vital signs reviewed and stable Respiratory status: spontaneous breathing, nonlabored ventilation, respiratory function stable and patient connected to nasal cannula oxygen Cardiovascular status: blood pressure returned to baseline and stable Postop Assessment: no apparent nausea or vomiting Anesthetic complications: no     Last Vitals:  Vitals:   10/13/18 1019 10/13/18 1029  BP: (!) 135/56 (!) 109/59  Pulse: 62 67  Resp: (!) 24 (!) 21  Temp:    SpO2: 100% 98%    Last Pain:  Vitals:   10/13/18 1029  TempSrc:   PainSc: 0-No pain                 Precious Haws Bernice Mcauliffe

## 2018-10-14 ENCOUNTER — Encounter: Payer: Self-pay | Admitting: Gastroenterology

## 2018-10-14 LAB — SURGICAL PATHOLOGY

## 2018-10-15 LAB — HSV DNA BY PCR (REFERENCE LAB)
HSV 1 DNA: NEGATIVE
HSV 2 DNA: NEGATIVE

## 2018-10-19 ENCOUNTER — Telehealth: Payer: Self-pay

## 2018-10-19 ENCOUNTER — Ambulatory Visit: Payer: Medicare Other | Admitting: Gastroenterology

## 2018-10-19 NOTE — Telephone Encounter (Signed)
-----   Message from [name], MD sent at 10/19/2018 10:02 AM EST ----- Regarding: RE: Question regarding medication Can stop both  ----- Message ----- From: Rushie Chestnut, CMA Sent: 10/16/2018   1:23 PM EST To: [name], MD Subject: Question regarding medication                  Dr. Vicente Males, Ms. Kroner called to ask if she should continue to take the Pepcid along with taking carafate or should she only be taking the Pepcid?

## 2018-10-19 NOTE — Telephone Encounter (Signed)
Called pt to inform her of Dr. Georgeann Oppenheim directions to stop taking both the pepcid and carafate.  Unable to contact, VM full

## 2018-10-21 ENCOUNTER — Encounter: Payer: Medicare Other | Admitting: Obstetrics and Gynecology

## 2018-10-21 LAB — CYTOMEGALOVIRUS (CMV) CULTURE - CMVCUL

## 2018-10-27 ENCOUNTER — Telehealth: Payer: Self-pay

## 2018-10-27 NOTE — Telephone Encounter (Signed)
-----   Message from [name], MD sent at 10/25/2018  1:50 PM EST ----- Sherald Hess inform - biopsies show colitis - enquire how she is doing and will discuss next steps at follow up appt.   C/c [name], NP   Dr [name] MD,MRCP [facility]) Gastroenterology/Hepatology Pager: [phone]

## 2018-10-27 NOTE — Telephone Encounter (Signed)
Spoke with pt and informed her of biopsy results. Pt states her condition has slightly improved.

## 2018-11-05 ENCOUNTER — Other Ambulatory Visit: Payer: Self-pay

## 2018-11-05 ENCOUNTER — Ambulatory Visit (INDEPENDENT_AMBULATORY_CARE_PROVIDER_SITE_OTHER): Payer: Medicare Other | Admitting: Gastroenterology

## 2018-11-05 ENCOUNTER — Encounter: Payer: Self-pay | Admitting: Gastroenterology

## 2018-11-05 VITALS — BP 149/84 | HR 57 | Ht 58.5 in | Wt 138.6 lb

## 2018-11-05 DIAGNOSIS — Z791 Long term (current) use of non-steroidal anti-inflammatories (NSAID): Secondary | ICD-10-CM

## 2018-11-05 DIAGNOSIS — R197 Diarrhea, unspecified: Secondary | ICD-10-CM

## 2018-11-05 DIAGNOSIS — K529 Noninfective gastroenteritis and colitis, unspecified: Secondary | ICD-10-CM | POA: Diagnosis not present

## 2018-11-05 DIAGNOSIS — K625 Hemorrhage of anus and rectum: Secondary | ICD-10-CM

## 2018-11-05 NOTE — Progress Notes (Signed)
[name] MD, MRCP(U.K) [address]  [place]  [place], [place] [address]  Main: [phone]  Fax: [phone]   Primary Care Physician: [name], NP  Primary Gastroenterologist:  Dr. [name]   No chief complaint on file.   HPI: [name] is a 83 y.o. female     Summary of history :  She is here today to see me as a follow up for diarrhea last seen 4 weeks back.  She was seen by her primary care on 09/29/2018 for acute diarrhea.  At that time it has been going on for 2 months.     Looking back into her notes she was seen in 2018 by [facility] clinic GI and appears that in 08/2008 she had a colonoscopy as per the last GI note showed chronic colitis with focal cryptitis.  Focal active colitis.   Not sure why it was not felt that this patient had inflammatory bowel disease.   Been on Meloxicam for about 2-3  Months, 2 tablets a day and no other NSAID's  09/30/2018: GI PCR negative, 09/29/2018 hemoglobin 12.5 g with a white cell count of 15.3.  No diarrhea prior to this . Blood in her stool ongoing for more than a week.   10/13/2018: EGD: normal . Sigmoidoscopy Discontinuous areas of nonbleeding ulcerated mucosa with no stigmata of recent bleeding were present in the sigmoid colon, in thedescending colon and in the distal transverse colon. Biopsies were taken with a cold forceps for histology.Rectum was normal. Pathology report showed chronic active colitis and normal rectal biopsies. HSV and CMV negative.    Interval history   10/06/2018- 11/05/2018  Off all meloxicam , doing great, no symptoms, non bloody regular bowel movements.    Current Outpatient Medications  Medication Sig Dispense Refill  . albuterol (PROVENTIL HFA;VENTOLIN HFA) 108 (90 Base) MCG/ACT inhaler Inhale into the lungs.    . ciprofloxacin (CIPRO) 500 MG tablet Take 1 tablet (500 mg total) by mouth 2 (two) times daily. 20 tablet 0  . citalopram (CELEXA) 20 MG tablet     .  Cyanocobalamin (B-12) 2500 MCG TABS Take 2,500 mcg by mouth daily.    Marland Kitchen denosumab (PROLIA) 60 MG/ML SOSY injection Inject 60 mg into the skin every 6 (six) months.    . ESBRIET 267 MG TABS Take 6 tablets by mouth daily.     Marland Kitchen estradiol (ESTRACE VAGINAL) 0.1 MG/GM vaginal cream Place 1 Applicatorful vaginally once a week. (1gram weekly) 42.5 g 2  . famotidine (PEPCID) 40 MG tablet Take 1 tablet (40 mg total) by mouth daily. (Patient not taking: Reported on 10/13/2018) 60 tablet 0  . HYDROmorphone (DILAUDID) 2 MG tablet Take 2 mg by mouth 2 (two) times daily.     Marland Kitchen nystatin ointment (MYCOSTATIN) Apply 1 application topically 2 (two) times daily. 30 g 0  . ondansetron (ZOFRAN ODT) 4 MG disintegrating tablet Take 1 tablet (4 mg total) by mouth every 8 (eight) hours as needed for nausea or vomiting. 20 tablet 0  . OXYGEN Inhale 2 L into the lungs as needed.    . sucralfate (CARAFATE) 1 g tablet Take 1 tablet (1 g total) by mouth 4 (four) times daily. 60 tablet 0  . VOLTAREN 1 % GEL   2   No current facility-administered medications for this visit.     Allergies as of 11/05/2018  . (No Known Allergies)    ROS:  General: Negative for anorexia, weight loss, fever,  chills, fatigue, weakness. ENT: Negative for hoarseness, difficulty swallowing , nasal congestion. CV: Negative for chest pain, angina, palpitations, dyspnea on exertion, peripheral edema.  Respiratory: Negative for dyspnea at rest, dyspnea on exertion, cough, sputum, wheezing.  GI: See history of present illness. GU:  Negative for dysuria, hematuria, urinary incontinence, urinary frequency, nocturnal urination.  Endo: Negative for unusual weight change.    Physical Examination:   There were no vitals taken for this visit.  General: Well-nourished, well-developed in no acute distress.  Eyes: No icterus. Conjunctivae pink. Mouth: Oropharyngeal mucosa moist and pink , no lesions erythema or exudate. Lungs: Clear to auscultation  bilaterally. Non-labored. Heart: Regular rate and rhythm, no murmurs rubs or gallops.  Abdomen: Bowel sounds are normal, nontender, nondistended, no hepatosplenomegaly or masses, no abdominal bruits or hernia , no rebound or guarding.   Extremities: No lower extremity edema. No clubbing or deformities. Neuro: Alert and oriented x 3.  Grossly intact. Skin: Warm and dry, no jaundice.   Psych: Alert and cooperative, normal mood and affect.   Imaging Studies: Dg Chest 2 View  Result Date: 10/07/2018 CLINICAL DATA:  83 year old female with shortness of breath, decreased p.o. intake. Abdominal pain since last night. EXAM: CHEST - 2 VIEW COMPARISON:  High-resolution chest CT 04/09/2016 and earlier. FINDINGS: Chronically low lung volumes with asymmetric left perihilar and basilar opacity corresponding to fibrotic interstitial lung disease on the 2017 CT. No superimposed pneumothorax, pulmonary edema, pleural effusion or acute pulmonary opacity. Mediastinal contours remain within normal limits. Visualized tracheal air column is within normal limits. Chronic lower thoracic spine compression fracture augmentation and left shoulder arthroplasty. Negative visible bowel gas pattern. IMPRESSION: Chronic lung disease with left lung base fibrosis. No superimposed acute findings are identified. Electronically Signed   By: Genevie Ann M.D.   On: 10/07/2018 16:57    Assessment and Plan:   [name] is a 83 y.o. y/o female here to follow up for diarrhea ongoing for over 2 months.  GI PCR has been negative on 09/30/2018.    Looking back in her records in 2018 she was seen by [facility] clinic GI for diarrhea.  In their note they had mentioned a prior colonoscopy in 2009 showed areas of chronic colitis and focal active colitis which is very suggestive of inflammatory bowel disease but was only noted in the cecum .  Unclear why there was no further follow-up for the biopsy results.Appears she has done well since , last 3  months been on Meloxicam daily for arthritis pains. Recent sigmoidoscopy showed skipped areas of inflammation with rectal sparing on the left side with features suggestive of either NSAID induced colitis vs ICrohns .   She is off all meloxicam and all her symptoms have resolved  Plan 1. Stop All NSAID's 2. CRP,CBC 3. RTC in 3 months and repeat CRP- at that time we can discuss options of doing nothing vs repeat sigmoidoscopy to look for any inflammation. Conservative approach likely more likely in view of age - benefits vs risks of meds  Dr [name]  MD,MRCP [facility]) Follow up in 3 months

## 2018-11-06 ENCOUNTER — Encounter: Payer: Self-pay | Admitting: Gastroenterology

## 2018-11-06 LAB — CBC WITH DIFFERENTIAL/PLATELET
Basophils Absolute: 0.1 10*3/uL (ref 0.0–0.2)
Basos: 1 %
EOS (ABSOLUTE): 0.3 10*3/uL (ref 0.0–0.4)
Eos: 3 %
Hematocrit: 34.4 % (ref 34.0–46.6)
Hemoglobin: 11.9 g/dL (ref 11.1–15.9)
Immature Grans (Abs): 0 10*3/uL (ref 0.0–0.1)
Immature Granulocytes: 0 %
Lymphocytes Absolute: 1.4 10*3/uL (ref 0.7–3.1)
Lymphs: 16 %
MCH: 30.5 pg (ref 26.6–33.0)
MCHC: 34.6 g/dL (ref 31.5–35.7)
MCV: 88 fL (ref 79–97)
Monocytes Absolute: 0.8 10*3/uL (ref 0.1–0.9)
Monocytes: 10 %
Neutrophils Absolute: 5.9 10*3/uL (ref 1.4–7.0)
Neutrophils: 70 %
Platelets: 258 10*3/uL (ref 150–450)
RBC: 3.9 x10E6/uL (ref 3.77–5.28)
RDW: 14.8 % (ref 11.7–15.4)
WBC: 8.4 10*3/uL (ref 3.4–10.8)

## 2018-11-06 LAB — C-REACTIVE PROTEIN: CRP: 2 mg/L (ref 0–10)

## 2018-11-10 ENCOUNTER — Ambulatory Visit (INDEPENDENT_AMBULATORY_CARE_PROVIDER_SITE_OTHER): Payer: Medicare Other | Admitting: Obstetrics and Gynecology

## 2018-11-10 ENCOUNTER — Encounter: Payer: Self-pay | Admitting: Obstetrics and Gynecology

## 2018-11-10 VITALS — BP 125/65 | HR 55 | Ht 58.5 in | Wt 137.7 lb

## 2018-11-10 DIAGNOSIS — Z4689 Encounter for fitting and adjustment of other specified devices: Secondary | ICD-10-CM | POA: Diagnosis not present

## 2018-11-10 DIAGNOSIS — N952 Postmenopausal atrophic vaginitis: Secondary | ICD-10-CM

## 2018-11-10 DIAGNOSIS — N814 Uterovaginal prolapse, unspecified: Secondary | ICD-10-CM | POA: Diagnosis not present

## 2018-11-10 NOTE — Progress Notes (Signed)
Pt is present today for pessary check. Pt stated that she was doing well and the pessary is working well for her.

## 2018-11-10 NOTE — Progress Notes (Signed)
    GYNECOLOGY PROGRESS NOTE  Subjective:    Patient ID: [name], female    DOB: March 14, 1936, 83 y.o.   MRN: [id number]  HPI  Patient is a 83 y.o. G2P2 female who presented today for a pessary check. She reports no vaginal bleeding. She does continue to report a thin vaginal discharge.  States that she has been noting redness of her vulva, likely due to the moisture and use of her urinary pads. She denies pelvic discomfort and difficulty urinating or moving her bowels.  She does note that she has recently had minor back surgery several weeks ago. Notes current back pain is different from her usual chronic back pain. Wonders if she may have a UTI.  ? The following portions of the patient's history were reviewed and updated as appropriate: allergies, current medications, past family history, past medical history, past social history, past surgical history and problem list.  Review of Systems Pertinent items noted in HPI and remainder of comprehensive ROS otherwise negative.   Objective:   Blood pressure 125/65, pulse (!) 55, height 4' 10.5" (1.486 m), weight 137 lb 11.2 oz (62.5 kg).    General appearance: alert and no distress  Abdomen: soft, non-tender. No masses palpable or organomegaly. No suprapubic tenderness.  Pelvis: The patient's Size 3 ring with support pessary was removed, and cleaned. Vulva mildly inflamed bilaterally along labia majora, non-tender, no warmth. Speculum examination revealed mildly atrophic vaginal mucosa and urethra with no lesions. Scant thin gray-green discharge also present, non-malodorous.    Labs:  Results for orders placed or performed in visit on 11/05/18  CBC w/Diff/Platelet  Result Value Ref Range   WBC 8.4 3.4 - 10.8 x10E3/uL   RBC 3.90 3.77 - 5.28 x10E6/uL   Hemoglobin 11.9 11.1 - 15.9 g/dL   Hematocrit 34.4 34.0 - 46.6 %   MCV 88 79 - 97 fL   MCH 30.5 26.6 - 33.0 pg   MCHC 34.6 31.5 - 35.7 g/dL   RDW 14.8 11.7 - 15.4 %   Platelets 258  150 - 450 x10E3/uL   Neutrophils 70 Not Estab. %   Lymphs 16 Not Estab. %   Monocytes 10 Not Estab. %   Eos 3 Not Estab. %   Basos 1 Not Estab. %   Neutrophils Absolute 5.9 1.4 - 7.0 x10E3/uL   Lymphocytes Absolute 1.4 0.7 - 3.1 x10E3/uL   Monocytes Absolute 0.8 0.1 - 0.9 x10E3/uL   EOS (ABSOLUTE) 0.3 0.0 - 0.4 x10E3/uL   Basophils Absolute 0.1 0.0 - 0.2 x10E3/uL   Immature Granulocytes 0 Not Estab. %   Immature Grans (Abs) 0.0 0.0 - 0.1 x10E3/uL  C-reactive protein  Result Value Ref Range   CRP 2 0 - 10 mg/L    Assessment:   Pessary in situ Cystocele Vaginal atrophy  Plan:   - Removed and reinserted pessary today. To f/u in 10-12 weeks for pessary check and annual exam.  - Continue Premarin cream for vaginal atrophy, once weekly.Continue 0.5 mg dosing.    Rubie Maid, MD [facility]

## 2018-12-02 ENCOUNTER — Other Ambulatory Visit: Payer: Self-pay | Admitting: Obstetrics and Gynecology

## 2018-12-11 DIAGNOSIS — M5416 Radiculopathy, lumbar region: Secondary | ICD-10-CM | POA: Diagnosis not present

## 2018-12-23 ENCOUNTER — Other Ambulatory Visit: Payer: Self-pay

## 2018-12-23 ENCOUNTER — Encounter: Payer: Medicare Other | Attending: Internal Medicine | Admitting: Internal Medicine

## 2018-12-23 DIAGNOSIS — L97229 Non-pressure chronic ulcer of left calf with unspecified severity: Secondary | ICD-10-CM | POA: Diagnosis not present

## 2018-12-23 DIAGNOSIS — S81812A Laceration without foreign body, left lower leg, initial encounter: Secondary | ICD-10-CM | POA: Insufficient documentation

## 2018-12-23 DIAGNOSIS — Z809 Family history of malignant neoplasm, unspecified: Secondary | ICD-10-CM | POA: Insufficient documentation

## 2018-12-23 DIAGNOSIS — Z833 Family history of diabetes mellitus: Secondary | ICD-10-CM | POA: Diagnosis not present

## 2018-12-23 DIAGNOSIS — W19XXXA Unspecified fall, initial encounter: Secondary | ICD-10-CM | POA: Diagnosis not present

## 2018-12-23 DIAGNOSIS — G4730 Sleep apnea, unspecified: Secondary | ICD-10-CM | POA: Diagnosis not present

## 2018-12-23 DIAGNOSIS — I872 Venous insufficiency (chronic) (peripheral): Secondary | ICD-10-CM | POA: Diagnosis not present

## 2018-12-23 DIAGNOSIS — G3184 Mild cognitive impairment, so stated: Secondary | ICD-10-CM | POA: Insufficient documentation

## 2018-12-23 DIAGNOSIS — I10 Essential (primary) hypertension: Secondary | ICD-10-CM | POA: Insufficient documentation

## 2018-12-23 DIAGNOSIS — J8410 Pulmonary fibrosis, unspecified: Secondary | ICD-10-CM | POA: Diagnosis not present

## 2018-12-23 NOTE — Progress Notes (Signed)
ZORINA, STAHLEY ([id number]) Visit Report for 12/23/2018 Allergy List Details Patient Name: JULLIANA, BENISH. Date of Service: 12/23/2018 1:45 PM Medical Record Number: [id number] Patient Account Number: [id number] Date of Birth/Sex: 12-Sep-1935 (83 y.o. F) Treating RN: Curtis Sites Primary Care Maurine Mowbray: Vernona Rieger Other Clinician: Referring Ladajah Soltys: Referral, Self Treating Shahed Yeoman/Extender: Maxwell Caul [place] in Treatment: 0 Allergies Active Allergies No Known Allergies Allergy Notes Electronic Signature(s) Signed: 12/23/2018 2:28:39 PM By: Curtis Sites Entered By: Curtis Sites on 12/23/2018 13:56:56 Franko, Marcine Matar ([id number]) -------------------------------------------------------------------------------- Arrival Information Details Patient Name: SKI, NORRED Date of Service: 12/23/2018 1:45 PM Medical Record Number: [id number] Patient Account Number: [id number] Date of Birth/Sex: Jan 24, 1936 (83 y.o. F) Treating RN: Huel Coventry Primary Care Jereme Loren: Vernona Rieger Other Clinician: Referring Masoud Nyce: Referral, Self Treating Elga Santy/Extender: Altamese [place] in Treatment: 0 Visit Information Patient Arrived: Ambulatory Arrival Time: 13:52 Accompanied By: self Transfer Assistance: None Patient Identification Verified: Yes Secondary Verification Process Completed: Yes History Since Last Visit Added or deleted any medications: No Electronic Signature(s) Signed: 12/23/2018 4:31:15 PM By: Elliot Gurney, BSN, RN, CWS, Kim RN, BSN Entered By: Elliot Gurney, BSN, RN, CWS, Kim on 12/23/2018 13:52:59 INIYAH, PINERA ([id number]) -------------------------------------------------------------------------------- Clinic Level of Care Assessment Details Patient Name: ILITHYIA, WILLMON. Date of Service: 12/23/2018 1:45 PM Medical Record Number: [id number] Patient Account Number: [id number] Date of Birth/Sex: October 27, 1935 (83 y.o. F) Treating RN: Huel Coventry Primary Care Cathaleen Korol: Vernona Rieger Other Clinician: Referring Isley Zinni: Referral, Self Treating Alailah Safley/Extender: Altamese [place] in Treatment: 0 Clinic Level of Care Assessment Items TOOL 2 Quantity Score []  - Use when only an EandM is performed on the INITIAL visit 0 ASSESSMENTS - Nursing Assessment / Reassessment []  - General Physical Exam (combine w/ comprehensive assessment (listed just below) when 0 performed on new pt. evals) X- 1 25 Comprehensive Assessment (HX, ROS, Risk Assessments, Wounds Hx, etc.) ASSESSMENTS - Wound and Skin Assessment / Reassessment X - Simple Wound Assessment / Reassessment - one wound 1 5 []  - 0 Complex Wound Assessment / Reassessment - multiple wounds []  - 0 Dermatologic / Skin Assessment (not related to wound area) ASSESSMENTS - Ostomy and/or Continence Assessment and Care []  - Incontinence Assessment and Management 0 []  - 0 Ostomy Care Assessment and Management (repouching, etc.) PROCESS - Coordination of Care X - Simple Patient / Family Education for ongoing care 1 15 []  - 0 Complex (extensive) Patient / Family Education for ongoing care []  - 0 Staff obtains Chiropractor, Records, Test Results / Process Orders []  - 0 Staff telephones HHA, Nursing Homes / Clarify orders / etc []  - 0 Routine Transfer to another Facility (non-emergent condition) []  - 0 Routine Hospital Admission (non-emergent condition) []  - 0 New Admissions / Manufacturing engineer / Ordering NPWT, Apligraf, etc. []  - 0 Emergency Hospital Admission (emergent condition) X- 1 10 Simple Discharge Coordination []  - 0 Complex (extensive) Discharge Coordination PROCESS - Special Needs []  - Pediatric / Minor Patient Management 0 []  - 0 Isolation Patient Management ELYSHA, MUHA ([id number]) []  - 0 Hearing / Language / Visual special needs []  - 0 Assessment of Community assistance (transportation, D/C planning, etc.) []  - 0 Additional assistance /  Altered mentation []  - 0 Support Surface(s) Assessment (bed, cushion, seat, etc.) INTERVENTIONS - Wound Cleansing / Measurement []  - Wound Imaging (photographs - any number of wounds) 0 []  - 0 Wound Tracing (instead of photographs) []  - 0 Simple Wound Measurement - one wound []  - 0 Complex Wound Measurement -  multiple wounds []  - 0 Simple Wound Cleansing - one wound []  - 0 Complex Wound Cleansing - multiple wounds INTERVENTIONS - Wound Dressings []  - Small Wound Dressing one or multiple wounds 0 []  - 0 Medium Wound Dressing one or multiple wounds []  - 0 Large Wound Dressing one or multiple wounds []  - 0 Application of Medications - injection INTERVENTIONS - Miscellaneous []  - External ear exam 0 []  - 0 Specimen Collection (cultures, biopsies, blood, body fluids, etc.) []  - 0 Specimen(s) / Culture(s) sent or taken to Lab for analysis []  - 0 Patient Transfer (multiple staff / Nurse, adult / Similar devices) []  - 0 Simple Staple / Suture removal (25 or less) []  - 0 Complex Staple / Suture removal (26 or more) []  - 0 Hypo / Hyperglycemic Management (close monitor of Blood Glucose) []  - 0 Ankle / Brachial Index (ABI) - do not check if billed separately Has the patient been seen at the hospital within the last three years: Yes Total Score: 55 Level Of Care: New/Established - Level 2 Electronic Signature(s) Signed: 12/23/2018 4:31:15 PM By: Elliot Gurney, BSN, RN, CWS, Kim RN, BSN Entered By: Elliot Gurney, BSN, RN, CWS, Kim on 12/23/2018 14:21:54 [name] ([id number]) -------------------------------------------------------------------------------- Encounter Discharge Information Details Patient Name: KALLIA, SORTO. Date of Service: 12/23/2018 1:45 PM Medical Record Number: [id number] Patient Account Number: [id number] Date of Birth/Sex: 03/31/1936 (83 y.o. F) Treating RN: Huel Coventry Primary Care Tanielle Emigh: Vernona Rieger Other Clinician: Referring Tanaiya Kolarik: Referral,  Self Treating Roxie Gueye/Extender: Altamese [place] in Treatment: 0 Encounter Discharge Information Items Discharge Condition: Stable Ambulatory Status: Ambulatory Discharge Destination: Home Transportation: Private Auto Accompanied By: self Schedule Follow-up Appointment: Yes Clinical Summary of Care: Electronic Signature(s) Signed: 12/23/2018 4:31:15 PM By: Elliot Gurney, BSN, RN, CWS, Kim RN, BSN Entered By: Elliot Gurney, BSN, RN, CWS, Kim on 12/23/2018 14:23:05 [name] ([id number]) -------------------------------------------------------------------------------- Lower Extremity Assessment Details Patient Name: ABELLA, EUCEDA. Date of Service: 12/23/2018 1:45 PM Medical Record Number: [id number] Patient Account Number: [id number] Date of Birth/Sex: August 11, 1936 (83 y.o. F) Treating RN: Curtis Sites Primary Care Preet Mangano: Vernona Rieger Other Clinician: Referring Donatella Walski: Referral, Self Treating Bennett Vanscyoc/Extender: Maxwell Caul [place] in Treatment: 0 Edema Assessment Assessed: [Left: No] [Right: No] Edema: [Left: Yes] [Right: Yes] Electronic Signature(s) Signed: 12/23/2018 2:28:39 PM By: Curtis Sites Entered By: Curtis Sites on 12/23/2018 14:02:37 [name] ([id number]) -------------------------------------------------------------------------------- Multi-Disciplinary Care Plan Details Patient Name: MARYBEL, DANKERT. Date of Service: 12/23/2018 1:45 PM Medical Record Number: [id number] Patient Account Number: [id number] Date of Birth/Sex: [birth year] (83 y.o. F) Treating RN: Huel Coventry Primary Care Nevaeh Korte: Vernona Rieger Other Clinician: Referring Tamella Tuccillo: Referral, Self Treating Vidit Boissonneault/Extender: Altamese [place] in Treatment: 0 Active Inactive Electronic Signature(s) Signed: 12/23/2018 4:31:15 PM By: Elliot Gurney, BSN, RN, CWS, Kim RN, BSN Entered By: Elliot Gurney, BSN, RN, CWS, Kim on 12/23/2018 14:17:41 Cansler, Marcine Matar  ([id number]) -------------------------------------------------------------------------------- Non-Wound Condition Assessment Details Patient Name: MAKAIA, MOTTL. Date of Service: 12/23/2018 1:45 PM Medical Record Number: [id number] Patient Account Number: [id number] Date of Birth/Sex: Jun 09, 1936 (83 y.o. F) Treating RN: Curtis Sites Primary Care Nakhi Choi: Vernona Rieger Other Clinician: Referring Lovell Nuttall: Referral, Self Treating Judah Carchi/Extender: Altamese [place] in Treatment: 0 Non-Wound Condition: Condition: Scar / Keloid Location: Leg Side: Left Photos Periwound Skin Texture Texture Color No Abnormalities Noted: No No Abnormalities Noted: No Scarring: Yes Moisture No Abnormalities Noted: No Notes patient with scar from previous injury on her left lower leg Electronic Signature(s) Signed: 12/23/2018 2:28:39 PM By: Curtis Sites  Entered By: Curtis Sites on 12/23/2018 14:04:38 Minch, Marcine Matar ([id number]) -------------------------------------------------------------------------------- Pain Assessment Details Patient Name: CARLENA, BROMMER. Date of Service: 12/23/2018 1:45 PM Medical Record Number: [id number] Patient Account Number: [id number] Date of Birth/Sex: [birth year] (83 y.o. F) Treating RN: Huel Coventry Primary Care Roney Youtz: Vernona Rieger Other Clinician: Referring Jemuel Laursen: Referral, Self Treating Keylen Uzelac/Extender: Altamese [place] in Treatment: 0 Active Problems Location of Pain Severity and Description of Pain Patient Has Paino No Site Locations Pain Management and Medication Current Pain Management: Electronic Signature(s) Signed: 12/23/2018 4:31:15 PM By: Elliot Gurney, BSN, RN, CWS, Kim RN, BSN Entered By: Elliot Gurney, BSN, RN, CWS, Kim on 12/23/2018 13:53:05 EVYN, DENNEY ([id number]) -------------------------------------------------------------------------------- Patient/Caregiver Education Details Patient Name: NELEAH, PURDUE Date of Service: 12/23/2018 1:45 PM Medical Record Number: [id number] Patient Account Number: [id number] Date of Birth/Gender: 1936-07-13 (83 y.o. F) Treating RN: Huel Coventry Primary Care Physician: Vernona Rieger Other Clinician: Referring Physician: Referral, Self Treating Physician/Extender: Altamese [place] in Treatment: 0 Education Assessment Education Provided To: Patient Education Topics Provided Venous: Handouts: Controlling Swelling with Compression Stockings , Other: ETI Info and measurements given to patient Methods: Demonstration, Explain/Verbal Responses: State content correctly Wound/Skin Impairment: Handouts: Caring for Your Ulcer Methods: Demonstration, Explain/Verbal Responses: State content correctly Electronic Signature(s) Signed: 12/23/2018 4:31:15 PM By: Elliot Gurney, BSN, RN, CWS, Kim RN, BSN Entered By: Elliot Gurney, BSN, RN, CWS, Kim on 12/23/2018 14:22:42 JESSENIA, MARKU ([id number]) -------------------------------------------------------------------------------- Vitals Details Patient Name: [name] Date of Service: 12/23/2018 1:45 PM Medical Record Number: [id number] Patient Account Number: [id number] Date of Birth/Sex: Nov 06, 1935 (83 y.o. F) Treating RN: Huel Coventry Primary Care Lakishia Bourassa: Vernona Rieger Other Clinician: Referring Nashia Remus: Referral, Self Treating Oswin Johal/Extender: Altamese [place] in Treatment: 0 Vital Signs Time Taken: 13:53 Temperature (F): 98.4 Pulse (bpm): 64 Respiratory Rate (breaths/min): 16 Blood Pressure (mmHg): 135/60 Reference Range: 80 - 120 mg / dl Electronic Signature(s) Signed: 12/23/2018 4:31:15 PM By: Elliot Gurney, BSN, RN, CWS, Kim RN, BSN Entered By: Elliot Gurney, BSN, RN, CWS, Kim on 12/23/2018 13:53:53

## 2018-12-23 NOTE — Progress Notes (Signed)
[name] ([id number]) Visit Report for 12/23/2018 Abuse/Suicide Risk Screen Details Patient Name: [name], [name]. Date of Service: 12/23/2018 1:45 PM Medical Record Number: [id number] Patient Account Number: [id number] Date of Birth/Sex: Jul 16, 1936 (83 y.o. F) Treating RN: Curtis Sites Primary Care Weslee Prestage: Vernona Rieger Other Clinician: Referring Albino Bufford: Referral, Self Treating Myya Meenach/Extender: Altamese [place] in Treatment: 0 Abuse/Suicide Risk Screen Items Answer ABUSE/SUICIDE RISK SCREEN: Has anyone close to you tried to hurt or harm you recentlyo No Do you feel uncomfortable with anyone in your familyo No Has anyone forced you do things that you didnot want to doo No Do you have any thoughts of harming yourselfo No Patient displays signs or symptoms of abuse and/or neglect. No Electronic Signature(s) Signed: 12/23/2018 2:28:39 PM By: Curtis Sites Entered By: Curtis Sites on 12/23/2018 13:58:05 Janusz, Marcine Matar ([id number]) -------------------------------------------------------------------------------- Activities of Daily Living Details Patient Name: [name]. Date of Service: 12/23/2018 1:45 PM Medical Record Number: [id number] Patient Account Number: [id number] Date of Birth/Sex: 12/03/1935 (83 y.o. F) Treating RN: Curtis Sites Primary Care Oretha Weismann: Vernona Rieger Other Clinician: Referring Joliet Mallozzi: Referral, Self Treating Shaneil Yazdi/Extender: Altamese [place] in Treatment: 0 Activities of Daily Living Items Answer Activities of Daily Living (Please select one for each item) Drive Automobile Completely Able Take Medications Completely Able Use Telephone Completely Able Care for Appearance Completely Able Use Toilet Completely Able Bath / Shower Completely Able Dress Self Completely Able Feed Self Completely Able Walk Completely Able Get In / Out Bed Completely Able Housework Completely Able Prepare Meals  Completely Able Handle Money Completely Able Shop for Self Completely Able Electronic Signature(s) Signed: 12/23/2018 2:28:39 PM By: Curtis Sites Entered By: Curtis Sites on 12/23/2018 13:58:29 Padovano, Marcine Matar ([id number]) -------------------------------------------------------------------------------- Education Screening Details Patient Name: [name], [name]. Date of Service: 12/23/2018 1:45 PM Medical Record Number: [id number] Patient Account Number: [id number] Date of Birth/Sex: 08/01/1936 (83 y.o. F) Treating RN: Curtis Sites Primary Care Alaijah Gibler: Vernona Rieger Other Clinician: Referring Kateleen Encarnacion: Referral, Self Treating Idaly Verret/Extender: Altamese [place] in Treatment: 0 Primary Learner Assessed: Patient Learning Preferences/Education Level/Primary Language Learning Preference: Explanation, Demonstration Highest Education Level: College or Above Preferred Language: English Cognitive Barrier Language Barrier: No Translator Needed: No Memory Deficit: No Emotional Barrier: No Cultural/Religious Beliefs Affecting Medical Care: No Physical Barrier Impaired Vision: No Impaired Hearing: No Decreased Hand dexterity: No Knowledge/Comprehension Knowledge Level: Medium Comprehension Level: Medium Ability to understand written Medium instructions: Ability to understand verbal Medium instructions: Motivation Anxiety Level: Calm Cooperation: Cooperative Education Importance: Acknowledges Need Interest in Health Problems: Asks Questions Perception: Coherent Willingness to Engage in Self- Medium Management Activities: Readiness to Engage in Self- Medium Management Activities: Electronic Signature(s) Signed: 12/23/2018 2:28:39 PM By: Curtis Sites Entered By: Curtis Sites on 12/23/2018 13:59:04 CALLIE, DESMIDT ([id number]) -------------------------------------------------------------------------------- Fall Risk Assessment Details Patient Name:  [name] Date of Service: 12/23/2018 1:45 PM Medical Record Number: [id number] Patient Account Number: [id number] Date of Birth/Sex: September 11, 1935 (83 y.o. F) Treating RN: Curtis Sites Primary Care Lamine Laton: Vernona Rieger Other Clinician: Referring Avilyn Virtue: Referral, Self Treating Deem Marmol/Extender: Altamese [place] in Treatment: 0 Fall Risk Assessment Items Have you had 2 or more falls in the last 12 monthso 0 No Have you had any fall that resulted in injury in the last 12 monthso 0 Yes FALL RISK ASSESSMENT: History of falling - immediate or within 3 months 0 No Secondary diagnosis 0 No Ambulatory aid None/bed rest/wheelchair/nurse 0 Yes Crutches/cane/walker 0 No Furniture 0 No IV Access/Saline  Lock 0 No Gait/Training Normal/bed rest/immobile 0 No Weak 10 Yes Impaired 0 No Mental Status Oriented to own ability 0 Yes Electronic Signature(s) Signed: 12/23/2018 2:28:39 PM By: Curtis Sites Entered By: Curtis Sites on 12/23/2018 13:59:38 Kock, Marcine Matar ([id number]) -------------------------------------------------------------------------------- Foot Assessment Details Patient Name: [name]. Date of Service: 12/23/2018 1:45 PM Medical Record Number: [id number] Patient Account Number: [id number] Date of Birth/Sex: 18-Dec-1935 (83 y.o. F) Treating RN: Curtis Sites Primary Care Violetta Lavalle: Vernona Rieger Other Clinician: Referring Jhoanna Heyde: Referral, Self Treating Carmaleta Youngers/Extender: Altamese [place] in Treatment: 0 Foot Assessment Items Site Locations + = Sensation present, - = Sensation absent, C = Callus, U = Ulcer R = Redness, W = Warmth, M = Maceration, PU = Pre-ulcerative lesion F = Fissure, S = Swelling, D = Dryness Assessment Right: Left: Other Deformity: No No Prior Foot Ulcer: No No Prior Amputation: No No Charcot Joint: No No Ambulatory Status: Ambulatory Without Help Gait: Steady Electronic Signature(s) Signed:  12/23/2018 2:28:39 PM By: Curtis Sites Entered By: Curtis Sites on 12/23/2018 13:59:56 Bossman, Marcine Matar ([id number]) -------------------------------------------------------------------------------- Nutrition Risk Screening Details Patient Name: [name]. Date of Service: 12/23/2018 1:45 PM Medical Record Number: [id number] Patient Account Number: [id number] Date of Birth/Sex: Feb 08, 1936 (83 y.o. F) Treating RN: Curtis Sites Primary Care Stanly Si: Vernona Rieger Other Clinician: Referring Starkisha Tullis: Referral, Self Treating Sameerah Nachtigal/Extender: Altamese [place] in Treatment: 0 Height (in): Weight (lbs): Body Mass Index (BMI): Nutrition Risk Screening Items Score Screening NUTRITION RISK SCREEN: I have an illness or condition that made me change the kind and/or amount of 2 Yes food I eat I eat fewer than two meals per day 0 No I eat few fruits and vegetables, or milk products 0 No I have three or more drinks of beer, liquor or wine almost every day 0 No I have tooth or mouth problems that make it hard for me to eat 0 No I don't always have enough money to buy the food I need 0 No I eat alone most of the time 0 No I take three or more different prescribed or over-the-counter drugs a day 1 Yes Without wanting to, I have lost or gained 10 pounds in the last six months 0 No I am not always physically able to shop, cook and/or feed myself 0 No Nutrition Protocols Good Risk Protocol Provide education on Moderate Risk Protocol 0 nutrition High Risk Proctocol Risk Level: Moderate Risk Score: 3 Electronic Signature(s) Signed: 12/23/2018 2:28:39 PM By: Curtis Sites Entered By: Curtis Sites on 12/23/2018 14:00:11

## 2018-12-23 NOTE — Progress Notes (Signed)
[name], [name] ([id number]) Visit Report for 12/23/2018 Chief Complaint Document Details Patient Name: [name], [name]. Date of Service: 12/23/2018 1:45 PM Medical Record Number: [id number] Patient Account Number: [id number] Date of Birth/Sex: 1936-02-10 (83 y.o. F) Treating RN: Cornell Barman Primary Care Provider: Alma Friendly Other Clinician: Referring Provider: Referral, Self Treating Provider/Extender: Tito Dine in Treatment: 0 Information Obtained from: Patient Chief Complaint LLE Ulcers 12/23/2018; patient comes in today out of concern for blistering on her left lateral calf at the site of previous injury Electronic Signature(s) Signed: 12/23/2018 3:44:07 PM By: Linton Ham MD Entered By: Linton Ham on 12/23/2018 14:33:41 [place], [name] ([id number]) -------------------------------------------------------------------------------- HPI Details Patient Name: SHAWNA, WEARING. Date of Service: 12/23/2018 1:45 PM Medical Record Number: [id number] Patient Account Number: [id number] Date of Birth/Sex: 11/23/1935 (83 y.o. F) Treating RN: Cornell Barman Primary Care Provider: Alma Friendly Other Clinician: Referring Provider: Referral, Self Treating Provider/Extender: Tito Dine in Treatment: 0 History of Present Illness HPI Description: 04/23/18-She is seen in initial evaluation for multiple skin tears s/p fall on 8/19. She did sustain a closed fracture of the right patella secondary to that fall. She presented to emergency department on 8/19 for treatment. The left leg laceration was unable to be sutured closed, Steri-Strips were applied and she was treated with knee immobilizer for the patella fracture. She continues on Keflex that was initiated on 8/19. The left leg skin tear flap was cleansed and reapproximated, secure with steri strips. She will follow up next week 04/30/18-She is seen in follow-up evaluation for multiple skin tears. She  continues with knee immobilizers to the right lower extremity, has developed lower extremity edema with subsequent weeping; will initiate ace wrap compression. The flap is mostly devitalized, which was somewhat expected; we will change treatment plan and she will follow up next week 05/07/18-She is seen in follow-up evaluation for multiple wounds. The left lateral lower leg skin flap is nonviable/devitalized, now eschar covered. She tolerated debridement fairly well. The right lower extremity skin tear that was new last week has essentially healed. She has responded nicely to Ace wrap compression. We will modify treatment plan and she will follow-up next week 05/14/18 on evaluation today patient actually appears to be doing a little better in regard to her left lower extremity ulcers. With that being said she has been tolerating the dressing changes without complication. Medihoney is what she was switch to dear in the last week's visit. Prior to that we used Iodoflex when she had the eschar present. Nonetheless I believe that the Iodoflex may be the best thing for her at this point in regard to the current wound bed and prevention of biofilm buildup. Nonetheless she seems to be tolerating the dressing changes without complication which is good news. It's really the debridement the calls are the most pain last week she did allow for debridement this week she also agreed to allow me to attempt debridement in order to try to help this wound to heal more quickly. 05/21/18 on evaluation today patient actually appears to be doing much better at this time in regard to her left lateral lower Trinity ulcer. She's been tolerating the dressing changes without complication. Fortunately there does not appear to be any evidence of infection at this time. There is still some Slough noted on the surface of the wound although not as much as previous. She continues to have a lot of discomfort currently. She tells me that  once we put the Iodoflex  on the last time it actually burned from Thursday till Sunday night and then eased off. Nonetheless it does seem to have made a great improvement in general in regard to the patient's wound bed. 05/28/18 patient's wound bed at this point actually show signs of good improvement which is excellent news currently. She has been tolerating the dressing changes without complication. She does have a little bit of slightly hyper granular tissue at this point there's also some necrotic tissue noted on the surface of the wound. There does not appear to be any evidence of infection overall at this time. In general I feel like that she has made good progress in the past two weeks that I have been seeing her. 06/04/18 on evaluation today patient's left lower Trinity ulcer actually appears to be doing very well today. She has great improvement with excellent epithelialization at this time. Overall I feel like she has made great progress even since last time I seen her. There's really no necrotic tissue on the surface of the wound which is also great news her pain appears to be better. 06/18/18 on evaluation today the patient continues to show signs of improvement in regard to her lower extremity ulcer. She's been tolerating the Hydrofera Blue Dressing without complication and the wound bed seems to be doing excellent at this time. Overall I'm very pleased with the progress and were things stand. 07/02/18 on evaluation today patient actually appears to be doing excellent in regard to her left lower Trinity ulcer. This is definitely smaller and is shown signs of good improvement. It is going somewhat slow but nonetheless we have a steady progression of the wound which is great news. Overall very pleased with the progress that has been made. She's having very [name], [name]. ([id number]) little pain at this time. 07/16/18 on evaluation today patient actually appears to be doing very well  in regard to her lower extremity ulcer. She's been tolerating the dressing changes without complication. Fortunately there is no evidence of anything infection wise occurring at this time. She barely is going to require debridement today. 08/06/18 the on evaluation today patient actually appears to be doing much better in regard to her lower Trinity ulcer. In fact this appears to be very close to complete healing. Fortunately there's no sign of infection at this time. The Hydrofera Blue Dressing to continues to stick somewhat to the wound bed unfortunately. 08/20/18 on evaluation today patient actually appears to be doing very well in regard to the left lower extremity ulcer. She has been tolerating the dressing changes without complications. She tolerates this very well. With that being said I do not see any evidence of infection at this time. Overall I think she is making wonderful progress is very close to complete healing. 09/03/17 on evaluation today patient appears to be doing very well in regard to her lower extremity ulcer. She show signs of good improvement which is excellent. When I last evaluated her this was definitely much larger and overall I feel like she is very close to complete healing although there is still a bit of healing left to take place for this to completely resolve. No fevers, chills, nausea, or vomiting noted at this time. 09/10/18 on evaluation today patient actually appears to be doing excellent at this point. In fact her wound appears to be pretty much healed. There still an area in the central portion of the wound with her some dry skin I do not believe there's anything open  underneath this will again the area appears to be fairly fragile I do not want to cause any damage by attempting to remove this. 09/17/18 patient on evaluation today appears to be completely healed at this point. There is no sign of worsening and general and she has no pain at the  site. READMISSION 12/23/2018 Mrs. Rappa is a 83 year old woman we had in the clinic for a prolonged period of time from August 2019 through September 17, 2018. She had multiple wounds on her bilateral lower extremities secondary to falls. The largest of which appears to be of on the upper left lateral calf. I think she also presented at that time with closed fracture of her right patella. By looking at her wound measurements she did well. She was discharged with compression stockings 20/30 although she is not been wearing these by her own admission. Recently she noticed some blisters coming up on the large left calf scar tissue became concerned and is in here to see [place] today. Past medical history; includes an L4 compression fracture secondary to osteoporosis, mild cognitive impairment, sleep apnea, pulmonary fibrosis Electronic Signature(s) Signed: 12/23/2018 3:44:07 PM By: Linton Ham MD Entered By: Linton Ham on 12/23/2018 14:35:22 Mennenga, [name] ([id number]) -------------------------------------------------------------------------------- Physical Exam Details Patient Name: ESTERA, OZIER. Date of Service: 12/23/2018 1:45 PM Medical Record Number: [id number] Patient Account Number: [id number] Date of Birth/Sex: 16-Jan-1936 (83 y.o. F) Treating RN: Cornell Barman Primary Care Provider: Alma Friendly Other Clinician: Referring Provider: Referral, Self Treating Provider/Extender: Tito Dine in Treatment: 0 Constitutional Sitting or standing Blood Pressure is within target range for patient.. Pulse regular and within target range for patient.Marland Kitchen Respirations regular, non-labored and within target range.. Temperature is normal and within the target range for the patient.Marland Kitchen appears in no distress. Respiratory Respiratory effort is easy and symmetric bilaterally. Rate is normal at rest and on room air.. Crackles diffusely left greater than right lung. No  wheezing. Cardiovascular Pedal pulses palpable and strong bilaterally.. Not have much in the way of edema.Marland Kitchen Lymphatic None palpable in the popliteal area bilateral. Integumentary (Hair, Skin) There are some changes suggestive of venous insufficiency. She also has multiple small purpuric-looking lesions especially on the left leg.Marland Kitchen Psychiatric No evidence of depression, anxiety, or agitation. Calm, cooperative, and communicative. Appropriate interactions and affect.. Notes Wound exam; the patient's substantial wound on the left upper lateral calf from her stay in our clinic last time is closed. There are no blisters there are no open wounds that I could identify. We also could not see anything reviewed by our intake nurses. Electronic Signature(s) Signed: 12/23/2018 3:44:07 PM By: Linton Ham MD Entered By: Linton Ham on 12/23/2018 14:37:40 [place], [name] ([id number]) -------------------------------------------------------------------------------- Physician Orders Details Patient Name: PHALLON, HAYDU. Date of Service: 12/23/2018 1:45 PM Medical Record Number: [id number] Patient Account Number: [id number] Date of Birth/Sex: 1936/01/17 (83 y.o. F) Treating RN: Cornell Barman Primary Care Provider: Alma Friendly Other Clinician: Referring Provider: Referral, Self Treating Provider/Extender: Tito Dine in Treatment: 0 Verbal / Phone Orders: No Diagnosis Coding Edema Control o Patient to wear own compression stockings - Wear stockings every day to protect your skin and keep swelling down. Discharge From [facility] Services o Discharge from [place] Signature(s) Signed: 12/23/2018 3:44:07 PM By: Linton Ham MD Signed: 12/23/2018 4:31:15 PM By: Gretta Cool, BSN, RN, CWS, Kim RN, BSN Entered By: Gretta Cool, BSN, RN, CWS, Kim on 12/23/2018 14:18:41 [name], [name]  ([id number]) -------------------------------------------------------------------------------- Problem List Details  Patient Name: MAHALA, ROMMEL. Date of Service: 12/23/2018 1:45 PM Medical Record Number: [id number] Patient Account Number: [id number] Date of Birth/Sex: 04-20-1936 (83 y.o. 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 Date of Service: 12/23/2018 1:45 PM Medical Record Number: [id number] Patient Account Number: [id number] Date of Birth/Sex: August 15, 1936 (83 y.o. F) Treating RN: Cornell Barman Primary Care Provider: Alma Friendly Other Clinician: Referring Provider: Referral, Self Treating Provider/Extender: Tito Dine in Treatment: 0 Subjective Chief Complaint Information obtained from Patient LLE Ulcers 12/23/2018; patient comes in today out of concern for blistering on her left lateral calf at the site of previous injury History of Present Illness (HPI) 04/23/18-She is seen in initial evaluation for multiple skin tears s/p fall on 8/19. She did sustain a closed fracture of the right patella secondary to that fall. She presented to emergency department on 8/19 for treatment. The left leg laceration was unable to be sutured closed, Steri-Strips were applied and she was treated with knee immobilizer for the  patella fracture. She continues on Keflex that was initiated on 8/19. The left leg skin tear flap was cleansed and reapproximated, secure with steri strips. She will follow up next week 04/30/18-She is seen in follow-up evaluation for multiple skin tears. She continues with knee immobilizers to the right lower extremity, has developed lower extremity edema with subsequent weeping; will initiate ace wrap compression. The flap is mostly devitalized, which was somewhat expected; we will change treatment plan and she will follow up next week 05/07/18-She is seen in follow-up evaluation for multiple wounds. The left lateral lower leg skin flap is nonviable/devitalized, now eschar covered. She tolerated debridement fairly well. The right lower extremity skin tear that was new last week has essentially healed. She has responded nicely to Ace wrap compression. We will modify treatment plan and she will follow-up next week 05/14/18 on evaluation today patient actually appears to be doing a little better in regard to her left lower extremity ulcers. With that being said she has been tolerating the dressing changes without complication. Medihoney is what she was switch to dear in the last week's visit. Prior to that we used Iodoflex when she had the eschar present. Nonetheless I believe that the Iodoflex may be the best thing for her at this point in regard to the current wound bed and prevention of biofilm buildup. Nonetheless she seems to be tolerating the dressing changes without complication which is good news. It's really the debridement the calls are the most pain last week she did allow for debridement this week she also agreed to allow me to attempt debridement in order to try to help this wound to heal more quickly. 05/21/18 on evaluation today patient actually appears to be doing much better at this time in regard to her left lateral lower Trinity ulcer. She's been tolerating the dressing changes without  complication. Fortunately there does not appear to be any evidence of infection at this time. There is still some Slough noted on the surface of the wound although not as much as previous.  She continues to have a lot of discomfort currently. She tells me that once we put the Iodoflex on the last time it actually burned from Thursday till Sunday night and then eased off. Nonetheless it does seem to have made a great improvement in general in regard to the patient's wound bed. 05/28/18 patient's wound bed at this point actually show signs of good improvement which is excellent news currently. She has been tolerating the dressing changes without complication. She does have a little bit of slightly hyper granular tissue at this point there's also some necrotic tissue noted on the surface of the wound. There does not appear to be any evidence of infection overall at this time. In general I feel like that she has made good progress in the past two weeks that I have been seeing her. 06/04/18 on evaluation today patient's left lower Trinity ulcer actually appears to be doing very well today. She has great improvement with excellent epithelialization at this time. Overall I feel like she has made great progress even since last time I seen her. There's really no necrotic tissue on the surface of the wound which is also great news her pain appears to be better. 06/18/18 on evaluation today the patient continues to show signs of improvement in regard to her lower extremity ulcer. She's [name], [name] ([id number]) been tolerating the [facility] Dressing without complication and the wound bed seems to be doing excellent at this time. Overall I'm very pleased with the progress and were things stand. 07/02/18 on evaluation today patient actually appears to be doing excellent in regard to her left lower Trinity ulcer. This is definitely smaller and is shown signs of good improvement. It is going somewhat slow  but nonetheless we have a steady progression of the wound which is great news. Overall very pleased with the progress that has been made. She's having very little pain at this time. 07/16/18 on evaluation today patient actually appears to be doing very well in regard to her lower extremity ulcer. She's been tolerating the dressing changes without complication. Fortunately there is no evidence of anything infection wise occurring at this time. She barely is going to require debridement today. 08/06/18 the on evaluation today patient actually appears to be doing much better in regard to her lower Trinity ulcer. In fact this appears to be very close to complete healing. Fortunately there's no sign of infection at this time. The Hydrofera Blue Dressing to continues to stick somewhat to the wound bed unfortunately. 08/20/18 on evaluation today patient actually appears to be doing very well in regard to the left lower extremity ulcer. She has been tolerating the dressing changes without complications. She tolerates this very well. With that being said I do not see any evidence of infection at this time. Overall I think she is making wonderful progress is very close to complete healing. 09/03/17 on evaluation today patient appears to be doing very well in regard to her lower extremity ulcer. She show signs of good improvement which is excellent. When I last evaluated her this was definitely much larger and overall I feel like she is very close to complete healing although there is still a bit of healing left to take place for this to completely resolve. No fevers, chills, nausea, or vomiting noted at this time. 09/10/18 on evaluation today patient actually appears to be doing excellent at this point. In fact her wound appears to be pretty much healed. There still an area in  the central portion of the wound with her some dry skin I do not believe there's anything open underneath this will again the area appears  to be fairly fragile I do not want to cause any damage by attempting to remove this. 09/17/18 patient on evaluation today appears to be completely healed at this point. There is no sign of worsening and general and she has no pain at the site. READMISSION 12/23/2018 Mrs. Crisanti is a 83 year old woman we had in the clinic for a prolonged period of time from August 2019 through September 17, 2018. She had multiple wounds on her bilateral lower extremities secondary to falls. The largest of which appears to be of on the upper left lateral calf. I think she also presented at that time with closed fracture of her right patella. By looking at her wound measurements she did well. She was discharged with compression stockings 20/30 although she is not been wearing these by her own admission. Recently she noticed some blisters coming up on the large left calf scar tissue became concerned and is in here to see [place] today. Past medical history; includes an L4 compression fracture secondary to osteoporosis, mild cognitive impairment, sleep apnea, pulmonary fibrosis Patient History Information obtained from Patient. Allergies No Known Allergies Family History Cancer - Mother, Lung Disease - Siblings, No family history of Diabetes, Heart Disease, Hereditary Spherocytosis, Hypertension, Kidney Disease, Seizures, Stroke, Thyroid Problems, Tuberculosis. Social History ROCKY, RISHEL ([id number]) Never smoker, Marital Status - Married, Alcohol Use - Never, Drug Use - No History, Caffeine Use - Daily. Medical History Eyes Denies history of Cataracts, Glaucoma, Optic Neuritis Ear/Nose/Mouth/Throat Denies history of Chronic sinus problems/congestion, Middle ear problems Hematologic/Lymphatic Denies history of Anemia, Hemophilia, Human Immunodeficiency Virus, Lymphedema, Sickle Cell Disease Respiratory Denies history of Aspiration, Asthma, Chronic Obstructive Pulmonary Disease (COPD), Pneumothorax, Sleep  Apnea, Tuberculosis Cardiovascular Patient has history of Hypertension Denies history of Angina, Arrhythmia, Congestive Heart Failure, Coronary Artery Disease, Deep Vein Thrombosis, Hypotension, Myocardial Infarction, Peripheral Arterial Disease, Peripheral Venous Disease, Phlebitis, Vasculitis Gastrointestinal Patient has history of Colitis Denies history of Cirrhosis , Crohn s, Hepatitis A, Hepatitis B, Hepatitis C Endocrine Denies history of Type I Diabetes, Type II Diabetes Genitourinary Denies history of End Stage Renal Disease Immunological Denies history of Lupus Erythematosus, Raynaud s, Scleroderma Integumentary (Skin) Denies history of History of Burn, History of pressure wounds Musculoskeletal Patient has history of Osteoarthritis Denies history of Gout, Rheumatoid Arthritis, Osteomyelitis Neurologic Denies history of Dementia, Neuropathy, Quadriplegia, Paraplegia, Seizure Disorder Oncologic Denies history of Received Chemotherapy, Received Radiation Psychiatric Denies history of Anorexia/bulimia, Confinement Anxiety Medical And Surgical History Notes Respiratory pulmonary fibrosis, home O2 at night and PRN Gastrointestinal diverticulosis Review of Systems (ROS) Constitutional Symptoms (General Health) Denies complaints or symptoms of Fatigue, Fever, Chills, Marked Weight Change. Ear/Nose/Mouth/Throat Denies complaints or symptoms of Difficult clearing ears, Sinusitis. Hematologic/Lymphatic Denies complaints or symptoms of Bleeding / Clotting Disorders, Human Immunodeficiency Virus. Respiratory Denies complaints or symptoms of Chronic or frequent coughs, Shortness of Breath. Cardiovascular Denies complaints or symptoms of Chest pain, LE edema. Gastrointestinal Denies complaints or symptoms of Frequent diarrhea, Nausea, Vomiting. Endocrine Denies complaints or symptoms of Hepatitis, Thyroid disease, Polydypsia (Excessive Thirst). Genitourinary Denies  complaints or symptoms of Kidney failure/ Dialysis, Incontinence/dribbling. [name], [name] ([id number]) Immunological Denies complaints or symptoms of Hives, Itching. Integumentary (Skin) Denies complaints or symptoms of Wounds, Bleeding or bruising tendency, Breakdown, Swelling. Musculoskeletal Denies complaints or symptoms of Muscle Pain, Muscle Weakness. Neurologic Denies complaints or symptoms  of Numbness/parasthesias, Focal/Weakness. Psychiatric Denies complaints or symptoms of Anxiety, Claustrophobia. Objective Constitutional Sitting or standing Blood Pressure is within target range for patient.. Pulse regular and within target range for patient.Marland Kitchen Respirations regular, non-labored and within target range.. Temperature is normal and within the target range for the patient.Marland Kitchen appears in no distress. Vitals Time Taken: 1:53 PM, Temperature: 98.4 F, Pulse: 64 bpm, Respiratory Rate: 16 breaths/min, Blood Pressure: 135/60 mmHg. Respiratory Respiratory effort is easy and symmetric bilaterally. Rate is normal at rest and on room air.. Crackles diffusely left greater than right lung. No wheezing. Cardiovascular Pedal pulses palpable and strong bilaterally.. Not have much in the way of edema.Marland Kitchen Lymphatic None palpable in the popliteal area bilateral. Psychiatric No evidence of depression, anxiety, or agitation. Calm, cooperative, and communicative. Appropriate interactions and affect.. General Notes: Wound exam; the patient's substantial wound on the left upper lateral calf from her stay in our clinic last time is closed. There are no blisters there are no open wounds that I could identify. We also could not see anything reviewed by our intake nurses. Integumentary (Hair, Skin) There are some changes suggestive of venous insufficiency. She also has multiple small purpuric-looking lesions especially on the left leg.. Assessment [name], [name] ([id number]) Active  Problems ICD-10 Non-pressure chronic ulcer of left calf with other specified severity Plan Edema Control: Patient to wear own compression stockings - Wear stockings every day to protect your skin and keep swelling down. Discharge From [facility] Services: Discharge from [place] 1. The patient does not have any open wounds 2. She describes blisters over the previous scar tissue on the left calf laterally. I did not identify these today however if she is developing blisters it would suggest that she probably needs compression. I talked to her about wearing her compression stockings as the best way to avoid developing more wounds. This would also protect her skin from incidental trauma as well as controlling the edema 3. I do not think she needs to be followed here at this point. She has the stockings at home she simply has not been wearing them Electronic Signature(s) Signed: 12/23/2018 3:44:07 PM By: Linton Ham MD Entered By: Linton Ham on 12/23/2018 14:39:52 Murillo, [name] ([id number]) -------------------------------------------------------------------------------- ROS/PFSH Details Patient Name: [name], [name]. Date of Service: 12/23/2018 1:45 PM Medical Record Number: [id number] Patient Account Number: [id number] Date of Birth/Sex: 1936/08/27 (83 y.o. F) Treating RN: Montey Hora Primary Care Provider: Alma Friendly Other Clinician: Referring Provider: Referral, Self Treating Provider/Extender: Tito Dine in Treatment: 0 Information Obtained From Patient Constitutional Symptoms (General Health) Complaints and Symptoms: Negative for: Fatigue; Fever; Chills; Marked Weight Change Ear/Nose/Mouth/Throat Complaints and Symptoms: Negative for: Difficult clearing ears; Sinusitis Medical History: Negative for: Chronic sinus problems/congestion; Middle ear problems Hematologic/Lymphatic Complaints and Symptoms: Negative for: Bleeding /  Clotting Disorders; Human Immunodeficiency Virus Medical History: Negative for: Anemia; Hemophilia; Human Immunodeficiency Virus; Lymphedema; Sickle Cell Disease Respiratory Complaints and Symptoms: Negative for: Chronic or frequent coughs; Shortness of Breath Medical History: Negative for: Aspiration; Asthma; Chronic Obstructive Pulmonary Disease (COPD); Pneumothorax; Sleep Apnea; Tuberculosis Past Medical History Notes: pulmonary fibrosis, home O2 at night and PRN Cardiovascular Complaints and Symptoms: Negative for: Chest pain; LE edema Medical History: Positive for: Hypertension Negative for: Angina; Arrhythmia; Congestive Heart Failure; Coronary Artery Disease; Deep Vein Thrombosis; Hypotension; Myocardial Infarction; Peripheral Arterial Disease; Peripheral Venous Disease; Phlebitis; Vasculitis Gastrointestinal Complaints and Symptoms: Negative for: Frequent diarrhea; Nausea; Vomiting Medical History: [name], [name] ([id number]) Positive  for: Colitis Negative for: Cirrhosis ; Crohnos; Hepatitis A; Hepatitis B; Hepatitis C Past Medical History Notes: diverticulosis Endocrine Complaints and Symptoms: Negative for: Hepatitis; Thyroid disease; Polydypsia (Excessive Thirst) Medical History: Negative for: Type I Diabetes; Type II Diabetes Genitourinary Complaints and Symptoms: Negative for: Kidney failure/ Dialysis; Incontinence/dribbling Medical History: Negative for: End Stage Renal Disease Immunological Complaints and Symptoms: Negative for: Hives; Itching Medical History: Negative for: Lupus Erythematosus; Raynaudos; Scleroderma Integumentary (Skin) Complaints and Symptoms: Negative for: Wounds; Bleeding or bruising tendency; Breakdown; Swelling Medical History: Negative for: History of Burn; History of pressure wounds Musculoskeletal Complaints and Symptoms: Negative for: Muscle Pain; Muscle Weakness Medical History: Positive for: Osteoarthritis Negative  for: Gout; Rheumatoid Arthritis; Osteomyelitis Neurologic Complaints and Symptoms: Negative for: Numbness/parasthesias; Focal/Weakness Medical History: Negative for: Dementia; Neuropathy; Quadriplegia; Paraplegia; Seizure Disorder Psychiatric Complaints and Symptoms: Negative for: Anxiety; Claustrophobia Medical History: [name], [name] ([id number]) Negative for: Anorexia/bulimia; Confinement Anxiety Eyes Medical History: Negative for: Cataracts; Glaucoma; Optic Neuritis Oncologic Medical History: Negative for: Received Chemotherapy; Received Radiation Immunizations Pneumococcal Vaccine: Received Pneumococcal Vaccination: Yes Implantable Devices None Family and Social History Cancer: Yes - Mother; Diabetes: No; Heart Disease: No; Hereditary Spherocytosis: No; Hypertension: No; Kidney Disease: No; Lung Disease: Yes - Siblings; Seizures: No; Stroke: No; Thyroid Problems: No; Tuberculosis: No; Never smoker; Marital Status - Married; Alcohol Use: Never; Drug Use: No History; Caffeine Use: Daily; Financial Concerns: No; Food, Clothing or Shelter Needs: No; Support System Lacking: No; Transportation Concerns: No Electronic Signature(s) Signed: 12/23/2018 2:28:39 PM By: Montey Hora Signed: 12/23/2018 3:44:07 PM By: Linton Ham MD Entered By: Montey Hora on 12/23/2018 13:57:56 Arentz, [name] ([id number]) -------------------------------------------------------------------------------- [place] Details Patient Name: [name], RIVERE. Date of Service: 12/23/2018 Medical Record Number: [id number] Patient Account Number: [id number] Date of Birth/Sex: 07-Nov-1935 (83 y.o. F) Treating RN: Cornell Barman Primary Care Provider: Alma Friendly Other Clinician: Referring Provider: Referral, Self Treating Provider/Extender: Tito Dine in Treatment: 0 Diagnosis Coding ICD-10 Codes Code Description L97.228 Non-pressure chronic ulcer of left calf with other specified  severity Facility Procedures CPT4 Code: 32355732 Description: [id number] - WOUND CARE VISIT-LEV 2 EST PT Modifier: Quantity: 1 Physician Procedures CPT4 Code: 2706237 Description: 62831 - WC PHYS LEVEL 3 - EST PT ICD-10 Diagnosis Description L97.228 Non-pressure chronic ulcer of left calf with other specified Modifier: severity Quantity: 1 Electronic Signature(s) Signed: 12/23/2018 3:44:07 PM By: Linton Ham MD Entered By: Linton Ham on 12/23/2018 14:40:07

## 2019-02-10 ENCOUNTER — Ambulatory Visit: Payer: Medicare Other | Admitting: Gastroenterology

## 2019-02-10 DIAGNOSIS — M25519 Pain in unspecified shoulder: Secondary | ICD-10-CM | POA: Diagnosis not present

## 2019-02-10 DIAGNOSIS — M5416 Radiculopathy, lumbar region: Secondary | ICD-10-CM | POA: Diagnosis not present

## 2019-02-10 DIAGNOSIS — G894 Chronic pain syndrome: Secondary | ICD-10-CM | POA: Diagnosis not present

## 2019-02-15 DIAGNOSIS — M6281 Muscle weakness (generalized): Secondary | ICD-10-CM | POA: Diagnosis not present

## 2019-02-15 DIAGNOSIS — M25612 Stiffness of left shoulder, not elsewhere classified: Secondary | ICD-10-CM | POA: Diagnosis not present

## 2019-02-15 DIAGNOSIS — M545 Low back pain: Secondary | ICD-10-CM | POA: Diagnosis not present

## 2019-02-15 DIAGNOSIS — M25512 Pain in left shoulder: Secondary | ICD-10-CM | POA: Diagnosis not present

## 2019-02-16 ENCOUNTER — Encounter: Payer: Medicare Other | Admitting: Obstetrics and Gynecology

## 2019-02-17 DIAGNOSIS — M47817 Spondylosis without myelopathy or radiculopathy, lumbosacral region: Secondary | ICD-10-CM | POA: Diagnosis not present

## 2019-02-19 DIAGNOSIS — M25561 Pain in right knee: Secondary | ICD-10-CM | POA: Diagnosis not present

## 2019-02-19 DIAGNOSIS — M25562 Pain in left knee: Secondary | ICD-10-CM | POA: Diagnosis not present

## 2019-02-19 DIAGNOSIS — M7542 Impingement syndrome of left shoulder: Secondary | ICD-10-CM | POA: Diagnosis not present

## 2019-02-22 DIAGNOSIS — M25512 Pain in left shoulder: Secondary | ICD-10-CM | POA: Diagnosis not present

## 2019-02-22 DIAGNOSIS — M6281 Muscle weakness (generalized): Secondary | ICD-10-CM | POA: Diagnosis not present

## 2019-02-22 DIAGNOSIS — M545 Low back pain: Secondary | ICD-10-CM | POA: Diagnosis not present

## 2019-02-22 DIAGNOSIS — M25612 Stiffness of left shoulder, not elsewhere classified: Secondary | ICD-10-CM | POA: Diagnosis not present

## 2019-02-23 ENCOUNTER — Telehealth: Payer: Self-pay

## 2019-02-23 NOTE — Progress Notes (Signed)
Pt present today for pessary care. Pt stated having an odor coming from the vaginal area and sure where it is coming from.

## 2019-02-23 NOTE — Telephone Encounter (Signed)
Pt prescreened no symptoms has face mask.   Coronavirus (COVID-19) Are you at risk?  Are you at risk for the Coronavirus (COVID-19)?  To be considered HIGH RISK for Coronavirus (COVID-19), you have to meet the following criteria:  . Traveled to [place], [place], [place], [place] or [place]; or in the [place] to [place], [place], [place], or [place]; and have fever, cough, and shortness of breath within the last 2 weeks of travel OR . Been in close contact with a person diagnosed with COVID-19 within the last 2 weeks and have fever, cough, and shortness of breath . IF YOU DO NOT MEET THESE CRITERIA, YOU ARE CONSIDERED LOW RISK FOR COVID-19.  What to do if you are HIGH RISK for COVID-19?  Marland Kitchen If you are having a medical emergency, call 911. . Seek medical care right away. Before you go to a doctor's office, urgent care or emergency department, call ahead and tell them about your recent travel, contact with someone diagnosed with COVID-19, and your symptoms. You should receive instructions from your physician's office regarding next steps of care.  . When you arrive at healthcare provider, tell the healthcare staff immediately you have returned from visiting [place], [place], [place], [place] or [place]; or traveled in the [place] to [place], [place], [place], or [place]; in the last two weeks or you have been in close contact with a person diagnosed with COVID-19 in the last 2 weeks.   . Tell the health care staff about your symptoms: fever, cough and shortness of breath. . After you have been seen by a medical provider, you will be either: o Tested for (COVID-19) and discharged home on quarantine except to seek medical care if symptoms worsen, and asked to  - Stay home and avoid contact with others until you get your results (4-5 days)  - Avoid travel on public transportation if possible (such as bus, train, or airplane) or o Sent to the Emergency Department by EMS for  evaluation, COVID-19 testing, and possible admission depending on your condition and test results.  What to do if you are LOW RISK for COVID-19?  Reduce your risk of any infection by using the same precautions used for avoiding the common cold or flu:  Marland Kitchen Wash your hands often with soap and warm water for at least 20 seconds.  If soap and water are not readily available, use an alcohol-based hand sanitizer with at least 60% alcohol.  . If coughing or sneezing, cover your mouth and nose by coughing or sneezing into the elbow areas of your shirt or coat, into a tissue or into your sleeve (not your hands). . Avoid shaking hands with others and consider head nods or verbal greetings only. . Avoid touching your eyes, nose, or mouth with unwashed hands.  . Avoid close contact with people who are sick. . Avoid places or events with large numbers of people in one location, like concerts or sporting events. . Carefully consider travel plans you have or are making. . If you are planning any travel outside or inside the [place], visit the CDC's Travelers' Health webpage for the latest health notices. . If you have some symptoms but not all symptoms, continue to monitor at home and seek medical attention if your symptoms worsen. . If you are having a medical emergency, call 911.   ADDITIONAL HEALTHCARE OPTIONS FOR PATIENTS  [facility] / e-Visit: [email or web address]  [facility]: [facility]: [phone]                   [facility]: [phone]

## 2019-02-24 ENCOUNTER — Encounter: Payer: Self-pay | Admitting: Obstetrics and Gynecology

## 2019-02-24 ENCOUNTER — Ambulatory Visit (INDEPENDENT_AMBULATORY_CARE_PROVIDER_SITE_OTHER): Payer: Medicare Other | Admitting: Obstetrics and Gynecology

## 2019-02-24 ENCOUNTER — Other Ambulatory Visit: Payer: Self-pay

## 2019-02-24 VITALS — BP 110/76 | HR 70 | Ht 58.5 in | Wt 129.3 lb

## 2019-02-24 DIAGNOSIS — Z4689 Encounter for fitting and adjustment of other specified devices: Secondary | ICD-10-CM | POA: Diagnosis not present

## 2019-02-24 DIAGNOSIS — R32 Unspecified urinary incontinence: Secondary | ICD-10-CM | POA: Diagnosis not present

## 2019-02-24 DIAGNOSIS — N8110 Cystocele, unspecified: Secondary | ICD-10-CM

## 2019-02-24 DIAGNOSIS — N952 Postmenopausal atrophic vaginitis: Secondary | ICD-10-CM | POA: Diagnosis not present

## 2019-02-24 DIAGNOSIS — R634 Abnormal weight loss: Secondary | ICD-10-CM | POA: Diagnosis not present

## 2019-02-24 NOTE — Progress Notes (Signed)
    GYNECOLOGY PROGRESS NOTE  Subjective:    Patient ID: [name], female    DOB: [birth year], 83 y.o.   MRN: [id number]  HPI  Patient is a 83 y.o. G2P2 female who presented today for a pessary check. She reports no vaginal bleeding. She does report a thin vaginal discharge but it could be urine. She denies pelvic discomfort and difficulty urinating or moving her bowels.  Notes she stopped using the Premarin cream.  Notes she didn't feel like it was helping.   Patient is a little worried that she continues to lose weight. Notes she had a bout with ulcerative colitis several months ago. Has still been losing weight since then.  ? The following portions of the patient's history were reviewed and updated as appropriate: allergies, current medications, past family history, past medical history, past social history, past surgical history and problem list.  Review of Systems Pertinent items noted in HPI and remainder of comprehensive ROS otherwise negative.   Objective:   Blood pressure 110/76, pulse 70, height 4' 10.5" (1.486 m), weight 129 lb 4.8 oz (58.7 kg).    General appearance: alert and no distress  Abdomen: soft, non-tender. No masses palpable or organomegaly. No suprapubic tenderness.  Pelvis: The patient's Size 3 ring with support pessary was removed, and cleaned. Urethral atrophy and inflammation present.  Speculum examination revealed mildly atrophic vaginal mucosa and urethra with no lesions. Scant thin white discharge also present, non-malodorous.    Labs:   Assessment:   Pessary in situ Cystocele Vaginal atrophy Weight loss Urinary leakage  Plan:   - Removed and reinserted pessary today. To f/u in 10-12 weeks for pessary check and annual exam. Can consider changing to ring with knob for possible urinary leakage.  - Advised to resume Premarin cream weekly for vaginal atrophy. Continue 0.5 mg dosing. - Weight loss, unexplained.  Advised to discuss further with  her PCP or GI physician as she continues to lose weight despite resolution of her acute colitis symptoms.  - RTC in 3 months for annual exam and pessary check.   Rubie Maid, MD [facility]

## 2019-02-25 DIAGNOSIS — M25512 Pain in left shoulder: Secondary | ICD-10-CM | POA: Diagnosis not present

## 2019-02-25 DIAGNOSIS — M6281 Muscle weakness (generalized): Secondary | ICD-10-CM | POA: Diagnosis not present

## 2019-02-25 DIAGNOSIS — M545 Low back pain: Secondary | ICD-10-CM | POA: Diagnosis not present

## 2019-02-25 DIAGNOSIS — M25612 Stiffness of left shoulder, not elsewhere classified: Secondary | ICD-10-CM | POA: Diagnosis not present

## 2019-03-03 DIAGNOSIS — G894 Chronic pain syndrome: Secondary | ICD-10-CM | POA: Diagnosis not present

## 2019-03-03 DIAGNOSIS — M5416 Radiculopathy, lumbar region: Secondary | ICD-10-CM | POA: Diagnosis not present

## 2019-03-03 DIAGNOSIS — M545 Low back pain: Secondary | ICD-10-CM | POA: Diagnosis not present

## 2019-03-03 DIAGNOSIS — M25512 Pain in left shoulder: Secondary | ICD-10-CM | POA: Diagnosis not present

## 2019-03-03 DIAGNOSIS — M25612 Stiffness of left shoulder, not elsewhere classified: Secondary | ICD-10-CM | POA: Diagnosis not present

## 2019-03-03 DIAGNOSIS — M6281 Muscle weakness (generalized): Secondary | ICD-10-CM | POA: Diagnosis not present

## 2019-03-03 DIAGNOSIS — M25519 Pain in unspecified shoulder: Secondary | ICD-10-CM | POA: Diagnosis not present

## 2019-03-09 DIAGNOSIS — M6281 Muscle weakness (generalized): Secondary | ICD-10-CM | POA: Diagnosis not present

## 2019-03-09 DIAGNOSIS — M545 Low back pain: Secondary | ICD-10-CM | POA: Diagnosis not present

## 2019-03-09 DIAGNOSIS — M25612 Stiffness of left shoulder, not elsewhere classified: Secondary | ICD-10-CM | POA: Diagnosis not present

## 2019-03-09 DIAGNOSIS — M25512 Pain in left shoulder: Secondary | ICD-10-CM | POA: Diagnosis not present

## 2019-03-10 ENCOUNTER — Ambulatory Visit: Payer: Medicare Other | Admitting: Gastroenterology

## 2019-03-11 DIAGNOSIS — M545 Low back pain: Secondary | ICD-10-CM | POA: Diagnosis not present

## 2019-03-11 DIAGNOSIS — M6281 Muscle weakness (generalized): Secondary | ICD-10-CM | POA: Diagnosis not present

## 2019-03-11 DIAGNOSIS — M25612 Stiffness of left shoulder, not elsewhere classified: Secondary | ICD-10-CM | POA: Diagnosis not present

## 2019-03-11 DIAGNOSIS — M25512 Pain in left shoulder: Secondary | ICD-10-CM | POA: Diagnosis not present

## 2019-03-12 DIAGNOSIS — Z1159 Encounter for screening for other viral diseases: Secondary | ICD-10-CM | POA: Diagnosis not present

## 2019-03-15 DIAGNOSIS — R0602 Shortness of breath: Secondary | ICD-10-CM | POA: Diagnosis not present

## 2019-03-15 DIAGNOSIS — Z79899 Other long term (current) drug therapy: Secondary | ICD-10-CM | POA: Diagnosis not present

## 2019-03-16 ENCOUNTER — Other Ambulatory Visit: Payer: Self-pay | Admitting: Specialist

## 2019-03-16 DIAGNOSIS — M545 Low back pain: Secondary | ICD-10-CM | POA: Diagnosis not present

## 2019-03-16 DIAGNOSIS — R0602 Shortness of breath: Secondary | ICD-10-CM

## 2019-03-16 DIAGNOSIS — M25612 Stiffness of left shoulder, not elsewhere classified: Secondary | ICD-10-CM | POA: Diagnosis not present

## 2019-03-16 DIAGNOSIS — M6281 Muscle weakness (generalized): Secondary | ICD-10-CM | POA: Diagnosis not present

## 2019-03-16 DIAGNOSIS — M25512 Pain in left shoulder: Secondary | ICD-10-CM | POA: Diagnosis not present

## 2019-03-18 DIAGNOSIS — M6281 Muscle weakness (generalized): Secondary | ICD-10-CM | POA: Diagnosis not present

## 2019-03-18 DIAGNOSIS — M545 Low back pain: Secondary | ICD-10-CM | POA: Diagnosis not present

## 2019-03-18 DIAGNOSIS — M25512 Pain in left shoulder: Secondary | ICD-10-CM | POA: Diagnosis not present

## 2019-03-18 DIAGNOSIS — M25612 Stiffness of left shoulder, not elsewhere classified: Secondary | ICD-10-CM | POA: Diagnosis not present

## 2019-03-23 DIAGNOSIS — M25512 Pain in left shoulder: Secondary | ICD-10-CM | POA: Diagnosis not present

## 2019-03-23 DIAGNOSIS — M545 Low back pain: Secondary | ICD-10-CM | POA: Diagnosis not present

## 2019-03-23 DIAGNOSIS — M6281 Muscle weakness (generalized): Secondary | ICD-10-CM | POA: Diagnosis not present

## 2019-03-23 DIAGNOSIS — M25612 Stiffness of left shoulder, not elsewhere classified: Secondary | ICD-10-CM | POA: Diagnosis not present

## 2019-03-24 ENCOUNTER — Other Ambulatory Visit: Payer: Self-pay

## 2019-03-24 ENCOUNTER — Ambulatory Visit
Admission: RE | Admit: 2019-03-24 | Discharge: 2019-03-24 | Disposition: A | Discharge status: Home / Self Care | Payer: Medicare Other | Source: Ambulatory Visit | Attending: Specialist | Admitting: Specialist

## 2019-03-24 DIAGNOSIS — R0602 Shortness of breath: Secondary | ICD-10-CM

## 2019-03-25 DIAGNOSIS — M6281 Muscle weakness (generalized): Secondary | ICD-10-CM | POA: Diagnosis not present

## 2019-03-25 DIAGNOSIS — M25612 Stiffness of left shoulder, not elsewhere classified: Secondary | ICD-10-CM | POA: Diagnosis not present

## 2019-03-25 DIAGNOSIS — M25512 Pain in left shoulder: Secondary | ICD-10-CM | POA: Diagnosis not present

## 2019-03-25 DIAGNOSIS — M545 Low back pain: Secondary | ICD-10-CM | POA: Diagnosis not present

## 2019-03-29 DIAGNOSIS — M818 Other osteoporosis without current pathological fracture: Secondary | ICD-10-CM | POA: Diagnosis not present

## 2019-04-01 DIAGNOSIS — M6281 Muscle weakness (generalized): Secondary | ICD-10-CM | POA: Diagnosis not present

## 2019-04-01 DIAGNOSIS — M25612 Stiffness of left shoulder, not elsewhere classified: Secondary | ICD-10-CM | POA: Diagnosis not present

## 2019-04-01 DIAGNOSIS — M545 Low back pain: Secondary | ICD-10-CM | POA: Diagnosis not present

## 2019-04-01 DIAGNOSIS — M25512 Pain in left shoulder: Secondary | ICD-10-CM | POA: Diagnosis not present

## 2019-04-06 ENCOUNTER — Ambulatory Visit: Payer: Medicare Other | Admitting: Gastroenterology

## 2019-04-07 DIAGNOSIS — M6281 Muscle weakness (generalized): Secondary | ICD-10-CM | POA: Diagnosis not present

## 2019-04-07 DIAGNOSIS — M25512 Pain in left shoulder: Secondary | ICD-10-CM | POA: Diagnosis not present

## 2019-04-07 DIAGNOSIS — M545 Low back pain: Secondary | ICD-10-CM | POA: Diagnosis not present

## 2019-04-07 DIAGNOSIS — M25612 Stiffness of left shoulder, not elsewhere classified: Secondary | ICD-10-CM | POA: Diagnosis not present

## 2019-04-13 DIAGNOSIS — M545 Low back pain: Secondary | ICD-10-CM | POA: Diagnosis not present

## 2019-04-13 DIAGNOSIS — M6281 Muscle weakness (generalized): Secondary | ICD-10-CM | POA: Diagnosis not present

## 2019-04-13 DIAGNOSIS — M25512 Pain in left shoulder: Secondary | ICD-10-CM | POA: Diagnosis not present

## 2019-04-13 DIAGNOSIS — M25612 Stiffness of left shoulder, not elsewhere classified: Secondary | ICD-10-CM | POA: Diagnosis not present

## 2019-04-14 DIAGNOSIS — M25512 Pain in left shoulder: Secondary | ICD-10-CM | POA: Diagnosis not present

## 2019-04-15 DIAGNOSIS — M25612 Stiffness of left shoulder, not elsewhere classified: Secondary | ICD-10-CM | POA: Diagnosis not present

## 2019-04-15 DIAGNOSIS — M545 Low back pain: Secondary | ICD-10-CM | POA: Diagnosis not present

## 2019-04-15 DIAGNOSIS — M25512 Pain in left shoulder: Secondary | ICD-10-CM | POA: Diagnosis not present

## 2019-04-15 DIAGNOSIS — M6281 Muscle weakness (generalized): Secondary | ICD-10-CM | POA: Diagnosis not present

## 2019-04-20 DIAGNOSIS — M6281 Muscle weakness (generalized): Secondary | ICD-10-CM | POA: Diagnosis not present

## 2019-04-20 DIAGNOSIS — M25612 Stiffness of left shoulder, not elsewhere classified: Secondary | ICD-10-CM | POA: Diagnosis not present

## 2019-04-20 DIAGNOSIS — M545 Low back pain: Secondary | ICD-10-CM | POA: Diagnosis not present

## 2019-04-20 DIAGNOSIS — M25512 Pain in left shoulder: Secondary | ICD-10-CM | POA: Diagnosis not present

## 2019-04-27 DIAGNOSIS — M25512 Pain in left shoulder: Secondary | ICD-10-CM | POA: Diagnosis not present

## 2019-04-27 DIAGNOSIS — M6281 Muscle weakness (generalized): Secondary | ICD-10-CM | POA: Diagnosis not present

## 2019-04-27 DIAGNOSIS — M545 Low back pain: Secondary | ICD-10-CM | POA: Diagnosis not present

## 2019-04-27 DIAGNOSIS — M25612 Stiffness of left shoulder, not elsewhere classified: Secondary | ICD-10-CM | POA: Diagnosis not present

## 2019-04-28 DIAGNOSIS — M5416 Radiculopathy, lumbar region: Secondary | ICD-10-CM | POA: Diagnosis not present

## 2019-04-28 DIAGNOSIS — M47817 Spondylosis without myelopathy or radiculopathy, lumbosacral region: Secondary | ICD-10-CM | POA: Diagnosis not present

## 2019-04-30 DIAGNOSIS — Z23 Encounter for immunization: Secondary | ICD-10-CM | POA: Diagnosis not present

## 2019-05-04 DIAGNOSIS — M545 Low back pain: Secondary | ICD-10-CM | POA: Diagnosis not present

## 2019-05-04 DIAGNOSIS — M6281 Muscle weakness (generalized): Secondary | ICD-10-CM | POA: Diagnosis not present

## 2019-05-04 DIAGNOSIS — M25512 Pain in left shoulder: Secondary | ICD-10-CM | POA: Diagnosis not present

## 2019-05-04 DIAGNOSIS — M25612 Stiffness of left shoulder, not elsewhere classified: Secondary | ICD-10-CM | POA: Diagnosis not present

## 2019-05-06 DIAGNOSIS — M6281 Muscle weakness (generalized): Secondary | ICD-10-CM | POA: Diagnosis not present

## 2019-05-06 DIAGNOSIS — M25512 Pain in left shoulder: Secondary | ICD-10-CM | POA: Diagnosis not present

## 2019-05-06 DIAGNOSIS — M25612 Stiffness of left shoulder, not elsewhere classified: Secondary | ICD-10-CM | POA: Diagnosis not present

## 2019-05-06 DIAGNOSIS — M545 Low back pain: Secondary | ICD-10-CM | POA: Diagnosis not present

## 2019-05-10 ENCOUNTER — Other Ambulatory Visit: Payer: Self-pay

## 2019-05-10 ENCOUNTER — Encounter: Payer: Self-pay | Admitting: Emergency Medicine

## 2019-05-10 ENCOUNTER — Emergency Department
Admission: EM | Admit: 2019-05-10 | Discharge: 2019-05-10 | Disposition: A | Discharge status: Home / Self Care | Payer: Medicare Other | Attending: Emergency Medicine | Admitting: Emergency Medicine

## 2019-05-10 DIAGNOSIS — S81811A Laceration without foreign body, right lower leg, initial encounter: Secondary | ICD-10-CM | POA: Diagnosis not present

## 2019-05-10 DIAGNOSIS — Y9389 Activity, other specified: Secondary | ICD-10-CM | POA: Insufficient documentation

## 2019-05-10 DIAGNOSIS — Y999 Unspecified external cause status: Secondary | ICD-10-CM | POA: Insufficient documentation

## 2019-05-10 DIAGNOSIS — W268XXA Contact with other sharp object(s), not elsewhere classified, initial encounter: Secondary | ICD-10-CM | POA: Diagnosis not present

## 2019-05-10 DIAGNOSIS — I10 Essential (primary) hypertension: Secondary | ICD-10-CM | POA: Diagnosis not present

## 2019-05-10 DIAGNOSIS — Y92094 Garage of other non-institutional residence as the place of occurrence of the external cause: Secondary | ICD-10-CM | POA: Insufficient documentation

## 2019-05-10 NOTE — Discharge Instructions (Signed)
Follow-up with your primary care provider if any continued problems.  Leave the restore dressing on for 7 days.  You may then follow-up with your primary care provider to see if additional dressings are needed.  Elevate your leg often to prevent swelling.  You may take your regular medication as prescribed by your doctor.

## 2019-05-10 NOTE — ED Triage Notes (Signed)
Pt caught leg with nail. No fall. Pt alert and oriented. No bleeding. L shaped laceration to leg.

## 2019-05-10 NOTE — ED Triage Notes (Signed)
FIRST NURSE NOTE-pt cut leg with finger nail. No injury. Bleeding controlled.

## 2019-05-10 NOTE — ED Provider Notes (Signed)
[facility] Emergency Department Provider Note  ____________________________________________   First MD Initiated Contact with Patient 05/10/19 1333     (approximate)  I have reviewed the triage vital signs and the nursing notes.   HISTORY  Chief Complaint Laceration   HPI LUEVENIA MCAVOY is a 83 y.o. female presents to the ED with family stating that she was cleaning the garage when she cut her leg on her fingernail.  She states that she has very thin skin and that this is not unusual.  She is up-to-date on her tetanus.  She denies any other injury.       Past Medical History:  Diagnosis Date  . Altered mental state   . Anxiety   . BMI 30.0-30.9,adult   . Cellulitis   . Chest pain, atypical   . Compression fracture of L4 lumbar vertebra   . Constipation   . Cystitis   . Cystocele   . Diverticulosis   . Fatigue   . Fibrosis, idiopathic pulmonary ([place])   . Gastroenteritis   . History of back surgery   . History of herniated intervertebral disc   . HTN (hypertension)   . Hypercholesteremia   . Hypocalcemia   . Incontinence of urine   . OA (osteoarthritis)    shoulder and back  . Obesity   . Osteopenia   . Pneumonia   . Shortness of breath   . Sleep apnea   . Thrush   . Vitamin D deficiency     Patient Active Problem List   Diagnosis Date Noted  . Rectal bleeding 10/06/2018  . Acute diarrhea 09/29/2018  . Fatigue 09/29/2018  . Vitamin D deficiency 03/23/2018  . 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 Tinea corporis 04/26/2016  . Family history of colon cancer requiring screening colonoscopy 04/26/2016  . Slow transit constipation 04/26/2016  . Candidiasis 01/17/2016  . Hypercholesteremia 04/24/2015  . Mild cognitive impairment 04/24/2015  . Compression fracture of L4 lumbar vertebra 04/24/2015  . Vaginal pessary in situ 04/19/2015  . Cystocele with uterine descensus 03/30/2015  . Anxiety 02/02/2015  . Arthritis 02/02/2015  .  Hypertension 02/02/2015  . Sleep apnea 02/02/2015  . 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 CATARACT EXTRACTION W/PHACO Left 04/24/2016   Procedure: CATARACT EXTRACTION PHACO AND INTRAOCULAR LENS PLACEMENT (IOC);  Surgeon: Leandrew Koyanagi, MD;  Location: [facility];  Service: Ophthalmology;  Laterality: Left;  . CATARACT EXTRACTION W/PHACO Right 07/14/2017   Procedure: CATARACT EXTRACTION PHACO AND INTRAOCULAR LENS PLACEMENT ([place])  RIGHT;  Surgeon: Leandrew Koyanagi, MD;  Location: [facility];  Service: Ophthalmology;  Laterality: Right;  . ESOPHAGOGASTRODUODENOSCOPY (EGD) WITH PROPOFOL N/A 10/13/2018   Procedure: ESOPHAGOGASTRODUODENOSCOPY (EGD) WITH PROPOFOL;  Surgeon: [name], MD;  Location: [facility] ENDOSCOPY;  Service: Gastroenterology;  Laterality: N/A;  . FLEXIBLE SIGMOIDOSCOPY N/A 10/13/2018   Procedure: FLEXIBLE SIGMOIDOSCOPY;  Surgeon: [name], MD;  Location: [facility] ENDOSCOPY;  Service: Gastroenterology;  Laterality: N/A;  . ROTATOR CUFF REPAIR Bilateral    left-12/2009; right-03/2009 dr.califf  . TOTAL SHOULDER REPLACEMENT Left     Prior to Admission medications   Medication Sig Start Date End Date Taking? Authorizing Provider  albuterol (PROVENTIL HFA;VENTOLIN HFA) 108 (90 Base) MCG/ACT inhaler Inhale into the lungs. 06/24/18 06/24/19  [provider]  citalopram (CELEXA) 20 MG tablet  07/31/18   [provider]  Cyanocobalamin (B-12) 2500 MCG TABS Take 2,500 mcg by mouth daily.  [provider]  denosumab (PROLIA) 60 MG/ML SOSY injection Inject 60 mg into the skin every 6 (six) months.    [provider]  ESBRIET 267 MG TABS Take 6 tablets by mouth daily.  02/25/17   [provider]  estradiol (ESTRACE VAGINAL) 0.1 MG/GM vaginal cream Place 1 Applicatorful vaginally once a week. (1gram weekly) 06/16/18   Rubie Maid, MD  HYDROmorphone  (DILAUDID) 2 MG tablet Take 2 mg by mouth 2 (two) times daily.     [provider]  nystatin ointment (MYCOSTATIN) APPLY  OINTMENT TOPICALLY TWICE DAILY 12/02/18   Rubie Maid, MD  OXYGEN Inhale 2 L into the lungs as needed.    [provider]  VOLTAREN 1 % GEL  12/23/17   [provider]    Allergies Patient has no known allergies.  Family History  Problem Relation Age of Onset  . COPD Mother   . Cancer Brother        colon    Social History Social History   Tobacco Use  . Smoking status: Never Smoker  . Smokeless tobacco: Never Used  Substance Use Topics  . Alcohol use: No  . Drug use: No    Review of Systems Constitutional: No fever/chills Cardiovascular: Denies chest pain. Respiratory: Denies shortness of breath. Musculoskeletal: Negative for leg pain. Skin: Positive for laceration. Neurological: Negative for  focal weakness or numbness. ___________________________________________   PHYSICAL EXAM:  VITAL SIGNS: ED Triage Vitals  Enc Vitals Group     BP 05/10/19 1314 (!) 132/55     Pulse Rate 05/10/19 1314 (!) 58     Resp 05/10/19 1314 18     Temp 05/10/19 1314 98.8 F (37.1 C)     Temp Source 05/10/19 1314 Oral     SpO2 05/10/19 1314 96 %     Weight 05/10/19 1312 126 lb (57.2 kg)     Height 05/10/19 1312 5' (1.524 m)     Head Circumference --      Peak Flow --      Pain Score 05/10/19 1312 0     Pain Loc --      Pain Edu? --      Excl. in [place]? --    Constitutional: Alert and oriented. Well appearing and in no acute distress. Eyes: Conjunctivae are normal.  Head: Atraumatic. Neck: No stridor.   Cardiovascular: Normal rate, regular rhythm. Grossly normal heart sounds.  Good peripheral circulation. Respiratory: Normal respiratory effort.  No retractions. Lungs CTAB. Musculoskeletal: Nontender to palpation bony prominence right lower leg. Neurologic:  Normal speech and language. No gross focal neurologic deficits are  appreciated. No gait instability. Skin:  Skin is warm, dry.  There is a laceration noted to the right lateral aspect of the tib-fib without active bleeding.  Area is a flap type laceration.  No foreign body is noted.  Nontender palpation. Psychiatric: Mood and affect are normal. Speech and behavior are normal.  ____________________________________________   LABS (all labs ordered are listed, but only abnormal results are displayed)  Labs Reviewed - No data to display  PROCEDURES  Procedure(s) performed (including Critical Care):  Procedures   ____________________________________________   INITIAL IMPRESSION / ASSESSMENT AND PLAN / ED COURSE  As part of my medical decision making, I reviewed the following data within the electronic MEDICAL RECORD NUMBER Notes from prior ED visits and [place] Controlled Substance Database  83 year old female is brought to the ED after a skin tear to her right lower leg  when her fingernail hit her leg.  Patient states that this is not unusual and happens because she has "thin skin".  Patient is up-to-date on immunizations.  Area was Steri-Stripped and restore dressing was placed.  She and family member were instructed to leave this for the next 7 days and to follow-up with her PCP if any continued problems.  She is instructed to return to the emergency department if any urgent concerns.  ____________________________________________   FINAL CLINICAL IMPRESSION(S) / ED DIAGNOSES  Final diagnoses:  Skin tear of right lower leg without complication, initial encounter     ED Discharge Orders    None       Note:  This document was prepared using Dragon voice recognition software and may include unintentional dictation errors.    Johnn Hai, PA-C 05/10/19 1532    Delman Kitten, MD 05/10/19 1540

## 2019-05-18 ENCOUNTER — Ambulatory Visit (INDEPENDENT_AMBULATORY_CARE_PROVIDER_SITE_OTHER): Payer: Medicare Other | Admitting: Primary Care

## 2019-05-18 ENCOUNTER — Other Ambulatory Visit: Payer: Self-pay

## 2019-05-18 DIAGNOSIS — S81811D Laceration without foreign body, right lower leg, subsequent encounter: Secondary | ICD-10-CM | POA: Diagnosis not present

## 2019-05-18 DIAGNOSIS — S81811A Laceration without foreign body, right lower leg, initial encounter: Secondary | ICD-10-CM | POA: Insufficient documentation

## 2019-05-18 NOTE — Progress Notes (Signed)
Subjective:    Patient ID: [name], female    DOB: 03-Jun-1936, 83 y.o.   MRN: [id number]  HPI  [name] is an 83 year old female who presents today for emergency department follow up.  She presented to [facility] ED on 05/10/19 with a chief complaint of laceration. She was cleaning her garage and accidentally cut the skin of her right lower extremity with her fingernail.   During her stay in the ED her wound steri-strips were provided. Dressing applied and patient was discharged home with recommendations for PCP follow up. Her Tetanus was UTD.  Since her visit to the ED she's left on the original bandage but has noticed some bleeding through band-aids that she has applied on top. She denies increased pain, increased erythema, fevers but has not looked at the laceration since the original bandage has been applied. She has not submerged the wound under water.   Review of Systems  Constitutional: Negative for fever.  Skin: Positive for wound. Negative for color change.       Past Medical History:  Diagnosis Date  . Altered mental state   . Anxiety   . BMI 30.0-30.9,adult   . Cellulitis   . Chest pain, atypical   . Compression fracture of L4 lumbar vertebra   . Constipation   . Cystitis   . Cystocele   . Diverticulosis   . Fatigue   . Fibrosis, idiopathic pulmonary ([place])   . Gastroenteritis   . History of back surgery   . History of herniated intervertebral disc   . HTN (hypertension)   . Hypercholesteremia   . Hypocalcemia   . Incontinence of urine   . OA (osteoarthritis)    shoulder and back  . Obesity   . Osteopenia   . Pneumonia   . Shortness of breath   . Sleep apnea   . Thrush   . Vitamin D deficiency      Social History   Socioeconomic History  . Marital status: Married    Spouse name: [name]  . Number of children: 2  . Years of education: Not on file  . Highest education level: Not on file  Occupational History  . Not on file  Social Needs   . Financial resource strain: Not on file  . Food insecurity    Worry: Not on file    Inability: Not on file  . Transportation needs    Medical: Not on file    Non-medical: Not on file  Tobacco Use  . Smoking status: Never Smoker  . Smokeless tobacco: Never Used  Substance and Sexual Activity  . Alcohol use: No  . Drug use: No  . Sexual activity: Never    Birth control/protection: None  Lifestyle  . Physical activity    Days per week: Not on file    Minutes per session: Not on file  . Stress: Not on file  Relationships  . Social Herbalist on phone: Not on file    Gets together: Not on file    Attends religious service: Not on file    Active member of club or organization: Not on file    Attends meetings of clubs or organizations: Not on file    Relationship status: Not on file  . Intimate partner violence    Fear of current or ex partner: Not on file    Emotionally abused: Not on file    Physically abused: Not on file  Forced sexual activity: Not on file  Other Topics Concern  . Not on file  Social History Narrative  . Not on file    Past Surgical History:  Procedure Laterality Date  . CATARACT EXTRACTION W/PHACO Left 04/24/2016   Procedure: CATARACT EXTRACTION PHACO AND INTRAOCULAR LENS PLACEMENT (IOC);  Surgeon: Leandrew Koyanagi, MD;  Location: [place];  Service: Ophthalmology;  Laterality: Left;  . CATARACT EXTRACTION W/PHACO Right 07/14/2017   Procedure: CATARACT EXTRACTION PHACO AND INTRAOCULAR LENS PLACEMENT ([place])  RIGHT;  Surgeon: Leandrew Koyanagi, MD;  Location: [place];  Service: Ophthalmology;  Laterality: Right;  . ESOPHAGOGASTRODUODENOSCOPY (EGD) WITH PROPOFOL N/A 10/13/2018   Procedure: ESOPHAGOGASTRODUODENOSCOPY (EGD) WITH PROPOFOL;  Surgeon: [name], MD;  Location: [facility] ENDOSCOPY;  Service: Gastroenterology;  Laterality: N/A;  . FLEXIBLE SIGMOIDOSCOPY N/A 10/13/2018   Procedure: FLEXIBLE SIGMOIDOSCOPY;   Surgeon: [name], MD;  Location: [facility] ENDOSCOPY;  Service: Gastroenterology;  Laterality: N/A;  . ROTATOR CUFF REPAIR Bilateral    left-12/2009; right-03/2009 dr.califf  . TOTAL SHOULDER REPLACEMENT Left     Family History  Problem Relation Age of Onset  . COPD Mother   . Cancer Brother        colon    No Known Allergies  Current Outpatient Medications on File Prior to Visit  Medication Sig Dispense Refill  . albuterol (PROVENTIL HFA;VENTOLIN HFA) 108 (90 Base) MCG/ACT inhaler Inhale into the lungs.    . citalopram (CELEXA) 20 MG tablet     . Cyanocobalamin (B-12) 2500 MCG TABS Take 2,500 mcg by mouth daily.    Marland Kitchen denosumab (PROLIA) 60 MG/ML SOSY injection Inject 60 mg into the skin every 6 (six) months.    . ESBRIET 267 MG TABS Take 6 tablets by mouth daily.     Marland Kitchen estradiol (ESTRACE VAGINAL) 0.1 MG/GM vaginal cream Place 1 Applicatorful vaginally once a week. (1gram weekly) 42.5 g 2  . HYDROmorphone (DILAUDID) 2 MG tablet Take 2 mg by mouth 2 (two) times daily.     Marland Kitchen nystatin ointment (MYCOSTATIN) APPLY  OINTMENT TOPICALLY TWICE DAILY 30 g 0  . OXYGEN Inhale 2 L into the lungs as needed.    . VOLTAREN 1 % GEL   2   No current facility-administered medications on file prior to visit.     BP 112/72   Pulse (!) 54   Temp 98.2 F (36.8 C) (Temporal)   Ht 4' 10.5" (1.486 m)   Wt 129 lb 8 oz (58.7 kg)   SpO2 94%   BMI 26.60 kg/m    Objective:   Physical Exam  Constitutional: She appears well-nourished.  Skin: Skin is warm and dry. No erythema.  Mild swelling proximal to top of laceration. Edges of wound appear to be mostly together. No bleeding, drainage, foul smell, surrounding erythema.           Assessment & Plan:

## 2019-05-18 NOTE — Patient Instructions (Signed)
Keep the wound clean and dry. Do no apply anything to the top of the wound.  Change the bandage daily in order and monitor the cut. Please notify me if you notice increased redness, increased swelling, green/white/yellow drainage with foul smell, pain.  It was a pleasure to see you today!

## 2019-05-18 NOTE — Assessment & Plan Note (Signed)
Sustained on 05/10/19, evaluated and treated at [facility] ED. Exam today without obvious infection. Would today was irrigated and cleansed, mupirocin and non stick dressing applied.  Discussed home care instructions and to monitor for s/s of infection. She will update. Tdap is UTD.

## 2019-05-20 DIAGNOSIS — M25612 Stiffness of left shoulder, not elsewhere classified: Secondary | ICD-10-CM | POA: Diagnosis not present

## 2019-05-20 DIAGNOSIS — M25512 Pain in left shoulder: Secondary | ICD-10-CM | POA: Diagnosis not present

## 2019-05-20 DIAGNOSIS — M6281 Muscle weakness (generalized): Secondary | ICD-10-CM | POA: Diagnosis not present

## 2019-05-20 DIAGNOSIS — M545 Low back pain: Secondary | ICD-10-CM | POA: Diagnosis not present

## 2019-05-24 DIAGNOSIS — M6281 Muscle weakness (generalized): Secondary | ICD-10-CM | POA: Diagnosis not present

## 2019-05-24 DIAGNOSIS — M545 Low back pain: Secondary | ICD-10-CM | POA: Diagnosis not present

## 2019-05-24 DIAGNOSIS — M25612 Stiffness of left shoulder, not elsewhere classified: Secondary | ICD-10-CM | POA: Diagnosis not present

## 2019-05-24 DIAGNOSIS — M25512 Pain in left shoulder: Secondary | ICD-10-CM | POA: Diagnosis not present

## 2019-05-25 ENCOUNTER — Ambulatory Visit (INDEPENDENT_AMBULATORY_CARE_PROVIDER_SITE_OTHER): Payer: Medicare Other | Admitting: Gastroenterology

## 2019-05-25 ENCOUNTER — Other Ambulatory Visit: Payer: Self-pay

## 2019-05-25 ENCOUNTER — Encounter: Payer: Self-pay | Admitting: Gastroenterology

## 2019-05-25 VITALS — BP 142/65 | HR 51 | Temp 98.2°F | Ht 58.5 in | Wt 127.8 lb

## 2019-05-25 DIAGNOSIS — K5903 Drug induced constipation: Secondary | ICD-10-CM

## 2019-05-25 DIAGNOSIS — K529 Noninfective gastroenteritis and colitis, unspecified: Secondary | ICD-10-CM | POA: Diagnosis not present

## 2019-05-25 NOTE — Progress Notes (Signed)
[name] MD, MRCP(U.K) [address]  [place]  [place], [place] [address]  Main: [phone]  Fax: [phone]   Primary Care Physician: [name], NP  Primary Gastroenterologist:  Dr. [name]   No chief complaint on file.   HPI: [name] is a 83 y.o. female    Summary of history : She is here today to see me as a follow up for diarrhea last seen 4 weeks back. She was seen by her primary care on 09/29/2018 for acute diarrhea. At that time it has been going on for 2 months.    Looking back into her notes she was seen in 2018 by [facility] clinic GI and appears that in 08/2008 she had a colonoscopy as per the last GI note showed chronic colitis with focal cryptitis. Focal active colitis.  Not sure why it was not felt that this patient had inflammatory bowel disease.  Been on Meloxicam for about 2-3 Months, 2 tablets a day and no other NSAID's  09/30/2018: GI PCR negative, 09/29/2018 hemoglobin 12.5 g with a white cell count of 15.3. No diarrhea prior to this . Blood in her stool ongoing for more than a week.  10/13/2018: EGD: normal . Sigmoidoscopy Discontinuous areas of nonbleeding ulcerated mucosa with no stigmata of recent bleeding were present in the sigmoid colon, in thedescending colon and in the distal transverse colon. Biopsies were taken with a cold forceps for histology.Rectum was normal. Pathology report showed chronic active colitis and normal rectal biopsies. HSV and CMV negative.    Interval history  11/05/2018-05/25/2019  Control meloxicam and doing well.  Denies any diarrhea.  On Dilaudid been having some constipation.  Left upper quadrant pain relieved with defecation.  Daily bowel movements but sensation of incomplete defecation.  No rectal bleeding.   Current Outpatient Medications  Medication Sig Dispense Refill  . albuterol (PROVENTIL HFA;VENTOLIN HFA) 108 (90 Base) MCG/ACT inhaler Inhale into the lungs.    Marland Kitchen  CALCIUM PO Take 600 mcg by mouth daily.    . Cholecalciferol (D3 VITAMIN PO) Take 25 mcg by mouth 2 (two) times daily.    . citalopram (CELEXA) 20 MG tablet     . Cyanocobalamin (B-12) 2500 MCG TABS Take 2,500 mcg by mouth daily.    Marland Kitchen denosumab (PROLIA) 60 MG/ML SOSY injection Inject 60 mg into the skin every 6 (six) months.    . ESBRIET 267 MG TABS Take 6 tablets by mouth daily.     Marland Kitchen estradiol (ESTRACE VAGINAL) 0.1 MG/GM vaginal cream Place 1 Applicatorful vaginally once a week. (1gram weekly) 42.5 g 2  . HYDROmorphone (DILAUDID) 4 MG tablet Take 4 mg by mouth 3 (three) times daily.     Marland Kitchen nystatin ointment (MYCOSTATIN) APPLY  OINTMENT TOPICALLY TWICE DAILY 30 g 0  . OXYGEN Inhale 2 L into the lungs as needed.    . VOLTAREN 1 % GEL   2   No current facility-administered medications for this visit.     Allergies as of 05/25/2019  . (No Known Allergies)    ROS:  General: Negative for anorexia, weight loss, fever, chills, fatigue, weakness. ENT: Negative for hoarseness, difficulty swallowing , nasal congestion. CV: Negative for chest pain, angina, palpitations, dyspnea on exertion, peripheral edema.  Respiratory: Negative for dyspnea at rest, dyspnea on exertion, cough, sputum, wheezing.  GI: See history of present illness. GU:  Negative for dysuria, hematuria, urinary incontinence, urinary frequency, nocturnal urination.  Endo: Negative for unusual weight  change.    Physical Examination:   There were no vitals taken for this visit.  General: Well-nourished, well-developed in no acute distress.  Eyes: No icterus. Conjunctivae pink. Mouth: Oropharyngeal mucosa moist and pink , no lesions erythema or exudate. Lungs: Clear to auscultation bilaterally. Non-labored. Heart: Regular rate and rhythm, no murmurs rubs or gallops.  Abdomen: Bowel sounds are normal, nontender, nondistended, no hepatosplenomegaly or masses, no abdominal bruits or hernia , no rebound or guarding.    Extremities: No lower extremity edema. No clubbing or deformities. Neuro: Alert and oriented x 3.  Grossly intact. Skin: Warm and dry, no jaundice.   Psych: Alert and cooperative, normal mood and affect.   Imaging Studies: No results found.  Assessment and Plan:   [name] is a 83 y.o. 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  Stop meloxicam and all symptoms resolved.  Having some symptoms of IBS constipation probably secondary to Dilaudid.    Plan 1.  Stop all NSAIDs 2.  CBC CRP 3.  Commence on MiraLAX daily for constipation secondary to Dilaudid 4.  Flexible sigmoidoscopy to ensure that the mucosa which was ulcerated previously has healed.  I have discussed alternative options, risks & benefits,  which include, but are not limited to, bleeding, infection, perforation,respiratory complication & drug reaction.  The patient agrees with this plan & written consent will be obtained.     Dr [name]  MD,MRCP [facility]) Follow up in 8-12 weeks

## 2019-05-26 LAB — CBC WITH DIFFERENTIAL/PLATELET
Basophils Absolute: 0.1 10*3/uL (ref 0.0–0.2)
Basos: 1 %
EOS (ABSOLUTE): 0.2 10*3/uL (ref 0.0–0.4)
Eos: 2 %
Hematocrit: 38.6 % (ref 34.0–46.6)
Hemoglobin: 13.1 g/dL (ref 11.1–15.9)
Immature Grans (Abs): 0 10*3/uL (ref 0.0–0.1)
Immature Granulocytes: 0 %
Lymphocytes Absolute: 1.8 10*3/uL (ref 0.7–3.1)
Lymphs: 21 %
MCH: 30.8 pg (ref 26.6–33.0)
MCHC: 33.9 g/dL (ref 31.5–35.7)
MCV: 91 fL (ref 79–97)
Monocytes Absolute: 0.9 10*3/uL (ref 0.1–0.9)
Monocytes: 11 %
Neutrophils Absolute: 5.7 10*3/uL (ref 1.4–7.0)
Neutrophils: 65 %
Platelets: 201 10*3/uL (ref 150–450)
RBC: 4.26 x10E6/uL (ref 3.77–5.28)
RDW: 12.8 % (ref 11.7–15.4)
WBC: 8.7 10*3/uL (ref 3.4–10.8)

## 2019-05-26 LAB — C-REACTIVE PROTEIN: CRP: <1 mg/L (ref 0–10)

## 2019-05-27 ENCOUNTER — Encounter: Payer: Self-pay | Admitting: Obstetrics and Gynecology

## 2019-05-27 ENCOUNTER — Ambulatory Visit (INDEPENDENT_AMBULATORY_CARE_PROVIDER_SITE_OTHER): Payer: Medicare Other | Admitting: Obstetrics and Gynecology

## 2019-05-27 ENCOUNTER — Other Ambulatory Visit: Payer: Self-pay

## 2019-05-27 VITALS — BP 104/72 | HR 80 | Ht 58.5 in | Wt 124.6 lb

## 2019-05-27 DIAGNOSIS — N952 Postmenopausal atrophic vaginitis: Secondary | ICD-10-CM

## 2019-05-27 DIAGNOSIS — Z01411 Encounter for gynecological examination (general) (routine) with abnormal findings: Secondary | ICD-10-CM

## 2019-05-27 DIAGNOSIS — Z01419 Encounter for gynecological examination (general) (routine) without abnormal findings: Secondary | ICD-10-CM | POA: Diagnosis not present

## 2019-05-27 DIAGNOSIS — R32 Unspecified urinary incontinence: Secondary | ICD-10-CM | POA: Insufficient documentation

## 2019-05-27 DIAGNOSIS — N8110 Cystocele, unspecified: Secondary | ICD-10-CM | POA: Insufficient documentation

## 2019-05-27 DIAGNOSIS — M8580 Other specified disorders of bone density and structure, unspecified site: Secondary | ICD-10-CM | POA: Insufficient documentation

## 2019-05-27 DIAGNOSIS — Z960 Presence of urogenital implants: Secondary | ICD-10-CM

## 2019-05-27 NOTE — Patient Instructions (Signed)

## 2019-05-27 NOTE — Progress Notes (Signed)
ANNUAL PREVENTATIVE CARE GYNECOLOGY  ENCOUNTER NOTE  Subjective:       [name] is a 83 y.o. G2P2 female here for a routine annual gynecologic exam. The patient is not sexually active. The patient is using local hormone replacement therapy (Estrace cream, however has not used recently), not currently on systemic HRT. Patient reports post-menopausal vaginal bleeding with the use of her pessary. The patient wears seatbelts: yes. The patient participates in regular exercise: no. Has the patient ever been transfused or tattooed?: no. The patient reports that there is not domestic violence in her life.  Current complaints: 1.  Notes vaginal odor is getting worse with her pessary use. She also feels as though she is leaking urine despite use of the current pessary.  Is sometimes difficult to tell if the leaking she is feeling is urine or vaginal discharge. Reports use of her Trimosan gel as needed, however stopped using her Estrace cream in September (felt it was contributing to her copious vaginal discharge)   Gynecologic History No LMP recorded. Patient is postmenopausal. Contraception: post menopausal status Last Pap: no longer needed.  Denies history of any abnormal pap smears.  Last mammogram: 06/18/2017. Results were: abnormal (BIRADS-0, possible mass of left breast). F/u left breast ultrasound was normal.  Last Colonoscopy: patient had flexible sigmoidoscopy in 2009 due to colitis.  Is due for repeat later this month.  Also had an upper endoscopy in February 2020. Last Dexa Scan: January 2020.  Results were: Osteopenia, with T-score -1.4.   Obstetric History OB History  Gravida Para Term Preterm AB Living  2 2          SAB TAB Ectopic Multiple Live Births               # Outcome Date GA Lbr Len/2nd Weight Sex Delivery Anes PTL Lv  2 Para           1 Para             Past Medical History:  Diagnosis Date   Altered mental state    Anxiety    BMI 30.0-30.9,adult     Cellulitis    Chest pain, atypical    Compression fracture of L4 lumbar vertebra    Constipation    Cystitis    Cystocele    Diverticulosis    Fatigue    Fibrosis, idiopathic pulmonary ([facility])    Gastroenteritis    History of back surgery    History of herniated intervertebral disc    HTN (hypertension)    Hypercholesteremia    Hypocalcemia    Incontinence of urine    OA (osteoarthritis)    shoulder and back   Obesity    Osteopenia    Pneumonia    Shortness of breath    Sleep apnea    Thrush    Vitamin D deficiency     Family History  Problem Relation Age of Onset   COPD Mother    Cancer Brother        colon    Past Surgical History:  Procedure Laterality Date   CATARACT EXTRACTION W/PHACO Left 04/24/2016   Procedure: CATARACT EXTRACTION PHACO AND INTRAOCULAR LENS PLACEMENT (IOC);  Surgeon: Leandrew Koyanagi, MD;  Location: [place];  Service: Ophthalmology;  Laterality: Left;   CATARACT EXTRACTION W/PHACO Right 07/14/2017   Procedure: CATARACT EXTRACTION PHACO AND INTRAOCULAR LENS PLACEMENT ([place])  RIGHT;  Surgeon: Leandrew Koyanagi, MD;  Location: [place];  Service: Ophthalmology;  Laterality: Right;   ESOPHAGOGASTRODUODENOSCOPY (EGD) WITH PROPOFOL N/A 10/13/2018   Procedure: ESOPHAGOGASTRODUODENOSCOPY (EGD) WITH PROPOFOL;  Surgeon: [name], MD;  Location: [facility] ENDOSCOPY;  Service: Gastroenterology;  Laterality: N/A;   FLEXIBLE SIGMOIDOSCOPY N/A 10/13/2018   Procedure: FLEXIBLE SIGMOIDOSCOPY;  Surgeon: [name], MD;  Location: [facility] ENDOSCOPY;  Service: Gastroenterology;  Laterality: N/A;   ROTATOR CUFF REPAIR Bilateral    left-12/2009; right-03/2009 dr.califf   TOTAL SHOULDER REPLACEMENT Left     Social History   Socioeconomic History   Marital status: Married    Spouse name: [name]   Number of children: 2   Years of education: Not on file   Highest education level: Not on file  Occupational  History   Not on file  Social Needs   Financial resource strain: Not on file   Food insecurity    Worry: Not on file    Inability: Not on file   Transportation needs    Medical: Not on file    Non-medical: Not on file  Tobacco Use   Smoking status: Never Smoker   Smokeless tobacco: Never Used  Substance and Sexual Activity   Alcohol use: No   Drug use: No   Sexual activity: Never    Birth control/protection: None  Lifestyle   Physical activity    Days per week: Not on file    Minutes per session: Not on file   Stress: Not on file  Relationships   Social connections    Talks on phone: Not on file    Gets together: Not on file    Attends religious service: Not on file    Active member of club or organization: Not on file    Attends meetings of clubs or organizations: Not on file    Relationship status: Not on file   Intimate partner violence    Fear of current or ex partner: Not on file    Emotionally abused: Not on file    Physically abused: Not on file    Forced sexual activity: Not on file  Other Topics Concern   Not on file  Social History Narrative   Not on file    Current Outpatient Medications on File Prior to Visit  Medication Sig Dispense Refill   albuterol (PROVENTIL HFA;VENTOLIN HFA) 108 (90 Base) MCG/ACT inhaler Inhale into the lungs.     CALCIUM PO Take 600 mcg by mouth daily.     Cholecalciferol (D3 VITAMIN PO) Take 25 mcg by mouth 2 (two) times daily.     citalopram (CELEXA) 20 MG tablet      Cyanocobalamin (B-12) 2500 MCG TABS Take 2,500 mcg by mouth daily.     denosumab (PROLIA) 60 MG/ML SOSY injection Inject 60 mg into the skin every 6 (six) months.     ESBRIET 267 MG TABS Take 6 tablets by mouth daily.      estradiol (ESTRACE VAGINAL) 0.1 MG/GM vaginal cream Place 1 Applicatorful vaginally once a week. (1gram weekly) 42.5 g 2   HYDROmorphone (DILAUDID) 4 MG tablet Take 4 mg by mouth 3 (three) times daily.      nystatin  ointment (MYCOSTATIN) APPLY  OINTMENT TOPICALLY TWICE DAILY 30 g 0   OXYGEN Inhale 2 L into the lungs as needed.     VOLTAREN 1 % GEL   2   No current facility-administered medications on file prior to visit.     No Known Allergies    Review of Systems ROS Review of Systems - General ROS:  negative for - chills, fatigue, fever, hot flashes, night sweats, weight gain or weight loss Psychological ROS: negative for - anxiety, decreased libido, depression, mood swings, physical abuse or sexual abuse Ophthalmic ROS: negative for - blurry vision, eye pain or loss of vision ENT ROS: negative for - headaches, hearing change, visual changes or vocal changes Allergy and Immunology ROS: negative for - hives, itchy/watery eyes or seasonal allergies Hematological and Lymphatic ROS: negative for - bleeding problems, bruising, swollen lymph nodes or weight loss Endocrine ROS: negative for - galactorrhea, hair pattern changes, hot flashes, malaise/lethargy, mood swings, palpitations, polydipsia/polyuria, skin changes, temperature intolerance or unexpected weight changes Breast ROS: negative for - new or changing breast lumps or nipple discharge Respiratory ROS: negative for - cough or shortness of breath Cardiovascular ROS: negative for - chest pain, irregular heartbeat, palpitations or shortness of breath Gastrointestinal ROS: no abdominal pain, change in bowel habits, or black or bloody stools Genito-Urinary ROS: no dysuria, trouble voiding, or hematuria.  Positive for vaginal discharge with odor, and urinary leaking. Musculoskeletal ROS: negative for - joint pain or joint stiffness Neurological ROS: negative for - bowel and bladder control changes Dermatological ROS: negative for rash and skin lesion changes   Objective:   BP 104/72    Pulse 80    Ht 4' 10.5" (1.486 m)    Wt 124 lb 9.6 oz (56.5 kg)    BMI 25.60 kg/m  CONSTITUTIONAL: Well-developed, well-nourished female in no acute distress.    PSYCHIATRIC: Normal mood and affect. Normal behavior. Normal judgment and thought content. [place]: Alert and oriented to person, place, and time. Normal muscle tone coordination. No cranial nerve deficit noted. HENT:  Normocephalic, atraumatic, External right and left ear normal. Oropharynx is clear and moist EYES: Conjunctivae and EOM are normal. Pupils are equal, round, and reactive to light. No scleral icterus.  NECK: Normal range of motion, supple, no masses.  Normal thyroid.  SKIN: Skin is warm and dry. No rash noted. Not diaphoretic. No erythema. No pallor. CARDIOVASCULAR: Normal heart rate noted, regular rhythm, no murmur. RESPIRATORY: Clear to auscultation bilaterally. Effort and breath sounds normal, no problems with respiration noted. BREASTS: Symmetric in size. No masses, skin changes, nipple drainage, or lymphadenopathy. ABDOMEN: Soft, normal bowel sounds, no distention noted.  No tenderness, rebound or guarding.  BLADDER: Normal PELVIC:  Bladder no bladder distension noted  Urethra: normal appearing urethra with no masses, tenderness or lesions  Vulva: normal appearing vulva with no masses, tenderness or lesions  Vagina: Size 3 ring with support pessary was removed, and cleaned.  Vaginal mucosa atrophic (mild), with small amount of yellow-green discharge in vaginal vault, also with scant dark red blood in vaginal vault. Grade 2 cystocele.  Small abrasion noted at anterior vaginal wall ~ 2 cm from introitus  Cervix: normal appearing cervix without discharge or lesions  Uterus: uterus is normal size, shape, consistency and nontender  Adnexa: normal adnexa in size, nontender and no masses  RV: External Exam NormaI, No Rectal Masses and Normal Sphincter tone  MUSCULOSKELETAL: Normal range of motion. No tenderness.  No cyanosis, clubbing, or edema.  2+ distal pulses. LYMPHATIC: No Axillary, Supraclavicular, or Inguinal Adenopathy.   Labs:  05/27/19: Microscopic wet-mount exam  shows negative for pathogens, normal epithelial cells, few white blood cells.   Lab Results  Component Value Date   WBC 8.7 05/25/2019   HGB 13.1 05/25/2019   HCT 38.6 05/25/2019   MCV 91 05/25/2019   PLT 201 05/25/2019  Lab Results  Component Value Date   CREATININE 1.08 (H) 10/07/2018   BUN 18 10/07/2018   NA 141 10/07/2018   K 3.8 10/07/2018   CL 105 10/07/2018   CO2 27 10/07/2018    Lab Results  Component Value Date   ALT 36 (H) 02/03/2017   AST 35 02/03/2017   ALKPHOS 94 02/03/2017   BILITOT 1.0 02/03/2017    Lab Results  Component Value Date   CHOL 199 02/03/2017   HDL 69 02/03/2017   LDLCALC 103 (H) 02/03/2017   TRIG 137 02/03/2017   CHOLHDL 2.9 02/03/2017    Lab Results  Component Value Date   TSH 0.922 09/16/2018      Assessment:   Encounter for gynecological examination (general) (routine) with abnormal findings Vaginal pessary in situ Vaginal atrophy Urinary incontinence, unspecified type Female cystocele Osteopenia, unspecified location  Plan:  Pap: Not needed. Beyond recommended screening age. Mammogram: Not Indicated. Beyond recommended screening age.  Stool Guaiac Testing:  Not Ordered.  Patient due for sigmoidoscopy later this month. Labs: None ordered.  Usually performed by PCP.  Routine preventative health maintenance measures emphasized: Exercise/Diet/Weight control, Tobacco Warnings, Alcohol/Substance use risks, Stress Management and Peer Pressure Issues Patient encouraged on use of Estrace to help with abrasion and pessary maintenance. Discussed option with patient regarding trial of a new pessary to help with her worsening urinary leaking.  Patient currently wears size 3 ring with support, can order size 3 ring with support and knob.  Return to Clinic -2 weeks for new pessary insertion.   Rubie Maid, MD [facility]

## 2019-05-27 NOTE — Progress Notes (Signed)
Pt is present for pessary check. Pt stated noticing leaking and vaginal discharge with odor no itching.

## 2019-05-28 ENCOUNTER — Other Ambulatory Visit: Payer: Medicare Other

## 2019-05-31 ENCOUNTER — Other Ambulatory Visit
Admission: RE | Admit: 2019-05-31 | Discharge: 2019-05-31 | Disposition: A | Discharge status: Home / Self Care | Payer: Medicare Other | Source: Ambulatory Visit | Attending: Gastroenterology | Admitting: Gastroenterology

## 2019-05-31 ENCOUNTER — Other Ambulatory Visit: Payer: Self-pay

## 2019-05-31 DIAGNOSIS — Z01812 Encounter for preprocedural laboratory examination: Secondary | ICD-10-CM | POA: Diagnosis not present

## 2019-05-31 DIAGNOSIS — Z20828 Contact with and (suspected) exposure to other viral communicable diseases: Secondary | ICD-10-CM | POA: Insufficient documentation

## 2019-05-31 LAB — SARS CORONAVIRUS 2 (TAT 6-24 HRS): SARS Coronavirus 2: NEGATIVE

## 2019-06-01 ENCOUNTER — Encounter: Payer: Self-pay | Admitting: *Deleted

## 2019-06-02 ENCOUNTER — Other Ambulatory Visit: Payer: Self-pay

## 2019-06-02 ENCOUNTER — Ambulatory Visit
Admission: RE | Admit: 2019-06-02 | Discharge: 2019-06-02 | Disposition: A | Discharge status: Home / Self Care | Payer: Medicare Other | Attending: Gastroenterology | Admitting: Gastroenterology

## 2019-06-02 ENCOUNTER — Encounter
Admission: RE | Disposition: A | Discharge status: Home / Self Care | Payer: Self-pay | Source: Home / Self Care | Attending: Gastroenterology

## 2019-06-02 ENCOUNTER — Encounter: Payer: Self-pay | Admitting: Emergency Medicine

## 2019-06-02 DIAGNOSIS — R9431 Abnormal electrocardiogram [ECG] [EKG]: Secondary | ICD-10-CM | POA: Insufficient documentation

## 2019-06-02 DIAGNOSIS — K529 Noninfective gastroenteritis and colitis, unspecified: Secondary | ICD-10-CM

## 2019-06-02 DIAGNOSIS — R001 Bradycardia, unspecified: Secondary | ICD-10-CM | POA: Diagnosis not present

## 2019-06-02 DIAGNOSIS — Z5309 Procedure and treatment not carried out because of other contraindication: Secondary | ICD-10-CM | POA: Insufficient documentation

## 2019-06-02 SURGERY — SIGMOIDOSCOPY, FLEXIBLE

## 2019-06-02 MED ORDER — SODIUM CHLORIDE 0.9 % IV SOLN: INTRAVENOUS | Status: DC

## 2019-06-02 NOTE — H&P (Signed)
Patient came in for a sigmoidoscopy and in the preop her heart rate on the monitor was 35 bpm.  She was asymptomatic and normotensive.  By the time we obtained an EKG her heart rate had gone up to 55 bpm.  Still asymptomatic.  EKG shows sinus bradycardia.  Normal PR interval normal QTc interval.  Decided to cancel the procedure.  Called Dr. Ubaldo Glassing in cardiology on call and he advised that he will have her be seen in the office tomorrow.  Advised the patient if she develops dizziness or lightheadedness she must come to the ER right away.  Dr [name] MD,MRCP [facility]) Gastroenterology/Hepatology Pager: [phone]

## 2019-06-02 NOTE — OR Nursing (Signed)
Procedure cancelled due to new onset asymptomatic bradycardia. Patient instructed by Dr. Vicente Males to follow-up with cardiologist who would call her to schedule appointment. Patient was discharged to spouse's care via wheelchair.

## 2019-06-03 ENCOUNTER — Telehealth: Payer: Self-pay

## 2019-06-03 DIAGNOSIS — E782 Mixed hyperlipidemia: Secondary | ICD-10-CM | POA: Diagnosis not present

## 2019-06-03 DIAGNOSIS — R0602 Shortness of breath: Secondary | ICD-10-CM | POA: Diagnosis not present

## 2019-06-03 DIAGNOSIS — G4733 Obstructive sleep apnea (adult) (pediatric): Secondary | ICD-10-CM | POA: Diagnosis not present

## 2019-06-03 DIAGNOSIS — G3184 Mild cognitive impairment, so stated: Secondary | ICD-10-CM | POA: Diagnosis not present

## 2019-06-03 DIAGNOSIS — R001 Bradycardia, unspecified: Secondary | ICD-10-CM | POA: Diagnosis not present

## 2019-06-03 DIAGNOSIS — J8410 Pulmonary fibrosis, unspecified: Secondary | ICD-10-CM | POA: Diagnosis not present

## 2019-06-03 NOTE — Telephone Encounter (Signed)
Sounds good, once cleared from the cardiac standpoint we can reschedule her endoscopy

## 2019-06-03 NOTE — Telephone Encounter (Signed)
Patient states she went and saw Dr. Clayborn Bigness today and he didn't think anything was wrong with her heart. She states that he has gave her a monitor to wear for 3 days and mail back. He is going to set up a echocardiogram for patient to have. She states he will clear her after all these test are done

## 2019-06-03 NOTE — Telephone Encounter (Signed)
Please review

## 2019-06-06 ENCOUNTER — Encounter: Payer: Self-pay | Admitting: Gastroenterology

## 2019-06-07 ENCOUNTER — Telehealth: Payer: Self-pay | Admitting: Gastroenterology

## 2019-06-07 ENCOUNTER — Emergency Department
Admission: EM | Admit: 2019-06-07 | Discharge: 2019-06-07 | Disposition: A | Discharge status: Home / Self Care | Payer: Medicare Other | Attending: Emergency Medicine | Admitting: Emergency Medicine

## 2019-06-07 ENCOUNTER — Other Ambulatory Visit: Payer: Self-pay

## 2019-06-07 DIAGNOSIS — R109 Unspecified abdominal pain: Secondary | ICD-10-CM | POA: Diagnosis present

## 2019-06-07 DIAGNOSIS — B029 Zoster without complications: Secondary | ICD-10-CM | POA: Insufficient documentation

## 2019-06-07 DIAGNOSIS — I10 Essential (primary) hypertension: Secondary | ICD-10-CM | POA: Diagnosis not present

## 2019-06-07 DIAGNOSIS — Z96612 Presence of left artificial shoulder joint: Secondary | ICD-10-CM | POA: Diagnosis not present

## 2019-06-07 DIAGNOSIS — Z79899 Other long term (current) drug therapy: Secondary | ICD-10-CM | POA: Diagnosis not present

## 2019-06-07 MED ORDER — PREDNISONE 20 MG PO TABS: 40.0000 mg | ORAL_TABLET | Freq: Every day | ORAL | 0 refills | Status: AC

## 2019-06-07 MED ORDER — VALACYCLOVIR HCL 1 G PO TABS: 1000.0000 mg | ORAL_TABLET | Freq: Three times a day (TID) | ORAL | 0 refills | Status: AC

## 2019-06-07 NOTE — Telephone Encounter (Signed)
Pt is having severe Right side pain she states since Friday no nausea no vomiting. She needs to speak to  Dr. Vicente Males   She states her pain is a 10 out of a 10.  Ginger is speaking to pt and advising her to go to ER if her discomfort is that bad.

## 2019-06-07 NOTE — Telephone Encounter (Signed)
Agree with going to ER

## 2019-06-07 NOTE — Telephone Encounter (Signed)
Spoke with pt regarding her abdominal pain. Pt stated it started on Friday and has continuously gotten worse over the weekend. No nausea, vomiting, fever or bloody diarrhea noted. Pt stated she was suppose to have had a flex sigmoidoscopy on 06/02/19 but had to wear a heart monitor so it got cancelled. Pt did state her pain was a 10 out of 10. I advised her is she is in that much discomfort, she should go to the ER and be evaluated for the cause of the increased pain. Message has also been sent to Dr. Vicente Males.

## 2019-06-07 NOTE — ED Triage Notes (Signed)
Pt c/o mid right sided abd pain since Friday, denies N/V/D.Marland Kitchen pt is moaning in pain at present.

## 2019-06-07 NOTE — ED Provider Notes (Signed)
[facility] Emergency Department Provider Note   ____________________________________________    I have reviewed the triage vital signs and the nursing notes.   HISTORY  Chief Complaint Abdominal Pain     HPI [name] is a 83 y.o. female who presents with complaints of right-sided abdominal pain.  Patient reports approximately 3 days ago she developed stinging and burning pain in her right side/right abdomen.  She reports this is worsened over the last several days.  She is never had this before.  Denies history of kidney stones.  Has a history of a cholecystectomy and appendectomy.  No fevers or chills.  No nausea or vomiting.  Denies dysuria  Past Medical History:  Diagnosis Date  . Altered mental state   . Anxiety   . BMI 30.0-30.9,adult   . Cellulitis   . Chest pain, atypical   . Compression fracture of L4 lumbar vertebra   . Constipation   . Cystitis   . Cystocele   . Diverticulosis   . Fatigue   . 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 Gastroenteritis   . History of back surgery   . History of herniated intervertebral disc   . HTN (hypertension)   . Hypercholesteremia   . Hypocalcemia   . Incontinence of urine   . OA (osteoarthritis)    shoulder and back  . Obesity   . Osteopenia   . Pneumonia   . Shortness of breath   . Sleep apnea   . Thrush   . Vitamin D deficiency     Patient Active Problem List   Diagnosis Date Noted  . Osteopenia 05/27/2019  . Female cystocele 05/27/2019  . Urinary incontinence 05/27/2019  . Laceration of skin of right lower leg 05/18/2019  . Rectal bleeding 10/06/2018  . Acute diarrhea 09/29/2018  . Fatigue 09/29/2018  . Vitamin D deficiency 03/23/2018  . 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 Tinea corporis 04/26/2016  . Family history of colon cancer requiring screening colonoscopy 04/26/2016  . Slow transit constipation 04/26/2016  . Candidiasis 01/17/2016  . Hypercholesteremia 04/24/2015   . Mild cognitive impairment 04/24/2015  . Compression fracture of L4 lumbar vertebra 04/24/2015  . Vaginal pessary in situ 04/19/2015  . Cystocele with uterine descensus 03/30/2015  . Anxiety 02/02/2015  . Arthritis 02/02/2015  . Hypertension 02/02/2015  . Sleep apnea 02/02/2015  . 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 CATARACT EXTRACTION W/PHACO Left 04/24/2016   Procedure: CATARACT EXTRACTION PHACO AND INTRAOCULAR LENS PLACEMENT (IOC);  Surgeon: Leandrew Koyanagi, MD;  Location: [place];  Service: Ophthalmology;  Laterality: Left;  . CATARACT EXTRACTION W/PHACO Right 07/14/2017   Procedure: CATARACT EXTRACTION PHACO AND INTRAOCULAR LENS PLACEMENT ([place])  RIGHT;  Surgeon: Leandrew Koyanagi, MD;  Location: [place];  Service: Ophthalmology;  Laterality: Right;  . ESOPHAGOGASTRODUODENOSCOPY (EGD) WITH PROPOFOL N/A 10/13/2018   Procedure: ESOPHAGOGASTRODUODENOSCOPY (EGD) WITH PROPOFOL;  Surgeon: [name], MD;  Location: [facility] ENDOSCOPY;  Service: Gastroenterology;  Laterality: N/A;  . FLEXIBLE SIGMOIDOSCOPY N/A 10/13/2018   Procedure: FLEXIBLE SIGMOIDOSCOPY;  Surgeon: [name], MD;  Location: [facility] ENDOSCOPY;  Service: Gastroenterology;  Laterality: N/A;  . ROTATOR CUFF REPAIR Bilateral    left-12/2009; right-03/2009 dr.califf  . TOTAL SHOULDER REPLACEMENT Left     Prior to Admission medications   Medication Sig Start Date End Date Taking? Authorizing Provider  albuterol (PROVENTIL HFA;VENTOLIN HFA) 108 (90 Base) MCG/ACT inhaler Inhale into the lungs.  06/24/18 06/24/19  [provider]  CALCIUM PO Take 600 mcg by mouth daily.    [provider]  Cholecalciferol (D3 VITAMIN PO) Take 25 mcg by mouth 2 (two) times daily.    [provider]  citalopram (CELEXA) 20 MG tablet  07/31/18   [provider]  Cyanocobalamin (B-12) 2500 MCG TABS Take 2,500 mcg by  mouth daily.    [provider]  denosumab (PROLIA) 60 MG/ML SOSY injection Inject 60 mg into the skin every 6 (six) months.    [provider]  ESBRIET 267 MG TABS Take 6 tablets by mouth daily.  02/25/17   [provider]  estradiol (ESTRACE VAGINAL) 0.1 MG/GM vaginal cream Place 1 Applicatorful vaginally once a week. (1gram weekly) 06/16/18   Rubie Maid, MD  HYDROmorphone (DILAUDID) 4 MG tablet Take 4 mg by mouth 3 (three) times daily.  04/23/19   [provider]  nystatin ointment (MYCOSTATIN) APPLY  OINTMENT TOPICALLY TWICE DAILY 12/02/18   Rubie Maid, MD  OXYGEN Inhale 2 L into the lungs as needed.    [provider]  predniSONE (DELTASONE) 20 MG tablet Take 2 tablets (40 mg total) by mouth daily for 5 days. 06/07/19 06/12/19  Lavonia Drafts, MD  valACYclovir (VALTREX) 1000 MG tablet Take 1 tablet (1,000 mg total) by mouth 3 (three) times daily for 7 days. 06/07/19 06/14/19  Lavonia Drafts, MD  VOLTAREN 1 % GEL  12/23/17   [provider]     Allergies Patient has no known allergies.  Family History  Problem Relation Age of Onset  . COPD Mother   . Cancer Brother        colon    Social History Social History   Tobacco Use  . Smoking status: Never Smoker  . Smokeless tobacco: Never Used  Substance Use Topics  . Alcohol use: No  . Drug use: No    Review of Systems  Constitutional: No fever/chills Eyes: No visual changes.  ENT: No sore throat. Cardiovascular: Denies chest pain. Respiratory: Denies shortness of breath. Gastrointestinal: As above Genitourinary: As above Musculoskeletal: Negative for back pain. Skin: No injuries Neurological: Negative for headaches   ____________________________________________   PHYSICAL EXAM:  VITAL SIGNS: ED Triage Vitals  Enc Vitals Group     BP 06/07/19 0936 (!) 153/49     Pulse Rate 06/07/19 0937 (!) 31     Resp 06/07/19 0937 18     Temp 06/07/19 0937 98.2 F (36.8  C)     Temp Source 06/07/19 0937 Oral     SpO2 06/07/19 0937 96 %     Weight 06/07/19 0932 55.8 kg (123 lb)     Height 06/07/19 0932 1.524 m (5')     Head Circumference --      Peak Flow --      Pain Score 06/07/19 0932 10     Pain Loc --      Pain Edu? --      Excl. in [place]? --     Constitutional: Alert and oriented.   Nose: No congestion/rhinnorhea. Mouth/Throat: Mucous membranes are moist.    Cardiovascular: Normal rate, regular rhythm. Grossly normal heart sounds.  Good peripheral circulation. Respiratory: Normal respiratory effort.  No retractions. Gastrointestinal: Soft and nontender. No distention.   Musculoskeletal:  Warm and well perfused Neurologic:  Normal speech and language. No gross focal neurologic deficits are appreciated.  Skin:  Skin is warm, dry and intact.  Patient with zoster rash to  the right flank which corresponds exactly where she is having pain Psychiatric: Mood and affect are normal. Speech and behavior are normal.  ____________________________________________   LABS (all labs ordered are listed, but only abnormal results are displayed)  Labs Reviewed - No data to display ____________________________________________  EKG  None ____________________________________________  RADIOLOGY   ____________________________________________   PROCEDURES  Procedure(s) performed: No  Procedures   Critical Care performed: No ____________________________________________   INITIAL IMPRESSION / ASSESSMENT AND PLAN / ED COURSE  Pertinent labs & imaging results that were available during my care of the patient were reviewed by me and considered in my medical decision making (see chart for details).  On exam zoster rash noted, patient was unaware of this.  Area of rash is exactly where the patient is having pain, symptoms started 3 days ago however she has significant discomfort from it, we will start Valtrex and prednisone, she takes Dilaudid for  chronic back  pain, counseled her to take this for her shingles    ____________________________________________   FINAL CLINICAL IMPRESSION(S) / ED DIAGNOSES  Final diagnoses:  Herpes zoster without complication        Note:  This document was prepared using Dragon voice recognition software and may include unintentional dictation errors.   Lavonia Drafts, MD 06/07/19 1153

## 2019-06-07 NOTE — ED Notes (Signed)
Pt alert and oriented x4. NAD noted

## 2019-06-08 ENCOUNTER — Ambulatory Visit: Payer: Medicare Other | Admitting: Primary Care

## 2019-06-10 ENCOUNTER — Ambulatory Visit (INDEPENDENT_AMBULATORY_CARE_PROVIDER_SITE_OTHER): Payer: Medicare Other | Admitting: Internal Medicine

## 2019-06-10 ENCOUNTER — Other Ambulatory Visit: Payer: Self-pay

## 2019-06-10 ENCOUNTER — Encounter: Payer: Self-pay | Admitting: Internal Medicine

## 2019-06-10 ENCOUNTER — Telehealth: Payer: Self-pay | Admitting: Primary Care

## 2019-06-10 VITALS — BP 124/76 | HR 57 | Temp 98.4°F | Wt 125.0 lb

## 2019-06-10 DIAGNOSIS — B029 Zoster without complications: Secondary | ICD-10-CM

## 2019-06-10 NOTE — Patient Instructions (Signed)
Shingles  Shingles is an infection. It gives you a painful skin rash and blisters that have fluid in them. Shingles is caused by the same germ (virus) that causes chickenpox. Shingles only happens in people who:  Have had chickenpox.  Have been given a shot of medicine (vaccine) to protect against chickenpox. Shingles is rare in this group. The first symptoms of shingles may be itching, tingling, or pain in an area on your skin. A rash will show on your skin a few days or weeks later. The rash is likely to be on one side of your body. The rash usually has a shape like a belt or a band. Over time, the rash turns into fluid-filled blisters. The blisters will break open, change into scabs, and dry up. Medicines may:  Help with pain and itching.  Help you get better sooner.  Help to prevent long-term problems. Follow these instructions at home: Medicines  Take over-the-counter and prescription medicines only as told by your doctor.  Put on an anti-itch cream or numbing cream where you have a rash, blisters, or scabs. Do this as told by your doctor. Helping with itching and discomfort   Put cold, wet cloths (cold compresses) on the area of the rash or blisters as told by your doctor.  Cool baths can help you feel better. Try adding baking soda or dry oatmeal to the water to lessen itching. Do not bathe in hot water. Blister and rash care  Keep your rash covered with a loose bandage (dressing).  Wear loose clothing that does not rub on your rash.  Keep your rash and blisters clean. To do this, wash the area with mild soap and cool water as told by your doctor.  Check your rash every day for signs of infection. Check for: ? More redness, swelling, or pain. ? Fluid or blood. ? Warmth. ? Pus or a bad smell.  Do not scratch your rash. Do not pick at your blisters. To help you to not scratch: ? Keep your fingernails clean and cut short. ? Wear gloves or mittens when you sleep, if  scratching is a problem. General instructions  Rest as told by your doctor.  Keep all follow-up visits as told by your doctor. This is important.  Wash your hands often with soap and water. If soap and water are not available, use hand sanitizer. Doing this lowers your chance of getting a skin infection caused by germs (bacteria).  Your infection can cause chickenpox in people who have never had chickenpox or never got a shot of chickenpox vaccine. If you have blisters that did not change into scabs yet, try not to touch other people or be around other people, especially: ? Babies. ? Pregnant women. ? Children who have areas of red, itchy, or rough skin (eczema). ? Very old people who have transplants. ? People who have a long-term (chronic) sickness, like cancer or AIDS. Contact a doctor if:  Your pain does not get better with medicine.  Your pain does not get better after the rash heals.  You have any signs of infection in the rash area. These signs include: ? More redness, swelling, or pain around the rash. ? Fluid or blood coming from the rash. ? The rash area feeling warm to the touch. ? Pus or a bad smell coming from the rash. Get help right away if:  The rash is on your face or nose.  You have pain in your face or pain by   your eye.  You lose feeling on one side of your face.  You have trouble seeing.  You have ear pain, or you have ringing in your ear.  You have a loss of taste.  Your condition gets worse. Summary  Shingles gives you a painful skin rash and blisters that have fluid in them.  Shingles is an infection. It is caused by the same germ (virus) that causes chickenpox.  Keep your rash covered with a loose bandage (dressing). Wear loose clothing that does not rub on your rash.  If you have blisters that did not change into scabs yet, try not to touch other people or be around people. This information is not intended to replace advice given to you by  your health care provider. Make sure you discuss any questions you have with your health care provider. Document Released: 02/05/2008 Document Revised: 12/11/2018 Document Reviewed: 04/23/2017 Elsevier Patient Education  2020 Elsevier Inc.  

## 2019-06-10 NOTE — Progress Notes (Signed)
Subjective:    Patient ID: [name], female    DOB: Oct 14, 1935, 83 y.o.   MRN: [id number]  HPI  Pt presents to the clinic today for ER followup. She went to the ER 10/5 with c/o right side abdominal pain. She was noted to have a rash consistent with herpes zoster. She was prescribed Valtrex, Prednisone. She was advised to take Dilaudid for pain (she is prescribed this for chronic pain). Since discharge, she reports the rash seems to be spreading. She describes the pain as sharp and stabbing. She is using heat with some relief.  Review of Systems      Past Medical History:  Diagnosis Date  . Altered mental state   . Anxiety   . BMI 30.0-30.9,adult   . Cellulitis   . Chest pain, atypical   . Compression fracture of L4 lumbar vertebra   . Constipation   . Cystitis   . Cystocele   . Diverticulosis   . Fatigue   . 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 Gastroenteritis   . History of back surgery   . History of herniated intervertebral disc   . HTN (hypertension)   . Hypercholesteremia   . Hypocalcemia   . Incontinence of urine   . OA (osteoarthritis)    shoulder and back  . Obesity   . Osteopenia   . Pneumonia   . Shortness of breath   . Sleep apnea   . Thrush   . Vitamin D deficiency     Current Outpatient Medications  Medication Sig Dispense Refill  . albuterol (PROVENTIL HFA;VENTOLIN HFA) 108 (90 Base) MCG/ACT inhaler Inhale into the lungs.    Marland Kitchen CALCIUM PO Take 600 mcg by mouth daily.    . Cholecalciferol (D3 VITAMIN PO) Take 25 mcg by mouth 2 (two) times daily.    . citalopram (CELEXA) 20 MG tablet     . Cyanocobalamin (B-12) 2500 MCG TABS Take 2,500 mcg by mouth daily.    Marland Kitchen denosumab (PROLIA) 60 MG/ML SOSY injection Inject 60 mg into the skin every 6 (six) months.    . ESBRIET 267 MG TABS Take 6 tablets by mouth daily.     Marland Kitchen estradiol (ESTRACE VAGINAL) 0.1 MG/GM vaginal cream Place 1 Applicatorful vaginally once a week. (1gram weekly) 42.5 g 2  .  HYDROmorphone (DILAUDID) 4 MG tablet Take 4 mg by mouth 3 (three) times daily.     Marland Kitchen nystatin ointment (MYCOSTATIN) APPLY  OINTMENT TOPICALLY TWICE DAILY 30 g 0  . OXYGEN Inhale 2 L into the lungs as needed.    . predniSONE (DELTASONE) 20 MG tablet Take 2 tablets (40 mg total) by mouth daily for 5 days. 10 tablet 0  . valACYclovir (VALTREX) 1000 MG tablet Take 1 tablet (1,000 mg total) by mouth 3 (three) times daily for 7 days. 21 tablet 0  . VOLTAREN 1 % GEL   2   No current facility-administered medications for this visit.     No Known Allergies  Family History  Problem Relation Age of Onset  . COPD Mother   . Cancer Brother        colon    Social History   Socioeconomic History  . Marital status: Married    Spouse name: [name]  . Number of children: 2  . Years of education: Not on file  . Highest education level: Not on file  Occupational History  . Not on file  Social Needs  . Financial resource strain:  Not on file  . Food insecurity    Worry: Not on file    Inability: Not on file  . Transportation needs    Medical: Not on file    Non-medical: Not on file  Tobacco Use  . Smoking status: Never Smoker  . Smokeless tobacco: Never Used  Substance and Sexual Activity  . Alcohol use: No  . Drug use: No  . Sexual activity: Never    Birth control/protection: None  Lifestyle  . Physical activity    Days per week: Not on file    Minutes per session: Not on file  . Stress: Not on file  Relationships  . Social Herbalist on phone: Not on file    Gets together: Not on file    Attends religious service: Not on file    Active member of club or organization: Not on file    Attends meetings of clubs or organizations: Not on file    Relationship status: Not on file  . Intimate partner violence    Fear of current or ex partner: Not on file    Emotionally abused: Not on file    Physically abused: Not on file    Forced sexual activity: Not on file  Other Topics  Concern  . Not on file  Social History Narrative  . Not on file     Constitutional: Denies fever, malaise, fatigue, headache or abrupt weight changes.  Respiratory: Denies difficulty breathing, shortness of breath, cough or sputum production.   Cardiovascular: Denies chest pain, chest tightness, palpitations or swelling in the hands or feet.  Skin: Pt reports rash from mid back extending around right side of abdomen.   No other specific complaints in a complete review of systems (except as listed in HPI above).  Objective:   Physical Exam   BP 124/76   Pulse (!) 57   Temp 98.4 F (36.9 C) (Temporal)   Wt 125 lb (56.7 kg)   SpO2 96%   BMI 24.41 kg/m  Wt Readings from Last 3 Encounters:  06/10/19 125 lb (56.7 kg)  06/07/19 123 lb (55.8 kg)  06/02/19 125 lb (56.7 kg)    General: Appears her stated age, well developed, well nourished in NAD. Skin: Grouped maculopapular with grouped vesicles on erythematous base note in dermatomal pattern starting mid back extending around the right flank to midline abdomen . Neurological: Alert and oriented.    BMET    Component Value Date/Time   NA 141 10/07/2018 1619   NA 143 09/16/2018 1232   NA 139 02/09/2014 0500   K 3.8 10/07/2018 1619   K 3.2 (L) 02/09/2014 0500   CL 105 10/07/2018 1619   CL 106 02/09/2014 0500   CO2 27 10/07/2018 1619   CO2 27 02/09/2014 0500   GLUCOSE 107 (H) 10/07/2018 1619   GLUCOSE 85 02/09/2014 0500   BUN 18 10/07/2018 1619   BUN 14 09/16/2018 1232   BUN 11 02/09/2014 0500   CREATININE 1.08 (H) 10/07/2018 1619   CREATININE 0.78 02/09/2014 0500   CALCIUM 9.2 10/07/2018 1619   CALCIUM 8.8 02/09/2014 0500   GFRNONAA 48 (L) 10/07/2018 1619   GFRNONAA >60 02/09/2014 0500   GFRAA 56 (L) 10/07/2018 1619   GFRAA >60 02/09/2014 0500    Lipid Panel     Component Value Date/Time   CHOL 199 02/03/2017 0818   CHOL 156 02/09/2014 0500   TRIG 137 02/03/2017 0818   TRIG 121 02/09/2014 0500  HDL 69  02/03/2017 0818   HDL 48 02/09/2014 0500   CHOLHDL 2.9 02/03/2017 0818   VLDL 24 02/09/2014 0500   LDLCALC 103 (H) 02/03/2017 0818   LDLCALC 84 02/09/2014 0500    CBC    Component Value Date/Time   WBC 8.7 05/25/2019 1508   WBC 9.1 10/07/2018 1619   RBC 4.26 05/25/2019 1508   RBC 4.39 10/07/2018 1619   HGB 13.1 05/25/2019 1508   HCT 38.6 05/25/2019 1508   PLT 201 05/25/2019 1508   MCV 91 05/25/2019 1508   MCV 92 02/09/2014 0500   MCH 30.8 05/25/2019 1508   MCH 28.7 10/07/2018 1619   MCHC 33.9 05/25/2019 1508   MCHC 31.3 10/07/2018 1619   RDW 12.8 05/25/2019 1508   RDW 14.0 02/09/2014 0500   LYMPHSABS 1.8 05/25/2019 1508   LYMPHSABS 1.6 02/09/2014 0500   MONOABS 1.2 (H) 10/06/2018 1135   MONOABS 1.1 (H) 02/09/2014 0500   EOSABS 0.2 05/25/2019 1508   EOSABS 0.5 02/09/2014 0500   BASOSABS 0.1 05/25/2019 1508   BASOSABS 0.1 02/09/2014 0500    Hgb A1C Lab Results  Component Value Date   HGBA1C 6.1 02/09/2014           Assessment & Plan:   ER Follow Up for Herpes Zoster:  ER notes reviewed Continue Prednisone, Valtrex and Dilaudid She has had Zostavax She wants to discuss Shingrix with PCP No further intervention at this time  Return precautions discussed Webb Silversmith, NP

## 2019-06-10 NOTE — Telephone Encounter (Signed)
Patient can see another provider or we can see her on Friday

## 2019-06-10 NOTE — Telephone Encounter (Signed)
Best number [phone]  Pt called to schedule er follow up appointment  For shingles She was wanting appointment today she stated they are spreading and very painful.   Can pt be worked in or put on another provider schedule

## 2019-06-10 NOTE — Telephone Encounter (Signed)
10/8 appointment with regina Pt aware

## 2019-06-10 NOTE — Telephone Encounter (Signed)
No openings for tomorrow

## 2019-06-10 NOTE — Telephone Encounter (Signed)
Can we see if she is willing to see another provider?

## 2019-06-15 ENCOUNTER — Encounter: Payer: Medicare Other | Admitting: Obstetrics and Gynecology

## 2019-06-21 ENCOUNTER — Telehealth: Payer: Self-pay | Admitting: Primary Care

## 2019-06-21 DIAGNOSIS — B029 Zoster without complications: Secondary | ICD-10-CM

## 2019-06-21 MED ORDER — GABAPENTIN 100 MG PO CAPS: 100.0000 mg | ORAL_CAPSULE | Freq: Every day | ORAL | 0 refills | Status: DC

## 2019-06-21 NOTE — Telephone Encounter (Signed)
Spoken and notified patient of [name] comments. Patient stated that she is already taking Tylenol at night. Patient stated that she would like to try the gabapentin at bedtime. [facility] to [facility].

## 2019-06-21 NOTE — Telephone Encounter (Signed)
Patient has shingles. Patient takes her pain pills during the day.  Patient said at night she can only take aspirin.  The pain keeps her awake and she wants to know if there's anything she can put on her back and stomach for the pain. Patient can't use pain patches due to the blisters.  Patient uses [facility]. Please call patient back to let her know.

## 2019-06-21 NOTE — Telephone Encounter (Signed)
I really don't think anything topical would help. Has she tried taking Tylenol for her symptoms? This would be a good start if she's not taken anything but aspirin. If no improvement with Tylenol then we could add in a low dose gabapentin at bedtime. We have to be careful as she already takes narcotics daily. Let me know.

## 2019-06-21 NOTE — Telephone Encounter (Signed)
Noted, Rx sent to pharmacy. 

## 2019-06-29 ENCOUNTER — Telehealth: Payer: Self-pay | Admitting: Gastroenterology

## 2019-06-29 NOTE — Telephone Encounter (Signed)
Pt is calling to check on her Covid test back in September. Please call pt

## 2019-06-30 DIAGNOSIS — R0602 Shortness of breath: Secondary | ICD-10-CM | POA: Diagnosis not present

## 2019-07-05 DIAGNOSIS — R001 Bradycardia, unspecified: Secondary | ICD-10-CM | POA: Diagnosis not present

## 2019-07-05 DIAGNOSIS — E782 Mixed hyperlipidemia: Secondary | ICD-10-CM | POA: Diagnosis not present

## 2019-07-05 DIAGNOSIS — J8410 Pulmonary fibrosis, unspecified: Secondary | ICD-10-CM | POA: Diagnosis not present

## 2019-07-05 DIAGNOSIS — R0602 Shortness of breath: Secondary | ICD-10-CM | POA: Diagnosis not present

## 2019-07-05 DIAGNOSIS — G3184 Mild cognitive impairment, so stated: Secondary | ICD-10-CM | POA: Diagnosis not present

## 2019-07-05 DIAGNOSIS — G4733 Obstructive sleep apnea (adult) (pediatric): Secondary | ICD-10-CM | POA: Diagnosis not present

## 2019-07-06 ENCOUNTER — Encounter: Payer: Self-pay | Admitting: Obstetrics and Gynecology

## 2019-07-06 ENCOUNTER — Ambulatory Visit (INDEPENDENT_AMBULATORY_CARE_PROVIDER_SITE_OTHER): Payer: Medicare Other | Admitting: Obstetrics and Gynecology

## 2019-07-06 ENCOUNTER — Telehealth: Payer: Self-pay | Admitting: Gastroenterology

## 2019-07-06 ENCOUNTER — Other Ambulatory Visit: Payer: Self-pay

## 2019-07-06 VITALS — BP 135/75 | HR 73 | Ht 60.0 in | Wt 124.0 lb

## 2019-07-06 DIAGNOSIS — Z960 Presence of urogenital implants: Secondary | ICD-10-CM

## 2019-07-06 DIAGNOSIS — R32 Unspecified urinary incontinence: Secondary | ICD-10-CM | POA: Diagnosis not present

## 2019-07-06 DIAGNOSIS — N952 Postmenopausal atrophic vaginitis: Secondary | ICD-10-CM | POA: Diagnosis not present

## 2019-07-06 DIAGNOSIS — N8110 Cystocele, unspecified: Secondary | ICD-10-CM

## 2019-07-06 NOTE — Progress Notes (Signed)
    GYNECOLOGY PROGRESS NOTE  Subjective:    Patient ID: [name], female    DOB: 25-Jun-1936, 83 y.o.   MRN: [id number]  HPI  Patient is a 83 y.o. G2P2 female who presented today for a new pessary insertion (due to worsening urinary symptoms). She reports no vaginal bleeding. She does report a thin vaginal discharge but it could be urine. She denies pelvic discomfort and difficulty urinating or moving her bowels.  Notes she stopped using the Premarin cream due to worsening discharge.    The following portions of the patient's history were reviewed and updated as appropriate: allergies, current medications, past family history, past medical history, past social history, past surgical history and problem list.  Review of Systems Pertinent items noted in HPI and remainder of comprehensive ROS otherwise negative.   Objective:   Blood pressure 135/75, pulse 73, height 5' (1.524 m), weight 124 lb (56.2 kg).    General appearance: alert and no distress  Abdomen: soft, non-tender. No masses palpable or organomegaly. No suprapubic tenderness.  Pelvis: The patient's Size 3 ring with support pessary was removed, and cleaned. The new Size 3 support pessary with knob.  Speculum examination revealed mildly atrophic vaginal mucosa and urethra with no lesions. Scant thin white discharge also present, non-malodorous.   Assessment:   Pessary in situ Cystocele Vaginal atrophy Urinary incontinence  Plan:   - New pessary inserted today. To f/u in 10-12 weeks for pessary check.  - Advised to resume Premarin cream once or twice weekly and apply to urethral orifice. Can use Trimosan gel every 2 weeks for infection prevention.     Rubie Maid, MD [facility]

## 2019-07-06 NOTE — Progress Notes (Signed)
Pt is present today for pessary check. Pt stated that she is doing well no problems.

## 2019-07-06 NOTE — Telephone Encounter (Signed)
Pt left vm she states she had left a vm with Dr. Georgeann Oppenheim nurse on the 27th and never received her call she would like to have someone tell her she does not have Covid 19 after taking a test

## 2019-07-07 DIAGNOSIS — G894 Chronic pain syndrome: Secondary | ICD-10-CM | POA: Diagnosis not present

## 2019-07-07 DIAGNOSIS — M25519 Pain in unspecified shoulder: Secondary | ICD-10-CM | POA: Diagnosis not present

## 2019-07-07 DIAGNOSIS — M5416 Radiculopathy, lumbar region: Secondary | ICD-10-CM | POA: Diagnosis not present

## 2019-07-07 DIAGNOSIS — Z5181 Encounter for therapeutic drug level monitoring: Secondary | ICD-10-CM | POA: Diagnosis not present

## 2019-07-07 DIAGNOSIS — Z79899 Other long term (current) drug therapy: Secondary | ICD-10-CM | POA: Diagnosis not present

## 2019-07-07 NOTE — Telephone Encounter (Signed)
Spoke with pt and informed her that the COVID test she had on 05-31-19 was negative. Pt plans to pick up a copy of the results as she has trouble logging into her Mychart to review her results.

## 2019-07-20 ENCOUNTER — Other Ambulatory Visit: Payer: Self-pay | Admitting: Specialist

## 2019-07-20 DIAGNOSIS — J849 Interstitial pulmonary disease, unspecified: Secondary | ICD-10-CM | POA: Diagnosis not present

## 2019-07-20 DIAGNOSIS — Z9981 Dependence on supplemental oxygen: Secondary | ICD-10-CM | POA: Diagnosis not present

## 2019-07-20 DIAGNOSIS — G4733 Obstructive sleep apnea (adult) (pediatric): Secondary | ICD-10-CM | POA: Diagnosis not present

## 2019-07-20 DIAGNOSIS — R0600 Dyspnea, unspecified: Secondary | ICD-10-CM | POA: Diagnosis not present

## 2019-07-21 DIAGNOSIS — M1711 Unilateral primary osteoarthritis, right knee: Secondary | ICD-10-CM | POA: Diagnosis not present

## 2019-07-22 DIAGNOSIS — H26492 Other secondary cataract, left eye: Secondary | ICD-10-CM | POA: Diagnosis not present

## 2019-07-27 ENCOUNTER — Telehealth: Payer: Self-pay | Admitting: Obstetrics and Gynecology

## 2019-07-27 NOTE — Telephone Encounter (Signed)
The patient called and stated that her pessary is sliding out and is having some complications with it. The pt is requesting a call back as soon as possible. Please advise.

## 2019-08-03 ENCOUNTER — Other Ambulatory Visit: Payer: Self-pay

## 2019-08-03 ENCOUNTER — Ambulatory Visit
Admission: RE | Admit: 2019-08-03 | Discharge: 2019-08-03 | Disposition: A | Discharge status: Home / Self Care | Payer: Medicare Other | Source: Ambulatory Visit | Attending: Specialist | Admitting: Specialist

## 2019-08-03 DIAGNOSIS — J189 Pneumonia, unspecified organism: Secondary | ICD-10-CM | POA: Diagnosis not present

## 2019-08-03 DIAGNOSIS — J849 Interstitial pulmonary disease, unspecified: Secondary | ICD-10-CM | POA: Diagnosis not present

## 2019-08-04 DIAGNOSIS — G894 Chronic pain syndrome: Secondary | ICD-10-CM | POA: Diagnosis not present

## 2019-08-04 DIAGNOSIS — M5416 Radiculopathy, lumbar region: Secondary | ICD-10-CM | POA: Diagnosis not present

## 2019-08-04 DIAGNOSIS — Z79899 Other long term (current) drug therapy: Secondary | ICD-10-CM | POA: Diagnosis not present

## 2019-08-04 DIAGNOSIS — M25519 Pain in unspecified shoulder: Secondary | ICD-10-CM | POA: Diagnosis not present

## 2019-08-04 NOTE — Telephone Encounter (Signed)
Pt called no answer LM to call the office to speak more about her concerns.

## 2019-08-05 NOTE — Telephone Encounter (Signed)
Pt pulmonary and cardiac clearance requests have been faxed. Currently awaiting their response.

## 2019-08-10 NOTE — Telephone Encounter (Signed)
Will you please contact pt and see if she can come in earlier than 10/05/19.  Thanks [employer]

## 2019-08-11 NOTE — Telephone Encounter (Signed)
Hey I called pt and left message to call so we can resched

## 2019-08-12 ENCOUNTER — Telehealth: Payer: Self-pay | Admitting: Obstetrics and Gynecology

## 2019-08-12 NOTE — Telephone Encounter (Signed)
na

## 2019-08-17 ENCOUNTER — Ambulatory Visit (INDEPENDENT_AMBULATORY_CARE_PROVIDER_SITE_OTHER): Payer: Medicare Other | Admitting: Obstetrics and Gynecology

## 2019-08-17 ENCOUNTER — Other Ambulatory Visit: Payer: Self-pay

## 2019-08-17 ENCOUNTER — Encounter: Payer: Self-pay | Admitting: Obstetrics and Gynecology

## 2019-08-17 VITALS — BP 120/70 | HR 57 | Ht 60.0 in | Wt 122.6 lb

## 2019-08-17 DIAGNOSIS — R32 Unspecified urinary incontinence: Secondary | ICD-10-CM

## 2019-08-17 DIAGNOSIS — N8110 Cystocele, unspecified: Secondary | ICD-10-CM

## 2019-08-17 DIAGNOSIS — N952 Postmenopausal atrophic vaginitis: Secondary | ICD-10-CM

## 2019-08-17 DIAGNOSIS — Z4689 Encounter for fitting and adjustment of other specified devices: Secondary | ICD-10-CM

## 2019-08-17 NOTE — Progress Notes (Signed)
Pt is present for pessary check. Pt stated that she is having issues with her pessary.

## 2019-08-17 NOTE — Progress Notes (Signed)
    GYNECOLOGY PROGRESS NOTE  Subjective:    Patient ID: [name], female    DOB: 23-Nov-1935, 83 y.o.   MRN: [id number]  HPI  Patient is a 83 y.o. G2P2 female who presented today for a pessary check. She reports no vaginal bleeding. She notes that she is still having urinary problems (including leakage). Also feels like the pessary was trying to come out a few times (notes she could see a white tip at the introitus). Denies bowel dysfunction. Denies vaginal bleeding.    The following portions of the patient's history were reviewed and updated as appropriate: allergies, current medications, past family history, past medical history, past social history, past surgical history and problem list.   Review of Systems Pertinent items noted in HPI and remainder of comprehensive ROS otherwise negative.   Objective:   Blood pressure 120/70, pulse (!) 57, height 5' (1.524 m), weight 122 lb 9.6 oz (55.6 kg).    General appearance: alert and no distress  Abdomen: soft, non-tender. No masses palpable or organomegaly. No suprapubic tenderness.  Pelvis: The patient's Size 3 support pessary with knob was removed, and cleaned. The new Size 3.  Speculum examination revealed moderately atrophic vaginal mucosa and urethra with no lesions. Scant thin white discharge also present, non-malodorous.   Assessment:   Pessary in situ Cystocele Vaginal atrophy Urinary incontinence  Plan:   - Patient wonders if she needs to return to her previous pessary as she feels the urinary leakage has worsened since use of the current pessary. Willing to try for one more cycle now that pessary has been cleaned and reinserted.  To f/u in 10-12 weeks for pessary check.  - Advised to resume Premarin cream, increase two to three times weekly and apply to urethral orifice and introitus. Can continue to use Trimosan gel every 2 weeks for infection prevention.     Rubie Maid, MD [facility]

## 2019-08-19 ENCOUNTER — Telehealth: Payer: Self-pay

## 2019-08-19 NOTE — Telephone Encounter (Signed)
Called pt to inform her that we have now received clearance from her cardiologist and pulmonologist. Pt is cleared to proceed with rescheduling the sigmoidoscopy.  Unable to contact, LVM to return call

## 2019-08-23 ENCOUNTER — Encounter: Payer: Self-pay | Admitting: Primary Care

## 2019-08-23 ENCOUNTER — Ambulatory Visit (INDEPENDENT_AMBULATORY_CARE_PROVIDER_SITE_OTHER): Payer: Medicare Other | Admitting: Primary Care

## 2019-08-23 ENCOUNTER — Other Ambulatory Visit: Payer: Self-pay

## 2019-08-23 VITALS — BP 120/70 | HR 57 | Temp 97.9°F | Wt 122.0 lb

## 2019-08-23 DIAGNOSIS — H938X3 Other specified disorders of ear, bilateral: Secondary | ICD-10-CM | POA: Diagnosis not present

## 2019-08-23 DIAGNOSIS — K529 Noninfective gastroenteritis and colitis, unspecified: Secondary | ICD-10-CM

## 2019-08-23 MED ORDER — FLUTICASONE PROPIONATE 50 MCG/ACT NA SUSP: 1.0000 | Freq: Two times a day (BID) | NASAL | 0 refills | Status: DC

## 2019-08-23 NOTE — Patient Instructions (Signed)
Nasal Congestion/Ear Pressure/Sinus Pressure: Try using Flonase (fluticasone) nasal spray. Instill 1 spray in each nostril twice daily.   Start a daily antihistamine such as Claritin, Zyrtec, Allegra.  Please update me in one week as discussed.  It was a pleasure to see you today! [name], NP-C

## 2019-08-23 NOTE — Assessment & Plan Note (Addendum)
Symptoms suggestive of more inner ear/allergy involvement. She doesn't appear to have Covid-19, hasn't had any known exposure or been out in public recently. Discussed use of Flonase and antihistamine. She has no other symptoms and appears very well. She may have cerumen impaction but this was unable to be evaluated due to the virtual visit.  She will update in one week if symptoms persist, she will update sooner if she develops any other symptoms.

## 2019-08-23 NOTE — Telephone Encounter (Signed)
Spoke with pt and informed her that both her cardiologist and pulmonologist have cleared her to proceed with the Sigmoidoscopy procedure planned by Dr. Vicente Males. Pt states she'd like to wait until January as she has other acute medical issues she'd like to recover from. Pt has agreed to have procedure on 09-09-19. Pt opted to have the prep instructions mailed to her.

## 2019-08-23 NOTE — Progress Notes (Signed)
Subjective:    Patient ID: [name], female    DOB: 01/14/1936, 83 y.o.   MRN: [id number]  HPI  Virtual Visit via Video Note  I connected with [name] on 08/23/19 at  9:40 AM EST by a video enabled telemedicine application and verified that I am speaking with the correct person using two identifiers.  Location: Patient: Home Provider: Office   I discussed the limitations of evaluation and management by telemedicine and the availability of in person appointments. The patient expressed understanding and agreed to proceed.  History of Present Illness:  Ms. Billard is a 83 year old female with a history of sleep apnea, pulmonary fibrosis, hypertension, anxiety, hyperlipidemia who presents today with a chief complaint of ear fullness.  She feels like her head is under water, feels like her ears are full, feels like she's "talking in my head".  Symptoms began about 2 weeks ago. She denies chest pain, dizziness, headaches, ear pain, post nasal drip, cough, known exposures to Covid-19. She's not tried anything OTC for her symptoms.    Observations/Objective:  Alert and oriented. Appears well, not sickly. No distress. Speaking in complete sentences.  Assessment and Plan:  Symptoms suggestive of more inner ear/allergy involvement. She doesn't appear to have Covid-19, hasn't had any known exposure or been out in public recently. Discussed use of Flonase and antihistamine. She has no other symptoms and appears very well. She may have cerumen impaction but this was unable to be evaluated due to the virtual visit.  She will update in one week if symptoms persist, she will update sooner if she develops any other symptoms.  Follow Up Instructions:  Nasal Congestion/Ear Pressure/Sinus Pressure: Try using Flonase (fluticasone) nasal spray. Instill 1 spray in each nostril twice daily.   Start a daily antihistamine such as Claritin, Zyrtec, Allegra.  Please update me in  one week as discussed.  It was a pleasure to see you today! [name], NP-C    I discussed the assessment and treatment plan with the patient. The patient was provided an opportunity to ask questions and all were answered. The patient agreed with the plan and demonstrated an understanding of the instructions.   The patient was advised to call back or seek an in-person evaluation if the symptoms worsen or if the condition fails to improve as anticipated.    [name], NP    Review of Systems  Constitutional: Negative for chills, fatigue and fever.  HENT: Negative for ear pain, sinus pressure, sinus pain and sore throat.        Ear fullness  Eyes: Negative for visual disturbance.  Respiratory: Negative for cough and shortness of breath.   Cardiovascular: Negative for chest pain.  Neurological: Negative for dizziness and headaches.       Past Medical History:  Diagnosis Date  . Altered mental state   . Anxiety   . BMI 30.0-30.9,adult   . Cellulitis   . Chest pain, atypical   . Compression fracture of L4 lumbar vertebra   . Constipation   . Cystitis   . Cystocele   . Diverticulosis   . Fatigue   . 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 Gastroenteritis   . History of back surgery   . History of herniated intervertebral disc   . HTN (hypertension)   . Hypercholesteremia   . Hypocalcemia   . Incontinence of urine   . OA (osteoarthritis)    shoulder and back  .  Obesity   . Osteopenia   . Pneumonia   . Shortness of breath   . Sleep apnea   . Thrush   . Vitamin D deficiency      Social History   Socioeconomic History  . Marital status: Married    Spouse name: [name]  . Number of children: 2  . Years of education: Not on file  . Highest education level: Not on file  Occupational History  . Not on file  Tobacco Use  . Smoking status: Never Smoker  . Smokeless tobacco: Never Used  Substance and Sexual Activity  . Alcohol use: No  . Drug use: No    . Sexual activity: Never    Birth control/protection: None  Other Topics Concern  . Not on file  Social History Narrative  . Not on file   Social Determinants of Health   Financial Resource Strain:   . Difficulty of Paying Living Expenses: Not on file  Food Insecurity:   . Worried About Charity fundraiser in the Last Year: Not on file  . Ran Out of Food in the Last Year: Not on file  Transportation Needs:   . Lack of Transportation (Medical): Not on file  . Lack of Transportation (Non-Medical): Not on file  Physical Activity:   . Days of Exercise per Week: Not on file  . Minutes of Exercise per Session: Not on file  Stress:   . Feeling of Stress : Not on file  Social Connections:   . Frequency of Communication with Friends and Family: Not on file  . Frequency of Social Gatherings with Friends and Family: Not on file  . Attends Religious Services: Not on file  . Active Member of Clubs or Organizations: Not on file  . Attends Archivist Meetings: Not on file  . Marital Status: Not on file  Intimate Partner Violence:   . Fear of Current or Ex-Partner: Not on file  . Emotionally Abused: Not on file  . Physically Abused: Not on file  . Sexually Abused: Not on file    Past Surgical History:  Procedure Laterality Date  . CATARACT EXTRACTION W/PHACO Left 04/24/2016   Procedure: CATARACT EXTRACTION PHACO AND INTRAOCULAR LENS PLACEMENT (IOC);  Surgeon: Leandrew Koyanagi, MD;  Location: [place];  Service: Ophthalmology;  Laterality: Left;  . CATARACT EXTRACTION W/PHACO Right 07/14/2017   Procedure: CATARACT EXTRACTION PHACO AND INTRAOCULAR LENS PLACEMENT ([place])  RIGHT;  Surgeon: Leandrew Koyanagi, MD;  Location: [place];  Service: Ophthalmology;  Laterality: Right;  . ESOPHAGOGASTRODUODENOSCOPY (EGD) WITH PROPOFOL N/A 10/13/2018   Procedure: ESOPHAGOGASTRODUODENOSCOPY (EGD) WITH PROPOFOL;  Surgeon: [name], MD;  Location: [facility] ENDOSCOPY;   Service: Gastroenterology;  Laterality: N/A;  . FLEXIBLE SIGMOIDOSCOPY N/A 10/13/2018   Procedure: FLEXIBLE SIGMOIDOSCOPY;  Surgeon: [name], MD;  Location: [facility] ENDOSCOPY;  Service: Gastroenterology;  Laterality: N/A;  . ROTATOR CUFF REPAIR Bilateral    left-12/2009; right-03/2009 dr.califf  . TOTAL SHOULDER REPLACEMENT Left     Family History  Problem Relation Age of Onset  . COPD Mother   . Cancer Brother        colon    No Known Allergies  Current Outpatient Medications on File Prior to Visit  Medication Sig Dispense Refill  . CALCIUM PO Take 600 mcg by mouth daily.    . Cholecalciferol (D3 VITAMIN PO) Take 25 mcg by mouth 2 (two) times daily.    . citalopram (CELEXA) 20 MG tablet     .  Cyanocobalamin (B-12) 2500 MCG TABS Take 2,500 mcg by mouth daily.    Marland Kitchen denosumab (PROLIA) 60 MG/ML SOSY injection Inject 60 mg into the skin every 6 (six) months.    . ESBRIET 267 MG TABS Take 6 tablets by mouth daily.     Marland Kitchen estradiol (ESTRACE VAGINAL) 0.1 MG/GM vaginal cream Place 1 Applicatorful vaginally once a week. (1gram weekly) 42.5 g 2  . HYDROmorphone (DILAUDID) 4 MG tablet Take 4 mg by mouth 3 (three) times daily.     Marland Kitchen nystatin ointment (MYCOSTATIN) APPLY  OINTMENT TOPICALLY TWICE DAILY 30 g 0  . OXYGEN Inhale 2 L into the lungs as needed.    . VOLTAREN 1 % GEL   2   No current facility-administered medications on file prior to visit.    BP 120/70   Pulse (!) 57   Temp 97.9 F (36.6 C)   Wt 122 lb (55.3 kg)   BMI 23.83 kg/m    Objective:   Physical Exam  Constitutional: She is oriented to person, place, and time. She appears well-nourished. She does not have a sickly appearance.  Respiratory: Effort normal.  No cough  Neurological: She is alert and oriented to person, place, and time.  Psychiatric: She has a normal mood and affect.           Assessment & Plan:

## 2019-08-31 ENCOUNTER — Ambulatory Visit: Payer: Medicare Other | Admitting: Gastroenterology

## 2019-09-07 ENCOUNTER — Other Ambulatory Visit: Payer: Self-pay

## 2019-09-07 ENCOUNTER — Other Ambulatory Visit
Admission: RE | Admit: 2019-09-07 | Discharge: 2019-09-07 | Disposition: A | Discharge status: Home / Self Care | Payer: Medicare Other | Source: Ambulatory Visit | Attending: Gastroenterology | Admitting: Gastroenterology

## 2019-09-07 DIAGNOSIS — Z01812 Encounter for preprocedural laboratory examination: Secondary | ICD-10-CM | POA: Diagnosis not present

## 2019-09-07 DIAGNOSIS — Z20822 Contact with and (suspected) exposure to covid-19: Secondary | ICD-10-CM | POA: Insufficient documentation

## 2019-09-08 LAB — SARS CORONAVIRUS 2 (TAT 6-24 HRS): SARS Coronavirus 2: NEGATIVE

## 2019-09-09 ENCOUNTER — Ambulatory Visit: Payer: Medicare Other | Admitting: Certified Registered Nurse Anesthetist

## 2019-09-09 ENCOUNTER — Ambulatory Visit
Admission: RE | Admit: 2019-09-09 | Discharge: 2019-09-09 | Disposition: A | Discharge status: Home / Self Care | Payer: Medicare Other | Attending: Gastroenterology | Admitting: Gastroenterology

## 2019-09-09 ENCOUNTER — Other Ambulatory Visit: Payer: Self-pay

## 2019-09-09 ENCOUNTER — Encounter
Admission: RE | Disposition: A | Discharge status: Home / Self Care | Payer: Self-pay | Source: Home / Self Care | Attending: Gastroenterology

## 2019-09-09 ENCOUNTER — Encounter: Payer: Self-pay | Admitting: Gastroenterology

## 2019-09-09 DIAGNOSIS — E669 Obesity, unspecified: Secondary | ICD-10-CM | POA: Insufficient documentation

## 2019-09-09 DIAGNOSIS — G4730 Sleep apnea, unspecified: Secondary | ICD-10-CM | POA: Diagnosis not present

## 2019-09-09 DIAGNOSIS — E559 Vitamin D deficiency, unspecified: Secondary | ICD-10-CM | POA: Insufficient documentation

## 2019-09-09 DIAGNOSIS — I10 Essential (primary) hypertension: Secondary | ICD-10-CM | POA: Insufficient documentation

## 2019-09-09 DIAGNOSIS — K529 Noninfective gastroenteritis and colitis, unspecified: Secondary | ICD-10-CM

## 2019-09-09 DIAGNOSIS — M8580 Other specified disorders of bone density and structure, unspecified site: Secondary | ICD-10-CM | POA: Diagnosis not present

## 2019-09-09 DIAGNOSIS — Z791 Long term (current) use of non-steroidal anti-inflammatories (NSAID): Secondary | ICD-10-CM | POA: Diagnosis not present

## 2019-09-09 DIAGNOSIS — K5190 Ulcerative colitis, unspecified, without complications: Secondary | ICD-10-CM | POA: Diagnosis not present

## 2019-09-09 DIAGNOSIS — E7800 Pure hypercholesterolemia, unspecified: Secondary | ICD-10-CM | POA: Insufficient documentation

## 2019-09-09 DIAGNOSIS — Z79899 Other long term (current) drug therapy: Secondary | ICD-10-CM | POA: Insufficient documentation

## 2019-09-09 DIAGNOSIS — F419 Anxiety disorder, unspecified: Secondary | ICD-10-CM | POA: Diagnosis not present

## 2019-09-09 DIAGNOSIS — M1990 Unspecified osteoarthritis, unspecified site: Secondary | ICD-10-CM | POA: Diagnosis not present

## 2019-09-09 DIAGNOSIS — Z1211 Encounter for screening for malignant neoplasm of colon: Secondary | ICD-10-CM | POA: Diagnosis not present

## 2019-09-09 DIAGNOSIS — K5790 Diverticulosis of intestine, part unspecified, without perforation or abscess without bleeding: Secondary | ICD-10-CM | POA: Diagnosis not present

## 2019-09-09 HISTORY — PX: FLEXIBLE SIGMOIDOSCOPY: SHX5431

## 2019-09-09 SURGERY — SIGMOIDOSCOPY, FLEXIBLE
Anesthesia: General

## 2019-09-09 MED ORDER — PROPOFOL 500 MG/50ML IV EMUL
INTRAVENOUS | Status: DC | PRN
  Administered 2019-09-09: 100 ug/kg/min via INTRAVENOUS

## 2019-09-09 MED ORDER — SODIUM CHLORIDE 0.9 % IV SOLN: INTRAVENOUS | Status: DC

## 2019-09-09 MED ORDER — PROPOFOL 10 MG/ML IV BOLUS
INTRAVENOUS | Status: DC | PRN
  Administered 2019-09-09: 50 mg via INTRAVENOUS

## 2019-09-09 MED ORDER — PROPOFOL 10 MG/ML IV BOLUS
INTRAVENOUS | Status: AC
  Filled 2019-09-09: qty 20

## 2019-09-09 NOTE — Op Note (Signed)
[facility] Gastroenterology Patient Name: [name] Procedure Date: 09/09/2019 9:47 AM MRN: [id number] Account #: [id number] Date of Birth: [birth year] Admit Type: Outpatient Age: 84 Room: [facility] ENDO ROOM 4 Gender: Female Note Status: Finalized Procedure:             Flexible Sigmoidoscopy Indications:           Follow-up of ulcerative colitis Providers:             [name] MD, MD Referring MD:          [name] (Referring MD) Medicines:             Monitored Anesthesia Care Complications:         No immediate complications. Procedure:             Pre-Anesthesia Assessment:                        - Prior to the procedure, a History and Physical was                         performed, and patient medications, allergies and                         sensitivities were reviewed. The patient's tolerance                         of previous anesthesia was reviewed.                        - The risks and benefits of the procedure and the                         sedation options and risks were discussed with the                         patient. All questions were answered and informed                         consent was obtained.                        - ASA Grade Assessment: II - A patient with mild                         systemic disease.                        After obtaining informed consent, the scope was passed                         under direct vision. The Colonoscope was introduced                         through the anus and advanced to the the left                         transverse colon. The flexible sigmoidoscopy was                         accomplished with ease.  The patient tolerated the                         procedure well. The quality of the bowel preparation                         was adequate. Findings:      The perianal and digital rectal examinations were normal.      A single (solitary) six mm ulcer was found in the proximal  descending       colon. No bleeding was present. No stigmata of recent bleeding were       seen. Biopsies were taken with a cold forceps for histology.      The exam was otherwise without abnormality. Impression:            - A single (solitary) ulcer in the proximal descending                         colon. Biopsied.                        - The examination was otherwise normal. Recommendation:        - Discharge patient to home (with escort).                        - Resume previous diet.                        - Await pathology results.                        - Return to my office as previously scheduled. Procedure Code(s):     --- Professional ---                        [id number], Sigmoidoscopy, flexible; with biopsy, single or                         multiple Diagnosis Code(s):     --- Professional ---                        K63.3, Ulcer of intestine                        K51.90, Ulcerative colitis, unspecified, without                         complications CPT copyright 2019 American Medical Association. All rights reserved. The codes documented in this report are preliminary and upon coder review may  be revised to meet current compliance requirements. [name], MD [name] MD, MD 09/09/2019 10:01:26 AM This report has been signed electronically. Number of Addenda: 0 Note Initiated On: 09/09/2019 9:47 AM Total Procedure Duration: 0 hours 6 minutes 22 seconds  Estimated Blood Loss:  Estimated blood loss: none.      [facility]

## 2019-09-09 NOTE — H&P (Signed)
[name], MD [address] [place], [place], [place], [address] [address], [place], [place], [place], [address] Phone: [phone]  Fax: [phone]  Primary Care Physician:  [name], NP   Pre-Procedure History & Physical: HPI:  [name] is a 84 y.o. female is here for asigmoidoscopy    Past Medical History:  Diagnosis Date  . Altered mental state   . Anxiety   . BMI 30.0-30.9,adult   . Cellulitis   . Chest pain, atypical   . Compression fracture of L4 lumbar vertebra   . Constipation   . Cystitis   . Cystocele   . Diverticulosis   . Fatigue   . 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 Gastroenteritis   . History of back surgery   . History of herniated intervertebral disc   . HTN (hypertension)   . Hypercholesteremia   . Hypocalcemia   . Incontinence of urine   . OA (osteoarthritis)    shoulder and back  . Obesity   . Osteopenia   . Pneumonia   . Shortness of breath   . Sleep apnea   . Thrush   . Vitamin D deficiency     Past Surgical History:  Procedure Laterality Date  . BACK SURGERY    . CATARACT EXTRACTION W/PHACO Left 04/24/2016   Procedure: CATARACT EXTRACTION PHACO AND INTRAOCULAR LENS PLACEMENT (IOC);  Surgeon: Leandrew Koyanagi, MD;  Location: [place];  Service: Ophthalmology;  Laterality: Left;  . CATARACT EXTRACTION W/PHACO Right 07/14/2017   Procedure: CATARACT EXTRACTION PHACO AND INTRAOCULAR LENS PLACEMENT ([place])  RIGHT;  Surgeon: Leandrew Koyanagi, MD;  Location: [place];  Service: Ophthalmology;  Laterality: Right;  . CHOLECYSTECTOMY    . ESOPHAGOGASTRODUODENOSCOPY (EGD) WITH PROPOFOL N/A 10/13/2018   Procedure: ESOPHAGOGASTRODUODENOSCOPY (EGD) WITH PROPOFOL;  Surgeon: [name], MD;  Location: [facility] ENDOSCOPY;  Service: Gastroenterology;  Laterality: N/A;  . FLEXIBLE SIGMOIDOSCOPY N/A 10/13/2018   Procedure: FLEXIBLE SIGMOIDOSCOPY;  Surgeon: [name], MD;  Location: [facility]  ENDOSCOPY;  Service: Gastroenterology;  Laterality: N/A;  . ROTATOR CUFF REPAIR Bilateral    left-12/2009; right-03/2009 dr.califf  . TONSILLECTOMY    . TOTAL SHOULDER REPLACEMENT Left     Prior to Admission medications   Medication Sig Start Date End Date Taking? Authorizing Provider  citalopram (CELEXA) 20 MG tablet  07/31/18  Yes [provider]  ESBRIET 267 MG TABS Take 6 tablets by mouth daily.  02/25/17  Yes [provider]  estradiol (ESTRACE VAGINAL) 0.1 MG/GM vaginal cream Place 1 Applicatorful vaginally once a week. (1gram weekly) 06/16/18  Yes Rubie Maid, MD  HYDROmorphone (DILAUDID) 4 MG tablet Take 4 mg by mouth 3 (three) times daily.  04/23/19  Yes [provider]  OXYGEN Inhale 2 L into the lungs as needed.   Yes [provider]  CALCIUM PO Take 600 mcg by mouth daily.    [provider]  Cholecalciferol (D3 VITAMIN PO) Take 25 mcg by mouth 2 (two) times daily.    [provider]  Cyanocobalamin (B-12) 2500 MCG TABS Take 2,500 mcg by mouth daily.    [provider]  denosumab (PROLIA) 60 MG/ML SOSY injection Inject 60 mg into the skin every 6 (six) months.    [provider]  fluticasone (FLONASE) 50 MCG/ACT nasal spray Place 1 spray into both nostrils 2 (two) times daily. 08/23/19   [name], NP  nystatin ointment (MYCOSTATIN) APPLY  OINTMENT TOPICALLY TWICE DAILY  12/02/18   Rubie Maid, MD  VOLTAREN 1 % GEL  12/23/17   [provider]    Allergies as of 08/25/2019  . (No Known Allergies)    Family History  Problem Relation Age of Onset  . COPD Mother   . Cancer Brother        colon    Social History   Socioeconomic History  . Marital status: Married    Spouse name: [name]  . Number of children: 2  . Years of education: Not on file  . Highest education level: Not on file  Occupational History  . Not on file  Tobacco Use  . Smoking status: Never Smoker  . Smokeless  tobacco: Never Used  Substance and Sexual Activity  . Alcohol use: No  . Drug use: No  . Sexual activity: Never    Birth control/protection: None  Other Topics Concern  . Not on file  Social History Narrative  . Not on file   Social Determinants of Health   Financial Resource Strain:   . Difficulty of Paying Living Expenses: Not on file  Food Insecurity:   . Worried About Charity fundraiser in the Last Year: Not on file  . Ran Out of Food in the Last Year: Not on file  Transportation Needs:   . Lack of Transportation (Medical): Not on file  . Lack of Transportation (Non-Medical): Not on file  Physical Activity:   . Days of Exercise per Week: Not on file  . Minutes of Exercise per Session: Not on file  Stress:   . Feeling of Stress : Not on file  Social Connections:   . Frequency of Communication with Friends and Family: Not on file  . Frequency of Social Gatherings with Friends and Family: Not on file  . Attends Religious Services: Not on file  . Active Member of Clubs or Organizations: Not on file  . Attends Archivist Meetings: Not on file  . Marital Status: Not on file  Intimate Partner Violence:   . Fear of Current or Ex-Partner: Not on file  . Emotionally Abused: Not on file  . Physically Abused: Not on file  . Sexually Abused: Not on file    Review of Systems: See HPI, otherwise negative ROS  Physical Exam: BP (!) 157/72   Pulse 66   Temp (!) 97.1 F (36.2 C) (Tympanic)   Resp 18   SpO2 100%  General:   Alert,  pleasant and cooperative in NAD Head:  Normocephalic and atraumatic. Neck:  Supple; no masses or thyromegaly. Lungs:  Clear throughout to auscultation, normal respiratory effort.    Heart:  +S1, +S2, Regular rate and rhythm, No edema. Abdomen:  Soft, nontender and nondistended. Normal bowel sounds, without guarding, and without rebound.   Neurologic:  Alert and  oriented x4;  grossly normal neurologically.  Impression/Plan: [name] is here for a sigmoidoscopy o be performed for evaluation of colitis. Risks, benefits, limitations, and alternatives regarding  colonoscopy have been reviewed with the patient.  Questions have been answered.  All parties agreeable.   [name], MD  09/09/2019, 9:41 AM

## 2019-09-09 NOTE — Anesthesia Postprocedure Evaluation (Signed)
Anesthesia Post Note  Patient: [name]  Procedure(s) Performed: FLEXIBLE SIGMOIDOSCOPY (N/A )  Patient location during evaluation: Endoscopy Anesthesia Type: General Level of consciousness: awake and alert Pain management: pain level controlled Vital Signs Assessment: post-procedure vital signs reviewed and stable Respiratory status: spontaneous breathing, nonlabored ventilation, respiratory function stable and patient connected to nasal cannula oxygen Cardiovascular status: blood pressure returned to baseline and stable Postop Assessment: no apparent nausea or vomiting Anesthetic complications: no     Last Vitals:  Vitals:   09/09/19 0836 09/09/19 1004  BP: (!) 157/72 129/60  Pulse: 66   Resp: 18   Temp: (!) 36.2 C (!) 36 C  SpO2: 100%     Last Pain:  Vitals:   09/09/19 1024  TempSrc:   PainSc: 0-No pain                 Precious Haws Kassia Demarinis

## 2019-09-09 NOTE — Transfer of Care (Signed)
Immediate Anesthesia Transfer of Care Note  Patient: [name]  Procedure(s) Performed: FLEXIBLE SIGMOIDOSCOPY (N/A )  Patient Location: PACU  Anesthesia Type:General  Level of Consciousness: drowsy  Airway & Oxygen Therapy: Patient Spontanous Breathing and Patient connected to nasal cannula oxygen  Post-op Assessment: Report given to RN and Post -op Vital signs reviewed and stable  Post vital signs: Reviewed and stable  Last Vitals:  Vitals Value Taken Time  BP    Temp    Pulse 56 09/09/19 1003  Resp 20 09/09/19 1003  SpO2 100 % 09/09/19 1003  Vitals shown include unvalidated device data.  Last Pain:  Vitals:   09/09/19 0836  TempSrc: Tympanic  PainSc: 0-No pain         Complications: No apparent anesthesia complications

## 2019-09-09 NOTE — Anesthesia Preprocedure Evaluation (Signed)
Anesthesia Evaluation  Patient identified by MRN, date of birth, ID band Patient awake    Reviewed: Allergy & Precautions, H&P , NPO status , Patient's Chart, lab work & pertinent test results  History of Anesthesia Complications Negative for: history of anesthetic complications  Airway Mallampati: III  TM Distance: <3 FB Neck ROM: limited    Dental  (+) Chipped, Poor Dentition   Pulmonary shortness of breath and Long-Term Oxygen Therapy, sleep apnea , pneumonia,           Cardiovascular Exercise Tolerance: Good hypertension, (-) angina(-) Past MI and (-) DOE      Neuro/Psych negative neurological ROS     GI/Hepatic negative GI ROS, Neg liver ROS, neg GERD  ,  Endo/Other  negative endocrine ROS  Renal/GU negative Renal ROS  negative genitourinary   Musculoskeletal  (+) Arthritis ,   Abdominal   Peds  Hematology negative hematology ROS (+)   Anesthesia Other Findings Past Medical History: No date: Altered mental state No date: Anxiety No date: BMI 30.0-30.9,adult No date: Cellulitis No date: Chest pain, atypical No date: Compression fracture of L4 lumbar vertebra No date: Constipation No date: Cystitis No date: Cystocele No date: Diverticulosis No date: Fatigue No date: Fibrosis, idiopathic pulmonary (HCC) No date: Gastroenteritis No date: History of back surgery No date: History of herniated intervertebral disc No date: HTN (hypertension) No date: Hypercholesteremia No date: Hypocalcemia No date: Incontinence of urine No date: OA (osteoarthritis)     Comment:  shoulder and back No date: Obesity No date: Osteopenia No date: Pneumonia No date: Shortness of breath No date: Sleep apnea No date: Thrush No date: Vitamin D deficiency  Past Surgical History: No date: BACK SURGERY 04/24/2016: CATARACT EXTRACTION W/PHACO; Left     Comment:  Procedure: CATARACT EXTRACTION PHACO AND INTRAOCULAR                LENS PLACEMENT (IOC);  Surgeon: Leandrew Koyanagi, MD;               Location: [place];  Service: Ophthalmology;                Laterality: Left; 07/14/2017: CATARACT EXTRACTION W/PHACO; Right     Comment:  Procedure: CATARACT EXTRACTION PHACO AND INTRAOCULAR               LENS PLACEMENT ([place])  RIGHT;  Surgeon: Leandrew Koyanagi, MD;  Location: [place];  Service:               Ophthalmology;  Laterality: Right; No date: CHOLECYSTECTOMY 10/13/2018: ESOPHAGOGASTRODUODENOSCOPY (EGD) WITH PROPOFOL; N/A     Comment:  Procedure: ESOPHAGOGASTRODUODENOSCOPY (EGD) WITH               PROPOFOL;  Surgeon: [name], MD;  Location: [facility]               ENDOSCOPY;  Service: Gastroenterology;  Laterality: N/A; 10/13/2018: FLEXIBLE SIGMOIDOSCOPY; N/A     Comment:  Procedure: FLEXIBLE SIGMOIDOSCOPY;  Surgeon: [name], MD;  Location: [facility] ENDOSCOPY;  Service:               Gastroenterology;  Laterality: N/A; No date: ROTATOR CUFF REPAIR; Bilateral     Comment:  left-12/2009; right-03/2009 dr.califf No date: TONSILLECTOMY No date: TOTAL SHOULDER REPLACEMENT; Left     Reproductive/Obstetrics  negative OB ROS                             Anesthesia Physical Anesthesia Plan  ASA: IV  Anesthesia Plan: General   Post-op Pain Management:    Induction: Intravenous  PONV Risk Score and Plan: Propofol infusion and TIVA  Airway Management Planned: Natural Airway and Nasal Cannula  Additional Equipment:   Intra-op Plan:   Post-operative Plan:   Informed Consent: I have reviewed the patients History and Physical, chart, labs and discussed the procedure including the risks, benefits and alternatives for the proposed anesthesia with the patient or authorized representative who has indicated his/her understanding and acceptance.     Dental Advisory Given  Plan Discussed with: Anesthesiologist, CRNA and  Surgeon  Anesthesia Plan Comments: (Patient consented for risks of anesthesia including but not limited to:  - adverse reactions to medications - risk of intubation if required - damage to teeth, lips or other oral mucosa - sore throat or hoarseness - Damage to heart, brain, lungs or loss of life  Patient voiced understanding.)        Anesthesia Quick Evaluation

## 2019-09-10 LAB — SURGICAL PATHOLOGY

## 2019-09-10 MED ORDER — CITALOPRAM HYDROBROMIDE 20 MG PO TABS: 20.0000 mg | ORAL_TABLET | Freq: Every day | ORAL | 0 refills | Status: DC

## 2019-09-13 ENCOUNTER — Encounter: Payer: Self-pay | Admitting: Primary Care

## 2019-09-13 ENCOUNTER — Other Ambulatory Visit: Payer: Self-pay

## 2019-09-13 ENCOUNTER — Ambulatory Visit (INDEPENDENT_AMBULATORY_CARE_PROVIDER_SITE_OTHER): Payer: Medicare Other | Admitting: Primary Care

## 2019-09-13 VITALS — BP 120/78 | HR 54 | Temp 96.2°F | Ht 60.0 in | Wt 124.2 lb

## 2019-09-13 DIAGNOSIS — J8410 Pulmonary fibrosis, unspecified: Secondary | ICD-10-CM

## 2019-09-13 DIAGNOSIS — M1990 Unspecified osteoarthritis, unspecified site: Secondary | ICD-10-CM | POA: Diagnosis not present

## 2019-09-13 DIAGNOSIS — I10 Essential (primary) hypertension: Secondary | ICD-10-CM | POA: Diagnosis not present

## 2019-09-13 DIAGNOSIS — E559 Vitamin D deficiency, unspecified: Secondary | ICD-10-CM

## 2019-09-13 DIAGNOSIS — F419 Anxiety disorder, unspecified: Secondary | ICD-10-CM

## 2019-09-13 DIAGNOSIS — Q782 Osteopetrosis: Secondary | ICD-10-CM

## 2019-09-13 DIAGNOSIS — Z23 Encounter for immunization: Secondary | ICD-10-CM

## 2019-09-13 DIAGNOSIS — E7800 Pure hypercholesterolemia, unspecified: Secondary | ICD-10-CM | POA: Diagnosis not present

## 2019-09-13 LAB — COMPREHENSIVE METABOLIC PANEL
ALT: 15 U/L (ref 0–35)
AST: 26 U/L (ref 0–37)
Albumin: 3.9 g/dL (ref 3.5–5.2)
Alkaline Phosphatase: 84 U/L (ref 39–117)
BUN: 15 mg/dL (ref 6–23)
CO2: 29 mEq/L (ref 19–32)
Calcium: 9.5 mg/dL (ref 8.4–10.5)
Chloride: 106 mEq/L (ref 96–112)
Creatinine, Ser: 0.76 mg/dL (ref 0.40–1.20)
GFR: 72.98 mL/min (ref >60.00)
Glucose, Bld: 90 mg/dL (ref 70–99)
Potassium: 5.1 mEq/L (ref 3.5–5.1)
Sodium: 140 mEq/L (ref 135–145)
Total Bilirubin: 0.7 mg/dL (ref 0.2–1.2)
Total Protein: 7.1 g/dL (ref 6.0–8.3)

## 2019-09-13 LAB — CBC
HCT: 41.4 % (ref 36.0–46.0)
Hemoglobin: 13.7 g/dL (ref 12.0–15.0)
MCHC: 33 g/dL (ref 30.0–36.0)
MCV: 95 fl (ref 78.0–100.0)
Platelets: 224 10*3/uL (ref 150.0–400.0)
RBC: 4.36 Mil/uL (ref 3.87–5.11)
RDW: 14.1 % (ref 11.5–15.5)
WBC: 9.8 10*3/uL (ref 4.0–10.5)

## 2019-09-13 LAB — LIPID PANEL
Cholesterol: 195 mg/dL (ref 0–200)
HDL: 77.9 mg/dL (ref >39.00)
LDL Cholesterol: 99 mg/dL (ref 0–99)
NonHDL: 117.33
Total CHOL/HDL Ratio: 3
Triglycerides: 94 mg/dL (ref 0.0–149.0)
VLDL: 18.8 mg/dL (ref 0.0–40.0)

## 2019-09-13 LAB — VITAMIN D 25 HYDROXY (VIT D DEFICIENCY, FRACTURES): VITD: 33.94 ng/mL (ref 30.00–100.00)

## 2019-09-13 MED ORDER — ZOSTER VAC RECOMB ADJUVANTED 50 MCG/0.5ML IM SUSR: 0.5000 mL | Freq: Once | INTRAMUSCULAR | 1 refills | Status: AC

## 2019-09-13 NOTE — Assessment & Plan Note (Signed)
Stable in the office today, doing well off meds. Continue to monitor.

## 2019-09-13 NOTE — Assessment & Plan Note (Signed)
Following with pulmonology, compliant to current regimen, continue same.

## 2019-09-13 NOTE — Patient Instructions (Addendum)
Call [phone] for the Covid Vaccine. You can always check the [facility] Web site for scheduling.  Take the Shingles vaccine to your pharmacy. Wait one month after your last Covid vaccine before you get this.  Stop by the lab prior to leaving today. I will notify you of your results once received.   It was a pleasure to see you today!

## 2019-09-13 NOTE — Assessment & Plan Note (Signed)
Chronic back pain, following with orthopedics who also prescribes hydromorphone. Continue same.

## 2019-09-13 NOTE — Assessment & Plan Note (Signed)
Repeat vitamin D pending.

## 2019-09-13 NOTE — Assessment & Plan Note (Signed)
Doing well on Prolia injections per orthopedics. Continue same. Bone density scan from 2020 reviewed.

## 2019-09-13 NOTE — Progress Notes (Signed)
Subjective:    Patient ID: [name], female    DOB: 1936/07/18, 84 y.o.   MRN: [id number]  HPI  This visit occurred during the SARS-CoV-2 public health emergency.  Safety protocols were in place, including screening questions prior to the visit, additional usage of staff PPE, and extensive cleaning of exam room while observing appropriate contact time as indicated for disinfecting solutions.   [name] is a 84 year old female who presents today for follow up of chronic conditions. She is also interested in the Shingrix vaccine.  Currently managed on citalopram 20 mg daily for anxiety. Historically she's done well on citalopram and has no concerns today.  Currently managed on Esbriet tablets and supplemental oxygen at night, following with pulmonology and doing well overall.   Managed on Prolia injections per orthopedics in [place], last bone density scan completed in January 2020. Continues to be managed on hydromorphone per [facility] in [place], will be undergoing nerve procedure soon.   Managed on Estrace cream for vaginal atrophy symptoms, recently had a dose increase to 2-3 times weekly per GYN. Overall she hasn't noticed much of a difference but just started the dose increase.   BP Readings from Last 3 Encounters:  09/13/19 120/78  09/09/19 129/60  08/23/19 120/70     Review of Systems  Constitutional: Negative for fever.  Eyes: Negative for visual disturbance.  Respiratory: Negative for cough.   Cardiovascular: Negative for chest pain.  Musculoskeletal: Positive for arthralgias and back pain.  Neurological: Negative for dizziness.       Past Medical History:  Diagnosis Date  . Altered mental state   . Anxiety   . BMI 30.0-30.9,adult   . Cellulitis   . Chest pain, atypical   . Compression fracture of L4 lumbar vertebra   . Constipation   . Cystitis   . Cystocele   . Diverticulosis   . Fatigue   . Fibrosis, idiopathic pulmonary ([place])   .  Gastroenteritis   . History of back surgery   . History of herniated intervertebral disc   . HTN (hypertension)   . Hypercholesteremia   . Hypocalcemia   . Incontinence of urine   . OA (osteoarthritis)    shoulder and back  . Obesity   . Osteopenia   . Pneumonia   . Shortness of breath   . Sleep apnea   . Thrush   . Vitamin D deficiency      Social History   Socioeconomic History  . Marital status: Married    Spouse name: [name]  . Number of children: 2  . Years of education: Not on file  . Highest education level: Not on file  Occupational History  . Not on file  Tobacco Use  . Smoking status: Never Smoker  . Smokeless tobacco: Never Used  Substance and Sexual Activity  . Alcohol use: No  . Drug use: No  . Sexual activity: Never    Birth control/protection: None  Other Topics Concern  . Not on file  Social History Narrative  . Not on file   Social Determinants of Health   Financial Resource Strain:   . Difficulty of Paying Living Expenses: Not on file  Food Insecurity:   . Worried About Charity fundraiser in the Last Year: Not on file  . Ran Out of Food in the Last Year: Not on file  Transportation Needs:   . Lack of Transportation (Medical): Not on file  . Lack of  Transportation (Non-Medical): Not on file  Physical Activity:   . Days of Exercise per Week: Not on file  . Minutes of Exercise per Session: Not on file  Stress:   . Feeling of Stress : Not on file  Social Connections:   . Frequency of Communication with Friends and Family: Not on file  . Frequency of Social Gatherings with Friends and Family: Not on file  . Attends Religious Services: Not on file  . Active Member of Clubs or Organizations: Not on file  . Attends Archivist Meetings: Not on file  . Marital Status: Not on file  Intimate Partner Violence:   . Fear of Current or Ex-Partner: Not on file  . Emotionally Abused: Not on file  . Physically Abused: Not on file  .  Sexually Abused: Not on file    Past Surgical History:  Procedure Laterality Date  . BACK SURGERY    . CATARACT EXTRACTION W/PHACO Left 04/24/2016   Procedure: CATARACT EXTRACTION PHACO AND INTRAOCULAR LENS PLACEMENT (IOC);  Surgeon: Leandrew Koyanagi, MD;  Location: [place];  Service: Ophthalmology;  Laterality: Left;  . CATARACT EXTRACTION W/PHACO Right 07/14/2017   Procedure: CATARACT EXTRACTION PHACO AND INTRAOCULAR LENS PLACEMENT ([place])  RIGHT;  Surgeon: Leandrew Koyanagi, MD;  Location: [place];  Service: Ophthalmology;  Laterality: Right;  . CHOLECYSTECTOMY    . ESOPHAGOGASTRODUODENOSCOPY (EGD) WITH PROPOFOL N/A 10/13/2018   Procedure: ESOPHAGOGASTRODUODENOSCOPY (EGD) WITH PROPOFOL;  Surgeon: [name], MD;  Location: [facility] ENDOSCOPY;  Service: Gastroenterology;  Laterality: N/A;  . FLEXIBLE SIGMOIDOSCOPY N/A 10/13/2018   Procedure: FLEXIBLE SIGMOIDOSCOPY;  Surgeon: [name], MD;  Location: [facility] ENDOSCOPY;  Service: Gastroenterology;  Laterality: N/A;  . FLEXIBLE SIGMOIDOSCOPY N/A 09/09/2019   Procedure: FLEXIBLE SIGMOIDOSCOPY;  Surgeon: [name], MD;  Location: [facility] ENDOSCOPY;  Service: Gastroenterology;  Laterality: N/A;  . ROTATOR CUFF REPAIR Bilateral    left-12/2009; right-03/2009 dr.califf  . TONSILLECTOMY    . TOTAL SHOULDER REPLACEMENT Left     Family History  Problem Relation Age of Onset  . COPD Mother   . Cancer Brother        colon    No Known Allergies  Current Outpatient Medications on File Prior to Visit  Medication Sig Dispense Refill  . CALCIUM PO Take 600 mcg by mouth daily.    . Cholecalciferol (D3 VITAMIN PO) Take 25 mcg by mouth 2 (two) times daily.    . citalopram (CELEXA) 20 MG tablet Take 1 tablet (20 mg total) by mouth daily. 90 tablet 0  . Cyanocobalamin (B-12) 2500 MCG TABS Take 2,500 mcg by mouth daily.    Marland Kitchen denosumab (PROLIA) 60 MG/ML SOSY injection Inject 60 mg into the skin every 6 (six) months.    . ESBRIET 267  MG TABS Take 6 tablets by mouth daily.     Marland Kitchen estradiol (ESTRACE VAGINAL) 0.1 MG/GM vaginal cream Place 1 Applicatorful vaginally once a week. (1gram weekly) 42.5 g 2  . fluticasone (FLONASE) 50 MCG/ACT nasal spray Place 1 spray into both nostrils 2 (two) times daily. 16 g 0  . HYDROmorphone (DILAUDID) 4 MG tablet Take 4 mg by mouth 3 (three) times daily.     Marland Kitchen nystatin ointment (MYCOSTATIN) APPLY  OINTMENT TOPICALLY TWICE DAILY 30 g 0  . OXYGEN Inhale 2 L into the lungs as needed.    . VOLTAREN 1 % GEL   2   No current facility-administered medications on file prior to visit.    BP 120/78  Pulse (!) 54   Temp (!) 96.2 F (35.7 C) (Temporal)   Ht 5' (1.524 m)   Wt 124 lb 4 oz (56.4 kg)   SpO2 92%   BMI 24.27 kg/m    Objective:   Physical Exam  Constitutional: She appears well-nourished.  Cardiovascular: Normal rate and regular rhythm.  Respiratory: Effort normal and breath sounds normal.  Musculoskeletal:     Cervical back: Neck supple.  Skin: Skin is warm and dry.  Psychiatric: She has a normal mood and affect.           Assessment & Plan:

## 2019-09-13 NOTE — Assessment & Plan Note (Signed)
Doing well on citalopram 20 mg, continue same. Denies SI/HI.

## 2019-09-13 NOTE — Assessment & Plan Note (Signed)
Repeat lipids pending.  Not currently on statin therapy.

## 2019-09-14 ENCOUNTER — Other Ambulatory Visit: Payer: Self-pay | Admitting: Primary Care

## 2019-09-14 DIAGNOSIS — H938X3 Other specified disorders of ear, bilateral: Secondary | ICD-10-CM

## 2019-09-16 DIAGNOSIS — Z96612 Presence of left artificial shoulder joint: Secondary | ICD-10-CM | POA: Diagnosis not present

## 2019-09-29 DIAGNOSIS — Z01812 Encounter for preprocedural laboratory examination: Secondary | ICD-10-CM | POA: Diagnosis not present

## 2019-09-29 DIAGNOSIS — Z20822 Contact with and (suspected) exposure to covid-19: Secondary | ICD-10-CM | POA: Diagnosis not present

## 2019-09-30 ENCOUNTER — Other Ambulatory Visit: Payer: Self-pay | Admitting: Primary Care

## 2019-09-30 DIAGNOSIS — H938X3 Other specified disorders of ear, bilateral: Secondary | ICD-10-CM

## 2019-10-01 DIAGNOSIS — M5416 Radiculopathy, lumbar region: Secondary | ICD-10-CM | POA: Diagnosis not present

## 2019-10-01 DIAGNOSIS — J8410 Pulmonary fibrosis, unspecified: Secondary | ICD-10-CM | POA: Diagnosis not present

## 2019-10-01 DIAGNOSIS — Z9981 Dependence on supplemental oxygen: Secondary | ICD-10-CM | POA: Diagnosis not present

## 2019-10-03 ENCOUNTER — Encounter: Payer: Self-pay | Admitting: Gastroenterology

## 2019-10-05 ENCOUNTER — Encounter: Payer: Medicare Other | Admitting: Obstetrics and Gynecology

## 2019-10-05 ENCOUNTER — Telehealth: Payer: Self-pay

## 2019-10-05 ENCOUNTER — Telehealth: Payer: Self-pay | Admitting: Gastroenterology

## 2019-10-05 DIAGNOSIS — H26492 Other secondary cataract, left eye: Secondary | ICD-10-CM | POA: Diagnosis not present

## 2019-10-05 NOTE — Telephone Encounter (Signed)
Pt left vm for [employer]

## 2019-10-05 NOTE — Telephone Encounter (Signed)
Patient called back and patient states she can not make a telephone visit on 10/15/2019. The only day she could do it is 10/12/2019. Made appointment 10/12/2019 at 1

## 2019-10-05 NOTE — Telephone Encounter (Signed)
-----   Message from [name], MD sent at 10/03/2019 12:52 PM EST ----- Sherald Hess please inform biopsies show some inflammation - would like a telephone visit with her to discuss what we could or can do down the road and see what she feels  C./c Carlis Abbott, Leticia Penna, NP   Dr [name] MD,MRCP [facility]) Gastroenterology/Hepatology Pager: [phone]

## 2019-10-05 NOTE — Telephone Encounter (Signed)
Called and left a message for call back  

## 2019-10-05 NOTE — Telephone Encounter (Signed)
Called patient back and documented it in the other telephone call

## 2019-10-06 ENCOUNTER — Encounter: Payer: Medicare Other | Admitting: Obstetrics and Gynecology

## 2019-10-06 DIAGNOSIS — Z79899 Other long term (current) drug therapy: Secondary | ICD-10-CM | POA: Diagnosis not present

## 2019-10-06 DIAGNOSIS — M5416 Radiculopathy, lumbar region: Secondary | ICD-10-CM | POA: Diagnosis not present

## 2019-10-11 ENCOUNTER — Telehealth: Payer: Self-pay | Admitting: Gastroenterology

## 2019-10-11 NOTE — Telephone Encounter (Signed)
I called patient & spoke with her to let her know Dr Vicente Males did not think she had an infection but would talk  with her at her telvisit. Patient verbalizes understanding.

## 2019-10-11 NOTE — Telephone Encounter (Signed)
I do not think she has any infection at this point.  We can discuss this further at her phone visit

## 2019-10-11 NOTE — Telephone Encounter (Signed)
Patient called in to reschedule appointment from 10-12-2019 from [place] leaving two voice mails we needed to reschedule her. She has been r/s to Thursday & she stated DR Vicente Males had stated she had an infection & [facility] [address]. This has started to bother her.

## 2019-10-12 ENCOUNTER — Ambulatory Visit: Payer: Medicare Other | Admitting: Gastroenterology

## 2019-10-14 ENCOUNTER — Ambulatory Visit (INDEPENDENT_AMBULATORY_CARE_PROVIDER_SITE_OTHER): Payer: Medicare Other | Admitting: Gastroenterology

## 2019-10-14 DIAGNOSIS — K529 Noninfective gastroenteritis and colitis, unspecified: Secondary | ICD-10-CM

## 2019-10-14 NOTE — Progress Notes (Signed)
[name] , MD [address]  [place]  [place], [place] [address]  Main: [phone]  Fax: [phone]   Primary Care Physician: [name], NP  Virtual Visit via Telephone Note  I connected with patient on 10/14/19 at  3:15 PM EST by telephone and verified that I am speaking with the correct person using two identifiers.   I discussed the limitations, risks, security and privacy concerns of performing an evaluation and management service by telephone and the availability of in person appointments. I also discussed with the patient that there may be a patient responsible charge related to this service. The patient expressed understanding and agreed to proceed.  Location of Patient: Home Location of Provider: Home Persons involved: Patient and provider only   History of Present Illness:  Follow-up to discuss colonoscopy results  HPI: [name] is a 84 y.o. female    Summary of history : Sheis here today to see me as a follow up after her recent sigmoidoscopy.   She was previously seen in 2020 diarrhea after being referred by her primary care physician in January 2020.  At that point of time she has been having diarrhea for over 2 months She was seen in 2018 by [facility] clinic GI and appears that in12/2009she had a colonoscopy as per the last GI note showed chronic colitis with focal cryptitis. Focal active colitis. Not sure why it was not felt that this patient had inflammatory bowel disease. At her initial visit she had been on Meloxicam for about 2-3 Months, 2 tablets a day and no other NSAID's.  She also did have blood in her stool at that point of time.  10/13/2018: EGD: normal . SigmoidoscopyDiscontinuous areas of nonbleeding ulcerated mucosa with no stigmata of recent bleeding were present in the sigmoid colon, in thedescending colon and in the distal transverse colon. Biopsies were taken with a cold forceps for histology.Rectum was normal.  Pathology report showed chronic active colitis and normal rectal biopsies. HSV and CMV negative.   Interval history9/22/2020-10/14/2019  Control meloxicam and doing well.  Denies any diarrhea.  On Dilaudid been having some constipation.  Left upper quadrant pain relieved with defecation.  Daily bowel movements but sensation of incomplete defecation.  No rectal bleeding.  09/09/2019: Sigmoidoscopy: I went all the way to her mid transverse colon.  No evidence of colitis was seen.  There was a single solitary ulcer in the proximal descending colon.  No bleeding was seen.  Biopsies were taken with forceps.  And it demonstrated chronic colitis with mild to moderate activity and small crypt abscess. 09/13/2019: Vitamin D normal, hemoglobin 13.7 with a platelet count of 224 and a white cell count of 9.8.  CMP was normal.  She states that since her sigmoidoscopy she has been having softer than normal stools sometimes having a bit of incontinence.  Some cramping denies any clear diarrhea or rectal bleeding.  Not on any NSAIDs.  Current Outpatient Medications  Medication Sig Dispense Refill  . CALCIUM PO Take 600 mcg by mouth daily.    . Cholecalciferol (D3 VITAMIN PO) Take 25 mcg by mouth 2 (two) times daily.    . citalopram (CELEXA) 20 MG tablet Take 1 tablet (20 mg total) by mouth daily. 90 tablet 0  . Cyanocobalamin (B-12) 2500 MCG TABS Take 2,500 mcg by mouth daily.    Marland Kitchen denosumab (PROLIA) 60 MG/ML SOSY injection Inject 60 mg into the skin every 6 (six) months.    Marland Kitchen  ESBRIET 267 MG TABS Take 6 tablets by mouth daily.     Marland Kitchen estradiol (ESTRACE VAGINAL) 0.1 MG/GM vaginal cream Place 1 Applicatorful vaginally once a week. (1gram weekly) 42.5 g 2  . fluticasone (FLONASE) 50 MCG/ACT nasal spray PLACE 1 SPRAY INTO BOTH NOSTRILS 2 (TWO) TIMES DAILY. 48 mL 1  . HYDROmorphone (DILAUDID) 4 MG tablet Take 4 mg by mouth 3 (three) times daily.     Marland Kitchen nystatin ointment (MYCOSTATIN) APPLY  OINTMENT TOPICALLY TWICE  DAILY 30 g 0  . OXYGEN Inhale 2 L into the lungs as needed.    . VOLTAREN 1 % GEL   2   No current facility-administered medications for this visit.    Allergies as of 10/14/2019  . (No Known Allergies)    Review of Systems:    All systems reviewed and negative except where noted in HPI.   Observations/Objective:  Labs: CMP     Component Value Date/Time   NA 140 09/13/2019 1100   NA 143 09/16/2018 1232   NA 139 02/09/2014 0500   K 5.1 09/13/2019 1100   K 3.2 (L) 02/09/2014 0500   CL 106 09/13/2019 1100   CL 106 02/09/2014 0500   CO2 29 09/13/2019 1100   CO2 27 02/09/2014 0500   GLUCOSE 90 09/13/2019 1100   GLUCOSE 85 02/09/2014 0500   BUN 15 09/13/2019 1100   BUN 14 09/16/2018 1232   BUN 11 02/09/2014 0500   CREATININE 0.76 09/13/2019 1100   CREATININE 0.78 02/09/2014 0500   CALCIUM 9.5 09/13/2019 1100   CALCIUM 8.8 02/09/2014 0500   PROT 7.1 09/13/2019 1100   PROT 6.5 02/03/2017 0818   PROT 6.7 02/08/2014 1247   ALBUMIN 3.9 09/13/2019 1100   ALBUMIN 4.0 02/03/2017 0818   ALBUMIN 3.2 (L) 02/08/2014 1247   AST 26 09/13/2019 1100   AST 35 02/08/2014 1247   ALT 15 09/13/2019 1100   ALT 23 02/08/2014 1247   ALKPHOS 84 09/13/2019 1100   ALKPHOS 101 02/08/2014 1247   BILITOT 0.7 09/13/2019 1100   BILITOT 1.0 02/03/2017 0818   BILITOT 0.7 02/08/2014 1247   GFRNONAA 48 (L) 10/07/2018 1619   GFRNONAA >60 02/09/2014 0500   GFRAA 56 (L) 10/07/2018 1619   GFRAA >60 02/09/2014 0500   Lab Results  Component Value Date   WBC 9.8 09/13/2019   HGB 13.7 09/13/2019   HCT 41.4 09/13/2019   MCV 95.0 09/13/2019   PLT 224.0 09/13/2019    Imaging Studies: No results found.  Assessment and Plan:   [name] is a 84 y.o. y/o femalehere to follow upfor sigmoidoscopy 11/20/2018 showed skipped areas of inflammation with rectal sparing on the left side with features suggestive of eitherNSAID induced colitis vs ICrohns .  Stop meloxicam and all symptoms resolved.   Having some symptoms of IBS constipation probably secondary to Dilaudid.    Plan 1.  My office called ane enquired with her pharmacy and only Sulfasalazine is reasonably priced which I shall order, I call her back in 4 to 6 weeks to see how she is doing.  She will need CBC and CMP monitoring in 2 to 3 weeks time.    I discussed the assessment and treatment plan with the patient. The patient was provided an opportunity to ask questions and all were answered. The patient agreed with the plan and demonstrated an understanding of the instructions.   The patient was advised to call back or seek an in-person evaluation if the  symptoms worsen or if the condition fails to improve as anticipated.  I provided 12 minutes of non-face-to-face time during this encounter.  Dr Terri [place] MD,MRCP [facility]) Gastroenterology/Hepatology Pager: [phone]   Speech recognition software was used to dictate this note.

## 2019-10-19 ENCOUNTER — Other Ambulatory Visit: Payer: Self-pay

## 2019-10-19 DIAGNOSIS — K529 Noninfective gastroenteritis and colitis, unspecified: Secondary | ICD-10-CM

## 2019-10-19 MED ORDER — SULFASALAZINE 500 MG PO TBEC: 500.0000 mg | DELAYED_RELEASE_TABLET | Freq: Four times a day (QID) | ORAL | 5 refills | Status: DC

## 2019-10-20 DIAGNOSIS — M818 Other osteoporosis without current pathological fracture: Secondary | ICD-10-CM | POA: Diagnosis not present

## 2019-11-01 ENCOUNTER — Telehealth: Payer: Self-pay

## 2019-11-01 ENCOUNTER — Telehealth: Payer: Self-pay | Admitting: Gastroenterology

## 2019-11-01 NOTE — Telephone Encounter (Signed)
Spoke with pt and reminded her that she is due for labs this week to check her CBC after starting the Sulfasalazine. Pt states she has not began the medication yet as it's been on back order at her preferred pharmacy due to a drug shortage. Pt states she'll contact our office once she's able to start the medication.

## 2019-11-01 NOTE — Telephone Encounter (Signed)
Noted. Dr. Vicente Males has been informed.

## 2019-11-01 NOTE — Telephone Encounter (Signed)
[facility] left [facility] and she just wanted to let you know this information.

## 2019-11-01 NOTE — Telephone Encounter (Signed)
-----   Message from Rushie Chestnut, [place] sent at 10/19/2019 11:52 AM EST ----- Regarding: Pt due for labs Pt is due for CBC the week of March 1st. Per Dr. Vicente Males, pt will need CBC checked 2 weeks after starting Sulfasalazine and again 4 weeks after along with a CMP.

## 2019-11-03 DIAGNOSIS — M25519 Pain in unspecified shoulder: Secondary | ICD-10-CM | POA: Diagnosis not present

## 2019-11-03 DIAGNOSIS — Z79899 Other long term (current) drug therapy: Secondary | ICD-10-CM | POA: Diagnosis not present

## 2019-11-03 DIAGNOSIS — G894 Chronic pain syndrome: Secondary | ICD-10-CM | POA: Diagnosis not present

## 2019-11-03 DIAGNOSIS — M5416 Radiculopathy, lumbar region: Secondary | ICD-10-CM | POA: Diagnosis not present

## 2019-11-12 DIAGNOSIS — S86912A Strain of unspecified muscle(s) and tendon(s) at lower leg level, left leg, initial encounter: Secondary | ICD-10-CM | POA: Diagnosis not present

## 2019-11-16 ENCOUNTER — Other Ambulatory Visit: Payer: Self-pay

## 2019-11-16 ENCOUNTER — Encounter: Payer: Self-pay | Admitting: Obstetrics and Gynecology

## 2019-11-16 ENCOUNTER — Other Ambulatory Visit: Payer: Self-pay | Admitting: Obstetrics and Gynecology

## 2019-11-16 ENCOUNTER — Ambulatory Visit (INDEPENDENT_AMBULATORY_CARE_PROVIDER_SITE_OTHER): Payer: Medicare Other | Admitting: Obstetrics and Gynecology

## 2019-11-16 VITALS — BP 127/71 | HR 61 | Ht 60.0 in | Wt 128.4 lb

## 2019-11-16 DIAGNOSIS — N814 Uterovaginal prolapse, unspecified: Secondary | ICD-10-CM

## 2019-11-16 DIAGNOSIS — Z4689 Encounter for fitting and adjustment of other specified devices: Secondary | ICD-10-CM | POA: Diagnosis not present

## 2019-11-16 DIAGNOSIS — R339 Retention of urine, unspecified: Secondary | ICD-10-CM | POA: Diagnosis not present

## 2019-11-16 DIAGNOSIS — K5903 Drug induced constipation: Secondary | ICD-10-CM | POA: Diagnosis not present

## 2019-11-16 DIAGNOSIS — N952 Postmenopausal atrophic vaginitis: Secondary | ICD-10-CM

## 2019-11-16 NOTE — Progress Notes (Signed)
    GYNECOLOGY PROGRESS NOTE  Subjective:    Patient ID: [name], female    DOB: [birth year], 84 y.o.   MRN: [id number]  HPI  Patient is a 84 y.o. G2P2 female who presented today for a pessary check. She reports no vaginal bleeding. She notes that she is does note feel she is able to empty her bladder completely. Reports that recently with bowel movements, with cramping and back pain.  Does have issues with constipation due to new medication (opiod for chronic back pain).  Denies vaginal bleeding.    The following portions of the patient's history were reviewed and updated as appropriate: allergies, current medications, past family history, past medical history, past social history, past surgical history and problem list.   Review of Systems Pertinent items noted in HPI and remainder of comprehensive ROS otherwise negative.   Objective:   Blood pressure 127/71, pulse 61, height 5' (1.524 m), weight 128 lb 6.4 oz (58.2 kg).    General appearance: alert and no distress  Abdomen: soft, non-tender. No masses palpable or organomegaly. No suprapubic tenderness.  Pelvis: The patient's Size 3 support pessary with knob was removed, and cleaned.  Speculum examination revealed mildly atrophic vaginal mucosa and urethra with no lesions. Scant thin white discharge also present, non-malodorous.   Assessment:   Pessary in situ Cystocele Vaginal atrophy Incomplete bladder emptying Constipation (drug induced)  Plan:   - Pessary now removed, cleaned, and reinserted.  Will see if patient's incomplete bladder emptying symptoms will resolve. To f/u in 10-12 weeks for pessary check. If still present next visit, can discuss other management options, including changing back to pessary with support (currently has pessary with knob).  - ContinuePremarin cream, two to three times weekly and apply to urethral orifice and introitus. Continue use    - Discussed use of stool softeners for drug-induced  consitpation     Rubie Maid, MD [facility]

## 2019-11-16 NOTE — Progress Notes (Signed)
Pt present for pessary check. Pt stated that she feels like when she urinates that she is not having a full flow of urine. Pt is concerned that the pessary maybe blocking the flow of her urine.

## 2019-11-16 NOTE — Patient Instructions (Addendum)
Pick up prescription for constipation: Dulcolax or Senna-Kot tablets     Constipation, Adult Constipation is when a person:  Poops (has a bowel movement) fewer times in a week than normal.  Has a hard time pooping.  Has poop that is dry, hard, or bigger than normal. Follow these instructions at home: Eating and drinking   Eat foods that have a lot of fiber, such as: ? Fresh fruits and vegetables. ? Whole grains. ? Beans.  Eat less of foods that are high in fat, low in fiber, or overly processed, such as: ? [place] fries. ? Hamburgers. ? Cookies. ? Candy. ? Soda.  Drink enough fluid to keep your pee (urine) clear or pale yellow. General instructions  Exercise regularly or as told by your doctor.  Go to the restroom when you feel like you need to poop. Do not hold it in.  Take over-the-counter and prescription medicines only as told by your doctor. These include any fiber supplements.  Do pelvic floor retraining exercises, such as: ? Doing deep breathing while relaxing your lower belly (abdomen). ? Relaxing your pelvic floor while pooping.  Watch your condition for any changes.  Keep all follow-up visits as told by your doctor. This is important. Contact a doctor if:  You have pain that gets worse.  You have a fever.  You have not pooped for 4 days.  You throw up (vomit).  You are not hungry.  You lose weight.  You are bleeding from the anus.  You have thin, pencil-like poop (stool). Get help right away if:  You have a fever, and your symptoms suddenly get worse.  You leak poop or have blood in your poop.  Your belly feels hard or bigger than normal (is bloated).  You have very bad belly pain.  You feel dizzy or you faint. This information is not intended to replace advice given to you by your health care provider. Make sure you discuss any questions you have with your health care provider. Document Revised: 08/01/2017 Document Reviewed:  02/07/2016 Elsevier Patient Education  2020 [employer].

## 2019-11-17 DIAGNOSIS — R0600 Dyspnea, unspecified: Secondary | ICD-10-CM | POA: Diagnosis not present

## 2019-11-17 DIAGNOSIS — Z01818 Encounter for other preprocedural examination: Secondary | ICD-10-CM | POA: Diagnosis not present

## 2019-11-17 DIAGNOSIS — T50905D Adverse effect of unspecified drugs, medicaments and biological substances, subsequent encounter: Secondary | ICD-10-CM | POA: Diagnosis not present

## 2019-11-17 DIAGNOSIS — G4733 Obstructive sleep apnea (adult) (pediatric): Secondary | ICD-10-CM | POA: Diagnosis not present

## 2019-11-17 DIAGNOSIS — J8410 Pulmonary fibrosis, unspecified: Secondary | ICD-10-CM | POA: Diagnosis not present

## 2019-11-20 IMAGING — US US CAROTID DUPLEX BILAT
1 series · 13 of 24 positions shown · non-contrast
Comparison: 02/08/2014

CLINICAL DATA: Carotid atherosclerosis.

EXAM:
BILATERAL CAROTID DUPLEX ULTRASOUND
TECHNIQUE: Gray scale imaging, color Doppler and duplex ultrasound were
performed of bilateral carotid and vertebral arteries in the neck.

[Series 1: us carotid duplex bilat · 0.06mm/px · 13 of 66 slices shown]
[im 1/66]
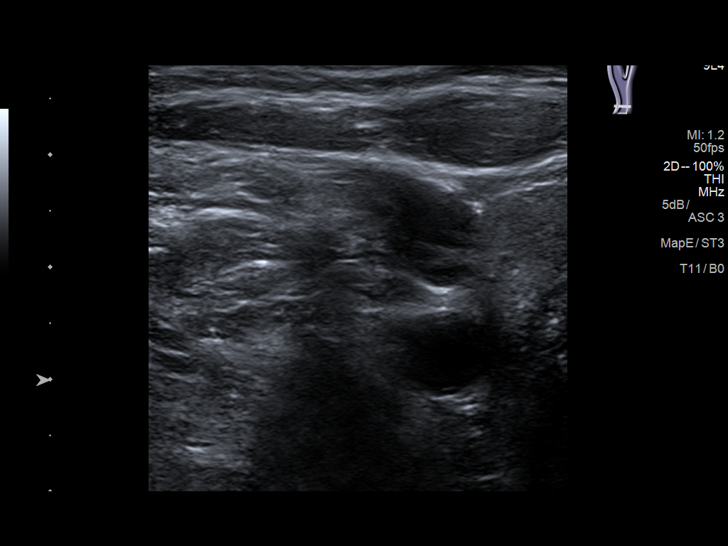
[im 6/66]
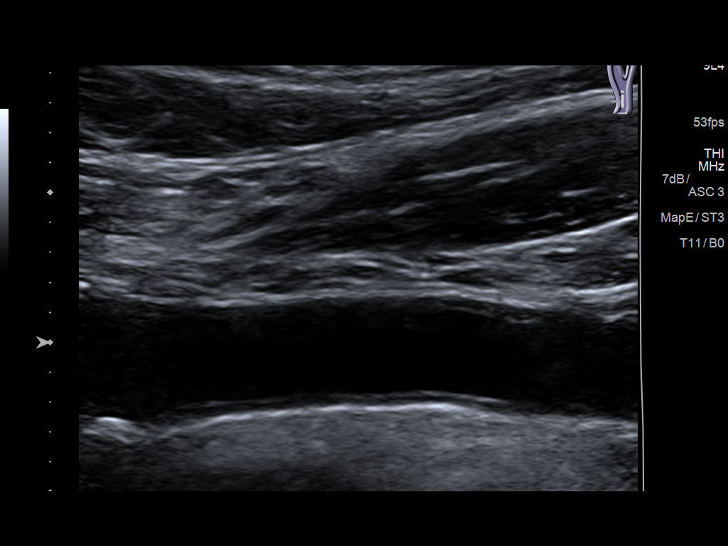
[im 12/66]
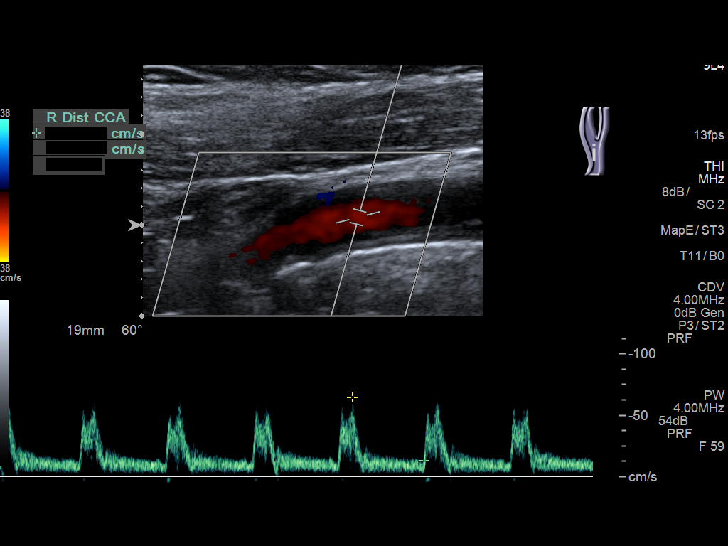
[im 17/66]
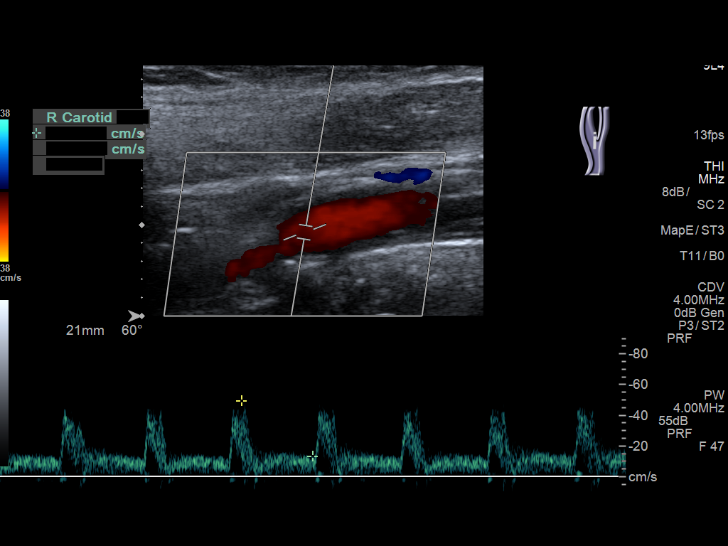
[im 23/66]
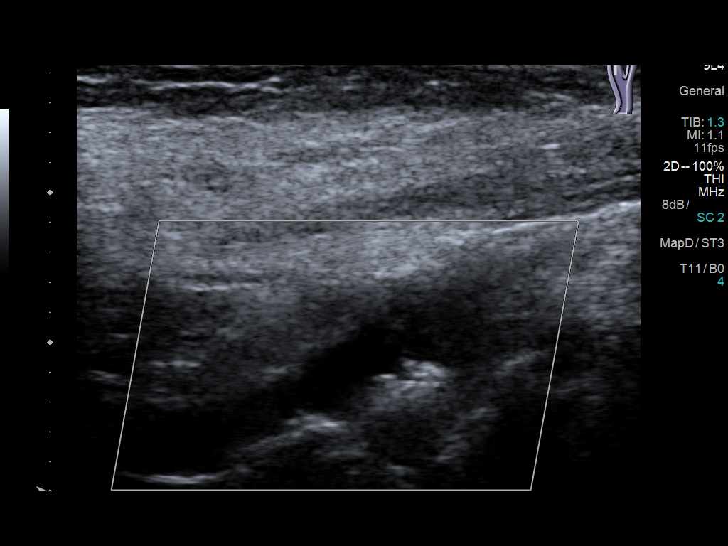
[im 29/66]
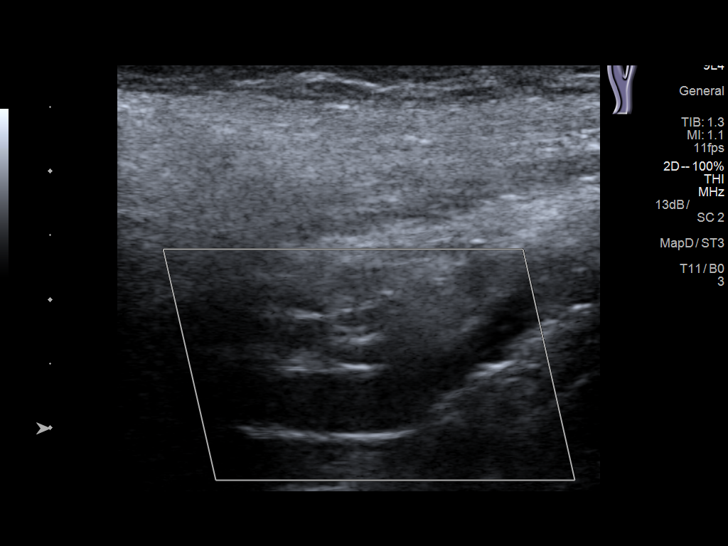
[im 34/66]
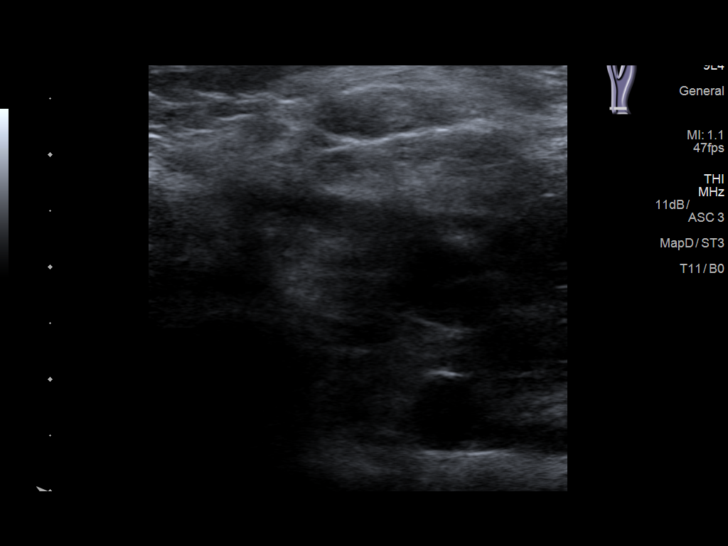
[im 37/66]
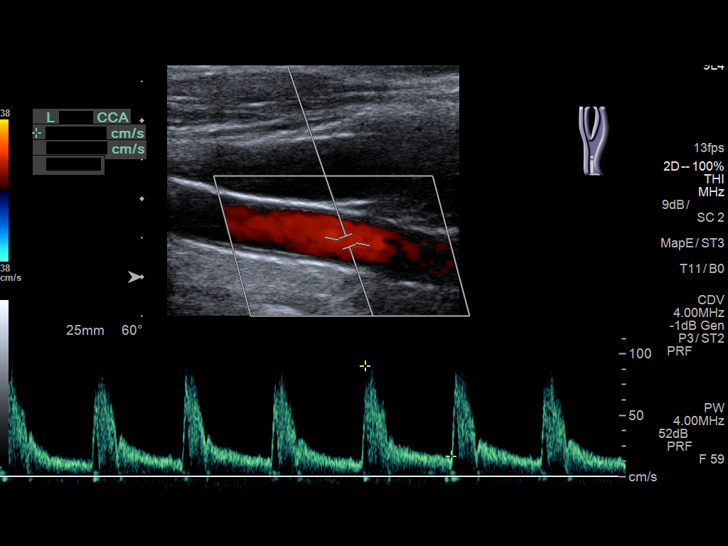
[im 43/66]
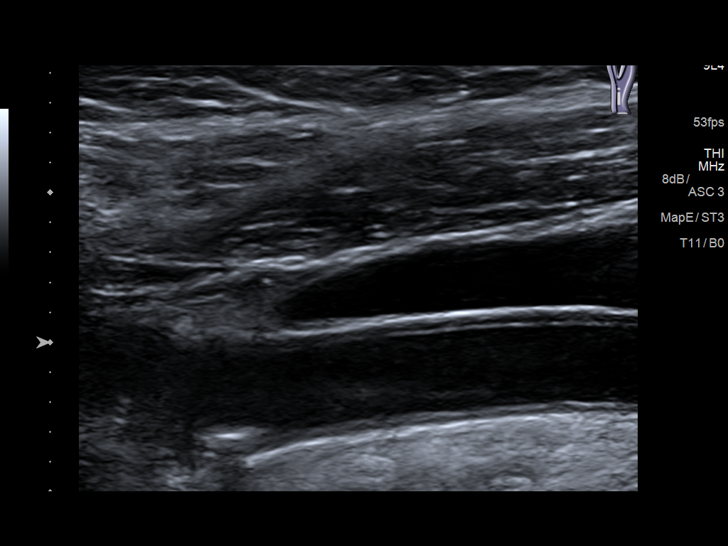
[im 49/66]
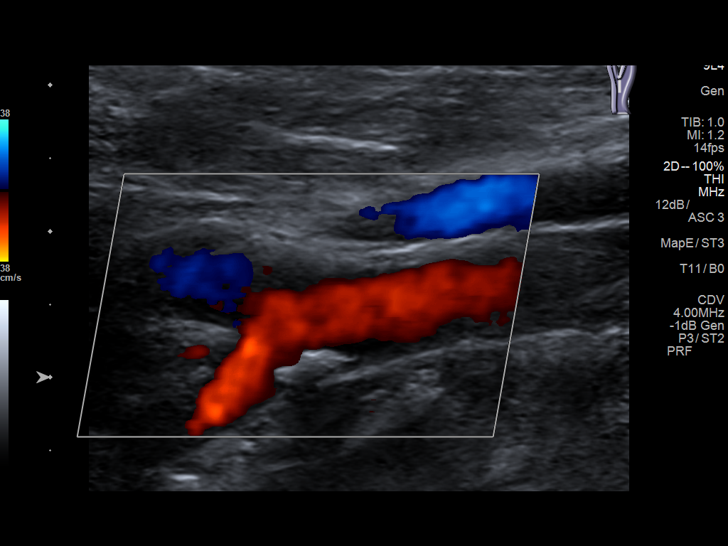
[im 54/66]
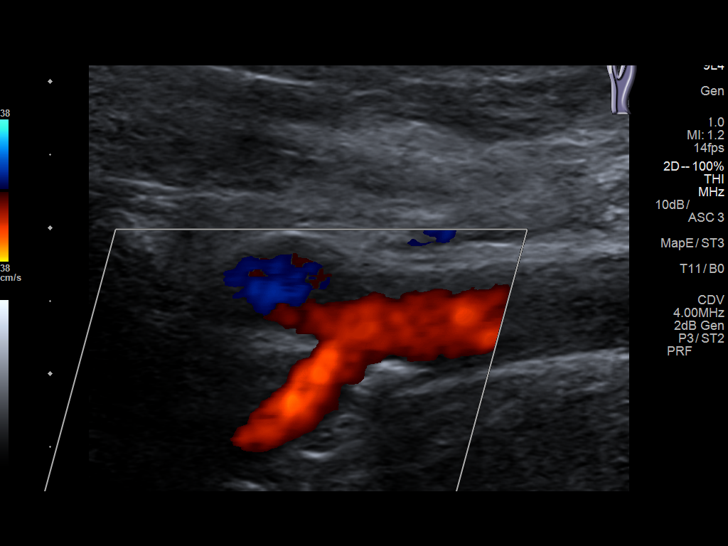
[im 60/66]
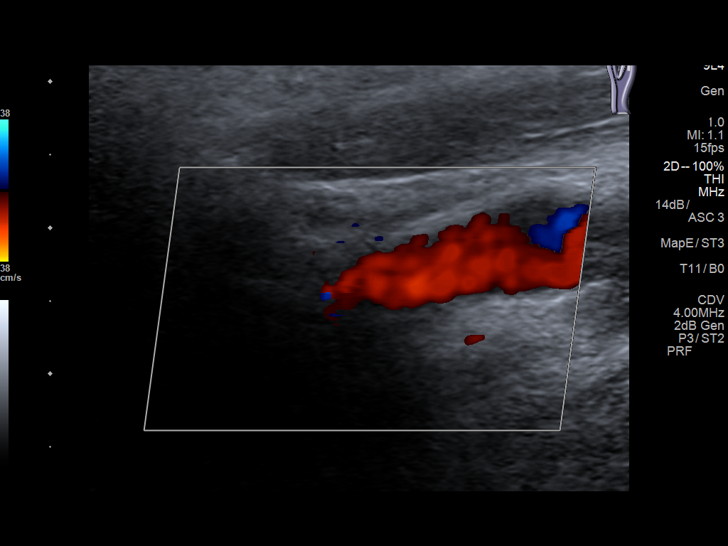
[im 66/66]
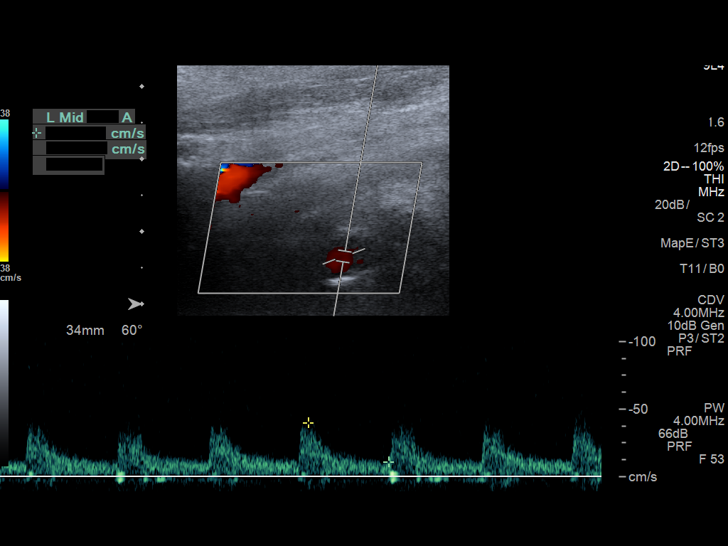

[13 of 24 positions shown; findings below may reference images not displayed]

FINDINGS: Criteria: Quantification of carotid stenosis is based on velocity
parameters that correlate the residual internal carotid diameter
with NASCET-based stenosis levels, using the diameter of the distal
internal carotid lumen as the denominator for stenosis measurement.

The following velocity measurements were obtained:

RIGHT

ICA:  73 cm/sec

CCA:  67 cm/sec

SYSTOLIC ICA/CCA RATIO:

DIASTOLIC ICA/CCA RATIO:

ECA:  76 cm/sec

LEFT

ICA:  74 cm/sec

CCA:  68 cm/sec

SYSTOLIC ICA/CCA RATIO:

DIASTOLIC ICA/CCA RATIO:

ECA:  69 cm/sec

RIGHT CAROTID ARTERY: Minimal intimal thickening in the bulb. Mild
calcified plaque at the origin of the external carotid. Low
resistance internal carotid Doppler pattern.

RIGHT VERTEBRAL ARTERY:  Antegrade.

LEFT CAROTID ARTERY: Mild focal calcified plaque in the bulb. Low
resistance internal carotid Doppler pattern is preserved.

LEFT VERTEBRAL ARTERY:  Antegrade
IMPRESSION: Less than 50% stenosis in the right and left internal carotid
arteries.

## 2019-12-03 ENCOUNTER — Other Ambulatory Visit: Payer: Self-pay | Admitting: Primary Care

## 2019-12-03 DIAGNOSIS — F419 Anxiety disorder, unspecified: Secondary | ICD-10-CM

## 2019-12-08 DIAGNOSIS — M5416 Radiculopathy, lumbar region: Secondary | ICD-10-CM | POA: Diagnosis not present

## 2019-12-08 DIAGNOSIS — G894 Chronic pain syndrome: Secondary | ICD-10-CM | POA: Diagnosis not present

## 2019-12-08 DIAGNOSIS — M25519 Pain in unspecified shoulder: Secondary | ICD-10-CM | POA: Diagnosis not present

## 2019-12-08 DIAGNOSIS — Z79899 Other long term (current) drug therapy: Secondary | ICD-10-CM | POA: Diagnosis not present

## 2019-12-09 DIAGNOSIS — M545 Low back pain: Secondary | ICD-10-CM | POA: Diagnosis not present

## 2019-12-09 DIAGNOSIS — R293 Abnormal posture: Secondary | ICD-10-CM | POA: Diagnosis not present

## 2019-12-16 ENCOUNTER — Telehealth: Payer: Self-pay | Admitting: Obstetrics and Gynecology

## 2019-12-16 DIAGNOSIS — N952 Postmenopausal atrophic vaginitis: Secondary | ICD-10-CM

## 2019-12-16 MED ORDER — ESTRADIOL 0.1 MG/GM VA CREA: 1.0000 | TOPICAL_CREAM | VAGINAL | 2 refills | Status: DC

## 2019-12-16 NOTE — Telephone Encounter (Signed)
Please inform pt that her medication has been refilled [facility] [address]. Thanks [employer]

## 2019-12-16 NOTE — Telephone Encounter (Signed)
Pt called in and stated she needs a refill of her ESTRACE [facility] [address]. Please advise

## 2019-12-20 DIAGNOSIS — M545 Low back pain: Secondary | ICD-10-CM | POA: Diagnosis not present

## 2019-12-20 DIAGNOSIS — R293 Abnormal posture: Secondary | ICD-10-CM | POA: Diagnosis not present

## 2019-12-22 DIAGNOSIS — R293 Abnormal posture: Secondary | ICD-10-CM | POA: Diagnosis not present

## 2019-12-22 DIAGNOSIS — M545 Low back pain: Secondary | ICD-10-CM | POA: Diagnosis not present

## 2019-12-27 DIAGNOSIS — M545 Low back pain: Secondary | ICD-10-CM | POA: Diagnosis not present

## 2019-12-27 DIAGNOSIS — R293 Abnormal posture: Secondary | ICD-10-CM | POA: Diagnosis not present

## 2019-12-29 DIAGNOSIS — R293 Abnormal posture: Secondary | ICD-10-CM | POA: Diagnosis not present

## 2019-12-29 DIAGNOSIS — M545 Low back pain: Secondary | ICD-10-CM | POA: Diagnosis not present

## 2019-12-30 DIAGNOSIS — H10502 Unspecified blepharoconjunctivitis, left eye: Secondary | ICD-10-CM | POA: Diagnosis not present

## 2020-01-04 DIAGNOSIS — M545 Low back pain: Secondary | ICD-10-CM | POA: Diagnosis not present

## 2020-01-04 DIAGNOSIS — R293 Abnormal posture: Secondary | ICD-10-CM | POA: Diagnosis not present

## 2020-01-05 DIAGNOSIS — Z79899 Other long term (current) drug therapy: Secondary | ICD-10-CM | POA: Diagnosis not present

## 2020-01-05 DIAGNOSIS — M25519 Pain in unspecified shoulder: Secondary | ICD-10-CM | POA: Diagnosis not present

## 2020-01-05 DIAGNOSIS — Z5181 Encounter for therapeutic drug level monitoring: Secondary | ICD-10-CM | POA: Diagnosis not present

## 2020-01-05 DIAGNOSIS — G894 Chronic pain syndrome: Secondary | ICD-10-CM | POA: Diagnosis not present

## 2020-01-05 DIAGNOSIS — M5416 Radiculopathy, lumbar region: Secondary | ICD-10-CM | POA: Diagnosis not present

## 2020-01-10 DIAGNOSIS — M5416 Radiculopathy, lumbar region: Secondary | ICD-10-CM | POA: Diagnosis not present

## 2020-01-12 DIAGNOSIS — R293 Abnormal posture: Secondary | ICD-10-CM | POA: Diagnosis not present

## 2020-01-12 DIAGNOSIS — M545 Low back pain: Secondary | ICD-10-CM | POA: Diagnosis not present

## 2020-01-18 DIAGNOSIS — H15102 Unspecified episcleritis, left eye: Secondary | ICD-10-CM | POA: Diagnosis not present

## 2020-01-19 DIAGNOSIS — M545 Low back pain: Secondary | ICD-10-CM | POA: Diagnosis not present

## 2020-01-19 DIAGNOSIS — R293 Abnormal posture: Secondary | ICD-10-CM | POA: Diagnosis not present

## 2020-01-21 DIAGNOSIS — R293 Abnormal posture: Secondary | ICD-10-CM | POA: Diagnosis not present

## 2020-01-21 DIAGNOSIS — M545 Low back pain: Secondary | ICD-10-CM | POA: Diagnosis not present

## 2020-01-25 DIAGNOSIS — M545 Low back pain: Secondary | ICD-10-CM | POA: Diagnosis not present

## 2020-01-25 DIAGNOSIS — R293 Abnormal posture: Secondary | ICD-10-CM | POA: Diagnosis not present

## 2020-01-27 DIAGNOSIS — R293 Abnormal posture: Secondary | ICD-10-CM | POA: Diagnosis not present

## 2020-01-27 DIAGNOSIS — M545 Low back pain: Secondary | ICD-10-CM | POA: Diagnosis not present

## 2020-02-03 DIAGNOSIS — R293 Abnormal posture: Secondary | ICD-10-CM | POA: Diagnosis not present

## 2020-02-03 DIAGNOSIS — M545 Low back pain: Secondary | ICD-10-CM | POA: Diagnosis not present

## 2020-02-10 DIAGNOSIS — M545 Low back pain: Secondary | ICD-10-CM | POA: Diagnosis not present

## 2020-02-10 DIAGNOSIS — R293 Abnormal posture: Secondary | ICD-10-CM | POA: Diagnosis not present

## 2020-02-16 ENCOUNTER — Other Ambulatory Visit: Payer: Self-pay

## 2020-02-16 ENCOUNTER — Encounter: Payer: Self-pay | Admitting: Obstetrics and Gynecology

## 2020-02-16 ENCOUNTER — Ambulatory Visit (INDEPENDENT_AMBULATORY_CARE_PROVIDER_SITE_OTHER): Payer: Medicare Other | Admitting: Obstetrics and Gynecology

## 2020-02-16 VITALS — BP 134/71 | HR 66 | Ht 60.0 in | Wt 128.2 lb

## 2020-02-16 DIAGNOSIS — R339 Retention of urine, unspecified: Secondary | ICD-10-CM | POA: Diagnosis not present

## 2020-02-16 DIAGNOSIS — N939 Abnormal uterine and vaginal bleeding, unspecified: Secondary | ICD-10-CM | POA: Diagnosis not present

## 2020-02-16 DIAGNOSIS — N814 Uterovaginal prolapse, unspecified: Secondary | ICD-10-CM

## 2020-02-16 DIAGNOSIS — Z4689 Encounter for fitting and adjustment of other specified devices: Secondary | ICD-10-CM | POA: Diagnosis not present

## 2020-02-16 DIAGNOSIS — B372 Candidiasis of skin and nail: Secondary | ICD-10-CM | POA: Diagnosis not present

## 2020-02-16 DIAGNOSIS — N952 Postmenopausal atrophic vaginitis: Secondary | ICD-10-CM

## 2020-02-16 DIAGNOSIS — N898 Other specified noninflammatory disorders of vagina: Secondary | ICD-10-CM

## 2020-02-16 LAB — POCT URINALYSIS DIPSTICK
Bilirubin, UA: NEGATIVE
Glucose, UA: NEGATIVE
Ketones, UA: NEGATIVE
Nitrite, UA: NEGATIVE
Protein, UA: NEGATIVE
Spec Grav, UA: 1.025 (ref 1.010–1.025)
Urobilinogen, UA: 0.2 E.U./dL
pH, UA: 7 (ref 5.0–8.0)

## 2020-02-16 MED ORDER — NYSTATIN 100000 UNIT/GM EX POWD: 1.0000 "application " | Freq: Two times a day (BID) | CUTANEOUS | 6 refills | Status: DC

## 2020-02-16 MED ORDER — NYSTATIN 100000 UNIT/GM EX CREA: 1.0000 "application " | TOPICAL_CREAM | Freq: Two times a day (BID) | CUTANEOUS | 1 refills | Status: DC

## 2020-02-16 NOTE — Patient Instructions (Addendum)
1. Apply Nystatin Cream to underside of right breast and external vaginal area twice daily until rash improves.  Do not use the powder at this time.   2. Once rash has improved, can switch to Nystatin Powder (use once or twice daily).   3. Apply 1/2 gram of Premarin Cream (first black mark on applicator) at night to the internal vagina to help vaginal bleeding for 2 weeks (pink, white, black box).  Samples were given to you in the office.  Use the applicator provided in the box.   4. STOP using Trimosan gel at this time.   5. Return in 1 month for pessary reinsertion.

## 2020-02-16 NOTE — Progress Notes (Signed)
Pt present for pessary check. Pt stated noticing some vaginal bleeding and pain. Pt also stating have some bladder and urination issues.

## 2020-02-16 NOTE — Progress Notes (Signed)
GYNECOLOGY PROGRESS NOTE  Subjective:    Patient ID: [name], female    DOB: 17-Nov-1935, 84 y.o.   MRN: [id number]  HPI  Patient is a 84 y.o. G2P2 female who presented today for a pessary check. She has several complaints today.   1. Redness and itching in the vulvar/vaginal area. Noting heavier discharge.  Notes red streaks (blood) in the discharge. Using Trimo-San gel up to 3 times per week. Has been having spotting since new pessary was placed.  Would like to switch back to her old pessary.   2. Goes to the bathroom, has a normal flow, but then when she tries to get up, she feels more urine flowing.  Then several minutes later she may feel an additional   3. Has rash under the right breast that is bothering her. Uses Nystatin powder once or twice daily, but this is not helping anymore. Has been ongoing for 3 weeks.   The following portions of the patient's history were reviewed and updated as appropriate: allergies, current medications, past family history, past medical history, past social history, past surgical history and problem list.   Review of Systems Pertinent items noted in HPI and remainder of comprehensive ROS otherwise negative.   Objective:   Blood pressure 134/71, pulse 66, height 5' (1.524 m), weight 128 lb 3.2 oz (58.2 kg).    General appearance: alert and no distress  Breast:  no suspicious masses, nipple changes. Hyperemic flat rash underneath right breast.  Abdomen: soft, non-tender. No masses palpable or organomegaly. No suprapubic tenderness.  Pelvis: The patient's Size 3 support pessary with knob was removed, and cleaned. External exam with hyperemic rash of the vulva bilaterally.  Speculum examination revealed moderately atrophic vaginal mucosa and urethra with anterior vaginal abrasion just beyond introitus. Scant thin gray discharge also present, non-malodorous.  Neurologic: grossly intact  Labs:  Results for orders placed or performed in visit  on 02/16/20  POCT urinalysis dipstick  Result Value Ref Range   Color, UA yellow    Clarity, UA clear    Glucose, UA Negative Negative   Bilirubin, UA neg    Ketones, UA neg    Spec Grav, UA 1.025 1.010 - 1.025   Blood, UA large    pH, UA 7.0 5.0 - 8.0   Protein, UA Negative Negative   Urobilinogen, UA 0.2 0.2 or 1.0 E.U./dL   Nitrite, UA neg    Leukocytes, UA Moderate (2+) (A) Negative   Appearance yellow;clear    Odor      Assessment:   1. Pessary maintenance   2. Vaginal atrophy   3. Incomplete bladder emptying   4. Cystocele with uterine descensus   5. Vaginal bleeding   6. Vaginal discharge   7. Yeast dermatitis     Plan:   - Pessary now removed, cleaned.  Patient desires pessary break for 1 month. Will schedule for reinsertion. Will replace with old pessary (Size 3 pessary with support).  - Given samples of Premarin cream to use daily for 2 weeks internally (has only been using at introitus), then can decrease back to 2 times weekly.  - Prescription given for Nystatin cream to use for yeast dermatitis (to use BID x 2 weeks under breast and on external vulva). Then can return back to use of the pessary.  - UA ordered as patient noting urinary symptoms on top of incomplete bladder emptying, was negative except for blood, however blood noted in vaginal vault.  Rubie Maid, MD [facility]

## 2020-02-28 ENCOUNTER — Telehealth (INDEPENDENT_AMBULATORY_CARE_PROVIDER_SITE_OTHER): Payer: Medicare Other | Admitting: Primary Care

## 2020-02-28 ENCOUNTER — Encounter: Payer: Self-pay | Admitting: Primary Care

## 2020-02-28 DIAGNOSIS — R197 Diarrhea, unspecified: Secondary | ICD-10-CM

## 2020-02-28 NOTE — Patient Instructions (Signed)
Work on Financial planner with water or Gatorade as discussed.  Avoid use of Imodium for now.  Please call me with an update in 1-2 days, or sooner if symptoms do not continue to improve.  It was a pleasure to see you today!

## 2020-02-28 NOTE — Assessment & Plan Note (Signed)
Acute for the last 24 hours, now improving. Suspect viral GI illness given rapid onset and the fact that she's already improving. Covid vaccine series completed.   Discussed the importance of proper hydration with water or Gatorade.  Gradually increase diet as tolerated.   She will update in 1-2 days. Return precautions provided.

## 2020-02-28 NOTE — Progress Notes (Signed)
Subjective:    Patient ID: [name], female    DOB: 10-02-1935, 84 y.o.   MRN: [id number]  HPI     [name] - 84 y.o. female  MRN [id number]  Date of Birth: December 13, 1935  PCP: [name], NP  This service was provided via telemedicine. Phone Visit performed on 02/28/2020    Rationale for phone visit along with limitations reviewed. I discussed the limitations, risks, security and privacy concerns of performing a phone visit and the availability of in person appointments. I also discussed with the patient that there may be a patient responsible charge related to this service. Patient consented to telephone encounter.    Location of patient: Home Location of provider: Office [place] [facility] Name of referring provider: N/A  Both patient and I participated during the visit.   Names of persons and role in encounter: Provider: [name], NP  Patient: [name]  Other: N/A   Time on call: 6 min - 36 sec   Subjective: Chief Complaint  Patient presents with  . Diarrhea     HPI:  [name] is a 84 year old female with a history of hypertension, sleep apnea, pulmonary fibrosis, rectal bleeding, anxiety, urinary incontinence, fatigue who presents today with a chief complaint of diarrhea.   Symptoms began yesterday morning with symptoms of diarrhea, fatigue, chills. Stools are all liquid now, initially soft. Stool frequency yesterday during the day was every 2 hours and she was waking every 1 hour during the night last night.   Today she's feeling slightly better, she denies nausea, and her stool frequency is lower thus far. No one else in her home has diarrhea symptoms or any other symptoms. She's taking Aleve for an "eye infection". She's not eating or drinking much due to diarrhea. She completed the Covid-19 vaccine series.     Objective/Observations:  No physical exam or vital signs collected unless specifically identified  below.   Temp (!) 97.5 F (36.4 C)   Ht 5' (1.524 m)   Wt 126 lb (57.2 kg)   BMI 24.61 kg/m    Respiratory status: speaks in complete sentences without evident shortness of breath.   Assessment/Plan:  Acute for the last 24 hours, now improving. Suspect viral GI illness given rapid onset and the fact that she's already improving. Covid vaccine series completed.   Discussed the importance of proper hydration with water or Gatorade.  Gradually increase diet as tolerated.   She will update in 1-2 days. Return precautions provided.  No problem-specific Assessment & Plan notes found for this encounter.   I discussed the assessment and treatment plan with the patient. The patient was provided an opportunity to ask questions and all were answered. The patient agreed with the plan and demonstrated an understanding of the instructions.  Lab Orders  No laboratory test(s) ordered today    No orders of the defined types were placed in this encounter.   The patient was advised to call back or seek an in-person evaluation if the symptoms worsen or if the condition fails to improve as anticipated.  [name], NP    Review of Systems  Constitutional: Positive for chills and fatigue. Negative for fever.  Gastrointestinal: Positive for diarrhea. Negative for abdominal pain, nausea and vomiting.       Past Medical History:  Diagnosis Date  . Altered mental state   . Anxiety   . BMI 30.0-30.9,adult   . Cellulitis   .  Chest pain, atypical   . Compression fracture of L4 lumbar vertebra   . Constipation   . Cystitis   . Cystocele   . Diverticulosis   . Fatigue   . 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 Gastroenteritis   . History of back surgery   . History of herniated intervertebral disc   . HTN (hypertension)   . Hypercholesteremia   . Hypocalcemia   . Incontinence of urine   . OA (osteoarthritis)    shoulder and back  . Obesity   . Osteopenia   . Pneumonia    . Shortness of breath   . Sleep apnea   . Thrush   . Vitamin D deficiency      Social History   Socioeconomic History  . Marital status: Married    Spouse name: [name]  . Number of children: 2  . Years of education: Not on file  . Highest education level: Not on file  Occupational History  . Not on file  Tobacco Use  . Smoking status: Never Smoker  . Smokeless tobacco: Never Used  Vaping Use  . Vaping Use: Never used  Substance and Sexual Activity  . Alcohol use: No  . Drug use: No  . Sexual activity: Never    Birth control/protection: None  Other Topics Concern  . Not on file  Social History Narrative  . Not on file   Social Determinants of Health   Financial Resource Strain:   . Difficulty of Paying Living Expenses:   Food Insecurity:   . Worried About Charity fundraiser in the Last Year:   . Arboriculturist in the Last Year:   Transportation Needs:   . Film/video editor (Medical):   Marland Kitchen Lack of Transportation (Non-Medical):   Physical Activity:   . Days of Exercise per Week:   . Minutes of Exercise per Session:   Stress:   . Feeling of Stress :   Social Connections:   . Frequency of Communication with Friends and Family:   . Frequency of Social Gatherings with Friends and Family:   . Attends Religious Services:   . Active Member of Clubs or Organizations:   . Attends Archivist Meetings:   Marland Kitchen Marital Status:   Intimate Partner Violence:   . Fear of Current or Ex-Partner:   . Emotionally Abused:   Marland Kitchen Physically Abused:   . Sexually Abused:     Past Surgical History:  Procedure Laterality Date  . BACK SURGERY    . CATARACT EXTRACTION W/PHACO Left 04/24/2016   Procedure: CATARACT EXTRACTION PHACO AND INTRAOCULAR LENS PLACEMENT (IOC);  Surgeon: Leandrew Koyanagi, MD;  Location: [facility];  Service: Ophthalmology;  Laterality: Left;  . CATARACT EXTRACTION W/PHACO Right 07/14/2017   Procedure: CATARACT EXTRACTION PHACO AND  INTRAOCULAR LENS PLACEMENT ([place])  RIGHT;  Surgeon: Leandrew Koyanagi, MD;  Location: [place];  Service: Ophthalmology;  Laterality: Right;  . CHOLECYSTECTOMY    . ESOPHAGOGASTRODUODENOSCOPY (EGD) WITH PROPOFOL N/A 10/13/2018   Procedure: ESOPHAGOGASTRODUODENOSCOPY (EGD) WITH PROPOFOL;  Surgeon: [name], MD;  Location: [facility] ENDOSCOPY;  Service: Gastroenterology;  Laterality: N/A;  . FLEXIBLE SIGMOIDOSCOPY N/A 10/13/2018   Procedure: FLEXIBLE SIGMOIDOSCOPY;  Surgeon: [name], MD;  Location: [facility] ENDOSCOPY;  Service: Gastroenterology;  Laterality: N/A;  . FLEXIBLE SIGMOIDOSCOPY N/A 09/09/2019   Procedure: FLEXIBLE SIGMOIDOSCOPY;  Surgeon: [name], MD;  Location: [facility] ENDOSCOPY;  Service: Gastroenterology;  Laterality: N/A;  . ROTATOR CUFF REPAIR Bilateral  left-12/2009; right-03/2009 dr.califf  . TONSILLECTOMY    . TOTAL SHOULDER REPLACEMENT Left     Family History  Problem Relation Age of Onset  . COPD Mother   . Cancer Brother        colon    No Known Allergies  Current Outpatient Medications on File Prior to Visit  Medication Sig Dispense Refill  . bismuth subsalicylate (PEPTO BISMOL) 262 MG chewable tablet Chew 524 mg by mouth as needed.    Marland Kitchen CALCIUM PO Take 600 mcg by mouth daily.    . Cholecalciferol (D3 VITAMIN PO) Take 25 mcg by mouth 2 (two) times daily.    . citalopram (CELEXA) 20 MG tablet TAKE 1 TABLET BY MOUTH EVERY DAY 90 tablet 1  . Cyanocobalamin (B-12) 2500 MCG TABS Take 2,500 mcg by mouth daily.    Marland Kitchen denosumab (PROLIA) 60 MG/ML SOSY injection Inject 60 mg into the skin every 6 (six) months.    . ESBRIET 267 MG TABS Take 6 tablets by mouth daily.     Marland Kitchen estradiol (ESTRACE VAGINAL) 0.1 MG/GM vaginal cream Place 1 Applicatorful vaginally 3 (three) times a week. 42.5 g 2  . fluticasone (FLONASE) 50 MCG/ACT nasal spray PLACE 1 SPRAY INTO BOTH NOSTRILS 2 (TWO) TIMES DAILY. 48 mL 1  . HYDROmorphone (DILAUDID) 4 MG tablet Take 4 mg by mouth 3 (three)  times daily.     Marland Kitchen nystatin (NYSTATIN) powder Apply 1 application topically 2 (two) times daily. 30 g 6  . nystatin cream (MYCOSTATIN) Apply 1 application topically 2 (two) times daily. 30 g 1  . OXYGEN Inhale 2 L into the lungs as needed.    Levin Erp SULFATE VAGINAL (TRIMO-SAN) 0.025 % GEL Place vaginally.    . sulfaSALAzine (AZULFIDINE) 500 MG EC tablet Take 1 tablet (500 mg total) by mouth 4 (four) times daily. 120 tablet 5  . VOLTAREN 1 % GEL   2   No current facility-administered medications on file prior to visit.    Temp (!) 97.5 F (36.4 C)   Ht 5' (1.524 m)   Wt 126 lb (57.2 kg)   BMI 24.61 kg/m    Objective:   Physical Exam Pulmonary:     Effort: Pulmonary effort is normal.  Neurological:     Mental Status: She is alert and oriented to person, place, and time.  Psychiatric:        Mood and Affect: Mood normal.            Assessment & Plan:

## 2020-03-01 ENCOUNTER — Telehealth: Payer: Self-pay | Admitting: Primary Care

## 2020-03-01 DIAGNOSIS — R197 Diarrhea, unspecified: Secondary | ICD-10-CM

## 2020-03-01 NOTE — Telephone Encounter (Signed)
Spoken and notified patient of Tawni Millers comments.  Lab appointment is schedule for Friday 03/03/2020 due afraid to leave the house if she needs to go  Today, she went 3 times but in the past few days it has been more  Yes, the leg is new  No, she is not drink enough but will try since I told her that she needs to increase water intake since she is having diarrhea  No, recent antibiotic use

## 2020-03-01 NOTE — Telephone Encounter (Signed)
Patient had a virtual visit with Anda Kraft on 02/28/20. Patient was told to call Anda Kraft back with an update.  Patient said she's feeling a little better.  The diarrhea is not as bad as it was. Patient said her legs hurt really bad all the time since she started with the diarrhea.  Patient wasn't able to sleep at all last night due to her legs.  Patient said she still has a slight cough. Patient's eating jello and crackers.  Patient said she feels bad all the time.  [facility]

## 2020-03-01 NOTE — Telephone Encounter (Signed)
Noted, labs ordered and pending.

## 2020-03-01 NOTE — Telephone Encounter (Signed)
Please have her scheduled for some labs.  How often is she experiencing diarrhea? Once daily? Twice daily? More? Is the leg pain new? Is she drinking water? Any recent antibiotic use?

## 2020-03-03 ENCOUNTER — Other Ambulatory Visit (INDEPENDENT_AMBULATORY_CARE_PROVIDER_SITE_OTHER): Payer: Medicare Other

## 2020-03-03 ENCOUNTER — Other Ambulatory Visit: Payer: Self-pay

## 2020-03-03 DIAGNOSIS — R197 Diarrhea, unspecified: Secondary | ICD-10-CM

## 2020-03-03 LAB — CBC WITH DIFFERENTIAL/PLATELET
Basophils Absolute: 0.1 10*3/uL (ref 0.0–0.1)
Basophils Relative: 1 % (ref 0.0–3.0)
Eosinophils Absolute: 0.3 10*3/uL (ref 0.0–0.7)
Eosinophils Relative: 3.1 % (ref 0.0–5.0)
HCT: 36 % (ref 36.0–46.0)
Hemoglobin: 12 g/dL (ref 12.0–15.0)
Lymphocytes Relative: 17.2 % (ref 12.0–46.0)
Lymphs Abs: 1.6 10*3/uL (ref 0.7–4.0)
MCHC: 33.5 g/dL (ref 30.0–36.0)
MCV: 91.8 fl (ref 78.0–100.0)
Monocytes Absolute: 1 10*3/uL (ref 0.1–1.0)
Monocytes Relative: 11 % (ref 3.0–12.0)
Neutro Abs: 6.2 10*3/uL (ref 1.4–7.7)
Neutrophils Relative %: 67.7 % (ref 43.0–77.0)
Platelets: 237 10*3/uL (ref 150.0–400.0)
RBC: 3.92 Mil/uL (ref 3.87–5.11)
RDW: 13.1 % (ref 11.5–15.5)
WBC: 9.2 10*3/uL (ref 4.0–10.5)

## 2020-03-03 LAB — BASIC METABOLIC PANEL
BUN: 22 mg/dL (ref 6–23)
CO2: 30 mEq/L (ref 19–32)
Calcium: 8.8 mg/dL (ref 8.4–10.5)
Chloride: 106 mEq/L (ref 96–112)
Creatinine, Ser: 0.75 mg/dL (ref 0.40–1.20)
GFR: 74.02 mL/min (ref >60.00)
Glucose, Bld: 88 mg/dL (ref 70–99)
Potassium: 4.7 mEq/L (ref 3.5–5.1)
Sodium: 141 mEq/L (ref 135–145)

## 2020-03-07 ENCOUNTER — Other Ambulatory Visit: Payer: Medicare Other

## 2020-03-07 DIAGNOSIS — R197 Diarrhea, unspecified: Secondary | ICD-10-CM

## 2020-03-08 DIAGNOSIS — M545 Low back pain: Secondary | ICD-10-CM | POA: Diagnosis not present

## 2020-03-08 DIAGNOSIS — R293 Abnormal posture: Secondary | ICD-10-CM | POA: Diagnosis not present

## 2020-03-09 LAB — GASTROINTESTINAL PATHOGEN PANEL PCR
C. difficile Tox A/B, PCR: NOT DETECTED
Campylobacter, PCR: NOT DETECTED
Cryptosporidium, PCR: NOT DETECTED
E coli (ETEC) LT/ST PCR: NOT DETECTED
E coli (STEC) stx1/stx2, PCR: NOT DETECTED
E coli 0157, PCR: NOT DETECTED
Giardia lamblia, PCR: NOT DETECTED
Norovirus, PCR: NOT DETECTED
Rotavirus A, PCR: NOT DETECTED
Salmonella, PCR: NOT DETECTED
Shigella, PCR: NOT DETECTED

## 2020-03-14 DIAGNOSIS — M545 Low back pain: Secondary | ICD-10-CM | POA: Diagnosis not present

## 2020-03-14 DIAGNOSIS — R293 Abnormal posture: Secondary | ICD-10-CM | POA: Diagnosis not present

## 2020-03-15 ENCOUNTER — Encounter: Payer: Self-pay | Admitting: Primary Care

## 2020-03-15 ENCOUNTER — Ambulatory Visit (INDEPENDENT_AMBULATORY_CARE_PROVIDER_SITE_OTHER): Payer: Medicare Other | Admitting: Primary Care

## 2020-03-15 ENCOUNTER — Other Ambulatory Visit: Payer: Self-pay

## 2020-03-15 VITALS — BP 118/80 | HR 68 | Temp 95.8°F | Ht 60.0 in | Wt 125.2 lb

## 2020-03-15 DIAGNOSIS — R197 Diarrhea, unspecified: Secondary | ICD-10-CM | POA: Diagnosis not present

## 2020-03-15 DIAGNOSIS — E538 Deficiency of other specified B group vitamins: Secondary | ICD-10-CM | POA: Diagnosis not present

## 2020-03-15 DIAGNOSIS — F419 Anxiety disorder, unspecified: Secondary | ICD-10-CM

## 2020-03-15 DIAGNOSIS — R5383 Other fatigue: Secondary | ICD-10-CM

## 2020-03-15 DIAGNOSIS — G4730 Sleep apnea, unspecified: Secondary | ICD-10-CM | POA: Diagnosis not present

## 2020-03-15 LAB — VITAMIN B12: Vitamin B-12: 1107 pg/mL — ABNORMAL HIGH (ref 211–911)

## 2020-03-15 LAB — TSH: TSH: 1.53 u[IU]/mL (ref 0.35–4.50)

## 2020-03-15 MED ORDER — CITALOPRAM HYDROBROMIDE 40 MG PO TABS: 40.0000 mg | ORAL_TABLET | Freq: Every day | ORAL | 1 refills | Status: DC

## 2020-03-15 NOTE — Assessment & Plan Note (Signed)
Uncontrolled with daily worry, nervousness, irritability despite citalopram 20 mg. Discussed options, she would like to try a dose increase to 40 mg.   She does feel as though citalopram has been beneficial. Increase dose to 40 mg, she will update.

## 2020-03-15 NOTE — Assessment & Plan Note (Signed)
Compliant to vitamin B12 daily, repeat level pending.

## 2020-03-15 NOTE — Progress Notes (Signed)
Subjective:    Patient ID: [name], female    DOB: Jan 02, 1936, 84 y.o.   MRN: [id number]  HPI  This visit occurred during the SARS-CoV-2 public health emergency.  Safety protocols were in place, including screening questions prior to the visit, additional usage of staff PPE, and extensive cleaning of exam room while observing appropriate contact time as indicated for disinfecting solutions.   [name] is a 84 year old female with a history of hypertension, pulmonary fibrosis, acute diarrhea, anxiety, urinary incontinence, hyperlipidemia who presents today to discuss a few issues.  1) Fatigue: She's feeling very tired over the last 2-3 months, despite the activity level. Feels tired "all the time" and could fall asleep when sitting, stopped at a stop light in the car. She feels tired with normal house hold chores such as laundry and dishes. She is sleeping 8-10 hours of sleep nightly, does get up 1-2 times during the night to urinate, falls back asleep right away.  She's been told that she snores, also has chronic shortness of breath that is stable. She is compliant to her oxygen at night. She is following with pulmonology, next visit due soon. She is not very active during the day due to her chronic pain.   2) Chronic Anxiety: Daily anxiety due to her numerous medical problems including her pain and fatigue. She lost her son in law recently due to Covid-19 which has been difficult. She is currently managed on citalopram 20 mg for which she's been taking for years, she does feel as though it is helpful at times.   Symptoms include worry, feeling anxious, irritability. She has never had a dose increase.   BP Readings from Last 3 Encounters:  03/15/20 118/80  02/16/20 134/71  11/16/19 127/71     Review of Systems  Constitutional: Positive for fatigue. Negative for fever and unexpected weight change.  Respiratory:       Chronic SOB, no change  Cardiovascular: Negative for  chest pain, palpitations and leg swelling.  Neurological: Negative for dizziness and light-headedness.  Psychiatric/Behavioral: Negative for sleep disturbance. The patient is nervous/anxious.        Past Medical History:  Diagnosis Date  . Altered mental state   . Anxiety   . BMI 30.0-30.9,adult   . Cellulitis   . Chest pain, atypical   . Compression fracture of L4 lumbar vertebra   . Constipation   . Cystitis   . Cystocele   . Diverticulosis   . Fatigue   . Fibrosis, idiopathic pulmonary ([place])   . Gastroenteritis   . History of back surgery   . History of herniated intervertebral disc   . HTN (hypertension)   . Hypercholesteremia   . Hypocalcemia   . Incontinence of urine   . OA (osteoarthritis)    shoulder and back  . Obesity   . Osteopenia   . Pneumonia   . Shortness of breath   . Sleep apnea   . Thrush   . Vitamin D deficiency      Social History   Socioeconomic History  . Marital status: Married    Spouse name: [name]  . Number of children: 2  . Years of education: Not on file  . Highest education level: Not on file  Occupational History  . Not on file  Tobacco Use  . Smoking status: Never Smoker  . Smokeless tobacco: Never Used  Vaping Use  . Vaping Use: Never used  Substance and Sexual Activity  .  Alcohol use: No  . Drug use: No  . Sexual activity: Never    Birth control/protection: None  Other Topics Concern  . Not on file  Social History Narrative  . Not on file   Social Determinants of Health   Financial Resource Strain:   . Difficulty of Paying Living Expenses:   Food Insecurity:   . Worried About Charity fundraiser in the Last Year:   . Arboriculturist in the Last Year:   Transportation Needs:   . Film/video editor (Medical):   Marland Kitchen Lack of Transportation (Non-Medical):   Physical Activity:   . Days of Exercise per Week:   . Minutes of Exercise per Session:   Stress:   . Feeling of Stress :   Social Connections:   .  Frequency of Communication with Friends and Family:   . Frequency of Social Gatherings with Friends and Family:   . Attends Religious Services:   . Active Member of Clubs or Organizations:   . Attends Archivist Meetings:   Marland Kitchen Marital Status:   Intimate Partner Violence:   . Fear of Current or Ex-Partner:   . Emotionally Abused:   Marland Kitchen Physically Abused:   . Sexually Abused:     Past Surgical History:  Procedure Laterality Date  . BACK SURGERY    . CATARACT EXTRACTION W/PHACO Left 04/24/2016   Procedure: CATARACT EXTRACTION PHACO AND INTRAOCULAR LENS PLACEMENT (IOC);  Surgeon: Leandrew Koyanagi, MD;  Location: [place];  Service: Ophthalmology;  Laterality: Left;  . CATARACT EXTRACTION W/PHACO Right 07/14/2017   Procedure: CATARACT EXTRACTION PHACO AND INTRAOCULAR LENS PLACEMENT ([place])  RIGHT;  Surgeon: Leandrew Koyanagi, MD;  Location: [place];  Service: Ophthalmology;  Laterality: Right;  . CHOLECYSTECTOMY    . ESOPHAGOGASTRODUODENOSCOPY (EGD) WITH PROPOFOL N/A 10/13/2018   Procedure: ESOPHAGOGASTRODUODENOSCOPY (EGD) WITH PROPOFOL;  Surgeon: [name], MD;  Location: [facility] ENDOSCOPY;  Service: Gastroenterology;  Laterality: N/A;  . FLEXIBLE SIGMOIDOSCOPY N/A 10/13/2018   Procedure: FLEXIBLE SIGMOIDOSCOPY;  Surgeon: [name], MD;  Location[facility] ENDOSCOPY;  Service: Gastroenterology;  Laterality: N/A;  . FLEXIBLE SIGMOIDOSCOPY N/A 09/09/2019   Procedure: FLEXIBLE SIGMOIDOSCOPY;  Surgeon: [name], MD;  Location: [facility] ENDOSCOPY;  Service: Gastroenterology;  Laterality: N/A;  . ROTATOR CUFF REPAIR Bilateral    left-12/2009; right-03/2009 dr.califf  . TONSILLECTOMY    . TOTAL SHOULDER REPLACEMENT Left     Family History  Problem Relation Age of Onset  . COPD Mother   . Cancer Brother        colon    No Known Allergies  Current Outpatient Medications on File Prior to Visit  Medication Sig Dispense Refill  . CALCIUM PO Take 600 mcg by mouth  daily.    . Cholecalciferol (D3 VITAMIN PO) Take 25 mcg by mouth 2 (two) times daily.    . Cyanocobalamin (B-12) 2500 MCG TABS Take 2,500 mcg by mouth daily.    Marland Kitchen denosumab (PROLIA) 60 MG/ML SOSY injection Inject 60 mg into the skin every 6 (six) months.    . ESBRIET 267 MG TABS Take 6 tablets by mouth daily.     Marland Kitchen HYDROmorphone (DILAUDID) 4 MG tablet Take 4 mg by mouth 3 (three) times daily.     Marland Kitchen moxifloxacin (VIGAMOX) 0.5 % ophthalmic solution 1 drop 3 (three) times daily.    Marland Kitchen nystatin cream (MYCOSTATIN) Apply 1 application topically 2 (two) times daily. 30 g 1  . OXYGEN Inhale 2 L into the lungs  as needed.    . VOLTAREN 1 % GEL   2  . fluticasone (FLONASE) 50 MCG/ACT nasal spray PLACE 1 SPRAY INTO BOTH NOSTRILS 2 (TWO) TIMES DAILY. (Patient not taking: Reported on 03/15/2020) 48 mL 1   No current facility-administered medications on file prior to visit.    BP 118/80   Pulse 68   Temp (!) 95.8 F (35.4 C) (Temporal)   Ht 5' (1.524 m)   Wt 125 lb 4 oz (56.8 kg)   SpO2 97%   BMI 24.46 kg/m    Objective:   Physical Exam Constitutional:      Appearance: Normal appearance. She is not ill-appearing.  Cardiovascular:     Rate and Rhythm: Normal rate and regular rhythm.  Pulmonary:     Effort: Pulmonary effort is normal.     Breath sounds: Normal breath sounds.  Musculoskeletal:     Cervical back: Neck supple.  Skin:    General: Skin is warm and dry.  Neurological:     Mental Status: She is alert.  Psychiatric:     Comments: Appears anxious             Assessment & Plan:

## 2020-03-15 NOTE — Patient Instructions (Signed)
We've increased the dose of your citalopram to 40 mg for anxiety. Take 1 tablet by mouth once daily.  Stop by the lab prior to leaving today. I will notify you of your results once received.   Be sure to increase activity level during the day.  It was a pleasure to see you today!

## 2020-03-15 NOTE — Assessment & Plan Note (Signed)
Resolved

## 2020-03-15 NOTE — Assessment & Plan Note (Addendum)
Chronic, increased over the last 2-3 months. Suspect anxiety and inactivity to be playing a role.  Discussed to increase activity. We will work to gain better control over anxiety. Checking TSH today. Recent CBC and BMP unremarkable.

## 2020-03-15 NOTE — Assessment & Plan Note (Signed)
Follows with pulmonology, compliant to home oxygen. No recent episodes of apnea per her machine.

## 2020-03-16 DIAGNOSIS — R293 Abnormal posture: Secondary | ICD-10-CM | POA: Diagnosis not present

## 2020-03-16 DIAGNOSIS — M545 Low back pain: Secondary | ICD-10-CM | POA: Diagnosis not present

## 2020-03-20 DIAGNOSIS — G894 Chronic pain syndrome: Secondary | ICD-10-CM | POA: Diagnosis not present

## 2020-03-20 DIAGNOSIS — Z79899 Other long term (current) drug therapy: Secondary | ICD-10-CM | POA: Diagnosis not present

## 2020-03-20 DIAGNOSIS — M25519 Pain in unspecified shoulder: Secondary | ICD-10-CM | POA: Diagnosis not present

## 2020-03-20 DIAGNOSIS — M5416 Radiculopathy, lumbar region: Secondary | ICD-10-CM | POA: Diagnosis not present

## 2020-03-22 ENCOUNTER — Encounter: Payer: Medicare Other | Admitting: Obstetrics and Gynecology

## 2020-03-24 NOTE — Telephone Encounter (Signed)
The pt called in and stated that she sent a message in yesterday, I told the pt I see where you sent one. I told the pt to please allow 24-48 hours for a reply. The pt said well this is an urgent message. I told her I understand its urgent for her, but this is the same thing we tell our pts. The pt verbally understood. Please advise

## 2020-03-27 DIAGNOSIS — M7122 Synovial cyst of popliteal space [Baker], left knee: Secondary | ICD-10-CM | POA: Diagnosis not present

## 2020-03-27 DIAGNOSIS — M62562 Muscle wasting and atrophy, not elsewhere classified, left lower leg: Secondary | ICD-10-CM | POA: Diagnosis not present

## 2020-03-27 DIAGNOSIS — M79605 Pain in left leg: Secondary | ICD-10-CM | POA: Diagnosis not present

## 2020-03-29 DIAGNOSIS — M47817 Spondylosis without myelopathy or radiculopathy, lumbosacral region: Secondary | ICD-10-CM | POA: Diagnosis not present

## 2020-04-04 DIAGNOSIS — M545 Low back pain: Secondary | ICD-10-CM | POA: Diagnosis not present

## 2020-04-04 DIAGNOSIS — R293 Abnormal posture: Secondary | ICD-10-CM | POA: Diagnosis not present

## 2020-04-10 DIAGNOSIS — M6281 Muscle weakness (generalized): Secondary | ICD-10-CM | POA: Diagnosis not present

## 2020-04-10 DIAGNOSIS — M19019 Primary osteoarthritis, unspecified shoulder: Secondary | ICD-10-CM | POA: Diagnosis not present

## 2020-04-10 DIAGNOSIS — M1712 Unilateral primary osteoarthritis, left knee: Secondary | ICD-10-CM | POA: Diagnosis not present

## 2020-04-10 DIAGNOSIS — M19012 Primary osteoarthritis, left shoulder: Secondary | ICD-10-CM | POA: Diagnosis not present

## 2020-04-11 DIAGNOSIS — R293 Abnormal posture: Secondary | ICD-10-CM | POA: Diagnosis not present

## 2020-04-11 DIAGNOSIS — M545 Low back pain: Secondary | ICD-10-CM | POA: Diagnosis not present

## 2020-04-12 DIAGNOSIS — M5416 Radiculopathy, lumbar region: Secondary | ICD-10-CM | POA: Diagnosis not present

## 2020-04-12 DIAGNOSIS — M25519 Pain in unspecified shoulder: Secondary | ICD-10-CM | POA: Diagnosis not present

## 2020-04-12 DIAGNOSIS — Z79899 Other long term (current) drug therapy: Secondary | ICD-10-CM | POA: Diagnosis not present

## 2020-04-12 DIAGNOSIS — G894 Chronic pain syndrome: Secondary | ICD-10-CM | POA: Diagnosis not present

## 2020-04-25 ENCOUNTER — Encounter: Payer: Medicare Other | Admitting: Obstetrics and Gynecology

## 2020-04-26 DIAGNOSIS — S46812A Strain of other muscles, fascia and tendons at shoulder and upper arm level, left arm, initial encounter: Secondary | ICD-10-CM | POA: Diagnosis not present

## 2020-04-26 DIAGNOSIS — M818 Other osteoporosis without current pathological fracture: Secondary | ICD-10-CM | POA: Diagnosis not present

## 2020-05-02 ENCOUNTER — Encounter: Payer: Self-pay | Admitting: Obstetrics and Gynecology

## 2020-05-11 DIAGNOSIS — Z23 Encounter for immunization: Secondary | ICD-10-CM | POA: Diagnosis not present

## 2020-05-18 ENCOUNTER — Other Ambulatory Visit: Payer: Self-pay

## 2020-05-18 ENCOUNTER — Encounter: Payer: Self-pay | Admitting: Obstetrics and Gynecology

## 2020-05-18 ENCOUNTER — Ambulatory Visit (INDEPENDENT_AMBULATORY_CARE_PROVIDER_SITE_OTHER): Payer: Medicare Other | Admitting: Obstetrics and Gynecology

## 2020-05-18 VITALS — BP 136/77 | HR 54 | Ht 60.0 in | Wt 127.5 lb

## 2020-05-18 DIAGNOSIS — N814 Uterovaginal prolapse, unspecified: Secondary | ICD-10-CM

## 2020-05-18 DIAGNOSIS — N952 Postmenopausal atrophic vaginitis: Secondary | ICD-10-CM

## 2020-05-18 DIAGNOSIS — R351 Nocturia: Secondary | ICD-10-CM

## 2020-05-18 NOTE — Progress Notes (Signed)
Pt present for follow up for vaginal atrophy and pessary check. Pt stated that she has noticed a great improvement in vaginal itching and discharge since the removal of the pessary.

## 2020-05-18 NOTE — Progress Notes (Signed)
    GYNECOLOGY PROGRESS NOTE  Subjective:    Patient ID: [name], female    DOB: 1936-04-15, 84 y.o.   MRN: [id number]  HPI  Patient is a 84 y.o. G2P2 female who presented today for a follow up.  She has gone ~ 1 months without her pessary (took a hiatus).  Notes that overall she is been doing pretty good.  No longer has vaginal discharge or irritation.  Denies any urinary leakage, but does note that at night she tends to have to go to the restroom up to 2-3 times per night.     The following portions of the patient's history were reviewed and updated as appropriate: allergies, current medications, past family history, past medical history, past social history, past surgical history and problem list.   Review of Systems Pertinent items noted in HPI and remainder of comprehensive ROS otherwise negative.   Objective:   Blood pressure 136/77, pulse (!) 54, height 5' (1.524 m), weight 127 lb 8 oz (57.8 kg).    General appearance: alert and no distress  Abdomen: soft, non-tender. No masses palpable or organomegaly. No suprapubic tenderness.  Pelvis: external genitalia normal, rectovaginal septum normal.  Vagina without discharge.  Mild vaginal atrophy present.  Cervix normal appearing, no lesions and no motion tenderness.  Uterus mobile, nontender, normal shape and size, Grade 2 descensus.  Adnexae non-palpable, nontender bilaterally.     Assessment:   1. Vaginal atrophy   2. Cystocele with uterine descensus   3. Nocturia     Plan:   - Patient feels that she is overall doing well without her pessary. Continues to desire to leave it out for now unless symptoms worsen. - Reviewed medication list, advised that she can discontinue all pessary maintenance medications at this time, can continue Estrace cream for atrophy as needed.  - Nocturia noted with pessary out but patient notes this is manageable at this time.  - RTC in 3 months to follow up     Rubie Maid,  MD [facility]

## 2020-05-25 ENCOUNTER — Telehealth: Payer: Self-pay | Admitting: Primary Care

## 2020-05-25 NOTE — Telephone Encounter (Signed)
Called patient she will go for testing.

## 2020-05-25 NOTE — Telephone Encounter (Signed)
Pt wants to know if she was around a lady at church for homecoming this past Sunday and the lady tested positive for covid should she get tested. She is also not feeling well today and have some issues with her throat.

## 2020-05-26 NOTE — Telephone Encounter (Addendum)
Yes, agree with COVID-19 testing.  See my chart message from 05/25/2020.

## 2020-05-27 ENCOUNTER — Inpatient Hospital Stay
Admission: EM | Admit: 2020-05-27 | Discharge: 2020-06-03 | DRG: 178 | Disposition: A | Discharge status: Home Health | Payer: Medicare Other | Attending: Internal Medicine | Admitting: Internal Medicine

## 2020-05-27 ENCOUNTER — Emergency Department: Payer: Medicare Other

## 2020-05-27 ENCOUNTER — Other Ambulatory Visit: Payer: Self-pay

## 2020-05-27 DIAGNOSIS — R070 Pain in throat: Secondary | ICD-10-CM | POA: Diagnosis not present

## 2020-05-27 DIAGNOSIS — E860 Dehydration: Secondary | ICD-10-CM | POA: Diagnosis not present

## 2020-05-27 DIAGNOSIS — E559 Vitamin D deficiency, unspecified: Secondary | ICD-10-CM | POA: Diagnosis present

## 2020-05-27 DIAGNOSIS — Z9981 Dependence on supplemental oxygen: Secondary | ICD-10-CM

## 2020-05-27 DIAGNOSIS — Z8701 Personal history of pneumonia (recurrent): Secondary | ICD-10-CM

## 2020-05-27 DIAGNOSIS — J8410 Pulmonary fibrosis, unspecified: Secondary | ICD-10-CM | POA: Diagnosis present

## 2020-05-27 DIAGNOSIS — M545 Low back pain, unspecified: Secondary | ICD-10-CM | POA: Diagnosis present

## 2020-05-27 DIAGNOSIS — A0839 Other viral enteritis: Secondary | ICD-10-CM | POA: Diagnosis not present

## 2020-05-27 DIAGNOSIS — F419 Anxiety disorder, unspecified: Secondary | ICD-10-CM | POA: Diagnosis present

## 2020-05-27 DIAGNOSIS — E876 Hypokalemia: Secondary | ICD-10-CM | POA: Diagnosis not present

## 2020-05-27 DIAGNOSIS — F329 Major depressive disorder, single episode, unspecified: Secondary | ICD-10-CM | POA: Diagnosis present

## 2020-05-27 DIAGNOSIS — G8929 Other chronic pain: Secondary | ICD-10-CM | POA: Diagnosis present

## 2020-05-27 DIAGNOSIS — Z209 Contact with and (suspected) exposure to unspecified communicable disease: Secondary | ICD-10-CM | POA: Diagnosis not present

## 2020-05-27 DIAGNOSIS — R531 Weakness: Secondary | ICD-10-CM | POA: Diagnosis not present

## 2020-05-27 DIAGNOSIS — I10 Essential (primary) hypertension: Secondary | ICD-10-CM | POA: Diagnosis present

## 2020-05-27 DIAGNOSIS — R112 Nausea with vomiting, unspecified: Secondary | ICD-10-CM | POA: Diagnosis not present

## 2020-05-27 DIAGNOSIS — M8580 Other specified disorders of bone density and structure, unspecified site: Secondary | ICD-10-CM | POA: Diagnosis present

## 2020-05-27 DIAGNOSIS — K5790 Diverticulosis of intestine, part unspecified, without perforation or abscess without bleeding: Secondary | ICD-10-CM | POA: Diagnosis present

## 2020-05-27 DIAGNOSIS — G4730 Sleep apnea, unspecified: Secondary | ICD-10-CM | POA: Diagnosis present

## 2020-05-27 DIAGNOSIS — M19019 Primary osteoarthritis, unspecified shoulder: Secondary | ICD-10-CM | POA: Diagnosis present

## 2020-05-27 DIAGNOSIS — M47819 Spondylosis without myelopathy or radiculopathy, site unspecified: Secondary | ICD-10-CM | POA: Diagnosis present

## 2020-05-27 DIAGNOSIS — R197 Diarrhea, unspecified: Secondary | ICD-10-CM

## 2020-05-27 DIAGNOSIS — E7800 Pure hypercholesterolemia, unspecified: Secondary | ICD-10-CM | POA: Diagnosis present

## 2020-05-27 DIAGNOSIS — Z79899 Other long term (current) drug therapy: Secondary | ICD-10-CM

## 2020-05-27 DIAGNOSIS — R0902 Hypoxemia: Secondary | ICD-10-CM | POA: Diagnosis not present

## 2020-05-27 DIAGNOSIS — U071 COVID-19: Secondary | ICD-10-CM | POA: Diagnosis not present

## 2020-05-27 LAB — GASTROINTESTINAL PANEL BY PCR, STOOL (REPLACES STOOL CULTURE)

## 2020-05-27 LAB — URINALYSIS, COMPLETE (UACMP) WITH MICROSCOPIC
Bilirubin Urine: NEGATIVE
Glucose, UA: NEGATIVE mg/dL
Ketones, ur: 5 mg/dL — AB
Nitrite: NEGATIVE
Protein, ur: NEGATIVE mg/dL
Specific Gravity, Urine: 1.005 (ref 1.005–1.030)
Squamous Epithelial / HPF: NONE SEEN (ref 0–5)
pH: 9 — ABNORMAL HIGH (ref 5.0–8.0)

## 2020-05-27 LAB — COMPREHENSIVE METABOLIC PANEL
ALT: 22 U/L (ref 0–44)
AST: 38 U/L (ref 15–41)
Albumin: 3.5 g/dL (ref 3.5–5.0)
Alkaline Phosphatase: 65 U/L (ref 38–126)
Anion gap: 13 (ref 5–15)
BUN: 19 mg/dL (ref 8–23)
CO2: 20 mmol/L — ABNORMAL LOW (ref 22–32)
Calcium: 8.9 mg/dL (ref 8.9–10.3)
Chloride: 106 mmol/L (ref 98–111)
Creatinine, Ser: 0.79 mg/dL (ref 0.44–1.00)
GFR calc Af Amer: >60 mL/min (ref >60)
GFR calc non Af Amer: >60 mL/min (ref >60)
Glucose, Bld: 85 mg/dL (ref 70–99)
Potassium: 3.2 mmol/L — ABNORMAL LOW (ref 3.5–5.1)
Sodium: 139 mmol/L (ref 135–145)
Total Bilirubin: 0.8 mg/dL (ref 0.3–1.2)
Total Protein: 7.1 g/dL (ref 6.5–8.1)

## 2020-05-27 LAB — C DIFFICILE QUICK SCREEN W PCR REFLEX
C Diff antigen: NEGATIVE
C Diff interpretation: NOT DETECTED
C Diff toxin: NEGATIVE

## 2020-05-27 LAB — CBC
HCT: 47.6 % — ABNORMAL HIGH (ref 36.0–46.0)
Hemoglobin: 16.4 g/dL — ABNORMAL HIGH (ref 12.0–15.0)
MCH: 31.6 pg (ref 26.0–34.0)
MCHC: 34.5 g/dL (ref 30.0–36.0)
MCV: 91.7 fL (ref 80.0–100.0)
Platelets: 200 10*3/uL (ref 150–400)
RBC: 5.19 MIL/uL — ABNORMAL HIGH (ref 3.87–5.11)
RDW: 13.3 % (ref 11.5–15.5)
WBC: 8.6 10*3/uL (ref 4.0–10.5)
nRBC: 0 % (ref 0.0–0.2)

## 2020-05-27 LAB — TROPONIN I (HIGH SENSITIVITY): Troponin I (High Sensitivity): 9 ng/L (ref <18)

## 2020-05-27 LAB — RESPIRATORY PANEL BY RT PCR (FLU A&B, COVID)
Influenza A by PCR: NEGATIVE
Influenza B by PCR: NEGATIVE
SARS Coronavirus 2 by RT PCR: POSITIVE — AB

## 2020-05-27 MED ORDER — ENOXAPARIN SODIUM 40 MG/0.4ML ~~LOC~~ SOLN
40.0000 mg | SUBCUTANEOUS | Status: DC
  Administered 2020-05-27 – 2020-06-02 (×7): 40 mg via SUBCUTANEOUS
  Filled 2020-05-27 (×7): qty 0.4

## 2020-05-27 MED ORDER — CITALOPRAM HYDROBROMIDE 20 MG PO TABS
40.0000 mg | ORAL_TABLET | Freq: Every day | ORAL | Status: DC
  Administered 2020-05-28 – 2020-06-03 (×7): 40 mg via ORAL
  Filled 2020-05-27 (×7): qty 2

## 2020-05-27 MED ORDER — SODIUM CHLORIDE 0.9 % IV SOLN
1000.0000 mL | Freq: Once | INTRAVENOUS | Status: AC
  Administered 2020-05-27: 1000 mL via INTRAVENOUS

## 2020-05-27 MED ORDER — ONDANSETRON HCL 4 MG/2ML IJ SOLN
4.0000 mg | Freq: Once | INTRAMUSCULAR | Status: AC
  Administered 2020-05-27: 4 mg via INTRAVENOUS
  Filled 2020-05-27: qty 2

## 2020-05-27 MED ORDER — POTASSIUM CHLORIDE 20 MEQ PO PACK
40.0000 meq | PACK | Freq: Once | ORAL | Status: AC
  Administered 2020-05-27: 40 meq via ORAL
  Filled 2020-05-27: qty 2

## 2020-05-27 MED ORDER — SULFASALAZINE 500 MG PO TBEC
500.0000 mg | DELAYED_RELEASE_TABLET | Freq: Four times a day (QID) | ORAL | Status: DC
  Administered 2020-05-27 – 2020-06-03 (×26): 500 mg via ORAL
  Filled 2020-05-27 (×29): qty 1

## 2020-05-27 MED ORDER — DICLOFENAC SODIUM 1 % EX GEL
2.0000 g | Freq: Four times a day (QID) | CUTANEOUS | Status: DC | PRN
  Filled 2020-05-27: qty 100

## 2020-05-27 MED ORDER — CALCIUM POLYCARBOPHIL 625 MG PO TABS
625.0000 mg | ORAL_TABLET | Freq: Every day | ORAL | Status: DC
  Administered 2020-05-27 – 2020-05-31 (×5): 625 mg via ORAL
  Filled 2020-05-27 (×7): qty 1

## 2020-05-27 MED ORDER — VITAMIN D 25 MCG (1000 UNIT) PO TABS
1000.0000 [IU] | ORAL_TABLET | Freq: Every day | ORAL | Status: DC
  Administered 2020-05-28 – 2020-06-03 (×7): 1000 [IU] via ORAL
  Filled 2020-05-27 (×7): qty 1

## 2020-05-27 MED ORDER — ONDANSETRON HCL 4 MG/2ML IJ SOLN
4.0000 mg | Freq: Four times a day (QID) | INTRAMUSCULAR | Status: DC | PRN
  Administered 2020-05-28 – 2020-05-31 (×4): 4 mg via INTRAVENOUS
  Filled 2020-05-27 (×3): qty 2

## 2020-05-27 MED ORDER — LACTATED RINGERS IV BOLUS
1000.0000 mL | Freq: Once | INTRAVENOUS | Status: AC
  Administered 2020-05-27: 1000 mL via INTRAVENOUS

## 2020-05-27 MED ORDER — LOPERAMIDE HCL 2 MG PO CAPS
2.0000 mg | ORAL_CAPSULE | ORAL | Status: DC | PRN
  Administered 2020-05-28 – 2020-05-30 (×2): 2 mg via ORAL
  Filled 2020-05-27 (×2): qty 1

## 2020-05-27 MED ORDER — ACETAMINOPHEN 500 MG PO TABS
1000.0000 mg | ORAL_TABLET | Freq: Once | ORAL | Status: AC
  Administered 2020-05-27: 1000 mg via ORAL
  Filled 2020-05-27: qty 2

## 2020-05-27 MED ORDER — HYDROMORPHONE HCL 2 MG PO TABS
4.0000 mg | ORAL_TABLET | Freq: Three times a day (TID) | ORAL | Status: DC
  Administered 2020-05-27 – 2020-05-29 (×5): 4 mg via ORAL
  Filled 2020-05-27 (×5): qty 2

## 2020-05-27 MED ORDER — VITAMIN B-12 1000 MCG PO TABS
1000.0000 ug | ORAL_TABLET | Freq: Every day | ORAL | Status: DC
  Administered 2020-05-28 – 2020-06-03 (×7): 1000 ug via ORAL
  Filled 2020-05-27 (×7): qty 1

## 2020-05-27 NOTE — ED Provider Notes (Signed)
[facility] Emergency Department Provider Note   ____________________________________________    I have reviewed the triage vital signs and the nursing notes.   HISTORY  Chief Complaint Weakness     HPI [name] is a 84 y.o. female who presents with complaints of weakness, diarrhea, fatigue, sore throat.  Patient reports her symptoms started 4 days ago.  7 days ago she was exposed to Covid positive patient who she ate lunch with.  She was vaccinated in March 2021.  Denies fevers, denies chills.  Initially had sore throat on Tuesday, developed diarrhea shortly thereafter fatigue.  No cough or shortness of breath reported.  No chest pain  Past Medical History:  Diagnosis Date  . Altered mental state   . Anxiety   . BMI 30.0-30.9,adult   . Cellulitis   . Chest pain, atypical   . Compression fracture of L4 lumbar vertebra   . Constipation   . Cystitis   . Cystocele   . Diverticulosis   . Fatigue   . Fibrosis, idiopathic pulmonary ([place])   . Gastroenteritis   . History of back surgery   . History of herniated intervertebral disc   . HTN (hypertension)   . Hypercholesteremia   . Hypocalcemia   . Incontinence of urine   . OA (osteoarthritis)    shoulder and back  . Obesity   . Osteopenia   . Pneumonia   . Shortness of breath   . Sleep apnea   . Thrush   . Vitamin D deficiency     Patient Active Problem List   Diagnosis Date Noted  . Vitamin B 12 deficiency 03/15/2020  . Sensation of fullness in both ears 08/23/2019  . Osteopenia 05/27/2019  . Female cystocele 05/27/2019  . Urinary incontinence 05/27/2019  . Rectal bleeding 10/06/2018  . Fatigue 09/29/2018  . Vitamin D deficiency 03/23/2018  . 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 Tinea corporis 04/26/2016  . Family history of colon cancer requiring screening colonoscopy 04/26/2016  . Slow transit constipation 04/26/2016  . Candidiasis 01/17/2016  . Hypercholesteremia 04/24/2015    . Mild cognitive impairment 04/24/2015  . Compression fracture of L4 lumbar vertebra 04/24/2015  . Vaginal pessary in situ 04/19/2015  . Cystocele with uterine descensus 03/30/2015  . Anxiety 02/02/2015  . Arthritis 02/02/2015  . Hypertension 02/02/2015  . Sleep apnea 02/02/2015  . Postinflammatory pulmonary fibrosis ([place]) 05/04/2014    Past Surgical History:  Procedure Laterality Date  . BACK SURGERY    . CATARACT EXTRACTION W/PHACO Left 04/24/2016   Procedure: CATARACT EXTRACTION PHACO AND INTRAOCULAR LENS PLACEMENT (IOC);  Surgeon: Leandrew Koyanagi, MD;  Location: [place];  Service: Ophthalmology;  Laterality: Left;  . CATARACT EXTRACTION W/PHACO Right 07/14/2017   Procedure: CATARACT EXTRACTION PHACO AND INTRAOCULAR LENS PLACEMENT ([place])  RIGHT;  Surgeon: Leandrew Koyanagi, MD;  Location: [place];  Service: Ophthalmology;  Laterality: Right;  . CHOLECYSTECTOMY    . ESOPHAGOGASTRODUODENOSCOPY (EGD) WITH PROPOFOL N/A 10/13/2018   Procedure: ESOPHAGOGASTRODUODENOSCOPY (EGD) WITH PROPOFOL;  Surgeon: [name], MD;  Location: [facility] ENDOSCOPY;  Service: Gastroenterology;  Laterality: N/A;  . FLEXIBLE SIGMOIDOSCOPY N/A 10/13/2018   Procedure: FLEXIBLE SIGMOIDOSCOPY;  Surgeon: [name], MD;  Location: [facility] ENDOSCOPY;  Service: Gastroenterology;  Laterality: N/A;  . FLEXIBLE SIGMOIDOSCOPY N/A 09/09/2019   Procedure: FLEXIBLE SIGMOIDOSCOPY;  Surgeon: [name], MD;  Location: [facility] ENDOSCOPY;  Service: Gastroenterology;  Laterality: N/A;  . ROTATOR CUFF REPAIR Bilateral    left-12/2009; right-03/2009 dr.califf  .  TONSILLECTOMY    . TOTAL SHOULDER REPLACEMENT Left     Prior to Admission medications   Medication Sig Start Date End Date Taking? Authorizing Provider  CALCIUM PO Take 600 mcg by mouth daily.    [provider]  Cholecalciferol (D3 VITAMIN PO) Take 25 mcg by mouth 2 (two) times daily.    [provider]  citalopram (CELEXA) 40 MG  tablet Take 1 tablet (40 mg total) by mouth daily. For anxiety. 03/15/20   [name], NP  denosumab (PROLIA) 60 MG/ML SOSY injection Inject 60 mg into the skin every 6 (six) months.    [provider]  ESBRIET 267 MG TABS Take 6 tablets by mouth daily.  02/25/17   [provider]  HYDROmorphone (DILAUDID) 4 MG tablet Take 4 mg by mouth 3 (three) times daily.  04/23/19   [provider]  moxifloxacin (VIGAMOX) 0.5 % ophthalmic solution 1 drop 3 (three) times daily. Patient not taking: Reported on 05/18/2020    [provider]  nystatin cream (MYCOSTATIN) Apply 1 application topically 2 (two) times daily. 02/16/20   Rubie Maid, MD  OXYGEN Inhale 2 L into the lungs as needed.    [provider]  vitamin B-12 (CYANOCOBALAMIN) 1000 MCG tablet Take 1000 mcg by mouth 3 times a week    [provider]  VOLTAREN 1 % GEL  12/23/17   [provider]     Allergies Patient has no known allergies.  Family History  Problem Relation Age of Onset  . COPD Mother   . Cancer Brother        colon    Social History Social History   Tobacco Use  . Smoking status: Never Smoker  . Smokeless tobacco: Never Used  Vaping Use  . Vaping Use: Never used  Substance Use Topics  . Alcohol use: No  . Drug use: No    Review of Systems  Constitutional: No fever/chills Eyes: No visual changes.  ENT: No sore throat. Cardiovascular: Denies chest pain. Respiratory: Denies shortness of breath. Gastrointestinal: No abdominal pain.  No nausea, no vomiting.   Genitourinary: Negative for dysuria. Musculoskeletal: Negative for back pain. Skin: Negative for rash. Neurological: Negative for headaches or weakness   ____________________________________________   PHYSICAL EXAM:  VITAL SIGNS: ED Triage Vitals  Enc Vitals Group     BP 05/27/20 1224 95/73     Pulse Rate 05/27/20 1224 73     Resp 05/27/20 1224 16     Temp 05/27/20 1241 98.1 F  (36.7 C)     Temp Source 05/27/20 1241 Oral     SpO2 05/27/20 1224 95 %     Weight 05/27/20 1224 57.8 kg (127 lb 6.8 oz)     Height 05/27/20 1224 1.524 m (5')     Head Circumference --      Peak Flow --      Pain Score 05/27/20 1224 5     Pain Loc --      Pain Edu? --      Excl. in [place]? --     Constitutional: Alert and oriented.   Nose: No congestion/rhinnorhea. Mouth/Throat: Mucous membranes are moist.    Cardiovascular: Normal rate, regular rhythm. Grossly normal heart sounds.  Good peripheral circulation. Respiratory: Normal respiratory effort.  No retractions. Lungs CTAB. Gastrointestinal: Soft and nontender. No distention.   Musculoskeletal:  Warm and well perfused Neurologic:  Normal speech and language. No gross focal neurologic deficits are appreciated.  Skin:  Skin is warm, dry and intact. No rash noted. Psychiatric: Mood and affect are normal. Speech and behavior are normal.  ____________________________________________   LABS (all labs ordered are listed, but only abnormal results are displayed)  Labs Reviewed  RESPIRATORY PANEL BY RT PCR (FLU A&B, COVID) - Abnormal; Notable for the following components:      Result Value   SARS Coronavirus 2 by RT PCR POSITIVE (*)    All other components within normal limits  CBC - Abnormal; Notable for the following components:   RBC 5.19 (*)    Hemoglobin 16.4 (*)    HCT 47.6 (*)    All other components within normal limits  C DIFFICILE QUICK SCREEN W PCR REFLEX  GASTROINTESTINAL PANEL BY PCR, STOOL (REPLACES STOOL CULTURE)  URINALYSIS, COMPLETE (UACMP) WITH MICROSCOPIC  COMPREHENSIVE METABOLIC PANEL  TROPONIN I (HIGH SENSITIVITY)   ____________________________________________  EKG  ED ECG REPORT I, Lavonia Drafts, the attending physician, personally viewed and interpreted this ECG.  Date: 05/27/2020  Rhythm: normal sinus rhythm QRS Axis: normal Intervals: normal ST/T Wave abnormalities: normal Narrative  Interpretation: Occasional PVCs  ____________________________________________  RADIOLOGY  Chest x-ray reviewed by me, no infiltrate effusion or pneumothorax ____________________________________________   PROCEDURES  Procedure(s) performed: No  Procedures   Critical Care performed: No ____________________________________________   INITIAL IMPRESSION / ASSESSMENT AND PLAN / ED COURSE  Pertinent labs & imaging results that were available during my care of the patient were reviewed by me and considered in my medical decision making (see chart for details).  Patient presents with complaints of diarrhea, fatigue, weakness, recent Covid exposure.\  Differential includes infectious diarrhea, COVID-19, other viral etiology, less likely C. difficile.   We will give IV fluids, check Covid swab, obtain labs  Chest x-ray reviewed by me, no for transfusion pneumothorax  ----------------------------------------- 2:21 PM on 05/27/2020 -----------------------------------------  Notified of positive Covid results, CBC is normal, pending the remainder of labs       ____________________________________________   FINAL CLINICAL IMPRESSION(S) / ED DIAGNOSES  Final diagnoses:  COVID-19  Dehydration  Generalized weakness  Diarrhea, unspecified type        Note:  This document was prepared using Dragon voice recognition software and may include unintentional dictation errors.   Lavonia Drafts, MD 05/27/20 [phone]

## 2020-05-27 NOTE — ED Notes (Signed)
Pt yelling, states she "hurts all over" and is "so weak and sick." EDP at bedside as well.

## 2020-05-27 NOTE — ED Notes (Signed)
Transport to floor room 117.AS

## 2020-05-27 NOTE — H&P (Signed)
History and Physical    TANI VIRGO MGQ:[id number] DOB: 1936-02-22 DOA: 05/27/2020  PCP: [name], NP  Patient coming from: Home, lives with husband brother and granddaughter  I have personally briefly reviewed patient's old medical records in [place]  Chief Complaint: Weakness, diarrhea  HPI: [name] is a 84 y.o. female with medical history significant for pulmonary fibrosis with nocturnal hypoxia requiring 2 L, hypertension who presents with concerns of persistent weakness and diarrhea.  She states she was tested for Covid about a week ago and then found out 2 days later that she was positive.  Since then she has been been having persistent weakness, nausea without vomiting and feels like she has nonstop diarrhea.  Has been having decreased p.o. intake.  No fever.  Thinks she contracted Covid from a lady at church.  She reportedly has been vaccinated since March. Saw PCP earlier this week and received prednisone and doxycycline.  ED Course: She was afebrile normotensive on room air.  CBC showed no leukocytosis or leukopenia.  Hemoglobin and HCT appears hemoconcentrated at 16.4 and 47% respectively.  Mild hypokalemia of 3.2.  Creatinine normal at 0.79. C. difficile and GI panel negative.  Chest x-ray negative for acute finding.  Review of Systems: Constitutional: No Weight Change, No Fever ENT/Mouth: No sore throat, No Rhinorrhea Eyes: No Eye Pain, No Vision Changes Cardiovascular: No Chest Pain,+ SOB Respiratory: No Cough, No Sputum, No Wheezing, no Dyspnea  Gastrointestinal: +Nausea, No Vomiting, + Diarrhea, No Constipation, No Pain Genitourinary: no Urinary Incontinence,  Musculoskeletal: No Arthralgias, No Myalgias Skin: No Skin Lesions, No Pruritus, Neuro: +Weakness, No Numbness,  Psych: No Anxiety/Panic, No Depression, + decrease appetite Heme/Lymph: No Bruising, No Bleeding   Past Medical History:  Diagnosis Date  . Altered mental state    . Anxiety   . BMI 30.0-30.9,adult   . Cellulitis   . Chest pain, atypical   . Compression fracture of L4 lumbar vertebra   . Constipation   . Cystitis   . Cystocele   . Diverticulosis   . Fatigue   . Fibrosis, idiopathic pulmonary ([place])   . Gastroenteritis   . History of back surgery   . History of herniated intervertebral disc   . HTN (hypertension)   . Hypercholesteremia   . Hypocalcemia   . Incontinence of urine   . OA (osteoarthritis)    shoulder and back  . Obesity   . Osteopenia   . Pneumonia   . Shortness of breath   . Sleep apnea   . Thrush   . Vitamin D deficiency     Past Surgical History:  Procedure Laterality Date  . BACK SURGERY    . CATARACT EXTRACTION W/PHACO Left 04/24/2016   Procedure: CATARACT EXTRACTION PHACO AND INTRAOCULAR LENS PLACEMENT (IOC);  Surgeon: Leandrew Koyanagi, MD;  Location: [place];  Service: Ophthalmology;  Laterality: Left;  . CATARACT EXTRACTION W/PHACO Right 07/14/2017   Procedure: CATARACT EXTRACTION PHACO AND INTRAOCULAR LENS PLACEMENT ([place])  RIGHT;  Surgeon: Leandrew Koyanagi, MD;  Location: [place];  Service: Ophthalmology;  Laterality: Right;  . CHOLECYSTECTOMY    . ESOPHAGOGASTRODUODENOSCOPY (EGD) WITH PROPOFOL N/A 10/13/2018   Procedure: ESOPHAGOGASTRODUODENOSCOPY (EGD) WITH PROPOFOL;  Surgeon: [name], MD;  Location: [facility] ENDOSCOPY;  Service: Gastroenterology;  Laterality: N/A;  . FLEXIBLE SIGMOIDOSCOPY N/A 10/13/2018   Procedure: FLEXIBLE SIGMOIDOSCOPY;  Surgeon: [name], MD;  Location: [facility] ENDOSCOPY;  Service: Gastroenterology;  Laterality: N/A;  . FLEXIBLE SIGMOIDOSCOPY N/A  09/09/2019   Procedure: FLEXIBLE SIGMOIDOSCOPY;  Surgeon: [name], MD;  Location: [facility] ENDOSCOPY;  Service: Gastroenterology;  Laterality: N/A;  . ROTATOR CUFF REPAIR Bilateral    left-12/2009; right-03/2009 dr.califf  . TONSILLECTOMY    . TOTAL SHOULDER REPLACEMENT Left      reports that she has never smoked.  She has never used smokeless tobacco. She reports that she does not drink alcohol and does not use drugs. Social History  No Known Allergies  Family History  Problem Relation Age of Onset  . COPD Mother   . Cancer Brother        colon     Prior to Admission medications   Medication Sig Start Date End Date Taking? Authorizing Provider  CALCIUM PO Take 600 mcg by mouth daily.    [provider]  Cholecalciferol (D3 VITAMIN PO) Take 25 mcg by mouth 2 (two) times daily.    [provider]  citalopram (CELEXA) 40 MG tablet Take 1 tablet (40 mg total) by mouth daily. For anxiety. 03/15/20   [name], NP  denosumab (PROLIA) 60 MG/ML SOSY injection Inject 60 mg into the skin every 6 (six) months.    [provider]  doxycycline (VIBRA-TABS) 100 MG tablet Take 100 mg by mouth 2 (two) times daily. 05/26/20   [provider]  ESBRIET 267 MG TABS Take 6 tablets by mouth daily.  02/25/17   [provider]  HYDROmorphone (DILAUDID) 4 MG tablet Take 4 mg by mouth 3 (three) times daily.  04/23/19   [provider]  methylPREDNISolone (MEDROL DOSEPAK) 4 MG TBPK tablet Take by mouth. 05/26/20   [provider]  nystatin cream (MYCOSTATIN) Apply 1 application topically 2 (two) times daily. 02/16/20   Rubie Maid, MD  vitamin B-12 (CYANOCOBALAMIN) 1000 MCG tablet Take 1000 mcg by mouth 3 times a week    [provider]  VOLTAREN 1 % GEL  12/23/17   [provider]    Physical Exam: Vitals:   05/27/20 1754 05/27/20 1830 05/27/20 1840 05/27/20 1845  BP: (!) 142/83     Pulse:      Resp:  12  17  Temp:      TempSrc:      SpO2:   100% 95%  Weight:      Height:        Constitutional: NAD, calm, comfortable, fatigue elderly female laying asleep in bed and would fall asleep frequently during evaluation Vitals:   05/27/20 1754 05/27/20 1830 05/27/20 1840 05/27/20 1845  BP: (!) 142/83     Pulse:      Resp:  12  17    Temp:      TempSrc:      SpO2:   100% 95%  Weight:      Height:       Eyes: PERRL, lids and conjunctivae normal ENMT: Mucous membranes are moist.  Neck: normal, supple Respiratory: clear to auscultation bilaterally, no wheezing, no crackles. Normal respiratory effort. No accessory muscle use.  Cardiovascular: Regular rate and rhythm, no murmurs / rubs / gallops. No extremity edema.  Abdomen: no tenderness, no masses palpated.Bowel sounds positive.  Musculoskeletal: no clubbing / cyanosis. No joint deformity upper and lower extremities. Good ROM, no contractures. Normal muscle tone.  Skin: no rashes, lesions, ulcers. No induration. Bruising on thinning skin of upper and lower extremities Neurologic: CN 2-12 grossly intact. Sensation intact. Strength 5/5 in lower extremities. Weak bilateral hand grip. Psychiatric: Normal judgment and  insight. Alert and oriented x 3. Normal mood.     Labs on Admission: I have personally reviewed following labs and imaging studies  CBC: Recent Labs  Lab 05/27/20 1241  WBC 8.6  HGB 16.4*  HCT 47.6*  MCV 91.7  PLT 503   Basic Metabolic Panel: Recent Labs  Lab 05/27/20 1642  NA 139  K 3.2*  CL 106  CO2 20*  GLUCOSE 85  BUN 19  CREATININE 0.79  CALCIUM 8.9   GFR: Estimated Creatinine Clearance: 43.1 mL/min (by C-G formula based on SCr of 0.79 mg/dL). Liver Function Tests: Recent Labs  Lab 05/27/20 1642  AST 38  ALT 22  ALKPHOS 65  BILITOT 0.8  PROT 7.1  ALBUMIN 3.5   No results for input(s): LIPASE, AMYLASE in the last 168 hours. No results for input(s): AMMONIA in the last 168 hours. Coagulation Profile: No results for input(s): INR, PROTIME in the last 168 hours. Cardiac Enzymes: No results for input(s): CKTOTAL, CKMB, CKMBINDEX, TROPONINI in the last 168 hours. BNP (last 3 results) No results for input(s): PROBNP in the last 8760 hours. HbA1C: No results for input(s): HGBA1C in the last 72 hours. CBG: No results for  input(s): GLUCAP in the last 168 hours. Lipid Profile: No results for input(s): CHOL, HDL, LDLCALC, TRIG, CHOLHDL, LDLDIRECT in the last 72 hours. Thyroid Function Tests: No results for input(s): TSH, T4TOTAL, FREET4, T3FREE, THYROIDAB in the last 72 hours. Anemia Panel: No results for input(s): VITAMINB12, FOLATE, FERRITIN, TIBC, IRON, RETICCTPCT in the last 72 hours. Urine analysis:    Component Value Date/Time   COLORURINE AMBER (A) 05/27/2020 1751   APPEARANCEUR CLOUDY (A) 05/27/2020 1751   APPEARANCEUR Clear 02/02/2015 1148   LABSPEC 1.005 05/27/2020 1751   LABSPEC 1.023 02/08/2014 1851   PHURINE 9.0 (H) 05/27/2020 1751   GLUCOSEU NEGATIVE 05/27/2020 1751   GLUCOSEU Negative 02/08/2014 1851   HGBUR LARGE (A) 05/27/2020 1751   BILIRUBINUR NEGATIVE 05/27/2020 1751   BILIRUBINUR neg 02/16/2020 1151   BILIRUBINUR Negative 02/02/2015 1148   BILIRUBINUR Negative 02/08/2014 1851   KETONESUR 5 (A) 05/27/2020 1751   PROTEINUR NEGATIVE 05/27/2020 1751   UROBILINOGEN 0.2 02/16/2020 1151   NITRITE NEGATIVE 05/27/2020 1751   LEUKOCYTESUR MODERATE (A) 05/27/2020 1751   LEUKOCYTESUR 3+ 02/08/2014 1851    Radiological Exams on Admission: DG Chest Port 1 View  Result Date: 05/27/2020 CLINICAL DATA:  84 year old female with weakness. EXAM: PORTABLE CHEST 1 VIEW COMPARISON:  10/07/2018 FINDINGS: The heart size and mediastinal contours are unchanged. Low lung volumes. Unchanged elevation left hemidiaphragm. Slightly increased prominence of the pulmonary vasculature and increased streaky and reticular opacities in the bilateral lung bases, left greater than right. No pleural effusion or pneumothorax. Status post left reverse shoulder arthroplasty. Similar appearing changes after multilevel lumbar vertebral body cement augmentation. IMPRESSION: Similar appearing changes associated with known interstitial lung disease. No acute cardiopulmonary process. Electronically Signed   By: Ruthann Cancer MD    On: 05/27/2020 13:53      Assessment/Plan Gastroenteritis secondary to Covid virus infection 1L LR bolus given in ED PRN antiemetic PRN imodium for diarrhea- negative C.diff and GI panel  Pulmonary fibrosis stable. No worsening respiratory status. continue 2L qHS  HTN Control and stable  DVT prophylaxis:.Lovenox Code Status: Full Family Communication: Plan discussed with patient at bedside  disposition Plan: Home with observation Consults called:  Admission status: Observation  Status is: Observation  The patient remains OBS appropriate and will d/c before 2 midnights.  Dispo: The patient is from: Home              Anticipated d/c is to: Home              Anticipated d/c date is: 1 day              Patient currently is not medically stable to d/c.         Orene Desanctis DO [facility]   If 7PM-7AM, please contact night-coverage [email or web address]   05/27/2020, 7:45 PM

## 2020-05-27 NOTE — ED Notes (Signed)
Husband states someone called him for information regarding "Prolia shot," which he says the pt had August 25.

## 2020-05-27 NOTE — ED Notes (Signed)
Changed patient brief and pad soiled with diarrhea.AS

## 2020-05-27 NOTE — ED Notes (Signed)
Brief changed ?

## 2020-05-27 NOTE — ED Provider Notes (Signed)
Patient received in signout from Dr. Corky Downs for evaluation of generalized weakness and watery diarrhea in the setting of COVID-19.  Patient is not hypoxic and has no inpatient criteria.  Patient continues to be symptomatic in the ED despite fluid resuscitation and antiemetics with frequent diarrhea, complaints of diffuse myalgias and inability to care for herself at home.  I am concerned about lack of safe discharge plan and need for medical observation admission.  Hospitalist paged and will admit to their service for further work-up and management of her through symptomatic COVID-19 without hypoxia.   Vladimir Crofts, MD 05/27/20 [phone]

## 2020-05-27 NOTE — ED Triage Notes (Addendum)
Pt arrives via ems from home, pt c/o weakness, increasing over the past 4 days, sore throat on tues, nausea vomiting and diarrhea, +covid exposure Sunday, pt reports being fully vaccinated in march 2021 Pt talked with her pcp yesterday due to routine diagnosis this time of year for laryngitis, pt was started on prednisone and doxycycline, pt has had one dose of each

## 2020-05-27 NOTE — ED Notes (Signed)
Pt states she uses oxygen at night chronically, 2 liter's [place] applied.

## 2020-05-27 NOTE — ED Notes (Signed)
Brief an linens changed

## 2020-05-27 NOTE — ED Notes (Signed)
Pt cleaned, new gown and brief applied.

## 2020-05-27 NOTE — ED Notes (Signed)
Husband updated by phone

## 2020-05-27 NOTE — ED Notes (Signed)
Green top resent to lab.

## 2020-05-27 NOTE — ED Notes (Addendum)
Pt assisted to toilet and given additional blankets.

## 2020-05-27 NOTE — ED Notes (Signed)
Pt cleaned of diarrhea, brief applied

## 2020-05-28 DIAGNOSIS — Z9981 Dependence on supplemental oxygen: Secondary | ICD-10-CM | POA: Diagnosis not present

## 2020-05-28 DIAGNOSIS — R0602 Shortness of breath: Secondary | ICD-10-CM | POA: Diagnosis not present

## 2020-05-28 DIAGNOSIS — U071 COVID-19: Secondary | ICD-10-CM | POA: Diagnosis present

## 2020-05-28 DIAGNOSIS — E7800 Pure hypercholesterolemia, unspecified: Secondary | ICD-10-CM | POA: Diagnosis present

## 2020-05-28 DIAGNOSIS — E876 Hypokalemia: Secondary | ICD-10-CM | POA: Diagnosis present

## 2020-05-28 DIAGNOSIS — Z79899 Other long term (current) drug therapy: Secondary | ICD-10-CM | POA: Diagnosis not present

## 2020-05-28 DIAGNOSIS — M8580 Other specified disorders of bone density and structure, unspecified site: Secondary | ICD-10-CM | POA: Diagnosis present

## 2020-05-28 DIAGNOSIS — M47819 Spondylosis without myelopathy or radiculopathy, site unspecified: Secondary | ICD-10-CM | POA: Diagnosis present

## 2020-05-28 DIAGNOSIS — I479 Paroxysmal tachycardia, unspecified: Secondary | ICD-10-CM | POA: Diagnosis not present

## 2020-05-28 DIAGNOSIS — A0839 Other viral enteritis: Secondary | ICD-10-CM | POA: Diagnosis present

## 2020-05-28 DIAGNOSIS — E559 Vitamin D deficiency, unspecified: Secondary | ICD-10-CM | POA: Diagnosis present

## 2020-05-28 DIAGNOSIS — F419 Anxiety disorder, unspecified: Secondary | ICD-10-CM | POA: Diagnosis present

## 2020-05-28 DIAGNOSIS — M545 Low back pain, unspecified: Secondary | ICD-10-CM | POA: Diagnosis present

## 2020-05-28 DIAGNOSIS — G4730 Sleep apnea, unspecified: Secondary | ICD-10-CM | POA: Diagnosis present

## 2020-05-28 DIAGNOSIS — R9431 Abnormal electrocardiogram [ECG] [EKG]: Secondary | ICD-10-CM | POA: Diagnosis not present

## 2020-05-28 DIAGNOSIS — E860 Dehydration: Secondary | ICD-10-CM | POA: Diagnosis present

## 2020-05-28 DIAGNOSIS — R519 Headache, unspecified: Secondary | ICD-10-CM | POA: Diagnosis not present

## 2020-05-28 DIAGNOSIS — G8929 Other chronic pain: Secondary | ICD-10-CM | POA: Diagnosis present

## 2020-05-28 DIAGNOSIS — Z8701 Personal history of pneumonia (recurrent): Secondary | ICD-10-CM | POA: Diagnosis not present

## 2020-05-28 DIAGNOSIS — K5790 Diverticulosis of intestine, part unspecified, without perforation or abscess without bleeding: Secondary | ICD-10-CM | POA: Diagnosis present

## 2020-05-28 DIAGNOSIS — M19019 Primary osteoarthritis, unspecified shoulder: Secondary | ICD-10-CM | POA: Diagnosis present

## 2020-05-28 DIAGNOSIS — F329 Major depressive disorder, single episode, unspecified: Secondary | ICD-10-CM | POA: Diagnosis present

## 2020-05-28 DIAGNOSIS — I10 Essential (primary) hypertension: Secondary | ICD-10-CM | POA: Diagnosis present

## 2020-05-28 DIAGNOSIS — J8410 Pulmonary fibrosis, unspecified: Secondary | ICD-10-CM | POA: Diagnosis present

## 2020-05-28 HISTORY — DX: COVID-19: U07.1

## 2020-05-28 LAB — PHOSPHORUS: Phosphorus: 3.3 mg/dL (ref 2.5–4.6)

## 2020-05-28 LAB — COMPREHENSIVE METABOLIC PANEL WITH GFR
ALT: 21 U/L (ref 0–44)
AST: 38 U/L (ref 15–41)
Albumin: 3.3 g/dL — ABNORMAL LOW (ref 3.5–5.0)
Alkaline Phosphatase: 57 U/L (ref 38–126)
Anion gap: 11 (ref 5–15)
BUN: 23 mg/dL (ref 8–23)
CO2: 22 mmol/L (ref 22–32)
Calcium: 8.6 mg/dL — ABNORMAL LOW (ref 8.9–10.3)
Chloride: 109 mmol/L (ref 98–111)
Creatinine, Ser: 0.87 mg/dL (ref 0.44–1.00)
GFR calc Af Amer: >60 mL/min
GFR calc non Af Amer: >60 mL/min
Glucose, Bld: 75 mg/dL (ref 70–99)
Potassium: 3.7 mmol/L (ref 3.5–5.1)
Sodium: 142 mmol/L (ref 135–145)
Total Bilirubin: 0.8 mg/dL (ref 0.3–1.2)
Total Protein: 6.8 g/dL (ref 6.5–8.1)

## 2020-05-28 LAB — CBC WITH DIFFERENTIAL/PLATELET
Abs Immature Granulocytes: 0.01 10*3/uL (ref 0.00–0.07)
Basophils Absolute: 0 10*3/uL (ref 0.0–0.1)
Basophils Relative: 0 %
Eosinophils Absolute: 0 10*3/uL (ref 0.0–0.5)
Eosinophils Relative: 0 %
HCT: 45.3 % (ref 36.0–46.0)
Hemoglobin: 14.9 g/dL (ref 12.0–15.0)
Immature Granulocytes: 0 %
Lymphocytes Relative: 24 %
Lymphs Abs: 1.3 10*3/uL (ref 0.7–4.0)
MCH: 31.3 pg (ref 26.0–34.0)
MCHC: 32.9 g/dL (ref 30.0–36.0)
MCV: 95.2 fL (ref 80.0–100.0)
Monocytes Absolute: 0.7 10*3/uL (ref 0.1–1.0)
Monocytes Relative: 14 %
Neutro Abs: 3.3 10*3/uL (ref 1.7–7.7)
Neutrophils Relative %: 62 %
Platelets: 200 10*3/uL (ref 150–400)
RBC: 4.76 MIL/uL (ref 3.87–5.11)
RDW: 13.4 % (ref 11.5–15.5)
WBC: 5.3 10*3/uL (ref 4.0–10.5)
nRBC: 0 % (ref 0.0–0.2)

## 2020-05-28 LAB — MAGNESIUM: Magnesium: 2.1 mg/dL (ref 1.7–2.4)

## 2020-05-28 LAB — C-REACTIVE PROTEIN: CRP: 0.7 mg/dL (ref <1.0)

## 2020-05-28 MED ORDER — ACETAMINOPHEN 325 MG PO TABS
650.0000 mg | ORAL_TABLET | Freq: Four times a day (QID) | ORAL | Status: DC | PRN
  Administered 2020-05-28 – 2020-06-01 (×3): 650 mg via ORAL
  Filled 2020-05-28 (×4): qty 2

## 2020-05-28 MED ORDER — SODIUM CHLORIDE 0.9 % IV SOLN
1000.0000 mL | Freq: Once | INTRAVENOUS | Status: AC
  Administered 2020-05-28: 1000 mL via INTRAVENOUS

## 2020-05-28 MED ORDER — MORPHINE SULFATE (PF) 2 MG/ML IV SOLN: 2.0000 mg | INTRAVENOUS | Status: DC | PRN

## 2020-05-28 MED ORDER — PANTOPRAZOLE SODIUM 40 MG IV SOLR
40.0000 mg | Freq: Every day | INTRAVENOUS | Status: DC
  Administered 2020-05-28 – 2020-06-01 (×5): 40 mg via INTRAVENOUS
  Filled 2020-05-28 (×5): qty 40

## 2020-05-28 MED ORDER — ZINC OXIDE 40 % EX OINT
TOPICAL_OINTMENT | CUTANEOUS | Status: DC | PRN
  Filled 2020-05-28 (×3): qty 113

## 2020-05-28 NOTE — Progress Notes (Signed)
MD notified bladder scan = 304, pt feels "urge"

## 2020-05-28 NOTE — Progress Notes (Signed)
Pt stated she has not "peed in days."  To bladder scan at this time

## 2020-05-28 NOTE — Progress Notes (Signed)
PROGRESS NOTE    [name]  SAY:[id number] DOB: 1936/04/21 DOA: 05/27/2020 PCP: [name], NP   Brief Narrative:  [name] is a 84 y.o. female with medical history significant for pulmonary fibrosis with nocturnal hypoxia requiring 2 L, hypertension who presents with concerns of persistent weakness and diarrhea.  She states she was tested for Covid about a week ago and then found out 2 days later that she was positive.  Since then she has been been having persistent weakness, nausea without vomiting and feels like she has nonstop diarrhea.  Has been having decreased p.o. intake.  No fever.  Thinks she contracted Covid from a lady at church.  She reportedly has been vaccinated since March. Saw PCP earlier this week and received prednisone and doxycycline.  ED Course: She was afebrile normotensive on room air.  CBC showed no leukocytosis or leukopenia.  Hemoglobin and HCT appears hemoconcentrated at 16.4 and 47% respectively.  Mild hypokalemia of 3.2.  Creatinine normal at 0.79. C. difficile and GI panel negative.  Chest x-ray negative for acute finding.  Assessment & Plan:   Principal Problem:   Gastroenteritis due to COVID-19 virus Active Problems:   Hypertension   Postinflammatory pulmonary fibrosis (HCC)   Gastroenteritis secondary to Covid virus infection 1L LR bolus given in ED PRN antiemetic PRN imodium for diarrhea- negative C.diff and GI panel. 9/26 Gi panel neg so far suspect diarrhea from viral GE. We will continue with ivf hydration.  Pulmonary fibrosis stable. No worsening respiratory status. continue 2L qHS  HTN Control and stable.  pt does not have htn and is not on any treatment.    DVT prophylaxis:.Lovenox Code Status: Full Family Communication: Plan discussed with patient at bedside  disposition Plan: Home with observation Consults called:  Admission status: Observation  DVT prophylaxis: lovenox Family Communication:    Disposition Plan:   Status is: Observation  The patient remains OBS appropriate and will d/c before 2 midnights.  Dispo: The patient is from: Home              Anticipated d/c is to: Home              Anticipated d/c date is: 3 days              Patient currently is not medically stable to d/c.    Consultants:   None  Procedures: None  Antimicrobials:  Anti-infectives (From admission, onward)   None     Subjective: Pt seen for f/u she is alert but weak and will need aggressive PT for her weaknesss  Objective: Vitals:   05/28/20 0505 05/28/20 0508 05/28/20 0735 05/28/20 1302  BP: (!) 115/55  117/65 110/78  Pulse:   (!) 57 (!) 55  Resp:   18 19  Temp:   98.1 F (36.7 C) 98 F (36.7 C)  TempSrc:   Oral Oral  SpO2:  100% 99% 95%  Weight:      Height:        Intake/Output Summary (Last 24 hours) at 05/28/2020 1305 Last data filed at 05/27/2020 1505 Gross per 24 hour  Intake 990 ml  Output --  Net 990 ml   Filed Weights   05/27/20 1224  Weight: 57.8 kg   Examination: Blood pressure 110/78, pulse (!) 55, temperature 98 F (36.7 C), temperature source Oral, resp. rate 19, height 5' (1.524 m), weight 57.8 kg, SpO2 95 %. General exam: Appears calm and comfortable  Respiratory system: Clear  to auscultation. Respiratory effort normal. Cardiovascular system: S1 & S2 heard, RRR. No JVD, murmurs, rubs, gallops or clicks. No pedal edema. Gastrointestinal system: Abdomen is nondistended, soft and nontender. No organomegaly or masses felt. Normal bowel sounds heard. Central nervous system: Alert and oriented. No focal neurological deficits. Extremities: Pt moving all extremity and hold herself up during exam. Skin: No rashes, lesions or ulcers Psychiatry: Judgement and insight appear normal. Mood & affect appropriate.  Data Reviewed: I have personally reviewed following labs and imaging studies  I/O last 3 completed shifts: In: 990 [I.V.:990] Out: -  No  intake/output data recorded. Lab Results  Component Value Date   CREATININE 0.87 05/28/2020   CREATININE 0.79 05/27/2020   CREATININE 0.75 03/03/2020   CBC: Recent Labs  Lab 05/27/20 1241 05/28/20 0432  WBC 8.6 5.3  NEUTROABS  --  3.3  HGB 16.4* 14.9  HCT 47.6* 45.3  MCV 91.7 95.2  PLT 200 585   Basic Metabolic Panel: Recent Labs  Lab 05/27/20 1642 05/28/20 0432  NA 139 142  K 3.2* 3.7  CL 106 109  CO2 20* 22  GLUCOSE 85 75  BUN 19 23  CREATININE 0.79 0.87  CALCIUM 8.9 8.6*  MG  --  2.1  PHOS  --  3.3   GFR: Estimated Creatinine Clearance: 39.7 mL/min (by C-G formula based on SCr of 0.87 mg/dL). Liver Function Tests: Recent Labs  Lab 05/27/20 1642 05/28/20 0432  AST 38 38  ALT 22 21  ALKPHOS 65 57  BILITOT 0.8 0.8  PROT 7.1 6.8  ALBUMIN 3.5 3.3*   No results for input(s): LIPASE, AMYLASE in the last 168 hours. No results for input(s): AMMONIA in the last 168 hours. Coagulation Profile: No results for input(s): INR, PROTIME in the last 168 hours. Cardiac Enzymes: No results for input(s): CKTOTAL, CKMB, CKMBINDEX, TROPONINI in the last 168 hours. BNP (last 3 results) No results for input(s): PROBNP in the last 8760 hours. HbA1C: No results for input(s): HGBA1C in the last 72 hours. CBG: No results for input(s): GLUCAP in the last 168 hours. Lipid Profile: No results for input(s): CHOL, HDL, LDLCALC, TRIG, CHOLHDL, LDLDIRECT in the last 72 hours. Thyroid Function Tests: No results for input(s): TSH, T4TOTAL, FREET4, T3FREE, THYROIDAB in the last 72 hours. Anemia Panel: No results for input(s): VITAMINB12, FOLATE, FERRITIN, TIBC, IRON, RETICCTPCT in the last 72 hours. Sepsis Labs: No results for input(s): PROCALCITON, LATICACIDVEN in the last 168 hours.  Recent Results (from the past 240 hour(s))  Respiratory Panel by RT PCR (Flu A&B, Covid) - Nasopharyngeal Swab     Status: Abnormal   Collection Time: 05/27/20 12:41 PM   Specimen: Nasopharyngeal  Swab  Result Value Ref Range Status   SARS Coronavirus 2 by RT PCR POSITIVE (A) NEGATIVE Final    Comment: RESULT CALLED TO, READ BACK BY AND VERIFIED WITH: Laure Kidney RN AT 2778 ON 05/27/20 SNG (NOTE) SARS-CoV-2 target nucleic acids are DETECTED.  SARS-CoV-2 RNA is generally detectable in upper respiratory specimens  during the acute phase of infection. Positive results are indicative of the presence of the identified virus, but do not rule out bacterial infection or co-infection with other pathogens not detected by the test. Clinical correlation with patient history and other diagnostic information is necessary to determine patient infection status. The expected result is Negative.  Fact Sheet for Patients:  PinkCheek.be  Fact Sheet for Healthcare Providers: GravelBags.it  This test is not yet approved or cleared by the [place]  States FDA and  has been authorized for detection and/or diagnosis of SARS-CoV-2 by FDA under an Emergency Use Authorization (EUA).  This EUA will remain in effect (meaning this test can  be used) for the duration of  the COVID-19 declaration under Section 564(b)(1) of the Act, 21 U.S.C. section 360bbb-3(b)(1), unless the authorization is terminated or revoked sooner.      Influenza A by PCR NEGATIVE NEGATIVE Final   Influenza B by PCR NEGATIVE NEGATIVE Final    Comment: (NOTE) The Xpert Xpress SARS-CoV-2/FLU/RSV assay is intended as an aid in  the diagnosis of influenza from Nasopharyngeal swab specimens and  should not be used as a sole basis for treatment. Nasal washings and  aspirates are unacceptable for Xpert Xpress SARS-CoV-2/FLU/RSV  testing.  Fact Sheet for Patients: PinkCheek.be  Fact Sheet for Healthcare Providers: GravelBags.it  This test is not yet approved or cleared by the [place] FDA and  has been authorized  for detection and/or diagnosis of SARS-CoV-2 by  FDA under an Emergency Use Authorization (EUA). This EUA will remain  in effect (meaning this test can be used) for the duration of the  Covid-19 declaration under Section 564(b)(1) of the Act, 21  U.S.C. section 360bbb-3(b)(1), unless the authorization is  terminated or revoked. Performed at [facility], [place], [place] [address]   C Difficile Quick Screen w PCR reflex     Status: None   Collection Time: 05/27/20  1:53 PM   Specimen: STOOL  Result Value Ref Range Status   C Diff antigen NEGATIVE NEGATIVE Final   C Diff toxin NEGATIVE NEGATIVE Final   C Diff interpretation No C. difficile detected.  Final    Comment: Performed at [facility], [address] [place], [place] [address]  Gastrointestinal Panel by PCR , Stool     Status: None   Collection Time: 05/27/20  1:53 PM   Specimen: STOOL  Result Value Ref Range Status   Campylobacter species NOT DETECTED NOT DETECTED Final   Plesimonas shigelloides NOT DETECTED NOT DETECTED Final   Salmonella species NOT DETECTED NOT DETECTED Final   Yersinia enterocolitica NOT DETECTED NOT DETECTED Final   Vibrio species NOT DETECTED NOT DETECTED Final   Vibrio cholerae NOT DETECTED NOT DETECTED Final   Enteroaggregative E coli (EAEC) NOT DETECTED NOT DETECTED Final   Enteropathogenic E coli (EPEC) NOT DETECTED NOT DETECTED Final   Enterotoxigenic E coli (ETEC) NOT DETECTED NOT DETECTED Final   Shiga like toxin producing E coli (STEC) NOT DETECTED NOT DETECTED Final   Shigella/Enteroinvasive E coli (EIEC) NOT DETECTED NOT DETECTED Final   Cryptosporidium NOT DETECTED NOT DETECTED Final   Cyclospora cayetanensis NOT DETECTED NOT DETECTED Final   Entamoeba histolytica NOT DETECTED NOT DETECTED Final   Giardia lamblia NOT DETECTED NOT DETECTED Final   Adenovirus F40/41 NOT DETECTED NOT DETECTED Final   Astrovirus NOT DETECTED NOT DETECTED Final    Norovirus GI/GII NOT DETECTED NOT DETECTED Final   Rotavirus A NOT DETECTED NOT DETECTED Final   Sapovirus (I, II, IV, and V) NOT DETECTED NOT DETECTED Final    Comment: Performed at [facility], [address], [place], [place] [address]    Radiology Studies: DG Chest Port 1 View  Result Date: 05/27/2020 CLINICAL DATA:  84 year old female with weakness. EXAM: PORTABLE CHEST 1 VIEW COMPARISON:  10/07/2018 FINDINGS: The heart size and mediastinal contours are unchanged. Low lung volumes. Unchanged elevation left hemidiaphragm. Slightly increased prominence of  the pulmonary vasculature and increased streaky and reticular opacities in the bilateral lung bases, left greater than right. No pleural effusion or pneumothorax. Status post left reverse shoulder arthroplasty. Similar appearing changes after multilevel lumbar vertebral body cement augmentation. IMPRESSION: Similar appearing changes associated with known interstitial lung disease. No acute cardiopulmonary process. Electronically Signed   By: Ruthann Cancer MD   On: 05/27/2020 13:53   Scheduled Meds . cholecalciferol  1,000 Units Oral Daily  . citalopram  40 mg Oral Daily  . enoxaparin (LOVENOX) injection  40 mg Subcutaneous Q24H  . HYDROmorphone  4 mg Oral TID  . pantoprazole (PROTONIX) IV  40 mg Intravenous Daily  . polycarbophil  625 mg Oral Daily  . sulfaSALAzine  500 mg Oral QID  . vitamin B-12  1,000 mcg Oral Daily   Continuous Infusions:   LOS: 0 days    Para Skeans, MD [facility] Pager [phone] If 7PM-7AM, please contact night-coverage [email or web address] Password [email or web address] 05/28/2020, 1:05 PM

## 2020-05-28 NOTE — TOC Initial Note (Signed)
Transition of Care [facility]) - Initial/Assessment Note    Patient Details  Name: [name] MRN: [id number] Date of Birth: Jun 06, 1936  Transition of Care [facility]) CM/SW Contact:    Shelbie Hutching, RN Phone Number: 05/28/2020, 11:47 AM  Clinical Narrative:                 Patient placed under observation for COVID 19.  RNCM was unable to get the patient on the phone but was able to speak with the patient's husband via phone.  Patient's husband, [name], reports that patient is feeling very weak today.  Patient is from home and lives with her husband and adult son.  Patient is independent at baseline and drives.  Patient is current with her PCP.  Patient's husband thinks that patient will benefit from home health services at discharge.  RNCM will attempt to reach patient via phone later today to offer choice of agency.  Potential discharge for tomorrow.    Expected Discharge Plan: [place] Barriers to Discharge: Continued Medical Work up   Patient Goals and CMS Choice   CMS Medicare.gov Compare Post Acute Care list provided to:: Patient Choice offered to / list presented to : Patient  Expected Discharge Plan and Services Expected Discharge Plan: [place]   Discharge Planning Services: CM Consult Post Acute Care Choice: [place] arrangements for the past 2 months: Single Family Home                                      Prior Living Arrangements/Services Living arrangements for the past 2 months: Single Family Home Lives with:: Spouse, Adult Children Patient language and need for interpreter reviewed:: Yes Do you feel safe going back to the place where you live?: Yes      Need for Family Participation in Patient Care: Yes (Comment) (COVID) Care giver support system in place?: Yes (comment) (husband and son)   Criminal Activity/Legal Involvement Pertinent to Current Situation/Hospitalization: No - Comment as  needed  Activities of Daily Living Home Assistive Devices/Equipment: None ADL Screening (condition at time of admission) Patient's cognitive ability adequate to safely complete daily activities?: Yes Is the patient deaf or have difficulty hearing?: No Does the patient have difficulty seeing, even when wearing glasses/contacts?: No Does the patient have difficulty concentrating, remembering, or making decisions?: No Patient able to express need for assistance with ADLs?: No Does the patient have difficulty dressing or bathing?: No Independently performs ADLs?: Yes (appropriate for developmental age) Does the patient have difficulty walking or climbing stairs?: Yes Weakness of Legs: Both Weakness of Arms/Hands: None  Permission Sought/Granted Permission sought to share information with : Case Manager, Family Supports Permission granted to share information with : Yes, Verbal Permission Granted  Share Information with NAME: [name]     Permission granted to share info w Relationship: husband     Emotional Assessment       Orientation: : Oriented to Self, Oriented to Place, Oriented to  Time, Oriented to Situation Alcohol / Substance Use: Not Applicable Psych Involvement: No (comment)  Admission diagnosis:  Dehydration [E86.0] Generalized weakness [R53.1] Diarrhea, unspecified type [R19.7] COVID-19 virus infection [U07.1] COVID-19 [U07.1] Patient Active Problem List   Diagnosis Date Noted  . Gastroenteritis due to COVID-19 virus 05/27/2020  . Vitamin B 12 deficiency 03/15/2020  . Sensation of fullness in both ears 08/23/2019  .  Osteopenia 05/27/2019  . Female cystocele 05/27/2019  . Urinary incontinence 05/27/2019  . Rectal bleeding 10/06/2018  . Fatigue 09/29/2018  . Vitamin D deficiency 03/23/2018  . 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 Tinea corporis 04/26/2016  . Family history of colon cancer requiring screening colonoscopy 04/26/2016  . Slow transit constipation 04/26/2016   . Candidiasis 01/17/2016  . Hypercholesteremia 04/24/2015  . Mild cognitive impairment 04/24/2015  . Compression fracture of L4 lumbar vertebra 04/24/2015  . Vaginal pessary in situ 04/19/2015  . Cystocele with uterine descensus 03/30/2015  . Anxiety 02/02/2015  . Arthritis 02/02/2015  . Hypertension 02/02/2015  . Sleep apnea 02/02/2015  . 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

## 2020-05-28 NOTE — Progress Notes (Signed)
Pt has had zero urine output during my shift.  MD to be notified at this time

## 2020-05-29 ENCOUNTER — Inpatient Hospital Stay: Payer: Medicare Other

## 2020-05-29 ENCOUNTER — Inpatient Hospital Stay
Admit: 2020-05-29 | Discharge: 2020-05-29 | Disposition: A | Discharge status: Home / Self Care | Payer: Medicare Other | Attending: Internal Medicine | Admitting: Internal Medicine

## 2020-05-29 DIAGNOSIS — A0839 Other viral enteritis: Secondary | ICD-10-CM | POA: Diagnosis not present

## 2020-05-29 DIAGNOSIS — U071 COVID-19: Secondary | ICD-10-CM | POA: Diagnosis not present

## 2020-05-29 LAB — ECHOCARDIOGRAM COMPLETE
AR max vel: 1.89 cm2
AV Area VTI: 1.77 cm2
AV Area mean vel: 1.67 cm2
AV Mean grad: 5 mmHg
AV Peak grad: 10.6 mmHg
Ao pk vel: 1.63 m/s
Area-P 1/2: 3.27 cm2
Height: 60 in
S' Lateral: 2.16 cm
Weight: 2038.81 oz

## 2020-05-29 LAB — COMPREHENSIVE METABOLIC PANEL
ALT: 17 U/L (ref 0–44)
AST: 31 U/L (ref 15–41)
Albumin: 2.8 g/dL — ABNORMAL LOW (ref 3.5–5.0)
Alkaline Phosphatase: 46 U/L (ref 38–126)
Anion gap: 9 (ref 5–15)
BUN: 18 mg/dL (ref 8–23)
CO2: 26 mmol/L (ref 22–32)
Calcium: 8 mg/dL — ABNORMAL LOW (ref 8.9–10.3)
Chloride: 105 mmol/L (ref 98–111)
Creatinine, Ser: 0.8 mg/dL (ref 0.44–1.00)
GFR calc Af Amer: >60 mL/min (ref >60)
GFR calc non Af Amer: >60 mL/min (ref >60)
Glucose, Bld: 73 mg/dL (ref 70–99)
Potassium: 4.3 mmol/L (ref 3.5–5.1)
Sodium: 140 mmol/L (ref 135–145)
Total Bilirubin: 0.8 mg/dL (ref 0.3–1.2)
Total Protein: 5.3 g/dL — ABNORMAL LOW (ref 6.5–8.1)

## 2020-05-29 LAB — CBC WITH DIFFERENTIAL/PLATELET
Abs Immature Granulocytes: 0.01 10*3/uL (ref 0.00–0.07)
Basophils Absolute: 0 10*3/uL (ref 0.0–0.1)
Basophils Relative: 0 %
Eosinophils Absolute: 0 10*3/uL (ref 0.0–0.5)
Eosinophils Relative: 1 %
HCT: 38 % (ref 36.0–46.0)
Hemoglobin: 12.4 g/dL (ref 12.0–15.0)
Immature Granulocytes: 0 %
Lymphocytes Relative: 27 %
Lymphs Abs: 1.1 10*3/uL (ref 0.7–4.0)
MCH: 31.2 pg (ref 26.0–34.0)
MCHC: 32.6 g/dL (ref 30.0–36.0)
MCV: 95.5 fL (ref 80.0–100.0)
Monocytes Absolute: 0.6 10*3/uL (ref 0.1–1.0)
Monocytes Relative: 14 %
Neutro Abs: 2.3 10*3/uL (ref 1.7–7.7)
Neutrophils Relative %: 58 %
Platelets: 164 10*3/uL (ref 150–400)
RBC: 3.98 MIL/uL (ref 3.87–5.11)
RDW: 13.5 % (ref 11.5–15.5)
WBC: 4.1 10*3/uL (ref 4.0–10.5)
nRBC: 0 % (ref 0.0–0.2)

## 2020-05-29 LAB — BRAIN NATRIURETIC PEPTIDE: B Natriuretic Peptide: 69.1 pg/mL (ref 0.0–100.0)

## 2020-05-29 LAB — C-REACTIVE PROTEIN: CRP: 0.6 mg/dL (ref <1.0)

## 2020-05-29 LAB — MAGNESIUM: Magnesium: 2 mg/dL (ref 1.7–2.4)

## 2020-05-29 MED ORDER — CHLORHEXIDINE GLUCONATE CLOTH 2 % EX PADS
6.0000 | MEDICATED_PAD | Freq: Every day | CUTANEOUS | Status: DC
  Administered 2020-05-29 – 2020-05-30 (×2): 6 via TOPICAL

## 2020-05-29 MED ORDER — HYDROMORPHONE HCL 1 MG/ML IJ SOLN: 1.0000 mg | INTRAMUSCULAR | Status: DC | PRN

## 2020-05-29 MED ORDER — HYDROMORPHONE HCL 2 MG PO TABS
2.0000 mg | ORAL_TABLET | Freq: Three times a day (TID) | ORAL | Status: DC
  Administered 2020-05-29 – 2020-05-30 (×2): 2 mg via ORAL
  Filled 2020-05-29 (×2): qty 1

## 2020-05-29 MED ORDER — HYDROMORPHONE HCL 1 MG/ML IJ SOLN
1.0000 mg | Freq: Once | INTRAMUSCULAR | Status: AC
  Administered 2020-05-29: 1 mg via INTRAVENOUS
  Filled 2020-05-29: qty 1

## 2020-05-29 MED ORDER — HYDROMORPHONE HCL 1 MG/ML IJ SOLN
1.0000 mg | Freq: Four times a day (QID) | INTRAMUSCULAR | Status: DC | PRN
  Administered 2020-05-29: 19:00:00 1 mg via INTRAVENOUS
  Filled 2020-05-29: qty 1

## 2020-05-29 NOTE — Progress Notes (Signed)
PROGRESS NOTE    [name]  VXB:[id number] DOB: 02-Aug-1936 DOA: 05/27/2020 PCP: [name], NP   Brief Narrative:  [name] is a 84 y.o. female with medical history significant for pulmonary fibrosis with nocturnal hypoxia requiring 2 L, hypertension who presents with concerns of persistent weakness and diarrhea.  She states she was tested for Covid about a week ago and then found out 2 days later that she was positive.  Since then she has been been having persistent weakness, nausea without vomiting and feels like she has nonstop diarrhea.  Has been having decreased p.o. intake.  No fever.  Thinks she contracted Covid from a lady at church.  She reportedly has been vaccinated since March. Saw PCP earlier this week and received prednisone and doxycycline.  ED Course: She was afebrile normotensive on room air.  CBC showed no leukocytosis or leukopenia.  Hemoglobin and HCT appears hemoconcentrated at 16.4 and 47% respectively.  Mild hypokalemia of 3.2.  Creatinine normal at 0.79. C. difficile and GI panel negative.  Chest x-ray negative for acute finding.  Assessment & Plan:   Principal Problem:   Gastroenteritis due to COVID-19 virus Active Problems:   HTN (hypertension)   Postinflammatory pulmonary fibrosis (HCC)   COVID-19 virus infection   Gastroenteritis secondary to Covid virus infection 1L LR bolus given in ED PRN antiemetic PRN imodium for diarrhea- negative C.diff and GI panel. 9/26 Gi panel neg so far suspect diarrhea from viral GE. We will continue with ivf hydration. 9/27 Pt is stable and no Diarrhea or vomiting.   Pulmonary fibrosis stable. No worsening respiratory status. continue 2L qHS.  HTN Control and stable. pt does not have htn and is not on any treatment.    DVT prophylaxis:.Lovenox Code Status: Full Family Communication: Plan discussed with patient at bedside  disposition Plan: Home with observation Consults called:    Admission status: Observation  DVT prophylaxis: lovenox Family Communication:  Disposition Plan:   Status is: Observation  The patient remains OBS appropriate and will d/c before 2 midnights.  Dispo: The patient is from: Home              Anticipated d/c is to: Home              Anticipated d/c date is: 3 days              Patient currently is not medically stable to d/c.  Consultants:   None  Procedures: None  Antimicrobials:  Anti-infectives (From admission, onward)   None     Subjective: Pt seen for f/u she is alert but weak and will need aggressive PT for her weakness. Pt seen for f/u and she is having generalized weakness.  She states she feels better than yesterday. BP is wnl. Labs are stable.   Objective: Vitals:   05/28/20 2358 05/29/20 0511 05/29/20 0734 05/29/20 1154  BP: (!) 124/50 (!) 123/54 (!) 117/46 (!) 124/53  Pulse: (!) 56 (!) 52 (!) 50 (!) 54  Resp: 18 17 16 16   Temp: 98.3 F (36.8 C) 98.1 F (36.7 C) 98.3 F (36.8 C) 98.5 F (36.9 C)  TempSrc: Oral  Oral Oral  SpO2: 93% 99% 97% 96%  Weight:      Height:        Intake/Output Summary (Last 24 hours) at 05/29/2020 1635 Last data filed at 05/29/2020 0600 Gross per 24 hour  Intake --  Output 450 ml  Net -450 ml   Filed  Weights   05/27/20 1224  Weight: 57.8 kg   Examination: Blood pressure (!) 124/53, pulse (!) 54, temperature 98.5 F (36.9 C), temperature source Oral, resp. rate 16, height 5' (1.524 m), weight 57.8 kg, SpO2 96 %. General exam: Appears calm and comfortable  Respiratory system: Clear to auscultation. Respiratory effort normal. Cardiovascular system: S1 & S2 heard, RRR. No JVD, murmurs, rubs, gallops or clicks. No pedal edema. Gastrointestinal system: Abdomen is nondistended, soft and nontender. No organomegaly or masses felt. Normal bowel sounds heard. Central nervous system: Alert and oriented. No focal neurological deficits. Extremities: Pt moving all extremity and  hold herself up during exam. Skin: No rashes, lesions or ulcers Psychiatry: Judgement and insight appear normal. Mood & affect appropriate.  Data Reviewed: I have personally reviewed following labs and imaging studies  I/O last 3 completed shifts: In: -  Out: 450 [Urine:450] No intake/output data recorded. Lab Results  Component Value Date   CREATININE 0.80 05/29/2020   CREATININE 0.87 05/28/2020   CREATININE 0.79 05/27/2020   CBC: Recent Labs  Lab 05/27/20 1241 05/28/20 0432 05/29/20 0502  WBC 8.6 5.3 4.1  NEUTROABS  --  3.3 2.3  HGB 16.4* 14.9 12.4  HCT 47.6* 45.3 38.0  MCV 91.7 95.2 95.5  PLT 200 200 599   Basic Metabolic Panel: Recent Labs  Lab 05/27/20 1642 05/28/20 0432 05/29/20 0502  NA 139 142 140  K 3.2* 3.7 4.3  CL 106 109 105  CO2 20* 22 26  GLUCOSE 85 75 73  BUN 19 23 18   CREATININE 0.79 0.87 0.80  CALCIUM 8.9 8.6* 8.0*  MG  --  2.1 2.0  PHOS  --  3.3  --    GFR: Estimated Creatinine Clearance: 43.1 mL/min (by C-G formula based on SCr of 0.8 mg/dL). Liver Function Tests: Recent Labs  Lab 05/27/20 1642 05/28/20 0432 05/29/20 0502  AST 38 38 31  ALT 22 21 17   ALKPHOS 65 57 46  BILITOT 0.8 0.8 0.8  PROT 7.1 6.8 5.3*  ALBUMIN 3.5 3.3* 2.8*   No results for input(s): LIPASE, AMYLASE in the last 168 hours. No results for input(s): AMMONIA in the last 168 hours. Coagulation Profile: No results for input(s): INR, PROTIME in the last 168 hours. Cardiac Enzymes: No results for input(s): CKTOTAL, CKMB, CKMBINDEX, TROPONINI in the last 168 hours. BNP (last 3 results) No results for input(s): PROBNP in the last 8760 hours. HbA1C: No results for input(s): HGBA1C in the last 72 hours. CBG: No results for input(s): GLUCAP in the last 168 hours. Lipid Profile: No results for input(s): CHOL, HDL, LDLCALC, TRIG, CHOLHDL, LDLDIRECT in the last 72 hours. Thyroid Function Tests: No results for input(s): TSH, T4TOTAL, FREET4, T3FREE, THYROIDAB in the  last 72 hours. Anemia Panel: No results for input(s): VITAMINB12, FOLATE, FERRITIN, TIBC, IRON, RETICCTPCT in the last 72 hours. Sepsis Labs: No results for input(s): PROCALCITON, LATICACIDVEN in the last 168 hours.  Recent Results (from the past 240 hour(s))  Respiratory Panel by RT PCR (Flu A&B, Covid) - Nasopharyngeal Swab     Status: Abnormal   Collection Time: 05/27/20 12:41 PM   Specimen: Nasopharyngeal Swab  Result Value Ref Range Status   SARS Coronavirus 2 by RT PCR POSITIVE (A) NEGATIVE Final    Comment: RESULT CALLED TO, READ BACK BY AND VERIFIED WITH: Laure Kidney RN AT 3570 ON 05/27/20 SNG (NOTE) SARS-CoV-2 target nucleic acids are DETECTED.  SARS-CoV-2 RNA is generally detectable in upper respiratory specimens  during  the acute phase of infection. Positive results are indicative of the presence of the identified virus, but do not rule out bacterial infection or co-infection with other pathogens not detected by the test. Clinical correlation with patient history and other diagnostic information is necessary to determine patient infection status. The expected result is Negative.  Fact Sheet for Patients:  PinkCheek.be  Fact Sheet for Healthcare Providers: GravelBags.it  This test is not yet approved or cleared by the [place] FDA and  has been authorized for detection and/or diagnosis of SARS-CoV-2 by FDA under an Emergency Use Authorization (EUA).  This EUA will remain in effect (meaning this test can  be used) for the duration of  the COVID-19 declaration under Section 564(b)(1) of the Act, 21 U.S.C. section 360bbb-3(b)(1), unless the authorization is terminated or revoked sooner.      Influenza A by PCR NEGATIVE NEGATIVE Final   Influenza B by PCR NEGATIVE NEGATIVE Final    Comment: (NOTE) The Xpert Xpress SARS-CoV-2/FLU/RSV assay is intended as an aid in  the diagnosis of influenza from  Nasopharyngeal swab specimens and  should not be used as a sole basis for treatment. Nasal washings and  aspirates are unacceptable for Xpert Xpress SARS-CoV-2/FLU/RSV  testing.  Fact Sheet for Patients: PinkCheek.be  Fact Sheet for Healthcare Providers: GravelBags.it  This test is not yet approved or cleared by the [place] FDA and  has been authorized for detection and/or diagnosis of SARS-CoV-2 by  FDA under an Emergency Use Authorization (EUA). This EUA will remain  in effect (meaning this test can be used) for the duration of the  Covid-19 declaration under Section 564(b)(1) of the Act, 21  U.S.C. section 360bbb-3(b)(1), unless the authorization is  terminated or revoked. Performed at [facility], [place], [place] [address]   C Difficile Quick Screen w PCR reflex     Status: None   Collection Time: 05/27/20  1:53 PM   Specimen: STOOL  Result Value Ref Range Status   C Diff antigen NEGATIVE NEGATIVE Final   C Diff toxin NEGATIVE NEGATIVE Final   C Diff interpretation No C. difficile detected.  Final    Comment: Performed at [facility], [address] [place], [place] [address]  Gastrointestinal Panel by PCR , Stool     Status: None   Collection Time: 05/27/20  1:53 PM   Specimen: STOOL  Result Value Ref Range Status   Campylobacter species NOT DETECTED NOT DETECTED Final   Plesimonas shigelloides NOT DETECTED NOT DETECTED Final   Salmonella species NOT DETECTED NOT DETECTED Final   Yersinia enterocolitica NOT DETECTED NOT DETECTED Final   Vibrio species NOT DETECTED NOT DETECTED Final   Vibrio cholerae NOT DETECTED NOT DETECTED Final   Enteroaggregative E coli (EAEC) NOT DETECTED NOT DETECTED Final   Enteropathogenic E coli (EPEC) NOT DETECTED NOT DETECTED Final   Enterotoxigenic E coli (ETEC) NOT DETECTED NOT DETECTED Final   Shiga like toxin producing E coli (STEC) NOT  DETECTED NOT DETECTED Final   Shigella/Enteroinvasive E coli (EIEC) NOT DETECTED NOT DETECTED Final   Cryptosporidium NOT DETECTED NOT DETECTED Final   Cyclospora cayetanensis NOT DETECTED NOT DETECTED Final   Entamoeba histolytica NOT DETECTED NOT DETECTED Final   Giardia lamblia NOT DETECTED NOT DETECTED Final   Adenovirus F40/41 NOT DETECTED NOT DETECTED Final   Astrovirus NOT DETECTED NOT DETECTED Final   Norovirus GI/GII NOT DETECTED NOT DETECTED Final   Rotavirus A NOT DETECTED NOT  DETECTED Final   Sapovirus (I, II, IV, and V) NOT DETECTED NOT DETECTED Final    Comment: Performed at [facility], [address] [place], [place] [address]    Radiology Studies: ECHOCARDIOGRAM COMPLETE  Result Date: 05/29/2020    ECHOCARDIOGRAM REPORT   Patient Name:   [name] Date of Exam: 05/29/2020 Medical Rec #:  [id number]         Height:       60.0 in Accession #:    [id number]        Weight:       127.4 lb Date of Birth:  Dec 17, 1935         BSA:          1.541 m Patient Age:    70 years          BP:           117/46 mmHg Patient Gender: F                 HR:           50 bpm. Exam Location:  [facility] Procedure: 2D Echo, Cardiac Doppler and Color Doppler Indications:     Bradycardia  History:         Patient has no prior history of Echocardiogram examinations.                  Signs/Symptoms:Shortness of Breath; Risk Factors:Hypertension.                  Pneumonia.  Sonographer:     Sherrie Sport RDCS (AE) Referring Phys:  [place] Diagnosing Phys: Serafina Royals MD  Sonographer Comments: No parasternal window, no subcostal window and Technically challenging study due to limited acoustic windows. IMPRESSIONS  1. Left ventricular ejection fraction, by estimation, is 60 to 65%. The left ventricle has normal function. The left ventricle has no regional wall motion abnormalities. Left ventricular diastolic parameters were normal.  2. Right ventricular systolic function is normal. The  right ventricular size is normal.  3. Left atrial size was mildly dilated.  4. The mitral valve is normal in structure. Mild mitral valve regurgitation.  5. The aortic valve is normal in structure. Aortic valve regurgitation is not visualized. FINDINGS  Left Ventricle: Left ventricular ejection fraction, by estimation, is 60 to 65%. The left ventricle has normal function. The left ventricle has no regional wall motion abnormalities. The left ventricular internal cavity size was normal in size. There is  no left ventricular hypertrophy. Left ventricular diastolic parameters were normal. Right Ventricle: The right ventricular size is normal. No increase in right ventricular wall thickness. Right ventricular systolic function is normal. Left Atrium: Left atrial size was mildly dilated. Right Atrium: Right atrial size was normal in size. Pericardium: There is no evidence of pericardial effusion. Mitral Valve: The mitral valve is normal in structure. Mild mitral valve regurgitation. Tricuspid Valve: The tricuspid valve is normal in structure. Tricuspid valve regurgitation is trivial. Aortic Valve: The aortic valve is normal in structure. Aortic valve regurgitation is not visualized. Aortic valve mean gradient measures 5.0 mmHg. Aortic valve peak gradient measures 10.6 mmHg. Aortic valve area, by VTI measures 1.77 cm. Pulmonic Valve: The pulmonic valve was normal in structure. Pulmonic valve regurgitation is not visualized. Aorta: The aortic root and ascending aorta are structurally normal, with no evidence of dilitation. IAS/Shunts: No atrial level shunt detected by color flow Doppler.  LEFT VENTRICLE PLAX 2D LVIDd:  2.99 cm  Diastology LVIDs:         2.16 cm  LV e' medial:    6.09 cm/s LV PW:         1.17 cm  LV E/e' medial:  11.9 LV IVS:        1.49 cm  LV e' lateral:   9.25 cm/s LVOT diam:     2.00 cm  LV E/e' lateral: 7.8 LV SV:         60 LV SV Index:   39 LVOT Area:     3.14 cm  RIGHT VENTRICLE RV S prime:      19.60 cm/s TAPSE (M-mode): 3.0 cm LEFT ATRIUM             Index       RIGHT ATRIUM           Index LA diam:        3.70 cm 2.40 cm/m  RA Area:     10.60 cm LA Vol (A2C):   92.0 ml 59.70 ml/m RA Volume:   19.60 ml  12.72 ml/m LA Vol (A4C):   65.5 ml 42.50 ml/m LA Biplane Vol: 79.3 ml 51.45 ml/m  AORTIC VALVE AV Area (Vmax):    1.89 cm AV Area (Vmean):   1.67 cm AV Area (VTI):     1.77 cm AV Vmax:           162.50 cm/s AV Vmean:          106.600 cm/s AV VTI:            0.337 m AV Peak Grad:      10.6 mmHg AV Mean Grad:      5.0 mmHg LVOT Vmax:         97.90 cm/s LVOT Vmean:        56.800 cm/s LVOT VTI:          0.190 m LVOT/AV VTI ratio: 0.56  AORTA Ao Root diam: 2.40 cm MITRAL VALVE               TRICUSPID VALVE MV Area (PHT): 3.27 cm    TR Peak grad:   13.1 mmHg MV Decel Time: 232 msec    TR Vmax:        181.00 cm/s MV E velocity: 72.40 cm/s MV A velocity: 62.60 cm/s  SHUNTS MV E/A ratio:  1.16        Systemic VTI:  0.19 m                            Systemic Diam: 2.00 cm Serafina Royals MD Electronically signed by Serafina Royals MD Signature Date/Time: 05/29/2020/1:05:17 PM    Final    Scheduled Meds . Chlorhexidine Gluconate Cloth  6 each Topical Daily  . cholecalciferol  1,000 Units Oral Daily  . citalopram  40 mg Oral Daily  . enoxaparin (LOVENOX) injection  40 mg Subcutaneous Q24H  . HYDROmorphone  4 mg Oral TID  . pantoprazole (PROTONIX) IV  40 mg Intravenous Daily  . polycarbophil  625 mg Oral Daily  . sulfaSALAzine  500 mg Oral QID  . vitamin B-12  1,000 mcg Oral Daily   Continuous Infusions:   LOS: 1 day    Para Skeans, MD [facility] Pager [phone] If 7PM-7AM, please contact night-coverage [email or web address] Password [facility] 05/29/2020, 4:35 PM

## 2020-05-29 NOTE — Consult Note (Signed)
[facility] Consultation Note  Patient ID: [name], MRN: [id number], DOB/AGE: 01-10-1936 84 y.o. Admit date: 05/27/2020   Date of Consult: 05/29/2020 Primary Physician: [name], NP Primary Cardiologist: None  Chief Complaint:  Chief Complaint  Patient presents with  . Weakness   Reason for Consult: Tachycardia  HPI: 84 y.o. female with known pulmonary fibrosis and other lung issues who has had new onset of significant Covid infection and side effects from that.  The patient has had appropriate treatment for this with no evidence of chest discomfort or congestive heart failure type symptoms but has had an EKG showing normal sinus rhythm with left axis deviation and occasional preventricular contractions with short PR interval.  The patient him has subsequently had an echocardiogram showing normal LV systolic function with no evidence of significant valvular heart disease and ejection fraction of 60%.  By telemetry the patient typically has sinus bradycardia with occasional preventricular contractions but there was 1 run of abnormal electrical changes by telemetry most consistent with artifact at 300 bpm which appears to be noise with no apparent symptoms.  This is not reoccurred and is not consistent with a true rhythm concern.  Past Medical History:  Diagnosis Date  . Altered mental state   . Anxiety   . BMI 30.0-30.9,adult   . Cellulitis   . Chest pain, atypical   . Compression fracture of L4 lumbar vertebra   . Constipation   . Cystitis   . Cystocele   . Diverticulosis   . Fatigue   . Fibrosis, idiopathic pulmonary ([place])   . Gastroenteritis   . History of back surgery   . History of herniated intervertebral disc   . HTN (hypertension)   . Hypercholesteremia   . Hypocalcemia   . Incontinence of urine   . OA (osteoarthritis)    shoulder and back  . Obesity   . Osteopenia   . Pneumonia   . Shortness of breath   . Sleep apnea   . Thrush    . Vitamin D deficiency       Surgical History:  Past Surgical History:  Procedure Laterality Date  . BACK SURGERY    . CATARACT EXTRACTION W/PHACO Left 04/24/2016   Procedure: CATARACT EXTRACTION PHACO AND INTRAOCULAR LENS PLACEMENT (IOC);  Surgeon: Leandrew Koyanagi, MD;  Location: [facility];  Service: Ophthalmology;  Laterality: Left;  . CATARACT EXTRACTION W/PHACO Right 07/14/2017   Procedure: CATARACT EXTRACTION PHACO AND INTRAOCULAR LENS PLACEMENT ([place])  RIGHT;  Surgeon: Leandrew Koyanagi, MD;  Location: [facility];  Service: Ophthalmology;  Laterality: Right;  . CHOLECYSTECTOMY    . ESOPHAGOGASTRODUODENOSCOPY (EGD) WITH PROPOFOL N/A 10/13/2018   Procedure: ESOPHAGOGASTRODUODENOSCOPY (EGD) WITH PROPOFOL;  Surgeon: [name], MD;  Location: [facility] ENDOSCOPY;  Service: Gastroenterology;  Laterality: N/A;  . FLEXIBLE SIGMOIDOSCOPY N/A 10/13/2018   Procedure: FLEXIBLE SIGMOIDOSCOPY;  Surgeon: [name], MD;  Location: [facility] ENDOSCOPY;  Service: Gastroenterology;  Laterality: N/A;  . FLEXIBLE SIGMOIDOSCOPY N/A 09/09/2019   Procedure: FLEXIBLE SIGMOIDOSCOPY;  Surgeon: [name], MD;  Location: [facility] ENDOSCOPY;  Service: Gastroenterology;  Laterality: N/A;  . ROTATOR CUFF REPAIR Bilateral    left-12/2009; right-03/2009 dr.califf  . TONSILLECTOMY    . TOTAL SHOULDER REPLACEMENT Left      Home Meds: Prior to Admission medications   Medication Sig Start Date End Date Taking? Authorizing Provider  Calcium Polycarbophil 625 MG CHEW Chew 1 tablet by mouth daily.   Yes [provider]  Cholecalciferol 25 MCG (  1000 UT) capsule Take 1,000 Units by mouth daily.   Yes [provider]  citalopram (CELEXA) 40 MG tablet Take 1 tablet (40 mg total) by mouth daily. For anxiety. 03/15/20  Yes [name], NP  denosumab (PROLIA) 60 MG/ML SOSY injection Inject 60 mg into the skin every 6 (six) months.   Yes [provider]  doxycycline (VIBRA-TABS) 100  MG tablet Take 100 mg by mouth 2 (two) times daily. 05/26/20  Yes [provider]  ESBRIET 267 MG TABS Take 6 tablets by mouth daily.  02/25/17  Yes [provider]  HYDROmorphone (DILAUDID) 4 MG tablet Take 4 mg by mouth 3 (three) times daily.  04/23/19  Yes [provider]  methylPREDNISolone (MEDROL DOSEPAK) 4 MG TBPK tablet Take by mouth. 05/26/20  Yes [provider]  sulfaSALAzine (AZULFIDINE) 500 MG tablet Take 500 mg by mouth 4 (four) times daily.   Yes [provider]  vitamin B-12 (CYANOCOBALAMIN) 1000 MCG tablet Take 1,000 mcg by mouth daily.    Yes [provider]  VOLTAREN 1 % GEL  12/23/17  Yes [provider]    Inpatient Medications:  . Chlorhexidine Gluconate Cloth  6 each Topical Daily  . cholecalciferol  1,000 Units Oral Daily  . citalopram  40 mg Oral Daily  . enoxaparin (LOVENOX) injection  40 mg Subcutaneous Q24H  . HYDROmorphone  4 mg Oral TID  . pantoprazole (PROTONIX) IV  40 mg Intravenous Daily  . polycarbophil  625 mg Oral Daily  . sulfaSALAzine  500 mg Oral QID  . vitamin B-12  1,000 mcg Oral Daily     Allergies: No Known Allergies  Social History   Socioeconomic History  . Marital status: Married    Spouse name: [name]  . Number of children: 2  . Years of education: Not on file  . Highest education level: Not on file  Occupational History  . Not on file  Tobacco Use  . Smoking status: Never Smoker  . Smokeless tobacco: Never Used  Vaping Use  . Vaping Use: Never used  Substance and Sexual Activity  . Alcohol use: No  . Drug use: No  . Sexual activity: Never    Birth control/protection: None  Other Topics Concern  . Not on file  Social History Narrative  . Not on file   Social Determinants of Health   Financial Resource Strain:   . Difficulty of Paying Living Expenses: Not on file  Food Insecurity:   . Worried About Charity fundraiser in the Last Year: Not on file  . Ran Out  of Food in the Last Year: Not on file  Transportation Needs:   . Lack of Transportation (Medical): Not on file  . Lack of Transportation (Non-Medical): Not on file  Physical Activity:   . Days of Exercise per Week: Not on file  . Minutes of Exercise per Session: Not on file  Stress:   . Feeling of Stress : Not on file  Social Connections:   . Frequency of Communication with Friends and Family: Not on file  . Frequency of Social Gatherings with Friends and Family: Not on file  . Attends Religious Services: Not on file  . Active Member of Clubs or Organizations: Not on file  . Attends Archivist Meetings: Not on file  . Marital Status: Not on file  Intimate Partner Violence:   . Fear of Current or Ex-Partner: Not on file  . Emotionally Abused: Not  on file  . Physically Abused: Not on file  . Sexually Abused: Not on file     Family History  Problem Relation Age of Onset  . COPD Mother   . Cancer Brother        colon     Review of Systems  Labs: No results for input(s): CKTOTAL, CKMB, TROPONINI in the last 72 hours. Lab Results  Component Value Date   WBC 4.1 05/29/2020   HGB 12.4 05/29/2020   HCT 38.0 05/29/2020   MCV 95.5 05/29/2020   PLT 164 05/29/2020    Recent Labs  Lab 05/29/20 0502  NA 140  K 4.3  CL 105  CO2 26  BUN 18  CREATININE 0.80  CALCIUM 8.0*  PROT 5.3*  BILITOT 0.8  ALKPHOS 46  ALT 17  AST 31  GLUCOSE 73   Lab Results  Component Value Date   CHOL 195 09/13/2019   HDL 77.90 09/13/2019   LDLCALC 99 09/13/2019   TRIG 94.0 09/13/2019   No results found for: DDIMER  Radiology/Studies:  DG Chest Port 1 View  Result Date: 05/27/2020 CLINICAL DATA:  84 year old female with weakness. EXAM: PORTABLE CHEST 1 VIEW COMPARISON:  10/07/2018 FINDINGS: The heart size and mediastinal contours are unchanged. Low lung volumes. Unchanged elevation left hemidiaphragm. Slightly increased prominence of the pulmonary vasculature and increased  streaky and reticular opacities in the bilateral lung bases, left greater than right. No pleural effusion or pneumothorax. Status post left reverse shoulder arthroplasty. Similar appearing changes after multilevel lumbar vertebral body cement augmentation. IMPRESSION: Similar appearing changes associated with known interstitial lung disease. No acute cardiopulmonary process. Electronically Signed   By: Ruthann Cancer MD   On: 05/27/2020 13:53   ECHOCARDIOGRAM COMPLETE  Result Date: 05/29/2020    ECHOCARDIOGRAM REPORT   Patient Name:   [name] Date of Exam: 05/29/2020 Medical Rec #:  [id number]         Height:       60.0 in Accession #:    [id number]        Weight:       127.4 lb Date of Birth:  1935-11-29         BSA:          1.541 m Patient Age:    84 years          BP:           117/46 mmHg Patient Gender: F                 HR:           50 bpm. Exam Location:  [facility] Procedure: 2D Echo, Cardiac Doppler and Color Doppler Indications:     Bradycardia  History:         Patient has no prior history of Echocardiogram examinations.                  Signs/Symptoms:Shortness of Breath; Risk Factors:Hypertension.                  Pneumonia.  Sonographer:     Sherrie Sport RDCS (AE) Referring Phys:  [place] Diagnosing Phys: Serafina Royals MD  Sonographer Comments: No parasternal window, no subcostal window and Technically challenging study due to limited acoustic windows. IMPRESSIONS  1. Left ventricular ejection fraction, by estimation, is 60 to 65%. The left ventricle has normal function. The left ventricle has no regional wall motion abnormalities. Left ventricular diastolic parameters were  normal.  2. Right ventricular systolic function is normal. The right ventricular size is normal.  3. Left atrial size was mildly dilated.  4. The mitral valve is normal in structure. Mild mitral valve regurgitation.  5. The aortic valve is normal in structure. Aortic valve regurgitation is not visualized. FINDINGS   Left Ventricle: Left ventricular ejection fraction, by estimation, is 60 to 65%. The left ventricle has normal function. The left ventricle has no regional wall motion abnormalities. The left ventricular internal cavity size was normal in size. There is  no left ventricular hypertrophy. Left ventricular diastolic parameters were normal. Right Ventricle: The right ventricular size is normal. No increase in right ventricular wall thickness. Right ventricular systolic function is normal. Left Atrium: Left atrial size was mildly dilated. Right Atrium: Right atrial size was normal in size. Pericardium: There is no evidence of pericardial effusion. Mitral Valve: The mitral valve is normal in structure. Mild mitral valve regurgitation. Tricuspid Valve: The tricuspid valve is normal in structure. Tricuspid valve regurgitation is trivial. Aortic Valve: The aortic valve is normal in structure. Aortic valve regurgitation is not visualized. Aortic valve mean gradient measures 5.0 mmHg. Aortic valve peak gradient measures 10.6 mmHg. Aortic valve area, by VTI measures 1.77 cm. Pulmonic Valve: The pulmonic valve was normal in structure. Pulmonic valve regurgitation is not visualized. Aorta: The aortic root and ascending aorta are structurally normal, with no evidence of dilitation. IAS/Shunts: No atrial level shunt detected by color flow Doppler.  LEFT VENTRICLE PLAX 2D LVIDd:         2.99 cm  Diastology LVIDs:         2.16 cm  LV e' medial:    6.09 cm/s LV PW:         1.17 cm  LV E/e' medial:  11.9 LV IVS:        1.49 cm  LV e' lateral:   9.25 cm/s LVOT diam:     2.00 cm  LV E/e' lateral: 7.8 LV SV:         60 LV SV Index:   39 LVOT Area:     3.14 cm  RIGHT VENTRICLE RV S prime:     19.60 cm/s TAPSE (M-mode): 3.0 cm LEFT ATRIUM             Index       RIGHT ATRIUM           Index LA diam:        3.70 cm 2.40 cm/m  RA Area:     10.60 cm LA Vol (A2C):   92.0 ml 59.70 ml/m RA Volume:   19.60 ml  12.72 ml/m LA Vol (A4C):   65.5  ml 42.50 ml/m LA Biplane Vol: 79.3 ml 51.45 ml/m  AORTIC VALVE AV Area (Vmax):    1.89 cm AV Area (Vmean):   1.67 cm AV Area (VTI):     1.77 cm AV Vmax:           162.50 cm/s AV Vmean:          106.600 cm/s AV VTI:            0.337 m AV Peak Grad:      10.6 mmHg AV Mean Grad:      5.0 mmHg LVOT Vmax:         97.90 cm/s LVOT Vmean:        56.800 cm/s LVOT VTI:          0.190 m LVOT/AV  VTI ratio: 0.56  AORTA Ao Root diam: 2.40 cm MITRAL VALVE               TRICUSPID VALVE MV Area (PHT): 3.27 cm    TR Peak grad:   13.1 mmHg MV Decel Time: 232 msec    TR Vmax:        181.00 cm/s MV E velocity: 72.40 cm/s MV A velocity: 62.60 cm/s  SHUNTS MV E/A ratio:  1.16        Systemic VTI:  0.19 m                            Systemic Diam: 2.00 cm Serafina Royals MD Electronically signed by Serafina Royals MD Signature Date/Time: 05/29/2020/1:05:17 PM    Final     EKG: Normal sinus rhythm with short PR interval with left axis deviation occasional preventricular contraction  Weights: Filed Weights   05/27/20 1224  Weight: 57.8 kg     Physical Exam: Blood pressure (!) 124/53, pulse (!) 54, temperature 98.5 F (36.9 C), temperature source Oral, resp. rate 16, height 5' (1.524 m), weight 57.8 kg, SpO2 96 %. Body mass index is 24.89 kg/m.  As per prime doc    Assessment: 84 year old female with pulmonary fibrosis and now with Covid infection but no current evidence of myocardial infarction congestive heart failure or other cardiac symptoms with telemetry showing what appears to be artifact at 300 bpm isolated without recurrence and no evidence of symptoms  Plan: 1.  No further cardiac intervention at this time due to no evidence of cardiovascular symptoms heart failure angina myocardial infarction with a normal LV function by echocardiogram and no other rhythm disturbances seen.  Would continue telemetry for now with observation for other rhythm disturbances needing further potential  intervention  Signed, Corey Skains M.D. [facility] 05/29/2020, 1:36 PM

## 2020-05-29 NOTE — Progress Notes (Signed)
*  PRELIMINARY RESULTS* Echocardiogram 2D Echocardiogram has been performed.  [name] 05/29/2020, 10:31 AM

## 2020-05-30 DIAGNOSIS — A0839 Other viral enteritis: Secondary | ICD-10-CM | POA: Diagnosis not present

## 2020-05-30 DIAGNOSIS — U071 COVID-19: Secondary | ICD-10-CM | POA: Diagnosis not present

## 2020-05-30 LAB — COMPREHENSIVE METABOLIC PANEL
ALT: 17 U/L (ref 0–44)
AST: 32 U/L (ref 15–41)
Albumin: 3.1 g/dL — ABNORMAL LOW (ref 3.5–5.0)
Alkaline Phosphatase: 48 U/L (ref 38–126)
Anion gap: 11 (ref 5–15)
BUN: 10 mg/dL (ref 8–23)
CO2: 26 mmol/L (ref 22–32)
Calcium: 8.2 mg/dL — ABNORMAL LOW (ref 8.9–10.3)
Chloride: 103 mmol/L (ref 98–111)
Creatinine, Ser: 0.76 mg/dL (ref 0.44–1.00)
GFR calc Af Amer: >60 mL/min (ref >60)
GFR calc non Af Amer: >60 mL/min (ref >60)
Glucose, Bld: 87 mg/dL (ref 70–99)
Potassium: 4.2 mmol/L (ref 3.5–5.1)
Sodium: 140 mmol/L (ref 135–145)
Total Bilirubin: 0.9 mg/dL (ref 0.3–1.2)
Total Protein: 5.5 g/dL — ABNORMAL LOW (ref 6.5–8.1)

## 2020-05-30 LAB — CBC WITH DIFFERENTIAL/PLATELET
Abs Immature Granulocytes: 0.01 10*3/uL (ref 0.00–0.07)
Basophils Absolute: 0 10*3/uL (ref 0.0–0.1)
Basophils Relative: 0 %
Eosinophils Absolute: 0 10*3/uL (ref 0.0–0.5)
Eosinophils Relative: 0 %
HCT: 38.3 % (ref 36.0–46.0)
Hemoglobin: 13.1 g/dL (ref 12.0–15.0)
Immature Granulocytes: 0 %
Lymphocytes Relative: 23 %
Lymphs Abs: 1.3 10*3/uL (ref 0.7–4.0)
MCH: 31.5 pg (ref 26.0–34.0)
MCHC: 34.2 g/dL (ref 30.0–36.0)
MCV: 92.1 fL (ref 80.0–100.0)
Monocytes Absolute: 0.6 10*3/uL (ref 0.1–1.0)
Monocytes Relative: 10 %
Neutro Abs: 3.8 10*3/uL (ref 1.7–7.7)
Neutrophils Relative %: 67 %
Platelets: 145 10*3/uL — ABNORMAL LOW (ref 150–400)
RBC: 4.16 MIL/uL (ref 3.87–5.11)
RDW: 13.2 % (ref 11.5–15.5)
WBC: 5.8 10*3/uL (ref 4.0–10.5)
nRBC: 0 % (ref 0.0–0.2)

## 2020-05-30 LAB — URINE CULTURE: Culture: NO GROWTH

## 2020-05-30 LAB — C-REACTIVE PROTEIN: CRP: 1.2 mg/dL — ABNORMAL HIGH (ref <1.0)

## 2020-05-30 MED ORDER — SODIUM CHLORIDE 0.9 % IV SOLN
100.0000 mg | Freq: Every day | INTRAVENOUS | Status: AC
  Administered 2020-05-31 – 2020-06-03 (×4): 100 mg via INTRAVENOUS
  Filled 2020-05-30 (×4): qty 20

## 2020-05-30 MED ORDER — HYDROMORPHONE HCL 1 MG/ML IJ SOLN: 1.0000 mg | Freq: Three times a day (TID) | INTRAMUSCULAR | Status: DC | PRN

## 2020-05-30 MED ORDER — HYDROMORPHONE HCL 2 MG PO TABS
1.0000 mg | ORAL_TABLET | Freq: Three times a day (TID) | ORAL | Status: DC
  Administered 2020-05-30 – 2020-06-03 (×11): 1 mg via ORAL
  Filled 2020-05-30 (×11): qty 1

## 2020-05-30 MED ORDER — GABAPENTIN 100 MG PO CAPS
100.0000 mg | ORAL_CAPSULE | Freq: Three times a day (TID) | ORAL | Status: DC
  Administered 2020-05-30 – 2020-06-03 (×13): 100 mg via ORAL
  Filled 2020-05-30 (×13): qty 1

## 2020-05-30 MED ORDER — SODIUM CHLORIDE 0.9 % IV SOLN
200.0000 mg | Freq: Once | INTRAVENOUS | Status: AC
  Administered 2020-05-30: 13:00:00 200 mg via INTRAVENOUS
  Filled 2020-05-30: qty 200

## 2020-05-30 MED ORDER — LIDOCAINE 5 % EX PTCH
1.0000 | MEDICATED_PATCH | CUTANEOUS | Status: DC
  Administered 2020-05-30 – 2020-06-03 (×5): 1 via TRANSDERMAL
  Filled 2020-05-30 (×5): qty 1

## 2020-05-30 NOTE — Progress Notes (Addendum)
PROGRESS NOTE    [name]  FUX:[id number] DOB: 04/07/1936 DOA: 05/27/2020 PCP: [name], NP   Brief Narrative:  [name] is a 84 y.o. female with medical history significant for pulmonary fibrosis with nocturnal hypoxia requiring 2 L, hypertension who presents with concerns of persistent weakness and diarrhea.  She states she was tested for Covid about a week ago and then found out 2 days later that she was positive.  Since then she has been been having persistent weakness, nausea without vomiting and feels like she has nonstop diarrhea.  Has been having decreased p.o. intake.  No fever.  Thinks she contracted Covid from a lady at church.  She reportedly has been vaccinated since March. Saw PCP earlier this week and received prednisone and doxycycline.  ED Course: She was afebrile normotensive on room air.  CBC showed no leukocytosis or leukopenia.  Hemoglobin and HCT appears hemoconcentrated at 16.4 and 47% respectively.  Mild hypokalemia of 3.2.  Creatinine normal at 0.79. C. difficile and GI panel negative.  Chest x-ray negative for acute finding.  Assessment & Plan:   Principal Problem:   Gastroenteritis due to COVID-19 virus Active Problems:   HTN (hypertension)   Postinflammatory pulmonary fibrosis (HCC)   COVID-19 virus infection   Gastroenteritis secondary to Covid virus infection 1L LR bolus given in ED PRN antiemetic PRN imodium for diarrhea- negative C.diff and GI panel. 9/26 Gi panel neg so far suspect diarrhea from viral GE. We will continue with ivf hydration. 9/27 Pt is stable and no Diarrhea or vomiting.   Pulmonary fibrosis stable. No worsening respiratory status. continue 2L qHS.  HTN Control and stable. pt does not have htn and is not on any treatment.   Covid-19 Pt is stable and alert,awake and oriented and not oxygen requiring Pt has GI  Symptoms of Covid-19. Pt states she would like the new infusion for covid. D/w pt  that I will start her on it today.   Low back pain: Pt has been on 4 mg dilaudid 4 mg tid for som an years and now is sable to see her.  Lidocaine patch and Neurontin started with wean for dilaudid to 1.  DVT prophylaxis:.Lovenox Code Status: Full Family Communication: Plan discussed with patient at bedside  disposition Plan: Home with observation Consults called:  Admission status: Observation  DVT prophylaxis: lovenox Family Communication:  Disposition Plan:   Status is: Observation  The patient remains OBS appropriate and will d/c before 2 midnights.  Dispo: The patient is from: Home              Anticipated d/c is to: Home              Anticipated d/c date is: 3 days              Patient currently is not medically stable to d/c.  Consultants:   None  Procedures: None  Antimicrobials:  Anti-infectives (From admission, onward)   Start     Dose/Rate Route Frequency Ordered Stop   05/31/20 1000  remdesivir 100 mg in sodium chloride 0.9 % 100 mL IVPB       "Followed by" Linked Group Details   100 mg 200 mL/hr over 30 Minutes Intravenous Daily 05/30/20 1057 06/04/20 0959   05/30/20 1200  remdesivir 200 mg in sodium chloride 0.9% 250 mL IVPB       "Followed by" Linked Group Details   200 mg 580 mL/hr over 30 Minutes Intravenous  Once 05/30/20 1057       Subjective: Pt seen for f/u she is alert but weak and will need aggressive PT for her weakness. Pt seen for f/u and she is having generalized weakness.  She states she feels better than yesterday. BP is wnl. Labs are stable.  Pt has been on very high pain regimen with dilaudid for her back and totaling 16 mg daily and is not helping her pain she [facility]. I have d/w her that I will wean her off and give her iv pain meds while giving her Neurontin for pain.  Pt also states she wants the new infusion for covid  And I assured her I will start her on it today.   Objective: Vitals:   05/29/20  1936 05/30/20 0046 05/30/20 0317 05/30/20 0805  BP: (!) 108/54 (!) 103/59 124/65 (!) 114/36  Pulse: 65 63 (!) 54 68  Resp: 16 18 18 18   Temp: 99.5 F (37.5 C) 98.7 F (37.1 C) 99.7 F (37.6 C) 98.1 F (36.7 C)  TempSrc: Oral Oral Oral   SpO2: 94% 99% 93% 91%  Weight:      Height:        Intake/Output Summary (Last 24 hours) at 05/30/2020 1155 Last data filed at 05/30/2020 0600 Gross per 24 hour  Intake --  Output 550 ml  Net -550 ml   Filed Weights   05/27/20 1224  Weight: 57.8 kg   Examination: Blood pressure (!) 114/36, pulse 68, temperature 98.1 F (36.7 C), resp. rate 18, height 5' (1.524 m), weight 57.8 kg, SpO2 91 %. General exam: Appears calm and comfortable  Respiratory system: Clear to auscultation. Respiratory effort normal. Cardiovascular system: S1 & S2 heard, RRR. No JVD, murmurs, rubs, gallops or clicks. No pedal edema. Gastrointestinal system: Abdomen is nondistended, soft and nontender. No organomegaly or masses felt. Normal bowel sounds heard. Central nervous system: Alert and oriented. No focal neurological deficits. Extremities: Pt moving all extremity and hold herself up during exam. Skin: No rashes, lesions or ulcers Psychiatry: Judgement and insight appear normal. Mood & affect appropriate.  Data Reviewed: I have personally reviewed following labs and imaging studies  I/O last 3 completed shifts: In: -  Out: 1000 [Urine:1000] No intake/output data recorded. Lab Results  Component Value Date   CREATININE 0.76 05/30/2020   CREATININE 0.80 05/29/2020   CREATININE 0.87 05/28/2020   CBC: Recent Labs  Lab 05/27/20 1241 05/28/20 0432 05/29/20 0502 05/30/20 0553  WBC 8.6 5.3 4.1 5.8  NEUTROABS  --  3.3 2.3 3.8  HGB 16.4* 14.9 12.4 13.1  HCT 47.6* 45.3 38.0 38.3  MCV 91.7 95.2 95.5 92.1  PLT 200 200 164 742*   Basic Metabolic Panel: Recent Labs  Lab 05/27/20 1642 05/28/20 0432 05/29/20 0502 05/30/20 0553  NA 139 142 140 140  K 3.2* 3.7  4.3 4.2  CL 106 109 105 103  CO2 20* 22 26 26   GLUCOSE 85 75 73 87  BUN 19 23 18 10   CREATININE 0.79 0.87 0.80 0.76  CALCIUM 8.9 8.6* 8.0* 8.2*  MG  --  2.1 2.0  --   PHOS  --  3.3  --   --    GFR: Estimated Creatinine Clearance: 43.1 mL/min (by C-G formula based on SCr of 0.76 mg/dL). Liver Function Tests: Recent Labs  Lab 05/27/20 1642 05/28/20 0432 05/29/20 0502 05/30/20 0553  AST 38 38 31 32  ALT 22 21 17 17   ALKPHOS  65 57 46 48  BILITOT 0.8 0.8 0.8 0.9  PROT 7.1 6.8 5.3* 5.5*  ALBUMIN 3.5 3.3* 2.8* 3.1*   No results for input(s): LIPASE, AMYLASE in the last 168 hours. No results for input(s): AMMONIA in the last 168 hours. Coagulation Profile: No results for input(s): INR, PROTIME in the last 168 hours. Cardiac Enzymes: No results for input(s): CKTOTAL, CKMB, CKMBINDEX, TROPONINI in the last 168 hours. BNP (last 3 results) No results for input(s): PROBNP in the last 8760 hours. HbA1C: No results for input(s): HGBA1C in the last 72 hours. CBG: No results for input(s): GLUCAP in the last 168 hours. Lipid Profile: No results for input(s): CHOL, HDL, LDLCALC, TRIG, CHOLHDL, LDLDIRECT in the last 72 hours. Thyroid Function Tests: No results for input(s): TSH, T4TOTAL, FREET4, T3FREE, THYROIDAB in the last 72 hours. Anemia Panel: No results for input(s): VITAMINB12, FOLATE, FERRITIN, TIBC, IRON, RETICCTPCT in the last 72 hours. Sepsis Labs: No results for input(s): PROCALCITON, LATICACIDVEN in the last 168 hours.  Recent Results (from the past 240 hour(s))  Respiratory Panel by RT PCR (Flu A&B, Covid) - Nasopharyngeal Swab     Status: Abnormal   Collection Time: 05/27/20 12:41 PM   Specimen: Nasopharyngeal Swab  Result Value Ref Range Status   SARS Coronavirus 2 by RT PCR POSITIVE (A) NEGATIVE Final    Comment: RESULT CALLED TO, READ BACK BY AND VERIFIED WITH: Laure Kidney RN AT 2229 ON 05/27/20 SNG (NOTE) SARS-CoV-2 target nucleic acids are  DETECTED.  SARS-CoV-2 RNA is generally detectable in upper respiratory specimens  during the acute phase of infection. Positive results are indicative of the presence of the identified virus, but do not rule out bacterial infection or co-infection with other pathogens not detected by the test. Clinical correlation with patient history and other diagnostic information is necessary to determine patient infection status. The expected result is Negative.  Fact Sheet for Patients:  PinkCheek.be  Fact Sheet for Healthcare Providers: GravelBags.it  This test is not yet approved or cleared by the [place] FDA and  has been authorized for detection and/or diagnosis of SARS-CoV-2 by FDA under an Emergency Use Authorization (EUA).  This EUA will remain in effect (meaning this test can  be used) for the duration of  the COVID-19 declaration under Section 564(b)(1) of the Act, 21 U.S.C. section 360bbb-3(b)(1), unless the authorization is terminated or revoked sooner.      Influenza A by PCR NEGATIVE NEGATIVE Final   Influenza B by PCR NEGATIVE NEGATIVE Final    Comment: (NOTE) The Xpert Xpress SARS-CoV-2/FLU/RSV assay is intended as an aid in  the diagnosis of influenza from Nasopharyngeal swab specimens and  should not be used as a sole basis for treatment. Nasal washings and  aspirates are unacceptable for Xpert Xpress SARS-CoV-2/FLU/RSV  testing.  Fact Sheet for Patients: PinkCheek.be  Fact Sheet for Healthcare Providers: GravelBags.it  This test is not yet approved or cleared by the [place] FDA and  has been authorized for detection and/or diagnosis of SARS-CoV-2 by  FDA under an Emergency Use Authorization (EUA). This EUA will remain  in effect (meaning this test can be used) for the duration of the  Covid-19 declaration under Section 564(b)(1) of the Act,  21  U.S.C. section 360bbb-3(b)(1), unless the authorization is  terminated or revoked. Performed at [facility], [address] [place],  [address]   C Difficile Quick Screen w PCR reflex     Status: None   Collection  Time: 05/27/20  1:53 PM   Specimen: STOOL  Result Value Ref Range Status   C Diff antigen NEGATIVE NEGATIVE Final   C Diff toxin NEGATIVE NEGATIVE Final   C Diff interpretation No C. difficile detected.  Final    Comment: Performed at [facility], [address] [place], [place] [address]  Gastrointestinal Panel by PCR , Stool     Status: None   Collection Time: 05/27/20  1:53 PM   Specimen: STOOL  Result Value Ref Range Status   Campylobacter species NOT DETECTED NOT DETECTED Final   Plesimonas shigelloides NOT DETECTED NOT DETECTED Final   Salmonella species NOT DETECTED NOT DETECTED Final   Yersinia enterocolitica NOT DETECTED NOT DETECTED Final   Vibrio species NOT DETECTED NOT DETECTED Final   Vibrio cholerae NOT DETECTED NOT DETECTED Final   Enteroaggregative E coli (EAEC) NOT DETECTED NOT DETECTED Final   Enteropathogenic E coli (EPEC) NOT DETECTED NOT DETECTED Final   Enterotoxigenic E coli (ETEC) NOT DETECTED NOT DETECTED Final   Shiga like toxin producing E coli (STEC) NOT DETECTED NOT DETECTED Final   Shigella/Enteroinvasive E coli (EIEC) NOT DETECTED NOT DETECTED Final   Cryptosporidium NOT DETECTED NOT DETECTED Final   Cyclospora cayetanensis NOT DETECTED NOT DETECTED Final   Entamoeba histolytica NOT DETECTED NOT DETECTED Final   Giardia lamblia NOT DETECTED NOT DETECTED Final   Adenovirus F40/41 NOT DETECTED NOT DETECTED Final   Astrovirus NOT DETECTED NOT DETECTED Final   Norovirus GI/GII NOT DETECTED NOT DETECTED Final   Rotavirus A NOT DETECTED NOT DETECTED Final   Sapovirus (I, II, IV, and V) NOT DETECTED NOT DETECTED Final    Comment: Performed at [facility], [address], [place], [place] [address]   Urine Culture     Status: None   Collection Time: 05/28/20  5:51 PM   Specimen: Urine, Random  Result Value Ref Range Status   Specimen Description   Final    URINE, RANDOM Performed at [facility], [address], [place], [place] [address]    Special Requests   Final    NONE Performed at [facility], [address] [place], [place] [address]    Culture   Final    NO GROWTH Performed at [facility], [address] [place], [place] [address]    Report Status 05/30/2020 FINAL  Final    Radiology Studies: CT HEAD WO CONTRAST  Result Date: 05/29/2020 CLINICAL DATA:  Headache.  COVID-19 positive. EXAM: CT HEAD WITHOUT CONTRAST TECHNIQUE: Contiguous axial images were obtained from the base of the skull through the vertex without intravenous contrast. COMPARISON:  02/08/2014 FINDINGS: Brain: No evidence of acute infarction, hemorrhage, hydrocephalus, extra-axial collection or mass lesion/mass effect. Mild periventricular white matter hypodensity bilaterally appears chronic. Vascular: Negative for hyperdense vessel Skull: Negative Sinuses/Orbits: Mild mucosal edema paranasal sinuses. Bilateral cataract extraction. Other: None IMPRESSION: No acute abnormality. Mild white matter changes consistent with chronic microvascular ischemia. Mild sinus mucosal edema. Electronically Signed   By: Franchot Gallo M.D.   On: 05/29/2020 18:13   ECHOCARDIOGRAM COMPLETE  Result Date: 05/29/2020    ECHOCARDIOGRAM REPORT   Patient Name:   MEAGHANN CHOO Date of Exam: 05/29/2020 Medical Rec #:  [id number]         Height:       60.0 in Accession #:    [id number]        Weight:       127.4 lb Date  of Birth:  01/11/1936         BSA:          1.541 m Patient Age:    70 years          BP:           117/46 mmHg Patient Gender: F                 HR:           50 bpm. Exam Location:  [facility] Procedure: 2D Echo, Cardiac Doppler and Color Doppler Indications:     Bradycardia  History:          Patient has no prior history of Echocardiogram examinations.                  Signs/Symptoms:Shortness of Breath; Risk Factors:Hypertension.                  Pneumonia.  Sonographer:     Sherrie Sport RDCS (AE) Referring Phys:  [place] Diagnosing Phys: Serafina Royals MD  Sonographer Comments: No parasternal window, no subcostal window and Technically challenging study due to limited acoustic windows. IMPRESSIONS  1. Left ventricular ejection fraction, by estimation, is 60 to 65%. The left ventricle has normal function. The left ventricle has no regional wall motion abnormalities. Left ventricular diastolic parameters were normal.  2. Right ventricular systolic function is normal. The right ventricular size is normal.  3. Left atrial size was mildly dilated.  4. The mitral valve is normal in structure. Mild mitral valve regurgitation.  5. The aortic valve is normal in structure. Aortic valve regurgitation is not visualized. FINDINGS  Left Ventricle: Left ventricular ejection fraction, by estimation, is 60 to 65%. The left ventricle has normal function. The left ventricle has no regional wall motion abnormalities. The left ventricular internal cavity size was normal in size. There is  no left ventricular hypertrophy. Left ventricular diastolic parameters were normal. Right Ventricle: The right ventricular size is normal. No increase in right ventricular wall thickness. Right ventricular systolic function is normal. Left Atrium: Left atrial size was mildly dilated. Right Atrium: Right atrial size was normal in size. Pericardium: There is no evidence of pericardial effusion. Mitral Valve: The mitral valve is normal in structure. Mild mitral valve regurgitation. Tricuspid Valve: The tricuspid valve is normal in structure. Tricuspid valve regurgitation is trivial. Aortic Valve: The aortic valve is normal in structure. Aortic valve regurgitation is not visualized. Aortic valve mean gradient measures 5.0 mmHg.  Aortic valve peak gradient measures 10.6 mmHg. Aortic valve area, by VTI measures 1.77 cm. Pulmonic Valve: The pulmonic valve was normal in structure. Pulmonic valve regurgitation is not visualized. Aorta: The aortic root and ascending aorta are structurally normal, with no evidence of dilitation. IAS/Shunts: No atrial level shunt detected by color flow Doppler.  LEFT VENTRICLE PLAX 2D LVIDd:         2.99 cm  Diastology LVIDs:         2.16 cm  LV e' medial:    6.09 cm/s LV PW:         1.17 cm  LV E/e' medial:  11.9 LV IVS:        1.49 cm  LV e' lateral:   9.25 cm/s LVOT diam:     2.00 cm  LV E/e' lateral: 7.8 LV SV:         60 LV SV Index:   39 LVOT Area:  3.14 cm  RIGHT VENTRICLE RV S prime:     19.60 cm/s TAPSE (M-mode): 3.0 cm LEFT ATRIUM             Index       RIGHT ATRIUM           Index LA diam:        3.70 cm 2.40 cm/m  RA Area:     10.60 cm LA Vol (A2C):   92.0 ml 59.70 ml/m RA Volume:   19.60 ml  12.72 ml/m LA Vol (A4C):   65.5 ml 42.50 ml/m LA Biplane Vol: 79.3 ml 51.45 ml/m  AORTIC VALVE AV Area (Vmax):    1.89 cm AV Area (Vmean):   1.67 cm AV Area (VTI):     1.77 cm AV Vmax:           162.50 cm/s AV Vmean:          106.600 cm/s AV VTI:            0.337 m AV Peak Grad:      10.6 mmHg AV Mean Grad:      5.0 mmHg LVOT Vmax:         97.90 cm/s LVOT Vmean:        56.800 cm/s LVOT VTI:          0.190 m LVOT/AV VTI ratio: 0.56  AORTA Ao Root diam: 2.40 cm MITRAL VALVE               TRICUSPID VALVE MV Area (PHT): 3.27 cm    TR Peak grad:   13.1 mmHg MV Decel Time: 232 msec    TR Vmax:        181.00 cm/s MV E velocity: 72.40 cm/s MV A velocity: 62.60 cm/s  SHUNTS MV E/A ratio:  1.16        Systemic VTI:  0.19 m                            Systemic Diam: 2.00 cm Serafina Royals MD Electronically signed by Serafina Royals MD Signature Date/Time: 05/29/2020/1:05:17 PM    Final    Scheduled Meds . Chlorhexidine Gluconate Cloth  6 each Topical Daily  . cholecalciferol  1,000 Units Oral Daily  .  citalopram  40 mg Oral Daily  . enoxaparin (LOVENOX) injection  40 mg Subcutaneous Q24H  . gabapentin  100 mg Oral TID  . HYDROmorphone  1 mg Oral TID  . lidocaine  1 patch Transdermal Q24H  . pantoprazole (PROTONIX) IV  40 mg Intravenous Daily  . polycarbophil  625 mg Oral Daily  . sulfaSALAzine  500 mg Oral QID  . vitamin B-12  1,000 mcg Oral Daily   Continuous Infusions: . remdesivir 200 mg in sodium chloride 0.9% 250 mL IVPB     Followed by  . [START ON 05/31/2020] remdesivir 100 mg in NS 100 mL       LOS: 2 days    Para Skeans, MD [facility] Pager [phone] If 7PM-7AM, please contact night-coverage [email or web address] Password [facility] 05/30/2020, 11:55 AM

## 2020-05-30 NOTE — Progress Notes (Signed)
Pt on bedpan to try and void. She could not at the time will try later on bsc.continue to [place]

## 2020-05-31 DIAGNOSIS — A0839 Other viral enteritis: Secondary | ICD-10-CM | POA: Diagnosis not present

## 2020-05-31 DIAGNOSIS — U071 COVID-19: Secondary | ICD-10-CM | POA: Diagnosis not present

## 2020-05-31 LAB — COMPREHENSIVE METABOLIC PANEL
ALT: 21 U/L (ref 0–44)
AST: 37 U/L (ref 15–41)
Albumin: 3.1 g/dL — ABNORMAL LOW (ref 3.5–5.0)
Alkaline Phosphatase: 49 U/L (ref 38–126)
Anion gap: 10 (ref 5–15)
BUN: 11 mg/dL (ref 8–23)
CO2: 27 mmol/L (ref 22–32)
Calcium: 8.5 mg/dL — ABNORMAL LOW (ref 8.9–10.3)
Chloride: 103 mmol/L (ref 98–111)
Creatinine, Ser: 0.72 mg/dL (ref 0.44–1.00)
GFR calc Af Amer: >60 mL/min (ref >60)
GFR calc non Af Amer: >60 mL/min (ref >60)
Glucose, Bld: 85 mg/dL (ref 70–99)
Potassium: 3.8 mmol/L (ref 3.5–5.1)
Sodium: 140 mmol/L (ref 135–145)
Total Bilirubin: 0.7 mg/dL (ref 0.3–1.2)
Total Protein: 6.3 g/dL — ABNORMAL LOW (ref 6.5–8.1)

## 2020-05-31 LAB — CBC WITH DIFFERENTIAL/PLATELET
Abs Immature Granulocytes: 0.01 10*3/uL (ref 0.00–0.07)
Basophils Absolute: 0 10*3/uL (ref 0.0–0.1)
Basophils Relative: 0 %
Eosinophils Absolute: 0 10*3/uL (ref 0.0–0.5)
Eosinophils Relative: 0 %
HCT: 39.9 % (ref 36.0–46.0)
Hemoglobin: 13.8 g/dL (ref 12.0–15.0)
Immature Granulocytes: 0 %
Lymphocytes Relative: 31 %
Lymphs Abs: 1.6 10*3/uL (ref 0.7–4.0)
MCH: 31.6 pg (ref 26.0–34.0)
MCHC: 34.6 g/dL (ref 30.0–36.0)
MCV: 91.3 fL (ref 80.0–100.0)
Monocytes Absolute: 0.6 10*3/uL (ref 0.1–1.0)
Monocytes Relative: 11 %
Neutro Abs: 2.9 10*3/uL (ref 1.7–7.7)
Neutrophils Relative %: 58 %
Platelets: 167 10*3/uL (ref 150–400)
RBC: 4.37 MIL/uL (ref 3.87–5.11)
RDW: 13.1 % (ref 11.5–15.5)
Smear Review: NORMAL
WBC: 5.1 10*3/uL (ref 4.0–10.5)
nRBC: 0 % (ref 0.0–0.2)

## 2020-05-31 LAB — URINE CULTURE: Culture: NO GROWTH

## 2020-05-31 LAB — C-REACTIVE PROTEIN: CRP: 2.9 mg/dL — ABNORMAL HIGH (ref <1.0)

## 2020-05-31 MED ORDER — CHOLESTYRAMINE 4 G PO PACK
4.0000 g | PACK | Freq: Three times a day (TID) | ORAL | Status: DC
  Administered 2020-05-31 – 2020-06-02 (×4): 4 g via ORAL
  Filled 2020-05-31 (×9): qty 1

## 2020-05-31 MED ORDER — LOPERAMIDE HCL 2 MG PO CAPS
2.0000 mg | ORAL_CAPSULE | ORAL | Status: DC | PRN
  Administered 2020-05-31 – 2020-06-01 (×5): 2 mg via ORAL
  Filled 2020-05-31 (×5): qty 1

## 2020-05-31 NOTE — Evaluation (Signed)
Occupational Therapy Evaluation Patient Details Name: [name] MRN: [id number] DOB: August 21, 1936 Today's Date: 05/31/2020    History of Present Illness [name] is a 84 year old female with PMH of pulmonary fibrosis with nocturnal hypoxia requiring 2L and hypertension. She is admitted with concerns of persistent weakness and diarrhea after testing positive for COVID.    Clinical Impression   Pt was seen for OT evaluation this date. Prior to hospital admission, pt was Indep with self care ADLs/ADL mobility. Pt lives with spouse in [facility] with 1 STE. Currently pt demonstrates impairments as described below (See OT problem list) which functionally limit her ability to perform ADL/self-care tasks. Pt currently requires MIN A with ADL transfers and MIN A with LB ADLs.  Pt would benefit from skilled OT services to address noted impairments and functional limitations (see below for any additional details) in order to maximize safety and independence while minimizing falls risk and caregiver burden. Upon hospital discharge, recommend HHOT to maximize pt safety and return to functional independence during meaningful occupations of daily life. Of note: pt's O2 monitored throughout session. Pt on RA when OT presents and satting ~95-96%. With activity/fxl mobility, 2 instances of 88-89% on RA, requires ~15-20 seconds standing rest and PLB to increase to >90%. Primarily satting at 90-93% with standing activity.    Follow Up Recommendations  Home health OT    Equipment Recommendations  Tub/shower seat    Recommendations for Other Services       Precautions / Restrictions Precautions Precautions: Fall Restrictions Weight Bearing Restrictions: No      Mobility Bed Mobility Overal bed mobility: Modified Independent Bed Mobility: Supine to Sit     Supine to sit: HOB elevated;Modified independent (Device/Increase time)     General bed mobility comments: increased time, use of bed  rails  Transfers Overall transfer level: Needs assistance Equipment used: Rolling walker (2 wheeled) Transfers: Sit to/from Stand Sit to Stand: Min assist         General transfer comment: Pt reports she doesn't use RW at home, attempted stand w/o RW and pt unsteady. More successful with stabilizing with RW with MIN A.    Balance Overall balance assessment: Needs assistance Sitting-balance support: Single extremity supported;Feet supported Sitting balance-Leahy Scale: Fair     Standing balance support: Bilateral upper extremity supported Standing balance-Leahy Scale: Fair Standing balance comment: requires B UE support with RW for stability.                           ADL either performed or assessed with clinical judgement   ADL Overall ADL's : Needs assistance/impaired     Grooming: Min guard;Standing Grooming Details (indicate cue type and reason): RW standing sink-side to perform g/h tasks         Upper Body Dressing : Set up;Sitting   Lower Body Dressing: Minimal assistance;Sit to/from stand   Toilet Transfer: Min guard;Minimal assistance;Stand-pivot   Toileting- Water quality scientist and Hygiene: Min guard;Minimal assistance;Sit to/from stand       Functional mobility during ADLs: Min guard;Rolling walker       Vision Baseline Vision/History: Wears glasses Wears Glasses: At all times Patient Visual Report: No change from baseline       Perception     Praxis      Pertinent Vitals/Pain Pain Assessment: No/denies pain     Hand Dominance     Extremity/Trunk Assessment Upper Extremity Assessment Upper Extremity Assessment: Generalized weakness;RUE deficits/detail;LUE  deficits/detail RUE Deficits / Details: shld to 3/4 range. MMT of elbow/grip grosly 4-/5. H/o rotator cuff repair. LUE Deficits / Details: shld to 1/2 range. MMT of elbow/grip grossly 4-/5. H/o reverse TSA   Lower Extremity Assessment Lower Extremity Assessment: Defer to  PT evaluation;Overall [facility] for tasks assessed;Generalized weakness (decreased endurance for standing ADLs.)       Communication Communication Communication: No difficulties   Cognition Arousal/Alertness: Awake/alert Behavior During Therapy: WFL for tasks assessed/performed Overall Cognitive Status: Within Functional Limits for tasks assessed                                     General Comments       Exercises Other Exercises Other Exercises: OT facilitates ed re: role of OT in acute setting, safety considerations, use of call light, mindful of lines/leads, safe hand placement with RW for transfers. Pt with good understanding. Other Exercises: OT engages pt in MIP x10 per side with RW with CGA. Pt tolerates well. OT engages pt in fxl mobility for [facility] dsitances. Pt on RA when OT presents and satting ~95-96%. With activity/fxl mobility, 2 instances of 88-89% on RA, requires ~15-20 seconds standing PLB to increase to >90%.   Shoulder Instructions      Home Living Family/patient expects to be discharged to:: Private residence Living Arrangements: Spouse/significant other Available Help at Discharge: Family Type of Home: House Home Access: Stairs to enter Technical brewer of Steps: 1 Entrance Stairs-Rails: None Home Layout: One level     Bathroom Shower/Tub: Teacher, early years/pre: [place]: Environmental consultant - 2 wheels;Cane - single point   Additional Comments: Pt is uses 2L of O2 at night      Prior Functioning/Environment Level of Independence: Independent        Comments: Pt has cane and walker available but doesn't use it to walk; Also wears O2 at night but hasn't needed to use in hospital; Pt reports no falls        OT Problem List: Decreased strength;Decreased range of motion;Decreased activity tolerance;Impaired balance (sitting and/or standing);Decreased knowledge of use of DME or AE;Cardiopulmonary status limiting  activity      OT Treatment/Interventions: Self-care/ADL training;Therapeutic exercise;Energy conservation;DME and/or AE instruction;Therapeutic activities;Balance training;Patient/family education    OT Goals(Current goals can be found in the care plan section) Acute Rehab OT Goals Patient Stated Goal: "to go home" OT Goal Formulation: With patient Time For Goal Achievement: 06/14/20 Potential to Achieve Goals: Good ADL Goals Pt Will Perform Lower Body Dressing: with supervision;sit to/from stand Pt Will Transfer to Toilet: with supervision;ambulating;grab bars (with LRAD to restroom) Pt Will Perform Toileting - Clothing Manipulation and hygiene: with supervision;sit to/from stand Pt/caregiver will Perform Home Exercise Program: Increased strength;Both right and left upper extremity;With Supervision (mindful of h/o b/l shld surgeries) Additional ADL Goal #1: Pt will verbalize/demo use of 3 EC strategies for ADLs with 0% verbal cues.  OT Frequency: Min 1X/week   Barriers to D/C:            Co-evaluation              AM-PAC OT "6 Clicks" Daily Activity     Outcome Measure Help from another person eating meals?: None Help from another person taking care of personal grooming?: A Little Help from another person toileting, which includes using toliet, bedpan, or urinal?: A Little Help from another  person bathing (including washing, rinsing, drying)?: A Little Help from another person to put on and taking off regular upper body clothing?: None Help from another person to put on and taking off regular lower body clothing?: A Little 6 Click Score: 20   End of Session Equipment Utilized During Treatment: Gait belt;Rolling walker Nurse Communication: Mobility status;Other (comment) (oxygen status-slight de-sat with activity, but able to correct with standing rest and PLB.)  Activity Tolerance: Patient tolerated treatment well Patient left: in bed;with call bell/phone within  reach;with bed alarm set  OT Visit Diagnosis: Unsteadiness on feet (R26.81);Muscle weakness (generalized) (M62.81)                Time: 6751-9824 OT Time Calculation (min): 39 min Charges:  OT General Charges $OT Visit: 1 Visit OT Evaluation $OT Eval Moderate Complexity: 1 Mod OT Treatments $Self Care/Home Management : 8-22 mins $Therapeutic Activity: 8-22 mins  Gerrianne Scale, MS, OTR/L ascom [phone] 05/31/20, 5:39 PM

## 2020-05-31 NOTE — Progress Notes (Signed)
PROGRESS NOTE    [name]  YQM:[id number] DOB: 12/19/1935 DOA: 05/27/2020 PCP: Doreene Nest, NP    Brief Narrative:  [name] is an 84 year old female with past medical history notable for pulmonary fibrosis with nocturnal hypoxia on 2 L nasal cannula qHS, essential hypertension who presented to the ED with progressive weakness and diarrhea.  Patient recently found to be positive for Covid-19 outpatient with resultant diarrhea.  Patient also with associated poor oral intake.  Patient has previously see if the Pfizer Covid-19 vaccination on October 27, 2019 and November 17, 2019.   In the ED, patient was afebrile, oxygenating well on room air.  WBC within normal limits.  C. difficile and GI PCR panel negative.  Creatinine 0.79.  Patient referred for admission due to inability to tolerate oral intake with associated profuse diarrhea.     Assessment & Plan:   Principal Problem:   Gastroenteritis due to COVID-19 virus Active Problems:   HTN (hypertension)   Postinflammatory pulmonary fibrosis (HCC)   COVID-19 virus infection   Acute viral gastroenteritis secondary to Covid-19 viral infection Patient presenting with profuse diarrhea associated with poor oral intake and nausea.  Unable to tolerate much oral intake at home.  Covid-19 PCR positive on 05/27/2020.  Currently oxygenating well on room air. C. difficile/GI PCR panel negative. --Remdesivir Day #2/5 --Start Questran 4 g p.o. 3 times daily --Continue Imodium 2 mg prn --Desitin ointment to perirectal region as needed for irritation --Continue to monitor bowel movements closely --Supportive care, antiemetics --Encourage increased oral intake  History of pulmonary fibrosis --Continue nocturnal supplemental oxygen at 2 L per nasal cannula prn  Chronic low back pain --Dilaudid 1 mg PO TID --Lidocaine patch --Voltaren topical gel --Gabapentin 100mg  PO TID  Weakness/deconditioning/debility: --PT recommends  home health on discharge, continue PT efforts while inpatient  Depression/anxiety: Continue Celexa 40 mg p.o. daily   DVT prophylaxis: Lovenox Code Status: Full code Family Communication: Updated patient extensively at bedside  Disposition Plan:  Status is: Inpatient  Remains inpatient appropriate because:Ongoing active pain requiring inpatient pain management, Unsafe d/c plan, IV treatments appropriate due to intensity of illness or inability to take PO and Inpatient level of care appropriate due to severity of illness   Dispo: The patient is from: Home              Anticipated d/c is to: Home              Anticipated d/c date is: 3 days              Patient currently is not medically stable to d/c.    Consultants:   none  Procedures:   None  Antimicrobials:   None   Subjective: Patient seen and examined bedside, resting comfortably.  Continues to complain of diffuse diarrhea.  Would like her Imodium restarted.  States has not been out of bed and feels very weak and debilitated.  No other questions or concerns at this time.  Denies headache, no visual changes, no chest pain, palpitations, no abdominal pain, no shortness of breath, no fever/chills/night sweats.  No acute events overnight per nursing staff.  Objective: Vitals:   05/31/20 0435 05/31/20 0735 05/31/20 0744 05/31/20 1157  BP: 107/89  (!) 126/56 113/61  Pulse: (!) 53 (!) 52 (!) 51 (!) 57  Resp: 18 18 20 20   Temp: 98.4 F (36.9 C)  98.7 F (37.1 C) 98.3 F (36.8 C)  TempSrc: Oral  Oral Oral  SpO2: 94% 96% 92% 96%  Weight:      Height:        Intake/Output Summary (Last 24 hours) at 05/31/2020 1630 Last data filed at 05/30/2020 2200 Gross per 24 hour  Intake --  Output 350 ml  Net -350 ml   Filed Weights   05/27/20 1224  Weight: 57.8 kg    Examination:  General exam: Appears calm and comfortable  Respiratory system: Clear to auscultation. Respiratory effort normal.  Oxygen well on room  air Cardiovascular system: S1 & S2 heard, RRR. No JVD, murmurs, rubs, gallops or clicks. No pedal edema. Gastrointestinal system: Abdomen is nondistended, soft and nontender. No organomegaly or masses felt. Normal bowel sounds heard. Central nervous system: Alert and oriented. No focal neurological deficits. Extremities: Symmetric 5 x 5 power. Skin: No rashes, lesions or ulcers Psychiatry: Judgement and insight appear normal. Mood & affect appropriate.     Data Reviewed: I have personally reviewed following labs and imaging studies  CBC: Recent Labs  Lab 05/27/20 1241 05/28/20 0432 05/29/20 0502 05/30/20 0553 05/31/20 0609  WBC 8.6 5.3 4.1 5.8 5.1  NEUTROABS  --  3.3 2.3 3.8 2.9  HGB 16.4* 14.9 12.4 13.1 13.8  HCT 47.6* 45.3 38.0 38.3 39.9  MCV 91.7 95.2 95.5 92.1 91.3  PLT 200 200 164 145* 167   Basic Metabolic Panel: Recent Labs  Lab 05/27/20 1642 05/28/20 0432 05/29/20 0502 05/30/20 0553 05/31/20 0609  NA 139 142 140 140 140  K 3.2* 3.7 4.3 4.2 3.8  CL 106 109 105 103 103  CO2 20* 22 26 26 27   GLUCOSE 85 75 73 87 85  BUN 19 23 18 10 11   CREATININE 0.79 0.87 0.80 0.76 0.72  CALCIUM 8.9 8.6* 8.0* 8.2* 8.5*  MG  --  2.1 2.0  --   --   PHOS  --  3.3  --   --   --    GFR: Estimated Creatinine Clearance: 43.1 mL/min (by C-G formula based on SCr of 0.72 mg/dL). Liver Function Tests: Recent Labs  Lab 05/27/20 1642 05/28/20 0432 05/29/20 0502 05/30/20 0553 05/31/20 0609  AST 38 38 31 32 37  ALT 22 21 17 17 21   ALKPHOS 65 57 46 48 49  BILITOT 0.8 0.8 0.8 0.9 0.7  PROT 7.1 6.8 5.3* 5.5* 6.3*  ALBUMIN 3.5 3.3* 2.8* 3.1* 3.1*   No results for input(s): LIPASE, AMYLASE in the last 168 hours. No results for input(s): AMMONIA in the last 168 hours. Coagulation Profile: No results for input(s): INR, PROTIME in the last 168 hours. Cardiac Enzymes: No results for input(s): CKTOTAL, CKMB, CKMBINDEX, TROPONINI in the last 168 hours. BNP (last 3 results) No results  for input(s): PROBNP in the last 8760 hours. HbA1C: No results for input(s): HGBA1C in the last 72 hours. CBG: No results for input(s): GLUCAP in the last 168 hours. Lipid Profile: No results for input(s): CHOL, HDL, LDLCALC, TRIG, CHOLHDL, LDLDIRECT in the last 72 hours. Thyroid Function Tests: No results for input(s): TSH, T4TOTAL, FREET4, T3FREE, THYROIDAB in the last 72 hours. Anemia Panel: No results for input(s): VITAMINB12, FOLATE, FERRITIN, TIBC, IRON, RETICCTPCT in the last 72 hours. Sepsis Labs: No results for input(s): PROCALCITON, LATICACIDVEN in the last 168 hours.  Recent Results (from the past 240 hour(s))  Respiratory Panel by RT PCR (Flu A&B, Covid) - Nasopharyngeal Swab     Status: Abnormal   Collection Time: 05/27/20 12:41 PM   Specimen: Nasopharyngeal Swab  Result  Value Ref Range Status   SARS Coronavirus 2 by RT PCR POSITIVE (A) NEGATIVE Final    Comment: RESULT CALLED TO, READ BACK BY AND VERIFIED WITH: Martie Round RN AT 1417 ON 05/27/20 SNG (NOTE) SARS-CoV-2 target nucleic acids are DETECTED.  SARS-CoV-2 RNA is generally detectable in upper respiratory specimens  during the acute phase of infection. Positive results are indicative of the presence of the identified virus, but do not rule out bacterial infection or co-infection with other pathogens not detected by the test. Clinical correlation with patient history and other diagnostic information is necessary to determine patient infection status. The expected result is Negative.  Fact Sheet for Patients:  https://www.moore.com/  Fact Sheet for Healthcare Providers: https://www.young.biz/  This test is not yet approved or cleared by the [place] FDA and  has been authorized for detection and/or diagnosis of SARS-CoV-2 by FDA under an Emergency Use Authorization (EUA).  This EUA will remain in effect (meaning this test can  be used) for the duration of  the  COVID-19 declaration under Section 564(b)(1) of the Act, 21 U.S.C. section 360bbb-3(b)(1), unless the authorization is terminated or revoked sooner.      Influenza A by PCR NEGATIVE NEGATIVE Final   Influenza B by PCR NEGATIVE NEGATIVE Final    Comment: (NOTE) The Xpert Xpress SARS-CoV-2/FLU/RSV assay is intended as an aid in  the diagnosis of influenza from Nasopharyngeal swab specimens and  should not be used as a sole basis for treatment. Nasal washings and  aspirates are unacceptable for Xpert Xpress SARS-CoV-2/FLU/RSV  testing.  Fact Sheet for Patients: https://www.moore.com/  Fact Sheet for Healthcare Providers: https://www.young.biz/  This test is not yet approved or cleared by the [place] FDA and  has been authorized for detection and/or diagnosis of SARS-CoV-2 by  FDA under an Emergency Use Authorization (EUA). This EUA will remain  in effect (meaning this test can be used) for the duration of the  Covid-19 declaration under Section 564(b)(1) of the Act, 21  U.S.C. section 360bbb-3(b)(1), unless the authorization is  terminated or revoked. Performed at [facility], [address] [place], [place] [address]   C Difficile Quick Screen w PCR reflex     Status: None   Collection Time: 05/27/20  1:53 PM   Specimen: STOOL  Result Value Ref Range Status   C Diff antigen NEGATIVE NEGATIVE Final   C Diff toxin NEGATIVE NEGATIVE Final   C Diff interpretation No C. difficile detected.  Final    Comment: Performed at [facility], [address] [place], [place] [address]  Gastrointestinal Panel by PCR , Stool     Status: None   Collection Time: 05/27/20  1:53 PM   Specimen: STOOL  Result Value Ref Range Status   Campylobacter species NOT DETECTED NOT DETECTED Final   Plesimonas shigelloides NOT DETECTED NOT DETECTED Final   Salmonella species NOT DETECTED NOT DETECTED Final   Yersinia enterocolitica NOT  DETECTED NOT DETECTED Final   Vibrio species NOT DETECTED NOT DETECTED Final   Vibrio cholerae NOT DETECTED NOT DETECTED Final   Enteroaggregative E coli (EAEC) NOT DETECTED NOT DETECTED Final   Enteropathogenic E coli (EPEC) NOT DETECTED NOT DETECTED Final   Enterotoxigenic E coli (ETEC) NOT DETECTED NOT DETECTED Final   Shiga like toxin producing E coli (STEC) NOT DETECTED NOT DETECTED Final   Shigella/Enteroinvasive E coli (EIEC) NOT DETECTED NOT DETECTED Final   Cryptosporidium NOT DETECTED NOT DETECTED Final   Cyclospora cayetanensis  NOT DETECTED NOT DETECTED Final   Entamoeba histolytica NOT DETECTED NOT DETECTED Final   Giardia lamblia NOT DETECTED NOT DETECTED Final   Adenovirus F40/41 NOT DETECTED NOT DETECTED Final   Astrovirus NOT DETECTED NOT DETECTED Final   Norovirus GI/GII NOT DETECTED NOT DETECTED Final   Rotavirus A NOT DETECTED NOT DETECTED Final   Sapovirus (I, II, IV, and V) NOT DETECTED NOT DETECTED Final    Comment: Performed at [facility], [address] [place], [place] [address]  Urine Culture     Status: None   Collection Time: 05/28/20  5:51 PM   Specimen: Urine, Random  Result Value Ref Range Status   Specimen Description   Final    URINE, RANDOM Performed at [facility], [address] [place], [place] [address]    Special Requests   Final    NONE Performed at [facility], [address], [place], [place] [address]    Culture   Final    NO GROWTH Performed at [facility], [address], [place], [place] [address]    Report Status 05/30/2020 FINAL  Final  Urine Culture     Status: None   Collection Time: 05/30/20  6:20 AM   Specimen: Urine, Random  Result Value Ref Range Status   Specimen Description   Final    URINE, RANDOM Performed at [facility], [address] [place], [place] [address]    Special Requests   Final    NONE Performed at [name], [address]  [place], [place] [address]    Culture   Final    NO GROWTH Performed at [facility], [address], [place], [place] [address]    Report Status 05/31/2020 FINAL  Final         Radiology Studies: CT HEAD WO CONTRAST  Result Date: 05/29/2020 CLINICAL DATA:  Headache.  COVID-19 positive. EXAM: CT HEAD WITHOUT CONTRAST TECHNIQUE: Contiguous axial images were obtained from the base of the skull through the vertex without intravenous contrast. COMPARISON:  02/08/2014 FINDINGS: Brain: No evidence of acute infarction, hemorrhage, hydrocephalus, extra-axial collection or mass lesion/mass effect. Mild periventricular white matter hypodensity bilaterally appears chronic. Vascular: Negative for hyperdense vessel Skull: Negative Sinuses/Orbits: Mild mucosal edema paranasal sinuses. Bilateral cataract extraction. Other: None IMPRESSION: No acute abnormality. Mild white matter changes consistent with chronic microvascular ischemia. Mild sinus mucosal edema. Electronically Signed   By: Marlan Palau M.D.   On: 05/29/2020 18:13        Scheduled Meds: . cholecalciferol  1,000 Units Oral Daily  . cholestyramine  4 g Oral TID  . citalopram  40 mg Oral Daily  . enoxaparin (LOVENOX) injection  40 mg Subcutaneous Q24H  . gabapentin  100 mg Oral TID  . HYDROmorphone  1 mg Oral TID  . lidocaine  1 patch Transdermal Q24H  . pantoprazole (PROTONIX) IV  40 mg Intravenous Daily  . polycarbophil  625 mg Oral Daily  . sulfaSALAzine  500 mg Oral QID  . vitamin B-12  1,000 mcg Oral Daily   Continuous Infusions: . remdesivir 100 mg in NS 100 mL 100 mg (05/31/20 0915)     LOS: 3 days    Time spent: 37 minutes spent on chart review, discussion with nursing staff, consultants, updating family and interview/physical exam; more than 50% of that time was spent in counseling and/or coordination of care.    Alvira Philips [place], DO [facility] Available  via Epic secure chat 7am-7pm After these hours,  please refer to coverage provider listed on [email or web address] 05/31/2020, 4:30 PM

## 2020-05-31 NOTE — Evaluation (Signed)
Physical Therapy Evaluation Patient Details Name: [name] MRN: [id number] DOB: Apr 22, 1936 Today's Date: 05/31/2020   History of Present Illness  [name] is a 84 year old female with PMH of pulmonary fibrosis with nocturnal hypoxia requiring 2L and hypertension. She is admitted with concerns of persistent weakness and diarrhea after testing positive for COVID.   Clinical Impression  Pt presents in fowler's position in bed and agreeable to PT evaluation. Prior to admission, she was independent with all ADLs and walking without any AD. Now, she notes being weak and worn out but not in any pain. She denies any nausea/vomitting sx. Pt has good functional strength, but decreased endurance in LE. Her UEs are stronger than LE. Pt is modified independent for bed mobility as she is able to sit EOB by herself with HOB elevated, bilateral UE support of bed rail, and increased time. During sit to stand transfer, pt is minA+1 as she needed help to stand up. VCs needed for proper placement of hands. Before ambulating, Pt instructed on how to use RW with demonstration before using it for gait. Pt had decreased step length with step to pattern, decreased gait speed, and fatigues quickly. Pt sat down in recliner chair with VCs for proper technique. O2 sat monitored throughout session, and stayed between 92-99% on RA. Pt uses O2 at night, but notes she hasn't needed to use it during her hospital stay. Pt will benefit from skilled PT services and upon HHPT upon discharge to address deficits in strength, balance, and endurance.     Follow Up Recommendations Home health PT    Equipment Recommendations  None recommended by PT    Recommendations for Other Services       Precautions / Restrictions Precautions Precautions: Fall Restrictions Weight Bearing Restrictions: No      Mobility  Bed Mobility Overal bed mobility: Modified Independent Bed Mobility: Supine to Sit     Supine to sit: HOB  elevated;Modified independent (Device/Increase time)     General bed mobility comments: Pt able to sit EOB by herself, but takes increased time and uses bed rails  Transfers Overall transfer level: Needs assistance Equipment used: Rolling walker (2 wheeled) Transfers: Sit to/from Stand Sit to Stand: Min assist         General transfer comment: VCs needed for proper placement of hands and needed minA+1 to help stand up  Ambulation/Gait Ambulation/Gait assistance: Min assist Gait Distance (Feet): 5 Feet Assistive device: Rolling walker (2 wheeled) Gait Pattern/deviations: Step-to pattern;Decreased step length - right;Decreased step length - left     General Gait Details: Pt instructed on how to use RW with demonstration before using it for gait. Pt had decreased step length with step to pattern and decreased gait speed. She fatigues quicly and monitored vitals c mobility. All ambulation performed on room air  Stairs            Wheelchair Mobility    Modified Rankin (Stroke Patients Only)       Balance Overall balance assessment: Needs assistance Sitting-balance support: Single extremity supported;Feet supported Sitting balance-Leahy Scale: Fair Sitting balance - Comments: Pt able to maintain balance with RUE holding onto rail and feet support.   Standing balance support: Bilateral upper extremity supported Standing balance-Leahy Scale: Fair Standing balance comment: Pt able to maintain standing balance with UE support on RW.  Pertinent Vitals/Pain Pain Assessment: No/denies pain (Weak and worn out but not actual pain)    Home Living Family/patient expects to be discharged to:: Private residence Living Arrangements: Spouse/significant other   Type of Home: House Home Access: Stairs to enter Entrance Stairs-Rails: None Entrance Stairs-Number of Steps: 1 Home Layout: One level Home Equipment: Environmental consultant - 2 wheels;Cane -  single point Additional Comments: Pt is uses 2L of O2 at night    Prior Function Level of Independence: Independent         Comments: Pt has cane and walker available but doesn't use it to walk; Also wears O2 at night but hasn't needed to use in hospital; Pt reports no falls     Hand Dominance        Extremity/Trunk Assessment   Upper Extremity Assessment Upper Extremity Assessment: Generalized weakness (UE stronger than LE; 4/5 BUE)    Lower Extremity Assessment Lower Extremity Assessment: Generalized weakness (3+/5 in BLE; good functional strength, decreased endurance)       Communication   Communication: No difficulties  Cognition Arousal/Alertness: Awake/alert Behavior During Therapy: WFL for tasks assessed/performed Overall Cognitive Status: Within Functional Limits for tasks assessed                                        General Comments      Exercises General Exercises - Lower Extremity Quad Sets: AROM;Both;5 reps;Supine Hip ABduction/ADduction: AROM;Both;5 reps;Supine Straight Leg Raises: AROM;Both;10 reps;Supine   Assessment/Plan    PT Assessment Patient needs continued PT services  PT Problem List Decreased strength;Decreased activity tolerance;Decreased balance;Decreased mobility;Decreased coordination       PT Treatment Interventions DME instruction;Gait training;Stair training;Functional mobility training;Therapeutic activities;Therapeutic exercise;Balance training;Neuromuscular re-education    PT Goals (Current goals can be found in the Care Plan section)  Acute Rehab PT Goals Patient Stated Goal: "to go home" PT Goal Formulation: With patient Time For Goal Achievement: 06/14/20 Potential to Achieve Goals: Good Additional Goals Additional Goal #1: Pt will be independent with bed mobility and transfers in order to return to PLOF with minimal difficulties.    Frequency Min 2X/week   Barriers to discharge         Co-evaluation               AM-PAC PT "6 Clicks" Mobility  Outcome Measure Help needed turning from your back to your side while in a flat bed without using bedrails?: A Little Help needed moving from lying on your back to sitting on the side of a flat bed without using bedrails?: A Little Help needed moving to and from a bed to a chair (including a wheelchair)?: A Little Help needed standing up from a chair using your arms (e.g., wheelchair or bedside chair)?: A Lot Help needed to walk in hospital room?: A Lot Help needed climbing 3-5 steps with a railing? : A Lot 6 Click Score: 15    End of Session Equipment Utilized During Treatment: Gait belt Activity Tolerance: Patient limited by fatigue Patient left: in chair;with call bell/phone within reach;with chair alarm set Nurse Communication: Mobility status PT Visit Diagnosis: Unsteadiness on feet (R26.81);Other abnormalities of gait and mobility (R26.89);Muscle weakness (generalized) (M62.81);Difficulty in walking, not elsewhere classified (R26.2)    Time: 0174-9449 PT Time Calculation (min) (ACUTE ONLY): 30 min   Charges:   PT Evaluation $PT Eval Moderate Complexity: 1 Mod PT Treatments $Therapeutic Exercise: 8-22  mins        Kimmie Jacelyn Grip, SPT Bernita Raisin 05/31/2020, 1:32 PM

## 2020-06-01 DIAGNOSIS — A0839 Other viral enteritis: Secondary | ICD-10-CM | POA: Diagnosis not present

## 2020-06-01 DIAGNOSIS — U071 COVID-19: Secondary | ICD-10-CM | POA: Diagnosis not present

## 2020-06-01 LAB — CBC WITH DIFFERENTIAL/PLATELET
Abs Immature Granulocytes: 0.01 10*3/uL (ref 0.00–0.07)
Basophils Absolute: 0 10*3/uL (ref 0.0–0.1)
Basophils Relative: 0 %
Eosinophils Absolute: 0 10*3/uL (ref 0.0–0.5)
Eosinophils Relative: 1 %
HCT: 37.5 % (ref 36.0–46.0)
Hemoglobin: 13.4 g/dL (ref 12.0–15.0)
Immature Granulocytes: 0 %
Lymphocytes Relative: 28 %
Lymphs Abs: 1.8 10*3/uL (ref 0.7–4.0)
MCH: 31.1 pg (ref 26.0–34.0)
MCHC: 35.7 g/dL (ref 30.0–36.0)
MCV: 87 fL (ref 80.0–100.0)
Monocytes Absolute: 0.7 10*3/uL (ref 0.1–1.0)
Monocytes Relative: 11 %
Neutro Abs: 4 10*3/uL (ref 1.7–7.7)
Neutrophils Relative %: 60 %
Platelets: 176 10*3/uL (ref 150–400)
RBC: 4.31 MIL/uL (ref 3.87–5.11)
RDW: 12.9 % (ref 11.5–15.5)
Smear Review: NORMAL
WBC: 6.6 10*3/uL (ref 4.0–10.5)
nRBC: 0 % (ref 0.0–0.2)

## 2020-06-01 LAB — COMPREHENSIVE METABOLIC PANEL
ALT: 20 U/L (ref 0–44)
AST: 35 U/L (ref 15–41)
Albumin: 3 g/dL — ABNORMAL LOW (ref 3.5–5.0)
Alkaline Phosphatase: 43 U/L (ref 38–126)
Anion gap: 9 (ref 5–15)
BUN: 13 mg/dL (ref 8–23)
CO2: 29 mmol/L (ref 22–32)
Calcium: 8.4 mg/dL — ABNORMAL LOW (ref 8.9–10.3)
Chloride: 104 mmol/L (ref 98–111)
Creatinine, Ser: 0.8 mg/dL (ref 0.44–1.00)
GFR calc Af Amer: >60 mL/min (ref >60)
GFR calc non Af Amer: >60 mL/min (ref >60)
Glucose, Bld: 88 mg/dL (ref 70–99)
Potassium: 4.3 mmol/L (ref 3.5–5.1)
Sodium: 142 mmol/L (ref 135–145)
Total Bilirubin: 0.8 mg/dL (ref 0.3–1.2)
Total Protein: 5.4 g/dL — ABNORMAL LOW (ref 6.5–8.1)

## 2020-06-01 LAB — C-REACTIVE PROTEIN: CRP: 1.3 mg/dL — ABNORMAL HIGH (ref <1.0)

## 2020-06-01 LAB — MAGNESIUM: Magnesium: 2 mg/dL (ref 1.7–2.4)

## 2020-06-01 MED ORDER — ALPRAZOLAM 0.25 MG PO TABS
0.2500 mg | ORAL_TABLET | Freq: Three times a day (TID) | ORAL | Status: DC | PRN
  Administered 2020-06-01: 0.25 mg via ORAL
  Filled 2020-06-01: qty 1

## 2020-06-01 MED ORDER — PANTOPRAZOLE SODIUM 40 MG PO TBEC
40.0000 mg | DELAYED_RELEASE_TABLET | Freq: Every day | ORAL | Status: DC
  Administered 2020-06-02 – 2020-06-03 (×2): 40 mg via ORAL
  Filled 2020-06-01 (×2): qty 1

## 2020-06-01 MED ORDER — ALUM & MAG HYDROXIDE-SIMETH 200-200-20 MG/5ML PO SUSP
30.0000 mL | Freq: Four times a day (QID) | ORAL | Status: DC | PRN
  Administered 2020-06-01: 12:00:00 30 mL via ORAL
  Filled 2020-06-01: qty 30

## 2020-06-01 NOTE — Progress Notes (Signed)
PROGRESS NOTE    [name]  HYQ:[id number] DOB: Apr 15, 1936 DOA: 05/27/2020 PCP: Doreene Nest, NP    Brief Narrative:  [name] is an 84 year old female with past medical history notable for pulmonary fibrosis with nocturnal hypoxia on 2 L nasal cannula qHS, essential hypertension who presented to the ED with progressive weakness and diarrhea.  Patient recently found to be positive for Covid-19 outpatient with resultant diarrhea.  Patient also with associated poor oral intake.  Patient has previously see if the Pfizer Covid-19 vaccination on October 27, 2019 and November 17, 2019.   In the ED, patient was afebrile, oxygenating well on room air.  WBC within normal limits.  C. difficile and GI PCR panel negative.  Creatinine 0.79.  Patient referred for admission due to inability to tolerate oral intake with associated profuse diarrhea.     Assessment & Plan:   Principal Problem:   Gastroenteritis due to COVID-19 virus Active Problems:   HTN (hypertension)   Postinflammatory pulmonary fibrosis (HCC)   COVID-19 virus infection   Acute viral gastroenteritis secondary to Covid-19 viral infection Patient presenting with profuse diarrhea associated with poor oral intake and nausea.  Unable to tolerate much oral intake at home.  Covid-19 PCR positive on 05/27/2020.  Currently oxygenating well on room air. C. difficile/GI PCR panel negative. --Remdesivir Day #3/5 --Continue Questran 4 g p.o. 3 times daily --Continue Imodium 2 mg prn --Desitin ointment to perirectal region as needed for irritation --Continue to monitor bowel movements closely --Supportive care, antiemetics --Encourage increased oral intake  History of pulmonary fibrosis --Continue nocturnal supplemental oxygen at 2 L per nasal cannula prn  Chronic low back pain --Dilaudid 1 mg PO TID --Lidocaine patch --Voltaren topical gel --Gabapentin 100mg  PO TID  Weakness/deconditioning/debility: --PT recommends  home health on discharge, continue PT efforts while inpatient  Depression/anxiety: Continue Celexa 40 mg p.o. daily   DVT prophylaxis: Lovenox Code Status: Full code Family Communication: Updated patient extensively at bedside  Disposition Plan:  Status is: Inpatient  Remains inpatient appropriate because:Ongoing active pain requiring inpatient pain management, Unsafe d/c plan, IV treatments appropriate due to intensity of illness or inability to take PO and Inpatient level of care appropriate due to severity of illness   Dispo: The patient is from: Home              Anticipated d/c is to: Home              Anticipated d/c date is: 2 days              Patient currently is not medically stable to d/c.    Consultants:   none  Procedures:   None  Antimicrobials:   None   Subjective: Patient seen and examined bedside, resting comfortably.  Reports feels much better today, diarrhea improved.  Still reports decreased appetite.  States unable to obtain transportation to receive outpatient remdesivir infusions and will need to remain inpatient until completion.  No other questions or concerns at this time.  Denies headache, no visual changes, no chest pain, palpitations, no abdominal pain, no shortness of breath, no fever/chills/night sweats.  No acute events overnight per nursing staff.  Objective: Vitals:   06/01/20 0038 06/01/20 0529 06/01/20 0851 06/01/20 0858  BP: (!) 87/54 (!) 107/37 (!) 124/47   Pulse: (!) 56 (!) 55 (!) 56   Resp: 16 17 19    Temp: 98.2 F (36.8 C) 98.7 F (37.1 C) 98.6 F (37 C)  TempSrc:   Oral   SpO2: 94% 98% 100% 95%  Weight:      Height:       No intake or output data in the 24 hours ending 06/01/20 1419 Filed Weights   05/27/20 1224  Weight: 57.8 kg    Examination:  General exam: Appears calm and comfortable  Respiratory system: Clear to auscultation. Respiratory effort normal.  Oxygenating well on room air Cardiovascular system: S1  & S2 heard, RRR. No JVD, murmurs, rubs, gallops or clicks. No pedal edema. Gastrointestinal system: Abdomen is nondistended, soft and nontender. No organomegaly or masses felt. Normal bowel sounds heard. Central nervous system: Alert and oriented. No focal neurological deficits. Extremities: Symmetric 5 x 5 power. Skin: No rashes, lesions or ulcers Psychiatry: Judgement and insight appear normal. Mood & affect appropriate.     Data Reviewed: I have personally reviewed following labs and imaging studies  CBC: Recent Labs  Lab 05/28/20 0432 05/29/20 0502 05/30/20 0553 05/31/20 0609 06/01/20 0352  WBC 5.3 4.1 5.8 5.1 6.6  NEUTROABS 3.3 2.3 3.8 2.9 4.0  HGB 14.9 12.4 13.1 13.8 13.4  HCT 45.3 38.0 38.3 39.9 37.5  MCV 95.2 95.5 92.1 91.3 87.0  PLT 200 164 145* 167 176   Basic Metabolic Panel: Recent Labs  Lab 05/28/20 0432 05/29/20 0502 05/30/20 0553 05/31/20 0609 06/01/20 0352  NA 142 140 140 140 142  K 3.7 4.3 4.2 3.8 4.3  CL 109 105 103 103 104  CO2 22 26 26 27 29   GLUCOSE 75 73 87 85 88  BUN 23 18 10 11 13   CREATININE 0.87 0.80 0.76 0.72 0.80  CALCIUM 8.6* 8.0* 8.2* 8.5* 8.4*  MG 2.1 2.0  --   --  2.0  PHOS 3.3  --   --   --   --    GFR: Estimated Creatinine Clearance: 43.1 mL/min (by C-G formula based on SCr of 0.8 mg/dL). Liver Function Tests: Recent Labs  Lab 05/28/20 0432 05/29/20 0502 05/30/20 0553 05/31/20 0609 06/01/20 0352  AST 38 31 32 37 35  ALT 21 17 17 21 20   ALKPHOS 57 46 48 49 43  BILITOT 0.8 0.8 0.9 0.7 0.8  PROT 6.8 5.3* 5.5* 6.3* 5.4*  ALBUMIN 3.3* 2.8* 3.1* 3.1* 3.0*   No results for input(s): LIPASE, AMYLASE in the last 168 hours. No results for input(s): AMMONIA in the last 168 hours. Coagulation Profile: No results for input(s): INR, PROTIME in the last 168 hours. Cardiac Enzymes: No results for input(s): CKTOTAL, CKMB, CKMBINDEX, TROPONINI in the last 168 hours. BNP (last 3 results) No results for input(s): PROBNP in the last  8760 hours. HbA1C: No results for input(s): HGBA1C in the last 72 hours. CBG: No results for input(s): GLUCAP in the last 168 hours. Lipid Profile: No results for input(s): CHOL, HDL, LDLCALC, TRIG, CHOLHDL, LDLDIRECT in the last 72 hours. Thyroid Function Tests: No results for input(s): TSH, T4TOTAL, FREET4, T3FREE, THYROIDAB in the last 72 hours. Anemia Panel: No results for input(s): VITAMINB12, FOLATE, FERRITIN, TIBC, IRON, RETICCTPCT in the last 72 hours. Sepsis Labs: No results for input(s): PROCALCITON, LATICACIDVEN in the last 168 hours.  Recent Results (from the past 240 hour(s))  Respiratory Panel by RT PCR (Flu A&B, Covid) - Nasopharyngeal Swab     Status: Abnormal   Collection Time: 05/27/20 12:41 PM   Specimen: Nasopharyngeal Swab  Result Value Ref Range Status   SARS Coronavirus 2 by RT PCR POSITIVE (A) NEGATIVE Final  Comment: RESULT CALLED TO, READ BACK BY AND VERIFIED WITH: Martie Round RN AT 1417 ON 05/27/20 SNG (NOTE) SARS-CoV-2 target nucleic acids are DETECTED.  SARS-CoV-2 RNA is generally detectable in upper respiratory specimens  during the acute phase of infection. Positive results are indicative of the presence of the identified virus, but do not rule out bacterial infection or co-infection with other pathogens not detected by the test. Clinical correlation with patient history and other diagnostic information is necessary to determine patient infection status. The expected result is Negative.  Fact Sheet for Patients:  https://www.moore.com/  Fact Sheet for Healthcare Providers: https://www.young.biz/  This test is not yet approved or cleared by the [place] FDA and  has been authorized for detection and/or diagnosis of SARS-CoV-2 by FDA under an Emergency Use Authorization (EUA).  This EUA will remain in effect (meaning this test can  be used) for the duration of  the COVID-19 declaration under Section  564(b)(1) of the Act, 21 U.S.C. section 360bbb-3(b)(1), unless the authorization is terminated or revoked sooner.      Influenza A by PCR NEGATIVE NEGATIVE Final   Influenza B by PCR NEGATIVE NEGATIVE Final    Comment: (NOTE) The Xpert Xpress SARS-CoV-2/FLU/RSV assay is intended as an aid in  the diagnosis of influenza from Nasopharyngeal swab specimens and  should not be used as a sole basis for treatment. Nasal washings and  aspirates are unacceptable for Xpert Xpress SARS-CoV-2/FLU/RSV  testing.  Fact Sheet for Patients: https://www.moore.com/  Fact Sheet for Healthcare Providers: https://www.young.biz/  This test is not yet approved or cleared by the [place] FDA and  has been authorized for detection and/or diagnosis of SARS-CoV-2 by  FDA under an Emergency Use Authorization (EUA). This EUA will remain  in effect (meaning this test can be used) for the duration of the  Covid-19 declaration under Section 564(b)(1) of the Act, 21  U.S.C. section 360bbb-3(b)(1), unless the authorization is  terminated or revoked. Performed at [facility], [address] [place], [place] [address]   C Difficile Quick Screen w PCR reflex     Status: None   Collection Time: 05/27/20  1:53 PM   Specimen: STOOL  Result Value Ref Range Status   C Diff antigen NEGATIVE NEGATIVE Final   C Diff toxin NEGATIVE NEGATIVE Final   C Diff interpretation No C. difficile detected.  Final    Comment: Performed at [facility], [address] [place], [place] [address]  Gastrointestinal Panel by PCR , Stool     Status: None   Collection Time: 05/27/20  1:53 PM   Specimen: STOOL  Result Value Ref Range Status   Campylobacter species NOT DETECTED NOT DETECTED Final   Plesimonas shigelloides NOT DETECTED NOT DETECTED Final   Salmonella species NOT DETECTED NOT DETECTED Final   Yersinia enterocolitica NOT DETECTED NOT DETECTED Final   Vibrio  species NOT DETECTED NOT DETECTED Final   Vibrio cholerae NOT DETECTED NOT DETECTED Final   Enteroaggregative E coli (EAEC) NOT DETECTED NOT DETECTED Final   Enteropathogenic E coli (EPEC) NOT DETECTED NOT DETECTED Final   Enterotoxigenic E coli (ETEC) NOT DETECTED NOT DETECTED Final   Shiga like toxin producing E coli (STEC) NOT DETECTED NOT DETECTED Final   Shigella/Enteroinvasive E coli (EIEC) NOT DETECTED NOT DETECTED Final   Cryptosporidium NOT DETECTED NOT DETECTED Final   Cyclospora cayetanensis NOT DETECTED NOT DETECTED Final   Entamoeba histolytica NOT DETECTED NOT DETECTED Final   Giardia lamblia NOT  DETECTED NOT DETECTED Final   Adenovirus F40/41 NOT DETECTED NOT DETECTED Final   Astrovirus NOT DETECTED NOT DETECTED Final   Norovirus GI/GII NOT DETECTED NOT DETECTED Final   Rotavirus A NOT DETECTED NOT DETECTED Final   Sapovirus (I, II, IV, and V) NOT DETECTED NOT DETECTED Final    Comment: Performed at [facility], [address] [place], [place] [address]  Urine Culture     Status: None   Collection Time: 05/28/20  5:51 PM   Specimen: Urine, Random  Result Value Ref Range Status   Specimen Description   Final    URINE, RANDOM Performed at [facility], [address], [place], [place] [address]    Special Requests   Final    NONE Performed at [facility], [address] [place], [place] [address]    Culture   Final    NO GROWTH Performed at [facility], [address] [place], [place] [address]    Report Status 05/30/2020 FINAL  Final  Urine Culture     Status: None   Collection Time: 05/30/20  6:20 AM   Specimen: Urine, Random  Result Value Ref Range Status   Specimen Description   Final    URINE, RANDOM Performed at [facility], [address], [place], [place] [address]    Special Requests   Final    NONE Performed at [facility], [address], [place], [place] [address]    Culture   Final      NO GROWTH Performed at [facility], [address], [place], [place] [address]    Report Status 05/31/2020 FINAL  Final         Radiology Studies: No results found.      Scheduled Meds: . cholecalciferol  1,000 Units Oral Daily  . cholestyramine  4 g Oral TID  . citalopram  40 mg Oral Daily  . enoxaparin (LOVENOX) injection  40 mg Subcutaneous Q24H  . gabapentin  100 mg Oral TID  . HYDROmorphone  1 mg Oral TID  . lidocaine  1 patch Transdermal Q24H  . pantoprazole  40 mg Oral Daily  . polycarbophil  625 mg Oral Daily  . sulfaSALAzine  500 mg Oral QID  . vitamin B-12  1,000 mcg Oral Daily   Continuous Infusions: . remdesivir 100 mg in NS 100 mL 100 mg (06/01/20 1101)     LOS: 4 days    Time spent: 36 minutes spent on chart review, discussion with nursing staff, consultants, updating family and interview/physical exam; more than 50% of that time was spent in counseling and/or coordination of care.    Alvira Philips [place], DO [facility] Available via Epic secure chat 7am-7pm After these hours, please refer to coverage provider listed on [email or web address] 06/01/2020, 2:19 PM

## 2020-06-02 DIAGNOSIS — U071 COVID-19: Secondary | ICD-10-CM | POA: Diagnosis not present

## 2020-06-02 DIAGNOSIS — A0839 Other viral enteritis: Secondary | ICD-10-CM | POA: Diagnosis not present

## 2020-06-02 LAB — COMPREHENSIVE METABOLIC PANEL
ALT: 18 U/L (ref 0–44)
AST: 30 U/L (ref 15–41)
Albumin: 2.9 g/dL — ABNORMAL LOW (ref 3.5–5.0)
Alkaline Phosphatase: 44 U/L (ref 38–126)
Anion gap: 6 (ref 5–15)
BUN: 15 mg/dL (ref 8–23)
CO2: 31 mmol/L (ref 22–32)
Calcium: 8.7 mg/dL — ABNORMAL LOW (ref 8.9–10.3)
Chloride: 105 mmol/L (ref 98–111)
Creatinine, Ser: 0.7 mg/dL (ref 0.44–1.00)
GFR calc Af Amer: >60 mL/min (ref >60)
GFR calc non Af Amer: >60 mL/min (ref >60)
Glucose, Bld: 95 mg/dL (ref 70–99)
Potassium: 3.7 mmol/L (ref 3.5–5.1)
Sodium: 142 mmol/L (ref 135–145)
Total Bilirubin: 0.7 mg/dL (ref 0.3–1.2)
Total Protein: 5.8 g/dL — ABNORMAL LOW (ref 6.5–8.1)

## 2020-06-02 LAB — C-REACTIVE PROTEIN: CRP: 0.7 mg/dL (ref <1.0)

## 2020-06-02 MED ORDER — SODIUM CHLORIDE 0.9 % IV BOLUS
500.0000 mL | Freq: Once | INTRAVENOUS | Status: AC
  Administered 2020-06-02: 20:00:00 500 mL via INTRAVENOUS

## 2020-06-02 NOTE — Progress Notes (Signed)
PROGRESS NOTE    [name]  ZOX:[id number] DOB: 07-Jul-1936 DOA: 05/27/2020 PCP: Doreene Nest, NP    Brief Narrative:  [name] is an 84 year old female with past medical history notable for pulmonary fibrosis with nocturnal hypoxia on 2 L nasal cannula qHS, essential hypertension who presented to the ED with progressive weakness and diarrhea.  Patient recently found to be positive for Covid-19 outpatient with resultant diarrhea.  Patient also with associated poor oral intake.  Patient has previously see if the Pfizer Covid-19 vaccination on October 27, 2019 and November 17, 2019.   In the ED, patient was afebrile, oxygenating well on room air.  WBC within normal limits.  C. difficile and GI PCR panel negative.  Creatinine 0.79.  Patient referred for admission due to inability to tolerate oral intake with associated profuse diarrhea.     Assessment & Plan:   Principal Problem:   Gastroenteritis due to COVID-19 virus Active Problems:   HTN (hypertension)   Postinflammatory pulmonary fibrosis (HCC)   COVID-19 virus infection   Acute viral gastroenteritis secondary to Covid-19 viral infection Patient presenting with profuse diarrhea associated with poor oral intake and nausea.  Unable to tolerate much oral intake at home.  Covid-19 PCR positive on 05/27/2020.  Currently oxygenating well on room air. C. difficile/GI PCR panel negative. --Remdesivir Day #4/5 --Continue Imodium 2 mg prn --Continue to monitor bowel movements closely --Supportive care, antiemetics --Encourage increased oral intake  History of pulmonary fibrosis --Continue nocturnal supplemental oxygen at 2 L per nasal cannula prn  Chronic low back pain --Dilaudid 1 mg PO TID --Lidocaine patch --Voltaren topical gel --Gabapentin 100mg  PO TID  Weakness/deconditioning/debility: --PT recommends home health on discharge, continue PT efforts while inpatient  Depression/anxiety: Continue Celexa 40 mg  p.o. daily   DVT prophylaxis: Lovenox Code Status: Full code Family Communication: Updated patient extensively at bedside  Disposition Plan:  Status is: Inpatient  Remains inpatient appropriate because:Ongoing active pain requiring inpatient pain management, Unsafe d/c plan, IV treatments appropriate due to intensity of illness or inability to take PO and Inpatient level of care appropriate due to severity of illness   Dispo: The patient is from: Home              Anticipated d/c is to: Home              Anticipated d/c date is: 1 day              Patient currently is not medically stable to d/c.    Consultants:   none  Procedures:   None  Antimicrobials:   None   Subjective: Patient seen and examined bedside, resting comfortably.  Spirits this morning.  Appetite improving.  Diarrhea also improved.  Ready for discharge home tomorrow.  No other concerns or questions at this time. Denies headache, no visual changes, no chest pain, palpitations, no abdominal pain, no shortness of breath, no fever/chills/night sweats.  No acute events overnight per nursing staff.  Objective: Vitals:   06/02/20 0059 06/02/20 0436 06/02/20 0806 06/02/20 1300  BP: (!) 113/49 (!) 119/57 (!) 107/57   Pulse: (!) 48 (!) 52 (!) 59 68  Resp: 16  16 (!) 22  Temp: 98.1 F (36.7 C) 98 F (36.7 C) 97.7 F (36.5 C)   TempSrc: Oral Oral Oral   SpO2: 98% 98% 98% 92%  Weight:      Height:        Intake/Output Summary (Last 24 hours) at  06/02/2020 1355 Last data filed at 06/02/2020 0400 Gross per 24 hour  Intake 200 ml  Output --  Net 200 ml   Filed Weights   05/27/20 1224  Weight: 57.8 kg    Examination:  General exam: Appears calm and comfortable  Respiratory system: Clear to auscultation. Respiratory effort normal.  Oxygenating well on room air Cardiovascular system: S1 & S2 heard, RRR. No JVD, murmurs, rubs, gallops or clicks. No pedal edema. Gastrointestinal system: Abdomen is  nondistended, soft and nontender. No organomegaly or masses felt. Normal bowel sounds heard. Central nervous system: Alert and oriented. No focal neurological deficits. Extremities: Symmetric 5 x 5 power. Skin: No rashes, lesions or ulcers Psychiatry: Judgement and insight appear normal. Mood & affect appropriate.     Data Reviewed: I have personally reviewed following labs and imaging studies  CBC: Recent Labs  Lab 05/28/20 0432 05/29/20 0502 05/30/20 0553 05/31/20 0609 06/01/20 0352  WBC 5.3 4.1 5.8 5.1 6.6  NEUTROABS 3.3 2.3 3.8 2.9 4.0  HGB 14.9 12.4 13.1 13.8 13.4  HCT 45.3 38.0 38.3 39.9 37.5  MCV 95.2 95.5 92.1 91.3 87.0  PLT 200 164 145* 167 176   Basic Metabolic Panel: Recent Labs  Lab 05/28/20 0432 05/28/20 0432 05/29/20 0502 05/30/20 0553 05/31/20 0609 06/01/20 0352 06/02/20 0450  NA 142   < > 140 140 140 142 142  K 3.7   < > 4.3 4.2 3.8 4.3 3.7  CL 109   < > 105 103 103 104 105  CO2 22   < > 26 26 27 29 31   GLUCOSE 75   < > 73 87 85 88 95  BUN 23   < > 18 10 11 13 15   CREATININE 0.87   < > 0.80 0.76 0.72 0.80 0.70  CALCIUM 8.6*   < > 8.0* 8.2* 8.5* 8.4* 8.7*  MG 2.1  --  2.0  --   --  2.0  --   PHOS 3.3  --   --   --   --   --   --    < > = values in this interval not displayed.   GFR: Estimated Creatinine Clearance: 43.1 mL/min (by C-G formula based on SCr of 0.7 mg/dL). Liver Function Tests: Recent Labs  Lab 05/29/20 0502 05/30/20 0553 05/31/20 0609 06/01/20 0352 06/02/20 0450  AST 31 32 37 35 30  ALT 17 17 21 20 18   ALKPHOS 46 48 49 43 44  BILITOT 0.8 0.9 0.7 0.8 0.7  PROT 5.3* 5.5* 6.3* 5.4* 5.8*  ALBUMIN 2.8* 3.1* 3.1* 3.0* 2.9*   No results for input(s): LIPASE, AMYLASE in the last 168 hours. No results for input(s): AMMONIA in the last 168 hours. Coagulation Profile: No results for input(s): INR, PROTIME in the last 168 hours. Cardiac Enzymes: No results for input(s): CKTOTAL, CKMB, CKMBINDEX, TROPONINI in the last 168  hours. BNP (last 3 results) No results for input(s): PROBNP in the last 8760 hours. HbA1C: No results for input(s): HGBA1C in the last 72 hours. CBG: No results for input(s): GLUCAP in the last 168 hours. Lipid Profile: No results for input(s): CHOL, HDL, LDLCALC, TRIG, CHOLHDL, LDLDIRECT in the last 72 hours. Thyroid Function Tests: No results for input(s): TSH, T4TOTAL, FREET4, T3FREE, THYROIDAB in the last 72 hours. Anemia Panel: No results for input(s): VITAMINB12, FOLATE, FERRITIN, TIBC, IRON, RETICCTPCT in the last 72 hours. Sepsis Labs: No results for input(s): PROCALCITON, LATICACIDVEN in the last 168 hours.  Recent Results (  from the past 240 hour(s))  Respiratory Panel by RT PCR (Flu A&B, Covid) - Nasopharyngeal Swab     Status: Abnormal   Collection Time: 05/27/20 12:41 PM   Specimen: Nasopharyngeal Swab  Result Value Ref Range Status   SARS Coronavirus 2 by RT PCR POSITIVE (A) NEGATIVE Final    Comment: RESULT CALLED TO, READ BACK BY AND VERIFIED WITH: Martie Round RN AT 1417 ON 05/27/20 SNG (NOTE) SARS-CoV-2 target nucleic acids are DETECTED.  SARS-CoV-2 RNA is generally detectable in upper respiratory specimens  during the acute phase of infection. Positive results are indicative of the presence of the identified virus, but do not rule out bacterial infection or co-infection with other pathogens not detected by the test. Clinical correlation with patient history and other diagnostic information is necessary to determine patient infection status. The expected result is Negative.  Fact Sheet for Patients:  https://www.moore.com/  Fact Sheet for Healthcare Providers: https://www.young.biz/  This test is not yet approved or cleared by the [place] FDA and  has been authorized for detection and/or diagnosis of SARS-CoV-2 by FDA under an Emergency Use Authorization (EUA).  This EUA will remain in effect (meaning this test  can  be used) for the duration of  the COVID-19 declaration under Section 564(b)(1) of the Act, 21 U.S.C. section 360bbb-3(b)(1), unless the authorization is terminated or revoked sooner.      Influenza A by PCR NEGATIVE NEGATIVE Final   Influenza B by PCR NEGATIVE NEGATIVE Final    Comment: (NOTE) The Xpert Xpress SARS-CoV-2/FLU/RSV assay is intended as an aid in  the diagnosis of influenza from Nasopharyngeal swab specimens and  should not be used as a sole basis for treatment. Nasal washings and  aspirates are unacceptable for Xpert Xpress SARS-CoV-2/FLU/RSV  testing.  Fact Sheet for Patients: https://www.moore.com/  Fact Sheet for Healthcare Providers: https://www.young.biz/  This test is not yet approved or cleared by the [place] FDA and  has been authorized for detection and/or diagnosis of SARS-CoV-2 by  FDA under an Emergency Use Authorization (EUA). This EUA will remain  in effect (meaning this test can be used) for the duration of the  Covid-19 declaration under Section 564(b)(1) of the Act, 21  U.S.C. section 360bbb-3(b)(1), unless the authorization is  terminated or revoked. Performed at [facility], [address] [place], [place] [address]   C Difficile Quick Screen w PCR reflex     Status: None   Collection Time: 05/27/20  1:53 PM   Specimen: STOOL  Result Value Ref Range Status   C Diff antigen NEGATIVE NEGATIVE Final   C Diff toxin NEGATIVE NEGATIVE Final   C Diff interpretation No C. difficile detected.  Final    Comment: Performed at [facility], [address] [place], [place] [address]  Gastrointestinal Panel by PCR , Stool     Status: None   Collection Time: 05/27/20  1:53 PM   Specimen: STOOL  Result Value Ref Range Status   Campylobacter species NOT DETECTED NOT DETECTED Final   Plesimonas shigelloides NOT DETECTED NOT DETECTED Final   Salmonella species NOT DETECTED NOT DETECTED  Final   Yersinia enterocolitica NOT DETECTED NOT DETECTED Final   Vibrio species NOT DETECTED NOT DETECTED Final   Vibrio cholerae NOT DETECTED NOT DETECTED Final   Enteroaggregative E coli (EAEC) NOT DETECTED NOT DETECTED Final   Enteropathogenic E coli (EPEC) NOT DETECTED NOT DETECTED Final   Enterotoxigenic E coli (ETEC) NOT DETECTED NOT DETECTED Final  Shiga like toxin producing E coli (STEC) NOT DETECTED NOT DETECTED Final   Shigella/Enteroinvasive E coli (EIEC) NOT DETECTED NOT DETECTED Final   Cryptosporidium NOT DETECTED NOT DETECTED Final   Cyclospora cayetanensis NOT DETECTED NOT DETECTED Final   Entamoeba histolytica NOT DETECTED NOT DETECTED Final   Giardia lamblia NOT DETECTED NOT DETECTED Final   Adenovirus F40/41 NOT DETECTED NOT DETECTED Final   Astrovirus NOT DETECTED NOT DETECTED Final   Norovirus GI/GII NOT DETECTED NOT DETECTED Final   Rotavirus A NOT DETECTED NOT DETECTED Final   Sapovirus (I, II, IV, and V) NOT DETECTED NOT DETECTED Final    Comment: Performed at [facility], [address], [place], [place] [address]  Urine Culture     Status: None   Collection Time: 05/28/20  5:51 PM   Specimen: Urine, Random  Result Value Ref Range Status   Specimen Description   Final    URINE, RANDOM Performed at [facility], [address], [place], [place] [address]    Special Requests   Final    NONE Performed at [facility], [address] [place], [place] [address]    Culture   Final    NO GROWTH Performed at [facility], [address] [place], [place] [address]    Report Status 05/30/2020 FINAL  Final  Urine Culture     Status: None   Collection Time: 05/30/20  6:20 AM   Specimen: Urine, Random  Result Value Ref Range Status   Specimen Description   Final    URINE, RANDOM Performed at [facility], [address], [place], [place] [address]    Special Requests   Final    NONE Performed at [facility], [address] [place], [place] [address]    Culture   Final    NO GROWTH Performed at [facility], [address], [place], [place] [address]    Report Status 05/31/2020 FINAL  Final         Radiology Studies: No results found.      Scheduled Meds: . cholecalciferol  1,000 Units Oral Daily  . cholestyramine  4 g Oral TID  . citalopram  40 mg Oral Daily  . enoxaparin (LOVENOX) injection  40 mg Subcutaneous Q24H  . gabapentin  100 mg Oral TID  . HYDROmorphone  1 mg Oral TID  . lidocaine  1 patch Transdermal Q24H  . pantoprazole  40 mg Oral Daily  . sulfaSALAzine  500 mg Oral QID  . vitamin B-12  1,000 mcg Oral Daily   Continuous Infusions: . remdesivir 100 mg in NS 100 mL 100 mg (06/02/20 1053)     LOS: 5 days    Time spent: 36 minutes spent on chart review, discussion with nursing staff, consultants, updating family and interview/physical exam; more than 50% of that time was spent in counseling and/or coordination of care.    Alvira Philips [place], DO [facility] Available via Epic secure chat 7am-7pm After these hours, please refer to coverage provider listed on [email or web address] 06/02/2020, 1:55 PM

## 2020-06-02 NOTE — Care Management Important Message (Signed)
Important Message  Patient Details  Name: ANJELI CASAD MRN: [id number] Date of Birth: [birth year]   Medicare Important Message Given:  Yes  Reviewed with patient via room phone due to isolation status.  Declined copy of Medicare IM as one being mailed to home address by Patient Access staff earlier this week.   Dannette Barbara 06/02/2020, 1:45 PM

## 2020-06-02 NOTE — Progress Notes (Signed)
Physical Therapy Treatment Patient Details Name: [name] MRN: [id number] DOB: 12-13-1935 Today's Date: 06/02/2020    History of Present Illness [name] is a 84 year old female with PMH of pulmonary fibrosis with nocturnal hypoxia requiring 2L and hypertension. She is admitted with concerns of persistent weakness and diarrhea after testing positive for COVID.     PT Comments    Pt received lying in bed and excited to participate in PT session. Pt was mod I for bed mobility for increased time and use of bedrail while HOB was elevated. Pt stood with supervision for safety and ambulated 150 feet using RW with CGA for steadying and safety. Pt with decreased gait speed with shortened step lengths bilaterally. Pt limited secondary to fatigue. Pt returned to room to perform therex seated in recliner chair for promotion of muscle strengthening, soft tissue stretching, and used IS for improved lung expansion on inhalation. Pt required verbal cues for improved safety with RW usage and for hand placement during transfers. Pt remained on room air throughout session and desat to 83% during ambulation and after pursed lip breathing increased to 86% then 93% SpO2 within 30 seconds. Pt making good progress with guided PT sessions. Will continue to follow and progress as tolerated. Recommend RW for discharge for energy conservation and steadying with upright mobility with skis for back legs to use on carpeted floors in home.   Follow Up Recommendations  Home health PT     Equipment Recommendations  Rolling walker with 5" wheels;Other (comment) (pt will need skis for back legs of RW for carpet use)    Recommendations for Other Services       Precautions / Restrictions Precautions Precautions: Fall Restrictions Weight Bearing Restrictions: No    Mobility  Bed Mobility Overal bed mobility: Modified Independent Bed Mobility: Supine to Sit     Supine to sit: Modified independent  (Device/Increase time);HOB elevated     General bed mobility comments: Pt required increased time and use of bedrails to come into sitting edge of bed  Transfers Overall transfer level: Needs assistance Equipment used: Rolling walker (2 wheeled) Transfers: Sit to/from Stand Sit to Stand: Supervision         General transfer comment: Supervision for sit <> stand transfers for safety; verbal cues for hand placement  Ambulation/Gait Ambulation/Gait assistance: Min guard Gait Distance (Feet): 150 Feet Assistive device: Rolling walker (2 wheeled) Gait Pattern/deviations: Step-through pattern;Decreased step length - right;Decreased step length - left Gait velocity: decreased   General Gait Details: Pt ambulated 150 feet using RW with CGA for steadying and safety; verbal cues for safe proximity to RW as pt too far away   Stairs             Wheelchair Mobility    Modified Rankin (Stroke Patients Only)       Balance Overall balance assessment: Needs assistance Sitting-balance support: Single extremity supported;Feet supported Sitting balance-Leahy Scale: Good Sitting balance - Comments: no overt LOB noted in sitting with back unsupported   Standing balance support: Bilateral upper extremity supported Standing balance-Leahy Scale: Fair Standing balance comment: requires B UE support with RW for stability with CGA for safety                            Cognition Arousal/Alertness: Awake/alert Behavior During Therapy: WFL for tasks assessed/performed Overall Cognitive Status: Within Functional Limits for tasks assessed  Exercises Other Exercises Other Exercises: pt performed seated marches, horizontal abduction, scap retractions, and LAQ x 10 bilaterally Other Exercises: IS x 10 with highest setting reaching highest setting of 625 mL    General Comments        Pertinent Vitals/Pain Pain  Assessment: No/denies pain    Home Living                      Prior Function            PT Goals (current goals can now be found in the care plan section) Acute Rehab PT Goals Patient Stated Goal: "to go home" PT Goal Formulation: With patient Time For Goal Achievement: 06/14/20 Potential to Achieve Goals: Good Progress towards PT goals: Progressing toward goals    Frequency    Min 2X/week      PT Plan Current plan remains appropriate    Co-evaluation              AM-PAC PT "6 Clicks" Mobility   Outcome Measure  Help needed turning from your back to your side while in a flat bed without using bedrails?: A Little Help needed moving from lying on your back to sitting on the side of a flat bed without using bedrails?: A Little Help needed moving to and from a bed to a chair (including a wheelchair)?: A Little Help needed standing up from a chair using your arms (e.g., wheelchair or bedside chair)?: A Little Help needed to walk in hospital room?: A Little Help needed climbing 3-5 steps with a railing? : A Little 6 Click Score: 18    End of Session Equipment Utilized During Treatment: Gait belt Activity Tolerance: Patient limited by fatigue Patient left: in chair;with call bell/phone within reach;with chair alarm set Nurse Communication: Mobility status PT Visit Diagnosis: Unsteadiness on feet (R26.81);Other abnormalities of gait and mobility (R26.89);Muscle weakness (generalized) (M62.81);Difficulty in walking, not elsewhere classified (R26.2)     Time: 3474-2595 PT Time Calculation (min) (ACUTE ONLY): 26 min  Charges:                        Vale Haven, SPT   Vale Haven 06/02/2020, 5:18 PM

## 2020-06-03 DIAGNOSIS — U071 COVID-19: Secondary | ICD-10-CM | POA: Diagnosis not present

## 2020-06-03 DIAGNOSIS — A0839 Other viral enteritis: Secondary | ICD-10-CM | POA: Diagnosis not present

## 2020-06-03 NOTE — Discharge Summary (Signed)
Physician Discharge Summary  [name] ZDG:[id number] DOB: 19-Mar-1936 DOA: 05/27/2020  PCP: Doreene Nest, NP  Admit date: 05/27/2020 Discharge date: 06/03/2020  Admitted From: Home Disposition: Home  Recommendations for Outpatient Follow-up:  1. Follow up with PCP in 1-2 weeks 2. Continue home isolation for 14 days from initial diagnosis of Covid-19 3. May continue Imodium as needed for diarrhea related to viral gastroenteritis from Covid-19 viral infection 4. Recommend booster immunization in 45 days  Home Health: PT/OT Equipment/Devices: Tub bench, rolling walker  Discharge Condition: Stable CODE STATUS: Full code Diet recommendation: Heart healthy diet  History of present illness:  [name] is an 84 year old female with past medical history notable for pulmonary fibrosis with nocturnal hypoxia on 2 L nasal cannula qHS, essential hypertension who presented to the ED with progressive weakness and diarrhea.  Patient recently found to be positive for Covid-19 outpatient with resultant diarrhea.  Patient also with associated poor oral intake.  Patient has previously see if the Pfizer Covid-19 vaccination on October 27, 2019 and November 17, 2019.   In the ED, patient was afebrile, oxygenating well on room air.  WBC within normal limits.  C. difficile and GI PCR panel negative.  Creatinine 0.79.  Patient referred for admission due to inability to tolerate oral intake with associated profuse diarrhea.    Hospital course:  Acute viral gastroenteritis secondary to Covid-19 viral infection Patient presenting with profuse diarrhea associated with poor oral intake and nausea.  Unable to tolerate much oral intake at home.  Covid-19 PCR positive on 05/27/2020.  Currently oxygenating well on room air. C. difficile/GI PCR panel negative.  Patient completed 5-day course of remdesivir.  Patient was initially supported with cholestyramine 3 times daily with resolution of diarrhea.   May continue Imodium as needed.  Patient tolerating oral intake well without any symptoms of nausea or vomiting.  Outpatient follow-up with PCP.  Recommend booster vaccination in 45 days.  History of pulmonary fibrosis Continue nocturnal supplemental oxygen at 2 L per nasal cannula prn   Weakness/deconditioning/debility: PT/OT recommends home health on discharge, with tub bench and walker  Depression/anxiety: Continue Celexa 40 mg p.o. daily  Discharge Diagnoses:  Active Problems:   HTN (hypertension)   Postinflammatory pulmonary fibrosis (HCC)   COVID-19 virus infection    Discharge Instructions  Discharge Instructions    Call MD for:  difficulty breathing, headache or visual disturbances   Complete by: As directed    Call MD for:  extreme fatigue   Complete by: As directed    Call MD for:  persistant dizziness or light-headedness   Complete by: As directed    Call MD for:  persistant nausea and vomiting   Complete by: As directed    Call MD for:  severe uncontrolled pain   Complete by: As directed    Call MD for:  temperature >100.4   Complete by: As directed    Diet - low sodium heart healthy   Complete by: As directed    Increase activity slowly   Complete by: As directed      Allergies as of 06/03/2020   No Known Allergies     Medication List    STOP taking these medications   doxycycline 100 MG tablet Commonly known as: VIBRA-TABS     TAKE these medications   Calcium Polycarbophil 625 MG Chew Chew 1 tablet by mouth daily.   Cholecalciferol 25 MCG (1000 UT) capsule Take 1,000 Units by mouth daily.  citalopram 40 MG tablet Commonly known as: CELEXA Take 1 tablet (40 mg total) by mouth daily. For anxiety.   denosumab 60 MG/ML Sosy injection Commonly known as: PROLIA Inject 60 mg into the skin every 6 (six) months.   Esbriet 267 MG Tabs Generic drug: Pirfenidone Take 6 tablets by mouth daily.   HYDROmorphone 4 MG tablet Commonly known as:  DILAUDID Take 4 mg by mouth 3 (three) times daily.   methylPREDNISolone 4 MG Tbpk tablet Commonly known as: MEDROL DOSEPAK Take by mouth.   sulfaSALAzine 500 MG tablet Commonly known as: AZULFIDINE Take 500 mg by mouth 4 (four) times daily.   vitamin B-12 1000 MCG tablet Commonly known as: CYANOCOBALAMIN Take 1,000 mcg by mouth daily.   Voltaren 1 % Gel Generic drug: diclofenac Sodium            Durable Medical Equipment  (From admission, onward)         Start     Ordered   06/03/20 0656  For home use only DME Walker rolling  Once       Comments: Skis for back legs for carpet use  Question Answer Comment  Walker: With 5 Inch Wheels   Patient needs a walker to treat with the following condition Gait disorder      06/03/20 0655   06/01/20 0712  For home use only DME Tub bench  Once        06/01/20 5409          Follow-up Information    Doreene Nest, NP. Schedule an appointment as soon as possible for a visit in 1 week(s).   Specialty: Internal Medicine Contact information: [address] [place] [address] [phone]              No Known Allergies  Consultations:  None   Procedures/Studies: CT HEAD WO CONTRAST  Result Date: 05/29/2020 CLINICAL DATA:  Headache.  COVID-19 positive. EXAM: CT HEAD WITHOUT CONTRAST TECHNIQUE: Contiguous axial images were obtained from the base of the skull through the vertex without intravenous contrast. COMPARISON:  02/08/2014 FINDINGS: Brain: No evidence of acute infarction, hemorrhage, hydrocephalus, extra-axial collection or mass lesion/mass effect. Mild periventricular white matter hypodensity bilaterally appears chronic. Vascular: Negative for hyperdense vessel Skull: Negative Sinuses/Orbits: Mild mucosal edema paranasal sinuses. Bilateral cataract extraction. Other: None IMPRESSION: No acute abnormality. Mild white matter changes consistent with chronic microvascular ischemia. Mild sinus mucosal edema.  Electronically Signed   By: Marlan Palau M.D.   On: 05/29/2020 18:13   DG Chest Port 1 View  Result Date: 05/27/2020 CLINICAL DATA:  84 year old female with weakness. EXAM: PORTABLE CHEST 1 VIEW COMPARISON:  10/07/2018 FINDINGS: The heart size and mediastinal contours are unchanged. Low lung volumes. Unchanged elevation left hemidiaphragm. Slightly increased prominence of the pulmonary vasculature and increased streaky and reticular opacities in the bilateral lung bases, left greater than right. No pleural effusion or pneumothorax. Status post left reverse shoulder arthroplasty. Similar appearing changes after multilevel lumbar vertebral body cement augmentation. IMPRESSION: Similar appearing changes associated with known interstitial lung disease. No acute cardiopulmonary process. Electronically Signed   By: Marliss Coots MD   On: 05/27/2020 13:53   ECHOCARDIOGRAM COMPLETE  Result Date: 05/29/2020    ECHOCARDIOGRAM REPORT   Patient Name:   LANEA SPRUIELL Date of Exam: 05/29/2020 Medical Rec #:  [id number]         Height:       60.0 in Accession #:  [id number]        Weight:       127.4 lb Date of Birth:  04/27/1936         BSA:          1.541 m Patient Age:    84 years          BP:           117/46 mmHg Patient Gender: F                 HR:           50 bpm. Exam Location:  [facility] Procedure: 2D Echo, Cardiac Doppler and Color Doppler Indications:     Bradycardia  History:         Patient has no prior history of Echocardiogram examinations.                  Signs/Symptoms:Shortness of Breath; Risk Factors:Hypertension.                  Pneumonia.  Sonographer:     Cristela Blue RDCS (AE) Referring Phys:  [id number] Gertha Calkin Diagnosing Phys: Arnoldo Hooker MD  Sonographer Comments: No parasternal window, no subcostal window and Technically challenging study due to limited acoustic windows. IMPRESSIONS  1. Left ventricular ejection fraction, by estimation, is 60 to 65%. The left ventricle has normal function.  The left ventricle has no regional wall motion abnormalities. Left ventricular diastolic parameters were normal.  2. Right ventricular systolic function is normal. The right ventricular size is normal.  3. Left atrial size was mildly dilated.  4. The mitral valve is normal in structure. Mild mitral valve regurgitation.  5. The aortic valve is normal in structure. Aortic valve regurgitation is not visualized. FINDINGS  Left Ventricle: Left ventricular ejection fraction, by estimation, is 60 to 65%. The left ventricle has normal function. The left ventricle has no regional wall motion abnormalities. The left ventricular internal cavity size was normal in size. There is  no left ventricular hypertrophy. Left ventricular diastolic parameters were normal. Right Ventricle: The right ventricular size is normal. No increase in right ventricular wall thickness. Right ventricular systolic function is normal. Left Atrium: Left atrial size was mildly dilated. Right Atrium: Right atrial size was normal in size. Pericardium: There is no evidence of pericardial effusion. Mitral Valve: The mitral valve is normal in structure. Mild mitral valve regurgitation. Tricuspid Valve: The tricuspid valve is normal in structure. Tricuspid valve regurgitation is trivial. Aortic Valve: The aortic valve is normal in structure. Aortic valve regurgitation is not visualized. Aortic valve mean gradient measures 5.0 mmHg. Aortic valve peak gradient measures 10.6 mmHg. Aortic valve area, by VTI measures 1.77 cm. Pulmonic Valve: The pulmonic valve was normal in structure. Pulmonic valve regurgitation is not visualized. Aorta: The aortic root and ascending aorta are structurally normal, with no evidence of dilitation. IAS/Shunts: No atrial level shunt detected by color flow Doppler.  LEFT VENTRICLE PLAX 2D LVIDd:         2.99 cm  Diastology LVIDs:         2.16 cm  LV e' medial:    6.09 cm/s LV PW:         1.17 cm  LV E/e' medial:  11.9 LV IVS:         1.49 cm  LV e' lateral:   9.25 cm/s LVOT diam:     2.00 cm  LV E/e' lateral: 7.8 LV SV:  60 LV SV Index:   39 LVOT Area:     3.14 cm  RIGHT VENTRICLE RV S prime:     19.60 cm/s TAPSE (M-mode): 3.0 cm LEFT ATRIUM             Index       RIGHT ATRIUM           Index LA diam:        3.70 cm 2.40 cm/m  RA Area:     10.60 cm LA Vol (A2C):   92.0 ml 59.70 ml/m RA Volume:   19.60 ml  12.72 ml/m LA Vol (A4C):   65.5 ml 42.50 ml/m LA Biplane Vol: 79.3 ml 51.45 ml/m  AORTIC VALVE AV Area (Vmax):    1.89 cm AV Area (Vmean):   1.67 cm AV Area (VTI):     1.77 cm AV Vmax:           162.50 cm/s AV Vmean:          106.600 cm/s AV VTI:            0.337 m AV Peak Grad:      10.6 mmHg AV Mean Grad:      5.0 mmHg LVOT Vmax:         97.90 cm/s LVOT Vmean:        56.800 cm/s LVOT VTI:          0.190 m LVOT/AV VTI ratio: 0.56  AORTA Ao Root diam: 2.40 cm MITRAL VALVE               TRICUSPID VALVE MV Area (PHT): 3.27 cm    TR Peak grad:   13.1 mmHg MV Decel Time: 232 msec    TR Vmax:        181.00 cm/s MV E velocity: 72.40 cm/s MV A velocity: 62.60 cm/s  SHUNTS MV E/A ratio:  1.16        Systemic VTI:  0.19 m                            Systemic Diam: 2.00 cm Arnoldo Hooker MD Electronically signed by Arnoldo Hooker MD Signature Date/Time: 05/29/2020/1:05:17 PM    Final       Subjective: Patient seen and examined bedside, resting comfortably.  Ready for discharge home today.  States diarrhea has resolved.  Tolerating oral intake without any associated nausea or vomiting.  No other complaints or concerns at this time.  Completing remdesivir infusion today.  Denies headache, no dizziness, no chest pain, no palpitations, no shortness of breath, no abdominal pain, no weakness, no fatigue, no fever/chills/night sweats, no paresthesias.  No acute events overnight per nursing staff.  Discharge Exam: Vitals:   06/03/20 0529 06/03/20 0805  BP: (!) 123/53 (!) 124/57  Pulse: (!) 56 64  Resp: 15 17  Temp: 97.7 F (36.5  C) 98.5 F (36.9 C)  SpO2: 98% 99%   Vitals:   06/02/20 2234 06/03/20 0205 06/03/20 0529 06/03/20 0805  BP: (!) 117/40 (!) 117/50 (!) 123/53 (!) 124/57  Pulse: (!) 55 62 (!) 56 64  Resp: 16 18 15 17   Temp: 98 F (36.7 C) 98.5 F (36.9 C) 97.7 F (36.5 C) 98.5 F (36.9 C)  TempSrc: Oral Oral Oral Oral  SpO2: 97% 92% 98% 99%  Weight:      Height:        General: Pt is alert, awake, not in acute distress  Cardiovascular: RRR, S1/S2 +, no rubs, no gallops Respiratory: CTA bilaterally, no wheezing, no rhonchi Abdominal: Soft, NT, ND, bowel sounds + Extremities: no edema, no cyanosis    The results of significant diagnostics from this hospitalization (including imaging, microbiology, ancillary and laboratory) are listed below for reference.     Microbiology: Recent Results (from the past 240 hour(s))  Respiratory Panel by RT PCR (Flu A&B, Covid) - Nasopharyngeal Swab     Status: Abnormal   Collection Time: 05/27/20 12:41 PM   Specimen: Nasopharyngeal Swab  Result Value Ref Range Status   SARS Coronavirus 2 by RT PCR POSITIVE (A) NEGATIVE Final    Comment: RESULT CALLED TO, READ BACK BY AND VERIFIED WITH: Martie Round RN AT 1417 ON 05/27/20 SNG (NOTE) SARS-CoV-2 target nucleic acids are DETECTED.  SARS-CoV-2 RNA is generally detectable in upper respiratory specimens  during the acute phase of infection. Positive results are indicative of the presence of the identified virus, but do not rule out bacterial infection or co-infection with other pathogens not detected by the test. Clinical correlation with patient history and other diagnostic information is necessary to determine patient infection status. The expected result is Negative.  Fact Sheet for Patients:  https://www.moore.com/  Fact Sheet for Healthcare Providers: https://www.young.biz/  This test is not yet approved or cleared by the [place] FDA and  has been  authorized for detection and/or diagnosis of SARS-CoV-2 by FDA under an Emergency Use Authorization (EUA).  This EUA will remain in effect (meaning this test can  be used) for the duration of  the COVID-19 declaration under Section 564(b)(1) of the Act, 21 U.S.C. section 360bbb-3(b)(1), unless the authorization is terminated or revoked sooner.      Influenza A by PCR NEGATIVE NEGATIVE Final   Influenza B by PCR NEGATIVE NEGATIVE Final    Comment: (NOTE) The Xpert Xpress SARS-CoV-2/FLU/RSV assay is intended as an aid in  the diagnosis of influenza from Nasopharyngeal swab specimens and  should not be used as a sole basis for treatment. Nasal washings and  aspirates are unacceptable for Xpert Xpress SARS-CoV-2/FLU/RSV  testing.  Fact Sheet for Patients: https://www.moore.com/  Fact Sheet for Healthcare Providers: https://www.young.biz/  This test is not yet approved or cleared by the [place] FDA and  has been authorized for detection and/or diagnosis of SARS-CoV-2 by  FDA under an Emergency Use Authorization (EUA). This EUA will remain  in effect (meaning this test can be used) for the duration of the  Covid-19 declaration under Section 564(b)(1) of the Act, 21  U.S.C. section 360bbb-3(b)(1), unless the authorization is  terminated or revoked. Performed at [facility], [address] [place], [place] [address]   C Difficile Quick Screen w PCR reflex     Status: None   Collection Time: 05/27/20  1:53 PM   Specimen: STOOL  Result Value Ref Range Status   C Diff antigen NEGATIVE NEGATIVE Final   C Diff toxin NEGATIVE NEGATIVE Final   C Diff interpretation No C. difficile detected.  Final    Comment: Performed at [facility], [address] [place], [place] [address]  Gastrointestinal Panel by PCR , Stool     Status: None   Collection Time: 05/27/20  1:53 PM   Specimen: STOOL  Result Value Ref Range Status    Campylobacter species NOT DETECTED NOT DETECTED Final   Plesimonas shigelloides NOT DETECTED NOT DETECTED Final   Salmonella species NOT DETECTED NOT DETECTED Final   Yersinia enterocolitica  NOT DETECTED NOT DETECTED Final   Vibrio species NOT DETECTED NOT DETECTED Final   Vibrio cholerae NOT DETECTED NOT DETECTED Final   Enteroaggregative E coli (EAEC) NOT DETECTED NOT DETECTED Final   Enteropathogenic E coli (EPEC) NOT DETECTED NOT DETECTED Final   Enterotoxigenic E coli (ETEC) NOT DETECTED NOT DETECTED Final   Shiga like toxin producing E coli (STEC) NOT DETECTED NOT DETECTED Final   Shigella/Enteroinvasive E coli (EIEC) NOT DETECTED NOT DETECTED Final   Cryptosporidium NOT DETECTED NOT DETECTED Final   Cyclospora cayetanensis NOT DETECTED NOT DETECTED Final   Entamoeba histolytica NOT DETECTED NOT DETECTED Final   Giardia lamblia NOT DETECTED NOT DETECTED Final   Adenovirus F40/41 NOT DETECTED NOT DETECTED Final   Astrovirus NOT DETECTED NOT DETECTED Final   Norovirus GI/GII NOT DETECTED NOT DETECTED Final   Rotavirus A NOT DETECTED NOT DETECTED Final   Sapovirus (I, II, IV, and V) NOT DETECTED NOT DETECTED Final    Comment: Performed at [facility], [address], [place], [place] [address]  Urine Culture     Status: None   Collection Time: 05/28/20  5:51 PM   Specimen: Urine, Random  Result Value Ref Range Status   Specimen Description   Final    URINE, RANDOM Performed at [facility], [address] [place], [place] [address]    Special Requests   Final    NONE Performed at [facility], [address] [place], [place] [address]    Culture   Final    NO GROWTH Performed at [facility], [address] [place], [place] [address]    Report Status 05/30/2020 FINAL  Final  Urine Culture     Status: None   Collection Time: 05/30/20  6:20 AM   Specimen: Urine, Random  Result Value Ref Range Status   Specimen Description   Final     URINE, RANDOM Performed at [facility], [address], [place], [place] [address]    Special Requests   Final    NONE Performed at [facility], [address], [place], [place] [address]    Culture   Final    NO GROWTH Performed at [facility], [address], [place], [place] [address]    Report Status 05/31/2020 FINAL  Final     Labs: BNP (last 3 results) Recent Labs    05/29/20 0502  BNP 69.1   Basic Metabolic Panel: Recent Labs  Lab 05/28/20 0432 05/28/20 0432 05/29/20 0502 05/30/20 0553 05/31/20 0609 06/01/20 0352 06/02/20 0450  NA 142   < > 140 140 140 142 142  K 3.7   < > 4.3 4.2 3.8 4.3 3.7  CL 109   < > 105 103 103 104 105  CO2 22   < > 26 26 27 29 31   GLUCOSE 75   < > 73 87 85 88 95  BUN 23   < > 18 10 11 13 15   CREATININE 0.87   < > 0.80 0.76 0.72 0.80 0.70  CALCIUM 8.6*   < > 8.0* 8.2* 8.5* 8.4* 8.7*  MG 2.1  --  2.0  --   --  2.0  --   PHOS 3.3  --   --   --   --   --   --    < > = values in this interval not displayed.   Liver Function Tests: Recent Labs  Lab 05/29/20 0502 05/30/20  9147 05/31/20 0609 06/01/20 0352 06/02/20 0450  AST 31 32 37 35 30  ALT 17 17 21 20 18   ALKPHOS 46 48 49 43 44  BILITOT 0.8 0.9 0.7 0.8 0.7  PROT 5.3* 5.5* 6.3* 5.4* 5.8*  ALBUMIN 2.8* 3.1* 3.1* 3.0* 2.9*   No results for input(s): LIPASE, AMYLASE in the last 168 hours. No results for input(s): AMMONIA in the last 168 hours. CBC: Recent Labs  Lab 05/28/20 0432 05/29/20 0502 05/30/20 0553 05/31/20 0609 06/01/20 0352  WBC 5.3 4.1 5.8 5.1 6.6  NEUTROABS 3.3 2.3 3.8 2.9 4.0  HGB 14.9 12.4 13.1 13.8 13.4  HCT 45.3 38.0 38.3 39.9 37.5  MCV 95.2 95.5 92.1 91.3 87.0  PLT 200 164 145* 167 176   Cardiac Enzymes: No results for input(s): CKTOTAL, CKMB, CKMBINDEX, TROPONINI in the last 168 hours. BNP: Invalid input(s): POCBNP CBG: No results for input(s): GLUCAP in the last 168 hours. D-Dimer No results for input(s): DDIMER in  the last 72 hours. Hgb A1c No results for input(s): HGBA1C in the last 72 hours. Lipid Profile No results for input(s): CHOL, HDL, LDLCALC, TRIG, CHOLHDL, LDLDIRECT in the last 72 hours. Thyroid function studies No results for input(s): TSH, T4TOTAL, T3FREE, THYROIDAB in the last 72 hours.  Invalid input(s): FREET3 Anemia work up No results for input(s): VITAMINB12, FOLATE, FERRITIN, TIBC, IRON, RETICCTPCT in the last 72 hours. Urinalysis    Component Value Date/Time   COLORURINE AMBER (A) 05/27/2020 1751   APPEARANCEUR CLOUDY (A) 05/27/2020 1751   APPEARANCEUR Clear 02/02/2015 1148   LABSPEC 1.005 05/27/2020 1751   LABSPEC 1.023 02/08/2014 1851   PHURINE 9.0 (H) 05/27/2020 1751   GLUCOSEU NEGATIVE 05/27/2020 1751   GLUCOSEU Negative 02/08/2014 1851   HGBUR LARGE (A) 05/27/2020 1751   BILIRUBINUR NEGATIVE 05/27/2020 1751   BILIRUBINUR neg 02/16/2020 1151   BILIRUBINUR Negative 02/02/2015 1148   BILIRUBINUR Negative 02/08/2014 1851   KETONESUR 5 (A) 05/27/2020 1751   PROTEINUR NEGATIVE 05/27/2020 1751   UROBILINOGEN 0.2 02/16/2020 1151   NITRITE NEGATIVE 05/27/2020 1751   LEUKOCYTESUR MODERATE (A) 05/27/2020 1751   LEUKOCYTESUR 3+ 02/08/2014 1851   Sepsis Labs Invalid input(s): PROCALCITONIN,  WBC,  LACTICIDVEN Microbiology Recent Results (from the past 240 hour(s))  Respiratory Panel by RT PCR (Flu A&B, Covid) - Nasopharyngeal Swab     Status: Abnormal   Collection Time: 05/27/20 12:41 PM   Specimen: Nasopharyngeal Swab  Result Value Ref Range Status   SARS Coronavirus 2 by RT PCR POSITIVE (A) NEGATIVE Final    Comment: RESULT CALLED TO, READ BACK BY AND VERIFIED WITH: Martie Round RN AT 1417 ON 05/27/20 SNG (NOTE) SARS-CoV-2 target nucleic acids are DETECTED.  SARS-CoV-2 RNA is generally detectable in upper respiratory specimens  during the acute phase of infection. Positive results are indicative of the presence of the identified virus, but do not rule  out bacterial infection or co-infection with other pathogens not detected by the test. Clinical correlation with patient history and other diagnostic information is necessary to determine patient infection status. The expected result is Negative.  Fact Sheet for Patients:  https://www.moore.com/  Fact Sheet for Healthcare Providers: https://www.young.biz/  This test is not yet approved or cleared by the [place] FDA and  has been authorized for detection and/or diagnosis of SARS-CoV-2 by FDA under an Emergency Use Authorization (EUA).  This EUA will remain in effect (meaning this test can  be used) for the duration of  the COVID-19 declaration under Section  564(b)(1) of the Act, 21 U.S.C. section 360bbb-3(b)(1), unless the authorization is terminated or revoked sooner.      Influenza A by PCR NEGATIVE NEGATIVE Final   Influenza B by PCR NEGATIVE NEGATIVE Final    Comment: (NOTE) The Xpert Xpress SARS-CoV-2/FLU/RSV assay is intended as an aid in  the diagnosis of influenza from Nasopharyngeal swab specimens and  should not be used as a sole basis for treatment. Nasal washings and  aspirates are unacceptable for Xpert Xpress SARS-CoV-2/FLU/RSV  testing.  Fact Sheet for Patients: https://www.moore.com/  Fact Sheet for Healthcare Providers: https://www.young.biz/  This test is not yet approved or cleared by the [place] FDA and  has been authorized for detection and/or diagnosis of SARS-CoV-2 by  FDA under an Emergency Use Authorization (EUA). This EUA will remain  in effect (meaning this test can be used) for the duration of the  Covid-19 declaration under Section 564(b)(1) of the Act, 21  U.S.C. section 360bbb-3(b)(1), unless the authorization is  terminated or revoked. Performed at [facility], [address] [place], [place] [address]   C Difficile Quick Screen w PCR reflex      Status: None   Collection Time: 05/27/20  1:53 PM   Specimen: STOOL  Result Value Ref Range Status   C Diff antigen NEGATIVE NEGATIVE Final   C Diff toxin NEGATIVE NEGATIVE Final   C Diff interpretation No C. difficile detected.  Final    Comment: Performed at [facility], [address] [place], [place] [address]  Gastrointestinal Panel by PCR , Stool     Status: None   Collection Time: 05/27/20  1:53 PM   Specimen: STOOL  Result Value Ref Range Status   Campylobacter species NOT DETECTED NOT DETECTED Final   Plesimonas shigelloides NOT DETECTED NOT DETECTED Final   Salmonella species NOT DETECTED NOT DETECTED Final   Yersinia enterocolitica NOT DETECTED NOT DETECTED Final   Vibrio species NOT DETECTED NOT DETECTED Final   Vibrio cholerae NOT DETECTED NOT DETECTED Final   Enteroaggregative E coli (EAEC) NOT DETECTED NOT DETECTED Final   Enteropathogenic E coli (EPEC) NOT DETECTED NOT DETECTED Final   Enterotoxigenic E coli (ETEC) NOT DETECTED NOT DETECTED Final   Shiga like toxin producing E coli (STEC) NOT DETECTED NOT DETECTED Final   Shigella/Enteroinvasive E coli (EIEC) NOT DETECTED NOT DETECTED Final   Cryptosporidium NOT DETECTED NOT DETECTED Final   Cyclospora cayetanensis NOT DETECTED NOT DETECTED Final   Entamoeba histolytica NOT DETECTED NOT DETECTED Final   Giardia lamblia NOT DETECTED NOT DETECTED Final   Adenovirus F40/41 NOT DETECTED NOT DETECTED Final   Astrovirus NOT DETECTED NOT DETECTED Final   Norovirus GI/GII NOT DETECTED NOT DETECTED Final   Rotavirus A NOT DETECTED NOT DETECTED Final   Sapovirus (I, II, IV, and V) NOT DETECTED NOT DETECTED Final    Comment: Performed at [facility], [address], [place], [place] [address]  Urine Culture     Status: None   Collection Time: 05/28/20  5:51 PM   Specimen: Urine, Random  Result Value Ref Range Status   Specimen Description   Final    URINE, RANDOM Performed at [facility], [address], [place], [place] [address]    Special Requests   Final    NONE Performed at [facility], [address], [place], [place] [address]    Culture   Final    NO GROWTH Performed at [facility],  [address] [place], [place] [address]    Report Status 05/30/2020 FINAL  Final  Urine Culture     Status: None   Collection Time: 05/30/20  6:20 AM   Specimen: Urine, Random  Result Value Ref Range Status   Specimen Description   Final    URINE, RANDOM Performed at [facility], [address] [place], [place] [address]    Special Requests   Final    NONE Performed at [facility], [address] [place], [place] [address]    Culture   Final    NO GROWTH Performed at [facility], [address] [place], [place] [address]    Report Status 05/31/2020 FINAL  Final     Time coordinating discharge: Over 30 minutes  SIGNED:   Alvira Philips [place], DO  [facility] 06/03/2020, 9:45 AM

## 2020-06-03 NOTE — Discharge Instructions (Signed)
10 Things You Can Do to Manage Your COVID-19 Symptoms at Home If you have possible or confirmed COVID-19: 1. Stay home from work and school. And stay away from other public places. If you must go out, avoid using any kind of public transportation, ridesharing, or taxis. 2. Monitor your symptoms carefully. If your symptoms get worse, call your healthcare provider immediately. 3. Get rest and stay hydrated. 4. If you have a medical appointment, call the healthcare provider ahead of time and tell them that you have or may have COVID-19. 5. For medical emergencies, call 911 and notify the dispatch personnel that you have or may have COVID-19. 6. Cover your cough and sneezes with a tissue or use the inside of your elbow. 7. Wash your hands often with soap and water for at least 20 seconds or clean your hands with an alcohol-based hand sanitizer that contains at least 60% alcohol. 8. As much as possible, stay in a specific room and away from other people in your home. Also, you should use a separate bathroom, if available. If you need to be around other people in or outside of the home, wear a mask. 9. Avoid sharing personal items with other people in your household, like dishes, towels, and bedding. 10. Clean all surfaces that are touched often, like counters, tabletops, and doorknobs. Use household cleaning sprays or wipes according to the label instructions. [email or web address] 03/03/2019 This information is not intended to replace advice given to you by your health care provider. Make sure you discuss any questions you have with your health care provider. Document Revised: 08/05/2019 Document Reviewed: 08/05/2019 Elsevier Patient Education  [place].  COVID-19: How to Protect Yourself and Others Know how it spreads  There is currently no vaccine to prevent coronavirus disease 2019 (COVID-19).  The best way to prevent illness is to avoid being exposed to this virus.  The virus is  thought to spread mainly from person-to-person. ? Between people who are in close contact with one another (within about 6 feet). ? Through respiratory droplets produced when an infected person coughs, sneezes or talks. ? These droplets can land in the mouths or noses of people who are nearby or possibly be inhaled into the lungs. ? COVID-19 may be spread by people who are not showing symptoms. Everyone should Clean your hands often  Wash your hands often with soap and water for at least 20 seconds especially after you have been in a public place, or after blowing your nose, coughing, or sneezing.  If soap and water are not readily available, use a hand sanitizer that contains at least 60% alcohol. Cover all surfaces of your hands and rub them together until they feel dry.  Avoid touching your eyes, nose, and mouth with unwashed hands. Avoid close contact  Limit contact with others as much as possible.  Avoid close contact with people who are sick.  Put distance between yourself and other people. ? Remember that some people without symptoms may be able to spread virus. ? This is especially important for people who are at higher risk of getting very GainPain.com.cy Cover your mouth and nose with a mask when around others  You could spread COVID-19 to others even if you do not feel sick.  Everyone should wear a mask in public settings and when around people not living in their household, especially when social distancing is difficult to maintain. ? Masks should not be placed on young children under age 48, anyone who  has trouble breathing, or is unconscious, incapacitated or otherwise unable to remove the mask without assistance.  The mask is meant to protect other people in case you are infected.  Do NOT use a facemask meant for a Dietitian.  Continue to keep about 6 feet between yourself and others. The  mask is not a substitute for social distancing. Cover coughs and sneezes  Always cover your mouth and nose with a tissue when you cough or sneeze or use the inside of your elbow.  Throw used tissues in the trash.  Immediately wash your hands with soap and water for at least 20 seconds. If soap and water are not readily available, clean your hands with a hand sanitizer that contains at least 60% alcohol. Clean and disinfect  Clean AND disinfect frequently touched surfaces daily. This includes tables, doorknobs, light switches, countertops, handles, desks, phones, keyboards, toilets, faucets, and sinks. [email or web address]  If surfaces are dirty, clean them: Use detergent or soap and water prior to disinfection.  Then, use a household disinfectant. You can see a list of EPA-registered household disinfectants here. [email or web address] 05/05/2019 This information is not intended to replace advice given to you by your health care provider. Make sure you discuss any questions you have with your health care provider. Document Revised: 05/13/2019 Document Reviewed: 03/11/2019 Elsevier Patient Education  2020 [place].   COVID-19 COVID-19 is a respiratory infection that is caused by a virus called severe acute respiratory syndrome coronavirus 2 (SARS-CoV-2). The disease is also known as coronavirus disease or novel coronavirus. In some people, the virus may not cause any symptoms. In others, it may cause a serious infection. The infection can get worse quickly and can lead to complications, such as:  Pneumonia, or infection of the lungs.  Acute respiratory distress syndrome or ARDS. This is a condition in which fluid build-up in the lungs prevents the lungs from filling with air and passing oxygen into the blood.  Acute respiratory failure. This is a condition in which there is not enough oxygen passing from the lungs to the body  or when carbon dioxide is not passing from the lungs out of the body.  Sepsis or septic shock. This is a serious bodily reaction to an infection.  Blood clotting problems.  Secondary infections due to bacteria or fungus.  Organ failure. This is when your body's organs stop working. The virus that causes COVID-19 is contagious. This means that it can spread from person to person through droplets from coughs and sneezes (respiratory secretions). What are the causes? This illness is caused by a virus. You may catch the virus by:  Breathing in droplets from an infected person. Droplets can be spread by a person breathing, speaking, singing, coughing, or sneezing.  Touching something, like a table or a doorknob, that was exposed to the virus (contaminated) and then touching your mouth, nose, or eyes. What increases the risk? Risk for infection You are more likely to be infected with this virus if you:  Are within 6 feet (2 meters) of a person with COVID-19.  Provide care for or live with a person who is infected with COVID-19.  Spend time in crowded indoor spaces or live in shared housing. Risk for serious illness You are more likely to become seriously ill from the virus if you:  Are 26 years of age or older. The higher your age, the more you are at risk for serious illness.  Live in a nursing home or long-term care  facility.  Have cancer.  Have a long-term (chronic) disease such as: ? Chronic lung disease, including chronic obstructive pulmonary disease or asthma. ? A long-term disease that lowers your body's ability to fight infection (immunocompromised). ? Heart disease, including heart failure, a condition in which the arteries that lead to the heart become narrow or blocked (coronary artery disease), a disease which makes the heart muscle thick, weak, or stiff (cardiomyopathy). ? Diabetes. ? Chronic kidney disease. ? Sickle cell disease, a condition in which red blood cells  have an abnormal "sickle" shape. ? Liver disease.  Are obese. What are the signs or symptoms? Symptoms of this condition can range from mild to severe. Symptoms may appear any time from 2 to 14 days after being exposed to the virus. They include:  A fever or chills.  A cough.  Difficulty breathing.  Headaches, body aches, or muscle aches.  Runny or stuffy (congested) nose.  A sore throat.  New loss of taste or smell. Some people may also have stomach problems, such as nausea, vomiting, or diarrhea. Other people may not have any symptoms of COVID-19. How is this diagnosed? This condition may be diagnosed based on:  Your signs and symptoms, especially if: ? You live in an area with a COVID-19 outbreak. ? You recently traveled to or from an area where the virus is common. ? You provide care for or live with a person who was diagnosed with COVID-19. ? You were exposed to a person who was diagnosed with COVID-19.  A physical exam.  Lab tests, which may include: ? Taking a sample of fluid from the back of your nose and throat (nasopharyngeal fluid), your nose, or your throat using a swab. ? A sample of mucus from your lungs (sputum). ? Blood tests.  Imaging tests, which may include, X-rays, CT scan, or ultrasound. How is this treated? At present, there is no medicine to treat COVID-19. Medicines that treat other diseases are being used on a trial basis to see if they are effective against COVID-19. Your health care provider will talk with you about ways to treat your symptoms. For most people, the infection is mild and can be managed at home with rest, fluids, and over-the-counter medicines. Treatment for a serious infection usually takes places in a hospital intensive care unit (ICU). It may include one or more of the following treatments. These treatments are given until your symptoms improve.  Receiving fluids and medicines through an IV.  Supplemental oxygen. Extra oxygen  is given through a tube in the nose, a face mask, or a hood.  Positioning you to lie on your stomach (prone position). This makes it easier for oxygen to get into the lungs.  Continuous positive airway pressure (CPAP) or bi-level positive airway pressure (BPAP) machine. This treatment uses mild air pressure to keep the airways open. A tube that is connected to a motor delivers oxygen to the body.  Ventilator. This treatment moves air into and out of the lungs by using a tube that is placed in your windpipe.  Tracheostomy. This is a procedure to create a hole in the neck so that a breathing tube can be inserted.  Extracorporeal membrane oxygenation (ECMO). This procedure gives the lungs a chance to recover by taking over the functions of the heart and lungs. It supplies oxygen to the body and removes carbon dioxide. Follow these instructions at home: Lifestyle  If you are sick, stay home except to get medical care. Your health  care provider will tell you how long to stay home. Call your health care provider before you go for medical care.  Rest at home as told by your health care provider.  Do not use any products that contain nicotine or tobacco, such as cigarettes, e-cigarettes, and chewing tobacco. If you need help quitting, ask your health care provider.  Return to your normal activities as told by your health care provider. Ask your health care provider what activities are safe for you. General instructions  Take over-the-counter and prescription medicines only as told by your health care provider.  Drink enough fluid to keep your urine pale yellow.  Keep all follow-up visits as told by your health care provider. This is important. How is this prevented?  There is no vaccine to help prevent COVID-19 infection. However, there are steps you can take to protect yourself and others from this virus. To protect yourself:   Do not travel to areas where COVID-19 is a risk. The areas  where COVID-19 is reported change often. To identify high-risk areas and travel restrictions, check the CDC travel website: FatFares.com.br  If you live in, or must travel to, an area where COVID-19 is a risk, take precautions to avoid infection. ? Stay away from people who are sick. ? Wash your hands often with soap and water for 20 seconds. If soap and water are not available, use an alcohol-based hand sanitizer. ? Avoid touching your mouth, face, eyes, or nose. ? Avoid going out in public, follow guidance from your state and local health authorities. ? If you must go out in public, wear a cloth face covering or face mask. Make sure your mask covers your nose and mouth. ? Avoid crowded indoor spaces. Stay at least 6 feet (2 meters) away from others. ? Disinfect objects and surfaces that are frequently touched every day. This may include:  Counters and tables.  Doorknobs and light switches.  Sinks and faucets.  Electronics, such as phones, remote controls, keyboards, computers, and tablets. To protect others: If you have symptoms of COVID-19, take steps to prevent the virus from spreading to others.  If you think you have a COVID-19 infection, contact your health care provider right away. Tell your health care team that you think you may have a COVID-19 infection.  Stay home. Leave your house only to seek medical care. Do not use public transport.  Do not travel while you are sick.  Wash your hands often with soap and water for 20 seconds. If soap and water are not available, use alcohol-based hand sanitizer.  Stay away from other members of your household. Let healthy household members care for children and pets, if possible. If you have to care for children or pets, wash your hands often and wear a mask. If possible, stay in your own room, separate from others. Use a different bathroom.  Make sure that all people in your household wash their hands well and  often.  Cough or sneeze into a tissue or your sleeve or elbow. Do not cough or sneeze into your hand or into the air.  Wear a cloth face covering or face mask. Make sure your mask covers your nose and mouth. Where to find more information  Centers for Disease Control and Prevention: PurpleGadgets.be  World Health Organization: https://www.castaneda.info/ Contact a health care provider if:  You live in or have traveled to an area where COVID-19 is a risk and you have symptoms of the infection.  You have  had contact with someone who has COVID-19 and you have symptoms of the infection. Get help right away if:  You have trouble breathing.  You have pain or pressure in your chest.  You have confusion.  You have bluish lips and fingernails.  You have difficulty waking from sleep.  You have symptoms that get worse. These symptoms may represent a serious problem that is an emergency. Do not wait to see if the symptoms will go away. Get medical help right away. Call your local emergency services (911 in the [place] Do not drive yourself to the hospital. Let the emergency medical personnel know if you think you have COVID-19. Summary  COVID-19 is a respiratory infection that is caused by a virus. It is also known as coronavirus disease or novel coronavirus. It can cause serious infections, such as pneumonia, acute respiratory distress syndrome, acute respiratory failure, or sepsis.  The virus that causes COVID-19 is contagious. This means that it can spread from person to person through droplets from breathing, speaking, singing, coughing, or sneezing.  You are more likely to develop a serious illness if you are 52 years of age or older, have a weak immune system, live in a nursing home, or have chronic disease.  There is no medicine to treat COVID-19. Your health care provider will talk with you about ways to treat your symptoms.  Take steps to  protect yourself and others from infection. Wash your hands often and disinfect objects and surfaces that are frequently touched every day. Stay away from people who are sick and wear a mask if you are sick. This information is not intended to replace advice given to you by your health care provider. Make sure you discuss any questions you have with your health care provider. Document Revised: 06/18/2019 Document Reviewed: 09/24/2018 Elsevier Patient Education  [place].  Viral Gastroenteritis, Adult  Viral gastroenteritis is also known as the stomach flu. This condition may affect your stomach, small intestine, and large intestine. It can cause sudden watery diarrhea, fever, and vomiting. This condition is caused by many different viruses. These viruses can be passed from person to person very easily (are contagious). Diarrhea and vomiting can make you feel weak and cause you to become dehydrated. You may not be able to keep fluids down. Dehydration can make you tired and thirsty, cause you to have a dry mouth, and decrease how often you urinate. It is important to replace the fluids that you lose from diarrhea and vomiting. What are the causes? Gastroenteritis is caused by many viruses, including rotavirus and norovirus. Norovirus is the most common cause in adults. You can get sick after being exposed to the viruses from other people. You can also get sick by:  Eating food, drinking water, or touching a surface contaminated with one of these viruses.  Sharing utensils or other personal items with an infected person. What increases the risk? You are more likely to develop this condition if you:  Have a weak body defense system (immune system).  Live with one or more children who are younger than 3 years old.  Live in a nursing home.  Travel on cruise ships. What are the signs or symptoms? Symptoms of this condition start suddenly 1-3 days after exposure to a virus. Symptoms may  last for a few days or for as long as a week. Common symptoms include watery diarrhea and vomiting. Other symptoms include:  Fever.  Headache.  Fatigue.  Pain in the abdomen.  Chills.  Weakness.  Nausea.  Muscle aches.  Loss of appetite. How is this diagnosed? This condition is diagnosed with a medical history and physical exam. You may also have a stool test to check for viruses or other infections. How is this treated? This condition typically goes away on its own. The focus of treatment is to prevent dehydration and restore lost fluids (rehydration). This condition may be treated with:  An oral rehydration solution (ORS) to replace important salts and minerals (electrolytes) in your body. Take this if told by your health care provider. This is a drink that is sold at pharmacies and retail stores.  Medicines to help with your symptoms.  Probiotic supplements to reduce symptoms of diarrhea.  Fluids given through an IV, if dehydration is severe. Older adults and people with other diseases or a weak immune system are at higher risk for dehydration. Follow these instructions at home:  Eating and drinking   Take an ORS as told by your health care provider.  Drink clear fluids in small amounts as you are able. Clear fluids include: ? Water. ? Ice chips. ? Diluted fruit juice. ? Low-calorie sports drinks.  Drink enough fluid to keep your urine pale yellow.  Eat small amounts of healthy foods every 3-4 hours as you are able. This may include whole grains, fruits, vegetables, lean meats, and yogurt.  Avoid fluids that contain a lot of sugar or caffeine, such as energy drinks, sports drinks, and soda.  Avoid spicy or fatty foods.  Avoid alcohol. General instructions  Wash your hands often, especially after having diarrhea or vomiting. If soap and water are not available, use hand sanitizer.  Make sure that all people in your household wash their hands well and  often.  Take over-the-counter and prescription medicines only as told by your health care provider.  Rest at home while you recover.  Watch your condition for any changes.  Take a warm bath to relieve any burning or pain from frequent diarrhea episodes.  Keep all follow-up visits as told by your health care provider. This is important. Contact a health care provider if you:  Cannot keep fluids down.  Have symptoms that get worse.  Have new symptoms.  Feel light-headed or dizzy.  Have muscle cramps. Get help right away if you:  Have chest pain.  Feel extremely weak or you faint.  See blood in your vomit.  Have vomit that looks like coffee grounds.  Have bloody or black stools or stools that look like tar.  Have a severe headache, a stiff neck, or both.  Have a rash.  Have severe pain, cramping, or bloating in your abdomen.  Have trouble breathing or you are breathing very quickly.  Have a fast heartbeat.  Have skin that feels cold and clammy.  Feel confused.  Have pain when you urinate.  Have signs of dehydration, such as: ? Dark urine, very little urine, or no urine. ? Cracked lips. ? Dry mouth. ? Sunken eyes. ? Sleepiness. ? Weakness. Summary  Viral gastroenteritis is also known as the stomach flu. It can cause sudden watery diarrhea, fever, and vomiting.  This condition can be passed from person to person very easily (is contagious).  Take an ORS if told by your health care provider. This is a drink that is sold at pharmacies and retail stores.  Wash your hands often, especially after having diarrhea or vomiting. If soap and water are not available, use hand sanitizer. This information is  not intended to replace advice given to you by your health care provider. Make sure you discuss any questions you have with your health care provider. Document Revised: 02/05/2019 Document Reviewed: 06/24/2018 Elsevier Patient Education  2020 [place] Under Monitoring Name: [name]  Location: [address] [place] [address]   Infection Prevention Recommendations for Individuals Confirmed to have, or Being Evaluated for, 2019 Novel Coronavirus (COVID-19) Infection Who Receive Care at Home  Individuals who are confirmed to have, or are being evaluated for, COVID-19 should follow the prevention steps below until a healthcare provider or local or state health department says they can return to normal activities.  Stay home except to get medical care You should restrict activities outside your home, except for getting medical care. Do not go to work, school, or public areas, and do not use public transportation or taxis.  Call ahead before visiting your doctor Before your medical appointment, call the healthcare provider and tell them that you have, or are being evaluated for, COVID-19 infection. This will help the healthcare provider's office take steps to keep other people from getting infected. Ask your healthcare provider to call the local or state health department.  Monitor your symptoms Seek prompt medical attention if your illness is worsening (e.g., difficulty breathing). Before going to your medical appointment, call the healthcare provider and tell them that you have, or are being evaluated for, COVID-19 infection. Ask your healthcare provider to call the local or state health department.  Wear a facemask You should wear a facemask that covers your nose and mouth when you are in the same room with other people and when you visit a healthcare provider. People who live with or visit you should also wear a facemask while they are in the same room with you.  Separate yourself from other people in your home As much as possible, you should stay in a different room from other people in your home. Also, you should use a separate bathroom, if available.  Avoid sharing household items You  should not share dishes, drinking glasses, cups, eating utensils, towels, bedding, or other items with other people in your home. After using these items, you should wash them thoroughly with soap and water.  Cover your coughs and sneezes Cover your mouth and nose with a tissue when you cough or sneeze, or you can cough or sneeze into your sleeve. Throw used tissues in a lined trash can, and immediately wash your hands with soap and water for at least 20 seconds or use an alcohol-based hand rub.  Wash your [facility] your hands often and thoroughly with soap and water for at least 20 seconds. You can use an alcohol-based hand sanitizer if soap and water are not available and if your hands are not visibly dirty. Avoid touching your eyes, nose, and mouth with unwashed hands.   Prevention Steps for Caregivers and Household Members of Individuals Confirmed to have, or Being Evaluated for, COVID-19 Infection Being Cared for in the Home  If you live with, or provide care at home for, a person confirmed to have, or being evaluated for, COVID-19 infection please follow these guidelines to prevent infection:  Follow healthcare provider's instructions Make sure that you understand and can help the patient follow any healthcare provider instructions for all care.  Provide for the patient's basic needs You should help the patient with basic needs in the home and provide support for getting groceries,  prescriptions, and other personal needs.  Monitor the patient's symptoms If they are getting sicker, call his or her medical provider and tell them that the patient has, or is being evaluated for, COVID-19 infection. This will help the healthcare provider's office take steps to keep other people from getting infected. Ask the healthcare provider to call the local or state health department.  Limit the number of people who have contact with the patient  If possible, have only one caregiver for the  patient.  Other household members should stay in another home or place of residence. If this is not possible, they should stay  in another room, or be separated from the patient as much as possible. Use a separate bathroom, if available.  Restrict visitors who do not have an essential need to be in the home.  Keep older adults, very young children, and other sick people away from the patient Keep older adults, very young children, and those who have compromised immune systems or chronic health conditions away from the patient. This includes people with chronic heart, lung, or kidney conditions, diabetes, and cancer.  Ensure good ventilation Make sure that shared spaces in the home have good air flow, such as from an air conditioner or an opened window, weather permitting.  Wash your hands often  Wash your hands often and thoroughly with soap and water for at least 20 seconds. You can use an alcohol based hand sanitizer if soap and water are not available and if your hands are not visibly dirty.  Avoid touching your eyes, nose, and mouth with unwashed hands.  Use disposable paper towels to dry your hands. If not available, use dedicated cloth towels and replace them when they become wet.  Wear a facemask and gloves  Wear a disposable facemask at all times in the room and gloves when you touch or have contact with the patient's blood, body fluids, and/or secretions or excretions, such as sweat, saliva, sputum, nasal mucus, vomit, urine, or feces.  Ensure the mask fits over your nose and mouth tightly, and do not touch it during use.  Throw out disposable facemasks and gloves after using them. Do not reuse.  Wash your hands immediately after removing your facemask and gloves.  If your personal clothing becomes contaminated, carefully remove clothing and launder. Wash your hands after handling contaminated clothing.  Place all used disposable facemasks, gloves, and other waste in a lined  container before disposing them with other household waste.  Remove gloves and wash your hands immediately after handling these items.  Do not share dishes, glasses, or other household items with the patient  Avoid sharing household items. You should not share dishes, drinking glasses, cups, eating utensils, towels, bedding, or other items with a patient who is confirmed to have, or being evaluated for, COVID-19 infection.  After the person uses these items, you should wash them thoroughly with soap and water.  Wash laundry thoroughly  Immediately remove and wash clothes or bedding that have blood, body fluids, and/or secretions or excretions, such as sweat, saliva, sputum, nasal mucus, vomit, urine, or feces, on them.  Wear gloves when handling laundry from the patient.  Read and follow directions on labels of laundry or clothing items and detergent. In general, wash and dry with the warmest temperatures recommended on the label.  Clean all areas the individual has used often  Clean all touchable surfaces, such as counters, tabletops, doorknobs, bathroom fixtures, toilets, phones, keyboards, tablets, and bedside tables, every  day. Also, clean any surfaces that may have blood, body fluids, and/or secretions or excretions on them.  Wear gloves when cleaning surfaces the patient has come in contact with.  Use a diluted bleach solution (e.g., dilute bleach with 1 part bleach and 10 parts water) or a household disinfectant with a label that says EPA-registered for coronaviruses. To make a bleach solution at home, add 1 tablespoon of bleach to 1 quart (4 cups) of water. For a larger supply, add  cup of bleach to 1 gallon (16 cups) of water.  Read labels of cleaning products and follow recommendations provided on product labels. Labels contain instructions for safe and effective use of the cleaning product including precautions you should take when applying the product, such as wearing gloves or  eye protection and making sure you have good ventilation during use of the product.  Remove gloves and wash hands immediately after cleaning.  Monitor yourself for signs and symptoms of illness Caregivers and household members are considered close contacts, should monitor their health, and will be asked to limit movement outside of the home to the extent possible. Follow the monitoring steps for close contacts listed on the symptom monitoring form.   ? If you have additional questions, contact your local health department or call the epidemiologist on call at [phone] (available 24/7). ? This guidance is subject to change. For the most up-to-date guidance from [facility], please refer to their website: [email or web address]      [facility] - rolling walker

## 2020-06-03 NOTE — Progress Notes (Signed)
Pt's ride present at the [place] entrance; pt discharged via wheelchair by the orderly to the [facility] entrance

## 2020-06-03 NOTE — TOC Progression Note (Addendum)
Transition of Care [facility]) - Progression Note    Patient Details  Name: [name] MRN: [id number] Date of Birth: 09/16/1935  [facility] CM/SW Contact  Truitt Merle, LCSW Phone Number: 06/03/2020, 11:31 AM  Clinical Narrative:    Received secure message from Dr. [place] stating patient medically ready for discharge home today. P/T recommending home health with walker. Updated Corene Cornea with [employer] (home health) and Brenton Grills with [employer] (DME-walker) of plans for discharge today. Walker to be delivered today and [employer] confirmed patient already has O2 at home PRN. No additional TOC needs at this time. LCSW signing off.   Expected Discharge Plan: [place] Barriers to Discharge: Continued Medical Work up  Expected Discharge Plan and Services Expected Discharge Plan: [place]   Discharge Planning Services: CM Consult Post Acute Care Choice: [place] arrangements for the past 2 months: Single Family Home Expected Discharge Date: 06/03/20                                     Social Determinants of Health (SDOH) Interventions    Readmission Risk Interventions No flowsheet data found.

## 2020-06-03 NOTE — Progress Notes (Signed)
MD order received in [facility] to discharge pt home with home health today; TOC previously established home health PT and OT services with [facility]; DME/rolling walker delivered to the pt's room; verbally reviewed AVS with pt, no new Rxs; no questions voiced at this time; pt's discharge pending arrival of her ride home at the [facility] entrance

## 2020-06-03 NOTE — Plan of Care (Signed)

## 2020-06-04 DIAGNOSIS — Z9981 Dependence on supplemental oxygen: Secondary | ICD-10-CM | POA: Diagnosis not present

## 2020-06-04 DIAGNOSIS — M479 Spondylosis, unspecified: Secondary | ICD-10-CM | POA: Diagnosis not present

## 2020-06-04 DIAGNOSIS — F329 Major depressive disorder, single episode, unspecified: Secondary | ICD-10-CM | POA: Diagnosis not present

## 2020-06-04 DIAGNOSIS — A0839 Other viral enteritis: Secondary | ICD-10-CM | POA: Diagnosis not present

## 2020-06-04 DIAGNOSIS — I10 Essential (primary) hypertension: Secondary | ICD-10-CM | POA: Diagnosis not present

## 2020-06-04 DIAGNOSIS — K5790 Diverticulosis of intestine, part unspecified, without perforation or abscess without bleeding: Secondary | ICD-10-CM | POA: Diagnosis not present

## 2020-06-04 DIAGNOSIS — J84112 Idiopathic pulmonary fibrosis: Secondary | ICD-10-CM | POA: Diagnosis not present

## 2020-06-04 DIAGNOSIS — Z79891 Long term (current) use of opiate analgesic: Secondary | ICD-10-CM | POA: Diagnosis not present

## 2020-06-04 DIAGNOSIS — Z87440 Personal history of urinary (tract) infections: Secondary | ICD-10-CM | POA: Diagnosis not present

## 2020-06-04 DIAGNOSIS — Z9181 History of falling: Secondary | ICD-10-CM | POA: Diagnosis not present

## 2020-06-04 DIAGNOSIS — Z87311 Personal history of (healed) other pathological fracture: Secondary | ICD-10-CM | POA: Diagnosis not present

## 2020-06-04 DIAGNOSIS — E7800 Pure hypercholesterolemia, unspecified: Secondary | ICD-10-CM | POA: Diagnosis not present

## 2020-06-04 DIAGNOSIS — U071 COVID-19: Secondary | ICD-10-CM | POA: Diagnosis not present

## 2020-06-04 DIAGNOSIS — Z9089 Acquired absence of other organs: Secondary | ICD-10-CM | POA: Diagnosis not present

## 2020-06-04 DIAGNOSIS — E559 Vitamin D deficiency, unspecified: Secondary | ICD-10-CM | POA: Diagnosis not present

## 2020-06-04 DIAGNOSIS — N8110 Cystocele, unspecified: Secondary | ICD-10-CM | POA: Diagnosis not present

## 2020-06-04 DIAGNOSIS — Z9049 Acquired absence of other specified parts of digestive tract: Secondary | ICD-10-CM | POA: Diagnosis not present

## 2020-06-04 DIAGNOSIS — R32 Unspecified urinary incontinence: Secondary | ICD-10-CM | POA: Diagnosis not present

## 2020-06-04 DIAGNOSIS — M19019 Primary osteoarthritis, unspecified shoulder: Secondary | ICD-10-CM | POA: Diagnosis not present

## 2020-06-04 DIAGNOSIS — Z7952 Long term (current) use of systemic steroids: Secondary | ICD-10-CM | POA: Diagnosis not present

## 2020-06-04 DIAGNOSIS — M8580 Other specified disorders of bone density and structure, unspecified site: Secondary | ICD-10-CM | POA: Diagnosis not present

## 2020-06-04 DIAGNOSIS — F419 Anxiety disorder, unspecified: Secondary | ICD-10-CM | POA: Diagnosis not present

## 2020-06-04 DIAGNOSIS — Z8701 Personal history of pneumonia (recurrent): Secondary | ICD-10-CM | POA: Diagnosis not present

## 2020-06-04 DIAGNOSIS — G4734 Idiopathic sleep related nonobstructive alveolar hypoventilation: Secondary | ICD-10-CM | POA: Diagnosis not present

## 2020-06-05 ENCOUNTER — Telehealth: Payer: Self-pay

## 2020-06-05 NOTE — Telephone Encounter (Signed)
Transition Care Management Follow-up Telephone Call  Date of discharge and from where: 06/03/2020, [facility]  How have you been since you were released from the hospital? Patient states that she is feeling better but she is still very weak.   Any questions or concerns? No  Items Reviewed:  Did the pt receive and understand the discharge instructions provided? Yes   Medications obtained and verified? Yes   Any new allergies since your discharge? No   Dietary orders reviewed? Yes  Do you have support at home? Yes   Functional Questionnaire: (I = Independent and D = Dependent) ADLs: I  Bathing/Dressing- I  Meal Prep- I  Eating- I  Maintaining continence- I  Transferring/Ambulation- I  Managing Meds- I  Follow up appointments reviewed:   PCP Hospital f/u appt confirmed? Yes  Scheduled to see Alma Friendly, NP on 06/13/2020 @ 11:40 am.  [facility] f/u appt confirmed? N/A   Are transportation arrangements needed? No   If their condition worsens, is the pt aware to call PCP or go to the Emergency Dept.? Yes  Was the patient provided with contact information for the PCP's office or ED? Yes  Was to pt encouraged to call back with questions or concerns? Yes

## 2020-06-07 ENCOUNTER — Telehealth: Payer: Self-pay | Admitting: *Deleted

## 2020-06-07 DIAGNOSIS — U071 COVID-19: Secondary | ICD-10-CM | POA: Diagnosis not present

## 2020-06-07 DIAGNOSIS — K5790 Diverticulosis of intestine, part unspecified, without perforation or abscess without bleeding: Secondary | ICD-10-CM | POA: Diagnosis not present

## 2020-06-07 DIAGNOSIS — J84112 Idiopathic pulmonary fibrosis: Secondary | ICD-10-CM | POA: Diagnosis not present

## 2020-06-07 DIAGNOSIS — E7800 Pure hypercholesterolemia, unspecified: Secondary | ICD-10-CM | POA: Diagnosis not present

## 2020-06-07 DIAGNOSIS — I10 Essential (primary) hypertension: Secondary | ICD-10-CM | POA: Diagnosis not present

## 2020-06-07 DIAGNOSIS — A0839 Other viral enteritis: Secondary | ICD-10-CM | POA: Diagnosis not present

## 2020-06-07 NOTE — Telephone Encounter (Signed)
Approved.  

## 2020-06-07 NOTE — Telephone Encounter (Signed)
Called and given order to [facility]. No further questions at this time.

## 2020-06-07 NOTE — Telephone Encounter (Signed)
[name] with [facility] called requesting verbal orders for PT. [name] is requesting three times a week for one week, twice a week for three weeks and once a week for one week.

## 2020-06-08 DIAGNOSIS — E7800 Pure hypercholesterolemia, unspecified: Secondary | ICD-10-CM | POA: Diagnosis not present

## 2020-06-08 DIAGNOSIS — I10 Essential (primary) hypertension: Secondary | ICD-10-CM | POA: Diagnosis not present

## 2020-06-08 DIAGNOSIS — A0839 Other viral enteritis: Secondary | ICD-10-CM | POA: Diagnosis not present

## 2020-06-08 DIAGNOSIS — J84112 Idiopathic pulmonary fibrosis: Secondary | ICD-10-CM | POA: Diagnosis not present

## 2020-06-08 DIAGNOSIS — K5790 Diverticulosis of intestine, part unspecified, without perforation or abscess without bleeding: Secondary | ICD-10-CM | POA: Diagnosis not present

## 2020-06-08 DIAGNOSIS — U071 COVID-19: Secondary | ICD-10-CM | POA: Diagnosis not present

## 2020-06-09 ENCOUNTER — Other Ambulatory Visit: Payer: Self-pay | Admitting: *Deleted

## 2020-06-09 ENCOUNTER — Telehealth: Payer: Self-pay | Admitting: Primary Care

## 2020-06-09 DIAGNOSIS — U071 COVID-19: Secondary | ICD-10-CM | POA: Diagnosis not present

## 2020-06-09 DIAGNOSIS — E7800 Pure hypercholesterolemia, unspecified: Secondary | ICD-10-CM | POA: Diagnosis not present

## 2020-06-09 DIAGNOSIS — K5790 Diverticulosis of intestine, part unspecified, without perforation or abscess without bleeding: Secondary | ICD-10-CM | POA: Diagnosis not present

## 2020-06-09 DIAGNOSIS — J84112 Idiopathic pulmonary fibrosis: Secondary | ICD-10-CM | POA: Diagnosis not present

## 2020-06-09 DIAGNOSIS — A0839 Other viral enteritis: Secondary | ICD-10-CM | POA: Diagnosis not present

## 2020-06-09 DIAGNOSIS — I10 Essential (primary) hypertension: Secondary | ICD-10-CM | POA: Diagnosis not present

## 2020-06-09 NOTE — Telephone Encounter (Signed)
Home Health Verbal Orders - Caller/Agency: [facility]// [place] Number: [phone] Requesting OT/PT/Skilled Nursing/Social Work/Speech Therapy: OT Frequency: 1 week 2

## 2020-06-09 NOTE — Patient Outreach (Signed)
[facility]) Care Management  06/09/2020  [name] 06/11/1936 [id number]   EMMI-GENERAL DISCHARGE-SUCCESSFUL-RESOLVED RED ON EMMI ALERT Day #4 Date:  06/08/2020 Red Alert Reason: DID NOT READ DISCHARGE PAPERS, LOST OF INTEREST AND SAD/HOPLESS  OUTREACH #1 Spoke with pt today and introduced [facility] and purpose or today's call. RN inquired on the emmi above as pt reports she is just "weak" and trying to get her strength back. Denies any sucidial thoughts and has supportive family with her spouse, daughter and a son. Pt's spouse has reviewed her discharge papers however she has not. RN strongly encouraged to review or any possible changes. Pt indicated she would after this call but has had her follow up visit with her provider and there was no changes in her medications. Also states [facility] will be visiting today as she has PT/OT services to start.   No other issues as the above emmi resolved with no additional needs. Case will be closed.  Raina Mina, RN Care Management Coordinator [place] Office [phone]

## 2020-06-10 NOTE — Telephone Encounter (Signed)
Approved.  

## 2020-06-12 DIAGNOSIS — I10 Essential (primary) hypertension: Secondary | ICD-10-CM | POA: Diagnosis not present

## 2020-06-12 DIAGNOSIS — U071 COVID-19: Secondary | ICD-10-CM | POA: Diagnosis not present

## 2020-06-12 DIAGNOSIS — A0839 Other viral enteritis: Secondary | ICD-10-CM | POA: Diagnosis not present

## 2020-06-12 DIAGNOSIS — K5790 Diverticulosis of intestine, part unspecified, without perforation or abscess without bleeding: Secondary | ICD-10-CM | POA: Diagnosis not present

## 2020-06-12 DIAGNOSIS — E7800 Pure hypercholesterolemia, unspecified: Secondary | ICD-10-CM | POA: Diagnosis not present

## 2020-06-12 DIAGNOSIS — J84112 Idiopathic pulmonary fibrosis: Secondary | ICD-10-CM | POA: Diagnosis not present

## 2020-06-12 NOTE — Telephone Encounter (Signed)
Called verbal given. No further questions.

## 2020-06-13 ENCOUNTER — Encounter: Payer: Self-pay | Admitting: Primary Care

## 2020-06-13 ENCOUNTER — Other Ambulatory Visit: Payer: Self-pay

## 2020-06-13 ENCOUNTER — Ambulatory Visit (INDEPENDENT_AMBULATORY_CARE_PROVIDER_SITE_OTHER): Payer: Medicare Other | Admitting: Primary Care

## 2020-06-13 VITALS — BP 120/68 | HR 72 | Temp 97.7°F | Ht 60.0 in | Wt 124.0 lb

## 2020-06-13 DIAGNOSIS — E7800 Pure hypercholesterolemia, unspecified: Secondary | ICD-10-CM | POA: Diagnosis not present

## 2020-06-13 DIAGNOSIS — U071 COVID-19: Secondary | ICD-10-CM | POA: Diagnosis not present

## 2020-06-13 DIAGNOSIS — K5790 Diverticulosis of intestine, part unspecified, without perforation or abscess without bleeding: Secondary | ICD-10-CM | POA: Diagnosis not present

## 2020-06-13 DIAGNOSIS — I10 Essential (primary) hypertension: Secondary | ICD-10-CM | POA: Diagnosis not present

## 2020-06-13 DIAGNOSIS — A0839 Other viral enteritis: Secondary | ICD-10-CM | POA: Diagnosis not present

## 2020-06-13 DIAGNOSIS — J84112 Idiopathic pulmonary fibrosis: Secondary | ICD-10-CM | POA: Diagnosis not present

## 2020-06-13 NOTE — Assessment & Plan Note (Signed)
Significant improvement with mild symptoms today.  Encouraged proper hydration. Continue outpatient PT.  She is out of her quarantine period. We discussed to obtain her Booster vaccine 45 days after her discharge.   Exam today stable.  Hospital notes, labs, imaging reviewed.

## 2020-06-13 NOTE — Progress Notes (Signed)
Subjective:    Patient ID: [name], female    DOB: 1935/11/14, 84 y.o.   MRN: [id number]  HPI  This visit occurred during the SARS-CoV-2 public health emergency.  Safety protocols were in place, including screening questions prior to the visit, additional usage of staff PPE, and extensive cleaning of exam room while observing appropriate contact time as indicated for disinfecting solutions.   [name] is a 84 year old female with a history of hypertension, OSA, pulmonary fibrosis, rectal bleeding, Covid-19 infection who presents today for TCM hospital follow up.  She presented to [facility] ED on 05/27/20 with reports of weakness, diarrhea, fatigue, sore throat. Symptoms began four days ago, was exposed to Covid-19 7 days prior. During her stay in the ED she was treated with IV fluids and underwent lab testing. Labs revealed Covid-19 infection. Given her symptoms and age she was admitted for further treatment.  During her hospital stay she had no hypoxia. GI panels were negative for infectious cause of diarrhea. She continued to require IV hydration given diarrhea. Imodium was provided. Cardiology was consulted given concerns for abnormal ECG rhythm, recommendation was for monitoring given absence of heart failure symptoms and normal LVEF on recent echo.  She was initiated on remdesivir and Questran. PT and OT treated while inpatient. She was discharged home on 06/03/20 with recommendations for PCP follow up, continued isolation for 14 days from diagnosis of Covid-19, and booster immunization in 45 days.  Since her discharge home she's feeling much better. She denies diarrhea. She continues to feel generalized weakness, is trying to remain active and is able to do a few of her home exercises. She's starting to fix her own meals, wash laundry and dishes.   She has been visited by home health PT/OT several times. OT discharged patient after two visits. PT is coming twice weekly starting this  week. She is compliant to her nasal cannula at night.   Review of Systems  Constitutional:       Mild fatigue, overall improved  Respiratory: Negative for cough.   Cardiovascular: Negative for chest pain.  Gastrointestinal: Negative for diarrhea, nausea and vomiting.  Neurological: Negative for dizziness and headaches.       Past Medical History:  Diagnosis Date   Altered mental state    Anxiety    BMI 30.0-30.9,adult    Cellulitis    Chest pain, atypical    Compression fracture of L4 lumbar vertebra    Constipation    Cystitis    Cystocele    Diverticulosis    Fatigue    Fibrosis, idiopathic pulmonary (HCC)    Gastroenteritis    History of back surgery    History of herniated intervertebral disc    HTN (hypertension)    Hypercholesteremia    Hypocalcemia    Incontinence of urine    OA (osteoarthritis)    shoulder and back   Obesity    Osteopenia    Pneumonia    Shortness of breath    Sleep apnea    Thrush    Vitamin D deficiency      Social History   Socioeconomic History   Marital status: Married    Spouse name: [name]   Number of children: 2   Years of education: Not on file   Highest education level: Not on file  Occupational History   Not on file  Tobacco Use   Smoking status: Never Smoker   Smokeless tobacco: Never Used  Vaping Use  Vaping Use: Never used  Substance and Sexual Activity   Alcohol use: No   Drug use: No   Sexual activity: Never    Birth control/protection: None  Other Topics Concern   Not on file  Social History Narrative   Not on file   Social Determinants of Health   Financial Resource Strain:    Difficulty of Paying Living Expenses: Not on file  Food Insecurity:    Worried About Charity fundraiser in the Last Year: Not on file   [employer] of Food in the Last Year: Not on file  Transportation Needs:    Lack of Transportation (Medical): Not on file   Lack of Transportation  (Non-Medical): Not on file  Physical Activity:    Days of Exercise per Week: Not on file   Minutes of Exercise per Session: Not on file  Stress:    Feeling of Stress : Not on file  Social Connections:    Frequency of Communication with Friends and Family: Not on file   Frequency of Social Gatherings with Friends and Family: Not on file   Attends Religious Services: Not on file   Active Member of Clubs or Organizations: Not on file   Attends Archivist Meetings: Not on file   Marital Status: Not on file  Intimate Partner Violence:    Fear of Current or Ex-Partner: Not on file   Emotionally Abused: Not on file   Physically Abused: Not on file   Sexually Abused: Not on file    Past Surgical History:  Procedure Laterality Date   BACK SURGERY     CATARACT EXTRACTION W/PHACO Left 04/24/2016   Procedure: CATARACT EXTRACTION PHACO AND INTRAOCULAR LENS PLACEMENT ([place]);  Surgeon: Leandrew Koyanagi, MD;  Location: [place];  Service: Ophthalmology;  Laterality: Left;   CATARACT EXTRACTION W/PHACO Right 07/14/2017   Procedure: CATARACT EXTRACTION PHACO AND INTRAOCULAR LENS PLACEMENT ([place])  RIGHT;  Surgeon: Leandrew Koyanagi, MD;  Location: [place];  Service: Ophthalmology;  Laterality: Right;   CHOLECYSTECTOMY     ESOPHAGOGASTRODUODENOSCOPY (EGD) WITH PROPOFOL N/A 10/13/2018   Procedure: ESOPHAGOGASTRODUODENOSCOPY (EGD) WITH PROPOFOL;  Surgeon: [name], MD;  Location: [facility] ENDOSCOPY;  Service: Gastroenterology;  Laterality: N/A;   FLEXIBLE SIGMOIDOSCOPY N/A 10/13/2018   Procedure: FLEXIBLE SIGMOIDOSCOPY;  Surgeon: [name], MD;  Location: [facility] ENDOSCOPY;  Service: Gastroenterology;  Laterality: N/A;   FLEXIBLE SIGMOIDOSCOPY N/A 09/09/2019   Procedure: FLEXIBLE SIGMOIDOSCOPY;  Surgeon: [name], MD;  Location: [facility] ENDOSCOPY;  Service: Gastroenterology;  Laterality: N/A;   ROTATOR CUFF REPAIR Bilateral    left-12/2009; right-03/2009  [email or web address]   TONSILLECTOMY     TOTAL SHOULDER REPLACEMENT Left     Family History  Problem Relation Age of Onset   COPD Mother    Cancer Brother        colon    No Known Allergies  Current Outpatient Medications on File Prior to Visit  Medication Sig Dispense Refill   Calcium Polycarbophil 625 MG CHEW Chew 1 tablet by mouth daily.     Cholecalciferol 25 MCG (1000 UT) capsule Take 1,000 Units by mouth daily.     citalopram (CELEXA) 40 MG tablet Take 1 tablet (40 mg total) by mouth daily. For anxiety. 90 tablet 1   denosumab (PROLIA) 60 MG/ML SOSY injection Inject 60 mg into the skin every 6 (six) months.     ESBRIET 267 MG TABS Take 6 tablets by mouth daily.      HYDROmorphone (DILAUDID) 4  MG tablet Take 4 mg by mouth 3 (three) times daily.      OXYGEN Inhale into the lungs. 2 L at night     VOLTAREN 1 % GEL   2   No current facility-administered medications on file prior to visit.    BP 120/68    Pulse 72    Temp 97.7 F (36.5 C) (Temporal)    Ht 5' (1.524 m)    Wt 124 lb (56.2 kg)    SpO2 94%    BMI 24.22 kg/m    Objective:   Physical Exam Constitutional:      Appearance: Normal appearance. She is not ill-appearing.  Cardiovascular:     Rate and Rhythm: Normal rate and regular rhythm.  Pulmonary:     Effort: Pulmonary effort is normal.     Breath sounds: Normal breath sounds.  Musculoskeletal:     Cervical back: Neck supple.  Skin:    General: Skin is warm and dry.  Neurological:     Mental Status: She is alert and oriented to person, place, and time.            Assessment & Plan:

## 2020-06-13 NOTE — Patient Instructions (Signed)
Be sure to remain hydrated with plenty of water.  Continue with physical therapy.  Complete your booster Covid-19 vaccine 45 days after October 2nd.  It was a pleasure to see you today!

## 2020-06-19 DIAGNOSIS — R197 Diarrhea, unspecified: Secondary | ICD-10-CM

## 2020-06-20 DIAGNOSIS — E7800 Pure hypercholesterolemia, unspecified: Secondary | ICD-10-CM | POA: Diagnosis not present

## 2020-06-20 DIAGNOSIS — A0839 Other viral enteritis: Secondary | ICD-10-CM | POA: Diagnosis not present

## 2020-06-20 DIAGNOSIS — K5790 Diverticulosis of intestine, part unspecified, without perforation or abscess without bleeding: Secondary | ICD-10-CM | POA: Diagnosis not present

## 2020-06-20 DIAGNOSIS — J84112 Idiopathic pulmonary fibrosis: Secondary | ICD-10-CM | POA: Diagnosis not present

## 2020-06-20 DIAGNOSIS — U071 COVID-19: Secondary | ICD-10-CM | POA: Diagnosis not present

## 2020-06-20 DIAGNOSIS — I10 Essential (primary) hypertension: Secondary | ICD-10-CM | POA: Diagnosis not present

## 2020-06-21 ENCOUNTER — Telehealth: Payer: Self-pay

## 2020-06-21 NOTE — Telephone Encounter (Signed)
Pt left vm requesting Dr. Georgeann Oppenheim advice. Pt states she's been experiencing diarrhea and abdominal pain. Pt has an appointment scheduled to see Dr. Vicente Males on 07-04-20 but wants to know what she could take to ease her symptoms in the meantime.

## 2020-06-21 NOTE — Telephone Encounter (Signed)
Check stools for C diff, GI pcr, calprotecting, if taking any nsaids she needs to stop asap

## 2020-06-22 DIAGNOSIS — J84112 Idiopathic pulmonary fibrosis: Secondary | ICD-10-CM | POA: Diagnosis not present

## 2020-06-22 DIAGNOSIS — E7800 Pure hypercholesterolemia, unspecified: Secondary | ICD-10-CM | POA: Diagnosis not present

## 2020-06-22 DIAGNOSIS — K5790 Diverticulosis of intestine, part unspecified, without perforation or abscess without bleeding: Secondary | ICD-10-CM | POA: Diagnosis not present

## 2020-06-22 DIAGNOSIS — I10 Essential (primary) hypertension: Secondary | ICD-10-CM | POA: Diagnosis not present

## 2020-06-22 DIAGNOSIS — A0839 Other viral enteritis: Secondary | ICD-10-CM | POA: Diagnosis not present

## 2020-06-22 DIAGNOSIS — U071 COVID-19: Secondary | ICD-10-CM | POA: Diagnosis not present

## 2020-06-22 NOTE — Telephone Encounter (Signed)
Patient calling again requesting someone to call her back and discuss what to do. Please advise and let pt know.

## 2020-06-24 ENCOUNTER — Other Ambulatory Visit: Payer: Self-pay

## 2020-06-27 ENCOUNTER — Telehealth: Payer: Self-pay | Admitting: Radiology

## 2020-06-27 DIAGNOSIS — U071 COVID-19: Secondary | ICD-10-CM | POA: Diagnosis not present

## 2020-06-27 DIAGNOSIS — E7800 Pure hypercholesterolemia, unspecified: Secondary | ICD-10-CM | POA: Diagnosis not present

## 2020-06-27 DIAGNOSIS — I10 Essential (primary) hypertension: Secondary | ICD-10-CM | POA: Diagnosis not present

## 2020-06-27 DIAGNOSIS — J84112 Idiopathic pulmonary fibrosis: Secondary | ICD-10-CM | POA: Diagnosis not present

## 2020-06-27 DIAGNOSIS — K5790 Diverticulosis of intestine, part unspecified, without perforation or abscess without bleeding: Secondary | ICD-10-CM | POA: Diagnosis not present

## 2020-06-27 DIAGNOSIS — A0839 Other viral enteritis: Secondary | ICD-10-CM | POA: Diagnosis not present

## 2020-06-27 NOTE — Telephone Encounter (Signed)
Person came into the hallway demanding I give him stool testing supplies. I asked him to pull his mask on as we walked to the lab. I asked him to give me the patient information, he became irate saying, " they just called, Just give me the stuff". I told him, there are several tests and I needed to lookup the patient to check for the order. Again yelling that we had just called,  I told him I am not the person that made the call.  The whole time he is yelling , Saying Jesus Christ, I informed him my name was Atsushi Yom, not JC.  He couldn't give me a correct birthday so I asked for an address. Again, everything was a loud yelling argument. As I gathered the supplies needed he was berating [place] saying," we were just two cent workers, etc." , at that point Randall An, RN came over to check on the situation. I tried to give him verbal instructions on the collection process and he said," to call the patient with the instructions".  Patient notified of instructions.

## 2020-06-28 ENCOUNTER — Other Ambulatory Visit: Payer: Medicare Other

## 2020-06-28 DIAGNOSIS — R197 Diarrhea, unspecified: Secondary | ICD-10-CM

## 2020-06-28 NOTE — Addendum Note (Signed)
Addended by: Cloyd Stagers on: 06/28/2020 10:57 AM   Modules accepted: Orders

## 2020-06-29 DIAGNOSIS — E7800 Pure hypercholesterolemia, unspecified: Secondary | ICD-10-CM | POA: Diagnosis not present

## 2020-06-29 DIAGNOSIS — A0839 Other viral enteritis: Secondary | ICD-10-CM | POA: Diagnosis not present

## 2020-06-29 DIAGNOSIS — K5790 Diverticulosis of intestine, part unspecified, without perforation or abscess without bleeding: Secondary | ICD-10-CM | POA: Diagnosis not present

## 2020-06-29 DIAGNOSIS — U071 COVID-19: Secondary | ICD-10-CM | POA: Diagnosis not present

## 2020-06-29 DIAGNOSIS — I10 Essential (primary) hypertension: Secondary | ICD-10-CM | POA: Diagnosis not present

## 2020-06-29 DIAGNOSIS — J84112 Idiopathic pulmonary fibrosis: Secondary | ICD-10-CM | POA: Diagnosis not present

## 2020-06-29 NOTE — Telephone Encounter (Signed)
Called pt to inform her of Dr. Georgeann Oppenheim advice. Unable to contact, lvm to return call.

## 2020-06-30 LAB — GASTROINTESTINAL PATHOGEN PANEL PCR
C. difficile Tox A/B, PCR: DETECTED — AB
Campylobacter, PCR: NOT DETECTED
Cryptosporidium, PCR: NOT DETECTED
E coli (ETEC) LT/ST PCR: NOT DETECTED
E coli (STEC) stx1/stx2, PCR: NOT DETECTED
E coli 0157, PCR: NOT DETECTED
Giardia lamblia, PCR: NOT DETECTED
Norovirus, PCR: NOT DETECTED
Rotavirus A, PCR: NOT DETECTED
Salmonella, PCR: NOT DETECTED
Shigella, PCR: NOT DETECTED

## 2020-07-01 ENCOUNTER — Other Ambulatory Visit: Payer: Self-pay | Admitting: Primary Care

## 2020-07-01 DIAGNOSIS — A0472 Enterocolitis due to Clostridium difficile, not specified as recurrent: Secondary | ICD-10-CM

## 2020-07-01 MED ORDER — VANCOMYCIN HCL 125 MG PO CAPS: 125.0000 mg | ORAL_CAPSULE | Freq: Four times a day (QID) | ORAL | 0 refills | Status: DC

## 2020-07-03 ENCOUNTER — Telehealth: Payer: Self-pay | Admitting: Primary Care

## 2020-07-03 DIAGNOSIS — A0839 Other viral enteritis: Secondary | ICD-10-CM | POA: Diagnosis not present

## 2020-07-03 DIAGNOSIS — K5790 Diverticulosis of intestine, part unspecified, without perforation or abscess without bleeding: Secondary | ICD-10-CM | POA: Diagnosis not present

## 2020-07-03 DIAGNOSIS — I10 Essential (primary) hypertension: Secondary | ICD-10-CM | POA: Diagnosis not present

## 2020-07-03 DIAGNOSIS — J84112 Idiopathic pulmonary fibrosis: Secondary | ICD-10-CM | POA: Diagnosis not present

## 2020-07-03 DIAGNOSIS — E7800 Pure hypercholesterolemia, unspecified: Secondary | ICD-10-CM | POA: Diagnosis not present

## 2020-07-03 DIAGNOSIS — U071 COVID-19: Secondary | ICD-10-CM | POA: Diagnosis not present

## 2020-07-03 NOTE — Telephone Encounter (Signed)
[name] needing Verbal for  recertify to extend PT services Making progress needs endurance  1 week 1 2 week 2 1 week 2

## 2020-07-03 NOTE — Telephone Encounter (Signed)
Approved.  

## 2020-07-03 NOTE — Telephone Encounter (Signed)
Ok to give orders

## 2020-07-03 NOTE — Telephone Encounter (Signed)
Called l/m to give verbal per Anda Kraft. Told to call back If any questions.

## 2020-07-04 ENCOUNTER — Ambulatory Visit (INDEPENDENT_AMBULATORY_CARE_PROVIDER_SITE_OTHER): Payer: Medicare Other | Admitting: Gastroenterology

## 2020-07-04 ENCOUNTER — Other Ambulatory Visit: Payer: Self-pay

## 2020-07-04 VITALS — BP 133/82 | HR 83 | Temp 98.4°F | Ht 60.0 in | Wt 120.0 lb

## 2020-07-04 DIAGNOSIS — U071 COVID-19: Secondary | ICD-10-CM | POA: Diagnosis not present

## 2020-07-04 DIAGNOSIS — I10 Essential (primary) hypertension: Secondary | ICD-10-CM | POA: Diagnosis not present

## 2020-07-04 DIAGNOSIS — A0472 Enterocolitis due to Clostridium difficile, not specified as recurrent: Secondary | ICD-10-CM

## 2020-07-04 DIAGNOSIS — Z9089 Acquired absence of other organs: Secondary | ICD-10-CM | POA: Diagnosis not present

## 2020-07-04 DIAGNOSIS — E7800 Pure hypercholesterolemia, unspecified: Secondary | ICD-10-CM | POA: Diagnosis not present

## 2020-07-04 DIAGNOSIS — Z9049 Acquired absence of other specified parts of digestive tract: Secondary | ICD-10-CM | POA: Diagnosis not present

## 2020-07-04 DIAGNOSIS — Z9181 History of falling: Secondary | ICD-10-CM | POA: Diagnosis not present

## 2020-07-04 DIAGNOSIS — J84112 Idiopathic pulmonary fibrosis: Secondary | ICD-10-CM | POA: Diagnosis not present

## 2020-07-04 DIAGNOSIS — Z8701 Personal history of pneumonia (recurrent): Secondary | ICD-10-CM | POA: Diagnosis not present

## 2020-07-04 DIAGNOSIS — N8110 Cystocele, unspecified: Secondary | ICD-10-CM | POA: Diagnosis not present

## 2020-07-04 DIAGNOSIS — A0839 Other viral enteritis: Secondary | ICD-10-CM | POA: Diagnosis not present

## 2020-07-04 DIAGNOSIS — Z9981 Dependence on supplemental oxygen: Secondary | ICD-10-CM | POA: Diagnosis not present

## 2020-07-04 DIAGNOSIS — M8580 Other specified disorders of bone density and structure, unspecified site: Secondary | ICD-10-CM | POA: Diagnosis not present

## 2020-07-04 DIAGNOSIS — Z79891 Long term (current) use of opiate analgesic: Secondary | ICD-10-CM | POA: Diagnosis not present

## 2020-07-04 DIAGNOSIS — F419 Anxiety disorder, unspecified: Secondary | ICD-10-CM | POA: Diagnosis not present

## 2020-07-04 DIAGNOSIS — Z87440 Personal history of urinary (tract) infections: Secondary | ICD-10-CM | POA: Diagnosis not present

## 2020-07-04 DIAGNOSIS — M19019 Primary osteoarthritis, unspecified shoulder: Secondary | ICD-10-CM | POA: Diagnosis not present

## 2020-07-04 DIAGNOSIS — K5790 Diverticulosis of intestine, part unspecified, without perforation or abscess without bleeding: Secondary | ICD-10-CM | POA: Diagnosis not present

## 2020-07-04 DIAGNOSIS — R32 Unspecified urinary incontinence: Secondary | ICD-10-CM | POA: Diagnosis not present

## 2020-07-04 DIAGNOSIS — G4734 Idiopathic sleep related nonobstructive alveolar hypoventilation: Secondary | ICD-10-CM | POA: Diagnosis not present

## 2020-07-04 DIAGNOSIS — F329 Major depressive disorder, single episode, unspecified: Secondary | ICD-10-CM | POA: Diagnosis not present

## 2020-07-04 DIAGNOSIS — Z7952 Long term (current) use of systemic steroids: Secondary | ICD-10-CM | POA: Diagnosis not present

## 2020-07-04 DIAGNOSIS — Z87311 Personal history of (healed) other pathological fracture: Secondary | ICD-10-CM | POA: Diagnosis not present

## 2020-07-04 DIAGNOSIS — K529 Noninfective gastroenteritis and colitis, unspecified: Secondary | ICD-10-CM

## 2020-07-04 DIAGNOSIS — M479 Spondylosis, unspecified: Secondary | ICD-10-CM | POA: Diagnosis not present

## 2020-07-04 DIAGNOSIS — E559 Vitamin D deficiency, unspecified: Secondary | ICD-10-CM | POA: Diagnosis not present

## 2020-07-04 MED ORDER — SULFASALAZINE 500 MG PO TABS: 500.0000 mg | ORAL_TABLET | Freq: Four times a day (QID) | ORAL | 5 refills | Status: DC

## 2020-07-04 NOTE — Progress Notes (Signed)
[name] MD, MRCP(U.K) [address]  [place]  [place], [place] [address]  Main: [phone]  Fax: [phone]   Primary Care Physician: [name], NP  Primary Gastroenterologist:  Dr. [name]    C diff diarrhea follow up   HPI: [name] is a 84 y.o. female   Summary of history :   She was previously seen in 2020 diarrhea after being referred by her primary care physician in January 2020.  At that point of time she has been having diarrhea for over 2 months She was seen in 2018 by [facility] clinic GI  in12/2009she had a colonoscopy as per the last GI note showed chronic colitis with focal cryptitis. Focal active colitis. Not sure why it was not felt that this patient had inflammatory bowel disease. At her initial visit she had been on Meloxicam for about 2-3 Months, 2 tablets a day and no other NSAID's.  She also did have blood in her stool at that point of time.  10/13/2018: EGD: normal . SigmoidoscopyDiscontinuous areas of nonbleeding ulcerated mucosa with no stigmata of recent bleeding were present in the sigmoid colon, in thedescending colon and in the distal transverse colon. Biopsies were taken with a cold forceps for histology.Rectum was normal. Pathology report showed chronic active colitis and normal rectal biopsies. HSV and CMV negative.  In February 2021 when she came to see me she had stopped meloxicam and doing well.  Was not having any diarrhea.  09/09/2019: Sigmoidoscopy: I went all the way to her mid transverse colon.  No evidence of colitis was seen.  There was a single solitary ulcer in the proximal descending colon.  No bleeding was seen.  Biopsies were taken with forceps.  And it demonstrated chronic colitis with mild to moderate activity and small crypt abscess. 09/13/2019: Vitamin D normal, hemoglobin 13.7 with a platelet count of 224 and a white cell count of 9.8.  CMP was normal.   Interval history9/22/2020-10/14/2019   Denies any diarrhea. On Dilaudid been having some constipation. Left upper quadrant pain relieved with defecation. Daily bowel movements but sensation of incomplete defecation. No rectal bleeding.  She called our office a few days with diarrhea and we suggested she get her stool tested for C. difficile and GI PCR.  Had C. difficile toxin/PCR was positive.  She has been commenced on vancomycin.  06/02/2020 CRP 0.7, CMP showed a calcium of 8.7 normal AST and ALT.  Hemoglobin on 06/01/2020 showed a normal study with normal differential count  She states that she only picked up the antibiotics i.e. vancomycin yesterday and has only taken 1 dose.  Still having multiple bowel movements a day.  Nonbloody.  Some cramping.  Not taking any NSAIDs.  Stop sulfasalazine a few months back as she ran out and did not know that she had to take it long-term.    Current Outpatient Medications  Medication Sig Dispense Refill  . Calcium Polycarbophil 625 MG CHEW Chew 1 tablet by mouth daily.    . Cholecalciferol 25 MCG (1000 UT) capsule Take 1,000 Units by mouth daily.    . citalopram (CELEXA) 40 MG tablet Take 1 tablet (40 mg total) by mouth daily. For anxiety. 90 tablet 1  . denosumab (PROLIA) 60 MG/ML SOSY injection Inject 60 mg into the skin every 6 (six) months.    . ESBRIET 267 MG TABS Take 6 tablets by mouth daily.     Marland Kitchen HYDROmorphone (DILAUDID) 4 MG tablet Take 4  mg by mouth 3 (three) times daily.     . OXYGEN Inhale into the lungs. 2 L at night    . vancomycin (VANCOCIN) 125 MG capsule Take 1 capsule (125 mg total) by mouth 4 (four) times daily. 40 capsule 0  . VOLTAREN 1 % GEL   2   No current facility-administered medications for this visit.    Allergies as of 07/04/2020  . (No Known Allergies)    ROS:  General: Negative for anorexia, weight loss, fever, chills, fatigue, weakness. ENT: Negative for hoarseness, difficulty swallowing , nasal congestion. CV: Negative for chest pain, angina,  palpitations, dyspnea on exertion, peripheral edema.  Respiratory: Negative for dyspnea at rest, dyspnea on exertion, cough, sputum, wheezing.  GI: See history of present illness. GU:  Negative for dysuria, hematuria, urinary incontinence, urinary frequency, nocturnal urination.  Endo: Negative for unusual weight change.    Physical Examination:   BP 133/82   Pulse 83   Temp 98.4 F (36.9 C)   Ht 5' (1.524 m)   Wt 120 lb (54.4 kg)   BMI 23.44 kg/m   General: Well-nourished, well-developed in no acute distress.  Eyes: No icterus. Conjunctivae pink. Mouth: Oropharyngeal mucosa moist and pink , no lesions erythema or exudate. Abdomen: Bowel sounds are normal, nontender, nondistended, no hepatosplenomegaly or masses, no abdominal bruits or hernia , no rebound or guarding.   Extremities: No lower extremity edema. No clubbing or deformities. Neuro: Alert and oriented x 3.  Grossly intact. Skin: Warm and dry, no jaundice.   Psych: Alert and cooperative, normal mood and affect.   Imaging Studies: No results found.  Assessment and Plan:   [name] is a 84 y.o. y/o femalehere to follow for an urgent visit for diarrhea secondary to C. difficile colitis.Marland Kitchen  She was previously started on sulfasalazine for colitis likely ulcerative colitis.    Plan 1.  Complete course of vancomycin for C. difficile diarrhea. 2.  Restart sulfasalazine for colitis which she stopped a few months back.  This needs to be taken long-term.  Will require regular monitoring of CBC. 3.  I will give her a call in a 7 to 10 days to check and see how she is doing.  If improving but not completely may need a longer course of vancomycin which I will change at the next visit.   Dr [name]  MD,MRCP [facility]) Follow up in 7 to 10 days telephone visit

## 2020-07-07 ENCOUNTER — Other Ambulatory Visit: Payer: Self-pay

## 2020-07-07 ENCOUNTER — Telehealth: Payer: Self-pay

## 2020-07-07 NOTE — Telephone Encounter (Signed)
Approved.  

## 2020-07-07 NOTE — Telephone Encounter (Signed)
[name] PT with [facility] requesting verbal orders to extend [facility] PT next week 2 x a wk for 2 wks and 1 x a wk for 2 wks. Please see 07/03/20 phone note.

## 2020-07-10 NOTE — Telephone Encounter (Signed)
Called and gave message to PT no further questions.

## 2020-07-11 ENCOUNTER — Other Ambulatory Visit: Payer: Self-pay

## 2020-07-11 ENCOUNTER — Telehealth: Payer: Self-pay

## 2020-07-11 DIAGNOSIS — A0472 Enterocolitis due to Clostridium difficile, not specified as recurrent: Secondary | ICD-10-CM

## 2020-07-11 DIAGNOSIS — U071 COVID-19: Secondary | ICD-10-CM | POA: Diagnosis not present

## 2020-07-11 DIAGNOSIS — I10 Essential (primary) hypertension: Secondary | ICD-10-CM | POA: Diagnosis not present

## 2020-07-11 DIAGNOSIS — E7800 Pure hypercholesterolemia, unspecified: Secondary | ICD-10-CM | POA: Diagnosis not present

## 2020-07-11 DIAGNOSIS — A0839 Other viral enteritis: Secondary | ICD-10-CM | POA: Diagnosis not present

## 2020-07-11 DIAGNOSIS — K5790 Diverticulosis of intestine, part unspecified, without perforation or abscess without bleeding: Secondary | ICD-10-CM | POA: Diagnosis not present

## 2020-07-11 DIAGNOSIS — J84112 Idiopathic pulmonary fibrosis: Secondary | ICD-10-CM | POA: Diagnosis not present

## 2020-07-11 NOTE — Telephone Encounter (Signed)
Was the diarrhea better when she was on the vancomycin taking 3-4 times a day vs now?

## 2020-07-12 MED ORDER — VANCOMYCIN HCL 125 MG PO CAPS: 125.0000 mg | ORAL_CAPSULE | Freq: Four times a day (QID) | ORAL | 0 refills | Status: DC

## 2020-07-12 NOTE — Telephone Encounter (Signed)
Increase dose to 4 times a day for 3 weeks, phone call with me in 7-10 days to check how she is doing

## 2020-07-13 DIAGNOSIS — I10 Essential (primary) hypertension: Secondary | ICD-10-CM | POA: Diagnosis not present

## 2020-07-13 DIAGNOSIS — A0839 Other viral enteritis: Secondary | ICD-10-CM | POA: Diagnosis not present

## 2020-07-13 DIAGNOSIS — U071 COVID-19: Secondary | ICD-10-CM | POA: Diagnosis not present

## 2020-07-13 DIAGNOSIS — J84112 Idiopathic pulmonary fibrosis: Secondary | ICD-10-CM | POA: Diagnosis not present

## 2020-07-13 DIAGNOSIS — E7800 Pure hypercholesterolemia, unspecified: Secondary | ICD-10-CM | POA: Diagnosis not present

## 2020-07-13 DIAGNOSIS — K5790 Diverticulosis of intestine, part unspecified, without perforation or abscess without bleeding: Secondary | ICD-10-CM | POA: Diagnosis not present

## 2020-07-17 ENCOUNTER — Telehealth (INDEPENDENT_AMBULATORY_CARE_PROVIDER_SITE_OTHER): Payer: Medicare Other | Admitting: Gastroenterology

## 2020-07-17 DIAGNOSIS — A0472 Enterocolitis due to Clostridium difficile, not specified as recurrent: Secondary | ICD-10-CM

## 2020-07-17 DIAGNOSIS — J84112 Idiopathic pulmonary fibrosis: Secondary | ICD-10-CM | POA: Diagnosis not present

## 2020-07-17 DIAGNOSIS — I10 Essential (primary) hypertension: Secondary | ICD-10-CM | POA: Diagnosis not present

## 2020-07-17 DIAGNOSIS — U071 COVID-19: Secondary | ICD-10-CM | POA: Diagnosis not present

## 2020-07-17 DIAGNOSIS — E7800 Pure hypercholesterolemia, unspecified: Secondary | ICD-10-CM | POA: Diagnosis not present

## 2020-07-17 DIAGNOSIS — K5790 Diverticulosis of intestine, part unspecified, without perforation or abscess without bleeding: Secondary | ICD-10-CM | POA: Diagnosis not present

## 2020-07-17 DIAGNOSIS — A0839 Other viral enteritis: Secondary | ICD-10-CM | POA: Diagnosis not present

## 2020-07-17 NOTE — Progress Notes (Signed)
[name] , MD [address]  [place]  [place], [place] [address]  Main: [phone]  Fax: [phone]   Primary Care Physician: [name], NP  Virtual Visit via Telephone Note  I connected with patient on 07/17/20 at  1:00 PM EST by telephone and verified that I am speaking with the correct person using two identifiers.   I discussed the limitations, risks, security and privacy concerns of performing an evaluation and management service by telephone and the availability of in person appointments. I also discussed with the patient that there may be a patient responsible charge related to this service. The patient expressed understanding and agreed to proceed.  Location of Patient: Home Location of Provider: Home Persons involved: Patient and provider only   History of Present Illness:   Cdiff diarrhea  HPI: [name] is a 84 y.o. female   Summary of history :  She was previously seen in 2020diarrhea after being referred by her primary care physician in January 2020. At that point of time she has been having diarrhea for over 2 months She was seen in 2018 by [facility] clinic GI  in12/2009she had a colonoscopy as per the last GI note showed chronic colitis with focal cryptitis. Focal active colitis. Not sure why it was not felt that this patient had inflammatory bowel disease. At her initial visit she had beenon Meloxicam for about 2-3 Months, 2 tablets a day and no other NSAID's. She also did have blood in her stool at that point of time.  10/13/2018: EGD: normal . SigmoidoscopyDiscontinuous areas of nonbleeding ulcerated mucosa with no stigmata of recent bleeding were present in the sigmoid colon, in thedescending colon and in the distal transverse colon. Biopsies were taken with a cold forceps for histology.Rectum was normal. Pathology report showed chronic active colitis and normal rectal biopsies. HSV and CMV negative.  In February  2021 when she came to see me she had stopped meloxicam and doing well.  Was not having any diarrhea.  09/09/2019: Sigmoidoscopy: I went all the way to her mid transverse colon. No evidence of colitis was seen. There was a single solitary ulcer in the proximal descending colon. No bleeding was seen. Biopsies were taken with forceps. And it demonstrated chronic colitis with mild to moderate activity and small crypt abscess. 09/13/2019: Vitamin D normal, hemoglobin 13.7 with a platelet count of 224 and a white cell count of 9.8. CMP was normal.  06/02/2020 CRP 0.7, CMP showed a calcium of 8.7 normal AST and ALT.  Hemoglobin on 06/01/2020 showed a normal study with normal differential count   Interval history11/10/2019-07/17/2020    She has been commenced on vancomycin on 07/03/2020 for C. difficile diarrhea.  She called our office on 07/11/2020 informed that the diarrhea was recurring when she went off the vancomycin.  I subsequently suggested her to increase her dose back to 4 times a day for 3 weeks with a plan to gradually taper off.  Still having diarrhea but lesser frequency 3 times a day.  Denies any blood in the stool.  Taking her vancomycin along with sulfasalazine.  No abdominal pain.  She feels nauseous.  Feels weak.  Current Outpatient Medications  Medication Sig Dispense Refill  . Calcium Polycarbophil 625 MG CHEW Chew 1 tablet by mouth daily.    . Cholecalciferol 25 MCG (1000 UT) capsule Take 1,000 Units by mouth daily.    . citalopram (CELEXA) 40 MG tablet Take 1 tablet (40 mg  total) by mouth daily. For anxiety. 90 tablet 1  . denosumab (PROLIA) 60 MG/ML SOSY injection Inject 60 mg into the skin every 6 (six) months.    . ESBRIET 267 MG TABS Take 6 tablets by mouth daily.     Marland Kitchen HYDROmorphone (DILAUDID) 4 MG tablet Take 4 mg by mouth 3 (three) times daily.     . OXYGEN Inhale into the lungs. 2 L at night    . sulfaSALAzine (AZULFIDINE) 500 MG tablet Take 1 tablet (500 mg total) by  mouth 4 (four) times daily. 120 tablet 5  . vancomycin (VANCOCIN) 125 MG capsule Take 1 capsule (125 mg total) by mouth 4 (four) times daily. 40 capsule 0  . vancomycin (VANCOCIN) 125 MG capsule Take 1 capsule (125 mg total) by mouth 4 (four) times daily for 21 days. 84 capsule 0  . VOLTAREN 1 % GEL   2   No current facility-administered medications for this visit.    Allergies as of 07/17/2020  . (No Known Allergies)    Review of Systems:    All systems reviewed and negative except where noted in HPI.   Observations/Objective:  Labs: CMP     Component Value Date/Time   NA 142 06/02/2020 0450   NA 143 09/16/2018 1232   NA 139 02/09/2014 0500   K 3.7 06/02/2020 0450   K 3.2 (L) 02/09/2014 0500   CL 105 06/02/2020 0450   CL 106 02/09/2014 0500   CO2 31 06/02/2020 0450   CO2 27 02/09/2014 0500   GLUCOSE 95 06/02/2020 0450   GLUCOSE 85 02/09/2014 0500   BUN 15 06/02/2020 0450   BUN 14 09/16/2018 1232   BUN 11 02/09/2014 0500   CREATININE 0.70 06/02/2020 0450   CREATININE 0.78 02/09/2014 0500   CALCIUM 8.7 (L) 06/02/2020 0450   CALCIUM 8.8 02/09/2014 0500   PROT 5.8 (L) 06/02/2020 0450   PROT 6.5 02/03/2017 0818   PROT 6.7 02/08/2014 1247   ALBUMIN 2.9 (L) 06/02/2020 0450   ALBUMIN 4.0 02/03/2017 0818   ALBUMIN 3.2 (L) 02/08/2014 1247   AST 30 06/02/2020 0450   AST 35 02/08/2014 1247   ALT 18 06/02/2020 0450   ALT 23 02/08/2014 1247   ALKPHOS 44 06/02/2020 0450   ALKPHOS 101 02/08/2014 1247   BILITOT 0.7 06/02/2020 0450   BILITOT 1.0 02/03/2017 0818   BILITOT 0.7 02/08/2014 1247   GFRNONAA >60 06/02/2020 0450   GFRNONAA >60 02/09/2014 0500   GFRAA >60 06/02/2020 0450   GFRAA >60 02/09/2014 0500   Lab Results  Component Value Date   WBC 6.6 06/01/2020   HGB 13.4 06/01/2020   HCT 37.5 06/01/2020   MCV 87.0 06/01/2020   PLT 176 06/01/2020    Imaging Studies: No results found.  Assessment and Plan:   [name] is a 84 y.o. y/o femalehere to  follow for an urgent visit for diarrhea secondary to C. difficile colitis.Marland Kitchen  She was previously started on sulfasalazine for colitis likely ulcerative colitis.  She has been on vancomycin.  I have kept her on 4 times a day extending to normal duration with a plan for a slow taper.  The frequency of bowel movements has definitely decreased but still watery in nature but nonbloody.  I explained to her the differentials at this point of time could be ongoing C. difficile diarrhea versus worsening of her colitis.    Plan 1.    Continue vancomycin 4 times a day 2.  I  will speak to her in 7 to 10 days time from today.  If no better or not showing significant improvement then may need further evaluation with a sigmoidoscopy to determine if her colitis is causing more symptoms than the C. difficile.  I have also advised her that in case she feels worse in the interim to call me right away.   I discussed the assessment and treatment plan with the patient. The patient was provided an opportunity to ask questions and all were answered. The patient agreed with the plan and demonstrated an understanding of the instructions.   The patient was advised to call back or seek an in-person evaluation if the symptoms worsen or if the condition fails to improve as anticipated.  I provided 11 minutes of non-face-to-face time during this encounter.  Dr [name] MD,MRCP [facility]) Gastroenterology/Hepatology Pager: [phone]   Speech recognition software was used to dictate this note.

## 2020-07-20 DIAGNOSIS — U071 COVID-19: Secondary | ICD-10-CM | POA: Diagnosis not present

## 2020-07-20 DIAGNOSIS — A0839 Other viral enteritis: Secondary | ICD-10-CM | POA: Diagnosis not present

## 2020-07-20 DIAGNOSIS — J84112 Idiopathic pulmonary fibrosis: Secondary | ICD-10-CM | POA: Diagnosis not present

## 2020-07-20 DIAGNOSIS — I10 Essential (primary) hypertension: Secondary | ICD-10-CM | POA: Diagnosis not present

## 2020-07-20 DIAGNOSIS — K5790 Diverticulosis of intestine, part unspecified, without perforation or abscess without bleeding: Secondary | ICD-10-CM | POA: Diagnosis not present

## 2020-07-20 DIAGNOSIS — E7800 Pure hypercholesterolemia, unspecified: Secondary | ICD-10-CM | POA: Diagnosis not present

## 2020-07-25 DIAGNOSIS — U071 COVID-19: Secondary | ICD-10-CM | POA: Diagnosis not present

## 2020-07-25 DIAGNOSIS — J84112 Idiopathic pulmonary fibrosis: Secondary | ICD-10-CM | POA: Diagnosis not present

## 2020-07-25 DIAGNOSIS — K5790 Diverticulosis of intestine, part unspecified, without perforation or abscess without bleeding: Secondary | ICD-10-CM | POA: Diagnosis not present

## 2020-07-25 DIAGNOSIS — E7800 Pure hypercholesterolemia, unspecified: Secondary | ICD-10-CM | POA: Diagnosis not present

## 2020-07-25 DIAGNOSIS — A0839 Other viral enteritis: Secondary | ICD-10-CM | POA: Diagnosis not present

## 2020-07-25 DIAGNOSIS — I10 Essential (primary) hypertension: Secondary | ICD-10-CM | POA: Diagnosis not present

## 2020-07-31 ENCOUNTER — Telehealth: Payer: Self-pay | Admitting: Gastroenterology

## 2020-07-31 NOTE — Telephone Encounter (Signed)
Yes, she needs a telephone visit - not today - any day this week

## 2020-07-31 NOTE — Telephone Encounter (Signed)
Patient wanted to know why Dr. Vicente Males has not followed up with her since the last [facility] Phone call on 07/17/2020. Patient stated that Dr. Idelle Crouch her that he would follow back with her within 7 to 10 days. Patient stated that she would like to discuss C-Diff. Per Patient.

## 2020-08-01 ENCOUNTER — Other Ambulatory Visit: Payer: Self-pay

## 2020-08-01 ENCOUNTER — Telehealth (INDEPENDENT_AMBULATORY_CARE_PROVIDER_SITE_OTHER): Payer: Medicare Other | Admitting: Gastroenterology

## 2020-08-01 DIAGNOSIS — A0472 Enterocolitis due to Clostridium difficile, not specified as recurrent: Secondary | ICD-10-CM

## 2020-08-01 DIAGNOSIS — K529 Noninfective gastroenteritis and colitis, unspecified: Secondary | ICD-10-CM | POA: Diagnosis not present

## 2020-08-01 MED ORDER — VANCOMYCIN HCL 125 MG PO CAPS
ORAL_CAPSULE | ORAL | 0 refills | Status: DC
Start: 2020-08-01 — End: 2020-08-24

## 2020-08-01 NOTE — Progress Notes (Signed)
[name] , MD [address]  [place]  [place], [place] [address]  Main: [phone]  Fax: [phone]   Primary Care Physician: [name], NP  Virtual Visit via Telephone Note  I connected with patient on 08/01/20 at  1:30 PM EST by telephone and verified that I am speaking with the correct person using two identifiers.   I discussed the limitations, risks, security and privacy concerns of performing an evaluation and management service by telephone and the availability of in person appointments. I also discussed with the patient that there may be a patient responsible charge related to this service. The patient expressed understanding and agreed to proceed.  Location of Patient: Home Location of Provider: Home Persons involved: Patient and provider only   History of Present Illness:  C diff diarrhea follow up   HPI: [name] is a 84 y.o. female   Summary of history :  She was previously seen in 2020diarrhea after being referred by her primary care physician in January 2020. At that point of time she has been having diarrhea for over 2 months She was seen in 2018 by [facility] clinic GI in12/2009she had a colonoscopy as per the last GI note showed chronic colitis with focal cryptitis. Focal active colitis. Not sure why it was not felt that this patient had inflammatory bowel disease. At her initial visit she had beenon Meloxicam for about 2-3 Months, 2 tablets a day and no other NSAID's. She also did have blood in her stool at that point of time.  10/13/2018: EGD: normal . SigmoidoscopyDiscontinuous areas of nonbleeding ulcerated mucosa with no stigmata of recent bleeding were present in the sigmoid colon, in thedescending colon and in the distal transverse colon. Biopsies were taken with a cold forceps for histology.Rectum was normal. Pathology report showed chronic active colitis and normal rectal biopsies. HSV and CMV negative.  In  February 2021 when she came to see me she hadstoppedmeloxicam and doing well.Was not having any diarrhea.  09/09/2019: Sigmoidoscopy: I went all the way to her mid transverse colon. No evidence of colitis was seen. There was a single solitary ulcer in the proximal descending colon. No bleeding was seen. Biopsies were taken with forceps. And it demonstrated chronic colitis with mild to moderate activity and small crypt abscess. 09/13/2019: Vitamin D normal, hemoglobin 13.7 with a platelet count of 224 and a white cell count of 9.8. CMP was normal.  06/02/2020 CRP 0.7, CMP showed a calcium of 8.7 normal AST and ALT. Hemoglobin on 06/01/2020 showed a normal study with normal differential count  She has been commenced on vancomycin on 07/03/2020 for C. difficile diarrhea.  She called our office on 07/11/2020 informed that the diarrhea was recurring when she went off the vancomycin.  I subsequently suggested her to increase her dose back to 4 times a day for 3 weeks with a plan to gradually taper off.  Interval history11/15/2021-08/01/2020  Diarrhea better , stools soft and not watery . On sulfasalazine, taking vacomycin QID, Feels very weak.    Current Outpatient Medications  Medication Sig Dispense Refill  . Calcium Polycarbophil 625 MG CHEW Chew 1 tablet by mouth daily.    . Cholecalciferol 25 MCG (1000 UT) capsule Take 1,000 Units by mouth daily.    . citalopram (CELEXA) 40 MG tablet Take 1 tablet (40 mg total) by mouth daily. For anxiety. 90 tablet 1  . denosumab (PROLIA) 60 MG/ML SOSY injection Inject 60 mg into the  skin every 6 (six) months.    . ESBRIET 267 MG TABS Take 6 tablets by mouth daily.     Marland Kitchen HYDROmorphone (DILAUDID) 4 MG tablet Take 4 mg by mouth 3 (three) times daily.     . OXYGEN Inhale into the lungs. 2 L at night    . sulfaSALAzine (AZULFIDINE) 500 MG tablet Take 1 tablet (500 mg total) by mouth 4 (four) times daily. 120 tablet 5  . vancomycin (VANCOCIN) 125 MG  capsule Take 1 capsule (125 mg total) by mouth 4 (four) times daily. 40 capsule 0  . vancomycin (VANCOCIN) 125 MG capsule Take 1 capsule (125 mg total) by mouth 4 (four) times daily for 21 days. 84 capsule 0  . VOLTAREN 1 % GEL   2   No current facility-administered medications for this visit.    Allergies as of 08/01/2020  . (No Known Allergies)    Review of Systems:    All systems reviewed and negative except where noted in HPI.   Observations/Objective:  Labs: CMP     Component Value Date/Time   NA 142 06/02/2020 0450   NA 143 09/16/2018 1232   NA 139 02/09/2014 0500   K 3.7 06/02/2020 0450   K 3.2 (L) 02/09/2014 0500   CL 105 06/02/2020 0450   CL 106 02/09/2014 0500   CO2 31 06/02/2020 0450   CO2 27 02/09/2014 0500   GLUCOSE 95 06/02/2020 0450   GLUCOSE 85 02/09/2014 0500   BUN 15 06/02/2020 0450   BUN 14 09/16/2018 1232   BUN 11 02/09/2014 0500   CREATININE 0.70 06/02/2020 0450   CREATININE 0.78 02/09/2014 0500   CALCIUM 8.7 (L) 06/02/2020 0450   CALCIUM 8.8 02/09/2014 0500   PROT 5.8 (L) 06/02/2020 0450   PROT 6.5 02/03/2017 0818   PROT 6.7 02/08/2014 1247   ALBUMIN 2.9 (L) 06/02/2020 0450   ALBUMIN 4.0 02/03/2017 0818   ALBUMIN 3.2 (L) 02/08/2014 1247   AST 30 06/02/2020 0450   AST 35 02/08/2014 1247   ALT 18 06/02/2020 0450   ALT 23 02/08/2014 1247   ALKPHOS 44 06/02/2020 0450   ALKPHOS 101 02/08/2014 1247   BILITOT 0.7 06/02/2020 0450   BILITOT 1.0 02/03/2017 0818   BILITOT 0.7 02/08/2014 1247   GFRNONAA >60 06/02/2020 0450   GFRNONAA >60 02/09/2014 0500   GFRAA >60 06/02/2020 0450   GFRAA >60 02/09/2014 0500   Lab Results  Component Value Date   WBC 6.6 06/01/2020   HGB 13.4 06/01/2020   HCT 37.5 06/01/2020   MCV 87.0 06/01/2020   PLT 176 06/01/2020    Imaging Studies: No results found.  Assessment and Plan:   [name] is a 84 y.o. y/o femalehere to followfor an urgent visit for diarrheasecondary to C. difficile colitis.Marland Kitchen  She was previously started on sulfasalazine for colitis likely ulcerative colitis.  She has been on vancomycin.  I have kept her on 4 times a day extending to normal duration with a plan for a slow taper.  The frequency of bowel movements has definitely decreased but still watery in nature but nonbloody.  I explained to her the differentials at this point of time could be ongoing C. difficile diarrhea versus worsening of her colitis.    Plan 1.Continue vancomycin 4 times a day for another week, then decrease to TID for 10 days, then BID for 10 and continue till re assessed   2. Check labs CBC,CMP,CRP.  3. Flexible sigmoidoscopy unsedated to assess  colitis status  4. Follow up phone call in 1 week    I have discussed alternative options, risks & benefits,  which include, but are not limited to, bleeding, infection, perforation,respiratory complication & drug reaction.  The patient agrees with this plan & written consent will be obtained.      I discussed the assessment and treatment plan with the patient. The patient was provided an opportunity to ask questions and all were answered. The patient agreed with the plan and demonstrated an understanding of the instructions.   The patient was advised to call back or seek an in-person evaluation if the symptoms worsen or if the condition fails to improve as anticipated.  I provided 15 minutes of non-face-to-face time during this encounter.  Dr Terri Bellows MD,MRCP [facility]) Gastroenterology/Hepatology Pager: [phone]   Speech recognition software was used to dictate this note.

## 2020-08-01 NOTE — Telephone Encounter (Signed)
Patient calling back again very upset she still has not heard anything. I saw this note and scheduled pt for phone visit today 11.30.21 @1 :30PM. FYI

## 2020-08-02 ENCOUNTER — Other Ambulatory Visit
Admission: RE | Admit: 2020-08-02 | Discharge: 2020-08-02 | Disposition: A | Discharge status: Home / Self Care | Payer: Medicare Other | Source: Ambulatory Visit | Attending: Gastroenterology | Admitting: Gastroenterology

## 2020-08-02 ENCOUNTER — Other Ambulatory Visit: Payer: Self-pay

## 2020-08-02 DIAGNOSIS — Z20822 Contact with and (suspected) exposure to covid-19: Secondary | ICD-10-CM | POA: Insufficient documentation

## 2020-08-02 DIAGNOSIS — E7800 Pure hypercholesterolemia, unspecified: Secondary | ICD-10-CM | POA: Diagnosis not present

## 2020-08-02 DIAGNOSIS — A0839 Other viral enteritis: Secondary | ICD-10-CM | POA: Diagnosis not present

## 2020-08-02 DIAGNOSIS — J84112 Idiopathic pulmonary fibrosis: Secondary | ICD-10-CM | POA: Diagnosis not present

## 2020-08-02 DIAGNOSIS — Z01812 Encounter for preprocedural laboratory examination: Secondary | ICD-10-CM | POA: Insufficient documentation

## 2020-08-02 DIAGNOSIS — I10 Essential (primary) hypertension: Secondary | ICD-10-CM | POA: Diagnosis not present

## 2020-08-02 DIAGNOSIS — K5790 Diverticulosis of intestine, part unspecified, without perforation or abscess without bleeding: Secondary | ICD-10-CM | POA: Diagnosis not present

## 2020-08-02 DIAGNOSIS — U071 COVID-19: Secondary | ICD-10-CM | POA: Diagnosis not present

## 2020-08-02 LAB — SARS CORONAVIRUS 2 (TAT 6-24 HRS): SARS Coronavirus 2: NEGATIVE

## 2020-08-02 LAB — CBC
Hematocrit: 36.3 % (ref 34.0–46.6)
Hemoglobin: 12.4 g/dL (ref 11.1–15.9)
MCH: 30.8 pg (ref 26.6–33.0)
MCHC: 34.2 g/dL (ref 31.5–35.7)
MCV: 90 fL (ref 79–97)
Platelets: 287 10*3/uL (ref 150–450)
RBC: 4.02 x10E6/uL (ref 3.77–5.28)
RDW: 13 % (ref 11.7–15.4)
WBC: 7 10*3/uL (ref 3.4–10.8)

## 2020-08-02 LAB — COMPREHENSIVE METABOLIC PANEL
ALT: 17 IU/L (ref 0–32)
AST: 34 IU/L (ref 0–40)
Albumin/Globulin Ratio: 1.4 (ref 1.2–2.2)
Albumin: 3.9 g/dL (ref 3.6–4.6)
Alkaline Phosphatase: 80 IU/L (ref 44–121)
BUN/Creatinine Ratio: 23 (ref 12–28)
BUN: 16 mg/dL (ref 8–27)
Bilirubin Total: 0.4 mg/dL (ref 0.0–1.2)
CO2: 22 mmol/L (ref 20–29)
Calcium: 9.4 mg/dL (ref 8.7–10.3)
Chloride: 101 mmol/L (ref 96–106)
Creatinine, Ser: 0.69 mg/dL (ref 0.57–1.00)
GFR calc Af Amer: 94 mL/min/{1.73_m2} (ref >59)
GFR calc non Af Amer: 81 mL/min/{1.73_m2} (ref >59)
Globulin, Total: 2.8 g/dL (ref 1.5–4.5)
Glucose: 115 mg/dL — ABNORMAL HIGH (ref 65–99)
Potassium: 4.2 mmol/L (ref 3.5–5.2)
Sodium: 139 mmol/L (ref 134–144)
Total Protein: 6.7 g/dL (ref 6.0–8.5)

## 2020-08-02 LAB — C-REACTIVE PROTEIN: CRP: <1 mg/L (ref 0–10)

## 2020-08-03 ENCOUNTER — Encounter: Payer: Self-pay | Admitting: Gastroenterology

## 2020-08-04 ENCOUNTER — Encounter
Admission: RE | Disposition: A | Discharge status: Home / Self Care | Payer: Self-pay | Source: Home / Self Care | Attending: Gastroenterology

## 2020-08-04 ENCOUNTER — Other Ambulatory Visit: Payer: Self-pay

## 2020-08-04 ENCOUNTER — Ambulatory Visit: Payer: Medicare Other | Admitting: Anesthesiology

## 2020-08-04 ENCOUNTER — Ambulatory Visit
Admission: RE | Admit: 2020-08-04 | Discharge: 2020-08-04 | Disposition: A | Discharge status: Home / Self Care | Payer: Medicare Other | Attending: Gastroenterology | Admitting: Gastroenterology

## 2020-08-04 ENCOUNTER — Encounter: Payer: Self-pay | Admitting: Gastroenterology

## 2020-08-04 DIAGNOSIS — K529 Noninfective gastroenteritis and colitis, unspecified: Secondary | ICD-10-CM | POA: Insufficient documentation

## 2020-08-04 DIAGNOSIS — I10 Essential (primary) hypertension: Secondary | ICD-10-CM | POA: Diagnosis not present

## 2020-08-04 DIAGNOSIS — Z96612 Presence of left artificial shoulder joint: Secondary | ICD-10-CM | POA: Diagnosis not present

## 2020-08-04 DIAGNOSIS — K5790 Diverticulosis of intestine, part unspecified, without perforation or abscess without bleeding: Secondary | ICD-10-CM | POA: Diagnosis not present

## 2020-08-04 DIAGNOSIS — E559 Vitamin D deficiency, unspecified: Secondary | ICD-10-CM | POA: Diagnosis not present

## 2020-08-04 DIAGNOSIS — Z79899 Other long term (current) drug therapy: Secondary | ICD-10-CM | POA: Diagnosis not present

## 2020-08-04 DIAGNOSIS — Z09 Encounter for follow-up examination after completed treatment for conditions other than malignant neoplasm: Secondary | ICD-10-CM | POA: Insufficient documentation

## 2020-08-04 DIAGNOSIS — K5730 Diverticulosis of large intestine without perforation or abscess without bleeding: Secondary | ICD-10-CM | POA: Insufficient documentation

## 2020-08-04 DIAGNOSIS — Z1211 Encounter for screening for malignant neoplasm of colon: Secondary | ICD-10-CM | POA: Diagnosis not present

## 2020-08-04 DIAGNOSIS — E7800 Pure hypercholesterolemia, unspecified: Secondary | ICD-10-CM | POA: Diagnosis not present

## 2020-08-04 HISTORY — PX: FLEXIBLE SIGMOIDOSCOPY: SHX5431

## 2020-08-04 SURGERY — SIGMOIDOSCOPY, FLEXIBLE
Anesthesia: General

## 2020-08-04 MED ORDER — PROPOFOL 500 MG/50ML IV EMUL
INTRAVENOUS | Status: DC | PRN
  Administered 2020-08-04: 150 ug/kg/min via INTRAVENOUS

## 2020-08-04 MED ORDER — LIDOCAINE 2% (20 MG/ML) 5 ML SYRINGE
INTRAMUSCULAR | Status: DC | PRN
  Administered 2020-08-04: 50 mg via INTRAVENOUS

## 2020-08-04 MED ORDER — PROPOFOL 10 MG/ML IV BOLUS
INTRAVENOUS | Status: DC | PRN
  Administered 2020-08-04: 50 mg via INTRAVENOUS

## 2020-08-04 MED ORDER — SODIUM CHLORIDE 0.9 % IV SOLN: INTRAVENOUS | Status: DC

## 2020-08-04 NOTE — Transfer of Care (Signed)
Immediate Anesthesia Transfer of Care Note  Patient: [name]  Procedure(s) Performed: FLEXIBLE SIGMOIDOSCOPY (N/A )  Patient Location: Endoscopy Unit  Anesthesia Type:General  Level of Consciousness: sedated  Airway & Oxygen Therapy: Patient Spontanous Breathing  Post-op Assessment: Post -op Vital signs reviewed and stable  Post vital signs: stable  Last Vitals:  Vitals Value Taken Time  BP 95/57   Temp    Pulse 73 08/04/20 1102  Resp 29 08/04/20 1102  SpO2 100 % 08/04/20 1102  Vitals shown include unvalidated device data.  Last Pain:  Vitals:   08/04/20 0957  TempSrc: Temporal  PainSc: 0-No pain         Complications: No complications documented.

## 2020-08-04 NOTE — Anesthesia Postprocedure Evaluation (Signed)
Anesthesia Post Note  Patient: KATALIN COLLEDGE  Procedure(s) Performed: FLEXIBLE SIGMOIDOSCOPY (N/A )  Patient location during evaluation: Endoscopy Anesthesia Type: General Level of consciousness: awake and alert Pain management: pain level controlled Vital Signs Assessment: post-procedure vital signs reviewed and stable Respiratory status: spontaneous breathing, nonlabored ventilation, respiratory function stable and patient connected to nasal cannula oxygen Cardiovascular status: blood pressure returned to baseline and stable Postop Assessment: no apparent nausea or vomiting Anesthetic complications: no   No complications documented.   Last Vitals:  Vitals:   08/04/20 1103 08/04/20 1133  BP: (!) 95/57 (!) 142/61  Pulse:  64  Resp:  (!) 29  Temp: (!) 36.4 C   SpO2:  100%    Last Pain:  Vitals:   08/04/20 1123  TempSrc:   PainSc: 0-No pain                 Martha Clan

## 2020-08-04 NOTE — Op Note (Signed)
[facility] Gastroenterology Patient Name: [name] Procedure Date: 08/04/2020 10:47 AM MRN: [id number] Account #: [id number] Date of Birth: 08/08/1936 Admit Type: Outpatient Age: 84 Room: [facility] ENDO ROOM 4 Gender: Female Note Status: Finalized Procedure:             Flexible Sigmoidoscopy Indications:           Follow-up of chronic ulcerative proctosigmoiditis Providers:             [name] MD, MD Referring MD:          No Local Md, MD (Referring MD) Medicines:             Monitored Anesthesia Care Complications:         No immediate complications. Procedure:             Pre-Anesthesia Assessment:                        - Prior to the procedure, a History and Physical was                         performed, and patient medications, allergies and                         sensitivities were reviewed. The patient's tolerance                         of previous anesthesia was reviewed.                        - The risks and benefits of the procedure and the                         sedation options and risks were discussed with the                         patient. All questions were answered and informed                         consent was obtained.                        - ASA Grade Assessment: II - A patient with mild                         systemic disease.                        After obtaining informed consent, the scope was passed                         under direct vision. The Colonoscope was introduced                         through the anus and advanced to the the left                         transverse colon. The flexible sigmoidoscopy was                         accomplished  with ease. The patient tolerated the                         procedure well. The quality of the bowel preparation                         was excellent. Findings:      The perianal and digital rectal examinations were normal.      The colon (entire examined portion) appeared  normal. Biopsies were taken       with a cold forceps for histology.      Multiple small-mouthed diverticula were found in the sigmoid colon. Impression:            - The entire examined colon is normal. Biopsied. Recommendation:        - Discharge patient to home (with escort).                        - Resume previous diet.                        - Await pathology results.                        - Return to my office as previously scheduled. Procedure Code(s):     --- Professional ---                        [phone], Sigmoidoscopy, flexible; with biopsy, single or                         multiple Diagnosis Code(s):     --- Professional ---                        K51.30, Ulcerative (chronic) rectosigmoiditis without                         complications CPT copyright 2019 American Medical Association. All rights reserved. The codes documented in this report are preliminary and upon coder review may  be revised to meet current compliance requirements. [name], MD [name] MD, MD 08/04/2020 11:00:45 AM This report has been signed electronically. Number of Addenda: 0 Note Initiated On: 08/04/2020 10:47 AM Total Procedure Duration: 0 hours 5 minutes 26 seconds  Estimated Blood Loss:  Estimated blood loss: none.      [facility]

## 2020-08-04 NOTE — Anesthesia Preprocedure Evaluation (Signed)
Anesthesia Evaluation  Patient identified by MRN, date of birth, ID band Patient awake    Reviewed: Allergy & Precautions, H&P , NPO status , Patient's Chart, lab work & pertinent test results  History of Anesthesia Complications Negative for: history of anesthetic complications  Airway Mallampati: III  TM Distance: <3 FB Neck ROM: limited    Dental  (+) Chipped, Poor Dentition   Pulmonary shortness of breath and Long-Term Oxygen Therapy, sleep apnea , pneumonia, neg COPD,           Cardiovascular Exercise Tolerance: Good hypertension, (-) angina(-) Past MI and (-) DOE (-) dysrhythmias (-) Valvular Problems/Murmurs     Neuro/Psych PSYCHIATRIC DISORDERS Anxiety negative neurological ROS     GI/Hepatic negative GI ROS, Neg liver ROS, neg GERD  ,  Endo/Other  negative endocrine ROS  Renal/GU negative Renal ROS  negative genitourinary   Musculoskeletal  (+) Arthritis ,   Abdominal   Peds  Hematology negative hematology ROS (+)   Anesthesia Other Findings Past Medical History: No date: Altered mental state No date: Anxiety No date: BMI 30.0-30.9,adult No date: Cellulitis No date: Chest pain, atypical No date: Compression fracture of L4 lumbar vertebra No date: Constipation No date: Cystitis No date: Cystocele No date: Diverticulosis No date: Fatigue No date: Fibrosis, idiopathic pulmonary (HCC) No date: Gastroenteritis No date: History of back surgery No date: History of herniated intervertebral disc No date: HTN (hypertension) No date: Hypercholesteremia No date: Hypocalcemia No date: Incontinence of urine No date: OA (osteoarthritis)     Comment:  shoulder and back No date: Obesity No date: Osteopenia No date: Pneumonia No date: Shortness of breath No date: Sleep apnea No date: Thrush No date: Vitamin D deficiency  Past Surgical History: No date: BACK SURGERY 04/24/2016: CATARACT EXTRACTION  W/PHACO; Left     Comment:  Procedure: CATARACT EXTRACTION PHACO AND INTRAOCULAR               LENS PLACEMENT (IOC);  Surgeon: Leandrew Koyanagi, MD;               Location: [place];  Service: Ophthalmology;                Laterality: Left; 07/14/2017: CATARACT EXTRACTION W/PHACO; Right     Comment:  Procedure: CATARACT EXTRACTION PHACO AND INTRAOCULAR               LENS PLACEMENT ([place])  RIGHT;  Surgeon: Leandrew Koyanagi, MD;  Location: [place];  Service:               Ophthalmology;  Laterality: Right; No date: CHOLECYSTECTOMY 10/13/2018: ESOPHAGOGASTRODUODENOSCOPY (EGD) WITH PROPOFOL; N/A     Comment:  Procedure: ESOPHAGOGASTRODUODENOSCOPY (EGD) WITH               PROPOFOL;  Surgeon: [name], MD;  Location: [facility]               ENDOSCOPY;  Service: Gastroenterology;  Laterality: N/A; 10/13/2018: FLEXIBLE SIGMOIDOSCOPY; N/A     Comment:  Procedure: FLEXIBLE SIGMOIDOSCOPY;  Surgeon: [name], MD;  Location: [facility] ENDOSCOPY;  Service:               Gastroenterology;  Laterality: N/A; No date: ROTATOR CUFF REPAIR; Bilateral     Comment:  left-12/2009; right-03/2009 dr.califf No date: TONSILLECTOMY No date:  TOTAL SHOULDER REPLACEMENT; Left     Reproductive/Obstetrics negative OB ROS                             Anesthesia Physical  Anesthesia Plan  ASA: IV  Anesthesia Plan: General   Post-op Pain Management:    Induction: Intravenous  PONV Risk Score and Plan: Propofol infusion and TIVA  Airway Management Planned: Natural Airway and Nasal Cannula  Additional Equipment:   Intra-op Plan:   Post-operative Plan:   Informed Consent: I have reviewed the patients History and Physical, chart, labs and discussed the procedure including the risks, benefits and alternatives for the proposed anesthesia with the patient or authorized representative who has indicated his/her understanding and  acceptance.     Dental Advisory Given  Plan Discussed with: Anesthesiologist, CRNA and Surgeon  Anesthesia Plan Comments: (Patient consented for risks of anesthesia including but not limited to:  - adverse reactions to medications - risk of intubation if required - damage to teeth, lips or other oral mucosa - sore throat or hoarseness - Damage to heart, brain, lungs or loss of life  Patient voiced understanding.)        Anesthesia Quick Evaluation

## 2020-08-04 NOTE — H&P (Signed)
[name], MD [address] [place], [place], [place], [address] [address], [place], [place], [place], [address] Phone: [phone]  Fax: [phone]  Primary Care Physician:  [name], NP   Pre-Procedure History & Physical: HPI:  [name] is a 84 y.o. female is here for a sigmoidoscopy.   Past Medical History:  Diagnosis Date  . Altered mental state   . Anxiety   . BMI 30.0-30.9,adult   . Cellulitis   . Chest pain, atypical   . Compression fracture of L4 lumbar vertebra   . Constipation   . Cystitis   . Cystocele   . Diverticulosis   . Fatigue   . Fibrosis, idiopathic pulmonary ([place])   . Gastroenteritis   . History of back surgery   . History of herniated intervertebral disc   . HTN (hypertension)   . Hypercholesteremia   . Hypocalcemia   . Incontinence of urine   . OA (osteoarthritis)    shoulder and back  . Obesity   . Osteopenia   . Pneumonia   . Shortness of breath   . Sleep apnea   . Thrush   . Vitamin D deficiency     Past Surgical History:  Procedure Laterality Date  . BACK SURGERY    . CATARACT EXTRACTION W/PHACO Left 04/24/2016   Procedure: CATARACT EXTRACTION PHACO AND INTRAOCULAR LENS PLACEMENT (IOC);  Surgeon: Leandrew Koyanagi, MD;  Location: [place];  Service: Ophthalmology;  Laterality: Left;  . CATARACT EXTRACTION W/PHACO Right 07/14/2017   Procedure: CATARACT EXTRACTION PHACO AND INTRAOCULAR LENS PLACEMENT ([place])  RIGHT;  Surgeon: Leandrew Koyanagi, MD;  Location: [place];  Service: Ophthalmology;  Laterality: Right;  . CHOLECYSTECTOMY    . ESOPHAGOGASTRODUODENOSCOPY (EGD) WITH PROPOFOL N/A 10/13/2018   Procedure: ESOPHAGOGASTRODUODENOSCOPY (EGD) WITH PROPOFOL;  Surgeon: [name], MD;  Location: [facility] ENDOSCOPY;  Service: Gastroenterology;  Laterality: N/A;  . FLEXIBLE SIGMOIDOSCOPY N/A 10/13/2018   Procedure: FLEXIBLE SIGMOIDOSCOPY;  Surgeon: [name], MD;  Location: [facility]  ENDOSCOPY;  Service: Gastroenterology;  Laterality: N/A;  . FLEXIBLE SIGMOIDOSCOPY N/A 09/09/2019   Procedure: FLEXIBLE SIGMOIDOSCOPY;  Surgeon: [name], MD;  Location: [facility] ENDOSCOPY;  Service: Gastroenterology;  Laterality: N/A;  . ROTATOR CUFF REPAIR Bilateral    left-12/2009; right-03/2009 dr.califf  . TONSILLECTOMY    . TOTAL SHOULDER REPLACEMENT Left     Prior to Admission medications   Medication Sig Start Date End Date Taking? Authorizing Provider  citalopram (CELEXA) 40 MG tablet Take 1 tablet (40 mg total) by mouth daily. For anxiety. 03/15/20  Yes [name], NP  ESBRIET 267 MG TABS Take 6 tablets by mouth daily.  02/25/17  Yes [provider]  HYDROmorphone (DILAUDID) 4 MG tablet Take 4 mg by mouth 3 (three) times daily.  04/23/19  Yes [provider]  sulfaSALAzine (AZULFIDINE) 500 MG tablet Take 1 tablet (500 mg total) by mouth 4 (four) times daily. 07/04/20 12/31/20 Yes [name], MD  vancomycin (VANCOCIN) 125 MG capsule 4 times daily for 7 days; then decrease to 3 times daily for 10 days; then decrease to 2 times daily for 10 days 08/01/20  Yes [name], MD  Calcium Polycarbophil 625 MG CHEW Chew 1 tablet by mouth daily.    [provider]  Cholecalciferol 25 MCG (1000 UT) capsule Take 1,000 Units by mouth daily.    [provider]  denosumab (PROLIA) 60 MG/ML SOSY injection Inject 60 mg into the skin every 6 (  six) months.    [provider]  OXYGEN Inhale into the lungs. 2 L at night    [provider]  VOLTAREN 1 % GEL  12/23/17   [provider]    Allergies as of 08/01/2020  . (No Known Allergies)    Family History  Problem Relation Age of Onset  . COPD Mother   . Cancer Brother        colon    Social History   Socioeconomic History  . Marital status: Married    Spouse name: [name]  . Number of children: 2  . Years of education: Not on file  . Highest education level: Not on file    Occupational History  . Not on file  Tobacco Use  . Smoking status: Never Smoker  . Smokeless tobacco: Never Used  Vaping Use  . Vaping Use: Never used  Substance and Sexual Activity  . Alcohol use: No  . Drug use: No  . Sexual activity: Never    Birth control/protection: None  Other Topics Concern  . Not on file  Social History Narrative  . Not on file   Social Determinants of Health   Financial Resource Strain:   . Difficulty of Paying Living Expenses: Not on file  Food Insecurity:   . Worried About Charity fundraiser in the Last Year: Not on file  . Ran Out of Food in the Last Year: Not on file  Transportation Needs:   . Lack of Transportation (Medical): Not on file  . Lack of Transportation (Non-Medical): Not on file  Physical Activity:   . Days of Exercise per Week: Not on file  . Minutes of Exercise per Session: Not on file  Stress:   . Feeling of Stress : Not on file  Social Connections:   . Frequency of Communication with Friends and Family: Not on file  . Frequency of Social Gatherings with Friends and Family: Not on file  . Attends Religious Services: Not on file  . Active Member of Clubs or Organizations: Not on file  . Attends Archivist Meetings: Not on file  . Marital Status: Not on file  Intimate Partner Violence:   . Fear of Current or Ex-Partner: Not on file  . Emotionally Abused: Not on file  . Physically Abused: Not on file  . Sexually Abused: Not on file    Review of Systems: See HPI, otherwise negative ROS  Physical Exam: BP (!) 126/92   Pulse 87   Temp 97.8 F (36.6 C) (Temporal)   Resp 16   Ht 5' (1.524 m)   Wt 54.4 kg   SpO2 100%   BMI 23.44 kg/m  General:   Alert,  pleasant and cooperative in NAD Head:  Normocephalic and atraumatic. Neck:  Supple; no masses or thyromegaly. Lungs:  Clear throughout to auscultation, normal respiratory effort.    Heart:  +S1, +S2, Regular rate and rhythm, No edema. Abdomen:  Soft,  nontender and nondistended. Normal bowel sounds, without guarding, and without rebound.   Neurologic:  Alert and  oriented x4;  grossly normal neurologically.  Impression/Plan: [name] is here for an colonoscopy to be performed for  Sigmoidoscopy . Risks, benefits, limitations, and alternatives regarding  colonoscopy have been reviewed with the patient.  Questions have been answered.  All parties agreeable.   [name], MD  08/04/2020, 10:39 AM

## 2020-08-07 ENCOUNTER — Encounter: Payer: Self-pay | Admitting: Gastroenterology

## 2020-08-07 LAB — SURGICAL PATHOLOGY

## 2020-08-07 NOTE — Progress Notes (Signed)
Inform has very mild colitis on bx- telephone visit in 2-3 weeks

## 2020-08-08 ENCOUNTER — Telehealth: Payer: Self-pay | Admitting: Primary Care

## 2020-08-08 NOTE — Telephone Encounter (Signed)
Called patient she had questions about dates of injections. I have given all information. She did have question about shingles but will ask at virtual later in the week. I have made note in the app so that we ask her at time of visit.

## 2020-08-08 NOTE — Telephone Encounter (Signed)
Patient is wanting a call back to discuss her covid Vaccines. EM

## 2020-08-09 ENCOUNTER — Telehealth: Payer: Self-pay

## 2020-08-09 ENCOUNTER — Encounter: Payer: Medicare Other | Admitting: Obstetrics and Gynecology

## 2020-08-09 NOTE — Telephone Encounter (Signed)
Pt has been notified of results and Dr. Georgeann Oppenheim recommendations.

## 2020-08-09 NOTE — Telephone Encounter (Signed)
-----   Message from [name], MD sent at 08/07/2020 11:44 AM EST ----- Inform has very mild colitis on bx- telephone visit in 2-3 weeks

## 2020-08-10 ENCOUNTER — Other Ambulatory Visit: Payer: Self-pay

## 2020-08-10 ENCOUNTER — Encounter: Payer: Self-pay | Admitting: Primary Care

## 2020-08-10 ENCOUNTER — Telehealth: Payer: Medicare Other | Admitting: Primary Care

## 2020-08-10 ENCOUNTER — Telehealth (INDEPENDENT_AMBULATORY_CARE_PROVIDER_SITE_OTHER): Payer: Medicare Other | Admitting: Primary Care

## 2020-08-10 DIAGNOSIS — J069 Acute upper respiratory infection, unspecified: Secondary | ICD-10-CM | POA: Diagnosis not present

## 2020-08-10 MED ORDER — BENZONATATE 200 MG PO CAPS: 200.0000 mg | ORAL_CAPSULE | Freq: Three times a day (TID) | ORAL | 0 refills | Status: DC | PRN

## 2020-08-10 NOTE — Assessment & Plan Note (Signed)
Acute URI symptoms x 4-5 days, about the same.  No fevers.   Is on oral vancomycin for c-diff, follows with GI. Symptoms don't seem to be bacterial at this point. She sounds stable via phone, no cough during entire visit.  Will continue with conservative treatment.  Rx for Ladona Ridgel sent to pharmacy.  Return precautions provided. She will update in a few days.

## 2020-08-10 NOTE — Patient Instructions (Signed)
You may take Benzonatate capsules for cough. Take 1 capsule by mouth three times daily as needed for cough.  Please update me Monday if no improvement in symptoms, or sooner if symptoms progress.  It was a pleasure to see you today! [name], NP-

## 2020-08-10 NOTE — Progress Notes (Signed)
Subjective:    Patient ID: [name], female    DOB: [birth year], 84 y.o.   MRN: [id number]  HPI  Virtual Visit via Video Note  I connected with [name] on 08/10/20 at  9:40 AM EST by a video enabled telemedicine application and verified that I am speaking with the correct person using two identifiers.  Location: Patient: Home Provider: Office Participants: Patient and myself   I discussed the limitations of evaluation and management by telemedicine and the availability of in person appointments. The patient expressed understanding and agreed to proceed.  We attempted to connect via video but she could not get her camera to enable. Our visit via phone lasted 15 min and 37 min.  History of Present Illness:  [name] is a 84 year old female with a history of hypertension, sleep apnea, post-inflammatory pulmonary fibrosis, anxiety, c-diff infection, Covid-19 infection who presents today with a chief complaint of cough.  Her cough began about 4-5 days ago. She's been taking OTC "cough DM" for 2-3 days with temporary improvement.   She denies chills, body aches, nasal congestion, post nasal drip. She is recovering from the sigmoidostomy on 08/04/20, still feels weak from this procedure. She had Covid-19 infection in September 2021, hospitalized.    Observations/Objective:  Alert and oriented. No distress. Speaking in complete sentences. No cough during visit.   Assessment and Plan:  Acute URI symptoms x 4-5 days, about the same.  No fevers.   Is on oral vancomycin for c-diff, follows with GI. Symptoms don't seem to be bacterial at this point. She sounds stable via phone, no cough during entire visit.  Will continue with conservative treatment.  Rx for [name] sent to pharmacy.  Return precautions provided. She will update in a few days.  Follow Up Instructions:  You may take Benzonatate capsules for cough. Take 1 capsule by mouth three  times daily as needed for cough.  Please update me Monday if no improvement in symptoms, or sooner if symptoms progress.  It was a pleasure to see you today! Terri Bossier, NP-C    I discussed the assessment and treatment plan with the patient. The patient was provided an opportunity to ask questions and all were answered. The patient agreed with the plan and demonstrated an understanding of the instructions.   The patient was advised to call back or seek an in-person evaluation if the symptoms worsen or if the condition fails to improve as anticipated.    [name], NP    Review of Systems  Constitutional: Negative for fever.  HENT: Positive for congestion. Negative for ear pain, postnasal drip and sinus pressure.   Respiratory: Positive for cough. Negative for shortness of breath.        Past Medical History:  Diagnosis Date  . Altered mental state   . Anxiety   . BMI 30.0-30.9,adult   . Cellulitis   . Chest pain, atypical   . Compression fracture of L4 lumbar vertebra   . Constipation   . Cystitis   . Cystocele   . Diverticulosis   . Fatigue   . Fibrosis, idiopathic pulmonary ([place])   . Gastroenteritis   . History of back surgery   . History of herniated intervertebral disc   . HTN (hypertension)   . Hypercholesteremia   . Hypocalcemia   . Incontinence of urine   . OA (osteoarthritis)    shoulder and back  . Obesity   . Osteopenia   .  Pneumonia   . Shortness of breath   . Sleep apnea   . Thrush   . Vitamin D deficiency      Social History   Socioeconomic History  . Marital status: Married    Spouse name: [name]  . Number of children: 2  . Years of education: Not on file  . Highest education level: Not on file  Occupational History  . Not on file  Tobacco Use  . Smoking status: Never Smoker  . Smokeless tobacco: Never Used  Vaping Use  . Vaping Use: Never used  Substance and Sexual Activity  . Alcohol use: No  . Drug use: No  . Sexual  activity: Never    Birth control/protection: None  Other Topics Concern  . Not on file  Social History Narrative  . Not on file   Social Determinants of Health   Financial Resource Strain: Not on file  Food Insecurity: Not on file  Transportation Needs: Not on file  Physical Activity: Not on file  Stress: Not on file  Social Connections: Not on file  Intimate Partner Violence: Not on file    Past Surgical History:  Procedure Laterality Date  . BACK SURGERY    . CATARACT EXTRACTION W/PHACO Left 04/24/2016   Procedure: CATARACT EXTRACTION PHACO AND INTRAOCULAR LENS PLACEMENT (IOC);  Surgeon: Leandrew Koyanagi, MD;  Location: [place];  Service: Ophthalmology;  Laterality: Left;  . CATARACT EXTRACTION W/PHACO Right 07/14/2017   Procedure: CATARACT EXTRACTION PHACO AND INTRAOCULAR LENS PLACEMENT ([place])  RIGHT;  Surgeon: Leandrew Koyanagi, MD;  Location: [place];  Service: Ophthalmology;  Laterality: Right;  . CHOLECYSTECTOMY    . ESOPHAGOGASTRODUODENOSCOPY (EGD) WITH PROPOFOL N/A 10/13/2018   Procedure: ESOPHAGOGASTRODUODENOSCOPY (EGD) WITH PROPOFOL;  Surgeon: [name], MD;  Location: [facility] ENDOSCOPY;  Service: Gastroenterology;  Laterality: N/A;  . FLEXIBLE SIGMOIDOSCOPY N/A 10/13/2018   Procedure: FLEXIBLE SIGMOIDOSCOPY;  Surgeon: [name], MD;  [facility] ENDOSCOPY;  Service: Gastroenterology;  Laterality: N/A;  . FLEXIBLE SIGMOIDOSCOPY N/A 09/09/2019   Procedure: FLEXIBLE SIGMOIDOSCOPY;  Surgeon: [name], MD;  Location: [facility] ENDOSCOPY;  Service: Gastroenterology;  Laterality: N/A;  . FLEXIBLE SIGMOIDOSCOPY N/A 08/04/2020   Procedure: FLEXIBLE SIGMOIDOSCOPY;  Surgeon: [name], MD;  Location[facility] ENDOSCOPY;  Service: Gastroenterology;  Laterality: N/A;  Per Dr. Vicente Males, unsedated  . ROTATOR CUFF REPAIR Bilateral    left-12/2009; right-03/2009 dr.califf  . TONSILLECTOMY    . TOTAL SHOULDER REPLACEMENT Left     Family History  Problem Relation  Age of Onset  . COPD Mother   . Cancer Brother        colon    No Known Allergies  Current Outpatient Medications on File Prior to Visit  Medication Sig Dispense Refill  . denosumab (PROLIA) 60 MG/ML SOSY injection Inject 60 mg into the skin every 6 (six) months.    Marland Kitchen HYDROmorphone (DILAUDID) 4 MG tablet Take 4 mg by mouth 3 (three) times daily.     . OXYGEN Inhale into the lungs. 2 L at night    . sulfaSALAzine (AZULFIDINE) 500 MG tablet Take 1 tablet (500 mg total) by mouth 4 (four) times daily. 120 tablet 5  . vancomycin (VANCOCIN) 125 MG capsule 4 times daily for 7 days; then decrease to 3 times daily for 10 days; then decrease to 2 times daily for 10 days 78 capsule 0  . VOLTAREN 1 % GEL   2  . Cholecalciferol 25 MCG (1000 UT) capsule Take 1,000 Units by mouth  daily. (Patient not taking: Reported on 08/10/2020)    . citalopram (CELEXA) 40 MG tablet Take 1 tablet (40 mg total) by mouth daily. For anxiety. (Patient not taking: Reported on 08/10/2020) 90 tablet 1  . ESBRIET 267 MG TABS Take 6 tablets by mouth daily.  (Patient not taking: Reported on 08/10/2020)     No current facility-administered medications on file prior to visit.    Pulse 79   Temp (!) 96 F (35.6 C) (Oral)   Ht 5' (1.524 m)   Wt 120 lb (54.4 kg)   SpO2 95%   BMI 23.44 kg/m    Objective:   Physical Exam Constitutional:      General: She is not in acute distress. Pulmonary:     Effort: Pulmonary effort is normal.     Comments: No cough during visit Neurological:     Mental Status: She is alert and oriented to person, place, and time.  Psychiatric:        Mood and Affect: Mood normal.            Assessment & Plan:

## 2020-08-14 DIAGNOSIS — R059 Cough, unspecified: Secondary | ICD-10-CM

## 2020-08-15 ENCOUNTER — Ambulatory Visit
Admission: RE | Admit: 2020-08-15 | Discharge: 2020-08-15 | Disposition: A | Discharge status: Home / Self Care | Payer: Medicare Other | Source: Ambulatory Visit | Attending: Primary Care | Admitting: Primary Care

## 2020-08-15 ENCOUNTER — Other Ambulatory Visit: Payer: Self-pay

## 2020-08-15 ENCOUNTER — Ambulatory Visit
Admission: RE | Admit: 2020-08-15 | Discharge: 2020-08-15 | Disposition: A | Discharge status: Home / Self Care | Payer: Medicare Other | Attending: Primary Care | Admitting: Primary Care

## 2020-08-15 DIAGNOSIS — R059 Cough, unspecified: Secondary | ICD-10-CM | POA: Insufficient documentation

## 2020-08-23 ENCOUNTER — Ambulatory Visit: Payer: Medicare Other | Admitting: Dermatology

## 2020-08-24 ENCOUNTER — Telehealth (INDEPENDENT_AMBULATORY_CARE_PROVIDER_SITE_OTHER): Payer: Medicare Other | Admitting: Gastroenterology

## 2020-08-24 DIAGNOSIS — K5190 Ulcerative colitis, unspecified, without complications: Secondary | ICD-10-CM

## 2020-08-24 DIAGNOSIS — A0472 Enterocolitis due to Clostridium difficile, not specified as recurrent: Secondary | ICD-10-CM

## 2020-08-24 MED ORDER — SULFASALAZINE 500 MG PO TABS: 500.0000 mg | ORAL_TABLET | Freq: Two times a day (BID) | ORAL | 1 refills | Status: DC

## 2020-08-24 MED ORDER — SULFASALAZINE 500 MG PO TABS: 500.0000 mg | ORAL_TABLET | Freq: Four times a day (QID) | ORAL | 5 refills | Status: DC

## 2020-08-24 MED ORDER — VANCOMYCIN HCL 125 MG PO CAPS: ORAL_CAPSULE | ORAL | 0 refills | Status: DC

## 2020-08-24 NOTE — Addendum Note (Signed)
Addended by: Dorethea Clan on: 08/24/2020 01:36 PM   Modules accepted: Orders

## 2020-08-24 NOTE — Progress Notes (Addendum)
[name] MD, MRCP(U.K) [address]  [place]  [place], [place] [address]  Main: [phone]  Fax: [phone]   Primary Care Physician: [name], NP  Primary Gastroenterologist:  Dr. [name]   Follow-up for diarrhea and recent sigmoidoscopy Telephone visit   HPI: [name] is a 83 y.o. female   Summary of history :  She was previously seen in 2020diarrhea after being referred by her primary care physician in January 2020. At that point of time she has been having diarrhea for over 2 months She was seen in 2018 by [facility] clinic GI in12/2009she had a colonoscopy as per the last GI note showed chronic colitis with focal cryptitis. Focal active colitis. Not sure why it was not felt that this patient had inflammatory bowel disease. At her initial visit she had beenon Meloxicam for about 2-3 Months, 2 tablets a day and no other NSAID's. She also did have blood in her stool at that point of time.  10/13/2018: EGD: normal . SigmoidoscopyDiscontinuous areas of nonbleeding ulcerated mucosa with no stigmata of recent bleeding were present in the sigmoid colon, in thedescending colon and in the distal transverse colon. Biopsies were taken with a cold forceps for histology.Rectum was normal. Pathology report showed chronic active colitis and normal rectal biopsies. HSV and CMV negative.  In February 2021 when she came to see me she hadstoppedmeloxicam and doing well.Was not having any diarrhea.  09/09/2019: Sigmoidoscopy: I went all the way to her mid transverse colon. No evidence of colitis was seen. There was a single solitary ulcer in the proximal descending colon. No bleeding was seen. Biopsies were taken with forceps. And it demonstrated chronic colitis with mild to moderate activity and small crypt abscess. 09/13/2019: Vitamin D normal, hemoglobin 13.7 with a platelet count of 224 and a white cell count of 9.8. CMP was  normal.  06/02/2020 CRP 0.7, CMP showed a calcium of 8.7 normal AST and ALT. Hemoglobin on 06/01/2020 showed a normal study with normal differential count  She has been commenced on vancomycin on 07/03/2020 for C. difficile diarrhea.  She called our office on 07/11/2020 informed that the diarrhea was recurring when she went off the vancomycin.  I subsequently suggested her to increase her dose back to 4 times a day for 3 weeks with a plan to gradually taper off.   Interval history11/15/2021-08/24/2020 08/01/2020: CRP less than 1, CMP normal, hemoglobin 12.4 g 08/04/2020: Flexible sigmoidoscopy: The mucosa of the entire examined colon appeared normal diverticulosis noted.  Biopsies taken.  Showed focal mild active colitis in the left colon and the rectum showed no active colitis.  She is doing well overall.  Has 3 bowel movements a day.  Lots of gas and bloating.  She uses some artificial sugar daily.  Taking her sulfasalazine 4 tablets a day.  On vancomycin 2 tablets a day.    Current Outpatient Medications  Medication Sig Dispense Refill  . benzonatate (TESSALON) 200 MG capsule Take 1 capsule (200 mg total) by mouth 3 (three) times daily as needed for cough. 15 capsule 0  . Cholecalciferol 25 MCG (1000 UT) capsule Take 1,000 Units by mouth daily. (Patient not taking: Reported on 08/10/2020)    . citalopram (CELEXA) 40 MG tablet Take 1 tablet (40 mg total) by mouth daily. For anxiety. (Patient not taking: Reported on 08/10/2020) 90 tablet 1  . denosumab (PROLIA) 60 MG/ML SOSY injection Inject 60 mg into the skin every 6 (six) months.    Marland Kitchen  ESBRIET 267 MG TABS Take 6 tablets by mouth daily.  (Patient not taking: Reported on 08/10/2020)    . HYDROmorphone (DILAUDID) 4 MG tablet Take 4 mg by mouth 3 (three) times daily.     . OXYGEN Inhale into the lungs. 2 L at night    . sulfaSALAzine (AZULFIDINE) 500 MG tablet Take 1 tablet (500 mg total) by mouth 4 (four) times daily. 120 tablet 5  .  vancomycin (VANCOCIN) 125 MG capsule 4 times daily for 7 days; then decrease to 3 times daily for 10 days; then decrease to 2 times daily for 10 days 78 capsule 0  . VOLTAREN 1 % GEL   2   No current facility-administered medications for this visit.    Allergies as of 08/24/2020  . (No Known Allergies)    ROS:  General: Negative for anorexia, weight loss, fever, chills, fatigue, weakness. ENT: Negative for hoarseness, difficulty swallowing , nasal congestion. CV: Negative for chest pain, angina, palpitations, dyspnea on exertion, peripheral edema.  Respiratory: Negative for dyspnea at rest, dyspnea on exertion, cough, sputum, wheezing.  GI: See history of present illness. GU:  Negative for dysuria, hematuria, urinary incontinence, urinary frequency, nocturnal urination.  Endo: Negative for unusual weight change.      Imaging Studies: DG Chest 2 View  Result Date: 08/15/2020 CLINICAL DATA:  Cough, congestion EXAM: CHEST - 2 VIEW COMPARISON:  05/27/2020 FINDINGS: The heart size and mediastinal contours are within normal limits. Unchanged mild, diffuse interstitial pulmonary opacity and elevation of the left hemidiaphragm, with fibrotic airspace opacity of the bilateral lung bases. The visualized skeletal structures are unremarkable. IMPRESSION: Unchanged mild, diffuse interstitial pulmonary opacity, which may reflect mild edema, with underlying fibrosis of the bilateral lung bases. No new or focal airspace opacity. Electronically Signed   By: Eddie Candle M.D.   On: 08/15/2020 14:58    Assessment and Plan:   [name] is a 84 y.o. y/o femalehere to followfor an urgent visit for diarrheasecondary to C. difficile colitis.Marland Kitchen She was previously started on sulfasalazine for colitis likely ulcerative colitis.She has been on vancomycin. She is on a gradual taper.  Doing well.  She still has some loose stools which could be a side effect of the  sulfasalazine.   Plan 1.Continue vancomycin 2 tablets a day and complete her 10 days then drop to 1 tablet a day for a week and then 1 tablet every other day for a week and stop 2.  Decrease sulfasalazine to 2 tablets a day.  If no better at next visit we will consider a low FODMAP diet, charcoal tablets and stopping all artificial sugars.    Dr [name]  MD,MRCP [facility]) Follow up in 4 weeks telephone visit    Addendum  Virtual Visit via Telephone Note  I connected with patient on 08/01/2020 at  by telephone and verified that I am speaking with the correct person using two identifiers.   I discussed the limitations, risks, security and privacy concerns of performing an evaluation and management service by telephone and the availability of in person appointments. I also discussed with the patient that there may be a patient responsible charge related to this service. The patient expressed understanding and agreed to proceed.  Location of Patient: Home Location of Provider: Home Persons involved: Patient and provider only

## 2020-09-04 ENCOUNTER — Telehealth: Payer: Self-pay

## 2020-09-04 ENCOUNTER — Telehealth (INDEPENDENT_AMBULATORY_CARE_PROVIDER_SITE_OTHER): Payer: Medicare Other | Admitting: Gastroenterology

## 2020-09-04 ENCOUNTER — Telehealth: Payer: Self-pay | Admitting: Gastroenterology

## 2020-09-04 DIAGNOSIS — R197 Diarrhea, unspecified: Secondary | ICD-10-CM | POA: Diagnosis not present

## 2020-09-04 MED ORDER — DICYCLOMINE HCL 10 MG PO CAPS: 10.0000 mg | ORAL_CAPSULE | Freq: Three times a day (TID) | ORAL | 0 refills | Status: DC | PRN

## 2020-09-04 NOTE — Telephone Encounter (Signed)
Medication approved by patient insurance

## 2020-09-04 NOTE — Progress Notes (Signed)
[name] , MD [address]  [place]  [place], [place] [address]  Main: [phone]  Fax: [phone]   Primary Care Physician: [name], NP  Virtual Visit via Telephone Note  I connected with patient on 09/04/20 at 11:00 AM EST by telephone and verified that I am speaking with the correct person using two identifiers.   I discussed the limitations, risks, security and privacy concerns of performing an evaluation and management service by telephone and the availability of in person appointments. I also discussed with the patient that there may be a patient responsible charge related to this service. The patient expressed understanding and agreed to proceed.  Location of Patient: Home Location of Provider: Home Persons involved: Patient and provider only   History of Present Illness:  Diarrhea  HPI: [name] is a 85 y.o. female   Summary of history :  She was previously seen in 2020diarrhea after being referred by her primary care physician in January 2020. At that point of time she has been having diarrhea for over 2 months She was seen in 2018 by [facility] clinic GI in12/2009she had a colonoscopy as per the last GI note showed chronic colitis with focal cryptitis. Focal active colitis. Not sure why it was not felt that this patient had inflammatory bowel disease. At her initial visit she had beenon Meloxicam for about 2-3 Months, 2 tablets a day and no other NSAID's. She also did have blood in her stool at that point of time.  10/13/2018: EGD: normal . SigmoidoscopyDiscontinuous areas of nonbleeding ulcerated mucosa with no stigmata of recent bleeding were present in the sigmoid colon, in thedescending colon and in the distal transverse colon. Biopsies were taken with a cold forceps for histology.Rectum was normal. Pathology report showed chronic active colitis and normal rectal biopsies. HSV and CMV negative.  In February 2021 when  she came to see me she hadstoppedmeloxicam and doing well.Was not having any diarrhea.  09/09/2019: Sigmoidoscopy: I went all the way to her mid transverse colon. No evidence of colitis was seen. There was a single solitary ulcer in the proximal descending colon. No bleeding was seen. Biopsies were taken with forceps. And it demonstrated chronic colitis with mild to moderate activity and small crypt abscess. 09/13/2019: Vitamin D normal, hemoglobin 13.7 with a platelet count of 224 and a white cell count of 9.8. CMP was normal.  06/02/2020 CRP 0.7, CMP showed a calcium of 8.7 normal AST and ALT. Hemoglobin on 06/01/2020 showed a normal study with normal differential count  She had been commenced on vancomycinon 07/03/2020 for C. difficile diarrhea. She called our office on 07/11/2020 informed that the diarrhea was recurring when she went off the vancomycin. I subsequently suggested her to increase her dose back to 4 times a day for 3 weeks with a plan to gradually taper off.  08/01/2020: CRP less than 1, CMP normal, hemoglobin 12.4 g 08/04/2020: Flexible sigmoidoscopy: The mucosa of the entire examined colon appeared normal diverticulosis noted.  Biopsies taken.  Showed focal mild active colitis in the left colon and the rectum showed no active colitis.  Interval history12/23/2021-09/05/2019  After her last visit she stopped taking all the sulfasalazine and diarrhea has improved, has 2 mushy bowel movements non bloody daily. Denies any abdominal pain . Poor apetite. She has a few more doses of vancomycin left . Non specific uneasy sensation she states in my "stomach" , not pain, gas or bloating or a heartburn  Current Outpatient Medications  Medication Sig Dispense Refill  . benzonatate (TESSALON) 200 MG capsule Take 1 capsule (200 mg total) by mouth 3 (three) times daily as needed for cough. 15 capsule 0  . Cholecalciferol 25 MCG (1000 UT) capsule Take 1,000 Units by mouth daily.  (Patient not taking: Reported on 08/10/2020)    . citalopram (CELEXA) 40 MG tablet Take 1 tablet (40 mg total) by mouth daily. For anxiety. (Patient not taking: Reported on 08/10/2020) 90 tablet 1  . denosumab (PROLIA) 60 MG/ML SOSY injection Inject 60 mg into the skin every 6 (six) months.    . ESBRIET 267 MG TABS Take 6 tablets by mouth daily.  (Patient not taking: Reported on 08/10/2020)    . HYDROmorphone (DILAUDID) 4 MG tablet Take 4 mg by mouth 3 (three) times daily.     . OXYGEN Inhale into the lungs. 2 L at night    . sulfaSALAzine (AZULFIDINE) 500 MG tablet Take 1 tablet (500 mg total) by mouth 2 (two) times daily. 180 tablet 1  . vancomycin (VANCOCIN) 125 MG capsule 1 tab daily for 7 days then decrease to 1 tab every other day for 7 days 10 capsule 0  . VOLTAREN 1 % GEL   2   No current facility-administered medications for this visit.    Allergies as of 09/04/2020  . (No Known Allergies)    Review of Systems:    All systems reviewed and negative except where noted in HPI.   Observations/Objective:  Labs: CMP     Component Value Date/Time   NA 139 08/01/2020 1419   NA 139 02/09/2014 0500   K 4.2 08/01/2020 1419   K 3.2 (L) 02/09/2014 0500   CL 101 08/01/2020 1419   CL 106 02/09/2014 0500   CO2 22 08/01/2020 1419   CO2 27 02/09/2014 0500   GLUCOSE 115 (H) 08/01/2020 1419   GLUCOSE 95 06/02/2020 0450   GLUCOSE 85 02/09/2014 0500   BUN 16 08/01/2020 1419   BUN 11 02/09/2014 0500   CREATININE 0.69 08/01/2020 1419   CREATININE 0.78 02/09/2014 0500   CALCIUM 9.4 08/01/2020 1419   CALCIUM 8.8 02/09/2014 0500   PROT 6.7 08/01/2020 1419   PROT 6.7 02/08/2014 1247   ALBUMIN 3.9 08/01/2020 1419   ALBUMIN 3.2 (L) 02/08/2014 1247   AST 34 08/01/2020 1419   AST 35 02/08/2014 1247   ALT 17 08/01/2020 1419   ALT 23 02/08/2014 1247   ALKPHOS 80 08/01/2020 1419   ALKPHOS 101 02/08/2014 1247   BILITOT 0.4 08/01/2020 1419   BILITOT 0.7 02/08/2014 1247   GFRNONAA 81  08/01/2020 1419   GFRNONAA >60 02/09/2014 0500   GFRAA 94 08/01/2020 1419   GFRAA >60 02/09/2014 0500   Lab Results  Component Value Date   WBC 7.0 08/01/2020   HGB 12.4 08/01/2020   HCT 36.3 08/01/2020   MCV 90 08/01/2020   PLT 287 08/01/2020    Imaging Studies: DG Chest 2 View  Result Date: 08/15/2020 CLINICAL DATA:  Cough, congestion EXAM: CHEST - 2 VIEW COMPARISON:  05/27/2020 FINDINGS: The heart size and mediastinal contours are within normal limits. Unchanged mild, diffuse interstitial pulmonary opacity and elevation of the left hemidiaphragm, with fibrotic airspace opacity of the bilateral lung bases. The visualized skeletal structures are unremarkable. IMPRESSION: Unchanged mild, diffuse interstitial pulmonary opacity, which may reflect mild edema, with underlying fibrosis of the bilateral lung bases. No new or focal airspace opacity. Electronically Signed   By: Cristie Hem  Laqueta Carina M.D.   On: 08/15/2020 14:58    Assessment and Plan:   SHANA ZAVALETA is a 85 y.o. y/o femalehere to followfor an urgent visit for diarrheasecondary to C. difficile colitis.Marland Kitchen She was previously started on sulfasalazine for colitis likely ulcerative colitis.She has been on vancomycin. She is on a gradual taper. Felt much better when she stopped sulfasalazine, likely side effects of the medication    Plan 1.Complete course of vancomycin  2.  Decrease sulfasalazine to 2 tablets a day and restart 3. PRN bentyl 10 mg for pain or discfort upto 3 times a day   Telephone visit in 10 days     I discussed the assessment and treatment plan with the patient. The patient was provided an opportunity to ask questions and all were answered. The patient agreed with the plan and demonstrated an understanding of the instructions.   The patient was advised to call back or seek an in-person evaluation if the symptoms worsen or if the condition fails to improve as anticipated.  I provided 15 minutes of  non-face-to-face time during this encounter.  Dr Terri Bellows MD,MRCP [facility]) Gastroenterology/Hepatology Pager: [phone]   Speech recognition software was used to dictate this note.

## 2020-09-04 NOTE — Telephone Encounter (Signed)
Patient was scheduled for ov on 1.3.22 @11AM  per Dr. Georgeann Oppenheim request. Pt notified per [place] on 12.30.21

## 2020-09-04 NOTE — Telephone Encounter (Signed)
Did prior authorization on Dicyclomine 75m through cover my meds. Waiting on response from insurance company

## 2020-09-07 DIAGNOSIS — M545 Low back pain, unspecified: Secondary | ICD-10-CM | POA: Diagnosis not present

## 2020-09-07 DIAGNOSIS — M79605 Pain in left leg: Secondary | ICD-10-CM | POA: Diagnosis not present

## 2020-09-07 DIAGNOSIS — R531 Weakness: Secondary | ICD-10-CM | POA: Diagnosis not present

## 2020-09-11 ENCOUNTER — Other Ambulatory Visit: Payer: Self-pay | Admitting: Primary Care

## 2020-09-11 DIAGNOSIS — F419 Anxiety disorder, unspecified: Secondary | ICD-10-CM

## 2020-09-11 DIAGNOSIS — R0600 Dyspnea, unspecified: Secondary | ICD-10-CM | POA: Diagnosis not present

## 2020-09-11 DIAGNOSIS — J84112 Idiopathic pulmonary fibrosis: Secondary | ICD-10-CM | POA: Diagnosis not present

## 2020-09-12 ENCOUNTER — Other Ambulatory Visit: Payer: Self-pay

## 2020-09-12 ENCOUNTER — Telehealth (INDEPENDENT_AMBULATORY_CARE_PROVIDER_SITE_OTHER): Payer: Medicare Other | Admitting: Gastroenterology

## 2020-09-12 DIAGNOSIS — R197 Diarrhea, unspecified: Secondary | ICD-10-CM

## 2020-09-12 DIAGNOSIS — R1013 Epigastric pain: Secondary | ICD-10-CM | POA: Diagnosis not present

## 2020-09-12 NOTE — Progress Notes (Signed)
[name] , MD [address]  [place]  [place], [place] [address]  Main: [phone]  Fax: [phone]   Primary Care Physician: [name], NP  Virtual Visit via Telephone Note  I connected with patient on 09/12/20 at  1:00 PM EST by telephone and verified that I am speaking with the correct person using two identifiers.   I discussed the limitations, risks, security and privacy concerns of performing an evaluation and management service by telephone and the availability of in person appointments. I also discussed with the patient that there may be a patient responsible charge related to this service. The patient expressed understanding and agreed to proceed.  Location of Patient: Home Location of Provider: Home Persons involved: Patient and provider only   History of Present Illness:  Diarrhea follow up   HPI: [name] is a 85 y.o. female   Summary of history :  She was previously seen in 2020diarrhea after being referred by her primary care physician in January 2020. At that point of time she has been having diarrhea for over 2 months She was seen in 2018 by [facility] clinic GI in12/2009she had a colonoscopy as per the last GI note showed chronic colitis with focal cryptitis. Focal active colitis. Not sure why it was not felt that this patient had inflammatory bowel disease. At her initial visit she had beenon Meloxicam for about 2-3 Months, 2 tablets a day and no other NSAID's. She also did have blood in her stool at that point of time.  10/13/2018: EGD: normal . SigmoidoscopyDiscontinuous areas of nonbleeding ulcerated mucosa with no stigmata of recent bleeding were present in the sigmoid colon, in thedescending colon and in the distal transverse colon. Biopsies were taken with a cold forceps for histology.Rectum was normal. Pathology report showed chronic active colitis and normal rectal biopsies. HSV and CMV negative.  In February  2021 when she came to see me she hadstoppedmeloxicam and doing well.Was not having any diarrhea.  09/09/2019: Sigmoidoscopy: I went all the way to her mid transverse colon. No evidence of colitis was seen. There was a single solitary ulcer in the proximal descending colon. No bleeding was seen. Biopsies were taken with forceps. And it demonstrated chronic colitis with mild to moderate activity and small crypt abscess. 09/13/2019: Vitamin D normal, hemoglobin 13.7 with a platelet count of 224 and a white cell count of 9.8. CMP was normal.  06/02/2020 CRP 0.7, CMP showed a calcium of 8.7 normal AST and ALT. Hemoglobin on 06/01/2020 showed a normal study with normal differential count  She had been commenced on vancomycinon 07/03/2020 for C. difficile diarrhea. She called our office on 07/11/2020 informed that the diarrhea was recurring when she went off the vancomycin. I subsequently suggested her to increase her dose back to 4 times a day for 3 weeks with a plan to gradually taper off.  08/01/2020: CRP less than 1, CMP normal, hemoglobin 12.4 g 08/04/2020: Flexible sigmoidoscopy: The mucosa of the entire examined colon appeared normal diverticulosis noted. Biopsies taken. Showed focal mild active colitis in the left colon and the rectum showed no active colitis.  Interval history1/11/2019-09/12/2020   The diarrhea has resolved.  She had a few more doses of vancomycin left.  She feels better from her bowel movements point of view but still has nonspecific abdominal discomfort which she is not able to pinpoint clearly.  She states that this is affecting her appetite and not helping her gain weight.  We had talked about obtaining a CT scan in the past and she would like to consider that at this point.  Current Outpatient Medications  Medication Sig Dispense Refill  . benzonatate (TESSALON) 200 MG capsule Take 1 capsule (200 mg total) by mouth 3 (three) times daily as needed for cough.  15 capsule 0  . Cholecalciferol 25 MCG (1000 UT) capsule Take 1,000 Units by mouth daily. (Patient not taking: Reported on 08/10/2020)    . citalopram (CELEXA) 40 MG tablet TAKE 1 TABLET (40 MG TOTAL) BY MOUTH DAILY. FOR ANXIETY. 90 tablet 1  . denosumab (PROLIA) 60 MG/ML SOSY injection Inject 60 mg into the skin every 6 (six) months.    . dicyclomine (BENTYL) 10 MG capsule Take 1 capsule (10 mg total) by mouth 3 (three) times daily as needed for spasms. 30 capsule 0  . ESBRIET 267 MG TABS Take 6 tablets by mouth daily.  (Patient not taking: Reported on 08/10/2020)    . HYDROmorphone (DILAUDID) 4 MG tablet Take 4 mg by mouth 3 (three) times daily.     . OXYGEN Inhale into the lungs. 2 L at night    . sulfaSALAzine (AZULFIDINE) 500 MG tablet Take 1 tablet (500 mg total) by mouth 2 (two) times daily. 180 tablet 1  . vancomycin (VANCOCIN) 125 MG capsule 1 tab daily for 7 days then decrease to 1 tab every other day for 7 days 10 capsule 0  . VOLTAREN 1 % GEL   2   No current facility-administered medications for this visit.    Allergies as of 09/12/2020  . (No Known Allergies)    Review of Systems:    All systems reviewed and negative except where noted in HPI.   Observations/Objective:  Labs: CMP     Component Value Date/Time   NA 139 08/01/2020 1419   NA 139 02/09/2014 0500   K 4.2 08/01/2020 1419   K 3.2 (L) 02/09/2014 0500   CL 101 08/01/2020 1419   CL 106 02/09/2014 0500   CO2 22 08/01/2020 1419   CO2 27 02/09/2014 0500   GLUCOSE 115 (H) 08/01/2020 1419   GLUCOSE 95 06/02/2020 0450   GLUCOSE 85 02/09/2014 0500   BUN 16 08/01/2020 1419   BUN 11 02/09/2014 0500   CREATININE 0.69 08/01/2020 1419   CREATININE 0.78 02/09/2014 0500   CALCIUM 9.4 08/01/2020 1419   CALCIUM 8.8 02/09/2014 0500   PROT 6.7 08/01/2020 1419   PROT 6.7 02/08/2014 1247   ALBUMIN 3.9 08/01/2020 1419   ALBUMIN 3.2 (L) 02/08/2014 1247   AST 34 08/01/2020 1419   AST 35 02/08/2014 1247   ALT 17  08/01/2020 1419   ALT 23 02/08/2014 1247   ALKPHOS 80 08/01/2020 1419   ALKPHOS 101 02/08/2014 1247   BILITOT 0.4 08/01/2020 1419   BILITOT 0.7 02/08/2014 1247   GFRNONAA 81 08/01/2020 1419   GFRNONAA >60 02/09/2014 0500   GFRAA 94 08/01/2020 1419   GFRAA >60 02/09/2014 0500   Lab Results  Component Value Date   WBC 7.0 08/01/2020   HGB 12.4 08/01/2020   HCT 36.3 08/01/2020   MCV 90 08/01/2020   PLT 287 08/01/2020    Imaging Studies: DG Chest 2 View  Result Date: 08/15/2020 CLINICAL DATA:  Cough, congestion EXAM: CHEST - 2 VIEW COMPARISON:  05/27/2020 FINDINGS: The heart size and mediastinal contours are within normal limits. Unchanged mild, diffuse interstitial pulmonary opacity and elevation of the left hemidiaphragm, with fibrotic airspace opacity of the  bilateral lung bases. The visualized skeletal structures are unremarkable. IMPRESSION: Unchanged mild, diffuse interstitial pulmonary opacity, which may reflect mild edema, with underlying fibrosis of the bilateral lung bases. No new or focal airspace opacity. Electronically Signed   By: Eddie Candle M.D.   On: 08/15/2020 14:58    Assessment and Plan:   [name] is a 85 y.o. y/o female here to followfor diarrhea . H/o  C. difficile colitis.Marland Kitchen She was previously started on sulfasalazine for colitis likely ulcerative colitis.She has been on vancomycin. She is on a gradual taper and just has a few more doses left.  Likely part of her diarrhea was from the side effects of sulfasalazine  Plan 1.Complete course of vancomycin  2.  Continue sulfasalazine 2 tablets a day 3.  CT scan of the abdomen with and without contrast to evaluate dyspeptic symptoms and inability to gain weight  4.  Telephone visit in 4 weeks   I discussed the assessment and treatment plan with the patient. The patient was provided an opportunity to ask questions and all were answered. The patient agreed with the plan and demonstrated an  understanding of the instructions.   The patient was advised to call back or seek an in-person evaluation if the symptoms worsen or if the condition fails to improve as anticipated.  I provided 15 minutes of non-face-to-face time during this encounter.  Dr Terri Bellows MD,MRCP [facility]) Gastroenterology/Hepatology Pager: [phone]   Speech recognition software was used to dictate this note.

## 2020-09-12 NOTE — Addendum Note (Signed)
Addended by: [name] on: 09/12/2020 01:26 PM   Modules accepted: Orders

## 2020-09-12 NOTE — Progress Notes (Signed)
Called pt to inform her lab orders have been placed and verbal instructions were given regarding her CT scan. Pt verbalized understanding.

## 2020-09-15 DIAGNOSIS — M7541 Impingement syndrome of right shoulder: Secondary | ICD-10-CM | POA: Diagnosis not present

## 2020-09-21 DIAGNOSIS — R1013 Epigastric pain: Secondary | ICD-10-CM | POA: Diagnosis not present

## 2020-09-21 DIAGNOSIS — R197 Diarrhea, unspecified: Secondary | ICD-10-CM | POA: Diagnosis not present

## 2020-09-26 ENCOUNTER — Emergency Department: Payer: Medicare Other

## 2020-09-26 ENCOUNTER — Ambulatory Visit: Admission: RE | Admit: 2020-09-26 | Payer: Medicare Other | Source: Ambulatory Visit

## 2020-09-26 ENCOUNTER — Other Ambulatory Visit: Payer: Self-pay

## 2020-09-26 ENCOUNTER — Inpatient Hospital Stay
Admission: EM | Admit: 2020-09-26 | Discharge: 2020-10-02 | DRG: 522 | Disposition: A | Discharge status: Skilled Nursing Facility | Payer: Medicare Other | Attending: Internal Medicine | Admitting: Internal Medicine

## 2020-09-26 DIAGNOSIS — I10 Essential (primary) hypertension: Secondary | ICD-10-CM | POA: Diagnosis not present

## 2020-09-26 DIAGNOSIS — G8929 Other chronic pain: Secondary | ICD-10-CM | POA: Diagnosis not present

## 2020-09-26 DIAGNOSIS — S72011D Unspecified intracapsular fracture of right femur, subsequent encounter for closed fracture with routine healing: Secondary | ICD-10-CM | POA: Diagnosis not present

## 2020-09-26 DIAGNOSIS — G319 Degenerative disease of nervous system, unspecified: Secondary | ICD-10-CM | POA: Diagnosis not present

## 2020-09-26 DIAGNOSIS — Z825 Family history of asthma and other chronic lower respiratory diseases: Secondary | ICD-10-CM

## 2020-09-26 DIAGNOSIS — E559 Vitamin D deficiency, unspecified: Secondary | ICD-10-CM | POA: Diagnosis not present

## 2020-09-26 DIAGNOSIS — I959 Hypotension, unspecified: Secondary | ICD-10-CM | POA: Diagnosis not present

## 2020-09-26 DIAGNOSIS — J8410 Pulmonary fibrosis, unspecified: Secondary | ICD-10-CM | POA: Diagnosis not present

## 2020-09-26 DIAGNOSIS — R0902 Hypoxemia: Secondary | ICD-10-CM | POA: Diagnosis not present

## 2020-09-26 DIAGNOSIS — E538 Deficiency of other specified B group vitamins: Secondary | ICD-10-CM | POA: Diagnosis not present

## 2020-09-26 DIAGNOSIS — Z23 Encounter for immunization: Secondary | ICD-10-CM | POA: Diagnosis not present

## 2020-09-26 DIAGNOSIS — S72001A Fracture of unspecified part of neck of right femur, initial encounter for closed fracture: Secondary | ICD-10-CM | POA: Diagnosis not present

## 2020-09-26 DIAGNOSIS — M8580 Other specified disorders of bone density and structure, unspecified site: Secondary | ICD-10-CM | POA: Diagnosis present

## 2020-09-26 DIAGNOSIS — Z96612 Presence of left artificial shoulder joint: Secondary | ICD-10-CM | POA: Diagnosis present

## 2020-09-26 DIAGNOSIS — J9611 Chronic respiratory failure with hypoxia: Secondary | ICD-10-CM | POA: Diagnosis present

## 2020-09-26 DIAGNOSIS — W000XXA Fall on same level due to ice and snow, initial encounter: Secondary | ICD-10-CM | POA: Diagnosis present

## 2020-09-26 DIAGNOSIS — S72011A Unspecified intracapsular fracture of right femur, initial encounter for closed fracture: Principal | ICD-10-CM | POA: Diagnosis present

## 2020-09-26 DIAGNOSIS — E7800 Pure hypercholesterolemia, unspecified: Secondary | ICD-10-CM | POA: Diagnosis present

## 2020-09-26 DIAGNOSIS — F32A Depression, unspecified: Secondary | ICD-10-CM | POA: Diagnosis present

## 2020-09-26 DIAGNOSIS — F419 Anxiety disorder, unspecified: Secondary | ICD-10-CM | POA: Diagnosis not present

## 2020-09-26 DIAGNOSIS — Z9981 Dependence on supplemental oxygen: Secondary | ICD-10-CM

## 2020-09-26 DIAGNOSIS — E669 Obesity, unspecified: Secondary | ICD-10-CM | POA: Diagnosis not present

## 2020-09-26 DIAGNOSIS — S72011S Unspecified intracapsular fracture of right femur, sequela: Secondary | ICD-10-CM | POA: Diagnosis present

## 2020-09-26 DIAGNOSIS — Z85828 Personal history of other malignant neoplasm of skin: Secondary | ICD-10-CM | POA: Diagnosis not present

## 2020-09-26 DIAGNOSIS — M6281 Muscle weakness (generalized): Secondary | ICD-10-CM | POA: Diagnosis not present

## 2020-09-26 DIAGNOSIS — Z20822 Contact with and (suspected) exposure to covid-19: Secondary | ICD-10-CM | POA: Diagnosis present

## 2020-09-26 DIAGNOSIS — Z419 Encounter for procedure for purposes other than remedying health state, unspecified: Secondary | ICD-10-CM

## 2020-09-26 DIAGNOSIS — S0990XA Unspecified injury of head, initial encounter: Secondary | ICD-10-CM | POA: Diagnosis not present

## 2020-09-26 DIAGNOSIS — Z8616 Personal history of COVID-19: Secondary | ICD-10-CM | POA: Diagnosis not present

## 2020-09-26 DIAGNOSIS — R0689 Other abnormalities of breathing: Secondary | ICD-10-CM | POA: Diagnosis not present

## 2020-09-26 DIAGNOSIS — R279 Unspecified lack of coordination: Secondary | ICD-10-CM | POA: Diagnosis not present

## 2020-09-26 DIAGNOSIS — Z96641 Presence of right artificial hip joint: Secondary | ICD-10-CM | POA: Diagnosis not present

## 2020-09-26 DIAGNOSIS — W19XXXD Unspecified fall, subsequent encounter: Secondary | ICD-10-CM | POA: Diagnosis not present

## 2020-09-26 DIAGNOSIS — G4730 Sleep apnea, unspecified: Secondary | ICD-10-CM | POA: Diagnosis not present

## 2020-09-26 DIAGNOSIS — I517 Cardiomegaly: Secondary | ICD-10-CM | POA: Diagnosis not present

## 2020-09-26 DIAGNOSIS — M810 Age-related osteoporosis without current pathological fracture: Secondary | ICD-10-CM | POA: Diagnosis not present

## 2020-09-26 DIAGNOSIS — Z79899 Other long term (current) drug therapy: Secondary | ICD-10-CM | POA: Diagnosis not present

## 2020-09-26 DIAGNOSIS — Z6830 Body mass index (BMI) 30.0-30.9, adult: Secondary | ICD-10-CM | POA: Diagnosis not present

## 2020-09-26 DIAGNOSIS — J84112 Idiopathic pulmonary fibrosis: Secondary | ICD-10-CM | POA: Diagnosis present

## 2020-09-26 DIAGNOSIS — Z471 Aftercare following joint replacement surgery: Secondary | ICD-10-CM | POA: Diagnosis not present

## 2020-09-26 DIAGNOSIS — A0472 Enterocolitis due to Clostridium difficile, not specified as recurrent: Secondary | ICD-10-CM | POA: Diagnosis not present

## 2020-09-26 DIAGNOSIS — M47819 Spondylosis without myelopathy or radiculopathy, site unspecified: Secondary | ICD-10-CM | POA: Diagnosis present

## 2020-09-26 DIAGNOSIS — Z800 Family history of malignant neoplasm of digestive organs: Secondary | ICD-10-CM

## 2020-09-26 DIAGNOSIS — Z79891 Long term (current) use of opiate analgesic: Secondary | ICD-10-CM | POA: Diagnosis not present

## 2020-09-26 DIAGNOSIS — R52 Pain, unspecified: Secondary | ICD-10-CM | POA: Diagnosis not present

## 2020-09-26 DIAGNOSIS — M25551 Pain in right hip: Secondary | ICD-10-CM | POA: Diagnosis not present

## 2020-09-26 DIAGNOSIS — M5432 Sciatica, left side: Secondary | ICD-10-CM | POA: Diagnosis present

## 2020-09-26 DIAGNOSIS — S72041A Displaced fracture of base of neck of right femur, initial encounter for closed fracture: Secondary | ICD-10-CM | POA: Diagnosis not present

## 2020-09-26 DIAGNOSIS — I6782 Cerebral ischemia: Secondary | ICD-10-CM | POA: Diagnosis not present

## 2020-09-26 DIAGNOSIS — R5381 Other malaise: Secondary | ICD-10-CM | POA: Diagnosis not present

## 2020-09-26 LAB — COMPREHENSIVE METABOLIC PANEL
ALT: 18 U/L (ref 0–44)
AST: 31 U/L (ref 15–41)
Albumin: 3.6 g/dL (ref 3.5–5.0)
Alkaline Phosphatase: 58 U/L (ref 38–126)
Anion gap: 12 (ref 5–15)
BUN: 16 mg/dL (ref 8–23)
CO2: 23 mmol/L (ref 22–32)
Calcium: 8.5 mg/dL — ABNORMAL LOW (ref 8.9–10.3)
Chloride: 101 mmol/L (ref 98–111)
Creatinine, Ser: 0.69 mg/dL (ref 0.44–1.00)
GFR, Estimated: >60 mL/min (ref >60)
Glucose, Bld: 96 mg/dL (ref 70–99)
Potassium: 3.6 mmol/L (ref 3.5–5.1)
Sodium: 136 mmol/L (ref 135–145)
Total Bilirubin: 0.8 mg/dL (ref 0.3–1.2)
Total Protein: 7 g/dL (ref 6.5–8.1)

## 2020-09-26 LAB — CBC WITH DIFFERENTIAL/PLATELET
Abs Immature Granulocytes: 0.09 10*3/uL — ABNORMAL HIGH (ref 0.00–0.07)
Basophils Absolute: 0 10*3/uL (ref 0.0–0.1)
Basophils Relative: 0 %
Eosinophils Absolute: 0.1 10*3/uL (ref 0.0–0.5)
Eosinophils Relative: 1 %
HCT: 41.8 % (ref 36.0–46.0)
Hemoglobin: 13.5 g/dL (ref 12.0–15.0)
Immature Granulocytes: 1 %
Lymphocytes Relative: 14 %
Lymphs Abs: 1.8 10*3/uL (ref 0.7–4.0)
MCH: 30.7 pg (ref 26.0–34.0)
MCHC: 32.3 g/dL (ref 30.0–36.0)
MCV: 95 fL (ref 80.0–100.0)
Monocytes Absolute: 1 10*3/uL (ref 0.1–1.0)
Monocytes Relative: 8 %
Neutro Abs: 9.3 10*3/uL — ABNORMAL HIGH (ref 1.7–7.7)
Neutrophils Relative %: 76 %
Platelets: 199 10*3/uL (ref 150–400)
RBC: 4.4 MIL/uL (ref 3.87–5.11)
RDW: 13.8 % (ref 11.5–15.5)
WBC: 12.4 10*3/uL — ABNORMAL HIGH (ref 4.0–10.5)
nRBC: 0 % (ref 0.0–0.2)

## 2020-09-26 LAB — SAMPLE TO BLOOD BANK

## 2020-09-26 LAB — SARS CORONAVIRUS 2 BY RT PCR (HOSPITAL ORDER, PERFORMED IN ~~LOC~~ HOSPITAL LAB): SARS Coronavirus 2: NEGATIVE

## 2020-09-26 MED ORDER — METHOCARBAMOL 500 MG PO TABS
500.0000 mg | ORAL_TABLET | Freq: Four times a day (QID) | ORAL | Status: DC | PRN
  Filled 2020-09-26: qty 1

## 2020-09-26 MED ORDER — HYDROMORPHONE HCL 1 MG/ML IJ SOLN
0.5000 mg | INTRAMUSCULAR | Status: DC | PRN
  Administered 2020-09-26 (×3): 0.5 mg via INTRAVENOUS
  Filled 2020-09-26 (×3): qty 1

## 2020-09-26 MED ORDER — HYDROCODONE-ACETAMINOPHEN 5-325 MG PO TABS
1.0000 | ORAL_TABLET | Freq: Four times a day (QID) | ORAL | Status: DC | PRN
Start: 2020-09-26 — End: 2020-10-02
  Administered 2020-09-27: 1 via ORAL
  Administered 2020-09-27 – 2020-09-28 (×2): 2 via ORAL
  Filled 2020-09-26: qty 2
  Filled 2020-09-26: qty 1
  Filled 2020-09-26: qty 2

## 2020-09-26 MED ORDER — HYDROMORPHONE HCL 1 MG/ML IJ SOLN
1.0000 mg | INTRAMUSCULAR | Status: DC | PRN
  Administered 2020-09-27 (×4): 1 mg via INTRAVENOUS
  Filled 2020-09-26 (×5): qty 1

## 2020-09-26 MED ORDER — SULFASALAZINE 500 MG PO TABS
500.0000 mg | ORAL_TABLET | Freq: Two times a day (BID) | ORAL | Status: DC
  Filled 2020-09-26 (×2): qty 1

## 2020-09-26 MED ORDER — CEFAZOLIN SODIUM-DEXTROSE 1-4 GM/50ML-% IV SOLN
1.0000 g | Freq: Once | INTRAVENOUS | Status: DC
  Filled 2020-09-26: qty 50

## 2020-09-26 MED ORDER — VANCOMYCIN HCL 125 MG PO CAPS: 125.0000 mg | ORAL_CAPSULE | ORAL | Status: DC

## 2020-09-26 MED ORDER — CITALOPRAM HYDROBROMIDE 20 MG PO TABS
40.0000 mg | ORAL_TABLET | Freq: Every day | ORAL | Status: DC
  Administered 2020-09-27 – 2020-10-02 (×6): 40 mg via ORAL
  Filled 2020-09-26 (×8): qty 2

## 2020-09-26 MED ORDER — DICYCLOMINE HCL 10 MG PO CAPS
10.0000 mg | ORAL_CAPSULE | Freq: Three times a day (TID) | ORAL | Status: DC | PRN
  Filled 2020-09-26: qty 1

## 2020-09-26 MED ORDER — METHOCARBAMOL 1000 MG/10ML IJ SOLN
500.0000 mg | Freq: Four times a day (QID) | INTRAVENOUS | Status: DC | PRN
  Filled 2020-09-26: qty 5

## 2020-09-26 MED ORDER — SULFASALAZINE 500 MG PO TBEC
500.0000 mg | DELAYED_RELEASE_TABLET | Freq: Two times a day (BID) | ORAL | Status: DC
  Administered 2020-09-26 – 2020-10-02 (×11): 500 mg via ORAL
  Filled 2020-09-26 (×15): qty 1

## 2020-09-26 MED ORDER — HYDROMORPHONE HCL 1 MG/ML IJ SOLN
INTRAMUSCULAR | Status: AC
  Administered 2020-09-26: 1 mg via INTRAVENOUS
  Filled 2020-09-26: qty 1

## 2020-09-26 MED ORDER — SENNA 8.6 MG PO TABS
1.0000 | ORAL_TABLET | Freq: Two times a day (BID) | ORAL | Status: DC
  Filled 2020-09-26 (×7): qty 1

## 2020-09-26 MED ORDER — HYDROMORPHONE HCL 2 MG PO TABS
4.0000 mg | ORAL_TABLET | Freq: Three times a day (TID) | ORAL | Status: DC
  Administered 2020-09-26 – 2020-10-02 (×17): 4 mg via ORAL
  Filled 2020-09-26 (×17): qty 2

## 2020-09-26 MED ORDER — HYDROMORPHONE HCL 2 MG PO TABS
ORAL_TABLET | ORAL | Status: AC
  Administered 2020-09-26: 4 mg via ORAL
  Filled 2020-09-26: qty 2

## 2020-09-26 NOTE — H&P (Signed)
History and Physical    [name]:[id number] DOB: 1936-01-21 DOA: 09/26/2020  PCP: Doreene Nest, NP   Patient coming from: Home  I have personally briefly reviewed patient's old medical records in [facility]  Chief Complaint: Right hip pain  HPI: [name] is a 85 y.o. female with medical history significant for pulmonary fibrosis on 2 L of oxygen at night, chronic low back pain, anxiety who presents to the ER via EMS for evaluation of right hip pain following a fall.  Patient had an appointment and was trying to get into her car when she tripped on ice and fell.  She fell backwards and denies loss of consciousness.  She denies hitting her head.  She was unable to get up due to severe pain in her right hip and is unable to bear weight on the right lower extremity.  She rates her pain an 8 x 10 in intensity at its worst and pain is aggravated by any form of movement. She denies feeling dizzy or lightheaded, she denies having any chest pain, no nausea, no vomiting, no worsening shortness of breath, no headache, no abdominal pain, no palpitations, no diaphoresis, no fever, no chills, no urinary frequency, no nocturia, no dysuria, no headache, no cough, no lower extremity swelling. Labs show sodium 136, potassium 3.6, chloride 101, bicarb 23, glucose 96, BUN 16, creatinine 0.69, calcium 8.5, alkaline phosphatase 58, albumin 3.6, AST 31, ALT 18, total protein 7.0, white count 12.4, hemoglobin 13.5, hematocrit 41.8, MCV 95.0, RDW 13.8, platelet count 199 SARS coronavirus 2 PCR test is negative CT scan of the head without contrast showed no acute intracranial pathology.  Signs of atrophy and mild chronic microvascular ischemic change. Chest x-ray reviewed by me shows no acute cardiopulmonary disease.  Concerning for interstitial lung disease. X-ray of the right femur shows subcapital femoral neck fracture Twelve-lead EKG shows normal sinus rhythm with left axis deviation  and LVH   ED Course: Patient is an 85 year old Caucasian female who presents to the ER via EMS for evaluation of right hip pain following a mechanical fall.  Patient has a subcapital right femoral fracture and will be admitted to the hospital for surgical repair.  Review of Systems: As per HPI otherwise all systems reviewed and negative.   Past Medical History:  Diagnosis Date  . Altered mental state   . Anxiety   . BMI 30.0-30.9,adult   . Cellulitis   . Chest pain, atypical   . Compression fracture of L4 lumbar vertebra   . Constipation   . Cystitis   . Cystocele   . Diverticulosis   . Fatigue   . Fibrosis, idiopathic pulmonary (HCC)   . Gastroenteritis   . History of back surgery   . History of herniated intervertebral disc   . HTN (hypertension)   . Hypercholesteremia   . Hypocalcemia   . Incontinence of urine   . OA (osteoarthritis)    shoulder and back  . Obesity   . Osteopenia   . Pneumonia   . Shortness of breath   . Sleep apnea   . Squamous cell carcinoma of skin 07/16/2016   Right upper lateral eyebrow. SCCis.  . Squamous cell carcinoma of skin 01/13/2018   Left temporal hairline. WD SCC with superficial infiltration.  Ginette Pitman   . Vitamin D deficiency     Past Surgical History:  Procedure Laterality Date  . BACK SURGERY    . CATARACT EXTRACTION W/PHACO Left 04/24/2016  Procedure: CATARACT EXTRACTION PHACO AND INTRAOCULAR LENS PLACEMENT (IOC);  Surgeon: Lockie Mola, MD;  Location: [facility];  Service: Ophthalmology;  Laterality: Left;  . CATARACT EXTRACTION W/PHACO Right 07/14/2017   Procedure: CATARACT EXTRACTION PHACO AND INTRAOCULAR LENS PLACEMENT (IOC)  RIGHT;  Surgeon: Lockie Mola, MD;  Location: [facility];  Service: Ophthalmology;  Laterality: Right;  . CHOLECYSTECTOMY    . ESOPHAGOGASTRODUODENOSCOPY (EGD) WITH PROPOFOL N/A 10/13/2018   Procedure: ESOPHAGOGASTRODUODENOSCOPY (EGD) WITH PROPOFOL;  Surgeon: Wyline Mood, MD;  Location: [facility] END[facility]COPY;  Service: Gastroenterology;  Laterality: N/A;  . FLEXIBLE SIGMOIDOSCOPY N/A 10/13/2018   Procedure: FLEXIBLE SIGMOIDOSCOPY;  Surgeon: Wyline Mood, MD;  Location: [facility] END[facility];  Service: Gastroenterology;  Laterality: N/A;  . FLEXIBLE SIGMOIDOSCOPY N/A 09/09/2019   Procedure: FLEXIBLE SIGMOIDOSCOPY;  Surgeon: Wyline Mood, MD;  Location: [facility] ENDOSCOPY;  Service: Gastroenterology;  Laterality: N/A;  . FLEXIBLE SIGMOIDOSCOPY N/A 08/04/2020   Procedure: FLEXIBLE SIGMOIDOSCOPY;  Surgeon: Wyline Mood, MD;  Location: [facility] ENDOSCOPY;  Service: Gastroenterology;  Laterality: N/A;  Per Dr. Tobi Bastos, unsedated  . ROTATOR CUFF REPAIR Bilateral    left-12/2009; right-03/2009 dr.califf  . TONSILLECTOMY    . TOTAL SHOULDER REPLACEMENT Left      reports that she has never smoked. She has never used smokeless tobacco. She reports that she does not drink alcohol and does not use drugs.  No Known Allergies  Family History  Problem Relation Age of Onset  . COPD Mother   . Cancer Brother        colon     Prior to Admission medications   Medication Sig Start Date End Date Taking? Authorizing Provider  benzonatate (TESSALON) 200 MG capsule Take 1 capsule (200 mg total) by mouth 3 (three) times daily as needed for cough. 08/10/20   Doreene Nest, NP  Cholecalciferol 25 MCG (1000 UT) capsule Take 1,000 Units by mouth daily. Patient not taking: Reported on 08/10/2020    [provider]  citalopram (CELEXA) 40 MG tablet TAKE 1 TABLET (40 MG TOTAL) BY MOUTH DAILY. FOR ANXIETY. 09/11/20   Doreene Nest, NP  denosumab (PROLIA) 60 MG/ML SOSY injection Inject 60 mg into the skin every 6 (six) months.    [provider]  dicyclomine (BENTYL) 10 MG capsule Take 1 capsule (10 mg total) by mouth 3 (three) times daily as needed for spasms. 09/04/20   Wyline Mood, MD  ESBRIET 267 MG TABS Take 6 tablets by mouth daily.  Patient not taking: Reported on 08/10/2020 02/25/17    [provider]  HYDROmorphone (DILAUDID) 4 MG tablet Take 4 mg by mouth 3 (three) times daily.  04/23/19   [provider]  OXYGEN Inhale into the lungs. 2 L at night    [provider]  sulfaSALAzine (AZULFIDINE) 500 MG tablet Take 1 tablet (500 mg total) by mouth 2 (two) times daily. 08/24/20 02/20/21  Wyline Mood, MD  vancomycin (VANCOCIN) 125 MG capsule 1 tab daily for 7 days then decrease to 1 tab every other day for 7 days 08/24/20   Wyline Mood, MD  VOLTAREN 1 % GEL  12/23/17   [provider]    Physical Exam: Vitals:   09/26/20 1021 09/26/20 1023 09/26/20 1232  BP:  (!) 167/101 126/86  Pulse:  87 82  Resp:  (!) 22 18  Temp:  98.1 F (36.7 C) 98 F (36.7 C)  TempSrc:   Oral  SpO2:  91% 99%  Weight: 56.7 kg    Height: 5' (  1.524 m)       Vitals:   09/26/20 1021 09/26/20 1023 09/26/20 1232  BP:  (!) 167/101 126/86  Pulse:  87 82  Resp:  (!) 22 18  Temp:  98.1 F (36.7 C) 98 F (36.7 C)  TempSrc:   Oral  SpO2:  91% 99%  Weight: 56.7 kg    Height: 5' (1.524 m)      Constitutional: NAD, alert and oriented x 3 Eyes: PERRL, lids and conjunctivae normal ENMT: Mucous membranes are moist.  Neck: normal, supple, no masses, no thyromegaly Respiratory: clear to auscultation bilaterally, no wheezing, no crackles. Normal respiratory effort. No accessory muscle use.  Cardiovascular: Regular rate and rhythm, no murmurs / rubs / gallops. No extremity edema. 2+ pedal pulses. No carotid bruits.  Abdomen: no tenderness, no masses palpated. No hepatosplenomegaly. Bowel sounds positive.  Musculoskeletal: no clubbing / cyanosis.  Decreased range of motion right hip.  Shortening and external rotation of the right lower extremity Skin: no rashes, lesions, ulcers.  Neurologic: No gross focal neurologic deficit. Psychiatric: Normal mood and affect.   Labs on Admission: I have personally reviewed following labs and imaging studies  CBC: Recent Labs   Lab 09/26/20 1029  WBC 12.4*  NEUTROABS 9.3*  HGB 13.5  HCT 41.8  MCV 95.0  PLT 199   Basic Metabolic Panel: Recent Labs  Lab 09/21/20 1320 09/26/20 1029  NA 139 136  K 4.1 3.6  CL 100 101  CO2 23 23  GLUCOSE 101* 96  BUN 19 16  CREATININE 0.83 0.69  CALCIUM 9.7 8.5*   GFR: Estimated Creatinine Clearance: 42.8 mL/min (by C-G formula based on SCr of 0.69 mg/dL). Liver Function Tests: Recent Labs  Lab 09/26/20 1029  AST 31  ALT 18  ALKPHOS 58  BILITOT 0.8  PROT 7.0  ALBUMIN 3.6   No results for input(s): LIPASE, AMYLASE in the last 168 hours. No results for input(s): AMMONIA in the last 168 hours. Coagulation Profile: No results for input(s): INR, PROTIME in the last 168 hours. Cardiac Enzymes: No results for input(s): CKTOTAL, CKMB, CKMBINDEX, TROPONINI in the last 168 hours. BNP (last 3 results) No results for input(s): PROBNP in the last 8760 hours. HbA1C: No results for input(s): HGBA1C in the last 72 hours. CBG: No results for input(s): GLUCAP in the last 168 hours. Lipid Profile: No results for input(s): CHOL, HDL, LDLCALC, TRIG, CHOLHDL, LDLDIRECT in the last 72 hours. Thyroid Function Tests: No results for input(s): TSH, T4TOTAL, FREET4, T3FREE, THYROIDAB in the last 72 hours. Anemia Panel: No results for input(s): VITAMINB12, FOLATE, FERRITIN, TIBC, IRON, RETICCTPCT in the last 72 hours. Urine analysis:    Component Value Date/Time   COLORURINE AMBER (A) 05/27/2020 1751   APPEARANCEUR CLOUDY (A) 05/27/2020 1751   APPEARANCEUR Clear 02/02/2015 1148   LABSPEC 1.005 05/27/2020 1751   LABSPEC 1.023 02/08/2014 1851   PHURINE 9.0 (H) 05/27/2020 1751   GLUCOSEU NEGATIVE 05/27/2020 1751   GLUCOSEU Negative 02/08/2014 1851   HGBUR LARGE (A) 05/27/2020 1751   BILIRUBINUR NEGATIVE 05/27/2020 1751   BILIRUBINUR neg 02/16/2020 1151   BILIRUBINUR Negative 02/02/2015 1148   BILIRUBINUR Negative 02/08/2014 1851   KETONESUR 5 (A) 05/27/2020 1751    PROTEINUR NEGATIVE 05/27/2020 1751   UROBILINOGEN 0.2 02/16/2020 1151   NITRITE NEGATIVE 05/27/2020 1751   LEUKOCYTESUR MODERATE (A) 05/27/2020 1751   LEUKOCYTESUR 3+ 02/08/2014 1851    Radiological Exams on Admission: DG Chest 1 View  Result Date: 09/26/2020 CLINICAL DATA:  Fall.  Leg pain. EXAM: CHEST  1 VIEW COMPARISON:  08/15/2020 FINDINGS: Midline trachea. Mild cardiomegaly, accentuated by low lung volumes and AP portable technique. Basilar predominant subpleural interstitial thickening. Similar. No lobar consolidation. Osteopenia. Left shoulder arthroplasty. Thoracolumbar vertebral augmentation. IMPRESSION: No acute cardiopulmonary disease. Peripheral and basilar predominant interstitial thickening, suspicious for interstitial lung disease. Please see 08/03/2019 chest CT report. Electronically Signed   By: Jeronimo Greaves M.D.   On: 09/26/2020 11:28   CT Head Wo Contrast  Result Date: 09/26/2020 CLINICAL DATA:  RIGHT hip pain, fall without reported loss of consciousness, reportedly hit LEFT side of head. EXAM: CT HEAD WITHOUT CONTRAST TECHNIQUE: Contiguous axial images were obtained from the base of the skull through the vertex without intravenous contrast. COMPARISON:  Prior exam from May 29, 2020 FINDINGS: Brain: No evidence of acute infarction, hemorrhage, hydrocephalus, extra-axial collection or mass lesion/mass effect. Signs of atrophy and mild chronic microvascular ischemic change as before. Vascular: No hyperdense vessel or unexpected calcification. Skull: Normal. Negative for fracture or focal lesion. Sinuses/Orbits: Visualized paranasal sinuses and orbits are unremarkable. Other: None. IMPRESSION: 1. No acute intracranial pathology. 2. Signs of atrophy and mild chronic microvascular ischemic change as before. Electronically Signed   By: Donzetta Kohut M.D.   On: 09/26/2020 11:45   DG Femur Min 2 Views Right  Result Date: 09/26/2020 CLINICAL DATA:  Recent fall with hip pain,  initial encounter EXAM: RIGHT FEMUR 2 VIEWS COMPARISON:  None. FINDINGS: Subcapital femoral neck fracture is noted with mild impaction at the fracture site. No other focal abnormality is seen. IMPRESSION: Subcapital femoral neck fracture. Electronically Signed   By: Alcide Clever M.D.   On: 09/26/2020 11:27    EKG: Independently reviewed.  Normal sinus rhythm with left axis deviation and LVH   Assessment/Plan Principal Problem:   Subcapital fracture of femur, right, closed, initial encounter (HCC) Active Problems:   Anxiety   HTN (hypertension)   Idiopathic pulmonary fibrosis (HCC)     Subcapital fracture of right femur Following a mechanical fall Immobilize right lower extremity Pain control Place patient on muscle relaxants Orthopedic surgery consult    Idiopathic pulmonary fibrosis with chronic respiratory failure Stable Patient is on 2 L of oxygen at bedtime, will continue during his hospitalization    Depression/anxiety Continue Celexa      DVT prophylaxis: SCD Code Status: Full code Family Communication: Greater than 50% of time was spent discussing plan of care with patient and her husband at the bedside.  They verbalized understanding and agree with the plan. Disposition Plan: Back to previous home environment Consults called: Orthopedic surgery   Fermon Ureta MD [facility]     09/26/2020, 1:48 PM

## 2020-09-26 NOTE — Consult Note (Signed)
Reason for Consult: Right hip fracture Referring Physician: Dr. Paul Dykes [name] is an 85 y.o. female.  HPI: Patient is a 85 year old who suffered a fall on ice fracturing her right hip.  She does have history of chronic left leg sciatica and is on chronic Dilaudid 4 mg 3 times a day.  She also suffers from pulmonary fibrosis but she reports that is really her left leg pain that slows her down more than the lungs.  She denies prodromal symptoms or chronic pre-existing right hip pain.  Past Medical History:  Diagnosis Date  . Altered mental state   . Anxiety   . BMI 30.0-30.9,adult   . Cellulitis   . Chest pain, atypical   . Compression fracture of L4 lumbar vertebra   . Constipation   . Cystitis   . Cystocele   . Diverticulosis   . Fatigue   . Fibrosis, idiopathic pulmonary ([place])   . Gastroenteritis   . History of back surgery   . History of herniated intervertebral disc   . HTN (hypertension)   . Hypercholesteremia   . Hypocalcemia   . Incontinence of urine   . OA (osteoarthritis)    shoulder and back  . Obesity   . Osteopenia   . Pneumonia   . Shortness of breath   . Sleep apnea   . Squamous cell carcinoma of skin 07/16/2016   Right upper lateral eyebrow. SCCis.  . Squamous cell carcinoma of skin 01/13/2018   Left temporal hairline. WD SCC with superficial infiltration.  Ritta Slot   . Vitamin D deficiency     Past Surgical History:  Procedure Laterality Date  . BACK SURGERY    . CATARACT EXTRACTION W/PHACO Left 04/24/2016   Procedure: CATARACT EXTRACTION PHACO AND INTRAOCULAR LENS PLACEMENT (IOC);  Surgeon: Leandrew Koyanagi, MD;  Location: [place];  Service: Ophthalmology;  Laterality: Left;  . CATARACT EXTRACTION W/PHACO Right 07/14/2017   Procedure: CATARACT EXTRACTION PHACO AND INTRAOCULAR LENS PLACEMENT ([place])  RIGHT;  Surgeon: Leandrew Koyanagi, MD;  Location: [facility];  Service: Ophthalmology;  Laterality: Right;  .  CHOLECYSTECTOMY    . ESOPHAGOGASTRODUODENOSCOPY (EGD) WITH PROPOFOL N/A 10/13/2018   Procedure: ESOPHAGOGASTRODUODENOSCOPY (EGD) WITH PROPOFOL;  Surgeon: [name], MD;  Location: [facility] END[facility]COPY;  Service: Gastroenterology;  Laterality: N/A;  . FLEXIBLE SIGMOIDOSCOPY N/A 10/13/2018   Procedure: FLEXIBLE SIGMOIDOSCOPY;  Surgeon: [name], MD;  Location: [facility] ENDOSCOPY;  Service: Gastroenterology;  Laterality: N/A;  . FLEXIBLE SIGMOIDOSCOPY N/A 09/09/2019   Procedure: FLEXIBLE SIGMOIDOSCOPY;  Surgeon: [name], MD;  Location: [facility] ENDOSCOPY;  Service: Gastroenterology;  Laterality: N/A;  . FLEXIBLE SIGMOIDOSCOPY N/A 08/04/2020   Procedure: FLEXIBLE SIGMOIDOSCOPY;  Surgeon: [name], MD;  Location: [facility] ENDOSCOPY;  Service: Gastroenterology;  Laterality: N/A;  Per Dr. Vicente Males, unsedated  . ROTATOR CUFF REPAIR Bilateral    left-12/2009; right-03/2009 dr.califf  . TONSILLECTOMY    . TOTAL SHOULDER REPLACEMENT Left     Family History  Problem Relation Age of Onset  . COPD Mother   . Cancer Brother        colon    Social History:  reports that she has never smoked. She has never used smokeless tobacco. She reports that she does not drink alcohol and does not use drugs.  Allergies: No Known Allergies  Medications: I have reviewed the patient's current medications.  Results for orders placed or performed during the hospital encounter of 09/26/20 (from the past 48 hour(s))  Sample to Blood Bank  Status: None   Collection Time: 09/26/20 10:28 AM  Result Value Ref Range   Blood Bank Specimen      SAMPLE HEMOLYZED NOTIFIED LAURIE LEMONS AT 1135 09/26/20 DAS   Sample Expiration      09/29/2020,2359 Performed at [facility], [address] [place], [place] [address]   CBC with Differential     Status: Abnormal   Collection Time: 09/26/20 10:29 AM  Result Value Ref Range   WBC 12.4 (H) 4.0 - 10.5 K/uL   RBC 4.40 3.87 - 5.11 MIL/uL   Hemoglobin 13.5 12.0 - 15.0 g/dL   HCT  41.8 36.0 - 46.0 %   MCV 95.0 80.0 - 100.0 fL   MCH 30.7 26.0 - 34.0 pg   MCHC 32.3 30.0 - 36.0 g/dL   RDW 13.8 11.5 - 15.5 %   Platelets 199 150 - 400 K/uL   nRBC 0.0 0.0 - 0.2 %   Neutrophils Relative % 76 %   Neutro Abs 9.3 (H) 1.7 - 7.7 K/uL   Lymphocytes Relative 14 %   Lymphs Abs 1.8 0.7 - 4.0 K/uL   Monocytes Relative 8 %   Monocytes Absolute 1.0 0.1 - 1.0 K/uL   Eosinophils Relative 1 %   Eosinophils Absolute 0.1 0.0 - 0.5 K/uL   Basophils Relative 0 %   Basophils Absolute 0.0 0.0 - 0.1 K/uL   Immature Granulocytes 1 %   Abs Immature Granulocytes 0.09 (H) 0.00 - 0.07 K/uL    Comment: Performed at [facility], [address] [place], [place] [address]  Comprehensive metabolic panel     Status: Abnormal   Collection Time: 09/26/20 10:29 AM  Result Value Ref Range   Sodium 136 135 - 145 mmol/L   Potassium 3.6 3.5 - 5.1 mmol/L    Comment: HEMOLYSIS AT THIS LEVEL MAY AFFECT RESULT   Chloride 101 98 - 111 mmol/L   CO2 23 22 - 32 mmol/L   Glucose, Bld 96 70 - 99 mg/dL    Comment: Glucose reference range applies only to samples taken after fasting for at least 8 hours.   BUN 16 8 - 23 mg/dL   Creatinine, Ser 0.69 0.44 - 1.00 mg/dL   Calcium 8.5 (L) 8.9 - 10.3 mg/dL   Total Protein 7.0 6.5 - 8.1 g/dL   Albumin 3.6 3.5 - 5.0 g/dL   AST 31 15 - 41 U/L   ALT 18 0 - 44 U/L   Alkaline Phosphatase 58 38 - 126 U/L   Total Bilirubin 0.8 0.3 - 1.2 mg/dL   GFR, Estimated >60 >60 mL/min    Comment: (NOTE) Calculated using the CKD-EPI Creatinine Equation (2021)    Anion gap 12 5 - 15    Comment: Performed at [facility], [address], [place], [place] [address]    DG Chest 1 View  Result Date: 09/26/2020 CLINICAL DATA:  Fall.  Leg pain. EXAM: CHEST  1 VIEW COMPARISON:  08/15/2020 FINDINGS: Midline trachea. Mild cardiomegaly, accentuated by low lung volumes and AP portable technique. Basilar predominant subpleural interstitial thickening. Similar. No lobar  consolidation. Osteopenia. Left shoulder arthroplasty. Thoracolumbar vertebral augmentation. IMPRESSION: No acute cardiopulmonary disease. Peripheral and basilar predominant interstitial thickening, suspicious for interstitial lung disease. Please see 08/03/2019 chest CT report. Electronically Signed   By: Abigail Miyamoto M.D.   On: 09/26/2020 11:28   CT Head Wo Contrast  Result Date: 09/26/2020 CLINICAL DATA:  RIGHT hip pain, fall without reported loss of consciousness, reportedly  hit LEFT side of head. EXAM: CT HEAD WITHOUT CONTRAST TECHNIQUE: Contiguous axial images were obtained from the base of the skull through the vertex without intravenous contrast. COMPARISON:  Prior exam from May 29, 2020 FINDINGS: Brain: No evidence of acute infarction, hemorrhage, hydrocephalus, extra-axial collection or mass lesion/mass effect. Signs of atrophy and mild chronic microvascular ischemic change as before. Vascular: No hyperdense vessel or unexpected calcification. Skull: Normal. Negative for fracture or focal lesion. Sinuses/Orbits: Visualized paranasal sinuses and orbits are unremarkable. Other: None. IMPRESSION: 1. No acute intracranial pathology. 2. Signs of atrophy and mild chronic microvascular ischemic change as before. Electronically Signed   By: Zetta Bills M.D.   On: 09/26/2020 11:45   DG Femur Min 2 Views Right  Result Date: 09/26/2020 CLINICAL DATA:  Recent fall with hip pain, initial encounter EXAM: RIGHT FEMUR 2 VIEWS COMPARISON:  None. FINDINGS: Subcapital femoral neck fracture is noted with mild impaction at the fracture site. No other focal abnormality is seen. IMPRESSION: Subcapital femoral neck fracture. Electronically Signed   By: Inez Catalina M.D.   On: 09/26/2020 11:27    Review of Systems Blood pressure 126/86, pulse 82, temperature 98 F (36.7 C), temperature source Oral, resp. rate 18, height 5' (1.524 m), weight 56.7 kg, SpO2 99 %. Physical Exam Right leg is slightly shortened  and externally rotated with intact pulses and is able to flex down the toes.  There is a large bruise over the anterior knee Assessment/Plan: Displaced right femoral neck fracture without significant osteoarthritis Plan is for hemiarthroplasty tomorrow.  We will continue her p.o. Dilaudid perioperatively in addition to additional pain medicine for the acute pain.  Hessie Knows 09/26/2020, 12:57 PM

## 2020-09-26 NOTE — ED Notes (Signed)
Patient denies pain and is resting comfortably.  

## 2020-09-26 NOTE — ED Triage Notes (Signed)
Pt here via [facility] from home.  Pt was getting into her car today when she tripped and fell, denies LOC. Pt complains of R hip pain, hip is rotated outside. Pt then tried to get up but fell onto her knees again and hit the L side of her head, bruising present. Denies using blood thinners.   Pt received 64mg fentanyl with EMS.  BP 172/86, HR 118, EKG shows ventriclar bigeminy no known hx. Pt on 2L, uses 2L [place] at night. cbg 121.

## 2020-09-26 NOTE — ED Provider Notes (Signed)
[facility] Emergency Department Provider Note    Event Date/Time   First MD Initiated Contact with Patient 09/26/20 1011     (approximate)  I have reviewed the triage vital signs and the nursing notes.   HISTORY  Chief Complaint Fall    HPI [name] is a 85 y.o. female below listed past medical history presents to the ER for evaluation of right hip and thigh pain that occurred as she was walking to get into her car and slipped on some ice.  Not feel a popping sensation.  Landed on her right side.  She then tried to get up fell back on her knees did strike the left side of her head.  No LOC.  Denies any neck pain.  Is having mild to moderate right hip and leg pain.  Not on any blood thinners.    Past Medical History:  Diagnosis Date  . Altered mental state   . Anxiety   . BMI 30.0-30.9,adult   . Cellulitis   . Chest pain, atypical   . Compression fracture of L4 lumbar vertebra   . Constipation   . Cystitis   . Cystocele   . Diverticulosis   . Fatigue   . Fibrosis, idiopathic pulmonary ([place])   . Gastroenteritis   . History of back surgery   . History of herniated intervertebral disc   . HTN (hypertension)   . Hypercholesteremia   . Hypocalcemia   . Incontinence of urine   . OA (osteoarthritis)    shoulder and back  . Obesity   . Osteopenia   . Pneumonia   . Shortness of breath   . Sleep apnea   . Squamous cell carcinoma of skin 07/16/2016   Right upper lateral eyebrow. SCCis.  . Squamous cell carcinoma of skin 01/13/2018   Left temporal hairline. WD SCC with superficial infiltration.  Ritta Slot   . Vitamin D deficiency    Family History  Problem Relation Age of Onset  . COPD Mother   . Cancer Brother        colon   Past Surgical History:  Procedure Laterality Date  . BACK SURGERY    . CATARACT EXTRACTION W/PHACO Left 04/24/2016   Procedure: CATARACT EXTRACTION PHACO AND INTRAOCULAR LENS PLACEMENT (IOC);  Surgeon:  Leandrew Koyanagi, MD;  Location: [place];  Service: Ophthalmology;  Laterality: Left;  . CATARACT EXTRACTION W/PHACO Right 07/14/2017   Procedure: CATARACT EXTRACTION PHACO AND INTRAOCULAR LENS PLACEMENT ([place])  RIGHT;  Surgeon: Leandrew Koyanagi, MD;  Location: [place];  Service: Ophthalmology;  Laterality: Right;  . CHOLECYSTECTOMY    . ESOPHAGOGASTRODUODENOSCOPY (EGD) WITH PROPOFOL N/A 10/13/2018   Procedure: ESOPHAGOGASTRODUODENOSCOPY (EGD) WITH PROPOFOL;  Surgeon: [name], MD;  Location: [facility] ENDOSCOPY;  Service: Gastroenterology;  Laterality: N/A;  . FLEXIBLE SIGMOIDOSCOPY N/A 10/13/2018   Procedure: FLEXIBLE SIGMOIDOSCOPY;  Surgeon: [name], MD;  Location: [facility] ENDOSCOPY;  Service: Gastroenterology;  Laterality: N/A;  . FLEXIBLE SIGMOIDOSCOPY N/A 09/09/2019   Procedure: FLEXIBLE SIGMOIDOSCOPY;  Surgeon: [name], MD;  Location: [facility] ENDOSCOPY;  Service: Gastroenterology;  Laterality: N/A;  . FLEXIBLE SIGMOIDOSCOPY N/A 08/04/2020   Procedure: FLEXIBLE SIGMOIDOSCOPY;  Surgeon: [name], MD;  Location: [facility] ENDOSCOPY;  Service: Gastroenterology;  Laterality: N/A;  Per Dr. Vicente Males, unsedated  . ROTATOR CUFF REPAIR Bilateral    left-12/2009; right-03/2009 dr.califf  . TONSILLECTOMY    . TOTAL SHOULDER REPLACEMENT Left    Patient Active Problem List   Diagnosis Date Noted  .  Viral URI with cough 08/10/2020  . COVID-19 virus infection 05/28/2020  . Vitamin B 12 deficiency 03/15/2020  . Sensation of fullness in both ears 08/23/2019  . Osteopenia 05/27/2019  . Female cystocele 05/27/2019  . Urinary incontinence 05/27/2019  . Rectal bleeding 10/06/2018  . Fatigue 09/29/2018  . Vitamin D deficiency 03/23/2018  . 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 Tinea corporis 04/26/2016  . Family history of colon cancer requiring screening colonoscopy 04/26/2016  . Slow transit constipation 04/26/2016  . Candidiasis 01/17/2016  . Hypercholesteremia 04/24/2015  . Mild  cognitive impairment 04/24/2015  . Compression fracture of L4 lumbar vertebra 04/24/2015  . Vaginal pessary in situ 04/19/2015  . Cystocele with uterine descensus 03/30/2015  . Anxiety 02/02/2015  . Arthritis 02/02/2015  . HTN (hypertension) 02/02/2015  . Sleep apnea 02/02/2015  . 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 Authorizing Provider  benzonatate (TESSALON) 200 MG capsule Take 1 capsule (200 mg total) by mouth 3 (three) times daily as needed for cough. 08/10/20   [name], NP  Cholecalciferol 25 MCG (1000 UT) capsule Take 1,000 Units by mouth daily. Patient not taking: Reported on 08/10/2020    [provider]  citalopram (CELEXA) 40 MG tablet TAKE 1 TABLET (40 MG TOTAL) BY MOUTH DAILY. FOR ANXIETY. 09/11/20   [name], NP  denosumab (PROLIA) 60 MG/ML SOSY injection Inject 60 mg into the skin every 6 (six) months.    [provider]  dicyclomine (BENTYL) 10 MG capsule Take 1 capsule (10 mg total) by mouth 3 (three) times daily as needed for spasms. 09/04/20   [name], MD  ESBRIET 267 MG TABS Take 6 tablets by mouth daily.  Patient not taking: Reported on 08/10/2020 02/25/17   [provider]  HYDROmorphone (DILAUDID) 4 MG tablet Take 4 mg by mouth 3 (three) times daily.  04/23/19   [provider]  OXYGEN Inhale into the lungs. 2 L at night    [provider]  sulfaSALAzine (AZULFIDINE) 500 MG tablet Take 1 tablet (500 mg total) by mouth 2 (two) times daily. 08/24/20 02/20/21  [name], MD  vancomycin (VANCOCIN) 125 MG capsule 1 tab daily for 7 days then decrease to 1 tab every other day for 7 days 08/24/20   [name], MD  VOLTAREN 1 % GEL  12/23/17   [provider]    Allergies Patient has no known allergies.    Social History Social History   Tobacco Use  . Smoking status: Never Smoker  . Smokeless  tobacco: Never Used  Vaping Use  . Vaping Use: Never used  Substance Use Topics  . Alcohol use: No  . Drug use: No    Review of Systems Patient denies headaches, rhinorrhea, blurry vision, numbness, shortness of breath, chest pain, edema, cough, abdominal pain, nausea, vomiting, diarrhea, dysuria, fevers, rashes or hallucinations unless otherwise stated above in HPI. ____________________________________________   PHYSICAL EXAM:  VITAL SIGNS: Vitals:   09/26/20 1023 09/26/20 1232  BP: (!) 167/101 126/86  Pulse: 87 82  Resp: (!) 22 18  Temp: 98.1 F (36.7 C) 98 F (36.7 C)  SpO2: 91% 99%    Constitutional: Alert and oriented.  Eyes: Conjunctivae are normal.  Head: Atraumatic. Nose: No congestion/rhinnorhea. Mouth/Throat: Mucous membranes are moist.   Neck: No stridor. Painless ROM.  Cardiovascular: Normal rate, regular rhythm. Grossly normal heart sounds.  Good peripheral circulation.  Respiratory: Normal respiratory effort.  No retractions. Lungs CTAB. Gastrointestinal: Soft and nontender. No distention. No abdominal bruits. No CVA tenderness. Genitourinary:  Musculoskeletal:thigh compartment soft.  N/v intact distally.  rle slightly shortened and externally rotated.  Pain with log roll.  No joint effusions. Neurologic:  Normal speech and language. No gross focal neurologic deficits are appreciated. No facial droop Skin:  Skin is warm, dry and intact. No rash noted. Psychiatric: Mood and affect are normal. Speech and behavior are normal.  ____________________________________________   LABS (all labs ordered are listed, but only abnormal results are displayed)  Results for orders placed or performed during the hospital encounter of 09/26/20 (from the past 24 hour(s))  Sample to Blood Bank     Status: None   Collection Time: 09/26/20 10:28 AM  Result Value Ref Range   Blood Bank Specimen      SAMPLE HEMOLYZED NOTIFIED LAURIE LEMONS AT 1135 09/26/20 DAS   Sample  Expiration      09/29/2020,2359 Performed at [facility], [address] [place], [place] [address]   CBC with Differential     Status: Abnormal   Collection Time: 09/26/20 10:29 AM  Result Value Ref Range   WBC 12.4 (H) 4.0 - 10.5 K/uL   RBC 4.40 3.87 - 5.11 MIL/uL   Hemoglobin 13.5 12.0 - 15.0 g/dL   HCT 41.8 36.0 - 46.0 %   MCV 95.0 80.0 - 100.0 fL   MCH 30.7 26.0 - 34.0 pg   MCHC 32.3 30.0 - 36.0 g/dL   RDW 13.8 11.5 - 15.5 %   Platelets 199 150 - 400 K/uL   nRBC 0.0 0.0 - 0.2 %   Neutrophils Relative % 76 %   Neutro Abs 9.3 (H) 1.7 - 7.7 K/uL   Lymphocytes Relative 14 %   Lymphs Abs 1.8 0.7 - 4.0 K/uL   Monocytes Relative 8 %   Monocytes Absolute 1.0 0.1 - 1.0 K/uL   Eosinophils Relative 1 %   Eosinophils Absolute 0.1 0.0 - 0.5 K/uL   Basophils Relative 0 %   Basophils Absolute 0.0 0.0 - 0.1 K/uL   Immature Granulocytes 1 %   Abs Immature Granulocytes 0.09 (H) 0.00 - 0.07 K/uL  Comprehensive metabolic panel     Status: Abnormal   Collection Time: 09/26/20 10:29 AM  Result Value Ref Range   Sodium 136 135 - 145 mmol/L   Potassium 3.6 3.5 - 5.1 mmol/L   Chloride 101 98 - 111 mmol/L   CO2 23 22 - 32 mmol/L   Glucose, Bld 96 70 - 99 mg/dL   BUN 16 8 - 23 mg/dL   Creatinine, Ser 0.69 0.44 - 1.00 mg/dL   Calcium 8.5 (L) 8.9 - 10.3 mg/dL   Total Protein 7.0 6.5 - 8.1 g/dL   Albumin 3.6 3.5 - 5.0 g/dL   AST 31 15 - 41 U/L   ALT 18 0 - 44 U/L   Alkaline Phosphatase 58 38 - 126 U/L   Total Bilirubin 0.8 0.3 - 1.2 mg/dL   GFR, Estimated >60 >60 mL/min   Anion gap 12 5 - 15   ____________________________________________  EKG My review and personal interpretation at Time: 10:49   Indication: fall  Rate: 70  Rhythm: sinus Axis: left Other: normal intervals, no stemi ____________________________________________  RADIOLOGY  I personally reviewed all radiographic images ordered to evaluate for the above acute complaints and reviewed radiology reports and  findings.  These findings were personally discussed with the patient.  Please see medical record for radiology report.  ____________________________________________   PROCEDURES  Procedure(s) performed:  Procedures    Critical Care performed: no ____________________________________________   INITIAL IMPRESSION / ASSESSMENT AND PLAN / ED COURSE  Pertinent labs & imaging results that were available during my care of the patient were reviewed by me and considered in my medical decision making (see chart for details).   DDX: Fracture, contusion, dislocation, musculoskeletal strain  [name] is a 85 y.o. who presents to the ED with presentation as described above with evidence of right hip fracture will require operative management. I consulted with Dr. Rudene Christians of orthopedics. Given patient's age and comorbidities will discuss with hospitalist for admission.     The patient was evaluated in Emergency Department today for the symptoms described in the history of present illness. He/she was evaluated in the context of the global COVID-19 pandemic, which necessitated consideration that the patient might be at risk for infection with the SARS-CoV-2 virus that causes COVID-19. Institutional protocols and algorithms that pertain to the evaluation of patients at risk for COVID-19 are in a state of rapid change based on information released by regulatory bodies including the CDC and federal and state organizations. These policies and algorithms were followed during the patient's care in the ED.  As part of my medical decision making, I reviewed the following data within the [place] notes reviewed and incorporated, Labs reviewed, notes from prior ED visits and [place] Controlled Substance Database   ____________________________________________   FINAL CLINICAL IMPRESSION(S) / ED DIAGNOSES  Final diagnoses:  Closed subcapital fracture of femur, right, initial  encounter ([place])      NEW MEDICATIONS STARTED DURING THIS VISIT:  New Prescriptions   No medications on file     Note:  This document was prepared using Dragon voice recognition software and may include unintentional dictation errors.    Merlyn Lot, MD 09/26/20 1250

## 2020-09-26 NOTE — ED Notes (Signed)
Report given to Angela RN

## 2020-09-27 ENCOUNTER — Encounter
Admission: EM | Disposition: A | Discharge status: Skilled Nursing Facility | Payer: Self-pay | Source: Home / Self Care | Attending: Internal Medicine

## 2020-09-27 ENCOUNTER — Inpatient Hospital Stay: Payer: Medicare Other

## 2020-09-27 ENCOUNTER — Encounter: Payer: Self-pay | Admitting: Internal Medicine

## 2020-09-27 ENCOUNTER — Inpatient Hospital Stay: Payer: Medicare Other | Admitting: Anesthesiology

## 2020-09-27 HISTORY — PX: HIP ARTHROPLASTY: SHX981

## 2020-09-27 LAB — CBC
HCT: 40.4 % (ref 36.0–46.0)
Hemoglobin: 12.9 g/dL (ref 12.0–15.0)
MCH: 30.5 pg (ref 26.0–34.0)
MCHC: 31.9 g/dL (ref 30.0–36.0)
MCV: 95.5 fL (ref 80.0–100.0)
Platelets: 152 10*3/uL (ref 150–400)
RBC: 4.23 MIL/uL (ref 3.87–5.11)
RDW: 14.1 % (ref 11.5–15.5)
WBC: 13.6 10*3/uL — ABNORMAL HIGH (ref 4.0–10.5)
nRBC: 0 % (ref 0.0–0.2)

## 2020-09-27 LAB — CREATINE: Creatine, Serum: 0.7 mg/dL (ref 0.1–1.0)

## 2020-09-27 LAB — C DIFFICILE QUICK SCREEN W PCR REFLEX
C Diff antigen: POSITIVE — AB
C Diff toxin: NEGATIVE

## 2020-09-27 LAB — BASIC METABOLIC PANEL
BUN/Creatinine Ratio: 23 (ref 12–28)
BUN: 19 mg/dL (ref 8–27)
CO2: 23 mmol/L (ref 20–29)
Calcium: 9.7 mg/dL (ref 8.7–10.3)
Chloride: 100 mmol/L (ref 96–106)
Creatinine, Ser: 0.83 mg/dL (ref 0.57–1.00)
GFR calc Af Amer: 76 mL/min/{1.73_m2} (ref >59)
GFR calc non Af Amer: 66 mL/min/{1.73_m2} (ref >59)
Glucose: 101 mg/dL — ABNORMAL HIGH (ref 65–99)
Potassium: 4.1 mmol/L (ref 3.5–5.2)
Sodium: 139 mmol/L (ref 134–144)

## 2020-09-27 LAB — CREATININE, SERUM
Creatinine, Ser: 0.86 mg/dL (ref 0.44–1.00)
GFR, Estimated: >60 mL/min (ref >60)

## 2020-09-27 LAB — CLOSTRIDIUM DIFFICILE BY PCR, REFLEXED: Toxigenic C. Difficile by PCR: POSITIVE — AB

## 2020-09-27 SURGERY — HEMIARTHROPLASTY, HIP, DIRECT ANTERIOR APPROACH, FOR FRACTURE
Anesthesia: Spinal | Site: Hip | Laterality: Right

## 2020-09-27 MED ORDER — BUPIVACAINE LIPOSOME 1.3 % IJ SUSP
INTRAMUSCULAR | Status: AC
  Filled 2020-09-27: qty 20

## 2020-09-27 MED ORDER — LACTATED RINGERS IV SOLN: INTRAVENOUS | Status: DC | PRN

## 2020-09-27 MED ORDER — CEFAZOLIN SODIUM-DEXTROSE 1-4 GM/50ML-% IV SOLN
INTRAVENOUS | Status: AC
  Filled 2020-09-27: qty 50

## 2020-09-27 MED ORDER — MENTHOL 3 MG MT LOZG
1.0000 | LOZENGE | OROMUCOSAL | Status: DC | PRN
  Filled 2020-09-27: qty 9

## 2020-09-27 MED ORDER — FENTANYL CITRATE (PF) 100 MCG/2ML IJ SOLN
25.0000 ug | INTRAMUSCULAR | Status: DC | PRN
  Administered 2020-09-27: 25 ug via INTRAVENOUS

## 2020-09-27 MED ORDER — MAGNESIUM CITRATE PO SOLN
1.0000 | Freq: Once | ORAL | Status: DC | PRN
  Filled 2020-09-27: qty 296

## 2020-09-27 MED ORDER — PIRFENIDONE 267 MG PO TABS: 6.0000 | ORAL_TABLET | Freq: Every day | ORAL | Status: DC

## 2020-09-27 MED ORDER — PANTOPRAZOLE SODIUM 40 MG PO TBEC
40.0000 mg | DELAYED_RELEASE_TABLET | Freq: Every day | ORAL | Status: DC
  Administered 2020-09-27 – 2020-10-02 (×6): 40 mg via ORAL
  Filled 2020-09-27 (×7): qty 1

## 2020-09-27 MED ORDER — BISACODYL 10 MG RE SUPP
10.0000 mg | Freq: Every day | RECTAL | Status: DC | PRN
  Filled 2020-09-27: qty 1

## 2020-09-27 MED ORDER — SODIUM CHLORIDE FLUSH 0.9 % IV SOLN
INTRAVENOUS | Status: AC
  Filled 2020-09-27: qty 40

## 2020-09-27 MED ORDER — OXYCODONE HCL 5 MG PO TABS: 5.0000 mg | ORAL_TABLET | Freq: Once | ORAL | Status: DC | PRN

## 2020-09-27 MED ORDER — ALUM & MAG HYDROXIDE-SIMETH 200-200-20 MG/5ML PO SUSP: 30.0000 mL | ORAL | Status: DC | PRN

## 2020-09-27 MED ORDER — ONDANSETRON HCL 4 MG/2ML IJ SOLN: 4.0000 mg | Freq: Four times a day (QID) | INTRAMUSCULAR | Status: DC | PRN

## 2020-09-27 MED ORDER — METOCLOPRAMIDE HCL 10 MG PO TABS: 5.0000 mg | ORAL_TABLET | Freq: Three times a day (TID) | ORAL | Status: DC | PRN

## 2020-09-27 MED ORDER — SODIUM CHLORIDE 0.9 % IV SOLN: INTRAVENOUS | Status: DC

## 2020-09-27 MED ORDER — LIDOCAINE HCL (CARDIAC) PF 100 MG/5ML IV SOSY
PREFILLED_SYRINGE | INTRAVENOUS | Status: DC | PRN
  Administered 2020-09-27: 40 mg via INTRAVENOUS
  Administered 2020-09-27: 60 mg via INTRAVENOUS

## 2020-09-27 MED ORDER — ONDANSETRON HCL 4 MG PO TABS: 4.0000 mg | ORAL_TABLET | Freq: Four times a day (QID) | ORAL | Status: DC | PRN

## 2020-09-27 MED ORDER — FENTANYL CITRATE (PF) 100 MCG/2ML IJ SOLN
INTRAMUSCULAR | Status: AC
  Administered 2020-09-27: 25 ug via INTRAVENOUS
  Filled 2020-09-27: qty 2

## 2020-09-27 MED ORDER — NEOMYCIN-POLYMYXIN B GU 40-200000 IR SOLN
Status: DC | PRN
  Administered 2020-09-27: 4 mL

## 2020-09-27 MED ORDER — ENSURE ENLIVE PO LIQD
237.0000 mL | Freq: Two times a day (BID) | ORAL | Status: DC
  Administered 2020-09-28 – 2020-10-01 (×6): 237 mL via ORAL

## 2020-09-27 MED ORDER — FENTANYL CITRATE (PF) 100 MCG/2ML IJ SOLN
INTRAMUSCULAR | Status: AC
  Filled 2020-09-27: qty 2

## 2020-09-27 MED ORDER — OXYCODONE HCL 5 MG/5ML PO SOLN: 5.0000 mg | Freq: Once | ORAL | Status: DC | PRN

## 2020-09-27 MED ORDER — PROPOFOL 500 MG/50ML IV EMUL
INTRAVENOUS | Status: DC | PRN
  Administered 2020-09-27: 50 ug/kg/min via INTRAVENOUS

## 2020-09-27 MED ORDER — PROPOFOL 10 MG/ML IV BOLUS
INTRAVENOUS | Status: DC | PRN
  Administered 2020-09-27: 10 mg via INTRAVENOUS
  Administered 2020-09-27: 15 mg via INTRAVENOUS

## 2020-09-27 MED ORDER — BUPIVACAINE-EPINEPHRINE (PF) 0.25% -1:200000 IJ SOLN
INTRAMUSCULAR | Status: AC
  Filled 2020-09-27: qty 30

## 2020-09-27 MED ORDER — DIPHENHYDRAMINE HCL 12.5 MG/5ML PO ELIX
12.5000 mg | ORAL_SOLUTION | ORAL | Status: DC | PRN
Start: 2020-09-27 — End: 2020-10-02
  Filled 2020-09-27: qty 10

## 2020-09-27 MED ORDER — CEFAZOLIN SODIUM-DEXTROSE 1-4 GM/50ML-% IV SOLN
1.0000 g | Freq: Four times a day (QID) | INTRAVENOUS | Status: AC
  Administered 2020-09-27 – 2020-09-28 (×2): 1 g via INTRAVENOUS
  Filled 2020-09-27 (×2): qty 50

## 2020-09-27 MED ORDER — DOCUSATE SODIUM 100 MG PO CAPS
100.0000 mg | ORAL_CAPSULE | Freq: Two times a day (BID) | ORAL | Status: DC
  Filled 2020-09-27 (×4): qty 1

## 2020-09-27 MED ORDER — FENTANYL CITRATE (PF) 100 MCG/2ML IJ SOLN
INTRAMUSCULAR | Status: DC | PRN
  Administered 2020-09-27: 50 ug via INTRAVENOUS

## 2020-09-27 MED ORDER — ENOXAPARIN SODIUM 40 MG/0.4ML ~~LOC~~ SOLN
40.0000 mg | SUBCUTANEOUS | Status: DC
  Administered 2020-09-28 – 2020-10-02 (×5): 40 mg via SUBCUTANEOUS
  Filled 2020-09-27 (×5): qty 0.4

## 2020-09-27 MED ORDER — MAGNESIUM HYDROXIDE 400 MG/5ML PO SUSP: 30.0000 mL | Freq: Every day | ORAL | Status: DC | PRN

## 2020-09-27 MED ORDER — PHENYLEPHRINE HCL (PRESSORS) 10 MG/ML IV SOLN
INTRAVENOUS | Status: DC | PRN
  Administered 2020-09-27: 100 ug via INTRAVENOUS

## 2020-09-27 MED ORDER — BUPIVACAINE HCL (PF) 0.5 % IJ SOLN
INTRAMUSCULAR | Status: DC | PRN
  Administered 2020-09-27: 2.5 mL

## 2020-09-27 MED ORDER — JUVEN PO PACK
1.0000 | PACK | Freq: Two times a day (BID) | ORAL | Status: DC
  Administered 2020-09-28 – 2020-10-02 (×9): 1 via ORAL

## 2020-09-27 MED ORDER — ADULT MULTIVITAMIN W/MINERALS CH
1.0000 | ORAL_TABLET | Freq: Every day | ORAL | Status: DC
  Administered 2020-09-28 – 2020-10-02 (×5): 1 via ORAL
  Filled 2020-09-27 (×5): qty 1

## 2020-09-27 MED ORDER — METOCLOPRAMIDE HCL 5 MG/ML IJ SOLN: 5.0000 mg | Freq: Three times a day (TID) | INTRAMUSCULAR | Status: DC | PRN

## 2020-09-27 MED ORDER — PHENOL 1.4 % MT LIQD
1.0000 | OROMUCOSAL | Status: DC | PRN
  Filled 2020-09-27: qty 177

## 2020-09-27 MED ORDER — BUPIVACAINE-EPINEPHRINE 0.25% -1:200000 IJ SOLN
INTRAMUSCULAR | Status: DC | PRN
  Administered 2020-09-27: 30 mL

## 2020-09-27 MED ORDER — SODIUM CHLORIDE 0.9 % IV SOLN
INTRAVENOUS | Status: DC | PRN
  Administered 2020-09-27: 50 ug/min via INTRAVENOUS

## 2020-09-27 MED ORDER — CHLORHEXIDINE GLUCONATE CLOTH 2 % EX PADS
6.0000 | MEDICATED_PAD | Freq: Every day | CUTANEOUS | Status: DC
  Administered 2020-09-27 – 2020-09-30 (×2): 6 via TOPICAL

## 2020-09-27 SURGICAL SUPPLY — 65 items
APL PRP STRL LF DISP 70% ISPRP (MISCELLANEOUS) ×1
BLADE SAGITTAL AGGR TOOTH XLG (BLADE) ×2 IMPLANT
BNDG COHESIVE 6X5 TAN STRL LF (GAUZE/BANDAGES/DRESSINGS) ×6 IMPLANT
BOWL CEMENT MIXING ADV NOZZLE (MISCELLANEOUS) ×1 IMPLANT
CANISTER SUCT 1200ML W/VALVE (MISCELLANEOUS) ×2 IMPLANT
CANISTER WOUND CARE 500ML ATS (WOUND CARE) ×2 IMPLANT
CEMENT BONE 40GM (Cement) ×2 IMPLANT
CHLORAPREP W/TINT 26 (MISCELLANEOUS) ×2 IMPLANT
COVER BACK TABLE REUSABLE LG (DRAPES) ×2 IMPLANT
COVER WAND RF STERILE (DRAPES) ×2 IMPLANT
DRAPE 3/4 80X56 (DRAPES) ×6 IMPLANT
DRAPE C-ARM XRAY 36X54 (DRAPES) ×2 IMPLANT
DRAPE INCISE IOBAN 66X60 STRL (DRAPES) IMPLANT
DRAPE POUCH INSTRU U-SHP 10X18 (DRAPES) ×2 IMPLANT
DRESSING SURGICEL FIBRLLR 1X2 (HEMOSTASIS) ×2 IMPLANT
DRSG MEPILEX SACRM 8.7X9.8 (GAUZE/BANDAGES/DRESSINGS) ×2 IMPLANT
DRSG OPSITE POSTOP 4X8 (GAUZE/BANDAGES/DRESSINGS) ×4 IMPLANT
DRSG SURGICEL FIBRILLAR 1X2 (HEMOSTASIS) ×4
ELECT BLADE 6.5 EXT (BLADE) ×2 IMPLANT
ELECT REM PT RETURN 9FT ADLT (ELECTROSURGICAL) ×2
ELECTRODE REM PT RTRN 9FT ADLT (ELECTROSURGICAL) ×1 IMPLANT
GLOVE BIOGEL PI IND STRL 9 (GLOVE) ×1 IMPLANT
GLOVE BIOGEL PI INDICATOR 9 (GLOVE) ×1
GLOVE SURG SYN 9.0  PF PI (GLOVE) ×4
GLOVE SURG SYN 9.0 PF PI (GLOVE) ×2 IMPLANT
GOWN SRG 2XL LVL 4 RGLN SLV (GOWNS) ×1 IMPLANT
GOWN STRL NON-REIN 2XL LVL4 (GOWNS) ×2
GOWN STRL REUS W/ TWL LRG LVL3 (GOWN DISPOSABLE) ×1 IMPLANT
GOWN STRL REUS W/TWL LRG LVL3 (GOWN DISPOSABLE) ×2
HEAD BIPOLAR 43X22 MED (Head) ×1 IMPLANT
HEAD FEM S 22.2MM -2.5 0125124 (Head) ×1 IMPLANT
HEMOVAC 400CC 10FR (MISCELLANEOUS) IMPLANT
HOLDER FOLEY CATH W/STRAP (MISCELLANEOUS) ×2 IMPLANT
HOOD PEEL AWAY FLYTE STAYCOOL (MISCELLANEOUS) ×3 IMPLANT
IRRIGATION SURGIPHOR STRL (IV SOLUTION) IMPLANT
KIT PREVENA INCISION MGT 13 (CANNISTER) ×2 IMPLANT
MANIFOLD NEPTUNE II (INSTRUMENTS) ×2 IMPLANT
MAT ABSORB  FLUID 56X50 GRAY (MISCELLANEOUS) ×2
MAT ABSORB FLUID 56X50 GRAY (MISCELLANEOUS) ×1 IMPLANT
NDL SAFETY ECLIPSE 18X1.5 (NEEDLE) ×1 IMPLANT
NEEDLE HYPO 18GX1.5 SHARP (NEEDLE) ×2
NEEDLE SPNL 20GX3.5 QUINCKE YW (NEEDLE) ×4 IMPLANT
NS IRRIG 1000ML POUR BTL (IV SOLUTION) ×2 IMPLANT
PACK HIP COMPR (MISCELLANEOUS) ×2 IMPLANT
RESTRICTOR CEMENT PE SZ 2 (Cement) ×2 IMPLANT
SCALPEL PROTECTED #10 DISP (BLADE) ×4 IMPLANT
SOL PREP PVP 2OZ (MISCELLANEOUS) ×2
SOLUTION PREP PVP 2OZ (MISCELLANEOUS) ×1 IMPLANT
SPONGE DRAIN TRACH 4X4 STRL 2S (GAUZE/BANDAGES/DRESSINGS) ×2 IMPLANT
STAPLER SKIN PROX 35W (STAPLE) ×2 IMPLANT
STEM FEMORAL STD SZ1 (Stem) ×1 IMPLANT
STRAP SAFETY 5IN WIDE (MISCELLANEOUS) ×2 IMPLANT
SUT DVC 2 QUILL PDO  T11 36X36 (SUTURE) ×2
SUT DVC 2 QUILL PDO T11 36X36 (SUTURE) ×1 IMPLANT
SUT SILK 0 (SUTURE) ×2
SUT SILK 0 30XBRD TIE 6 (SUTURE) ×1 IMPLANT
SUT V-LOC 90 ABS DVC 3-0 CL (SUTURE) ×2 IMPLANT
SUT VIC AB 1 CT1 36 (SUTURE) ×3 IMPLANT
SYR 20ML LL LF (SYRINGE) ×2 IMPLANT
SYR 30ML LL (SYRINGE) ×2 IMPLANT
SYR 50ML LL SCALE MARK (SYRINGE) ×4 IMPLANT
SYR BULB IRRIG 60ML STRL (SYRINGE) ×2 IMPLANT
TAPE MICROFOAM 4IN (TAPE) ×1 IMPLANT
TOWEL OR 17X26 4PK STRL BLUE (TOWEL DISPOSABLE) ×2 IMPLANT
TRAY FOLEY MTR SLVR 16FR STAT (SET/KITS/TRAYS/PACK) ×2 IMPLANT

## 2020-09-27 NOTE — Anesthesia Procedure Notes (Signed)
Date/Time: 09/27/2020 3:55 PM Performed by: Nelda Marseille, CRNA Pre-anesthesia Checklist: Patient identified, Emergency Drugs available, Suction available, Patient being monitored and Timeout performed Oxygen Delivery Method: Simple face mask

## 2020-09-27 NOTE — Progress Notes (Signed)
Initial Nutrition Assessment  DOCUMENTATION CODES:   Not applicable  INTERVENTION:  Ensure Enlive po BID, each supplement provides 350 kcal and 20 grams of protein (chocolate)  1 packet Juven BID, each packet provides 95 calories, 2.5 grams of protein (collagen), and 9.8 grams of carbohydrate (3 grams sugar); also contains 7 grams of L-arginine and L-glutamine, 300 mg vitamin C, 15 mg vitamin E, 1.2 mcg vitamin B-12, 9.5 mg zinc, 200 mg calcium, and 1.5 g  Calcium Beta-hydroxy-Beta-methylbutyrate to support post-op wound healing    NUTRITION DIAGNOSIS:   Increased nutrient needs related to post-op healing,hip fracture as evidenced by estimated needs.  GOAL:   Patient will meet greater than or equal to 90% of their needs    MONITOR:   Weight trends,Labs,I & O's,Diet advancement,Supplement acceptance,PO intake,Skin  REASON FOR ASSESSMENT:   Consult Assessment of nutrition requirement/status (hip fx)  ASSESSMENT:  85 year old female admitted for subcapital fracture of right femur presenting with right hip pain following a fall at home after slipping on ice while getting into the car. Past medical history significant of anxiety, cellulitis, compression fx of L4, constipation, diverticulosis, idiopathic pulmonary fibrosis, HLD, HTN, osteopenia, sleep apnea, and vit D deficiency.  RD working remotely.  Right hemiarthroplasty planned for today  Spoke with patient via phone, reports doing well and surgery planned for this afternoon. Pt reports varying appetite/intake at home, recalls a piece of for breakfast and a bowl of oatmeal around 10:30 AM. She is unable to prepare meals d/t limited mobility, noted chronic sciatica on chronic Dilaudid 4 mg 3x/day. She snacks on peanut butter crackers, says sometimes her son or husband will cook or bring food home from [employer] or [employer]. Occasionally, pt will drink a chocolate Boost. RD educated on increased needs and the importance of protein to  support post-op healing. Pt would prefer Boost, but is agreeable to drinking chocolate Ensure to help meet her needs.   Per chart, weights are up ~16 lbs from usual weight. Currently, pt weighs 62.2 kg (136.84 lbs) which is a bed wt per flowsheet. Suspect this is inaccurate given she weighed 54.4 kg on 12/09 as well as on 12/3 and weights stable 54.4 kg -57.8 kg over the last 7 months.   Medications reviewed and include: Celexa, Dilaudid, Senokot, Ancef  Labs reviewed  NUTRITION - FOCUSED PHYSICAL EXAM: Unable to complete at this time   Diet Order:   Diet Order            Diet 2 gram sodium Room service appropriate? Yes; Fluid consistency: Thin  Diet effective now                 EDUCATION NEEDS:   Education needs have been addressed  Skin:  Skin Assessment: Reviewed RN Assessment (abraison; right leg; scattered ecchymosis)  Last BM:  1/26  Height:   Ht Readings from Last 1 Encounters:  09/26/20 5' (1.524 m)    Weight:   Wt Readings from Last 1 Encounters:  09/27/20 62.2 kg    BMI:  Body mass index is 26.8 kg/m.  Estimated Nutritional Needs:   Kcal:  1550-1750  Protein:  80-90  Fluid:  >1.3 L   Lajuan Lines, RD, LDN Clinical Nutrition After Hours/Weekend Pager # in [place]

## 2020-09-27 NOTE — Anesthesia Procedure Notes (Signed)
Spinal  Patient location during procedure: OR Start time: 09/27/2020 3:25 PM End time: 09/27/2020 3:29 PM Staffing Performed: resident/CRNA  Resident/CRNA: Nelda Marseille, CRNA Preanesthetic Checklist Completed: patient identified, IV checked, site marked, risks and benefits discussed, surgical consent, monitors and equipment checked, pre-op evaluation and timeout performed Spinal Block Patient position: sitting Prep: Betadine Patient monitoring: heart rate, continuous pulse ox, blood pressure and cardiac monitor Approach: midline Location: L3-4 Injection technique: single-shot Needle Needle type: Introducer and Quincke  Needle gauge: 24 G Needle length: 9 cm Assessment Sensory level: T10 Additional Notes Negative paresthesia. Negative blood return. Positive free-flowing CSF. Expiration date of kit checked and confirmed. Patient tolerated procedure well, without complications.

## 2020-09-27 NOTE — Transfer of Care (Signed)
Immediate Anesthesia Transfer of Care Note  Patient: [name]  Procedure(s) Performed: ARTHROPLASTY BIPOLAR HIP (HEMIARTHROPLASTY) (Right Hip)  Patient Location: PACU  Anesthesia Type:Spinal  Level of Consciousness: awake, alert  and oriented  Airway & Oxygen Therapy: Patient Spontanous Breathing and Patient connected to face mask oxygen  Post-op Assessment: Report given to RN and Post -op Vital signs reviewed and stable  Post vital signs: Reviewed and stable  Last Vitals:  Vitals Value Taken Time  BP 105/66 09/27/20 1700  Temp 36.2 C 09/27/20 1700  Pulse 94 09/27/20 1705  Resp 13 09/27/20 1707  SpO2 94 % 09/27/20 1705  Vitals shown include unvalidated device data.  Last Pain:  Vitals:   09/27/20 1700  TempSrc: Temporal  PainSc: Asleep      Patients Stated Pain Goal: 0 (63/84/66 5993)  Complications: No complications documented.

## 2020-09-27 NOTE — Progress Notes (Signed)
Pt back from surgery, alert and oriented, wound vac in place, no acute distress noted, vitals wnl. Staff will continue to monitor pt.

## 2020-09-27 NOTE — Progress Notes (Addendum)
PROGRESS NOTE    SANOE HAZAN  DXA:[id number] DOB: 1936/08/30 DOA: 09/26/2020 PCP: [name], NP   Brief Narrative: Taken from H&P. [name] is a 85 y.o. female with medical history significant for pulmonary fibrosis on 2 L of oxygen at night, chronic low back pain, anxiety who presents to the ER via EMS for evaluation of right hip pain following a fall.  Patient had an appointment and was trying to get into her car when she tripped on ice and fell.  Imaging positive for right subcapital femoral neck fracture.  Orthopedic was consulted and patient will be going to the OR later today.  Subjective: Patient was complaining of 10 out of 10 pain when seen today.  She was being cleaned by nursing staff after having a bowel movement.  Any movement around right hip is very painful.  Assessment & Plan:   Principal Problem:   Subcapital fracture of femur, right, closed, initial encounter ([place]) Active Problems:   Anxiety   HTN (hypertension)   Idiopathic pulmonary fibrosis ([place])  Subcapital right femoral neck fracture.  Secondary to mechanical fall. Orthopedic is going to take her to the OR later today. -Continue with pain management -Follow-up postoperative recommendations -PT evaluation tomorrow after surgery.  Idiopathic pulmonary fibrosis.  Patient uses 2 L of oxygen at night. -Continue supplemental oxygen. -Continue home dose of pirfenidone  Anxiety/depression.  No acute concern. -Continue Celexa  Objective: Vitals:   09/27/20 0516 09/27/20 0817 09/27/20 1300 09/27/20 1432  BP: (!) 128/97 136/71 (!) 124/57 122/73  Pulse: 84 85 86 81  Resp: 17 20 17 16   Temp: 98.3 F (36.8 C) 99.1 F (37.3 C) 98.4 F (36.9 C) 98.8 F (37.1 C)  TempSrc: Oral Oral Oral Tympanic  SpO2: 92% 92% 94% 97%  Weight:    62.2 kg  Height:    5' (1.524 m)    Intake/Output Summary (Last 24 hours) at 09/27/2020 1627 Last data filed at 09/27/2020 0330 Gross per 24 hour  Intake 120  ml  Output --  Net 120 ml   Filed Weights   09/26/20 2220 09/27/20 0300 09/27/20 1432  Weight: 62.2 kg 62.2 kg 62.2 kg    Examination:  General exam: Appears calm and comfortable  Respiratory system: Clear to auscultation. Respiratory effort normal. Cardiovascular system: S1 & S2 heard, RRR.  Gastrointestinal system: Soft, nontender, nondistended, bowel sounds positive. Central nervous system: Alert and oriented. No focal neurological deficits. Extremities: No edema, no cyanosis, pulses intact and symmetrical, right extremity externally rotated. Psychiatry: Judgement and insight appear normal.     DVT prophylaxis: SCDs Code Status: Full Family Communication: Discussed with patient Disposition Plan:  Status is: Inpatient  Remains inpatient appropriate because:Inpatient level of care appropriate due to severity of illness   Dispo: The patient is from: Home              Anticipated d/c is to: To be determined              Anticipated d/c date is: 2 days              Patient currently is not medically stable to d/c.   Difficult to place patient No              Level of care: Progressive Cardiac  Consultants:   Orthopedic surgery  Procedures:  Antimicrobials:   Data Reviewed: I have personally reviewed following labs and imaging studies  CBC: Recent Labs  Lab 09/26/20 1029  WBC 12.4*  NEUTROABS 9.3*  HGB 13.5  HCT 41.8  MCV 95.0  PLT 726   Basic Metabolic Panel: Recent Labs  Lab 09/21/20 1320 09/26/20 1029  NA 139 136  K 4.1 3.6  CL 100 101  CO2 23 23  GLUCOSE 101* 96  BUN 19 16  CREATININE 0.83 0.69  CALCIUM 9.7 8.5*   GFR: Estimated Creatinine Clearance: 44.7 mL/min (by C-G formula based on SCr of 0.69 mg/dL). Liver Function Tests: Recent Labs  Lab 09/26/20 1029  AST 31  ALT 18  ALKPHOS 58  BILITOT 0.8  PROT 7.0  ALBUMIN 3.6   No results for input(s): LIPASE, AMYLASE in the last 168 hours. No results for input(s): AMMONIA in the last  168 hours. Coagulation Profile: No results for input(s): INR, PROTIME in the last 168 hours. Cardiac Enzymes: No results for input(s): CKTOTAL, CKMB, CKMBINDEX, TROPONINI in the last 168 hours. BNP (last 3 results) No results for input(s): PROBNP in the last 8760 hours. HbA1C: No results for input(s): HGBA1C in the last 72 hours. CBG: No results for input(s): GLUCAP in the last 168 hours. Lipid Profile: No results for input(s): CHOL, HDL, LDLCALC, TRIG, CHOLHDL, LDLDIRECT in the last 72 hours. Thyroid Function Tests: No results for input(s): TSH, T4TOTAL, FREET4, T3FREE, THYROIDAB in the last 72 hours. Anemia Panel: No results for input(s): VITAMINB12, FOLATE, FERRITIN, TIBC, IRON, RETICCTPCT in the last 72 hours. Sepsis Labs: No results for input(s): PROCALCITON, LATICACIDVEN in the last 168 hours.  Recent Results (from the past 240 hour(s))  SARS Coronavirus 2 by RT PCR (hospital order, performed in [facility] hospital lab) Nasopharyngeal Nasopharyngeal Swab     Status: None   Collection Time: 09/26/20 12:02 PM   Specimen: Nasopharyngeal Swab  Result Value Ref Range Status   SARS Coronavirus 2 NEGATIVE NEGATIVE Final    Comment: (NOTE) SARS-CoV-2 target nucleic acids are NOT DETECTED.  The SARS-CoV-2 RNA is generally detectable in upper and lower respiratory specimens during the acute phase of infection. The lowest concentration of SARS-CoV-2 viral copies this assay can detect is 250 copies / mL. A negative result does not preclude SARS-CoV-2 infection and should not be used as the sole basis for treatment or other patient management decisions.  A negative result may occur with improper specimen collection / handling, submission of specimen other than nasopharyngeal swab, presence of viral mutation(s) within the areas targeted by this assay, and inadequate number of viral copies (<250 copies / mL). A negative result must be combined with clinical observations, patient  history, and epidemiological information.  Fact Sheet for Patients:   StrictlyIdeas.no  Fact Sheet for Healthcare Providers: BankingDealers.co.za  This test is not yet approved or  cleared by the [place] FDA and has been authorized for detection and/or diagnosis of SARS-CoV-2 by FDA under an Emergency Use Authorization (EUA).  This EUA will remain in effect (meaning this test can be used) for the duration of the COVID-19 declaration under Section 564(b)(1) of the Act, 21 U.S.C. section 360bbb-3(b)(1), unless the authorization is terminated or revoked sooner.  Performed at [facility], [place], [place] [address]   C Difficile Quick Screen w PCR reflex     Status: Abnormal   Collection Time: 09/27/20 10:35 AM   Specimen: STOOL  Result Value Ref Range Status   C Diff antigen POSITIVE (A) NEGATIVE Final   C Diff toxin NEGATIVE NEGATIVE Final   C Diff interpretation Results are indeterminate. See PCR  results.  Final    Comment: Performed at [facility], [address] [place], [place] [address]  C. Diff by PCR, Reflexed     Status: Abnormal   Collection Time: 09/27/20 10:35 AM  Result Value Ref Range Status   Toxigenic C. Difficile by PCR POSITIVE (A) NEGATIVE Final    Comment: Positive for toxigenic C. difficile with little to no toxin production. Only treat if clinical presentation suggests symptomatic illness. Performed at [facility], [address] [place], [place] [address]      Radiology Studies: DG Chest 1 View  Result Date: 09/26/2020 CLINICAL DATA:  Fall.  Leg pain. EXAM: CHEST  1 VIEW COMPARISON:  08/15/2020 FINDINGS: Midline trachea. Mild cardiomegaly, accentuated by low lung volumes and AP portable technique. Basilar predominant subpleural interstitial thickening. Similar. No lobar consolidation. Osteopenia. Left shoulder arthroplasty. Thoracolumbar vertebral  augmentation. IMPRESSION: No acute cardiopulmonary disease. Peripheral and basilar predominant interstitial thickening, suspicious for interstitial lung disease. Please see 08/03/2019 chest CT report. Electronically Signed   By: Abigail Miyamoto M.D.   On: 09/26/2020 11:28   CT Head Wo Contrast  Result Date: 09/26/2020 CLINICAL DATA:  RIGHT hip pain, fall without reported loss of consciousness, reportedly hit LEFT side of head. EXAM: CT HEAD WITHOUT CONTRAST TECHNIQUE: Contiguous axial images were obtained from the base of the skull through the vertex without intravenous contrast. COMPARISON:  Prior exam from May 29, 2020 FINDINGS: Brain: No evidence of acute infarction, hemorrhage, hydrocephalus, extra-axial collection or mass lesion/mass effect. Signs of atrophy and mild chronic microvascular ischemic change as before. Vascular: No hyperdense vessel or unexpected calcification. Skull: Normal. Negative for fracture or focal lesion. Sinuses/Orbits: Visualized paranasal sinuses and orbits are unremarkable. Other: None. IMPRESSION: 1. No acute intracranial pathology. 2. Signs of atrophy and mild chronic microvascular ischemic change as before. Electronically Signed   By: Zetta Bills M.D.   On: 09/26/2020 11:45   DG Femur Min 2 Views Right  Result Date: 09/26/2020 CLINICAL DATA:  Recent fall with hip pain, initial encounter EXAM: RIGHT FEMUR 2 VIEWS COMPARISON:  None. FINDINGS: Subcapital femoral neck fracture is noted with mild impaction at the fracture site. No other focal abnormality is seen. IMPRESSION: Subcapital femoral neck fracture. Electronically Signed   By: Inez Catalina M.D.   On: 09/26/2020 11:27    Scheduled Meds: . [MAR Hold] citalopram  40 mg Oral Daily  . [MAR Hold] feeding supplement  237 mL Oral BID BM  . [MAR Hold] HYDROmorphone  4 mg Oral TID  . [MAR Hold] multivitamin with minerals  1 tablet Oral Daily  . [MAR Hold] nutrition supplement (JUVEN)  1 packet Oral BID BM  . [MAR  Hold] senna  1 tablet Oral BID  . [MAR Hold] sulfaSALAzine  500 mg Oral BID   Continuous Infusions: . ceFAZolin    . [MAR Hold]  ceFAZolin (ANCEF) IV    . [MAR Hold] methocarbamol (ROBAXIN) IV       LOS: 1 day   Time spent: 35 minutes  Lorella Nimrod, MD [facility]  If 7PM-7AM, please contact night-coverage [email or web address]  09/27/2020, 4:27 PM   This record has been created using Systems analyst. Errors have been sought and corrected,but may not always be located. Such creation errors do not reflect on the standard of care.

## 2020-09-27 NOTE — Anesthesia Preprocedure Evaluation (Signed)
Anesthesia Evaluation  Patient identified by MRN, date of birth, ID band Patient awake    Reviewed: Allergy & Precautions, H&P , NPO status , Patient's Chart, lab work & pertinent test results  History of Anesthesia Complications Negative for: history of anesthetic complications  Airway Mallampati: III  TM Distance: <3 FB Neck ROM: limited    Dental  (+) Chipped, Poor Dentition   Pulmonary shortness of breath and with exertion, sleep apnea , pneumonia, COPD,  COPD inhaler and oxygen dependent,    Pulmonary exam normal        Cardiovascular Exercise Tolerance: Poor hypertension, negative cardio ROS Normal cardiovascular exam     Neuro/Psych PSYCHIATRIC DISORDERS negative neurological ROS     GI/Hepatic negative GI ROS, Neg liver ROS, neg GERD  ,  Endo/Other  negative endocrine ROS  Renal/GU      Musculoskeletal   Abdominal   Peds  Hematology negative hematology ROS (+)   Anesthesia Other Findings Past Medical History: No date: Altered mental state No date: Anxiety No date: BMI 30.0-30.9,adult No date: Cellulitis No date: Chest pain, atypical No date: Compression fracture of L4 lumbar vertebra No date: Constipation No date: Cystitis No date: Cystocele No date: Diverticulosis No date: Fatigue No date: Fibrosis, idiopathic pulmonary (HCC) No date: Gastroenteritis No date: History of back surgery No date: History of herniated intervertebral disc No date: HTN (hypertension) No date: Hypercholesteremia No date: Hypocalcemia No date: Incontinence of urine No date: OA (osteoarthritis)     Comment:  shoulder and back No date: Obesity No date: Osteopenia No date: Pneumonia No date: Shortness of breath No date: Sleep apnea 07/16/2016: Squamous cell carcinoma of skin     Comment:  Right upper lateral eyebrow. SCCis. 01/13/2018: Squamous cell carcinoma of skin     Comment:  Left temporal hairline. WD SCC  with superficial               infiltration. No date: Thrush No date: Vitamin D deficiency  Past Surgical History: No date: BACK SURGERY 04/24/2016: CATARACT EXTRACTION W/PHACO; Left     Comment:  Procedure: CATARACT EXTRACTION PHACO AND INTRAOCULAR               LENS PLACEMENT (IOC);  Surgeon: Leandrew Koyanagi, MD;               Location: [place];  Service: Ophthalmology;                Laterality: Left; 07/14/2017: CATARACT EXTRACTION W/PHACO; Right     Comment:  Procedure: CATARACT EXTRACTION PHACO AND INTRAOCULAR               LENS PLACEMENT ([place])  RIGHT;  Surgeon: Leandrew Koyanagi, MD;  Location: [facility];  Service:               Ophthalmology;  Laterality: Right; No date: CHOLECYSTECTOMY 10/13/2018: ESOPHAGOGASTRODUODENOSCOPY (EGD) WITH PROPOFOL; N/A     Comment:  Procedure: ESOPHAGOGASTRODUODENOSCOPY (EGD) WITH               PROPOFOL;  Surgeon: [name], MD;  Location: [facility]               ENDOSCOPY;  Service: Gastroenterology;  Laterality: N/A; 10/13/2018: FLEXIBLE SIGMOIDOSCOPY; N/A     Comment:  Procedure: FLEXIBLE SIGMOIDOSCOPY;  Surgeon: [name], MD;  Location: [facility] ENDOSCOPY;  Service:               Gastroenterology;  Laterality: N/A; 09/09/2019: FLEXIBLE SIGMOIDOSCOPY; N/A     Comment:  Procedure: FLEXIBLE SIGMOIDOSCOPY;  Surgeon: [name], MD;  Location[facility] ENDOSCOPY;  Service:               Gastroenterology;  Laterality: N/A; 08/04/2020: FLEXIBLE SIGMOIDOSCOPY; N/A     Comment:  Procedure: FLEXIBLE SIGMOIDOSCOPY;  Surgeon: [name], MD;  Location: [facility] ENDOSCOPY;  Service:               Gastroenterology;  Laterality: N/A;  Per Dr. Vicente Males,               unsedated No date: ROTATOR CUFF REPAIR; Bilateral     Comment:  left-12/2009; right-03/2009 dr.[email or web address] No date: TONSILLECTOMY No date: TOTAL SHOULDER REPLACEMENT; Left  BMI    Body Mass Index: 26.78 kg/m       Reproductive/Obstetrics negative OB ROS                             Anesthesia Physical Anesthesia Plan  ASA: IV  Anesthesia Plan: Spinal   Post-op Pain Management:    Induction:   PONV Risk Score and Plan:   Airway Management Planned: Natural Airway and Nasal Cannula  Additional Equipment:   Intra-op Plan:   Post-operative Plan:   Informed Consent: I have reviewed the patients History and Physical, chart, labs and discussed the procedure including the risks, benefits and alternatives for the proposed anesthesia with the patient or authorized representative who has indicated his/her understanding and acceptance.     Dental Advisory Given  Plan Discussed with: Anesthesiologist, CRNA and Surgeon  Anesthesia Plan Comments: (Patient reports no bleeding problems and no anticoagulant use.  Plan for spinal with backup GA  Patient consented for risks of anesthesia including but not limited to:  - adverse reactions to medications - damage to eyes, teeth, lips or other oral mucosa - nerve damage due to positioning  - risk of bleeding, infection and or nerve damage from spinal that could lead to paralysis - risk of headache or failed spinal - damage to teeth, lips or other oral mucosa - sore throat or hoarseness - damage to heart, brain, nerves, lungs, other parts of body or loss of life  Patient voiced understanding.)        Anesthesia Quick Evaluation

## 2020-09-27 NOTE — Progress Notes (Signed)
Discussed surgery in detail with patient and her husband, post op expectation.

## 2020-09-27 NOTE — Op Note (Signed)
09/27/2020  5:12 PM  PATIENT:  [name]  85 y.o. female  PRE-OPERATIVE DIAGNOSIS:  Hip Fracture right femoral neck  POST-OPERATIVE DIAGNOSIS:  Hip Fracture right femoral neck  PROCEDURE:  Procedure(s): ARTHROPLASTY BIPOLAR HIP (HEMIARTHROPLASTY) (Right)  SURGEON: Laurene Footman, MD  ASSISTANTS: None  ANESTHESIA:   spinal  EBL:  Total I/O In: 600 [I.V.:600] Out: 525 [Urine:450; Blood:75]  BLOOD ADMINISTERED:none  DRAINS: Incisional wound VAC   LOCAL MEDICATIONS USED:  MARCAINE     SPECIMEN:  Source of Specimen:  Femoral head and neck  DISPOSITION OF SPECIMEN:  PATHOLOGY  COUNTS:  YES  TOURNIQUET:  * No tourniquets in log *  IMPLANTS: Medacta cemented AMIS 1 stem with S 22.2 mm head and 43 mm bipolar head  DICTATION: .Dragon Dictation   The patient was brought to the operating room and after spinal anesthesia was obtained patient was placed on the operative table with the ipsilateral foot into the Medacta attachment, contralateral leg on a well-padded table. C-arm was brought in and preop template x-ray taken. After prepping and draping in usual sterile fashion appropriate patient identification and timeout procedures were completed. Anterior approach to the hip was obtained and centered over the greater trochanter and TFL muscle. The subcutaneous tissue was incised hemostasis being achieved by electrocautery. TFL fascia was incised and the muscle retracted laterally deep retractor placed. The lateral femoral circumflex vessels were identified and ligated. The anterior capsule was exposed and a capsulotomy performed. The neck was identified and a femoral neck cut carried out with a saw below the level of the subcapital hip fracture. The head was removed without difficulty and showed no significant degenerative changes.  The head measured approximately 42 mm and trial 43 mm fit well and was chosen for final component.  The leg was then externally rotated and ischiofemoral  and pubofemoral releases carried out. The femur was sequentially broached to a size 2.The 1 cemented stem was inserted after placing cement restrictor and drying the canal along with a S 22.2 mm head and 43 mm bipolar  liner. The hip was reduced and was stable the wound was thoroughly irrigated with fibrillar placed along the posterior capsule and medial neck. The deep fascia ws closed using a heavy Quill after infiltration of 30 cc of quarter percent Sensorcaine with epinephrine.3-0 V-loc to close the skin with skin staples.  Incisional wound VAC applied and patient was sent to recovery in stable condition.   PLAN OF CARE: Admit to inpatient

## 2020-09-27 NOTE — Anesthesia Postprocedure Evaluation (Signed)
Anesthesia Post Note  Patient: [name]  Procedure(s) Performed: ARTHROPLASTY BIPOLAR HIP (HEMIARTHROPLASTY) (Right Hip)  Patient location during evaluation: PACU Anesthesia Type: Spinal Level of consciousness: oriented and awake and alert Pain management: pain level controlled Vital Signs Assessment: post-procedure vital signs reviewed and stable Respiratory status: spontaneous breathing, respiratory function stable and patient connected to nasal cannula oxygen Cardiovascular status: blood pressure returned to baseline and stable Postop Assessment: no headache, no backache and no apparent nausea or vomiting Anesthetic complications: no   No complications documented.   Last Vitals:  Vitals:   09/27/20 1730 09/27/20 1745  BP: 129/80 112/83  Pulse: 89 88  Resp: 13 19  Temp:    SpO2: 94% 93%    Last Pain:  Vitals:   09/27/20 1745  TempSrc:   PainSc: 7                  Arita Miss

## 2020-09-28 ENCOUNTER — Encounter: Payer: Self-pay | Admitting: Orthopedic Surgery

## 2020-09-28 LAB — BASIC METABOLIC PANEL
Anion gap: 9 (ref 5–15)
BUN: 21 mg/dL (ref 8–23)
CO2: 24 mmol/L (ref 22–32)
Calcium: 7.8 mg/dL — ABNORMAL LOW (ref 8.9–10.3)
Chloride: 103 mmol/L (ref 98–111)
Creatinine, Ser: 0.62 mg/dL (ref 0.44–1.00)
GFR, Estimated: >60 mL/min (ref >60)
Glucose, Bld: 93 mg/dL (ref 70–99)
Potassium: 3.4 mmol/L — ABNORMAL LOW (ref 3.5–5.1)
Sodium: 136 mmol/L (ref 135–145)

## 2020-09-28 LAB — CBC
HCT: 36.4 % (ref 36.0–46.0)
Hemoglobin: 11.4 g/dL — ABNORMAL LOW (ref 12.0–15.0)
MCH: 30.2 pg (ref 26.0–34.0)
MCHC: 31.3 g/dL (ref 30.0–36.0)
MCV: 96.3 fL (ref 80.0–100.0)
Platelets: 135 10*3/uL — ABNORMAL LOW (ref 150–400)
RBC: 3.78 MIL/uL — ABNORMAL LOW (ref 3.87–5.11)
RDW: 13.9 % (ref 11.5–15.5)
WBC: 9 10*3/uL (ref 4.0–10.5)
nRBC: 0 % (ref 0.0–0.2)

## 2020-09-28 MED ORDER — POTASSIUM CHLORIDE CRYS ER 20 MEQ PO TBCR
40.0000 meq | EXTENDED_RELEASE_TABLET | Freq: Once | ORAL | Status: AC
  Administered 2020-09-28: 40 meq via ORAL
  Filled 2020-09-28: qty 2

## 2020-09-28 NOTE — Progress Notes (Signed)
Physical Therapy Treatment Patient Details Name: [name] MRN: [id number] DOB: March 31, 1936 Today's Date: 09/28/2020    History of Present Illness Pt admitted for subcapital fx of femur secondary to fall on ice and now s/p R ant hip hemi replacement on 1/26. History includes anxiety, HTN, pulm fibrosis (2L of O2 at night), and chronic LBP.    PT Comments    Pt is making gradual progress towards goals with ability to ambulate short distance from chair->bed. Need encouragement and cues for safety/sequencing. Good endurance with there-ex. Doesn't appear as anxious this session and decreased pain. Does have nausea symptoms with exertion. Will continue to progress towards goals.    Follow Up Recommendations  SNF     Equipment Recommendations  None recommended by PT    Recommendations for Other Services       Precautions / Restrictions Precautions Precautions: Anterior Hip;Fall Precaution Booklet Issued: No Restrictions Weight Bearing Restrictions: Yes RLE Weight Bearing: Weight bearing as tolerated    Mobility  Bed Mobility Overal bed mobility: Needs Assistance Bed Mobility: Sit to Supine     Supine to sit: Mod assist Sit to supine: Mod assist   General bed mobility comments: Prior to transfer, able to perform lateral scoot towards HOB. Pt needs assist for moving B LE back into bed.  Transfers Overall transfer level: Needs assistance Equipment used: Rolling walker (2 wheeled) Transfers: Sit to/from [employer] Sit to Stand: Mod assist Stand pivot transfers: Mod assist;Min assist       General transfer comment: cues for pushing from seated surface.Once standing, cues for upright posture and able to stand while therapist wipes bottom due to BM.  Ambulation/Gait Ambulation/Gait assistance: Min assist Gait Distance (Feet): 5 Feet Assistive device: Rolling walker (2 wheeled) Gait Pattern/deviations: Step-to pattern     General Gait Details:  becomes anxious with mobility efforts and reports stomach pain and nausea. Very slow and effortful movement.   Stairs             Wheelchair Mobility    Modified Rankin (Stroke Patients Only)       Balance Overall balance assessment: Needs assistance Sitting-balance support: Bilateral upper extremity supported;Feet supported Sitting balance-Leahy Scale: Fair     Standing balance support: During functional activity Standing balance-Leahy Scale: Poor                              Cognition Arousal/Alertness: Awake/alert Behavior During Therapy: WFL for tasks assessed/performed Overall Cognitive Status: Within Functional Limits for tasks assessed                                        Exercises Other Exercises Other Exercises: Seated ther-ex performed on B LE including AP, quad sets, SLRs, hip abd/add, and LAQ. All ther-ex performed x 10 reps with mod assist    General Comments        Pertinent Vitals/Pain Pain Assessment: Faces Pain Score: 7  Faces Pain Scale: Hurts a little bit Pain Location: R hip Pain Descriptors / Indicators: Discomfort Pain Intervention(s): Limited activity within patient's tolerance;Premedicated before session    Home Living Family/patient expects to be discharged to:: Private residence Living Arrangements: Spouse/significant other Available Help at Discharge: Family;Available PRN/intermittently Type of Home: House Home Access: Level entry   Home Layout: One level Home Equipment: Walker - 2 wheels;Cane - single  point Additional Comments: Pt is uses 2L of O2 at night    Prior Function Level of Independence: Independent      Comments: reports no falls, household ambulator   PT Goals (current goals can now be found in the care plan section) Acute Rehab PT Goals Patient Stated Goal: to go home PT Goal Formulation: With patient Time For Goal Achievement: 10/12/20 Potential to Achieve Goals:  Good Additional Goals Additional Goal #1: Pt will be able to perform bed mobility/transfers with cga and RW in order to improve functional independence Progress towards PT goals: Progressing toward goals    Frequency    BID      PT Plan Current plan remains appropriate    Co-evaluation              AM-PAC PT "6 Clicks" Mobility   Outcome Measure  Help needed turning from your back to your side while in a flat bed without using bedrails?: A Little Help needed moving from lying on your back to sitting on the side of a flat bed without using bedrails?: A Little Help needed moving to and from a bed to a chair (including a wheelchair)?: A Lot Help needed standing up from a chair using your arms (e.g., wheelchair or bedside chair)?: A Lot Help needed to walk in hospital room?: A Lot Help needed climbing 3-5 steps with a railing? : Total 6 Click Score: 13    End of Session Equipment Utilized During Treatment: Gait belt;Oxygen Activity Tolerance: Patient tolerated treatment well Patient left: in bed;with bed alarm set Nurse Communication: Mobility status PT Visit Diagnosis: Muscle weakness (generalized) (M62.81);Difficulty in walking, not elsewhere classified (R26.2);History of falling (Z91.81);Pain Pain - Right/Left: Right Pain - part of body: Hip     Time: 0981-1914 PT Time Calculation (min) (ACUTE ONLY): 26 min  Charges:  $Gait Training: 8-22 mins $Therapeutic Exercise: 8-22 mins                     Elizabeth Palau, PT, DPT [phone]    [name] 09/28/2020, 4:43 PM

## 2020-09-28 NOTE — Evaluation (Addendum)
Occupational Therapy Evaluation Patient Details Name: [name] MRN: [id number] DOB: February 17, 1936 Today's Date: 09/28/2020    History of Present Illness Pt admitted for subcapital fx of femur secondary to fall on ice and now s/p R ant hip hemi replacement on 1/26. History includes anxiety, HTN, pulm fibrosis (2L of O2 at night), and chronic LBP.   Clinical Impression   Patient presenting with decreased I with self care, balance, functional transfer/mobility, endurance, and strength. Patient reports living with husband and son ( who works during the day) PTA. Pt is very active ,drives, and performs all IADL tasks at home.  Patient currently functioning at mod A overall for self care and balance. Pt having difficulty weight shifting in order to pick up R LE and take steps.  Patient will benefit from acute OT to increase overall independence in the areas of ADLs, functional mobility, and safety awareness in order to safely discharge to next venue of care.    Follow Up Recommendations  SNF;Supervision/Assistance - 24 hour    Equipment Recommendations  Other (comment) (defer to next venue of care)       Precautions / Restrictions Precautions Precautions: Anterior Hip;Fall Precaution Booklet Issued: No Restrictions Weight Bearing Restrictions: Yes RLE Weight Bearing: Weight bearing as tolerated      Mobility Bed Mobility Overal bed mobility: Needs Assistance Bed Mobility: Supine to Sit     Supine to sit: Mod assist     General bed mobility comments: mod A for R LE and trunk to EOB    Transfers Overall transfer level: Needs assistance Equipment used: Rolling walker (2 wheeled) Transfers: Sit to/from [employer] Sit to Stand: Mod assist Stand pivot transfers: Mod assist;Min assist       General transfer comment: mod lifting assistance to stand from standard height bed. Mod A progressing to min A for functional transfer. Pt unable to weight shift to take  steps with R LE    Balance Overall balance assessment: Needs assistance Sitting-balance support: Bilateral upper extremity supported;Feet supported Sitting balance-Leahy Scale: Fair     Standing balance support: During functional activity Standing balance-Leahy Scale: Poor                             ADL either performed or assessed with clinical judgement   ADL Overall ADL's : Needs assistance/impaired     Grooming: Wash/dry hands;Wash/dry face;Supervision/safety;Set up;Sitting                   Toilet Transfer: Moderate assistance;RW;BSC                   Vision Baseline Vision/History: Wears glasses Wears Glasses: At all times Patient Visual Report: No change from baseline              Pertinent Vitals/Pain Pain Assessment: No/denies pain Pain Score: 7  Pain Location: R hip Pain Descriptors / Indicators: Discomfort Pain Intervention(s): Limited activity within patient's tolerance;Monitored during session;Repositioned;RN gave pain meds during session     Hand Dominance Right   Extremity/Trunk Assessment Upper Extremity Assessment Upper Extremity Assessment: Generalized weakness   Lower Extremity Assessment Lower Extremity Assessment: Generalized weakness       Communication Communication Communication: No difficulties   Cognition Arousal/Alertness: Awake/alert Behavior During Therapy: Anxious Overall Cognitive Status: Within Functional Limits for tasks assessed  Home Living Family/patient expects to be discharged to:: Private residence Living Arrangements: Spouse/significant other Available Help at Discharge: Family;Available PRN/intermittently Type of Home: House Home Access: Level entry     Home Layout: One level     Bathroom Shower/Tub: Teacher, early years/pre: Standard     Home Equipment: Environmental consultant - 2 wheels;Cane - single point    Additional Comments: Pt is uses 2L of O2 at night      Prior Functioning/Environment Level of Independence: Independent        Comments: reports no falls, household ambulator        OT Problem List: Decreased strength;Decreased activity tolerance;Decreased range of motion;Impaired balance (sitting and/or standing);Decreased safety awareness;Pain;Decreased cognition;Cardiopulmonary status limiting activity;Decreased knowledge of precautions;Decreased coordination;Decreased knowledge of use of DME or AE;Increased edema      OT Treatment/Interventions: Self-care/ADL training;Therapeutic exercise;Energy conservation;DME and/or AE instruction;Cognitive remediation/compensation;Therapeutic activities;Balance training;Patient/family education;Visual/perceptual remediation/compensation    OT Goals(Current goals can be found in the care plan section) Acute Rehab OT Goals Patient Stated Goal: to go home OT Goal Formulation: With patient Time For Goal Achievement: 10/12/20 Potential to Achieve Goals: Good ADL Goals Pt Will Perform Lower Body Dressing: with supervision;sit to/from stand Pt Will Transfer to Toilet: with supervision;ambulating Pt Will Perform Toileting - Clothing Manipulation and hygiene: with supervision;sit to/from stand  OT Frequency: Min 1X/week    AM-PAC OT "6 Clicks" Daily Activity     Outcome Measure Help from another person eating meals?: None Help from another person taking care of personal grooming?: A Little Help from another person toileting, which includes using toliet, bedpan, or urinal?: A Lot Help from another person bathing (including washing, rinsing, drying)?: A Lot Help from another person to put on and taking off regular upper body clothing?: A Little Help from another person to put on and taking off regular lower body clothing?: A Lot 6 Click Score: 16   End of Session Equipment Utilized During Treatment: Rolling walker;Oxygen Nurse Communication:  Mobility status  Activity Tolerance: Patient limited by fatigue Patient left: in chair;with chair alarm set;with nursing/sitter in room;with call bell/phone within reach  OT Visit Diagnosis: Unsteadiness on feet (R26.81);Muscle weakness (generalized) (M62.81)                Time: 7096-2836 OT Time Calculation (min): 40 min Charges:  OT General Charges $OT Visit: 1 Visit OT Evaluation $OT Eval Low Complexity: 1 Low OT Treatments $Therapeutic Activity: 23-37 mins  Darleen Crocker, MS, OTR/L , CBIS ascom [phone]  09/28/20, 4:15 PM

## 2020-09-28 NOTE — Progress Notes (Addendum)
PROGRESS NOTE    [name]  TIR:[id number] DOB: [birth year] DOA: 09/26/2020 PCP: [name], NP   Brief Narrative: Taken from H&P. [name] is a 85 y.o. female with medical history significant for pulmonary fibrosis on 2 L of oxygen at night, chronic low back pain, anxiety who presents to the ER via EMS for evaluation of right hip pain following a fall.  Patient had an appointment and was trying to get into her car when she tripped on ice and fell.  Imaging positive for right subcapital femoral neck fracture.  Orthopedic was consulted and patient was taken to the OR on 09/27/2020 for hemiarthroplasty.  Tolerated the procedure well. PT is recommending SNF placement.  Subjective: Patient was sitting in her bed and eating breakfast when seen today.  Continue to have some hip pain at surgical site but stating that it is bearable with pain medications.  Assessment & Plan:   Principal Problem:   Subcapital fracture of femur, right, closed, initial encounter ([place]) Active Problems:   Anxiety   HTN (hypertension)   Idiopathic pulmonary fibrosis ([place])  Subcapital right femoral neck fracture.  Secondary to mechanical fall. S/p hemiarthroplasty on 09/27/2020. -PT is recommending SNF placement. -Continue with pain management  Idiopathic pulmonary fibrosis.  Patient uses 2 L of oxygen at night. -Continue supplemental oxygen. -Continue home dose of pirfenidone  Anxiety/depression.  No acute concern. -Continue Celexa  C. difficile positive.  Patient was tested positive for C. difficile antigen and PCR with very mild to no oxygen SCD.  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 No leukocytosis or abdominal pain.  Patient is having multiple soft bowel movements, no watery diarrhea. -I will ask ID about isolation or any further treatment. -Continue with enteric precautions  Objective: Vitals:   09/27/20 2029 09/28/20  0243 09/28/20 0312 09/28/20 1428  BP: (!) 123/58  (!) 112/57 116/60  Pulse: 80  84 90  Resp: 20  16 20   Temp: 98.6 F (37 C)  98.6 F (37 C) (!) 97.5 F (36.4 C)  TempSrc: Oral  Oral Oral  SpO2: 99%  93% 90%  Weight:  65.4 kg    Height:        Intake/Output Summary (Last 24 hours) at 09/28/2020 1531 Last data filed at 09/28/2020 0950 Gross per 24 hour  Intake 1443.98 ml  Output 555 ml  Net 888.98 ml   Filed Weights   09/27/20 0300 09/27/20 1432 09/28/20 0243  Weight: 62.2 kg 62.2 kg 65.4 kg    Examination:  General.  Well-developed, pleasant elderly lady, in no acute distress. Pulmonary.  Lungs clear bilaterally, normal respiratory effort. CV.  Regular rate and rhythm, no JVD, rub or murmur. Abdomen.  Soft, nontender, nondistended, BS positive. CNS.  Alert and oriented x3.  No focal neurologic deficit. Extremities.  No edema, no cyanosis, pulses intact and symmetrical. Psychiatry.  Judgment and insight appears normal.  DVT prophylaxis: Lovenox Code Status: Full Family Communication: Discussed with patient Disposition Plan:  Status is: Inpatient  Remains inpatient appropriate because:Inpatient level of care appropriate due to severity of illness   Dispo: The patient is from: Home              Anticipated d/c is to: To be determined, PT recommending SNF, patient has not decided yet.              Anticipated d/c date is: 2 days  Patient currently is not medically stable to d/c.   Difficult to place patient No              Level of care: Med-Surg  Consultants:   Orthopedic surgery  Procedures:  Antimicrobials:   Data Reviewed: I have personally reviewed following labs and imaging studies  CBC: Recent Labs  Lab 09/26/20 1029 09/27/20 1834 09/28/20 0541  WBC 12.4* 13.6* 9.0  NEUTROABS 9.3*  --   --   HGB 13.5 12.9 11.4*  HCT 41.8 40.4 36.4  MCV 95.0 95.5 96.3  PLT 199 152 161*   Basic Metabolic Panel: Recent Labs  Lab 09/26/20 1029  09/27/20 1834 09/28/20 0541  NA 136  --  136  K 3.6  --  3.4*  CL 101  --  103  CO2 23  --  24  GLUCOSE 96  --  93  BUN 16  --  21  CREATININE 0.69 0.86 0.62  CALCIUM 8.5*  --  7.8*   GFR: Estimated Creatinine Clearance: 45.8 mL/min (by C-G formula based on SCr of 0.62 mg/dL). Liver Function Tests: Recent Labs  Lab 09/26/20 1029  AST 31  ALT 18  ALKPHOS 58  BILITOT 0.8  PROT 7.0  ALBUMIN 3.6   No results for input(s): LIPASE, AMYLASE in the last 168 hours. No results for input(s): AMMONIA in the last 168 hours. Coagulation Profile: No results for input(s): INR, PROTIME in the last 168 hours. Cardiac Enzymes: No results for input(s): CKTOTAL, CKMB, CKMBINDEX, TROPONINI in the last 168 hours. BNP (last 3 results) No results for input(s): PROBNP in the last 8760 hours. HbA1C: No results for input(s): HGBA1C in the last 72 hours. CBG: No results for input(s): GLUCAP in the last 168 hours. Lipid Profile: No results for input(s): CHOL, HDL, LDLCALC, TRIG, CHOLHDL, LDLDIRECT in the last 72 hours. Thyroid Function Tests: No results for input(s): TSH, T4TOTAL, FREET4, T3FREE, THYROIDAB in the last 72 hours. Anemia Panel: No results for input(s): VITAMINB12, FOLATE, FERRITIN, TIBC, IRON, RETICCTPCT in the last 72 hours. Sepsis Labs: No results for input(s): PROCALCITON, LATICACIDVEN in the last 168 hours.  Recent Results (from the past 240 hour(s))  SARS Coronavirus 2 by RT PCR (hospital order, performed in [facility] hospital lab) Nasopharyngeal Nasopharyngeal Swab     Status: None   Collection Time: 09/26/20 12:02 PM   Specimen: Nasopharyngeal Swab  Result Value Ref Range Status   SARS Coronavirus 2 NEGATIVE NEGATIVE Final    Comment: (NOTE) SARS-CoV-2 target nucleic acids are NOT DETECTED.  The SARS-CoV-2 RNA is generally detectable in upper and lower respiratory specimens during the acute phase of infection. The lowest concentration of SARS-CoV-2 viral copies this  assay can detect is 250 copies / mL. A negative result does not preclude SARS-CoV-2 infection and should not be used as the sole basis for treatment or other patient management decisions.  A negative result may occur with improper specimen collection / handling, submission of specimen other than nasopharyngeal swab, presence of viral mutation(s) within the areas targeted by this assay, and inadequate number of viral copies (<250 copies / mL). A negative result must be combined with clinical observations, patient history, and epidemiological information.  Fact Sheet for Patients:   StrictlyIdeas.no  Fact Sheet for Healthcare Providers: BankingDealers.co.za  This test is not yet approved or  cleared by the [place] FDA and has been authorized for detection and/or diagnosis of SARS-CoV-2 by FDA under an Emergency Use Authorization (EUA).  This  EUA will remain in effect (meaning this test can be used) for the duration of the COVID-19 declaration under Section 564(b)(1) of the Act, 21 U.S.C. section 360bbb-3(b)(1), unless the authorization is terminated or revoked sooner.  Performed at [facility], [place], [place] [address]   C Difficile Quick Screen w PCR reflex     Status: Abnormal   Collection Time: 09/27/20 10:35 AM   Specimen: STOOL  Result Value Ref Range Status   C Diff antigen POSITIVE (A) NEGATIVE Final   C Diff toxin NEGATIVE NEGATIVE Final   C Diff interpretation Results are indeterminate. See PCR results.  Final    Comment: Performed at [facility], [address] [place], [place] [address]  C. Diff by PCR, Reflexed     Status: Abnormal   Collection Time: 09/27/20 10:35 AM  Result Value Ref Range Status   Toxigenic C. Difficile by PCR POSITIVE (A) NEGATIVE Final    Comment: Positive for toxigenic C. difficile with little to no toxin production. Only treat if clinical presentation  suggests symptomatic illness. Performed at [facility], [address] [place], [place] [address]      Radiology Studies: DG HIP OPERATIVE [facility] OR W/O PELVIS RIGHT  Result Date: 09/27/2020 CLINICAL DATA:  Right hip operative EXAM: OPERATIVE right HIP (WITH PELVIS IF PERFORMED) 4 VIEWS TECHNIQUE: Fluoroscopic spot image(s) were submitted for interpretation post-operatively. COMPARISON:  09/26/2020 FINDINGS: Changes of right hip replacement. Normal AP alignment. No visible hardware complicating feature. IMPRESSION: Right hip replacement.  No visible complicating feature. Electronically Signed   By: Rolm Baptise M.D.   On: 09/27/2020 17:27    Scheduled Meds: . Chlorhexidine Gluconate Cloth  6 each Topical Daily  . citalopram  40 mg Oral Daily  . docusate sodium  100 mg Oral BID  . enoxaparin (LOVENOX) injection  40 mg Subcutaneous Q24H  . feeding supplement  237 mL Oral BID BM  . HYDROmorphone  4 mg Oral TID  . multivitamin with minerals  1 tablet Oral Daily  . nutrition supplement (JUVEN)  1 packet Oral BID BM  . pantoprazole  40 mg Oral Daily  . Pirfenidone  6 tablet Oral Daily  . senna  1 tablet Oral BID  . sulfaSALAzine  500 mg Oral BID   Continuous Infusions: . sodium chloride Stopped (09/28/20 1504)  . methocarbamol (ROBAXIN) IV       LOS: 2 days   Time spent: 30 minutes  Lorella Nimrod, MD [facility]  If 7PM-7AM, please contact night-coverage [email or web address]  09/28/2020, 3:31 PM   This record has been created using Systems analyst. Errors have been sought and corrected,but may not always be located. Such creation errors do not reflect on the standard of care.

## 2020-09-28 NOTE — Evaluation (Signed)
Physical Therapy Evaluation Patient Details Name: [name] MRN: [id number] DOB: [birth year] Today's Date: 09/28/2020   History of Present Illness  Pt admitted for subcapital fx of femur secondary to fall on ice and now s/p R ant hip hemi replacement on 1/26. History includes anxiety, HTN, pulm fibrosis (2L of O2 at night), and chronic LBP.  Clinical Impression  Pt is a pleasant 85 year old female who was admitted for subcapital fx s/p R hip hemiarthroplasty. Pt performs bed mobility with mod assist and needs +2 assist for hygiene and linen change due to uncontrolled diarrhea. Pt demonstrates deficits with strength/transfer/pain. Pt is currently not at baseline level. Would benefit from skilled PT to address above deficits and promote optimal return to PLOF; recommend transition to STR upon discharge from acute hospitalization.     Follow Up Recommendations SNF    Equipment Recommendations  None recommended by PT    Recommendations for Other Services       Precautions / Restrictions Precautions Precautions: Anterior Hip;Fall Precaution Booklet Issued: No Restrictions Weight Bearing Restrictions: Yes RLE Weight Bearing: Weight bearing as tolerated      Mobility  Bed Mobility Overal bed mobility: Needs Assistance Bed Mobility: Supine to Sit     Supine to sit: Mod assist     General bed mobility comments: needs assist for coming to EOB. Very anxious. Heavy cues for sequencing.    Transfers                 General transfer comment: upon attempting to stand, pt has massive diarrhea and needed to be returned to bed for clean up and hygiene. Further activity deferred at this time  Ambulation/Gait             General Gait Details: deferred  Stairs            Wheelchair Mobility    Modified Rankin (Stroke Patients Only)       Balance Overall balance assessment: Needs assistance Sitting-balance support: Bilateral upper extremity supported;Feet  supported Sitting balance-Leahy Scale: Fair                                       Pertinent Vitals/Pain Pain Assessment: 0-10 Pain Score: 9  Pain Location: R hip Pain Descriptors / Indicators: Discomfort Pain Intervention(s): Limited activity within patient's tolerance;Patient requesting pain meds-RN notified    Home Living Family/patient expects to be discharged to:: Private residence Living Arrangements: Spouse/significant other (husband and son) Available Help at Discharge: Family;Available PRN/intermittently Type of Home: House Home Access: Level entry     Home Layout: One level Home Equipment: Walker - 2 wheels;Cane - single point      Prior Function Level of Independence: Independent         Comments: reports no falls, household ambulator     Hand Dominance        Extremity/Trunk Assessment   Upper Extremity Assessment Upper Extremity Assessment: Generalized weakness (B UE grossly 4/5)    Lower Extremity Assessment Lower Extremity Assessment: Generalized weakness (R LE grossly 2/5; L LE grossly 3+/5)       Communication   Communication: No difficulties  Cognition Arousal/Alertness: Awake/alert Behavior During Therapy: Anxious Overall Cognitive Status: Within Functional Limits for tasks assessed  General Comments      Exercises Other Exercises Other Exercises: supine ther-ex performed on R LE including quad sets, hip abd/add, and LAQ. All ther-ex performed x 10 reps with slow technique with mod assist. Other Exercises: Pt returned to bed with CNA needing max assist of 2 for rolling/hygiene/linen change. Pt fatigues very easily   Assessment/Plan    PT Assessment Patient needs continued PT services  PT Problem List Decreased strength;Decreased activity tolerance;Decreased balance;Decreased mobility;Pain       PT Treatment Interventions DME instruction;Gait  training;Therapeutic activities;Therapeutic exercise;Balance training    PT Goals (Current goals can be found in the Care Plan section)  Acute Rehab PT Goals Patient Stated Goal: to go home PT Goal Formulation: With patient Time For Goal Achievement: 10/12/20 Potential to Achieve Goals: Good    Frequency BID   Barriers to discharge        Co-evaluation               AM-PAC PT "6 Clicks" Mobility  Outcome Measure Help needed turning from your back to your side while in a flat bed without using bedrails?: A Lot Help needed moving from lying on your back to sitting on the side of a flat bed without using bedrails?: A Lot Help needed moving to and from a bed to a chair (including a wheelchair)?: A Lot Help needed standing up from a chair using your arms (e.g., wheelchair or bedside chair)?: A Lot Help needed to walk in hospital room?: A Lot Help needed climbing 3-5 steps with a railing? : Total 6 Click Score: 11    End of Session Equipment Utilized During Treatment: Gait belt;Oxygen Activity Tolerance: Patient limited by pain Patient left: in bed;with bed alarm set;with SCD's reapplied Nurse Communication: Mobility status PT Visit Diagnosis: Muscle weakness (generalized) (M62.81);Difficulty in walking, not elsewhere classified (R26.2);History of falling (Z91.81);Pain Pain - Right/Left: Right Pain - part of body: Hip    Time: 4782-9562 PT Time Calculation (min) (ACUTE ONLY): 53 min   Charges:   PT Evaluation $PT Eval Low Complexity: 1 Low PT Treatments $Therapeutic Exercise: 8-22 mins $Therapeutic Activity: 23-37 mins        Elizabeth Palau, PT, DPT [phone]   Cashawn Yanko 09/28/2020, 12:53 PM

## 2020-09-28 NOTE — Progress Notes (Signed)
Subjective: 1 Day Post-Op Procedure(s) (LRB): ARTHROPLASTY BIPOLAR HIP (HEMIARTHROPLASTY) (Right) Patient reports pain as mild and moderate.   Patient is well, and has had no acute complaints or problems Denies any CP, SOB, ABD pain. We will start therapy today.   Objective: Vital signs in last 24 hours: Temp:  [97.1 F (36.2 C)-98.8 F (37.1 C)] 98.6 F (37 C) (01/27 0312) Pulse Rate:  [74-95] 84 (01/27 0312) Resp:  [13-20] 16 (01/27 0312) BP: (102-133)/(41-83) 112/57 (01/27 0312) SpO2:  [82 %-100 %] 93 % (01/27 0312) Weight:  [62.2 kg-65.4 kg] 65.4 kg (01/27 0243)  Intake/Output from previous day: 01/26 0701 - 01/27 0700 In: 1287 [I.V.:1120.6; IV Piggyback:83.4] Out: 555 [Urine:480; Blood:75] Intake/Output this shift: No intake/output data recorded.  Recent Labs    09/26/20 1029 09/27/20 1834 09/28/20 0541  HGB 13.5 12.9 11.4*   Recent Labs    09/27/20 1834 09/28/20 0541  WBC 13.6* 9.0  RBC 4.23 3.78*  HCT 40.4 36.4  PLT 152 135*   Recent Labs    09/26/20 1029 09/27/20 1834 09/28/20 0541  NA 136  --  136  K 3.6  --  3.4*  CL 101  --  103  CO2 23  --  24  BUN 16  --  21  CREATININE 0.69 0.86 0.62  GLUCOSE 96  --  93  CALCIUM 8.5*  --  7.8*   No results for input(s): LABPT, INR in the last 72 hours.  EXAM General - Patient is Alert, Appropriate and Oriented Extremity - Neurovascular intact Sensation intact distally Intact pulses distally Dorsiflexion/Plantar flexion intact No cellulitis present Compartment soft Dressing - dressing C/D/I and no drainage, prevena intact with out drainage Motor Function - intact, moving foot and toes well on exam.   Past Medical History:  Diagnosis Date  . Altered mental state   . Anxiety   . BMI 30.0-30.9,adult   . Cellulitis   . Chest pain, atypical   . Compression fracture of L4 lumbar vertebra   . Constipation   . Cystitis   . Cystocele   . Diverticulosis   . Fatigue   . Fibrosis, idiopathic  pulmonary ([place])   . Gastroenteritis   . History of back surgery   . History of herniated intervertebral disc   . HTN (hypertension)   . Hypercholesteremia   . Hypocalcemia   . Incontinence of urine   . OA (osteoarthritis)    shoulder and back  . Obesity   . Osteopenia   . Pneumonia   . Shortness of breath   . Sleep apnea   . Squamous cell carcinoma of skin 07/16/2016   Right upper lateral eyebrow. SCCis.  . Squamous cell carcinoma of skin 01/13/2018   Left temporal hairline. WD SCC with superficial infiltration.  Ritta Slot   . Vitamin D deficiency     Assessment/Plan:   1 Day Post-Op Procedure(s) (LRB): ARTHROPLASTY BIPOLAR HIP (HEMIARTHROPLASTY) (Right) Principal Problem:   Subcapital fracture of femur, right, closed, initial encounter ([place]) Active Problems:   Anxiety   HTN (hypertension)   Idiopathic pulmonary fibrosis ([place])  Estimated body mass index is 28.16 kg/m as calculated from the following:   Height as of this encounter: 5' (1.524 m).   Weight as of this encounter: 65.4 kg. Advance diet Up with therapy  Labs and VSS Pain controlled Hgb stable CM to assist with discharge  DVT Prophylaxis - Lovenox, Foot Pumps and TED hose Weight-Bearing as tolerated to right leg  Ronney Asters, PA-C [place] 09/28/2020, 8:19 AM

## 2020-09-29 ENCOUNTER — Encounter: Payer: Self-pay | Admitting: Internal Medicine

## 2020-09-29 DIAGNOSIS — I10 Essential (primary) hypertension: Secondary | ICD-10-CM

## 2020-09-29 DIAGNOSIS — J84112 Idiopathic pulmonary fibrosis: Secondary | ICD-10-CM

## 2020-09-29 DIAGNOSIS — S72011A Unspecified intracapsular fracture of right femur, initial encounter for closed fracture: Principal | ICD-10-CM

## 2020-09-29 LAB — BASIC METABOLIC PANEL
Anion gap: 7 (ref 5–15)
BUN: 29 mg/dL — ABNORMAL HIGH (ref 8–23)
CO2: 25 mmol/L (ref 22–32)
Calcium: 8.5 mg/dL — ABNORMAL LOW (ref 8.9–10.3)
Chloride: 106 mmol/L (ref 98–111)
Creatinine, Ser: 0.7 mg/dL (ref 0.44–1.00)
GFR, Estimated: >60 mL/min (ref >60)
Glucose, Bld: 109 mg/dL — ABNORMAL HIGH (ref 70–99)
Potassium: 4 mmol/L (ref 3.5–5.1)
Sodium: 138 mmol/L (ref 135–145)

## 2020-09-29 LAB — MAGNESIUM: Magnesium: 2.1 mg/dL (ref 1.7–2.4)

## 2020-09-29 LAB — CBC
HCT: 32 % — ABNORMAL LOW (ref 36.0–46.0)
Hemoglobin: 10.5 g/dL — ABNORMAL LOW (ref 12.0–15.0)
MCH: 31 pg (ref 26.0–34.0)
MCHC: 32.8 g/dL (ref 30.0–36.0)
MCV: 94.4 fL (ref 80.0–100.0)
Platelets: 135 10*3/uL — ABNORMAL LOW (ref 150–400)
RBC: 3.39 MIL/uL — ABNORMAL LOW (ref 3.87–5.11)
RDW: 13.6 % (ref 11.5–15.5)
WBC: 6.8 10*3/uL (ref 4.0–10.5)
nRBC: 0 % (ref 0.0–0.2)

## 2020-09-29 LAB — SURGICAL PATHOLOGY

## 2020-09-29 MED ORDER — ENOXAPARIN SODIUM 40 MG/0.4ML ~~LOC~~ SOLN: 40.0000 mg | SUBCUTANEOUS | 0 refills | Status: DC

## 2020-09-29 MED ORDER — FIDAXOMICIN 200 MG PO TABS
200.0000 mg | ORAL_TABLET | Freq: Two times a day (BID) | ORAL | Status: DC
  Administered 2020-09-29 – 2020-10-02 (×7): 200 mg via ORAL
  Filled 2020-09-29 (×9): qty 1

## 2020-09-29 MED ORDER — FIDAXOMICIN 200 MG PO TABS: 200.0000 mg | ORAL_TABLET | Freq: Two times a day (BID) | ORAL | Status: DC

## 2020-09-29 MED ORDER — HYDROCODONE-ACETAMINOPHEN 5-325 MG PO TABS: 1.0000 | ORAL_TABLET | Freq: Four times a day (QID) | ORAL | 0 refills | Status: DC | PRN

## 2020-09-29 NOTE — Progress Notes (Signed)
Physical Therapy Treatment Patient Details Name: [name] MRN: [id number] DOB: 1936/05/12 Today's Date: 09/29/2020    History of Present Illness Pt admitted for subcapital fx of femur secondary to fall on ice and now s/p R ant hip hemi replacement on 1/26. History includes anxiety, HTN, pulm fibrosis (2L of O2 at night), and chronic LBP.    PT Comments    Pt was sitting in recliner eager to return to bed upon arriving. She reports fatigue and 4/10 pain in hip. Requested not to increased ambulation but did agree to getting back in bed to perform there ex handout. Pt required mod assist to stand from recliner to RW and min assist to take ~ 5 slow antalgic steps to EOB. Mod assist to achieve supine from EOB sitting. Pt performed HEP well. See exercises listed below. Acute PT continues to recommend DC to SNF at DC to address deficits while improving safety with ADL. Pt was in bed with bed alarm in place, call bell in reach, and pt resting comfortably.     Follow Up Recommendations  SNF     Equipment Recommendations  None recommended by PT    Recommendations for Other Services       Precautions / Restrictions Precautions Precautions: Anterior Hip;Fall Precaution Booklet Issued: Yes (comment) Restrictions Weight Bearing Restrictions: Yes RLE Weight Bearing: Weight bearing as tolerated    Mobility  Bed Mobility Overal bed mobility: Needs Assistance Bed Mobility: Sit to Supine     Supine to sit: Mod assist Sit to supine: Mod assist   General bed mobility comments: PT required mod assist to progress BLEs into bed from short sit EOB  Transfers Overall transfer level: Needs assistance Equipment used: Rolling walker (2 wheeled) Transfers: Sit to/from Stand Sit to Stand: Mod assist         General transfer comment: pt stood with mod assist form lower recliner surface height. needed vcs for proper handplacement and improved fwd wt  shift  Ambulation/Gait Ambulation/Gait assistance: Min assist Gait Distance (Feet): 5 Feet Assistive device: Rolling walker (2 wheeled) Gait Pattern/deviations: Step-to pattern Gait velocity: decreased   General Gait Details: pt refused ambulation further distances 2/2 to fatigue and pain. was agreeable to performing ther ex once supine in bed.       Balance Overall balance assessment: Needs assistance Sitting-balance support: Bilateral upper extremity supported;Feet supported Sitting balance-Leahy Scale: Good Sitting balance - Comments: no LOB sitting EOB with feet support only   Standing balance support: During functional activity Standing balance-Leahy Scale: Fair Standing balance comment: reliant on UE support       Cognition Arousal/Alertness: Awake/alert Behavior During Therapy: WFL for tasks assessed/performed Overall Cognitive Status: Within Functional Limits for tasks assessed        General Comments: A and O x 4 and cooperative and pleasant      Exercises Total Joint Exercises Ankle Circles/Pumps: AROM;10 reps Quad Sets: AROM;10 reps Gluteal Sets: AROM;10 reps Towel Squeeze: AROM;10 reps Heel Slides: AROM;10 reps (through minimal ROM) Hip ABduction/ADduction: AAROM;5 reps Straight Leg Raises: AROM;5 reps        Pertinent Vitals/Pain Pain Assessment: 0-10 Pain Score: 4  Faces Pain Scale: Hurts a little bit Pain Location: R hip Pain Descriptors / Indicators: Discomfort Pain Intervention(s): Limited activity within patient's tolerance;Monitored during session;Repositioned           PT Goals (current goals can now be found in the care plan section) Acute Rehab PT Goals Patient Stated Goal: to go home  after rehab Progress towards PT goals: Progressing toward goals    Frequency    BID      PT Plan Current plan remains appropriate       AM-PAC PT "6 Clicks" Mobility   Outcome Measure  Help needed turning from your back to your side  while in a flat bed without using bedrails?: A Little Help needed moving from lying on your back to sitting on the side of a flat bed without using bedrails?: A Lot Help needed moving to and from a bed to a chair (including a wheelchair)?: A Lot Help needed standing up from a chair using your arms (e.g., wheelchair or bedside chair)?: A Lot Help needed to walk in hospital room?: A Lot Help needed climbing 3-5 steps with a railing? : A Lot 6 Click Score: 13    End of Session Equipment Utilized During Treatment: Gait belt;Oxygen (2 L) Activity Tolerance: Patient tolerated treatment well Patient left: in bed;with call bell/phone within reach;with bed alarm set Nurse Communication: Mobility status PT Visit Diagnosis: Muscle weakness (generalized) (M62.81);Difficulty in walking, not elsewhere classified (R26.2);History of falling (Z91.81);Pain Pain - Right/Left: Right Pain - part of body: Hip     Time: 1125-1150 PT Time Calculation (min) (ACUTE ONLY): 25 min  Charges:  $Gait Training: 8-22 mins $Therapeutic Exercise: 8-22 mins $Therapeutic Activity: 8-22 mins                     Julaine Fusi PTA 09/29/20, 2:22 PM

## 2020-09-29 NOTE — Progress Notes (Signed)
Progress Note    [name]  UUV:[id number] DOB: 1935/11/28  DOA: 09/26/2020 PCP: Doreene Nest, NP      Brief Narrative:    Medical records reviewed and are as summarized below:  [name] is a 85 y.o. female  with medical history significant for pulmonary fibrosis on 2 L of oxygen at night, chronic low back pain, anxiety who presents to the ER via EMS for evaluation of right hip pain following a fall.       Assessment/Plan:   Principal Problem:   Subcapital fracture of femur, right, closed, initial encounter (HCC) Active Problems:   Anxiety   HTN (hypertension)   Idiopathic pulmonary fibrosis (H[facility])   Nutrition Problem: Increased nutrient needs Etiology: post-op healing,hip fracture  Signs/Symptoms: estimated needs   Body mass index is 28.16 kg/m.    Subcapital right femoral neck fracture.  Secondary to mechanical fall. S/p hemiarthroplasty on 09/27/2020. Continue analgesics. Awaiting placement to SNF.  Idiopathic pulmonary fibrosis with chronic hypoxic respiratory failure.   Continue prednisone Continue 2 L/min oxygen via nasal cannula.   Anxiety/depression.  No acute concern. -Continue Celexa  C. difficile positive.   Patient was tested positive for C. difficile antigen.  Toxigenic C. difficile by PCR was positive and additional comments include "positive for toxigenic C. difficile with little to no toxin production".  She has history of C. difficile infection started in October 2021.  She said she took vancomycin for months.  She does not think she really got over it.  Start fidaxomicin and continue contact precautions.  Plan discussed with the patient and she is agreeable.    Diet Order            Diet 2 gram sodium Room service appropriate? Yes; Fluid consistency: Thin  Diet effective now                    Consultants:  Orthopedic surgeon, Dr. Rosita Kea  Procedures:  Right hip hemiarthroplasty on  09/27/2020    Medications:   . Chlorhexidine Gluconate Cloth  6 each Topical Daily  . citalopram  40 mg Oral Daily  . docusate sodium  100 mg Oral BID  . enoxaparin (LOVENOX) injection  40 mg Subcutaneous Q24H  . feeding supplement  237 mL Oral BID BM  . fidaxomicin  200 mg Oral BID  . HYDROmorphone  4 mg Oral TID  . multivitamin with minerals  1 tablet Oral Daily  . nutrition supplement (JUVEN)  1 packet Oral BID BM  . pantoprazole  40 mg Oral Daily  . Pirfenidone  6 tablet Oral Daily  . senna  1 tablet Oral BID  . sulfaSALAzine  500 mg Oral BID   Continuous Infusions: . sodium chloride Stopped (09/28/20 1504)  . methocarbamol (ROBAXIN) IV       Anti-infectives (From admission, onward)   Start     Dose/Rate Route Frequency Ordered Stop   09/29/20 1345  fidaxomicin (DIFICID) tablet 200 mg        200 mg Oral 2 times daily 09/29/20 1248     09/27/20 1930  ceFAZolin (ANCEF) IVPB 1 g/50 mL premix        1 g 100 mL/hr over 30 Minutes Intravenous Every 6 hours 09/27/20 1856 09/28/20 0309   09/27/20 1509  ceFAZolin (ANCEF) 1-4 GM/50ML-% IVPB       Note to Pharmacy: [name]  : cabinet override      09/27/20 1509 09/28/20 4742  09/27/20 1500  ceFAZolin (ANCEF) IVPB 1 g/50 mL premix  Status:  Discontinued        1 g 100 mL/hr over 30 Minutes Intravenous  Once 09/26/20 1302 09/28/20 0812   09/26/20 1300  vancomycin (VANCOCIN) 125 MG capsule 125 mg  Status:  Discontinued       Note to Pharmacy: 1 tab daily for 7 days then decrease to 1 tab every other day for 7 days     125 mg Oral Every other day 09/26/20 1251 09/26/20 1425             Family Communication/Anticipated D/C date and plan/Code Status   DVT prophylaxis: enoxaparin (LOVENOX) injection 40 mg Start: 09/28/20 1000 SCDs Start: 09/27/20 1827 Place TED hose Start: 09/27/20 1827 SCDs Start: 09/26/20 1250     Code Status: Full Code  Family Communication: None Disposition Plan:    Status is:  Inpatient  Remains inpatient appropriate because:Unsafe d/c plan   Dispo: The patient is from: Home              Anticipated d/c is to: SNF              Anticipated d/c date is: 1 day              Patient currently is not medically stable to d/c.   Difficult to place patient No           Subjective:   C/o watery diarrhea.  She said she had 3 watery stools this morning.  She had C. difficile infection in October 2021 and she said she took vancomycin for months.  She said her diarrhea improved a little bit prior to admission but did worsened after she was hospitalized.  She has pain in the right hip.  Objective:    Vitals:   09/28/20 2224 09/29/20 0045 09/29/20 0433 09/29/20 0824  BP: (!) 94/51 102/88 (!) 102/55 (!) 98/55  Pulse: 98 94 80 71  Resp: 16 16 16 17   Temp: 98.4 F (36.9 C) 98.2 F (36.8 C) 98.4 F (36.9 C) 97.6 F (36.4 C)  TempSrc: Oral     SpO2: 90% 92% 96% 98%  Weight:      Height:       No data found.  No intake or output data in the 24 hours ending 09/29/20 1248 Filed Weights   09/27/20 0300 09/27/20 1432 09/28/20 0243  Weight: 62.2 kg 62.2 kg 65.4 kg    Exam:  GEN: NAD SKIN: No rash EYES: EOMI ENT: MMM CV: RRR PULM: CTA B ABD: soft, ND, NT, +BS CNS: AAO x 3, non focal EXT: Right hip tenderness.  Right hip wound connected to wound vac.   Data Reviewed:   I have personally reviewed following labs and imaging studies:  Labs: Labs show the following:   Basic Metabolic Panel: Recent Labs  Lab 09/26/20 1029 09/27/20 1834 09/28/20 0541 09/29/20 0351  NA 136  --  136 138  K 3.6  --  3.4* 4.0  CL 101  --  103 106  CO2 23  --  24 25  GLUCOSE 96  --  93 109*  BUN 16  --  21 29*  CREATININE 0.69 0.86 0.62 0.70  CALCIUM 8.5*  --  7.8* 8.5*  MG  --   --   --  2.1   GFR Estimated Creatinine Clearance: 45.8 mL/min (by C-G formula based on SCr of 0.7 mg/dL). Liver Function Tests: Recent Labs  Lab  09/26/20 1029  AST 31  ALT 18   ALKPHOS 58  BILITOT 0.8  PROT 7.0  ALBUMIN 3.6   No results for input(s): LIPASE, AMYLASE in the last 168 hours. No results for input(s): AMMONIA in the last 168 hours. Coagulation profile No results for input(s): INR, PROTIME in the last 168 hours.  CBC: Recent Labs  Lab 09/26/20 1029 09/27/20 1834 09/28/20 0541 09/29/20 0351  WBC 12.4* 13.6* 9.0 6.8  NEUTROABS 9.3*  --   --   --   HGB 13.5 12.9 11.4* 10.5*  HCT 41.8 40.4 36.4 32.0*  MCV 95.0 95.5 96.3 94.4  PLT 199 152 135* 135*   Cardiac Enzymes: No results for input(s): CKTOTAL, CKMB, CKMBINDEX, TROPONINI in the last 168 hours. BNP (last 3 results) No results for input(s): PROBNP in the last 8760 hours. CBG: No results for input(s): GLUCAP in the last 168 hours. D-Dimer: No results for input(s): DDIMER in the last 72 hours. Hgb A1c: No results for input(s): HGBA1C in the last 72 hours. Lipid Profile: No results for input(s): CHOL, HDL, LDLCALC, TRIG, CHOLHDL, LDLDIRECT in the last 72 hours. Thyroid function studies: No results for input(s): TSH, T4TOTAL, T3FREE, THYROIDAB in the last 72 hours.  Invalid input(s): FREET3 Anemia work up: No results for input(s): VITAMINB12, FOLATE, FERRITIN, TIBC, IRON, RETICCTPCT in the last 72 hours. Sepsis Labs: Recent Labs  Lab 09/26/20 1029 09/27/20 1834 09/28/20 0541 09/29/20 0351  WBC 12.4* 13.6* 9.0 6.8    Microbiology Recent Results (from the past 240 hour(s))  SARS Coronavirus 2 by RT PCR (hospital order, performed in [facility] hospital lab) Nasopharyngeal Nasopharyngeal Swab     Status: None   Collection Time: 09/26/20 12:02 PM   Specimen: Nasopharyngeal Swab  Result Value Ref Range Status   SARS Coronavirus 2 NEGATIVE NEGATIVE Final    Comment: (NOTE) SARS-CoV-2 target nucleic acids are NOT DETECTED.  The SARS-CoV-2 RNA is generally detectable in upper and lower respiratory specimens during the acute phase of infection. The lowest concentration of  SARS-CoV-2 viral copies this assay can detect is 250 copies / mL. A negative result does not preclude SARS-CoV-2 infection and should not be used as the sole basis for treatment or other patient management decisions.  A negative result may occur with improper specimen collection / handling, submission of specimen other than nasopharyngeal swab, presence of viral mutation(s) within the areas targeted by this assay, and inadequate number of viral copies (<250 copies / mL). A negative result must be combined with clinical observations, patient history, and epidemiological information.  Fact Sheet for Patients:   BoilerBrush.com.cy  Fact Sheet for Healthcare Providers: https://pope.com/  This test is not yet approved or  cleared by the [place] FDA and has been authorized for detection and/or diagnosis of SARS-CoV-2 by FDA under an Emergency Use Authorization (EUA).  This EUA will remain in effect (meaning this test can be used) for the duration of the COVID-19 declaration under Section 564(b)(1) of the Act, 21 U.S.C. section 360bbb-3(b)(1), unless the authorization is terminated or revoked sooner.  Performed at [facility], [address] [place], [place] [address]   C Difficile Quick Screen w PCR reflex     Status: Abnormal   Collection Time: 09/27/20 10:35 AM   Specimen: STOOL  Result Value Ref Range Status   C Diff antigen POSITIVE (A) NEGATIVE Final   C Diff toxin NEGATIVE NEGATIVE Final   C Diff interpretation Results are indeterminate. See PCR results.  Final    Comment: Performed at [facility], [address] [place], [place] [address]  C. Diff by PCR, Reflexed     Status: Abnormal   Collection Time: 09/27/20 10:35 AM  Result Value Ref Range Status   Toxigenic C. Difficile by PCR POSITIVE (A) NEGATIVE Final    Comment: Positive for toxigenic C. difficile with little to no toxin production. Only  treat if clinical presentation suggests symptomatic illness. Performed at [facility], [address] [place], [place] [address]     Procedures and diagnostic studies:  DG HIP OPERATIVE UNILAT W OR W/O PELVIS RIGHT  Result Date: 09/27/2020 CLINICAL DATA:  Right hip operative EXAM: OPERATIVE right HIP (WITH PELVIS IF PERFORMED) 4 VIEWS TECHNIQUE: Fluoroscopic spot image(s) were submitted for interpretation post-operatively. COMPARISON:  09/26/2020 FINDINGS: Changes of right hip replacement. Normal AP alignment. No visible hardware complicating feature. IMPRESSION: Right hip replacement.  No visible complicating feature. Electronically Signed   By: Charlett Nose M.D.   On: 09/27/2020 17:27               LOS: 3 days   Jacquan Savas  [facility]   Pager on [email or web address]. If 7PM-7AM, please contact night-coverage at [email or web address]     09/29/2020, 12:48 PM

## 2020-09-29 NOTE — TOC Initial Note (Signed)
Transition of Care [facility]) - Initial/Assessment Note    Patient Details  Name: [name] MRN: [id number] Date of Birth: August 11, 1936  Transition of Care [facility]) CM/SW Contact:    Magnus Ivan, LCSW Phone Number: 09/29/2020, 2:05 PM  Clinical Narrative:                CSW spoke with patient. Patient lives with her husband and son. Husband provides transportation. PCP is Loma Boston, NP. Pharmacy is [facility]. Patient has a RW at home. Patient had [facility] in the past, could not recall which agency. No SNF history. Patient has had her COVID vaccines. Patient agreeable to PT recommendation for SNF. Prefers SNF close to home. CSW starting SNF work up.    Expected Discharge Plan: Skilled Nursing Facility Barriers to Discharge: Continued Medical Work up   Patient Goals and CMS Choice Patient states their goals for this hospitalization and ongoing recovery are:: SNF rehab CMS Medicare.gov Compare Post Acute Care list provided to:: Patient Choice offered to / list presented to : Patient  Expected Discharge Plan and Services Expected Discharge Plan: [place]       Living arrangements for the past 2 months: Single Family Home                                      Prior Living Arrangements/Services Living arrangements for the past 2 months: Single Family Home Lives with:: Spouse,Adult Children Patient language and need for interpreter reviewed:: Yes Do you feel safe going back to the place where you live?: Yes      Need for Family Participation in Patient Care: Yes (Comment) Care giver support system in place?: Yes (comment) Current home services: DME Criminal Activity/Legal Involvement Pertinent to Current Situation/Hospitalization: No - Comment as needed  Activities of Daily Living Home Assistive Devices/Equipment: None ADL Screening (condition at time of admission) Patient's cognitive ability adequate to safely complete daily activities?:  Yes Is the patient deaf or have difficulty hearing?: No Does the patient have difficulty seeing, even when wearing glasses/contacts?: No Does the patient have difficulty concentrating, remembering, or making decisions?: No Patient able to express need for assistance with ADLs?: Yes Does the patient have difficulty dressing or bathing?: No Independently performs ADLs?: Yes (appropriate for developmental age) Does the patient have difficulty walking or climbing stairs?: No Weakness of Legs: None Weakness of Arms/Hands: None  Permission Sought/Granted Permission sought to share information with : Chartered certified accountant granted to share information with : Yes, Verbal Permission Granted     Permission granted to share info w AGENCY: SNFs        Emotional Assessment       Orientation: : Oriented to Self,Oriented to Place,Oriented to  Time,Oriented to Situation Alcohol / Substance Use: Not Applicable Psych Involvement: No (comment)  Admission diagnosis:  Closed subcapital fracture of femur, right, initial encounter ([place]) [S72.011A] Subcapital fracture of femur, right, closed, initial encounter ([place]) [S72.011A] Patient Active Problem List   Diagnosis Date Noted  . Subcapital fracture of femur, right, closed, initial encounter ([place]) 09/26/2020  . Viral URI with cough 08/10/2020  . COVID-19 virus infection 05/28/2020  . Vitamin B 12 deficiency 03/15/2020  . Sensation of fullness in both ears 08/23/2019  . Osteopenia 05/27/2019  . Female cystocele 05/27/2019  . Urinary incontinence 05/27/2019  . Rectal bleeding 10/06/2018  . Fatigue 09/29/2018  . Vitamin D  deficiency 03/23/2018  . 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 Tinea corporis 04/26/2016  . Family history of colon cancer requiring screening colonoscopy 04/26/2016  . Slow transit constipation 04/26/2016  . Candidiasis 01/17/2016  . Hypercholesteremia 04/24/2015  . Mild cognitive impairment 04/24/2015  .  Compression fracture of L4 lumbar vertebra 04/24/2015  . Idiopathic pulmonary fibrosis ([place]) 04/24/2015  . Vaginal pessary in situ 04/19/2015  . Cystocele with uterine descensus 03/30/2015  . Anxiety 02/02/2015  . Arthritis 02/02/2015  . HTN (hypertension) 02/02/2015  . Sleep apnea 02/02/2015  . 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

## 2020-09-29 NOTE — Discharge Instructions (Signed)
ANTERIOR APPROACH HIP REPLACEMENT POSTOPERATIVE DIRECTIONS   Hip Rehabilitation, Guidelines Following Surgery  The results of a hip operation are greatly improved after range of motion and muscle strengthening exercises. Follow all safety measures which are given to protect your hip. If any of these exercises cause increased pain or swelling in your joint, decrease the amount until you are comfortable again. Then slowly increase the exercises. Call your caregiver if you have problems or questions.   HOME CARE INSTRUCTIONS  Remove items at home which could result in a fall. This includes throw rugs or furniture in walking pathways.   ICE to the affected hip every three hours for 30 minutes at a time and then as needed for pain and swelling.  Continue to use ice on the hip for pain and swelling from surgery. You may notice swelling that will progress down to the foot and ankle.  This is normal after surgery.  Elevate the leg when you are not up walking on it.    Continue to use the breathing machine which will help keep your temperature down.  It is common for your temperature to cycle up and down following surgery, especially at night when you are not up moving around and exerting yourself.  The breathing machine keeps your lungs expanded and your temperature down.  Do not place pillow under knee, focus on keeping the knee straight while resting  DIET You may resume your previous home diet once your are discharged from the hospital.  DRESSING / WOUND CARE / SHOWERING Please remove provena negative pressure dressing on 10/07/2020 and apply honey comb dressing. Keep dressing clean and dry at all times.   ACTIVITY Walk with your walker as instructed. Use walker as long as suggested by your caregivers. Avoid periods of inactivity such as sitting longer than an hour when not asleep. This helps prevent blood clots.  You may resume a sexual relationship in one month or when given the OK by your  doctor.  You may return to work once you are cleared by your doctor.  Do not drive a car for 6 weeks or until released by you surgeon.  Do not drive while taking narcotics.  WEIGHT BEARING Weight bearing as tolerated. Use walker/cane as needed for at least 4 weeks post op.  POSTOPERATIVE CONSTIPATION PROTOCOL Constipation - defined medically as fewer than three stools per week and severe constipation as less than one stool per week.  One of the most common issues patients have following surgery is constipation.  Even if you have a regular bowel pattern at home, your normal regimen is likely to be disrupted due to multiple reasons following surgery.  Combination of anesthesia, postoperative narcotics, change in appetite and fluid intake all can affect your bowels.  In order to avoid complications following surgery, here are some recommendations in order to help you during your recovery period.  Colace (docusate) - Pick up an over-the-counter form of Colace or another stool softener and take twice a day as long as you are requiring postoperative pain medications.  Take with a full glass of water daily.  If you experience loose stools or diarrhea, hold the colace until you stool forms back up.  If your symptoms do not get better within 1 week or if they get worse, check with your doctor.  Dulcolax (bisacodyl) - Pick up over-the-counter and take as directed by the product packaging as needed to assist with the movement of your bowels.  Take with a full  glass of water.  Use this product as needed if not relieved by Colace only.   MiraLax (polyethylene glycol) - Pick up over-the-counter to have on hand.  MiraLax is a solution that will increase the amount of water in your bowels to assist with bowel movements.  Take as directed and can mix with a glass of water, juice, soda, coffee, or tea.  Take if you go more than two days without a movement. Do not use MiraLax more than once per day. Call your doctor  if you are still constipated or irregular after using this medication for 7 days in a row.  If you continue to have problems with postoperative constipation, please contact the office for further assistance and recommendations.  If you experience "the worst abdominal pain ever" or develop nausea or vomiting, please contact the office immediatly for further recommendations for treatment.  ITCHING  If you experience itching with your medications, try taking only a single pain pill, or even half a pain pill at a time.  You can also use Benadryl over the counter for itching or also to help with sleep.   TED HOSE STOCKINGS Wear the elastic stockings on both legs for six weeks following surgery during the day but you may remove then at night for sleeping.  MEDICATIONS See your medication summary on the After Visit Summary that the nursing staff will review with you prior to discharge.  You may have some home medications which will be placed on hold until you complete the course of blood thinner medication.  It is important for you to complete the blood thinner medication as prescribed by your surgeon.  Continue your approved medications as instructed at time of discharge.  PRECAUTIONS If you experience chest pain or shortness of breath - call 911 immediately for transfer to the hospital emergency department.  If you develop a fever greater that 101 F, purulent drainage from wound, increased redness or drainage from wound, foul odor from the wound/dressing, or calf pain - CONTACT YOUR SURGEON.                                                   FOLLOW-UP APPOINTMENTS Make sure you keep all of your appointments after your operation with your surgeon and caregivers. You should call the office at the above phone number and make an appointment for approximately two weeks after the date of your surgery or on the date instructed by your surgeon outlined in the "After Visit Summary".  RANGE OF MOTION AND  STRENGTHENING EXERCISES  These exercises are designed to help you keep full movement of your hip joint. Follow your caregiver's or physical therapist's instructions. Perform all exercises about fifteen times, three times per day or as directed. Exercise both hips, even if you have had only one joint replacement. These exercises can be done on a training (exercise) mat, on the floor, on a table or on a bed. Use whatever works the best and is most comfortable for you. Use music or television while you are exercising so that the exercises are a pleasant break in your day. This will make your life better with the exercises acting as a break in routine you can look forward to.  Lying on your back, slowly slide your foot toward your buttocks, raising your knee up off the floor. Then slowly  slide your foot back down until your leg is straight again.  Lying on your back spread your legs as far apart as you can without causing discomfort.  Lying on your side, raise your upper leg and foot straight up from the floor as far as is comfortable. Slowly lower the leg and repeat.  Lying on your back, tighten up the muscle in the front of your thigh (quadriceps muscles). You can do this by keeping your leg straight and trying to raise your heel off the floor. This helps strengthen the largest muscle supporting your knee.  Lying on your back, tighten up the muscles of your buttocks both with the legs straight and with the knee bent at a comfortable angle while keeping your heel on the floor.   IF YOU ARE TRANSFERRED TO A SKILLED REHAB FACILITY If the patient is transferred to a skilled rehab facility following release from the hospital, a list of the current medications will be sent to the facility for the patient to continue.  When discharged from the skilled rehab facility, please have the facility set up the patient's [place] prior to being released. Also, the skilled facility will be responsible for  providing the patient with their medications at time of release from the facility to include their pain medication, the muscle relaxants, and their blood thinner medication. If the patient is still at the rehab facility at time of the two week follow up appointment, the skilled rehab facility will also need to assist the patient in arranging follow up appointment in our office and any transportation needs.  MAKE SURE YOU:  Understand these instructions.  Get help right away if you are not doing well or get worse.    Pick up stool softner and laxative for home use following surgery while on pain medications. Continue to use ice for pain and swelling after surgery. Do not use any lotions or creams on the incision until instructed by your surgeon.

## 2020-09-29 NOTE — Progress Notes (Signed)
RE: [name] Date of Birth: 01-12-2036 Date: 09/29/20   To Whom It May Concern:  Please be advised that the above-named patient will require a short-term nursing home stay - anticipated 30 days or less for rehabilitation and strengthening.  The plan is for return home.

## 2020-09-29 NOTE — NC FL2 (Signed)
[facility] LEVEL OF CARE SCREENING TOOL     IDENTIFICATION  Patient Name: [name] Birthdate: 08/20/1936 Sex: female Admission Date (Current Location): 09/26/2020  [place] and [place] Number:  Engineering geologist and Address:  [facility], [address] [place], [place] [address]      Provider Number: [id number]  Attending Physician Name and Address:  Jennye Boroughs, MD  Relative Name and Phone Number:  [place], Paradiso (Spouse)   [phone] [facility])    Current Level of Care: Hospital Recommended Level of Care: [place] Prior Approval Number:    Date Approved/Denied:   PASRR Number: pending  Discharge Plan: SNF    Current Diagnoses: Patient Active Problem List   Diagnosis Date Noted  . Subcapital fracture of femur, right, closed, initial encounter ([place]) 09/26/2020  . Viral URI with cough 08/10/2020  . COVID-19 virus infection 05/28/2020  . Vitamin B 12 deficiency 03/15/2020  . Sensation of fullness in both ears 08/23/2019  . Osteopenia 05/27/2019  . Female cystocele 05/27/2019  . Urinary incontinence 05/27/2019  . Rectal bleeding 10/06/2018  . Fatigue 09/29/2018  . Vitamin D deficiency 03/23/2018  . 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 Tinea corporis 04/26/2016  . Family history of colon cancer requiring screening colonoscopy 04/26/2016  . Slow transit constipation 04/26/2016  . Candidiasis 01/17/2016  . Hypercholesteremia 04/24/2015  . Mild cognitive impairment 04/24/2015  . Compression fracture of L4 lumbar vertebra 04/24/2015  . Idiopathic pulmonary fibrosis ([place]) 04/24/2015  . Vaginal pessary in situ 04/19/2015  . Cystocele with uterine descensus 03/30/2015  . Anxiety 02/02/2015  . Arthritis 02/02/2015  . HTN (hypertension) 02/02/2015  . Sleep apnea 02/02/2015  . 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    Orientation RESPIRATION BLADDER Height & Weight      Self,Situation,Time,Place  O2 Continent Weight: 144 lb 2.9 oz (65.4 kg) Height:  5' (152.4 cm)  BEHAVIORAL SYMPTOMS/MOOD NEUROLOGICAL BOWEL NUTRITION STATUS      Continent Diet (2 gram sodium diet)  AMBULATORY STATUS COMMUNICATION OF NEEDS Skin   Limited Assist Verbally Wound Vac,Surgical wounds (R hip)                       Personal Care Assistance Level of Assistance  Bathing,Feeding,Dressing Bathing Assistance: Limited assistance Feeding assistance: Independent Dressing Assistance: Limited assistance     Functional Limitations Info             SPECIAL CARE FACTORS FREQUENCY  PT (By licensed PT),OT (By licensed OT)     PT Frequency: 5 x/week OT Frequency: 5 x/week            Contractures      Additional Factors Info  Code Status,Allergies Code Status Info: full Allergies Info: nka           Current Medications (09/29/2020):  This is the current hospital active medication list Current Facility-Administered Medications  Medication Dose Route Frequency Provider Last Rate Last Admin  . 0.9 %  sodium chloride infusion   Intravenous Continuous Hessie Knows, MD   Paused at 09/28/20 1504  . alum & mag hydroxide-simeth (MAALOX/MYLANTA) 200-200-20 MG/5ML suspension 30 mL  30 mL Oral Q4H PRN Hessie Knows, MD      . bisacodyl (DULCOLAX) suppository 10 mg  10 mg Rectal Daily PRN Hessie Knows, MD      . Chlorhexidine Gluconate Cloth 2 % PADS 6 each  6 each Topical Daily Lorella Nimrod, MD   6 each at 09/27/20  2041  . citalopram (CELEXA) tablet 40 mg  40 mg Oral Daily Hessie Knows, MD   40 mg at 09/29/20 1043  . dicyclomine (BENTYL) capsule 10 mg  10 mg Oral TID PRN Hessie Knows, MD      . diphenhydrAMINE (BENADRYL) 12.5 MG/5ML elixir 12.5-25 mg  12.5-25 mg Oral Q4H PRN Hessie Knows, MD      . docusate sodium (COLACE) capsule 100 mg  100 mg Oral BID Hessie Knows, MD      . enoxaparin (LOVENOX) injection 40 mg  40 mg Subcutaneous Q24H Hessie Knows, MD   40 mg at  09/29/20 1109  . feeding supplement (ENSURE ENLIVE / ENSURE PLUS) liquid 237 mL  237 mL Oral BID BM Hessie Knows, MD   237 mL at 09/29/20 1110  . fidaxomicin (DIFICID) tablet 200 mg  200 mg Oral BID Jennye Boroughs, MD      . HYDROcodone-acetaminophen (NORCO/VICODIN) 5-325 MG per tablet 1-2 tablet  1-2 tablet Oral Q6H PRN Hessie Knows, MD   2 tablet at 09/28/20 1411  . HYDROmorphone (DILAUDID) injection 0.5 mg  0.5 mg Intravenous Q2H PRN Hessie Knows, MD   0.5 mg at 09/26/20 2243  . HYDROmorphone (DILAUDID) injection 1 mg  1 mg Intravenous Q3H PRN Hessie Knows, MD   1 mg at 09/27/20 2354  . HYDROmorphone (DILAUDID) tablet 4 mg  4 mg Oral TID Hessie Knows, MD   4 mg at 09/29/20 1043  . magnesium citrate solution 1 Bottle  1 Bottle Oral Once PRN Hessie Knows, MD      . magnesium hydroxide (MILK OF MAGNESIA) suspension 30 mL  30 mL Oral Daily PRN Hessie Knows, MD      . menthol-cetylpyridinium (CEPACOL) lozenge 3 mg  1 lozenge Oral PRN Hessie Knows, MD       Or  . phenol (CHLORASEPTIC) mouth spray 1 spray  1 spray Mouth/Throat PRN Hessie Knows, MD      . methocarbamol (ROBAXIN) tablet 500 mg  500 mg Oral Q6H PRN Hessie Knows, MD       Or  . methocarbamol (ROBAXIN) 500 mg in dextrose 5 % 50 mL IVPB  500 mg Intravenous Q6H PRN Hessie Knows, MD      . metoCLOPramide (REGLAN) tablet 5-10 mg  5-10 mg Oral Q8H PRN Hessie Knows, MD       Or  . metoCLOPramide (REGLAN) injection 5-10 mg  5-10 mg Intravenous Q8H PRN Hessie Knows, MD      . multivitamin with minerals tablet 1 tablet  1 tablet Oral Daily Hessie Knows, MD   1 tablet at 09/29/20 1043  . nutrition supplement (JUVEN) (JUVEN) powder packet 1 packet  1 packet Oral BID BM Hessie Knows, MD   1 packet at 09/29/20 1110  . ondansetron (ZOFRAN) tablet 4 mg  4 mg Oral Q6H PRN Hessie Knows, MD       Or  . ondansetron [facility]) injection 4 mg  4 mg Intravenous Q6H PRN Hessie Knows, MD      . pantoprazole (PROTONIX) EC tablet 40 mg  40 mg  Oral Daily Hessie Knows, MD   40 mg at 09/29/20 1043  . Pirfenidone TABS 1,602 mg  6 tablet Oral Daily Hessie Knows, MD      . senna [facility]) tablet 8.6 mg  1 tablet Oral BID Hessie Knows, MD      . sulfaSALAzine (AZULFIDINE) EC tablet 500 mg  500 mg Oral BID Hessie Knows, MD   500 mg at  09/29/20 1043     Discharge Medications: Please see discharge summary for a list of discharge medications.  Relevant Imaging Results:  Relevant Lab Results:   Additional Information SS #: [id number]  [place], LCSW

## 2020-09-29 NOTE — Progress Notes (Signed)
Subjective: 2 Days Post-Op Procedure(s) (LRB): ARTHROPLASTY BIPOLAR HIP (HEMIARTHROPLASTY) (Right) Patient reports pain as mild.   Patient is well, and has had no acute complaints or problems Denies any CP, SOB, ABD pain. We will continue with therapy today.   Objective: Vital signs in last 24 hours: Temp:  [97.5 F (36.4 C)-98.4 F (36.9 C)] 98.4 F (36.9 C) (01/28 0433) Pulse Rate:  [80-98] 80 (01/28 0433) Resp:  [16-20] 16 (01/28 0433) BP: (94-116)/(51-88) 102/55 (01/28 0433) SpO2:  [90 %-96 %] 96 % (01/28 0433)  Intake/Output from previous day: 01/27 0701 - 01/28 0700 In: 240 [P.O.:240] Out: -  Intake/Output this shift: No intake/output data recorded.  Recent Labs    09/26/20 1029 09/27/20 1834 09/28/20 0541 09/29/20 0351  HGB 13.5 12.9 11.4* 10.5*   Recent Labs    09/28/20 0541 09/29/20 0351  WBC 9.0 6.8  RBC 3.78* 3.39*  HCT 36.4 32.0*  PLT 135* 135*   Recent Labs    09/28/20 0541 09/29/20 0351  NA 136 138  K 3.4* 4.0  CL 103 106  CO2 24 25  BUN 21 29*  CREATININE 0.62 0.70  GLUCOSE 93 109*  CALCIUM 7.8* 8.5*   No results for input(s): LABPT, INR in the last 72 hours.  EXAM General - Patient is Alert, Appropriate and Oriented Extremity - Neurovascular intact Sensation intact distally Intact pulses distally Dorsiflexion/Plantar flexion intact No cellulitis present Compartment soft Dressing - dressing C/D/I and no drainage, prevena intact with out drainage Motor Function - intact, moving foot and toes well on exam.   Past Medical History:  Diagnosis Date  . Altered mental state   . Anxiety   . BMI 30.0-30.9,adult   . Cellulitis   . Chest pain, atypical   . Compression fracture of L4 lumbar vertebra   . Constipation   . Cystitis   . Cystocele   . Diverticulosis   . Fatigue   . Fibrosis, idiopathic pulmonary ([place])   . Gastroenteritis   . History of back surgery   . History of herniated intervertebral disc   . HTN  (hypertension)   . Hypercholesteremia   . Hypocalcemia   . Incontinence of urine   . OA (osteoarthritis)    shoulder and back  . Obesity   . Osteopenia   . Pneumonia   . Shortness of breath   . Sleep apnea   . Squamous cell carcinoma of skin 07/16/2016   Right upper lateral eyebrow. SCCis.  . Squamous cell carcinoma of skin 01/13/2018   Left temporal hairline. WD SCC with superficial infiltration.  [place] Slot   . Vitamin D deficiency     Assessment/Plan:   2 Days Post-Op Procedure(s) (LRB): ARTHROPLASTY BIPOLAR HIP (HEMIARTHROPLASTY) (Right) Principal Problem:   Subcapital fracture of femur, right, closed, initial encounter ([place]) Active Problems:   Anxiety   HTN (hypertension)   Idiopathic pulmonary fibrosis ([place])  Estimated body mass index is 28.16 kg/m as calculated from the following:   Height as of this encounter: 5' (1.524 m).   Weight as of this encounter: 65.4 kg. Advance diet Up with therapy  Labs and VSS Pain controlled Hgb stable CM to assist with discharge to SNF pending insurance auth  Follow up with [place] ortho in 2 weeks Please remove provena negative pressure dressing on 10/07/20 and apply honey comb dressing. Keep dressing clean and dry at all times. TED hose BLE x 6 weeks   DVT Prophylaxis - Lovenox, Foot Pumps and TED hose  Weight-Bearing as tolerated to right leg   T. Rachelle Hora, PA-C [place] 09/29/2020, 8:00 AM

## 2020-09-29 NOTE — Progress Notes (Signed)
Physical Therapy Treatment Patient Details Name: [name] MRN: [id number] DOB: 03/07/1936 Today's Date: 09/29/2020    History of Present Illness Pt admitted for subcapital fx of femur secondary to fall on ice and now s/p R ant hip hemi replacement on 1/26. History includes anxiety, HTN, pulm fibrosis (2L of O2 at night), and chronic LBP.    PT Comments    Pt was long sitting in bed upon arriving. She states she had very loose BM earlier and reports she does not want to eat much because she is scared to have more loose stools. She is A and O x 4 and a good historian of past medical history. MD arrived during session and discussed pt's case and POC going forward. She was able to get OOB and ambulate increased distance this date. Does have slow step to antalgic gait but tolerated well. She fatigued quickly but was agreeable to sit in recliner at conclusion of session. Pt will greatly benefit from continued skilled PT at  SNF when DC'd to address deficits while improving safety with ADL.    Follow Up Recommendations  SNF     Equipment Recommendations  None recommended by PT    Recommendations for Other Services       Precautions / Restrictions Precautions Precautions: Anterior Hip;Fall Precaution Booklet Issued: Yes (comment) Restrictions Weight Bearing Restrictions: Yes RLE Weight Bearing: Weight bearing as tolerated    Mobility  Bed Mobility Overal bed mobility: Needs Assistance Bed Mobility: Supine to Sit     Supine to sit: Mod assist     General bed mobility comments: Pt required increased time and mod assist to exit bed with vcs throughout for technique and sequencing  Transfers Overall transfer level: Needs assistance Equipment used: Rolling walker (2 wheeled) Transfers: Sit to/from Stand Sit to Stand: Min assist;Mod assist         General transfer comment: Pt was able to stand from elevated bed height with min assist. required mod assist from standard  height surface  Ambulation/Gait Ambulation/Gait assistance: Min assist Gait Distance (Feet): 10 Feet Assistive device: Rolling walker (2 wheeled) Gait Pattern/deviations: Step-to pattern Gait velocity: decreased   General Gait Details: pt was able to ambulate 10 ft with slow step to gait pattern. required vcs for improved technique and gait kinematics      Balance Overall balance assessment: Needs assistance Sitting-balance support: Bilateral upper extremity supported;Feet supported Sitting balance-Leahy Scale: Good Sitting balance - Comments: no LOB sitting EOB with feet support only   Standing balance support: During functional activity Standing balance-Leahy Scale: Fair Standing balance comment: reliant on UE support           Cognition Arousal/Alertness: Awake/alert Behavior During Therapy: WFL for tasks assessed/performed Overall Cognitive Status: Within Functional Limits for tasks assessed      General Comments: Pt is A and O x 4. MD x 2 arrived during session. She is very agreeable to PT and cooperative throughout. agreeable to rehab at DC             Pertinent Vitals/Pain Pain Assessment: 0-10 Pain Score: 5  Faces Pain Scale: Hurts little more Pain Location: R hip Pain Descriptors / Indicators: Discomfort Pain Intervention(s): Limited activity within patient's tolerance;Monitored during session;Premedicated before session;Repositioned           PT Goals (current goals can now be found in the care plan section) Acute Rehab PT Goals Patient Stated Goal: to go home Progress towards PT goals: Progressing toward goals  Frequency    BID      PT Plan Current plan remains appropriate       AM-PAC PT "6 Clicks" Mobility   Outcome Measure  Help needed turning from your back to your side while in a flat bed without using bedrails?: A Little Help needed moving from lying on your back to sitting on the side of a flat bed without using bedrails?: A  Lot Help needed moving to and from a bed to a chair (including a wheelchair)?: A Lot Help needed standing up from a chair using your arms (e.g., wheelchair or bedside chair)?: A Lot Help needed to walk in hospital room?: A Lot Help needed climbing 3-5 steps with a railing? : A Lot 6 Click Score: 13    End of Session Equipment Utilized During Treatment: Gait belt;Oxygen Activity Tolerance: Patient tolerated treatment well Patient left: in chair;with chair alarm set;with call bell/phone within reach Nurse Communication: Mobility status PT Visit Diagnosis: Muscle weakness (generalized) (M62.81);Difficulty in walking, not elsewhere classified (R26.2);History of falling (Z91.81);Pain Pain - Right/Left: Right Pain - part of body: Hip     Time: 8832-5498 PT Time Calculation (min) (ACUTE ONLY): 34 min  Charges:  $Gait Training: 8-22 mins $Therapeutic Activity: 8-22 mins                     Julaine Fusi PTA 09/29/20, 2:06 PM

## 2020-09-30 DIAGNOSIS — A0472 Enterocolitis due to Clostridium difficile, not specified as recurrent: Secondary | ICD-10-CM | POA: Diagnosis present

## 2020-09-30 NOTE — Progress Notes (Signed)
Physical Therapy Treatment Patient Details Name: [name] MRN: [id number] DOB: 1936/03/08 Today's Date: 09/30/2020    History of Present Illness Pt admitted for subcapital fx of femur secondary to fall on ice and now s/p R ant hip hemi replacement on 1/26. History includes anxiety, HTN, pulm fibrosis (2L of O2 at night), and chronic LBP.    PT Comments    Returned for PM session. Pt agreeable and extremely pleasant. Attempted to wean pt from 2 L o2 however she did desaturate to 84%. Reapplied and quickly recovers to 90s. Pt does overall demonstrate improved mobility, transfers, and gait from AM session. Still required assistance and would benefit form SNF to improve safety with ADL. Acute PT will continue to follow and progress as able per POC.   Follow Up Recommendations  SNF     Equipment Recommendations  None recommended by PT    Recommendations for Other Services       Precautions / Restrictions Precautions Precautions: Anterior Hip;Fall Precaution Booklet Issued: Yes (comment) Restrictions Weight Bearing Restrictions: Yes RLE Weight Bearing: Weight bearing as tolerated    Mobility  Bed Mobility Overal bed mobility: Needs Assistance Bed Mobility: Sit to Supine     Supine to sit: Min assist     General bed mobility comments: Min assist progressing LEs into bed  Transfers Overall transfer level: Needs assistance Equipment used: Rolling walker (2 wheeled) Transfers: Sit to/from Stand Sit to Stand: Min guard         General transfer comment: CGA this afternoon to stand from recliner. pt demonstrated much improved technique and safety  Ambulation/Gait Ambulation/Gait assistance: Min guard Gait Distance (Feet): 30 Feet Assistive device: Rolling walker (2 wheeled) Gait Pattern/deviations: Step-through pattern;Trunk flexed Gait velocity: decreased   General Gait Details: pt tends to have shoulders tense. With vcs is able to relax. was easily able to  ambulate 30 ft around room but does endorse fatigue       Balance Overall balance assessment: Needs assistance Sitting-balance support: Bilateral upper extremity supported;Feet supported Sitting balance-Leahy Scale: Good Sitting balance - Comments: no LOB sitting EOB with feet support only   Standing balance support: Bilateral upper extremity supported;During functional activity Standing balance-Leahy Scale: Good         Cognition Arousal/Alertness: Awake/alert Behavior During Therapy: WFL for tasks assessed/performed Overall Cognitive Status: Within Functional Limits for tasks assessed        General Comments: A and O x 4 and cooperative and pleasant             Pertinent Vitals/Pain Pain Assessment: No/denies pain           PT Goals (current goals can now be found in the care plan section) Acute Rehab PT Goals Patient Stated Goal: to go home after rehab Progress towards PT goals: Progressing toward goals    Frequency    BID      PT Plan Current plan remains appropriate       AM-PAC PT "6 Clicks" Mobility   Outcome Measure  Help needed turning from your back to your side while in a flat bed without using bedrails?: A Little Help needed moving from lying on your back to sitting on the side of a flat bed without using bedrails?: A Little Help needed moving to and from a bed to a chair (including a wheelchair)?: A Little Help needed standing up from a chair using your arms (e.g., wheelchair or bedside chair)?: A Little Help needed to walk in  hospital room?: A Little Help needed climbing 3-5 steps with a railing? : A Little 6 Click Score: 18    End of Session Equipment Utilized During Treatment: Gait belt;Oxygen (2 L desat to 84% without O2) Activity Tolerance: Patient tolerated treatment well Patient left: in bed;with call bell/phone within reach;with bed alarm set Nurse Communication: Mobility status PT Visit Diagnosis: Muscle weakness (generalized)  (M62.81);Difficulty in walking, not elsewhere classified (R26.2);History of falling (Z91.81);Pain Pain - Right/Left: Right Pain - part of body: Hip     Time: 4650-3546 PT Time Calculation (min) (ACUTE ONLY): 19 min  Charges:  $Gait Training: 8-22 mins                     Julaine Fusi PTA 09/30/20, 4:52 PM

## 2020-09-30 NOTE — Progress Notes (Signed)
Subjective: 3 Days Post-Op Procedure(s) (LRB): ARTHROPLASTY BIPOLAR HIP (HEMIARTHROPLASTY) (Right) Patient reports pain as mild.   Patient is well, and has had no acute complaints or problems Denies any CP, SOB, ABD pain. We will continue with therapy today.  Plan is for discharge to SNF. Patient has tested positive for C. Diff has had several bowel movements.  Objective: Vital signs in last 24 hours: Temp:  [97.5 F (36.4 C)-98.6 F (37 C)] 97.9 F (36.6 C) (01/29 0814) Pulse Rate:  [68-97] 68 (01/29 0814) Resp:  [16-20] 17 (01/29 0814) BP: (102-126)/(64-81) 112/64 (01/29 0814) SpO2:  [93 %-97 %] 93 % (01/29 0814)  Intake/Output from previous day: No intake/output data recorded. Intake/Output this shift: No intake/output data recorded.  Recent Labs    09/27/20 1834 09/28/20 0541 09/29/20 0351  HGB 12.9 11.4* 10.5*   Recent Labs    09/28/20 0541 09/29/20 0351  WBC 9.0 6.8  RBC 3.78* 3.39*  HCT 36.4 32.0*  PLT 135* 135*   Recent Labs    09/28/20 0541 09/29/20 0351  NA 136 138  K 3.4* 4.0  CL 103 106  CO2 24 25  BUN 21 29*  CREATININE 0.62 0.70  GLUCOSE 93 109*  CALCIUM 7.8* 8.5*   No results for input(s): LABPT, INR in the last 72 hours.  EXAM General - Patient is Alert, Appropriate and Oriented Extremity - Neurovascular intact Sensation intact distally Intact pulses distally Dorsiflexion/Plantar flexion intact No cellulitis present Compartment soft Dressing - dressing C/D/I and no drainage, prevena intact with out drainage Motor Function - intact, moving foot and toes well on exam.   Past Medical History:  Diagnosis Date  . Altered mental state   . Anxiety   . BMI 30.0-30.9,adult   . Cellulitis   . Chest pain, atypical   . Compression fracture of L4 lumbar vertebra   . Constipation   . Cystitis   . Cystocele   . Diverticulosis   . Fatigue   . Fibrosis, idiopathic pulmonary ([place])   . Gastroenteritis   . History of back surgery   .  History of herniated intervertebral disc   . HTN (hypertension)   . Hypercholesteremia   . Hypocalcemia   . Incontinence of urine   . OA (osteoarthritis)    shoulder and back  . Obesity   . Osteopenia   . Pneumonia   . Shortness of breath   . Sleep apnea   . Squamous cell carcinoma of skin 07/16/2016   Right upper lateral eyebrow. SCCis.  . Squamous cell carcinoma of skin 01/13/2018   Left temporal hairline. WD SCC with superficial infiltration.  Ritta Slot   . Vitamin D deficiency     Assessment/Plan:   3 Days Post-Op Procedure(s) (LRB): ARTHROPLASTY BIPOLAR HIP (HEMIARTHROPLASTY) (Right) Principal Problem:   Subcapital fracture of femur, right, closed, initial encounter ([place]) Active Problems:   Anxiety   HTN (hypertension)   Idiopathic pulmonary fibrosis ([place])  Estimated body mass index is 28.16 kg/m as calculated from the following:   Height as of this encounter: 5' (1.524 m).   Weight as of this encounter: 65.4 kg. Advance diet Up with therapy   Labs and VSS, no recent fevers. Pain controlled Hgb stable CM to assist with discharge to SNF pending insurance auth Positive for C. Diff, continue Fidaxomicin at this time.  Follow up with [place] ortho in 2 weeks for staple removal. Please remove provena negative pressure dressing on 10/07/20 and apply honey comb dressing. Keep dressing  clean and dry at all times. TED hose BLE x 6 weeks  DVT Prophylaxis - Lovenox, Foot Pumps and TED hose Weight-Bearing as tolerated to right leg   J. Cameron Proud, PA-C [place] 09/30/2020, 8:32 AM

## 2020-09-30 NOTE — Progress Notes (Signed)
Physical Therapy Treatment Patient Details Name: [name] MRN: [id number] DOB: 03/24/1936 Today's Date: 09/30/2020    History of Present Illness Pt admitted for subcapital fx of femur secondary to fall on ice and now s/p R ant hip hemi replacement on 1/26. History includes anxiety, HTN, pulm fibrosis (2L of O2 at night), and chronic LBP.    PT Comments    Pt was long sitting in bed finishing breakfast upon arriving. She endorses 4-5/10 pain but is agreeable and pleasant throughout session. She is A and O x 4 and reports feeling overall,"pretty good." Pt is extremely pleasant and motivated. Required min assist to exit bed, stand,and ambulate on 2 L o2. Slow antalgic gait kinematics but no LOB or unsteadiness. Pt is on 2 L o2 at nighttime at baseline. She will benefit from youth RW in future sessions. Reviewed importance of continued performance of HEP. Pt requested not to sit up in recliner this date due to fatigue and wanting to rest. Will return later this date to continue to progress pt to PLOF. She will continue to benefit from skilled PT at DC to address deficits and improve independence with ADL. Recommend SNF. MD arrived during session and discuss POC/ medication regimen. Pt is in bed with call bell in reach and bed alarm in place.    Follow Up Recommendations  SNF     Equipment Recommendations  None recommended by PT    Recommendations for Other Services       Precautions / Restrictions Precautions Precautions: Anterior Hip;Fall Precaution Booklet Issued: Yes (comment) Restrictions Weight Bearing Restrictions: Yes RLE Weight Bearing: Weight bearing as tolerated    Mobility  Bed Mobility Overal bed mobility: Needs Assistance Bed Mobility: Sit to Supine;Supine to Sit     Supine to sit: Min assist Sit to supine: Min assist   General bed mobility comments: Min assist to exit and re-enter bed afterOOB activity  Transfers Overall transfer level: Needs  assistance Equipment used: Rolling walker (2 wheeled) Transfers: Sit to/from Stand Sit to Stand: Min assist         General transfer comment: Min assist to stand from EOB (lowest setting) with vcs for proper handplacement and technique  Ambulation/Gait Ambulation/Gait assistance: Min assist Gait Distance (Feet): 25 Feet Assistive device: Rolling walker (2 wheeled) Gait Pattern/deviations: Step-to pattern;Antalgic Gait velocity: decreased   General Gait Details: slow antalgic gait however no LOB or unsteadiness. Ambulated on 2 L o2 with sao2 > 90% throughout. pt on O2 at night at baseline       Balance Overall balance assessment: Needs assistance Sitting-balance support: Bilateral upper extremity supported;Feet supported Sitting balance-Leahy Scale: Good Sitting balance - Comments: no LOB sitting EOB with feet support only   Standing balance support: Bilateral upper extremity supported;During functional activity Standing balance-Leahy Scale: Good         Cognition Arousal/Alertness: Awake/alert Behavior During Therapy: WFL for tasks assessed/performed Overall Cognitive Status: Within Functional Limits for tasks assessed        General Comments: A and O x 4 and cooperative and pleasant             Pertinent Vitals/Pain Pain Assessment: 0-10 Pain Score: 4  Faces Pain Scale: Hurts a little bit Pain Location: R hip Pain Descriptors / Indicators: Discomfort Pain Intervention(s): Limited activity within patient's tolerance;Monitored during session;Premedicated before session;Repositioned           PT Goals (current goals can now be found in the care plan section) Acute Rehab  PT Goals Patient Stated Goal: to go home after rehab Progress towards PT goals: Progressing toward goals    Frequency    BID      PT Plan Current plan remains appropriate       AM-PAC PT "6 Clicks" Mobility   Outcome Measure  Help needed turning from your back to your side  while in a flat bed without using bedrails?: A Little Help needed moving from lying on your back to sitting on the side of a flat bed without using bedrails?: A Little Help needed moving to and from a bed to a chair (including a wheelchair)?: A Little Help needed standing up from a chair using your arms (e.g., wheelchair or bedside chair)?: A Little Help needed to walk in hospital room?: A Little Help needed climbing 3-5 steps with a railing? : A Lot 6 Click Score: 17    End of Session Equipment Utilized During Treatment: Gait belt;Oxygen (2 L throughout) Activity Tolerance: Patient tolerated treatment well Patient left: in bed;with call bell/phone within reach;with bed alarm set (Pt requested to return to bed at conclusion of session) Nurse Communication: Mobility status PT Visit Diagnosis: Muscle weakness (generalized) (M62.81);Difficulty in walking, not elsewhere classified (R26.2);History of falling (Z91.81);Pain Pain - Right/Left: Right Pain - part of body: Hip     Time: 0840-0906 PT Time Calculation (min) (ACUTE ONLY): 26 min  Charges:  $Gait Training: 8-22 mins $Therapeutic Activity: 8-22 mins                     Julaine Fusi PTA 09/30/20, 9:19 AM

## 2020-09-30 NOTE — Progress Notes (Addendum)
Progress Note    [name]  WJX:[id number] DOB: 01/17/1936  DOA: 09/26/2020 PCP: Doreene Nest, NP      Brief Narrative:    Medical records reviewed and are as summarized below:  [name] is a 85 y.o. female  with medical history significant for pulmonary fibrosis on 2 L of oxygen at night, chronic low back pain, anxiety who presents to the ER via EMS for evaluation of right hip pain following a fall.   She was found to have subcapital right femoral neck fracture.  She was treated with analgesics.  She was evaluated by orthopedic surgeon and she underwent right hemiarthroplasty on 09/27/2020.  She has a history of C. difficile colitis that required oral vancomycin for "months".  She said she never really got over the diarrhea.  She noticed worsening of diarrhea in the hospital.  Stool for C. difficile test showed toxigenic C. difficile by PCR.  She was started on fidaxomicin.  She was evaluated by PT and OT recommended further rehabilitation at a skilled nursing facility.    Assessment/Plan:   Principal Problem:   Subcapital fracture of femur, right, closed, initial encounter (HCC) Active Problems:   Anxiety   HTN (hypertension)   Idiopathic pulmonary fibrosis (HCC)   C. difficile diarrhea   Nutrition Problem: Increased nutrient needs Etiology: post-op healing,hip fracture  Signs/Symptoms: estimated needs   Body mass index is 28.16 kg/m.    Subcapital right femoral neck fracture.  Secondary to mechanical fall. S/p hemiarthroplasty on 09/27/2020. Continue analgesics. Awaiting placement to SNF.  Idiopathic pulmonary fibrosis with chronic hypoxic respiratory failure.   Continue prednisone Continue 2 L/min oxygen via nasal cannula.   Anxiety/depression.  No acute concern. -Continue Celexa  C. difficile positive.   Patient was tested positive for C. difficile antigen.  Toxigenic C. difficile by PCR was positive and additional comments  include "positive for toxigenic C. difficile with little to no toxin production".  She has history of C. difficile infection started in October 2021.  She said she took vancomycin for months.  She does not think she really got over it.  Continue fidaxomicin and enteric contact precautions.  She had as needed orders for laxatives but she has not been getting them because of diarrhea.  These orders have been discontinued.    Diet Order            Diet 2 gram sodium Room service appropriate? Yes; Fluid consistency: Thin  Diet effective now                    Consultants:  Orthopedic surgeon, Dr. Rosita Kea  Procedures:  Right hip hemiarthroplasty on 09/27/2020    Medications:   . Chlorhexidine Gluconate Cloth  6 each Topical Daily  . citalopram  40 mg Oral Daily  . enoxaparin (LOVENOX) injection  40 mg Subcutaneous Q24H  . feeding supplement  237 mL Oral BID BM  . fidaxomicin  200 mg Oral BID  . HYDROmorphone  4 mg Oral TID  . multivitamin with minerals  1 tablet Oral Daily  . nutrition supplement (JUVEN)  1 packet Oral BID BM  . pantoprazole  40 mg Oral Daily  . Pirfenidone  6 tablet Oral Daily  . sulfaSALAzine  500 mg Oral BID   Continuous Infusions: . sodium chloride Stopped (09/28/20 1504)  . methocarbamol (ROBAXIN) IV       Anti-infectives (From admission, onward)   Start     Dose/Rate  Route Frequency Ordered Stop   09/29/20 1400  fidaxomicin (DIFICID) tablet 200 mg        200 mg Oral 2 times daily 09/29/20 1307 10/09/20 0959   09/29/20 1345  fidaxomicin (DIFICID) tablet 200 mg  Status:  Discontinued        200 mg Oral 2 times daily 09/29/20 1248 09/29/20 1307   09/27/20 1930  ceFAZolin (ANCEF) IVPB 1 g/50 mL premix        1 g 100 mL/hr over 30 Minutes Intravenous Every 6 hours 09/27/20 1856 09/28/20 0309   09/27/20 1509  ceFAZolin (ANCEF) 1-4 GM/50ML-% IVPB       Note to Pharmacy: [name]  : cabinet override      09/27/20 1509 09/28/20 0314    09/27/20 1500  ceFAZolin (ANCEF) IVPB 1 g/50 mL premix  Status:  Discontinued        1 g 100 mL/hr over 30 Minutes Intravenous  Once 09/26/20 1302 09/28/20 0812   09/26/20 1300  vancomycin (VANCOCIN) 125 MG capsule 125 mg  Status:  Discontinued       Note to Pharmacy: 1 tab daily for 7 days then decrease to 1 tab every other day for 7 days     125 mg Oral Every other day 09/26/20 1251 09/26/20 1425             Family Communication/Anticipated D/C date and plan/Code Status   DVT prophylaxis: enoxaparin (LOVENOX) injection 40 mg Start: 09/28/20 1000 SCDs Start: 09/27/20 1827 Place TED hose Start: 09/27/20 1827     Code Status: Full Code  Family Communication: None Disposition Plan:    Status is: Inpatient  Remains inpatient appropriate because:Unsafe d/c plan   Dispo: The patient is from: Home              Anticipated d/c is to: SNF              Anticipated d/c date is: 1 day              Patient currently is not medically stable to d/c.   Difficult to place patient No           Subjective:   Interval events noted.  She still has watery diarrhea.  She had at least 2 watery stools last night and had one watery stools this morning.  No abdominal pain or vomiting. Harrold Donath, physical therapist, was at the bedside.   Objective:    Vitals:   09/29/20 2100 09/30/20 0137 09/30/20 0405 09/30/20 0814  BP: 110/68 115/76 102/81 112/64  Pulse: 87 73 97 68  Resp: 20 16 20 17   Temp: 98.6 F (37 C) 98.3 F (36.8 C) (!) 97.5 F (36.4 C) 97.9 F (36.6 C)  TempSrc: Oral Oral Oral   SpO2: 94% 95% 97% 93%  Weight:      Height:       No data found.  No intake or output data in the 24 hours ending 09/30/20 1111 Filed Weights   09/27/20 0300 09/27/20 1432 09/28/20 0243  Weight: 62.2 kg 62.2 kg 65.4 kg    Exam:   GEN: NAD SKIN: Warm and dry EYES: No pallor or icterus ENT: MMM CV: RRR PULM: CTA B ABD: soft, ND, NT, +BS CNS: AAO x 3, non focal EXT: Right  hip tenderness.  Right hip wound: Continue wound VAC    Data Reviewed:   I have personally reviewed following labs and imaging studies:  Labs: Labs show the following:  Basic Metabolic Panel: Recent Labs  Lab 09/26/20 1029 09/27/20 1834 09/28/20 0541 09/29/20 0351  NA 136  --  136 138  K 3.6  --  3.4* 4.0  CL 101  --  103 106  CO2 23  --  24 25  GLUCOSE 96  --  93 109*  BUN 16  --  21 29*  CREATININE 0.69 0.86 0.62 0.70  CALCIUM 8.5*  --  7.8* 8.5*  MG  --   --   --  2.1   GFR Estimated Creatinine Clearance: 45.8 mL/min (by C-G formula based on SCr of 0.7 mg/dL). Liver Function Tests: Recent Labs  Lab 09/26/20 1029  AST 31  ALT 18  ALKPHOS 58  BILITOT 0.8  PROT 7.0  ALBUMIN 3.6   No results for input(s): LIPASE, AMYLASE in the last 168 hours. No results for input(s): AMMONIA in the last 168 hours. Coagulation profile No results for input(s): INR, PROTIME in the last 168 hours.  CBC: Recent Labs  Lab 09/26/20 1029 09/27/20 1834 09/28/20 0541 09/29/20 0351  WBC 12.4* 13.6* 9.0 6.8  NEUTROABS 9.3*  --   --   --   HGB 13.5 12.9 11.4* 10.5*  HCT 41.8 40.4 36.4 32.0*  MCV 95.0 95.5 96.3 94.4  PLT 199 152 135* 135*   Cardiac Enzymes: No results for input(s): CKTOTAL, CKMB, CKMBINDEX, TROPONINI in the last 168 hours. BNP (last 3 results) No results for input(s): PROBNP in the last 8760 hours. CBG: No results for input(s): GLUCAP in the last 168 hours. D-Dimer: No results for input(s): DDIMER in the last 72 hours. Hgb A1c: No results for input(s): HGBA1C in the last 72 hours. Lipid Profile: No results for input(s): CHOL, HDL, LDLCALC, TRIG, CHOLHDL, LDLDIRECT in the last 72 hours. Thyroid function studies: No results for input(s): TSH, T4TOTAL, T3FREE, THYROIDAB in the last 72 hours.  Invalid input(s): FREET3 Anemia work up: No results for input(s): VITAMINB12, FOLATE, FERRITIN, TIBC, IRON, RETICCTPCT in the last 72 hours. Sepsis Labs: Recent  Labs  Lab 09/26/20 1029 09/27/20 1834 09/28/20 0541 09/29/20 0351  WBC 12.4* 13.6* 9.0 6.8    Microbiology Recent Results (from the past 240 hour(s))  SARS Coronavirus 2 by RT PCR (hospital order, performed in [facility] hospital lab) Nasopharyngeal Nasopharyngeal Swab     Status: None   Collection Time: 09/26/20 12:02 PM   Specimen: Nasopharyngeal Swab  Result Value Ref Range Status   SARS Coronavirus 2 NEGATIVE NEGATIVE Final    Comment: (NOTE) SARS-CoV-2 target nucleic acids are NOT DETECTED.  The SARS-CoV-2 RNA is generally detectable in upper and lower respiratory specimens during the acute phase of infection. The lowest concentration of SARS-CoV-2 viral copies this assay can detect is 250 copies / mL. A negative result does not preclude SARS-CoV-2 infection and should not be used as the sole basis for treatment or other patient management decisions.  A negative result may occur with improper specimen collection / handling, submission of specimen other than nasopharyngeal swab, presence of viral mutation(s) within the areas targeted by this assay, and inadequate number of viral copies (<250 copies / mL). A negative result must be combined with clinical observations, patient history, and epidemiological information.  Fact Sheet for Patients:   BoilerBrush.com.cy  Fact Sheet for Healthcare Providers: https://pope.com/  This test is not yet approved or  cleared by the [place] FDA and has been authorized for detection and/or diagnosis of SARS-CoV-2 by FDA under an Emergency Use Authorization (EUA).  This EUA will remain in effect (meaning this test can be used) for the duration of the COVID-19 declaration under Section 564(b)(1) of the Act, 21 U.S.C. section 360bbb-3(b)(1), unless the authorization is terminated or revoked sooner.  Performed at [facility], [address] [place], [place] [address]   C  Difficile Quick Screen w PCR reflex     Status: Abnormal   Collection Time: 09/27/20 10:35 AM   Specimen: STOOL  Result Value Ref Range Status   C Diff antigen POSITIVE (A) NEGATIVE Final   C Diff toxin NEGATIVE NEGATIVE Final   C Diff interpretation Results are indeterminate. See PCR results.  Final    Comment: Performed at [facility], [address] [place], [place] [address]  C. Diff by PCR, Reflexed     Status: Abnormal   Collection Time: 09/27/20 10:35 AM  Result Value Ref Range Status   Toxigenic C. Difficile by PCR POSITIVE (A) NEGATIVE Final    Comment: Positive for toxigenic C. difficile with little to no toxin production. Only treat if clinical presentation suggests symptomatic illness. Performed at [facility], [address] [place], [place] [address]     Procedures and diagnostic studies:  No results found.             LOS: 4 days   Tonette Koehne  [facility]   Pager on [email or web address]. If 7PM-7AM, please contact night-coverage at [email or web address]     09/30/2020, 11:11 AM

## 2020-10-01 NOTE — Progress Notes (Signed)
PROGRESS NOTE    [name]  VEL:[id number] DOB: [birth year] DOA: 09/26/2020 PCP: [name], NP   Brief Narrative: Taken from H&P. [name] is a 85 y.o. female with medical history significant for pulmonary fibrosis on 2 L of oxygen at night, chronic low back pain, anxiety who presents to the ER via EMS for evaluation of right hip pain following a fall.  Patient had an appointment and was trying to get into her car when she tripped on ice and fell.  Imaging positive for right subcapital femoral neck fracture.  Orthopedic was consulted and patient was taken to the OR on 09/27/2020 for hemiarthroplasty.  Tolerated the procedure well. PT is recommending SNF placement.  Subjective: Patient was working with PT when seen today.  Having minimum pain. Diarrhea improved with starting of fidaxomicin.  Assessment & Plan:   Principal Problem:   Subcapital fracture of femur, right, closed, initial encounter ([place]) Active Problems:   Anxiety   HTN (hypertension)   Idiopathic pulmonary fibrosis (HCC)   C. difficile diarrhea  Subcapital right femoral neck fracture.  Secondary to mechanical fall. S/p hemiarthroplasty on 09/27/2020. -PT is recommending SNF placement. -Continue with pain management  Idiopathic pulmonary fibrosis.  Patient uses 2 L of oxygen at night. -Continue supplemental oxygen. -Continue home dose of pirfenidone  Anxiety/depression.  No acute concern. -Continue Celexa  C. difficile positive.  Patient was tested positive for C. difficile antigen and PCR with very mild to no oxygen SCD.  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 No leukocytosis or abdominal pain.  Patient is having multiple soft bowel movements, no watery diarrhea. -Symptoms improved with starting of Fidaxomicin-started on 1/28, for 10 days. -Continue with enteric precautions   Objective: Vitals:   10/01/20 0019 10/01/20 0443  10/01/20 0752 10/01/20 1151  BP: (!) 149/81 (!) 145/88 121/60 129/65  Pulse: 71 69 64 71  Resp:   17 18  Temp: 97.7 F (36.5 C) 98 F (36.7 C) 98.1 F (36.7 C) 98.6 F (37 C)  TempSrc: Oral Oral Oral   SpO2: 96% 98% 95% 96%  Weight:      Height:       No intake or output data in the 24 hours ending 10/01/20 1415 Filed Weights   09/27/20 0300 09/27/20 1432 09/28/20 0243  Weight: 62.2 kg 62.2 kg 65.4 kg    Examination:  General.  Well-developed elderly lady, in no acute distress. Pulmonary.  Lungs clear bilaterally, normal respiratory effort. CV.  Regular rate and rhythm, no JVD, rub or murmur. Abdomen.  Soft, nontender, nondistended, BS positive. CNS.  Alert and oriented x3.  No focal neurologic deficit. Extremities.  No edema, no cyanosis, pulses intact and symmetrical. Psychiatry.  Judgment and insight appears normal.  DVT prophylaxis: Lovenox Code Status: Full Family Communication: Discussed with patient Disposition Plan:  Status is: Inpatient  Remains inpatient appropriate because:Inpatient level of care appropriate due to severity of illness   Dispo: The patient is from: Home              Anticipated d/c is to: To be determined, PT recommending SNF, patient has not decided yet.              Anticipated d/c date is: 2 days              Patient currently is not medically stable to d/c.   Difficult to place patient No  Level of care: Med-Surg  Consultants:   Orthopedic surgery  Procedures:  Antimicrobials:  Dificid started on 09/29/2020.  Data Reviewed: I have personally reviewed following labs and imaging studies  CBC: Recent Labs  Lab 09/26/20 1029 09/27/20 1834 09/28/20 0541 09/29/20 0351  WBC 12.4* 13.6* 9.0 6.8  NEUTROABS 9.3*  --   --   --   HGB 13.5 12.9 11.4* 10.5*  HCT 41.8 40.4 36.4 32.0*  MCV 95.0 95.5 96.3 94.4  PLT 199 152 135* 502*   Basic Metabolic Panel: Recent Labs  Lab 09/26/20 1029 09/27/20 1834 09/28/20 0541  09/29/20 0351  NA 136  --  136 138  K 3.6  --  3.4* 4.0  CL 101  --  103 106  CO2 23  --  24 25  GLUCOSE 96  --  93 109*  BUN 16  --  21 29*  CREATININE 0.69 0.86 0.62 0.70  CALCIUM 8.5*  --  7.8* 8.5*  MG  --   --   --  2.1   GFR: Estimated Creatinine Clearance: 45.8 mL/min (by C-G formula based on SCr of 0.7 mg/dL). Liver Function Tests: Recent Labs  Lab 09/26/20 1029  AST 31  ALT 18  ALKPHOS 58  BILITOT 0.8  PROT 7.0  ALBUMIN 3.6   No results for input(s): LIPASE, AMYLASE in the last 168 hours. No results for input(s): AMMONIA in the last 168 hours. Coagulation Profile: No results for input(s): INR, PROTIME in the last 168 hours. Cardiac Enzymes: No results for input(s): CKTOTAL, CKMB, CKMBINDEX, TROPONINI in the last 168 hours. BNP (last 3 results) No results for input(s): PROBNP in the last 8760 hours. HbA1C: No results for input(s): HGBA1C in the last 72 hours. CBG: No results for input(s): GLUCAP in the last 168 hours. Lipid Profile: No results for input(s): CHOL, HDL, LDLCALC, TRIG, CHOLHDL, LDLDIRECT in the last 72 hours. Thyroid Function Tests: No results for input(s): TSH, T4TOTAL, FREET4, T3FREE, THYROIDAB in the last 72 hours. Anemia Panel: No results for input(s): VITAMINB12, FOLATE, FERRITIN, TIBC, IRON, RETICCTPCT in the last 72 hours. Sepsis Labs: No results for input(s): PROCALCITON, LATICACIDVEN in the last 168 hours.  Recent Results (from the past 240 hour(s))  SARS Coronavirus 2 by RT PCR (hospital order, performed in [facility] hospital lab) Nasopharyngeal Nasopharyngeal Swab     Status: None   Collection Time: 09/26/20 12:02 PM   Specimen: Nasopharyngeal Swab  Result Value Ref Range Status   SARS Coronavirus 2 NEGATIVE NEGATIVE Final    Comment: (NOTE) SARS-CoV-2 target nucleic acids are NOT DETECTED.  The SARS-CoV-2 RNA is generally detectable in upper and lower respiratory specimens during the acute phase of infection. The  lowest concentration of SARS-CoV-2 viral copies this assay can detect is 250 copies / mL. A negative result does not preclude SARS-CoV-2 infection and should not be used as the sole basis for treatment or other patient management decisions.  A negative result may occur with improper specimen collection / handling, submission of specimen other than nasopharyngeal swab, presence of viral mutation(s) within the areas targeted by this assay, and inadequate number of viral copies (<250 copies / mL). A negative result must be combined with clinical observations, patient history, and epidemiological information.  Fact Sheet for Patients:   StrictlyIdeas.no  Fact Sheet for Healthcare Providers: BankingDealers.co.za  This test is not yet approved or  cleared by the [place] FDA and has been authorized for detection and/or diagnosis of SARS-CoV-2 by FDA  under an Emergency Use Authorization (EUA).  This EUA will remain in effect (meaning this test can be used) for the duration of the COVID-19 declaration under Section 564(b)(1) of the Act, 21 U.S.C. section 360bbb-3(b)(1), unless the authorization is terminated or revoked sooner.  Performed at [facility], [place], [place] [address]   C Difficile Quick Screen w PCR reflex     Status: Abnormal   Collection Time: 09/27/20 10:35 AM   Specimen: STOOL  Result Value Ref Range Status   C Diff antigen POSITIVE (A) NEGATIVE Final   C Diff toxin NEGATIVE NEGATIVE Final   C Diff interpretation Results are indeterminate. See PCR results.  Final    Comment: Performed at [facility], [address] [place], [place] [address]  C. Diff by PCR, Reflexed     Status: Abnormal   Collection Time: 09/27/20 10:35 AM  Result Value Ref Range Status   Toxigenic C. Difficile by PCR POSITIVE (A) NEGATIVE Final    Comment: Positive for toxigenic C. difficile with little to no  toxin production. Only treat if clinical presentation suggests symptomatic illness. Performed at [facility], [address], [place], [place] [address]      Radiology Studies: No results found.  Scheduled Meds: . Chlorhexidine Gluconate Cloth  6 each Topical Daily  . citalopram  40 mg Oral Daily  . enoxaparin (LOVENOX) injection  40 mg Subcutaneous Q24H  . feeding supplement  237 mL Oral BID BM  . fidaxomicin  200 mg Oral BID  . HYDROmorphone  4 mg Oral TID  . multivitamin with minerals  1 tablet Oral Daily  . nutrition supplement (JUVEN)  1 packet Oral BID BM  . pantoprazole  40 mg Oral Daily  . Pirfenidone  6 tablet Oral Daily  . sulfaSALAzine  500 mg Oral BID   Continuous Infusions: . methocarbamol (ROBAXIN) IV       LOS: 5 days   Time spent: 25 minutes  Lorella Nimrod, MD [facility]  If 7PM-7AM, please contact night-coverage [email or web address]  10/01/2020, 2:15 PM   This record has been created using Systems analyst. Errors have been sought and corrected,but may not always be located. Such creation errors do not reflect on the standard of care.

## 2020-10-01 NOTE — Progress Notes (Signed)
Physical Therapy Treatment Patient Details Name: [name] MRN: [id number] DOB: [birth year] Today's Date: 10/01/2020    History of Present Illness Pt admitted for subcapital fx of femur secondary to fall on ice and now s/p R ant hip hemi replacement on 1/26. History includes anxiety, HTN, pulm fibrosis (2L of O2 at night), and chronic LBP.    PT Comments    Participated in exercises as described below.  OOB and progressed gait 40' in room with RW and min a x 1.  Pt generally steady but slow gait.  She attempts a second lap to door but fatigues and needs encouragement and standing rest to return to chair.  Overall activity tolerance remains low.  SNF remains appropriate for discharge.   Follow Up Recommendations  SNF     Equipment Recommendations  None recommended by PT    Recommendations for Other Services       Precautions / Restrictions Precautions Precautions: Anterior Hip;Fall Precaution Booklet Issued: Yes (comment) Restrictions Weight Bearing Restrictions: Yes RLE Weight Bearing: Weight bearing as tolerated    Mobility  Bed Mobility Overal bed mobility: Needs Assistance Bed Mobility: Supine to Sit     Supine to sit: Min assist        Transfers Overall transfer level: Needs assistance Equipment used: Rolling walker (2 wheeled) Transfers: Sit to/from Stand Sit to Stand: Min guard;Min assist            Ambulation/Gait   Gait Distance (Feet): 40 Feet Assistive device: Rolling walker (2 wheeled) Gait Pattern/deviations: Step-through pattern;Trunk flexed Gait velocity: decreased   General Gait Details: increased distance but fatigue noted with 2nd lap to door and back   Stairs             Wheelchair Mobility    Modified Rankin (Stroke Patients Only)       Balance Overall balance assessment: Needs assistance Sitting-balance support: Bilateral upper extremity supported;Feet supported Sitting balance-Leahy Scale: Good Sitting  balance - Comments: no LOB sitting EOB with feet support only   Standing balance support: Bilateral upper extremity supported;During functional activity Standing balance-Leahy Scale: Good                              Cognition Arousal/Alertness: Awake/alert Behavior During Therapy: WFL for tasks assessed/performed Overall Cognitive Status: Within Functional Limits for tasks assessed                                 General Comments: A and O x 4 and cooperative and pleasant      Exercises Total Joint Exercises Ankle Circles/Pumps: AROM;10 reps Quad Sets: AROM;10 reps Gluteal Sets: AROM;10 reps Towel Squeeze: AROM;10 reps Heel Slides: AROM;10 reps (through minimal ROM) Hip ABduction/ADduction: AAROM;10 reps Straight Leg Raises: AROM;10 reps    General Comments        Pertinent Vitals/Pain Pain Assessment: No/denies pain    Home Living                      Prior Function            PT Goals (current goals can now be found in the care plan section) Progress towards PT goals: Progressing toward goals    Frequency    BID      PT Plan Current plan remains appropriate    Co-evaluation  AM-PAC PT "6 Clicks" Mobility   Outcome Measure  Help needed turning from your back to your side while in a flat bed without using bedrails?: A Little Help needed moving from lying on your back to sitting on the side of a flat bed without using bedrails?: A Little Help needed moving to and from a bed to a chair (including a wheelchair)?: A Little Help needed standing up from a chair using your arms (e.g., wheelchair or bedside chair)?: A Little Help needed to walk in hospital room?: A Little Help needed climbing 3-5 steps with a railing? : A Little 6 Click Score: 18    End of Session Equipment Utilized During Treatment: Gait belt;Oxygen (2 L desat to 84% without O2) Activity Tolerance: Patient tolerated treatment well Patient  left: in bed;with call bell/phone within reach;with bed alarm set Nurse Communication: Mobility status PT Visit Diagnosis: Muscle weakness (generalized) (M62.81);Difficulty in walking, not elsewhere classified (R26.2);History of falling (Z91.81);Pain Pain - Right/Left: Right Pain - part of body: Hip     Time: 1040-1108 PT Time Calculation (min) (ACUTE ONLY): 28 min  Charges:  $Gait Training: 8-22 mins $Therapeutic Exercise: 8-22 mins                    Chesley Noon, PTA 10/01/20, 12:30 PM

## 2020-10-02 DIAGNOSIS — J8410 Pulmonary fibrosis, unspecified: Secondary | ICD-10-CM | POA: Diagnosis not present

## 2020-10-02 DIAGNOSIS — R279 Unspecified lack of coordination: Secondary | ICD-10-CM | POA: Diagnosis not present

## 2020-10-02 DIAGNOSIS — J84112 Idiopathic pulmonary fibrosis: Secondary | ICD-10-CM | POA: Diagnosis not present

## 2020-10-02 DIAGNOSIS — F39 Unspecified mood [affective] disorder: Secondary | ICD-10-CM | POA: Diagnosis not present

## 2020-10-02 DIAGNOSIS — W19XXXD Unspecified fall, subsequent encounter: Secondary | ICD-10-CM | POA: Diagnosis not present

## 2020-10-02 DIAGNOSIS — S7291XA Unspecified fracture of right femur, initial encounter for closed fracture: Secondary | ICD-10-CM | POA: Diagnosis not present

## 2020-10-02 DIAGNOSIS — M810 Age-related osteoporosis without current pathological fracture: Secondary | ICD-10-CM | POA: Diagnosis not present

## 2020-10-02 DIAGNOSIS — M6281 Muscle weakness (generalized): Secondary | ICD-10-CM | POA: Diagnosis not present

## 2020-10-02 DIAGNOSIS — E559 Vitamin D deficiency, unspecified: Secondary | ICD-10-CM | POA: Diagnosis not present

## 2020-10-02 DIAGNOSIS — E669 Obesity, unspecified: Secondary | ICD-10-CM | POA: Diagnosis not present

## 2020-10-02 DIAGNOSIS — F419 Anxiety disorder, unspecified: Secondary | ICD-10-CM | POA: Diagnosis not present

## 2020-10-02 DIAGNOSIS — R5381 Other malaise: Secondary | ICD-10-CM | POA: Diagnosis not present

## 2020-10-02 DIAGNOSIS — S72011D Unspecified intracapsular fracture of right femur, subsequent encounter for closed fracture with routine healing: Secondary | ICD-10-CM | POA: Diagnosis not present

## 2020-10-02 DIAGNOSIS — I10 Essential (primary) hypertension: Secondary | ICD-10-CM | POA: Diagnosis not present

## 2020-10-02 DIAGNOSIS — Z8616 Personal history of COVID-19: Secondary | ICD-10-CM | POA: Diagnosis not present

## 2020-10-02 DIAGNOSIS — Z6830 Body mass index (BMI) 30.0-30.9, adult: Secondary | ICD-10-CM | POA: Diagnosis not present

## 2020-10-02 DIAGNOSIS — S72011A Unspecified intracapsular fracture of right femur, initial encounter for closed fracture: Secondary | ICD-10-CM | POA: Diagnosis not present

## 2020-10-02 DIAGNOSIS — G4730 Sleep apnea, unspecified: Secondary | ICD-10-CM | POA: Diagnosis not present

## 2020-10-02 DIAGNOSIS — A0472 Enterocolitis due to Clostridium difficile, not specified as recurrent: Secondary | ICD-10-CM | POA: Diagnosis not present

## 2020-10-02 DIAGNOSIS — E7800 Pure hypercholesterolemia, unspecified: Secondary | ICD-10-CM | POA: Diagnosis not present

## 2020-10-02 DIAGNOSIS — G8929 Other chronic pain: Secondary | ICD-10-CM | POA: Diagnosis not present

## 2020-10-02 DIAGNOSIS — E538 Deficiency of other specified B group vitamins: Secondary | ICD-10-CM | POA: Diagnosis not present

## 2020-10-02 LAB — SARS CORONAVIRUS 2 BY RT PCR (HOSPITAL ORDER, PERFORMED IN ~~LOC~~ HOSPITAL LAB): SARS Coronavirus 2: NEGATIVE

## 2020-10-02 LAB — SARS CORONAVIRUS 2 (TAT 6-24 HRS): SARS Coronavirus 2: NEGATIVE

## 2020-10-02 MED ORDER — ADULT MULTIVITAMIN W/MINERALS CH: 1.0000 | ORAL_TABLET | Freq: Every day | ORAL | Status: DC

## 2020-10-02 MED ORDER — COVID-19 MRNA VAC-TRIS(PFIZER) 30 MCG/0.3ML IM SUSP
0.3000 mL | Freq: Once | INTRAMUSCULAR | Status: AC
  Administered 2020-10-02: 0.3 mL via INTRAMUSCULAR
  Filled 2020-10-02: qty 0.3

## 2020-10-02 MED ORDER — DICYCLOMINE HCL 10 MG PO CAPS: 10.0000 mg | ORAL_CAPSULE | Freq: Three times a day (TID) | ORAL | 0 refills | Status: DC | PRN

## 2020-10-02 MED ORDER — ENSURE ENLIVE PO LIQD: 237.0000 mL | Freq: Two times a day (BID) | ORAL | 12 refills | Status: DC

## 2020-10-02 MED ORDER — FIDAXOMICIN 200 MG PO TABS: 200.0000 mg | ORAL_TABLET | Freq: Two times a day (BID) | ORAL | 0 refills | Status: AC

## 2020-10-02 NOTE — Progress Notes (Signed)
Patient is stable and ready for discharge to [facility]. Patient's IV removed. Patient had rapid COVID prior to d/c which was negative. Patient packed her own belongings and NT assisted with getting her dressed. Writer called report and spoke wit Janett Billow and she verbalized understanding of report provided. Patient prescriptions placed in discharge. Patient transported by [facility].

## 2020-10-02 NOTE — Progress Notes (Signed)
   Subjective: 5 Days Post-Op Procedure(s) (LRB): ARTHROPLASTY BIPOLAR HIP (HEMIARTHROPLASTY) (Right) Patient reports pain as mild.   Patient is well, and has had no acute complaints or problems Denies any CP, SOB, ABD pain. We will continue with therapy today, making good progress.   Objective: Vital signs in last 24 hours: Temp:  [97.7 F (36.5 C)-98.7 F (37.1 C)] 98.7 F (37.1 C) (01/31 0734) Pulse Rate:  [59-76] 59 (01/31 0734) Resp:  [16-18] 16 (01/31 0734) BP: (100-145)/(62-88) 127/74 (01/31 0734) SpO2:  [94 %-99 %] 98 % (01/31 0734)  Intake/Output from previous day: No intake/output data recorded. Intake/Output this shift: No intake/output data recorded.  No results for input(s): HGB in the last 72 hours. No results for input(s): WBC, RBC, HCT, PLT in the last 72 hours. No results for input(s): NA, K, CL, CO2, BUN, CREATININE, GLUCOSE, CALCIUM in the last 72 hours. No results for input(s): LABPT, INR in the last 72 hours.  EXAM General - Patient is Alert, Appropriate and Oriented Extremity - Neurovascular intact Sensation intact distally Intact pulses distally Dorsiflexion/Plantar flexion intact No cellulitis present Compartment soft Dressing - dressing C/D/I and no drainage, prevena intact with out drainage Motor Function - intact, moving foot and toes well on exam.   Past Medical History:  Diagnosis Date  . Altered mental state   . Anxiety   . BMI 30.0-30.9,adult   . Cellulitis   . Chest pain, atypical   . Compression fracture of L4 lumbar vertebra   . Constipation   . Cystitis   . Cystocele   . Diverticulosis   . Fatigue   . Fibrosis, idiopathic pulmonary ([place])   . Gastroenteritis   . History of back surgery   . History of herniated intervertebral disc   . HTN (hypertension)   . Hypercholesteremia   . Hypocalcemia   . Incontinence of urine   . OA (osteoarthritis)    shoulder and back  . Obesity   . Osteopenia   . Pneumonia   . Shortness of  breath   . Sleep apnea   . Squamous cell carcinoma of skin 07/16/2016   Right upper lateral eyebrow. SCCis.  . Squamous cell carcinoma of skin 01/13/2018   Left temporal hairline. WD SCC with superficial infiltration.  Ritta Slot   . Vitamin D deficiency     Assessment/Plan:   5 Days Post-Op Procedure(s) (LRB): ARTHROPLASTY BIPOLAR HIP (HEMIARTHROPLASTY) (Right) Principal Problem:   Subcapital fracture of femur, right, closed, initial encounter ([place]) Active Problems:   Anxiety   HTN (hypertension)   Idiopathic pulmonary fibrosis (HCC)   C. difficile diarrhea  Estimated body mass index is 28.16 kg/m as calculated from the following:   Height as of this encounter: 5' (1.524 m).   Weight as of this encounter: 65.4 kg. Advance diet Up with therapy  VSS Pain well controlled CM to assist with discharge to SNF.  Follow up with [facility] ortho 2 weeks post op Please remove provena negative pressure dressing on 10/09/20 and apply honey comb dressing. Keep dressing clean and dry at all times. TED hose BLE x 6 weeks Lovenox 40 mg SQ daily x 14 days   DVT Prophylaxis - Lovenox, Foot Pumps and TED hose Weight-Bearing as tolerated to right leg   T. Rachelle Hora, PA-C [place] 10/02/2020, 8:04 AM

## 2020-10-02 NOTE — TOC Transition Note (Addendum)
Transition of Care [facility]) - CM/SW Discharge Note   Patient Details  Name: [name] MRN: [id number] Date of Birth: [birth year]  [facility] CM/SW Contact:  Magnus Ivan, LCSW Phone Number: 10/02/2020, 2:10 PM   Clinical Narrative:   Patient to discharge to [facility] today, Room 103. Confirmed with Seth Bake at [facility]. CSW updated MD, LPN, and patient. Asked LPN to call report and MD to submit DC Summary. Medical Necessity Form and Face Sheet placed in Discharge Packet by patient chart. [facility] arranged for 4:00 pm.   Final next level of care: [place] Barriers to Discharge: Barriers Resolved   Patient Goals and CMS Choice Patient states their goals for this hospitalization and ongoing recovery are:: SNF rehab CMS Medicare.gov Compare Post Acute Care list provided to:: Patient Choice offered to / list presented to : Patient  Discharge Placement              Patient chooses bed at: [facility] Patient to be transferred to facility by: EMS Name of family member notified: patient notified Patient and family notified of of transfer: 10/02/20  Discharge Plan and Services                                     Social Determinants of Health (SDOH) Interventions     Readmission Risk Interventions No flowsheet data found.

## 2020-10-02 NOTE — Progress Notes (Signed)
Occupational Therapy Treatment Patient Details Name: [name] MRN: [id number] DOB: 19-Oct-1935 Today's Date: 10/02/2020    History of present illness Pt admitted for subcapital fx of femur secondary to fall on ice and now s/p R ant hip hemi replacement on 1/26. History includes anxiety, HTN, pulm fibrosis (2L of O2 at night), and chronic LBP.   OT comments  Upon entering the room, pt transitioning from PT session and ambulation. Pt very motivated and agreeable to OT intervention and requesting to "wash off". Pt seated in recliner chair for bathing with focus on sit <>stand and self care modifications. Pt needing min guard for balance while standing for peri hygiene. Pt does fatigue quickly and takes rest breaks as needed. Energy conservation education started and will continue to be addressed. Pt needing min cuing for safety awareness. Discharge recommendation changed to [facility] based on pt's current progress. Pt continues to benefit from OT intervention.    Follow Up Recommendations  Supervision/Assistance - 24 hour;Home health OT    Equipment Recommendations  None recommended by OT       Precautions / Restrictions Precautions Precautions: Anterior Hip;Fall Precaution Booklet Issued: Yes (comment) Restrictions Weight Bearing Restrictions: Yes RLE Weight Bearing: Weight bearing as tolerated       Mobility Bed Mobility               General bed mobility comments: OOB upon arrival  Transfers Overall transfer level: Needs assistance Equipment used: Rolling walker (2 wheeled) Transfers: Sit to/from [employer] Transfers Sit to Stand: Min guard Stand pivot transfers: Min guard            Balance Overall balance assessment: Needs assistance Sitting-balance support: Bilateral upper extremity supported;Feet supported Sitting balance-Leahy Scale: Good Sitting balance - Comments: no LOB sitting EOB with feet support only   Standing balance support: Bilateral  upper extremity supported;During functional activity Standing balance-Leahy Scale: Good                             ADL either performed or assessed with clinical judgement   ADL           Upper Body Bathing: Sitting;Set up;Supervision/ safety   Lower Body Bathing: Min guard;Sit to/from stand   Upper Body Dressing : Set up;Supervision/safety;Sitting   Lower Body Dressing: Min guard;Sit to/from stand   Toilet Transfer: Forensic scientist- Water quality scientist and Hygiene: Min guard;Sit to/from stand       Functional mobility during ADLs: Min guard;Rolling walker       Vision Baseline Vision/History: Wears glasses Wears Glasses: At all times Patient Visual Report: No change from baseline            Cognition Arousal/Alertness: Awake/alert Behavior During Therapy: WFL for tasks assessed/performed Overall Cognitive Status: Within Functional Limits for tasks assessed                                 General Comments: A and O x 4 and cooperative and pleasant                   Pertinent Vitals/ Pain       Pain Assessment: No/denies pain         Frequency  Min 1X/week        Progress Toward Goals  OT Goals(current goals can now be found in the care plan  section)  Progress towards OT goals: Progressing toward goals  Acute Rehab OT Goals Patient Stated Goal: to go home after rehab OT Goal Formulation: With patient Time For Goal Achievement: 10/12/20 Potential to Achieve Goals: Good  Plan Discharge plan needs to be updated       AM-PAC OT "6 Clicks" Daily Activity     Outcome Measure   Help from another person eating meals?: None Help from another person taking care of personal grooming?: A Little Help from another person toileting, which includes using toliet, bedpan, or urinal?: A Little Help from another person bathing (including washing, rinsing, drying)?: A Little Help from another person to  put on and taking off regular upper body clothing?: None Help from another person to put on and taking off regular lower body clothing?: A Little 6 Click Score: 20    End of Session Equipment Utilized During Treatment: Rolling walker;Oxygen  OT Visit Diagnosis: Unsteadiness on feet (R26.81);Muscle weakness (generalized) (M62.81)   Activity Tolerance Patient limited by fatigue   Patient Left in chair;with chair alarm set;with nursing/sitter in room;with call bell/phone within reach   Nurse Communication Mobility status        Time: 1010-1037 OT Time Calculation (min): 27 min  Charges: OT General Charges $OT Visit: 1 Visit OT Treatments $Self Care/Home Management : 23-37 mins  Darleen Crocker, MS, OTR/L , CBIS ascom [phone]  10/02/20, 1:40 PM

## 2020-10-02 NOTE — Discharge Summary (Signed)
Physician Discharge Summary  [name] EXH:[id number] DOB: 1936-05-07 DOA: 09/26/2020  PCP: [name], NP  Admit date: 09/26/2020 Discharge date: 10/02/2020  Admitted From: Home Disposition: SNF  Recommendations for Outpatient Follow-up:  1. Follow up with PCP in 1-2 weeks 2. Follow-up with orthopedic surgery 3. Follow-up with gastroenterology 4. Please obtain BMP/CBC in one week 5. Please follow up on the following pending results: None  Home Health: No Equipment/Devices: Home oxygen Discharge Condition: Stable CODE STATUS: Full Diet recommendation: Heart Healthy   Brief/Interim Summary: Terri Flom Thompsonis a 85 y.o.femalewith medical history significant forpulmonary fibrosis on 2 L of oxygen at night, chronic low back pain, anxiety who presents to the ER via EMS for evaluation of right hip pain following a fall. Patient had an appointment and was trying to get into her car when she tripped on ice and fell.  Imaging positive for right subcapital femoral neck fracture.  Orthopedic was consulted and patient was taken to the OR on 09/27/2020 for hemiarthroplasty.  Tolerated the procedure well.  Orthopedic is recommending" Please remove provena negative pressure dressing on 10/09/20 and apply honey comb dressing. Keep dressing clean and dry at all times."  She was also discharged on 14 days of Lovenox and continue TED hose for 6 weeks for DVT prophylaxis.  She will use pain medications as needed.  PT is recommending SNF placement and she is being discharged to rehab for further management.  Patient also has an history of idiopathic pulmonary fibrosis, uses 2 L of oxygen at night and pirfenidone which she will continue.  She will follow-up with her pulmonologist as an outpatient for further management.  Patient has an history of chronic C. difficile colitis, being managed by a gastroenterologist and use p.o. vancomycin for more than a month. C. difficile was checked  again because of her diarrhea, no abdominal pain, no leukocytosis or fever.  It was positive for antigen and negative for toxigenic city.  PCR was also positive with very mild to no toxin production.  Discussed with ID and because of her persistent watery diarrhea we started her on Fidaxomicin- on 1/28, for 10 days.  Resulted in improvement of her diarrhea. She will need to follow-up with gastroenterology as an outpatient for more advanced management if needed.  She will continue with rest of her home medications and follow-up with her providers.  Discharge Diagnoses:  Principal Problem:   Subcapital fracture of femur, right, closed, initial encounter ([place]) Active Problems:   Anxiety   HTN (hypertension)   Idiopathic pulmonary fibrosis (HCC)   C. difficile diarrhea   Discharge Instructions  Discharge Instructions    Diet - low sodium heart healthy   Complete by: As directed    Discharge wound care:   Complete by: As directed    Please remove provena negative pressure dressing on 10/09/20 and apply honey comb dressing. Keep dressing clean and dry at all times. Reinforce dressing as needed   Increase activity slowly   Complete by: As directed      Allergies as of 10/02/2020   No Known Allergies     Medication List    STOP taking these medications   HYDROmorphone 4 MG tablet Commonly known as: DILAUDID     TAKE these medications   citalopram 40 MG tablet Commonly known as: CELEXA TAKE 1 TABLET (40 MG TOTAL) BY MOUTH DAILY. FOR ANXIETY.   denosumab 60 MG/ML Sosy injection Commonly known as: PROLIA Inject 60 mg into the skin every  6 (six) months.   dicyclomine 10 MG capsule Commonly known as: BENTYL Take 1 capsule (10 mg total) by mouth 3 (three) times daily as needed for spasms.   enoxaparin 40 MG/0.4ML injection Commonly known as: LOVENOX Inject 0.4 mLs (40 mg total) into the skin daily for 14 days.   Esbriet 267 MG Tabs Generic drug: Pirfenidone Take 267 mg by  mouth 3 (three) times daily with meals.   feeding supplement Liqd Take 237 mLs by mouth 2 (two) times daily between meals.   fidaxomicin 200 MG Tabs tablet Commonly known as: DIFICID Take 1 tablet (200 mg total) by mouth 2 (two) times daily for 7 days.   HYDROcodone-acetaminophen 5-325 MG tablet Commonly known as: NORCO/VICODIN Take 1 tablet by mouth every 6 (six) hours as needed for moderate pain.   multivitamin with minerals Tabs tablet Take 1 tablet by mouth daily.   OXYGEN Inhale into the lungs. 2 L at night   sulfaSALAzine 500 MG tablet Commonly known as: AZULFIDINE Take 1 tablet (500 mg total) by mouth 2 (two) times daily.   Voltaren 1 % Gel Generic drug: diclofenac Sodium Apply 2 g topically in the morning, at noon, and at bedtime. Shoulders and back            Durable Medical Equipment  (From admission, onward)         Start     Ordered   09/27/20 1827  DME Walker rolling  Once       Question Answer Comment  Walker: With 5 Inch Wheels   Patient needs a walker to treat with the following condition S/P hip hemiarthroplasty      09/27/20 1827   09/27/20 1827  DME 3 n 1  Once        09/27/20 1827   09/27/20 1827  DME Bedside commode  Once       Question:  Patient needs a bedside commode to treat with the following condition  Answer:  S/P hip hemiarthroplasty   09/27/20 1827           Discharge Care Instructions  (From admission, onward)         Start     Ordered   10/02/20 0000  Discharge wound care:       Comments: Please remove provena negative pressure dressing on 10/09/20 and apply honey comb dressing. Keep dressing clean and dry at all times. Reinforce dressing as needed   10/02/20 1223          Follow-up Information    Duanne Guess, PA-C Follow up in 2 week(s).   Specialties: Orthopedic Surgery, Emergency Medicine Contact information: [place] [phone]        [name], NP. Schedule  an appointment as soon as possible for a visit.   Specialty: Internal Medicine Contact information: [place]  [phone]              No Known Allergies  Consultations:  Orthopedic surgery  Procedures/Studies: DG Chest 1 View  Result Date: 09/26/2020 CLINICAL DATA:  Fall.  Leg pain. EXAM: CHEST  1 VIEW COMPARISON:  08/15/2020 FINDINGS: Midline trachea. Mild cardiomegaly, accentuated by low lung volumes and AP portable technique. Basilar predominant subpleural interstitial thickening. Similar. No lobar consolidation. Osteopenia. Left shoulder arthroplasty. Thoracolumbar vertebral augmentation. IMPRESSION: No acute cardiopulmonary disease. Peripheral and basilar predominant interstitial thickening, suspicious for interstitial lung disease. Please see 08/03/2019 chest CT report. Electronically  Signed   By: Abigail Miyamoto M.D.   On: 09/26/2020 11:28   CT Head Wo Contrast  Result Date: 09/26/2020 CLINICAL DATA:  RIGHT hip pain, fall without reported loss of consciousness, reportedly hit LEFT side of head. EXAM: CT HEAD WITHOUT CONTRAST TECHNIQUE: Contiguous axial images were obtained from the base of the skull through the vertex without intravenous contrast. COMPARISON:  Prior exam from May 29, 2020 FINDINGS: Brain: No evidence of acute infarction, hemorrhage, hydrocephalus, extra-axial collection or mass lesion/mass effect. Signs of atrophy and mild chronic microvascular ischemic change as before. Vascular: No hyperdense vessel or unexpected calcification. Skull: Normal. Negative for fracture or focal lesion. Sinuses/Orbits: Visualized paranasal sinuses and orbits are unremarkable. Other: None. IMPRESSION: 1. No acute intracranial pathology. 2. Signs of atrophy and mild chronic microvascular ischemic change as before. Electronically Signed   By: Zetta Bills M.D.   On: 09/26/2020 11:45   DG HIP OPERATIVE UNILAT W OR W/O PELVIS RIGHT  Result Date:  09/27/2020 CLINICAL DATA:  Right hip operative EXAM: OPERATIVE right HIP (WITH PELVIS IF PERFORMED) 4 VIEWS TECHNIQUE: Fluoroscopic spot image(s) were submitted for interpretation post-operatively. COMPARISON:  09/26/2020 FINDINGS: Changes of right hip replacement. Normal AP alignment. No visible hardware complicating feature. IMPRESSION: Right hip replacement.  No visible complicating feature. Electronically Signed   By: Rolm Baptise M.D.   On: 09/27/2020 17:27   DG Femur Min 2 Views Right  Result Date: 09/26/2020 CLINICAL DATA:  Recent fall with hip pain, initial encounter EXAM: RIGHT FEMUR 2 VIEWS COMPARISON:  None. FINDINGS: Subcapital femoral neck fracture is noted with mild impaction at the fracture site. No other focal abnormality is seen. IMPRESSION: Subcapital femoral neck fracture. Electronically Signed   By: Inez Catalina M.D.   On: 09/26/2020 11:27    Subjective: Patient was sitting in chair comfortably when seen today.  No new complaint.  Able to work with PT.  Going to be discharged with negative pressure dressing, equipment was in her room.Marland Kitchen  Discharge Exam: Vitals:   10/02/20 0734 10/02/20 1119  BP: 127/74 125/62  Pulse: (!) 59 76  Resp: 16 17  Temp: 98.7 F (37.1 C) 98.6 F (37 C)  SpO2: 98% 96%   Vitals:   10/02/20 0039 10/02/20 0559 10/02/20 0734 10/02/20 1119  BP: (!) 145/71 107/62 127/74 125/62  Pulse: 67 61 (!) 59 76  Resp: 16  16 17   Temp: 98.1 F (36.7 C) 98 F (36.7 C) 98.7 F (37.1 C) 98.6 F (37 C)  TempSrc: Oral Oral    SpO2: 97% 99% 98% 96%  Weight:      Height:        General: Pt is alert, awake, not in acute distress Cardiovascular: RRR, S1/S2 +, no rubs, no gallops Respiratory: CTA bilaterally, no wheezing, no rhonchi Abdominal: Soft, NT, ND, bowel sounds + Extremities: no edema, no cyanosis   The results of significant diagnostics from this hospitalization (including imaging, microbiology, ancillary and laboratory) are listed below for  reference.    Microbiology: Recent Results (from the past 240 hour(s))  SARS Coronavirus 2 by RT PCR (hospital order, performed in [facility] hospital lab) Nasopharyngeal Nasopharyngeal Swab     Status: None   Collection Time: 09/26/20 12:02 PM   Specimen: Nasopharyngeal Swab  Result Value Ref Range Status   SARS Coronavirus 2 NEGATIVE NEGATIVE Final    Comment: (NOTE) SARS-CoV-2 target nucleic acids are NOT DETECTED.  The SARS-CoV-2 RNA is generally detectable in upper and lower respiratory  specimens during the acute phase of infection. The lowest concentration of SARS-CoV-2 viral copies this assay can detect is 250 copies / mL. A negative result does not preclude SARS-CoV-2 infection and should not be used as the sole basis for treatment or other patient management decisions.  A negative result may occur with improper specimen collection / handling, submission of specimen other than nasopharyngeal swab, presence of viral mutation(s) within the areas targeted by this assay, and inadequate number of viral copies (<250 copies / mL). A negative result must be combined with clinical observations, patient history, and epidemiological information.  Fact Sheet for Patients:   StrictlyIdeas.no  Fact Sheet for Healthcare Providers: BankingDealers.co.za  This test is not yet approved or  cleared by the [place] FDA and has been authorized for detection and/or diagnosis of SARS-CoV-2 by FDA under an Emergency Use Authorization (EUA).  This EUA will remain in effect (meaning this test can be used) for the duration of the COVID-19 declaration under Section 564(b)(1) of the Act, 21 U.S.C. section 360bbb-3(b)(1), unless the authorization is terminated or revoked sooner.  Performed at [facility], [place], [place] [address]   C Difficile Quick Screen w PCR reflex     Status: Abnormal   Collection Time: 09/27/20  10:35 AM   Specimen: STOOL  Result Value Ref Range Status   C Diff antigen POSITIVE (A) NEGATIVE Final   C Diff toxin NEGATIVE NEGATIVE Final   C Diff interpretation Results are indeterminate. See PCR results.  Final    Comment: Performed at [facility], [address] [place], [place] [address]  C. Diff by PCR, Reflexed     Status: Abnormal   Collection Time: 09/27/20 10:35 AM  Result Value Ref Range Status   Toxigenic C. Difficile by PCR POSITIVE (A) NEGATIVE Final    Comment: Positive for toxigenic C. difficile with little to no toxin production. Only treat if clinical presentation suggests symptomatic illness. Performed at [facility], [address] [place],  [address]      Labs: BNP (last 3 results) Recent Labs    05/29/20 0502  BNP 53.6   Basic Metabolic Panel: Recent Labs  Lab 09/26/20 1029 09/27/20 1834 09/28/20 0541 09/29/20 0351  NA 136  --  136 138  K 3.6  --  3.4* 4.0  CL 101  --  103 106  CO2 23  --  24 25  GLUCOSE 96  --  93 109*  BUN 16  --  21 29*  CREATININE 0.69 0.86 0.62 0.70  CALCIUM 8.5*  --  7.8* 8.5*  MG  --   --   --  2.1   Liver Function Tests: Recent Labs  Lab 09/26/20 1029  AST 31  ALT 18  ALKPHOS 58  BILITOT 0.8  PROT 7.0  ALBUMIN 3.6   No results for input(s): LIPASE, AMYLASE in the last 168 hours. No results for input(s): AMMONIA in the last 168 hours. CBC: Recent Labs  Lab 09/26/20 1029 09/27/20 1834 09/28/20 0541 09/29/20 0351  WBC 12.4* 13.6* 9.0 6.8  NEUTROABS 9.3*  --   --   --   HGB 13.5 12.9 11.4* 10.5*  HCT 41.8 40.4 36.4 32.0*  MCV 95.0 95.5 96.3 94.4  PLT 199 152 135* 135*   Cardiac Enzymes: No results for input(s): CKTOTAL, CKMB, CKMBINDEX, TROPONINI in the last 168 hours. BNP: Invalid input(s): POCBNP CBG: No results for input(s): GLUCAP in the last 168 hours.  D-Dimer No results for input(s): DDIMER in the last 72 hours. Hgb A1c No results for input(s): HGBA1C in the  last 72 hours. Lipid Profile No results for input(s): CHOL, HDL, LDLCALC, TRIG, CHOLHDL, LDLDIRECT in the last 72 hours. Thyroid function studies No results for input(s): TSH, T4TOTAL, T3FREE, THYROIDAB in the last 72 hours.  Invalid input(s): FREET3 Anemia work up No results for input(s): VITAMINB12, FOLATE, FERRITIN, TIBC, IRON, RETICCTPCT in the last 72 hours. Urinalysis    Component Value Date/Time   COLORURINE AMBER (A) 05/27/2020 1751   APPEARANCEUR CLOUDY (A) 05/27/2020 1751   APPEARANCEUR Clear 02/02/2015 1148   LABSPEC 1.005 05/27/2020 1751   LABSPEC 1.023 02/08/2014 1851   PHURINE 9.0 (H) 05/27/2020 1751   GLUCOSEU NEGATIVE 05/27/2020 1751   GLUCOSEU Negative 02/08/2014 1851   HGBUR LARGE (A) 05/27/2020 1751   BILIRUBINUR NEGATIVE 05/27/2020 1751   BILIRUBINUR neg 02/16/2020 1151   BILIRUBINUR Negative 02/02/2015 1148   BILIRUBINUR Negative 02/08/2014 1851   KETONESUR 5 (A) 05/27/2020 1751   PROTEINUR NEGATIVE 05/27/2020 1751   UROBILINOGEN 0.2 02/16/2020 1151   NITRITE NEGATIVE 05/27/2020 1751   LEUKOCYTESUR MODERATE (A) 05/27/2020 1751   LEUKOCYTESUR 3+ 02/08/2014 1851   Sepsis Labs Invalid input(s): PROCALCITONIN,  WBC,  LACTICIDVEN Microbiology Recent Results (from the past 240 hour(s))  SARS Coronavirus 2 by RT PCR (hospital order, performed in [place] hospital lab) Nasopharyngeal Nasopharyngeal Swab     Status: None   Collection Time: 09/26/20 12:02 PM   Specimen: Nasopharyngeal Swab  Result Value Ref Range Status   SARS Coronavirus 2 NEGATIVE NEGATIVE Final    Comment: (NOTE) SARS-CoV-2 target nucleic acids are NOT DETECTED.  The SARS-CoV-2 RNA is generally detectable in upper and lower respiratory specimens during the acute phase of infection. The lowest concentration of SARS-CoV-2 viral copies this assay can detect is 250 copies / mL. A negative result does not preclude SARS-CoV-2 infection and should not be used as the sole basis for treatment  or other patient management decisions.  A negative result may occur with improper specimen collection / handling, submission of specimen other than nasopharyngeal swab, presence of viral mutation(s) within the areas targeted by this assay, and inadequate number of viral copies (<250 copies / mL). A negative result must be combined with clinical observations, patient history, and epidemiological information.  Fact Sheet for Patients:   StrictlyIdeas.no  Fact Sheet for Healthcare Providers: BankingDealers.co.za  This test is not yet approved or  cleared by the [place] FDA and has been authorized for detection and/or diagnosis of SARS-CoV-2 by FDA under an Emergency Use Authorization (EUA).  This EUA will remain in effect (meaning this test can be used) for the duration of the COVID-19 declaration under Section 564(b)(1) of the Act, 21 U.S.C. section 360bbb-3(b)(1), unless the authorization is terminated or revoked sooner.  Performed at [facility], [place], [place] [address]   C Difficile Quick Screen w PCR reflex     Status: Abnormal   Collection Time: 09/27/20 10:35 AM   Specimen: STOOL  Result Value Ref Range Status   C Diff antigen POSITIVE (A) NEGATIVE Final   C Diff toxin NEGATIVE NEGATIVE Final   C Diff interpretation Results are indeterminate. See PCR results.  Final    Comment: Performed at [facility], [address] [place], [place] [address]  C. Diff by PCR, Reflexed     Status: Abnormal   Collection Time: 09/27/20 10:35 AM  Result Value  Ref Range Status   Toxigenic C. Difficile by PCR POSITIVE (A) NEGATIVE Final    Comment: Positive for toxigenic C. difficile with little to no toxin production. Only treat if clinical presentation suggests symptomatic illness. Performed at [facility], [address] [place], [place] [address]     Time coordinating discharge: Over  30 minutes  SIGNED:  Lorella Nimrod, MD  [facility] 10/02/2020, 12:25 PM  If 7PM-7AM, please contact night-coverage [email or web address]  This record has been created using Systems analyst. Errors have been sought and corrected,but may not always be located. Such creation errors do not reflect on the standard of care.

## 2020-10-02 NOTE — TOC Progression Note (Signed)
Transition of Care [facility]) - Progression Note    Patient Details  Name: [name] MRN: [id number] Date of Birth: [birth year]  Transition of [facility]) CM/SW [place], LCSW Phone Number: 10/02/2020, 11:04 AM  Clinical Narrative:   PASRR information not uploaded over weekend. Uploaded info to [facility].  Patient has a bed offer at [facility]. CSW spoke with patient who accepted bed offer. Spoke to Admissions Worker Seth Bake. She confirmed patient can come with PASRR pending. Seth Bake has a bed today but patient would have to get her COVID booster before coming. 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 Updated MD and LPN.    Expected Discharge Plan: [place] Barriers to Discharge: Continued Medical Work up  Expected Discharge Plan and Services Expected Discharge Plan: [place] arrangements for the past 2 months: Single Family Home                                       Social Determinants of Health (SDOH) Interventions    Readmission Risk Interventions No flowsheet data found.

## 2020-10-02 NOTE — Progress Notes (Signed)
Physical Therapy Treatment Patient Details Name: [name] MRN: [id number] DOB: 1935/11/03 Today's Date: 10/02/2020    History of Present Illness Pt admitted for subcapital fx of femur secondary to fall on ice and now s/p R ant hip hemi replacement on 1/26. History includes anxiety, HTN, pulm fibrosis (2L of O2 at night), and chronic LBP.    PT Comments    Pt found standing up by sink in attempts to brush teeth.  Phone cord wound around her feet and she had removed O2 - sats 87% on room air.  Assisted pt and instructed her to sit.  Once safe, she is assisted to safely brush teeth, then progress gait 80' in hallway with RW and min guard/supervision.    Educated and discussed at length safety issues.  While mobility is progressing, safety issues remain.  Will benefit from continued mobility and education.  SNF remains appropriate.  Stated she wears O2 at night but not during the day at home.     Follow Up Recommendations  SNF     Equipment Recommendations  None recommended by PT    Recommendations for Other Services       Precautions / Restrictions Precautions Precautions: Anterior Hip;Fall Precaution Booklet Issued: Yes (comment) Restrictions Weight Bearing Restrictions: Yes RLE Weight Bearing: Weight bearing as tolerated    Mobility  Bed Mobility               General bed mobility comments: OOB upon arrival  Transfers Overall transfer level: Needs assistance Equipment used: Rolling walker (2 wheeled) Transfers: Sit to/from Stand Sit to Stand: Min guard            Ambulation/Gait Ambulation/Gait assistance: Min guard Gait Distance (Feet): 80 Feet Assistive device: Rolling walker (2 wheeled) Gait Pattern/deviations: Step-through pattern Gait velocity: decreased       Stairs             Wheelchair Mobility    Modified Rankin (Stroke Patients Only)       Balance Overall balance assessment: Needs assistance Sitting-balance support:  Bilateral upper extremity supported;Feet supported Sitting balance-Leahy Scale: Good Sitting balance - Comments: no LOB sitting EOB with feet support only   Standing balance support: Bilateral upper extremity supported;During functional activity Standing balance-Leahy Scale: Good                              Cognition Arousal/Alertness: Awake/alert Behavior During Therapy: WFL for tasks assessed/performed Overall Cognitive Status: Within Functional Limits for tasks assessed                                 General Comments: A and O x 4 and cooperative and pleasant      Exercises      General Comments        Pertinent Vitals/Pain Pain Assessment: No/denies pain    Home Living                      Prior Function            PT Goals (current goals can now be found in the care plan section) Progress towards PT goals: Progressing toward goals    Frequency    BID      PT Plan Current plan remains appropriate    Co-evaluation  AM-PAC PT "6 Clicks" Mobility   Outcome Measure  Help needed turning from your back to your side while in a flat bed without using bedrails?: A Little Help needed moving from lying on your back to sitting on the side of a flat bed without using bedrails?: A Little Help needed moving to and from a bed to a chair (including a wheelchair)?: A Little Help needed standing up from a chair using your arms (e.g., wheelchair or bedside chair)?: A Little Help needed to walk in hospital room?: A Little Help needed climbing 3-5 steps with a railing? : A Little 6 Click Score: 18    End of Session Equipment Utilized During Treatment: Gait belt;Oxygen (2 L desat to 84% without O2) Activity Tolerance: Patient tolerated treatment well Patient left: in bed;with call bell/phone within reach;with bed alarm set Nurse Communication: Mobility status PT Visit Diagnosis: Muscle weakness (generalized)  (M62.81);Difficulty in walking, not elsewhere classified (R26.2);History of falling (Z91.81);Pain Pain - Right/Left: Right Pain - part of body: Hip     Time: 1093-2355 PT Time Calculation (min) (ACUTE ONLY): 28 min  Charges:  $Gait Training: 8-22 mins $Therapeutic Activity: 8-22 mins                    Chesley Noon, PTA 10/02/20, 1:13 PM

## 2020-10-03 DIAGNOSIS — A0472 Enterocolitis due to Clostridium difficile, not specified as recurrent: Secondary | ICD-10-CM | POA: Diagnosis not present

## 2020-10-03 DIAGNOSIS — J8410 Pulmonary fibrosis, unspecified: Secondary | ICD-10-CM | POA: Diagnosis not present

## 2020-10-03 DIAGNOSIS — F39 Unspecified mood [affective] disorder: Secondary | ICD-10-CM | POA: Diagnosis not present

## 2020-10-03 DIAGNOSIS — S7291XA Unspecified fracture of right femur, initial encounter for closed fracture: Secondary | ICD-10-CM | POA: Diagnosis not present

## 2020-10-04 ENCOUNTER — Encounter: Payer: Self-pay | Admitting: Orthopedic Surgery

## 2020-10-05 ENCOUNTER — Ambulatory Visit: Payer: Medicare Other | Admitting: Primary Care

## 2020-10-06 ENCOUNTER — Telehealth: Payer: Self-pay

## 2020-10-06 NOTE — Telephone Encounter (Signed)
Patient called requesting the dosage for Dicyclomine. She states she is having bad diarrhea and needed to know the name of the medication Dr. Lelon Frohlich had given her. She informed me the nursing home will order the medication for her. Pt verbalized understanding.

## 2020-10-16 DIAGNOSIS — Z9181 History of falling: Secondary | ICD-10-CM | POA: Diagnosis not present

## 2020-10-16 DIAGNOSIS — J84112 Idiopathic pulmonary fibrosis: Secondary | ICD-10-CM | POA: Diagnosis not present

## 2020-10-16 DIAGNOSIS — F419 Anxiety disorder, unspecified: Secondary | ICD-10-CM | POA: Diagnosis not present

## 2020-10-16 DIAGNOSIS — I10 Essential (primary) hypertension: Secondary | ICD-10-CM | POA: Diagnosis not present

## 2020-10-16 DIAGNOSIS — G4730 Sleep apnea, unspecified: Secondary | ICD-10-CM | POA: Diagnosis not present

## 2020-10-16 DIAGNOSIS — S72011D Unspecified intracapsular fracture of right femur, subsequent encounter for closed fracture with routine healing: Secondary | ICD-10-CM | POA: Diagnosis not present

## 2020-10-16 DIAGNOSIS — Z8616 Personal history of COVID-19: Secondary | ICD-10-CM | POA: Diagnosis not present

## 2020-10-16 DIAGNOSIS — G8929 Other chronic pain: Secondary | ICD-10-CM | POA: Diagnosis not present

## 2020-10-16 DIAGNOSIS — M80051D Age-related osteoporosis with current pathological fracture, right femur, subsequent encounter for fracture with routine healing: Secondary | ICD-10-CM | POA: Diagnosis not present

## 2020-10-16 DIAGNOSIS — Z96641 Presence of right artificial hip joint: Secondary | ICD-10-CM | POA: Diagnosis not present

## 2020-10-16 DIAGNOSIS — E7800 Pure hypercholesterolemia, unspecified: Secondary | ICD-10-CM | POA: Diagnosis not present

## 2020-10-17 ENCOUNTER — Other Ambulatory Visit: Payer: Self-pay | Admitting: *Deleted

## 2020-10-17 DIAGNOSIS — J84112 Idiopathic pulmonary fibrosis: Secondary | ICD-10-CM | POA: Diagnosis not present

## 2020-10-17 DIAGNOSIS — F419 Anxiety disorder, unspecified: Secondary | ICD-10-CM | POA: Diagnosis not present

## 2020-10-17 DIAGNOSIS — M80051D Age-related osteoporosis with current pathological fracture, right femur, subsequent encounter for fracture with routine healing: Secondary | ICD-10-CM | POA: Diagnosis not present

## 2020-10-17 DIAGNOSIS — G4730 Sleep apnea, unspecified: Secondary | ICD-10-CM | POA: Diagnosis not present

## 2020-10-17 DIAGNOSIS — I10 Essential (primary) hypertension: Secondary | ICD-10-CM | POA: Diagnosis not present

## 2020-10-17 DIAGNOSIS — G8929 Other chronic pain: Secondary | ICD-10-CM | POA: Diagnosis not present

## 2020-10-17 NOTE — Patient Outreach (Signed)
[place] [facility]) Care Management  10/17/2020  PAISLYN DOMENICO [birth year] [phone]  Initial telephone outreach post SNF stay. Mrs. Lascala had a R femur fracture and was in rehab for one week and came home one week ago. She is receiving home health PT and OT. She resides with her husband and reports she has no care management needs at this time.  Will send her a successful letter and have encouraged her to call me if she feels she would benefit from our services in the future.  Eulah Pont. Myrtie Neither, MSN, [facility] Gerontological Nurse Practitioner [facility] Care Management [phone]

## 2020-10-19 DIAGNOSIS — I10 Essential (primary) hypertension: Secondary | ICD-10-CM | POA: Diagnosis not present

## 2020-10-19 DIAGNOSIS — G4730 Sleep apnea, unspecified: Secondary | ICD-10-CM | POA: Diagnosis not present

## 2020-10-19 DIAGNOSIS — M80051D Age-related osteoporosis with current pathological fracture, right femur, subsequent encounter for fracture with routine healing: Secondary | ICD-10-CM | POA: Diagnosis not present

## 2020-10-19 DIAGNOSIS — G8929 Other chronic pain: Secondary | ICD-10-CM | POA: Diagnosis not present

## 2020-10-19 DIAGNOSIS — J84112 Idiopathic pulmonary fibrosis: Secondary | ICD-10-CM | POA: Diagnosis not present

## 2020-10-19 DIAGNOSIS — F419 Anxiety disorder, unspecified: Secondary | ICD-10-CM | POA: Diagnosis not present

## 2020-10-20 DIAGNOSIS — J84112 Idiopathic pulmonary fibrosis: Secondary | ICD-10-CM | POA: Diagnosis not present

## 2020-10-20 DIAGNOSIS — Z5181 Encounter for therapeutic drug level monitoring: Secondary | ICD-10-CM | POA: Diagnosis not present

## 2020-10-20 DIAGNOSIS — Z79891 Long term (current) use of opiate analgesic: Secondary | ICD-10-CM | POA: Diagnosis not present

## 2020-10-20 DIAGNOSIS — Z79899 Other long term (current) drug therapy: Secondary | ICD-10-CM | POA: Diagnosis not present

## 2020-10-20 DIAGNOSIS — M80051D Age-related osteoporosis with current pathological fracture, right femur, subsequent encounter for fracture with routine healing: Secondary | ICD-10-CM | POA: Diagnosis not present

## 2020-10-20 DIAGNOSIS — M25519 Pain in unspecified shoulder: Secondary | ICD-10-CM | POA: Diagnosis not present

## 2020-10-20 DIAGNOSIS — F419 Anxiety disorder, unspecified: Secondary | ICD-10-CM | POA: Diagnosis not present

## 2020-10-20 DIAGNOSIS — I10 Essential (primary) hypertension: Secondary | ICD-10-CM | POA: Diagnosis not present

## 2020-10-20 DIAGNOSIS — G894 Chronic pain syndrome: Secondary | ICD-10-CM | POA: Diagnosis not present

## 2020-10-20 DIAGNOSIS — G4730 Sleep apnea, unspecified: Secondary | ICD-10-CM | POA: Diagnosis not present

## 2020-10-20 DIAGNOSIS — G8929 Other chronic pain: Secondary | ICD-10-CM | POA: Diagnosis not present

## 2020-10-20 DIAGNOSIS — M25551 Pain in right hip: Secondary | ICD-10-CM | POA: Diagnosis not present

## 2020-10-20 DIAGNOSIS — M5416 Radiculopathy, lumbar region: Secondary | ICD-10-CM | POA: Diagnosis not present

## 2020-10-24 ENCOUNTER — Other Ambulatory Visit: Payer: Self-pay

## 2020-10-24 ENCOUNTER — Ambulatory Visit (INDEPENDENT_AMBULATORY_CARE_PROVIDER_SITE_OTHER): Payer: Medicare Other | Admitting: Primary Care

## 2020-10-24 ENCOUNTER — Encounter: Payer: Self-pay | Admitting: Primary Care

## 2020-10-24 VITALS — BP 122/82 | HR 82 | Temp 97.6°F | Wt 123.5 lb

## 2020-10-24 DIAGNOSIS — G8929 Other chronic pain: Secondary | ICD-10-CM | POA: Diagnosis not present

## 2020-10-24 DIAGNOSIS — Q782 Osteopetrosis: Secondary | ICD-10-CM | POA: Diagnosis not present

## 2020-10-24 DIAGNOSIS — F419 Anxiety disorder, unspecified: Secondary | ICD-10-CM | POA: Diagnosis not present

## 2020-10-24 DIAGNOSIS — E559 Vitamin D deficiency, unspecified: Secondary | ICD-10-CM | POA: Diagnosis not present

## 2020-10-24 DIAGNOSIS — S72011S Unspecified intracapsular fracture of right femur, sequela: Secondary | ICD-10-CM | POA: Diagnosis not present

## 2020-10-24 DIAGNOSIS — G4730 Sleep apnea, unspecified: Secondary | ICD-10-CM | POA: Diagnosis not present

## 2020-10-24 DIAGNOSIS — J84112 Idiopathic pulmonary fibrosis: Secondary | ICD-10-CM | POA: Diagnosis not present

## 2020-10-24 DIAGNOSIS — M80051D Age-related osteoporosis with current pathological fracture, right femur, subsequent encounter for fracture with routine healing: Secondary | ICD-10-CM | POA: Diagnosis not present

## 2020-10-24 DIAGNOSIS — I10 Essential (primary) hypertension: Secondary | ICD-10-CM | POA: Diagnosis not present

## 2020-10-24 LAB — CBC
HCT: 39.3 % (ref 36.0–46.0)
Hemoglobin: 12.8 g/dL (ref 12.0–15.0)
MCHC: 32.6 g/dL (ref 30.0–36.0)
MCV: 94.5 fl (ref 78.0–100.0)
Platelets: 307 10*3/uL (ref 150.0–400.0)
RBC: 4.16 Mil/uL (ref 3.87–5.11)
RDW: 15.3 % (ref 11.5–15.5)
WBC: 7.1 10*3/uL (ref 4.0–10.5)

## 2020-10-24 LAB — BASIC METABOLIC PANEL
BUN: 22 mg/dL (ref 6–23)
CO2: 27 mEq/L (ref 19–32)
Calcium: 10 mg/dL (ref 8.4–10.5)
Chloride: 102 mEq/L (ref 96–112)
Creatinine, Ser: 0.74 mg/dL (ref 0.40–1.20)
GFR: 75.35 mL/min (ref >60.00)
Glucose, Bld: 88 mg/dL (ref 70–99)
Potassium: 4.6 mEq/L (ref 3.5–5.1)
Sodium: 140 mEq/L (ref 135–145)

## 2020-10-24 LAB — VITAMIN D 25 HYDROXY (VIT D DEFICIENCY, FRACTURES): VITD: 37.79 ng/mL (ref 30.00–100.00)

## 2020-10-24 NOTE — Patient Instructions (Signed)
Stop by the lab prior to leaving today. I will notify you of your results once received.   Continue with home health physical therapy.  Follow up with the orthopedic surgeon as scheduled.   Use your cane/walker for stability.  Add back calcium with your vitamin D.  It was a pleasure to see you today!

## 2020-10-24 NOTE — Progress Notes (Signed)
Subjective:    Patient ID: [name], female    DOB: 1936-03-07, 85 y.o.   MRN: [id number]  HPI  This visit occurred during the SARS-CoV-2 public health emergency.  Safety protocols were in place, including screening questions prior to the visit, additional usage of staff PPE, and extensive cleaning of exam room while observing appropriate contact time as indicated for disinfecting solutions.   [name] is a 85 year old female with a history of hypertension, sleep apnea, pulmonary fibrosis, arthritis/osteopenia with compression fracture of L4, right sided femur fracture who presents today for hospital follow up.  She presented to [facility] on 09/27/20 via EMS with acute right hip pain after sustaining a fall onto the ice. Work up in the ED revealed right subcapital femoral neck fracture, she was admitted for further work up.  She underwent hemiarthroplasty on 09/27/20 per orthopedic surgery, no complications during and postoperatively. She was initiated on Fidaxomicin on 09/29/20 for 10 days due to watery diarrhea and history of c-diff, diarrhea improved. She was discharged to [facility] on 10/02/20 for PT rehab, 2 weeks of Lovenox, and TED hose for DVT prevention. Negative pressure dressing to be removed 10/09/20 and replaced by honey comb dressing.   She was discharged home from SNF on 10/10/20. Since she's been home she's continued to notice weakess in her gait, doesn't feel comfortable. She does use her cane. She denies falling since being home. She has been evaluated by home health PT and OT. She saw her general orthopedic provider on 02/18, had final stable removed from her surgical site. She is scheduled to see her orthopedic surgeon in early March 2022.   She is compliant to her Prolia injections semi-annually. She is not taking OTC calcium, she does take vitamin D. She's noticed a mild resting tremor to her hands which is new, also with right anterior thigh pain since surgery.  She denies erythema, drainage, pain to the surgical site.   Wt Readings from Last 3 Encounters:  10/24/20 123 lb 8 oz (56 kg)  09/28/20 144 lb 2.9 oz (65.4 kg)  08/10/20 120 lb (54.4 kg)     Review of Systems  Constitutional: Negative for fever.  Respiratory: Negative for shortness of breath.   Musculoskeletal: Positive for arthralgias and back pain.  Skin: Negative for color change and wound.  Neurological: Positive for weakness.       Past Medical History:  Diagnosis Date  . Altered mental state   . Anxiety   . BMI 30.0-30.9,adult   . Cellulitis   . Chest pain, atypical   . Compression fracture of L4 lumbar vertebra   . Constipation   . Cystitis   . Cystocele   . Diverticulosis   . Fatigue   . Fibrosis, idiopathic pulmonary ([place])   . Gastroenteritis   . History of back surgery   . History of herniated intervertebral disc   . HTN (hypertension)   . Hypercholesteremia   . Hypocalcemia   . Incontinence of urine   . OA (osteoarthritis)    shoulder and back  . Obesity   . Osteopenia   . Pneumonia   . Shortness of breath   . Sleep apnea   . Squamous cell carcinoma of skin 07/16/2016   Right upper lateral eyebrow. SCCis.  . Squamous cell carcinoma of skin 01/13/2018   Left temporal hairline. WD SCC with superficial infiltration.  Ritta Slot   . Vitamin D deficiency      Social  History   Socioeconomic History  . Marital status: Married    Spouse name: [name]  . Number of children: 2  . Years of education: Not on file  . Highest education level: Not on file  Occupational History  . Not on file  Tobacco Use  . Smoking status: Never Smoker  . Smokeless tobacco: Never Used  Vaping Use  . Vaping Use: Never used  Substance and Sexual Activity  . Alcohol use: No  . Drug use: No  . Sexual activity: Never    Birth control/protection: None  Other Topics Concern  . Not on file  Social History Narrative  . Not on file   Social Determinants of Health    Financial Resource Strain: Not on file  Food Insecurity: Not on file  Transportation Needs: Not on file  Physical Activity: Not on file  Stress: Not on file  Social Connections: Not on file  Intimate Partner Violence: Not on file    Past Surgical History:  Procedure Laterality Date  . BACK SURGERY    . CATARACT EXTRACTION W/PHACO Left 04/24/2016   Procedure: CATARACT EXTRACTION PHACO AND INTRAOCULAR LENS PLACEMENT (IOC);  Surgeon: Leandrew Koyanagi, MD;  Location: [place];  Service: Ophthalmology;  Laterality: Left;  . CATARACT EXTRACTION W/PHACO Right 07/14/2017   Procedure: CATARACT EXTRACTION PHACO AND INTRAOCULAR LENS PLACEMENT ([place])  RIGHT;  Surgeon: Leandrew Koyanagi, MD;  Location: [place];  Service: Ophthalmology;  Laterality: Right;  . CHOLECYSTECTOMY    . ESOPHAGOGASTRODUODENOSCOPY (EGD) WITH PROPOFOL N/A 10/13/2018   Procedure: ESOPHAGOGASTRODUODENOSCOPY (EGD) WITH PROPOFOL;  Surgeon: [name], MD;  Location: [facility] ENDOSCOPY;  Service: Gastroenterology;  Laterality: N/A;  . FLEXIBLE SIGMOIDOSCOPY N/A 10/13/2018   Procedure: FLEXIBLE SIGMOIDOSCOPY;  Surgeon: [name], MD;  Location: [facility] ENDOSCOPY;  Service: Gastroenterology;  Laterality: N/A;  . FLEXIBLE SIGMOIDOSCOPY N/A 09/09/2019   Procedure: FLEXIBLE SIGMOIDOSCOPY;  Surgeon: [name], MD;  Location: [facility] ENDOSCOPY;  Service: Gastroenterology;  Laterality: N/A;  . FLEXIBLE SIGMOIDOSCOPY N/A 08/04/2020   Procedure: FLEXIBLE SIGMOIDOSCOPY;  Surgeon: [name], MD;  Location: [facility] ENDOSCOPY;  Service: Gastroenterology;  Laterality: N/A;  Per Dr. Vicente Males, unsedated  . HIP ARTHROPLASTY Right 09/27/2020   Procedure: ARTHROPLASTY BIPOLAR HIP (HEMIARTHROPLASTY);  Surgeon: Hessie Knows, MD;  Location: [facility] ORS;  Service: Orthopedics;  Laterality: Right;  . ROTATOR CUFF REPAIR Bilateral    left-12/2009; right-03/2009 dr.califf  . TONSILLECTOMY    . TOTAL SHOULDER REPLACEMENT Left     Family History   Problem Relation Age of Onset  . COPD Mother   . Cancer Brother        colon    No Known Allergies  Current Outpatient Medications on File Prior to Visit  Medication Sig Dispense Refill  . Cholecalciferol (VITAMIN D3) 125 MCG (5000 UT) CAPS Take 5,000 Int'l Units by mouth.    . citalopram (CELEXA) 40 MG tablet TAKE 1 TABLET (40 MG TOTAL) BY MOUTH DAILY. FOR ANXIETY. 90 tablet 1  . denosumab (PROLIA) 60 MG/ML SOSY injection Inject 60 mg into the skin every 6 (six) months.    . dicyclomine (BENTYL) 10 MG capsule Take 1 capsule (10 mg total) by mouth 3 (three) times daily as needed for spasms. 30 capsule 0  . ESBRIET 267 MG TABS Take 267 mg by mouth 3 (three) times daily with meals.    Marland Kitchen HYDROcodone-acetaminophen (NORCO/VICODIN) 5-325 MG tablet Take 1 tablet by mouth every 6 (six) hours as needed for moderate pain. 30 tablet 0  .  OXYGEN Inhale into the lungs. 2 L at night    . sulfaSALAzine (AZULFIDINE) 500 MG tablet Take 1 tablet (500 mg total) by mouth 2 (two) times daily. 180 tablet 1  . vitamin B-12 (CYANOCOBALAMIN) 1000 MCG tablet Take 1,000 mcg by mouth every other day.    . VOLTAREN 1 % GEL Apply 2 g topically in the morning, at noon, and at bedtime. Shoulders and back  2  . Multiple Vitamin (MULTIVITAMIN WITH MINERALS) TABS tablet Take 1 tablet by mouth daily. (Patient not taking: Reported on 10/24/2020)     No current facility-administered medications on file prior to visit.    BP 122/82 (BP Location: Left Arm, Patient Position: Sitting, Cuff Size: Normal)   Pulse 82   Temp 97.6 F (36.4 C) (Temporal)   Wt 123 lb 8 oz (56 kg)   SpO2 94%   BMI 24.12 kg/m    Objective:   Physical Exam Constitutional:      Appearance: She is well-nourished.  Cardiovascular:     Rate and Rhythm: Normal rate and regular rhythm.  Pulmonary:     Effort: Pulmonary effort is normal.     Breath sounds: Normal breath sounds.  Musculoskeletal:     Cervical back: Neck supple.     Comments:  Ambulating well with cane in clinic.  Skin:    General: Skin is warm and dry.     Findings: No erythema.     Comments: Surgical site appears to have healed well, warmth, drainage, or erythema.   Psychiatric:        Mood and Affect: Mood and affect normal.            Assessment & Plan:

## 2020-10-24 NOTE — Assessment & Plan Note (Signed)
S/P right hemiarthroplasty since 09/27/20, surgical site today appears well.  Continue with home health PT. Encouraged use of cane/walker for stability. Repeat labs pending today including CBC, BMP. Add vitamin D.  Continue Prolia injections, discussed to add calcium to vitamin D. Follow up with orthopedic surgeon as scheduled.  Hospital labs, notes, imaging reviewed.

## 2020-10-25 ENCOUNTER — Telehealth (INDEPENDENT_AMBULATORY_CARE_PROVIDER_SITE_OTHER): Payer: Medicare Other | Admitting: Gastroenterology

## 2020-10-25 DIAGNOSIS — I10 Essential (primary) hypertension: Secondary | ICD-10-CM | POA: Diagnosis not present

## 2020-10-25 DIAGNOSIS — E7800 Pure hypercholesterolemia, unspecified: Secondary | ICD-10-CM | POA: Diagnosis not present

## 2020-10-25 DIAGNOSIS — G4730 Sleep apnea, unspecified: Secondary | ICD-10-CM | POA: Diagnosis not present

## 2020-10-25 DIAGNOSIS — R1013 Epigastric pain: Secondary | ICD-10-CM | POA: Diagnosis not present

## 2020-10-25 DIAGNOSIS — J84112 Idiopathic pulmonary fibrosis: Secondary | ICD-10-CM | POA: Diagnosis not present

## 2020-10-25 DIAGNOSIS — K5190 Ulcerative colitis, unspecified, without complications: Secondary | ICD-10-CM

## 2020-10-25 DIAGNOSIS — Z96641 Presence of right artificial hip joint: Secondary | ICD-10-CM | POA: Diagnosis not present

## 2020-10-25 DIAGNOSIS — M80051D Age-related osteoporosis with current pathological fracture, right femur, subsequent encounter for fracture with routine healing: Secondary | ICD-10-CM | POA: Diagnosis not present

## 2020-10-25 DIAGNOSIS — S72011D Unspecified intracapsular fracture of right femur, subsequent encounter for closed fracture with routine healing: Secondary | ICD-10-CM | POA: Diagnosis not present

## 2020-10-25 DIAGNOSIS — Z9181 History of falling: Secondary | ICD-10-CM | POA: Diagnosis not present

## 2020-10-25 DIAGNOSIS — F419 Anxiety disorder, unspecified: Secondary | ICD-10-CM | POA: Diagnosis not present

## 2020-10-25 DIAGNOSIS — G8929 Other chronic pain: Secondary | ICD-10-CM | POA: Diagnosis not present

## 2020-10-25 DIAGNOSIS — Z8616 Personal history of COVID-19: Secondary | ICD-10-CM | POA: Diagnosis not present

## 2020-10-25 NOTE — Progress Notes (Signed)
[name] , MD [address]  [place]  [place], [place] [address]  Main: [phone]  Fax: [phone]   Primary Care Physician: [name], NP  Virtual Visit via Telephone Note  I connected with patient on 10/25/20 at  9:30 AM EST by telephone and verified that I am speaking with the correct person using two identifiers.   I discussed the limitations, risks, security and privacy concerns of performing an evaluation and management service by telephone and the availability of in person appointments. I also discussed with the patient that there may be a patient responsible charge related to this service. The patient expressed understanding and agreed to proceed.  Location of Patient: Home Location of Provider: Home Persons involved: Patient and provider only   History of Present Illness:   Follow-up for ulcerative colitis and C. difficile diarrhea  HPI: [name] is a 85 y.o. female   Summary of history :  She was previously seen in 2020diarrhea after being referred by her primary care physician in January 2020. At that point of time she has been having diarrhea for over 2 months She was seen in 2018 by [facility] clinic GI in12/2009she had a colonoscopy as per the last GI note showed chronic colitis with focal cryptitis. Focal active colitis. Not sure why it was not felt that this patient had inflammatory bowel disease. At her initial visit she had beenon Meloxicam for about 2-3 Months, 2 tablets a day and no other NSAID's. She also did have blood in her stool at that point of time.  10/13/2018: EGD: normal . SigmoidoscopyDiscontinuous areas of nonbleeding ulcerated mucosa with no stigmata of recent bleeding were present in the sigmoid colon, in thedescending colon and in the distal transverse colon. Biopsies were taken with a cold forceps for histology.Rectum was normal. Pathology report showed chronic active colitis and normal rectal  biopsies. HSV and CMV negative.  In February 2021 when she came to see me she hadstoppedmeloxicam and doing well.Was not having any diarrhea.  09/09/2019: Sigmoidoscopy: I went all the way to her mid transverse colon. No evidence of colitis was seen. There was a single solitary ulcer in the proximal descending colon. No bleeding was seen. Biopsies were taken with forceps. And it demonstrated chronic colitis with mild to moderate activity and small crypt abscess. 09/13/2019: Vitamin D normal, hemoglobin 13.7 with a platelet count of 224 and a white cell count of 9.8. CMP was normal.  06/02/2020 CRP 0.7, CMP showed a calcium of 8.7 normal AST and ALT. Hemoglobin on 06/01/2020 showed a normal study with normal differential count  She hadbeen commenced on vancomycinon 07/03/2020 for C. difficile diarrhea. She called our office on 07/11/2020 informed that the diarrhea was recurring when she went off the vancomycin. I subsequently suggested her to increase her dose back to 4 times a day for 3 weeks with a plan to gradually taper off.  08/01/2020: CRP less than 1, CMP normal, hemoglobin 12.4 g 08/04/2020: Flexible sigmoidoscopy: The mucosa of the entire examined colon appeared normal diverticulosis noted. Biopsies taken. Showed focal mild active colitis in the left colon and the rectum showed no active colitis.  Interval history  09/12/2020-10/25/2020   The diarrhea has resolved. No change of weight. Taking 2 tablets of sulfasalazine. She has 2 formed bowel movements, no blood.  No other GI symptoms.  Denies any abdominal pain. CBC and CMP from 10/24/2020 normal.  She recently had a hip fracture and was in hospital  and subsequently in rehab.  Current Outpatient Medications  Medication Sig Dispense Refill  . Cholecalciferol (VITAMIN D3) 125 MCG (5000 UT) CAPS Take 5,000 Int'l Units by mouth.    . citalopram (CELEXA) 40 MG tablet TAKE 1 TABLET (40 MG TOTAL) BY MOUTH DAILY. FOR  ANXIETY. 90 tablet 1  . denosumab (PROLIA) 60 MG/ML SOSY injection Inject 60 mg into the skin every 6 (six) months.    . dicyclomine (BENTYL) 10 MG capsule Take 1 capsule (10 mg total) by mouth 3 (three) times daily as needed for spasms. 30 capsule 0  . ESBRIET 267 MG TABS Take 267 mg by mouth 3 (three) times daily with meals.    Marland Kitchen HYDROcodone-acetaminophen (NORCO/VICODIN) 5-325 MG tablet Take 1 tablet by mouth every 6 (six) hours as needed for moderate pain. 30 tablet 0  . Multiple Vitamin (MULTIVITAMIN WITH MINERALS) TABS tablet Take 1 tablet by mouth daily. (Patient not taking: Reported on 10/24/2020)    . OXYGEN Inhale into the lungs. 2 L at night    . sulfaSALAzine (AZULFIDINE) 500 MG tablet Take 1 tablet (500 mg total) by mouth 2 (two) times daily. 180 tablet 1  . vitamin B-12 (CYANOCOBALAMIN) 1000 MCG tablet Take 1,000 mcg by mouth every other day.    . VOLTAREN 1 % GEL Apply 2 g topically in the morning, at noon, and at bedtime. Shoulders and back  2   No current facility-administered medications for this visit.    Allergies as of 10/25/2020  . (No Known Allergies)    Review of Systems:    All systems reviewed and negative except where noted in HPI.   Observations/Objective:  Labs: CMP     Component Value Date/Time   NA 140 10/24/2020 1141   NA 139 09/21/2020 1320   NA 139 02/09/2014 0500   K 4.6 10/24/2020 1141   K 3.2 (L) 02/09/2014 0500   CL 102 10/24/2020 1141   CL 106 02/09/2014 0500   CO2 27 10/24/2020 1141   CO2 27 02/09/2014 0500   GLUCOSE 88 10/24/2020 1141   GLUCOSE 85 02/09/2014 0500   BUN 22 10/24/2020 1141   BUN 19 09/21/2020 1320   BUN 11 02/09/2014 0500   CREATININE 0.74 10/24/2020 1141   CREATININE 0.78 02/09/2014 0500   CALCIUM 10.0 10/24/2020 1141   CALCIUM 8.8 02/09/2014 0500   PROT 7.0 09/26/2020 1029   PROT 6.7 08/01/2020 1419   PROT 6.7 02/08/2014 1247   ALBUMIN 3.6 09/26/2020 1029   ALBUMIN 3.9 08/01/2020 1419   ALBUMIN 3.2 (L)  02/08/2014 1247   AST 31 09/26/2020 1029   AST 35 02/08/2014 1247   ALT 18 09/26/2020 1029   ALT 23 02/08/2014 1247   ALKPHOS 58 09/26/2020 1029   ALKPHOS 101 02/08/2014 1247   BILITOT 0.8 09/26/2020 1029   BILITOT 0.4 08/01/2020 1419   BILITOT 0.7 02/08/2014 1247   GFRNONAA >60 09/29/2020 0351   GFRNONAA >60 02/09/2014 0500   GFRAA 76 09/21/2020 1320   GFRAA >60 02/09/2014 0500   Lab Results  Component Value Date   WBC 7.1 10/24/2020   HGB 12.8 10/24/2020   HCT 39.3 10/24/2020   MCV 94.5 10/24/2020   PLT 307.0 10/24/2020    Imaging Studies: DG Chest 1 View  Result Date: 09/26/2020 CLINICAL DATA:  Fall.  Leg pain. EXAM: CHEST  1 VIEW COMPARISON:  08/15/2020 FINDINGS: Midline trachea. Mild cardiomegaly, accentuated by low lung volumes and AP portable technique. Basilar predominant subpleural interstitial thickening. Similar.  No lobar consolidation. Osteopenia. Left shoulder arthroplasty. Thoracolumbar vertebral augmentation. IMPRESSION: No acute cardiopulmonary disease. Peripheral and basilar predominant interstitial thickening, suspicious for interstitial lung disease. Please see 08/03/2019 chest CT report. Electronically Signed   By: Abigail Miyamoto M.D.   On: 09/26/2020 11:28   CT Head Wo Contrast  Result Date: 09/26/2020 CLINICAL DATA:  RIGHT hip pain, fall without reported loss of consciousness, reportedly hit LEFT side of head. EXAM: CT HEAD WITHOUT CONTRAST TECHNIQUE: Contiguous axial images were obtained from the base of the skull through the vertex without intravenous contrast. COMPARISON:  Prior exam from May 29, 2020 FINDINGS: Brain: No evidence of acute infarction, hemorrhage, hydrocephalus, extra-axial collection or mass lesion/mass effect. Signs of atrophy and mild chronic microvascular ischemic change as before. Vascular: No hyperdense vessel or unexpected calcification. Skull: Normal. Negative for fracture or focal lesion. Sinuses/Orbits: Visualized paranasal sinuses  and orbits are unremarkable. Other: None. IMPRESSION: 1. No acute intracranial pathology. 2. Signs of atrophy and mild chronic microvascular ischemic change as before. Electronically Signed   By: Zetta Bills M.D.   On: 09/26/2020 11:45   DG HIP OPERATIVE UNILAT W OR W/O PELVIS RIGHT  Result Date: 09/27/2020 CLINICAL DATA:  Right hip operative EXAM: OPERATIVE right HIP (WITH PELVIS IF PERFORMED) 4 VIEWS TECHNIQUE: Fluoroscopic spot image(s) were submitted for interpretation post-operatively. COMPARISON:  09/26/2020 FINDINGS: Changes of right hip replacement. Normal AP alignment. No visible hardware complicating feature. IMPRESSION: Right hip replacement.  No visible complicating feature. Electronically Signed   By: Rolm Baptise M.D.   On: 09/27/2020 17:27   DG Femur Min 2 Views Right  Result Date: 09/26/2020 CLINICAL DATA:  Recent fall with hip pain, initial encounter EXAM: RIGHT FEMUR 2 VIEWS COMPARISON:  None. FINDINGS: Subcapital femoral neck fracture is noted with mild impaction at the fracture site. No other focal abnormality is seen. IMPRESSION: Subcapital femoral neck fracture. Electronically Signed   By: Inez Catalina M.D.   On: 09/26/2020 11:27    Assessment and Plan:   ALISHBA NAPLES is a 85 y.o. y/o female  here to followfor diarrhea, completed treatment for C. difficile colitis with a gradual taper of vancomycin.  She has a history of ulcerative colitis and has been doing well on sulfasalazine with no other GI symptoms.  Plan 1.  Continue sulfasalazine 2 tablets a day 2. Stop Bentyl  Phone visit in 4 months    I discussed the assessment and treatment plan with the patient. The patient was provided an opportunity to ask questions and all were answered. The patient agreed with the plan and demonstrated an understanding of the instructions.   The patient was advised to call back or seek an in-person evaluation if the symptoms worsen or if the condition fails to improve as  anticipated.  I provided 15 minutes of non-face-to-face time during this encounter.  Dr Terri Bellows MD,MRCP [facility]) Gastroenterology/Hepatology Pager: [phone]   Speech recognition software was used to dictate this note.

## 2020-10-26 DIAGNOSIS — F419 Anxiety disorder, unspecified: Secondary | ICD-10-CM | POA: Diagnosis not present

## 2020-10-26 DIAGNOSIS — G8929 Other chronic pain: Secondary | ICD-10-CM | POA: Diagnosis not present

## 2020-10-26 DIAGNOSIS — J84112 Idiopathic pulmonary fibrosis: Secondary | ICD-10-CM | POA: Diagnosis not present

## 2020-10-26 DIAGNOSIS — Z961 Presence of intraocular lens: Secondary | ICD-10-CM | POA: Diagnosis not present

## 2020-10-26 DIAGNOSIS — M80051D Age-related osteoporosis with current pathological fracture, right femur, subsequent encounter for fracture with routine healing: Secondary | ICD-10-CM | POA: Diagnosis not present

## 2020-10-26 DIAGNOSIS — I10 Essential (primary) hypertension: Secondary | ICD-10-CM | POA: Diagnosis not present

## 2020-10-26 DIAGNOSIS — G4730 Sleep apnea, unspecified: Secondary | ICD-10-CM | POA: Diagnosis not present

## 2020-10-27 DIAGNOSIS — M80051D Age-related osteoporosis with current pathological fracture, right femur, subsequent encounter for fracture with routine healing: Secondary | ICD-10-CM | POA: Diagnosis not present

## 2020-10-27 DIAGNOSIS — G4730 Sleep apnea, unspecified: Secondary | ICD-10-CM | POA: Diagnosis not present

## 2020-10-27 DIAGNOSIS — J84112 Idiopathic pulmonary fibrosis: Secondary | ICD-10-CM | POA: Diagnosis not present

## 2020-10-27 DIAGNOSIS — F419 Anxiety disorder, unspecified: Secondary | ICD-10-CM | POA: Diagnosis not present

## 2020-10-27 DIAGNOSIS — I10 Essential (primary) hypertension: Secondary | ICD-10-CM | POA: Diagnosis not present

## 2020-10-27 DIAGNOSIS — G8929 Other chronic pain: Secondary | ICD-10-CM | POA: Diagnosis not present

## 2020-11-01 DIAGNOSIS — F419 Anxiety disorder, unspecified: Secondary | ICD-10-CM | POA: Diagnosis not present

## 2020-11-01 DIAGNOSIS — J84112 Idiopathic pulmonary fibrosis: Secondary | ICD-10-CM | POA: Diagnosis not present

## 2020-11-01 DIAGNOSIS — G4730 Sleep apnea, unspecified: Secondary | ICD-10-CM | POA: Diagnosis not present

## 2020-11-01 DIAGNOSIS — I10 Essential (primary) hypertension: Secondary | ICD-10-CM | POA: Diagnosis not present

## 2020-11-01 DIAGNOSIS — G8929 Other chronic pain: Secondary | ICD-10-CM | POA: Diagnosis not present

## 2020-11-01 DIAGNOSIS — M80051D Age-related osteoporosis with current pathological fracture, right femur, subsequent encounter for fracture with routine healing: Secondary | ICD-10-CM | POA: Diagnosis not present

## 2020-11-06 DIAGNOSIS — M818 Other osteoporosis without current pathological fracture: Secondary | ICD-10-CM | POA: Diagnosis not present

## 2020-11-08 DIAGNOSIS — Z8781 Personal history of (healed) traumatic fracture: Secondary | ICD-10-CM | POA: Diagnosis not present

## 2020-11-08 DIAGNOSIS — J84112 Idiopathic pulmonary fibrosis: Secondary | ICD-10-CM | POA: Diagnosis not present

## 2020-11-08 DIAGNOSIS — G4730 Sleep apnea, unspecified: Secondary | ICD-10-CM | POA: Diagnosis not present

## 2020-11-08 DIAGNOSIS — I10 Essential (primary) hypertension: Secondary | ICD-10-CM | POA: Diagnosis not present

## 2020-11-08 DIAGNOSIS — F419 Anxiety disorder, unspecified: Secondary | ICD-10-CM | POA: Diagnosis not present

## 2020-11-08 DIAGNOSIS — M25551 Pain in right hip: Secondary | ICD-10-CM | POA: Diagnosis not present

## 2020-11-08 DIAGNOSIS — M80051D Age-related osteoporosis with current pathological fracture, right femur, subsequent encounter for fracture with routine healing: Secondary | ICD-10-CM | POA: Diagnosis not present

## 2020-11-08 DIAGNOSIS — G8929 Other chronic pain: Secondary | ICD-10-CM | POA: Diagnosis not present

## 2020-11-08 DIAGNOSIS — Z9889 Other specified postprocedural states: Secondary | ICD-10-CM | POA: Diagnosis not present

## 2020-11-10 DIAGNOSIS — I10 Essential (primary) hypertension: Secondary | ICD-10-CM | POA: Diagnosis not present

## 2020-11-10 DIAGNOSIS — M80051D Age-related osteoporosis with current pathological fracture, right femur, subsequent encounter for fracture with routine healing: Secondary | ICD-10-CM | POA: Diagnosis not present

## 2020-11-10 DIAGNOSIS — F419 Anxiety disorder, unspecified: Secondary | ICD-10-CM | POA: Diagnosis not present

## 2020-11-10 DIAGNOSIS — J84112 Idiopathic pulmonary fibrosis: Secondary | ICD-10-CM | POA: Diagnosis not present

## 2020-11-10 DIAGNOSIS — G8929 Other chronic pain: Secondary | ICD-10-CM | POA: Diagnosis not present

## 2020-11-10 DIAGNOSIS — G4730 Sleep apnea, unspecified: Secondary | ICD-10-CM | POA: Diagnosis not present

## 2020-11-14 DIAGNOSIS — M5136 Other intervertebral disc degeneration, lumbar region: Secondary | ICD-10-CM | POA: Diagnosis not present

## 2020-11-14 DIAGNOSIS — M5416 Radiculopathy, lumbar region: Secondary | ICD-10-CM | POA: Diagnosis not present

## 2020-11-14 DIAGNOSIS — M48062 Spinal stenosis, lumbar region with neurogenic claudication: Secondary | ICD-10-CM | POA: Diagnosis not present

## 2020-11-20 ENCOUNTER — Telehealth: Payer: Self-pay | Admitting: Gastroenterology

## 2020-11-20 NOTE — Telephone Encounter (Signed)
Patient called to ask if Dr Vicente Males still wants her to take sulfaSALAzine

## 2020-11-21 NOTE — Telephone Encounter (Signed)
Spoke with pt and explained that Dr. Vicente Males recommends continuing the sulfasalazine. Pt agrees.

## 2020-11-27 DIAGNOSIS — M25561 Pain in right knee: Secondary | ICD-10-CM | POA: Diagnosis not present

## 2020-11-27 DIAGNOSIS — Z96641 Presence of right artificial hip joint: Secondary | ICD-10-CM | POA: Diagnosis not present

## 2020-11-27 DIAGNOSIS — M545 Low back pain, unspecified: Secondary | ICD-10-CM | POA: Diagnosis not present

## 2020-11-27 DIAGNOSIS — M48061 Spinal stenosis, lumbar region without neurogenic claudication: Secondary | ICD-10-CM | POA: Diagnosis not present

## 2020-11-27 DIAGNOSIS — M1711 Unilateral primary osteoarthritis, right knee: Secondary | ICD-10-CM | POA: Diagnosis not present

## 2020-11-29 ENCOUNTER — Other Ambulatory Visit: Payer: Self-pay | Admitting: Orthopedic Surgery

## 2020-11-29 DIAGNOSIS — M5441 Lumbago with sciatica, right side: Secondary | ICD-10-CM

## 2020-12-06 ENCOUNTER — Other Ambulatory Visit: Payer: Self-pay

## 2020-12-06 ENCOUNTER — Ambulatory Visit
Admission: RE | Admit: 2020-12-06 | Discharge: 2020-12-06 | Disposition: A | Discharge status: Home / Self Care | Payer: Medicare Other | Source: Ambulatory Visit | Attending: Orthopedic Surgery | Admitting: Orthopedic Surgery

## 2020-12-06 DIAGNOSIS — M545 Low back pain, unspecified: Secondary | ICD-10-CM | POA: Diagnosis not present

## 2020-12-06 DIAGNOSIS — M5441 Lumbago with sciatica, right side: Secondary | ICD-10-CM | POA: Insufficient documentation

## 2020-12-08 DIAGNOSIS — M5136 Other intervertebral disc degeneration, lumbar region: Secondary | ICD-10-CM | POA: Diagnosis not present

## 2020-12-08 DIAGNOSIS — M48062 Spinal stenosis, lumbar region with neurogenic claudication: Secondary | ICD-10-CM | POA: Diagnosis not present

## 2020-12-08 DIAGNOSIS — M5416 Radiculopathy, lumbar region: Secondary | ICD-10-CM | POA: Diagnosis not present

## 2020-12-15 DIAGNOSIS — M25519 Pain in unspecified shoulder: Secondary | ICD-10-CM | POA: Diagnosis not present

## 2020-12-15 DIAGNOSIS — G894 Chronic pain syndrome: Secondary | ICD-10-CM | POA: Diagnosis not present

## 2020-12-15 DIAGNOSIS — Z79899 Other long term (current) drug therapy: Secondary | ICD-10-CM | POA: Diagnosis not present

## 2020-12-15 DIAGNOSIS — M5416 Radiculopathy, lumbar region: Secondary | ICD-10-CM | POA: Diagnosis not present

## 2020-12-15 DIAGNOSIS — M25551 Pain in right hip: Secondary | ICD-10-CM | POA: Diagnosis not present

## 2020-12-19 ENCOUNTER — Telehealth: Payer: Self-pay | Admitting: Primary Care

## 2020-12-19 DIAGNOSIS — M13872 Other specified arthritis, left ankle and foot: Secondary | ICD-10-CM | POA: Diagnosis not present

## 2020-12-19 DIAGNOSIS — M13879 Other specified arthritis, unspecified ankle and foot: Secondary | ICD-10-CM | POA: Diagnosis not present

## 2020-12-19 NOTE — Telephone Encounter (Signed)
Noted, will evaluate. 

## 2020-12-19 NOTE — Telephone Encounter (Signed)
Called patient I have set up for virtual tomorrow afternoon. She will check and make sure her husband will be home at that time and let me know. If not she will not be able to do virtual and we will need to make a change.

## 2020-12-19 NOTE — Telephone Encounter (Signed)
Pt c/o coughing spasms x 4-5 times per day, some at night. Cough is mostly dry, some production at times. Denies SOB, chest discomfort. No exp to covid x 2 weeks. Pt states that she has been doing this for about 3 weeks. Using OTC cough meds and lozenges.   Please advise, thanks.   No appts until Friday, patient does not want to wait that long. Cough is steady.

## 2020-12-20 ENCOUNTER — Telehealth: Payer: Medicare Other | Admitting: Primary Care

## 2020-12-20 ENCOUNTER — Other Ambulatory Visit: Payer: Self-pay

## 2020-12-20 ENCOUNTER — Telehealth (INDEPENDENT_AMBULATORY_CARE_PROVIDER_SITE_OTHER): Payer: Medicare Other | Admitting: Primary Care

## 2020-12-20 ENCOUNTER — Encounter: Payer: Self-pay | Admitting: Primary Care

## 2020-12-20 VITALS — Ht 60.0 in | Wt 123.0 lb

## 2020-12-20 DIAGNOSIS — R059 Cough, unspecified: Secondary | ICD-10-CM | POA: Diagnosis not present

## 2020-12-20 DIAGNOSIS — R053 Chronic cough: Secondary | ICD-10-CM | POA: Insufficient documentation

## 2020-12-20 MED ORDER — DOXYCYCLINE HYCLATE 100 MG PO TABS: 100.0000 mg | ORAL_TABLET | Freq: Two times a day (BID) | ORAL | 0 refills | Status: DC

## 2020-12-20 NOTE — Patient Instructions (Signed)
Start Doxycycline antibiotic for the infection. Take 1 tablet by mouth twice daily for 7 days.  You may take the Tessalon Perles up to three times daily as needed for cough.   Please update me if no improvement.   It was a pleasure to see you today! [name], NP-C

## 2020-12-20 NOTE — Assessment & Plan Note (Signed)
Acute for the last three weeks, no improvement.   Given history of IPF coupled with duration of symptoms and age, will treat with Doxycycline 100 mg BID x 7 days.   Continue Tessalon Perles. She will update if no improvement.

## 2020-12-20 NOTE — Progress Notes (Signed)
Patient ID: [name], female    DOB: [birth year], 84 y.o.   MRN: [id number]  Virtual visit completed through [place], a video enabled telemedicine application. Due to national recommendations of social distancing due to COVID-19, a virtual visit is felt to be most appropriate for this patient at this time. Reviewed limitations, risks, security and privacy concerns of performing a virtual visit and the availability of in person appointments. I also reviewed that there may be a patient responsible charge related to this service. The patient agreed to proceed.   We attempted to connect via phone but she could not enable her camera, we conducted her visit via phone which lasted 9 min and 48 sec.  Patient location: home Provider location: [place] [facility], office Persons participating in this virtual visit: patient, provider   If any vitals were documented, they were collected by patient at home unless specified below.    Ht 5' (1.524 m)   Wt 123 lb (55.8 kg)   BMI 24.02 kg/m    CC: Cough Subjective:   HPI: [name] is a 85 y.o. female with a history of pulmonary fibrosis, sleep apnea, hypertension, Covid-19 infection, presenting on 12/20/2020 with a chief complaint of cough.  Her cough began about three weeks ago. Occurs intermittently, will expereince coughing spells that cause headaches. She's completed three Covid-19 vaccines.  She denies fevers, chills, otalgia, sore throat. She has an appointment with pulmonology scheduled for a few weeks. Overall her symptoms are about the same, is feeling more tired. She's taken Tussin DM once without much improvement.   She's feeling "awful" since she had right hip surgery, continues to have numbness to her right lower extremity and pain to her lower back.      Relevant past medical, surgical, family and social history reviewed and updated as indicated. Interim medical history since our last visit reviewed. Allergies  and medications reviewed and updated. Outpatient Medications Prior to Visit  Medication Sig Dispense Refill  . Cholecalciferol (VITAMIN D3) 125 MCG (5000 UT) CAPS Take 5,000 Int'l Units by mouth.    . citalopram (CELEXA) 40 MG tablet TAKE 1 TABLET (40 MG TOTAL) BY MOUTH DAILY. FOR ANXIETY. 90 tablet 1  . denosumab (PROLIA) 60 MG/ML SOSY injection Inject 60 mg into the skin every 6 (six) months.    . ESBRIET 267 MG TABS Take 267 mg by mouth 3 (three) times daily with meals.    Marland Kitchen HYDROcodone-acetaminophen (NORCO) 7.5-325 MG tablet Take 1 tablet by mouth daily as needed.    . Multiple Vitamin (MULTIVITAMIN WITH MINERALS) TABS tablet Take 1 tablet by mouth daily.    . OXYGEN Inhale into the lungs. 2 L at night    . sulfaSALAzine (AZULFIDINE) 500 MG tablet Take 1 tablet (500 mg total) by mouth 2 (two) times daily. 180 tablet 1  . vitamin B-12 (CYANOCOBALAMIN) 1000 MCG tablet Take 1,000 mcg by mouth every other day.    . VOLTAREN 1 % GEL Apply 2 g topically in the morning, at noon, and at bedtime. Shoulders and back  2  . HYDROcodone-acetaminophen (NORCO/VICODIN) 5-325 MG tablet Take 1 tablet by mouth every 6 (six) hours as needed for moderate pain. 30 tablet 0   No facility-administered medications prior to visit.     Per HPI unless specifically indicated in ROS section below Review of Systems  Constitutional: Positive for fatigue. Negative for chills and fever.  HENT: Negative for congestion, postnasal drip and sore throat.  Respiratory: Positive for cough and shortness of breath.    Objective:  Ht 5' (1.524 m)   Wt 123 lb (55.8 kg)   BMI 24.02 kg/m   Wt Readings from Last 3 Encounters:  12/20/20 123 lb (55.8 kg)  10/24/20 123 lb 8 oz (56 kg)  09/28/20 144 lb 2.9 oz (65.4 kg)       Physical exam: Gen: alert, no cough during visit with me but she did go into a coughing spell with the CMA Pulm: speaks in complete sentences without increased work of breathing Psych: normal mood,  normal thought content      Results for orders placed or performed in visit on 25/05/39  Basic metabolic panel  Result Value Ref Range   Sodium 140 135 - 145 mEq/L   Potassium 4.6 3.5 - 5.1 mEq/L   Chloride 102 96 - 112 mEq/L   CO2 27 19 - 32 mEq/L   Glucose, Bld 88 70 - 99 mg/dL   BUN 22 6 - 23 mg/dL   Creatinine, Ser 0.74 0.40 - 1.20 mg/dL   GFR 75.35 >60.00 mL/min   Calcium 10.0 8.4 - 10.5 mg/dL  CBC  Result Value Ref Range   WBC 7.1 4.0 - 10.5 K/uL   RBC 4.16 3.87 - 5.11 Mil/uL   Platelets 307.0 150.0 - 400.0 K/uL   Hemoglobin 12.8 12.0 - 15.0 g/dL   HCT 39.3 36.0 - 46.0 %   MCV 94.5 78.0 - 100.0 fl   MCHC 32.6 30.0 - 36.0 g/dL   RDW 15.3 11.5 - 15.5 %  VITAMIN D 25 Hydroxy (Vit-D Deficiency, Fractures)  Result Value Ref Range   VITD 37.79 30.00 - 100.00 ng/mL   Assessment & Plan:   Problem List Items Addressed This Visit      Other   Cough - Primary    Acute for the last three weeks, no improvement.   Given history of IPF coupled with duration of symptoms and age, will treat with Doxycycline 100 mg BID x 7 days.   Continue Tessalon Perles. She will update if no improvement.       Relevant Medications   doxycycline (VIBRA-TABS) 100 MG tablet       Meds ordered this encounter  Medications  . doxycycline (VIBRA-TABS) 100 MG tablet    Sig: Take 1 tablet (100 mg total) by mouth 2 (two) times daily.    Dispense:  14 tablet    Refill:  0    Order Specific Question:   Supervising Provider    Answer:   BEDSOLE, AMY E [[id number]]   No orders of the defined types were placed in this encounter.   I discussed the assessment and treatment plan with the patient. The patient was provided an opportunity to ask questions and all were answered. The patient agreed with the plan and demonstrated an understanding of the instructions. The patient was advised to call back or seek an in-person evaluation if the symptoms worsen or if the condition fails to improve as  anticipated.  Follow up plan:  Start Doxycycline antibiotic for the infection. Take 1 tablet by mouth twice daily for 7 days.  You may take the Tessalon Perles up to three times daily as needed for cough.   Please update me if no improvement.   It was a pleasure to see you today! [name], NP-C   [name], NP

## 2020-12-21 ENCOUNTER — Other Ambulatory Visit: Payer: Self-pay | Admitting: Orthopedic Surgery

## 2020-12-21 DIAGNOSIS — M5441 Lumbago with sciatica, right side: Secondary | ICD-10-CM

## 2020-12-21 DIAGNOSIS — Z96649 Presence of unspecified artificial hip joint: Secondary | ICD-10-CM

## 2020-12-22 ENCOUNTER — Telehealth: Payer: Medicare Other | Admitting: Primary Care

## 2021-01-01 ENCOUNTER — Encounter
Admission: RE | Admit: 2021-01-01 | Discharge: 2021-01-01 | Disposition: A | Discharge status: Home / Self Care | Payer: Medicare Other | Source: Ambulatory Visit | Attending: Orthopedic Surgery | Admitting: Orthopedic Surgery

## 2021-01-01 ENCOUNTER — Other Ambulatory Visit: Payer: Self-pay

## 2021-01-01 DIAGNOSIS — M79651 Pain in right thigh: Secondary | ICD-10-CM | POA: Diagnosis not present

## 2021-01-01 DIAGNOSIS — M5441 Lumbago with sciatica, right side: Secondary | ICD-10-CM | POA: Insufficient documentation

## 2021-01-01 DIAGNOSIS — Z96649 Presence of unspecified artificial hip joint: Secondary | ICD-10-CM | POA: Diagnosis not present

## 2021-01-01 MED ORDER — TECHNETIUM TC 99M MEDRONATE IV KIT
20.0000 | PACK | Freq: Once | INTRAVENOUS | Status: AC | PRN
  Administered 2021-01-01: 20.82 via INTRAVENOUS

## 2021-01-02 NOTE — Progress Notes (Addendum)
Patient: [name]  Service Category: E/M  Provider: Gaspar Cola, MD  DOB: December 19, 1935  DOS: 01/03/2021  Referring Provider: Hessie Knows, MD  MRN: [id number]  Setting: Ambulatory outpatient  PCP: [name], NP  Type: New Patient  Specialty: Interventional Pain Management    Location: Office  Delivery: Face-to-face     Primary Reason(s) for Visit: Encounter for initial evaluation of one or more chronic problems (new to examiner) potentially causing chronic pain, and posing a threat to normal musculoskeletal function. (Level of risk: High) CC: Leg Pain (Right leg, s/p hip replacement Dr Rudene Christians  11/08/20  more pain in the right groin.  Lagging leg, has to drag up after taking a step with the left leg. ), Back Pain (Lumbar left side ), and shoulder (Bilateral, total reverse shoulder replacement on the left and rotator cuff repair bilateral )  HPI  [name] is a 85 y.o. year old, female patient, who comes for the first time to our practice referred by Hessie Knows, MD for our initial evaluation of her chronic pain. She has Anxiety; Arthritis; Hypercholesteremia; Compression fracture of L4 lumbar vertebra, sequela; Idiopathic pulmonary fibrosis ([place]); Postinflammatory pulmonary fibrosis ([place]); Osteopetrosis; Family history of colon cancer requiring screening colonoscopy; Vitamin D deficiency; Fatigue; Osteopenia; Vitamin B 12 deficiency; COVID-19 virus infection; Subcapital fracture of femur, sequela (Right); C. difficile diarrhea; Cough; Chronic pain syndrome; Pharmacologic therapy; Disorder of skeletal system; Problems influencing health status; Chronic groin pain (2ry area of Pain) (Right); Chronic low back pain (1ry area of Pain) (Bilateral) (L>R) w/o sciatica; Chronic hip pain after total replacement (Right); Chronic shoulder pain (3ry area of Pain) (Bilateral) (L>R); Abnormal MRI, lumbar spine (12/06/2020); T12 compression fracture, sequela; Compression fracture of L1 lumbar  vertebra, sequela; Compression fracture of L3 lumbar vertebra, sequela; Lumbosacral facet arthropathy (Multilevel) (Bilateral); Lumbar facet syndrome; Spondylosis without myelopathy or radiculopathy, lumbosacral region; DDD (degenerative disc disease), lumbosacral; DDD (degenerative disc disease), thoracolumbar; Chronic use of opiate for therapeutic purpose; Chronic radicular pain of lower back; Lumbar spondylosis; Osteoporosis with current pathological fracture, sequela; Osteoporosis with pathological fracture of lumbar vertebra ([place]); Trigger point of shoulder region (Left); Tendinitis of triceps (Left); Chronic upper back pain; Chronic thoracic back pain (Left); Trigger point with back pain; Lower extremity weakness (Bilateral); Myofascial pain syndrome of lumbar spine; Myofascial pain syndrome of thoracic spine; and Cervicalgia on their problem list. Today she comes in for evaluation of her Leg Pain (Right leg, s/p hip replacement Dr Rudene Christians  11/08/20  more pain in the right groin.  Lagging leg, has to drag up after taking a step with the left leg. ), Back Pain (Lumbar left side ), and shoulder (Bilateral, total reverse shoulder replacement on the left and rotator cuff repair bilateral )  Pain Assessment: Location: Right (see visit info for other sites.) Leg Radiating: right hip into groin and leg Onset: More than a month ago Duration: Chronic pain Quality: Discomfort, Constant, Aching, Dull Severity: 5 /10 (subjective, self-reported pain score)  Effect on ADL: gait is very much affected, limited ROM d/t shoulders, left is worse. Timing: Constant Modifying factors: pain medications BP: (!) 156/85  HR: 73  Onset and Duration: Sudden, Started with accident and Present longer than 3 months Cause of pain: Surgery Severity: Getting worse, NAS-11 at its worse: 9/10, NAS-11 at its best: 5/10, NAS-11 now: 5/10 and NAS-11 on the average: 8/10 Timing: Not influenced by the time of the day Aggravating  Factors: Bending, Prolonged sitting, Twisting and Walking Alleviating  Factors: Lying down, Medications and Resting Associated Problems: Fatigue and Inability to concentrate Quality of Pain: Aching, Annoying, Distressing, Dull, Getting longer and Uncomfortable Previous Examinations or Tests: Bone scan, CT scan, MRI scan and X-rays Previous Treatments: Epidural steroid injections and Trigger point injections  According to the patient the primary area of pain is that of the right groin.  She indicates having had a right hip replacement by Dr. Rudene Christians on 11/08/2020.  She denies physical therapy, prior nerve blocks or joint injections, but she does indicate having had an x-ray that is less than 46 years old.  The patient's second area pain is that of the lower back (Left) (Midline).  The patient denies any pain in the right side of the lower back.  She does have a history of vertebral body fractures with kyphoplasty x3.  She has been treated at [facility] by Dr. Wynn Banker.  She indicates having had nerve blocks in her back by Dr. Nilda Simmer, and Dr. Sharlet Salina, among others.  She indicates having had physical therapy at [facility] and having had fairly recent x-rays and/or MRIs.  The patient's third area of pain is that of the shoulders (Bilateral) (L>R).  The patient indicates having had a right shoulder rotator cuff surgery on 2010, a left total arthroplasty reversal on 2012 and a left rotator cuff surgery around 2011.  She admits to having had physical therapy but denies any recent x-rays.  She denies any nerve blocks or joint injections.  The patient indicates that she has been a patient of [facility] where she was seen Dr. Kerry Dory for medications and injections, but he left hand she is not connected well with the replacement (nurse practitioner).  She refers that she used to take hydromorphone 4 mg p.o. 3 times daily which worked well in controlling her pain but she was then switched to hydrocodone/APAP 7.5/325 1  tablet p.o. 3 times daily and recently increased to 4 times daily.  She also takes diclofenac and citalopram for anxiety.  She indicates having been on opioids for many years, nonstop.  She reports having had side effects to the use of meloxicam where she developed some peptic ulcers.  The patient is accompanied to the visit by her husband.  Today I took the time to provide the patient with information regarding my pain practice. The patient was informed that my practice is divided into two sections: an interventional pain management section, as well as a completely separate and distinct medication management section. I explained that I have procedure days for my interventional therapies, and evaluation days for follow-ups and medication management. Because of the amount of documentation required during both, they are kept separated. This means that there is the possibility that she may be scheduled for a procedure on one day, and medication management the next. I have also informed her that because of staffing and facility limitations, I no longer take patients for medication management only. To illustrate the reasons for this, I gave the patient the example of surgeons, and how inappropriate it would be to refer a patient to his/her care, just to write for the post-surgical antibiotics on a surgery done by a different surgeon.   Because interventional pain management is my board-certified specialty, the patient was informed that joining my practice means that they are open to any and all interventional therapies. I made it clear that this does not mean that they will be forced to have any procedures done. What this means is that I believe interventional therapies  to be essential part of the diagnosis and proper management of chronic pain conditions. Therefore, patients not interested in these interventional alternatives will be better served under the care of a different practitioner.  The patient was also  made aware of my Comprehensive Pain Management Safety Guidelines where by joining my practice, they limit all of their nerve blocks and joint injections to those done by our practice, for as long as we are retained to manage their care.   Historic Controlled Substance Pharmacotherapy Review  PMP and historical list of controlled substances: Hydrocodone/APAP 7.5/325, 1 tab p.o. 3 times daily (filled on 12/16/2020); oxycodone IR 5 mg, 1 tab p.o. 3 times daily; hydromorphone 4 mg tab, 1 tab p.o. 3 times daily to 4 times daily. Current opioid analgesics: Hydrocodone/APAP 7.5/325, 1 tab p.o. 3 times daily (22.5 mg/day of hydrocodone) (filled on 12/16/2020) (22.5 MME) MME/day: 22.5 mg/day  Historical Monitoring: The patient  reports no history of drug use. List of all UDS Test(s): No results found for: MDMA, COCAINSCRNUR, [place], [place], CANNABQUANT, THCU, [place] List of other Serum/Urine Drug Screening Test(s):  No results found for: AMPHSCRSER, BARBSCRSER, BENZOSCRSER, COCAINSCRSER, COCAINSCRNUR, PCPSCRSER, PCPQUANT, THCSCRSER, THCU, CANNABQUANT, OPIATESCRSER, OXYSCRSER, PROPOXSCRSER, ETH Historical Background Evaluation: [place] PMP: PDMP reviewed during this encounter. Online review of the past 102-monthperiod conducted.             PMP NARX Score Report:  Narcotic: 400 Sedative: 180 Stimulant: 000 [place] Department of public safety, offender search: (Editor, commissioningInformation) Non-contributory Risk Assessment Profile: Aberrant behavior: None observed or detected today Risk factors for fatal opioid overdose: None identified today PMP NARX Overdose Risk Score: 150 Fatal overdose hazard ratio (HR): Calculation deferred Non-fatal overdose hazard ratio (HR): Calculation deferred Risk of opioid abuse or dependence: 0.7-3.0% with doses ? 36 MME/day and 6.1-26% with doses ? 120 MME/day. Substance use disorder (SUD) risk level: See below Personal History of Substance Abuse (SUD-Substance use disorder):  Alcohol:  Negative  Illegal Drugs: Negative  Rx Drugs: Negative  ORT Risk Level calculation: Low Risk  Opioid Risk Tool - 01/03/21 1347       Family History of Substance Abuse   Alcohol Negative    Illegal Drugs Negative    Rx Drugs Negative      Personal History of Substance Abuse   Alcohol Negative    Illegal Drugs Negative    Rx Drugs Negative      Age   Age between 122-45years  No      Psychological Disease   Psychological Disease Positive    ADD Negative    OCD Negative    Bipolar Negative    Schizophrenia Negative    Depression Positive   anxiety     Total Score   Opioid Risk Tool Scoring 3    Opioid Risk Interpretation Low Risk            ORT Scoring interpretation table:  Score <3 = Low Risk for SUD  Score between 4-7 = Moderate Risk for SUD  Score >8 = High Risk for Opioid Abuse   PHQ-2 Depression Scale:  Total score:    PHQ-2 Scoring interpretation table: (Score and probability of major depressive disorder)  Score 0 = No depression  Score 1 = 15.4% Probability  Score 2 = 21.1% Probability  Score 3 = 38.4% Probability  Score 4 = 45.5% Probability  Score 5 = 56.4% Probability  Score 6 = 78.6% Probability   PHQ-9 Depression Scale:  Total score:  PHQ-9 Scoring interpretation table:  Score 0-4 = No depression  Score 5-9 = Mild depression  Score 10-14 = Moderate depression  Score 15-19 = Moderately severe depression  Score 20-27 = Severe depression (2.4 times higher risk of SUD and 2.89 times higher risk of overuse)   Pharmacologic Plan: As per protocol, I have not taken over any controlled substance management, pending the results of ordered tests and/or consults.            Initial impression: Pending review of available data and ordered tests.  Meds   Current Outpatient Medications:    citalopram (CELEXA) 40 MG tablet, TAKE 1 TABLET (40 MG TOTAL) BY MOUTH DAILY. FOR ANXIETY., Disp: 90 tablet, Rfl: 1   denosumab (PROLIA) 60 MG/ML SOSY injection,  Inject 60 mg into the skin every 6 (six) months., Disp: , Rfl:    ESBRIET 267 MG TABS, Take 267 mg by mouth 3 (three) times daily with meals., Disp: , Rfl:    HYDROcodone-acetaminophen (NORCO) 7.5-325 MG tablet, Take 1 tablet by mouth in the morning, at noon, in the evening, and at bedtime., Disp: , Rfl:    OXYGEN, Inhale 2 L into the lungs at bedtime., Disp: , Rfl:    vitamin B-12 (CYANOCOBALAMIN) 1000 MCG tablet, Take 1,000 mcg by mouth every other day., Disp: , Rfl:    VOLTAREN 1 % GEL, Apply 2 g topically in the morning, at noon, and at bedtime. Shoulders and back, Disp: , Rfl: 2   atorvastatin (LIPITOR) 20 MG tablet, Take 1 tablet (20 mg total) by mouth daily. For cholesterol., Disp: 90 tablet, Rfl: 3   budesonide (PULMICORT) 180 MCG/ACT inhaler, Inhale 2 puffs into the lungs 2 (two) times daily., Disp: , Rfl:    calcium carbonate (OSCAL) 1500 (600 Ca) MG TABS tablet, Take 1 tablet (1,500 mg total) by mouth 2 (two) times daily with a meal., Disp: 60 tablet, Rfl: 2   Calcium Carbonate-Vitamin D 600-200 MG-UNIT TABS, Take 1 tablet by mouth daily., Disp: , Rfl:    Cholecalciferol (VITAMIN D3) 125 MCG (5000 UT) CAPS, Take 1 capsule (5,000 Units total) by mouth daily with breakfast. Take along with calcium and magnesium. (Patient not taking: Reported on 05/10/2021), Disp: 30 capsule, Rfl: 2   HYDROcodone bit-homatropine (HYCODAN) 5-1.5 MG/5ML syrup, Take 2.5 mLs by mouth every 6 (six) hours as needed for cough., Disp: , Rfl:    Magnesium Oxide 500 MG CAPS, Take 1 capsule (500 mg total) by mouth 2 (two) times daily at 8 am and 10 pm. (Patient not taking: Reported on 05/10/2021), Disp: 180 capsule, Rfl: 0   metoprolol succinate (TOPROL-XL) 25 MG 24 hr tablet, Take 25 mg by mouth daily., Disp: , Rfl:    sulfaSALAzine (AZULFIDINE) 500 MG tablet, Take 1 tablet (500 mg total) by mouth 2 (two) times daily., Disp: 180 tablet, Rfl: 3  Imaging Review  Cervical Imaging: Cervical DG complete: Results for orders  placed during the hospital encounter of 11/12/17 DG Cervical Spine Complete  Narrative CLINICAL DATA:  Left-sided neck pain.  Headache.  EXAM: CERVICAL SPINE - COMPLETE 4+ VIEW  COMPARISON:  02/08/2014.  FINDINGS: Diffuse severe osteopenia degenerative change. Straightening of the cervical spine noted. C5-C6 and C6-C7 disc degeneration with endplate osteophyte formation. Mild bilateral neural foraminal narrowing secondary to osteophyte formation noted. Bilateral carotid vascular calcification.  IMPRESSION: 1. Diffuse osteopenia and degenerative change. Mild bilateral neural foraminal narrowing noted secondary osteophyte formation. No acute abnormality.  2.  Bilateral carotid atherosclerotic vascular  disease.   Electronically Signed By: Marcello Moores  Register On: 11/13/2017 07:49  Lumbosacral Imaging: Lumbar MR wo contrast: Results for orders placed during the hospital encounter of 12/06/20 MR LUMBAR SPINE WO CONTRAST  Narrative CLINICAL DATA:  Acute right-sided low back pain with right-sided sciatica.  EXAM: MRI LUMBAR SPINE WITHOUT CONTRAST  TECHNIQUE: Multiplanar, multisequence MR imaging of the lumbar spine was performed. No intravenous contrast was administered.  COMPARISON:  Radiographs April 20, 2018.  FINDINGS: Segmentation:  Standard.  Alignment: Levoconvex scoliosis of the lumbar spine. Small anterolisthesis of L4 over L5.  Vertebrae: No acute fracture, evidence of discitis, or bone lesion. Chronic compression fractures of the T12, L1 and L4 vertebral bodies status post vertebral augmentation. Chronic compression fracture of the L3 vertebral body. There is small retropulsion of the superior aspect of the T12 posterior wall into the spinal canal causing indentation on the thecal sac without significant spinal canal stenosis.  Conus medullaris and cauda equina: Conus extends to the L1 level. Conus and cauda equina appear normal.  Paraspinal and other  soft tissues: Negative.  Disc levels:  T12-L1: No spinal canal or neural foraminal stenosis.  L1-2: Shallow disc bulge and mild facet degenerative change resulting in mild bilateral neural foraminal narrowing. No spinal canal stenosis.  L2-3: Shallow disc bulge and mild facet degenerative change resulting in mild-to-moderate right mild left neural foraminal narrowing.  L3-4: Loss of disc height, disc bulge and moderate facet degenerative changes resulting in severe right and moderate left neural foraminal narrowing. No significant spinal canal stenosis.  L4-5: Shallow disc bulge/disc uncovering and prominent hypertrophic facet degenerative changes with ligamentum flavum redundancy resulting mild spinal canal stenosis. No significant neural foraminal narrowing.  L5-S1: Moderate right and advanced left hypertrophic facet degenerative changes without significant spinal canal or neural foraminal stenosis.  IMPRESSION: 1. No acute abnormality of the lumbar spine. 2. Chronic compression fractures of the T12, L1 and L4 vertebral bodies status post vertebral augmentation. Chronic compression fracture of the L3 vertebral body. 3. Multilevel degenerative changes of the lumbar spine, more pronounced at the facet joints in the lower lumbar spine with mild spinal canal stenosis at L4-L5. 4. Severe right and moderate left neural foraminal narrowing at L3-4.   Electronically Signed By: Pedro Earls M.D. On: 12/06/2020 17:53  Lumbar DG (Complete) 4+V: Results for orders placed during the hospital encounter of 04/20/18 DG Lumbar Spine Complete  Narrative CLINICAL DATA:  Patient fell outside on concrete steps. Low back pain.  EXAM: LUMBAR SPINE - COMPLETE 4+ VIEW  COMPARISON:  12/10/2013 [facility], MRI 03/09/2009  FINDINGS: Diffuse generalized osteopenia of the included thoracolumbar spine. Vertebral augmentation at L1 and L4. Moderate 50% height loss of L1 is seen  on the lateral projection, stable since prior MRI. Superior endplate depression of L3 is stable. There is moderate degenerative disc flattening consistent with degenerative disc disease L3-4. Lower lumbar facet arthropathy is identified along the entirety of the lumbar spine greatest from L3 through S1.  IMPRESSION: 1. Osteopenic appearance of the included lower thoracic and lumbar spine with vertebral augmentation at L1 and L4. 2. Chronic compression deformities L1 and superior endplate of L3 are stable. 3. Lumbar facet arthropathy is identified L3 through S1.   Electronically Signed By: Ashley Royalty M.D. On: 04/20/2018 17:58        Knee Imaging: Knee-R DG 4 views: Results for orders placed during the hospital encounter of 04/20/18 DG Knee Complete 4 Views Right  Narrative CLINICAL DATA:  Patient fell outside on concrete steps. Skin tear of left lower leg and abrasions both knees.  EXAM: RIGHT KNEE - COMPLETE 4+ VIEW  COMPARISON:  None.  FINDINGS: There is a moderate-sized suprapatellar joint effusion associated with acute transverse fractures of the mid patella without distraction of the fracture fragments. Shallow depression of the lateral tibial plateau suggested as well suspicious for a lateral tibial plateau fracture. Infrapatellar anterior soft tissue laceration is identified. No fracture of the included femur. Intact included fibula.  IMPRESSION: 1. Acute, closed, transverse fractures of the mid patella without distraction of the fracture fragments. 2. Moderate suprapatellar joint effusion. 3. Suggestion of mild shallow depression of the lateral tibial plateau potentially representing a Schatzker type 3 lateral tibial plateau fracture with slight depression of the tibial plateau.   Electronically Signed By: Ashley Royalty M.D. On: 04/20/2018 17:51  Foot Imaging: Foot-L DG Complete: Results for orders placed during the hospital encounter of 09/05/18 DG Foot  Complete Left  Narrative CLINICAL DATA:  LEFT foot pain, no injury  EXAM: LEFT FOOT - COMPLETE 3+ VIEW  COMPARISON:  04/17/2017  FINDINGS: Osseous demineralization.  Deformity of the distal first metatarsal from remote osteotomy.  Degenerative changes first MTP joint and at TMT joints.  Remain joint spaces preserved.  No acute fracture, dislocation, or bone destruction.  Mild soft tissue swelling overlying the dorsum of the foot at the level of the distal metatarsals.  Small vessel vascular calcifications at ankle.  IMPRESSION: Scattered degenerative changes as above.  No acute abnormalities.   Electronically Signed By: Lavonia Dana M.D. On: 09/05/2018 20:05  Complexity Note: Imaging results reviewed. Results shared with [name], using Layman's terms.              ROS  Cardiovascular: Needs antibiotics prior to dental procedures Pulmonary or Respiratory: Lung problems and Shortness of breath Neurological: No reported neurological signs or symptoms such as seizures, abnormal skin sensations, urinary and/or fecal incontinence, being born with an abnormal open spine and/or a tethered spinal cord Psychological-Psychiatric: Anxiousness Gastrointestinal: No reported gastrointestinal signs or symptoms such as vomiting or evacuating blood, reflux, heartburn, alternating episodes of diarrhea and constipation, inflamed or scarred liver, or pancreas or irrregular and/or infrequent bowel movements Genitourinary: No reported renal or genitourinary signs or symptoms such as difficulty voiding or producing urine, peeing blood, non-functioning kidney, kidney stones, difficulty emptying the bladder, difficulty controlling the flow of urine, or chronic kidney disease Hematological: Brusing easily Endocrine: No reported endocrine signs or symptoms such as high or low blood sugar, rapid heart rate due to high thyroid levels, obesity or weight gain due to slow thyroid or thyroid  disease Rheumatologic: Joint aches and or swelling due to excess weight (Osteoarthritis) Musculoskeletal: Negative for myasthenia gravis, muscular dystrophy, multiple sclerosis or malignant hyperthermia Work History: Retired  Allergies  [name] has No Known Allergies.  Laboratory Chemistry Profile   Renal Lab Results  Component Value Date   BUN 24 (H) 04/21/2021   CREATININE 0.73 04/21/2021   BCR 23 09/21/2020   GFR 75.35 10/24/2020   GFRAA 76 09/21/2020   GFRNONAA >60 04/21/2021   SPECGRAV 1.025 02/16/2020   PHUR 7.0 02/16/2020   PROTEINUR 30 (A) 04/21/2021     Electrolytes Lab Results  Component Value Date   NA 139 04/21/2021   K 4.4 04/21/2021   CL 106 04/21/2021   CALCIUM 9.2 04/21/2021   MG 2.1 09/29/2020   PHOS 3.3 05/28/2020     Hepatic Lab Results  Component Value Date   AST 36 04/21/2021   ALT 25 04/21/2021   ALBUMIN 3.8 04/21/2021   ALKPHOS 69 04/21/2021   LIPASE 35 04/21/2021     ID Lab Results  Component Value Date   SARSCOV2NAA NEGATIVE 10/02/2020     Bone Lab Results  Component Value Date   VD25OH 37.79 10/24/2020   25OHVITD1 42 01/03/2021   25OHVITD2 <1.0 01/03/2021   25OHVITD3 42 01/03/2021     Endocrine Lab Results  Component Value Date   GLUCOSE 126 (H) 04/21/2021   GLUCOSEU NEGATIVE 04/21/2021   HGBA1C 6.1 02/09/2014   TSH 1.53 03/15/2020     Neuropathy Lab Results  Component Value Date   VITAMINB12 676 01/03/2021   HGBA1C 6.1 02/09/2014     CNS No results found for: COLORCSF, APPEARCSF, RBCCOUNTCSF, WBCCSF, POLYSCSF, LYMPHSCSF, EOSCSF, PROTEINCSF, GLUCCSF, JCVIRUS, CSFOLI, IGGCSF, LABACHR, ACETBL, LABACHR, ACETBL   Inflammation (CRP: Acute  ESR: Chronic) Lab Results  Component Value Date   CRP <1 01/03/2021   ESRSEDRATE 13 01/03/2021     Rheumatology No results found for: RF, ANA, LABURIC, URICUR, LYMEIGGIGMAB, LYMEABIGMQN, HLAB27   Coagulation Lab Results  Component Value Date   PLT 263 04/21/2021      Cardiovascular Lab Results  Component Value Date   BNP 92.7 05/09/2021   TROPONINI <0.03 10/07/2018   HGB 13.7 04/21/2021   HCT 41.3 04/21/2021     Screening Lab Results  Component Value Date   SARSCOV2NAA NEGATIVE 10/02/2020     Cancer Lab Results  Component Value Date   CEA 3.4 02/03/2017     Allergens No results found for: ALMOND, APPLE, ASPARAGUS, AVOCADO, BANANA, BARLEY, BASIL, BAYLEAF, GREENBEAN, LIMABEAN, WHITEBEAN, BEEFIGE, REDBEET, BLUEBERRY, BROCCOLI, CABBAGE, MELON, CARROT, CASEIN, CASHEWNUT, CAULIFLOWER, CELERY     Note: Lab results reviewed.  [place]  Drug: [name]  reports no history of drug use. Alcohol:  reports no history of alcohol use. Tobacco:  reports that she has never smoked. She has never used smokeless tobacco. Medical:  has a past medical history of Altered mental state, Anxiety, BMI 30.0-30.9,adult, Cellulitis, Chest pain, atypical, Compression fracture of L4 lumbar vertebra, Constipation, Cystitis, Cystocele, Diverticulosis, Fatigue, Fibrosis, idiopathic pulmonary ([place]), Gastroenteritis, History of back surgery, History of herniated intervertebral disc, HTN (hypertension), Hypercholesteremia, Hypocalcemia, Incontinence of urine, OA (osteoarthritis), Obesity, Osteopenia, Pneumonia, Shortness of breath, Sleep apnea, Squamous cell carcinoma of skin (07/16/2016), Squamous cell carcinoma of skin (01/13/2018), Thrush, and Vitamin D deficiency. Family: family history includes COPD in her mother; Cancer in her brother.  Past Surgical History:  Procedure Laterality Date   BACK SURGERY     CATARACT EXTRACTION W/PHACO Left 04/24/2016   Procedure: CATARACT EXTRACTION PHACO AND INTRAOCULAR LENS PLACEMENT (IOC);  Surgeon: Leandrew Koyanagi, MD;  Location: [facility];  Service: Ophthalmology;  Laterality: Left;   CATARACT EXTRACTION W/PHACO Right 07/14/2017   Procedure: CATARACT EXTRACTION PHACO AND INTRAOCULAR LENS PLACEMENT ([place])  RIGHT;  Surgeon:  Leandrew Koyanagi, MD;  Location: [facility];  Service: Ophthalmology;  Laterality: Right;   CHOLECYSTECTOMY     CYSTOSCOPY/URETEROSCOPY/HOLMIUM LASER/STENT PLACEMENT Left 04/27/2021   Procedure: CYSTOSCOPY/URETEROSCOPY/HOLMIUM LASER/STENT PLACEMENT;  Surgeon: Billey Co, MD;  Location: [facility];  Service: Urology;  Laterality: Left;   ESOPHAGOGASTRODUODENOSCOPY (EGD) WITH PROPOFOL N/A 10/13/2018   Procedure: ESOPHAGOGASTRODUODENOSCOPY (EGD) WITH PROPOFOL;  Surgeon: [name], MD;  Location: [facility];  Service: Gastroenterology;  Laterality: N/A;   FLEXIBLE SIGMOIDOSCOPY N/A 10/13/2018   Procedure: FLEXIBLE SIGMOIDOSCOPY;  Surgeon: [name], MD;  Location: [facility];  Service: Gastroenterology;  Laterality: N/A;   FLEXIBLE SIGMOIDOSCOPY N/A 09/09/2019   Procedure: FLEXIBLE SIGMOIDOSCOPY;  Surgeon: [name], MD;  Location: [facility]COPY;  Service: Gastroenterology;  Laterality: N/A;   FLEXIBLE SIGMOIDOSCOPY N/A 08/04/2020   Procedure: FLEXIBLE SIGMOIDOSCOPY;  Surgeon: [name], MD;  Location: [facility];  Service: Gastroenterology;  Laterality: N/A;  Per Dr. Vicente Males, unsedated   HIP ARTHROPLASTY Right 09/27/2020   Procedure: ARTHROPLASTY BIPOLAR HIP (HEMIARTHROPLASTY);  Surgeon: Hessie Knows, MD;  Location: [facility];  Service: Orthopedics;  Laterality: Right;   ROTATOR CUFF REPAIR Bilateral    left-12/2009; right-03/2009 dr.[email or web address]   TONSILLECTOMY     TOTAL SHOULDER REPLACEMENT Left    Active Ambulatory Problems    Diagnosis Date Noted   Anxiety 02/02/2015   Arthritis 02/02/2015   Hypercholesteremia 04/24/2015   Compression fracture of L4 lumbar vertebra, sequela 04/24/2015   Idiopathic pulmonary fibrosis ([place]) 04/24/2015   Postinflammatory pulmonary fibrosis ([place]) 05/04/2014   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 difficile diarrhea 09/30/2020   Cough 12/20/2020   Chronic pain syndrome 01/03/2021   Pharmacologic therapy 01/03/2021   Disorder of skeletal system 01/03/2021   Problems influencing health status 01/03/2021   Chronic groin pain (2ry area of Pain) (Right) 01/03/2021   Chronic low back pain (1ry area of Pain) (Bilateral) (L>R) w/o sciatica 01/03/2021   Chronic hip pain after total replacement (Right) 01/03/2021   Chronic shoulder pain (3ry area of Pain) (Bilateral) (L>R) 01/03/2021   Abnormal MRI, lumbar spine (12/06/2020) 01/15/2021   T12 compression fracture, sequela 01/15/2021   Compression fracture of L1 lumbar vertebra, sequela 01/15/2021   Compression fracture of L3 lumbar vertebra, sequela 01/15/2021   Lumbosacral facet arthropathy (Multilevel) (Bilateral) 01/16/2021   Lumbar facet syndrome 01/16/2021   Spondylosis without myelopathy or radiculopathy, lumbosacral region 01/16/2021   DDD (degenerative disc disease), lumbosacral 01/16/2021   DDD (degenerative disc disease), thoracolumbar 01/16/2021   Chronic use of opiate for therapeutic purpose 01/16/2021   Chronic radicular pain of lower back 02/13/2021   Lumbar spondylosis 02/13/2021   Osteoporosis with current pathological fracture, sequela 04/24/2021   Osteoporosis with pathological fracture of lumbar vertebra ([place]) 04/24/2021   Trigger point of shoulder region (Left) 04/24/2021   Tendinitis of triceps (Left) 04/24/2021   Chronic upper back pain 04/24/2021   Chronic thoracic back pain (Left) 04/24/2021   Trigger point with back pain 04/24/2021   Lower extremity weakness (Bilateral) 04/24/2021   Myofascial pain syndrome of lumbar spine 04/24/2021   Myofascial pain syndrome of thoracic spine 04/24/2021   Cervicalgia 04/24/2021   Resolved Ambulatory Problems    Diagnosis Date Noted    HTN (hypertension) 02/02/2015   Sleep apnea 02/02/2015   Cystocele with uterine descensus 03/30/2015   Vaginal pessary in situ 04/19/2015   Mild cognitive impairment 04/24/2015   Candidiasis 01/17/2016   Tinea corporis 04/26/2016   Slow transit constipation 04/26/2016   Acute diarrhea 09/29/2018   Rectal bleeding 10/06/2018   Diarrhea of presumed infectious origin 10/06/2018   Laceration of skin of right lower leg 05/18/2019   Female cystocele 05/27/2019   Urinary incontinence 05/27/2019   Sensation of fullness in both ears 08/23/2019   Gastroenteritis due to COVID-19 virus 05/27/2020  Viral URI with cough 08/10/2020   Past Medical History:  Diagnosis Date   Altered mental state    BMI 30.0-30.9,adult    Cellulitis    Chest pain, atypical    Compression fracture of L4 lumbar vertebra    Constipation    Cystitis    Cystocele    Diverticulosis    Fibrosis, idiopathic pulmonary (H[facility])    Gastroenteritis    History of back surgery    History of herniated intervertebral disc    Hypocalcemia    Incontinence of urine    OA (osteoarthritis)    Obesity    Pneumonia    Shortness of breath    Squamous cell carcinoma of skin 07/16/2016   Squamous cell carcinoma of skin 01/13/2018   Thrush    Constitutional Exam  General appearance: Well nourished, well developed, and well hydrated. In no apparent acute distress Vitals:   01/03/21 1341  BP: (!) 156/85  Pulse: 73  Resp: 16  Temp: 98.1 F (36.7 C)  TempSrc: Temporal  SpO2: 94%  Weight: 123 lb (55.8 kg)  Height: 5' (1.524 m)   BMI Assessment: Estimated body mass index is 24.02 kg/m as calculated from the following:   Height as of this encounter: 5' (1.524 m).   Weight as of this encounter: 123 lb (55.8 kg).  BMI interpretation table: BMI level Category Range association with higher incidence of chronic pain  <18 kg/m2 Underweight   18.5-24.9 kg/m2 Ideal body weight   25-29.9 kg/m2 Overweight Increased incidence by  20%  30-34.9 kg/m2 Obese (Class I) Increased incidence by 68%  35-39.9 kg/m2 Severe obesity (Class II) Increased incidence by 136%  >40 kg/m2 Extreme obesity (Class III) Increased incidence by 254%   Patient's current BMI Ideal Body weight  Body mass index is 24.02 kg/m. Ideal body weight: 45.5 kg (100 lb 4.9 oz) Adjusted ideal body weight: 49.6 kg (109 lb 6.2 oz)   BMI Readings from Last 4 Encounters:  05/10/21 25.39 kg/m  04/26/21 25.39 kg/m  04/24/21 25.39 kg/m  04/21/21 25.39 kg/m   Wt Readings from Last 4 Encounters:  04/26/21 130 lb (59 kg)  04/24/21 130 lb (59 kg)  04/21/21 130 lb (59 kg)  03/20/21 130 lb (59 kg)    Psych/Mental status: Alert, oriented x 3 (person, place, & time)       Eyes: PERLA Respiratory: No evidence of acute respiratory distress  Assessment  Primary Diagnosis & Pertinent Problem List: The primary encounter diagnosis was Chronic pain syndrome. Diagnoses of Pharmacologic therapy, Disorder of skeletal system, Problems influencing health status, Vitamin B 12 deficiency, Vitamin D deficiency, Chronic groin pain (1ry area of Pain) (Right), Chronic low back pain (2ry area of Pain) (Left) w/o sciatica, Chronic hip pain after total replacement (Right), and Chronic shoulder pain (3ry area of Pain) (Bilateral) (L>R) were also pertinent to this visit.  Visit Diagnosis (New problems to examiner): 1. Chronic pain syndrome   2. Pharmacologic therapy   3. Disorder of skeletal system   4. Problems influencing health status   5. Vitamin B 12 deficiency   6. Vitamin D deficiency   7. Chronic groin pain (1ry area of Pain) (Right)   8. Chronic low back pain (2ry area of Pain) (Left) w/o sciatica   9. Chronic hip pain after total replacement (Right)   10. Chronic shoulder pain (3ry area of Pain) (Bilateral) (L>R)    Plan of Care (Initial workup plan)  Note: [name] was reminded that as per  protocol, today's visit has been an evaluation only. We have not  taken over the patient's controlled substance management.  Problem-specific plan: No problem-specific Assessment & Plan notes found for this encounter.  Lab Orders         Compliance Drug Analysis, Ur         Vitamin B12         Sedimentation rate         25-Hydroxy vitamin D Lcms D2+D3         C-reactive protein    Imaging Orders         DG Shoulder Right         DG Shoulder Left    Referral Orders  No referral(s) requested today   Procedure Orders    No procedure(s) ordered today   Pharmacotherapy (current): Medications ordered:  No orders of the defined types were placed in this encounter.  Medications administered during this visit: [name] had no medications administered during this visit.   Pharmacological management options:  Opioid Analgesics: The patient was informed that there is no guarantee that she would be a candidate for opioid analgesics. The decision will be made following CDC guidelines. This decision will be based on the results of diagnostic studies, as well as [name]'s risk profile.   Membrane stabilizer: To be determined at a later time  Muscle relaxant: To be determined at a later time  NSAID: To be determined at a later time  Other analgesic(s): To be determined at a later time   Interventional management options: [name] was informed that there is no guarantee that she would be a candidate for interventional therapies. The decision will be based on the results of diagnostic studies, as well as Ms. Kruck's risk profile.  Procedure(s) under consideration:  Pending results of ordered test   Provider-requested follow-up: Return for (40 min) 2V on evaluation day, (s/p Tests).  Future Appointments  Date Time Provider [place]  05/17/2021 10:40 AM Milinda Pointer, MD [facility] None  06/21/2021 10:45 AM Ralene Bathe, MD [facility] None  08/28/2021  1:30 PM [name], MD [facility] None  02/19/2022 10:30 AM [facility]  NURSE HEALTH ADVISOR [facility]  02/26/2022 10:20 AM [name], NP [facility]    Note by: Gaspar Cola, MD Date: 01/03/2021; Time: 11:20 AM

## 2021-01-02 NOTE — Telephone Encounter (Signed)
I spoke with pt; pt said since mid April pt has alternated between a prod cough with clear phlegm and a dry cough. Pt always has SOB but SOB is worse after hard coughing episode. After a "hard" coughing episode pt also has H/A and pt feels agitated after a coughing episode. Pt does not have fever,no CP, diarrhea, vomiting,S/T, runny nose and head congestion. Pt is concerned about breathing test that is coming up with Dr. Raul Del because pt is afraid he may want a CXR or may not be able to do the test if still coughing. Pt is going to go to [facility] for eval and possible testing of cough.  Pt said she has 6 -7 citalopram left and lately pt has been taking every day. [name] does not have appt early in the week and pt said could refill until get appt with [name] or could [name] see pt Tues or Wed next wk as work in. [name]. Pt will wait for cb from [place] about citalopram rx or appt. Sending note to [facility] CMA.

## 2021-01-02 NOTE — Telephone Encounter (Signed)
Agree with in-person evaluation for the cough. It looks like pt is going to [facility].   Please clarify how she is taking the citalopram. It looks like #90 with 1 refill was ordered 09/2020 - she should have a refill available.

## 2021-01-03 ENCOUNTER — Ambulatory Visit: Payer: Medicare Other | Attending: Pain Medicine | Admitting: Pain Medicine

## 2021-01-03 ENCOUNTER — Encounter: Payer: Self-pay | Admitting: Pain Medicine

## 2021-01-03 ENCOUNTER — Other Ambulatory Visit: Payer: Self-pay

## 2021-01-03 VITALS — BP 156/85 | HR 73 | Temp 98.1°F | Resp 16 | Ht 60.0 in | Wt 123.0 lb

## 2021-01-03 DIAGNOSIS — M545 Low back pain, unspecified: Secondary | ICD-10-CM | POA: Insufficient documentation

## 2021-01-03 DIAGNOSIS — Z789 Other specified health status: Secondary | ICD-10-CM | POA: Diagnosis not present

## 2021-01-03 DIAGNOSIS — G8929 Other chronic pain: Secondary | ICD-10-CM | POA: Diagnosis not present

## 2021-01-03 DIAGNOSIS — R1031 Right lower quadrant pain: Secondary | ICD-10-CM | POA: Diagnosis not present

## 2021-01-03 DIAGNOSIS — Z96641 Presence of right artificial hip joint: Secondary | ICD-10-CM | POA: Diagnosis not present

## 2021-01-03 DIAGNOSIS — M899 Disorder of bone, unspecified: Secondary | ICD-10-CM | POA: Diagnosis not present

## 2021-01-03 DIAGNOSIS — E538 Deficiency of other specified B group vitamins: Secondary | ICD-10-CM | POA: Insufficient documentation

## 2021-01-03 DIAGNOSIS — M25511 Pain in right shoulder: Secondary | ICD-10-CM | POA: Diagnosis not present

## 2021-01-03 DIAGNOSIS — E559 Vitamin D deficiency, unspecified: Secondary | ICD-10-CM | POA: Diagnosis not present

## 2021-01-03 DIAGNOSIS — Z79899 Other long term (current) drug therapy: Secondary | ICD-10-CM | POA: Diagnosis not present

## 2021-01-03 DIAGNOSIS — G894 Chronic pain syndrome: Secondary | ICD-10-CM | POA: Insufficient documentation

## 2021-01-03 DIAGNOSIS — M25551 Pain in right hip: Secondary | ICD-10-CM | POA: Insufficient documentation

## 2021-01-03 DIAGNOSIS — M25512 Pain in left shoulder: Secondary | ICD-10-CM | POA: Diagnosis not present

## 2021-01-03 NOTE — Patient Instructions (Signed)
____________________________________________________________________________________________  Drug Holidays (Slow)  What is a "Drug Holiday"? Drug Holiday: is the name given to the period of time during which a patient stops taking a medication(s) for the purpose of eliminating tolerance to the drug.  Benefits . Improved effectiveness of opioids. . Decreased opioid dose needed to achieve benefits. . Improved pain with lesser dose.  What is tolerance? Tolerance: is the progressive decreased in effectiveness of a drug due to its repetitive use. With repetitive use, the body gets use to the medication and as a consequence, it loses its effectiveness. This is a common problem seen with opioid pain medications. As a result, a larger dose of the drug is needed to achieve the same effect that used to be obtained with a smaller dose.  How long should a "Drug Holiday" last? You should stay off of the pain medicine for at least 14 consecutive days. (2 weeks)  Should I stop the medicine "cold [place]"? No. You should always coordinate with your Pain Specialist so that he/she can provide you with the correct medication dose to make the transition as smoothly as possible.  How do I stop the medicine? Slowly. You will be instructed to decrease the daily amount of pills that you take by one (1) pill every seven (7) days. This is called a "slow downward taper" of your dose. For example: if you normally take four (4) pills per day, you will be asked to drop this dose to three (3) pills per day for seven (7) days, then to two (2) pills per day for seven (7) days, then to one (1) per day for seven (7) days, and at the end of those last seven (7) days, this is when the "Drug Holiday" would start.   Will I have withdrawals? By doing a "slow downward taper" like this one, it is unlikely that you will experience any significant withdrawal symptoms. Typically, what triggers withdrawals is the sudden stop of a high  dose opioid therapy. Withdrawals can usually be avoided by slowly decreasing the dose over a prolonged period of time. If you do not follow these instructions and decide to stop your medication abruptly, withdrawals may be possible.  What are withdrawals? Withdrawals: refers to the wide range of symptoms that occur after stopping or dramatically reducing opiate drugs after heavy and prolonged use. Withdrawal symptoms do not occur to patients that use low dose opioids, or those who take the medication sporadically. Contrary to benzodiazepine (example: Valium, Xanax, etc.) or alcohol withdrawals ("Delirium Tremens"), opioid withdrawals are not lethal. Withdrawals are the physical manifestation of the body getting rid of the excess receptors.  Expected Symptoms Early symptoms of withdrawal may include: . Agitation . Anxiety . Muscle aches . Increased tearing . Insomnia . Runny nose . Sweating . Yawning  Late symptoms of withdrawal may include: . Abdominal cramping . Diarrhea . Dilated pupils . Goose bumps . Nausea . Vomiting  Will I experience withdrawals? Due to the slow nature of the taper, it is very unlikely that you will experience any.  What is a slow taper? Taper: refers to the gradual decrease in dose.  (Last update: 03/22/2020) ____________________________________________________________________________________________     ______________________________________________________________________________________________  Specialty Pain Scale  Introduction:  There are significant differences in how pain is reported. The word pain usually refers to physical pain, but it is also a common synonym of suffering. The medical community uses a scale from 0 (zero) to 10 (ten) to report pain level. Zero (0) is described  as "no pain", while ten (10) is described as "the worse pain you can imagine". The problem with this scale is that physical pain is reported along with suffering.  Suffering refers to mental pain, or more often yet it refers to any unpleasant feeling, emotion or aversion associated with the perception of harm or threat of harm. It is the psychological component of pain.  Pain Specialists prefer to separate the two components. The pain scale used by this practice is the Verbal Numerical Rating Scale (VNRS-11). This scale is for the physical pain only. DO NOT INCLUDE how your pain psychologically affects you. This scale is for adults 57 years of age and older. It has 11 (eleven) levels. The 1st level is 0/10. This means: "right now, I have no pain". In the context of pain management, it also means: "right now, my physical pain is under control with the current therapy".  General Information:  The scale should reflect your current level of pain. Unless you are specifically asked for the level of your worst pain, or your average pain. If you are asked for one of these two, then it should be understood that it is over the past 24 hours.  Levels 1 (one) through 5 (five) are described below, and can be treated as an outpatient. Ambulatory pain management facilities such as ours are more than adequate to treat these levels. Levels 6 (six) through 10 (ten) are also described below, however, these must be treated as a hospitalized patient. While levels 6 (six) and 7 (seven) may be evaluated at an urgent care facility, levels 8 (eight) through 10 (ten) constitute medical emergencies and as such, they belong in a hospital's emergency department. When having these levels (as described below), do not come to our office. Our facility is not equipped to manage these levels. Go directly to an urgent care facility or an emergency department to be evaluated.  Definitions:  Activities of Daily Living (ADL): Activities of daily living (ADL or ADLs) is a term used in healthcare to refer to people's daily self-care activities. Health professionals often use a person's ability or inability  to perform ADLs as a measurement of their functional status, particularly in regard to people post injury, with disabilities and the elderly. There are two ADL levels: Basic and Instrumental. Basic Activities of Daily Living (BADL  or BADLs) consist of self-care tasks that include: Bathing and showering; personal hygiene and grooming (including brushing/combing/styling hair); dressing; Toilet hygiene (getting to the toilet, cleaning oneself, and getting back up); eating and self-feeding (not including cooking or chewing and swallowing); functional mobility, often referred to as "transferring", as measured by the ability to walk, get in and out of bed, and get into and out of a chair; the broader definition (moving from one place to another while performing activities) is useful for people with different physical abilities who are still able to get around independently. Basic ADLs include the things many people do when they get up in the morning and get ready to go out of the house: get out of bed, go to the toilet, bathe, dress, groom, and eat. On the average, loss of function typically follows a particular order. Hygiene is the first to go, followed by loss of toilet use and locomotion. The last to go is the ability to eat. When there is only one remaining area in which the person is independent, there is a 62.9% chance that it is eating and only a 3.5% chance that it is  hygiene. Instrumental Activities of Daily Living (IADL or IADLs) are not necessary for fundamental functioning, but they let an individual live independently in a community. IADL consist of tasks that include: cleaning and maintaining the house; home establishment and maintenance; care of others (including selecting and supervising caregivers); care of pets; child rearing; managing money; managing financials (investments, etc.); meal preparation and cleanup; shopping for groceries and necessities; moving within the community; safety procedures  and emergency responses; health management and maintenance (taking prescribed medications); and using the telephone or other form of communication.  Instructions:  Most patients tend to report their pain as a combination of two factors, their physical pain and their psychosocial pain. This last one is also known as "suffering" and it is reflection of how physical pain affects you socially and psychologically. From now on, report them separately.  From this point on, when asked to report your pain level, report only your physical pain. Use the following table for reference.  Pain Clinic Pain Levels (0-5/10)  Pain Level Score  Description  No Pain 0   Mild pain 1 Nagging, annoying, but does not interfere with basic activities of daily living (ADL). Patients are able to eat, bathe, get dressed, toileting (being able to get on and off the toilet and perform personal hygiene functions), transfer (move in and out of bed or a chair without assistance), and maintain continence (able to control bladder and bowel functions). Blood pressure and heart rate are unaffected. A normal heart rate for a healthy adult ranges from 60 to 100 bpm (beats per minute).   Mild to moderate pain 2 Noticeable and distracting. Impossible to hide from other people. More frequent flare-ups. Still possible to adapt and function close to normal. It can be very annoying and may have occasional stronger flare-ups. With discipline, patients may get used to it and adapt.   Moderate pain 3 Interferes significantly with activities of daily living (ADL). It becomes difficult to feed, bathe, get dressed, get on and off the toilet or to perform personal hygiene functions. Difficult to get in and out of bed or a chair without assistance. Very distracting. With effort, it can be ignored when deeply involved in activities.   Moderately severe pain 4 Impossible to ignore for more than a few minutes. With effort, patients may still be able to  manage work or participate in some social activities. Very difficult to concentrate. Signs of autonomic nervous system discharge are evident: dilated pupils (mydriasis); mild sweating (diaphoresis); sleep interference. Heart rate becomes elevated (>115 bpm). Diastolic blood pressure (lower number) rises above 100 mmHg. Patients find relief in laying down and not moving.   Severe pain 5 Intense and extremely unpleasant. Associated with frowning face and frequent crying. Pain overwhelms the senses.  Ability to do any activity or maintain social relationships becomes significantly limited. Conversation becomes difficult. Pacing back and forth is common, as getting into a comfortable position is nearly impossible. Pain wakes you up from deep sleep. Physical signs will be obvious: pupillary dilation; increased sweating; goosebumps; brisk reflexes; cold, clammy hands and feet; nausea, vomiting or dry heaves; loss of appetite; significant sleep disturbance with inability to fall asleep or to remain asleep. When persistent, significant weight loss is observed due to the complete loss of appetite and sleep deprivation.  Blood pressure and heart rate becomes significantly elevated. Caution: If elevated blood pressure triggers a pounding headache associated with blurred vision, then the patient should immediately seek attention at an urgent or emergency  care unit, as these may be signs of an impending stroke.    Emergency Department Pain Levels (6-10/10)  Emergency Room Pain 6 Severely limiting. Requires emergency care and should not be seen or managed at an outpatient pain management facility. Communication becomes difficult and requires great effort. Assistance to reach the emergency department may be required. Facial flushing and profuse sweating along with potentially dangerous increases in heart rate and blood pressure will be evident.   Distressing pain 7 Self-care is very difficult. Assistance is required to  transport, or use restroom. Assistance to reach the emergency department will be required. Tasks requiring coordination, such as bathing and getting dressed become very difficult.   Disabling pain 8 Self-care is no longer possible. At this level, pain is disabling. The individual is unable to do even the most "basic" activities such as walking, eating, bathing, dressing, transferring to a bed, or toileting. Fine motor skills are lost. It is difficult to think clearly.   Incapacitating pain 9 Pain becomes incapacitating. Thought processing is no longer possible. Difficult to remember your own name. Control of movement and coordination are lost.   The worst pain imaginable 10 At this level, most patients pass out from pain. When this level is reached, collapse of the autonomic nervous system occurs, leading to a sudden drop in blood pressure and heart rate. This in turn results in a temporary and dramatic drop in blood flow to the brain, leading to a loss of consciousness. Fainting is one of the body's self defense mechanisms. Passing out puts the brain in a calmed state and causes it to shut down for a while, in order to begin the healing process.    Summary: 1.   Refer to this scale when providing [place] with your pain level. 2.   Be accurate and careful when reporting your pain level. This will help with your care. 3.   Over-reporting your pain level will lead to loss of credibility. 4.   Even a level of 1/10 means that there is pain and will be treated at our facility. 5.   High, inaccurate reporting will be documented as "Symptom Exaggeration", leading to loss of credibility and suspicions of possible secondary gains such as obtaining more narcotics, or wanting to appear disabled, for fraudulent reasons. 6.   Only pain levels of 5 or below will be seen at our facility. 7.   Pain levels of 6 and above will be sent to the Emergency Department and the appointment  cancelled.  ______________________________________________________________________________________________

## 2021-01-04 ENCOUNTER — Ambulatory Visit
Admission: RE | Admit: 2021-01-04 | Discharge: 2021-01-04 | Disposition: A | Discharge status: Home / Self Care | Payer: Medicare Other | Source: Ambulatory Visit | Attending: Pain Medicine | Admitting: Pain Medicine

## 2021-01-04 DIAGNOSIS — M25511 Pain in right shoulder: Secondary | ICD-10-CM | POA: Diagnosis not present

## 2021-01-04 DIAGNOSIS — G8929 Other chronic pain: Secondary | ICD-10-CM | POA: Diagnosis not present

## 2021-01-04 DIAGNOSIS — M25512 Pain in left shoulder: Secondary | ICD-10-CM | POA: Diagnosis not present

## 2021-01-04 DIAGNOSIS — Z96612 Presence of left artificial shoulder joint: Secondary | ICD-10-CM | POA: Diagnosis not present

## 2021-01-04 DIAGNOSIS — M19011 Primary osteoarthritis, right shoulder: Secondary | ICD-10-CM | POA: Diagnosis not present

## 2021-01-04 DIAGNOSIS — Z471 Aftercare following joint replacement surgery: Secondary | ICD-10-CM | POA: Diagnosis not present

## 2021-01-05 ENCOUNTER — Telehealth: Payer: Self-pay | Admitting: Primary Care

## 2021-01-05 NOTE — Telephone Encounter (Signed)
Pt called in and stated that she has 1 more refill of the Doxycycline ,  [facility] called and stated she needed an approval before they can refill it.   Please advise

## 2021-01-08 NOTE — Telephone Encounter (Signed)
Should she be getting refill of doxycycline?

## 2021-01-08 NOTE — Telephone Encounter (Signed)
Called patient she was informed that is not something we refill. Asked if she was sill having symptoms states that cough is better but not gone away. Asked if she wanted to make appointment for evaluation but she has declined at this time has appointment with pulmonology next week. If any changes she will give our office a call.

## 2021-01-08 NOTE — Telephone Encounter (Signed)
Left message to return call to our office.  

## 2021-01-08 NOTE — Telephone Encounter (Signed)
I'm not sure what she is referring to, I treated her for acute symptoms on 12/20/20. Does she get this routinely from her pulmonologist?  No, we do not refill antibiotics.

## 2021-01-10 LAB — 25-HYDROXY VITAMIN D LCMS D2+D3
25-Hydroxy, Vitamin D-2: <1 ng/mL
25-Hydroxy, Vitamin D-3: 42 ng/mL
25-Hydroxy, Vitamin D: 42 ng/mL

## 2021-01-10 LAB — SEDIMENTATION RATE: Sed Rate: 13 mm/hr (ref 0–40)

## 2021-01-10 LAB — VITAMIN B12: Vitamin B-12: 676 pg/mL (ref 232–1245)

## 2021-01-10 LAB — C-REACTIVE PROTEIN: CRP: <1 mg/L (ref 0–10)

## 2021-01-15 DIAGNOSIS — S32010S Wedge compression fracture of first lumbar vertebra, sequela: Secondary | ICD-10-CM | POA: Insufficient documentation

## 2021-01-15 DIAGNOSIS — S22080S Wedge compression fracture of T11-T12 vertebra, sequela: Secondary | ICD-10-CM | POA: Insufficient documentation

## 2021-01-15 DIAGNOSIS — S32030S Wedge compression fracture of third lumbar vertebra, sequela: Secondary | ICD-10-CM | POA: Insufficient documentation

## 2021-01-15 DIAGNOSIS — R937 Abnormal findings on diagnostic imaging of other parts of musculoskeletal system: Secondary | ICD-10-CM | POA: Insufficient documentation

## 2021-01-15 NOTE — Progress Notes (Signed)
PROVIDER NOTE: Information contained herein reflects review and annotations entered in association with encounter. Interpretation of such information and data should be left to medically-trained personnel. Information provided to patient can be located elsewhere in the medical record under "Patient Instructions". Document created using STT-dictation technology, any transcriptional errors that may result from process are unintentional.    Patient: [name]  Service Category: E/M  Provider: Gaspar Cola, MD  DOB: 1936-05-05  DOS: 01/16/2021  Specialty: Interventional Pain Management  MRN: [id number]  Setting: Ambulatory outpatient  PCP: [name], NP  Type: Established Patient    Referring Provider: [name], NP  Location: Office  Delivery: Face-to-face     Primary Reason(s) for Visit: Encounter for evaluation before starting new chronic pain management plan of care (Level of risk: moderate) CC: Shoulder Pain (bilateral) and Back Pain (lower)  HPI  [name] is a 85 y.o. year old, female patient, who comes today for a follow-up evaluation to review the test results and decide on a treatment plan. She has Anxiety; Arthritis; Compression fracture of L4 lumbar vertebra, sequela; Idiopathic pulmonary fibrosis ([place]); Postinflammatory pulmonary fibrosis ([place]); Osteopetrosis; Family history of colon cancer requiring screening colonoscopy; Vitamin D deficiency; Fatigue; Osteopenia; Vitamin B 12 deficiency; COVID-19 virus infection; Subcapital fracture of femur, sequela (Right); C. 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 Her primarily concern today is the Shoulder Pain (bilateral) and Back Pain (lower)  Pain Assessment: Location: Lower Back Radiating: left leg Onset: More than a month ago Duration: Chronic pain Quality: Aching,Dull Severity: 7 /10 (subjective, self-reported pain score)  Effect on ADL:   Timing: Intermittent Modifying factors: medicaitons, lying down, sitting in recliner, heat BP: (!) 129/91  HR: 67  [name] comes in today for a follow-up visit after her initial evaluation on 01/03/2021. Today we went over the results of her tests. These were explained in "Layman's terms". During today's appointment we went over my diagnostic impression, as well as the proposed treatment plan.  Precharting review of initial visit: According to the patient the primary area of pain is that of the right groin.  She indicates having had a right hip replacement by Dr. Rudene Christians on 11/08/2020.  She denied physical therapy, prior nerve blocks or joint injections, but she did indicate having had an x-ray that is less than 80 years old.  The patient's second area pain is that of the lower back (Left) (Midline).  The patient denied any pain in the right side of the lower back.  She does have a history of vertebral body fractures with kyphoplasty x3.  She has been treated at [facility] by Dr. Wynn Banker.  She indicates having had nerve blocks in her back by Dr. Nilda Simmer, and Dr. Sharlet Salina, among others.  She indicates having had physical [facility] at [facility] and having  had fairly recent x-rays and/or MRIs.  The patient's  third area of pain is that of the shoulders (Bilateral) (L>R).  The patient indicates having had a right shoulder rotator cuff surgery on 2010, a left total arthroplasty reversal on 2012 and a left rotator cuff surgery around 2011.  She admits to having had physical therapy but denies any recent x-rays.  She denies any nerve blocks or joint injections.  The patient indicates that she has been a patient of [facility] where she was seen Dr. Kerry Dory for medications and injections, but he left hand she is not connected well with the replacement (nurse practitioner).  She refers that she used to take hydromorphone 4 mg p.o. 3 times daily which worked well in controlling her pain but she was then switched to hydrocodone/APAP 7.5/325 1 tablet p.o. 3 times daily and recently increased to 4 times daily.  She also takes diclofenac and citalopram for anxiety.  She indicates having been on opioids for many years, nonstop.  She reports having had side effects to the use of meloxicam where she developed some peptic ulcers.  Studies ordered on initial evaluation: Lab      Compliance Drug Analysis, Ur (pending)     Vitamin B12 (WNL)     Sedimentation rate (WNL)     25-Hydroxy vitamin D Lcms D2+D3 (WNL)     C-reactive protein (WNL)  Imaging      DG Shoulder Right -(impression: No acute abnormalities.  Irregularity of the lateral humeral head, likely due to reported rotator cuff pathology.  A/C joint DJD changes.)     DG Shoulder Left - (impression: Left shoulder replacement.  No acute abnormalities to explain the patient's pain.)  Pharmacologic review: According to the [place] PMP, this patient has been taking hydromorphone 4 mg, 1 tab p.o. 3 times daily (48 MME), nonstop since at least 01/22/2019.  This was being prescribed by Rhys Martini, DO [facility] PMR physician).  At one point, her physician switched her to hydromorphone 2 mg, 1 tab p.o. 4 times daily  (32 MME), and then put her back at the same dose that she was before.  More recently the patient was switched to hydrocodone/APAP 7.5/324, 1 tab p.o. 3 times daily (22.5 MME/day) and then recently increased to 4 times daily (30 MME).  Prior procedures: Right L4-5 transforaminal ESI (12/08/2020) by Dr. Sharlet Salina [facility] procedures are unavailable.  Today, as the patient returns to the clinic she indicates that the groin pain that used to be her primary pain has subsided and right now what is giving her the most amount of pain is that of the lower back (Bilateral) (L>R).  Today I went over the results of her lab work as well as the imaging and I have explained all of them to the patient in layman's terms.  Based on her current symptoms and findings, I have explained to the patient that I believe her to be having a lumbar facet syndrome.  I have also explained to the patient the plan of care which consist of bringing her back and doing a diagnostic bilateral lumbar facet block under fluoroscopic guidance and IV sedation.  I have also explained to the patient that should that worked well for her, we may consider radiofrequency ablation.  We also had a very extensive conversation regarding the medications and we talked about the fact that she has developed tolerance to these medicines.  I have explained to the patient that in order to get rid of this tolerance we will need to do a "Drug Holiday".  I took the opportunity to explain the concept of tolerance and how "Drug Holidays" help control it.  I detailed how to do a slow taper to avoid withdrawals.  She understood and accepted and she seems to be on board with that plan.  However, she did receive 3 prescriptions for her current medication and we will continue to have her take those until she runs out at which time we may take over her medication regimen.  However, for some reason we were unable to get her urine drug screening test done today we will  be repeating that.  In considering the treatment plan options, [name] was reminded that I no longer take patients for medication management only. I asked her to let me know if she had no intention of taking advantage of the interventional therapies, so that we could make arrangements to provide this space to someone interested. I also made it clear that undergoing interventional therapies for the purpose of getting pain medications is very inappropriate on the part of a patient, and it will not be tolerated in this practice. This type of behavior would suggest true addiction and therefore it requires referral to an addiction specialist.   Further details on both, my assessment(s), as well as the proposed treatment plan, please see below.  Controlled Substance Pharmacotherapy Assessment REMS (Risk Evaluation and Mitigation Strategy)  Analgesic: Hydrocodone/APAP 7.5/325, 1 tab p.o. 3 times daily (22.5 mg/day of hydrocodone) (filled on 12/16/2020) (22.5 MME) MME/day: 22.5 mg/day  Pill Count: None expected due to no prior prescriptions written by our practice. Landis Martins, RN  01/16/2021 11:05 AM  Sign when Signing Visit Safety precautions to be maintained throughout the outpatient stay will include: orient to surroundings, keep bed in low position, maintain call bell within reach at all times, provide assistance with transfer out of bed and ambulation.    Pharmacokinetics: Liberation and absorption (onset of action): WNL Distribution (time to peak effect): WNL Metabolism and excretion (duration of action): WNL         Pharmacodynamics: Desired effects: Analgesia: [name] reports >50% benefit. Functional ability: Patient reports that medication allows her to accomplish basic ADLs Clinically meaningful improvement in function (CMIF): Sustained CMIF goals met Perceived effectiveness: Described as relatively effective, allowing for increase in activities of daily living  (ADL) Undesirable effects: Side-effects or Adverse reactions: None reported Monitoring: [place] PMP: PDMP reviewed during this encounter. Online review of the past 11-monthperiod previously conducted. Not applicable at this point since we have not taken over the patient's medication management yet. List of other Serum/Urine Drug Screening Test(s):  No results found for: AMPHSCRSER, BARBSCRSER, BENZOSCRSER, COCAINSCRSER, COCAINSCRNUR, [place] EBironList of all UDS test(s) done:  No results found for: TOXASSSELUR, SUMMARY Last UDS on record: No results found for: TOXASSSELUR, SUMMARY UDS interpretation: No unexpected findings.          Medication Assessment Form: Patient introduced to form today Treatment compliance: Treatment may start today if patient agrees with proposed plan. Evaluation of compliance is not applicable at this point Risk Assessment Profile: Aberrant behavior: See initial evaluations. None observed or detected today Comorbid factors increasing risk of overdose: See initial evaluation. No additional risks detected today Opioid risk tool (ORT):  Opioid Risk  01/03/2021  Alcohol 0  Illegal Drugs 0  Rx Drugs 0  Alcohol 0  Illegal Drugs 0  Rx Drugs 0  Age between 16-45 years  0  Psychological Disease 2  ADD Negative  OCD Negative  Bipolar Negative  Depression 1  Opioid Risk Tool Scoring 3  Opioid Risk Interpretation Low Risk    ORT Scoring interpretation table:  Score <3 = Low Risk for SUD  Score between 4-7 = Moderate Risk for SUD  Score >8 = High Risk for Opioid Abuse   Risk of substance use disorder (SUD): Low  Risk Mitigation Strategies:  Patient opioid safety counseling: Completed today. Counseling provided to patient as per "Patient Counseling Document". Document signed by patient, attesting to counseling and understanding Patient-Prescriber Agreement (PPA): Obtained today.  Controlled  substance notification to other providers: Written and sent today.  Pharmacologic Plan: Today we may be taking over the patient's pharmacological regimen. See below.             Laboratory Chemistry Profile   Renal Lab Results  Component Value Date   BUN 22 10/24/2020   CREATININE 0.74 10/24/2020   BCR 23 09/21/2020   GFR 75.35 10/24/2020   GFRAA 76 09/21/2020   GFRNONAA >60 09/29/2020   SPECGRAV 1.025 02/16/2020   PHUR 7.0 02/16/2020   PROTEINUR NEGATIVE 05/27/2020     Electrolytes Lab Results  Component Value Date   NA 140 10/24/2020   K 4.6 10/24/2020   CL 102 10/24/2020   CALCIUM 10.0 10/24/2020   MG 2.1 09/29/2020   PHOS 3.3 05/28/2020     Hepatic Lab Results  Component Value Date   AST 31 09/26/2020   ALT 18 09/26/2020   ALBUMIN 3.6 09/26/2020   ALKPHOS 58 09/26/2020     ID Lab Results  Component Value Date   SARSCOV2NAA NEGATIVE 10/02/2020     Bone Lab Results  Component Value Date   VD25OH 37.79 10/24/2020   25OHVITD1 42 01/03/2021   25OHVITD2 <1.0 01/03/2021   25OHVITD3 42 01/03/2021     Endocrine Lab Results  Component Value Date   GLUCOSE 88 10/24/2020   GLUCOSEU NEGATIVE 05/27/2020   HGBA1C 6.1 02/09/2014   TSH 1.53 03/15/2020     Neuropathy Lab Results  Component Value Date   VITAMINB12 676 01/03/2021   HGBA1C 6.1 02/09/2014     CNS No results found for: COLORCSF, APPEARCSF, RBCCOUNTCSF, WBCCSF, POLYSCSF, LYMPHSCSF, EOSCSF, PROTEINCSF, GLUCCSF, JCVIRUS, CSFOLI, IGGCSF, LABACHR, ACETBL, LABACHR, ACETBL   Inflammation (CRP: Acute  ESR: Chronic) Lab Results  Component Value Date   CRP <1 01/03/2021   ESRSEDRATE 13 01/03/2021     Rheumatology No results found for: RF, ANA, LABURIC, URICUR, LYMEIGGIGMAB, LYMEABIGMQN, HLAB27   Coagulation Lab Results  Component Value Date   PLT 307.0 10/24/2020     Cardiovascular Lab Results  Component Value Date   BNP 69.1 05/29/2020   TROPONINI <0.03 10/07/2018   HGB 12.8 10/24/2020    HCT 39.3 10/24/2020     Screening Lab Results  Component Value Date   SARSCOV2NAA NEGATIVE 10/02/2020     Cancer Lab Results  Component Value Date   CEA 3.4 02/03/2017     Allergens No results found for: ALMOND, APPLE, ASPARAGUS, AVOCADO, BANANA, BARLEY, BASIL, BAYLEAF, GREENBEAN, LIMABEAN, WHITEBEAN, BEEFIGE, REDBEET, BLUEBERRY, BROCCOLI, CABBAGE, MELON, CARROT, CASEIN, CASHEWNUT, CAULIFLOWER, CELERY     Note: Lab results reviewed.  Recent Diagnostic Imaging Review  Cervical Imaging: Cervical DG complete: Results for orders placed during the hospital encounter of 11/12/17 DG Cervical Spine Complete  Narrative CLINICAL DATA:  Left-sided neck pain.  Headache.  EXAM: CERVICAL SPINE - COMPLETE 4+ VIEW  COMPARISON:  02/08/2014.  FINDINGS: Diffuse severe osteopenia  degenerative change. Straightening of the cervical spine noted. C5-C6 and C6-C7 disc degeneration with endplate osteophyte formation. Mild bilateral neural foraminal narrowing secondary to osteophyte formation noted. Bilateral carotid vascular calcification.  IMPRESSION: 1. Diffuse osteopenia and degenerative change. Mild bilateral neural foraminal narrowing noted secondary osteophyte formation. No acute abnormality.  2.  Bilateral carotid atherosclerotic vascular disease.   Electronically Signed By: Marcello Moores  Register On: 11/13/2017 07:49  Shoulder Imaging: Gaston Islam DG: Results for orders placed during the hospital encounter of 01/04/21 DG Shoulder Right  Narrative CLINICAL DATA:  Chronic bilateral shoulder pain. History of bilateral cuff repair.  EXAM: RIGHT SHOULDER - 2+ VIEW  COMPARISON:  None.  FINDINGS: AC joint degenerative changes. Irregularity of the lateral humeral head, likely due to history of rotator cuff pathology. A soft tissue calcification overlying the lateral humeral head is relatively superficial and is of doubtful acute significance. No fracture or dislocation. Limited  views of the left chest are normal. No other acute abnormalities.  IMPRESSION: No acute abnormalities. Irregularity of the lateral humeral head, likely due to reported rotator cuff pathology. AC joint degenerative changes.   Electronically Signed By: Dorise Bullion III M.D On: 01/07/2021 16:40  Shoulder-L DG: Results for orders placed during the hospital encounter of 01/04/21 DG Shoulder Left  Narrative CLINICAL DATA:  Pain.  EXAM: LEFT SHOULDER - 2+ VIEW  COMPARISON:  None.  FINDINGS: The patient is status post total left shoulder replacement. Glenoid and humeral components are in good position. No dislocation. No hardware failure identified. No fracture.  IMPRESSION: Left shoulder replacement. No acute abnormality to explain the patient's pain.   Electronically Signed By: Dorise Bullion III M.D On: 01/07/2021 16:41  Lumbosacral Imaging: Lumbar MR wo contrast: Results for orders placed during the hospital encounter of 12/06/20 MR LUMBAR SPINE WO CONTRAST  Narrative CLINICAL DATA:  Acute right-sided low back pain with right-sided sciatica.  EXAM: MRI LUMBAR SPINE WITHOUT CONTRAST  TECHNIQUE: Multiplanar, multisequence MR imaging of the lumbar spine was performed. No intravenous contrast was administered.  COMPARISON:  Radiographs April 20, 2018.  FINDINGS: Segmentation:  Standard.  Alignment: Levoconvex scoliosis of the lumbar spine. Small anterolisthesis of L4 over L5.  Vertebrae: No acute fracture, evidence of discitis, or bone lesion. Chronic compression fractures of the T12, L1 and L4 vertebral bodies status post vertebral augmentation. Chronic compression fracture of the L3 vertebral body. There is small retropulsion of the superior aspect of the T12 posterior wall into the spinal canal causing indentation on the thecal sac without significant spinal canal stenosis.  Conus medullaris and cauda equina: Conus extends to the L1 level. Conus  and cauda equina appear normal.  Paraspinal and other soft tissues: Negative.  Disc levels:  T12-L1: No spinal canal or neural foraminal stenosis.  L1-2: Shallow disc bulge and mild facet degenerative change resulting in mild bilateral neural foraminal narrowing. No spinal canal stenosis.  L2-3: Shallow disc bulge and mild facet degenerative change resulting in mild-to-moderate right mild left neural foraminal narrowing.  L3-4: Loss of disc height, disc bulge and moderate facet degenerative changes resulting in severe right and moderate left neural foraminal narrowing. No significant spinal canal stenosis.  L4-5: Shallow disc bulge/disc uncovering and prominent hypertrophic facet degenerative changes with ligamentum flavum redundancy resulting mild spinal canal stenosis. No significant neural foraminal narrowing.  L5-S1: Moderate right and advanced left hypertrophic facet degenerative changes without significant spinal canal or neural foraminal stenosis.  IMPRESSION: 1. No acute abnormality of the lumbar spine. 2. Chronic compression  fractures of the T12, L1 and L4 vertebral bodies status post vertebral augmentation. Chronic compression fracture of the L3 vertebral body. 3. Multilevel degenerative changes of the lumbar spine, more pronounced at the facet joints in the lower lumbar spine with mild spinal canal stenosis at L4-L5. 4. Severe right and moderate left neural foraminal narrowing at L3-4.   Electronically Signed By: Pedro Earls M.D. On: 12/06/2020 17:53  Results for orders placed during the hospital encounter of 04/19/06 MR Lumbar Spine Wo Contrast  Narrative Clinical Data: Severe back pain. Right hip and leg pain. Fall two months ago. MRI LUMBAR SPINE WITHOUT CONTRAST: Technique: Multiplanar and multiecho pulse sequences of the lumbar spine, to include the lower thoracic and upper sacral regions, were obtained according to standard  protocol without IV contrast. Findings: Alignment of the spine is normal. There is no disc pathology. The patient has a subacute compression fracture at L-1 with loss of height of about 50%. This primarily affects the superior end plate. There is no retropulsed bone. There is facet degeneration at L4-5 and L5-S1, more pronounced at L4-5. This could be a cause of the low back pain. No sign of neural compression.  Impression 1. Acute/subacute compression fracture of L-1 with loss of height of about 50%. No retropulsion or compromise of the neural structures. 2. Facet arthropathy in the lower lumbar spine, worse at L4-5 and L5-S1. No sign of neural compression.  Provider: [name]  Lumbar DG (Complete) 4+V: Results for orders placed during the hospital encounter of 04/20/18 DG Lumbar Spine Complete  Narrative CLINICAL DATA:  Patient fell outside on concrete steps. Low back pain.  EXAM: LUMBAR SPINE - COMPLETE 4+ VIEW  COMPARISON:  12/10/2013 [facility], MRI 03/09/2009  FINDINGS: Diffuse generalized osteopenia of the included thoracolumbar spine. Vertebral augmentation at L1 and L4. Moderate 50% height loss of L1 is seen on the lateral projection, stable since prior MRI. Superior endplate depression of L3 is stable. There is moderate degenerative disc flattening consistent with degenerative disc disease L3-4. Lower lumbar facet arthropathy is identified along the entirety of the lumbar spine greatest from L3 through S1.  IMPRESSION: 1. Osteopenic appearance of the included lower thoracic and lumbar spine with vertebral augmentation at L1 and L4. 2. Chronic compression deformities L1 and superior endplate of L3 are stable. 3. Lumbar facet arthropathy is identified L3 through S1.   Electronically Signed By: Ashley Royalty M.D. On: 04/20/2018 17:58  Knee Imaging: Knee-R DG 4 views: Results for orders placed during the hospital encounter of 04/20/18 DG Knee Complete 4 Views  Right  Narrative CLINICAL DATA:  Patient fell outside on concrete steps. Skin tear of left lower leg and abrasions both knees.  EXAM: RIGHT KNEE - COMPLETE 4+ VIEW  COMPARISON:  None.  FINDINGS: There is a moderate-sized suprapatellar joint effusion associated with acute transverse fractures of the mid patella without distraction of the fracture fragments. Shallow depression of the lateral tibial plateau suggested as well suspicious for a lateral tibial plateau fracture. Infrapatellar anterior soft tissue laceration is identified. No fracture of the included femur. Intact included fibula.  IMPRESSION: 1. Acute, closed, transverse fractures of the mid patella without distraction of the fracture fragments. 2. Moderate suprapatellar joint effusion. 3. Suggestion of mild shallow depression of the lateral tibial plateau potentially representing a Schatzker type 3 lateral tibial plateau fracture with slight depression of the tibial plateau.   Electronically Signed By: Ashley Royalty M.D. On: 04/20/2018 17:51  Foot Imaging: Foot-L  DG Complete: Results for orders placed during the hospital encounter of 09/05/18 DG Foot Complete Left  Narrative CLINICAL DATA:  LEFT foot pain, no injury  EXAM: LEFT FOOT - COMPLETE 3+ VIEW  COMPARISON:  04/17/2017  FINDINGS: Osseous demineralization.  Deformity of the distal first metatarsal from remote osteotomy.  Degenerative changes first MTP joint and at TMT joints.  Remain joint spaces preserved.  No acute fracture, dislocation, or bone destruction.  Mild soft tissue swelling overlying the dorsum of the foot at the level of the distal metatarsals.  Small vessel vascular calcifications at ankle.  IMPRESSION: Scattered degenerative changes as above.  No acute abnormalities.   Electronically Signed By: Lavonia Dana M.D. On: 09/05/2018 20:05  Complexity Note: Imaging results reviewed. Results shared with [name], using  Layman's terms.              Meds   Current Outpatient Medications:  .  Cholecalciferol (VITAMIN D3) 125 MCG (5000 UT) CAPS, Take 5,000 Int'l Units by mouth., Disp: , Rfl:  .  citalopram (CELEXA) 40 MG tablet, TAKE 1 TABLET (40 MG TOTAL) BY MOUTH DAILY. FOR ANXIETY., Disp: 90 tablet, Rfl: 1 .  denosumab (PROLIA) 60 MG/ML SOSY injection, Inject 60 mg into the skin every 6 (six) months., Disp: , Rfl:  .  ESBRIET 267 MG TABS, Take 267 mg by mouth 3 (three) times daily with meals., Disp: , Rfl:  .  HYDROcodone-acetaminophen (NORCO) 7.5-325 MG tablet, Take 1 tablet by mouth 3 (three) times daily as needed., Disp: , Rfl:  .  OXYGEN, Inhale into the lungs. 2 L at night, Disp: , Rfl:  .  sulfaSALAzine (AZULFIDINE) 500 MG tablet, Take 1 tablet (500 mg total) by mouth 2 (two) times daily., Disp: 180 tablet, Rfl: 1 .  vitamin B-12 (CYANOCOBALAMIN) 1000 MCG tablet, Take 1,000 mcg by mouth every other day., Disp: , Rfl:  .  VOLTAREN 1 % GEL, Apply 2 g topically in the morning, at noon, and at bedtime. Shoulders and back, Disp: , Rfl: 2  ROS  Constitutional: Denies any fever or chills Gastrointestinal: No reported hemesis, hematochezia, vomiting, or acute GI distress Musculoskeletal: Denies any acute onset joint swelling, redness, loss of ROM, or weakness Neurological: No reported episodes of acute onset apraxia, aphasia, dysarthria, agnosia, amnesia, paralysis, loss of coordination, or loss of consciousness  Allergies  [name] has No Known Allergies.  [place]  Drug: [name]  reports no history of drug use. Alcohol:  reports no history of alcohol use. Tobacco:  reports that she has never smoked. She has never used smokeless tobacco. 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 Surgical: [name]  has a past surgical history that includes Rotator cuff repair (Bilateral); Total shoulder replacement (Left); Cataract extraction w/PHACO (Left, 04/24/2016); Cataract extraction w/PHACO (Right, 07/14/2017); Esophagogastroduodenoscopy (egd) with propofol (N/A, 10/13/2018); Flexible sigmoidoscopy (N/A, 10/13/2018); Cholecystectomy; Tonsillectomy; Back surgery; Flexible sigmoidoscopy (N/A, 09/09/2019); Flexible sigmoidoscopy (N/A, 08/04/2020); and Hip Arthroplasty (Right, 09/27/2020). Family: family history includes COPD in her mother; Cancer in her brother.  Constitutional Exam  General appearance: Well nourished,  well developed, and well hydrated. In no apparent acute distress Vitals:   01/16/21 1100  BP: (!) 129/91  Pulse: 67  Resp: 14  Temp: (!) 97.2 F (36.2 C)  TempSrc: Temporal  SpO2: 96%  Weight: 127 lb (57.6 kg)  Height: 5' (1.524 m)   BMI Assessment: Estimated body mass index is 24.8 kg/m as calculated from the following:   Height as of this encounter: 5' (1.524 m).   Weight as of this encounter: 127 lb (57.6 kg).  BMI interpretation table: BMI level Category Range association with higher incidence of chronic pain  <18 kg/m2 Underweight   18.5-24.9 kg/m2 Ideal body weight   25-29.9 kg/m2 Overweight Increased incidence by 20%  30-34.9 kg/m2 Obese (Class I) Increased incidence by 68%  35-39.9 kg/m2 Severe obesity (Class II) Increased incidence by 136%  >40 kg/m2 Extreme obesity (Class III) Increased incidence by 254%   Patient's current BMI Ideal Body weight  Body mass index is 24.8 kg/m. Ideal  body weight: 45.5 kg (100 lb 4.9 oz) Adjusted ideal body weight: 50.3 kg (110 lb 15.8 oz)   BMI Readings from Last 4 Encounters:  01/16/21 24.80 kg/m  01/03/21 24.02 kg/m  12/20/20 24.02 kg/m  10/24/20 24.12 kg/m   Wt Readings from Last 4 Encounters:  01/16/21 127 lb (57.6 kg)  01/03/21 123 lb (55.8 kg)  12/20/20 123 lb (55.8 kg)  10/24/20 123 lb 8 oz (56 kg)    Psych/Mental status: Alert, oriented x 3 (person, place, & time)       Eyes: PERLA Respiratory: No evidence of acute respiratory distress  Assessment & Plan  Primary Diagnosis & Pertinent Problem List: The primary encounter diagnosis was Chronic pain syndrome. 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  Visit Diagnosis: 1. Chronic pain syndrome   2. Chronic low back pain (1ry area of Pain) (Bilateral) (L>R) w/o sciatica   3. Lumbar facet syndrome   4. Lumbosacral facet arthropathy (Multilevel) (Bilateral)   5. DDD (degenerative disc disease), lumbosacral   6. Compression fracture of L1 lumbar vertebra, sequela   7. Compression fracture of L3 lumbar vertebra, sequela   8. Compression fracture of L4 lumbar  vertebra, sequela   9. Spondylosis without myelopathy or radiculopathy, lumbosacral region   10. Abnormal MRI, lumbar spine (12/06/2020)   11. Chronic groin pain (2ry area of Pain) (Right)   12. Chronic shoulder pain (3ry area of Pain) (Bilateral) (L>R)   13. 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 Subcapital fracture of femur, sequela (Right)   15. T12 compression fracture, sequela   16. DDD (degenerative disc disease), thoracolumbar   17. Pharmacologic therapy   18. Chronic use of opiate for therapeutic purpose    Problems updated and reviewed during this visit: Problem  Lumbosacral facet arthropathy (Multilevel) (Bilateral)  Lumbar facet syndrome  Spondylosis Without Myelopathy Or Radiculopathy,  Lumbosacral Region  Ddd (Degenerative Disc Disease), Lumbosacral  Ddd (Degenerative Disc Disease), Thoracolumbar  Chronic groin pain (2ry area of Pain) (Right)  Chronic low back pain (1ry area of Pain) (Bilateral) (L>R) w/o sciatica  Chronic Use of Opiate for Therapeutic Purpose  Urinary Incontinence (Resolved)  Mild Cognitive Impairment (Resolved)  Sleep Apnea (Resolved)  Hypercholesteremia (Resolved)  Cystocele With Uterine Descensus (Resolved)  Htn (Hypertension) (Resolved)    Plan of Care  Pharmacotherapy (Medications Ordered): No orders of the defined types were placed in this encounter.   Procedure Orders     LUMBAR FACET(MEDIAL BRANCH NERVE BLOCK) MBNB  Lab Orders     Compliance Drug Analysis, Ur Imaging Orders  No imaging studies ordered today   Referral Orders  No referral(s) requested today    Pharmacological management options:  Opioid Analgesics: We'll take over management today. See above orders Membrane stabilizer: Options discussed, including a trial. Muscle relaxant: We have discussed the possibility of a trial NSAID: Trial discussed. Other analgesic(s): To be determined at a later time     Interventional Therapies  Risk  Complexity  Considerations:   Estimated body mass index is 24.8 kg/m as calculated from the following:   Height as of this encounter: 5' (1.524 m).   Weight as of this encounter: 127 lb (57.6 kg). WNL   Planned  Pending:   Pending further evaluation   Under consideration:   Diagnostic right L2 and L3 transforaminal ESI #1  Diagnostic left lumbar facet block #1  Diagnostic left L4-5 LESI #1  Diagnostic left suprascapular NB #1  Possible left suprascapular nerve RFA #1  Diagnostic right IA glenohumeral and AC joint injection #1    Completed:   None at this time   Therapeutic  Palliative (PRN) options:   None established    Provider-requested follow-up: Return for Procedure (w/ sedation): (B) L-FCT BLK #1. Recent Visits Date Type Provider Dept  01/03/21 Office Visit Milinda Pointer, MD [facility]  Showing recent visits within past 90 days and meeting all other requirements Today's Visits Date Type Provider Dept  01/16/21 Office Visit Milinda Pointer, MD [facility]  Showing today's visits and meeting all other requirements Future Appointments Date Type Provider Dept  01/18/21 Appointment Milinda Pointer, MD [facility]  Showing future appointments within next 90 days and meeting all other requirements  Primary Care Physician: [name], NP Note by: Gaspar Cola, MD Date: 01/16/2021; Time: 12:38 PM

## 2021-01-16 ENCOUNTER — Other Ambulatory Visit: Payer: Self-pay

## 2021-01-16 ENCOUNTER — Ambulatory Visit: Payer: Medicare Other | Attending: Pain Medicine | Admitting: Pain Medicine

## 2021-01-16 ENCOUNTER — Encounter: Payer: Self-pay | Admitting: Pain Medicine

## 2021-01-16 VITALS — BP 129/91 | HR 67 | Temp 97.2°F | Resp 14 | Ht 60.0 in | Wt 127.0 lb

## 2021-01-16 DIAGNOSIS — G4733 Obstructive sleep apnea (adult) (pediatric): Secondary | ICD-10-CM | POA: Diagnosis not present

## 2021-01-16 DIAGNOSIS — M47816 Spondylosis without myelopathy or radiculopathy, lumbar region: Secondary | ICD-10-CM | POA: Insufficient documentation

## 2021-01-16 DIAGNOSIS — M47817 Spondylosis without myelopathy or radiculopathy, lumbosacral region: Secondary | ICD-10-CM | POA: Diagnosis not present

## 2021-01-16 DIAGNOSIS — S32030S Wedge compression fracture of third lumbar vertebra, sequela: Secondary | ICD-10-CM | POA: Diagnosis not present

## 2021-01-16 DIAGNOSIS — R1031 Right lower quadrant pain: Secondary | ICD-10-CM | POA: Diagnosis not present

## 2021-01-16 DIAGNOSIS — M545 Low back pain, unspecified: Secondary | ICD-10-CM | POA: Insufficient documentation

## 2021-01-16 DIAGNOSIS — Z79891 Long term (current) use of opiate analgesic: Secondary | ICD-10-CM | POA: Insufficient documentation

## 2021-01-16 DIAGNOSIS — M25551 Pain in right hip: Secondary | ICD-10-CM | POA: Diagnosis not present

## 2021-01-16 DIAGNOSIS — G8929 Other chronic pain: Secondary | ICD-10-CM | POA: Insufficient documentation

## 2021-01-16 DIAGNOSIS — M25511 Pain in right shoulder: Secondary | ICD-10-CM | POA: Insufficient documentation

## 2021-01-16 DIAGNOSIS — R937 Abnormal findings on diagnostic imaging of other parts of musculoskeletal system: Secondary | ICD-10-CM | POA: Diagnosis not present

## 2021-01-16 DIAGNOSIS — M5135 Other intervertebral disc degeneration, thoracolumbar region: Secondary | ICD-10-CM | POA: Insufficient documentation

## 2021-01-16 DIAGNOSIS — S22080S Wedge compression fracture of T11-T12 vertebra, sequela: Secondary | ICD-10-CM | POA: Diagnosis not present

## 2021-01-16 DIAGNOSIS — M5137 Other intervertebral disc degeneration, lumbosacral region: Secondary | ICD-10-CM | POA: Insufficient documentation

## 2021-01-16 DIAGNOSIS — R0600 Dyspnea, unspecified: Secondary | ICD-10-CM | POA: Diagnosis not present

## 2021-01-16 DIAGNOSIS — M25512 Pain in left shoulder: Secondary | ICD-10-CM | POA: Insufficient documentation

## 2021-01-16 DIAGNOSIS — Z96641 Presence of right artificial hip joint: Secondary | ICD-10-CM | POA: Insufficient documentation

## 2021-01-16 DIAGNOSIS — S32010S Wedge compression fracture of first lumbar vertebra, sequela: Secondary | ICD-10-CM | POA: Diagnosis not present

## 2021-01-16 DIAGNOSIS — S32040S Wedge compression fracture of fourth lumbar vertebra, sequela: Secondary | ICD-10-CM | POA: Diagnosis not present

## 2021-01-16 DIAGNOSIS — J8410 Pulmonary fibrosis, unspecified: Secondary | ICD-10-CM | POA: Diagnosis not present

## 2021-01-16 DIAGNOSIS — G894 Chronic pain syndrome: Secondary | ICD-10-CM | POA: Diagnosis not present

## 2021-01-16 DIAGNOSIS — Z01818 Encounter for other preprocedural examination: Secondary | ICD-10-CM | POA: Diagnosis not present

## 2021-01-16 DIAGNOSIS — Z79899 Other long term (current) drug therapy: Secondary | ICD-10-CM | POA: Insufficient documentation

## 2021-01-16 DIAGNOSIS — S72011S Unspecified intracapsular fracture of right femur, sequela: Secondary | ICD-10-CM | POA: Insufficient documentation

## 2021-01-16 DIAGNOSIS — Z9981 Dependence on supplemental oxygen: Secondary | ICD-10-CM | POA: Diagnosis not present

## 2021-01-16 NOTE — Patient Instructions (Signed)
____________________________________________________________________________________________  Preparing for Procedure with Sedation  Procedure appointments are limited to planned procedures: . No Prescription Refills. . No disability issues will be discussed. . No medication changes will be discussed.  Instructions: . Oral Intake: Do not eat or drink anything for at least 8 hours prior to your procedure. (Exception: Blood Pressure Medication. See below.) . Transportation: Unless otherwise stated by your physician, you may drive yourself after the procedure. . Blood Pressure Medicine: Do not forget to take your blood pressure medicine with a sip of water the morning of the procedure. If your Diastolic (lower reading)is above 100 mmHg, elective cases will be cancelled/rescheduled. . Blood thinners: These will need to be stopped for procedures. Notify our staff if you are taking any blood thinners. Depending on which one you take, there will be specific instructions on how and when to stop it. . Diabetics on insulin: Notify the staff so that you can be scheduled 1st case in the morning. If your diabetes requires high dose insulin, take only  of your normal insulin dose the morning of the procedure and notify the staff that you have done so. . Preventing infections: Shower with an antibacterial soap the morning of your procedure. . Build-up your immune system: Take 1000 mg of Vitamin C with every meal (3 times a day) the day prior to your procedure. [place] Antibiotics: Inform the staff if you have a condition or reason that requires you to take antibiotics before dental procedures. . Pregnancy: If you are pregnant, call and cancel the procedure. . Sickness: If you have a cold, fever, or any active infections, call and cancel the procedure. . Arrival: You must be in the facility at least 30 minutes prior to your scheduled procedure. . Children: Do not bring children with you. . Dress appropriately:  Bring dark clothing that you would not mind if they get stained. . Valuables: Do not bring any jewelry or valuables.  Reasons to call and reschedule or cancel your procedure: (Following these recommendations will minimize the risk of a serious complication.) . Surgeries: Avoid having procedures within 2 weeks of any surgery. (Avoid for 2 weeks before or after any surgery). . Flu Shots: Avoid having procedures within 2 weeks of a flu shots or . (Avoid for 2 weeks before or after immunizations). . Barium: Avoid having a procedure within 7-10 days after having had a radiological study involving the use of radiological contrast. (Myelograms, Barium swallow or enema study). . Heart attacks: Avoid any elective procedures or surgeries for the initial 6 months after a "Myocardial Infarction" (Heart Attack). . Blood thinners: It is imperative that you stop these medications before procedures. Let us know if you if you take any blood thinner.  . Infection: Avoid procedures during or within two weeks of an infection (including chest colds or gastrointestinal problems). Symptoms associated with infections include: Localized redness, fever, chills, night sweats or profuse sweating, burning sensation when voiding, cough, congestion, stuffiness, runny nose, sore throat, diarrhea, nausea, vomiting, cold or Flu symptoms, recent or current infections. It is specially important if the infection is over the area that we intend to treat. [place] Heart and lung problems: Symptoms that may suggest an active cardiopulmonary problem include: cough, chest pain, breathing difficulties or shortness of breath, dizziness, ankle swelling, uncontrolled high or unusually low blood pressure, and/or palpitations. If you are experiencing any of these symptoms, cancel your procedure and contact your primary care physician for an evaluation.  Remember:  Regular Business hours are:  Monday to Thursday 8:00 AM to 4:00 PM  Provider's  Schedule: Milinda Pointer, MD:  Procedure days: Tuesday and Thursday 7:30 AM to 4:00 PM  Gillis Santa, MD:  Procedure days: Monday and Wednesday 7:30 AM to 4:00 PM ____________________________________________________________________________________________   ____________________________________________________________________________________________  General Risks and Possible Complications  Patient Responsibilities: It is important that you read this as it is part of your informed consent. It is our duty to inform you of the risks and possible complications associated with treatments offered to you. It is your responsibility as a patient to read this and to ask questions about anything that is not clear or that you believe was not covered in this document.  Patient's Rights: You have the right to refuse treatment. You also have the right to change your mind, even after initially having agreed to have the treatment done. However, under this last option, if you wait until the last second to change your mind, you may be charged for the materials used up to that point.  Introduction: Medicine is not an Chief Strategy Officer. Everything in Medicine, including the lack of treatment(s), carries the potential for danger, harm, or loss (which is by definition: Risk). In Medicine, a complication is a secondary problem, condition, or disease that can aggravate an already existing one. All treatments carry the risk of possible complications. The fact that a side effects or complications occurs, does not imply that the treatment was conducted incorrectly. It must be clearly understood that these can happen even when everything is done following the highest safety standards.  No treatment: You can choose not to proceed with the proposed treatment alternative. The "PRO(s)" would include: avoiding the risk of complications associated with the therapy. The "CON(s)" would include: not getting any of the treatment  benefits. These benefits fall under one of three categories: diagnostic; therapeutic; and/or palliative. Diagnostic benefits include: getting information which can ultimately lead to improvement of the disease or symptom(s). Therapeutic benefits are those associated with the successful treatment of the disease. Finally, palliative benefits are those related to the decrease of the primary symptoms, without necessarily curing the condition (example: decreasing the pain from a flare-up of a chronic condition, such as incurable terminal cancer).  General Risks and Complications: These are associated to most interventional treatments. They can occur alone, or in combination. They fall under one of the following six (6) categories: no benefit or worsening of symptoms; bleeding; infection; nerve damage; allergic reactions; and/or death. 1. No benefits or worsening of symptoms: In Medicine there are no guarantees, only probabilities. No healthcare provider can ever guarantee that a medical treatment will work, they can only state the probability that it may. Furthermore, there is always the possibility that the condition may worsen, either directly, or indirectly, as a consequence of the treatment. 2. Bleeding: This is more common if the patient is taking a blood thinner, either prescription or over the counter (example: Goody Powders, Fish oil, Aspirin, Garlic, etc.), or if suffering a condition associated with impaired coagulation (example: Hemophilia, cirrhosis of the liver, low platelet counts, etc.). However, even if you do not have one on these, it can still happen. If you have any of these conditions, or take one of these drugs, make sure to notify your treating physician. 3. Infection: This is more common in patients with a compromised immune system, either due to disease (example: diabetes, cancer, human immunodeficiency virus [HIV], etc.), or due to medications or treatments (example: therapies used to treat  cancer and  rheumatological diseases). However, even if you do not have one on these, it can still happen. If you have any of these conditions, or take one of these drugs, make sure to notify your treating physician. 4. Nerve Damage: This is more common when the treatment is an invasive one, but it can also happen with the use of medications, such as those used in the treatment of cancer. The damage can occur to small secondary nerves, or to large primary ones, such as those in the spinal cord and brain. This damage may be temporary or permanent and it may lead to impairments that can range from temporary numbness to permanent paralysis and/or brain death. 5. Allergic Reactions: Any time a substance or material comes in contact with our body, there is the possibility of an allergic reaction. These can range from a mild skin rash (contact dermatitis) to a severe systemic reaction (anaphylactic reaction), which can result in death. 6. Death: In general, any medical intervention can result in death, most of the time due to an unforeseen complication. ____________________________________________________________________________________________

## 2021-01-16 NOTE — Progress Notes (Signed)
Safety precautions to be maintained throughout the outpatient stay will include: orient to surroundings, keep bed in low position, maintain call bell within reach at all times, provide assistance with transfer out of bed and ambulation.  

## 2021-01-17 NOTE — Progress Notes (Signed)
PROVIDER NOTE: Information contained herein reflects review and annotations entered in association with encounter. Interpretation of such information and data should be left to medically-trained personnel. Information provided to patient can be located elsewhere in the medical record under "Patient Instructions". Document created using STT-dictation technology, any transcriptional errors that may result from process are unintentional.    Patient: [name]  Service Category: Procedure  Provider: Oswaldo Done, MD  DOB: 1936-07-25  DOS: 01/18/2021  Location: [facility]  MRN: [id number]  Setting: Ambulatory - outpatient  Referring Provider: Doreene Nest, NP  Type: Established Patient  Specialty: Interventional Pain Management  PCP: Doreene Nest, NP   Primary Reason for Visit: Interventional Pain Management Treatment. CC: Back Pain (low)  Procedure:          Anesthesia, Analgesia, Anxiolysis:  Type: Lumbar Facet, Medial Branch Block(s) #1  Primary Purpose: Diagnostic Region: Posterolateral Lumbosacral Spine Level: L2, L3, L4, L5, & S1 Medial Branch Level(s). Injecting these levels blocks the L3-4, L4-5, and L5-S1 lumbar facet joints. Laterality: Bilateral  Type: Moderate (Conscious) Sedation combined with Local Anesthesia Indication(s): Analgesia and Anxiety Route: Intravenous (IV) IV Access: Secured Sedation: Meaningful verbal contact was maintained at all times during the procedure  Local Anesthetic: Lidocaine 1-2%  Position: Prone   Indications: 1. Lumbar facet syndrome   2. Chronic low back pain (1ry area of Pain) (Bilateral) (L>R) w/o sciatica   3. Lumbosacral facet arthropathy (Multilevel) (Bilateral)   4. Spondylosis without myelopathy or radiculopathy, lumbosacral region   5. T12 compression fracture, sequela   6. Compression fracture of L1 lumbar vertebra, sequela   7. Compression fracture of L3 lumbar vertebra, sequela   8. Compression  fracture of L4 lumbar vertebra, sequela   9. DDD (degenerative disc disease), thoracolumbar   10. DDD (degenerative disc disease), lumbosacral    Pain Score: Pre-procedure: 5 /10 Post-procedure: 0-No pain/10   Pre-op H&P Assessment:  [name] is a 85 y.o. (year old), female patient, seen today for interventional treatment. She  has a past surgical history that includes Rotator cuff repair (Bilateral); Total shoulder replacement (Left); Cataract extraction w/PHACO (Left, 04/24/2016); Cataract extraction w/PHACO (Right, 07/14/2017); Esophagogastroduodenoscopy (egd) with propofol (N/A, 10/13/2018); Flexible sigmoidoscopy (N/A, 10/13/2018); Cholecystectomy; Tonsillectomy; Back surgery; Flexible sigmoidoscopy (N/A, 09/09/2019); Flexible sigmoidoscopy (N/A, 08/04/2020); and Hip Arthroplasty (Right, 09/27/2020). [name] has a current medication list which includes the following prescription(s): vitamin d3, citalopram, denosumab, esbriet, hydrocodone-acetaminophen, oxygen-helium, prednisone, sulfasalazine, vitamin b-12, and voltaren, and the following Facility-Administered Medications: fentanyl and midazolam. Her primarily concern today is the Back Pain (low)  Initial Vital Signs:  Pulse/HCG Rate: 68ECG Heart Rate: 66 Temp: (!) 97 F (36.1 C) Resp: 16 BP: (!) 160/80 SpO2: 96 %  BMI: Estimated body mass index is 24.8 kg/m as calculated from the following:   Height as of this encounter: 5' (1.524 m).   Weight as of this encounter: 127 lb (57.6 kg).  Risk Assessment: Allergies: Reviewed. She has No Known Allergies.  Allergy Precautions: None required Coagulopathies: Reviewed. None identified.  Blood-thinner therapy: None at this time Active Infection(s): Reviewed. None identified. [name] is afebrile  Site Confirmation: [name] was asked to confirm the procedure and laterality before marking the site Procedure checklist: Completed Consent: Before the procedure and under the  influence of no sedative(s), amnesic(s), or anxiolytics, the patient was informed of the treatment options, risks and possible complications. To fulfill our ethical and legal obligations, as recommended by the American Medical Association's  Code of Ethics, I have informed the patient of my clinical impression; the nature and purpose of the treatment or procedure; the risks, benefits, and possible complications of the intervention; the alternatives, including doing nothing; the risk(s) and benefit(s) of the alternative treatment(s) or procedure(s); and the risk(s) and benefit(s) of doing nothing. The patient was provided information about the general risks and possible complications associated with the procedure. These may include, but are not limited to: failure to achieve desired goals, infection, bleeding, organ or nerve damage, allergic reactions, paralysis, and death. In addition, the patient was informed of those risks and complications associated to Spine-related procedures, such as failure to decrease pain; infection (i.e.: Meningitis, epidural or intraspinal abscess); bleeding (i.e.: epidural hematoma, subarachnoid hemorrhage, or any other type of intraspinal or peri-dural bleeding); organ or nerve damage (i.e.: Any type of peripheral nerve, nerve root, or spinal cord injury) with subsequent damage to sensory, motor, and/or autonomic systems, resulting in permanent pain, numbness, and/or weakness of one or several areas of the body; allergic reactions; (i.e.: anaphylactic reaction); and/or death. Furthermore, the patient was informed of those risks and complications associated with the medications. These include, but are not limited to: allergic reactions (i.e.: anaphylactic or anaphylactoid reaction(s)); adrenal axis suppression; blood sugar elevation that in diabetics may result in ketoacidosis or comma; water retention that in patients with history of congestive heart failure may result in shortness of  breath, pulmonary edema, and decompensation with resultant heart failure; weight gain; swelling or edema; medication-induced neural toxicity; particulate matter embolism and blood vessel occlusion with resultant organ, and/or nervous system infarction; and/or aseptic necrosis of one or more joints. Finally, the patient was informed that Medicine is not an exact science; therefore, there is also the possibility of unforeseen or unpredictable risks and/or possible complications that may result in a catastrophic outcome. The patient indicated having understood very clearly. We have given the patient no guarantees and we have made no promises. Enough time was given to the patient to ask questions, all of which were answered to the patient's satisfaction. Ms. Marcucci has indicated that she wanted to continue with the procedure. Attestation: I, the ordering provider, attest that I have discussed with the patient the benefits, risks, side-effects, alternatives, likelihood of achieving goals, and potential problems during recovery for the procedure that I have provided informed consent. Date  Time: 01/18/2021  8:24 AM  Pre-Procedure Preparation:  Monitoring: As per clinic protocol. Respiration, ETCO2, SpO2, BP, heart rate and rhythm monitor placed and checked for adequate function Safety Precautions: Patient was assessed for positional comfort and pressure points before starting the procedure. Time-out: I initiated and conducted the "Time-out" before starting the procedure, as per protocol. The patient was asked to participate by confirming the accuracy of the "Time Out" information. Verification of the correct person, site, and procedure were performed and confirmed by me, the nursing staff, and the patient. "Time-out" conducted as per Joint Commission's Universal Protocol (UP.01.01.01). Time: 0926  Description of Procedure:          Laterality: Bilateral. The procedure was performed in identical fashion on  both sides. Levels:  L2, L3, L4, L5, & S1 Medial Branch Level(s) Area Prepped: Posterior Lumbosacral Region DuraPrep (Iodine Povacrylex [0.7% available iodine] and Isopropyl Alcohol, 74% w/w) Safety Precautions: Aspiration looking for blood return was conducted prior to all injections. At no point did we inject any substances, as a needle was being advanced. Before injecting, the patient was told to immediately notify me if she  was experiencing any new onset of "ringing in the ears, or metallic taste in the mouth". No attempts were made at seeking any paresthesias. Safe injection practices and needle disposal techniques used. Medications properly checked for expiration dates. SDV (single dose vial) medications used. After the completion of the procedure, all disposable equipment used was discarded in the proper designated medical waste containers. Local Anesthesia: Protocol guidelines were followed. The patient was positioned over the fluoroscopy table. The area was prepped in the usual manner. The time-out was completed. The target area was identified using fluoroscopy. A 12-in long, straight, sterile hemostat was used with fluoroscopic guidance to locate the targets for each level blocked. Once located, the skin was marked with an approved surgical skin marker. Once all sites were marked, the skin (epidermis, dermis, and hypodermis), as well as deeper tissues (fat, connective tissue and muscle) were infiltrated with a small amount of a short-acting local anesthetic, loaded on a 10cc syringe with a 25G, 1.5-in  Needle. An appropriate amount of time was allowed for local anesthetics to take effect before proceeding to the next step. Local Anesthetic: Lidocaine 2.0% The unused portion of the local anesthetic was discarded in the proper designated containers. Technical explanation of process:  L2 Medial Branch Nerve Block (MBB): The target area for the L2 medial branch is at the junction of the postero-lateral  aspect of the superior articular process and the superior, posterior, and medial edge of the transverse process of L3. Under fluoroscopic guidance, a Quincke needle was inserted until contact was made with os over the superior postero-lateral aspect of the pedicular shadow (target area). After negative aspiration for blood, 0.5 mL of the nerve block solution was injected without difficulty or complication. The needle was removed intact. L3 Medial Branch Nerve Block (MBB): The target area for the L3 medial branch is at the junction of the postero-lateral aspect of the superior articular process and the superior, posterior, and medial edge of the transverse process of L4. Under fluoroscopic guidance, a Quincke needle was inserted until contact was made with os over the superior postero-lateral aspect of the pedicular shadow (target area). After negative aspiration for blood, 0.5 mL of the nerve block solution was injected without difficulty or complication. The needle was removed intact. L4 Medial Branch Nerve Block (MBB): The target area for the L4 medial branch is at the junction of the postero-lateral aspect of the superior articular process and the superior, posterior, and medial edge of the transverse process of L5. Under fluoroscopic guidance, a Quincke needle was inserted until contact was made with os over the superior postero-lateral aspect of the pedicular shadow (target area). After negative aspiration for blood, 0.5 mL of the nerve block solution was injected without difficulty or complication. The needle was removed intact. L5 Medial Branch Nerve Block (MBB): The target area for the L5 medial branch is at the junction of the postero-lateral aspect of the superior articular process and the superior, posterior, and medial edge of the sacral ala. Under fluoroscopic guidance, a Quincke needle was inserted until contact was made with os over the superior postero-lateral aspect of the pedicular shadow  (target area). After negative aspiration for blood, 0.5 mL of the nerve block solution was injected without difficulty or complication. The needle was removed intact. S1 Medial Branch Nerve Block (MBB): The target area for the S1 medial branch is at the posterior and inferior 6 o'clock position of the L5-S1 facet joint. Under fluoroscopic guidance, the Quincke needle inserted for  the L5 MBB was redirected until contact was made with os over the inferior and postero aspect of the sacrum, at the 6 o' clock position under the L5-S1 facet joint (Target area). After negative aspiration for blood, 0.5 mL of the nerve block solution was injected without difficulty or complication. The needle was removed intact.  Nerve block solution: 0.2% PF-Ropivacaine + Triamcinolone (40 mg/mL) diluted to a final concentration of 4 mg of Triamcinolone/mL of Ropivacaine The unused portion of the solution was discarded in the proper designated containers. Procedural Needles: 22-gauge, 3.5-inch, Quincke needles used for all levels.  Once the entire procedure was completed, the treated area was cleaned, making sure to leave some of the prepping solution back to take advantage of its long term bactericidal properties.      Illustration of the posterior view of the lumbar spine and the posterior neural structures. Laminae of L2 through S1 are labeled. DPRL5, dorsal primary ramus of L5; DPRS1, dorsal primary ramus of S1; DPR3, dorsal primary ramus of L3; FJ, facet (zygapophyseal) joint L3-L4; I, inferior articular process of L4; LB1, lateral branch of dorsal primary ramus of L1; IAB, inferior articular branches from L3 medial branch (supplies L4-L5 facet joint); IBP, intermediate branch plexus; MB3, medial branch of dorsal primary ramus of L3; NR3, third lumbar nerve root; S, superior articular process of L5; SAB, superior articular branches from L4 (supplies L4-5 facet joint also); TP3, transverse process of L3.  Vitals:    01/18/21 0940 01/18/21 0946 01/18/21 0956 01/18/21 1005  BP: (!) 141/66 135/80 140/69 (!) 151/69  Pulse: 70     Resp: 12 (!) 23 (!) 22 (!) 21  Temp:  (!) 97 F (36.1 C)  (!) 97.5 F (36.4 C)  TempSrc:  Temporal  Temporal  SpO2: 98% 90% 93% 93%  Weight:      Height:         Start Time: 0926 hrs. End Time: 0938 hrs.  Imaging Guidance (Spinal):          Type of Imaging Technique: Fluoroscopy Guidance (Spinal) Indication(s): Assistance in needle guidance and placement for procedures requiring needle placement in or near specific anatomical locations not easily accessible without such assistance. Exposure Time: Please see nurses notes. Contrast: None used. Fluoroscopic Guidance: I was personally present during the use of fluoroscopy. "Tunnel Vision Technique" used to obtain the best possible view of the target area. Parallax error corrected before commencing the procedure. "Direction-depth-direction" technique used to introduce the needle under continuous pulsed fluoroscopy. Once target was reached, antero-posterior, oblique, and lateral fluoroscopic projection used confirm needle placement in all planes. Images permanently stored in EMR. Interpretation: No contrast injected. I personally interpreted the imaging intraoperatively. Adequate needle placement confirmed in multiple planes. Permanent images saved into the patient's record.  Antibiotic Prophylaxis:   Anti-infectives (From admission, onward)   None     Indication(s): None identified  Post-operative Assessment:  Post-procedure Vital Signs:  Pulse/HCG Rate: 70 (nsr)(!) 58 Temp: (!) 97.5 F (36.4 C) Resp: (!) 21 BP: (!) 151/69 SpO2: 93 %  EBL: None  Complications: No immediate post-treatment complications observed by team, or reported by patient.  Note: The patient tolerated the entire procedure well. A repeat set of vitals were taken after the procedure and the patient was kept under observation following institutional  policy, for this type of procedure. Post-procedural neurological assessment was performed, showing return to baseline, prior to discharge. The patient was provided with post-procedure discharge instructions, including a section on how to  identify potential problems. Should any problems arise concerning this procedure, the patient was given instructions to immediately contact us, at any time, without hesitation. In any case, we plan to contact the patient by telephone for a follow-up status report regarding this interventional procedure.  Comments:  No additional relevant information.  Plan of Care  Orders:  Orders Placed This Encounter  Procedures  . LUMBAR FACET(MEDIAL BRANCH NERVE BLOCK) MBNB    Scheduling Instructions:     Procedure: Lumbar facet block (AKA.: Lumbosacral medial branch nerve block)     Side: Bilateral     Level: L3-4, L4-5, & L5-S1 Facets (L2, L3, L4, L5, & S1 Medial Branch Nerves)     Sedation: Patient's choice.     Timeframe: Today    Order Specific Question:   Where will this procedure be performed?    Answer:   [facility]  . DG PA[facility] C-ARM 1-60 MIN NO REPORT    Intraoperative interpretation by procedural physician at [facility].    Standing Status:   Standing    Number of Occurrences:   1    Order Specific Question:   Reason for exam:    Answer:   Assistance in needle guidance and placement for procedures requiring needle placement in or near specific anatomical locations not easily accessible without such assistance.  . Informed Consent Details: Physician/Practitioner Attestation; Transcribe to consent form and obtain patient signature    Nursing Order: Transcribe to consent form and obtain patient signature. Note: Always confirm laterality of pain with Ms. Janee Morn, before procedure.    Order Specific Question:   Physician/Practitioner attestation of informed consent for procedure/surgical case    Answer:   I, the physician/practitioner,  attest that I have discussed with the patient the benefits, risks, side effects, alternatives, likelihood of achieving goals and potential problems during recovery for the procedure that I have provided informed consent.    Order Specific Question:   Procedure    Answer:   Lumbar Facet Block  under fluoroscopic guidance    Order Specific Question:   Physician/Practitioner performing the procedure    Answer:   Asaf Elmquist A. Laban Emperor MD    Order Specific Question:   Indication/Reason    Answer:   Low Back Pain, with our without leg pain, due to Facet Joint Arthralgia (Joint Pain) Spondylosis (Arthritis of the Spine), without myelopathy or radiculopathy (Nerve Damage).  . Provide equipment / supplies at bedside    "Block Tray" (Disposable  single use) Needle type: SpinalSpinal Amount/quantity: 4 Size: Regular (3.5-inch) Gauge: 22G    Standing Status:   Standing    Number of Occurrences:   1    Order Specific Question:   Specify    Answer:   Block Tray   Chronic Opioid Analgesic:  Hydrocodone/APAP 7.5/325, 1 tab p.o. 3 times daily (22.5 mg/day of hydrocodone) (filled on 12/16/2020) (22.5 MME) MME/day: 22.5 mg/day   Medications ordered for procedure: Meds ordered this encounter  Medications  . lidocaine (XYLOCAINE) 2 % (with pres) injection 400 mg  . lactated ringers infusion 1,000 mL  . midazolam (VERSED) 5 MG/5ML injection 1-2 mg    Make sure Flumazenil is available in the pyxis when using this medication. If oversedation occurs, administer 0.2 mg IV over 15 sec. If after 45 sec no response, administer 0.2 mg again over 1 min; may repeat at 1 min intervals; not to exceed 4 doses (1 mg)  . fentaNYL (SUBLIMAZE) injection 25-50 mcg  Make sure Narcan is available in the pyxis when using this medication. In the event of respiratory depression (RR< 8/min): Titrate NARCAN (naloxone) in increments of 0.1 to 0.2 mg IV at 2-3 minute intervals, until desired degree of reversal.  . ropivacaine (PF) 2  mg/mL (0.2%) (NAROPIN) injection 18 mL  . triamcinolone acetonide (KENALOG-40) injection 80 mg   Medications administered: We administered lidocaine, lactated ringers, midazolam, fentaNYL, ropivacaine (PF) 2 mg/mL (0.2%), and triamcinolone acetonide.  See the medical record for exact dosing, route, and time of administration.  Follow-up plan:   Return in about 2 weeks (around 02/01/2021) for procedure day (afternoon F2F) (PPE).       Interventional Therapies  Risk  Complexity Considerations:   Estimated body mass index is 24.8 kg/m as calculated from the following:   Height as of this encounter: 5' (1.524 m).   Weight as of this encounter: 127 lb (57.6 kg). WNL   Planned  Pending:   Pending further evaluation   Under consideration:   Diagnostic right L2 and L3 transforaminal ESI #1  Diagnostic left lumbar facet block #1  Diagnostic left L4-5 LESI #1  Diagnostic left suprascapular NB #1  Possible left suprascapular nerve RFA #1  Diagnostic right IA glenohumeral and AC joint injection #1    Completed:   None at this time   Therapeutic  Palliative (PRN) options:   None established     Recent Visits Date Type Provider Dept  01/16/21 Office Visit Delano Metz, MD [facility]  01/03/21 Office Visit Delano Metz, MD [facility]  Showing recent visits within past 90 days and meeting all other requirements Today's Visits Date Type Provider Dept  01/18/21 Procedure visit Delano Metz, MD [facility]  Showing today's visits and meeting all other requirements Future Appointments Date Type Provider Dept  03/07/21 Appointment Delano Metz, MD [facility]  Showing future appointments within next 90 days and meeting all other requirements  Disposition: Discharge home  Discharge (Date  Time): 01/18/2021; 1017 hrs.   Primary Care Physician: Doreene Nest, NP Location: [facility]  Facility Note by: Oswaldo Done, MD Date: 01/18/2021; Time: 10:26 AM  Disclaimer:  Medicine is not an Visual merchandiser. The only guarantee in medicine is that nothing is guaranteed. It is important to note that the decision to proceed with this intervention was based on the information collected from the patient. The Data and conclusions were drawn from the patient's questionnaire, the interview, and the physical examination. Because the information was provided in large part by the patient, it cannot be guaranteed that it has not been purposely or unconsciously manipulated. Every effort has been made to obtain as much relevant data as possible for this evaluation. It is important to note that the conclusions that lead to this procedure are derived in large part from the available data. Always take into account that the treatment will also be dependent on availability of resources and existing treatment guidelines, considered by other Pain Management Practitioners as being common knowledge and practice, at the time of the intervention. For Medico-Legal purposes, it is also important to point out that variation in procedural techniques and pharmacological choices are the acceptable norm. The indications, contraindications, technique, and results of the above procedure should only be interpreted and judged by a Board-Certified Interventional Pain Specialist with extensive familiarity and expertise in the same exact procedure and technique.

## 2021-01-18 ENCOUNTER — Ambulatory Visit (HOSPITAL_BASED_OUTPATIENT_CLINIC_OR_DEPARTMENT_OTHER): Payer: Medicare Other | Admitting: Pain Medicine

## 2021-01-18 ENCOUNTER — Ambulatory Visit
Admission: RE | Admit: 2021-01-18 | Discharge: 2021-01-18 | Disposition: A | Discharge status: Home / Self Care | Payer: Medicare Other | Source: Ambulatory Visit | Attending: Pain Medicine | Admitting: Pain Medicine

## 2021-01-18 ENCOUNTER — Encounter: Payer: Self-pay | Admitting: Pain Medicine

## 2021-01-18 ENCOUNTER — Other Ambulatory Visit: Payer: Self-pay

## 2021-01-18 ENCOUNTER — Ambulatory Visit: Payer: Medicare Other | Admitting: Pain Medicine

## 2021-01-18 VITALS — BP 151/69 | HR 70 | Temp 97.5°F | Resp 21 | Ht 60.0 in | Wt 127.0 lb

## 2021-01-18 DIAGNOSIS — G8929 Other chronic pain: Secondary | ICD-10-CM | POA: Insufficient documentation

## 2021-01-18 DIAGNOSIS — S32040S Wedge compression fracture of fourth lumbar vertebra, sequela: Secondary | ICD-10-CM

## 2021-01-18 DIAGNOSIS — M545 Low back pain, unspecified: Secondary | ICD-10-CM | POA: Diagnosis not present

## 2021-01-18 DIAGNOSIS — S32010S Wedge compression fracture of first lumbar vertebra, sequela: Secondary | ICD-10-CM | POA: Insufficient documentation

## 2021-01-18 DIAGNOSIS — S32030S Wedge compression fracture of third lumbar vertebra, sequela: Secondary | ICD-10-CM | POA: Diagnosis not present

## 2021-01-18 DIAGNOSIS — M47817 Spondylosis without myelopathy or radiculopathy, lumbosacral region: Secondary | ICD-10-CM | POA: Diagnosis not present

## 2021-01-18 DIAGNOSIS — M5137 Other intervertebral disc degeneration, lumbosacral region: Secondary | ICD-10-CM | POA: Insufficient documentation

## 2021-01-18 DIAGNOSIS — S22080S Wedge compression fracture of T11-T12 vertebra, sequela: Secondary | ICD-10-CM | POA: Insufficient documentation

## 2021-01-18 DIAGNOSIS — X58XXXS Exposure to other specified factors, sequela: Secondary | ICD-10-CM | POA: Diagnosis not present

## 2021-01-18 DIAGNOSIS — M47816 Spondylosis without myelopathy or radiculopathy, lumbar region: Secondary | ICD-10-CM

## 2021-01-18 DIAGNOSIS — M5135 Other intervertebral disc degeneration, thoracolumbar region: Secondary | ICD-10-CM | POA: Diagnosis not present

## 2021-01-18 MED ORDER — LIDOCAINE HCL 2 % IJ SOLN
20.0000 mL | Freq: Once | INTRAMUSCULAR | Status: AC
  Administered 2021-01-18: 400 mg
  Filled 2021-01-18: qty 20

## 2021-01-18 MED ORDER — MIDAZOLAM HCL 5 MG/5ML IJ SOLN
1.0000 mg | INTRAMUSCULAR | Status: DC | PRN
  Administered 2021-01-18: 1 mg via INTRAVENOUS
  Filled 2021-01-18: qty 5

## 2021-01-18 MED ORDER — ROPIVACAINE HCL 2 MG/ML IJ SOLN
18.0000 mL | Freq: Once | INTRAMUSCULAR | Status: AC
  Administered 2021-01-18: 18 mL via PERINEURAL
  Filled 2021-01-18: qty 20

## 2021-01-18 MED ORDER — TRIAMCINOLONE ACETONIDE 40 MG/ML IJ SUSP
80.0000 mg | Freq: Once | INTRAMUSCULAR | Status: AC
  Administered 2021-01-18: 80 mg
  Filled 2021-01-18: qty 2

## 2021-01-18 MED ORDER — LACTATED RINGERS IV SOLN
1000.0000 mL | Freq: Once | INTRAVENOUS | Status: AC
  Administered 2021-01-18: 1000 mL via INTRAVENOUS

## 2021-01-18 MED ORDER — FENTANYL CITRATE (PF) 100 MCG/2ML IJ SOLN
25.0000 ug | INTRAMUSCULAR | Status: DC | PRN
Start: 2021-01-18 — End: 2021-01-18
  Administered 2021-01-18: 50 ug via INTRAVENOUS
  Filled 2021-01-18: qty 2

## 2021-01-18 NOTE — Progress Notes (Signed)
Safety precautions to be maintained throughout the outpatient stay will include: orient to surroundings, keep bed in low position, maintain call bell within reach at all times, provide assistance with transfer out of bed and ambulation.  

## 2021-01-18 NOTE — Patient Instructions (Signed)

## 2021-01-23 LAB — COMPLIANCE DRUG ANALYSIS, UR

## 2021-02-06 NOTE — Progress Notes (Signed)
PROVIDER NOTE: Information contained herein reflects review and annotations entered in association with encounter. Interpretation of such information and data should be left to medically-trained personnel. Information provided to patient can be located elsewhere in the medical record under "Patient Instructions". Document created using STT-dictation technology, any transcriptional errors that may result from process are unintentional.    Patient: [name]  Service Category: E/M  Provider: Gaspar Cola, MD  DOB: [birth year]  DOS: 02/08/2021  Specialty: Interventional Pain Management  MRN: [id number]  Setting: Ambulatory outpatient  PCP: [name], NP  Type: Established Patient    Referring Provider: [name], NP  Location: Office  Delivery: Face-to-face     HPI  [name], a 85 y.o. year old female, is here today because of her Lumbar facet joint syndrome [M47.816]. [name] primary complain today is Back Pain (lower) Last encounter: My last encounter with her was on 01/18/2021. 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 Pain Assessment:  Severity of Chronic pain is reported as a 7 /10. Location: Back Lower/right hip, left lower back. Onset: More than a month ago. Quality: Other (Comment) (pulsating). Timing: Constant. Modifying factor(s): medications. Vitals:  height is 5' (1.524 m) and weight is 127 lb (57.6 kg). Her temporal temperature is 97.2 F (36.2 C) (abnormal). Her blood pressure is 124/87 and her pulse is 87. Her respiration is 16 and oxygen saturation is 97%.   Reason for encounter: post-procedure assessment.  According to patient the bilateral lumbar facet block did not provide her with any long-term benefit.  She also indicates not having had any numbness that lasted for the duration of the local anesthetic.  I confronted the patient about this is upon leaving the clinic we asked her if she indicated having no pain.  She refers that she had numbness and no pain until she got home and at that time everything came back.  She refers no long-term benefit from the procedure.  In view of this, today we have gone back and reviewed all of her imaging, current history of the pain, and we have come to the conclusion that there is the possibility that this may be coming from her lumbar spine.  Filled this, we will be scheduling the patient to return for diagnostic/therapeutic lumbar epidural steroid injection/transforaminal ESI.  Post-Procedure Evaluation  Procedure (01/18/2021): Diagnostic bilateral lumbar facet MBB #1 under fluoroscopic guidance and IV sedation Pre-procedure pain level: 5/10 Post-procedure: 0/10 (100% relief)  Sedation: Sedation provided.  Effectiveness during initial hour after procedure(Ultra-Short Term Relief): 0 %.  Local anesthetic used: Long-acting (4-6 hours) Effectiveness: Defined as any analgesic benefit obtained secondary to the administration of local anesthetics. This carries significant diagnostic value as to the etiological location, or anatomical origin, of the pain. Duration of benefit is expected to  coincide with the duration of the local anesthetic used.  Effectiveness during initial 4-6 hours after procedure(Short-Term Relief): 0 %.  Long-term benefit: Defined as any relief past the pharmacologic duration of the local anesthetics.  Effectiveness past the initial 6 hours after procedure(Long-Term Relief): 0 %.  Current benefits: Defined as benefit that persist at this time.   Analgesia:  No benefit Function: No improvement ROM: No improvement  Pharmacotherapy Assessment   Analgesic: Hydrocodone/APAP 7.5/325, 1 tab p.o. 3 times daily (22.5 mg/day of hydrocodone) (filled on 12/16/2020) (22.5 MME) MME/day: 22.5 mg/day   Monitoring: [place] PMP: PDMP reviewed during this encounter.       Pharmacotherapy: No side-effects or adverse reactions reported. Compliance: No problems identified. Effectiveness: Clinically acceptable.  Landis Martins, RN  02/08/2021  2:05 PM  Sign when Signing Visit Safety precautions to be maintained throughout the outpatient stay will include: orient to surroundings, keep bed in low position, maintain call bell within reach at all times, provide assistance with transfer out of bed and ambulation.     UDS:  Summary  Date Value Ref Range Status  01/16/2021 Note  Final    Comment:    ==================================================================== Compliance Drug Analysis, Ur ==================================================================== Test                             Result       Flag       Units  Drug Present and Declared for Prescription Verification   Hydrocodone                    1275         EXPECTED   ng/mg creat   Dihydrocodeine                 287          EXPECTED   ng/mg creat   Norhydrocodone                 >3448        EXPECTED   ng/mg creat    Sources of hydrocodone include scheduled prescription medications.    Dihydrocodeine and norhydrocodone are expected metabolites of    hydrocodone. Dihydrocodeine is also available as a  scheduled    prescription medication.    Citalopram                     PRESENT      EXPECTED   Desmethylcitalopram            PRESENT      EXPECTED    Desmethylcitalopram is an expected metabolite of citalopram or the    enantiomeric form, escitalopram.    Acetaminophen                  PRESENT      EXPECTED  Drug Present not Declared for Prescription Verification   Salicylate                     PRESENT      UNEXPECTED  Drug Absent but Declared for Prescription Verification   Diclofenac                     Not Detected UNEXPECTED    Diclofenac, as indicated in the declared medication list, is not    always detected even when used as directed.  ==================================================================== Test  Result    Flag   Units      Ref Range   Creatinine              145              mg/dL      >=20 ==================================================================== Declared Medications:  The flagging and interpretation on this report are based on the  following declared medications.  Unexpected results may arise from  inaccuracies in the declared medications.   **Note: The testing scope of this panel includes these medications:   Citalopram (Celexa)  Hydrocodone (Norco)   **Note: The testing scope of this panel does not include small to  moderate amounts of these reported medications:   Acetaminophen (Norco)  Diclofenac (Voltaren)   **Note: The testing scope of this panel does not include the  following reported medications:   Cholecalciferol  Cyanocobalamin  Denosumab (Prolia)  Helium  Oxygen  Pirfenidone (Esbriet)  Sulfasalazine ==================================================================== For clinical consultation, please call [phone]. ====================================================================      ROS  Constitutional: Denies any fever or chills Gastrointestinal: No reported hemesis, hematochezia,  vomiting, or acute GI distress Musculoskeletal: Denies any acute onset joint swelling, redness, loss of ROM, or weakness Neurological: No reported episodes of acute onset apraxia, aphasia, dysarthria, agnosia, amnesia, paralysis, loss of coordination, or loss of consciousness  Medication Review  HYDROcodone-acetaminophen, Oxygen-Helium, Pirfenidone, Vitamin D3, citalopram, denosumab, diclofenac Sodium, predniSONE, sulfaSALAzine, and vitamin B-12  History Review  Allergy: Ms. Otterson has No Known Allergies. Drug: Ms. Zundel  reports no history of drug use. Alcohol:  reports no history of alcohol use. Tobacco:  reports that she has never smoked. She has never used smokeless tobacco. Social: Ms. Burress  reports that she has never smoked. She has never used smokeless tobacco. She reports that she does not drink alcohol and does not use drugs. 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 Surgical: Ms. Mcmanamon  has a past surgical history that includes Rotator cuff repair (Bilateral); Total shoulder replacement (Left); Cataract extraction w/PHACO (Left, 04/24/2016); Cataract extraction w/PHACO (Right, 07/14/2017); Esophagogastroduodenoscopy (egd) with propofol (N/A, 10/13/2018); Flexible sigmoidoscopy (N/A, 10/13/2018); Cholecystectomy; Tonsillectomy; Back surgery; Flexible sigmoidoscopy (N/A, 09/09/2019); Flexible  sigmoidoscopy (N/A, 08/04/2020); and Hip Arthroplasty (Right, 09/27/2020). Family: family history includes COPD in her mother; Cancer in her brother.  Laboratory Chemistry Profile   Renal Lab Results  Component Value Date   BUN 22 10/24/2020   CREATININE 0.74 10/24/2020   BCR 23 09/21/2020   GFR 75.35 10/24/2020   GFRAA 76 09/21/2020   GFRNONAA >60 09/29/2020     Hepatic Lab Results  Component Value Date   AST 31 09/26/2020   ALT 18 09/26/2020   ALBUMIN 3.6 09/26/2020   ALKPHOS 58 09/26/2020     Electrolytes Lab Results  Component Value Date   NA 140 10/24/2020   K 4.6 10/24/2020   CL 102 10/24/2020   CALCIUM 10.0 10/24/2020   MG 2.1 09/29/2020   PHOS 3.3 05/28/2020     Bone Lab Results  Component Value Date   VD25OH 37.79 10/24/2020   25OHVITD1  42 01/03/2021   25OHVITD2 <1.0 01/03/2021   25OHVITD3 42 01/03/2021     Inflammation (CRP: Acute Phase) (ESR: Chronic Phase) Lab Results  Component Value Date   CRP <1 01/03/2021   ESRSEDRATE 13 01/03/2021       Note: Above Lab results reviewed.  Recent Imaging Review  DG PAIN CLINIC C-ARM 1-60 MIN NO REPORT Fluoro was used, but no Radiologist interpretation will be provided.  Please refer to "NOTES" tab for provider progress note. Note: Reviewed        Physical Exam  General appearance: Well nourished, well developed, and well hydrated. In no apparent acute distress Mental status: Alert, oriented x 3 (person, place, & time)       Respiratory: No evidence of acute respiratory distress Eyes: PERLA Vitals: BP 124/87   Pulse 87   Temp (!) 97.2 F (36.2 C) (Temporal)   Resp 16   Ht 5' (1.524 m)   Wt 127 lb (57.6 kg)   SpO2 97%   BMI 24.80 kg/m  BMI: Estimated body mass index is 24.8 kg/m as calculated from the following:   Height as of this encounter: 5' (1.524 m).   Weight as of this encounter: 127 lb (57.6 kg). Ideal: Ideal body weight: 45.5 kg (100 lb 4.9 oz) Adjusted ideal body weight: 50.3 kg (110  lb 15.8 oz)  Assessment   Status Diagnosis  Controlled Controlled Controlled 1. Lumbar facet syndrome   2. Chronic low back pain (1ry area of Pain) (Bilateral) (L>R) w/o sciatica   3. Lumbosacral facet arthropathy (Multilevel) (Bilateral)   4. Spondylosis without myelopathy or radiculopathy, lumbosacral region   5. T12 compression fracture, sequela   6. Compression fracture of L1 lumbar vertebra, sequela   7. Compression fracture of L4 lumbar vertebra, sequela   8. DDD (degenerative disc disease), thoracolumbar   9. Chronic pain syndrome   10. Abnormal MRI, lumbar spine (12/06/2020)   11. Compression fracture of L3 lumbar vertebra, sequela   12. DDD (degenerative disc disease), lumbosacral      Updated Problems: No problems updated.  Plan of Care  Problem-specific:  No problem-specific Assessment & Plan notes found for this encounter.  Ms. CARMELIA TINER has a current medication list which includes the following long-term medication(s): citalopram and sulfasalazine.  Pharmacotherapy (Medications Ordered): No orders of the defined types were placed in this encounter.  Orders:  Orders Placed This Encounter  Procedures   Lumbar Transforaminal Epidural    Standing Status:   Future    Standing Expiration Date:   03/10/2021    Scheduling Instructions:     Side: Bilateral     Level: L3     Sedation: Patient's choice.     Timeframe: ASAP    Order Specific Question:   Where will this procedure be performed?    Answer:   [facility]   Lumbar Epidural Injection    Standing Status:   Future    Standing Expiration Date:   03/10/2021    Scheduling Instructions:     Procedure: Interlaminar Lumbar Epidural Steroid injection (LESI)  L4-5     Laterality: Left-sided     Sedation: Patient's choice.     Timeframe: ASAA    Order Specific Question:   Where will this procedure be performed?    Answer:   [facility]    Follow-up plan:   Return for Procedure (w/  sedation): (B) L3 TFESI + (L) L4-5 LESI.      Interventional Therapies  Risk  Complexity Considerations:   Estimated body mass index is 24.8 kg/m as calculated from the following:   Height as of this encounter: 5' (1.524 m).   Weight as of this encounter: 127 lb (57.6 kg). WNL   Planned  Pending:   Pending further evaluation   Under consideration:   Diagnostic right L2 and L3 transforaminal ESI #1  Diagnostic left lumbar facet block #1  Diagnostic left L4-5 LESI #1  Diagnostic left suprascapular NB #1  Possible left suprascapular nerve RFA #1  Diagnostic right IA glenohumeral and AC joint injection #1    Completed:   None at this time   Therapeutic  Palliative (PRN) options:   None established    Recent Visits Date Type Provider Dept  01/18/21 Procedure visit Milinda Pointer, MD [facility]  01/16/21 Office Visit Milinda Pointer, MD [facility]  01/03/21 Office Visit Milinda Pointer, MD [facility]  Showing recent visits within past 90 days and meeting all other requirements Today's Visits Date Type Provider Dept  02/08/21 Office Visit Milinda Pointer, MD [facility]  Showing today's visits and meeting all other requirements Future Appointments Date Type Provider Dept  02/13/21 Appointment Milinda Pointer, MD [facility]  Showing future appointments within next 90 days and meeting all other requirements I discussed the assessment and treatment plan with the patient. The patient was provided an opportunity to ask questions and all were answered. The patient agreed with the plan and demonstrated an understanding of the instructions.  Patient advised to call back or seek an in-person evaluation if the symptoms or condition worsens.  Duration of encounter: 30 minutes.  Note by: Gaspar Cola, MD Date: 02/08/2021; Time: 4:47 PM

## 2021-02-08 ENCOUNTER — Other Ambulatory Visit: Payer: Self-pay

## 2021-02-08 ENCOUNTER — Encounter: Payer: Self-pay | Admitting: Pain Medicine

## 2021-02-08 ENCOUNTER — Ambulatory Visit: Payer: Medicare Other | Attending: Pain Medicine | Admitting: Pain Medicine

## 2021-02-08 VITALS — BP 124/87 | HR 87 | Temp 97.2°F | Resp 16 | Ht 60.0 in | Wt 127.0 lb

## 2021-02-08 DIAGNOSIS — M47817 Spondylosis without myelopathy or radiculopathy, lumbosacral region: Secondary | ICD-10-CM | POA: Insufficient documentation

## 2021-02-08 DIAGNOSIS — S32010S Wedge compression fracture of first lumbar vertebra, sequela: Secondary | ICD-10-CM | POA: Diagnosis not present

## 2021-02-08 DIAGNOSIS — S32030S Wedge compression fracture of third lumbar vertebra, sequela: Secondary | ICD-10-CM | POA: Diagnosis not present

## 2021-02-08 DIAGNOSIS — G8929 Other chronic pain: Secondary | ICD-10-CM | POA: Diagnosis not present

## 2021-02-08 DIAGNOSIS — R937 Abnormal findings on diagnostic imaging of other parts of musculoskeletal system: Secondary | ICD-10-CM | POA: Diagnosis not present

## 2021-02-08 DIAGNOSIS — M5137 Other intervertebral disc degeneration, lumbosacral region: Secondary | ICD-10-CM | POA: Insufficient documentation

## 2021-02-08 DIAGNOSIS — S32040S Wedge compression fracture of fourth lumbar vertebra, sequela: Secondary | ICD-10-CM

## 2021-02-08 DIAGNOSIS — G894 Chronic pain syndrome: Secondary | ICD-10-CM | POA: Diagnosis not present

## 2021-02-08 DIAGNOSIS — M5135 Other intervertebral disc degeneration, thoracolumbar region: Secondary | ICD-10-CM | POA: Insufficient documentation

## 2021-02-08 DIAGNOSIS — M47816 Spondylosis without myelopathy or radiculopathy, lumbar region: Secondary | ICD-10-CM

## 2021-02-08 DIAGNOSIS — M545 Low back pain, unspecified: Secondary | ICD-10-CM | POA: Diagnosis not present

## 2021-02-08 DIAGNOSIS — S22080S Wedge compression fracture of T11-T12 vertebra, sequela: Secondary | ICD-10-CM | POA: Insufficient documentation

## 2021-02-08 NOTE — Patient Instructions (Signed)
____________________________________________________________________________________________  Preparing for Procedure with Sedation  Procedure appointments are limited to planned procedures: No Prescription Refills. No disability issues will be discussed. No medication changes will be discussed.  Instructions: Oral Intake: Do not eat or drink anything for at least 8 hours prior to your procedure. (Exception: Blood Pressure Medication. See below.) Transportation: Unless otherwise stated by your physician, you may drive yourself after the procedure. Blood Pressure Medicine: Do not forget to take your blood pressure medicine with a sip of water the morning of the procedure. If your Diastolic (lower reading)is above 100 mmHg, elective cases will be cancelled/rescheduled. Blood thinners: These will need to be stopped for procedures. Notify our staff if you are taking any blood thinners. Depending on which one you take, there will be specific instructions on how and when to stop it. Diabetics on insulin: Notify the staff so that you can be scheduled 1st case in the morning. If your diabetes requires high dose insulin, take only  of your normal insulin dose the morning of the procedure and notify the staff that you have done so. Preventing infections: Shower with an antibacterial soap the morning of your procedure. Build-up your immune system: Take 1000 mg of Vitamin C with every meal (3 times a day) the day prior to your procedure. Antibiotics: Inform the staff if you have a condition or reason that requires you to take antibiotics before dental procedures. Pregnancy: If you are pregnant, call and cancel the procedure. Sickness: If you have a cold, fever, or any active infections, call and cancel the procedure. Arrival: You must be in the facility at least 30 minutes prior to your scheduled procedure. Children: Do not bring children with you. Dress appropriately: Bring dark clothing that you would  not mind if they get stained. Valuables: Do not bring any jewelry or valuables.  Reasons to call and reschedule or cancel your procedure: (Following these recommendations will minimize the risk of a serious complication.) Surgeries: Avoid having procedures within 2 weeks of any surgery. (Avoid for 2 weeks before or after any surgery). Flu Shots: Avoid having procedures within 2 weeks of a flu shots or . (Avoid for 2 weeks before or after immunizations). Barium: Avoid having a procedure within 7-10 days after having had a radiological study involving the use of radiological contrast. (Myelograms, Barium swallow or enema study). Heart attacks: Avoid any elective procedures or surgeries for the initial 6 months after a "Myocardial Infarction" (Heart Attack). Blood thinners: It is imperative that you stop these medications before procedures. Let us know if you if you take any blood thinner.  Infection: Avoid procedures during or within two weeks of an infection (including chest colds or gastrointestinal problems). Symptoms associated with infections include: Localized redness, fever, chills, night sweats or profuse sweating, burning sensation when voiding, cough, congestion, stuffiness, runny nose, sore throat, diarrhea, nausea, vomiting, cold or Flu symptoms, recent or current infections. It is specially important if the infection is over the area that we intend to treat. Heart and lung problems: Symptoms that may suggest an active cardiopulmonary problem include: cough, chest pain, breathing difficulties or shortness of breath, dizziness, ankle swelling, uncontrolled high or unusually low blood pressure, and/or palpitations. If you are experiencing any of these symptoms, cancel your procedure and contact your primary care physician for an evaluation.  Remember:  Regular Business hours are:  Monday to Thursday 8:00 AM to 4:00 PM  Provider's Schedule: Milinda Pointer, MD:  Procedure days: Tuesday and  Thursday 7:30 AM  to 4:00 PM  Gillis Santa, MD:  Procedure days: Monday and Wednesday 7:30 AM to 4:00 PM ____________________________________________________________________________________________  ____________________________________________________________________________________________  General Risks and Possible Complications  Patient Responsibilities: It is important that you read this as it is part of your informed consent. It is our duty to inform you of the risks and possible complications associated with treatments offered to you. It is your responsibility as a patient to read this and to ask questions about anything that is not clear or that you believe was not covered in this document.  Patient's Rights: You have the right to refuse treatment. You also have the right to change your mind, even after initially having agreed to have the treatment done. However, under this last option, if you wait until the last second to change your mind, you may be charged for the materials used up to that point.  Introduction: Medicine is not an Chief Strategy Officer. Everything in Medicine, including the lack of treatment(s), carries the potential for danger, harm, or loss (which is by definition: Risk). In Medicine, a complication is a secondary problem, condition, or disease that can aggravate an already existing one. All treatments carry the risk of possible complications. The fact that a side effects or complications occurs, does not imply that the treatment was conducted incorrectly. It must be clearly understood that these can happen even when everything is done following the highest safety standards.  No treatment: You can choose not to proceed with the proposed treatment alternative. The "PRO(s)" would include: avoiding the risk of complications associated with the therapy. The "CON(s)" would include: not getting any of the treatment benefits. These benefits fall under one of three categories: diagnostic;  therapeutic; and/or palliative. Diagnostic benefits include: getting information which can ultimately lead to improvement of the disease or symptom(s). Therapeutic benefits are those associated with the successful treatment of the disease. Finally, palliative benefits are those related to the decrease of the primary symptoms, without necessarily curing the condition (example: decreasing the pain from a flare-up of a chronic condition, such as incurable terminal cancer).  General Risks and Complications: These are associated to most interventional treatments. They can occur alone, or in combination. They fall under one of the following six (6) categories: no benefit or worsening of symptoms; bleeding; infection; nerve damage; allergic reactions; and/or death. No benefits or worsening of symptoms: In Medicine there are no guarantees, only probabilities. No healthcare provider can ever guarantee that a medical treatment will work, they can only state the probability that it may. Furthermore, there is always the possibility that the condition may worsen, either directly, or indirectly, as a consequence of the treatment. Bleeding: This is more common if the patient is taking a blood thinner, either prescription or over the counter (example: Goody Powders, Fish oil, Aspirin, Garlic, etc.), or if suffering a condition associated with impaired coagulation (example: Hemophilia, cirrhosis of the liver, low platelet counts, etc.). However, even if you do not have one on these, it can still happen. If you have any of these conditions, or take one of these drugs, make sure to notify your treating physician. Infection: This is more common in patients with a compromised immune system, either due to disease (example: diabetes, cancer, human immunodeficiency virus [HIV], etc.), or due to medications or treatments (example: therapies used to treat cancer and rheumatological diseases). However, even if you do not have one on  these, it can still happen. If you have any of these conditions, or take one of  these drugs, make sure to notify your treating physician. Nerve Damage: This is more common when the treatment is an invasive one, but it can also happen with the use of medications, such as those used in the treatment of cancer. The damage can occur to small secondary nerves, or to large primary ones, such as those in the spinal cord and brain. This damage may be temporary or permanent and it may lead to impairments that can range from temporary numbness to permanent paralysis and/or brain death. Allergic Reactions: Any time a substance or material comes in contact with our body, there is the possibility of an allergic reaction. These can range from a mild skin rash (contact dermatitis) to a severe systemic reaction (anaphylactic reaction), which can result in death. Death: In general, any medical intervention can result in death, most of the time due to an unforeseen complication. ____________________________________________________________________________________________

## 2021-02-08 NOTE — Progress Notes (Signed)
Safety precautions to be maintained throughout the outpatient stay will include: orient to surroundings, keep bed in low position, maintain call bell within reach at all times, provide assistance with transfer out of bed and ambulation.  

## 2021-02-13 ENCOUNTER — Ambulatory Visit (HOSPITAL_BASED_OUTPATIENT_CLINIC_OR_DEPARTMENT_OTHER): Payer: Medicare Other | Admitting: Pain Medicine

## 2021-02-13 ENCOUNTER — Other Ambulatory Visit: Payer: Self-pay

## 2021-02-13 ENCOUNTER — Ambulatory Visit
Admission: RE | Admit: 2021-02-13 | Discharge: 2021-02-13 | Disposition: A | Discharge status: Home / Self Care | Payer: Medicare Other | Source: Ambulatory Visit | Attending: Pain Medicine | Admitting: Pain Medicine

## 2021-02-13 ENCOUNTER — Encounter: Payer: Self-pay | Admitting: Pain Medicine

## 2021-02-13 VITALS — BP 155/62 | HR 72 | Temp 98.1°F | Resp 15 | Ht 60.0 in | Wt 127.0 lb

## 2021-02-13 DIAGNOSIS — M5137 Other intervertebral disc degeneration, lumbosacral region: Secondary | ICD-10-CM | POA: Diagnosis not present

## 2021-02-13 DIAGNOSIS — S32030S Wedge compression fracture of third lumbar vertebra, sequela: Secondary | ICD-10-CM

## 2021-02-13 DIAGNOSIS — G8929 Other chronic pain: Secondary | ICD-10-CM

## 2021-02-13 DIAGNOSIS — S32010S Wedge compression fracture of first lumbar vertebra, sequela: Secondary | ICD-10-CM

## 2021-02-13 DIAGNOSIS — S22080S Wedge compression fracture of T11-T12 vertebra, sequela: Secondary | ICD-10-CM | POA: Insufficient documentation

## 2021-02-13 DIAGNOSIS — M47816 Spondylosis without myelopathy or radiculopathy, lumbar region: Secondary | ICD-10-CM | POA: Insufficient documentation

## 2021-02-13 DIAGNOSIS — M5416 Radiculopathy, lumbar region: Secondary | ICD-10-CM | POA: Insufficient documentation

## 2021-02-13 DIAGNOSIS — S32040S Wedge compression fracture of fourth lumbar vertebra, sequela: Secondary | ICD-10-CM | POA: Diagnosis not present

## 2021-02-13 DIAGNOSIS — M545 Low back pain, unspecified: Secondary | ICD-10-CM | POA: Insufficient documentation

## 2021-02-13 MED ORDER — ROPIVACAINE HCL 2 MG/ML IJ SOLN
INTRAMUSCULAR | Status: AC
  Filled 2021-02-13: qty 20

## 2021-02-13 MED ORDER — ROPIVACAINE HCL 2 MG/ML IJ SOLN
2.0000 mL | Freq: Once | INTRAMUSCULAR | Status: AC
  Administered 2021-02-13: 2 mL via EPIDURAL

## 2021-02-13 MED ORDER — DEXAMETHASONE SODIUM PHOSPHATE 10 MG/ML IJ SOLN
20.0000 mg | Freq: Once | INTRAMUSCULAR | Status: AC
  Administered 2021-02-13: 20 mg
  Filled 2021-02-13: qty 2

## 2021-02-13 MED ORDER — MIDAZOLAM HCL 5 MG/5ML IJ SOLN
1.0000 mg | INTRAMUSCULAR | Status: DC | PRN
  Administered 2021-02-13 (×2): 0.5 mg via INTRAVENOUS
  Filled 2021-02-13: qty 5

## 2021-02-13 MED ORDER — LIDOCAINE HCL (PF) 2 % IJ SOLN
INTRAMUSCULAR | Status: AC
  Filled 2021-02-13: qty 20

## 2021-02-13 MED ORDER — FENTANYL CITRATE (PF) 100 MCG/2ML IJ SOLN
25.0000 ug | INTRAMUSCULAR | Status: AC | PRN
  Administered 2021-02-13 (×2): 25 ug via INTRAVENOUS
  Filled 2021-02-13: qty 2

## 2021-02-13 MED ORDER — IOHEXOL 180 MG/ML  SOLN
10.0000 mL | Freq: Once | INTRAMUSCULAR | Status: AC
  Administered 2021-02-13: 10 mL via EPIDURAL
  Filled 2021-02-13: qty 20

## 2021-02-13 MED ORDER — SODIUM CHLORIDE 0.9% FLUSH
2.0000 mL | Freq: Once | INTRAVENOUS | Status: AC
  Administered 2021-02-13: 2 mL

## 2021-02-13 MED ORDER — SODIUM CHLORIDE (PF) 0.9 % IJ SOLN
INTRAMUSCULAR | Status: AC
  Filled 2021-02-13: qty 20

## 2021-02-13 MED ORDER — TRIAMCINOLONE ACETONIDE 40 MG/ML IJ SUSP
40.0000 mg | Freq: Once | INTRAMUSCULAR | Status: AC
Start: 2021-02-13 — End: 2021-02-13
  Administered 2021-02-13: 40 mg
  Filled 2021-02-13: qty 1

## 2021-02-13 MED ORDER — SODIUM CHLORIDE 0.9% FLUSH
2.0000 mL | Freq: Once | INTRAVENOUS | Status: AC
Start: 2021-02-13 — End: 2021-02-13
  Administered 2021-02-13: 2 mL

## 2021-02-13 MED ORDER — LIDOCAINE HCL 2 % IJ SOLN
20.0000 mL | Freq: Once | INTRAMUSCULAR | Status: AC
Start: 2021-02-13 — End: 2021-02-13
  Administered 2021-02-13: 100 mg

## 2021-02-13 MED ORDER — LACTATED RINGERS IV SOLN
1000.0000 mL | Freq: Once | INTRAVENOUS | Status: AC
  Administered 2021-02-13: 1000 mL via INTRAVENOUS

## 2021-02-13 NOTE — Patient Instructions (Signed)

## 2021-02-13 NOTE — Progress Notes (Signed)
PROVIDER NOTE: Information contained herein reflects review and annotations entered in association with encounter. Interpretation of such information and data should be left to medically-trained personnel. Information provided to patient can be located elsewhere in the medical record under "Patient Instructions". Document created using STT-dictation technology, any transcriptional errors that may result from process are unintentional.    Patient: [name]  Service Category: Procedure  Provider: Oswaldo Done, MD  DOB: 15-May-1936  DOS: 02/13/2021  Location: [facility]  MRN: [id number]  Setting: Ambulatory - outpatient  Referring Provider: Doreene Nest, NP  Type: Established Patient  Specialty: Interventional Pain Management  PCP: Doreene Nest, NP   Primary Reason for Visit: Interventional Pain Management Treatment. CC: Back Pain  Procedure #1:  Anesthesia, Analgesia, Anxiolysis:  Type: Therapeutic Trans-Foraminal Epidural Steroid Injection            Region: Lumbar Level: L3 Paravertebral Laterality: Bilateral Paravertebral   Type: Moderate (Conscious) Sedation combined with Local Anesthesia Indication(s): Analgesia and Anxiety Route: Intravenous (IV) IV Access: Secured Sedation: Meaningful verbal contact was maintained at all times during the procedure  Local Anesthetic: Lidocaine 1-2%  Position: Prone  Procedure #2:    Type: Therapeutic Inter-Laminar Epidural Steroid Injection            Region: Lumbar Level: L4-5 Level. Laterality: Left             Indications: 1. DDD (degenerative disc disease), lumbosacral   2. Chronic low back pain (1ry area of Pain) (Bilateral) (L>R) w/o sciatica   3. T12 compression fracture, sequela   4. Compression fracture of L1 lumbar vertebra, sequela   5. Compression fracture of L3 lumbar vertebra, sequela   6. Compression fracture of L4 lumbar vertebra, sequela   7. Chronic radicular pain of lower back   8.  Lumbar spondylosis    Pain Score: Pre-procedure: 6 /10 Post-procedure: 0-No pain/10   Pre-op H&P Assessment:  [name] is a 85 y.o. (year old), female patient, seen today for interventional treatment. She  has a past surgical history that includes Rotator cuff repair (Bilateral); Total shoulder replacement (Left); Cataract extraction w/PHACO (Left, 04/24/2016); Cataract extraction w/PHACO (Right, 07/14/2017); Esophagogastroduodenoscopy (egd) with propofol (N/A, 10/13/2018); Flexible sigmoidoscopy (N/A, 10/13/2018); Cholecystectomy; Tonsillectomy; Back surgery; Flexible sigmoidoscopy (N/A, 09/09/2019); Flexible sigmoidoscopy (N/A, 08/04/2020); and Hip Arthroplasty (Right, 09/27/2020). [name] has a current medication list which includes the following prescription(s): vitamin d3, citalopram, denosumab, esbriet, hydrocodone-acetaminophen, oxygen-helium, prednisone, sulfasalazine, vitamin b-12, and voltaren, and the following Facility-Administered Medications: midazolam. Her primarily concern today is the Back Pain  Initial Vital Signs:  Pulse/HCG Rate: 80ECG Heart Rate: 70 Temp: (!) 97.2 F (36.2 C) Resp: 16 BP: (!) 153/87 SpO2: 95 %  BMI: Estimated body mass index is 24.8 kg/m as calculated from the following:   Height as of this encounter: 5' (1.524 m).   Weight as of this encounter: 127 lb (57.6 kg).  Risk Assessment: Allergies: Reviewed. She has No Known Allergies.  Allergy Precautions: None required Coagulopathies: Reviewed. None identified.  Blood-thinner therapy: None at this time Active Infection(s): Reviewed. None identified. [name] is afebrile  Site Confirmation: [name] was asked to confirm the procedure and laterality before marking the site Procedure checklist: Completed Consent: Before the procedure and under the influence of no sedative(s), amnesic(s), or anxiolytics, the patient was informed of the treatment options, risks and possible complications. To  fulfill our ethical and legal obligations, as recommended by the American Medical Association's Code of  Ethics, I have informed the patient of my clinical impression; the nature and purpose of the treatment or procedure; the risks, benefits, and possible complications of the intervention; the alternatives, including doing nothing; the risk(s) and benefit(s) of the alternative treatment(s) or procedure(s); and the risk(s) and benefit(s) of doing nothing. The patient was provided information about the general risks and possible complications associated with the procedure. These may include, but are not limited to: failure to achieve desired goals, infection, bleeding, organ or nerve damage, allergic reactions, paralysis, and death. In addition, the patient was informed of those risks and complications associated to Spine-related procedures, such as failure to decrease pain; infection (i.e.: Meningitis, epidural or intraspinal abscess); bleeding (i.e.: epidural hematoma, subarachnoid hemorrhage, or any other type of intraspinal or peri-dural bleeding); organ or nerve damage (i.e.: Any type of peripheral nerve, nerve root, or spinal cord injury) with subsequent damage to sensory, motor, and/or autonomic systems, resulting in permanent pain, numbness, and/or weakness of one or several areas of the body; allergic reactions; (i.e.: anaphylactic reaction); and/or death. Furthermore, the patient was informed of those risks and complications associated with the medications. These include, but are not limited to: allergic reactions (i.e.: anaphylactic or anaphylactoid reaction(s)); adrenal axis suppression; blood sugar elevation that in diabetics may result in ketoacidosis or comma; water retention that in patients with history of congestive heart failure may result in shortness of breath, pulmonary edema, and decompensation with resultant heart failure; weight gain; swelling or edema; medication-induced neural toxicity;  particulate matter embolism and blood vessel occlusion with resultant organ, and/or nervous system infarction; and/or aseptic necrosis of one or more joints. Finally, the patient was informed that Medicine is not an exact science; therefore, there is also the possibility of unforeseen or unpredictable risks and/or possible complications that may result in a catastrophic outcome. The patient indicated having understood very clearly. We have given the patient no guarantees and we have made no promises. Enough time was given to the patient to ask questions, all of which were answered to the patient's satisfaction. Ms. Skubic has indicated that she wanted to continue with the procedure. Attestation: I, the ordering provider, attest that I have discussed with the patient the benefits, risks, side-effects, alternatives, likelihood of achieving goals, and potential problems during recovery for the procedure that I have provided informed consent. Date  Time: 02/13/2021  9:07 AM  Pre-Procedure Preparation:  Monitoring: As per clinic protocol. Respiration, ETCO2, SpO2, BP, heart rate and rhythm monitor placed and checked for adequate function Safety Precautions: Patient was assessed for positional comfort and pressure points before starting the procedure. Time-out: I initiated and conducted the "Time-out" before starting the procedure, as per protocol. The patient was asked to participate by confirming the accuracy of the "Time Out" information. Verification of the correct person, site, and procedure were performed and confirmed by me, the nursing staff, and the patient. "Time-out" conducted as per Joint Commission's Universal Protocol (UP.01.01.01). Time: 1024  Description of Procedure #1:  Target Area: The inferior and lateral portion of the pedicle, just lateral to a line created by the 6:00 position of the pedicle and the superior articular process of the vertebral body below. On the lateral view, this  target lies just posterior to the anterior aspect of the lamina and posterior to the midpoint created between the anterior and the posterior aspect of the neural foramina. Approach: Posterior paravertebral approach. Area Prepped: Entire Posterior Lumbosacral Region DuraPrep (Iodine Povacrylex [0.7% available iodine] and Isopropyl Alcohol, 74% w/w)  Safety Precautions: Aspiration looking for blood return was conducted prior to all injections. At no point did we inject any substances, as a needle was being advanced. No attempts were made at seeking any paresthesias. Safe injection practices and needle disposal techniques used. Medications properly checked for expiration dates. SDV (single dose vial) medications used.  Description of the Procedure: Protocol guidelines were followed. The patient was placed in position over the fluoroscopy table. The target area was identified and the area prepped in the usual manner. Skin & deeper tissues infiltrated with local anesthetic. Appropriate amount of time allowed to pass for local anesthetics to take effect. The procedure needles were then advanced to the target area. Proper needle placement secured. Negative aspiration confirmed. Solution injected in intermittent fashion, asking for systemic symptoms every 0.2cc of injectate. The needles were then removed and the area cleansed, making sure to leave some of the prepping solution back to take advantage of its long term bactericidal properties.  Start Time: 1024 hrs.  Materials:  Needle(s) Type: Spinal Needle Gauge: 22G Length: 3.5-in Medication(s): Please see orders for medications and dosing details.  Description of Procedure #2:  Target Area: The  interlaminar space, initially targeting the lower border of the superior vertebral body lamina. Approach: Posterior paramedial approach. Area Prepped: Same as above Prepping solution: Same as above Safety Precautions: Same as above  Description of the  Procedure: Protocol guidelines were followed. The patient was placed in position over the fluoroscopy table. The target area was identified and the area prepped in the usual manner. Skin & deeper tissues infiltrated with local anesthetic. Appropriate amount of time allowed to pass for local anesthetics to take effect. The procedure needle was introduced through the skin, ipsilateral to the reported pain, and advanced to the target area. Bone was contacted and the needle walked caudad, until the lamina was cleared. The ligamentum flavum was engaged and loss-of-resistance technique used as the epidural needle was advanced. The epidural space was identified using "loss-of-resistance technique" with 2-3 ml of PF-NaCl (0.9% NSS), in a 5cc LOR glass syringe. Proper needle placement secured. Negative aspiration confirmed. Solution injected in intermittent fashion, asking for systemic symptoms every 0.5cc of injectate. The needles were then removed and the area cleansed, making sure to leave some of the prepping solution back to take advantage of its long term bactericidal properties.  Vitals:   02/13/21 1058 02/13/21 1103 02/13/21 1109 02/13/21 1115  BP:  (!) 149/73  (!) 155/62  Pulse:      Resp:  12  15  Temp:    98.1 F (36.7 C)  TempSrc:      SpO2: 96% 100% 98% 98%  Weight:      Height:        End Time: 1047 hrs.  Materials:  Needle(s) Type: Epidural needle Gauge: 17G Length: 3.5-in Medication(s): Please see orders for medications and dosing details.  Imaging Guidance (Spinal):          Type of Imaging Technique: Fluoroscopy Guidance (Spinal) Indication(s): Assistance in needle guidance and placement for procedures requiring needle placement in or near specific anatomical locations not easily accessible without such assistance. Exposure Time: Please see nurses notes. Contrast: Before injecting any contrast, we confirmed that the patient did not have an allergy to iodine, shellfish, or  radiological contrast. Once satisfactory needle placement was completed at the desired level, radiological contrast was injected. Contrast injected under live fluoroscopy. No contrast complications. See chart for type and volume of contrast used. Fluoroscopic Guidance:  I was personally present during the use of fluoroscopy. "Tunnel Vision Technique" used to obtain the best possible view of the target area. Parallax error corrected before commencing the procedure. "Direction-depth-direction" technique used to introduce the needle under continuous pulsed fluoroscopy. Once target was reached, antero-posterior, oblique, and lateral fluoroscopic projection used confirm needle placement in all planes. Images permanently stored in EMR. Interpretation: I personally interpreted the imaging intraoperatively. Adequate needle placement confirmed in multiple planes. Appropriate spread of contrast into desired area was observed. No evidence of afferent or efferent intravascular uptake. No intrathecal or subarachnoid spread observed. Permanent images saved into the patient's record.  Antibiotic Prophylaxis:   Anti-infectives (From admission, onward)    None      Indication(s): None identified  Post-operative Assessment:  Post-procedure Vital Signs:  Pulse/HCG Rate: 72 (nsr)68 Temp: 98.1 F (36.7 C) Resp: 15 BP: (!) 155/62 SpO2: 98 %  EBL: None  Complications: No immediate post-treatment complications observed by team, or reported by patient.  Note: The patient tolerated the entire procedure well. A repeat set of vitals were taken after the procedure and the patient was kept under observation following institutional policy, for this type of procedure. Post-procedural neurological assessment was performed, showing return to baseline, prior to discharge. The patient was provided with post-procedure discharge instructions, including a section on how to identify potential problems. Should any problems arise  concerning this procedure, the patient was given instructions to immediately contact us, at any time, without hesitation. In any case, we plan to contact the patient by telephone for a follow-up status report regarding this interventional procedure.  Comments:  No additional relevant information.  Plan of Care  Orders:  Orders Placed This Encounter  Procedures   Lumbar Epidural Injection    Scheduling Instructions:     Procedure: Interlaminar LESI L4-5     Laterality: Left-sided     Sedation: Patient's choice     Timeframe:  Today    Order Specific Question:   Where will this procedure be performed?    Answer:   [facility]   Lumbar Transforaminal Epidural    Scheduling Instructions:     Side: Bilateral     Level: L3     Sedation: Patient's choice.     Timeframe: Today    Order Specific Question:   Where will this procedure be performed?    Answer:   [facility]   DG PAIN [facility] C-ARM 1-60 MIN NO REPORT    Intraoperative interpretation by procedural physician at [facility].    Standing Status:   Standing    Number of Occurrences:   1    Order Specific Question:   Reason for exam:    Answer:   Assistance in needle guidance and placement for procedures requiring needle placement in or near specific anatomical locations not easily accessible without such assistance.   Informed Consent Details: Physician/Practitioner Attestation; Transcribe to consent form and obtain patient signature    Note: Always confirm laterality of pain with Ms. Janee Morn, before procedure. Transcribe to consent form and obtain patient signature.    Order Specific Question:   Physician/Practitioner attestation of informed consent for procedure/surgical case    Answer:   I, the physician/practitioner, attest that I have discussed with the patient the benefits, risks, side effects, alternatives, likelihood of achieving goals and potential problems during recovery for the procedure that  I have provided informed consent.    Order Specific Question:   Procedure    Answer:  Lumbar epidural steroid injection under fluoroscopic guidance    Order Specific Question:   Physician/Practitioner performing the procedure    Answer:   Gracin Mcpartland A. Laban Emperor, MD    Order Specific Question:   Indication/Reason    Answer:   Low back and/or lower extremity pain secondary to lumbar radiculitis   Informed Consent Details: Physician/Practitioner Attestation; Transcribe to consent form and obtain patient signature    Provider Attestation: I, Gottfried Standish A. Laban Emperor, MD, (Pain Management Specialist), the physician/practitioner, attest that I have discussed with the patient the benefits, risks, side effects, alternatives, likelihood of achieving goals and potential problems during recovery for the procedure that I have provided informed consent.    Scheduling Instructions:     Note: Always confirm laterality of pain with Ms. Janee Morn, before procedure.     Transcribe to consent form and obtain patient signature.    Order Specific Question:   Physician/Practitioner attestation of informed consent for procedure/surgical case    Answer:   I, the physician/practitioner, attest that I have discussed with the patient the benefits, risks, side effects, alternatives, likelihood of achieving goals and potential problems during recovery for the procedure that I have provided informed consent.    Order Specific Question:   Procedure    Answer:   Diagnostic lumbar transforaminal epidural steroid injection under fluoroscopic guidance. (See notes for level and laterality.)    Order Specific Question:   Physician/Practitioner performing the procedure    Answer:   Taquisha Phung A. Laban Emperor, MD    Order Specific Question:   Indication/Reason    Answer:   Lumbar radiculopathy/radiculitis associated with lumbar stenosis   Provide equipment / supplies at bedside    "Epidural Tray" (Disposable  single use) Catheter: NOT required     Standing Status:   Standing    Number of Occurrences:   1    Order Specific Question:   Specify    Answer:   Epidural Tray    Chronic Opioid Analgesic:  Hydrocodone/APAP 7.5/325, 1 tab p.o. 3 times daily (22.5 mg/day of hydrocodone) (filled on 12/16/2020) (22.5 MME) MME/day: 22.5 mg/day   Medications ordered for procedure: Meds ordered this encounter  Medications   iohexol (OMNIPAQUE) 180 MG/ML injection 10 mL    Must be Myelogram-compatible. If not available, you may substitute with a water-soluble, non-ionic, hypoallergenic, myelogram-compatible radiological contrast medium.   lidocaine (XYLOCAINE) 2 % (with pres) injection 400 mg   lactated ringers infusion 1,000 mL   midazolam (VERSED) 5 MG/5ML injection 1-2 mg    Make sure Flumazenil is available in the pyxis when using this medication. If oversedation occurs, administer 0.2 mg IV over 15 sec. If after 45 sec no response, administer 0.2 mg again over 1 min; may repeat at 1 min intervals; not to exceed 4 doses (1 mg)   fentaNYL (SUBLIMAZE) injection 25-50 mcg    Make sure Narcan is available in the pyxis when using this medication. In the event of respiratory depression (RR< 8/min): Titrate NARCAN (naloxone) in increments of 0.1 to 0.2 mg IV at 2-3 minute intervals, until desired degree of reversal.   sodium chloride flush (NS) 0.9 % injection 2 mL   ropivacaine (PF) 2 mg/mL (0.2%) (NAROPIN) injection 2 mL   triamcinolone acetonide (KENALOG-40) injection 40 mg   sodium chloride flush (NS) 0.9 % injection 2 mL   ropivacaine (PF) 2 mg/mL (0.2%) (NAROPIN) injection 2 mL   dexamethasone (DECADRON) injection 20 mg    Medications administered: We administered iohexol, lidocaine, lactated ringers,  midazolam, fentaNYL, sodium chloride flush, ropivacaine (PF) 2 mg/mL (0.2%), triamcinolone acetonide, sodium chloride flush, ropivacaine (PF) 2 mg/mL (0.2%), and dexamethasone.  See the medical record for exact dosing, route, and time of  administration.  Follow-up plan:   Return in about 2 weeks (around 02/27/2021) for procedure day (afternoon F2F) (PPE).      Interventional Therapies  Risk  Complexity Considerations:   Estimated body mass index is 24.8 kg/m as calculated from the following:   Height as of this encounter: 5' (1.524 m).   Weight as of this encounter: 127 lb (57.6 kg). WNL   Planned  Pending:   Pending further evaluation   Under consideration:   Diagnostic right L2 and L3 transforaminal ESI #1  Diagnostic left lumbar facet block #1  Diagnostic left L4-5 LESI #1  Diagnostic left suprascapular NB #1  Possible left suprascapular nerve RFA #1  Diagnostic right IA glenohumeral and AC joint injection #1    Completed:   None at this time   Therapeutic  Palliative (PRN) options:   None established     Recent Visits Date Type Provider Dept  02/08/21 Office Visit Delano Metz, MD [facility]  01/18/21 Procedure visit Delano Metz, MD [facility]  01/16/21 Office Visit Delano Metz, MD [facility]  01/03/21 Office Visit Delano Metz, MD [facility]  Showing recent visits within past 90 days and meeting all other requirements Today's Visits Date Type Provider Dept  02/13/21 Procedure visit Delano Metz, MD [facility]  Showing today's visits and meeting all other requirements Future Appointments Date Type Provider Dept  02/27/21 Appointment Delano Metz, MD [facility]  Showing future appointments within next 90 days and meeting all other requirements Disposition: Discharge home  Discharge (Date  Time): 02/13/2021; 1119 hrs.   Primary Care Physician: Doreene Nest, NP Location: [facility] Note by: Oswaldo Done, MD Date: 02/13/2021; Time: 12:33 PM  Disclaimer:  Medicine is not an Visual merchandiser. The only guarantee in medicine is that nothing is guaranteed. It  is important to note that the decision to proceed with this intervention was based on the information collected from the patient. The Data and conclusions were drawn from the patient's questionnaire, the interview, and the physical examination. Because the information was provided in large part by the patient, it cannot be guaranteed that it has not been purposely or unconsciously manipulated. Every effort has been made to obtain as much relevant data as possible for this evaluation. It is important to note that the conclusions that lead to this procedure are derived in large part from the available data. Always take into account that the treatment will also be dependent on availability of resources and existing treatment guidelines, considered by other Pain Management Practitioners as being common knowledge and practice, at the time of the intervention. For Medico-Legal purposes, it is also important to point out that variation in procedural techniques and pharmacological choices are the acceptable norm. The indications, contraindications, technique, and results of the above procedure should only be interpreted and judged by a Board-Certified Interventional Pain Specialist with extensive familiarity and expertise in the same exact procedure and technique.

## 2021-02-13 NOTE — Progress Notes (Signed)
Safety precautions to be maintained throughout the outpatient stay will include: orient to surroundings, keep bed in low position, maintain call bell within reach at all times, provide assistance with transfer out of bed and ambulation.  

## 2021-02-14 ENCOUNTER — Telehealth: Payer: Self-pay

## 2021-02-14 NOTE — Telephone Encounter (Signed)
Post procedure phone call.  Patient states she is doing well.  

## 2021-02-16 ENCOUNTER — Other Ambulatory Visit: Payer: Self-pay

## 2021-02-16 ENCOUNTER — Ambulatory Visit (INDEPENDENT_AMBULATORY_CARE_PROVIDER_SITE_OTHER): Payer: Medicare Other

## 2021-02-16 DIAGNOSIS — Z0000 Encounter for general adult medical examination without abnormal findings: Secondary | ICD-10-CM

## 2021-02-16 NOTE — Patient Instructions (Signed)
[name] , Thank you for taking time to come for your Medicare Wellness Visit. I appreciate your ongoing commitment to your health goals. Please review the following plan we discussed and let me know if I can assist you in the future.   Screening recommendations/referrals: Colonoscopy: Up to date, completed 08/04/2020, no longer required  Mammogram: no longer required  Bone Density: due, will discuss with provider  Recommended yearly ophthalmology/optometry visit for glaucoma screening and checkup Recommended yearly dental visit for hygiene and checkup  Vaccinations: Influenza vaccine: Up to date, completed 05/11/2020, due 04/2021 Pneumococcal vaccine: Completed series Tdap vaccine: Up to date, completed 05/25/2011, due 05/2021 Shingles vaccine: completed 04/14/2020, will get second dose after completing covid series    Covid-19:Completed 3 Vaccines. Second booster scheduled 02/19/2021 per patient   Advanced directives: Please bring a copy of your POA (Power of [place]) and/or Living Will to your next appointment.  Conditions/risks identified: none  Next appointment: Follow up in one year for your annual wellness visit    Preventive Care 65 Years and Older, Female Preventive care refers to lifestyle choices and visits with your health care provider that can promote health and wellness. What does preventive care include? A yearly physical exam. This is also called an annual well check. Dental exams once or twice a year. Routine eye exams. Ask your health care provider how often you should have your eyes checked. Personal lifestyle choices, including: Daily care of your teeth and gums. Regular physical activity. Eating a healthy diet. Avoiding tobacco and drug use. Limiting alcohol use. Practicing safe sex. Taking low-dose aspirin every day. Taking vitamin and mineral supplements as recommended by your health care provider. What happens during an annual well check? The services and  screenings done by your health care provider during your annual well check will depend on your age, overall health, lifestyle risk factors, and family history of disease. Counseling  Your health care provider may ask you questions about your: Alcohol use. Tobacco use. Drug use. Emotional well-being. Home and relationship well-being. Sexual activity. Eating habits. History of falls. Memory and ability to understand (cognition). Work and work Statistician. Reproductive health. Screening  You may have the following tests or measurements: Height, weight, and BMI. Blood pressure. Lipid and cholesterol levels. These may be checked every 5 years, or more frequently if you are over 15 years old. Skin check. Lung cancer screening. You may have this screening every year starting at age 15 if you have a 30-pack-year history of smoking and currently smoke or have quit within the past 15 years. Fecal occult blood test (FOBT) of the stool. You may have this test every year starting at age 40. Flexible sigmoidoscopy or colonoscopy. You may have a sigmoidoscopy every 5 years or a colonoscopy every 10 years starting at age 93. Hepatitis C blood test. Hepatitis B blood test. Sexually transmitted disease (STD) testing. Diabetes screening. This is done by checking your blood sugar (glucose) after you have not eaten for a while (fasting). You may have this done every 1-3 years. Bone density scan. This is done to screen for osteoporosis. You may have this done starting at age 71. Mammogram. This may be done every 1-2 years. Talk to your health care provider about how often you should have regular mammograms. Talk with your health care provider about your test results, treatment options, and if necessary, the need for more tests. Vaccines  Your health care provider may recommend certain vaccines, such as: Influenza vaccine. This is  recommended every year. Tetanus, diphtheria, and acellular pertussis (Tdap,  Td) vaccine. You may need a Td booster every 10 years. Zoster vaccine. You may need this after age 31. Pneumococcal 13-valent conjugate (PCV13) vaccine. One dose is recommended after age 31. Pneumococcal polysaccharide (PPSV23) vaccine. One dose is recommended after age 89. Talk to your health care provider about which screenings and vaccines you need and how often you need them. This information is not intended to replace advice given to you by your health care provider. Make sure you discuss any questions you have with your health care provider. Document Released: 09/15/2015 Document Revised: 05/08/2016 Document Reviewed: 06/20/2015 Elsevier Interactive Patient Education  2017 [place] Prevention in the Home Falls can cause injuries. They can happen to people of all ages. There are many things you can do to make your home safe and to help prevent falls. What can I do on the outside of my home? Regularly fix the edges of walkways and driveways and fix any cracks. Remove anything that might make you trip as you walk through a door, such as a raised step or threshold. Trim any bushes or trees on the path to your home. Use bright outdoor lighting. Clear any walking paths of anything that might make someone trip, such as rocks or tools. Regularly check to see if handrails are loose or broken. Make sure that both sides of any steps have handrails. Any raised decks and porches should have guardrails on the edges. Have any leaves, snow, or ice cleared regularly. Use sand or salt on walking paths during winter. Clean up any spills in your garage right away. This includes oil or grease spills. What can I do in the bathroom? Use night lights. Install grab bars by the toilet and in the tub and shower. Do not use towel bars as grab bars. Use non-skid mats or decals in the tub or shower. If you need to sit down in the shower, use a plastic, non-slip stool. Keep the floor dry. Clean up any  water that spills on the floor as soon as it happens. Remove soap buildup in the tub or shower regularly. Attach bath mats securely with double-sided non-slip rug tape. Do not have throw rugs and other things on the floor that can make you trip. What can I do in the bedroom? Use night lights. Make sure that you have a light by your bed that is easy to reach. Do not use any sheets or blankets that are too big for your bed. They should not hang down onto the floor. Have a firm chair that has side arms. You can use this for support while you get dressed. Do not have throw rugs and other things on the floor that can make you trip. What can I do in the kitchen? Clean up any spills right away. Avoid walking on wet floors. Keep items that you use a lot in easy-to-reach places. If you need to reach something above you, use a strong step stool that has a grab bar. Keep electrical cords out of the way. Do not use floor polish or wax that makes floors slippery. If you must use wax, use non-skid floor wax. Do not have throw rugs and other things on the floor that can make you trip. What can I do with my stairs? Do not leave any items on the stairs. Make sure that there are handrails on both sides of the stairs and use them. Fix handrails that are  broken or loose. Make sure that handrails are as long as the stairways. Check any carpeting to make sure that it is firmly attached to the stairs. Fix any carpet that is loose or worn. Avoid having throw rugs at the top or bottom of the stairs. If you do have throw rugs, attach them to the floor with carpet tape. Make sure that you have a light switch at the top of the stairs and the bottom of the stairs. If you do not have them, ask someone to add them for you. What else can I do to help prevent falls? Wear shoes that: Do not have high heels. Have rubber bottoms. Are comfortable and fit you well. Are closed at the toe. Do not wear sandals. If you use a  stepladder: Make sure that it is fully opened. Do not climb a closed stepladder. Make sure that both sides of the stepladder are locked into place. Ask someone to hold it for you, if possible. Clearly mark and make sure that you can see: Any grab bars or handrails. First and last steps. Where the edge of each step is. Use tools that help you move around (mobility aids) if they are needed. These include: Canes. Walkers. Scooters. Crutches. Turn on the lights when you go into a dark area. Replace any light bulbs as soon as they burn out. Set up your furniture so you have a clear path. Avoid moving your furniture around. If any of your floors are uneven, fix them. If there are any pets around you, be aware of where they are. Review your medicines with your doctor. Some medicines can make you feel dizzy. This can increase your chance of falling. Ask your doctor what other things that you can do to help prevent falls. This information is not intended to replace advice given to you by your health care provider. Make sure you discuss any questions you have with your health care provider. Document Released: 06/15/2009 Document Revised: 01/25/2016 Document Reviewed: 09/23/2014 Elsevier Interactive Patient Education  2017 [employer].

## 2021-02-16 NOTE — Progress Notes (Signed)
Subjective:   [name] is a 85 y.o. female who presents for Medicare Annual (Subsequent) preventive examination.  Review of Systems: N/A     I connected with the patient today by telephone and verified that I am speaking with the correct person using two identifiers. Location patient: home Location nurse: work Persons participating in the telephone visit: patient, nurse.   I discussed the limitations, risks, security and privacy concerns of performing an evaluation and management service by telephone and the availability of in person appointments. I also discussed with the patient that there may be a patient responsible charge related to this service. The patient expressed understanding and verbally consented to this telephonic visit.        Cardiac Risk Factors include: advanced age (>74mn, >>47women)     Objective:    Today's Vitals   There is no height or weight on file to calculate BMI.  Advanced Directives 02/16/2021 02/08/2021 01/18/2021 01/16/2021 09/26/2020 09/26/2020 09/26/2020  Does Patient Have a Medical Advance Directive? Yes Yes Yes Yes Yes Yes -  Type of [place] - - - Living [place] - [place]  Does patient want to make changes to medical advance directive? - - - - No - Patient declined - No - Patient declined  Copy of [place] Chart? No - copy requested - - - - - -    Current Medications (verified) Outpatient Encounter Medications as of 02/16/2021  Medication Sig   Biotin w/ Vitamins C & E (HAIR/SKIN/NAILS PO) Take by mouth.   Cholecalciferol (VITAMIN D3) 125 MCG (5000 UT) CAPS Take 5,000 Int'l Units by mouth.   citalopram (CELEXA) 40 MG tablet TAKE 1 TABLET (40 MG TOTAL) BY MOUTH DAILY. FOR ANXIETY.   denosumab (PROLIA) 60 MG/ML SOSY injection Inject 60 mg into the skin every 6 (six) months.   ESBRIET 267 MG TABS Take 267 mg by mouth 3 (three) times daily  with meals.   HYDROcodone-acetaminophen (NORCO) 7.5-325 MG tablet Take 1 tablet by mouth 3 (three) times daily as needed.   OXYGEN Inhale into the lungs. 2 L at night   sulfaSALAzine (AZULFIDINE) 500 MG tablet Take 1 tablet (500 mg total) by mouth 2 (two) times daily.   vitamin B-12 (CYANOCOBALAMIN) 1000 MCG tablet Take 1,000 mcg by mouth every other day.   VOLTAREN 1 % GEL Apply 2 g topically in the morning, at noon, and at bedtime. Shoulders and back   No facility-administered encounter medications on file as of 02/16/2021.    Allergies (verified) Patient has no known allergies.   History: Past Medical History:  Diagnosis Date   Altered mental state    Anxiety    BMI 30.0-30.9,adult    Cellulitis    Chest pain, atypical    Compression fracture of L4 lumbar vertebra    Constipation    Cystitis    Cystocele    Diverticulosis    Fatigue    Fibrosis, idiopathic pulmonary (HCC)    Gastroenteritis    History of back surgery    History of herniated intervertebral disc    HTN (hypertension)    Hypercholesteremia    Hypocalcemia    Incontinence of urine    OA (osteoarthritis)    shoulder and back   Obesity    Osteopenia    Pneumonia    Shortness of breath    Sleep apnea    Squamous cell carcinoma of skin  07/16/2016   Right upper lateral eyebrow. SCCis.   Squamous cell carcinoma of skin 01/13/2018   Left temporal hairline. WD SCC with superficial infiltration.   Thrush    Vitamin D deficiency    Past Surgical History:  Procedure Laterality Date   BACK SURGERY     CATARACT EXTRACTION W/PHACO Left 04/24/2016   Procedure: CATARACT EXTRACTION PHACO AND INTRAOCULAR LENS PLACEMENT (IOC);  Surgeon: Leandrew Koyanagi, MD;  Location: [facility];  Service: Ophthalmology;  Laterality: Left;   CATARACT EXTRACTION W/PHACO Right 07/14/2017   Procedure: CATARACT EXTRACTION PHACO AND INTRAOCULAR LENS PLACEMENT ([place])  RIGHT;  Surgeon: Leandrew Koyanagi, MD;  Location: [facility];  Service: Ophthalmology;  Laterality: Right;   CHOLECYSTECTOMY     ESOPHAGOGASTRODUODENOSCOPY (EGD) WITH PROPOFOL N/A 10/13/2018   Procedure: ESOPHAGOGASTRODUODENOSCOPY (EGD) WITH PROPOFOL;  Surgeon: [name], MD;  Location[facility] ENDOSCOPY;  Service: Gastroenterology;  Laterality: N/A;   FLEXIBLE SIGMOIDOSCOPY N/A 10/13/2018   Procedure: FLEXIBLE SIGMOIDOSCOPY;  Surgeon: [name], MD;  Location[facility] END[facility]COPY;  Service: Gastroenterology;  Laterality: N/A;   FLEXIBLE SIGMOIDOSCOPY N/A 09/09/2019   Procedure: FLEXIBLE SIGMOIDOSCOPY;  Surgeon: [name], MD;  Location: [facility] ENDOSCOPY;  Service: Gastroenterology;  Laterality: N/A;   FLEXIBLE SIGMOIDOSCOPY N/A 08/04/2020   Procedure: FLEXIBLE SIGMOIDOSCOPY;  Surgeon: [name], MD;  [facility] END[facility]COPY;  Service: Gastroenterology;  Laterality: N/A;  Per Dr. Vicente Males, unsedated   HIP ARTHROPLASTY Right 09/27/2020   Procedure: ARTHROPLASTY BIPOLAR HIP (HEMIARTHROPLASTY);  Surgeon: Hessie Knows, MD;  Location: [facility] ORS;  Service: Orthopedics;  Laterality: Right;   ROTATOR CUFF REPAIR Bilateral    left-12/2009; right-03/2009 dr.[email or web address]   TONSILLECTOMY     TOTAL SHOULDER REPLACEMENT Left    Family History  Problem Relation Age of Onset   COPD Mother    Cancer Brother        colon   Social History   Socioeconomic History   Marital status: Married    Spouse name: [name]   Number of children: 2   Years of education: Not on file   Highest education level: Not on file  Occupational History   Not on file  Tobacco Use   Smoking status: Never   Smokeless tobacco: Never  Vaping Use   Vaping Use: Never used  Substance and Sexual Activity   Alcohol use: No   Drug use: No   Sexual activity: Never    Birth control/protection: None  Other Topics Concern   Not on file  Social History Narrative   Not on file   Social Determinants of Health   Financial Resource Strain: Low Risk    Difficulty of Paying Living Expenses: Not  hard at all  Food Insecurity: No Food Insecurity   Worried About Charity fundraiser in the Last Year: Never true   [place] in the Last Year: Never true  Transportation Needs: No Transportation Needs   Lack of Transportation (Medical): No   Lack of Transportation (Non-Medical): No  Physical Activity: Inactive   Days of Exercise per Week: 0 days   Minutes of Exercise per Session: 0 min  Stress: Stress Concern Present   Feeling of Stress : To some extent  Social Connections: Not on file    Tobacco Counseling Counseling given: Not Answered   Clinical Intake:  Pre-visit preparation completed: Yes  Pain : 0-10 Pain Type: Chronic pain Pain Location: Back Pain Descriptors / Indicators: Aching Pain Onset: More than a month ago Pain Frequency: Intermittent  Nutritional Risks: None Diabetes: No  How often do you need to have someone help you when you read instructions, pamphlets, or other written materials from your doctor or pharmacy?: 1 - Never What is the last grade level you completed in school?: 1 year of business  Diabetic: No Nutrition Risk Assessment:  Has the patient had any N/V/D within the last 2 months?  No  Does the patient have any non-healing wounds?  No  Has the patient had any unintentional weight loss or weight gain?  No   Diabetes:  Is the patient diabetic?  No  If diabetic, was a CBG obtained today?   N/A Did the patient bring in their glucometer from home?   N/A How often do you monitor your CBG's? N/A.   Financial Strains and Diabetes Management:  Are you having any financial strains with the device, your supplies or your medication?  N/A .  Does the patient want to be seen by Chronic Care Management for management of their diabetes?   N/A Would the patient like to be referred to a Nutritionist or for Diabetic Management?   N/A   Interpreter Needed?: No  Information entered by :: CJohnson, LPN   Activities of Daily Living In your  present state of health, do you have any difficulty performing the following activities: 02/16/2021 09/26/2020  Hearing? N N  Vision? N N  Difficulty concentrating or making decisions? N N  Walking or climbing stairs? N N  Dressing or bathing? N N  Doing errands, shopping? N N  Preparing Food and eating ? N -  Using the Toilet? N -  In the past six months, have you accidently leaked urine? N -  Do you have problems with loss of bowel control? N -  Managing your Medications? N -  Managing your Finances? N -  Housekeeping or managing your Housekeeping? N -  Some recent data might be hidden    Patient Care Team: [name], NP as PCP - General (Internal Medicine) Leandrew Koyanagi, MD as Referring Physician (Ophthalmology) Erby Pian, MD as Referring Physician (Specialist) Yolonda Kida, MD as Consulting Physician (Cardiology) Rubie Maid, MD as Referring Physician (Obstetrics and Gynecology) Catalina Antigua as Referring Physician (Physician Assistant) Thornton Park, MD as Referring Physician (Orthopedic Surgery)  Indicate any recent Medical Services you may have received from other than [facility] providers in the past year (date may be approximate).     Assessment:   This is a routine wellness examination for Ruthanna.  Hearing/Vision screen Vision Screening - Comments:: Patient gets annual eye exams   Dietary issues and exercise activities discussed: Current Exercise Habits: The patient does not participate in regular exercise at present, Exercise limited by: orthopedic condition(s)   Goals Addressed             This Visit's Progress    Patient Stated       02/16/2021, I will maintain and continue medications as prescribed.         Depression Screen PHQ 2/9 Scores 02/16/2021 02/08/2021 01/18/2021 01/16/2021 10/24/2020 06/13/2020 04/16/2017  PHQ - 2 Score 0 0 0 0 1 0 0  PHQ- 9 Score 0 - - - - 0 -    Fall Risk Fall Risk  02/16/2021 02/13/2021  02/08/2021 01/18/2021 01/16/2021  Falls in the past year? 0 0 0 0 0  Number falls in past yr: 0 - - - -  Injury with Fall? 0 - - - -  Comment - - - - -  Risk for fall due to : Medication side effect - - - -  Follow up Falls evaluation completed;Falls prevention discussed - - - -    FALL RISK PREVENTION PERTAINING TO THE HOME:  Any stairs in or around the home? Yes  If so, are there any without handrails? No  Home free of loose throw rugs in walkways, pet beds, electrical cords, etc? Yes  Adequate lighting in your home to reduce risk of falls? Yes   ASSISTIVE DEVICES UTILIZED TO PREVENT FALLS:  Life alert? No  Use of a cane, walker or w/c? No  Grab bars in the bathroom? No  Shower chair or bench in shower? No  Elevated toilet seat or a handicapped toilet? No   TIMED UP AND GO:  Was the test performed?  N/A telephone visit .   Cognitive Function: MMSE - Mini Mental State Exam 02/16/2021  Not completed: Refused       Mini Cog  Mini-Cog screen was not completed. Patient refused. Maximum score is 22. A value of 0 denotes this part of the MMSE was not completed or the patient failed this part of the Mini-Cog screening.  Immunizations Immunization History  Administered Date(s) Administered   Fluad Quad(high Dose 65+) 04/30/2019   Influenza, High Dose Seasonal PF 06/21/2015, 05/18/2016, 05/31/2017, 06/18/2018   Influenza-Unspecified 05/11/2020   PFIZER Comirnaty(Gray Top)Covid-19 Tri-Sucrose Vaccine 10/02/2020   PFIZER(Purple Top)SARS-COV-2 Vaccination 10/27/2019, 11/17/2019   Pneumococcal Conjugate-13 04/18/2014   Pneumococcal Polysaccharide-23 07/24/2004, 12/28/2013   Td 09/02/2001   Tdap 05/25/2011   Zoster Recombinat (Shingrix) 04/14/2020   Zoster, Live 04/19/2010    TDAP status: Up to date  Flu Vaccine status: Up to date  Pneumococcal vaccine status: Up to date  Covid-19 vaccine status: Completed 3 Vaccines. Second booster scheduled 02/19/2021 per patient    Qualifies for Shingles Vaccine? Yes   Zostavax completed Yes   Shingrix Completed?: No.    Education has been provided regarding the importance of this vaccine. Patient has been advised to call insurance company to determine out of pocket expense if they have not yet received this vaccine. Advised may also receive vaccine at local pharmacy or Health Dept. Verbalized acceptance and understanding.  Screening Tests Health Maintenance  Topic Date Due   Zoster Vaccines- Shingrix (2 of 2) 06/09/2020   COVID-19 Vaccine (4 - Booster for Pfizer series) 12/30/2020   INFLUENZA VACCINE  04/02/2021   TETANUS/TDAP  05/24/2021   DEXA SCAN  Completed   PNA vac Low Risk Adult  Completed   HPV VACCINES  Aged Out    Health Maintenance  Health Maintenance Due  Topic Date Due   Zoster Vaccines- Shingrix (2 of 2) 06/09/2020   COVID-19 Vaccine (4 - Booster for Pfizer series) 12/30/2020    Colorectal cancer screening: No longer required.   Mammogram status: No longer required due to age.  Bone Density status: due, will discuss with provider   Lung Cancer Screening: (Low Dose CT Chest recommended if Age 42-80 years, 30 pack-year currently smoking OR have quit w/in 15years.) does not qualify.  Additional Screening:  Hepatitis C Screening: does not qualify; Completed N/A  Vision Screening: Recommended annual ophthalmology exams for early detection of glaucoma and other disorders of the eye. Is the patient up to date with their annual eye exam?  Yes  Who is the provider or what is the name of the office in which the patient attends annual eye exams? [facility]  If pt is not established with a provider, would they like to be referred to a provider to establish care? No .   Dental Screening: Recommended annual dental exams for proper oral hygiene  Community Resource Referral / Chronic Care Management: CRR required this visit?  No   CCM required this visit?  No      Plan:     I have  personally reviewed and noted the following in the patient's chart:   Medical and social history Use of alcohol, tobacco or illicit drugs  Current medications and supplements including opioid prescriptions.  Functional ability and status Nutritional status Physical activity Advanced directives List of other physicians Hospitalizations, surgeries, and ER visits in previous 12 months Vitals Screenings to include cognitive, depression, and falls Referrals and appointments  In addition, I have reviewed and discussed with patient certain preventive protocols, quality metrics, and best practice recommendations. A written personalized care plan for preventive services as well as general preventive health recommendations were provided to patient.   Due to this being a telephonic visit, the after visit summary with patients personalized plan was offered to patient via office or my-chart. Patient preferred to pick up at office at next visit or via mychart.   Andrez Grime, LPN   [id number]

## 2021-02-16 NOTE — Progress Notes (Signed)
PCP notes:  Health Maintenance: Dexa- due    Abnormal Screenings: none   Patient concerns: Ongoing fatigue    Nurse concerns: none   Next PCP appt.: 02/23/2021 @ 9:40 am

## 2021-02-22 ENCOUNTER — Other Ambulatory Visit: Payer: Self-pay

## 2021-02-22 ENCOUNTER — Ambulatory Visit (INDEPENDENT_AMBULATORY_CARE_PROVIDER_SITE_OTHER): Payer: Medicare Other | Admitting: Gastroenterology

## 2021-02-22 ENCOUNTER — Encounter: Payer: Self-pay | Admitting: Gastroenterology

## 2021-02-22 VITALS — BP 134/81 | HR 89 | Temp 98.1°F | Ht 60.0 in | Wt 131.6 lb

## 2021-02-22 DIAGNOSIS — K5190 Ulcerative colitis, unspecified, without complications: Secondary | ICD-10-CM

## 2021-02-22 MED ORDER — SULFASALAZINE 500 MG PO TABS: 500.0000 mg | ORAL_TABLET | Freq: Two times a day (BID) | ORAL | 3 refills | Status: DC

## 2021-02-22 NOTE — Progress Notes (Signed)
Terri Bellows MD, MRCP(U.K) [address]  [place]  [place], [place] [address]  Main: [phone]  Fax: [phone]   Primary Care Physician: [name], NP  Primary Gastroenterologist:  Dr. Jonathon [name]   Chief complaint follow-up for ulcerative colitis   HPI: [name] is a 85 y.o. female  Summary of history :   She was initially referred by her primary care physician in January 2020 with  diarrhea for over 2 months.  Since approximately early 2021 she has been on treatment with sulfasalazine for ulcerative pancolitis  She was seen in 2018 by [facility] clinic GI  in 08/2008 she had a colonoscopy as per the last GI note showed chronic colitis with focal cryptitis.  Focal active colitis.   Not sure why it was not felt that this patient had inflammatory bowel disease.  At her initial visit she had been on Meloxicam for about 2-3  Months, 2 tablets a day and no other NSAID's.  She also did have blood in her stool at that point of time.   10/13/2018: EGD: normal . Sigmoidoscopy Discontinuous areas of nonbleeding ulcerated mucosa with no stigmata of recent bleeding were present in the sigmoid colon, in thedescending colon and in the distal transverse colon. Biopsies were taken with a cold forceps for histology.Rectum was normal. Pathology report showed chronic active colitis and normal rectal biopsies. HSV and CMV negative.    In February 2021 when she came to see me she had stopped meloxicam and doing well.  Was not having any diarrhea.   09/09/2019: Sigmoidoscopy: I went all the way to her mid transverse colon.  No evidence of colitis was seen.  There was a single solitary ulcer in the proximal descending colon.  No bleeding was seen.  Biopsies were taken with forceps.  And it demonstrated chronic colitis with mild to moderate activity and small crypt abscess. 09/13/2019: Vitamin D normal, hemoglobin 13.7 with a platelet count of 224 and a white cell count of 9.8.  CMP was  normal.   06/02/2020 CRP 0.7, CMP showed a calcium of 8.7 normal AST and ALT.  Hemoglobin on 06/01/2020 showed a normal study with normal differential count    Treated in  07/03/2020 for C. difficile diarrhea.  on 07/11/2020 informed that the diarrhea was recurring when she went off the vancomycin.  I subsequently suggested her to increase her dose back to 4 times a day for 3 weeks with a plan to gradually taper off.   08/04/2020: Flexible sigmoidoscopy: The mucosa of the entire examined colon appeared normal diverticulosis noted.  Biopsies taken.  Showed focal mild active colitis in the left colon and the rectum showed no active colitis.    Interval history 10/25/2020-02/22/2021   01/03/2021: CRP less than 1, vitamin D normal, B12 normal 10/24/2020: Hemoglobin 12.8 g.  Since her last visit she is doing well.  1-2 bowel movements a day.  No nocturnal symptoms.  No cramping.  She does have some loose stools occasionally and she uses sweetened low with her coffee which I suspect may be part of the reason.  No other complaints.  Taking sulfasalazine 2 tablets a day.    Current Outpatient Medications  Medication Sig Dispense Refill   Biotin w/ Vitamins C & E (HAIR/SKIN/NAILS PO) Take by mouth.     Cholecalciferol (VITAMIN D3) 125 MCG (5000 UT) CAPS Take 5,000 Int'l Units by mouth.     citalopram (CELEXA) 40 MG tablet TAKE 1 TABLET (  40 MG TOTAL) BY MOUTH DAILY. FOR ANXIETY. 90 tablet 1   denosumab (PROLIA) 60 MG/ML SOSY injection Inject 60 mg into the skin every 6 (six) months.     ESBRIET 267 MG TABS Take 267 mg by mouth 3 (three) times daily with meals.     HYDROcodone-acetaminophen (NORCO) 7.5-325 MG tablet Take 1 tablet by mouth 3 (three) times daily as needed.     OXYGEN Inhale into the lungs. 2 L at night     sulfaSALAzine (AZULFIDINE) 500 MG tablet Take 1 tablet (500 mg total) by mouth 2 (two) times daily. 180 tablet 1   vitamin B-12 (CYANOCOBALAMIN) 1000 MCG tablet Take 1,000 mcg by mouth every  other day.     VOLTAREN 1 % GEL Apply 2 g topically in the morning, at noon, and at bedtime. Shoulders and back  2   No current facility-administered medications for this visit.    Allergies as of 02/22/2021   (No Known Allergies)    ROS:  General: Negative for anorexia, weight loss, fever, chills, fatigue, weakness. ENT: Negative for hoarseness, difficulty swallowing , nasal congestion. CV: Negative for chest pain, angina, palpitations, dyspnea on exertion, peripheral edema.  Respiratory: Negative for dyspnea at rest, dyspnea on exertion, cough, sputum, wheezing.  GI: See history of present illness. GU:  Negative for dysuria, hematuria, urinary incontinence, urinary frequency, nocturnal urination.  Endo: Negative for unusual weight change.    Physical Examination:   BP 134/81   Pulse 89   Temp 98.1 F (36.7 C) (Oral)   Ht 5' (1.524 m)   Wt 131 lb 9.6 oz (59.7 kg)   BMI 25.70 kg/m   General: Well-nourished, well-developed in no acute distress.  Eyes: No icterus. Conjunctivae pink. Mouth: Oropharyngeal mucosa moist and pink , no lesions erythema or exudate. Lungs: Clear to auscultation bilaterally. Non-labored. Heart: Regular rate and rhythm, no murmurs rubs or gallops.  Abdomen: Bowel sounds are normal, nontender, nondistended, no hepatosplenomegaly or masses, no abdominal bruits or hernia , no rebound or guarding.   Extremities: No lower extremity edema. No clubbing or deformities. Neuro: Alert and oriented x 3.  Grossly intact. Skin: Warm and dry, no jaundice.   Psych: Alert and cooperative, normal mood and affect.   Imaging Studies: DG PAIN CLINIC C-ARM 1-60 MIN NO REPORT  Result Date: 02/13/2021 Fluoro was used, but no Radiologist interpretation will be provided. Please refer to "NOTES" tab for provider progress note.   Assessment and Plan:   [name] is a 85 y.o. y/o female here to follow-up for chronic ulcerative colitis and has been doing well on  sulfasalazine with no other GI symptoms.  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  She is up-to-date with her tetanus, influenza, DEXA scan and pneumococcal vaccination.  Health maintenance indicates she she has received Shingrix dose in 04/21/2020 and COVID vaccination.   Plan 1.   Continue sulfasalazine 2 tablets a day refill provided 2.  Would recommend [name], NP to give her pneumococcal vaccine if she is not up-to-date as she is immunocompromised 3.  Recommend checking CBC every 4 to 59-monthinterval with CPleas Koch NP sulfasalazine can occasionally cause of agranulocytosis   Dr KJonathon Bellows MD,MRCP ([facility] Follow up in 658-month

## 2021-02-23 ENCOUNTER — Other Ambulatory Visit: Payer: Self-pay

## 2021-02-23 ENCOUNTER — Ambulatory Visit (INDEPENDENT_AMBULATORY_CARE_PROVIDER_SITE_OTHER): Payer: Medicare Other | Admitting: Primary Care

## 2021-02-23 ENCOUNTER — Encounter: Payer: Self-pay | Admitting: Primary Care

## 2021-02-23 VITALS — BP 124/80 | HR 76 | Temp 97.6°F | Ht 60.0 in | Wt 130.0 lb

## 2021-02-23 DIAGNOSIS — E559 Vitamin D deficiency, unspecified: Secondary | ICD-10-CM

## 2021-02-23 DIAGNOSIS — R5383 Other fatigue: Secondary | ICD-10-CM

## 2021-02-23 DIAGNOSIS — J8410 Pulmonary fibrosis, unspecified: Secondary | ICD-10-CM | POA: Diagnosis not present

## 2021-02-23 DIAGNOSIS — E7800 Pure hypercholesterolemia, unspecified: Secondary | ICD-10-CM | POA: Diagnosis not present

## 2021-02-23 DIAGNOSIS — A0472 Enterocolitis due to Clostridium difficile, not specified as recurrent: Secondary | ICD-10-CM | POA: Diagnosis not present

## 2021-02-23 DIAGNOSIS — M47816 Spondylosis without myelopathy or radiculopathy, lumbar region: Secondary | ICD-10-CM | POA: Diagnosis not present

## 2021-02-23 DIAGNOSIS — E538 Deficiency of other specified B group vitamins: Secondary | ICD-10-CM

## 2021-02-23 DIAGNOSIS — Q782 Osteopetrosis: Secondary | ICD-10-CM | POA: Diagnosis not present

## 2021-02-23 DIAGNOSIS — F419 Anxiety disorder, unspecified: Secondary | ICD-10-CM | POA: Diagnosis not present

## 2021-02-23 LAB — CBC
HCT: 41.6 % (ref 36.0–46.0)
Hemoglobin: 13.9 g/dL (ref 12.0–15.0)
MCHC: 33.5 g/dL (ref 30.0–36.0)
MCV: 98.5 fl (ref 78.0–100.0)
Platelets: 300 10*3/uL (ref 150.0–400.0)
RBC: 4.22 Mil/uL (ref 3.87–5.11)
RDW: 14.5 % (ref 11.5–15.5)
WBC: 7.5 10*3/uL (ref 4.0–10.5)

## 2021-02-23 LAB — LIPID PANEL
Cholesterol: 251 mg/dL — ABNORMAL HIGH (ref 0–200)
HDL: 86 mg/dL (ref >39.00)
LDL Cholesterol: 141 mg/dL — ABNORMAL HIGH (ref 0–99)
NonHDL: 165.19
Total CHOL/HDL Ratio: 3
Triglycerides: 119 mg/dL (ref 0.0–149.0)
VLDL: 23.8 mg/dL (ref 0.0–40.0)

## 2021-02-23 NOTE — Assessment & Plan Note (Signed)
Recent levels reviewed and are stable.

## 2021-02-23 NOTE — Assessment & Plan Note (Signed)
Following with orthopedics.

## 2021-02-23 NOTE — Assessment & Plan Note (Signed)
Chronic, leads a very sedentary lifestyle. Offered PT for which she declines.  No falls. She will try to increase activity.

## 2021-02-23 NOTE — Assessment & Plan Note (Signed)
Following with pain management and orthopedics.  Continue hydrocodone-acetaminophen 7.5-325 mg.

## 2021-02-23 NOTE — Assessment & Plan Note (Signed)
Well controlled, follows with pulmonology. Continue Esbriet 267 mg.

## 2021-02-23 NOTE — Assessment & Plan Note (Signed)
Doing well on citalopram 40 mg, continue same.

## 2021-02-23 NOTE — Assessment & Plan Note (Signed)
Prior history, repeat labs pending. Not on treatment, continue off.

## 2021-02-23 NOTE — Patient Instructions (Signed)
Stop by the lab prior to leaving today. I will notify you of your results once received.   It was a pleasure to see you today!

## 2021-02-23 NOTE — Assessment & Plan Note (Signed)
Following with GI, no recurrence. Continue sulfasalazine 500 mg.

## 2021-02-23 NOTE — Progress Notes (Addendum)
Subjective:    Patient ID: [name], female    DOB: 1935-10-27, 85 y.o.   MRN: [id number]  HPI  YUMALAY CIRCLE is a very pleasant 85 y.o. female with a history of pulmonary fibrosis, c-diff, chronic back pain, fatigue who presents today for follow up of chronic conditions.  Following with pulmonology for pulmonary fibrosis. Is doing well on Esbriet 267 mg. Last visit was in May 2022, no changes.   Managed on citalopram 40 mg for anxiety, overall has noticed improvement since the dose was increase. Denies concerns today.   Following with GI for Ulcerative Colitis and c-diff and is managed on sulfasalazine 500 mg. Last visit was yesterday, no changes made, she is doing well.   Continues to follow with pain management and orthopedics, is managed on hydrocodone-acetaminophen, no changes made. Bone density scan is UTD, completed by orthopedics.   She would also like to discuss weakness to her legs. Chronic for years. Is mostly sedentary during the day. She denies falls. History of chronic back pain.   BP Readings from Last 3 Encounters:  02/23/21 124/80  02/22/21 134/81  02/13/21 (!) 155/62      Review of Systems  Eyes:  Negative for visual disturbance.  Respiratory:  Positive for shortness of breath.   Gastrointestinal:  Negative for abdominal pain.  Neurological:  Negative for dizziness and headaches.  Psychiatric/Behavioral:  The patient is not nervous/anxious.         Past Medical History:  Diagnosis Date   Altered mental state    Anxiety    BMI 30.0-30.9,adult    Cellulitis    Chest pain, atypical    Compression fracture of L4 lumbar vertebra    Constipation    Cystitis    Cystocele    Diverticulosis    Fatigue    Fibrosis, idiopathic pulmonary (HCC)    Gastroenteritis    History of back surgery    History of herniated intervertebral disc    HTN (hypertension)    Hypercholesteremia    Hypocalcemia    Incontinence of urine    OA (osteoarthritis)     shoulder and back   Obesity    Osteopenia    Pneumonia    Shortness of breath    Sleep apnea    Squamous cell carcinoma of skin 07/16/2016   Right upper lateral eyebrow. SCCis.   Squamous cell carcinoma of skin 01/13/2018   Left temporal hairline. WD SCC with superficial infiltration.   Thrush    Vitamin D deficiency     Social History   Socioeconomic History   Marital status: Married    Spouse name: [name]   Number of children: 2   Years of education: Not on file   Highest education level: Not on file  Occupational History   Not on file  Tobacco Use   Smoking status: Never   Smokeless tobacco: Never  Vaping Use   Vaping Use: Never used  Substance and Sexual Activity   Alcohol use: No   Drug use: No   Sexual activity: Never    Birth control/protection: None  Other Topics Concern   Not on file  Social History Narrative   Not on file   Social Determinants of Health   Financial Resource Strain: Low Risk    Difficulty of Paying Living Expenses: Not hard at all  Food Insecurity: No Food Insecurity   Worried About [place] in the Last Year: Never true   Ran Out  of Food in the Last Year: Never true  Transportation Needs: No Transportation Needs   Lack of Transportation (Medical): No   Lack of Transportation (Non-Medical): No  Physical Activity: Inactive   Days of Exercise per Week: 0 days   Minutes of Exercise per Session: 0 min  Stress: Stress Concern Present   Feeling of Stress : To some extent  Social Connections: Not on file  Intimate Partner Violence: Not At Risk   Fear of Current or Ex-Partner: No   Emotionally Abused: No   Physically Abused: No   Sexually Abused: No    Past Surgical History:  Procedure Laterality Date   BACK SURGERY     CATARACT EXTRACTION W/PHACO Left 04/24/2016   Procedure: CATARACT EXTRACTION PHACO AND INTRAOCULAR LENS PLACEMENT (IOC);  Surgeon: Leandrew Koyanagi, MD;  Location: [place];  Service:  Ophthalmology;  Laterality: Left;   CATARACT EXTRACTION W/PHACO Right 07/14/2017   Procedure: CATARACT EXTRACTION PHACO AND INTRAOCULAR LENS PLACEMENT ([place])  RIGHT;  Surgeon: Leandrew Koyanagi, MD;  Location: [place];  Service: Ophthalmology;  Laterality: Right;   CHOLECYSTECTOMY     ESOPHAGOGASTRODUODENOSCOPY (EGD) WITH PROPOFOL N/A 10/13/2018   Procedure: ESOPHAGOGASTRODUODENOSCOPY (EGD) WITH PROPOFOL;  Surgeon: [name], MD;  Location[facility] ENDOSCOPY;  Service: Gastroenterology;  Laterality: N/A;   FLEXIBLE SIGMOIDOSCOPY N/A 10/13/2018   Procedure: FLEXIBLE SIGMOIDOSCOPY;  Surgeon: [name], MD;  Location: [facility] ENDOSCOPY;  Service: Gastroenterology;  Laterality: N/A;   FLEXIBLE SIGMOIDOSCOPY N/A 09/09/2019   Procedure: FLEXIBLE SIGMOIDOSCOPY;  Surgeon: [name], MD;  Location: [facility] ENDOSCOPY;  Service: Gastroenterology;  Laterality: N/A;   FLEXIBLE SIGMOIDOSCOPY N/A 08/04/2020   Procedure: FLEXIBLE SIGMOIDOSCOPY;  Surgeon: [name], MD;  Location[facility]COPY;  Service: Gastroenterology;  Laterality: N/A;  Per Dr. Vicente Males, unsedated   HIP ARTHROPLASTY Right 09/27/2020   Procedure: ARTHROPLASTY BIPOLAR HIP (HEMIARTHROPLASTY);  Surgeon: Hessie Knows, MD;  Location: [facility] ORS;  Service: Orthopedics;  Laterality: Right;   ROTATOR CUFF REPAIR Bilateral    left-12/2009; right-03/2009 dr.califf   TONSILLECTOMY     TOTAL SHOULDER REPLACEMENT Left     Family History  Problem Relation Age of Onset   COPD Mother    Cancer Brother        colon    No Known Allergies  Current Outpatient Medications on File Prior to Visit  Medication Sig Dispense Refill   Cholecalciferol (VITAMIN D3) 125 MCG (5000 UT) CAPS Take 5,000 Int'l Units by mouth.     citalopram (CELEXA) 40 MG tablet TAKE 1 TABLET (40 MG TOTAL) BY MOUTH DAILY. FOR ANXIETY. 90 tablet 1   denosumab (PROLIA) 60 MG/ML SOSY injection Inject 60 mg into the skin every 6 (six) months.     ESBRIET 267 MG TABS Take 267 mg by  mouth 3 (three) times daily with meals.     HYDROcodone-acetaminophen (NORCO) 7.5-325 MG tablet Take 1 tablet by mouth 3 (three) times daily as needed.     OXYGEN Inhale into the lungs. 2 L at night     sulfaSALAzine (AZULFIDINE) 500 MG tablet Take 1 tablet (500 mg total) by mouth 2 (two) times daily. 180 tablet 3   vitamin B-12 (CYANOCOBALAMIN) 1000 MCG tablet Take 1,000 mcg by mouth every other day.     VOLTAREN 1 % GEL Apply 2 g topically in the morning, at noon, and at bedtime. Shoulders and back  2   No current facility-administered medications on file prior to visit.    BP 124/80   Pulse 76  Temp 97.6 F (36.4 C) (Temporal)   Ht 5' (1.524 m)   Wt 130 lb (59 kg)   SpO2 94%   BMI 25.39 kg/m  Objective:   Physical Exam Cardiovascular:     Rate and Rhythm: Normal rate and regular rhythm.  Pulmonary:     Effort: Pulmonary effort is normal.     Breath sounds: No wheezing or rhonchi.     Comments: Crackles noted, chronic.  Musculoskeletal:     Cervical back: Neck supple.  Skin:    General: Skin is warm and dry.  Psychiatric:        Mood and Affect: Mood normal.          Assessment & Plan:      This visit occurred during the SARS-CoV-2 public health emergency.  Safety protocols were in place, including screening questions prior to the visit, additional usage of staff PPE, and extensive cleaning of exam room while observing appropriate contact time as indicated for disinfecting solutions.

## 2021-02-27 ENCOUNTER — Other Ambulatory Visit: Payer: Self-pay

## 2021-02-27 ENCOUNTER — Ambulatory Visit: Payer: Medicare Other | Attending: Pain Medicine | Admitting: Pain Medicine

## 2021-02-27 VITALS — BP 147/66 | HR 70 | Temp 97.0°F | Resp 18 | Ht 60.0 in | Wt 127.0 lb

## 2021-02-27 DIAGNOSIS — M5416 Radiculopathy, lumbar region: Secondary | ICD-10-CM | POA: Insufficient documentation

## 2021-02-27 DIAGNOSIS — S32030S Wedge compression fracture of third lumbar vertebra, sequela: Secondary | ICD-10-CM | POA: Diagnosis not present

## 2021-02-27 DIAGNOSIS — M47816 Spondylosis without myelopathy or radiculopathy, lumbar region: Secondary | ICD-10-CM | POA: Diagnosis not present

## 2021-02-27 DIAGNOSIS — S32010S Wedge compression fracture of first lumbar vertebra, sequela: Secondary | ICD-10-CM | POA: Insufficient documentation

## 2021-02-27 DIAGNOSIS — S22080S Wedge compression fracture of T11-T12 vertebra, sequela: Secondary | ICD-10-CM | POA: Diagnosis not present

## 2021-02-27 DIAGNOSIS — G8929 Other chronic pain: Secondary | ICD-10-CM | POA: Diagnosis not present

## 2021-02-27 DIAGNOSIS — S32040S Wedge compression fracture of fourth lumbar vertebra, sequela: Secondary | ICD-10-CM | POA: Insufficient documentation

## 2021-02-27 DIAGNOSIS — M545 Low back pain, unspecified: Secondary | ICD-10-CM | POA: Diagnosis not present

## 2021-02-27 NOTE — Patient Instructions (Signed)
____________________________________________________________________________________________  Preparing for Procedure with Sedation  Procedure appointments are limited to planned procedures: No Prescription Refills. No disability issues will be discussed. No medication changes will be discussed.  Instructions: Oral Intake: Do not eat or drink anything for at least 8 hours prior to your procedure. (Exception: Blood Pressure Medication. See below.) Transportation: Unless otherwise stated by your physician, you may drive yourself after the procedure. Blood Pressure Medicine: Do not forget to take your blood pressure medicine with a sip of water the morning of the procedure. If your Diastolic (lower reading)is above 100 mmHg, elective cases will be cancelled/rescheduled. Blood thinners: These will need to be stopped for procedures. Notify our staff if you are taking any blood thinners. Depending on which one you take, there will be specific instructions on how and when to stop it. Diabetics on insulin: Notify the staff so that you can be scheduled 1st case in the morning. If your diabetes requires high dose insulin, take only  of your normal insulin dose the morning of the procedure and notify the staff that you have done so. Preventing infections: Shower with an antibacterial soap the morning of your procedure. Build-up your immune system: Take 1000 mg of Vitamin C with every meal (3 times a day) the day prior to your procedure. Antibiotics: Inform the staff if you have a condition or reason that requires you to take antibiotics before dental procedures. Pregnancy: If you are pregnant, call and cancel the procedure. Sickness: If you have a cold, fever, or any active infections, call and cancel the procedure. Arrival: You must be in the facility at least 30 minutes prior to your scheduled procedure. Children: Do not bring children with you. Dress appropriately: Bring dark clothing that you would  not mind if they get stained. Valuables: Do not bring any jewelry or valuables.  Reasons to call and reschedule or cancel your procedure: (Following these recommendations will minimize the risk of a serious complication.) Surgeries: Avoid having procedures within 2 weeks of any surgery. (Avoid for 2 weeks before or after any surgery). Flu Shots: Avoid having procedures within 2 weeks of a flu shots or . (Avoid for 2 weeks before or after immunizations). Barium: Avoid having a procedure within 7-10 days after having had a radiological study involving the use of radiological contrast. (Myelograms, Barium swallow or enema study). Heart attacks: Avoid any elective procedures or surgeries for the initial 6 months after a "Myocardial Infarction" (Heart Attack). Blood thinners: It is imperative that you stop these medications before procedures. Let us know if you if you take any blood thinner.  Infection: Avoid procedures during or within two weeks of an infection (including chest colds or gastrointestinal problems). Symptoms associated with infections include: Localized redness, fever, chills, night sweats or profuse sweating, burning sensation when voiding, cough, congestion, stuffiness, runny nose, sore throat, diarrhea, nausea, vomiting, cold or Flu symptoms, recent or current infections. It is specially important if the infection is over the area that we intend to treat. Heart and lung problems: Symptoms that may suggest an active cardiopulmonary problem include: cough, chest pain, breathing difficulties or shortness of breath, dizziness, ankle swelling, uncontrolled high or unusually low blood pressure, and/or palpitations. If you are experiencing any of these symptoms, cancel your procedure and contact your primary care physician for an evaluation.  Remember:  Regular Business hours are:  Monday to Thursday 8:00 AM to 4:00 PM  Provider's Schedule: Milinda Pointer, MD:  Procedure days: Tuesday and  Thursday 7:30 AM  to 4:00 PM  Gillis Santa, MD:  Procedure days: Monday and Wednesday 7:30 AM to 4:00 PM ____________________________________________________________________________________________  ____________________________________________________________________________________________  General Risks and Possible Complications  Patient Responsibilities: It is important that you read this as it is part of your informed consent. It is our duty to inform you of the risks and possible complications associated with treatments offered to you. It is your responsibility as a patient to read this and to ask questions about anything that is not clear or that you believe was not covered in this document.  Patient's Rights: You have the right to refuse treatment. You also have the right to change your mind, even after initially having agreed to have the treatment done. However, under this last option, if you wait until the last second to change your mind, you may be charged for the materials used up to that point.  Introduction: Medicine is not an Chief Strategy Officer. Everything in Medicine, including the lack of treatment(s), carries the potential for danger, harm, or loss (which is by definition: Risk). In Medicine, a complication is a secondary problem, condition, or disease that can aggravate an already existing one. All treatments carry the risk of possible complications. The fact that a side effects or complications occurs, does not imply that the treatment was conducted incorrectly. It must be clearly understood that these can happen even when everything is done following the highest safety standards.  No treatment: You can choose not to proceed with the proposed treatment alternative. The "PRO(s)" would include: avoiding the risk of complications associated with the therapy. The "CON(s)" would include: not getting any of the treatment benefits. These benefits fall under one of three categories: diagnostic;  therapeutic; and/or palliative. Diagnostic benefits include: getting information which can ultimately lead to improvement of the disease or symptom(s). Therapeutic benefits are those associated with the successful treatment of the disease. Finally, palliative benefits are those related to the decrease of the primary symptoms, without necessarily curing the condition (example: decreasing the pain from a flare-up of a chronic condition, such as incurable terminal cancer).  General Risks and Complications: These are associated to most interventional treatments. They can occur alone, or in combination. They fall under one of the following six (6) categories: no benefit or worsening of symptoms; bleeding; infection; nerve damage; allergic reactions; and/or death. No benefits or worsening of symptoms: In Medicine there are no guarantees, only probabilities. No healthcare provider can ever guarantee that a medical treatment will work, they can only state the probability that it may. Furthermore, there is always the possibility that the condition may worsen, either directly, or indirectly, as a consequence of the treatment. Bleeding: This is more common if the patient is taking a blood thinner, either prescription or over the counter (example: Goody Powders, Fish oil, Aspirin, Garlic, etc.), or if suffering a condition associated with impaired coagulation (example: Hemophilia, cirrhosis of the liver, low platelet counts, etc.). However, even if you do not have one on these, it can still happen. If you have any of these conditions, or take one of these drugs, make sure to notify your treating physician. Infection: This is more common in patients with a compromised immune system, either due to disease (example: diabetes, cancer, human immunodeficiency virus [HIV], etc.), or due to medications or treatments (example: therapies used to treat cancer and rheumatological diseases). However, even if you do not have one on  these, it can still happen. If you have any of these conditions, or take one of  these drugs, make sure to notify your treating physician. Nerve Damage: This is more common when the treatment is an invasive one, but it can also happen with the use of medications, such as those used in the treatment of cancer. The damage can occur to small secondary nerves, or to large primary ones, such as those in the spinal cord and brain. This damage may be temporary or permanent and it may lead to impairments that can range from temporary numbness to permanent paralysis and/or brain death. Allergic Reactions: Any time a substance or material comes in contact with our body, there is the possibility of an allergic reaction. These can range from a mild skin rash (contact dermatitis) to a severe systemic reaction (anaphylactic reaction), which can result in death. Death: In general, any medical intervention can result in death, most of the time due to an unforeseen complication. ____________________________________________________________________________________________

## 2021-02-27 NOTE — Progress Notes (Signed)
PROVIDER NOTE: Information contained herein reflects review and annotations entered in association with encounter. Interpretation of such information and data should be left to medically-trained personnel. Information provided to patient can be located elsewhere in the medical record under "Patient Instructions". Document created using STT-dictation technology, any transcriptional errors that may result from process are unintentional.    Patient: [name]  Service Category: E/M  Provider: Gaspar Cola, MD  DOB: 08/02/1936  DOS: 02/27/2021  Specialty: Interventional Pain Management  MRN: [id number]  Setting: Ambulatory outpatient  PCP: [name], NP  Type: Established Patient    Referring Provider: [name], NP  Location: Office  Delivery: Face-to-face     HPI  [name], a 85 y.o. year old female, is here today because of her Chronic bilateral low back pain without sciatica [M54.50, G89.29]. [name] primary complain today is Back Pain (Low and left) Last encounter: My last encounter with her was on 02/13/2021. Pertinent problems: 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 Pain Assessment: Severity of Chronic pain is reported as a 7 /10. Location: Back Left, Lower/denies but says her legs are becomming weaker. Onset: More than a month ago. Quality: Aching, Constant. Timing: Constant. Modifying factor(s): rest, medications, topicals, heat. Vitals:  height is 5' (1.524 m) and weight is 127 lb (57.6 kg). Her temporal temperature is 97 F (36.1 C) (abnormal). Her blood pressure is 147/66 (abnormal) and her pulse is 70. Her respiration is 18 and oxygen saturation is 95%.   Reason for encounter: post-procedure assessment.  Today I had a very long conversation with this patient along with her husband.  We went over the results of this last procedure as well as the first 1.  At this point, she refers that her primary area of pain is that of the left lower back which typically increases with activity.  Her second area pain is that of the lower extremities which she indicates is mostly weakness.  She also refers having some discomfort and pain over the area of the right knee.  In the case of the right lower extremity some of the discomfort goes down to the ankle and in the left he has a similar distribution, but neither 1 go the foot (2ry area of Pain) she refers to the lower extremity discomfort started approximately 1 week ago, and before then she was not really having any pain in that area.  She now specifies that she has had some right hip pain after her surgery and the primary pain that she indicated having in her right groin is now gone.  Today I made it clear that the first thing that we are trying to attempt is to identify where the different pains are coming from and for that it is the local anesthetic portion that we are most interested  in.  As expected, she confessed that she has been looking at the long-term benefit and essentially getting relief of both the low back and the leg pain.  As it turns out,  when we divided and we explained to the patient what each 1 of these procedures were meant to help then we have that after the bilateral L3 TFESI and left L4-5 LESI, she refers having 100% ongoing relief of her lower extremity pain, 100% ongoing relief of the right groin area, and 50% relief of the low back pain.  In the case of the diagnostic lumbar facet block, she initially indicated having pain on the left lower back, but on the day of the procedure she seemed to be having pain bilaterally.  After that procedure the pain in the right lower back went away and did not come back.  In the case of the left lower back pain she refers that this did not improve.  Today I made a copy of her lumbar MRI and we went over it in great detail.  After careful consideration, we have decided to schedule her to return for a left T12 and L1 TFESI + a left L4-5 lumbar facet block to determine if this particular combination is responsible for the symptom combination that she has been complaining of.  Post-Procedure Evaluation  Procedure (02/13/2021): Therapeutic bilateral L3 TFESI #1 + left L4-5 LESI #1 under fluoroscopic guidance and IV sedation Pre-procedure pain level: 6/10 Post-procedure: 0/10 (100% relief)  Anxiolysis: Moderate conscious sedation.  Effectiveness during initial hour after procedure (Ultra-Short Term Relief): 50 %.  Local anesthetic used: Long-acting (4-6 hours) Effectiveness: Defined as any analgesic benefit obtained secondary to the administration of local anesthetics. This carries significant diagnostic value as to the etiological location, or anatomical origin, of the pain. Duration of benefit is expected to coincide with the duration of the local anesthetic used.  Effectiveness during initial 4-6 hours after procedure (Short-Term Relief): 50 %.  Long-term benefit: Defined as any relief past the pharmacologic duration of the local anesthetics.  Effectiveness past the initial 6 hours after  procedure (Long-Term Relief): 0 %.  Benefits, current: Defined as benefit present at the time of this evaluation.   Analgesia:  Back to baseline  Pharmacotherapy Assessment  Analgesic: No chronic opioid analgesics therapy prescribed by our practice. Hydrocodone/APAP 7.5/325, 1 tab p.o. 4 times daily (30 mg/day of hydrocodone) (filled on 02/07/2021) (22.5 MME) (prescribed by Danise Mina, ANP.) MME/day: 30 mg/day   Monitoring: [place] PMP: PDMP reviewed during this encounter.       Pharmacotherapy: No side-effects or adverse reactions reported. Compliance: No problems identified. Effectiveness: Clinically acceptable.  No notes on file  UDS:  Summary  Date Value Ref Range Status  01/16/2021 Note  Final    Comment:    ==================================================================== Compliance Drug Analysis, Ur ==================================================================== Test                             Result       Flag       Units  Drug Present and Declared for Prescription Verification   Hydrocodone                    1275         EXPECTED   ng/mg creat   Dihydrocodeine                 287  EXPECTED   ng/mg creat   Norhydrocodone                 >3448        EXPECTED   ng/mg creat    Sources of hydrocodone include scheduled prescription medications.    Dihydrocodeine and norhydrocodone are expected metabolites of    hydrocodone. Dihydrocodeine is also available as a scheduled    prescription medication.    Citalopram                     PRESENT      EXPECTED   Desmethylcitalopram            PRESENT      EXPECTED    Desmethylcitalopram is an expected metabolite of citalopram or the    enantiomeric form, escitalopram.    Acetaminophen                  PRESENT      EXPECTED  Drug Present not Declared for Prescription Verification   Salicylate                     PRESENT      UNEXPECTED  Drug Absent but Declared for Prescription Verification    Diclofenac                     Not Detected UNEXPECTED    Diclofenac, as indicated in the declared medication list, is not    always detected even when used as directed.  ==================================================================== Test                      Result    Flag   Units      Ref Range   Creatinine              145              mg/dL      >=20 ==================================================================== Declared Medications:  The flagging and interpretation on this report are based on the  following declared medications.  Unexpected results may arise from  inaccuracies in the declared medications.   **Note: The testing scope of this panel includes these medications:   Citalopram (Celexa)  Hydrocodone (Norco)   **Note: The testing scope of this panel does not include small to  moderate amounts of these reported medications:   Acetaminophen (Norco)  Diclofenac (Voltaren)   **Note: The testing scope of this panel does not include the  following reported medications:   Cholecalciferol  Cyanocobalamin  Denosumab (Prolia)  Helium  Oxygen  Pirfenidone (Esbriet)  Sulfasalazine ==================================================================== For clinical consultation, please call [phone]. ====================================================================      ROS  Constitutional: Denies any fever or chills Gastrointestinal: No reported hemesis, hematochezia, vomiting, or acute GI distress Musculoskeletal: Denies any acute onset joint swelling, redness, loss of ROM, or weakness Neurological: No reported episodes of acute onset apraxia, aphasia, dysarthria, agnosia, amnesia, paralysis, loss of coordination, or loss of consciousness  Medication Review  HYDROcodone-acetaminophen, Oxygen-Helium, Pirfenidone, Vitamin D3, citalopram, denosumab, diclofenac Sodium, sulfaSALAzine, and vitamin B-12  History Review  Allergy: [name] has No Known  Allergies. Drug: [name]  reports no history of drug use. Alcohol:  reports no history of alcohol use. Tobacco:  reports that she has never smoked. She has never used smokeless tobacco. Social: Ms. Kangas  reports that she has never smoked. She has never used smokeless tobacco. She reports that  she does not drink alcohol and does not use drugs. 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 Surgical: [name]  has a past surgical history that includes Rotator cuff repair (Bilateral); Total shoulder replacement (Left); Cataract extraction w/PHACO (Left, 04/24/2016); Cataract extraction w/PHACO (Right, 07/14/2017); Esophagogastroduodenoscopy (egd) with propofol (N/A, 10/13/2018); Flexible sigmoidoscopy (N/A, 10/13/2018); Cholecystectomy; Tonsillectomy; Back surgery; Flexible sigmoidoscopy (N/A, 09/09/2019); Flexible sigmoidoscopy (N/A, 08/04/2020); and Hip Arthroplasty (Right, 09/27/2020). Family: family history includes COPD in her mother; Cancer in her brother.  Laboratory Chemistry Profile   Renal Lab Results  Component Value Date   BUN 22 10/24/2020   CREATININE 0.74 10/24/2020   BCR 23 09/21/2020   GFR 75.35 10/24/2020   GFRAA 76 09/21/2020   GFRNONAA >60 09/29/2020    Hepatic Lab Results  Component Value Date   AST 31 09/26/2020   ALT 18 09/26/2020   ALBUMIN 3.6 09/26/2020   ALKPHOS 58 09/26/2020     Electrolytes Lab Results  Component Value Date   NA 140 10/24/2020   K 4.6 10/24/2020   CL 102 10/24/2020   CALCIUM 10.0 10/24/2020   MG 2.1 09/29/2020   PHOS 3.3 05/28/2020    Bone Lab Results  Component Value Date   VD25OH 37.79 10/24/2020   25OHVITD1 42 01/03/2021   25OHVITD2 <1.0 01/03/2021   25OHVITD3 42 01/03/2021    Inflammation (CRP: Acute Phase) (ESR: Chronic Phase) Lab Results  Component Value Date   CRP <1 01/03/2021   ESRSEDRATE 13 01/03/2021          Note: Above Lab results reviewed.  Recent Imaging Review  DG PAIN CLINIC C-ARM 1-60 MIN NO REPORT Fluoro was used, but no Radiologist interpretation will be provided.  Please refer to "NOTES" tab for provider progress note. Note: Reviewed        Physical Exam  General appearance: Well nourished, well developed, and well hydrated. In no apparent acute distress Mental status: Alert, oriented x 3 (person, place, & time)       Respiratory: No evidence of acute respiratory distress Eyes: PERLA Vitals: BP (!) 147/66 (BP Location: Right Arm, Patient Position: Sitting, Cuff Size: Normal)   Pulse 70   Temp (!) 97 F (36.1 C) (Temporal)   Resp 18   Ht 5' (1.524 m)   Wt 127 lb (57.6 kg)   SpO2 95%   BMI 24.80 kg/m  BMI: Estimated body mass index is 24.8 kg/m as calculated from the following:   Height as of this encounter: 5' (1.524 m).   Weight as of this encounter: 127 lb (57.6 kg). Ideal: Ideal body weight: 45.5 kg (100 lb 4.9 oz) Adjusted ideal body weight: 50.3 kg (110 lb 15.8 oz)  Assessment   Status Diagnosis  Controlled Controlled Controlled 1. Chronic low back pain (1ry area of Pain) (Bilateral) (L>R) w/o sciatica   2. T12 compression fracture, sequela   3. Compression fracture of L1 lumbar vertebra, sequela   4. Compression fracture of L3 lumbar vertebra, sequela   5. Compression fracture of L4 lumbar vertebra, sequela   6. Chronic radicular pain of lower back  7. Lumbar spondylosis   8.  Lumbar facet syndrome      Updated Problems: No problems updated.  Plan of Care  Problem-specific:  No problem-specific Assessment & Plan notes found for this encounter.  [name] has a current medication list which includes the following long-term medication(s): citalopram and sulfasalazine.  Pharmacotherapy (Medications Ordered): No orders of the defined types were placed in this encounter.  Orders:  Orders Placed This Encounter  Procedures   Lumbar Transforaminal Epidural    Standing Status:   Future    Standing Expiration Date:   03/29/2021    Scheduling Instructions:     Side: Left-sided     Level: T12 & L1     Sedation: With Sedation.     Timeframe: ASAA    Order Specific Question:   Where will this procedure be performed?    Answer:   [facility]   LUMBAR FACET(MEDIAL BRANCH NERVE BLOCK) MBNB    Standing Status:   Future    Standing Expiration Date:   03/29/2021    Scheduling Instructions:     Procedure: Lumbar facet block (AKA.: Lumbosacral medial branch nerve block)     Side: Left-sided     Level: L5-S1 Facet (L4, L5, & S1 Medial Branch Nerves)     Sedation: With Sedation.     Timeframe: ASAA    Order Specific Question:   Where will this procedure be performed?    Answer:   [facility]    Follow-up plan:   Return for Procedure (w/ sedation): (L) T12 & L1 TFESI #1 + (L) L5-S1 L-FCT BLK #1.     Interventional Therapies  Risk  Complexity Considerations:   Estimated body mass index is 24.8 kg/m as calculated from the following:   Height as of this encounter: 5' (1.524 m).   Weight as of this encounter: 127 lb (57.6 kg). WNL   Planned  Pending:   Pending further evaluation   Under consideration:   Diagnostic right L2 and L3 transforaminal ESI #1  Diagnostic left lumbar facet block #1  Diagnostic left L4-5 LESI #1  Diagnostic left suprascapular NB #1  Possible left suprascapular nerve RFA #1  Diagnostic right IA  glenohumeral and AC joint injection #1    Completed:   Diagnostic bilateral lumbar facet block x1 (01/18/2021) (5-0) (0/0/0/0)  Diagnostic/therapeutic bilateral L3 TFESI x1 (02/13/2021)  Diagnostic/therapeutic left L4-5 LESI x1 (02/13/2021)    Therapeutic  Palliative (PRN) options:   None established    Recent Visits Date Type Provider Dept  02/13/21 Procedure visit Milinda Pointer, MD [facility]  02/08/21 Office Visit Milinda Pointer, MD [facility]  01/18/21 Procedure visit Milinda Pointer, MD [facility]  01/16/21 Office Visit Milinda Pointer, MD [facility]  01/03/21 Office Visit Milinda Pointer, MD [facility]  Showing recent visits within past 90 days and meeting all other requirements Today's Visits Date Type Provider Dept  02/27/21 Office Visit Milinda Pointer, MD [facility]  Showing today's visits and meeting all other requirements Future Appointments Date Type Provider Dept  03/20/21 Appointment Milinda Pointer, MD [facility]  Showing future appointments within next 90 days and meeting all other requirements I discussed the assessment and treatment plan with the patient. The patient was provided an opportunity to ask questions and all were answered. The patient agreed with the plan and demonstrated an understanding of the instructions.  Patient advised to call back or seek an  in-person evaluation if the symptoms or condition worsens.  Duration of encounter: 45 minutes.  Note by: Gaspar Cola, MD Date: 02/27/2021; Time: 7:39 PM

## 2021-03-02 ENCOUNTER — Other Ambulatory Visit: Payer: Self-pay | Admitting: Primary Care

## 2021-03-02 DIAGNOSIS — E7800 Pure hypercholesterolemia, unspecified: Secondary | ICD-10-CM

## 2021-03-02 MED ORDER — ATORVASTATIN CALCIUM 20 MG PO TABS: 20.0000 mg | ORAL_TABLET | Freq: Every day | ORAL | 3 refills | Status: DC

## 2021-03-07 ENCOUNTER — Ambulatory Visit: Payer: Medicare Other | Admitting: Pain Medicine

## 2021-03-14 DIAGNOSIS — G894 Chronic pain syndrome: Secondary | ICD-10-CM | POA: Diagnosis not present

## 2021-03-14 DIAGNOSIS — M25551 Pain in right hip: Secondary | ICD-10-CM | POA: Diagnosis not present

## 2021-03-14 DIAGNOSIS — M5416 Radiculopathy, lumbar region: Secondary | ICD-10-CM | POA: Diagnosis not present

## 2021-03-14 DIAGNOSIS — M25519 Pain in unspecified shoulder: Secondary | ICD-10-CM | POA: Diagnosis not present

## 2021-03-14 DIAGNOSIS — Z79899 Other long term (current) drug therapy: Secondary | ICD-10-CM | POA: Diagnosis not present

## 2021-03-20 ENCOUNTER — Ambulatory Visit (HOSPITAL_BASED_OUTPATIENT_CLINIC_OR_DEPARTMENT_OTHER): Payer: Medicare Other | Admitting: Pain Medicine

## 2021-03-20 ENCOUNTER — Other Ambulatory Visit: Payer: Self-pay

## 2021-03-20 ENCOUNTER — Ambulatory Visit
Admission: RE | Admit: 2021-03-20 | Discharge: 2021-03-20 | Disposition: A | Discharge status: Home / Self Care | Payer: Medicare Other | Source: Ambulatory Visit | Attending: Pain Medicine | Admitting: Pain Medicine

## 2021-03-20 VITALS — BP 126/74 | HR 72 | Temp 97.1°F | Resp 15 | Ht 60.0 in | Wt 130.0 lb

## 2021-03-20 DIAGNOSIS — S22080S Wedge compression fracture of T11-T12 vertebra, sequela: Secondary | ICD-10-CM | POA: Insufficient documentation

## 2021-03-20 DIAGNOSIS — S32030S Wedge compression fracture of third lumbar vertebra, sequela: Secondary | ICD-10-CM

## 2021-03-20 DIAGNOSIS — R937 Abnormal findings on diagnostic imaging of other parts of musculoskeletal system: Secondary | ICD-10-CM

## 2021-03-20 DIAGNOSIS — G8929 Other chronic pain: Secondary | ICD-10-CM

## 2021-03-20 DIAGNOSIS — S32040S Wedge compression fracture of fourth lumbar vertebra, sequela: Secondary | ICD-10-CM | POA: Insufficient documentation

## 2021-03-20 DIAGNOSIS — M545 Low back pain, unspecified: Secondary | ICD-10-CM | POA: Insufficient documentation

## 2021-03-20 DIAGNOSIS — M5416 Radiculopathy, lumbar region: Secondary | ICD-10-CM | POA: Insufficient documentation

## 2021-03-20 DIAGNOSIS — S32010S Wedge compression fracture of first lumbar vertebra, sequela: Secondary | ICD-10-CM | POA: Insufficient documentation

## 2021-03-20 DIAGNOSIS — M47816 Spondylosis without myelopathy or radiculopathy, lumbar region: Secondary | ICD-10-CM | POA: Diagnosis not present

## 2021-03-20 DIAGNOSIS — M47817 Spondylosis without myelopathy or radiculopathy, lumbosacral region: Secondary | ICD-10-CM | POA: Insufficient documentation

## 2021-03-20 DIAGNOSIS — M5137 Other intervertebral disc degeneration, lumbosacral region: Secondary | ICD-10-CM | POA: Insufficient documentation

## 2021-03-20 MED ORDER — MIDAZOLAM HCL 5 MG/5ML IJ SOLN
1.0000 mg | INTRAMUSCULAR | Status: DC | PRN
  Administered 2021-03-20: 2 mg via INTRAVENOUS
  Filled 2021-03-20: qty 5

## 2021-03-20 MED ORDER — LACTATED RINGERS IV SOLN
1000.0000 mL | Freq: Once | INTRAVENOUS | Status: AC
  Administered 2021-03-20: 1000 mL via INTRAVENOUS

## 2021-03-20 MED ORDER — IOHEXOL 180 MG/ML  SOLN
10.0000 mL | Freq: Once | INTRAMUSCULAR | Status: AC
  Administered 2021-03-20: 10 mL via INTRATHECAL

## 2021-03-20 MED ORDER — DEXAMETHASONE SODIUM PHOSPHATE 10 MG/ML IJ SOLN
20.0000 mg | Freq: Once | INTRAMUSCULAR | Status: AC
  Administered 2021-03-20: 20 mg
  Filled 2021-03-20: qty 2

## 2021-03-20 MED ORDER — TRIAMCINOLONE ACETONIDE 40 MG/ML IJ SUSP
40.0000 mg | Freq: Once | INTRAMUSCULAR | Status: AC
  Administered 2021-03-20: 40 mg
  Filled 2021-03-20: qty 1

## 2021-03-20 MED ORDER — ROPIVACAINE HCL 2 MG/ML IJ SOLN
2.0000 mL | Freq: Once | INTRAMUSCULAR | Status: AC
Start: 2021-03-20 — End: 2021-03-20
  Administered 2021-03-20: 2 mL via EPIDURAL

## 2021-03-20 MED ORDER — LIDOCAINE HCL 2 % IJ SOLN
20.0000 mL | Freq: Once | INTRAMUSCULAR | Status: AC
  Administered 2021-03-20: 200 mg
  Filled 2021-03-20: qty 20

## 2021-03-20 MED ORDER — ROPIVACAINE HCL 2 MG/ML IJ SOLN
5.0000 mL | Freq: Once | INTRAMUSCULAR | Status: AC
  Administered 2021-03-20: 5 mL via PERINEURAL

## 2021-03-20 MED ORDER — SODIUM CHLORIDE 0.9% FLUSH
2.0000 mL | Freq: Once | INTRAVENOUS | Status: AC
  Administered 2021-03-20: 2 mL

## 2021-03-20 NOTE — Progress Notes (Signed)
Safety precautions to be maintained throughout the outpatient stay will include: orient to surroundings, keep bed in low position, maintain call bell within reach at all times, provide assistance with transfer out of bed and ambulation.  

## 2021-03-20 NOTE — Progress Notes (Signed)
PROVIDER NOTE: Information contained herein reflects review and annotations entered in association with encounter. Interpretation of such information and data should be left to medically-trained personnel. Information provided to patient can be located elsewhere in the medical record under "Patient Instructions". Document created using STT-dictation technology, any transcriptional errors that may result from process are unintentional.    Patient: [name]  Service Category: Procedure  Provider: Oswaldo Done, MD  DOB: 1936/05/10  DOS: 03/20/2021  Location: [facility]  MRN: [id number]  Setting: Ambulatory - outpatient  Referring Provider: Doreene Nest, NP  Type: Established Patient  Specialty: Interventional Pain Management  PCP: Doreene Nest, NP   Primary Reason for Visit: Interventional Pain Management Treatment. CC: Back Pain (left)  Procedure #1:  Anesthesia, Analgesia, Anxiolysis:  Type: Trans-Foraminal Epidural Steroid Injection          Purpose: Diagnostic/Therapeutic Region: Posterolateral Lumbosacral Target Area: The 6 o'clock position under the pedicle, on the affected side. Approach: Posterior Percutaneous Paravertebral approach. Level:  T12 & L1  Level Laterality: Left       Type: Minimal (Conscious) Anxiolysis combined with Local Anesthesia Indication(s): Analgesia and Anxiety Route: Intravenous (IV) IV Access: Secured Sedation: Meaningful verbal contact was maintained at all times during the procedure  Local Anesthetic: Lidocaine 1-2%  Position: Prone   Indications: 1. Chronic low back pain (1ry area of Pain) (Bilateral) (L>R) w/o sciatica   2. Chronic radicular pain of lower back   3. DDD (degenerative disc disease), lumbosacral   4. T12 compression fracture, sequela   5. Compression fracture of L1 lumbar vertebra, sequela   6. Compression fracture of L3 lumbar vertebra, sequela   7. Compression fracture of L4 lumbar vertebra,  sequela   8. Abnormal MRI, lumbar spine (12/06/2020)    Procedure #2:    Type: Lumbar Facet, Medial Branch Block(s)          Primary Purpose: Diagnostic Region: Posterolateral Lumbosacral Spine Level: L4, L5, & S1 Medial Branch Level(s). Injecting these levels blocks the L5-S1 lumbar facet joints. Laterality: Left     Indications: 1. Lumbar facet syndrome   2. Lumbosacral facet arthropathy (Multilevel) (Bilateral)   3. Chronic low back pain (1ry area of Pain) (Bilateral) (L>R) w/o sciatica   4. DDD (degenerative disc disease), lumbosacral   5. Abnormal MRI, lumbar spine (12/06/2020)    Pain Score: Pre-procedure: 6 /10 Post-procedure: 0-No pain/10   Pre-op H&P Assessment:  [name] is a 85 y.o. (year old), female patient, seen today for interventional treatment. She  has a past surgical history that includes Rotator cuff repair (Bilateral); Total shoulder replacement (Left); Cataract extraction w/PHACO (Left, 04/24/2016); Cataract extraction w/PHACO (Right, 07/14/2017); Esophagogastroduodenoscopy (egd) with propofol (N/A, 10/13/2018); Flexible sigmoidoscopy (N/A, 10/13/2018); Cholecystectomy; Tonsillectomy; Back surgery; Flexible sigmoidoscopy (N/A, 09/09/2019); Flexible sigmoidoscopy (N/A, 08/04/2020); and Hip Arthroplasty (Right, 09/27/2020). [name] has a current medication list which includes the following prescription(s): atorvastatin, vitamin d3, citalopram, denosumab, esbriet, hydrocodone-acetaminophen, oxygen-helium, sulfasalazine, vitamin b-12, and voltaren, and the following Facility-Administered Medications: midazolam. Her primarily concern today is the Back Pain (left)  Initial Vital Signs:  Pulse/HCG Rate: 72ECG Heart Rate: 65 Temp: (!) 97.2 F (36.2 C) Resp: 16 BP: (!) 153/72 SpO2: 97 %  BMI: Estimated body mass index is 25.39 kg/m as calculated from the following:   Height as of this encounter: 5' (1.524 m).   Weight as of this encounter: 130 lb (59 kg).  Risk  Assessment: Allergies: Reviewed. She has No Known Allergies.  Allergy Precautions: None required Coagulopathies: Reviewed. None identified.  Blood-thinner therapy: None at this time Active Infection(s): Reviewed. None identified. [name] is afebrile  Site Confirmation: [name] was asked to confirm the procedure and laterality before marking the site Procedure checklist: Completed Consent: Before the procedure and under the influence of no sedative(s), amnesic(s), or anxiolytics, the patient was informed of the treatment options, risks and possible complications. To fulfill our ethical and legal obligations, as recommended by the American Medical Association's Code of Ethics, I have informed the patient of my clinical impression; the nature and purpose of the treatment or procedure; the risks, benefits, and possible complications of the intervention; the alternatives, including doing nothing; the risk(s) and benefit(s) of the alternative treatment(s) or procedure(s); and the risk(s) and benefit(s) of doing nothing. The patient was provided information about the general risks and possible complications associated with the procedure. These may include, but are not limited to: failure to achieve desired goals, infection, bleeding, organ or nerve damage, allergic reactions, paralysis, and death. In addition, the patient was informed of those risks and complications associated to Spine-related procedures, such as failure to decrease pain; infection (i.e.: Meningitis, epidural or intraspinal abscess); bleeding (i.e.: epidural hematoma, subarachnoid hemorrhage, or any other type of intraspinal or peri-dural bleeding); organ or nerve damage (i.e.: Any type of peripheral nerve, nerve root, or spinal cord injury) with subsequent damage to sensory, motor, and/or autonomic systems, resulting in permanent pain, numbness, and/or weakness of one or several areas of the body; allergic reactions; (i.e.:  anaphylactic reaction); and/or death. Furthermore, the patient was informed of those risks and complications associated with the medications. These include, but are not limited to: allergic reactions (i.e.: anaphylactic or anaphylactoid reaction(s)); adrenal axis suppression; blood sugar elevation that in diabetics may result in ketoacidosis or comma; water retention that in patients with history of congestive heart failure may result in shortness of breath, pulmonary edema, and decompensation with resultant heart failure; weight gain; swelling or edema; medication-induced neural toxicity; particulate matter embolism and blood vessel occlusion with resultant organ, and/or nervous system infarction; and/or aseptic necrosis of one or more joints. Finally, the patient was informed that Medicine is not an exact science; therefore, there is also the possibility of unforeseen or unpredictable risks and/or possible complications that may result in a catastrophic outcome. The patient indicated having understood very clearly. We have given the patient no guarantees and we have made no promises. Enough time was given to the patient to ask questions, all of which were answered to the patient's satisfaction. Ms. Cooke has indicated that she wanted to continue with the procedure. Attestation: I, the ordering provider, attest that I have discussed with the patient the benefits, risks, side-effects, alternatives, likelihood of achieving goals, and potential problems during recovery for the procedure that I have provided informed consent. Date  Time: 03/20/2021  9:27 AM  Pre-Procedure Preparation:  Monitoring: As per clinic protocol. Respiration, ETCO2, SpO2, BP, heart rate and rhythm monitor placed and checked for adequate function Safety Precautions: Patient was assessed for positional comfort and pressure points before starting the procedure. Time-out: I initiated and conducted the "Time-out" before starting the  procedure, as per protocol. The patient was asked to participate by confirming the accuracy of the "Time Out" information. Verification of the correct person, site, and procedure were performed and confirmed by me, the nursing staff, and the patient. "Time-out" conducted as per Joint Commission's Universal Protocol (UP.01.01.01). Time: 1026  Description of Procedure #1:  Area  Prepped: Entire Posterior Lumbosacral Area DuraPrep (Iodine Povacrylex [0.7% available iodine] and Isopropyl Alcohol, 74% w/w) Safety Precautions: Aspiration looking for blood return was conducted prior to all injections. At no point did we inject any substances, as a needle was being advanced. No attempts were made at seeking any paresthesias. Safe injection practices and needle disposal techniques used. Medications properly checked for expiration dates. SDV (single dose vial) medications used. Description of the Procedure: Protocol guidelines were followed. The patient was placed in position over the procedure table. The target area was identified and the area prepped in the usual manner. Skin & deeper tissues infiltrated with local anesthetic. Appropriate amount of time allowed to pass for local anesthetics to take effect. The procedure needles were then advanced to the target area. Proper needle placement secured. Negative aspiration confirmed. Solution injected in intermittent fashion, asking for systemic symptoms every 0.5cc of injectate. The needles were then removed and the area cleansed, making sure to leave some of the prepping solution back to take advantage of its long term bactericidal properties.  Vitals:   03/20/21 1040 03/20/21 1050 03/20/21 1100 03/20/21 1112  BP: 137/70 (!) 150/62 123/78 126/74  Pulse:      Resp: 16 15 15 15   Temp:  (!) 97.1 F (36.2 C)  (!) 97.1 F (36.2 C)  SpO2: 96% 95% 95% 95%  Weight:      Height:       Start Time: 1026 hrs.  Materials:  Needle(s) Type: Spinal Needle Gauge:  22G Length: 3.5-in Medication(s): Please see orders for medications and dosing details.  Imaging Guidance (Spinal) for procedure #1:  Type of Imaging Technique: Fluoroscopy Guidance (Spinal) Indication(s): Assistance in needle guidance and placement for procedures requiring needle placement in or near specific anatomical locations not easily accessible without such assistance. Exposure Time: Please see nurses notes. Contrast: Before injecting any contrast, we confirmed that the patient did not have an allergy to iodine, shellfish, or radiological contrast. Once satisfactory needle placement was completed at the desired level, radiological contrast was injected. Contrast injected under live fluoroscopy. No contrast complications. See chart for type and volume of contrast used. Fluoroscopic Guidance: I was personally present during the use of fluoroscopy. "Tunnel Vision Technique" used to obtain the best possible view of the target area. Parallax error corrected before commencing the procedure. "Direction-depth-direction" technique used to introduce the needle under continuous pulsed fluoroscopy. Once target was reached, antero-posterior, oblique, and lateral fluoroscopic projection used confirm needle placement in all planes. Images permanently stored in EMR. Interpretation: I personally interpreted the imaging intraoperatively. Adequate needle placement confirmed in multiple planes. Appropriate spread of contrast into desired area was observed. No evidence of afferent or efferent intravascular uptake. No intrathecal or subarachnoid spread observed. Permanent images saved into the patient's record. Pain Score: Pre-procedure: 6 /10 Post-procedure: 0-No pain/10   Description of Procedure #2:  Laterality: Left Levels:  L4, L5, & S1 Medial Branch Level(s) Area Prepped: Posterior Lumbosacral Region DuraPrep (Iodine Povacrylex [0.7% available iodine] and Isopropyl Alcohol, 74% w/w) Safety Precautions:  Aspiration looking for blood return was conducted prior to all injections. At no point did we inject any substances, as a needle was being advanced. Before injecting, the patient was told to immediately notify me if she was experiencing any new onset of "ringing in the ears, or metallic taste in the mouth". No attempts were made at seeking any paresthesias. Safe injection practices and needle disposal techniques used. Medications properly checked for expiration dates. SDV (single dose  vial) medications used. After the completion of the procedure, all disposable equipment used was discarded in the proper designated medical waste containers. Local Anesthesia: Protocol guidelines were followed. The patient was positioned over the fluoroscopy table. The area was prepped in the usual manner. The time-out was completed. The target area was identified using fluoroscopy. A 12-in long, straight, sterile hemostat was used with fluoroscopic guidance to locate the targets for each level blocked. Once located, the skin was marked with an approved surgical skin marker. Once all sites were marked, the skin (epidermis, dermis, and hypodermis), as well as deeper tissues (fat, connective tissue and muscle) were infiltrated with a small amount of a short-acting local anesthetic, loaded on a 10cc syringe with a 25G, 1.5-in  Needle. An appropriate amount of time was allowed for local anesthetics to take effect before proceeding to the next step. Local Anesthetic: Lidocaine 2.0% The unused portion of the local anesthetic was discarded in the proper designated containers. Technical explanation of process:  L4 Medial Branch Nerve Block (MBB): The target area for the L4 medial branch is at the junction of the postero-lateral aspect of the superior articular process and the superior, posterior, and medial edge of the transverse process of L5. Under fluoroscopic guidance, a Quincke needle was inserted until contact was made with os over  the superior postero-lateral aspect of the pedicular shadow (target area). After negative aspiration for blood, 0.5 mL of the nerve block solution was injected without difficulty or complication. The needle was removed intact. L5 Medial Branch Nerve Block (MBB): The target area for the L5 medial branch is at the junction of the postero-lateral aspect of the superior articular process and the superior, posterior, and medial edge of the sacral ala. Under fluoroscopic guidance, a Quincke needle was inserted until contact was made with os over the superior postero-lateral aspect of the pedicular shadow (target area). After negative aspiration for blood, 0.5 mL of the nerve block solution was injected without difficulty or complication. The needle was removed intact. S1 Medial Branch Nerve Block (MBB): The target area for the S1 medial branch is at the posterior and inferior 6 o'clock position of the L5-S1 facet joint. Under fluoroscopic guidance, the Quincke needle inserted for the L5 MBB was redirected until contact was made with os over the inferior and postero aspect of the sacrum, at the 6 o' clock position under the L5-S1 facet joint (Target area). After negative aspiration for blood, 0.5 mL of the nerve block solution was injected without difficulty or complication. The needle was removed intact.  Nerve block solution: 0.2% PF-Ropivacaine + Triamcinolone (40 mg/mL) diluted to a final concentration of 4 mg of Triamcinolone/mL of Ropivacaine The unused portion of the solution was discarded in the proper designated containers. Procedural Needles: 22-gauge, 5-inch, Quincke needles used for all levels.  Once the entire procedure was completed, the treated area was cleaned, making sure to leave some of the prepping solution back to take advantage of its long term bactericidal properties.      Illustration of the posterior view of the lumbar spine and the posterior neural structures. Laminae of L2 through S1  are labeled. DPRL5, dorsal primary ramus of L5; DPRS1, dorsal primary ramus of S1; DPR3, dorsal primary ramus of L3; FJ, facet (zygapophyseal) joint L3-L4; I, inferior articular process of L4; LB1, lateral branch of dorsal primary ramus of L1; IAB, inferior articular branches from L3 medial branch (supplies L4-L5 facet joint); IBP, intermediate branch plexus; MB3, medial branch of dorsal  primary ramus of L3; NR3, third lumbar nerve root; S, superior articular process of L5; SAB, superior articular branches from L4 (supplies L4-5 facet joint also); TP3, transverse process of L3.  Vitals:   03/20/21 1040 03/20/21 1050 03/20/21 1100 03/20/21 1112  BP: 137/70 (!) 150/62 123/78 126/74  Pulse:      Resp: 16 15 15 15   Temp:  (!) 97.1 F (36.2 C)  (!) 97.1 F (36.2 C)  SpO2: 96% 95% 95% 95%  Weight:      Height:       End Time: 1039 hrs.  Imaging Guidance (Spinal) for procedure #2:  Type of Imaging Technique: Fluoroscopy Guidance (Spinal) Indication(s): Assistance in needle guidance and placement for procedures requiring needle placement in or near specific anatomical locations not easily accessible without such assistance. Exposure Time: Please see nurses notes. Contrast: None used. Fluoroscopic Guidance: I was personally present during the use of fluoroscopy. "Tunnel Vision Technique" used to obtain the best possible view of the target area. Parallax error corrected before commencing the procedure. "Direction-depth-direction" technique used to introduce the needle under continuous pulsed fluoroscopy. Once target was reached, antero-posterior, oblique, and lateral fluoroscopic projection used confirm needle placement in all planes. Images permanently stored in EMR. Interpretation: No contrast injected. I personally interpreted the imaging intraoperatively. Adequate needle placement confirmed in multiple planes. Permanent images saved into the patient's record.  Antibiotic Prophylaxis:    Anti-infectives (From admission, onward)    None      Indication(s): None identified  Post-operative Assessment:  Post-procedure Vital Signs:  Pulse/HCG Rate: 7264 Temp: (!) 97.1 F (36.2 C) Resp: 15 BP: 126/74 SpO2: 95 %  EBL: None  Complications: No immediate post-treatment complications observed by team, or reported by patient.  Note: The patient tolerated the entire procedure well. A repeat set of vitals were taken after the procedure and the patient was kept under observation following institutional policy, for this type of procedure. Post-procedural neurological assessment was performed, showing return to baseline, prior to discharge. The patient was provided with post-procedure discharge instructions, including a section on how to identify potential problems. Should any problems arise concerning this procedure, the patient was given instructions to immediately contact us, at any time, without hesitation. In any case, we plan to contact the patient by telephone for a follow-up status report regarding this interventional procedure.  Comments:  No additional relevant information.  Plan of Care  Orders:  Orders Placed This Encounter  Procedures   Lumbar Transforaminal Epidural    Scheduling Instructions:     Side: Left-sided     Level: T12, L1     Sedation: Patient's choice.     Timeframe: Today    Order Specific Question:   Where will this procedure be performed?    Answer:   [facility]   LUMBAR FACET(MEDIAL BRANCH NERVE BLOCK) MBNB    Scheduling Instructions:     Procedure: Lumbar facet block (AKA.: Lumbosacral medial branch nerve block)     Side: Left-sided     Level: L5-S1 Facets (L4, L5, & S1 Medial Branch Nerves)     Sedation: Patient's choice.     Timeframe: Today    Order Specific Question:   Where will this procedure be performed?    Answer:   [facility]   DG PAIN CLINIC C-ARM 1-60 MIN NO REPORT    Intraoperative interpretation by  procedural physician at [facility].    Standing Status:   Standing    Number  of Occurrences:   1    Order Specific Question:   Reason for exam:    Answer:   Assistance in needle guidance and placement for procedures requiring needle placement in or near specific anatomical locations not easily accessible without such assistance.   Informed Consent Details: Physician/Practitioner Attestation; Transcribe to consent form and obtain patient signature    Provider Attestation: I, Raistlin Gum A. Laban Emperor, MD, (Pain Management Specialist), the physician/practitioner, attest that I have discussed with the patient the benefits, risks, side effects, alternatives, likelihood of achieving goals and potential problems during recovery for the procedure that I have provided informed consent.    Scheduling Instructions:     Note: Always confirm laterality of pain with Ms. Janee Morn, before procedure.     Transcribe to consent form and obtain patient signature.    Order Specific Question:   Physician/Practitioner attestation of informed consent for procedure/surgical case    Answer:   I, the physician/practitioner, attest that I have discussed with the patient the benefits, risks, side effects, alternatives, likelihood of achieving goals and potential problems during recovery for the procedure that I have provided informed consent.    Order Specific Question:   Procedure    Answer:   Diagnostic lumbar transforaminal epidural steroid injection under fluoroscopic guidance. (See notes for level and laterality.)    Order Specific Question:   Physician/Practitioner performing the procedure    Answer:   Auston Halfmann A. Laban Emperor, MD    Order Specific Question:   Indication/Reason    Answer:   Lumbar radiculopathy/radiculitis associated with lumbar stenosis   Provide equipment / supplies at bedside    "Block Tray" (Disposable  single use) Needle type: SpinalSpinal Amount/quantity: 2 Size: Medium (5-inch) Gauge:  22G    Standing Status:   Standing    Number of Occurrences:   1    Order Specific Question:   Specify    Answer:   Block Tray   Informed Consent Details: Physician/Practitioner Attestation; Transcribe to consent form and obtain patient signature    Nursing Order: Transcribe to consent form and obtain patient signature. Note: Always confirm laterality of pain with Ms. Janee Morn, before procedure.    Order Specific Question:   Physician/Practitioner attestation of informed consent for procedure/surgical case    Answer:   I, the physician/practitioner, attest that I have discussed with the patient the benefits, risks, side effects, alternatives, likelihood of achieving goals and potential problems during recovery for the procedure that I have provided informed consent.    Order Specific Question:   Procedure    Answer:   Lumbar Facet Block  under fluoroscopic guidance    Order Specific Question:   Physician/Practitioner performing the procedure    Answer:   Tasha Jindra A. Laban Emperor MD    Order Specific Question:   Indication/Reason    Answer:   Low Back Pain, with our without leg pain, due to Facet Joint Arthralgia (Joint Pain) Spondylosis (Arthritis of the Spine), without myelopathy or radiculopathy (Nerve Damage).   Provide equipment / supplies at bedside    "Block Tray" (Disposable  single use) Needle type: SpinalSpinal Amount/quantity: 2 Size: Regular (3.5-inch) Gauge: 22G    Standing Status:   Standing    Number of Occurrences:   1    Order Specific Question:   Specify    Answer:   Block Tray    Chronic Opioid Analgesic:  No chronic opioid analgesics therapy prescribed by our practice. Hydrocodone/APAP 7.5/325, 1 tab p.o. 4 times daily (30 mg/day of  hydrocodone) (filled on 02/07/2021) (22.5 MME) (prescribed by Melany Guernsey, ANP.) MME/day: 30 mg/day   Medications ordered for procedure: Meds ordered this encounter  Medications   iohexol (OMNIPAQUE) 180 MG/ML injection 10 mL     Must be Myelogram-compatible. If not available, you may substitute with a water-soluble, non-ionic, hypoallergenic, myelogram-compatible radiological contrast medium.   lidocaine (XYLOCAINE) 2 % (with pres) injection 400 mg   sodium chloride flush (NS) 0.9 % injection 2 mL   ropivacaine (PF) 2 mg/mL (0.2%) (NAROPIN) injection 2 mL   dexamethasone (DECADRON) injection 20 mg   lactated ringers infusion 1,000 mL   midazolam (VERSED) 5 MG/5ML injection 1-2 mg    Make sure Flumazenil is available in the pyxis when using this medication. If oversedation occurs, administer 0.2 mg IV over 15 sec. If after 45 sec no response, administer 0.2 mg again over 1 min; may repeat at 1 min intervals; not to exceed 4 doses (1 mg)   triamcinolone acetonide (KENALOG-40) injection 40 mg   ropivacaine (PF) 2 mg/mL (0.2%) (NAROPIN) injection 5 mL    Medications administered: We administered iohexol, lidocaine, sodium chloride flush, ropivacaine (PF) 2 mg/mL (0.2%), dexamethasone, lactated ringers, midazolam, triamcinolone acetonide, and ropivacaine (PF) 2 mg/mL (0.2%).  See the medical record for exact dosing, route, and time of administration.  Follow-up plan:   Return in about 2 weeks (around 04/03/2021) for procedure day (afternoon F2F) (PPE).      Interventional Therapies  Risk  Complexity Considerations:   Estimated body mass index is 24.8 kg/m as calculated from the following:   Height as of this encounter: 5' (1.524 m).   Weight as of this encounter: 127 lb (57.6 kg). WNL   Planned  Pending:   Pending further evaluation   Under consideration:   Diagnostic right L2 and L3 transforaminal ESI #1  Diagnostic left lumbar facet block #1  Diagnostic left L4-5 LESI #1  Diagnostic left suprascapular NB #1  Possible left suprascapular nerve RFA #1  Diagnostic right IA glenohumeral and AC joint injection #1    Completed:   Diagnostic bilateral lumbar facet block x1 (01/18/2021) (5-0) (0/0/0/0)   Diagnostic/therapeutic bilateral L3 TFESI x1 (02/13/2021)  Diagnostic/therapeutic left L4-5 LESI x1 (02/13/2021)    Therapeutic  Palliative (PRN) options:   None established     Recent Visits Date Type Provider Dept  02/27/21 Office Visit Delano Metz, MD [facility]  02/13/21 Procedure visit Delano Metz, MD [facility]  02/08/21 Office Visit Delano Metz, MD [facility]  01/18/21 Procedure visit Delano Metz, MD [facility]  01/16/21 Office Visit Delano Metz, MD [facility]  01/03/21 Office Visit Delano Metz, MD [facility]  Showing recent visits within past 90 days and meeting all other requirements Today's Visits Date Type Provider Dept  03/20/21 Procedure visit Delano Metz, MD [facility]  Showing today's visits and meeting all other requirements Future Appointments Date Type Provider Dept  04/24/21 Appointment Delano Metz, MD [facility]  Showing future appointments within next 90 days and meeting all other requirements Disposition: Discharge home  Discharge (Date  Time): 03/20/2021; 1113 hrs.   Primary Care Physician: Doreene Nest, NP Location: [facility] Note by: Oswaldo Done, MD Date: 03/20/2021; Time: 12:30 PM  Disclaimer:  Medicine is not an Visual merchandiser. The only guarantee in medicine is that nothing is guaranteed. It is important to note that the decision to proceed with this intervention was  based on the information collected from the patient. The Data and conclusions were drawn from the patient's questionnaire, the interview, and the physical examination. Because the information was provided in large part by the patient, it cannot be guaranteed that it has not been purposely or unconsciously manipulated. Every effort has been made to obtain as much relevant data as possible for this evaluation. It  is important to note that the conclusions that lead to this procedure are derived in large part from the available data. Always take into account that the treatment will also be dependent on availability of resources and existing treatment guidelines, considered by other Pain Management Practitioners as being common knowledge and practice, at the time of the intervention. For Medico-Legal purposes, it is also important to point out that variation in procedural techniques and pharmacological choices are the acceptable norm. The indications, contraindications, technique, and results of the above procedure should only be interpreted and judged by a Board-Certified Interventional Pain Specialist with extensive familiarity and expertise in the same exact procedure and technique.

## 2021-03-20 NOTE — Patient Instructions (Signed)

## 2021-03-21 ENCOUNTER — Telehealth: Payer: Self-pay | Admitting: *Deleted

## 2021-03-21 NOTE — Telephone Encounter (Signed)
Spoke with patient post procedure.  No questions or concerns.

## 2021-04-17 DIAGNOSIS — R058 Other specified cough: Secondary | ICD-10-CM | POA: Diagnosis not present

## 2021-04-17 DIAGNOSIS — J84112 Idiopathic pulmonary fibrosis: Secondary | ICD-10-CM | POA: Diagnosis not present

## 2021-04-17 DIAGNOSIS — Z9981 Dependence on supplemental oxygen: Secondary | ICD-10-CM | POA: Diagnosis not present

## 2021-04-17 DIAGNOSIS — R0602 Shortness of breath: Secondary | ICD-10-CM | POA: Diagnosis not present

## 2021-04-20 DIAGNOSIS — M5416 Radiculopathy, lumbar region: Secondary | ICD-10-CM | POA: Diagnosis not present

## 2021-04-20 DIAGNOSIS — G894 Chronic pain syndrome: Secondary | ICD-10-CM | POA: Diagnosis not present

## 2021-04-20 DIAGNOSIS — M25551 Pain in right hip: Secondary | ICD-10-CM | POA: Diagnosis not present

## 2021-04-20 DIAGNOSIS — Z79899 Other long term (current) drug therapy: Secondary | ICD-10-CM | POA: Diagnosis not present

## 2021-04-20 DIAGNOSIS — M25519 Pain in unspecified shoulder: Secondary | ICD-10-CM | POA: Diagnosis not present

## 2021-04-21 ENCOUNTER — Other Ambulatory Visit: Payer: Self-pay

## 2021-04-21 ENCOUNTER — Encounter: Payer: Self-pay | Admitting: Emergency Medicine

## 2021-04-21 ENCOUNTER — Emergency Department: Payer: Medicare Other

## 2021-04-21 ENCOUNTER — Emergency Department
Admission: EM | Admit: 2021-04-21 | Discharge: 2021-04-21 | Disposition: A | Discharge status: Home / Self Care | Payer: Medicare Other | Attending: Emergency Medicine | Admitting: Emergency Medicine

## 2021-04-21 DIAGNOSIS — Z85828 Personal history of other malignant neoplasm of skin: Secondary | ICD-10-CM | POA: Insufficient documentation

## 2021-04-21 DIAGNOSIS — Z8616 Personal history of COVID-19: Secondary | ICD-10-CM | POA: Diagnosis not present

## 2021-04-21 DIAGNOSIS — R109 Unspecified abdominal pain: Secondary | ICD-10-CM | POA: Diagnosis present

## 2021-04-21 DIAGNOSIS — N200 Calculus of kidney: Secondary | ICD-10-CM | POA: Diagnosis not present

## 2021-04-21 DIAGNOSIS — M4186 Other forms of scoliosis, lumbar region: Secondary | ICD-10-CM | POA: Diagnosis not present

## 2021-04-21 DIAGNOSIS — N132 Hydronephrosis with renal and ureteral calculous obstruction: Secondary | ICD-10-CM | POA: Diagnosis not present

## 2021-04-21 DIAGNOSIS — Z96641 Presence of right artificial hip joint: Secondary | ICD-10-CM | POA: Insufficient documentation

## 2021-04-21 DIAGNOSIS — Z96611 Presence of right artificial shoulder joint: Secondary | ICD-10-CM | POA: Diagnosis not present

## 2021-04-21 DIAGNOSIS — I10 Essential (primary) hypertension: Secondary | ICD-10-CM | POA: Insufficient documentation

## 2021-04-21 DIAGNOSIS — K5730 Diverticulosis of large intestine without perforation or abscess without bleeding: Secondary | ICD-10-CM | POA: Diagnosis not present

## 2021-04-21 LAB — CBC WITH DIFFERENTIAL/PLATELET
Abs Immature Granulocytes: 0.08 10*3/uL — ABNORMAL HIGH (ref 0.00–0.07)
Basophils Absolute: 0.1 10*3/uL (ref 0.0–0.1)
Basophils Relative: 1 %
Eosinophils Absolute: 0.2 10*3/uL (ref 0.0–0.5)
Eosinophils Relative: 2 %
HCT: 41.3 % (ref 36.0–46.0)
Hemoglobin: 13.7 g/dL (ref 12.0–15.0)
Immature Granulocytes: 1 %
Lymphocytes Relative: 6 %
Lymphs Abs: 1 10*3/uL (ref 0.7–4.0)
MCH: 34.3 pg — ABNORMAL HIGH (ref 26.0–34.0)
MCHC: 33.2 g/dL (ref 30.0–36.0)
MCV: 103.5 fL — ABNORMAL HIGH (ref 80.0–100.0)
Monocytes Absolute: 1 10*3/uL (ref 0.1–1.0)
Monocytes Relative: 7 %
Neutro Abs: 13.4 10*3/uL — ABNORMAL HIGH (ref 1.7–7.7)
Neutrophils Relative %: 83 %
Platelets: 263 10*3/uL (ref 150–400)
RBC: 3.99 MIL/uL (ref 3.87–5.11)
RDW: 12.1 % (ref 11.5–15.5)
WBC: 15.8 10*3/uL — ABNORMAL HIGH (ref 4.0–10.5)
nRBC: 0 % (ref 0.0–0.2)

## 2021-04-21 LAB — COMPREHENSIVE METABOLIC PANEL
ALT: 25 U/L (ref 0–44)
AST: 36 U/L (ref 15–41)
Albumin: 3.8 g/dL (ref 3.5–5.0)
Alkaline Phosphatase: 69 U/L (ref 38–126)
Anion gap: 5 (ref 5–15)
BUN: 24 mg/dL — ABNORMAL HIGH (ref 8–23)
CO2: 28 mmol/L (ref 22–32)
Calcium: 9.2 mg/dL (ref 8.9–10.3)
Chloride: 106 mmol/L (ref 98–111)
Creatinine, Ser: 0.73 mg/dL (ref 0.44–1.00)
GFR, Estimated: >60 mL/min (ref >60)
Glucose, Bld: 126 mg/dL — ABNORMAL HIGH (ref 70–99)
Potassium: 4.4 mmol/L (ref 3.5–5.1)
Sodium: 139 mmol/L (ref 135–145)
Total Bilirubin: 0.6 mg/dL (ref 0.3–1.2)
Total Protein: 7 g/dL (ref 6.5–8.1)

## 2021-04-21 LAB — URINALYSIS, COMPLETE (UACMP) WITH MICROSCOPIC
Bacteria, UA: NONE SEEN
Bilirubin Urine: NEGATIVE
Glucose, UA: NEGATIVE mg/dL
Hgb urine dipstick: NEGATIVE
Ketones, ur: NEGATIVE mg/dL
Leukocytes,Ua: NEGATIVE
Nitrite: NEGATIVE
Protein, ur: 30 mg/dL — AB
Specific Gravity, Urine: 1.025 (ref 1.005–1.030)
pH: 5 (ref 5.0–8.0)

## 2021-04-21 LAB — LIPASE, BLOOD: Lipase: 35 U/L (ref 11–51)

## 2021-04-21 LAB — TROPONIN I (HIGH SENSITIVITY): Troponin I (High Sensitivity): 13 ng/L (ref <18)

## 2021-04-21 MED ORDER — ONDANSETRON 4 MG PO TBDP
4.0000 mg | ORAL_TABLET | Freq: Once | ORAL | Status: AC
  Administered 2021-04-21: 4 mg via ORAL
  Filled 2021-04-21: qty 1

## 2021-04-21 MED ORDER — ONDANSETRON 4 MG PO TBDP: 4.0000 mg | ORAL_TABLET | Freq: Three times a day (TID) | ORAL | 0 refills | Status: AC | PRN

## 2021-04-21 MED ORDER — KETOROLAC TROMETHAMINE 30 MG/ML IJ SOLN
15.0000 mg | Freq: Once | INTRAMUSCULAR | Status: AC
  Administered 2021-04-21: 15 mg via INTRAMUSCULAR
  Filled 2021-04-21: qty 1

## 2021-04-21 MED ORDER — TAMSULOSIN HCL 0.4 MG PO CAPS: 0.4000 mg | ORAL_CAPSULE | Freq: Every day | ORAL | 0 refills | Status: AC

## 2021-04-21 MED ORDER — HYDROCODONE-ACETAMINOPHEN 7.5-325 MG PO TABS
1.0000 | ORAL_TABLET | Freq: Four times a day (QID) | ORAL | Status: DC | PRN
  Administered 2021-04-21: 1 via ORAL
  Filled 2021-04-21: qty 1

## 2021-04-21 NOTE — ED Triage Notes (Signed)
Pt presents to the ER from home with complaints of left Flank pain, reports woke up int he middle of the night with pain and had 3 episodes of emesis, reports when she woke up her pain level was 10/10. Pt talks in complete sentences no respiratory distress noted

## 2021-04-21 NOTE — ED Notes (Signed)
Pt placed on 2L [place], hx of pulmonary fibroids

## 2021-04-21 NOTE — ED Provider Notes (Signed)
[facility] Emergency Department Provider Note  ____________________________________________   Event Date/Time   First MD Initiated Contact with Patient 04/21/21 [id number]     (approximate)  I have reviewed the triage vital signs    HISTORY  Chief Complaint Flank Pain    HPI Terri VIERRA is a 85 y.o. female who presents with flank pain.  Patient has a history of pulmonary fibrosis on 2l as needed, hypertension, hyperlipidemia and chronic back pain which she is on hydrocodone for who comes in with L flank pain.  Patient states that she woke up with sudden onset severe back pain in the left flank that was constant but would intermittently worsen, has not taken any of her normal back pain medicine, nothing makes it worse.  She denies any hematuria, dysuria, chest pain or shortness of breath.     Past Medical History:  Diagnosis Date   Altered mental state    Anxiety    BMI 30.0-30.9,adult    Cellulitis    Chest pain, atypical    Compression fracture of L4 lumbar vertebra    Constipation    Cystitis    Cystocele    Diverticulosis    Fatigue    Fibrosis, idiopathic pulmonary (HCC)    Gastroenteritis    History of back surgery    History of herniated intervertebral disc    HTN (hypertension)    Hypercholesteremia    Hypocalcemia    Incontinence of urine    OA (osteoarthritis)    shoulder and back   Obesity    Osteopenia    Pneumonia    Shortness of breath    Sleep apnea    Squamous cell carcinoma of skin 07/16/2016   Right upper lateral eyebrow. SCCis.   Squamous cell carcinoma of skin 01/13/2018   Left temporal hairline. WD SCC with superficial infiltration.   Thrush    Vitamin D deficiency     Patient Active Problem List   Diagnosis Date Noted   Chronic radicular pain of lower back 02/13/2021   Lumbar spondylosis 02/13/2021   Lumbosacral facet arthropathy (Multilevel) (Bilateral) 01/16/2021   Lumbar facet syndrome 01/16/2021    Spondylosis without myelopathy or radiculopathy, lumbosacral region 01/16/2021   DDD (degenerative disc disease), lumbosacral 01/16/2021   DDD (degenerative disc disease), thoracolumbar 01/16/2021   Chronic use of opiate for therapeutic purpose 01/16/2021   Abnormal MRI, lumbar spine (12/06/2020) 01/15/2021   T12 compression fracture, sequela 01/15/2021   Compression fracture of L1 lumbar vertebra, sequela 01/15/2021   Compression fracture of L3 lumbar vertebra, sequela 01/15/2021   Chronic pain syndrome 01/03/2021   Pharmacologic therapy 01/03/2021   Disorder of skeletal system 01/03/2021   Problems influencing health status 01/03/2021   Chronic groin pain (2ry area of Pain) (Right) 01/03/2021   Chronic low back pain (1ry area of Pain) (Bilateral) (L>R) w/o sciatica 01/03/2021   Chronic hip pain after total replacement (Right) 01/03/2021   Chronic shoulder pain (3ry area of Pain) (Bilateral) (L>R) 01/03/2021   Cough 12/20/2020   C. 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    Past Surgical History:  Procedure Laterality Date   BACK SURGERY     CATARACT EXTRACTION W/PHACO Left 04/24/2016   Procedure: CATARACT EXTRACTION PHACO AND INTRAOCULAR LENS PLACEMENT ([place]);  Surgeon: Leandrew Koyanagi, MD;  Location: [facility];  Service: Ophthalmology;  Laterality: Left;   CATARACT EXTRACTION W/PHACO Right 07/14/2017   Procedure: CATARACT  EXTRACTION PHACO AND INTRAOCULAR LENS PLACEMENT ([place])  RIGHT;  Surgeon: Leandrew Koyanagi, MD;  Location: [place];  Service: Ophthalmology;  Laterality: Right;   CHOLECYSTECTOMY     ESOPHAGOGASTRODUODENOSCOPY (EGD) WITH PROPOFOL N/A 10/13/2018   Procedure: ESOPHAGOGASTRODUODENOSCOPY (EGD) WITH PROPOFOL;  Surgeon: [name], MD;  [facility] END[facility]COPY;  Service: Gastroenterology;  Laterality: N/A;   FLEXIBLE SIGMOIDOSCOPY N/A 10/13/2018   Procedure: FLEXIBLE SIGMOIDOSCOPY;  Surgeon: [name], MD;  Location: [facility] ENDOSCOPY;  Service: Gastroenterology;  Laterality: N/A;   FLEXIBLE SIGMOIDOSCOPY N/A 09/09/2019   Procedure: FLEXIBLE SIGMOIDOSCOPY;  Surgeon: [name], MD;  Location: [facility] ENDOSCOPY;  Service: Gastroenterology;  Laterality: N/A;   FLEXIBLE SIGMOIDOSCOPY N/A 08/04/2020   Procedure: FLEXIBLE SIGMOIDOSCOPY;  Surgeon: [name], MD;  Location[facility] END[facility]COPY;  Service: Gastroenterology;  Laterality: N/A;  Per Dr. Vicente Males, unsedated   HIP ARTHROPLASTY Right 09/27/2020   Procedure: ARTHROPLASTY BIPOLAR HIP (HEMIARTHROPLASTY);  Surgeon: Hessie Knows, MD;  Location: [facility] ORS;  Service: Orthopedics;  Laterality: Right;   ROTATOR CUFF REPAIR Bilateral    left-12/2009; right-03/2009 dr.[email or web address]   TONSILLECTOMY     TOTAL SHOULDER REPLACEMENT Left     Prior to Admission medications   Medication Sig Start Date End Date Taking? Authorizing Provider  atorvastatin (LIPITOR) 20 MG tablet Take 1 tablet (20 mg total) by mouth daily. For cholesterol. 03/02/21   [name], NP  Cholecalciferol (VITAMIN D3) 125 MCG (5000 UT) CAPS Take 5,000 Int'l Units by mouth.    [provider]  citalopram (CELEXA) 40 MG tablet TAKE 1 TABLET (40 MG TOTAL) BY MOUTH DAILY. FOR ANXIETY. 09/11/20   [name], NP  denosumab (PROLIA) 60 MG/ML SOSY injection Inject 60 mg into the skin every 6 (six) months.    [provider]  ESBRIET 267 MG TABS Take 267 mg by mouth 3 (three)  times daily with meals. 02/25/17   [provider]  HYDROcodone-acetaminophen (NORCO) 7.5-325 MG tablet Take 1 tablet by mouth 3 (three) times daily as needed. 12/16/20   [provider]  OXYGEN Inhale into the lungs. 2 L at night    [provider]  sulfaSALAzine (AZULFIDINE) 500 MG tablet Take 1 tablet (500 mg total) by mouth 2 (two) times daily. 02/22/21 08/21/21  [name], MD  vitamin B-12 (CYANOCOBALAMIN) 1000 MCG tablet Take 1,000 mcg by mouth every other day.    [provider]  VOLTAREN 1 % GEL Apply 2 g topically in the morning, at noon, and at bedtime. Shoulders and back 12/23/17   [provider]    Allergies Patient has no known allergies.  Family History  Problem Relation Age of Onset   COPD Mother    Cancer Brother        colon    Social History Social History   Tobacco Use   Smoking status: Never   Smokeless tobacco: Never  Vaping Use   Vaping Use: Never used  Substance Use Topics   Alcohol use: No   Drug use: No      Review of Systems Constitutional:  No fever/chills Eyes: No visual changes. ENT: No sore throat. Cardiovascular: Denies chest pain. Respiratory: no SOB. No cough Gastrointestinal: No abdominal pain.  No nausea, no vomiting.  No diarrhea.  No constipation. Genitourinary: no severe blood in urine Musculoskeletal: + flank pain Skin: Negative for rash. Neurological: Negative for headaches, focal weakness or numbness. All other ROS negative ____________________________________________   PHYSICAL EXAM:  VITAL SIGNS: ED Triage Vitals  Enc Vitals Group     BP 04/21/21 0406 136/79     Pulse Rate 04/21/21 0406 60     Resp 04/21/21 0406 16     Temp 04/21/21 0406 98.4 F (36.9 C)     Temp src --      SpO2 04/21/21 0406 93 %     Weight 04/21/21 0407 130 lb (59 kg)     Height 04/21/21 0407 5' (1.524 m)     Head Circumference --      Peak Flow --      Pain Score 04/21/21 0413 8     Pain Loc --       Pain Edu? --      Excl. in [place]? --     Constitutional: Alert and oriented. Discomfort noted  Eyes: Conjunctivae are normal. EOMI. Head: Atraumatic. Nose: No congestion/rhinnorhea. Mouth/Throat: Mucous membranes are moist.   Neck: No stridor. Trachea Midline. FROM Cardiovascular: Normal rate, regular rhythm.  Good peripheral circulation. Respiratory: no audible stridor, work of breathing  Gastrointestinal: Soft and nontender. No distention.  Musculoskeletal: No lower extremity tenderness nor edema.  No joint effusions. + left flank pain  Neurologic:  Normal speech and language. No gross focal neurologic deficits are appreciated.  Skin:  Skin is warm, dry and intact. No rash noted. Psychiatric: Mood and affect are normal. Speech and behavior are normal. GU: Deferred  Flank tenderness.  ____________________________________________   LABS (all labs ordered are listed, but only abnormal results are displayed)  Labs Reviewed  COMPREHENSIVE METABOLIC PANEL - Abnormal; Notable for the following components:      Result Value   Glucose, Bld 126 (*)    BUN 24 (*)    All other components within normal limits  CBC WITH DIFFERENTIAL/PLATELET - Abnormal; Notable for the following components:   WBC 15.8 (*)    MCV 103.5 (*)    MCH 34.3 (*)    Neutro Abs 13.4 (*)    Abs Immature Granulocytes 0.08 (*)    All other components within normal limits  LIPASE, BLOOD  URINALYSIS, COMPLETE (UACMP) WITH MICROSCOPIC  TROPONIN I (HIGH SENSITIVITY)   ____________________________________________   ED ECG REPORT I, Vanessa [place], the attending physician, personally viewed and interpreted this ECG.  Normal sinus rate of 63, no ST elevation, no T wave inversions except for lead III, normal intervals ____________________________________________  RADIOLOGY  Official radiology report(s): CT Renal Stone Study  Result Date: 04/21/2021 CLINICAL DATA:  Flank pain with kidney stone suspected. Pain is  on the left and associated with vomiting EXAM: CT ABDOMEN AND PELVIS WITHOUT CONTRAST TECHNIQUE: Multidetector CT imaging of the abdomen and pelvis was performed following the standard protocol without IV contrast. COMPARISON:  Head CT 07/23/2012 FINDINGS: Lower chest: Pulmonary fibrosis with honeycombing and traction bronchiectasis. No acute finding. Coronary atherosclerosis. Hepatobiliary: No focal liver abnormality.Absent gallbladder. Pancreas: Unremarkable. Spleen: Unremarkable. Adrenals/Urinary Tract: Negative adrenals. Left hydroureteronephrosis secondary to a distal ureteral calculus just above the UVJ which measures 7 x 2 mm on reformats. Punctate renal calculi numbering 2 on the right and  single on the left. Unremarkable bladder. Stomach/Bowel: No obstruction. Probable appendectomy. Left colonic diverticulosis. Vascular/Lymphatic: No acute vascular abnormality. Scattered atheromatous calcifications. No mass or adenopathy. Reproductive:Unremarkable for age Other: No ascites or pneumoperitoneum. Musculoskeletal: No acute abnormalities. Advanced lumbar spine degeneration with scoliosis. Remote T12, L1, L3, and L4 compression fractures prior cement augmentation sparing the L3 level. Right hip arthroplasty which is unremarkable. Severe foraminal narrowing on the right at L3-4. IMPRESSION: 1. Left hydroureteronephrosis from a 7 x 2 mm distal ureteral calculus. 2. Punctate bilateral renal calculi. 3. Pulmonary fibrosis. Electronically Signed   By: Monte Fantasia M.D.   On: 04/21/2021 05:33    ____________________________________________   PROCEDURES  Procedure(s) performed (including Critical Care):  .1-3 Lead EKG Interpretation  Date/Time: 04/21/2021 9:10 AM Performed by: Vanessa [place], MD Authorized by: Vanessa [place], MD     Interpretation: normal     ECG rate:  60s   ECG rate assessment: normal     Rhythm: sinus rhythm     Ectopy: none     Conduction: normal      ____________________________________________   INITIAL IMPRESSION / ASSESSMENT AND PLAN / ED COURSE  DINISHA CAI was evaluated in Emergency Department on 04/21/2021 for the symptoms described in the history of present illness. She was evaluated in the context of the global COVID-19 pandemic, which necessitated consideration that the patient might be at risk for infection with the SARS-CoV-2 virus that causes COVID-19. Institutional protocols and algorithms that pertain to the evaluation of patients at risk for COVID-19 are in a state of rapid change based on information released by regulatory bodies including the CDC and federal and state organizations. These policies and algorithms were followed during the patient's care in the ED.     Pt presents with flank pain.  Suspect this is most likely kidney stone.  EKG and cardiac markers were ordered in triage but patient has no chest pain.  Will get labs to evaluate for electrolyte abnormalities/AKI.  CT scan ordered to evaluate for kidney stone.  Also consider appendicitis vs SBO although less likely given description of pain.  Will get UA to evaluate for UTI/pyelo.   CT confirms kidney stone.  No evidence of infected stone. No fevers.  Recheck x2 UA re-assuring.   Reviewed labs--No severe AKI.   Discussed with Dr. Erlene Quan from urology who can follow patient up early next week.  Patient's pain is well controlled with home hydrocodone and a dose of Toradol.  Patient tolerating p.o. after some Zofran.    Given zofran/flomax (given precautions regarding hypotension) and urology number for follow up.  Pt expressed understanding and felt comfortable with dc home.   I discussed the provisional nature of ED diagnosis, the treatment so far, the ongoing plan of care, follow up appointments and return precautions with the patient and any family or support people present. They expressed understanding and agreed with the plan, discharged  home.     ____________________________________________   FINAL CLINICAL IMPRESSION(S) / ED DIAGNOSES   Final diagnoses:  Kidney stone      MEDICATIONS GIVEN DURING THIS VISIT:  Medications  HYDROcodone-acetaminophen (NORCO) 7.5-325 MG per tablet 1 tablet (1 tablet Oral Given 04/21/21 0817)  ondansetron (ZOFRAN-ODT) disintegrating tablet 4 mg (4 mg Oral Given 04/21/21 0817)  ketorolac (TORADOL) 30 MG/ML injection 15 mg (15 mg Intramuscular Given 04/21/21 0817)     ED Discharge Orders          Ordered    ondansetron [facility]  ODT) 4 MG disintegrating tablet  Every 8 hours PRN        04/21/21 0913    tamsulosin (FLOMAX) 0.4 MG CAPS capsule  Daily        04/21/21 0913             Note:  This document was prepared using Dragon voice recognition software and may include unintentional dictation errors.   Vanessa [place], MD 04/21/21 [phone]

## 2021-04-21 NOTE — Discharge Instructions (Addendum)
You have a kidney stone. See report below.   Take ibuprofen 432m every 8 hours daily (as long as you are not on any other blood thinners or have kidney disease)  Take your home pain medications. Do not drive, work, or operate machinery while on this.  Take zofran to help with nausea. Take Flomax to help dilate uretha.This can cause some lightheadness so if you feel like that stop taking it.   Call urology number above to schedule outpatient appointment. Return to ED for fevers, unable to keep food down, or any other concerns.      IMPRESSION:  1. Left hydroureteronephrosis from a 7 x 2 mm distal ureteral  calculus.  2. Punctate bilateral renal calculi.  3. Pulmonary fibrosis.

## 2021-04-23 NOTE — Progress Notes (Signed)
PROVIDER NOTE: Information contained herein reflects review and annotations entered in association with encounter. Interpretation of such information and data should be left to medically-trained personnel. Information provided to patient can be located elsewhere in the medical record under "Patient Instructions". Document created using STT-dictation technology, any transcriptional errors that may result from process are unintentional.    Patient: [name]  Service Category: E/M  Provider: Oswaldo Done, MD  DOB: 12-Nov-1935  DOS: 04/24/2021  Specialty: Interventional Pain Management  MRN: [id number]  Setting: Ambulatory outpatient  PCP: Doreene Nest, NP  Type: Established Patient    Referring Provider: Doreene Nest, NP  Location: Office  Delivery: Face-to-face     HPI  [name], a 85 y.o. year old female, is here today because of her Chronic bilateral low back pain without sciatica [M54.50, G89.29]. Ms. Hunkele primary complain today is Back Pain Last encounter: My last encounter with her was on 03/20/2021. Pertinent problems: Ms. Carrington has Arthritis; Compression fracture of L4 lumbar vertebra, sequela; Osteopetrosis; Subcapital fracture of femur, sequela (Right); Chronic pain syndrome; Chronic groin pain (2ry area of Pain) (Right); Chronic low back pain (1ry area of Pain) (Bilateral) (L>R) w/o sciatica; Chronic hip pain after total replacement (Right); Chronic shoulder pain (3ry area of Pain) (Bilateral) (L>R); Abnormal MRI, lumbar spine (12/06/2020); T12 compression fracture, sequela; Compression fracture of L1 lumbar vertebra, sequela; Compression fracture of L3 lumbar vertebra, sequela; Lumbosacral facet arthropathy (Multilevel) (Bilateral); Lumbar facet syndrome; Spondylosis without myelopathy or radiculopathy, lumbosacral region; DDD (degenerative disc disease), lumbosacral; DDD (degenerative disc disease), thoracolumbar; Chronic radicular pain of lower back;  Lumbar spondylosis; Osteoporosis with current pathological fracture, sequela; Osteoporosis with pathological fracture of lumbar vertebra (HCC); Trigger point of shoulder region (Left); Tendinitis of triceps (Left); Chronic upper back pain; Chronic thoracic back pain (Left); Trigger point with back pain; Lower extremity weakness (Bilateral); Myofascial pain syndrome of lumbar spine; Myofascial pain syndrome of thoracic spine; and Cervicalgia on their pertinent problem list. Pain Assessment: Severity of Chronic pain is reported as a 8 /10. Location: Back  /Pain radiaties up to her shoulder, arm and neck. Onset: More than a month ago. Quality: Constant, Throbbing. Timing: Constant. Modifying factor(s): meds, rest, heat. Vitals:  height is 5' (1.524 m) and weight is 130 lb (59 kg). Her temporal temperature is 96.8 F (36 C) (abnormal). Her blood pressure is 139/75 and her pulse is 74. Her respiration is 16 and oxygen saturation is 93%.   Reason for encounter: post-procedure assessment.  According to the patient she attained a 100% relief of the pain for the duration of the local anesthetic which once it wore off, said that the pain relief.  Physical exam today demonstrated trigger point tenderness over the left triceps muscle tendon and the paravertebral muscles within the cervical thoracic and thoracolumbar regions.  The patient has a history of multiple thoracic and lumbar fractures and today we will be updating her cervical, thoracic, and lumbar x-rays to further evaluate her condition.  I will also be referring her to physical therapy to work with the myofascial pain that she seems to be experiencing on the upper and lower back.  I will also be scheduling her to come back for trigger point injections of the paravertebral muscles and left shoulder region.  She indicates currently experience significant lower extremity weakness and therefore I will be referring her for nerve conduction testing of the lower  extremities as well.  Post-Procedure Evaluation  Procedure (03/20/2021):  Procedure #  1:   Anesthesia, Analgesia, Anxiolysis:  Type: Trans-Foraminal Epidural Steroid Injection          Purpose: Diagnostic/Therapeutic Region: Posterolateral Lumbosacral Target Area: The 6 o'clock position under the pedicle, on the affected side. Approach: Posterior Percutaneous Paravertebral approach. Level:  T12 & L1  Level Laterality: Left        Type: Minimal (Conscious) Anxiolysis combined with Local Anesthesia Indication(s): Analgesia and Anxiety Route: Intravenous (IV) IV Access: Secured Sedation: Meaningful verbal contact was maintained at all times during the procedure  Local Anesthetic: Lidocaine 1-2%   Position: Prone   Procedure #2:      Type: Lumbar Facet, Medial Branch Block(s)          Primary Purpose: Diagnostic Region: Posterolateral Lumbosacral Spine Level: L4, L5, & S1 Medial Branch Level(s). Injecting these levels blocks the L5-S1 lumbar facet joints. Laterality: Left     Pre-procedure pain level: 6/10 Post-procedure: 0/10 (100% relief)  Anxiolysis: Anxiolysis provided  Effectiveness during initial hour after procedure (Ultra-Short Term Relief): 100 %.  Local anesthetic used: Long-acting (4-6 hours) Effectiveness: Defined as any analgesic benefit obtained secondary to the administration of local anesthetics. This carries significant diagnostic value as to the etiological location, or anatomical origin, of the pain. Duration of benefit is expected to coincide with the duration of the local anesthetic used.  Effectiveness during initial 4-6 hours after procedure (Short-Term Relief): 100 %.  Long-term benefit: Defined as any relief past the pharmacologic duration of the local anesthetics.  Effectiveness past the initial 6 hours after procedure (Long-Term Relief): 0 %.  Benefits, current: Defined as benefit present at the time of this evaluation.   Analgesia:   The patient refers  having attained a 100% relief of the low back pain for the duration of the local anesthetic, unfortunately once that local anesthetic wore off her pain returned.  However, she seems to think that her low back pain is somewhat related to her mid and upper back pain.  However, this does not seem to be the case. Function: No benefit ROM: No benefit  Pharmacotherapy Assessment  Analgesic: No chronic opioid analgesics therapy prescribed by our practice. Hydrocodone/APAP 7.5/325, 1 tab p.o. 4 times daily (30 mg/day of hydrocodone) (filled on 02/07/2021) (22.5 MME) (prescribed by Melany Guernsey, ANP.) MME/day: 30 mg/day   Monitoring: [place] PMP: PDMP reviewed during this encounter.       Pharmacotherapy: No side-effects or adverse reactions reported. Compliance: No problems identified. Effectiveness: Clinically acceptable.  Brigitte Pulse, RN  04/24/2021  2:50 PM  Sign when Signing Visit Safety precautions to be maintained throughout the outpatient stay will include: orient to surroundings, keep bed in low position, maintain call bell within reach at all times, provide assistance with transfer out of bed and ambulation.      UDS:  Summary  Date Value Ref Range Status  01/16/2021 Note  Final    Comment:    ==================================================================== Compliance Drug Analysis, Ur ==================================================================== Test                             Result       Flag       Units  Drug Present and Declared for Prescription Verification   Hydrocodone                    1275         EXPECTED   ng/mg creat   Dihydrocodeine  287          EXPECTED   ng/mg creat   Norhydrocodone                 >3448        EXPECTED   ng/mg creat    Sources of hydrocodone include scheduled prescription medications.    Dihydrocodeine and norhydrocodone are expected metabolites of    hydrocodone. Dihydrocodeine is also available as a  scheduled    prescription medication.    Citalopram                     PRESENT      EXPECTED   Desmethylcitalopram            PRESENT      EXPECTED    Desmethylcitalopram is an expected metabolite of citalopram or the    enantiomeric form, escitalopram.    Acetaminophen                  PRESENT      EXPECTED  Drug Present not Declared for Prescription Verification   Salicylate                     PRESENT      UNEXPECTED  Drug Absent but Declared for Prescription Verification   Diclofenac                     Not Detected UNEXPECTED    Diclofenac, as indicated in the declared medication list, is not    always detected even when used as directed.  ==================================================================== Test                      Result    Flag   Units      Ref Range   Creatinine              145              mg/dL      >=62 ==================================================================== Declared Medications:  The flagging and interpretation on this report are based on the  following declared medications.  Unexpected results may arise from  inaccuracies in the declared medications.   **Note: The testing scope of this panel includes these medications:   Citalopram (Celexa)  Hydrocodone (Norco)   **Note: The testing scope of this panel does not include small to  moderate amounts of these reported medications:   Acetaminophen (Norco)  Diclofenac (Voltaren)   **Note: The testing scope of this panel does not include the  following reported medications:   Cholecalciferol  Cyanocobalamin  Denosumab (Prolia)  Helium  Oxygen  Pirfenidone (Esbriet)  Sulfasalazine ==================================================================== For clinical consultation, please call [phone]. ====================================================================      ROS  Constitutional: Denies any fever or chills Gastrointestinal: No reported hemesis, hematochezia,  vomiting, or acute GI distress Musculoskeletal: Denies any acute onset joint swelling, redness, loss of ROM, or weakness Neurological: No reported episodes of acute onset apraxia, aphasia, dysarthria, agnosia, amnesia, paralysis, loss of coordination, or loss of consciousness  Medication Review  HYDROcodone-acetaminophen, Magnesium Oxide, Oxygen-Helium, Pirfenidone, Vitamin D3, atorvastatin, calcium carbonate, calcium-vitamin D, citalopram, denosumab, diclofenac Sodium, ondansetron, sulfaSALAzine, tamsulosin, and vitamin B-12  History Review  Allergy: Ms. Manocchio has No Known Allergies. Drug: Ms. Marruffo  reports no history of drug use. Alcohol:  reports no history of alcohol use. Tobacco:  reports that she has never smoked. She has never used smokeless tobacco.  Social: Ms. Rohena  reports that she has never smoked. She has never used smokeless tobacco. She reports that she does not drink alcohol and does not use drugs. Medical:  has a past medical history of Altered mental state, Anxiety, BMI 30.0-30.9,adult, Cellulitis, Chest pain, atypical, Compression fracture of L4 lumbar vertebra, Constipation, Cystitis, Cystocele, Diverticulosis, Fatigue, Fibrosis, idiopathic pulmonary (HCC), Gastroenteritis, History of back surgery, History of herniated intervertebral disc, HTN (hypertension), Hypercholesteremia, Hypocalcemia, Incontinence of urine, OA (osteoarthritis), Obesity, Osteopenia, Pneumonia, Shortness of breath, Sleep apnea, Squamous cell carcinoma of skin (07/16/2016), Squamous cell carcinoma of skin (01/13/2018), Thrush, and Vitamin D deficiency. Surgical: Ms. Overmiller  has a past surgical history that includes Rotator cuff repair (Bilateral); Total shoulder replacement (Left); Cataract extraction w/PHACO (Left, 04/24/2016); Cataract extraction w/PHACO (Right, 07/14/2017); Esophagogastroduodenoscopy (egd) with propofol (N/A, 10/13/2018); Flexible sigmoidoscopy (N/A, 10/13/2018); Cholecystectomy;  Tonsillectomy; Back surgery; Flexible sigmoidoscopy (N/A, 09/09/2019); Flexible sigmoidoscopy (N/A, 08/04/2020); and Hip Arthroplasty (Right, 09/27/2020). Family: family history includes COPD in her mother; Cancer in her brother.  Laboratory Chemistry Profile   Renal Lab Results  Component Value Date   BUN 24 (H) 04/21/2021   CREATININE 0.73 04/21/2021   BCR 23 09/21/2020   GFR 75.35 10/24/2020   GFRAA 76 09/21/2020   GFRNONAA >60 04/21/2021    Hepatic Lab Results  Component Value Date   AST 36 04/21/2021   ALT 25 04/21/2021   ALBUMIN 3.8 04/21/2021   ALKPHOS 69 04/21/2021   LIPASE 35 04/21/2021    Electrolytes Lab Results  Component Value Date   NA 139 04/21/2021   K 4.4 04/21/2021   CL 106 04/21/2021   CALCIUM 9.2 04/21/2021   MG 2.1 09/29/2020   PHOS 3.3 05/28/2020    Bone Lab Results  Component Value Date   VD25OH 37.79 10/24/2020   25OHVITD1 42 01/03/2021   25OHVITD2 <1.0 01/03/2021   25OHVITD3 42 01/03/2021    Inflammation (CRP: Acute Phase) (ESR: Chronic Phase) Lab Results  Component Value Date   CRP <1 01/03/2021   ESRSEDRATE 13 01/03/2021         Note: Above Lab results reviewed.  Recent Imaging Review  CT Renal Stone Study CLINICAL DATA:  Flank pain with kidney stone suspected. Pain is on the left and associated with vomiting  EXAM: CT ABDOMEN AND PELVIS WITHOUT CONTRAST  TECHNIQUE: Multidetector CT imaging of the abdomen and pelvis was performed following the standard protocol without IV contrast.  COMPARISON:  Head CT 07/23/2012  FINDINGS: Lower chest: Pulmonary fibrosis with honeycombing and traction bronchiectasis. No acute finding. Coronary atherosclerosis.  Hepatobiliary: No focal liver abnormality.Absent gallbladder.  Pancreas: Unremarkable.  Spleen: Unremarkable.  Adrenals/Urinary Tract: Negative adrenals. Left hydroureteronephrosis secondary to a distal ureteral calculus just above the UVJ which measures 7 x 2 mm on reformats.  Punctate renal calculi numbering 2 on the right and single on the left. Unremarkable bladder.  Stomach/Bowel: No obstruction. Probable appendectomy. Left colonic diverticulosis.  Vascular/Lymphatic: No acute vascular abnormality. Scattered atheromatous calcifications. No mass or adenopathy.  Reproductive:Unremarkable for age  Other: No ascites or pneumoperitoneum.  Musculoskeletal: No acute abnormalities. Advanced lumbar spine degeneration with scoliosis. Remote T12, L1, L3, and L4 compression fractures prior cement augmentation sparing the L3 level. Right hip arthroplasty which is unremarkable. Severe foraminal narrowing on the right at L3-4.  IMPRESSION: 1. Left hydroureteronephrosis from a 7 x 2 mm distal ureteral calculus. 2. Punctate bilateral renal calculi. 3. Pulmonary fibrosis.  Electronically Signed   By: Marnee Spring M.D.   On:  04/21/2021 05:33 Note: Reviewed        Physical Exam  General appearance: Well nourished, well developed, and well hydrated. In no apparent acute distress Mental status: Alert, oriented x 3 (person, place, & time)       Respiratory: No evidence of acute respiratory distress Eyes: PERLA Vitals: BP 139/75 (BP Location: Right Arm, Patient Position: Sitting, Cuff Size: Normal)   Pulse 74   Temp (!) 96.8 F (36 C) (Temporal)   Resp 16   Ht 5' (1.524 m)   Wt 130 lb (59 kg)   SpO2 93%   BMI 25.39 kg/m  BMI: Estimated body mass index is 25.39 kg/m as calculated from the following:   Height as of this encounter: 5' (1.524 m).   Weight as of this encounter: 130 lb (59 kg). Ideal: Ideal body weight: 45.5 kg (100 lb 4.9 oz) Adjusted ideal body weight: 50.9 kg (112 lb 3 oz)  Assessment   Status Diagnosis  Controlled Controlled Controlled 1. Chronic low back pain (1ry area of Pain) (Bilateral) (L>R) w/o sciatica   2. T12 compression fracture, sequela   3. Compression fracture of L1 lumbar vertebra, sequela   4. Compression fracture  of L3 lumbar vertebra, sequela   5. Compression fracture of L4 lumbar vertebra, sequela   6. Chronic radicular pain of lower back   7. Lumbar facet syndrome   8. Chronic pain syndrome   9. Osteoporosis with current pathological fracture, sequela   10. Osteoporosis with pathological fracture of lumbar vertebra (HCC)   11. Trigger point of shoulder region (Left)   12. Tendinitis of triceps (Left)   13. Chronic upper back pain   14. Chronic thoracic back pain (Left)   15. Trigger point with back pain   16. Weakness of both lower extremities   17. Myofascial pain syndrome of lumbar spine   18. Myofascial pain syndrome of thoracic spine   19. Cervicalgia      Updated Problems: Problem  Osteoporosis With Current Pathological Fracture, Sequela  Osteoporosis With Pathological Fracture of Lumbar Vertebra (Hcc)  Trigger point of shoulder region (Left)   Triceps muscle tendon   Tendinitis of triceps (Left)  Chronic Upper Back Pain  Chronic thoracic back pain (Left)  Trigger Point With Back Pain  Lower extremity weakness (Bilateral)  Myofascial Pain Syndrome of Lumbar Spine  Myofascial Pain Syndrome of Thoracic Spine  Cervicalgia    Plan of Care  Problem-specific:  No problem-specific Assessment & Plan notes found for this encounter.  Ms. GRACYNN WONDERLY has a current medication list which includes the following long-term medication(s): atorvastatin, calcium carbonate, citalopram, and sulfasalazine.  Pharmacotherapy (Medications Ordered): Meds ordered this encounter  Medications   calcium carbonate (OSCAL) 1500 (600 Ca) MG TABS tablet    Sig: Take 1 tablet (1,500 mg total) by mouth 2 (two) times daily with a meal.    Dispense:  60 tablet    Refill:  2    Fill one day early if pharmacy is closed on scheduled refill date. May substitute for generic, or similar, if available.   Magnesium Oxide 500 MG CAPS    Sig: Take 1 capsule (500 mg total) by mouth 2 (two) times daily at 8 am  and 10 pm.    Dispense:  180 capsule    Refill:  0    Fill one day early if pharmacy is closed on scheduled refill date. May substitute for generic if available.   Cholecalciferol (VITAMIN D3) 125 MCG (5000  UT) CAPS    Sig: Take 1 capsule (5,000 Units total) by mouth daily with breakfast. Take along with calcium and magnesium.    Dispense:  30 capsule    Refill:  2    Fill 1 day early if pharmacy is closed on scheduled refill date. Generic permitted. Do not send renewal requests.    Orders:  Orders Placed This Encounter  Procedures   TRIGGER POINT INJECTION    Standing Status:   Future    Standing Expiration Date:   07/25/2021    Scheduling Instructions:     Area: Upper Back and left shoulder     Side: Left     Sedation: No Sedation.     Timeframe: ASAA    Order Specific Question:   Where will this procedure be performed?    Answer:   [facility]   DG Cervical Spine With Flex & Extend    Patient presents with axial pain with possible radicular component.  Please evaluate for any evidence of cervical spine instability. Describe the presence of any spondylolisthesis (Antero- or retrolisthesis). If present, provide displacement "Grade" and measurement in cm. Please describe presence and specific location (Level & Laterality) of any signs of  osteoarthritis, zygapophyseal (Facet) joints DJD (including decreased joint space and/or osteophytosis), DDD, Foraminal narrowing, as well as any sclerosis and/or cyst formation. Please comment on ROM. Patient presents with axial pain with possible radicular component. Please assist [place] in identifying specific level(s) and laterality of any additional findings such as: 1. Facet (Zygapophyseal) joint DJD (Hypertrophy, space narrowing, subchondral sclerosis, and/or osteophyte formation) 2. DDD and/or IVDD (Loss of disc height, desiccation, gas patterns, osteophytes, endplate sclerosis, or "Black disc disease") 3. Pars defects 4.  Spondylolisthesis, spondylosis, and/or spondyloarthropathies (include Degree/Grade of displacement in mm) (stability) 5. Vertebral body Fractures (acute/chronic) (state percentage of collapse) 6. Demineralization (osteopenia/osteoporotic) 7. Bone pathology 8. Foraminal narrowing  9. Surgical changes    Standing Status:   Future    Number of Occurrences:   1    Standing Expiration Date:   05/25/2021    Scheduling Instructions:     Imaging must be done as soon as possible. Inform patient that order will expire within 30 days and I will not renew it.    Order Specific Question:   Reason for Exam (SYMPTOM  OR DIAGNOSIS REQUIRED)    Answer:   Cervicalgia    Order Specific Question:   Preferred imaging location?    Answer:   [facility]    Order Specific Question:   Call Results- Best Contact Number?    Answer:   [phone] [facility])    Order Specific Question:   Radiology Contrast Protocol - do NOT remove file path    Answer:   [email or web address]    Order Specific Question:   Release to patient    Answer:   Immediate   DG Lumbar Spine Complete W/Bend    Patient presents with axial pain with possible radicular component. Please assist [place] in identifying specific level(s) and laterality of any additional findings such as: 1. Facet (Zygapophyseal) joint DJD (Hypertrophy, space narrowing, subchondral sclerosis, and/or osteophyte formation) 2. DDD and/or IVDD (Loss of disc height, desiccation, gas patterns, osteophytes, endplate sclerosis, or "Black disc disease") 3. Pars defects 4. Spondylolisthesis, spondylosis, and/or spondyloarthropathies (include Degree/Grade of displacement in mm) (stability) 5. Vertebral body Fractures (acute/chronic) (state percentage of collapse) 6. Demineralization (osteopenia/osteoporotic) 7. Bone pathology 8. Foraminal narrowing  9. Surgical changes  Standing Status:   Future    Number of Occurrences:   1     Standing Expiration Date:   05/25/2021    Scheduling Instructions:     Imaging must be done as soon as possible. Inform patient that order will expire within 30 days and I will not renew it.    Order Specific Question:   Reason for Exam (SYMPTOM  OR DIAGNOSIS REQUIRED)    Answer:   Low back pain    Order Specific Question:   Preferred imaging location?    Answer:   [facility]    Order Specific Question:   Call Results- Best Contact Number?    Answer:   [phone] [facility])    Order Specific Question:   Radiology Contrast Protocol - do NOT remove file path    Answer:   [email or web address]    Order Specific Question:   Release to patient    Answer:   Immediate   Ambulatory referral to Physical Therapy    Referral Priority:   Routine    Referral Type:   Physical Medicine    Referral Reason:   Specialty Services Required    Requested Specialty:   Physical Therapy    Number of Visits Requested:   1   Informed Consent Details: Physician/Practitioner Attestation; Transcribe to consent form and obtain patient signature    Provider Attestation: I, Jonita Hirota A. Laban Emperor, MD, (Pain Management Specialist), the physician/practitioner, attest that I have discussed with the patient the benefits, risks, side effects, alternatives, likelihood of achieving goals and potential problems during recovery for the procedure that I have provided informed consent.    Scheduling Instructions:     Note: Always confirm laterality of pain with Ms. Janee Morn, before procedure.     Transcribe to consent form and obtain patient signature.    Order Specific Question:   Physician/Practitioner attestation of informed consent for procedure/surgical case    Answer:   I, the physician/practitioner, attest that I have discussed with the patient the benefits, risks, side effects, alternatives, likelihood of achieving goals and potential problems during recovery for the  procedure that I have provided informed consent.    Order Specific Question:   Procedure    Answer:   Myoneural Block (Trigger Point injection)    Order Specific Question:   Physician/Practitioner performing the procedure    Answer:   Kateryn Marasigan A. Laban Emperor MD    Order Specific Question:   Indication/Reason    Answer:   Musculoskeletal pain/myofascial pain secondary to trigger point   NCV with EMG(electromyography)    Bilateral testing requested.    Standing Status:   Future    Standing Expiration Date:   06/24/2021    Scheduling Instructions:     Please refer this patient to [facility] for Nerve Conduction testing of the lower extremities. (EMG & PNCV)    Order Specific Question:   Where should this test be performed?    Answer:   other    Order Specific Question:   Release to patient    Answer:   Immediate    Follow-up plan:   Return for (Clinic) procedure: (L) Shoulder & Upper back MNB/TPI #1.     Interventional Therapies  Risk  Complexity Considerations:   Estimated body mass index is 24.8 kg/m as calculated from the following:   Height as of this encounter: 5' (1.524 m).   Weight as of this encounter: 127 lb (57.6 kg). WNL   Planned  Pending:  Pending further evaluation   Under consideration:   Diagnostic right L2 and L3 transforaminal ESI #1  Diagnostic left lumbar facet block #1  Diagnostic left L4-5 LESI #1  Diagnostic left suprascapular NB #1  Possible left suprascapular nerve RFA #1  Diagnostic right IA glenohumeral and AC joint injection #1    Completed:   Diagnostic bilateral lumbar facet block x1 (01/18/2021) (5-0) (0/0/0/0)  Diagnostic/therapeutic bilateral L3 TFESI x1 (02/13/2021)  Diagnostic/therapeutic left L4-5 LESI x1 (02/13/2021)    Therapeutic  Palliative (PRN) options:   None established      Recent Visits Date Type Provider Dept  03/20/21 Procedure visit Delano Metz, MD [facility]  02/27/21 Office Visit  Delano Metz, MD [facility]  02/13/21 Procedure visit Delano Metz, MD [facility]  02/08/21 Office Visit Delano Metz, MD [facility]  Showing recent visits within past 90 days and meeting all other requirements Today's Visits Date Type Provider Dept  04/24/21 Office Visit Delano Metz, MD [facility]  Showing today's visits and meeting all other requirements Future Appointments Date Type Provider Dept  05/17/21 Appointment Delano Metz, MD [facility]  Showing future appointments within next 90 days and meeting all other requirements I discussed the assessment and treatment plan with the patient. The patient was provided an opportunity to ask questions and all were answered. The patient agreed with the plan and demonstrated an understanding of the instructions.  Patient advised to call back or seek an in-person evaluation if the symptoms or condition worsens.  Duration of encounter: 35 minutes.  Note by: Oswaldo Done, MD Date: 04/24/2021; Time: 6:09 PM

## 2021-04-24 ENCOUNTER — Ambulatory Visit
Admission: RE | Admit: 2021-04-24 | Discharge: 2021-04-24 | Disposition: A | Discharge status: Home / Self Care | Payer: Medicare Other | Source: Ambulatory Visit | Attending: Pain Medicine | Admitting: Pain Medicine

## 2021-04-24 ENCOUNTER — Ambulatory Visit
Admission: RE | Admit: 2021-04-24 | Discharge: 2021-04-24 | Disposition: A | Discharge status: Home / Self Care | Payer: Medicare Other | Attending: Pain Medicine | Admitting: Pain Medicine

## 2021-04-24 ENCOUNTER — Ambulatory Visit: Payer: Medicare Other | Admitting: Pain Medicine

## 2021-04-24 ENCOUNTER — Encounter: Payer: Self-pay | Admitting: Pain Medicine

## 2021-04-24 ENCOUNTER — Other Ambulatory Visit: Payer: Self-pay | Admitting: Pain Medicine

## 2021-04-24 ENCOUNTER — Other Ambulatory Visit: Payer: Self-pay

## 2021-04-24 ENCOUNTER — Ambulatory Visit (HOSPITAL_BASED_OUTPATIENT_CLINIC_OR_DEPARTMENT_OTHER): Payer: Medicare Other | Admitting: Pain Medicine

## 2021-04-24 VITALS — BP 139/75 | HR 74 | Temp 96.8°F | Resp 16 | Ht 60.0 in | Wt 130.0 lb

## 2021-04-24 DIAGNOSIS — S32040S Wedge compression fracture of fourth lumbar vertebra, sequela: Secondary | ICD-10-CM | POA: Insufficient documentation

## 2021-04-24 DIAGNOSIS — M8000XS Age-related osteoporosis with current pathological fracture, unspecified site, sequela: Secondary | ICD-10-CM | POA: Insufficient documentation

## 2021-04-24 DIAGNOSIS — M545 Low back pain, unspecified: Secondary | ICD-10-CM | POA: Insufficient documentation

## 2021-04-24 DIAGNOSIS — M549 Dorsalgia, unspecified: Secondary | ICD-10-CM | POA: Insufficient documentation

## 2021-04-24 DIAGNOSIS — M542 Cervicalgia: Secondary | ICD-10-CM

## 2021-04-24 DIAGNOSIS — M7918 Myalgia, other site: Secondary | ICD-10-CM

## 2021-04-24 DIAGNOSIS — S32030S Wedge compression fracture of third lumbar vertebra, sequela: Secondary | ICD-10-CM

## 2021-04-24 DIAGNOSIS — G8929 Other chronic pain: Secondary | ICD-10-CM

## 2021-04-24 DIAGNOSIS — S32010S Wedge compression fracture of first lumbar vertebra, sequela: Secondary | ICD-10-CM | POA: Insufficient documentation

## 2021-04-24 DIAGNOSIS — M47816 Spondylosis without myelopathy or radiculopathy, lumbar region: Secondary | ICD-10-CM | POA: Insufficient documentation

## 2021-04-24 DIAGNOSIS — M25512 Pain in left shoulder: Secondary | ICD-10-CM | POA: Insufficient documentation

## 2021-04-24 DIAGNOSIS — G894 Chronic pain syndrome: Secondary | ICD-10-CM | POA: Insufficient documentation

## 2021-04-24 DIAGNOSIS — M546 Pain in thoracic spine: Secondary | ICD-10-CM | POA: Insufficient documentation

## 2021-04-24 DIAGNOSIS — M5416 Radiculopathy, lumbar region: Secondary | ICD-10-CM | POA: Insufficient documentation

## 2021-04-24 DIAGNOSIS — M8008XA Age-related osteoporosis with current pathological fracture, vertebra(e), initial encounter for fracture: Secondary | ICD-10-CM | POA: Diagnosis not present

## 2021-04-24 DIAGNOSIS — R29898 Other symptoms and signs involving the musculoskeletal system: Secondary | ICD-10-CM | POA: Insufficient documentation

## 2021-04-24 DIAGNOSIS — M50322 Other cervical disc degeneration at C5-C6 level: Secondary | ICD-10-CM | POA: Diagnosis not present

## 2021-04-24 DIAGNOSIS — S22080S Wedge compression fracture of T11-T12 vertebra, sequela: Secondary | ICD-10-CM | POA: Insufficient documentation

## 2021-04-24 DIAGNOSIS — M47812 Spondylosis without myelopathy or radiculopathy, cervical region: Secondary | ICD-10-CM | POA: Diagnosis not present

## 2021-04-24 DIAGNOSIS — M778 Other enthesopathies, not elsewhere classified: Secondary | ICD-10-CM | POA: Insufficient documentation

## 2021-04-24 MED ORDER — MAGNESIUM OXIDE -MG SUPPLEMENT 500 MG PO CAPS: 1.0000 | ORAL_CAPSULE | Freq: Two times a day (BID) | ORAL | 0 refills | Status: AC

## 2021-04-24 MED ORDER — VITAMIN D3 125 MCG (5000 UT) PO CAPS: 1.0000 | ORAL_CAPSULE | Freq: Every day | ORAL | 2 refills | Status: AC

## 2021-04-24 MED ORDER — CALCIUM CARBONATE 1500 (600 CA) MG PO TABS: 600.0000 mg | ORAL_TABLET | Freq: Two times a day (BID) | ORAL | 2 refills | Status: DC

## 2021-04-24 NOTE — Progress Notes (Signed)
Safety precautions to be maintained throughout the outpatient stay will include: orient to surroundings, keep bed in low position, maintain call bell within reach at all times, provide assistance with transfer out of bed and ambulation.  

## 2021-04-26 ENCOUNTER — Encounter: Payer: Self-pay | Admitting: Urology

## 2021-04-26 ENCOUNTER — Ambulatory Visit (INDEPENDENT_AMBULATORY_CARE_PROVIDER_SITE_OTHER): Payer: Medicare Other | Admitting: Urology

## 2021-04-26 ENCOUNTER — Telehealth: Payer: Self-pay | Admitting: Urology

## 2021-04-26 ENCOUNTER — Ambulatory Visit
Admission: RE | Admit: 2021-04-26 | Discharge: 2021-04-26 | Disposition: A | Discharge status: Home / Self Care | Payer: Medicare Other | Attending: Urology | Admitting: Urology

## 2021-04-26 ENCOUNTER — Other Ambulatory Visit: Payer: Self-pay | Admitting: Urology

## 2021-04-26 ENCOUNTER — Ambulatory Visit
Admission: RE | Admit: 2021-04-26 | Discharge: 2021-04-26 | Disposition: A | Discharge status: Home / Self Care | Payer: Medicare Other | Source: Ambulatory Visit | Attending: Urology | Admitting: Urology

## 2021-04-26 ENCOUNTER — Other Ambulatory Visit: Payer: Self-pay

## 2021-04-26 VITALS — BP 122/72 | HR 83 | Ht 60.0 in | Wt 130.0 lb

## 2021-04-26 DIAGNOSIS — I878 Other specified disorders of veins: Secondary | ICD-10-CM | POA: Diagnosis not present

## 2021-04-26 DIAGNOSIS — R059 Cough, unspecified: Secondary | ICD-10-CM | POA: Diagnosis not present

## 2021-04-26 DIAGNOSIS — R0602 Shortness of breath: Secondary | ICD-10-CM | POA: Diagnosis not present

## 2021-04-26 DIAGNOSIS — N200 Calculus of kidney: Secondary | ICD-10-CM | POA: Diagnosis not present

## 2021-04-26 DIAGNOSIS — Z87442 Personal history of urinary calculi: Secondary | ICD-10-CM | POA: Diagnosis not present

## 2021-04-26 NOTE — Progress Notes (Signed)
04/26/21 9:46 AM   [name] 01/04/1936 [id number]  CC: Left ureteral stone  HPI: 85 year old female with pulmonary fibrosis on 2 L oxygen overnight, chronic back pain who presented to the ED on 04/21/2021 with severe acute onset of left-sided flank and groin pain and nausea.  CT showed a 7 mm left distal ureteral stone with upstream hydronephrosis and no other left-sided renal stones.  Urinalysis with 20-50 RBCs, 6-10 WBCs, no bacteria, with calcium oxalate crystals.  She was discharged with pain medications and urology follow-up.  She has overall been doing well since discharge, but did have some severe left-sided flank and groin pain this morning that resolved quickly.  She has not seen a stone pass.  KUB today shows obvious persistence of the left distal ureteral stone.   PMH: Past Medical History:  Diagnosis Date   Altered mental state    Anxiety    BMI 30.0-30.9,adult    Cellulitis    Chest pain, atypical    Compression fracture of L4 lumbar vertebra    Constipation    Cystitis    Cystocele    Diverticulosis    Fatigue    Fibrosis, idiopathic pulmonary (HCC)    Gastroenteritis    History of back surgery    History of herniated intervertebral disc    HTN (hypertension)    Hypercholesteremia    Hypocalcemia    Incontinence of urine    OA (osteoarthritis)    shoulder and back   Obesity    Osteopenia    Pneumonia    Shortness of breath    Sleep apnea    Squamous cell carcinoma of skin 07/16/2016   Right upper lateral eyebrow. SCCis.   Squamous cell carcinoma of skin 01/13/2018   Left temporal hairline. WD SCC with superficial infiltration.   Thrush    Vitamin D deficiency     Surgical History: Past Surgical History:  Procedure Laterality Date   BACK SURGERY     CATARACT EXTRACTION W/PHACO Left 04/24/2016   Procedure: CATARACT EXTRACTION PHACO AND INTRAOCULAR LENS PLACEMENT (IOC);  Surgeon: Leandrew Koyanagi, MD;  Location: [place];   Service: Ophthalmology;  Laterality: Left;   CATARACT EXTRACTION W/PHACO Right 07/14/2017   Procedure: CATARACT EXTRACTION PHACO AND INTRAOCULAR LENS PLACEMENT ([place])  RIGHT;  Surgeon: Leandrew Koyanagi, MD;  Location: [place];  Service: Ophthalmology;  Laterality: Right;   CHOLECYSTECTOMY     ESOPHAGOGASTRODUODENOSCOPY (EGD) WITH PROPOFOL N/A 10/13/2018   Procedure: ESOPHAGOGASTRODUODENOSCOPY (EGD) WITH PROPOFOL;  Surgeon: [name], MD;  Location: [facility] ENDOSCOPY;  Service: Gastroenterology;  Laterality: N/A;   FLEXIBLE SIGMOIDOSCOPY N/A 10/13/2018   Procedure: FLEXIBLE SIGMOIDOSCOPY;  Surgeon: [name], MD;  Location: [facility] ENDOSCOPY;  Service: Gastroenterology;  Laterality: N/A;   FLEXIBLE SIGMOIDOSCOPY N/A 09/09/2019   Procedure: FLEXIBLE SIGMOIDOSCOPY;  Surgeon: [name], MD;  Location: [facility] ENDOSCOPY;  Service: Gastroenterology;  Laterality: N/A;   FLEXIBLE SIGMOIDOSCOPY N/A 08/04/2020   Procedure: FLEXIBLE SIGMOIDOSCOPY;  Surgeon: [name], MD;  Location: [facility] ENDOSCOPY;  Service: Gastroenterology;  Laterality: N/A;  Per Dr. Vicente Males, unsedated   HIP ARTHROPLASTY Right 09/27/2020   Procedure: ARTHROPLASTY BIPOLAR HIP (HEMIARTHROPLASTY);  Surgeon: Hessie Knows, MD;  Location: [facility] ORS;  Service: Orthopedics;  Laterality: Right;   ROTATOR CUFF REPAIR Bilateral    left-12/2009; right-03/2009 dr.califf   TONSILLECTOMY     TOTAL SHOULDER REPLACEMENT Left      Family History: Family History  Problem Relation Age of Onset   COPD Mother  Cancer Brother        colon    Social History:  reports that she has never smoked. She has never used smokeless tobacco. She reports that she does not drink alcohol and does not use drugs.  Physical Exam: BP 122/72   Pulse 83   Ht 5' (1.524 m)   Wt 130 lb (59 kg)   BMI 25.39 kg/m    Constitutional:  Alert and oriented, No acute distress. Cardiovascular: Regular rate and rhythm Respiratory: Normal respiratory effort, no increased  work of breathing. GI: Abdomen is soft, nontender, nondistended, no abdominal masses   Laboratory Data: Reviewed, see HPI  Pertinent Imaging: I have personally viewed and interpreted the CT dated 04/21/2021 showing a 7 mm left distal ureteral stone with upstream hydronephrosis, no left-sided renal stones.  KUB today with persistent 7 mm left distal ureteral stone  Assessment & Plan:   85 year old female with pulmonary fibrosis and chronic back pain who presented to the ED on 8/20 with acute onset of nausea and severe left flank pain and CT showed a 7 mm left distal ureteral stone, urinalysis without evidence of infection.  She continues to have some intermittent left-sided groin and flank pain, and stone still present on KUB today.  We discussed various treatment options for urolithiasis including observation with or without medical expulsive therapy, shockwave lithotripsy (SWL), ureteroscopy and laser lithotripsy with stent placement, and percutaneous nephrolithotomy.  We discussed that management is based on stone size, location, density, patient co-morbidities, and patient preference.   Stones <81m in size have a >80% spontaneous passage rate. Data surrounding the use of tamsulosin for medical expulsive therapy is controversial, but meta analyses suggests it is most efficacious for distal stones between 5-131min size. Possible side effects include dizziness/lightheadedness, and retrograde ejaculation.  SWL has a lower stone free rate in a single procedure, but also a lower complication rate compared to ureteroscopy and avoids a stent and associated stent related symptoms. Possible complications include renal hematoma, steinstrasse, and need for additional treatment.  Ureteroscopy with laser lithotripsy and stent placement has a higher stone free rate than SWL in a single procedure, however increased complication rate including possible infection, ureteral injury, bleeding, and stent related  morbidity. Common stent related symptoms include dysuria, urgency/frequency, and flank pain.  Left ureteroscopy, laser lithotripsy, stent placement this week  BrNickolas MadridMD 04/26/2021  [address] [id number]

## 2021-04-26 NOTE — H&P (View-Only) (Signed)
04/26/21 9:46 AM   [name] Jun 20, 1936 [id number]  CC: Left ureteral stone  HPI: 85 year old female with pulmonary fibrosis on 2 L oxygen overnight, chronic back pain who presented to the ED on 04/21/2021 with severe acute onset of left-sided flank and groin pain and nausea.  CT showed a 7 mm left distal ureteral stone with upstream hydronephrosis and no other left-sided renal stones.  Urinalysis with 20-50 RBCs, 6-10 WBCs, no bacteria, with calcium oxalate crystals.  She was discharged with pain medications and urology follow-up.  She has overall been doing well since discharge, but did have some severe left-sided flank and groin pain this morning that resolved quickly.  She has not seen a stone pass.  KUB today shows obvious persistence of the left distal ureteral stone.   PMH: Past Medical History:  Diagnosis Date   Altered mental state    Anxiety    BMI 30.0-30.9,adult    Cellulitis    Chest pain, atypical    Compression fracture of L4 lumbar vertebra    Constipation    Cystitis    Cystocele    Diverticulosis    Fatigue    Fibrosis, idiopathic pulmonary (HCC)    Gastroenteritis    History of back surgery    History of herniated intervertebral disc    HTN (hypertension)    Hypercholesteremia    Hypocalcemia    Incontinence of urine    OA (osteoarthritis)    shoulder and back   Obesity    Osteopenia    Pneumonia    Shortness of breath    Sleep apnea    Squamous cell carcinoma of skin 07/16/2016   Right upper lateral eyebrow. SCCis.   Squamous cell carcinoma of skin 01/13/2018   Left temporal hairline. WD SCC with superficial infiltration.   Thrush    Vitamin D deficiency     Surgical History: Past Surgical History:  Procedure Laterality Date   BACK SURGERY     CATARACT EXTRACTION W/PHACO Left 04/24/2016   Procedure: CATARACT EXTRACTION PHACO AND INTRAOCULAR LENS PLACEMENT (IOC);  Surgeon: Leandrew Koyanagi, MD;  Location: [place];   Service: Ophthalmology;  Laterality: Left;   CATARACT EXTRACTION W/PHACO Right 07/14/2017   Procedure: CATARACT EXTRACTION PHACO AND INTRAOCULAR LENS PLACEMENT ([place])  RIGHT;  Surgeon: Leandrew Koyanagi, MD;  Location: [place];  Service: Ophthalmology;  Laterality: Right;   CHOLECYSTECTOMY     ESOPHAGOGASTRODUODENOSCOPY (EGD) WITH PROPOFOL N/A 10/13/2018   Procedure: ESOPHAGOGASTRODUODENOSCOPY (EGD) WITH PROPOFOL;  Surgeon: [name], MD;  Location: [facility] ENDOSCOPY;  Service: Gastroenterology;  Laterality: N/A;   FLEXIBLE SIGMOIDOSCOPY N/A 10/13/2018   Procedure: FLEXIBLE SIGMOIDOSCOPY;  Surgeon: [name], MD;  Location: [facility] ENDOSCOPY;  Service: Gastroenterology;  Laterality: N/A;   FLEXIBLE SIGMOIDOSCOPY N/A 09/09/2019   Procedure: FLEXIBLE SIGMOIDOSCOPY;  Surgeon: [name], MD;  Location: [facility] ENDOSCOPY;  Service: Gastroenterology;  Laterality: N/A;   FLEXIBLE SIGMOIDOSCOPY N/A 08/04/2020   Procedure: FLEXIBLE SIGMOIDOSCOPY;  Surgeon: [name], MD;  Location: [facility] ENDOSCOPY;  Service: Gastroenterology;  Laterality: N/A;  Per Dr. Vicente Males, unsedated   HIP ARTHROPLASTY Right 09/27/2020   Procedure: ARTHROPLASTY BIPOLAR HIP (HEMIARTHROPLASTY);  Surgeon: Hessie Knows, MD;  Location: [facility] ORS;  Service: Orthopedics;  Laterality: Right;   ROTATOR CUFF REPAIR Bilateral    left-12/2009; right-03/2009 dr.califf   TONSILLECTOMY     TOTAL SHOULDER REPLACEMENT Left      Family History: Family History  Problem Relation Age of Onset   COPD Mother  Cancer Brother        colon    Social History:  reports that she has never smoked. She has never used smokeless tobacco. She reports that she does not drink alcohol and does not use drugs.  Physical Exam: BP 122/72   Pulse 83   Ht 5' (1.524 m)   Wt 130 lb (59 kg)   BMI 25.39 kg/m    Constitutional:  Alert and oriented, No acute distress. Cardiovascular: Regular rate and rhythm Respiratory: Normal respiratory effort, no increased  work of breathing. GI: Abdomen is soft, nontender, nondistended, no abdominal masses   Laboratory Data: Reviewed, see HPI  Pertinent Imaging: I have personally viewed and interpreted the CT dated 04/21/2021 showing a 7 mm left distal ureteral stone with upstream hydronephrosis, no left-sided renal stones.  KUB today with persistent 7 mm left distal ureteral stone  Assessment & Plan:   85 year old female with pulmonary fibrosis and chronic back pain who presented to the ED on 8/20 with acute onset of nausea and severe left flank pain and CT showed a 7 mm left distal ureteral stone, urinalysis without evidence of infection.  She continues to have some intermittent left-sided groin and flank pain, and stone still present on KUB today.  We discussed various treatment options for urolithiasis including observation with or without medical expulsive therapy, shockwave lithotripsy (SWL), ureteroscopy and laser lithotripsy with stent placement, and percutaneous nephrolithotomy.  We discussed that management is based on stone size, location, density, patient co-morbidities, and patient preference.   Stones <26m in size have a >80% spontaneous passage rate. Data surrounding the use of tamsulosin for medical expulsive therapy is controversial, but meta analyses suggests it is most efficacious for distal stones between 5-113min size. Possible side effects include dizziness/lightheadedness, and retrograde ejaculation.  SWL has a lower stone free rate in a single procedure, but also a lower complication rate compared to ureteroscopy and avoids a stent and associated stent related symptoms. Possible complications include renal hematoma, steinstrasse, and need for additional treatment.  Ureteroscopy with laser lithotripsy and stent placement has a higher stone free rate than SWL in a single procedure, however increased complication rate including possible infection, ureteral injury, bleeding, and stent related  morbidity. Common stent related symptoms include dysuria, urgency/frequency, and flank pain.  Left ureteroscopy, laser lithotripsy, stent placement this week  BrNickolas MadridMD 04/26/2021  [address] RoadSuHoliday City South[place] [id number]

## 2021-04-26 NOTE — Patient Instructions (Signed)
Laser Therapy for Kidney Stones Laser therapy for kidney stones is a procedure to break up small, hard mineral deposits that form in the kidney (kidney stones). The procedure is done using a device that produces a focused beam of light (laser). The laser breaks up kidney stones into pieces that are small enough to be passed out of the body through urination or removed from the body during the procedure. You may need laser therapy if you have kidney stones that arepainful or block your urinary tract. This procedure is done by inserting a tube (ureteroscope) into your kidney through the urethral opening. The urethra is the part of the body that drains urine from the bladder. In women, the urethra opens above the vaginal opening. In men, the urethra opens at the tip of the penis. The ureteroscope is inserted through the urethra, and surgical instruments are moved through the bladder and the muscular tube that connects the kidney to the bladder (ureter) until they reach the kidney. Tell a health care provider about: Any allergies you have. All medicines you are taking, including vitamins, herbs, eye drops, creams, and over-the-counter medicines. Any problems you or family members have had with anesthetic medicines. Any blood disorders you have. Any surgeries you have had. Any medical conditions you have. Whether you are pregnant or may be pregnant. What are the risks? Generally, this is a safe procedure. However, problems may occur, including: Infection. Bleeding. Allergic reactions to medicines. Damage to the urethra, bladder, or ureter. Urinary tract infection (UTI). Narrowing of the urethra (urethral stricture). Difficulty passing urine. Blockage of the kidney caused by a fragment of kidney stone. What happens before the procedure? Medicines Ask your health care provider about: Changing or stopping your regular medicines. This is especially important if you are taking diabetes medicines or  blood thinners. Taking medicines such as aspirin and ibuprofen. These medicines can thin your blood. Do not take these medicines unless your health care provider tells you to take them. Taking over-the-counter medicines, vitamins, herbs, and supplements. Eating and drinking Follow instructions from your health care provider about eating and drinking, which may include: 8 hours before the procedure - stop eating heavy meals or foods, such as meat, fried foods, or fatty foods. 6 hours before the procedure - stop eating light meals or foods, such as toast or cereal. 6 hours before the procedure - stop drinking milk or drinks that contain milk. 2 hours before the procedure - stop drinking clear liquids. Staying hydrated Follow instructions from your health care provider about hydration, which may include: Up to 2 hours before the procedure - you may continue to drink clear liquids, such as water, clear fruit juice, black coffee, and plain tea.  General instructions You may have a physical exam before the procedure. You may also have tests, such as imaging tests and blood or urine tests. If your ureter is too narrow, your health care provider may place a soft, flexible tube (stent) inside of it. The stent may be placed days or weeks before your laser therapy procedure. Plan to have someone take you home from the hospital or clinic. If you will be going home right after the procedure, plan to have someone stay with you for 24 hours. Do not use any products that contain nicotine or tobacco for at least 4 weeks before the procedure. These products include cigarettes, e-cigarettes, and chewing tobacco. If you need help quitting, ask your health care provider. Ask your health care provider: How your surgical  site will be marked or identified. What steps will be taken to help prevent infection. These may include: Removing hair at the surgery site. Washing skin with a germ-killing soap. Taking antibiotic  medicine. What happens during the procedure?  An IV will be inserted into one of your veins. You will be given one or more of the following: A medicine to help you relax (sedative). A medicine to numb the area (local anesthetic). A medicine to make you fall asleep (general anesthetic). A ureteroscope will be inserted into your urethra. The ureteroscope will send images to a video screen in the operating room to guide your surgeon to the area of your kidney that will be treated. A small, flexible tube will be threaded through the ureteroscope and into your bladder and ureter, up to your kidney. The laser device will be inserted into your kidney through the tube. Your surgeon will pulse the laser on and off to break up kidney stones. A surgical instrument that has a tiny wire basket may be inserted through the tube into your kidney to remove the pieces of broken kidney stone. The procedure may vary among health care providers and hospitals. What happens after the procedure? Your blood pressure, heart rate, breathing rate, and blood oxygen level will be monitored until you leave the hospital or clinic. You will be given pain medicine as needed. You may continue to receive antibiotics. You may have a stent temporarily placed in your ureter. Do not drive for 24 hours if you were given a sedative during your procedure. You may be given a strainer to collect any stone fragments that you pass in your urine. Your health care provider may have these tested. Summary Laser therapy for kidney stones is a procedure to break up kidney stones into pieces that are small enough to be passed out of the body through urination or removed during the procedure. Follow instructions from your health care provider about eating and drinking before the procedure. During the procedure, the ureteroscope will send images to a video screen to guide your surgeon to the area of your kidney that will be treated. Do not drive  for 24 hours if you were given a sedative during your procedure. This information is not intended to replace advice given to you by your health care provider. Make sure you discuss any questions you have with your healthcare provider. Document Revised: 04/30/2018 Document Reviewed: 04/30/2018 Elsevier Patient Education  [place].

## 2021-04-27 ENCOUNTER — Other Ambulatory Visit: Payer: Self-pay

## 2021-04-27 ENCOUNTER — Ambulatory Visit: Payer: Medicare Other

## 2021-04-27 ENCOUNTER — Ambulatory Visit: Payer: Medicare Other | Admitting: Registered Nurse

## 2021-04-27 ENCOUNTER — Ambulatory Visit
Admission: RE | Admit: 2021-04-27 | Discharge: 2021-04-27 | Disposition: A | Discharge status: Home / Self Care | Payer: Medicare Other | Attending: Urology | Admitting: Urology

## 2021-04-27 ENCOUNTER — Encounter: Payer: Self-pay | Admitting: Urology

## 2021-04-27 ENCOUNTER — Encounter
Admission: RE | Disposition: A | Discharge status: Home / Self Care | Payer: Self-pay | Source: Home / Self Care | Attending: Urology

## 2021-04-27 DIAGNOSIS — Z96641 Presence of right artificial hip joint: Secondary | ICD-10-CM | POA: Insufficient documentation

## 2021-04-27 DIAGNOSIS — Z96612 Presence of left artificial shoulder joint: Secondary | ICD-10-CM | POA: Diagnosis not present

## 2021-04-27 DIAGNOSIS — Z9981 Dependence on supplemental oxygen: Secondary | ICD-10-CM | POA: Diagnosis not present

## 2021-04-27 DIAGNOSIS — M1990 Unspecified osteoarthritis, unspecified site: Secondary | ICD-10-CM | POA: Diagnosis not present

## 2021-04-27 DIAGNOSIS — Z85828 Personal history of other malignant neoplasm of skin: Secondary | ICD-10-CM | POA: Insufficient documentation

## 2021-04-27 DIAGNOSIS — N132 Hydronephrosis with renal and ureteral calculous obstruction: Secondary | ICD-10-CM | POA: Insufficient documentation

## 2021-04-27 DIAGNOSIS — N200 Calculus of kidney: Secondary | ICD-10-CM

## 2021-04-27 DIAGNOSIS — J8410 Pulmonary fibrosis, unspecified: Secondary | ICD-10-CM | POA: Insufficient documentation

## 2021-04-27 DIAGNOSIS — N201 Calculus of ureter: Secondary | ICD-10-CM | POA: Diagnosis not present

## 2021-04-27 HISTORY — PX: CYSTOSCOPY/URETEROSCOPY/HOLMIUM LASER/STENT PLACEMENT: SHX6546

## 2021-04-27 SURGERY — CYSTOSCOPY/URETEROSCOPY/HOLMIUM LASER/STENT PLACEMENT
Anesthesia: General | Laterality: Left

## 2021-04-27 MED ORDER — CHLORHEXIDINE GLUCONATE 0.12 % MT SOLN
OROMUCOSAL | Status: AC
  Filled 2021-04-27: qty 15

## 2021-04-27 MED ORDER — ONDANSETRON HCL 4 MG/2ML IJ SOLN
INTRAMUSCULAR | Status: AC
  Filled 2021-04-27: qty 2

## 2021-04-27 MED ORDER — PROPOFOL 10 MG/ML IV BOLUS
INTRAVENOUS | Status: AC
  Filled 2021-04-27: qty 20

## 2021-04-27 MED ORDER — SUGAMMADEX SODIUM 200 MG/2ML IV SOLN
INTRAVENOUS | Status: DC | PRN
  Administered 2021-04-27: 150 mg via INTRAVENOUS

## 2021-04-27 MED ORDER — ROCURONIUM BROMIDE 10 MG/ML (PF) SYRINGE
PREFILLED_SYRINGE | INTRAVENOUS | Status: AC
  Filled 2021-04-27: qty 10

## 2021-04-27 MED ORDER — CEFAZOLIN SODIUM-DEXTROSE 2-4 GM/100ML-% IV SOLN
2.0000 g | INTRAVENOUS | Status: AC
  Administered 2021-04-27: 2 g via INTRAVENOUS

## 2021-04-27 MED ORDER — EPHEDRINE SULFATE 50 MG/ML IJ SOLN
INTRAMUSCULAR | Status: DC | PRN
  Administered 2021-04-27: 10 mg via INTRAVENOUS
  Administered 2021-04-27: 5 mg via INTRAVENOUS

## 2021-04-27 MED ORDER — CEFAZOLIN SODIUM-DEXTROSE 2-4 GM/100ML-% IV SOLN
INTRAVENOUS | Status: AC
  Filled 2021-04-27: qty 100

## 2021-04-27 MED ORDER — LIDOCAINE HCL (CARDIAC) PF 100 MG/5ML IV SOSY
PREFILLED_SYRINGE | INTRAVENOUS | Status: DC | PRN
  Administered 2021-04-27: 60 mg via INTRAVENOUS

## 2021-04-27 MED ORDER — KETOROLAC TROMETHAMINE 30 MG/ML IJ SOLN
INTRAMUSCULAR | Status: AC
  Filled 2021-04-27: qty 1

## 2021-04-27 MED ORDER — BELLADONNA ALKALOIDS-OPIUM 16.2-60 MG RE SUPP
RECTAL | Status: AC
  Filled 2021-04-27: qty 1

## 2021-04-27 MED ORDER — FENTANYL CITRATE (PF) 100 MCG/2ML IJ SOLN: 25.0000 ug | INTRAMUSCULAR | Status: DC | PRN

## 2021-04-27 MED ORDER — CHLORHEXIDINE GLUCONATE 0.12 % MT SOLN: 15.0000 mL | Freq: Once | OROMUCOSAL | Status: DC

## 2021-04-27 MED ORDER — ORAL CARE MOUTH RINSE: 15.0000 mL | Freq: Once | OROMUCOSAL | Status: DC

## 2021-04-27 MED ORDER — ROCURONIUM BROMIDE 100 MG/10ML IV SOLN
INTRAVENOUS | Status: DC | PRN
  Administered 2021-04-27: 40 mg via INTRAVENOUS

## 2021-04-27 MED ORDER — BELLADONNA ALKALOIDS-OPIUM 16.2-60 MG RE SUPP
RECTAL | Status: DC | PRN
  Administered 2021-04-27: 1 via RECTAL

## 2021-04-27 MED ORDER — FENTANYL CITRATE (PF) 100 MCG/2ML IJ SOLN
INTRAMUSCULAR | Status: DC | PRN
  Administered 2021-04-27 (×2): 50 ug via INTRAVENOUS

## 2021-04-27 MED ORDER — EPHEDRINE 5 MG/ML INJ
INTRAVENOUS | Status: AC
  Filled 2021-04-27: qty 5

## 2021-04-27 MED ORDER — ONDANSETRON HCL 4 MG/2ML IJ SOLN: 4.0000 mg | Freq: Once | INTRAMUSCULAR | Status: DC | PRN

## 2021-04-27 MED ORDER — KETOROLAC TROMETHAMINE 30 MG/ML IJ SOLN
INTRAMUSCULAR | Status: DC | PRN
  Administered 2021-04-27: 15 mg via INTRAVENOUS

## 2021-04-27 MED ORDER — IOHEXOL 180 MG/ML  SOLN
INTRAMUSCULAR | Status: DC | PRN
  Administered 2021-04-27: 5 mL

## 2021-04-27 MED ORDER — LIDOCAINE HCL (PF) 2 % IJ SOLN
INTRAMUSCULAR | Status: AC
  Filled 2021-04-27: qty 5

## 2021-04-27 MED ORDER — LACTATED RINGERS IV SOLN: INTRAVENOUS | Status: DC

## 2021-04-27 MED ORDER — DEXAMETHASONE SODIUM PHOSPHATE 10 MG/ML IJ SOLN
INTRAMUSCULAR | Status: AC
  Filled 2021-04-27: qty 1

## 2021-04-27 MED ORDER — PROPOFOL 10 MG/ML IV BOLUS
INTRAVENOUS | Status: DC | PRN
  Administered 2021-04-27: 80 mg via INTRAVENOUS

## 2021-04-27 MED ORDER — HYDROCODONE-ACETAMINOPHEN 5-325 MG PO TABS: 1.0000 | ORAL_TABLET | Freq: Four times a day (QID) | ORAL | 0 refills | Status: AC | PRN

## 2021-04-27 MED ORDER — ONDANSETRON HCL 4 MG/2ML IJ SOLN
INTRAMUSCULAR | Status: DC | PRN
  Administered 2021-04-27: 4 mg via INTRAVENOUS

## 2021-04-27 MED ORDER — DEXAMETHASONE SODIUM PHOSPHATE 10 MG/ML IJ SOLN
INTRAMUSCULAR | Status: DC | PRN
  Administered 2021-04-27: 10 mg via INTRAVENOUS

## 2021-04-27 MED ORDER — FENTANYL CITRATE (PF) 100 MCG/2ML IJ SOLN
INTRAMUSCULAR | Status: AC
  Filled 2021-04-27: qty 2

## 2021-04-27 SURGICAL SUPPLY — 31 items
BAG DRAIN CYSTO-URO LG1000N (MISCELLANEOUS) ×2 IMPLANT
BRUSH SCRUB EZ 1% IODOPHOR (MISCELLANEOUS) ×2 IMPLANT
CATH URET FLEX-TIP 2 LUMEN 10F (CATHETERS) IMPLANT
CATH URETL OPEN 5X70 (CATHETERS) IMPLANT
CNTNR SPEC 2.5X3XGRAD LEK (MISCELLANEOUS)
CONT SPEC 4OZ STER OR WHT (MISCELLANEOUS)
CONT SPEC 4OZ STRL OR WHT (MISCELLANEOUS)
CONTAINER SPEC 2.5X3XGRAD LEK (MISCELLANEOUS) IMPLANT
DRAPE UTILITY 15X26 TOWEL STRL (DRAPES) ×2 IMPLANT
DRSG TEGADERM 2-3/8X2-3/4 SM (GAUZE/BANDAGES/DRESSINGS) ×2 IMPLANT
GAUZE 4X4 16PLY ~~LOC~~+RFID DBL (SPONGE) ×4 IMPLANT
GLOVE SURG UNDER POLY LF SZ7.5 (GLOVE) ×2 IMPLANT
GOWN STRL REUS W/ TWL LRG LVL3 (GOWN DISPOSABLE) ×1 IMPLANT
GOWN STRL REUS W/ TWL XL LVL3 (GOWN DISPOSABLE) ×1 IMPLANT
GOWN STRL REUS W/TWL LRG LVL3 (GOWN DISPOSABLE) ×2
GOWN STRL REUS W/TWL XL LVL3 (GOWN DISPOSABLE) ×2
GUIDEWIRE STR DUAL SENSOR (WIRE) ×2 IMPLANT
INFUSOR MANOMETER BAG 3000ML (MISCELLANEOUS) ×2 IMPLANT
IV NS IRRIG 3000ML ARTHROMATIC (IV SOLUTION) ×2 IMPLANT
KIT TURNOVER CYSTO (KITS) ×2 IMPLANT
PACK CYSTO AR (MISCELLANEOUS) ×2 IMPLANT
SET CYSTO W/LG BORE CLAMP LF (SET/KITS/TRAYS/PACK) ×2 IMPLANT
SHEATH URETERAL 12FRX35CM (MISCELLANEOUS) IMPLANT
STENT URET 6FRX24 CONTOUR (STENTS) ×2 IMPLANT
STENT URET 6FRX26 CONTOUR (STENTS) IMPLANT
SURGILUBE 2OZ TUBE FLIPTOP (MISCELLANEOUS) ×2 IMPLANT
SYR 10ML LL (SYRINGE) ×2 IMPLANT
TRACTIP FLEXIVA PULSE ID 200 (Laser) IMPLANT
VALVE UROSEAL ADJ ENDO (VALVE) IMPLANT
WATER STERILE IRR 1000ML POUR (IV SOLUTION) ×2 IMPLANT
WATER STERILE IRR 500ML POUR (IV SOLUTION) ×2 IMPLANT

## 2021-04-27 NOTE — Discharge Instructions (Signed)
AMBULATORY SURGERY  ?DISCHARGE INSTRUCTIONS ? ? ?The drugs that you were given will stay in your system until tomorrow so for the next 24 hours you should not: ? ?Drive an automobile ?Make any legal decisions ?Drink any alcoholic beverage ? ? ?You may resume regular meals tomorrow.  Today it is better to start with liquids and gradually work up to solid foods. ? ?You may eat anything you prefer, but it is better to start with liquids, then soup and crackers, and gradually work up to solid foods. ? ? ?Please notify your doctor immediately if you have any unusual bleeding, trouble breathing, redness and pain at the surgery site, drainage, fever, or pain not relieved by medication. ? ? ? ?Additional Instructions: ? ? ? ?Please contact your physician with any problems or Same Day Surgery at [phone], Monday through Friday 6 am to 4 pm, or [facility] [place] number at [phone].  ?

## 2021-04-27 NOTE — Anesthesia Preprocedure Evaluation (Signed)
Anesthesia Evaluation  Patient identified by MRN, date of birth, ID band Patient awake    Reviewed: Allergy & Precautions, H&P , NPO status , Patient's Chart, lab work & pertinent test results  History of Anesthesia Complications Negative for: history of anesthetic complications  Airway Mallampati: III  TM Distance: <3 FB Neck ROM: limited    Dental  (+) Chipped, Poor Dentition, Dental Advidsory Given   Pulmonary shortness of breath and Long-Term Oxygen Therapy, sleep apnea , pneumonia, neg COPD, neg recent URI,           Cardiovascular Exercise Tolerance: Good hypertension, (-) angina(-) Past MI and (-) DOE (-) dysrhythmias (-) Valvular Problems/Murmurs     Neuro/Psych PSYCHIATRIC DISORDERS Anxiety negative neurological ROS     GI/Hepatic negative GI ROS, Neg liver ROS, neg GERD  ,  Endo/Other  negative endocrine ROS  Renal/GU negative Renal ROS  negative genitourinary   Musculoskeletal  (+) Arthritis ,   Abdominal   Peds  Hematology negative hematology ROS (+)   Anesthesia Other Findings Past Medical History: No date: Altered mental state No date: Anxiety No date: BMI 30.0-30.9,adult No date: Cellulitis No date: Chest pain, atypical No date: Compression fracture of L4 lumbar vertebra No date: Constipation No date: Cystitis No date: Cystocele No date: Diverticulosis No date: Fatigue No date: Fibrosis, idiopathic pulmonary (HCC) No date: Gastroenteritis No date: History of back surgery No date: History of herniated intervertebral disc No date: HTN (hypertension) No date: Hypercholesteremia No date: Hypocalcemia No date: Incontinence of urine No date: OA (osteoarthritis)     Comment:  shoulder and back No date: Obesity No date: Osteopenia No date: Pneumonia No date: Shortness of breath No date: Sleep apnea No date: Thrush No date: Vitamin D deficiency  Past Surgical History: No date: BACK  SURGERY 04/24/2016: CATARACT EXTRACTION W/PHACO; Left     Comment:  Procedure: CATARACT EXTRACTION PHACO AND INTRAOCULAR               LENS PLACEMENT (IOC);  Surgeon: Leandrew Koyanagi, MD;               Location: [place];  Service: Ophthalmology;                Laterality: Left; 07/14/2017: CATARACT EXTRACTION W/PHACO; Right     Comment:  Procedure: CATARACT EXTRACTION PHACO AND INTRAOCULAR               LENS PLACEMENT ([place])  RIGHT;  Surgeon: Leandrew Koyanagi, MD;  Location: [place];  Service:               Ophthalmology;  Laterality: Right; No date: CHOLECYSTECTOMY 10/13/2018: ESOPHAGOGASTRODUODENOSCOPY (EGD) WITH PROPOFOL; N/A     Comment:  Procedure: ESOPHAGOGASTRODUODENOSCOPY (EGD) WITH               PROPOFOL;  Surgeon: [name], MD;  Location: [facility]               ENDOSCOPY;  Service: Gastroenterology;  Laterality: N/A; 10/13/2018: FLEXIBLE SIGMOIDOSCOPY; N/A     Comment:  Procedure: FLEXIBLE SIGMOIDOSCOPY;  Surgeon: [name], MD;  Location: [facility] ENDOSCOPY;  Service:               Gastroenterology;  Laterality: N/A; No date: ROTATOR CUFF REPAIR; Bilateral     Comment:  left-12/2009; right-03/2009  [email or web address] No date: TONSILLECTOMY No date: TOTAL SHOULDER REPLACEMENT; Left     Reproductive/Obstetrics negative OB ROS                             Anesthesia Physical  Anesthesia Plan  ASA: 4  Anesthesia Plan: General   Post-op Pain Management:    Induction: Intravenous  PONV Risk Score and Plan: Ondansetron, Dexamethasone and Treatment may vary due to age or medical condition  Airway Management Planned: Oral ETT  Additional Equipment:   Intra-op Plan:   Post-operative Plan: Extubation in OR  Informed Consent: I have reviewed the patients History and Physical, chart, labs and discussed the procedure including the risks, benefits and alternatives for the proposed anesthesia with the  patient or authorized representative who has indicated his/her understanding and acceptance.     Dental Advisory Given  Plan Discussed with: Anesthesiologist, CRNA and Surgeon  Anesthesia Plan Comments: (Patient consented for risks of anesthesia including but not limited to:  - adverse reactions to medications - risk of intubation if required - damage to teeth, lips or other oral mucosa - sore throat or hoarseness - Damage to heart, brain, lungs or loss of life  Patient voiced understanding.)        Anesthesia Quick Evaluation

## 2021-04-27 NOTE — Transfer of Care (Signed)
Immediate Anesthesia Transfer of Care Note  Patient: [name]  Procedure(s) Performed: CYSTOSCOPY/URETEROSCOPY/HOLMIUM LASER/STENT PLACEMENT (Left)  Patient Location: PACU  Anesthesia Type:General  Level of Consciousness: awake  Airway & Oxygen Therapy: Patient Spontanous Breathing and Patient connected to face mask oxygen  Post-op Assessment: Report given to RN and Post -op Vital signs reviewed and stable  Post vital signs: Reviewed and stable  Last Vitals:  Vitals Value Taken Time  BP 155/68 04/27/21 1225  Temp 36.7 C 04/27/21 1225  Pulse 85 04/27/21 1228  Resp 15 04/27/21 1228  SpO2 100 % 04/27/21 1228  Vitals shown include unvalidated device data.  Last Pain:  Vitals:   04/27/21 1117  TempSrc: Temporal  PainSc: 7          Complications: No notable events documented.

## 2021-04-27 NOTE — Interval H&P Note (Signed)
UROLOGY H&P UPDATE  Agree with prior H&P dated 8/25, 5m left distal ureteral stone and ongoing renal colic.  Cardiac: RRR Lungs: CTA bilaterally  Laterality: left Procedure: left ureteroscopy, laser lithotripsy, stent placement  We specifically discussed the risks ureteroscopy including bleeding, infection/sepsis, stent related symptoms including flank pain/urgency/frequency/incontinence/dysuria, ureteral injury, inability to access stone, or need for staged or additional procedures.   BBilley Co MD 04/27/2021

## 2021-04-27 NOTE — Progress Notes (Signed)
Pt's O2 dropped to 65% on no oxygen when she got up to transfer to chair. I called Dr. Randa Lynn and she stated that the husband can go home and get home oxygen but the pt will need to walk with 2L and maintain at least 90 pulse ox prior to discharge.

## 2021-04-27 NOTE — Anesthesia Procedure Notes (Signed)
Procedure Name: Intubation Date/Time: 04/27/2021 11:46 AM Performed by: Aline Brochure, CRNA Pre-anesthesia Checklist: Patient identified, Patient being monitored, Timeout performed, Emergency Drugs available and Suction available Patient Re-evaluated:Patient Re-evaluated prior to induction Oxygen Delivery Method: Circle system utilized Preoxygenation: Pre-oxygenation with 100% oxygen Induction Type: IV induction Ventilation: Mask ventilation without difficulty Laryngoscope Size: Mac and 3 Grade View: Grade I Tube type: Oral Tube size: 6.5 mm Number of attempts: 1 Airway Equipment and Method: Stylet Placement Confirmation: ETT inserted through vocal cords under direct vision, positive ETCO2 and breath sounds checked- equal and bilateral Secured at: 21 cm Tube secured with: Tape Dental Injury: Teeth and Oropharynx as per pre-operative assessment

## 2021-04-27 NOTE — Anesthesia Postprocedure Evaluation (Signed)
Anesthesia Post Note  Patient: [name]  Procedure(s) Performed: CYSTOSCOPY/URETEROSCOPY/HOLMIUM LASER/STENT PLACEMENT (Left)  Patient location during evaluation: PACU Anesthesia Type: General Level of consciousness: awake and alert Pain management: pain level controlled Vital Signs Assessment: post-procedure vital signs reviewed and stable Respiratory status: spontaneous breathing, nonlabored ventilation, respiratory function stable and patient connected to nasal cannula oxygen Cardiovascular status: blood pressure returned to baseline and stable Postop Assessment: no apparent nausea or vomiting Anesthetic complications: no   No notable events documented.   Last Vitals:  Vitals:   04/27/21 1400 04/27/21 1408  BP: (!) 143/68 129/62  Pulse: 80 85  Resp: 16 16  Temp:  (!) 36.1 C  SpO2: 98% 97%    Last Pain:  Vitals:   04/27/21 1408  TempSrc: Temporal  PainSc: 0-No pain                 Martha Clan

## 2021-04-27 NOTE — Op Note (Signed)
Date of procedure: 04/27/21  Preoperative diagnosis:  Left ureteral stone  Postoperative diagnosis:  Same  Procedure: Cystoscopy, left ureteroscopy, laser lithotripsy, left retrograde pyelogram with intraoperative interpretation, left ureteral stent placement  Surgeon: Nickolas Madrid, MD  Anesthesia: General  Complications: None  Intraoperative findings:  Normal cystoscopy, uncomplicated dusting of large left distal ureteral stone and stent placement  EBL: Minimal  Specimens: None  Drains: Left 6 French by 24 cm ureteral stent  Indication: [name] is a 85 y.o. patient with 7 mm left distal ureteral stone and ongoing renal colic.  After reviewing the management options for treatment, they elected to proceed with the above surgical procedure(s). We have discussed the potential benefits and risks of the procedure, side effects of the proposed treatment, the likelihood of the patient achieving the goals of the procedure, and any potential problems that might occur during the procedure or recuperation. Informed consent has been obtained.  Description of procedure:  The patient was taken to the operating room and general anesthesia was induced. SCDs were placed for DVT prophylaxis. The patient was placed in the dorsal lithotomy position, prepped and draped in the usual sterile fashion, and preoperative antibiotics(Ancef) were administered. A preoperative time-out was performed.   A 21 French rigid cystoscope was used to intubate the urethra and thorough cystoscopy was performed.  The bladder was grossly normal, and the ureteral orifices were orthotopic bilaterally.  With the aid of an access catheter I was ultimately able to navigate a sensor wire alongside the large left distal ureteral stone and the wire passed up to the kidney under fluoroscopic vision.  A semirigid ureteroscope advanced easily alongside the wire and identified a large yellow stone in the distal ureter.  The  200 m laser fiber on settings of 0.5 J and 40 Hz was used to methodically dust the stone.  The fragments were all irrigated free from the ureter.  Thorough ureteroscopy showed no other significant fragments or ureteral injury.  A retrograde pyelogram was performed from the proximal ureter and showed no hydronephrosis or filling defects.  A 6 French by 24 cm ureteral stent was fluoroscopically placed with an excellent curl in the renal pelvis, as well as in the bladder.  The bladder was drained, a belladonna suppository was placed, and this concluded our procedure.  Disposition: Stable to PACU  Plan: Schedule stent removal in 1 week  Nickolas Madrid, MD

## 2021-04-30 ENCOUNTER — Encounter: Payer: Self-pay | Admitting: Urology

## 2021-05-03 ENCOUNTER — Other Ambulatory Visit: Payer: Self-pay

## 2021-05-03 ENCOUNTER — Other Ambulatory Visit (INDEPENDENT_AMBULATORY_CARE_PROVIDER_SITE_OTHER): Payer: Medicare Other

## 2021-05-03 ENCOUNTER — Ambulatory Visit (INDEPENDENT_AMBULATORY_CARE_PROVIDER_SITE_OTHER): Payer: Medicare Other | Admitting: Dermatology

## 2021-05-03 DIAGNOSIS — C4491 Basal cell carcinoma of skin, unspecified: Secondary | ICD-10-CM

## 2021-05-03 DIAGNOSIS — L570 Actinic keratosis: Secondary | ICD-10-CM

## 2021-05-03 DIAGNOSIS — L578 Other skin changes due to chronic exposure to nonionizing radiation: Secondary | ICD-10-CM | POA: Diagnosis not present

## 2021-05-03 DIAGNOSIS — E7800 Pure hypercholesterolemia, unspecified: Secondary | ICD-10-CM

## 2021-05-03 DIAGNOSIS — D485 Neoplasm of uncertain behavior of skin: Secondary | ICD-10-CM

## 2021-05-03 DIAGNOSIS — L905 Scar conditions and fibrosis of skin: Secondary | ICD-10-CM | POA: Diagnosis not present

## 2021-05-03 DIAGNOSIS — C4441 Basal cell carcinoma of skin of scalp and neck: Secondary | ICD-10-CM

## 2021-05-03 HISTORY — DX: Basal cell carcinoma of skin, unspecified: C44.91

## 2021-05-03 LAB — LIPID PANEL
Cholesterol: 199 mg/dL (ref 0–200)
HDL: 73.4 mg/dL (ref >39.00)
LDL Cholesterol: 106 mg/dL — ABNORMAL HIGH (ref 0–99)
NonHDL: 125.56
Total CHOL/HDL Ratio: 3
Triglycerides: 100 mg/dL (ref 0.0–149.0)
VLDL: 20 mg/dL (ref 0.0–40.0)

## 2021-05-03 NOTE — Progress Notes (Signed)
   New Patient Visit  Subjective  [name] is a 85 y.o. female who presents for the following: Other (Scaly patch x 1 year at right brow that is tender./Spot behind right ear that is scaly./Scar on each leg that she would just like checked.).  The following portions of the chart were reviewed this encounter and updated as appropriate:   Tobacco  Allergies  Meds  Problems  Med Hx  Surg Hx  Fam Hx     Review of Systems:  No other skin or systemic complaints except as noted in HPI or Assessment and Plan.  Objective  Well appearing patient in no apparent distress; mood and affect are within normal limits.  A focused examination was performed including face, neck, legs. Relevant physical exam findings are noted in the Assessment and Plan.  Right forehead above brow Erythematous thin papules/macules with gritty scale.      Left lat lower leg, right lat lower leg Scar       Right neck postauricular 1.6 x 1.0 cm atrophic plaque      Assessment & Plan   Actinic Damage - chronic, secondary to cumulative UV radiation exposure/sun exposure over time - diffuse scaly erythematous macules with underlying dyspigmentation - Recommend daily broad spectrum sunscreen SPF 30+ to sun-exposed areas, reapply every 2 hours as needed.  - Recommend staying in the shade or wearing long sleeves, sun glasses (UVA+UVB protection) and wide brim hats (4-inch brim around the entire circumference of the hat). - Call for new or changing lesions.  AK (actinic keratosis) Right forehead above brow  Destruction of lesion - Right forehead above brow Complexity: simple   Destruction method: cryotherapy   Informed consent: discussed and consent obtained   Timeout:  patient name, date of birth, surgical site, and procedure verified Lesion destroyed using liquid nitrogen: Yes   Region frozen until ice ball extended beyond lesion: Yes   Outcome: patient tolerated procedure well with no  complications   Post-procedure details: wound care instructions given    Scar Left lat lower leg, right lat lower leg Benign  Neoplasm of uncertain behavior of skin Right neck postauricular  Skin / nail biopsy Type of biopsy: tangential   Informed consent: discussed and consent obtained   Timeout: patient name, date of birth, surgical site, and procedure verified   Procedure prep:  Patient was prepped and draped in usual sterile fashion Prep type:  Isopropyl alcohol Anesthesia: the lesion was anesthetized in a standard fashion   Anesthetic:  1% lidocaine w/ epinephrine 1-100,000 buffered w/ 8.4% NaHCO3 Instrument used: flexible razor blade   Hemostasis achieved with: pressure, aluminum chloride and electrodesiccation   Outcome: patient tolerated procedure well   Post-procedure details: sterile dressing applied and wound care instructions given   Dressing type: bandage and petrolatum    Specimen 1 - Surgical pathology Differential Diagnosis: BCC vs other  Check Margins: No  Will plan MOHs if +BCC  Return in about 6 weeks (around 06/14/2021) for AK follow up.  I, Ashok Cordia, CMA, am acting as scribe for Sarina Ser, MD . Documentation: I have reviewed the above documentation for accuracy and completeness, and I agree with the above.  Sarina Ser, MD

## 2021-05-03 NOTE — Patient Instructions (Signed)
Cryotherapy Aftercare  Wash gently with soap and water everyday.   Apply Vaseline and Band-Aid daily until healed.    Wound Care Instructions  Cleanse wound gently with soap and water once a day then pat dry with clean gauze. Apply a thing coat of Petrolatum (petroleum jelly, "Vaseline") over the wound (unless you have an allergy to this). We recommend that you use a new, sterile tube of Vaseline. Do not pick or remove scabs. Do not remove the yellow or white "healing tissue" from the base of the wound.  Cover the wound with fresh, clean, nonstick gauze and secure with paper tape. You may use Band-Aids in place of gauze and tape if the would is small enough, but would recommend trimming much of the tape off as there is often too much. Sometimes Band-Aids can irritate the skin.  You should call the office for your biopsy report after 1 week if you have not already been contacted.  If you experience any problems, such as abnormal amounts of bleeding, swelling, significant bruising, significant pain, or evidence of infection, please call the office immediately.  FOR ADULT SURGERY PATIENTS: If you need something for pain relief you may take 1 extra strength Tylenol (acetaminophen) AND 2 Ibuprofen (252m each) together every 4 hours as needed for pain. (do not take these if you are allergic to them or if you have a reason you should not take them.) Typically, you may only need pain medication for 1 to 3 days.     If you have any questions or concerns for your doctor, please call our main line at [phone] press option 4 to reach your doctor's medical assistant. If no one answers, please leave a voicemail as directed and we will return your call as soon as possible. Messages left after 4 pm will be answered the following business day.   You may also send [place] message via [place] We typically respond to MyChart messages within 1-2 business days.  For prescription refills, please ask your  pharmacy to contact our office. Our fax number is [phone]  If you have an urgent issue when the clinic is closed that cannot wait until the next business day, you can page your doctor at the number below.    Please note that while we do our best to be available for urgent issues outside of office hours, we are not available 24/7.   If you have an urgent issue and are unable to reach [place] you may choose to seek medical care at your doctor's office, retail clinic, urgent care center, or emergency room.  If you have a medical emergency, please immediately call 911 or go to the emergency department.  Pager Numbers  - Dr. KNehemiah Massed [phone] - Dr. MLaurence Ferrari [phone] - Dr. SNicole Kindred [phone] In the event of inclement weather, please call our main line at [phone] an update on the status of any delays or closures.  Dermatology Medication Tips: Please keep the boxes that topical medications come in in order to help keep track of the instructions about where and how to use these. Pharmacies typically print the medication instructions only on the boxes and not directly on the medication tubes.   If your medication is too expensive, please contact our office at [phone] 4 or send [place] message through [place]   We are unable to tell what your co-pay for medications will be in advance as this is different depending on your insurance coverage.  However, we may be able to find a substitute medication at lower cost or fill out paperwork to get insurance to cover a needed medication.   If a prior authorization is required to get your medication covered by your insurance company, please allow [place] 1-2 business days to complete this process.  Drug prices often vary depending on where the prescription is filled and some pharmacies may offer cheaper prices.  The website www.goodrx.com contains coupons for medications through different pharmacies. The prices here do not  account for what the cost may be with help from insurance (it may be cheaper with your insurance), but the website can give you the price if you did not use any insurance.  - You can print the associated coupon and take it with your prescription to the pharmacy.  - You may also stop by our office during regular business hours and pick up a GoodRx coupon card.  - If you need your prescription sent electronically to a different pharmacy, notify our office through [facility] or by phone at [phone] option 4.

## 2021-05-09 ENCOUNTER — Encounter: Payer: Self-pay | Admitting: Dermatology

## 2021-05-09 ENCOUNTER — Other Ambulatory Visit
Admission: RE | Admit: 2021-05-09 | Discharge: 2021-05-09 | Disposition: A | Discharge status: Home / Self Care | Payer: Medicare Other | Source: Ambulatory Visit | Attending: Internal Medicine | Admitting: Internal Medicine

## 2021-05-09 DIAGNOSIS — R002 Palpitations: Secondary | ICD-10-CM | POA: Diagnosis not present

## 2021-05-09 DIAGNOSIS — G4733 Obstructive sleep apnea (adult) (pediatric): Secondary | ICD-10-CM | POA: Diagnosis not present

## 2021-05-09 DIAGNOSIS — I429 Cardiomyopathy, unspecified: Secondary | ICD-10-CM | POA: Diagnosis not present

## 2021-05-09 DIAGNOSIS — I208 Other forms of angina pectoris: Secondary | ICD-10-CM | POA: Insufficient documentation

## 2021-05-09 DIAGNOSIS — R600 Localized edema: Secondary | ICD-10-CM | POA: Diagnosis not present

## 2021-05-09 DIAGNOSIS — F419 Anxiety disorder, unspecified: Secondary | ICD-10-CM | POA: Diagnosis not present

## 2021-05-09 DIAGNOSIS — R0602 Shortness of breath: Secondary | ICD-10-CM | POA: Diagnosis not present

## 2021-05-09 DIAGNOSIS — Z79899 Other long term (current) drug therapy: Secondary | ICD-10-CM | POA: Diagnosis not present

## 2021-05-09 DIAGNOSIS — G3184 Mild cognitive impairment, so stated: Secondary | ICD-10-CM | POA: Diagnosis not present

## 2021-05-09 DIAGNOSIS — J8410 Pulmonary fibrosis, unspecified: Secondary | ICD-10-CM | POA: Diagnosis not present

## 2021-05-09 DIAGNOSIS — I10 Essential (primary) hypertension: Secondary | ICD-10-CM | POA: Diagnosis not present

## 2021-05-09 DIAGNOSIS — E7800 Pure hypercholesterolemia, unspecified: Secondary | ICD-10-CM | POA: Diagnosis not present

## 2021-05-09 DIAGNOSIS — R053 Chronic cough: Secondary | ICD-10-CM | POA: Diagnosis not present

## 2021-05-09 LAB — BRAIN NATRIURETIC PEPTIDE: B Natriuretic Peptide: 92.7 pg/mL (ref 0.0–100.0)

## 2021-05-10 ENCOUNTER — Ambulatory Visit (INDEPENDENT_AMBULATORY_CARE_PROVIDER_SITE_OTHER): Payer: Medicare Other | Admitting: Urology

## 2021-05-10 ENCOUNTER — Encounter: Payer: Self-pay | Admitting: Urology

## 2021-05-10 ENCOUNTER — Other Ambulatory Visit: Payer: Self-pay

## 2021-05-10 ENCOUNTER — Other Ambulatory Visit: Payer: Self-pay | Admitting: *Deleted

## 2021-05-10 VITALS — BP 147/81 | HR 60 | Ht 60.0 in

## 2021-05-10 DIAGNOSIS — N200 Calculus of kidney: Secondary | ICD-10-CM

## 2021-05-10 DIAGNOSIS — Z466 Encounter for fitting and adjustment of urinary device: Secondary | ICD-10-CM

## 2021-05-10 MED ORDER — SULFAMETHOXAZOLE-TRIMETHOPRIM 800-160 MG PO TABS
1.0000 | ORAL_TABLET | Freq: Once | ORAL | Status: AC
  Administered 2021-05-10: 1 via ORAL

## 2021-05-10 MED ORDER — LIDOCAINE HCL URETHRAL/MUCOSAL 2 % EX GEL
1.0000 "application " | Freq: Once | CUTANEOUS | Status: AC
  Administered 2021-05-10: 1 via URETHRAL

## 2021-05-10 NOTE — Progress Notes (Signed)
Cystoscopy Procedure Note:  Indication: Stent removal s/p 04/28/2019 ureteroscopy for 7 mm left distal ureteral stone  Bactrim given for prophylaxis  After informed consent and discussion of the procedure and its risks, [name] was positioned and prepped in the standard fashion. Cystoscopy was performed with a flexible cystoscope. The stent was grasped with flexible graspers and removed in its entirety. The patient tolerated the procedure well.  Findings: Uncomplicated stent removal  Assessment and Plan: Stone prevention strategies discussed, follow-up with urology as needed  Billey Co, MD 05/10/2021

## 2021-05-16 ENCOUNTER — Telehealth: Payer: Self-pay

## 2021-05-16 DIAGNOSIS — M48061 Spinal stenosis, lumbar region without neurogenic claudication: Secondary | ICD-10-CM | POA: Diagnosis not present

## 2021-05-16 NOTE — Telephone Encounter (Signed)
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

## 2021-05-16 NOTE — Telephone Encounter (Signed)
Error

## 2021-05-16 NOTE — Telephone Encounter (Signed)
Advised patient of results and scheduled surgery appointment/hd

## 2021-05-17 ENCOUNTER — Other Ambulatory Visit: Payer: Self-pay

## 2021-05-17 ENCOUNTER — Ambulatory Visit
Admission: RE | Admit: 2021-05-17 | Discharge: 2021-05-17 | Disposition: A | Discharge status: Home / Self Care | Payer: Medicare Other | Source: Ambulatory Visit | Attending: Pain Medicine | Admitting: Pain Medicine

## 2021-05-17 ENCOUNTER — Encounter: Payer: Self-pay | Admitting: Pain Medicine

## 2021-05-17 ENCOUNTER — Ambulatory Visit (HOSPITAL_BASED_OUTPATIENT_CLINIC_OR_DEPARTMENT_OTHER): Payer: Medicare Other | Admitting: Pain Medicine

## 2021-05-17 VITALS — BP 159/73 | HR 60 | Temp 97.7°F | Resp 16 | Ht 60.0 in | Wt 127.0 lb

## 2021-05-17 DIAGNOSIS — M25512 Pain in left shoulder: Secondary | ICD-10-CM | POA: Diagnosis not present

## 2021-05-17 DIAGNOSIS — G8929 Other chronic pain: Secondary | ICD-10-CM

## 2021-05-17 DIAGNOSIS — M25519 Pain in unspecified shoulder: Secondary | ICD-10-CM

## 2021-05-17 DIAGNOSIS — M778 Other enthesopathies, not elsewhere classified: Secondary | ICD-10-CM

## 2021-05-17 DIAGNOSIS — M7918 Myalgia, other site: Secondary | ICD-10-CM | POA: Diagnosis not present

## 2021-05-17 DIAGNOSIS — M549 Dorsalgia, unspecified: Secondary | ICD-10-CM

## 2021-05-17 DIAGNOSIS — Z96612 Presence of left artificial shoulder joint: Secondary | ICD-10-CM | POA: Insufficient documentation

## 2021-05-17 DIAGNOSIS — Z96619 Presence of unspecified artificial shoulder joint: Secondary | ICD-10-CM | POA: Diagnosis not present

## 2021-05-17 MED ORDER — SODIUM CHLORIDE (PF) 0.9 % IJ SOLN
INTRAMUSCULAR | Status: AC
  Filled 2021-05-17: qty 10

## 2021-05-17 MED ORDER — METHYLPREDNISOLONE ACETATE 80 MG/ML IJ SUSP
80.0000 mg | Freq: Once | INTRAMUSCULAR | Status: AC
  Administered 2021-05-17: 80 mg
  Filled 2021-05-17: qty 1

## 2021-05-17 MED ORDER — LIDOCAINE HCL 2 % IJ SOLN
20.0000 mL | Freq: Once | INTRAMUSCULAR | Status: AC
  Administered 2021-05-17: 100 mg

## 2021-05-17 MED ORDER — ROPIVACAINE HCL 2 MG/ML IJ SOLN
4.0000 mL | Freq: Once | INTRAMUSCULAR | Status: AC
  Administered 2021-05-17: 4 mL via PERINEURAL

## 2021-05-17 MED ORDER — PENTAFLUOROPROP-TETRAFLUOROETH EX AERO
INHALATION_SPRAY | Freq: Once | CUTANEOUS | Status: AC
  Administered 2021-05-17: 30 via TOPICAL
  Filled 2021-05-17: qty 116

## 2021-05-17 NOTE — Progress Notes (Signed)
PROVIDER NOTE: Information contained herein reflects review and annotations entered in association with encounter. Interpretation of such information and data should be left to medically-trained personnel. Information provided to patient can be located elsewhere in the medical record under "Patient Instructions". Document created using STT-dictation technology, any transcriptional errors that may result from process are unintentional.    Patient: [name]  Service Category: Procedure  Provider: Oswaldo Done, MD  DOB: 1936-06-12  DOS: 05/17/2021  Location: [facility]  MRN: [id number]  Setting: Ambulatory - outpatient  Referring Provider: Doreene Nest, NP  Type: Established Patient  Specialty: Interventional Pain Management  PCP: Doreene Nest, NP   Primary Reason for Visit: Interventional Pain Management Treatment. CC: Shoulder Pain (left)    Procedure:          Anesthesia, Analgesia, Anxiolysis:  Type: Diagnostic Suprascapular nerve Block          Primary Purpose: Diagnostic Region: Posterior Shoulder & Scapular Areas Level: Superior to the scapular spine, in the lateral aspect of the supraspinatus fossa (Suprescapular notch). Target Area: Suprascapular nerve as it passes thru the lower portion of the suprascapular notch. Approach: Posterior percutaneous approach. Laterality: Left-Side  Type: Local Anesthesia Local Anesthetic: Lidocaine 1-2% Sedation: None  Indication(s):  Analgesia Route: Infiltration ([place]/IM) IV Access: N/A   Position: Prone   Indications: 1. Pain in shoulder region after shoulder replacement (Left)   2. Chronic shoulder pain (Left)   3. Chronic upper back pain   4. Myofascial pain syndrome of thoracic spine   5. Tendinitis of triceps (Left)   6. Trigger point of shoulder region (Left)   7. History of total shoulder replacement (Left)   8. History of arthroplasty of shoulder (Left)    Pain Score: Pre-procedure: 6  /10 Post-procedure: 0-No pain/10    The patient indicates that she has been experiencing some more pain in the lower lumbar region and she may be interested in having the lumbar procedures repeated.  Pre-op H&P Assessment:  [name] is a 85 y.o. (year old), female patient, seen today for interventional treatment. She  has a past surgical history that includes Rotator cuff repair (Bilateral); Total shoulder replacement (Left); Cataract extraction w/PHACO (Left, 04/24/2016); Cataract extraction w/PHACO (Right, 07/14/2017); Esophagogastroduodenoscopy (egd) with propofol (N/A, 10/13/2018); Flexible sigmoidoscopy (N/A, 10/13/2018); Cholecystectomy; Tonsillectomy; Back surgery; Flexible sigmoidoscopy (N/A, 09/09/2019); Flexible sigmoidoscopy (N/A, 08/04/2020); Hip Arthroplasty (Right, 09/27/2020); and Cystoscopy/ureteroscopy/holmium laser/stent placement (Left, 04/27/2021). [name] has a current medication list which includes the following prescription(s): atorvastatin, budesonide, calcium carbonate, vitamin d3, citalopram, denosumab, esbriet, hydrocodone-acetaminophen, magnesium oxide, metoprolol succinate, oxygen-helium, sulfasalazine, vitamin b-12, voltaren, calcium carbonate-vitamin d, and hydrocodone bit-homatropine. Her primarily concern today is the Shoulder Pain (left)  Initial Vital Signs:  Pulse/HCG Rate: 60ECG Heart Rate: (!) 54 Temp: 97.7 F (36.5 C) Resp: 16 BP: (!) 120/50 SpO2: 94 %  BMI: Estimated body mass index is 24.8 kg/m as calculated from the following:   Height as of this encounter: 5' (1.524 m).   Weight as of this encounter: 127 lb (57.6 kg).  Risk Assessment: Allergies: Reviewed. She has No Known Allergies.  Allergy Precautions: None required Coagulopathies: Reviewed. None identified.  Blood-thinner therapy: None at this time Active Infection(s): Reviewed. None identified. [name] is afebrile  Site Confirmation: [name] was asked to confirm the procedure and  laterality before marking the site Procedure checklist: Completed Consent: Before the procedure and under the influence of no sedative(s), amnesic(s), or anxiolytics, the patient was informed  of the treatment options, risks and possible complications. To fulfill our ethical and legal obligations, as recommended by the American Medical Association's Code of Ethics, I have informed the patient of my clinical impression; the nature and purpose of the treatment or procedure; the risks, benefits, and possible complications of the intervention; the alternatives, including doing nothing; the risk(s) and benefit(s) of the alternative treatment(s) or procedure(s); and the risk(s) and benefit(s) of doing nothing. The patient was provided information about the general risks and possible complications associated with the procedure. These may include, but are not limited to: failure to achieve desired goals, infection, bleeding, organ or nerve damage, allergic reactions, paralysis, and death. In addition, the patient was informed of those risks and complications associated to the procedure, such as failure to decrease pain; infection; bleeding; organ or nerve damage with subsequent damage to sensory, motor, and/or autonomic systems, resulting in permanent pain, numbness, and/or weakness of one or several areas of the body; allergic reactions; (i.e.: anaphylactic reaction); and/or death. Furthermore, the patient was informed of those risks and complications associated with the medications. These include, but are not limited to: allergic reactions (i.e.: anaphylactic or anaphylactoid reaction(s)); adrenal axis suppression; blood sugar elevation that in diabetics may result in ketoacidosis or comma; water retention that in patients with history of congestive heart failure may result in shortness of breath, pulmonary edema, and decompensation with resultant heart failure; weight gain; swelling or edema; medication-induced  neural toxicity; particulate matter embolism and blood vessel occlusion with resultant organ, and/or nervous system infarction; and/or aseptic necrosis of one or more joints. Finally, the patient was informed that Medicine is not an exact science; therefore, there is also the possibility of unforeseen or unpredictable risks and/or possible complications that may result in a catastrophic outcome. The patient indicated having understood very clearly. We have given the patient no guarantees and we have made no promises. Enough time was given to the patient to ask questions, all of which were answered to the patient's satisfaction. Ms. Oestreicher has indicated that she wanted to continue with the procedure. Attestation: I, the ordering provider, attest that I have discussed with the patient the benefits, risks, side-effects, alternatives, likelihood of achieving goals, and potential problems during recovery for the procedure that I have provided informed consent. Date  Time: 05/17/2021 10:22 AM  Pre-Procedure Preparation:  Monitoring: As per clinic protocol. Respiration, ETCO2, SpO2, BP, heart rate and rhythm monitor placed and checked for adequate function Safety Precautions: Patient was assessed for positional comfort and pressure points before starting the procedure. Time-out: I initiated and conducted the "Time-out" before starting the procedure, as per protocol. The patient was asked to participate by confirming the accuracy of the "Time Out" information. Verification of the correct person, site, and procedure were performed and confirmed by me, the nursing staff, and the patient. "Time-out" conducted as per Joint Commission's Universal Protocol (UP.01.01.01). Time: 1148  Description of Procedure:          Area Prepped: Entire shoulder Area DuraPrep (Iodine Povacrylex [0.7% available iodine] and Isopropyl Alcohol, 74% w/w) Safety Precautions: Aspiration looking for blood return was conducted prior to  all injections. At no point did we inject any substances, as a needle was being advanced. No attempts were made at seeking any paresthesias. Safe injection practices and needle disposal techniques used. Medications properly checked for expiration dates. SDV (single dose vial) medications used. Description of the Procedure: Protocol guidelines were followed. The patient was placed in position over the procedure table.  The target area was identified and the area prepped in the usual manner. Skin & deeper tissues infiltrated with local anesthetic. Appropriate amount of time allowed to pass for local anesthetics to take effect. The procedure needles were then advanced to the target area. Proper needle placement secured. Negative aspiration confirmed. Solution injected in intermittent fashion, asking for systemic symptoms every 0.5cc of injectate. The needles were then removed and the area cleansed, making sure to leave some of the prepping solution back to take advantage of its long term bactericidal properties.  Vitals:   05/17/21 1020 05/17/21 1143 05/17/21 1150 05/17/21 1153  BP: (!) 120/50 (!) 151/61 135/84 (!) 159/73  Pulse: 60     Resp: 16 18 18 16   Temp: 97.7 F (36.5 C)     TempSrc: Temporal     SpO2: 94% 94% 97% 95%  Weight: 127 lb (57.6 kg)     Height: 5' (1.524 m)       Start Time: 1148 hrs. End Time: 1152 hrs. Materials:  Needle(s) Type: Spinal Needle Gauge: 22G Length: 3.5-in Medication(s): Please see orders for medications and dosing details.  Imaging Guidance (Non-Spinal):          Type of Imaging Technique: Fluoroscopy Guidance (Non-Spinal) Indication(s): Assistance in needle guidance and placement for procedures requiring needle placement in or near specific anatomical locations not easily accessible without such assistance. Exposure Time: Please see nurses notes. Contrast: Before injecting any contrast, we confirmed that the patient did not have an allergy to iodine,  shellfish, or radiological contrast. Once satisfactory needle placement was completed at the desired level, radiological contrast was injected. Contrast injected under live fluoroscopy. No contrast complications. See chart for type and volume of contrast used. Fluoroscopic Guidance: I was personally present during the use of fluoroscopy. "Tunnel Vision Technique" used to obtain the best possible view of the target area. Parallax error corrected before commencing the procedure. "Direction-depth-direction" technique used to introduce the needle under continuous pulsed fluoroscopy. Once target was reached, antero-posterior, oblique, and lateral fluoroscopic projection used confirm needle placement in all planes. Images permanently stored in EMR. Interpretation: I personally interpreted the imaging intraoperatively. Adequate needle placement confirmed in multiple planes. Appropriate spread of contrast into desired area was observed. No evidence of afferent or efferent intravascular uptake. Permanent images saved into the patient's record.  Antibiotic Prophylaxis:   Anti-infectives (From admission, onward)    None      Indication(s): None identified  Post-operative Assessment:  Post-procedure Vital Signs:  Pulse/HCG Rate: 6060 Temp: 97.7 F (36.5 C) Resp: 16 BP: (!) 159/73 SpO2: 95 %  EBL: None  Complications: No immediate post-treatment complications observed by team, or reported by patient.  Note: The patient tolerated the entire procedure well. A repeat set of vitals were taken after the procedure and the patient was kept under observation following institutional policy, for this type of procedure. Post-procedural neurological assessment was performed, showing return to baseline, prior to discharge. The patient was provided with post-procedure discharge instructions, including a section on how to identify potential problems. Should any problems arise concerning this procedure, the patient  was given instructions to immediately contact us, at any time, without hesitation. In any case, we plan to contact the patient by telephone for a follow-up status report regarding this interventional procedure.  Comments:  No additional relevant information.  Plan of Care  Orders:  Orders Placed This Encounter  Procedures   SUPRASCAPULAR NERVE BLOCK    For shoulder pain.    Scheduling  Instructions:     Purpose: Diagnostic     Laterality: Left-sided     Level(s): Suprascapular notch     Sedation: No Sedation.     Scheduling Timeframe: Today    Order Specific Question:   Where will this procedure be performed?    Answer:   [facility]   DG PAIN [facility] C-ARM 1-60 MIN NO REPORT    Intraoperative interpretation by procedural physician at [facility].    Standing Status:   Standing    Number of Occurrences:   1    Order Specific Question:   Reason for exam:    Answer:   Assistance in needle guidance and placement for procedures requiring needle placement in or near specific anatomical locations not easily accessible without such assistance.   Informed Consent Details: Physician/Practitioner Attestation; Transcribe to consent form and obtain patient signature    Note: Always confirm laterality of pain with Ms. Janee Morn, before procedure.    Order Specific Question:   Physician/Practitioner attestation of informed consent for procedure/surgical case    Answer:   I, the physician/practitioner, attest that I have discussed with the patient the benefits, risks, side effects, alternatives, likelihood of achieving goals and potential problems during recovery for the procedure that I have provided informed consent.    Order Specific Question:   Procedure    Answer:   Suprascapular Nerve Block    Order Specific Question:   Physician/Practitioner performing the procedure    Answer:   Zula Hovsepian A. Laban Emperor, MD    Order Specific Question:   Indication/Reason    Answer:   Chronic  shoulder pain (arthralgia)   Provide equipment / supplies at bedside    "Block Tray" (Disposable  single use) Needle type: SpinalSpinal Amount/quantity: 1 Size: Regular (3.5-inch) Gauge: 22G    Standing Status:   Standing    Number of Occurrences:   1    Order Specific Question:   Specify    Answer:   Block Tray    Chronic Opioid Analgesic:  No chronic opioid analgesics therapy prescribed by our practice. Hydrocodone/APAP 7.5/325, 1 tab p.o. 4 times daily (30 mg/day of hydrocodone) (filled on 02/07/2021) (22.5 MME) (prescribed by Melany Guernsey, ANP.) MME/day: 30 mg/day   Medications ordered for procedure: Meds ordered this encounter  Medications   lidocaine (XYLOCAINE) 2 % (with pres) injection 400 mg   pentafluoroprop-tetrafluoroeth (GEBAUERS) aerosol   ropivacaine (PF) 2 mg/mL (0.2%) (NAROPIN) injection 4 mL   methylPREDNISolone acetate (DEPO-MEDROL) injection 80 mg    Medications administered: We administered lidocaine, pentafluoroprop-tetrafluoroeth, ropivacaine (PF) 2 mg/mL (0.2%), and methylPREDNISolone acetate.  See the medical record for exact dosing, route, and time of administration.  Follow-up plan:   Return in about 2 weeks (around 05/31/2021) for Proc-day (T,Th), (F2F), (PPE).       Interventional Therapies  Risk  Complexity Considerations:   Estimated body mass index is 24.8 kg/m as calculated from the following:   Height as of this encounter: 5' (1.524 m).   Weight as of this encounter: 127 lb (57.6 kg). WNL   Planned  Pending:   Pending further evaluation   Under consideration:   Diagnostic right L2 and L3 transforaminal ESI #1  Diagnostic left lumbar facet block #1  Diagnostic left L4-5 LESI #1  Diagnostic left suprascapular NB #1  Possible left suprascapular nerve RFA #1  Diagnostic right IA glenohumeral and AC joint injection #1    Completed:   Diagnostic bilateral lumbar facet block x1 (  01/18/2021) (5-0) (0/0/0/0)   Diagnostic/therapeutic bilateral L3 TFESI x1 (02/13/2021)  Diagnostic/therapeutic left L4-5 LESI x1 (02/13/2021)    Therapeutic  Palliative (PRN) options:   None established       Recent Visits Date Type Provider Dept  05/17/21 Procedure visit Delano Metz, MD [facility]  04/24/21 Office Visit Delano Metz, MD [facility]  03/20/21 Procedure visit Delano Metz, MD [facility]  02/27/21 Office Visit Delano Metz, MD [facility]  Showing recent visits within past 90 days and meeting all other requirements Future Appointments Date Type Provider Dept  06/05/21 Appointment Delano Metz, MD [facility]  Showing future appointments within next 90 days and meeting all other requirements Disposition: Discharge home  Discharge (Date  Time): 05/17/2021; 1205 hrs.   Primary Care Physician: Doreene Nest, NP Location: [facility] Note by: Oswaldo Done, MD Date: 05/17/2021; Time: 10:41 AM  Disclaimer:  Medicine is not an Visual merchandiser. The only guarantee in medicine is that nothing is guaranteed. It is important to note that the decision to proceed with this intervention was based on the information collected from the patient. The Data and conclusions were drawn from the patient's questionnaire, the interview, and the physical examination. Because the information was provided in large part by the patient, it cannot be guaranteed that it has not been purposely or unconsciously manipulated. Every effort has been made to obtain as much relevant data as possible for this evaluation. It is important to note that the conclusions that lead to this procedure are derived in large part from the available data. Always take into account that the treatment will also be dependent on availability of resources and existing treatment guidelines, considered by other Pain Management Practitioners as  being common knowledge and practice, at the time of the intervention. For Medico-Legal purposes, it is also important to point out that variation in procedural techniques and pharmacological choices are the acceptable norm. The indications, contraindications, technique, and results of the above procedure should only be interpreted and judged by a Board-Certified Interventional Pain Specialist with extensive familiarity and expertise in the same exact procedure and technique.

## 2021-05-17 NOTE — Patient Instructions (Signed)

## 2021-05-18 ENCOUNTER — Telehealth: Payer: Self-pay

## 2021-05-18 NOTE — Telephone Encounter (Signed)
Post procedure phone call.  LM 

## 2021-05-21 DIAGNOSIS — M818 Other osteoporosis without current pathological fracture: Secondary | ICD-10-CM | POA: Diagnosis not present

## 2021-05-22 DIAGNOSIS — Z01818 Encounter for other preprocedural examination: Secondary | ICD-10-CM | POA: Diagnosis not present

## 2021-05-22 DIAGNOSIS — J8410 Pulmonary fibrosis, unspecified: Secondary | ICD-10-CM | POA: Diagnosis not present

## 2021-05-22 DIAGNOSIS — R053 Chronic cough: Secondary | ICD-10-CM | POA: Diagnosis not present

## 2021-05-22 DIAGNOSIS — Z9981 Dependence on supplemental oxygen: Secondary | ICD-10-CM | POA: Diagnosis not present

## 2021-05-28 ENCOUNTER — Telehealth: Payer: Self-pay

## 2021-05-28 NOTE — Telephone Encounter (Signed)
Pt called and left vm with questions about upcoming surgery appt on 06/12/21. Tried to call pt but vm box has not been set up. Not able to leave a message.

## 2021-06-04 DIAGNOSIS — R0602 Shortness of breath: Secondary | ICD-10-CM | POA: Diagnosis not present

## 2021-06-04 DIAGNOSIS — I429 Cardiomyopathy, unspecified: Secondary | ICD-10-CM | POA: Diagnosis not present

## 2021-06-04 DIAGNOSIS — I208 Other forms of angina pectoris: Secondary | ICD-10-CM | POA: Diagnosis not present

## 2021-06-04 NOTE — Progress Notes (Signed)
PROVIDER NOTE: Information contained herein reflects review and annotations entered in association with encounter. Interpretation of such information and data should be left to medically-trained personnel. Information provided to patient can be located elsewhere in the medical record under "Patient Instructions". Document created using STT-dictation technology, any transcriptional errors that may result from process are unintentional.    Patient: [name]  Service Category: E/M  Provider: Gaspar Cola, MD  DOB: [birth year]  DOS: 06/05/2021  Specialty: Interventional Pain Management  MRN: [id number]  Setting: Ambulatory outpatient  PCP: [name], NP  Type: Established Patient    Referring Provider: [name], NP  Location: Office  Delivery: Face-to-face     HPI  [name], a 85 y.o. year old female, is here today because of her Chronic bilateral low back pain without sciatica [M54.50, G89.29]. [name] primary complain today is Back Pain (Lower/) Last encounter: My last encounter with her was on 05/17/2021. Pertinent problems: [name] has Arthritis; Compression fracture of L4 lumbar vertebra, sequela; Osteopetrosis; Subcapital fracture of femur, sequela (Right); Chronic pain syndrome; Chronic groin pain (2ry area of Pain) (Right); Chronic low back pain (1ry area of Pain) (Bilateral) (L>R) w/o sciatica; Chronic hip pain after total replacement (Right); Chronic shoulder pain (3ry area of Pain) (Bilateral) (L>R); Abnormal MRI, lumbar spine (12/06/2020); T12 compression fracture, sequela; Compression fracture of L1 lumbar vertebra, sequela; Compression fracture of L3 lumbar vertebra, sequela; Lumbosacral facet arthropathy (Multilevel) (Bilateral); Lumbar facet syndrome; Spondylosis without myelopathy or radiculopathy, lumbosacral region; DDD (degenerative disc disease), lumbosacral; DDD (degenerative disc disease), thoracolumbar; Chronic radicular pain of  lower back; Lumbar spondylosis; Osteoporosis with current pathological fracture, sequela; Osteoporosis with pathological fracture of lumbar vertebra ([place]); Trigger point of shoulder region (Left); Tendinitis of triceps (Left); Chronic upper back pain; Chronic thoracic back pain (Left); Trigger point with back pain; Lower extremity weakness (Bilateral); Myofascial pain syndrome of lumbar spine; Myofascial pain syndrome of thoracic spine; Cervicalgia; Chronic shoulder pain (Left); History of total shoulder replacement (Left); Pain in shoulder region after shoulder replacement (Left); and History of arthroplasty of shoulder (Left) on their pertinent problem list. Pain Assessment: Severity of Chronic pain is reported as a 6 /10. Location: Back Right, Left/Denies. Onset: More than a month ago. Quality: Aching, Nagging. Timing: Constant. Modifying factor(s): Meds. Vitals:  height is 5' (1.524 m) and weight is 126 lb (57.2 kg). Her temporal temperature is 97.1 F (36.2 C) (abnormal). Her blood pressure is 105/62 and her pulse is 67. Her respiration is 16 and oxygen saturation is 99%.   Reason for encounter: post-procedure assessment.  [name] indicates not having had any long-term benefit from the injection.  Today again I had to go over how the diagnostic injections work, what the local anesthetics and steroids do, and what we expect for those injections.  Unfortunately, she has really bad memory and on top of that she tends to overthink the pain diary.  To make a long story short, currently she is still having pain in her shoulders with decreased range of motion, and she did have some relief from the injection, but according to her the effects of the local anesthetic did not last 4 to 8 hours.  However, she is not sure because she cannot remember.  An example of the patient's difficulty evaluating this procedures would be that of the initial diagnostic bilateral lumbar facet blocks on 01/18/2021.  Immediately  after the procedure the patient indicated that her pain had gone from  a 5/10 to a 0/10.  2 weeks later, on follow-up, she indicated having had 0 relief of the pain for the first hour, 0 relief for the next 4 to 6 hours, 0 extended relief, and 0 benefit.  However since that procedure the patient has not had any more pain on the right side of the lower spine meaning that she attained 100% relief of the right lower back pain on 01/18/2021 that seems to still be going on as of 06/05/2021.  After having gone over this multiple times and having explained this in layman's terms, we decided to move onto what is currently her worst pain.  She indicates that she is having pain in the left lower back without any lower extremity pain.  She also denies any pain in her hip.  Physical exam today shows the patient to have reproduction of her pain on hyperextension on rotation and Kemp maneuver towards the left side.  Patrick maneuver did cause some sacroiliac joint discomfort on the right side but none on the left.  On the other hand, it did not give her any hip pain on the right, but it did cause some hip arthralgia on the left but the patient indicated that its not the same type of pain that she has been experiencing.  Based on the findings, I have decided to bring the patient in for a diagnostic left-sided lumbar facet block under fluoroscopic guidance and minimal anxiolysis.  I have explained the procedure to the patient who indicated having understood and accepted.  Post-Procedure Evaluation  Procedure (05/17/2021):  Procedure:           Anesthesia, Analgesia, Anxiolysis:  Type: Diagnostic Suprascapular nerve Block          Primary Purpose: Diagnostic Region: Posterior Shoulder & Scapular Areas Level: Superior to the scapular spine, in the lateral aspect of the supraspinatus fossa (Suprescapular notch). Target Area: Suprascapular nerve as it passes thru the lower portion of the suprascapular notch. Approach: Posterior  percutaneous approach. Laterality: Left-Side   Type: Local Anesthesia Local Anesthetic: Lidocaine 1-2% Sedation: None  Indication(s):  Analgesia Route: Infiltration ([place]/IM) IV Access: N/A     Position: Prone    Indications: 1. Pain in shoulder region after shoulder replacement (Left)   2. Chronic shoulder pain (Left)   3. Chronic upper back pain   4. Myofascial pain syndrome of thoracic spine   5. Tendinitis of triceps (Left)   6. Trigger point of shoulder region (Left)   7. History of total shoulder replacement (Left)   8. History of arthroplasty of shoulder (Left)     Pain Score: Pre-procedure: 6 /10 Post-procedure: 0-No pain/10   Anxiolysis: none.  Effectiveness during initial hour after procedure (Ultra-Short Term Relief): 50 %.  Local anesthetic used: Long-acting (4-6 hours) Effectiveness: Defined as any analgesic benefit obtained secondary to the administration of local anesthetics. This carries significant diagnostic value as to the etiological location, or anatomical origin, of the pain. Duration of benefit is expected to coincide with the duration of the local anesthetic used.  Effectiveness during initial 4-6 hours after procedure (Short-Term Relief): 10 %.  Long-term benefit: Defined as any relief past the pharmacologic duration of the local anesthetics.  Effectiveness past the initial 6 hours after procedure (Long-Term Relief): 10 %.  Benefits, current: Defined as benefit present at the time of this evaluation.   Analgesia: The patient refers not having had any significant benefit from the diagnostic left-sided suprascapular nerve block.  However, her pain has  changed to the lower back being her worst pain when prior to the shoulder injection, the left shoulder was her primary complaint.  She admits her memory to be really bad and although she technically completed her pain diary, she was unable to provide [place] with details regarding the pain and pain relief since she  could not really interpret that information back from her own notes. Function: Somewhat improved ROM: Somewhat improved   Pharmacotherapy Assessment  Analgesic: No chronic opioid analgesics therapy prescribed by our practice. Hydrocodone/APAP 7.5/325, 1 tab p.o. 4 times daily (30 mg/day of hydrocodone) (filled on 02/07/2021) (22.5 MME) (prescribed by Danise Mina, ANP.) MME/day: 30 mg/day   Monitoring: [place] PMP: PDMP reviewed during this encounter.       Pharmacotherapy: No side-effects or adverse reactions reported. Compliance: No problems identified. Effectiveness: Clinically acceptable.  Chauncey Fischer, RN  06/05/2021  2:33 PM  Sign when Signing Visit Safety precautions to be maintained throughout the outpatient stay will include: orient to surroundings, keep bed in low position, maintain call bell within reach at all times, provide assistance with transfer out of bed and ambulation.      UDS:  Summary  Date Value Ref Range Status  01/16/2021 Note  Final    Comment:    ==================================================================== Compliance Drug Analysis, Ur ==================================================================== Test                             Result       Flag       Units  Drug Present and Declared for Prescription Verification   Hydrocodone                    1275         EXPECTED   ng/mg creat   Dihydrocodeine                 287          EXPECTED   ng/mg creat   Norhydrocodone                 >3448        EXPECTED   ng/mg creat    Sources of hydrocodone include scheduled prescription medications.    Dihydrocodeine and norhydrocodone are expected metabolites of    hydrocodone. Dihydrocodeine is also available as a scheduled    prescription medication.    Citalopram                     PRESENT      EXPECTED   Desmethylcitalopram            PRESENT      EXPECTED    Desmethylcitalopram is an expected metabolite of citalopram or the     enantiomeric form, escitalopram.    Acetaminophen                  PRESENT      EXPECTED  Drug Present not Declared for Prescription Verification   Salicylate                     PRESENT      UNEXPECTED  Drug Absent but Declared for Prescription Verification   Diclofenac                     Not Detected UNEXPECTED    Diclofenac, as indicated in the declared medication list, is  not    always detected even when used as directed.  ==================================================================== Test                      Result    Flag   Units      Ref Range   Creatinine              145              mg/dL      >=20 ==================================================================== Declared Medications:  The flagging and interpretation on this report are based on the  following declared medications.  Unexpected results may arise from  inaccuracies in the declared medications.   **Note: The testing scope of this panel includes these medications:   Citalopram (Celexa)  Hydrocodone (Norco)   **Note: The testing scope of this panel does not include small to  moderate amounts of these reported medications:   Acetaminophen (Norco)  Diclofenac (Voltaren)   **Note: The testing scope of this panel does not include the  following reported medications:   Cholecalciferol  Cyanocobalamin  Denosumab (Prolia)  Helium  Oxygen  Pirfenidone (Esbriet)  Sulfasalazine ==================================================================== For clinical consultation, please call [phone]. ====================================================================      ROS  Constitutional: Denies any fever or chills Gastrointestinal: No reported hemesis, hematochezia, vomiting, or acute GI distress Musculoskeletal: Denies any acute onset joint swelling, redness, loss of ROM, or weakness Neurological: No reported episodes of acute onset apraxia, aphasia, dysarthria, agnosia, amnesia,  paralysis, loss of coordination, or loss of consciousness  Medication Review  Calcium Carbonate-Vitamin D, HYDROcodone bit-homatropine, HYDROcodone-acetaminophen, Magnesium Oxide, Oxygen-Helium, Pirfenidone, Vitamin D3, atorvastatin, budesonide, calcium carbonate, citalopram, denosumab, diclofenac Sodium, metoprolol succinate, sulfaSALAzine, and vitamin B-12  History Review  Allergy: [name] has No Known Allergies. Drug: [name]  reports no history of drug use. Alcohol:  reports no history of alcohol use. Tobacco:  reports that she has never smoked. She has never used smokeless tobacco. Social: [name]  reports that she has never smoked. She has never used smokeless tobacco. She reports that she does not drink alcohol and does not use drugs. 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 Surgical: [name]  has a past surgical history that includes Rotator cuff repair (Bilateral); Total shoulder replacement (Left); Cataract extraction w/PHACO (Left, 04/24/2016); Cataract extraction w/PHACO (Right, 07/14/2017); Esophagogastroduodenoscopy (egd) with propofol (N/A, 10/13/2018); Flexible sigmoidoscopy (N/A, 10/13/2018); Cholecystectomy; Tonsillectomy; Back surgery; Flexible sigmoidoscopy (N/A, 09/09/2019); Flexible sigmoidoscopy (N/A, 08/04/2020); Hip Arthroplasty (Right,  09/27/2020); and Cystoscopy/ureteroscopy/holmium laser/stent placement (Left, 04/27/2021). Family: family history includes COPD in her mother; Cancer in her brother.  Laboratory Chemistry Profile   Renal Lab Results  Component Value Date   BUN 24 (H) 04/21/2021   CREATININE 0.73 04/21/2021   BCR 23 09/21/2020   GFR 75.35 10/24/2020   GFRAA 76 09/21/2020   GFRNONAA >60 04/21/2021    Hepatic Lab Results  Component Value Date   AST 36 04/21/2021   ALT 25 04/21/2021   ALBUMIN 3.8 04/21/2021   ALKPHOS 69 04/21/2021   LIPASE 35  04/21/2021    Electrolytes Lab Results  Component Value Date   NA 139 04/21/2021   K 4.4 04/21/2021   CL 106 04/21/2021   CALCIUM 9.2 04/21/2021   MG 2.1 09/29/2020   PHOS 3.3 05/28/2020    Bone Lab Results  Component Value Date   VD25OH 37.79 10/24/2020   25OHVITD1 42 01/03/2021   25OHVITD2 <1.0 01/03/2021   25OHVITD3 42 01/03/2021    Inflammation (CRP: Acute Phase) (ESR: Chronic Phase) Lab Results  Component Value Date   CRP <1 01/03/2021   ESRSEDRATE 13 01/03/2021         Note: Above Lab results reviewed.  Recent Imaging Review  DG PAIN CLINIC C-ARM 1-60 MIN NO REPORT Fluoro was used, but no Radiologist interpretation will be provided.  Please refer to "NOTES" tab for provider progress note. Note: Reviewed        Physical Exam  General appearance: Well nourished, well developed, and well hydrated. In no apparent acute distress Mental status: Alert, oriented x 3 (person, place, & time)       Respiratory: No evidence of acute respiratory distress Eyes: PERLA Vitals: BP 105/62 (BP Location: Right Arm, Patient Position: Sitting, Cuff Size: Normal)   Pulse 67   Temp (!) 97.1 F (36.2 C) (Temporal)   Resp 16   Ht 5' (1.524 m)   Wt 126 lb (57.2 kg)   SpO2 99%   BMI 24.61 kg/m  BMI: Estimated body mass index is 24.61 kg/m as calculated from the following:   Height as of this encounter: 5' (1.524 m).   Weight as of this encounter:  126 lb (57.2 kg). Ideal: Ideal body weight: 45.5 kg (100 lb 4.9 oz) Adjusted ideal body weight: 50.2 kg (110 lb 9.4 oz)  Assessment   Status Diagnosis  Controlled Controlled Controlled 1. Chronic low back pain (1ry area of Pain) (Bilateral) (L>R) w/o sciatica   2. Lumbar facet syndrome   3. DDD (degenerative disc disease), lumbosacral      Updated Problems: No problems updated.  Plan of Care  Problem-specific:  No problem-specific Assessment & Plan notes found for this encounter.  [name] has a current medication list which includes the following long-term medication(s): atorvastatin, calcium carbonate, citalopram, metoprolol succinate, and sulfasalazine.  Pharmacotherapy (Medications Ordered): No orders of the defined types were placed in this encounter.  Orders:  Orders Placed This Encounter  Procedures   LUMBAR FACET(MEDIAL BRANCH NERVE BLOCK) MBNB    Standing Status:   Future    Standing Expiration Date:   09/05/2021    Scheduling Instructions:     Procedure: Lumbar facet block (AKA.: Lumbosacral medial branch nerve block)     Side: Left-sided     Level: L3-4, L4-5, & L5-S1 Facets (L2, L3, L4, L5, & S1 Medial Branch Nerves)     Sedation: Patient's choice.     Timeframe: ASAA    Order Specific Question:   Where will this procedure be performed?    Answer:   [facility]    Follow-up plan:   Return for (Clinic) procedure: (L) L-FCT BLK.     Interventional Therapies  Risk  Complexity Considerations:   Estimated body mass index is 24.8 kg/m as calculated from the following:   Height as of this encounter: 5' (1.524 m).   Weight as of this encounter: 127 lb (57.6 kg). WNL   Planned  Pending:   Diagnostic left lumbar facet block #2    Under consideration:  Diagnostic right L2 and L3 transforaminal ESI #1  Diagnostic left lumbar facet block #1  Diagnostic left L4-5 LESI #1  Diagnostic left suprascapular NB #1  Possible left suprascapular  nerve RFA #1  Diagnostic right IA glenohumeral and AC joint injection #1    Completed:   Diagnostic bilateral lumbar facet block x1 (01/18/2021) (5-0) (0/0/0/0)  Diagnostic/therapeutic bilateral L3 TFESI x1 (02/13/2021)  Diagnostic/therapeutic left L4-5 LESI x1 (02/13/2021)  Diagnostic/therapeutic left suprascapular NB x1 (05/17/2021) (100/50/10/0)   Therapeutic  Palliative (PRN) options:   None established        Recent Visits Date Type Provider Dept  05/17/21 Procedure visit Milinda Pointer, MD [facility]  04/24/21 Office Visit Milinda Pointer, MD [facility]  03/20/21 Procedure visit Milinda Pointer, MD [facility]  Showing recent visits within past 90 days and meeting all other requirements Today's Visits Date Type Provider Dept  06/05/21 Office Visit Milinda Pointer, MD [facility]  Showing today's visits and meeting all other requirements Future Appointments Date Type Provider Dept  06/19/21 Appointment Milinda Pointer, MD [facility]  Showing future appointments within next 90 days and meeting all other requirements I discussed the assessment and treatment plan with the patient. The patient was provided an opportunity to ask questions and all were answered. The patient agreed with the plan and demonstrated an understanding of the instructions.  Patient advised to call back or seek an in-person evaluation if the symptoms or condition worsens.  Duration of encounter: 39 minutes.  Note by: Gaspar Cola, MD Date: 06/05/2021; Time: 4:06 PM

## 2021-06-05 ENCOUNTER — Other Ambulatory Visit: Payer: Self-pay

## 2021-06-05 ENCOUNTER — Encounter: Payer: Self-pay | Admitting: Pain Medicine

## 2021-06-05 ENCOUNTER — Ambulatory Visit: Payer: Medicare Other | Attending: Pain Medicine | Admitting: Pain Medicine

## 2021-06-05 VITALS — BP 105/62 | HR 67 | Temp 97.1°F | Resp 16 | Ht 60.0 in | Wt 126.0 lb

## 2021-06-05 DIAGNOSIS — G8929 Other chronic pain: Secondary | ICD-10-CM | POA: Insufficient documentation

## 2021-06-05 DIAGNOSIS — M545 Low back pain, unspecified: Secondary | ICD-10-CM | POA: Insufficient documentation

## 2021-06-05 DIAGNOSIS — M5137 Other intervertebral disc degeneration, lumbosacral region: Secondary | ICD-10-CM | POA: Diagnosis not present

## 2021-06-05 DIAGNOSIS — M47816 Spondylosis without myelopathy or radiculopathy, lumbar region: Secondary | ICD-10-CM | POA: Insufficient documentation

## 2021-06-05 NOTE — Progress Notes (Signed)
Safety precautions to be maintained throughout the outpatient stay will include: orient to surroundings, keep bed in low position, maintain call bell within reach at all times, provide assistance with transfer out of bed and ambulation.  

## 2021-06-05 NOTE — Patient Instructions (Signed)
______________________________________________________________________  Preparing for Procedure with Sedation  NOTICE: Due to recent regulatory changes, starting on April 02, 2021, procedures requiring intravenous (IV) sedation will no longer be performed at the [facility].  These types of procedures are required to be performed at [facility] ambulatory surgery facility.  We are very sorry for the inconvenience.  Procedure appointments are limited to planned procedures: No Prescription Refills. No disability issues will be discussed. No medication changes will be discussed.  Instructions: Oral Intake: Do not eat or drink anything for at least 8 hours prior to your procedure. (Exception: Blood Pressure Medication. See below.) Transportation: A driver is required. You may not drive yourself after the procedure. Blood Pressure Medicine: Do not forget to take your blood pressure medicine with a sip of water the morning of the procedure. If your Diastolic (lower reading) is above 100 mmHg, elective cases will be cancelled/rescheduled. Blood thinners: These will need to be stopped for procedures. Notify our staff if you are taking any blood thinners. Depending on which one you take, there will be specific instructions on how and when to stop it. Diabetics on insulin: Notify the staff so that you can be scheduled 1st case in the morning. If your diabetes requires high dose insulin, take only  of your normal insulin dose the morning of the procedure and notify the staff that you have done so. Preventing infections: Shower with an antibacterial soap the morning of your procedure. Build-up your immune system: Take 1000 mg of Vitamin C with every meal (3 times a day) the day prior to your procedure. Antibiotics: Inform the staff if you have a condition or reason that requires you to take antibiotics before dental procedures. Pregnancy: If you are pregnant, call and cancel the procedure. Sickness: If  you have a cold, fever, or any active infections, call and cancel the procedure. Arrival: You must be in the facility at least 30 minutes prior to your scheduled procedure. Children: Do not bring children with you. Dress appropriately: Bring dark clothing that you would not mind if they get stained. Valuables: Do not bring any jewelry or valuables.  Reasons to call and reschedule or cancel your procedure: (Following these recommendations will minimize the risk of a serious complication.) Surgeries: Avoid having procedures within 2 weeks of any surgery. (Avoid for 2 weeks before or after any surgery). Flu Shots: Avoid having procedures within 2 weeks of a flu shots. (Avoid for 2 weeks before or after immunizations). Barium: Avoid having a procedure within 7-10 days after having had a radiological study involving the use of radiological contrast. (Myelograms, Barium swallow or enema study). Heart attacks: Avoid any elective procedures or surgeries for the initial 6 months after a "Myocardial Infarction" (Heart Attack). Blood thinners: It is imperative that you stop these medications before procedures. Let us know if you if you take any blood thinner.  Infection: Avoid procedures during or within two weeks of an infection (including chest colds or gastrointestinal problems). Symptoms associated with infections include: Localized redness, fever, chills, night sweats or profuse sweating, burning sensation when voiding, cough, congestion, stuffiness, runny nose, sore throat, diarrhea, nausea, vomiting, cold or Flu symptoms, recent or current infections. It is specially important if the infection is over the area that we intend to treat. Heart and lung problems: Symptoms that may suggest an active cardiopulmonary problem include: cough, chest pain, breathing difficulties or shortness of breath, dizziness, ankle swelling, uncontrolled high or unusually low blood pressure, and/or palpitations. If you are  experiencing any of these symptoms, cancel your procedure and contact your primary care physician for an evaluation.  Remember:  Regular Business hours are:  Monday to Thursday 8:00 AM to 4:00 PM  Provider's Schedule: Milinda Pointer, MD:  Procedure days: Tuesday and Thursday 7:30 AM to 4:00 PM  Gillis Santa, MD:  Procedure days: Monday and Wednesday 7:30 AM to 4:00 PM ______________________________________________________________________  ____________________________________________________________________________________________  General Risks and Possible Complications  Patient Responsibilities: It is important that you read this as it is part of your informed consent. It is our duty to inform you of the risks and possible complications associated with treatments offered to you. It is your responsibility as a patient to read this and to ask questions about anything that is not clear or that you believe was not covered in this document.  Patient's Rights: You have the right to refuse treatment. You also have the right to change your mind, even after initially having agreed to have the treatment done. However, under this last option, if you wait until the last second to change your mind, you may be charged for the materials used up to that point.  Introduction: Medicine is not an Chief Strategy Officer. Everything in Medicine, including the lack of treatment(s), carries the potential for danger, harm, or loss (which is by definition: Risk). In Medicine, a complication is a secondary problem, condition, or disease that can aggravate an already existing one. All treatments carry the risk of possible complications. The fact that a side effects or complications occurs, does not imply that the treatment was conducted incorrectly. It must be clearly understood that these can happen even when everything is done following the highest safety standards.  No treatment: You can choose not to proceed with the  proposed treatment alternative. The "PRO(s)" would include: avoiding the risk of complications associated with the therapy. The "CON(s)" would include: not getting any of the treatment benefits. These benefits fall under one of three categories: diagnostic; therapeutic; and/or palliative. Diagnostic benefits include: getting information which can ultimately lead to improvement of the disease or symptom(s). Therapeutic benefits are those associated with the successful treatment of the disease. Finally, palliative benefits are those related to the decrease of the primary symptoms, without necessarily curing the condition (example: decreasing the pain from a flare-up of a chronic condition, such as incurable terminal cancer).  General Risks and Complications: These are associated to most interventional treatments. They can occur alone, or in combination. They fall under one of the following six (6) categories: no benefit or worsening of symptoms; bleeding; infection; nerve damage; allergic reactions; and/or death. No benefits or worsening of symptoms: In Medicine there are no guarantees, only probabilities. No healthcare provider can ever guarantee that a medical treatment will work, they can only state the probability that it may. Furthermore, there is always the possibility that the condition may worsen, either directly, or indirectly, as a consequence of the treatment. Bleeding: This is more common if the patient is taking a blood thinner, either prescription or over the counter (example: Goody Powders, Fish oil, Aspirin, Garlic, etc.), or if suffering a condition associated with impaired coagulation (example: Hemophilia, cirrhosis of the liver, low platelet counts, etc.). However, even if you do not have one on these, it can still happen. If you have any of these conditions, or take one of these drugs, make sure to notify your treating physician. Infection: This is more common in patients with a compromised  immune system, either due to disease (example:  diabetes, cancer, human immunodeficiency virus [HIV], etc.), or due to medications or treatments (example: therapies used to treat cancer and rheumatological diseases). However, even if you do not have one on these, it can still happen. If you have any of these conditions, or take one of these drugs, make sure to notify your treating physician. Nerve Damage: This is more common when the treatment is an invasive one, but it can also happen with the use of medications, such as those used in the treatment of cancer. The damage can occur to small secondary nerves, or to large primary ones, such as those in the spinal cord and brain. This damage may be temporary or permanent and it may lead to impairments that can range from temporary numbness to permanent paralysis and/or brain death. Allergic Reactions: Any time a substance or material comes in contact with our body, there is the possibility of an allergic reaction. These can range from a mild skin rash (contact dermatitis) to a severe systemic reaction (anaphylactic reaction), which can result in death. Death: In general, any medical intervention can result in death, most of the time due to an unforeseen complication. ____________________________________________________________________________________________

## 2021-06-06 DIAGNOSIS — M48061 Spinal stenosis, lumbar region without neurogenic claudication: Secondary | ICD-10-CM | POA: Diagnosis not present

## 2021-06-12 ENCOUNTER — Ambulatory Visit (INDEPENDENT_AMBULATORY_CARE_PROVIDER_SITE_OTHER): Payer: Medicare Other | Admitting: Dermatology

## 2021-06-12 ENCOUNTER — Other Ambulatory Visit: Payer: Self-pay

## 2021-06-12 ENCOUNTER — Encounter: Payer: Self-pay | Admitting: Dermatology

## 2021-06-12 DIAGNOSIS — C4441 Basal cell carcinoma of skin of scalp and neck: Secondary | ICD-10-CM | POA: Diagnosis not present

## 2021-06-12 MED ORDER — MUPIROCIN 2 % EX OINT: 1.0000 "application " | TOPICAL_OINTMENT | Freq: Every day | CUTANEOUS | 0 refills | Status: DC

## 2021-06-12 NOTE — Patient Instructions (Signed)
Wound Care Instructions  Cleanse wound gently with soap and water once a day then pat dry with clean gauze. Apply a thing coat of Petrolatum (petroleum jelly, "Vaseline") over the wound (unless you have an allergy to this). We recommend that you use a new, sterile tube of Vaseline. Do not pick or remove scabs. Do not remove the yellow or white "healing tissue" from the base of the wound.  Cover the wound with fresh, clean, nonstick gauze and secure with paper tape. You may use Band-Aids in place of gauze and tape if the would is small enough, but would recommend trimming much of the tape off as there is often too much. Sometimes Band-Aids can irritate the skin.  You should call the office for your biopsy report after 1 week if you have not already been contacted.  If you experience any problems, such as abnormal amounts of bleeding, swelling, significant bruising, significant pain, or evidence of infection, please call the office immediately.  FOR ADULT SURGERY PATIENTS: If you need something for pain relief you may take 1 extra strength Tylenol (acetaminophen) AND 2 Ibuprofen (200mg each) together every 4 hours as needed for pain. (do not take these if you are allergic to them or if you have a reason you should not take them.) Typically, you may only need pain medication for 1 to 3 days.   If you have any questions or concerns for your doctor, please call our main line at [phone] and press option 4 to reach your doctor's medical assistant. If no one answers, please leave a voicemail as directed and we will return your call as soon as possible. Messages left after 4 pm will be answered the following business day.   You may also send us a message via MyChart. We typically respond to MyChart messages within 1-2 business days.  For prescription refills, please ask your pharmacy to contact our office. Our fax number is [phone].  If you have an urgent issue when the clinic is closed that  cannot wait until the next business day, you can page your doctor at the number below.    Please note that while we do our best to be available for urgent issues outside of office hours, we are not available 24/7.   If you have an urgent issue and are unable to reach us, you may choose to seek medical care at your doctor's office, retail clinic, urgent care center, or emergency room.  If you have a medical emergency, please immediately call 911 or go to the emergency department.  Pager Numbers  - Dr. Kowalski: [phone]  - Dr. Moye: [phone]  - Dr. Stewart: [phone]  In the event of inclement weather, please call our main line at [phone] for an update on the status of any delays or closures.  Dermatology Medication Tips: Please keep the boxes that topical medications come in in order to help keep track of the instructions about where and how to use these. Pharmacies typically print the medication instructions only on the boxes and not directly on the medication tubes.   If your medication is too expensive, please contact our office at [phone] option 4 or send us a message through MyChart.   We are unable to tell what your co-pay for medications will be in advance as this is different depending on your insurance coverage. However, we may be able to find a substitute medication at lower cost or fill out paperwork to get insurance to cover a needed   medication.   If a prior authorization is required to get your medication covered by your insurance company, please allow us 1-2 business days to complete this process.  Drug prices often vary depending on where the prescription is filled and some pharmacies may offer cheaper prices.  The website www.goodrx.com contains coupons for medications through different pharmacies. The prices here do not account for what the cost may be with help from insurance (it may be cheaper with your insurance), but the website can give you the  price if you did not use any insurance.  - You can print the associated coupon and take it with your prescription to the pharmacy.  - You may also stop by our office during regular business hours and pick up a GoodRx coupon card.  - If you need your prescription sent electronically to a different pharmacy, notify our office through [facility] MyChart or by phone at [phone] option 4.   

## 2021-06-12 NOTE — Progress Notes (Signed)
Follow-Up Visit   Subjective  [name] is a 85 y.o. female who presents for the following: Basal Cell Carcinoma (Biopsy proven of right neck postauricular - Excise today).  The following portions of the chart were reviewed this encounter and updated as appropriate:   Tobacco  Allergies  Meds  Problems  Med Hx  Surg Hx  Fam Hx     Review of Systems:  No other skin or systemic complaints except as noted in HPI or Assessment and Plan.  Objective  Well appearing patient in no apparent distress; mood and affect are within normal limits.  A focused examination was performed including neck/ear. Relevant physical exam findings are noted in the Assessment and Plan.  Right neck postauricular Healing biopsy site 2.1 x 1.5cm   Assessment & Plan  Basal cell carcinoma (BCC) of skin of neck Right neck postauricular  Skin excision  Lesion length (cm):  2.1 Lesion width (cm):  1.5 Margin per side (cm):  0.2 Total excision diameter (cm):  2.5 Informed consent: discussed and consent obtained   Timeout: patient name, date of birth, surgical site, and procedure verified   Procedure prep:  Patient was prepped and draped in usual sterile fashion Prep type:  Isopropyl alcohol and povidone-iodine Anesthesia: the lesion was anesthetized in a standard fashion   Anesthetic:  1% lidocaine w/ epinephrine 1-100,000 buffered w/ 8.4% NaHCO3 (4cc lido w/ epi, 3cc bupivicaine, total of 7cc) Instrument used: #15 blade   Hemostasis achieved with: pressure   Hemostasis achieved with comment:  Electrocautery Outcome: patient tolerated procedure well with no complications   Post-procedure details: sterile dressing applied and wound care instructions given   Dressing type: bandage, pressure dressing and bacitracin (mupirocin)    Skin repair Complexity:  Intermediate Final length (cm):  2.7 Informed consent: discussed and consent obtained   Timeout: patient name, date of birth, surgical site,  and procedure verified   Procedure prep:  Patient was prepped and draped in usual sterile fashion Prep type:  Povidone-iodine Anesthesia: the lesion was anesthetized in a standard fashion   Anesthetic:  1% lidocaine w/ epinephrine 1-100,000 buffered w/ 8.4% NaHCO3 Reason for type of repair: reduce tension to allow closure, reduce the risk of dehiscence, infection, and necrosis, reduce subcutaneous dead space and avoid a hematoma, allow closure of the large defect, preserve normal anatomy, preserve normal anatomical and functional relationships and enhance both functionality and cosmetic results   Undermining: area extensively undermined   Undermining comment:  Undermining defect 2.5cm Subcutaneous layers (deep stitches):  Suture size:  4-0 Suture type: Vicryl (polyglactin 910)   Subcutaneous suture technique: inverted dermal. Fine/surface layer approximation (top stitches):  Suture size:  4-0 Suture type: nylon   Suture type comment:  Nylon Stitches: horizontal mattress   Stitches comment:  Purse string closure and 1 horizontal mattress Suture removal (days):  7 Hemostasis achieved with: suture and pressure Hemostasis achieved with comment:  Electrocautery Outcome: patient tolerated procedure well with no complications   Post-procedure details: sterile dressing applied and wound care instructions given   Dressing type: bandage and pressure dressing (mupirocin)    mupirocin ointment (BACTROBAN) 2 % Apply 1 application topically daily. With dressing changes  Specimen 1 - Surgical pathology Differential Diagnosis: Biopsy proven BCC Check Margins: No Healing bx site 2.1 x 1.5cm [id number] Tag 12:00 superior edge  Return in about 2 days (around 06/14/2021) for dressing change.  I, Othelia Pulling, RMA, am acting as scribe for Sarina Ser, MD . Documentation:  I have reviewed the above documentation for accuracy and completeness, and I agree with the above.  Sarina Ser, MD

## 2021-06-13 ENCOUNTER — Encounter: Payer: Self-pay | Admitting: Dermatology

## 2021-06-13 ENCOUNTER — Telehealth: Payer: Self-pay

## 2021-06-13 ENCOUNTER — Telehealth: Payer: Self-pay | Admitting: Primary Care

## 2021-06-13 DIAGNOSIS — M25519 Pain in unspecified shoulder: Secondary | ICD-10-CM | POA: Diagnosis not present

## 2021-06-13 DIAGNOSIS — M5416 Radiculopathy, lumbar region: Secondary | ICD-10-CM | POA: Diagnosis not present

## 2021-06-13 DIAGNOSIS — Z79899 Other long term (current) drug therapy: Secondary | ICD-10-CM | POA: Diagnosis not present

## 2021-06-13 DIAGNOSIS — M25551 Pain in right hip: Secondary | ICD-10-CM | POA: Diagnosis not present

## 2021-06-13 DIAGNOSIS — G894 Chronic pain syndrome: Secondary | ICD-10-CM | POA: Diagnosis not present

## 2021-06-13 NOTE — Chronic Care Management (AMB) (Signed)
  Chronic Care Management   Outreach Note  06/13/2021 Name: [name] MRN: [id number] DOB: 10/29/1935  Referred by: [name], NP Reason for referral : No chief complaint on file.   An unsuccessful telephone outreach was attempted today. The patient was referred to the pharmacist for assistance with care management and care coordination.   Follow Up Plan:   Tatjana Dellinger Upstream Scheduler

## 2021-06-13 NOTE — Telephone Encounter (Signed)
Pt doing fine after yesterdays surgery./sh 

## 2021-06-14 ENCOUNTER — Other Ambulatory Visit: Payer: Self-pay

## 2021-06-14 ENCOUNTER — Ambulatory Visit (INDEPENDENT_AMBULATORY_CARE_PROVIDER_SITE_OTHER): Payer: Medicare Other | Admitting: Dermatology

## 2021-06-14 DIAGNOSIS — R0602 Shortness of breath: Secondary | ICD-10-CM | POA: Diagnosis not present

## 2021-06-14 DIAGNOSIS — E7800 Pure hypercholesterolemia, unspecified: Secondary | ICD-10-CM | POA: Diagnosis not present

## 2021-06-14 DIAGNOSIS — I10 Essential (primary) hypertension: Secondary | ICD-10-CM | POA: Diagnosis not present

## 2021-06-14 DIAGNOSIS — I208 Other forms of angina pectoris: Secondary | ICD-10-CM | POA: Diagnosis not present

## 2021-06-14 DIAGNOSIS — Z79899 Other long term (current) drug therapy: Secondary | ICD-10-CM | POA: Diagnosis not present

## 2021-06-14 DIAGNOSIS — R600 Localized edema: Secondary | ICD-10-CM | POA: Diagnosis not present

## 2021-06-14 DIAGNOSIS — R002 Palpitations: Secondary | ICD-10-CM | POA: Diagnosis not present

## 2021-06-14 DIAGNOSIS — G4733 Obstructive sleep apnea (adult) (pediatric): Secondary | ICD-10-CM | POA: Diagnosis not present

## 2021-06-14 DIAGNOSIS — C4441 Basal cell carcinoma of skin of scalp and neck: Secondary | ICD-10-CM

## 2021-06-14 DIAGNOSIS — G3184 Mild cognitive impairment, so stated: Secondary | ICD-10-CM | POA: Diagnosis not present

## 2021-06-14 DIAGNOSIS — R053 Chronic cough: Secondary | ICD-10-CM | POA: Diagnosis not present

## 2021-06-14 DIAGNOSIS — J8410 Pulmonary fibrosis, unspecified: Secondary | ICD-10-CM | POA: Diagnosis not present

## 2021-06-14 DIAGNOSIS — F419 Anxiety disorder, unspecified: Secondary | ICD-10-CM | POA: Diagnosis not present

## 2021-06-14 DIAGNOSIS — I429 Cardiomyopathy, unspecified: Secondary | ICD-10-CM | POA: Diagnosis not present

## 2021-06-14 NOTE — Progress Notes (Signed)
   Follow-Up Visit   Subjective  [name] is a 85 y.o. female who presents for the following: BCC margins free, bx proven (R neck post auricular, 2 day dressing change).  The following portions of the chart were reviewed this encounter and updated as appropriate:   Tobacco  Allergies  Meds  Problems  Med Hx  Surg Hx  Fam Hx     Review of Systems:  No other skin or systemic complaints except as noted in HPI or Assessment and Plan.  Objective  Well appearing patient in no apparent distress; mood and affect are within normal limits.  A focused examination was performed including right neck postauricular. Relevant physical exam findings are noted in the Assessment and Plan.  R neck post auricular Healing excision site   Assessment & Plan  Basal cell carcinoma (BCC) of skin of neck R neck post auricular  Margins free, bx proven  Healing excision site  Wound cleansed with puracyn, mupirocin applied followed by bangage  Discussed pathology with pt  Related Medications mupirocin ointment (BACTROBAN) 2 % Apply 1 application topically daily. With dressing changes  Return for as scheduled.  I, Othelia Pulling, RMA, am acting as scribe for Sarina Ser, MD . Documentation: I have reviewed the above documentation for accuracy and completeness, and I agree with the above.  Sarina Ser, MD

## 2021-06-14 NOTE — Patient Instructions (Signed)

## 2021-06-18 ENCOUNTER — Encounter: Payer: Self-pay | Admitting: Dermatology

## 2021-06-18 ENCOUNTER — Telehealth: Payer: Self-pay | Admitting: Gastroenterology

## 2021-06-18 DIAGNOSIS — A0472 Enterocolitis due to Clostridium difficile, not specified as recurrent: Secondary | ICD-10-CM

## 2021-06-18 NOTE — Telephone Encounter (Signed)
Pt. Calling with a question about a possible C-diff flare up. She is displaying symptoms and would like to know if she needs to restart her medication. She is requesting a call back

## 2021-06-19 ENCOUNTER — Encounter: Payer: Self-pay | Admitting: Pain Medicine

## 2021-06-19 ENCOUNTER — Ambulatory Visit (INDEPENDENT_AMBULATORY_CARE_PROVIDER_SITE_OTHER): Payer: Medicare Other | Admitting: Dermatology

## 2021-06-19 ENCOUNTER — Ambulatory Visit
Admission: RE | Admit: 2021-06-19 | Discharge: 2021-06-19 | Disposition: A | Discharge status: Home / Self Care | Payer: Medicare Other | Source: Ambulatory Visit | Attending: Pain Medicine | Admitting: Pain Medicine

## 2021-06-19 ENCOUNTER — Ambulatory Visit (HOSPITAL_BASED_OUTPATIENT_CLINIC_OR_DEPARTMENT_OTHER): Payer: Medicare Other | Admitting: Pain Medicine

## 2021-06-19 ENCOUNTER — Other Ambulatory Visit: Payer: Self-pay

## 2021-06-19 VITALS — BP 165/75 | HR 62 | Temp 97.1°F | Resp 16 | Ht 60.0 in | Wt 126.0 lb

## 2021-06-19 DIAGNOSIS — G8929 Other chronic pain: Secondary | ICD-10-CM | POA: Diagnosis not present

## 2021-06-19 DIAGNOSIS — M47817 Spondylosis without myelopathy or radiculopathy, lumbosacral region: Secondary | ICD-10-CM

## 2021-06-19 DIAGNOSIS — M5137 Other intervertebral disc degeneration, lumbosacral region: Secondary | ICD-10-CM | POA: Insufficient documentation

## 2021-06-19 DIAGNOSIS — M545 Low back pain, unspecified: Secondary | ICD-10-CM | POA: Diagnosis not present

## 2021-06-19 DIAGNOSIS — Z85828 Personal history of other malignant neoplasm of skin: Secondary | ICD-10-CM

## 2021-06-19 DIAGNOSIS — M47816 Spondylosis without myelopathy or radiculopathy, lumbar region: Secondary | ICD-10-CM | POA: Insufficient documentation

## 2021-06-19 DIAGNOSIS — Z4802 Encounter for removal of sutures: Secondary | ICD-10-CM

## 2021-06-19 MED ORDER — MIDAZOLAM HCL 5 MG/5ML IJ SOLN
1.0000 mg | Freq: Once | INTRAMUSCULAR | Status: AC
  Administered 2021-06-19: 1 mg via INTRAVENOUS

## 2021-06-19 MED ORDER — MIDAZOLAM HCL 5 MG/5ML IJ SOLN
INTRAMUSCULAR | Status: AC
  Filled 2021-06-19: qty 5

## 2021-06-19 MED ORDER — ROPIVACAINE HCL 2 MG/ML IJ SOLN
9.0000 mL | Freq: Once | INTRAMUSCULAR | Status: AC
  Administered 2021-06-19: 9 mL via PERINEURAL

## 2021-06-19 MED ORDER — TRIAMCINOLONE ACETONIDE 40 MG/ML IJ SUSP
INTRAMUSCULAR | Status: AC
  Filled 2021-06-19: qty 1

## 2021-06-19 MED ORDER — LACTATED RINGERS IV SOLN
1000.0000 mL | Freq: Once | INTRAVENOUS | Status: AC
  Administered 2021-06-19: 1000 mL via INTRAVENOUS

## 2021-06-19 MED ORDER — LIDOCAINE HCL 2 % IJ SOLN
INTRAMUSCULAR | Status: AC
  Filled 2021-06-19: qty 10

## 2021-06-19 MED ORDER — LIDOCAINE HCL 2 % IJ SOLN
20.0000 mL | Freq: Once | INTRAMUSCULAR | Status: AC
  Administered 2021-06-19: 400 mg

## 2021-06-19 MED ORDER — ROPIVACAINE HCL 2 MG/ML IJ SOLN
INTRAMUSCULAR | Status: AC
  Filled 2021-06-19: qty 20

## 2021-06-19 MED ORDER — TRIAMCINOLONE ACETONIDE 40 MG/ML IJ SUSP
40.0000 mg | Freq: Once | INTRAMUSCULAR | Status: AC
  Administered 2021-06-19: 40 mg

## 2021-06-19 NOTE — Telephone Encounter (Signed)
Check stool for c diff and GI pcr

## 2021-06-19 NOTE — Progress Notes (Signed)
   Follow-Up Visit   Subjective  KATALEA UCCI is a 85 y.o. female who presents for the following: post op./suture removal (R neck post auricular - margins free BCC, patient is here today for suture removal.).  The following portions of the chart were reviewed this encounter and updated as appropriate:   Tobacco  Allergies  Meds  Problems  Med Hx  Surg Hx  Fam Hx     Review of Systems:  No other skin or systemic complaints except as noted in HPI or Assessment and Plan.  Objective  Well appearing patient in no apparent distress; mood and affect are within normal limits.  A focused examination was performed including the R neck postauricular area. Relevant physical exam findings are noted in the Assessment and Plan.  R neck post auricular Healing excision site.   Assessment & Plan  History of basal cell carcinoma (BCC) R neck post auricular  Encounter for Removal of Sutures - Incision site at the R neck post auricular is clean, dry and intact - Wound cleansed, sutures removed, wound cleansed and steri strips applied.  - Discussed pathology results showing a margins free basal cell carcinoma  - Patient advised to keep steri-strips dry until they fall off. - Scars remodel for a full year. - Once steri-strips fall off, patient can apply over-the-counter silicone scar cream each night to help with scar remodeling if desired. - Patient advised to call with any concerns or if they notice any new or changing lesions.   Return in about 2 weeks (around 07/03/2021) for wound recheck.  Luther Redo, CMA, am acting as scribe for Sarina Ser, MD . Documentation: I have reviewed the above documentation for accuracy and completeness, and I agree with the above.  Sarina Ser, MD

## 2021-06-19 NOTE — Patient Instructions (Signed)
____________________________________________________________________________________________  Virtual Visits   What is a "Virtual Visit"? It is a healthcare communication encounter (medical visit) that takes place on real time (NOT TEXT or E-MAIL) over the telephone or computer device (desktop, laptop, tablet, smart phone, etc.). It allows for more location flexibility between the patient and the healthcare provider.  Who decides when these types of visits will be used? The physician.  Who is eligible for these types of visits? Only those patients that can be reliably reached over the telephone.  What do you mean by reliably? We do not have time to call everyone multiple times, therefore those that tend to screen calls and then call back later are not suitable candidates for this system. We understand how people are reluctant to pickup on "unknown" calls, therefore, we suggest adding our telephone numbers to your list of "CONTACT(s)". This way, you should be able to readily identify our calls when you receive one. All of our numbers are available below.   Who is not eligible? This option is not available for medication management encounters, specially for controlled substances. Patients on pain medications that fall under the category of controlled substances have to come in for "Face-to-Face" encounters. This is required for mandatory monitoring of these substances. You may be asked to provide a sample for an unannounced urine drug screening test (UDS), and we will need to count your pain pills. Not bringing your pills to be counted may result in no refill. Obviously, neither one of these can be done over the phone.  When will this type of visits be used? You can request a virtual visit whenever you are physically unable to attend a regular appointment. The decision will be made by the physician (or healthcare provider) on a case by case basis.   At what time will I be called? This is an  excellent question. The providers will try to call you whenever they have time available. Do not expect to be called at any specific time. The secretaries will assign you a time for your virtual visit appointment, but this is done simply to keep a list of those patients that need to be called, but not for the purpose of keeping a time schedule. Be advised that the call may come in anytime during the day, between the hours of 8:00 AM and 8::00 PM, depending on provider availability. We do understand that the system is not perfect. If you are unable to be available that day on a moments notice, then request an "in-person" appointment rather than a "virtual visit".  Can I request my medication visits to be "Virtual"? Yes you may request it, but the decision is entirely up to the healthcare provider. Control substances require specific monitoring that requires Face-to-Face encounters. The number of encounters  and the extent of the monitoring is determined on a case by case basis.  Add a new contact to your smart phone and label it "PAIN CLINIC" Under this contact add the following numbers: Main: [phone] (Official Contact Number) Nurses: [phone] (These are outgoing only calling systems. Do not call this number.) Dr. Jillann Charette: [phone] or [phone] (Outgoing calls only. Do not call this number.)  ____________________________________________________________________________________________   ____________________________________________________________________________________________  Post-Procedure Discharge Instructions  Instructions: Apply ice:  Purpose: This will minimize any swelling and discomfort after procedure.  When: Day of procedure, as soon as you get home. How: Fill a plastic sandwich bag with crushed ice. Cover it with a small towel and apply to   injection site. How long: (15 min on, 15 min off) Apply for 15 minutes then remove x 15 minutes.  Repeat sequence on day  of procedure, until you go to bed. Apply heat:  Purpose: To treat any soreness and discomfort from the procedure. When: Starting the next day after the procedure. How: Apply heat to procedure site starting the day following the procedure. How long: May continue to repeat daily, until discomfort goes away. Food intake: Start with clear liquids (like water) and advance to regular food, as tolerated.  Physical activities: Keep activities to a minimum for the first 8 hours after the procedure. After that, then as tolerated. Driving: If you have received any sedation, be responsible and do not drive. You are not allowed to drive for 24 hours after having sedation. Blood thinner: (Applies only to those taking blood thinners) You may restart your blood thinner 6 hours after your procedure. Insulin: (Applies only to Diabetic patients taking insulin) As soon as you can eat, you may resume your normal dosing schedule. Infection prevention: Keep procedure site clean and dry. Shower daily and clean area with soap and water. Post-procedure Pain Diary: Extremely important that this be done correctly and accurately. Recorded information will be used to determine the next step in treatment. For the purpose of accuracy, follow these rules: Evaluate only the area treated. Do not report or include pain from an untreated area. For the purpose of this evaluation, ignore all other areas of pain, except for the treated area. After your procedure, avoid taking a long nap and attempting to complete the pain diary after you wake up. Instead, set your alarm clock to go off every hour, on the hour, for the initial 8 hours after the procedure. Document the duration of the numbing medicine, and the relief you are getting from it. Do not go to sleep and attempt to complete it later. It will not be accurate. If you received sedation, it is likely that you were given a medication that may cause amnesia. Because of this, completing  the diary at a later time may cause the information to be inaccurate. This information is needed to plan your care. Follow-up appointment: Keep your post-procedure follow-up evaluation appointment after the procedure (usually 2 weeks for most procedures, 6 weeks for radiofrequencies). DO NOT FORGET to bring you pain diary with you.   Expect: (What should I expect to see with my procedure?) From numbing medicine (AKA: Local Anesthetics): Numbness or decrease in pain. You may also experience some weakness, which if present, could last for the duration of the local anesthetic. Onset: Full effect within 15 minutes of injected. Duration: It will depend on the type of local anesthetic used. On the average, 1 to 8 hours.  From steroids (Applies only if steroids were used): Decrease in swelling or inflammation. Once inflammation is improved, relief of the pain will follow. Onset of benefits: Depends on the amount of swelling present. The more swelling, the longer it will take for the benefits to be seen. In some cases, up to 10 days. Duration: Steroids will stay in the system x 2 weeks. Duration of benefits will depend on multiple posibilities including persistent irritating factors. Side-effects: If present, they may typically last 2 weeks (the duration of the steroids). Frequent: Cramps (if they occur, drink Gatorade and take over-the-counter Magnesium 450-500 mg once to twice a day); water retention with temporary weight gain; increases in blood sugar; decreased immune system response; increased appetite. Occasional: Facial flushing (red,   warm cheeks); mood swings; menstrual changes. Uncommon: Long-term decrease or suppression of natural hormones; bone thinning. (These are more common with higher doses or more frequent use. This is why we prefer that our patients avoid having any injection therapies in other practices.)  Very Rare: Severe mood changes; psychosis; aseptic necrosis. From procedure: Some  discomfort is to be expected once the numbing medicine wears off. This should be minimal if ice and heat are applied as instructed.  Call if: (When should I call?) You experience numbness and weakness that gets worse with time, as opposed to wearing off. New onset bowel or bladder incontinence. (Applies only to procedures done in the spine)  Emergency Numbers: Durning business hours (Monday - Thursday, 8:00 AM - 4:00 PM) (Friday, 9:00 AM - 12:00 Noon): [phone] After hours: [phone] NOTE: If you are having a problem and are unable connect with, or to talk to a provider, then go to your nearest urgent care or emergency department. If the problem is serious and urgent, please call 911. ____________________________________________________________________________________________   

## 2021-06-19 NOTE — Telephone Encounter (Signed)
Patient stated that she has had diarrhea and believes that she has C-Diff again. Please advise.

## 2021-06-19 NOTE — Progress Notes (Signed)
PROVIDER NOTE: Information contained herein reflects review and annotations entered in association with encounter. Interpretation of such information and data should be left to medically-trained personnel. Information provided to patient can be located elsewhere in the medical record under "Patient Instructions". Document created using STT-dictation technology, any transcriptional errors that may result from process are unintentional.    Patient: [name]  Service Category: Procedure  Provider: Oswaldo Done, MD  DOB: 04/05/1936  DOS: 06/19/2021  Location: [facility]  MRN: [id number]  Setting: Ambulatory - outpatient  Referring Provider: Doreene Nest, NP  Type: Established Patient  Specialty: Interventional Pain Management  PCP: Doreene Nest, NP   Primary Reason for Visit: Interventional Pain Management Treatment. CC: Back Pain (Left, lower)    Procedure:          Anesthesia, Analgesia, Anxiolysis:  Type: Lumbar Facet, Medial Branch Block(s) #2  Primary Purpose: Diagnostic Region: Posterolateral Lumbosacral Spine Level: L2, L3, L4, L5, & S1 Medial Branch Level(s). Injecting these levels blocks the L3-4, L4-5, and L5-S1 lumbar facet joints. Laterality: Left  Type: Local Anesthesia Local Anesthetic: Lidocaine 1-2% Sedation: Minimal Anxiolysis  Indication(s): Anxiety & Analgesia Route: Infiltration (Eastman/IM) IV Access: Available   Position: Prone   Indications: 1. Lumbar facet syndrome   2. Spondylosis without myelopathy or radiculopathy, lumbosacral region   3. DDD (degenerative disc disease), lumbosacral   4. Chronic low back pain (1ry area of Pain) (Bilateral) (L>R) w/o sciatica   5. Lumbosacral facet arthropathy (Multilevel) (Bilateral)    Pain Score: Pre-procedure: 6 /10 Post-procedure: 6 /10     Pre-op H&P Assessment:  [name] is a 85 y.o. (year old), female patient, seen today for interventional treatment. She  has a past  surgical history that includes Rotator cuff repair (Bilateral); Total shoulder replacement (Left); Cataract extraction w/PHACO (Left, 04/24/2016); Cataract extraction w/PHACO (Right, 07/14/2017); Esophagogastroduodenoscopy (egd) with propofol (N/A, 10/13/2018); Flexible sigmoidoscopy (N/A, 10/13/2018); Cholecystectomy; Tonsillectomy; Back surgery; Flexible sigmoidoscopy (N/A, 09/09/2019); Flexible sigmoidoscopy (N/A, 08/04/2020); Hip Arthroplasty (Right, 09/27/2020); and Cystoscopy/ureteroscopy/holmium laser/stent placement (Left, 04/27/2021). [name] has a current medication list which includes the following prescription(s): atorvastatin, budesonide, calcium carbonate-vitamin d, vitamin d3, citalopram, denosumab, esbriet, magnesium oxide, metoprolol succinate, oxygen-helium, sulfasalazine, vitamin b-12, voltaren, calcium carbonate, hydrocodone-acetaminophen, hydrocodone-acetaminophen, and mupirocin ointment. Her primarily concern today is the Back Pain (Left, lower)  Initial Vital Signs:  Pulse/HCG Rate: 62  Temp: (!) 97.1 F (36.2 C) Resp: 16 BP: (!) 155/96 SpO2: 92 %  BMI: Estimated body mass index is 24.61 kg/m as calculated from the following:   Height as of this encounter: 5' (1.524 m).   Weight as of this encounter: 126 lb (57.2 kg).  Risk Assessment: Allergies: Reviewed. She has No Known Allergies.  Allergy Precautions: None required Coagulopathies: Reviewed. None identified.  Blood-thinner therapy: None at this time Active Infection(s): Reviewed. None identified. [name] is afebrile  Site Confirmation: [name] was asked to confirm the procedure and laterality before marking the site Procedure checklist: Completed Consent: Before the procedure and under the influence of no sedative(s), amnesic(s), or anxiolytics, the patient was informed of the treatment options, risks and possible complications. To fulfill our ethical and legal obligations, as recommended by the American  Medical Association's Code of Ethics, I have informed the patient of my clinical impression; the nature and purpose of the treatment or procedure; the risks, benefits, and possible complications of the intervention; the alternatives, including doing nothing; the risk(s) and benefit(s) of the alternative treatment(s) or  procedure(s); and the risk(s) and benefit(s) of doing nothing. The patient was provided information about the general risks and possible complications associated with the procedure. These may include, but are not limited to: failure to achieve desired goals, infection, bleeding, organ or nerve damage, allergic reactions, paralysis, and death. In addition, the patient was informed of those risks and complications associated to Spine-related procedures, such as failure to decrease pain; infection (i.e.: Meningitis, epidural or intraspinal abscess); bleeding (i.e.: epidural hematoma, subarachnoid hemorrhage, or any other type of intraspinal or peri-dural bleeding); organ or nerve damage (i.e.: Any type of peripheral nerve, nerve root, or spinal cord injury) with subsequent damage to sensory, motor, and/or autonomic systems, resulting in permanent pain, numbness, and/or weakness of one or several areas of the body; allergic reactions; (i.e.: anaphylactic reaction); and/or death. Furthermore, the patient was informed of those risks and complications associated with the medications. These include, but are not limited to: allergic reactions (i.e.: anaphylactic or anaphylactoid reaction(s)); adrenal axis suppression; blood sugar elevation that in diabetics may result in ketoacidosis or comma; water retention that in patients with history of congestive heart failure may result in shortness of breath, pulmonary edema, and decompensation with resultant heart failure; weight gain; swelling or edema; medication-induced neural toxicity; particulate matter embolism and blood vessel occlusion with resultant organ,  and/or nervous system infarction; and/or aseptic necrosis of one or more joints. Finally, the patient was informed that Medicine is not an exact science; therefore, there is also the possibility of unforeseen or unpredictable risks and/or possible complications that may result in a catastrophic outcome. The patient indicated having understood very clearly. We have given the patient no guarantees and we have made no promises. Enough time was given to the patient to ask questions, all of which were answered to the patient's satisfaction. Ms. Prosch has indicated that she wanted to continue with the procedure. Attestation: I, the ordering provider, attest that I have discussed with the patient the benefits, risks, side-effects, alternatives, likelihood of achieving goals, and potential problems during recovery for the procedure that I have provided informed consent. Date  Time: 06/19/2021  9:10 AM  Pre-Procedure Preparation:  Monitoring: As per clinic protocol. Respiration, ETCO2, SpO2, BP, heart rate and rhythm monitor placed and checked for adequate function Safety Precautions: Patient was assessed for positional comfort and pressure points before starting the procedure. Time-out: I initiated and conducted the "Time-out" before starting the procedure, as per protocol. The patient was asked to participate by confirming the accuracy of the "Time Out" information. Verification of the correct person, site, and procedure were performed and confirmed by me, the nursing staff, and the patient. "Time-out" conducted as per Joint Commission's Universal Protocol (UP.01.01.01). Time: 1004  Description of Procedure:          Laterality: Left Levels:  L2, L3, L4, L5, & S1 Medial Branch Level(s) Area Prepped: Posterior Lumbosacral Region DuraPrep (Iodine Povacrylex [0.7% available iodine] and Isopropyl Alcohol, 74% w/w) Safety Precautions: Aspiration looking for blood return was conducted prior to all injections.  At no point did we inject any substances, as a needle was being advanced. Before injecting, the patient was told to immediately notify me if she was experiencing any new onset of "ringing in the ears, or metallic taste in the mouth". No attempts were made at seeking any paresthesias. Safe injection practices and needle disposal techniques used. Medications properly checked for expiration dates. SDV (single dose vial) medications used. After the completion of the procedure, all disposable equipment used  was discarded in the proper designated medical waste containers. Local Anesthesia: Protocol guidelines were followed. The patient was positioned over the fluoroscopy table. The area was prepped in the usual manner. The time-out was completed. The target area was identified using fluoroscopy. A 12-in long, straight, sterile hemostat was used with fluoroscopic guidance to locate the targets for each level blocked. Once located, the skin was marked with an approved surgical skin marker. Once all sites were marked, the skin (epidermis, dermis, and hypodermis), as well as deeper tissues (fat, connective tissue and muscle) were infiltrated with a small amount of a short-acting local anesthetic, loaded on a 10cc syringe with a 25G, 1.5-in  Needle. An appropriate amount of time was allowed for local anesthetics to take effect before proceeding to the next step. Local Anesthetic: Lidocaine 2.0% The unused portion of the local anesthetic was discarded in the proper designated containers. Technical explanation of process:  L2 Medial Branch Nerve Block (MBB): The target area for the L2 medial branch is at the junction of the postero-lateral aspect of the superior articular process and the superior, posterior, and medial edge of the transverse process of L3. Under fluoroscopic guidance, a Quincke needle was inserted until contact was made with os over the superior postero-lateral aspect of the pedicular shadow (target area).  After negative aspiration for blood, 0.5 mL of the nerve block solution was injected without difficulty or complication. The needle was removed intact. L3 Medial Branch Nerve Block (MBB): The target area for the L3 medial branch is at the junction of the postero-lateral aspect of the superior articular process and the superior, posterior, and medial edge of the transverse process of L4. Under fluoroscopic guidance, a Quincke needle was inserted until contact was made with os over the superior postero-lateral aspect of the pedicular shadow (target area). After negative aspiration for blood, 0.5 mL of the nerve block solution was injected without difficulty or complication. The needle was removed intact. L4 Medial Branch Nerve Block (MBB): The target area for the L4 medial branch is at the junction of the postero-lateral aspect of the superior articular process and the superior, posterior, and medial edge of the transverse process of L5. Under fluoroscopic guidance, a Quincke needle was inserted until contact was made with os over the superior postero-lateral aspect of the pedicular shadow (target area). After negative aspiration for blood, 0.5 mL of the nerve block solution was injected without difficulty or complication. The needle was removed intact. L5 Medial Branch Nerve Block (MBB): The target area for the L5 medial branch is at the junction of the postero-lateral aspect of the superior articular process and the superior, posterior, and medial edge of the sacral ala. Under fluoroscopic guidance, a Quincke needle was inserted until contact was made with os over the superior postero-lateral aspect of the pedicular shadow (target area). After negative aspiration for blood, 0.5 mL of the nerve block solution was injected without difficulty or complication. The needle was removed intact. S1 Medial Branch Nerve Block (MBB): The target area for the S1 medial branch is at the posterior and inferior 6 o'clock  position of the L5-S1 facet joint. Under fluoroscopic guidance, the Quincke needle inserted for the L5 MBB was redirected until contact was made with os over the inferior and postero aspect of the sacrum, at the 6 o' clock position under the L5-S1 facet joint (Target area). After negative aspiration for blood, 0.5 mL of the nerve block solution was injected without difficulty or complication. The needle was  removed intact.  Nerve block solution: 0.2% PF-Ropivacaine + Triamcinolone (40 mg/mL) diluted to a final concentration of 4 mg of Triamcinolone/mL of Ropivacaine The unused portion of the solution was discarded in the proper designated containers. Procedural Needles: 22-gauge, 3.5-inch, Quincke needles used for all levels.  Once the entire procedure was completed, the treated area was cleaned, making sure to leave some of the prepping solution back to take advantage of its long term bactericidal properties.      Illustration of the posterior view of the lumbar spine and the posterior neural structures. Laminae of L2 through S1 are labeled. DPRL5, dorsal primary ramus of L5; DPRS1, dorsal primary ramus of S1; DPR3, dorsal primary ramus of L3; FJ, facet (zygapophyseal) joint L3-L4; I, inferior articular process of L4; LB1, lateral branch of dorsal primary ramus of L1; IAB, inferior articular branches from L3 medial branch (supplies L4-L5 facet joint); IBP, intermediate branch plexus; MB3, medial branch of dorsal primary ramus of L3; NR3, third lumbar nerve root; S, superior articular process of L5; SAB, superior articular branches from L4 (supplies L4-5 facet joint also); TP3, transverse process of L3.  Vitals:   06/19/21 0955 06/19/21 1000 06/19/21 1005 06/19/21 1010  BP: (!) 161/87 (!) 159/80 132/76 (!) 165/75  Pulse: 60 64 65 62  Resp:      Temp:      TempSrc:      SpO2: 100% 100% 100% 99%  Weight:      Height:         Start Time: 1004 hrs. End Time: 1009 hrs.  Imaging Guidance  (Spinal):          Type of Imaging Technique: Fluoroscopy Guidance (Spinal) Indication(s): Assistance in needle guidance and placement for procedures requiring needle placement in or near specific anatomical locations not easily accessible without such assistance. Exposure Time: Please see nurses notes. Contrast: None used. Fluoroscopic Guidance: I was personally present during the use of fluoroscopy. "Tunnel Vision Technique" used to obtain the best possible view of the target area. Parallax error corrected before commencing the procedure. "Direction-depth-direction" technique used to introduce the needle under continuous pulsed fluoroscopy. Once target was reached, antero-posterior, oblique, and lateral fluoroscopic projection used confirm needle placement in all planes. Images permanently stored in EMR. Interpretation: No contrast injected. I personally interpreted the imaging intraoperatively. Adequate needle placement confirmed in multiple planes. Permanent images saved into the patient's record.  Antibiotic Prophylaxis:   Anti-infectives (From admission, onward)    None      Indication(s): None identified  Post-operative Assessment:  Post-procedure Vital Signs:  Pulse/HCG Rate: 62 (nsr)  Temp: (!) 97.1 F (36.2 C) Resp: 16 BP: (!) 165/75 SpO2: 99 %  EBL: None  Complications: No immediate post-treatment complications observed by team, or reported by patient.  Note: The patient tolerated the entire procedure well. A repeat set of vitals were taken after the procedure and the patient was kept under observation following institutional policy, for this type of procedure. Post-procedural neurological assessment was performed, showing return to baseline, prior to discharge. The patient was provided with post-procedure discharge instructions, including a section on how to identify potential problems. Should any problems arise concerning this procedure, the patient was given instructions  to immediately contact us, at any time, without hesitation. In any case, we plan to contact the patient by telephone for a follow-up status report regarding this interventional procedure.  Comments:  No additional relevant information.  Plan of Care  Orders:  Orders Placed This Encounter  Procedures   LUMBAR FACET(MEDIAL BRANCH NERVE BLOCK) MBNB    Scheduling Instructions:     Procedure: Lumbar facet block (AKA.: Lumbosacral medial branch nerve block)     Side: Left-sided     Level: L3-4, L4-5, & L5-S1 Facets (L2, L3, L4, L5, & S1 Medial Branch Nerves)     Sedation: Patient's choice.     Timeframe: Today    Order Specific Question:   Where will this procedure be performed?    Answer:   [facility]   DG PA[facility] C-ARM 1-60 MIN NO REPORT    Intraoperative interpretation by procedural physician at [facility].    Standing Status:   Standing    Number of Occurrences:   1    Order Specific Question:   Reason for exam:    Answer:   Assistance in needle guidance and placement for procedures requiring needle placement in or near specific anatomical locations not easily accessible without such assistance.   Informed Consent Details: Physician/Practitioner Attestation; Transcribe to consent form and obtain patient signature    Nursing Order: Transcribe to consent form and obtain patient signature. Note: Always confirm laterality of pain with Ms. Janee Morn, before procedure.    Order Specific Question:   Physician/Practitioner attestation of informed consent for procedure/surgical case    Answer:   I, the physician/practitioner, attest that I have discussed with the patient the benefits, risks, side effects, alternatives, likelihood of achieving goals and potential problems during recovery for the procedure that I have provided informed consent.    Order Specific Question:   Procedure    Answer:   Lumbar Facet Block  under fluoroscopic guidance    Order Specific Question:    Physician/Practitioner performing the procedure    Answer:   Ab Leaming A. Laban Emperor MD    Order Specific Question:   Indication/Reason    Answer:   Low Back Pain, with our without leg pain, due to Facet Joint Arthralgia (Joint Pain) Spondylosis (Arthritis of the Spine), without myelopathy or radiculopathy (Nerve Damage).   Provide equipment / supplies at bedside    "Block Tray" (Disposable  single use) Needle type: SpinalSpinal Amount/quantity: 4 Size: Regular (3.5-inch) Gauge: 22G    Standing Status:   Standing    Number of Occurrences:   1    Order Specific Question:   Specify    Answer:   Block Tray    Chronic Opioid Analgesic:  No chronic opioid analgesics therapy prescribed by our practice. Hydrocodone/APAP 7.5/325, 1 tab p.o. 4 times daily (30 mg/day of hydrocodone) (filled on 02/07/2021) (22.5 MME) (prescribed by Melany Guernsey, ANP.) MME/day: 30 mg/day   Medications ordered for procedure: Meds ordered this encounter  Medications   lidocaine (XYLOCAINE) 2 % (with pres) injection 400 mg   lactated ringers infusion 1,000 mL   midazolam (VERSED) 5 MG/5ML injection 1 mg    Make sure Flumazenil is available in the pyxis when using this medication. If oversedation occurs, administer 0.2 mg IV over 15 sec. If after 45 sec no response, administer 0.2 mg again over 1 min; may repeat at 1 min intervals; not to exceed 4 doses (1 mg)   ropivacaine (PF) 2 mg/mL (0.2%) (NAROPIN) injection 9 mL   triamcinolone acetonide (KENALOG-40) injection 40 mg    Medications administered: We administered lidocaine, lactated ringers, midazolam, ropivacaine (PF) 2 mg/mL (0.2%), and triamcinolone acetonide.  See the medical record for exact dosing, route, and time of administration.  Follow-up plan:   Return  in about 2 weeks (around 07/03/2021) for Proc-day (T,Th), (F2F), (PPE).      Interventional Therapies  Risk  Complexity Considerations:   Estimated body mass index is 24.61 kg/m as  calculated from the following:   Height as of this encounter: 5' (1.524 m).   Weight as of this encounter: 126 lb (57.2 kg). WNL   Planned  Pending:   Diagnostic left lumbar facet block #2 (06/19/2021)    Under consideration:   Diagnostic right L2 and L3 transforaminal ESI #1  Possible left suprascapular nerve RFA #1  Diagnostic right IA glenohumeral and AC joint injection #1    Completed:   Diagnostic bilateral lumbar facet MBB x1 (01/18/2021) (5-0) (0/0/0/0)  Diagnostic/therapeutic left T12 and L1 TFESI x1 (03/20/2021) (100/100/0/0)  Diagnostic/therapeutic bilateral L3 TFESI x1 (02/13/2021) (50/50/0/0) Diagnostic/therapeutic left L4-5 LESI x1 (02/13/2021) (50/50/0/0)  Diagnostic/therapeutic left suprascapular NB x1 (05/17/2021) (100/50/10/0)   Therapeutic  Palliative (PRN) options:   None established    Recent Visits Date Type Provider Dept  06/05/21 Office Visit Delano Metz, MD [facility]  05/17/21 Procedure visit Delano Metz, MD [facility]  04/24/21 Office Visit Delano Metz, MD [facility]  Showing recent visits within past 90 days and meeting all other requirements Today's Visits Date Type Provider Dept  06/19/21 Procedure visit Delano Metz, MD [facility]  Showing today's visits and meeting all other requirements Future Appointments Date Type Provider Dept  07/03/21 Appointment Delano Metz, MD [facility]  Showing future appointments within next 90 days and meeting all other requirements Disposition: Discharge home  Discharge (Date  Time): 06/19/2021; 1021 hrs.   Primary Care Physician: Doreene Nest, NP Location: [facility] Note by: Oswaldo Done, MD Date: 06/19/2021; Time: 11:14 AM  Disclaimer:  Medicine is not an Visual merchandiser. The only guarantee in medicine is that nothing is guaranteed. It is important to note that the decision to  proceed with this intervention was based on the information collected from the patient. The Data and conclusions were drawn from the patient's questionnaire, the interview, and the physical examination. Because the information was provided in large part by the patient, it cannot be guaranteed that it has not been purposely or unconsciously manipulated. Every effort has been made to obtain as much relevant data as possible for this evaluation. It is important to note that the conclusions that lead to this procedure are derived in large part from the available data. Always take into account that the treatment will also be dependent on availability of resources and existing treatment guidelines, considered by other Pain Management Practitioners as being common knowledge and practice, at the time of the intervention. For Medico-Legal purposes, it is also important to point out that variation in procedural techniques and pharmacological choices are the acceptable norm. The indications, contraindications, technique, and results of the above procedure should only be interpreted and judged by a Board-Certified Interventional Pain Specialist with extensive familiarity and expertise in the same exact procedure and technique.

## 2021-06-19 NOTE — Patient Instructions (Signed)

## 2021-06-20 ENCOUNTER — Encounter: Payer: Self-pay | Admitting: Dermatology

## 2021-06-20 ENCOUNTER — Telehealth: Payer: Self-pay | Admitting: *Deleted

## 2021-06-20 NOTE — Telephone Encounter (Signed)
Called patient to let her know what Dr. Georgeann Oppenheim recommendations were and she agreed. Patient will be going to [facility] to pick up her supplies and then drop it off when done.

## 2021-06-20 NOTE — Telephone Encounter (Signed)
No problems post procedure. 

## 2021-06-21 ENCOUNTER — Ambulatory Visit: Payer: Medicare Other | Admitting: Dermatology

## 2021-07-02 ENCOUNTER — Other Ambulatory Visit: Payer: Self-pay

## 2021-07-02 ENCOUNTER — Ambulatory Visit (INDEPENDENT_AMBULATORY_CARE_PROVIDER_SITE_OTHER): Payer: Medicare Other | Admitting: Dermatology

## 2021-07-02 ENCOUNTER — Encounter: Payer: Self-pay | Admitting: Dermatology

## 2021-07-02 DIAGNOSIS — A0472 Enterocolitis due to Clostridium difficile, not specified as recurrent: Secondary | ICD-10-CM | POA: Diagnosis not present

## 2021-07-02 DIAGNOSIS — L821 Other seborrheic keratosis: Secondary | ICD-10-CM | POA: Diagnosis not present

## 2021-07-02 DIAGNOSIS — L570 Actinic keratosis: Secondary | ICD-10-CM

## 2021-07-02 DIAGNOSIS — Z85828 Personal history of other malignant neoplasm of skin: Secondary | ICD-10-CM

## 2021-07-02 NOTE — Patient Instructions (Addendum)

## 2021-07-02 NOTE — Progress Notes (Signed)
   Follow-Up Visit   Subjective  [name] is a 85 y.o. female who presents for the following: Follow-up (2 weeks f/u right neck postauricular BCC excised 06/12/21). She has spots on her right forehead and right arm she would like checked.  The following portions of the chart were reviewed this encounter and updated as appropriate:   Tobacco  Allergies  Meds  Problems  Med Hx  Surg Hx  Fam Hx     Review of Systems:  No other skin or systemic complaints except as noted in HPI or Assessment and Plan.  Objective  Well appearing patient in no apparent distress; mood and affect are within normal limits.  A focused examination was performed including face,neck. Relevant physical exam findings are noted in the Assessment and Plan.  right postauricular neck Crusty scab   right forehead above brow Erythematous thin papules/macules with gritty scale.   Right Forearm - Anterior Stuck-on, waxy, tan-brown papule or plaque --Discussed benign etiology and prognosis.    Assessment & Plan  History of basal cell carcinoma (BCC) right postauricular neck  Post excision of BCC at right postauricular- healing well, no sign of recurrence  Debridement with Puracyn spray, applied Mupirocin ointment and a band aid   AK (actinic keratosis) right forehead above brow  Destruction of lesion - right forehead above brow Complexity: simple   Destruction method: cryotherapy   Informed consent: discussed and consent obtained   Timeout:  patient name, date of birth, surgical site, and procedure verified Lesion destroyed using liquid nitrogen: Yes   Region frozen until ice ball extended beyond lesion: Yes   Outcome: patient tolerated procedure well with no complications   Post-procedure details: wound care instructions given    Inflamed seborrheic keratosis Right Forearm - Anterior Reassured benign age-related growth.  Recommend observation.  Discussed cryotherapy if spot(s) become  irritated or inflamed.  Pt declined treatment today  Return in about 4 months (around 10/30/2021) for [facility] [place] .  I, Marye Round, CMA, am acting as scribe for Sarina Ser, MD .  Documentation: I have reviewed the above documentation for accuracy and completeness, and I agree with the above.  Sarina Ser, MD

## 2021-07-03 ENCOUNTER — Telehealth: Payer: Self-pay

## 2021-07-03 ENCOUNTER — Ambulatory Visit: Payer: Medicare Other | Attending: Pain Medicine | Admitting: Pain Medicine

## 2021-07-03 ENCOUNTER — Encounter: Payer: Self-pay | Admitting: Pain Medicine

## 2021-07-03 DIAGNOSIS — W19XXXA Unspecified fall, initial encounter: Secondary | ICD-10-CM | POA: Diagnosis not present

## 2021-07-03 DIAGNOSIS — S32010S Wedge compression fracture of first lumbar vertebra, sequela: Secondary | ICD-10-CM | POA: Diagnosis not present

## 2021-07-03 DIAGNOSIS — S32030S Wedge compression fracture of third lumbar vertebra, sequela: Secondary | ICD-10-CM

## 2021-07-03 DIAGNOSIS — M545 Low back pain, unspecified: Secondary | ICD-10-CM

## 2021-07-03 DIAGNOSIS — M8008XA Age-related osteoporosis with current pathological fracture, vertebra(e), initial encounter for fracture: Secondary | ICD-10-CM

## 2021-07-03 DIAGNOSIS — G8929 Other chronic pain: Secondary | ICD-10-CM

## 2021-07-03 DIAGNOSIS — S22080S Wedge compression fracture of T11-T12 vertebra, sequela: Secondary | ICD-10-CM

## 2021-07-03 DIAGNOSIS — S32040S Wedge compression fracture of fourth lumbar vertebra, sequela: Secondary | ICD-10-CM

## 2021-07-03 LAB — CLOSTRIDIUM DIFFICILE BY PCR: Toxigenic C. Difficile by PCR: POSITIVE — AB

## 2021-07-03 NOTE — Telephone Encounter (Signed)
LM for patient to call office for pre virtual appointment questions.

## 2021-07-03 NOTE — Progress Notes (Signed)
Patient: [name]  Service Category: E/M  Provider: Gaspar Cola, MD  DOB: 11-Jan-1936  DOS: 07/03/2021  Location: Office  MRN: [id number]  Setting: Ambulatory outpatient  Referring Provider: [name], NP  Type: Established Patient  Specialty: Interventional Pain Management  PCP: [name], NP  Location: Remote location  Delivery: TeleHealth     Virtual Encounter - Pain Management PROVIDER NOTE: Information contained herein reflects review and annotations entered in association with encounter. Interpretation of such information and data should be left to medically-trained personnel. Information provided to patient can be located elsewhere in the medical record under "Patient Instructions". Document created using STT-dictation technology, any transcriptional errors that may result from process are unintentional.    Contact & Pharmacy Preferred: [phone] Home: [phone] (home) Mobile: [phone] (mobile) E-mail: [email or web address]  [id number] [id number]: [phone]: [phone]  Pre-screening  Ms. Terri Silosoffered "in-person" vs "virtual" encounter. She indicated preferring virtual for this encounter.   Reason COVID-19*  Social distancing based on CDC and AMA recommendations.   I contacted [name] 07/03/2021 via telephone.      I clearly identified myself as FGaspar Cola MD. I verified that I was speaking with the correct person using two identifiers (Name: [name] and date of birth: [birth year].  Consent I sought verbal advanced consent from [name] virtual visit interactions. I informed [name] possible security and privacy concerns, risks, and limitations associated with providing "not-in-person" medical evaluation and management services. I also informed Ms. TEyeof the availability of "in-person"  appointments. Finally, I informed her that there would be a charge for the virtual visit and that she could be  personally, fully or partially, financially responsible for it. [name] understanding and agreed to proceed.   Historic Elements   Ms. SDANN VENTRESSis a 85y.o. year old, female patient evaluated today after our last contact on 06/19/2021. [name] has a past medical history of Altered mental state, Anxiety, Basal cell carcinoma (05/03/2021), BMI 30.0-30.9,adult, Cellulitis, Chest pain, atypical, Compression fracture of L4 lumbar vertebra, Constipation, Cystitis, Cystocele, Diverticulosis, Fatigue, Fibrosis, idiopathic pulmonary ([place], Gastroenteritis, History of back surgery, History of herniated intervertebral disc, HTN (hypertension), Hypercholesteremia, Hypocalcemia, Incontinence of urine, OA (osteoarthritis), Obesity, Osteopenia, Pneumonia, Shortness of breath, Sleep apnea, Squamous cell carcinoma of skin (07/16/2016), Squamous cell carcinoma of skin (01/13/2018), Thrush, and Vitamin D deficiency. She also  has a past surgical history that includes Rotator cuff repair (Bilateral); Total shoulder replacement (Left); Cataract extraction w/PHACO (Left, 04/24/2016); Cataract extraction w/PHACO (Right, 07/14/2017); Esophagogastroduodenoscopy (egd) with propofol (N/A, 10/13/2018); Flexible sigmoidoscopy (N/A, 10/13/2018); Cholecystectomy; Tonsillectomy; Back surgery; Flexible sigmoidoscopy (N/A, 09/09/2019); Flexible sigmoidoscopy (N/A, 08/04/2020); Hip Arthroplasty (Right, 09/27/2020); and Cystoscopy/ureteroscopy/holmium laser/stent placement (Left, 04/27/2021). [name] a current medication list which includes the following prescription(s): atorvastatin, budesonide, calcium carbonate, vitamin d3, citalopram, denosumab, esbriet, magnesium oxide, metoprolol succinate, mupirocin ointment, oxygen-helium, sulfasalazine, vitamin b-12, and voltaren. She  reports that she has never  smoked. She has never used smokeless tobacco. She reports that she does not drink alcohol and does not use drugs. [name] No Known Allergies.   HPI  Today, she is being contacted for a post-procedure assessment.  According to the patient, for the first hour after the procedure she was having absolutely no pain (100% relief).  However, after that I will work the pain  then started coming back.  Unfortunately, she had the procedure done on 06/19/2021 and on 06/28/2021 she fell in her bathroom hitting her back.  Since then she has been having an increase in her tailbone pain and she has also continued to have pain in the lower back.  Today we will be ordering an x-ray of the lumbar spine since the patient has a history of multiple compression fractures and osteoporosis.  Post-Procedure Evaluation  Procedure (06/19/2021):  Procedure:           Anesthesia, Analgesia, Anxiolysis:  Type: Lumbar Facet, Medial Branch Block(s) #2  Primary Purpose: Diagnostic Region: Posterolateral Lumbosacral Spine Level: L2, L3, L4, L5, & S1 Medial Branch Level(s). Injecting these levels blocks the L3-4, L4-5, and L5-S1 lumbar facet joints. Laterality: Left   Type: Local Anesthesia Local Anesthetic: Lidocaine 1-2% Sedation: Minimal Anxiolysis  Indication(s): Anxiety & Analgesia Route: Infiltration ([name]) IV Access: Available     Position: Prone    Indications: 1. Lumbar facet syndrome   2. Spondylosis without myelopathy or radiculopathy, lumbosacral region   3. DDD (degenerative disc disease), lumbosacral   4. Chronic low back pain (1ry area of Pain) (Bilateral) (L>R) w/o sciatica   5. Lumbosacral facet arthropathy (Multilevel) (Bilateral)     Pain Score: Pre-procedure: 6 /10 Post-procedure: 6 /10   Anxiolysis: Please see nurses note.  Effectiveness during initial hour after procedure (Ultra-Short Term Relief): 0 %.  Local anesthetic used: Long-acting (4-6 hours) Effectiveness: Defined as any  analgesic benefit obtained secondary to the administration of local anesthetics. This carries significant diagnostic value as to the etiological location, or anatomical origin, of the pain. Duration of benefit is expected to coincide with the duration of the local anesthetic used.  Effectiveness during initial 4-6 hours after procedure (Short-Term Relief): 20 %.  Long-term benefit: Defined as any relief past the pharmacologic duration of the local anesthetics.  Effectiveness past the initial 6 hours after procedure (Long-Term Relief): 0 %.  Benefits, current: Defined as benefit present at the time of this evaluation.   Analgesia: According to the patient, on 06/28/2021 she suffered a fall in the bathroom and currently her pain seems to have worsened.  Today we will be ordering some x-rays of the lumbar spine since he is at high risk for possible fractures and she indicates not having gone to an urgent care or an emergency facility after the fall. Function: No improvement ROM: No improvement  Pharmacotherapy Assessment   Analgesic: No chronic opioid analgesics therapy prescribed by our practice. Hydrocodone/APAP 7.5/325, 1 tab p.o. 4 times daily (30 mg/day of hydrocodone) (filled on 02/07/2021) (22.5 MME) (prescribed by Danise Mina, ANP.) MME/day: 30 mg/day   Monitoring: [place] PMP: PDMP reviewed during this encounter.       Pharmacotherapy: No side-effects or adverse reactions reported. Compliance: No problems identified. Effectiveness: Clinically acceptable. Plan: Refer to "POC". UDS:  Summary  Date Value Ref Range Status  01/16/2021 Note  Final    Comment:    ==================================================================== Compliance Drug Analysis, Ur ==================================================================== Test                             Result       Flag       Units  Drug Present and Declared for Prescription Verification   Hydrocodone                     1275  EXPECTED   ng/mg creat   Dihydrocodeine                 287          EXPECTED   ng/mg creat   Norhydrocodone                 >3448        EXPECTED   ng/mg creat    Sources of hydrocodone include scheduled prescription medications.    Dihydrocodeine and norhydrocodone are expected metabolites of    hydrocodone. Dihydrocodeine is also available as a scheduled    prescription medication.    Citalopram                     PRESENT      EXPECTED   Desmethylcitalopram            PRESENT      EXPECTED    Desmethylcitalopram is an expected metabolite of citalopram or the    enantiomeric form, escitalopram.    Acetaminophen                  PRESENT      EXPECTED  Drug Present not Declared for Prescription Verification   Salicylate                     PRESENT      UNEXPECTED  Drug Absent but Declared for Prescription Verification   Diclofenac                     Not Detected UNEXPECTED    Diclofenac, as indicated in the declared medication list, is not    always detected even when used as directed.  ==================================================================== Test                      Result    Flag   Units      Ref Range   Creatinine              145              mg/dL      >=20 ==================================================================== Declared Medications:  The flagging and interpretation on this report are based on the  following declared medications.  Unexpected results may arise from  inaccuracies in the declared medications.   **Note: The testing scope of this panel includes these medications:   Citalopram (Celexa)  Hydrocodone (Norco)   **Note: The testing scope of this panel does not include small to  moderate amounts of these reported medications:   Acetaminophen (Norco)  Diclofenac (Voltaren)   **Note: The testing scope of this panel does not include the  following reported medications:   Cholecalciferol  Cyanocobalamin  Denosumab  (Prolia)  Helium  Oxygen  Pirfenidone (Esbriet)  Sulfasalazine ==================================================================== For clinical consultation, please call [phone]. ====================================================================      Laboratory Chemistry Profile   Renal Lab Results  Component Value Date   BUN 24 (H) 04/21/2021   CREATININE 0.73 04/21/2021   BCR 23 09/21/2020   GFR 75.35 10/24/2020   GFRAA 76 09/21/2020   GFRNONAA >60 04/21/2021    Hepatic Lab Results  Component Value Date   AST 36 04/21/2021   ALT 25 04/21/2021   ALBUMIN 3.8 04/21/2021   ALKPHOS 69 04/21/2021   LIPASE 35 04/21/2021    Electrolytes Lab Results  Component Value Date   NA 139 04/21/2021   K 4.4  04/21/2021   CL 106 04/21/2021   CALCIUM 9.2 04/21/2021   MG 2.1 09/29/2020   PHOS 3.3 05/28/2020    Bone Lab Results  Component Value Date   VD25OH 37.79 10/24/2020   25OHVITD1 42 01/03/2021   25OHVITD2 <1.0 01/03/2021   25OHVITD3 42 01/03/2021    Inflammation (CRP: Acute Phase) (ESR: Chronic Phase) Lab Results  Component Value Date   CRP <1 01/03/2021   ESRSEDRATE 13 01/03/2021         Note: Above Lab results reviewed.  Imaging  DG PAIN CLINIC C-ARM 1-60 MIN NO REPORT Fluoro was used, but no Radiologist interpretation will be provided.  Please refer to "NOTES" tab for provider progress note.  Assessment  The primary encounter diagnosis was Acute exacerbation of chronic low back pain. Diagnoses of Fall, initial encounter, Chronic low back pain (1ry area of Pain) (Bilateral) (L>R) w/o sciatica, Compression fracture of L1 lumbar vertebra, sequela, Compression fracture of L3 lumbar vertebra, sequela, Compression fracture of L4 lumbar vertebra, sequela, Osteoporosis with pathological fracture of lumbar vertebra (HCC), and T12 compression fracture, sequela were also pertinent to this visit.  Plan of Care  Problem-specific:  No problem-specific Assessment &  Plan notes found for this encounter.  [name] has a current medication list which includes the following long-term medication(s): atorvastatin, calcium carbonate, citalopram, metoprolol succinate, and sulfasalazine.  Pharmacotherapy (Medications Ordered): No orders of the defined types were placed in this encounter.  Orders:  Orders Placed This Encounter  Procedures   DG Lumbar Spine Complete W/Bend    Patient presents with axial pain with possible radicular component. Please assist [place] in identifying specific level(s) and laterality of any additional findings such as: 1. Facet (Zygapophyseal) joint DJD (Hypertrophy, space narrowing, subchondral sclerosis, and/or osteophyte formation) 2. DDD and/or IVDD (Loss of disc height, desiccation, gas patterns, osteophytes, endplate sclerosis, or "Black disc disease") 3. Pars defects 4. Spondylolisthesis, spondylosis, and/or spondyloarthropathies (include Degree/Grade of displacement in mm) (stability) 5. Vertebral body Fractures (acute/chronic) (state percentage of collapse) 6. Demineralization (osteopenia/osteoporotic) 7. Bone pathology 8. Foraminal narrowing  9. Surgical changes    Standing Status:   Future    Standing Expiration Date:   08/02/2021    Scheduling Instructions:     Imaging must be done as soon as possible. Inform patient that order will expire within 30 days and I will not renew it.    Order Specific Question:   Reason for Exam (SYMPTOM  OR DIAGNOSIS REQUIRED)    Answer:   Low back pain    Order Specific Question:   Preferred imaging location?    Answer:   [facility]    Order Specific Question:   Call Results- Best Contact Number?    Answer:   [phone] ([facility])    Order Specific Question:   Radiology Contrast Protocol - do NOT remove file path    Answer:   [email or web address]    Order Specific Question:   Release to patient    Answer:   Immediate     Follow-up plan:   Return for Eval-day (M,W), (F2F), for review of ordered tests.     Interventional Therapies  Risk  Complexity Considerations:   Estimated body mass index is 24.61 kg/m as calculated from the following:   Height as of this encounter: 5' (1.524 m).   Weight as of this encounter: 126 lb (57.2 kg). WNL   Planned  Pending:   Diagnostic left lumbar facet block #2 (06/19/2021)  Under consideration:   Diagnostic right L2 and L3 transforaminal ESI #1  Possible left suprascapular nerve RFA #1  Diagnostic right IA glenohumeral and AC joint injection #1    Completed:   Diagnostic bilateral lumbar facet MBB x2 (06/19/2021) (0/20/0/0)  Diagnostic/therapeutic left T12 & L1 TFESI x1 (03/20/2021) (100/100/0/0)  Diagnostic/therapeutic bilateral L3 TFESI x1 (02/13/2021) (50/50/0/0) Diagnostic/therapeutic left L4-5 LESI x1 (02/13/2021) (50/50/0/0)  Diagnostic/therapeutic left suprascapular NB x1 (05/17/2021) (100/50/10/0)   Therapeutic  Palliative (PRN) options:   None established    Recent Visits Date Type Provider Dept  06/19/21 Procedure visit Milinda Pointer, MD [facility]  06/05/21 Office Visit Milinda Pointer, MD [facility]  05/17/21 Procedure visit Milinda Pointer, MD [facility]  04/24/21 Office Visit Milinda Pointer, MD [facility]  Showing recent visits within past 90 days and meeting all other requirements Today's Visits Date Type Provider Dept  07/03/21 Office Visit Milinda Pointer, MD [facility]  Showing today's visits and meeting all other requirements Future Appointments No visits were found meeting these conditions. Showing future appointments within next 90 days and meeting all other requirements I discussed the assessment and treatment plan with the patient. The patient was provided an opportunity to ask questions and all were answered. The patient agreed with the plan and demonstrated  an understanding of the instructions.  Patient advised to call back or seek an in-person evaluation if the symptoms or condition worsens.  Duration of encounter: 16 minutes.  Note by: Gaspar Cola, MD Date: 07/03/2021; Time: 2:57 PM

## 2021-07-04 ENCOUNTER — Ambulatory Visit
Admission: RE | Admit: 2021-07-04 | Discharge: 2021-07-04 | Disposition: A | Discharge status: Home / Self Care | Payer: Medicare Other | Source: Ambulatory Visit | Attending: Pain Medicine | Admitting: Pain Medicine

## 2021-07-04 ENCOUNTER — Telehealth: Payer: Self-pay

## 2021-07-04 DIAGNOSIS — M8008XS Age-related osteoporosis with current pathological fracture, vertebra(e), sequela: Secondary | ICD-10-CM | POA: Insufficient documentation

## 2021-07-04 DIAGNOSIS — S32040S Wedge compression fracture of fourth lumbar vertebra, sequela: Secondary | ICD-10-CM | POA: Diagnosis not present

## 2021-07-04 DIAGNOSIS — G8929 Other chronic pain: Secondary | ICD-10-CM

## 2021-07-04 DIAGNOSIS — S32010S Wedge compression fracture of first lumbar vertebra, sequela: Secondary | ICD-10-CM | POA: Diagnosis not present

## 2021-07-04 DIAGNOSIS — S32030S Wedge compression fracture of third lumbar vertebra, sequela: Secondary | ICD-10-CM | POA: Diagnosis not present

## 2021-07-04 DIAGNOSIS — M47816 Spondylosis without myelopathy or radiculopathy, lumbar region: Secondary | ICD-10-CM | POA: Diagnosis not present

## 2021-07-04 DIAGNOSIS — M47817 Spondylosis without myelopathy or radiculopathy, lumbosacral region: Secondary | ICD-10-CM | POA: Diagnosis not present

## 2021-07-04 DIAGNOSIS — M545 Low back pain, unspecified: Secondary | ICD-10-CM

## 2021-07-04 DIAGNOSIS — M4180 Other forms of scoliosis, site unspecified: Secondary | ICD-10-CM | POA: Diagnosis not present

## 2021-07-04 DIAGNOSIS — S22080S Wedge compression fracture of T11-T12 vertebra, sequela: Secondary | ICD-10-CM | POA: Diagnosis not present

## 2021-07-04 DIAGNOSIS — W19XXXS Unspecified fall, sequela: Secondary | ICD-10-CM | POA: Insufficient documentation

## 2021-07-04 DIAGNOSIS — M8008XA Age-related osteoporosis with current pathological fracture, vertebra(e), initial encounter for fracture: Secondary | ICD-10-CM

## 2021-07-04 DIAGNOSIS — R296 Repeated falls: Secondary | ICD-10-CM | POA: Diagnosis present

## 2021-07-04 DIAGNOSIS — W19XXXA Unspecified fall, initial encounter: Secondary | ICD-10-CM

## 2021-07-04 LAB — GI PROFILE, STOOL, PCR

## 2021-07-04 MED ORDER — FIDAXOMICIN 200 MG PO TABS: 200.0000 mg | ORAL_TABLET | Freq: Two times a day (BID) | ORAL | 0 refills | Status: DC

## 2021-07-04 NOTE — Telephone Encounter (Signed)
-----   Message from [name], MD sent at 07/04/2021  8:30 AM EDT ----- Inform has C diff - send dificid for 10 days

## 2021-07-04 NOTE — Progress Notes (Signed)
Inform has C diff - send dificid for 10 days

## 2021-07-04 NOTE — Telephone Encounter (Signed)
Called patient to let her know that her stool sample showed that she had positive CDiff and that Dr. Vicente Males wants her to start taking Dificid for 10 days. Patient agreed and had no further questions.

## 2021-07-05 ENCOUNTER — Telehealth: Payer: Self-pay | Admitting: Gastroenterology

## 2021-07-05 NOTE — Telephone Encounter (Signed)
Pt. Requesting call back. She says the medicine that was prescribed to her is too expensive.

## 2021-07-05 NOTE — Telephone Encounter (Signed)
Pt. Requesting a call back she said she needs more information to tell patient assistance program

## 2021-07-05 NOTE — Telephone Encounter (Signed)
Called patient back and I gave her the number for her to call [employer] patient assistance so she could ask if she qualifies. Patient stated that she would call them. I also told her that if she needed further help, to call me. Patient understood and had no further questions at this time.

## 2021-07-06 ENCOUNTER — Telehealth: Payer: Self-pay

## 2021-07-06 DIAGNOSIS — H02841 Edema of right upper eyelid: Secondary | ICD-10-CM | POA: Diagnosis not present

## 2021-07-06 NOTE — Telephone Encounter (Signed)
Patient came in to the office with patient assistance paperwork for [place] to fill out and then fax to the company. I told her that I would fill out and then fax to medication company. Patient had no further questions.

## 2021-07-06 NOTE — Telephone Encounter (Signed)
Dr. Vicente Males, patient called wanting to know if you still want her to continue taking Sulfasalazine. Please advise.

## 2021-07-08 ENCOUNTER — Other Ambulatory Visit: Payer: Self-pay | Admitting: Primary Care

## 2021-07-08 DIAGNOSIS — F419 Anxiety disorder, unspecified: Secondary | ICD-10-CM

## 2021-07-09 ENCOUNTER — Telehealth: Payer: Self-pay | Admitting: Pain Medicine

## 2021-07-09 ENCOUNTER — Other Ambulatory Visit: Payer: Self-pay

## 2021-07-09 MED ORDER — SULFASALAZINE 500 MG PO TABS: 500.0000 mg | ORAL_TABLET | Freq: Two times a day (BID) | ORAL | 3 refills | Status: DC

## 2021-07-09 NOTE — Telephone Encounter (Signed)
Patient is calling to check on xray results? She knows Dr. Dossie Arbour is out until next week, wants to know if another phys can look at it and have a nurse call with results. Please let patient know status of this request or results. Thank you

## 2021-07-09 NOTE — Telephone Encounter (Signed)
X-ray results read to patient.

## 2021-07-10 ENCOUNTER — Telehealth: Payer: Self-pay | Admitting: Gastroenterology

## 2021-07-10 NOTE — Telephone Encounter (Signed)
Patient was called and informed her that she is to continue taking her Sulfasalazine. Patient agreed and had no further questions.

## 2021-07-11 NOTE — Telephone Encounter (Signed)
Called patient to let her know that [employer] will be shipping her medication today and receiving it tomorrow. Patient was happy to hear and had no further questions.

## 2021-07-12 ENCOUNTER — Telehealth: Payer: Self-pay | Admitting: Pain Medicine

## 2021-07-12 NOTE — Telephone Encounter (Signed)
She has to have an appointment to get the procedure scheduled.  Please schedule her an appointment as soon as available.

## 2021-07-13 ENCOUNTER — Telehealth: Payer: Self-pay | Admitting: Primary Care

## 2021-07-13 NOTE — Chronic Care Management (AMB) (Signed)
  Chronic Care Management   Outreach Note  07/13/2021 Name: [name] MRN: [id number] DOB: Jan 27, 1936  Referred by: [name], NP Reason for referral : No chief complaint on file.   A second unsuccessful telephone outreach was attempted today. The patient was referred to pharmacist for assistance with care management and care coordination.  Follow Up Plan:   [name] Upstream Scheduler

## 2021-07-15 ENCOUNTER — Other Ambulatory Visit: Payer: Self-pay | Admitting: Pain Medicine

## 2021-07-15 DIAGNOSIS — M8008XA Age-related osteoporosis with current pathological fracture, vertebra(e), initial encounter for fracture: Secondary | ICD-10-CM

## 2021-07-15 DIAGNOSIS — M8000XS Age-related osteoporosis with current pathological fracture, unspecified site, sequela: Secondary | ICD-10-CM

## 2021-07-16 ENCOUNTER — Telehealth: Payer: Self-pay | Admitting: Primary Care

## 2021-07-16 NOTE — Progress Notes (Signed)
  Chronic Care Management   Note  07/16/2021 Name: EMERA BUSSIE MRN: [id number] DOB: 1935/09/26  CATERIN TABARES is a 85 y.o. year old female who is a primary care patient of [name], NP. I reached out to [name] by phone today in response to a referral sent by Ms. [name] Difabio's PCP, [name], NP.   Ms. Perham was given information about Chronic Care Management services today including:  CCM service includes personalized support from designated clinical staff supervised by her physician, including individualized plan of care and coordination with other care providers 24/7 contact phone numbers for assistance for urgent and routine care needs. Service will only be billed when office clinical staff spend 20 minutes or more in a month to coordinate care. Only one practitioner may furnish and bill the service in a calendar month. The patient may stop CCM services at any time (effective at the end of the month) by phone call to the office staff.   Patient agreed to services and verbal consent obtained.   Follow up plan:PT [place]

## 2021-07-20 ENCOUNTER — Telehealth: Payer: Self-pay | Admitting: Gastroenterology

## 2021-07-20 NOTE — Telephone Encounter (Signed)
Inbound call from pt requesting a call back to see she can have Dificid filled. Fax [phone]

## 2021-07-21 ENCOUNTER — Other Ambulatory Visit: Payer: Self-pay | Admitting: Pain Medicine

## 2021-07-21 DIAGNOSIS — M8008XA Age-related osteoporosis with current pathological fracture, vertebra(e), initial encounter for fracture: Secondary | ICD-10-CM

## 2021-07-21 DIAGNOSIS — M8000XS Age-related osteoporosis with current pathological fracture, unspecified site, sequela: Secondary | ICD-10-CM

## 2021-07-23 NOTE — Telephone Encounter (Signed)
Called patient back and told her what you had stated, however, patient stated that she continues to have diarrhea. Therefore, she wants a refill on her Dificid if it's appropriate to her. Please advise.

## 2021-07-23 NOTE — Telephone Encounter (Signed)
Dr. Marius Ditch, patient had called twice wanting to know if she should continue taking Dificid or not. Dr. Vicente Males prescribed her for 10 days on 07/04/2021 to be taken for 10 days. However, the patient is calling wanting to know if she is in need of a refill or not. Can you please advise. Thank you.

## 2021-07-23 NOTE — Telephone Encounter (Signed)
2nd time calling-Inbound call from pt requesting a call back to see she can have Dificid filled

## 2021-07-24 ENCOUNTER — Ambulatory Visit: Payer: Medicare Other | Attending: Pain Medicine | Admitting: Pain Medicine

## 2021-07-24 ENCOUNTER — Ambulatory Visit: Payer: Medicare Other | Admitting: Pain Medicine

## 2021-07-24 ENCOUNTER — Encounter: Payer: Self-pay | Admitting: Pain Medicine

## 2021-07-24 ENCOUNTER — Other Ambulatory Visit: Payer: Self-pay

## 2021-07-24 VITALS — BP 128/91 | HR 95 | Temp 96.8°F | Resp 16 | Ht 60.0 in | Wt 125.0 lb

## 2021-07-24 DIAGNOSIS — Z9181 History of falling: Secondary | ICD-10-CM | POA: Diagnosis not present

## 2021-07-24 DIAGNOSIS — Z96612 Presence of left artificial shoulder joint: Secondary | ICD-10-CM | POA: Diagnosis not present

## 2021-07-24 DIAGNOSIS — W19XXXA Unspecified fall, initial encounter: Secondary | ICD-10-CM

## 2021-07-24 DIAGNOSIS — M25551 Pain in right hip: Secondary | ICD-10-CM | POA: Insufficient documentation

## 2021-07-24 DIAGNOSIS — M7918 Myalgia, other site: Secondary | ICD-10-CM | POA: Insufficient documentation

## 2021-07-24 DIAGNOSIS — X58XXXS Exposure to other specified factors, sequela: Secondary | ICD-10-CM | POA: Insufficient documentation

## 2021-07-24 DIAGNOSIS — S32040S Wedge compression fracture of fourth lumbar vertebra, sequela: Secondary | ICD-10-CM | POA: Diagnosis not present

## 2021-07-24 DIAGNOSIS — S22080S Wedge compression fracture of T11-T12 vertebra, sequela: Secondary | ICD-10-CM

## 2021-07-24 DIAGNOSIS — S72011S Unspecified intracapsular fracture of right femur, sequela: Secondary | ICD-10-CM | POA: Insufficient documentation

## 2021-07-24 DIAGNOSIS — M47816 Spondylosis without myelopathy or radiculopathy, lumbar region: Secondary | ICD-10-CM | POA: Diagnosis not present

## 2021-07-24 DIAGNOSIS — Z96641 Presence of right artificial hip joint: Secondary | ICD-10-CM | POA: Insufficient documentation

## 2021-07-24 DIAGNOSIS — M8008XS Age-related osteoporosis with current pathological fracture, vertebra(e), sequela: Secondary | ICD-10-CM | POA: Insufficient documentation

## 2021-07-24 DIAGNOSIS — M778 Other enthesopathies, not elsewhere classified: Secondary | ICD-10-CM | POA: Diagnosis not present

## 2021-07-24 DIAGNOSIS — M4180 Other forms of scoliosis, site unspecified: Secondary | ICD-10-CM | POA: Diagnosis not present

## 2021-07-24 DIAGNOSIS — M25511 Pain in right shoulder: Secondary | ICD-10-CM | POA: Diagnosis not present

## 2021-07-24 DIAGNOSIS — M545 Low back pain, unspecified: Secondary | ICD-10-CM | POA: Diagnosis not present

## 2021-07-24 DIAGNOSIS — G894 Chronic pain syndrome: Secondary | ICD-10-CM | POA: Diagnosis not present

## 2021-07-24 DIAGNOSIS — S32010S Wedge compression fracture of first lumbar vertebra, sequela: Secondary | ICD-10-CM

## 2021-07-24 DIAGNOSIS — M1990 Unspecified osteoarthritis, unspecified site: Secondary | ICD-10-CM | POA: Diagnosis not present

## 2021-07-24 DIAGNOSIS — M5135 Other intervertebral disc degeneration, thoracolumbar region: Secondary | ICD-10-CM | POA: Insufficient documentation

## 2021-07-24 DIAGNOSIS — M47896 Other spondylosis, lumbar region: Secondary | ICD-10-CM | POA: Insufficient documentation

## 2021-07-24 DIAGNOSIS — M5137 Other intervertebral disc degeneration, lumbosacral region: Secondary | ICD-10-CM | POA: Diagnosis not present

## 2021-07-24 DIAGNOSIS — G8929 Other chronic pain: Secondary | ICD-10-CM | POA: Diagnosis not present

## 2021-07-24 DIAGNOSIS — R531 Weakness: Secondary | ICD-10-CM | POA: Diagnosis not present

## 2021-07-24 DIAGNOSIS — M47817 Spondylosis without myelopathy or radiculopathy, lumbosacral region: Secondary | ICD-10-CM | POA: Insufficient documentation

## 2021-07-24 DIAGNOSIS — M8008XA Age-related osteoporosis with current pathological fracture, vertebra(e), initial encounter for fracture: Secondary | ICD-10-CM

## 2021-07-24 DIAGNOSIS — S32030S Wedge compression fracture of third lumbar vertebra, sequela: Secondary | ICD-10-CM | POA: Diagnosis not present

## 2021-07-24 DIAGNOSIS — M25512 Pain in left shoulder: Secondary | ICD-10-CM | POA: Insufficient documentation

## 2021-07-24 DIAGNOSIS — M542 Cervicalgia: Secondary | ICD-10-CM | POA: Diagnosis not present

## 2021-07-24 DIAGNOSIS — R1031 Right lower quadrant pain: Secondary | ICD-10-CM | POA: Diagnosis not present

## 2021-07-24 DIAGNOSIS — Z23 Encounter for immunization: Secondary | ICD-10-CM | POA: Diagnosis not present

## 2021-07-24 NOTE — Progress Notes (Signed)
Safety precautions to be maintained throughout the outpatient stay will include: orient to surroundings, keep bed in low position, maintain call bell within reach at all times, provide assistance with transfer out of bed and ambulation.  

## 2021-07-24 NOTE — Patient Instructions (Signed)
______________________________________________________________________  Preparing for Procedure with Sedation  NOTICE: Due to recent regulatory changes, starting on April 02, 2021, procedures requiring intravenous (IV) sedation will no longer be performed at the [facility].  These types of procedures are required to be performed at [facility] ambulatory surgery facility.  We are very sorry for the inconvenience.  Procedure appointments are limited to planned procedures: No Prescription Refills. No disability issues will be discussed. No medication changes will be discussed.  Instructions: Oral Intake: Do not eat or drink anything for at least 8 hours prior to your procedure. (Exception: Blood Pressure Medication. See below.) Transportation: A driver is required. You may not drive yourself after the procedure. Blood Pressure Medicine: Do not forget to take your blood pressure medicine with a sip of water the morning of the procedure. If your Diastolic (lower reading) is above 100 mmHg, elective cases will be cancelled/rescheduled. Blood thinners: These will need to be stopped for procedures. Notify our staff if you are taking any blood thinners. Depending on which one you take, there will be specific instructions on how and when to stop it. Diabetics on insulin: Notify the staff so that you can be scheduled 1st case in the morning. If your diabetes requires high dose insulin, take only  of your normal insulin dose the morning of the procedure and notify the staff that you have done so. Preventing infections: Shower with an antibacterial soap the morning of your procedure. Build-up your immune system: Take 1000 mg of Vitamin C with every meal (3 times a day) the day prior to your procedure. Antibiotics: Inform the staff if you have a condition or reason that requires you to take antibiotics before dental procedures. Pregnancy: If you are pregnant, call and cancel the procedure. Sickness: If  you have a cold, fever, or any active infections, call and cancel the procedure. Arrival: You must be in the facility at least 30 minutes prior to your scheduled procedure. Children: Do not bring children with you. Dress appropriately: Bring dark clothing that you would not mind if they get stained. Valuables: Do not bring any jewelry or valuables.  Reasons to call and reschedule or cancel your procedure: (Following these recommendations will minimize the risk of a serious complication.) Surgeries: Avoid having procedures within 2 weeks of any surgery. (Avoid for 2 weeks before or after any surgery). Flu Shots: Avoid having procedures within 2 weeks of a flu shots. (Avoid for 2 weeks before or after immunizations). Barium: Avoid having a procedure within 7-10 days after having had a radiological study involving the use of radiological contrast. (Myelograms, Barium swallow or enema study). Heart attacks: Avoid any elective procedures or surgeries for the initial 6 months after a "Myocardial Infarction" (Heart Attack). Blood thinners: It is imperative that you stop these medications before procedures. Let us know if you if you take any blood thinner.  Infection: Avoid procedures during or within two weeks of an infection (including chest colds or gastrointestinal problems). Symptoms associated with infections include: Localized redness, fever, chills, night sweats or profuse sweating, burning sensation when voiding, cough, congestion, stuffiness, runny nose, sore throat, diarrhea, nausea, vomiting, cold or Flu symptoms, recent or current infections. It is specially important if the infection is over the area that we intend to treat. Heart and lung problems: Symptoms that may suggest an active cardiopulmonary problem include: cough, chest pain, breathing difficulties or shortness of breath, dizziness, ankle swelling, uncontrolled high or unusually low blood pressure, and/or palpitations. If you are  experiencing any of these symptoms, cancel your procedure and contact your primary care physician for an evaluation.  Remember:  Regular Business hours are:  Monday to Thursday 8:00 AM to 4:00 PM  Provider's Schedule: Milinda Pointer, MD:  Procedure days: Tuesday and Thursday 7:30 AM to 4:00 PM  Gillis Santa, MD:  Procedure days: Monday and Wednesday 7:30 AM to 4:00 PM ______________________________________________________________________  ____________________________________________________________________________________________  General Risks and Possible Complications  Patient Responsibilities: It is important that you read this as it is part of your informed consent. It is our duty to inform you of the risks and possible complications associated with treatments offered to you. It is your responsibility as a patient to read this and to ask questions about anything that is not clear or that you believe was not covered in this document.  Patient's Rights: You have the right to refuse treatment. You also have the right to change your mind, even after initially having agreed to have the treatment done. However, under this last option, if you wait until the last second to change your mind, you may be charged for the materials used up to that point.  Introduction: Medicine is not an Chief Strategy Officer. Everything in Medicine, including the lack of treatment(s), carries the potential for danger, harm, or loss (which is by definition: Risk). In Medicine, a complication is a secondary problem, condition, or disease that can aggravate an already existing one. All treatments carry the risk of possible complications. The fact that a side effects or complications occurs, does not imply that the treatment was conducted incorrectly. It must be clearly understood that these can happen even when everything is done following the highest safety standards.  No treatment: You can choose not to proceed with the  proposed treatment alternative. The "PRO(s)" would include: avoiding the risk of complications associated with the therapy. The "CON(s)" would include: not getting any of the treatment benefits. These benefits fall under one of three categories: diagnostic; therapeutic; and/or palliative. Diagnostic benefits include: getting information which can ultimately lead to improvement of the disease or symptom(s). Therapeutic benefits are those associated with the successful treatment of the disease. Finally, palliative benefits are those related to the decrease of the primary symptoms, without necessarily curing the condition (example: decreasing the pain from a flare-up of a chronic condition, such as incurable terminal cancer).  General Risks and Complications: These are associated to most interventional treatments. They can occur alone, or in combination. They fall under one of the following six (6) categories: no benefit or worsening of symptoms; bleeding; infection; nerve damage; allergic reactions; and/or death. No benefits or worsening of symptoms: In Medicine there are no guarantees, only probabilities. No healthcare provider can ever guarantee that a medical treatment will work, they can only state the probability that it may. Furthermore, there is always the possibility that the condition may worsen, either directly, or indirectly, as a consequence of the treatment. Bleeding: This is more common if the patient is taking a blood thinner, either prescription or over the counter (example: Goody Powders, Fish oil, Aspirin, Garlic, etc.), or if suffering a condition associated with impaired coagulation (example: Hemophilia, cirrhosis of the liver, low platelet counts, etc.). However, even if you do not have one on these, it can still happen. If you have any of these conditions, or take one of these drugs, make sure to notify your treating physician. Infection: This is more common in patients with a compromised  immune system, either due to disease (example:  diabetes, cancer, human immunodeficiency virus [HIV], etc.), or due to medications or treatments (example: therapies used to treat cancer and rheumatological diseases). However, even if you do not have one on these, it can still happen. If you have any of these conditions, or take one of these drugs, make sure to notify your treating physician. Nerve Damage: This is more common when the treatment is an invasive one, but it can also happen with the use of medications, such as those used in the treatment of cancer. The damage can occur to small secondary nerves, or to large primary ones, such as those in the spinal cord and brain. This damage may be temporary or permanent and it may lead to impairments that can range from temporary numbness to permanent paralysis and/or brain death. Allergic Reactions: Any time a substance or material comes in contact with our body, there is the possibility of an allergic reaction. These can range from a mild skin rash (contact dermatitis) to a severe systemic reaction (anaphylactic reaction), which can result in death. Death: In general, any medical intervention can result in death, most of the time due to an unforeseen complication. ____________________________________________________________________________________________

## 2021-07-24 NOTE — Progress Notes (Signed)
PROVIDER NOTE: Information contained herein reflects review and annotations entered in association with encounter. Interpretation of such information and data should be left to medically-trained personnel. Information provided to patient can be located elsewhere in the medical record under "Patient Instructions". Document created using STT-dictation technology, any transcriptional errors that may result from process are unintentional.    Patient: [name]  Service Category: E/M  Provider: Gaspar Cola, MD  DOB: 1935-10-25  DOS: 07/24/2021  Specialty: Interventional Pain Management  MRN: [id number]  Setting: Ambulatory outpatient  PCP: [name], NP  Type: Established Patient    Referring Provider: [name], NP  Location: Office  Delivery: Face-to-face     HPI  [name], a 85 y.o. year old female, is here today because of her Acute exacerbation of chronic low back pain [M54.50, G89.29]. [name] primary complain today is Back Pain Last encounter: My last encounter with her was on 07/21/2021. Pertinent problems: [name] has Arthritis; Compression fracture of L4 lumbar vertebra, sequela; Osteopetrosis; Subcapital fracture of femur, sequela (Right); Chronic pain syndrome; Chronic groin pain (2ry area of Pain) (Right); Chronic low back pain (1ry area of Pain) (Bilateral) (L>R) w/o sciatica; Chronic hip pain after total replacement (Right); Chronic shoulder pain (3ry area of Pain) (Bilateral) (L>R); Abnormal MRI, lumbar spine (12/06/2020); T12 compression fracture, sequela; Compression fracture of L1 lumbar vertebra, sequela; Compression fracture of L3 lumbar vertebra, sequela; Lumbosacral facet arthropathy (Multilevel) (Bilateral); Lumbar facet syndrome; Spondylosis without myelopathy or radiculopathy, lumbosacral region; DDD (degenerative disc disease), lumbosacral; DDD (degenerative disc disease), thoracolumbar; Chronic radicular pain of lower back;  Lumbar spondylosis; Osteoporosis with current pathological fracture, sequela; Osteoporosis with pathological fracture of lumbar vertebra ([place]); Trigger point of shoulder region (Left); Tendinitis of triceps (Left); Chronic upper back pain; Chronic thoracic back pain (Left); Trigger point with back pain; Lower extremity weakness (Bilateral); Myofascial pain syndrome of lumbar spine; Myofascial pain syndrome of thoracic spine; Cervicalgia; Chronic shoulder pain (Left); History of total shoulder replacement (Left); Pain in shoulder region after shoulder replacement (Left); and History of arthroplasty of shoulder (Left) on their pertinent problem list. Pain Assessment: Severity of Chronic pain is reported as a 8 /10. Location: Back Lower, Right, Left/denies. Onset: More than a month ago. Quality: Constant, Aching (Intense). Timing: Constant. Modifying factor(s): Heating pad. Vitals:  height is 5' (1.524 m) and weight is 125 lb (56.7 kg). Her temporal temperature is 96.8 F (36 C) (abnormal). Her blood pressure is 128/91 (abnormal) and her pulse is 95. Her respiration is 16 and oxygen saturation is 91%.   Reason for encounter: follow-up evaluation after x-rays of the lumbar spine.  X-rays were done to evaluate after a fall that she suffered in the bathroom on 06/28/2021.  She does have a history of multiple compression fractures involving T12, L1, L3, and L4.  No new fractures were observed on the x-rays ordered.  This would suggest myofascial pain after the fall.  The patient has some serious memory issues.  Today I shared the results of the x-rays and I informed her that according to the x-ray she did not have any new fractures.  Since she was doing well prior to the fall, we decided to go ahead and repeat the lumbar facet block to see if we can again get the area to feel better, and hopefully this time she will have another fall.  Pharmacotherapy Assessment  Analgesic: No chronic opioid analgesics therapy  prescribed by our practice. Hydrocodone/APAP 7.5/325, 1  tab p.o. 4 times daily (30 mg/day of hydrocodone) (filled on 02/07/2021) (22.5 MME) (prescribed by Danise Mina, ANP.) MME/day: 30 mg/day   Monitoring: [place] PMP: PDMP reviewed during this encounter.       Pharmacotherapy: No side-effects or adverse reactions reported. Compliance: No problems identified. Effectiveness: Clinically acceptable.  Al Decant, RN  07/24/2021  2:12 PM  Sign when Signing Visit Safety precautions to be maintained throughout the outpatient stay will include: orient to surroundings, keep bed in low position, maintain call bell within reach at all times, provide assistance with transfer out of bed and ambulation.     UDS:  Summary  Date Value Ref Range Status  01/16/2021 Note  Final    Comment:    ==================================================================== Compliance Drug Analysis, Ur ==================================================================== Test                             Result       Flag       Units  Drug Present and Declared for Prescription Verification   Hydrocodone                    1275         EXPECTED   ng/mg creat   Dihydrocodeine                 287          EXPECTED   ng/mg creat   Norhydrocodone                 >3448        EXPECTED   ng/mg creat    Sources of hydrocodone include scheduled prescription medications.    Dihydrocodeine and norhydrocodone are expected metabolites of    hydrocodone. Dihydrocodeine is also available as a scheduled    prescription medication.    Citalopram                     PRESENT      EXPECTED   Desmethylcitalopram            PRESENT      EXPECTED    Desmethylcitalopram is an expected metabolite of citalopram or the    enantiomeric form, escitalopram.    Acetaminophen                  PRESENT      EXPECTED  Drug Present not Declared for Prescription Verification   Salicylate                     PRESENT      UNEXPECTED  Drug  Absent but Declared for Prescription Verification   Diclofenac                     Not Detected UNEXPECTED    Diclofenac, as indicated in the declared medication list, is not    always detected even when used as directed.  ==================================================================== Test                      Result    Flag   Units      Ref Range   Creatinine              145              mg/dL      >=20 ==================================================================== Declared Medications:  The flagging  and interpretation on this report are based on the  following declared medications.  Unexpected results may arise from  inaccuracies in the declared medications.   **Note: The testing scope of this panel includes these medications:   Citalopram (Celexa)  Hydrocodone (Norco)   **Note: The testing scope of this panel does not include small to  moderate amounts of these reported medications:   Acetaminophen (Norco)  Diclofenac (Voltaren)   **Note: The testing scope of this panel does not include the  following reported medications:   Cholecalciferol  Cyanocobalamin  Denosumab (Prolia)  Helium  Oxygen  Pirfenidone (Esbriet)  Sulfasalazine ==================================================================== For clinical consultation, please call [phone]. ====================================================================      ROS  Constitutional: Denies any fever or chills Gastrointestinal: No reported hemesis, hematochezia, vomiting, or acute GI distress Musculoskeletal: Denies any acute onset joint swelling, redness, loss of ROM, or weakness Neurological: No reported episodes of acute onset apraxia, aphasia, dysarthria, agnosia, amnesia, paralysis, loss of coordination, or loss of consciousness  Medication Review  HYDROcodone-acetaminophen, Oxygen-Helium, Pirfenidone, atorvastatin, budesonide, calcium carbonate, citalopram, denosumab, diclofenac Sodium,  metoprolol succinate, mupirocin ointment, sulfaSALAzine, and vitamin B-12  History Review  Allergy: [name] has No Known Allergies. Drug: [name]  reports no history of drug use. Alcohol:  reports no history of alcohol use. Tobacco:  reports that she has never smoked. She has never used smokeless tobacco. Social: [name]  reports that she has never smoked. She has never used smokeless tobacco. She reports that she does not drink alcohol and does not use drugs. 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 Surgical: [name]  has a past surgical history that includes Rotator cuff repair (Bilateral); Total shoulder replacement (Left); Cataract extraction w/PHACO (Left, 04/24/2016); Cataract extraction w/PHACO (Right, 07/14/2017); Esophagogastroduodenoscopy (egd) with propofol (N/A, 10/13/2018); Flexible sigmoidoscopy (N/A, 10/13/2018); Cholecystectomy; Tonsillectomy; Back surgery; Flexible sigmoidoscopy (N/A, 09/09/2019); Flexible sigmoidoscopy (N/A, 08/04/2020); Hip Arthroplasty (Right, 09/27/2020); and Cystoscopy/ureteroscopy/holmium laser/stent placement (Left, 04/27/2021). Family: family history includes COPD in her mother; Cancer in her brother.  Laboratory Chemistry Profile   Renal Lab Results  Component Value Date   BUN 24 (H) 04/21/2021   CREATININE 0.73  04/21/2021   BCR 23 09/21/2020   GFR 75.35 10/24/2020   GFRAA 76 09/21/2020   GFRNONAA >60 04/21/2021    Hepatic Lab Results  Component Value Date   AST 36 04/21/2021   ALT 25 04/21/2021   ALBUMIN 3.8 04/21/2021   ALKPHOS 69 04/21/2021   LIPASE 35 04/21/2021    Electrolytes Lab Results  Component Value Date   NA 139 04/21/2021   K 4.4 04/21/2021   CL 106 04/21/2021   CALCIUM 9.2 04/21/2021   MG 2.1 09/29/2020   PHOS 3.3 05/28/2020    Bone Lab Results  Component Value Date   VD25OH 37.79 10/24/2020   25OHVITD1 42 01/03/2021   25OHVITD2 <1.0 01/03/2021   25OHVITD3 42 01/03/2021    Inflammation (CRP: Acute Phase) (ESR: Chronic Phase) Lab Results  Component Value Date   CRP <1 01/03/2021   ESRSEDRATE 13 01/03/2021  Note: Above Lab results reviewed.  Recent Imaging Review  DG Lumbar Spine Complete W/Bend CLINICAL DATA:  Low back pain  EXAM: LUMBAR SPINE - COMPLETE WITH BENDING VIEWS  COMPARISON:  Lumbar spine x-ray 04/24/2021  FINDINGS: Levoscoliosis of the lumbar spine centered at L3. Stable old compression fractures with vertebroplasty changes at T12, L1 and L4. No acute fracture identified. Minimal grade 1 anterolisthesis of L4 relative to L3 and L5. Bones are osteopenic. No evidence of instability on flexion or extension. Mild to moderate intervertebral disc space narrowing at L3-L4 and mild at L4-L5. Facet arthropathy most significant from L4-S1.  IMPRESSION: 1. Scoliosis and degenerative changes. 2. Old compression fractures with vertebroplasty changes.  Electronically Signed   By: Ofilia Neas M.D.   On: 07/05/2021 09:09 Note: Reviewed        Physical Exam  General appearance: Well nourished, well developed, and well hydrated. In no apparent acute distress Mental status: Alert, oriented x 3 (person, place, & time)       Respiratory: No evidence of acute respiratory distress Eyes: PERLA Vitals: BP (!) 128/91 (BP Location: Right  Arm, Patient Position: Sitting, Cuff Size: Normal)   Pulse 95   Temp (!) 96.8 F (36 C) (Temporal)   Resp 16   Ht 5' (1.524 m)   Wt 125 lb (56.7 kg)   SpO2 91%   BMI 24.41 kg/m  BMI: Estimated body mass index is 24.41 kg/m as calculated from the following:   Height as of this encounter: 5' (1.524 m).   Weight as of this encounter: 125 lb (56.7 kg). Ideal: Ideal body weight: 45.5 kg (100 lb 4.9 oz) Adjusted ideal body weight: 50 kg (110 lb 3 oz)  Assessment   Status Diagnosis  Controlled Controlled Controlled 1. Acute exacerbation of chronic low back pain   2. Fall, initial encounter   3. Chronic low back pain (1ry area of Pain) (Bilateral) (L>R) w/o sciatica   4. Compression fracture of L1 lumbar vertebra, sequela   5. Compression fracture of L3 lumbar vertebra, sequela   6. Compression fracture of L4 lumbar vertebra, sequela   7. Osteoporosis with pathological fracture of lumbar vertebra ([place])   8. T12 compression fracture, sequela   9. Lumbar facet syndrome   10. Spondylosis without myelopathy or radiculopathy, lumbosacral region   11. DDD (degenerative disc disease), lumbosacral      Updated Problems: No problems updated.  Plan of Care  Problem-specific:  No problem-specific Assessment & Plan notes found for this encounter.  [name] has a current medication list which includes the following long-term medication(s): atorvastatin, citalopram, metoprolol succinate, sulfasalazine, and calcium carbonate.  Pharmacotherapy (Medications Ordered): No orders of the defined types were placed in this encounter.  Orders:  Orders Placed This Encounter  Procedures   Lumbar Epidural Injection    Standing Status:   Future    Standing Expiration Date:   10/24/2021    Scheduling Instructions:     Procedure: Interlaminar Lumbar Epidural Steroid injection (LESI)            Laterality: Midline     Sedation: Patient's choice.     Timeframe: ASAA    Order Specific  Question:   Where will this procedure be performed?    Answer:   [facility]    Follow-up plan:   Return for (Clinic) procedure: (B) L-FCT Blk #3, (Sed-anx).     Interventional Therapies  Risk  Complexity Considerations:   Estimated body mass index  is 24.61 kg/m as calculated from the following:   Height as of this encounter: 5' (1.524 m).   Weight as of this encounter: 126 lb (57.2 kg). WNL   Planned  Pending:   Diagnostic bilateral lumbar facet block #3    Under consideration:   Diagnostic right L2 and L3 transforaminal ESI #1  Possible left suprascapular nerve RFA #1  Diagnostic right IA glenohumeral and AC joint injection #1    Completed:   Diagnostic bilateral lumbar facet MBB x2 (06/19/2021) (0/20/0/0)  Diagnostic/therapeutic left T12 & L1 TFESI x1 (03/20/2021) (100/100/0/0)  Diagnostic/therapeutic bilateral L3 TFESI x1 (02/13/2021) (50/50/0/0) Diagnostic/therapeutic left L4-5 LESI x1 (02/13/2021) (50/50/0/0)  Diagnostic/therapeutic left suprascapular NB x1 (05/17/2021) (100/50/10/0)   Therapeutic  Palliative (PRN) options:   None established     Recent Visits Date Type Provider Dept  07/03/21 Office Visit Milinda Pointer, MD [facility]  06/19/21 Procedure visit Milinda Pointer, MD [facility]  06/05/21 Office Visit Milinda Pointer, MD [facility]  05/17/21 Procedure visit Milinda Pointer, MD [facility]  Showing recent visits within past 90 days and meeting all other requirements Today's Visits Date Type Provider Dept  07/24/21 Office Visit Milinda Pointer, MD [facility]  Showing today's visits and meeting all other requirements Future Appointments Date Type Provider Dept  07/31/21 Appointment Milinda Pointer, MD [facility]  Showing future appointments within next 90 days and meeting all other requirements I discussed the assessment and treatment plan with the patient. The  patient was provided an opportunity to ask questions and all were answered. The patient agreed with the plan and demonstrated an understanding of the instructions.  Patient advised to call back or seek an in-person evaluation if the symptoms or condition worsens.  Duration of encounter: 25 minutes.  Note by: Gaspar Cola, MD Date: 07/24/2021; Time: 3:38 PM

## 2021-07-30 ENCOUNTER — Telehealth: Payer: Self-pay | Admitting: Gastroenterology

## 2021-07-30 NOTE — Telephone Encounter (Signed)
Dr. Vicente Males, can you please advise since Dr. Marius Ditch had not been able to reply back to me. Thank you.

## 2021-07-31 ENCOUNTER — Ambulatory Visit
Admission: RE | Admit: 2021-07-31 | Discharge: 2021-07-31 | Disposition: A | Discharge status: Home / Self Care | Payer: Medicare Other | Source: Ambulatory Visit | Attending: Pain Medicine | Admitting: Pain Medicine

## 2021-07-31 ENCOUNTER — Ambulatory Visit (HOSPITAL_BASED_OUTPATIENT_CLINIC_OR_DEPARTMENT_OTHER): Payer: Medicare Other | Admitting: Pain Medicine

## 2021-07-31 ENCOUNTER — Other Ambulatory Visit: Payer: Self-pay

## 2021-07-31 ENCOUNTER — Encounter: Payer: Self-pay | Admitting: Pain Medicine

## 2021-07-31 VITALS — BP 100/63 | HR 85 | Temp 97.7°F | Resp 16 | Ht 60.0 in | Wt 125.0 lb

## 2021-07-31 DIAGNOSIS — G8929 Other chronic pain: Secondary | ICD-10-CM | POA: Insufficient documentation

## 2021-07-31 DIAGNOSIS — M545 Low back pain, unspecified: Secondary | ICD-10-CM | POA: Insufficient documentation

## 2021-07-31 DIAGNOSIS — M5137 Other intervertebral disc degeneration, lumbosacral region: Secondary | ICD-10-CM | POA: Diagnosis not present

## 2021-07-31 DIAGNOSIS — M47816 Spondylosis without myelopathy or radiculopathy, lumbar region: Secondary | ICD-10-CM

## 2021-07-31 DIAGNOSIS — M47817 Spondylosis without myelopathy or radiculopathy, lumbosacral region: Secondary | ICD-10-CM | POA: Insufficient documentation

## 2021-07-31 DIAGNOSIS — M4696 Unspecified inflammatory spondylopathy, lumbar region: Secondary | ICD-10-CM

## 2021-07-31 DIAGNOSIS — M5136 Other intervertebral disc degeneration, lumbar region: Secondary | ICD-10-CM | POA: Diagnosis not present

## 2021-07-31 MED ORDER — LIDOCAINE HCL 2 % IJ SOLN
20.0000 mL | Freq: Once | INTRAMUSCULAR | Status: AC
  Administered 2021-07-31: 400 mg

## 2021-07-31 MED ORDER — MIDAZOLAM HCL 5 MG/5ML IJ SOLN
0.5000 mg | Freq: Once | INTRAMUSCULAR | Status: AC
  Administered 2021-07-31: 1.5 mg via INTRAVENOUS

## 2021-07-31 MED ORDER — PENTAFLUOROPROP-TETRAFLUOROETH EX AERO
INHALATION_SPRAY | Freq: Once | CUTANEOUS | Status: AC
  Administered 2021-07-31: 30 via TOPICAL
  Filled 2021-07-31: qty 116

## 2021-07-31 MED ORDER — ROPIVACAINE HCL 2 MG/ML IJ SOLN
18.0000 mL | Freq: Once | INTRAMUSCULAR | Status: AC
  Administered 2021-07-31: 20 mL via PERINEURAL

## 2021-07-31 MED ORDER — LACTATED RINGERS IV SOLN
1000.0000 mL | Freq: Once | INTRAVENOUS | Status: AC
  Administered 2021-07-31: 1000 mL via INTRAVENOUS

## 2021-07-31 MED ORDER — TRIAMCINOLONE ACETONIDE 40 MG/ML IJ SUSP
80.0000 mg | Freq: Once | INTRAMUSCULAR | Status: AC
  Administered 2021-07-31: 40 mg

## 2021-07-31 NOTE — Progress Notes (Signed)
PROVIDER NOTE: Information contained herein reflects review and annotations entered in association with encounter. Interpretation of such information and data should be left to medically-trained personnel. Information provided to patient can be located elsewhere in the medical record under "Patient Instructions". Document created using STT-dictation technology, any transcriptional errors that may result from process are unintentional.    Patient: [name]  Service Category: Procedure Provider: Oswaldo Done, MD DOB: 28-Sep-1935 DOS: 07/31/2021 Location: [facility] MRN: [id number] Setting: Ambulatory - outpatient Referring Provider: Doreene Nest, NP Type: Established Patient Specialty: Interventional Pain Management PCP: Doreene Nest, NP  Primary Reason for Visit: Interventional Pain Management Treatment. CC: Back Pain     Procedure:          Type: Lumbar Facet, Medial Branch Block(s)          Primary Purpose: Diagnostic/Therapeutic Region: Posterolateral Lumbosacral Spine Level: L2, L3, L4, L5, & S1 Medial Branch Level(s). Injecting these levels blocks the L3-4, L4-5, and L5-S1 lumbar facet joints. Laterality: Bilateral Anesthesia: Local (1-2% Lidocaine)  Anxiolysis: None  Sedation: None  Guidance: Fluoroscopy          Indications: 1. Lumbar facet syndrome   2. Lumbosacral facet arthropathy (Multilevel) (Bilateral)   3. Spondylosis without myelopathy or radiculopathy, lumbosacral region   4. DDD (degenerative disc disease), lumbosacral   5. Chronic low back pain (1ry area of Pain) (Bilateral) (L>R) w/o sciatica    Pain Score: Pre-procedure: 8 /10 Post-procedure: 7 /10     Position: Prone  Pre-op H&P Assessment:  [name] is a 85 y.o. (year old), female patient, seen today for interventional treatment. She  has a past surgical history that includes Rotator cuff repair (Bilateral); Total shoulder replacement (Left); Cataract extraction  w/PHACO (Left, 04/24/2016); Cataract extraction w/PHACO (Right, 07/14/2017); Esophagogastroduodenoscopy (egd) with propofol (N/A, 10/13/2018); Flexible sigmoidoscopy (N/A, 10/13/2018); Cholecystectomy; Tonsillectomy; Back surgery; Flexible sigmoidoscopy (N/A, 09/09/2019); Flexible sigmoidoscopy (N/A, 08/04/2020); Hip Arthroplasty (Right, 09/27/2020); and Cystoscopy/ureteroscopy/holmium laser/stent placement (Left, 04/27/2021). Ms. Holthus has a current medication list which includes the following prescription(s): atorvastatin, budesonide, citalopram, denosumab, esbriet, hydrocodone-acetaminophen, metoprolol succinate, mupirocin ointment, oxygen-helium, sulfasalazine, vitamin b-12, voltaren, and calcium carbonate. Her primarily concern today is the Back Pain  Initial Vital Signs:  Pulse/HCG Rate: 67ECG Heart Rate: 89 Temp: (!) 97.3 F (36.3 C) Resp: 18 BP: (!) 146/74 SpO2: 98 %  BMI: Estimated body mass index is 24.41 kg/m as calculated from the following:   Height as of this encounter: 5' (1.524 m).   Weight as of this encounter: 125 lb (56.7 kg).  Risk Assessment: Allergies: Reviewed. She has No Known Allergies.  Allergy Precautions: None required Coagulopathies: Reviewed. None identified.  Blood-thinner therapy: None at this time Active Infection(s): Reviewed. None identified. Ms. Powley is afebrile  Site Confirmation: Ms. Wohlgemuth was asked to confirm the procedure and laterality before marking the site Procedure checklist: Completed Consent: Before the procedure and under the influence of no sedative(s), amnesic(s), or anxiolytics, the patient was informed of the treatment options, risks and possible complications. To fulfill our ethical and legal obligations, as recommended by the American Medical Association's Code of Ethics, I have informed the patient of my clinical impression; the nature and purpose of the treatment or procedure; the risks, benefits, and possible complications of the  intervention; the alternatives, including doing nothing; the risk(s) and benefit(s) of the alternative treatment(s) or procedure(s); and the risk(s) and benefit(s) of doing nothing. The patient was provided information about the general risks and possible  complications associated with the procedure. These may include, but are not limited to: failure to achieve desired goals, infection, bleeding, organ or nerve damage, allergic reactions, paralysis, and death. In addition, the patient was informed of those risks and complications associated to Spine-related procedures, such as failure to decrease pain; infection (i.e.: Meningitis, epidural or intraspinal abscess); bleeding (i.e.: epidural hematoma, subarachnoid hemorrhage, or any other type of intraspinal or peri-dural bleeding); organ or nerve damage (i.e.: Any type of peripheral nerve, nerve root, or spinal cord injury) with subsequent damage to sensory, motor, and/or autonomic systems, resulting in permanent pain, numbness, and/or weakness of one or several areas of the body; allergic reactions; (i.e.: anaphylactic reaction); and/or death. Furthermore, the patient was informed of those risks and complications associated with the medications. These include, but are not limited to: allergic reactions (i.e.: anaphylactic or anaphylactoid reaction(s)); adrenal axis suppression; blood sugar elevation that in diabetics may result in ketoacidosis or comma; water retention that in patients with history of congestive heart failure may result in shortness of breath, pulmonary edema, and decompensation with resultant heart failure; weight gain; swelling or edema; medication-induced neural toxicity; particulate matter embolism and blood vessel occlusion with resultant organ, and/or nervous system infarction; and/or aseptic necrosis of one or more joints. Finally, the patient was informed that Medicine is not an exact science; therefore, there is also the possibility of  unforeseen or unpredictable risks and/or possible complications that may result in a catastrophic outcome. The patient indicated having understood very clearly. We have given the patient no guarantees and we have made no promises. Enough time was given to the patient to ask questions, all of which were answered to the patient's satisfaction. Ms. Girdley has indicated that she wanted to continue with the procedure. Attestation: I, the ordering provider, attest that I have discussed with the patient the benefits, risks, side-effects, alternatives, likelihood of achieving goals, and potential problems during recovery for the procedure that I have provided informed consent. Date  Time: 07/31/2021  2:18 PM  Pre-Procedure Preparation:  Monitoring: As per clinic protocol. Respiration, ETCO2, SpO2, BP, heart rate and rhythm monitor placed and checked for adequate function Safety Precautions: Patient was assessed for positional comfort and pressure points before starting the procedure. Time-out: I initiated and conducted the "Time-out" before starting the procedure, as per protocol. The patient was asked to participate by confirming the accuracy of the "Time Out" information. Verification of the correct person, site, and procedure were performed and confirmed by me, the nursing staff, and the patient. "Time-out" conducted as per Joint Commission's Universal Protocol (UP.01.01.01). Time: 1451  Description of Procedure:          Laterality: Bilateral. The procedure was performed in identical fashion on both sides. Levels:  L2, L3, L4, L5, & S1 Medial Branch Level(s) Area Prepped: Posterior Lumbosacral Region DuraPrep (Iodine Povacrylex [0.7% available iodine] and Isopropyl Alcohol, 74% w/w) Safety Precautions: Aspiration looking for blood return was conducted prior to all injections. At no point did we inject any substances, as a needle was being advanced. Before injecting, the patient was told to immediately  notify me if she was experiencing any new onset of "ringing in the ears, or metallic taste in the mouth". No attempts were made at seeking any paresthesias. Safe injection practices and needle disposal techniques used. Medications properly checked for expiration dates. SDV (single dose vial) medications used. After the completion of the procedure, all disposable equipment used was discarded in the proper designated medical waste containers. Local  Anesthesia: Protocol guidelines were followed. The patient was positioned over the fluoroscopy table. The area was prepped in the usual manner. The time-out was completed. The target area was identified using fluoroscopy. A 12-in long, straight, sterile hemostat was used with fluoroscopic guidance to locate the targets for each level blocked. Once located, the skin was marked with an approved surgical skin marker. Once all sites were marked, the skin (epidermis, dermis, and hypodermis), as well as deeper tissues (fat, connective tissue and muscle) were infiltrated with a small amount of a short-acting local anesthetic, loaded on a 10cc syringe with a 25G, 1.5-in  Needle. An appropriate amount of time was allowed for local anesthetics to take effect before proceeding to the next step. Local Anesthetic: Lidocaine 2.0% The unused portion of the local anesthetic was discarded in the proper designated containers. Technical explanation of process:  L2 Medial Branch Nerve Block (MBB): The target area for the L2 medial branch is at the junction of the postero-lateral aspect of the superior articular process and the superior, posterior, and medial edge of the transverse process of L3. Under fluoroscopic guidance, a Quincke needle was inserted until contact was made with os over the superior postero-lateral aspect of the pedicular shadow (target area). After negative aspiration for blood, 0.5 mL of the nerve block solution was injected without difficulty or complication. The  needle was removed intact. L3 Medial Branch Nerve Block (MBB): The target area for the L3 medial branch is at the junction of the postero-lateral aspect of the superior articular process and the superior, posterior, and medial edge of the transverse process of L4. Under fluoroscopic guidance, a Quincke needle was inserted until contact was made with os over the superior postero-lateral aspect of the pedicular shadow (target area). After negative aspiration for blood, 0.5 mL of the nerve block solution was injected without difficulty or complication. The needle was removed intact. L4 Medial Branch Nerve Block (MBB): The target area for the L4 medial branch is at the junction of the postero-lateral aspect of the superior articular process and the superior, posterior, and medial edge of the transverse process of L5. Under fluoroscopic guidance, a Quincke needle was inserted until contact was made with os over the superior postero-lateral aspect of the pedicular shadow (target area). After negative aspiration for blood, 0.5 mL of the nerve block solution was injected without difficulty or complication. The needle was removed intact. L5 Medial Branch Nerve Block (MBB): The target area for the L5 medial branch is at the junction of the postero-lateral aspect of the superior articular process and the superior, posterior, and medial edge of the sacral ala. Under fluoroscopic guidance, a Quincke needle was inserted until contact was made with os over the superior postero-lateral aspect of the pedicular shadow (target area). After negative aspiration for blood, 0.5 mL of the nerve block solution was injected without difficulty or complication. The needle was removed intact. S1 Medial Branch Nerve Block (MBB): The target area for the S1 medial branch is at the posterior and inferior 6 o'clock position of the L5-S1 facet joint. Under fluoroscopic guidance, the Quincke needle inserted for the L5 MBB was redirected until  contact was made with os over the inferior and postero aspect of the sacrum, at the 6 o' clock position under the L5-S1 facet joint (Target area). After negative aspiration for blood, 0.5 mL of the nerve block solution was injected without difficulty or complication. The needle was removed intact.  Nerve block solution: 0.2% PF-Ropivacaine + Triamcinolone (  40 mg/mL) diluted to a final concentration of 4 mg of Triamcinolone/mL of Ropivacaine The unused portion of the solution was discarded in the proper designated containers. Procedural Needles: 22-gauge, 3.5-inch, Quincke needles used for all levels.  Once the entire procedure was completed, the treated area was cleaned, making sure to leave some of the prepping solution back to take advantage of its long term bactericidal properties.      Illustration of the posterior view of the lumbar spine and the posterior neural structures. Laminae of L2 through S1 are labeled. DPRL5, dorsal primary ramus of L5; DPRS1, dorsal primary ramus of S1; DPR3, dorsal primary ramus of L3; FJ, facet (zygapophyseal) joint L3-L4; I, inferior articular process of L4; LB1, lateral branch of dorsal primary ramus of L1; IAB, inferior articular branches from L3 medial branch (supplies L4-L5 facet joint); IBP, intermediate branch plexus; MB3, medial branch of dorsal primary ramus of L3; NR3, third lumbar nerve root; S, superior articular process of L5; SAB, superior articular branches from L4 (supplies L4-5 facet joint also); TP3, transverse process of L3.  Vitals:   07/31/21 1500 07/31/21 1506 07/31/21 1510 07/31/21 1511  BP: (!) 106/57 100/63    Pulse:  85    Resp: 19 16    Temp:  97.7 F (36.5 C)    TempSrc:  Temporal    SpO2: 94% (!) 88% 99% 98%  Weight:      Height:         Start Time: 1451 hrs. End Time: 1500 hrs.  Imaging Guidance (Spinal):          Type of Imaging Technique: Fluoroscopy Guidance (Spinal) Indication(s): Assistance in needle guidance and  placement for procedures requiring needle placement in or near specific anatomical locations not easily accessible without such assistance. Exposure Time: Please see nurses notes. Contrast: None used. Fluoroscopic Guidance: I was personally present during the use of fluoroscopy. "Tunnel Vision Technique" used to obtain the best possible view of the target area. Parallax error corrected before commencing the procedure. "Direction-depth-direction" technique used to introduce the needle under continuous pulsed fluoroscopy. Once target was reached, antero-posterior, oblique, and lateral fluoroscopic projection used confirm needle placement in all planes. Images permanently stored in EMR. Interpretation: No contrast injected. I personally interpreted the imaging intraoperatively. Adequate needle placement confirmed in multiple planes. Permanent images saved into the patient's record.  Antibiotic Prophylaxis:   Anti-infectives (From admission, onward)    None      Indication(s): None identified  Post-operative Assessment:  Post-procedure Vital Signs:  Pulse/HCG Rate: 8568 Temp: 97.7 F (36.5 C) Resp: 16 BP: 100/63 SpO2: 98 %  EBL: None  Complications: No immediate post-treatment complications observed by team, or reported by patient.  Note: The patient tolerated the entire procedure well. A repeat set of vitals were taken after the procedure and the patient was kept under observation following institutional policy, for this type of procedure. Post-procedural neurological assessment was performed, showing return to baseline, prior to discharge. The patient was provided with post-procedure discharge instructions, including a section on how to identify potential problems. Should any problems arise concerning this procedure, the patient was given instructions to immediately contact us, at any time, without hesitation. In any case, we plan to contact the patient by telephone for a follow-up status  report regarding this interventional procedure.  Comments:  No additional relevant information.  Plan of Care  Orders:  Orders Placed This Encounter  Procedures   LUMBAR FACET(MEDIAL BRANCH NERVE BLOCK) MBNB  Scheduling Instructions:     Procedure: Lumbar facet block (AKA.: Lumbosacral medial branch nerve block)     Side: Bilateral     Level: L3-4, L4-5, & L5-S1 Facets (L2, L3, L4, L5, & S1 Medial Branch Nerves)     Sedation: Patient's choice.     Timeframe: Today    Order Specific Question:   Where will this procedure be performed?    Answer:   [facility]   DG [facility] C-ARM 1-60 MIN NO REPORT    Intraoperative interpretation by procedural physician at [facility].    Standing Status:   Standing    Number of Occurrences:   1    Order Specific Question:   Reason for exam:    Answer:   Assistance in needle guidance and placement for procedures requiring needle placement in or near specific anatomical locations not easily accessible without such assistance.   Informed Consent Details: Physician/Practitioner Attestation; Transcribe to consent form and obtain patient signature    Nursing Order: Transcribe to consent form and obtain patient signature. Note: Always confirm laterality of pain with Ms. Janee Morn, before procedure.    Order Specific Question:   Physician/Practitioner attestation of informed consent for procedure/surgical case    Answer:   I, the physician/practitioner, attest that I have discussed with the patient the benefits, risks, side effects, alternatives, likelihood of achieving goals and potential problems during recovery for the procedure that I have provided informed consent.    Order Specific Question:   Procedure    Answer:   Lumbar Facet Block  under fluoroscopic guidance    Order Specific Question:   Physician/Practitioner performing the procedure    Answer:   Jamien Casanova A. Laban Emperor MD    Order Specific Question:   Indication/Reason     Answer:   Low Back Pain, with our without leg pain, due to Facet Joint Arthralgia (Joint Pain) Spondylosis (Arthritis of the Spine), without myelopathy or radiculopathy (Nerve Damage).   Provide equipment / supplies at bedside    "Block Tray" (Disposable  single use) Needle type: SpinalSpinal Amount/quantity: 4 Size: Regular (3.5-inch) Gauge: 22G    Standing Status:   Standing    Number of Occurrences:   1    Order Specific Question:   Specify    Answer:   Block Tray    Chronic Opioid Analgesic:  No chronic opioid analgesics therapy prescribed by our practice. Hydrocodone/APAP 7.5/325, 1 tab p.o. 4 times daily (30 mg/day of hydrocodone) (filled on 02/07/2021) (22.5 MME) (prescribed by Melany Guernsey, ANP.) MME/day: 30 mg/day   Medications ordered for procedure: Meds ordered this encounter  Medications   lidocaine (XYLOCAINE) 2 % (with pres) injection 400 mg   pentafluoroprop-tetrafluoroeth (GEBAUERS) aerosol   lactated ringers infusion 1,000 mL   midazolam (VERSED) 5 MG/5ML injection 0.5-2 mg    Make sure Flumazenil is available in the pyxis when using this medication. If oversedation occurs, administer 0.2 mg IV over 15 sec. If after 45 sec no response, administer 0.2 mg again over 1 min; may repeat at 1 min intervals; not to exceed 4 doses (1 mg)   ropivacaine (PF) 2 mg/mL (0.2%) (NAROPIN) injection 18 mL   triamcinolone acetonide (KENALOG-40) injection 80 mg    Medications administered: We administered lidocaine, pentafluoroprop-tetrafluoroeth, lactated ringers, midazolam, ropivacaine (PF) 2 mg/mL (0.2%), and triamcinolone acetonide.  See the medical record for exact dosing, route, and time of administration.  Follow-up plan:   Return in about 2 weeks (around 08/14/2021)  for Proc-day (T,Th), (VV), (PPE).     Interventional Therapies  Risk  Complexity Considerations:   Estimated body mass index is 24.61 kg/m as calculated from the following:   Height as of this  encounter: 5' (1.524 m).   Weight as of this encounter: 126 lb (57.2 kg). WNL   Planned  Pending:   Diagnostic bilateral lumbar facet block #3    Under consideration:   Diagnostic right L2 and L3 transforaminal ESI #1  Possible left suprascapular nerve RFA #1  Diagnostic right IA glenohumeral and AC joint injection #1    Completed:   Diagnostic bilateral lumbar facet MBB x2 (06/19/2021) (0/20/0/0)  Diagnostic/therapeutic left T12 & L1 TFESI x1 (03/20/2021) (100/100/0/0)  Diagnostic/therapeutic bilateral L3 TFESI x1 (02/13/2021) (50/50/0/0) Diagnostic/therapeutic left L4-5 LESI x1 (02/13/2021) (50/50/0/0)  Diagnostic/therapeutic left suprascapular NB x1 (05/17/2021) (100/50/10/0)   Therapeutic  Palliative (PRN) options:   None established     Recent Visits Date Type Provider Dept  07/24/21 Office Visit Delano Metz, MD [facility]  07/03/21 Office Visit Delano Metz, MD [facility]  06/19/21 Procedure visit Delano Metz, MD [facility]  06/05/21 Office Visit Delano Metz, MD [facility]  05/17/21 Procedure visit Delano Metz, MD [facility]  Showing recent visits within past 90 days and meeting all other requirements Today's Visits Date Type Provider Dept  07/31/21 Procedure visit Delano Metz, MD [facility]  Showing today's visits and meeting all other requirements Future Appointments Date Type Provider Dept  08/16/21 Appointment Delano Metz, MD [facility]  Showing future appointments within next 90 days and meeting all other requirements Disposition: Discharge home  Discharge (Date  Time): 07/31/2021; 1511 hrs.   Primary Care Physician: Doreene Nest, NP Location: [facility] Note by: Oswaldo Done, MD Date: 07/31/2021; Time: 4:05 PM  Disclaimer:  Medicine is not an Visual merchandiser. The only guarantee in medicine is  that nothing is guaranteed. It is important to note that the decision to proceed with this intervention was based on the information collected from the patient. The Data and conclusions were drawn from the patient's questionnaire, the interview, and the physical examination. Because the information was provided in large part by the patient, it cannot be guaranteed that it has not been purposely or unconsciously manipulated. Every effort has been made to obtain as much relevant data as possible for this evaluation. It is important to note that the conclusions that lead to this procedure are derived in large part from the available data. Always take into account that the treatment will also be dependent on availability of resources and existing treatment guidelines, considered by other Pain Management Practitioners as being common knowledge and practice, at the time of the intervention. For Medico-Legal purposes, it is also important to point out that variation in procedural techniques and pharmacological choices are the acceptable norm. The indications, contraindications, technique, and results of the above procedure should only be interpreted and judged by a Board-Certified Interventional Pain Specialist with extensive familiarity and expertise in the same exact procedure and technique.

## 2021-07-31 NOTE — Telephone Encounter (Signed)
Called patient to check up on her and I had to leave her a voicemail asking if she was feeling better. Hopefully she will call soon.

## 2021-07-31 NOTE — Patient Instructions (Signed)

## 2021-07-31 NOTE — Progress Notes (Signed)
Safety precautions to be maintained throughout the outpatient stay will include: orient to surroundings, keep bed in low position, maintain call bell within reach at all times, provide assistance with transfer out of bed and ambulation.  

## 2021-08-01 ENCOUNTER — Telehealth: Payer: Self-pay | Admitting: *Deleted

## 2021-08-01 NOTE — Telephone Encounter (Signed)
Attempted to call for post procedure follow-up. Message left. 

## 2021-08-02 NOTE — Telephone Encounter (Signed)
Called patient to check up on her and she stated that she was feeling better. I told her to call us back if she was to feel bad again. Patient agreed and had no further questions.

## 2021-08-06 ENCOUNTER — Telehealth: Payer: Self-pay

## 2021-08-06 NOTE — Chronic Care Management (AMB) (Signed)
Chronic Care Management Pharmacy Assistant   Name: [name]  MRN: [id number] DOB: 12/31/1935  [name] is an 85 y.o. year old female who presents for his initial CCM visit with the clinical pharmacist.  Reason for Encounter: Initial Questions   Conditions to be addressed/monitored: HLD and Pulmonary Disease   Recent office visits:  02/23/21-PCP-Katherine Clark,NP-Patient presented for follow up anxiety and tremors.Labs ordered(abnormal) start Atorvastatin 24m take 1 daily  02/16/21-Family Medicine-Telemedicine encounter for AWV. Educated on vaccines,screenings,no medication changes  Recent consult visits:  07/31/21-[facility] -Francisco Naveira,MD-Patient presented for lumbar injection.Triamcinolone Acetonide 414m 07/24/21-Francisco Naveira,MD-Patient presented for follow up after a fall.Xrays reviewed and procedure scheduled.No medication changes 07/03/21-Francisco Naveira,MD-Telemedicine-Patient presented for follow up procedure. Future xrays ordered.No medication changes 07/02/21-Dermatology-David Kowalski,MD-Patient presented for follow up basil cell carcinoma excision.No medication changes  06/19/21-Dermatology-David Kowalski,MD-Patient presented for post op.Discussed pathology,suture removal,no medication changes 06/14/21-Dermatology-Patient presented for procedure to remove skin lesion right side of neck. Started Bactroban 2% apply topically daily  06/14/21-Cardiology-Dwayne Callwood,MD-Patient presented for follow up cardiomyopathy-metoprolol to half patient fatigue sluggishness slightly bradycardic,have the patient follow-up with pulmonary for inhalers oxygen respiratory therapy 06/05/21-[facility]-Francisco Naveira,MD-Patient presented for Lumbar Facet injections.No medication changes. 05/22/21-Pulmonology-Herbon Fleming,MD-Patient presented for pulmonary function test.Follow up pulmonary fibrosis.Trial of neurontin 100 mg q 12 hrs and  titrate up as indicated and tolerated 05/17/21-[facility]-Francisco Naveira,MD-Patient presented for left shoulder injection-Methylprednisolone Acetate 8010m/8/22-Urology-Brian Sninsky,MD-Patient presented for removal of ureteral stent.Bactrim given for prophylaxis 05/03/21-Dermatology-David Kowalski,MD-Patient presented for actinic keratosis,no medication changes 04/26/21-Urology-Brian Sninsky,MD-Patient presented for initial visit for kidney stones.Left ureteroscopy, laser lithotripsy, stent placement this week 04/24/21-[facility]-Francisco Naveira,MD-Patient presented for follow up back pain, procedures, start Calcium carbonate 1500m19mtablet 2 times daily ,magnesium Oxide 500mg59me 1 two times daily,Vitamin D3 5000units  take 1 daily  04/17/21-Pulmonology-Herbon Fleming,MD-no data found 02/27[facility]21- [facility] up [facility] pain,no medication changes 02/22/21-[facility]-Anna Kiran,MD-Patient presented for follow up ulcerative colitis-Recommend checking CBC every 4 to 6-mon52-monthval with Clark,Pleas Kochulfasalazine can occasionally cause of agranulocytosis 02/08/21-[facility]- Francisco Naveira,MD-Patient presented for follow up back pain procedures and schedule another injection. No medication changes   Hospital visits:  04/27/21-[facility]- Brian Sninski,MD-Patient presented for cystoscopy and stent placement  04/21/21-[facility]-Mary Funke,MD-Patient presented for flank pain.Labs ordered,ECG,CT-scan,short dose of toradol, zofran, referral to urology-no admission   Medications: Outpatient Encounter Medications as of 08/06/2021  Medication Sig   atorvastatin (LIPITOR) 20 MG tablet Take 1 tablet (20 mg total) by mouth daily. For cholesterol.   budesonide (PULMICORT) 180 MCG/ACT inhaler Inhale 2 puffs into the lungs 2 (two) times daily.   calcium carbonate (OSCAL) 1500 (600 Ca) MG TABS tablet Take 1 tablet (1,500  mg total) by mouth 2 (two) times daily with a meal.   citalopram (CELEXA) 40 MG tablet TAKE 1 TABLET (40 MG TOTAL) BY MOUTH DAILY. FOR ANXIETY.   denosumab (PROLIA) 60 MG/ML SOSY injection Inject 60 mg into the skin every 6 (six) months.   ESBRIET 267 MG TABS Take 267 mg by mouth 3 (three) times daily with meals.   HYDROcodone-acetaminophen (NORCO/VICODIN) 5-325 MG tablet Take 1 tablet by mouth 2 (two) times daily as needed.   metoprolol succinate (TOPROL-XL) 25 MG 24 hr tablet Take 12.5 mg by mouth daily.   mupirocin ointment (BACTROBAN) 2 % Apply 1 application topically daily. With dressing changes   OXYGEN Inhale 2 L into the lungs at bedtime.  sulfaSALAzine (AZULFIDINE) 500 MG tablet Take 1 tablet (500 mg total) by mouth 2 (two) times daily.   vitamin B-12 (CYANOCOBALAMIN) 1000 MCG tablet Take 1,000 mcg by mouth every other day.   VOLTAREN 1 % GEL Apply 2 g topically in the morning, at noon, and at bedtime. Shoulders and back   No facility-administered encounter medications on file as of 08/06/2021.    Lab Results  Component Value Date/Time   HGBA1C 6.1 02/09/2014 05:00 AM     BP Readings from Last 3 Encounters:  07/31/21 100/63  07/24/21 (!) 128/91  06/19/21 (!) 165/75    Patient contacted to review initial questions prior to visit with Charlene Brooke.  Have you seen any other providers since your last visit with PCP? Yes  Any changes in your medications or health? No  Any side effects from any medications? No  Do you have an symptoms or problems not managed by your medications? No  Any concerns about your health right now? Yes The patient reports she has been having some difficulty with her throat and has appointment next week to have this checked.  Has your provider asked that you check blood pressure, blood sugar, or follow special diet at home? No  Do you get any type of exercise on a regular basis? No  Can you think of a goal you would like to reach for your  health? No  Do you have any problems getting your medications? No   Is there anything that you would like to discuss during the appointment? No   Spoke with patient and reminded them to have all medications, supplements and any blood glucose and blood pressure readings available for review with pharmacist, at their telephone visit on 08/09/21 at [facility]:  Medication:  Last Fill: Day Supply Atorvastatin 34m 07/08/21 90   Care Gaps: Annual wellness visit in last year? Yes Most Recent BP reading:  128/91 95-P  07/24/21   LMarjo Bicker CPP notified  [place] Sensor CM[place] CityAssistant [phone] Total time spent for month CPA: 50 min.

## 2021-08-09 ENCOUNTER — Other Ambulatory Visit: Payer: Self-pay

## 2021-08-09 ENCOUNTER — Ambulatory Visit: Payer: Medicare Other | Admitting: Pharmacist

## 2021-08-09 DIAGNOSIS — F419 Anxiety disorder, unspecified: Secondary | ICD-10-CM

## 2021-08-09 DIAGNOSIS — G894 Chronic pain syndrome: Secondary | ICD-10-CM

## 2021-08-09 DIAGNOSIS — Q782 Osteopetrosis: Secondary | ICD-10-CM

## 2021-08-09 DIAGNOSIS — Z8619 Personal history of other infectious and parasitic diseases: Secondary | ICD-10-CM

## 2021-08-09 DIAGNOSIS — J84112 Idiopathic pulmonary fibrosis: Secondary | ICD-10-CM

## 2021-08-09 DIAGNOSIS — E7800 Pure hypercholesterolemia, unspecified: Secondary | ICD-10-CM

## 2021-08-09 DIAGNOSIS — I10 Essential (primary) hypertension: Secondary | ICD-10-CM

## 2021-08-09 NOTE — Progress Notes (Signed)
Chronic Care Management Pharmacy Note  08/12/2021 Name:  [name] MRN:  [id number] DOB:  May 30, 1936  Summary: -Pt reports she is taking Esbriet 1 tablet TID (less than prescribed) due to tolerability issues; she also reports her insurance changes in January and Esbriet will not be affordable anymore; her [place] for this med is expired and no funds available for re-enrollment -Pt reports persistent diarrhea after being treated for recurrent C.Diff early November; she had told GI on 12/1 that she was "feeling better" but apparently meant that she did not feel good, just better  Recommendations/Changes made from today's visit: -Pursue pt assistance for Esbriet - mailed pt the forms and she will bring them to Pulmonary to complete enrollment -Advised pt to contact GI regarding persistent diarrhea  Plan: -[place] will call patient in 2 weeks for PAP help -Pharmacist follow up televisit scheduled for 3 months    Subjective: [name] is an 85 y.o. year old female who is a primary patient of [name], NP.  The CCM team was consulted for assistance with disease management and care coordination needs.    Engaged with patient by telephone for initial visit in response to provider referral for pharmacy case management and/or care coordination services.   Consent to Services:  The patient was given the following information about Chronic Care Management services today, agreed to services, and gave verbal consent: 1. CCM service includes personalized support from designated clinical staff supervised by the primary care provider, including individualized plan of care and coordination with other care providers 2. 24/7 contact phone numbers for assistance for urgent and routine care needs. 3. Service will only be billed when office clinical staff spend 20 minutes or more in a month to coordinate care. 4. Only one practitioner may furnish and bill the  service in a calendar month. 5.The patient may stop CCM services at any time (effective at the end of the month) by phone call to the office staff. 6. The patient will be responsible for cost sharing (co-pay) of up to 20% of the service fee (after annual deductible is met). Patient agreed to services and consent obtained.  Patient Care Team: [name], NP as PCP - General (Internal Medicine) Leandrew Koyanagi, MD as Referring Physician (Ophthalmology) Erby Pian, MD as Referring Physician (Specialist) Yolonda Kida, MD as Consulting Physician (Cardiology) Rubie Maid, MD as Referring Physician (Obstetrics and Gynecology) Catalina Antigua as Referring Physician (Physician Assistant) Thornton Park, MD as Referring Physician (Orthopedic Surgery) Charlton Haws, [facility] as Pharmacist (Pharmacist)  Recent office visits: 02/23/21-PCP-Katherine Clark,NP-Patient presented for follow up anxiety and tremors.Labs ordered(abnormal) start Atorvastatin 66m take 1 daily   02/16/21-Family Medicine-Telemedicine encounter for AWV. Educated on vaccines,screenings,no medication changes  Recent consult visits: 07/31/21-[facility] -Francisco Naveira,MD-Patient presented for lumbar injection.Triamcinolone Acetonide 45m 07/24/21-Francisco Naveira,MD-Patient presented for follow up after a fall.Xrays reviewed and procedure scheduled.No medication changes 07/04/21 Dr AnVicente MalesGI) TE - Cdiff PCR positive, sent fidaxomicin x 10 days. 07/03/21-Francisco Naveira,MD-Telemedicine-Patient presented for follow up procedure. Future xrays ordered.No medication changes 07/02/21-Dermatology-David Kowalski,MD-Patient presented for follow up basil cell carcinoma excision.No medication changes  06/19/21-Dermatology-David Kowalski,MD-Patient presented for post op.Discussed pathology,suture removal,no medication changes 06/14/21-Dermatology-Patient presented for procedure to remove skin  lesion right side of neck. Started Bactroban 2% apply topically daily  06/14/21-Cardiology-Dwayne Callwood,MD-Patient presented for follow up cardiomyopathy-reduce metoprolol to half patient fatigue sluggishness slightly bradycardic,have the patient follow-up with pulmonary for inhalers oxygen respiratory therapy  06/05/21-[facility]-Francisco Naveira,MD-Patient presented for Lumbar Facet injections.No medication changes. 05/22/21-Pulmonology-Herbon Fleming,MD-Patient presented for pulmonary function test.Follow up pulmonary fibrosis.Trial of Pulmicort BID, Trial of gabapentin 100 mg q 12 hrs and titrate up as indicated and tolerated 05/17/21-[facility]-Francisco Naveira,MD-Patient presented for left shoulder injection-Methylprednisolone Acetate 27m 05/10/21-Urology-Brian Sninsky,MD-Patient presented for removal of ureteral stent.Bactrim given for prophylaxis 05/03/21-Dermatology-David Kowalski,MD-Patient presented for actinic keratosis,no medication changes 04/26/21-Urology-Brian Sninsky,MD-Patient presented for initial visit for kidney stones.Left ureteroscopy, laser lithotripsy, stent placement this week 04/24/21-[facility]-Francisco Naveira,MD-Patient presented for follow up back pain, procedures, start Calcium carbonate 15018m1 tablet 2 times daily ,magnesium Oxide 5003make 1 two times daily,Vitamin D3 5000units  take 1 daily  04/17/21-Pulmonology-Herbon Fleming,MD-no data found 02/27/21[facility] pain,no medication changes 02/22/21-[facility]-Anna Kiran,MD-Patient presented for follow up ulcerative colitis-Recommend checking CBC every 4 to 6-m72-montherval with ClarPleas Koch sulfasalazine can occasionally cause of agranulocytosis 02/08/21-[facility]- Francisco Naveira,MD-Patient presented for follow up back pain procedures and schedule another injection. No medication changes     Hospital visits: 04/27/21-[facility]- Brian Sninski,MD-Patient presented for cystoscopy and stent placement  04/21/21-[facility]-Mary Funke,MD-Patient presented for flank pain.Labs ordered,ECG,CT-scan,short dose of toradol, zofran, referral to urology-no admission    Objective:  Lab Results  Component Value Date   CREATININE 0.73 04/21/2021   BUN 24 (H) 04/21/2021   GFR 75.35 10/24/2020   GFRNONAA >60 04/21/2021   GFRAA 76 09/21/2020   NA 139 04/21/2021   K 4.4 04/21/2021   CALCIUM 9.2 04/21/2021   CO2 28 04/21/2021   GLUCOSE 126 (H) 04/21/2021    Lab Results  Component Value Date/Time   HGBA1C 6.1 02/09/2014 05:00 AM   GFR 75.35 10/24/2020 11:41 AM   GFR 74.02 03/03/2020 12:38 PM    Last diabetic Eye exam: No results found for: HMDIABEYEEXA  Last diabetic Foot exam: No results found for: HMDIABFOOTEX   Lab Results  Component Value Date   CHOL 199 05/03/2021   HDL 73.40 05/03/2021   LDLCALC 106 (H) 05/03/2021   TRIG 100.0 05/03/2021   CHOLHDL 3 05/03/2021    Hepatic Function Latest Ref Rng & Units 04/21/2021 09/26/2020 08/01/2020  Total Protein 6.5 - 8.1 g/dL 7.0 7.0 6.7  Albumin 3.5 - 5.0 g/dL 3.8 3.6 3.9  AST 15 - 41 U/L 36 31 34  ALT 0 - 44 U/L 25 18 17   Alk Phosphatase 38 - 126 U/L 69 58 80  Total Bilirubin 0.3 - 1.2 mg/dL 0.6 0.8 0.4    Lab Results  Component Value Date/Time   TSH 1.53 03/15/2020 11:07 AM   TSH 0.922 09/16/2018 12:32 PM    CBC Latest Ref Rng & Units 04/21/2021 02/23/2021 10/24/2020  WBC 4.0 - 10.5 K/uL 15.8(H) 7.5 7.1  Hemoglobin 12.0 - 15.0 g/dL 13.7 13.9 12.8  Hematocrit 36.0 - 46.0 % 41.3 41.6 39.3  Platelets 150 - 400 K/uL 263 300.0 307.0    Lab Results  Component Value Date/Time   VD25OH 37.79 10/24/2020 11:41 AM   VD25OH 33.94 09/13/2019 11:00 AM    Clinical ASCVD: No  The ASCVD Risk score (Arnett DK, et al., 2019) failed to calculate for the following reasons:   The 2019 ASCVD risk score is only valid for ages 40 t4079  97 Depression screen PHQ 2/9 07/31/2021 07/24/2021 06/19/2021  Decreased Interest 0 0 0  Down, Depressed, Hopeless 0 0 0  PHQ - 2 Score 0 0  0  Altered sleeping - - -  Tired, decreased energy - - -  Change in appetite - - -  Feeling bad or failure about yourself  - - -  Trouble concentrating - - -  Moving slowly or fidgety/restless - - -  Suicidal thoughts - - -  PHQ-9 Score - - -  Difficult doing work/chores - - -  Some recent data might be hidden     Social History   Tobacco Use  Smoking Status Never  Smokeless Tobacco Never   BP Readings from Last 3 Encounters:  07/31/21 100/63  07/24/21 (!) 128/91  06/19/21 (!) 165/75   Pulse Readings from Last 3 Encounters:  07/31/21 85  07/24/21 95  06/19/21 62   Wt Readings from Last 3 Encounters:  07/31/21 125 lb (56.7 kg)  07/24/21 125 lb (56.7 kg)  06/19/21 126 lb (57.2 kg)   BMI Readings from Last 3 Encounters:  07/31/21 24.41 kg/m  07/24/21 24.41 kg/m  06/19/21 24.61 kg/m    Assessment/Interventions: Review of patient past medical history, allergies, medications, health status, including review of consultants reports, laboratory and other test data, was performed as part of comprehensive evaluation and provision of chronic care management services.   SDOH:  (Social Determinants of Health) assessments and interventions performed: Yes  SDOH Screenings   Alcohol Screen: Low Risk    Last Alcohol Screening Score (AUDIT): 0  Depression (PHQ2-9): Low Risk    PHQ-2 Score: 0  Financial Resource Strain: Low Risk    Difficulty of Paying Living Expenses: Not hard at all  Food Insecurity: No Food Insecurity   Worried About Charity fundraiser in the Last Year: Never true   Ran Out of Food in the Last Year: Never true  Housing: Low Risk    Last Housing Risk Score: 0  Physical Activity: Inactive   Days of Exercise per Week: 0 days   Minutes of Exercise per Session: 0 min  Social Connections: Not on file  Stress: Stress  Concern Present   Feeling of Stress : To some extent  Tobacco Use: Low Risk    Smoking Tobacco Use: Never   Smokeless Tobacco Use: Never   Passive Exposure: Not on file  Transportation Needs: No Transportation Needs   Lack of Transportation (Medical): No   Lack of Transportation (Non-Medical): No    CCM Care Plan  No Known Allergies  Medications Reviewed Today     Reviewed by Charlton Haws, [facility] (Pharmacist) on 08/09/21 at 1001  Med List Status: <None>   Medication Order Taking? Sig Documenting Provider Last Dose Status Informant  atorvastatin (LIPITOR) 20 MG tablet [id number] Yes Take 1 tablet (20 mg total) by mouth daily. For cholesterol. [name], NP Taking Active Self           Med Note Donneta Romberg, DENA L   Thu May 17, 2021 10:31 AM)    budesonide (PULMICORT) 180 MCG/ACT inhaler [id number] Yes Inhale 2 puffs into the lungs 2 (two) times daily. [provider] Taking Active Self  calcium carbonate (OSCAL) 1500 (600 Ca) MG TABS tablet [id number]  Take 1 tablet (1,500 mg total) by mouth 2 (two) times daily with a meal. Milinda Pointer, MD  Expired 07/23/21 2359            Med Note (WHEATLEY, DENA L   Tue Jul 03, 2021  8:56 AM)    Cholecalciferol (VITAMIN D) 125 MCG (5000 UT) CAPS [id number] Yes Take 5,000 Units by mouth  daily. [provider] Taking Active   citalopram (CELEXA) 40 MG tablet [id number] Yes TAKE 1 TABLET (40 MG TOTAL) BY MOUTH DAILY. FOR ANXIETY. [name], NP Taking Active   denosumab (PROLIA) 60 MG/ML SOSY injection [id number] Yes Inject 60 mg into the skin every 6 (six) months. [provider] Taking Active Self  ESBRIET 267 MG TABS [id number] Yes Take 267 mg by mouth 3 (three) times daily with meals. [provider] Taking Active Self           Med Note Despina Hidden   Thu Apr 26, 2021  8:51 AM)    HYDROcodone-acetaminophen (NORCO/VICODIN) 5-325 MG tablet [id number] Yes Take 1 tablet by mouth 2 (two)  times daily as needed. [provider] Taking Active   Magnesium Oxide 500 MG CAPS [id number] Yes Take 500 mg by mouth 2 (two) times daily. [provider] Taking Active   metoprolol succinate (TOPROL-XL) 25 MG 24 hr tablet [id number] Yes Take 25 mg by mouth every other day. [provider] Taking Active   mupirocin ointment (BACTROBAN) 2 % [id number] Yes Apply 1 application topically daily. With dressing changes Ralene Bathe, MD Taking Active   OXYGEN [id number] Yes Inhale 2 L into the lungs at bedtime. [provider] Taking Active Self  sulfaSALAzine (AZULFIDINE) 500 MG tablet [id number] Yes Take 1 tablet (500 mg total) by mouth 2 (two) times daily. [name], MD Taking Active   vitamin B-12 (CYANOCOBALAMIN) 1000 MCG tablet [id number] Yes Take 1,000 mcg by mouth every other day. [provider] Taking Active Self  VOLTAREN 1 % GEL [id number] Yes Apply 2 g topically in the morning, at noon, and at bedtime. Shoulders and back [provider] Taking Active Self  Med List Note Landis Martins, RN 01/16/21 1153): UDS 01/16/21            Patient Active Problem List   Diagnosis Date Noted   Fall (06/28/2021) 07/03/2021   Chronic shoulder pain (Left) 05/17/2021   History of total shoulder replacement (Left) 05/17/2021   Pain in shoulder region after shoulder replacement (Left) 05/17/2021   History of arthroplasty of shoulder (Left) 05/17/2021   Osteoporosis with current pathological fracture, sequela 04/24/2021   Osteoporosis with pathological fracture of lumbar vertebra ([place]) 04/24/2021   Trigger point of shoulder region (Left) 04/24/2021   Tendinitis of triceps (Left) 04/24/2021   Chronic upper back pain 04/24/2021   Chronic thoracic back pain (Left) 04/24/2021   Trigger point with back pain 04/24/2021   Lower extremity weakness (Bilateral) 04/24/2021   Myofascial pain syndrome of lumbar spine 04/24/2021   Myofascial pain syndrome  of thoracic spine 04/24/2021   Cervicalgia 04/24/2021   Chronic radicular pain of lower back 02/13/2021   Lumbar spondylosis 02/13/2021   Lumbosacral facet arthropathy (Multilevel) (Bilateral) 01/16/2021   Lumbar facet syndrome 01/16/2021   Spondylosis without myelopathy or radiculopathy, lumbosacral region 01/16/2021   DDD (degenerative disc disease), lumbosacral 01/16/2021   DDD (degenerative disc disease), thoracolumbar 01/16/2021   Chronic use of opiate for therapeutic purpose 01/16/2021   Abnormal MRI, lumbar spine (12/06/2020) 01/15/2021   T12 compression fracture, sequela 01/15/2021   Compression fracture of L1 lumbar vertebra, sequela 01/15/2021   Compression fracture of L3 lumbar vertebra, sequela 01/15/2021   Chronic pain syndrome 01/03/2021   Pharmacologic therapy 01/03/2021   Disorder of skeletal system 01/03/2021   Problems influencing health status 01/03/2021   Chronic groin pain (2ry area of Pain) (Right) 01/03/2021   Chronic  low back pain (1ry area of Pain) (Bilateral) (L>R) w/o sciatica 01/03/2021   Chronic hip pain after total replacement (Right) 01/03/2021   Chronic shoulder pain (3ry area of Pain) (Bilateral) (L>R) 01/03/2021   Cough 12/20/2020   C. 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    Immunization History  Administered Date(s) Administered   Fluad Quad(high Dose 65+) 04/30/2019   Influenza, High Dose  Seasonal PF 06/21/2015, 05/18/2016, 05/31/2017, 06/18/2018   Influenza-Unspecified 05/11/2020   PFIZER Comirnaty(Gray Top)Covid-19 Tri-Sucrose Vaccine 10/02/2020   PFIZER(Purple Top)SARS-COV-2 Vaccination 10/27/2019, 11/17/2019, 02/19/2021   Pneumococcal Conjugate-13 04/18/2014   Pneumococcal Polysaccharide-23 07/24/2004, 12/28/2013   Td 09/02/2001   Tdap 05/25/2011   Zoster Recombinat (Shingrix) 04/14/2020   Zoster, Live 04/19/2010    Conditions to be addressed/monitored:  Hypertension, Hyperlipidemia, Anxiety, and Osteoporosis, Chronic pain, Ulcerative colitis, Idiopathic pulmonary fibrosis  Care Plan : CCM Pharmacy Care Plan  Updates made by Charlton Haws, [place] since 08/12/2021 12:00 AM     Problem: Hyperlipidemia, Anxiety, and Osteoporosis, Chronic pain, Ulcerative colitis, Idiopathic pulmonary fibrosis   Priority: High     Long-Range Goal: Disease mgmt   Start Date: 08/12/2021  Expected End Date: 08/12/2022  This Visit's Progress: On track  Priority: High  Note:   Current Barriers:  Unable to independently monitor therapeutic efficacy  Pharmacist Clinical Goal(s):  Patient will achieve adherence to monitoring guidelines and medication adherence to achieve therapeutic efficacy through collaboration with PharmD and provider.   Interventions: 1:1 collaboration with [name], NP regarding development and update of comprehensive plan of care as evidenced by provider attestation and co-signature Inter-disciplinary care team collaboration (see longitudinal plan of care) Comprehensive medication review performed; medication list updated in electronic medical record  Hypertension / Cardiomyopathy (BP goal <140/90) -Controlled - pt report limited palpitations; she is not checking BP/HR often but SBP is usually 100-120, she denies lightheadedness -Hx palpitations/tachycardia -Current treatment: Metoprolol succinate 25 mg - every other day -Denies  hypotensive/hypertensive symptoms -Educated on BP goals and benefits of medications for prevention of heart attack, stroke and kidney damage; -Counseled to monitor BP at home periodically -Recommended to continue current medication  Hyperlipidemia: (LDL goal < 100) -Controlled -baseline LDL 141, improved to 106 after statin initiation; pt endorses compliance with statin but needs a refill; advised pt her pharmacy has refills on file -Current treatment: Atorvastatin 20 mg daily -Educated on Cholesterol goals; Benefits of statin for ASCVD risk reduction; -Recommended to continue current medication  Anxiety (Goal: manage symptoms) -Controlled - per pt report -Qtc 445 (04/21/21) -Current treatment: Citalopram 40 mg daily -PHQ9: 0 -GAD7: not on file -Connected with PCP for mental health support -Educated on Benefits of medication for symptom control -Recommended to continue current medication  Osteoporosis (Goal prevent fractures) -Controlled - pt reports she gets Prolia twice a year -Last DEXA Scan: 10/01/18   T-Score femoral neck: -2.6  T-Score forearm radius: -1.4 -Patient is a candidate for pharmacologic treatment due to history of vertebral fracture and T-Score < -2.5 in  femoral neck -Current treatment  Prolia (started 09/2018?) Calcium carbonate 600 mg BID Vitamin D 5000 IU daily -Recommend weight-bearing and muscle strengthening exercises for building and maintaining bone density. - seated exercises, pedals -Recommended to continue current medication  Idiopathic pulmonary fibrosis (Goal: manage symptoms) -Not ideally controlled - pt reports severe coughing w/ mucus; Delsym/OTC cough did not help; cough drops have helped some -Follows with pulmonary, Dr Raul Del. 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 She should qualify for Esbriet [place]) pt assistance. Mailed forms to patient and she will bring to prescriber to complete -Recommend to continue current medication  Chronic pain (Goal: manage pain) -Controlled -Follows with pain mgmt, Dr Dossie Arbour; hx lumbar DDD, compression fracture of L1, L3, L4, T12; s/p hip replacement; shoulder pain; cervicalgia; pt gets steroid injections periodically -Hydrocodone is prescribed by NP Allyson Sabal ([facility]) separate from Dr Dossie Arbour; pt is interested in consolidating her pain mgmt providers so she does not have to go to 2 providers -Current treatment  Hydrocodone-APAP 5-325 mg BID prn - #60 per month Voltaren gel (shoulder) -Advised pt to discuss with Dr Dossie Arbour about consolidating providers/asking him to take over prescribing of hydrocodone -Recommended to continue current medication  Ulcerative colitis (Goal: manage symptoms, prevent flares) -Not ideally controlled - pt reports persistent diarrhea after Cdiff treatment 11/2. She reports she feels better overall but still have diarrhea multiple times daily; she has not reported this to GI yet - per chart GI has talked to her 12/1 and she had told them she was doing better - per pt she did not mean that she was doing "good", just better -Follows with GI, Dr Vicente Males. Hx of recurrent C.Diff -Current treatment  Sulfasalazine 500 mg BID -Advised pt to contact GI regarding persistent diarrhea -Recommend to continue current medication  Health Maintenance -Vaccine gaps: Flu, covid booster, Shingrix, TDAP -Advised flu vaccine at next office visit or at pharamacy -Current therapy:  Vitamin B12 500  mcg  QOD Magnesium oxide 500 mg BID -Patient is satisfied with current therapy and denies issues -Recommended to continue current medication  Patient Goals/Self-Care Activities Patient will:  - take medications as prescribed as evidenced by patient report and record review focus on medication adherence by pill box collaborate with provider on medication access solutions (Bring Esbriet forms to Pulmonary) -Contact GI regarding persistent diarrhea -Discuss consolidating pain management with Dr Dossie Arbour      Medication Assistance: None required.  Patient affirms current coverage meets needs.  Compliance/Adherence/Medication fill history: Care Gaps: None  Star-Rating Drugs: Atorvastatin 20 mg - LF 07/08/21 x 90 ds; [place] 100%  Patient's preferred pharmacy is:  [id number] [id number]: [phone]: [phone] Uses pill box? Yes Pt endorses 100% compliance  We discussed: Current pharmacy is preferred with insurance plan and patient is satisfied with pharmacy services Patient decided to: Continue current medication management strategy  Care Plan and Follow Up Patient Decision:  Patient agrees to Care Plan and Follow-up.  Plan: Telephone follow up appointment with care management team member scheduled for:  3 months  Charlene Brooke, PharmD, BCACP Clinical Pharmacist [facility] [phone]

## 2021-08-12 NOTE — Patient Instructions (Signed)
Visit Information  Phone number for Pharmacist: [phone]  Thank you for meeting with me to discuss your medications! I look forward to working with you to achieve your health care goals. Below is a summary of what we talked about during the visit:   Goals Addressed             This Visit's Progress    Manage My Medicine       Timeframe:  Long-Range Goal Priority:  High Start Date:        08/12/21                     Expected End Date:  08/12/22                     Follow Up Date March 2023   - call for medicine refill 2 or 3 days before it runs out - call if I am sick and can't take my medicine - keep a list of all the medicines I take; vitamins and herbals too - use a pillbox to sort medicine  -Contact GI regarding persistent diarrhea -Discuss consolidating pain management with Dr Dossie Arbour   Why is this important?   These steps will help you keep on track with your medicines.   Notes:         Care Plan : [place]  Updates made by Charlton Haws, RPH since 08/12/2021 12:00 AM     Problem: Hyperlipidemia, Anxiety, and Osteoporosis, Chronic pain, Ulcerative colitis, Idiopathic pulmonary fibrosis   Priority: High     Long-Range Goal: Disease mgmt   Start Date: 08/12/2021  Expected End Date: 08/12/2022  This Visit's Progress: On track  Priority: High  Note:   Current Barriers:  Unable to independently monitor therapeutic efficacy  Pharmacist Clinical Goal(s):  Patient will achieve adherence to monitoring guidelines and medication adherence to achieve therapeutic efficacy through collaboration with PharmD and provider.   Interventions: 1:1 collaboration with [name], NP regarding development and update of comprehensive plan of care as evidenced by provider attestation and co-signature Inter-disciplinary care team collaboration (see longitudinal plan of care) Comprehensive medication review performed; medication list updated in  electronic medical record  Hypertension / Cardiomyopathy (BP goal <140/90) -Controlled - pt report limited palpitations; she is not checking BP/HR often but SBP is usually 100-120, she denies lightheadedness -Hx palpitations/tachycardia -Current treatment: Metoprolol succinate 25 mg - every other day -Denies hypotensive/hypertensive symptoms -Educated on BP goals and benefits of medications for prevention of heart attack, stroke and kidney damage; -Counseled to monitor BP at home periodically -Recommended to continue current medication  Hyperlipidemia: (LDL goal < 100) -Controlled -baseline LDL 141, improved to 106 after statin initiation; pt endorses compliance with statin but needs a refill; advised pt her pharmacy has refills on file -Current treatment: Atorvastatin 20 mg daily -Educated on Cholesterol goals; Benefits of statin for ASCVD risk reduction; -Recommended to continue current medication  Anxiety (Goal: manage symptoms) -Controlled - per pt report -Qtc 445 (04/21/21) -Current treatment: Citalopram 40 mg daily -PHQ9: 0 -GAD7: not on file -Connected with PCP for mental health support -Educated on Benefits of medication for symptom control -Recommended to continue current medication  Osteoporosis (Goal prevent fractures) -Controlled - pt reports she gets Prolia twice a year -Last DEXA Scan: 10/01/18   T-Score femoral neck: -2.6  T-Score forearm radius: -1.4 -Patient is a candidate for pharmacologic treatment due to history of vertebral fracture and T-Score < -  2.5 in femoral neck -Current treatment  Prolia (started 09/2018?) Calcium carbonate 600 mg BID Vitamin D 5000 IU daily -Recommend weight-bearing and muscle strengthening exercises for building and maintaining bone density. - seated exercises, pedals -Recommended to continue current medication  Idiopathic pulmonary fibrosis (Goal: manage symptoms) -Not ideally controlled - pt reports severe coughing w/ mucus;  Delsym/OTC cough did not help; cough drops have helped some -Follows with pulmonary, Dr Raul Del. 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 She should qualify for Esbriet [place] [employer]) pt assistance. Mailed forms to patient and she will bring to prescriber to complete -Recommend to continue current medication  Chronic pain (Goal: manage pain) -Controlled -Follows with pain mgmt, Dr Dossie Arbour; hx lumbar DDD, compression fracture of L1, L3, L4, T12; s/p hip replacement; shoulder pain; cervicalgia; pt gets steroid injections periodically -Hydrocodone is prescribed by NP Allyson Sabal ([facility]) separate from Dr Dossie Arbour; pt is interested in consolidating her pain mgmt providers so she does not have to go to 2 providers -Current treatment  Hydrocodone-APAP 5-325 mg BID prn - #60 per month Voltaren gel (shoulder) -Advised pt to discuss with Dr Dossie Arbour about consolidating providers/asking him to take over prescribing of hydrocodone -Recommended to continue current medication  Ulcerative colitis (Goal: manage symptoms, prevent flares) -Not ideally controlled - pt reports persistent diarrhea after Cdiff treatment 11/2. She reports she feels better overall but still have diarrhea multiple times daily; she has not reported this to GI yet - per chart GI has talked to her 12/1 and she had told them she was doing better - per pt she did not mean that she was doing "good", just better -Follows with GI, Dr Vicente Males. Hx of recurrent  C.Diff -Current treatment  Sulfasalazine 500 mg BID -Advised pt to contact GI regarding persistent diarrhea -Recommend to continue current medication  Health Maintenance -Vaccine gaps: Flu, covid booster, Shingrix, TDAP -Advised flu vaccine at next office visit or at pharamacy -Current therapy:  Vitamin B12 500  mcg QOD Magnesium oxide 500 mg BID -Patient is satisfied with current therapy and denies issues -Recommended to continue current medication  Patient Goals/Self-Care Activities Patient will:  - take medications as prescribed as evidenced by patient report and record review focus on medication adherence by pill box collaborate with provider on medication access solutions (Bring Esbriet forms to Pulmonary) -Contact GI regarding persistent diarrhea -Discuss consolidating pain management with Dr Dossie Arbour      Ms. Gherardi was given information about Chronic Care Management services today including:  CCM service includes personalized support from designated clinical staff supervised by her physician, including individualized plan of care and coordination with other care providers 24/7 contact phone numbers for assistance for urgent and routine care needs. Standard insurance, coinsurance, copays and deductibles apply for chronic care management only during months in which we provide at least 20 minutes of these services. Most insurances cover these services at 100%, however patients may be responsible for any copay, coinsurance and/or deductible if applicable. This service may help you avoid the need for more expensive face-to-face  services. Only one practitioner may furnish and bill the service in a calendar month. The patient may stop CCM services at any time (effective at the end of the month) by phone call to the office staff.  Patient agreed to services and verbal consent obtained.   Patient verbalizes understanding of instructions provided today and agrees to view in [place].   Telephone follow up appointment with pharmacy team member scheduled for: 3 months  Charlene Brooke, PharmD, [facility] Clinical Pharmacist [facility] [phone]

## 2021-08-15 DIAGNOSIS — Z79899 Other long term (current) drug therapy: Secondary | ICD-10-CM | POA: Diagnosis not present

## 2021-08-15 DIAGNOSIS — G894 Chronic pain syndrome: Secondary | ICD-10-CM | POA: Diagnosis not present

## 2021-08-15 DIAGNOSIS — M25551 Pain in right hip: Secondary | ICD-10-CM | POA: Diagnosis not present

## 2021-08-15 DIAGNOSIS — M25519 Pain in unspecified shoulder: Secondary | ICD-10-CM | POA: Diagnosis not present

## 2021-08-15 DIAGNOSIS — M5416 Radiculopathy, lumbar region: Secondary | ICD-10-CM | POA: Diagnosis not present

## 2021-08-16 ENCOUNTER — Other Ambulatory Visit: Payer: Self-pay

## 2021-08-16 ENCOUNTER — Ambulatory Visit: Payer: Medicare Other | Attending: Pain Medicine | Admitting: Pain Medicine

## 2021-08-16 DIAGNOSIS — S32010S Wedge compression fracture of first lumbar vertebra, sequela: Secondary | ICD-10-CM | POA: Diagnosis not present

## 2021-08-16 DIAGNOSIS — S22080S Wedge compression fracture of T11-T12 vertebra, sequela: Secondary | ICD-10-CM | POA: Diagnosis not present

## 2021-08-16 DIAGNOSIS — Z79891 Long term (current) use of opiate analgesic: Secondary | ICD-10-CM

## 2021-08-16 DIAGNOSIS — R1031 Right lower quadrant pain: Secondary | ICD-10-CM | POA: Diagnosis not present

## 2021-08-16 DIAGNOSIS — G8929 Other chronic pain: Secondary | ICD-10-CM | POA: Diagnosis not present

## 2021-08-16 DIAGNOSIS — G894 Chronic pain syndrome: Secondary | ICD-10-CM | POA: Diagnosis not present

## 2021-08-16 DIAGNOSIS — M545 Low back pain, unspecified: Secondary | ICD-10-CM | POA: Diagnosis not present

## 2021-08-16 DIAGNOSIS — M5416 Radiculopathy, lumbar region: Secondary | ICD-10-CM | POA: Diagnosis not present

## 2021-08-16 DIAGNOSIS — R937 Abnormal findings on diagnostic imaging of other parts of musculoskeletal system: Secondary | ICD-10-CM | POA: Diagnosis not present

## 2021-08-16 DIAGNOSIS — S32030S Wedge compression fracture of third lumbar vertebra, sequela: Secondary | ICD-10-CM

## 2021-08-16 DIAGNOSIS — S32040S Wedge compression fracture of fourth lumbar vertebra, sequela: Secondary | ICD-10-CM

## 2021-08-16 NOTE — Progress Notes (Signed)
Patient: [name]  Service Category: E/M  Provider: Gaspar Cola, MD  DOB: 06-Sep-1935  DOS: 08/16/2021  Location: Office  MRN: [id number]  Setting: Ambulatory outpatient  Referring Provider: [name], NP  Type: Established Patient  Specialty: Interventional Pain Management  PCP: [name], NP  Location: Remote location  Delivery: TeleHealth     Virtual Encounter - Pain Management PROVIDER NOTE: Information contained herein reflects review and annotations entered in association with encounter. Interpretation of such information and data should be left to medically-trained personnel. Information provided to patient can be located elsewhere in the medical record under "Patient Instructions". Document created using STT-dictation technology, any transcriptional errors that may result from process are unintentional.    Contact & Pharmacy Preferred: [phone] Home: [phone] (home) Mobile: [phone] (mobile) E-mail: [email or web address]  [id number]: [phone]: [phone]  Pre-screening  [name] "in-person" vs "virtual" encounter. She indicated preferring virtual for this encounter.   Reason COVID-19*   Social distancing based on CDC and AMA recommendations.   I contacted SJoycelyn [name] 08/16/2021 via telephone.      I clearly identified myself as FGaspar Cola MD. I verified that I was speaking with the correct person using two identifiers (Name: [name] and date of birth: [birth year].  Consent I sought verbal advanced consent from SJoycelyn Dasfor virtual visit interactions. I informed Ms. TBasinskiof possible security and privacy concerns, risks, and limitations associated with providing "not-in-person" medical evaluation and management services. I also informed Ms. TComunaleof the availability of "in-person"  appointments. Finally, I informed her that there would be a charge for the virtual visit and that she could be  personally, fully or partially, financially responsible for it. Ms. TMcduffeeexpressed understanding and agreed to proceed.   Historic Elements   Ms. SMALIJAH LIETZis a 85y.o. year old, female patient evaluated today after our last contact on 07/31/2021. [name] has a past medical history of Altered mental state, Anxiety, Basal cell carcinoma (05/03/2021), BMI 30.0-30.9,adult, Cellulitis, Chest pain, atypical, Compression fracture of L4 lumbar vertebra, Constipation, Cystitis, Cystocele, Diverticulosis, Fatigue, Fibrosis, idiopathic pulmonary ([place], Gastroenteritis, History of back surgery, History of herniated intervertebral disc, HTN (hypertension), Hypercholesteremia, Hypocalcemia, Incontinence of urine, OA (osteoarthritis), Obesity, Osteopenia, Pneumonia, Shortness of breath, Sleep apnea, Squamous cell carcinoma of skin (07/16/2016), Squamous cell carcinoma of skin (01/13/2018), Thrush, and Vitamin D deficiency. She also  has a past surgical history that includes Rotator cuff repair (Bilateral); Total shoulder replacement (Left); Cataract extraction w/PHACO (Left, 04/24/2016); Cataract extraction w/PHACO (Right, 07/14/2017); Esophagogastroduodenoscopy (egd) with propofol (N/A, 10/13/2018); Flexible sigmoidoscopy (N/A, 10/13/2018); Cholecystectomy; Tonsillectomy; Back surgery; Flexible sigmoidoscopy (N/A, 09/09/2019); Flexible sigmoidoscopy (N/A, 08/04/2020); Hip Arthroplasty (Right, 09/27/2020); and Cystoscopy/ureteroscopy/holmium laser/stent placement (Left, 04/27/2021). Ms. TArseneauhas a current medication list which includes the following prescription(Terri): atorvastatin, budesonide, calcium carbonate, vitamin d, citalopram, denosumab, esbriet, hydrocodone-acetaminophen, magnesium oxide, metoprolol succinate, mupirocin ointment, oxygen-helium, sulfasalazine, vitamin b-12, and voltaren. She   reports that she has never smoked. She has never used smokeless tobacco. She reports that she does not drink alcohol and does not use drugs. Ms. TGambrillhas No Known Allergies.   HPI  Today, she is being contacted for a post-procedure assessment.  The patient again indicated not having attained any benefit from the diagnostic bilateral lumbar facet block.  We have tried several approaches to her problem.  But based on the information that we have obtained from each of the diagnostic injections, it seems to be clear to me that the etiology of her pain recites within the thoracolumbar section of her spine.  She does have a retropulsion of the T12 vertebral body fracture which may be contributing to some of this bilateral low back pain.  Because it is above the level that we have injected it would make sense that he would not respond to the local anesthetic blockade.  Today I had a very long conversation with him with the patient where we went over the results of her MRI and everything that we had done and I have explained to her that it is very unlikely that her condition is going to go away.  I offered the option of referring her to a neurosurgeon but she indicated that she was not really interested in that due to her advanced age.  I then took the time to review her medications and from the review of the PMP, it looks like she has tried some hydrocodone/APAP 5/325 as well as 7.5/325.  She is currently taking the 5/325 when she indicates that when she takes the pill it takes approximately 30 minutes to begin to work, but at its peak it barely get rid of 50% of her pain.  She denies any side effects such as somnolence, cognitive impairment, or any other complaints that may be associated with her dose being too high.  Looking at the PMP, it looks like she has taken in the past 7.5/325 up to 4 times a day.  She indicates that when she takes the 5/325 it would only last for about 2 hours.  This makes me think that  she may be a rapid metabolizer.  The PMP also shows the patient to have tried some oxycodone and hydromorphone and she indicates that neither 1 gave her any type of side effects.  She does seem to remember that the hydromorphone 4 mg p.o. 3 times daily seem to have worked better than all of the other.  However, she also says that this will was being prescribed by Dr. Rhys Martini at [facility], but when he left the practice, her care was taken over by Danise Mina, ANP, who switched her from the first time to the hydrocodone/APAP.  Today the patient has asked me if I would be willing to take over her medication management and I have indicated to her that I would be happy to take a look at her medications and perhaps assist her in this matter.  She refers that she currently has 3 months worth of prescriptions from her nurse practitioner and therefore were not in a hurry to bring her in to take over.  Today she confessed to me that she has not communicated to her that she is also seen [place].  I told her that she should always let all of her physicians and healthcare providers know what she is doing, especially with the treatment of conditions that may require controlled substances.  She understood and accepted.  Post-Procedure Evaluation  Procedure(Terri):    Procedure:          Type: Lumbar Facet, Medial Branch Block(Terri)          Primary Purpose: Diagnostic/Therapeutic Region: Posterolateral Lumbosacral Spine Level: L2, L3, L4, L5, & S1 Medial Branch Level(Terri). Injecting these levels blocks the L3-4, L4-5, and L5-S1 lumbar facet joints. Laterality: Bilateral Anesthesia: Local (1-2% Lidocaine)  Anxiolysis: None  Sedation:  None  Guidance: Fluoroscopy          Indications: 1. Lumbar facet syndrome   2. Lumbosacral facet arthropathy (Multilevel) (Bilateral)   3. Spondylosis without myelopathy or radiculopathy, lumbosacral region   4. DDD (degenerative disc disease), lumbosacral   5. Chronic  low back pain (1ry area of Pain) (Bilateral) (L>R) w/o sciatica    Pain Score: Pre-procedure: 8 /10 Post-procedure: 7 /10    Anxiolysis: Please see nurses note.  Effectiveness during initial hour after procedure (Ultra-Short Term Relief): 0 %.  Local anesthetic used: Long-acting (4-6 hours) Effectiveness: Defined as any analgesic benefit obtained secondary to the administration of local anesthetics. This carries significant diagnostic value as to the etiological location, or anatomical origin, of the pain. Duration of benefit is expected to coincide with the duration of the local anesthetic used.  Effectiveness during initial 4-6 hours after procedure (Short-Term Relief): 0 %.  Long-term benefit: Defined as any relief past the pharmacologic duration of the local anesthetics.  Effectiveness past the initial 6 hours after procedure (Long-Term Relief): 0 %.  Benefits, current: Defined as benefit present at the time of this evaluation.   Analgesia: No benefit Function: No benefit ROM: No benefit  Pharmacotherapy Assessment   Opioid Analgesic: No chronic opioid analgesics therapy prescribed by our practice. Hydrocodone/APAP 7.5/325, 1 tab p.o. 4 times daily (30 mg/day of hydrocodone) (filled on 02/07/2021) (22.5 MME) (prescribed by Danise Mina, ANP.) MME/day: 30 mg/day   Monitoring: [place] PMP: PDMP reviewed during this encounter.       Pharmacotherapy: No side-effects or adverse reactions reported. Compliance: No problems identified. Effectiveness: Clinically acceptable. Plan: Refer to "POC". UDS:  Summary  Date Value Ref Range Status  01/16/2021 Note  Final    Comment:    ==================================================================== Compliance Drug Analysis, Ur ==================================================================== Test                             Result       Flag       Units  Drug Present and Declared for Prescription Verification   Hydrocodone                     1275         EXPECTED   ng/mg creat   Dihydrocodeine                 287          EXPECTED   ng/mg creat   Norhydrocodone                 >3448        EXPECTED   ng/mg creat    Sources of hydrocodone include scheduled prescription medications.    Dihydrocodeine and norhydrocodone are expected metabolites of    hydrocodone. Dihydrocodeine is also available as a scheduled    prescription medication.    Citalopram                     PRESENT      EXPECTED   Desmethylcitalopram            PRESENT      EXPECTED    Desmethylcitalopram is an expected metabolite of citalopram or the    enantiomeric form, escitalopram.    Acetaminophen                  PRESENT      EXPECTED  Drug Present not Declared for Prescription Verification   Salicylate                     PRESENT      UNEXPECTED  Drug Absent but Declared for Prescription Verification   Diclofenac                     Not Detected UNEXPECTED    Diclofenac, as indicated in the declared medication list, is not    always detected even when used as directed.  ==================================================================== Test                      Result    Flag   Units      Ref Range   Creatinine              145              mg/dL      >=20 ==================================================================== Declared Medications:  The flagging and interpretation on this report are based on the  following declared medications.  Unexpected results may arise from  inaccuracies in the declared medications.   **Note: The testing scope of this panel includes these medications:   Citalopram (Celexa)  Hydrocodone (Norco)   **Note: The testing scope of this panel does not include small to  moderate amounts of these reported medications:   Acetaminophen (Norco)  Diclofenac (Voltaren)   **Note: The testing scope of this panel does not include the  following reported medications:   Cholecalciferol  Cyanocobalamin   Denosumab (Prolia)  Helium  Oxygen  Pirfenidone (Esbriet)  Sulfasalazine ==================================================================== For clinical consultation, please call [phone]. ====================================================================      Laboratory Chemistry Profile   Renal Lab Results  Component Value Date   BUN 24 (H) 04/21/2021   CREATININE 0.73 04/21/2021   BCR 23 09/21/2020   GFR 75.35 10/24/2020   GFRAA 76 09/21/2020   GFRNONAA >60 04/21/2021    Hepatic Lab Results  Component Value Date   AST 36 04/21/2021   ALT 25 04/21/2021   ALBUMIN 3.8 04/21/2021   ALKPHOS 69 04/21/2021   LIPASE 35 04/21/2021    Electrolytes Lab Results  Component Value Date   NA 139 04/21/2021   K 4.4 04/21/2021   CL 106 04/21/2021   CALCIUM 9.2 04/21/2021   MG 2.1 09/29/2020   PHOS 3.3 05/28/2020    Bone Lab Results  Component Value Date   VD25OH 37.79 10/24/2020   25OHVITD1 42 01/03/2021   25OHVITD2 <1.0 01/03/2021   25OHVITD3 42 01/03/2021    Inflammation (CRP: Acute Phase) (ESR: Chronic Phase) Lab Results  Component Value Date   CRP <1 01/03/2021   ESRSEDRATE 13 01/03/2021         Note: Above Lab results reviewed.  Imaging  DG PAIN CLINIC C-ARM 1-60 MIN NO REPORT Fluoro was used, but no Radiologist interpretation will be provided.  Please refer to "NOTES" tab for provider progress note.  Assessment  The primary encounter diagnosis was Chronic low back pain (1ry area of Pain) (Bilateral) (L>R) w/o sciatica. Diagnoses of Chronic radicular pain of lower back, Chronic groin pain (2ry area of Pain) (Right), Compression fracture of L1 lumbar vertebra, sequela, Compression fracture of L3 lumbar vertebra, sequela, Compression fracture of L4 lumbar vertebra, sequela, Compression fracture of T12 thoracic vertebra (w/ retropulsion), sequela, Abnormal MRI, lumbar spine (12/06/2020), Chronic pain syndrome, and Chronic use of opiate for therapeutic  purpose were also  pertinent to this visit.  Plan of Care  Problem-specific:  No problem-specific Assessment & Plan notes found for this encounter.  Ms. SHERREE SHANKMAN has a current medication list which includes the following long-term medication(Terri): atorvastatin, calcium carbonate, citalopram, metoprolol succinate, and sulfasalazine.  Pharmacotherapy (Medications Ordered): No orders of the defined types were placed in this encounter.  Orders:  No orders of the defined types were placed in this encounter.  Follow-up plan:   Return in about 6 weeks (around 09/27/2021) for Eval-day (M,W), (F2F) to consider MM.     Interventional Therapies  Risk   Complexity Considerations:   Estimated body mass index is 24.61 kg/m as calculated from the following:   Height as of this encounter: 5' (1.524 m).   Weight as of this encounter: 126 lb (57.2 kg). WNL   Planned   Pending:   Diagnostic bilateral lumbar facet block #3    Under consideration:   Diagnostic right L2 and L3 transforaminal ESI #1  Possible left suprascapular nerve RFA #1  Diagnostic right IA glenohumeral and AC joint injection #1    Completed:   Diagnostic bilateral lumbar facet MBB x3 (07/31/2021) (0/0/0/0)  Diagnostic/therapeutic left T12 & L1 TFESI x1 (03/20/2021) (100/100/0/0)  Diagnostic/therapeutic bilateral L3 TFESI x1 (02/13/2021) (50/50/0/0) Diagnostic/therapeutic left L4-5 LESI x1 (02/13/2021) (50/50/0/0)  Diagnostic/therapeutic left suprascapular NB x1 (05/17/2021) (100/50/10/0)   Therapeutic   Palliative (PRN) options:   None established    Recent Visits Date Type Provider Dept  07/31/21 Procedure visit Milinda Pointer, MD [facility]  07/24/21 Office Visit Milinda Pointer, MD [facility]  07/03/21 Office Visit Milinda Pointer, MD [facility]  06/19/21 Procedure visit Milinda Pointer, MD [facility]  06/05/21 Office Visit Milinda Pointer, MD [facility]  Showing recent visits within past 90 days and meeting all other requirements Today'Terri Visits Date Type Provider Dept  08/16/21 Office Visit Milinda Pointer, MD [facility]  Showing today'Terri visits and meeting all other requirements Future Appointments No visits were found meeting these conditions. Showing future appointments within next 90 days and meeting all other requirements I discussed the assessment and treatment plan with the patient. The patient was provided an opportunity to ask questions and all were answered. The patient agreed with the plan and demonstrated an understanding of the instructions.  Patient advised to call back or seek an in-person evaluation if the symptoms or condition worsens.  Duration of encounter: 41 minutes.  Note by: Gaspar Cola, MD Date: 08/16/2021; Time: 3:05 PM

## 2021-08-28 ENCOUNTER — Ambulatory Visit: Payer: Medicare Other | Admitting: Gastroenterology

## 2021-08-28 DIAGNOSIS — R059 Cough, unspecified: Secondary | ICD-10-CM | POA: Diagnosis not present

## 2021-08-28 DIAGNOSIS — K219 Gastro-esophageal reflux disease without esophagitis: Secondary | ICD-10-CM | POA: Diagnosis not present

## 2021-08-30 ENCOUNTER — Ambulatory Visit (INDEPENDENT_AMBULATORY_CARE_PROVIDER_SITE_OTHER): Payer: Medicare Other | Admitting: Gastroenterology

## 2021-08-30 ENCOUNTER — Encounter: Payer: Self-pay | Admitting: Gastroenterology

## 2021-08-30 VITALS — BP 125/69 | HR 71 | Temp 98.1°F | Ht 60.0 in | Wt 123.0 lb

## 2021-08-30 DIAGNOSIS — A0472 Enterocolitis due to Clostridium difficile, not specified as recurrent: Secondary | ICD-10-CM

## 2021-08-30 DIAGNOSIS — K5190 Ulcerative colitis, unspecified, without complications: Secondary | ICD-10-CM

## 2021-08-30 MED ORDER — FIDAXOMICIN 200 MG PO TABS
200.0000 mg | ORAL_TABLET | Freq: Two times a day (BID) | ORAL | 0 refills | Status: AC
Start: 2021-08-30 — End: 2021-09-09

## 2021-08-30 NOTE — Progress Notes (Signed)
[name] MD, MRCP(U.K) [address]  [place]  [place], [place] [address]  Main: [phone]  Fax: [phone]   Primary Care Physician: [name], NP  Primary Gastroenterologist:  Dr. [name]   Chief complaint: Follow-up for ulcerative colitis  HPI: [name] is a 85 y.o. female  Summary of history :   She was initially referred by her primary care physician in January 2020 with  diarrhea for over 2 months.  Since approximately early 2021 she has been on treatment with sulfasalazine for ulcerative pancolitis   She was seen in 2018 by [facility]  in 08/2008 she had a colonoscopy as per the last GI note showed chronic colitis with focal cryptitis.  Focal active colitis.   Not sure why it was not felt that this patient had inflammatory bowel disease.  At her initial visit she had been on Meloxicam for about 2-3  Months, 2 tablets a day and no other NSAID's.  She also did have blood in her stool at that point of time.   10/13/2018: EGD: normal . Sigmoidoscopy Discontinuous areas of nonbleeding ulcerated mucosa with no stigmata of recent bleeding were present in the sigmoid colon, in thedescending colon and in the distal transverse colon. Biopsies were taken with a cold forceps for histology.Rectum was normal. Pathology report showed chronic active colitis and normal rectal biopsies. HSV and CMV negative.    In February 2021 when she came to see me she had stopped meloxicam and doing well.  Was not having any diarrhea.   09/09/2019: Sigmoidoscopy: I went all the way to her mid transverse colon.  No evidence of colitis was seen.  There was a single solitary ulcer in the proximal descending colon.  No bleeding was seen.  Biopsies were taken with forceps.  And it demonstrated chronic colitis with mild to moderate activity and small crypt abscess. 09/13/2019: Vitamin D normal, hemoglobin 13.7 with a platelet count of 224 and a white cell count of 9.8.  CMP was  normal.   06/02/2020 CRP 0.7, CMP showed a calcium of 8.7 normal AST and ALT.  Hemoglobin on 06/01/2020 showed a normal study with normal differential count    Treated in  07/03/2020 for C. difficile diarrhea.  on 07/11/2020 informed that the diarrhea was recurring when she went off the vancomycin.  I subsequently suggested her to increase her dose back to 4 times a day for 3 weeks with a plan to gradually taper off.   08/04/2020: Flexible sigmoidoscopy: The mucosa of the entire examined colon appeared normal diverticulosis noted.  Biopsies taken.  Showed focal mild active colitis in the left colon and the rectum showed no active colitis.  01/03/2021: CRP less than 1, vitamin D normal, B12 normal 10/24/2020: Hemoglobin 12.8 g.    Interval history 02/22/2021-08/30/2021   03/03/2021: Stool positive for C. difficile commenced on Dificid.  Since she commenced the Dificid which she completed states significant improvement of symptoms but not completely resolved.  She feels that she probably needed a few more days of treatment.  Tried to call her office but could not coordinate the same.  Taking the sulfasalazine.  Denies any blood in her stool.      Current Outpatient Medications  Medication Sig Dispense Refill   atorvastatin (LIPITOR) 20 MG tablet Take 1 tablet (20 mg total) by mouth daily. For cholesterol. 90 tablet 3   budesonide (PULMICORT) 180 MCG/ACT inhaler Inhale 2 puffs into the lungs 2 (  two) times daily.     Cholecalciferol (VITAMIN D) 125 MCG (5000 UT) CAPS Take 5,000 Units by mouth daily.     citalopram (CELEXA) 40 MG tablet TAKE 1 TABLET (40 MG TOTAL) BY MOUTH DAILY. FOR ANXIETY. 90 tablet 2   denosumab (PROLIA) 60 MG/ML SOSY injection Inject 60 mg into the skin every 6 (six) months.     ESBRIET 267 MG TABS Take 267 mg by mouth 3 (three) times daily with meals.     HYDROcodone-acetaminophen (NORCO/VICODIN) 5-325 MG tablet Take 1 tablet by mouth 2 (two) times daily as needed.     Magnesium  Oxide 500 MG CAPS Take 500 mg by mouth 2 (two) times daily.     metoprolol succinate (TOPROL-XL) 25 MG 24 hr tablet Take 25 mg by mouth every other day.     mupirocin ointment (BACTROBAN) 2 % Apply 1 application topically daily. With dressing changes 22 g 0   OXYGEN Inhale 2 L into the lungs at bedtime.     sulfaSALAzine (AZULFIDINE) 500 MG tablet Take 1 tablet (500 mg total) by mouth 2 (two) times daily. 180 tablet 3   vitamin B-12 (CYANOCOBALAMIN) 1000 MCG tablet Take 1,000 mcg by mouth every other day.     VOLTAREN 1 % GEL Apply 2 g topically in the morning, at noon, and at bedtime. Shoulders and back  2   calcium carbonate (OSCAL) 1500 (600 Ca) MG TABS tablet Take 1 tablet (1,500 mg total) by mouth 2 (two) times daily with a meal. 60 tablet 2   No current facility-administered medications for this visit.    Allergies as of 08/30/2021   (No Known Allergies)    ROS:  General: Negative for anorexia, weight loss, fever, chills, fatigue, weakness. ENT: Negative for hoarseness, difficulty swallowing , nasal congestion. CV: Negative for chest pain, angina, palpitations, dyspnea on exertion, peripheral edema.  Respiratory: Negative for dyspnea at rest, dyspnea on exertion, cough, sputum, wheezing.  GI: See history of present illness. GU:  Negative for dysuria, hematuria, urinary incontinence, urinary frequency, nocturnal urination.  Endo: Negative for unusual weight change.    Physical Examination:   BP 125/69 (BP Location: Left Arm, Patient Position: Sitting, Cuff Size: Normal)    Pulse 71    Temp 98.1 F (36.7 C) (Oral)    Ht 5' (1.524 m)    Wt 123 lb (55.8 kg)    BMI 24.02 kg/m   General: Well-nourished, well-developed in no acute distress.  Eyes: No icterus. Conjunctivae pink. Neuro: Alert and oriented x 3.  Grossly intact. Skin: Warm and dry, no jaundice.   Psych: Alert and cooperative, normal mood and affect.   Imaging Studies: DG PAIN CLINIC C-ARM 1-60 MIN NO  REPORT  Result Date: 07/31/2021 Fluoro was used, but no Radiologist interpretation will be provided. Please refer to "NOTES" tab for provider progress note.   Assessment and Plan:   [name] is a 85 y.o. y/o female here to follow-up for chronic ulcerative colitis and has been doing well on sulfasalazine with no other GI symptoms.  Recent history of recurrent C. difficile colitis treated with vancomycin in 2021 and in November 2022 treated with Dificid..  She is up-to-date with her tetanus, influenza, DEXA scan and pneumococcal vaccination.  Health maintenance indicates she she has received Shingrix dose in 04/21/2020 and COVID vaccination.   Plan 1.   Continue sulfasalazine 2 tablets a day refill provided 2.  Would recommend [name], NP to give her pneumococcal  vaccine if she is not up-to-date as she is immunocompromised 3.  Check CBC and CRP 4.  Her symptoms of diarrhea could be related to the C. difficile or it could be related to exacerbation of her ulcerative colitis.  I will give her 10 more days of Dificid in addition I would perform a unsedated flexible sigmoidoscopy next week to assess the mucosa of the colon.  I have discussed alternative options, risks & benefits,  which include, but are not limited to, bleeding, infection, perforation,respiratory complication & drug reaction.  The patient agrees with this plan & written consent will be obtained.       Dr [name]  MD,MRCP [facility]) Follow up in 4 to 6 weeks

## 2021-08-30 NOTE — Addendum Note (Signed)
Addended by: Eliseo Squires on: 08/30/2021 03:28 PM   Modules accepted: Orders

## 2021-08-31 ENCOUNTER — Ambulatory Visit
Admission: RE | Admit: 2021-08-31 | Discharge: 2021-08-31 | Disposition: A | Discharge status: Home / Self Care | Payer: Medicare Other | Attending: Gastroenterology | Admitting: Gastroenterology

## 2021-08-31 ENCOUNTER — Encounter
Admission: RE | Disposition: A | Discharge status: Home / Self Care | Payer: Self-pay | Source: Home / Self Care | Attending: Gastroenterology

## 2021-08-31 DIAGNOSIS — K5190 Ulcerative colitis, unspecified, without complications: Secondary | ICD-10-CM | POA: Diagnosis not present

## 2021-08-31 DIAGNOSIS — K5130 Ulcerative (chronic) rectosigmoiditis without complications: Secondary | ICD-10-CM | POA: Insufficient documentation

## 2021-08-31 DIAGNOSIS — K5100 Ulcerative (chronic) pancolitis without complications: Secondary | ICD-10-CM | POA: Diagnosis present

## 2021-08-31 DIAGNOSIS — A0472 Enterocolitis due to Clostridium difficile, not specified as recurrent: Secondary | ICD-10-CM

## 2021-08-31 HISTORY — PX: FLEXIBLE SIGMOIDOSCOPY: SHX5431

## 2021-08-31 SURGERY — SIGMOIDOSCOPY, FLEXIBLE

## 2021-08-31 MED ORDER — SODIUM CHLORIDE 0.9 % IV SOLN: INTRAVENOUS | Status: DC

## 2021-08-31 NOTE — H&P (Signed)
[name], MD [address] [place], [place], [place], [address] [address], [place], [place], [place], [address] Phone: [phone]  Fax: [phone]  Primary Care Physician:  [name], NP   Pre-Procedure History & Physical: HPI:  [name] is a 85 y.o. female is here for a flexible sigmoidoscopy   Past Medical History:  Diagnosis Date   Altered mental state    Anxiety    Basal cell carcinoma 05/03/2021   right neck postauricular - Excised 06/12/21   BMI 30.0-30.9,adult    Cellulitis    Chest pain, atypical    Compression fracture of L4 lumbar vertebra    Constipation    Cystitis    Cystocele    Diverticulosis    Fatigue    Fibrosis, idiopathic pulmonary (HCC)    Gastroenteritis    History of back surgery    History of herniated intervertebral disc    HTN (hypertension)    Hypercholesteremia    Hypocalcemia    Incontinence of urine    OA (osteoarthritis)    shoulder and back   Obesity    Osteopenia    Pneumonia    Shortness of breath    Sleep apnea    Squamous cell carcinoma of skin 07/16/2016   Right upper lateral eyebrow. SCCis.   Squamous cell carcinoma of skin 01/13/2018   Left temporal hairline. WD SCC with superficial infiltration.   Thrush    Vitamin D deficiency     Past Surgical History:  Procedure Laterality Date   BACK SURGERY     CATARACT EXTRACTION W/PHACO Left 04/24/2016   Procedure: CATARACT EXTRACTION PHACO AND INTRAOCULAR LENS PLACEMENT (IOC);  Surgeon: Leandrew Koyanagi, MD;  Location: [facility];  Service: Ophthalmology;  Laterality: Left;   CATARACT EXTRACTION W/PHACO Right 07/14/2017   Procedure: CATARACT EXTRACTION PHACO AND INTRAOCULAR LENS PLACEMENT ([facility])  RIGHT;  Surgeon: Leandrew Koyanagi, MD;  Location: [facility];  Service: Ophthalmology;  Laterality: Right;   CHOLECYSTECTOMY     CYSTOSCOPY/URETEROSCOPY/HOLMIUM LASER/STENT PLACEMENT Left 04/27/2021   Procedure:  CYSTOSCOPY/URETEROSCOPY/HOLMIUM LASER/STENT PLACEMENT;  Surgeon: Billey Co, MD;  Location: [facility];  Service: Urology;  Laterality: Left;   ESOPHAGOGASTRODUODENOSCOPY (EGD) WITH PROPOFOL N/A 10/13/2018   Procedure: ESOPHAGOGASTRODUODENOSCOPY (EGD) WITH PROPOFOL;  Surgeon: [name], MD;  Location: [facility] ENDOSCOPY;  Service: Gastroenterology;  Laterality: N/A;   FLEXIBLE SIGMOIDOSCOPY N/A 10/13/2018   Procedure: FLEXIBLE SIGMOIDOSCOPY;  Surgeon: [name], MD;  Location: [facility] ENDOSCOPY;  Service: Gastroenterology;  Laterality: N/A;   FLEXIBLE SIGMOIDOSCOPY N/A 09/09/2019   Procedure: FLEXIBLE SIGMOIDOSCOPY;  Surgeon: [name], MD;  Location: [facility] ENDOSCOPY;  Service: Gastroenterology;  Laterality: N/A;   FLEXIBLE SIGMOIDOSCOPY N/A 08/04/2020   Procedure: FLEXIBLE SIGMOIDOSCOPY;  Surgeon: [name], MD;  Location: [facility] ENDOSCOPY;  Service: Gastroenterology;  Laterality: N/A;  Per Dr. Vicente Males, unsedated   HIP ARTHROPLASTY Right 09/27/2020   Procedure: ARTHROPLASTY BIPOLAR HIP (HEMIARTHROPLASTY);  Surgeon: Hessie Knows, MD;  Location: [facility];  Service: Orthopedics;  Laterality: Right;   ROTATOR CUFF REPAIR Bilateral    left-12/2009; right-03/2009 dr.califf   TONSILLECTOMY     TOTAL SHOULDER REPLACEMENT Left     Prior to Admission medications   Medication Sig Start Date End Date Taking? Authorizing Provider  atorvastatin (LIPITOR) 20 MG tablet Take 1 tablet (20 mg total) by mouth daily. For cholesterol. 03/02/21  Yes [name], NP  citalopram (CELEXA) 40 MG tablet TAKE 1 TABLET (40 MG TOTAL) BY MOUTH DAILY. FOR  ANXIETY. 07/08/21  Yes [name], NP  metoprolol succinate (TOPROL-XL) 25 MG 24 hr tablet Take 25 mg by mouth every other day. 05/09/21  Yes [provider]  sulfaSALAzine (AZULFIDINE) 500 MG tablet Take 1 tablet (500 mg total) by mouth 2 (two) times daily. 07/09/21 01/05/22 Yes [name], MD  budesonide (PULMICORT) 180 MCG/ACT inhaler Inhale 2 puffs into the  lungs 2 (two) times daily.    [provider]  calcium carbonate (OSCAL) 1500 (600 Ca) MG TABS tablet Take 1 tablet (1,500 mg total) by mouth 2 (two) times daily with a meal. 04/24/21 08/15/21  Milinda Pointer, MD  Cholecalciferol (VITAMIN D) 125 MCG (5000 UT) CAPS Take 5,000 Units by mouth daily.    [provider]  denosumab (PROLIA) 60 MG/ML SOSY injection Inject 60 mg into the skin every 6 (six) months.    [provider]  ESBRIET 267 MG TABS Take 267 mg by mouth 3 (three) times daily with meals. 02/25/17   [provider]  fidaxomicin (DIFICID) 200 MG TABS tablet Take 1 tablet (200 mg total) by mouth 2 (two) times daily for 10 days. 08/30/21 09/09/21  [name], MD  HYDROcodone-acetaminophen (NORCO/VICODIN) 5-325 MG tablet Take 1 tablet by mouth 2 (two) times daily as needed. 07/17/21   [provider]  Magnesium Oxide 500 MG CAPS Take 500 mg by mouth 2 (two) times daily.    [provider]  mupirocin ointment (BACTROBAN) 2 % Apply 1 application topically daily. With dressing changes 06/12/21   Ralene Bathe, MD  OXYGEN Inhale 2 L into the lungs at bedtime.    [provider]  vitamin B-12 (CYANOCOBALAMIN) 1000 MCG tablet Take 1,000 mcg by mouth every other day.    [provider]  VOLTAREN 1 % GEL Apply 2 g topically in the morning, at noon, and at bedtime. Shoulders and back 12/23/17   [provider]    Allergies as of 08/30/2021   (No Known Allergies)    Family History  Problem Relation Age of Onset   COPD Mother    Cancer Brother        colon    Social History   Socioeconomic History   Marital status: Married    Spouse name: [name]   Number of children: 2   Years of education: Not on file   Highest education level: Not on file  Occupational History   Not on file  Tobacco Use   Smoking status: Never   Smokeless tobacco: Never  Vaping Use   Vaping Use: Never used  Substance and Sexual  Activity   Alcohol use: No   Drug use: No   Sexual activity: Never    Birth control/protection: None  Other Topics Concern   Not on file  Social History Narrative   Not on file   Social Determinants of Health   Financial Resource Strain: Low Risk    Difficulty of Paying Living Expenses: Not hard at all  Food Insecurity: No Food Insecurity   Worried About Charity fundraiser in the Last Year: Never true   [place] in the Last Year: Never true  Transportation Needs: No Transportation Needs   Lack of Transportation (Medical): No   Lack of Transportation (Non-Medical): No  Physical Activity: Inactive   Days of Exercise per Week: 0 days   Minutes of Exercise per Session: 0 min  Stress: Stress Concern Present   Feeling of Stress : To some  extent  Social Connections: Not on file  Intimate Partner Violence: Not At Risk   Fear of Current or Ex-Partner: No   Emotionally Abused: No   Physically Abused: No   Sexually Abused: No    Review of Systems: See HPI, otherwise negative ROS  Physical Exam: There were no vitals taken for this visit. General:   Alert,  pleasant and cooperative in NAD Head:  Normocephalic and atraumatic. Neck:  Supple; no masses or thyromegaly. Lungs:  Clear throughout to auscultation, normal respiratory effort.    Heart:  +S1, +S2, Regular rate and rhythm, No edema. Abdomen:  Soft, nontender and nondistended. Normal bowel sounds, without guarding, and without rebound.   Neurologic:  Alert and  oriented x4;  grossly normal neurologically.  Impression/Plan: [name] is here for a flexible sigmoidoscopy to be performed for  evaluation of ulcerative colitis. Risks, benefits, limitations, and alternatives regarding  colonoscopy have been reviewed with the patient.  Questions have been answered.  All parties agreeable.   [name], MD  08/31/2021, 10:11 AM

## 2021-08-31 NOTE — Op Note (Signed)
[facility] Gastroenterology Patient Name: [name] Procedure Date: 08/31/2021 10:16 AM MRN: [id number] Account #: [id number] Date of Birth: Mar 18, 1936 Admit Type: Outpatient Age: 85 Room: [facility] ENDO ROOM 4 Gender: Female Note Status: Finalized Instrument Name: Peds Colonoscope [id number] Procedure:             Flexible Sigmoidoscopy Indications:           Follow-up of chronic ulcerative pancolitis Providers:             [name] MD, MD Referring MD:          [name] (Referring MD) Medicines:             None Complications:         No immediate complications. Procedure:             Pre-Anesthesia Assessment:                        - Prior to the procedure, a History and Physical was                         performed, and patient medications, allergies and                         sensitivities were reviewed. The patient's tolerance                         of previous anesthesia was reviewed.                        - The risks and benefits of the procedure and the                         sedation options and risks were discussed with the                         patient. All questions were answered and informed                         consent was obtained.                        - ASA Grade Assessment: III - A patient with severe                         systemic disease.                        After obtaining informed consent, the scope was passed                         under direct vision. The Colonoscope was introduced                         through the anus and advanced to the the left                         transverse colon. The flexible sigmoidoscopy was  accomplished with ease. The patient tolerated the                         procedure well. The quality of the bowel preparation                         was excellent. Findings:      The perianal and digital rectal examinations were normal.      Normal mucosa was  found in the left colon. Biopsies were taken with a       cold forceps for histology. Impression:            - Normal mucosa in the left colon. Biopsied. Recommendation:        - Discharge patient to home (with escort).                        - Advance diet as tolerated.                        - Return to my office in 2 weeks.                        - Will plan to start her on Zinplava Procedure Code(s):     --- Professional ---                        [phone], Sigmoidoscopy, flexible; with biopsy, single or                         multiple Diagnosis Code(s):     --- Professional ---                        K51.00, Ulcerative (chronic) pancolitis without                         complications CPT copyright 2019 American Medical Association. All rights reserved. The codes documented in this report are preliminary and upon coder review may  be revised to meet current compliance requirements. [name], MD [name] MD, MD 08/31/2021 10:32:03 AM This report has been signed electronically. Number of Addenda: 0 Note Initiated On: 08/31/2021 10:16 AM Total Procedure Duration: 0 hours 5 minutes 16 seconds  Estimated Blood Loss:  Estimated blood loss: none.      [facility]

## 2021-09-04 ENCOUNTER — Telehealth: Payer: Self-pay

## 2021-09-04 ENCOUNTER — Other Ambulatory Visit: Payer: Self-pay

## 2021-09-04 ENCOUNTER — Encounter: Payer: Self-pay | Admitting: Gastroenterology

## 2021-09-04 LAB — SURGICAL PATHOLOGY

## 2021-09-04 MED ORDER — ZEPOSIA 7-DAY STARTER PACK 4 X 0.23MG & 3 X 0.46MG PO CPPK: 1.0000 | ORAL_CAPSULE | Freq: Every day | ORAL | 0 refills | Status: DC

## 2021-09-04 MED ORDER — ZEPOSIA 0.92 MG PO CAPS: 1.0000 | ORAL_CAPSULE | Freq: Every day | ORAL | 11 refills | Status: DC

## 2021-09-04 NOTE — Telephone Encounter (Signed)
Called patient to let her know what Dr. Vicente Males stated below. However, patient was confused because she stated that she was to continue taking Deficid and when she came in for her follow up appointment, she and Dr. Vicente Males would discuss what would be her plan. Dr. Vicente Males, can you please advise. FYI: Patient will come in sometime this week to sign the [employer] access program form to see if she qualifies for Zinplava's assistance.

## 2021-09-04 NOTE — Telephone Encounter (Signed)
-----   Message from [name], MD sent at 08/31/2021 10:33 AM EST ----- [name]/[name]  This patient needs to be started on Zinplava - the drug rep needs to be contacted to come and discuss or call me to get the process started. Used to be the guy called Lenny - not sure if same- can check with Ginger if needed  F/u in my office in 2-3 weeks

## 2021-09-05 ENCOUNTER — Telehealth: Payer: Self-pay

## 2021-09-05 NOTE — Telephone Encounter (Signed)
Forms for patient assistance for [employer] were faxed today for Zinplava and [place]. They will contact the patient to let her know if she could get qualified.

## 2021-09-05 NOTE — Telephone Encounter (Signed)
yes needs to continue dificid but zinplava will help prevent the c diff from returning and getting another infection

## 2021-09-05 NOTE — Telephone Encounter (Signed)
Called patient but had to leave her a voicemail letting her know that she is to continue taking the Dificid and eventually start taking Zinplava. I also stated that she is to come in to her follow up appointment to discuss further.

## 2021-09-09 NOTE — Progress Notes (Signed)
Inform biopsies show only mild inflammation- follow up as per last office note

## 2021-09-16 DIAGNOSIS — J449 Chronic obstructive pulmonary disease, unspecified: Secondary | ICD-10-CM | POA: Diagnosis not present

## 2021-09-17 ENCOUNTER — Encounter: Payer: Self-pay | Admitting: Gastroenterology

## 2021-09-17 ENCOUNTER — Ambulatory Visit (INDEPENDENT_AMBULATORY_CARE_PROVIDER_SITE_OTHER): Payer: Medicare HMO | Admitting: Gastroenterology

## 2021-09-17 ENCOUNTER — Ambulatory Visit: Payer: Medicare Other | Admitting: Pain Medicine

## 2021-09-17 VITALS — BP 134/78 | HR 82 | Temp 98.3°F | Wt 127.0 lb

## 2021-09-17 DIAGNOSIS — A0472 Enterocolitis due to Clostridium difficile, not specified as recurrent: Secondary | ICD-10-CM

## 2021-09-17 NOTE — Progress Notes (Signed)
Terri Bellows MD, MRCP(U.K) [address]  [place]  [place], [name] [address]  Main: [phone]  Fax: [phone]   Primary Care Physician: [name], NP  Primary Gastroenterologist:  Dr. Jonathon [name]   C/c : Ulcerative colitis   HPI: [name] is a 86 y.o. female  Summary of history :   Since approximately early 2021 she has been on treatment with sulfasalazine for ulcerative pancolitis.Has had a few episodes of recurrent C diff colitis.  She was seen in 2018 by [facility] GI  in 08/2008 she had a colonoscopy as per the last GI note showed chronic colitis with focal cryptitis.  Focal active colitis.   Not sure why it was not felt that this patient had inflammatory bowel disease.  At her initial visit she had been on Meloxicam for about 2-3  Months, 2 tablets a day and no other NSAID's.  She also did have blood in her stool at that point of time.   10/13/2018: EGD: normal . Sigmoidoscopy Discontinuous areas of nonbleeding ulcerated mucosa with no stigmata of recent bleeding were present in the sigmoid colon, in thedescending colon and in the distal transverse colon. Biopsies were taken with a cold forceps for histology.Rectum was normal. Pathology report showed chronic active colitis and normal rectal biopsies. HSV and CMV negative.    In February 2021 when she came to see me she had stopped meloxicam and doing well.  Was not having any diarrhea.   09/09/2019: Sigmoidoscopy: I went all the way to her mid transverse colon.  No evidence of colitis was seen.  There was a single solitary ulcer in the proximal descending colon.  No bleeding was seen.  Biopsies were taken with forceps.  And it demonstrated chronic colitis with mild to moderate activity and small crypt abscess. 09/13/2019: Vitamin D normal, hemoglobin 13.7 with a platelet count of 224 and a white cell count of 9.8.  CMP was normal.   06/02/2020 CRP 0.7, CMP showed a calcium of 8.7 normal AST and ALT.   Hemoglobin on 06/01/2020 showed a normal study with normal differential count       08/04/2020: Flexible sigmoidoscopy: The mucosa of the entire examined colon appeared normal diverticulosis noted.  Biopsies taken.  Showed focal mild active colitis in the left colon and the rectum showed no active colitis.  01/03/2021: CRP less than 1, vitamin D normal, B12 normal 10/24/2020: Hemoglobin 12.8 g.   Treated in  07/03/2020 for C. difficile diarrhea.  on 07/11/2020, 03/03/2021   Interval history 08/30/2021-09/17/2021   08/31/2021: Flexible sigmoidoscopy  Normal mucosa was found in the left colon. Biopsies were taken with a cold forceps for histology. Biopsies showed mild chronic proctocolitis.   She just commenced her second course of Dificid 2 days back.  This will be a longer course.  We are trying to get her Zinplava.  She has not yet received the approval.  Discussed with her about stool transplant which has been FDA approved to be done at the office which we will plan in the next few weeks.  She continues to have watery bowel movements once every time she eats.    Current Outpatient Medications  Medication Sig Dispense Refill   atorvastatin (LIPITOR) 20 MG tablet Take 1 tablet (20 mg total) by mouth daily. For cholesterol. 90 tablet 3   budesonide (PULMICORT) 180 MCG/ACT inhaler Inhale 2 puffs into the lungs 2 (two) times daily.     calcium carbonate (OSCAL)  1500 (600 Ca) MG TABS tablet Take 1 tablet (1,500 mg total) by mouth 2 (two) times daily with a meal. 60 tablet 2   Cholecalciferol (VITAMIN D) 125 MCG (5000 UT) CAPS Take 5,000 Units by mouth daily.     citalopram (CELEXA) 40 MG tablet TAKE 1 TABLET (40 MG TOTAL) BY MOUTH DAILY. FOR ANXIETY. 90 tablet 2   denosumab (PROLIA) 60 MG/ML SOSY injection Inject 60 mg into the skin every 6 (six) months.     ESBRIET 267 MG TABS Take 267 mg by mouth 3 (three) times daily with meals.     famotidine (PEPCID) 20 MG tablet SMARTSIG:1 Tablet(s) By Mouth  Every Evening     fidaxomicin (DIFICID) 200 MG TABS tablet Take 200 mg by mouth 2 (two) times daily.     HYDROcodone-acetaminophen (NORCO/VICODIN) 5-325 MG tablet Take 1 tablet by mouth 2 (two) times daily as needed.     Magnesium Oxide 500 MG CAPS Take 500 mg by mouth 2 (two) times daily.     metoprolol succinate (TOPROL-XL) 25 MG 24 hr tablet Take 25 mg by mouth every other day.     mupirocin ointment (BACTROBAN) 2 % Apply 1 application topically daily. With dressing changes 22 g 0   OXYGEN Inhale 2 L into the lungs at bedtime.     sulfaSALAzine (AZULFIDINE) 500 MG tablet Take 1 tablet (500 mg total) by mouth 2 (two) times daily. 180 tablet 3   vitamin B-12 (CYANOCOBALAMIN) 1000 MCG tablet Take 1,000 mcg by mouth every other day.     VOLTAREN 1 % GEL Apply 2 g topically in the morning, at noon, and at bedtime. Shoulders and back  2   No current facility-administered medications for this visit.    Allergies as of 09/17/2021   (No Known Allergies)    ROS:  General: Negative for anorexia, weight loss, fever, chills, fatigue, weakness. ENT: Negative for hoarseness, difficulty swallowing , nasal congestion. CV: Negative for chest pain, angina, palpitations, dyspnea on exertion, peripheral edema.  Respiratory: Negative for dyspnea at rest, dyspnea on exertion, cough, sputum, wheezing.  GI: See history of present illness. GU:  Negative for dysuria, hematuria, urinary incontinence, urinary frequency, nocturnal urination.  Endo: Negative for unusual weight change.    Physical Examination:   BP 134/78    Pulse 82    Temp 98.3 F (36.8 C) (Oral)    Wt 127 lb (57.6 kg)    BMI 24.80 kg/m   General: Well-nourished, well-developed in no acute distress.  Eyes: No icterus. Conjunctivae pink. Neuro: Alert and oriented x 3.  Grossly intact. Skin: Warm and dry, no jaundice.   Psych: Alert and cooperative, normal mood and affect.   Imaging Studies: No results found.  Assessment and Plan:    [name] is a 86 y.o. y/o female  here to follow-up for chronic ulcerative colitis and has been doing well on sulfasalazine with no other GI symptoms.  Recent history of recurrent C. difficile colitis treated with vancomycin in 2021 and in November 2022 treated with Dificid..  She is up-to-date with her tetanus, influenza, DEXA scan and pneumococcal vaccination.  Health maintenance indicates she she has received Shingrix dose in 04/21/2020 and COVID vaccination.  Presenting her main issue is recurrent C. difficile colitis.  He has presently started a long course of Dificid.   Plan 1.  Continue sulfasalazine 2 tablets a day refill provided 2.  Continue Dificid slow taper as previously ordered 3.  Proceed with Zinplava  to prevent C. difficile diarrhea  from recurring 4.  We will plan to perform stool transplant at the office with the new FDA approved delivery system.  I have explained to her that it will probably take 10 to 15 minutes to be performed as an outpatient required no sedation or preparation except holding her Dificid for at least 3 days.  Dr Terri Bellows  MD,MRCP [facility]) Follow up in 4 to 5 weeks

## 2021-09-18 ENCOUNTER — Telehealth: Payer: Self-pay | Admitting: Pain Medicine

## 2021-09-18 ENCOUNTER — Ambulatory Visit (INDEPENDENT_AMBULATORY_CARE_PROVIDER_SITE_OTHER): Payer: No Typology Code available for payment source | Admitting: Obstetrics and Gynecology

## 2021-09-18 ENCOUNTER — Encounter: Payer: Self-pay | Admitting: Obstetrics and Gynecology

## 2021-09-18 ENCOUNTER — Other Ambulatory Visit: Payer: Self-pay

## 2021-09-18 VITALS — BP 138/82 | HR 68 | Ht 60.0 in | Wt 125.0 lb

## 2021-09-18 DIAGNOSIS — B372 Candidiasis of skin and nail: Secondary | ICD-10-CM

## 2021-09-18 MED ORDER — NYSTATIN 100000 UNIT/GM EX CREA: 1.0000 "application " | TOPICAL_CREAM | Freq: Two times a day (BID) | CUTANEOUS | 1 refills | Status: DC

## 2021-09-18 NOTE — Telephone Encounter (Signed)
Patient instructed to call her PCP as Dr. Dossie Arbour no longer prescribed non-opioid medications.

## 2021-09-18 NOTE — Progress Notes (Signed)
° ° °  GYNECOLOGY PROGRESS NOTE  Subjective:    Patient ID: [name], female    DOB: April 28, 1936, 86 y.o.   MRN: [id number]  HPI  Patient is a 86 y.o. G2P2 female who presents for complaints of a rash on her abdomen for ~ 2-3 weeks. She notes that the rash also has an odor, is white and cottage-cheese like in appearance. Denies fevers, chills.   The following portions of the patient's history were reviewed and updated as appropriate: allergies, current medications, past family history, past medical history, past social history, past surgical history, and problem list.  Review of Systems Pertinent items noted in HPI and remainder of comprehensive ROS otherwise negative.   Objective:   Blood pressure 138/82, pulse 68, height 5' (1.524 m), weight 125 lb (56.7 kg), SpO2 98 %. Body mass index is 24.41 kg/m. General appearance: alert and no distress Abdomen: soft, non-tender; bowel sounds normal; no masses,  no organomegaly Skin: erythematous rash with white thick clumpy discharge in abdominal fold.    Assessment:   1. Yeast dermatitis      Plan:   1. Yeast dermatitis - Prescribed Nystatin cream to area BID.  Once rash has resolved, advised on use of daily powder or cornstarch to decrease moisture in area. Also advised on use of breathable materials such as cotton with clothing.   RTC in 2 weeks if no improvement in symptoms.     Rubie Maid, MD [facility]

## 2021-09-24 DIAGNOSIS — R0609 Other forms of dyspnea: Secondary | ICD-10-CM | POA: Diagnosis not present

## 2021-09-24 DIAGNOSIS — R053 Chronic cough: Secondary | ICD-10-CM | POA: Diagnosis not present

## 2021-09-24 DIAGNOSIS — J84112 Idiopathic pulmonary fibrosis: Secondary | ICD-10-CM | POA: Diagnosis not present

## 2021-09-24 DIAGNOSIS — Z9981 Dependence on supplemental oxygen: Secondary | ICD-10-CM | POA: Diagnosis not present

## 2021-09-25 NOTE — Progress Notes (Signed)
PROVIDER NOTE: Information contained herein reflects review and annotations entered in association with encounter. Interpretation of such information and data should be left to medically-trained personnel. Information provided to patient can be located elsewhere in the medical record under "Patient Instructions". Document created using STT-dictation technology, any transcriptional errors that may result from process are unintentional.    Patient: [name]  Service Category: E/M  Provider: Gaspar Cola, MD  DOB: 1935-09-16  DOS: 09/26/2021  Specialty: Interventional Pain Management  MRN: [id number]  Setting: Ambulatory outpatient  PCP: [name], NP  Type: Established Patient    Referring Provider: [name], NP  Location: Office  Delivery: Face-to-face     HPI  [name], a 86 y.o. year old female, is here today because of her Chronic pain syndrome [G89.4]. [name] primary complain today is Back Pain (low) and Shoulder Pain (Left and right., left is worse) Last encounter: My last encounter with her was on 09/18/2021. Pertinent problems: [name] has Arthritis; Compression fracture of L4 lumbar vertebra, sequela; Osteopetrosis; Subcapital fracture of femur, sequela (Right); Chronic pain syndrome; Chronic groin pain (2ry area of Pain) (Right); Chronic low back pain (1ry area of Pain) (Bilateral) (L>R) w/o sciatica; Chronic hip pain after total replacement (Right); Chronic shoulder pain (3ry area of Pain) (Bilateral) (L>R); Abnormal MRI, lumbar spine (12/06/2020); Compression fracture of T12 thoracic vertebra (w/ retropulsion), sequela; Compression fracture of L1 lumbar vertebra, sequela; Compression fracture of L3 lumbar vertebra, sequela; Lumbosacral facet arthropathy (Multilevel) (Bilateral); Lumbar facet syndrome; Spondylosis without myelopathy or radiculopathy, lumbosacral region; DDD (degenerative disc disease), lumbosacral; DDD (degenerative disc  disease), thoracolumbar; Chronic radicular pain of lower back; Lumbar spondylosis; Osteoporosis with current pathological fracture, sequela; Osteoporosis with pathological fracture of lumbar vertebra ([place]); Trigger point of shoulder region (Left); Tendinitis of triceps (Left); Chronic upper back pain; Chronic thoracic back pain (Left); Trigger point with back pain; Lower extremity weakness (Bilateral); Myofascial pain syndrome of lumbar spine; Myofascial pain syndrome of thoracic spine; Cervicalgia; Chronic shoulder pain (Left); History of total shoulder replacement (Left); Pain in shoulder region after shoulder replacement (Left); and History of arthroplasty of shoulder (Left) on their pertinent problem list. Pain Assessment: Severity of Chronic pain is reported as a 6 /10. Location: Back Lower/ . Onset: More than a month ago. Quality: Aching, Dull, Sharp, Stabbing. Timing: Intermittent. Modifying factor(s): heat, rest, topicals. Vitals:  height is 5' (1.524 m) and weight is 125 lb (56.7 kg). Her temporal temperature is 97.1 F (36.2 C) (abnormal). Her blood pressure is 138/73 and her pulse is 58 (abnormal). Her respiration is 18.   Reason for encounter: follow-up evaluation to see if there is anything else that we can offer her for the treatment of her low back pain.  Today we went over her pain and what we have done for her.  And based on the feedback that we have obtained from her, there is really nothing that has provided her with any substantial long-term benefit.  In looking at what we have done for her, my interpretations of the results are that her pain in the lower back seems to be coming from the intraspinal compartment in the region of the thoracolumbar junction.  She does have some prior vertebral fractures including T12, L1, L3, and L4 and at one time doing transforaminal epidural steroid injections in the upper region did provide her the patient with 100% relief of the pain for the duration of  the local anesthetic indicating that  the region is associated with the portion of the spine were her pain is coming from.  However, she attained no long-term benefit indicating that the pain is not being maintained by an inflammatory process but more so a mechanical problem.  Knowing the above, then we have that in order to provide the patient with complete relief of the pain, surgical manipulation of this region may be an option.  However, based on the results of her 12/06/2020 MRI, we have that she has several areas within the spine that could be responsible for the pain that she is experiencing.  In addition, the patient is 86 years old and she is really not interested in undergoing any extensive surgical procedure at her age.  According to the patient's PMP, she currently receives medication containing opioid analgesics from Erby Pian, MD (Hydrocodone-Homatropine Soln) and from Danise Mina, NP (Hydrocodone-Acetamin 5-325 Mg).  Rosanne Gutting) The patient indicates that she will continue getting her medications from them.  Today we have also reminded the patient that the magnesium and calcium that we suggested that she take, can be obtained over-the-counter.  At this point, we will not be scheduling the patient to return to our office just to prescribe calcium and magnesium.  Based on the results obtained from her interventional therapies and the above gathered information, we have decided to hold on any further interventional therapies unless something was to change.  The patient is still able to do her activities of daily living although some of them are becoming harder to do secondary to her spinal issues.  She also refers that she is slowly getting weaker and she may need additional help at home to continue taking care of her dwelling.  At this point, we will have the patient continue getting her medications from [facility] and we will see her on a as needed basis should she decide to  again explore any interventional therapies.  This information was shared with the patient who understood and accepted.  She indicated that she will call us if she needs this.  Pharmacotherapy Assessment  Analgesic: No chronic opioid analgesics therapy prescribed by our practice. Hydrocodone/APAP 7.5/325, 1 tab p.o. 4 times daily (30 mg/day of hydrocodone) (filled on 02/07/2021) (22.5 MME) (prescribed by Danise Mina, ANP.) MME/day: 30 mg/day   Monitoring: [place] PMP: PDMP reviewed during this encounter.       Pharmacotherapy: No side-effects or adverse reactions reported. Compliance: No problems identified. Effectiveness: Clinically acceptable.  Hart Rochester, RN  09/26/2021  1:56 PM  Signed Safety precautions to be maintained throughout the outpatient stay will include: orient to surroundings, keep bed in low position, maintain call bell within reach at all times, provide assistance with transfer out of bed and ambulation.     UDS:  Summary  Date Value Ref Range Status  01/16/2021 Note  Final    Comment:    ==================================================================== Compliance Drug Analysis, Ur ==================================================================== Test                             Result       Flag       Units  Drug Present and Declared for Prescription Verification   Hydrocodone                    1275         EXPECTED   ng/mg creat   Dihydrocodeine  287          EXPECTED   ng/mg creat   Norhydrocodone                 >3448        EXPECTED   ng/mg creat    Sources of hydrocodone include scheduled prescription medications.    Dihydrocodeine and norhydrocodone are expected metabolites of    hydrocodone. Dihydrocodeine is also available as a scheduled    prescription medication.    Citalopram                     PRESENT      EXPECTED   Desmethylcitalopram            PRESENT      EXPECTED    Desmethylcitalopram is an expected  metabolite of citalopram or the    enantiomeric form, escitalopram.    Acetaminophen                  PRESENT      EXPECTED  Drug Present not Declared for Prescription Verification   Salicylate                     PRESENT      UNEXPECTED  Drug Absent but Declared for Prescription Verification   Diclofenac                     Not Detected UNEXPECTED    Diclofenac, as indicated in the declared medication list, is not    always detected even when used as directed.  ==================================================================== Test                      Result    Flag   Units      Ref Range   Creatinine              145              mg/dL      >=20 ==================================================================== Declared Medications:  The flagging and interpretation on this report are based on the  following declared medications.  Unexpected results may arise from  inaccuracies in the declared medications.   **Note: The testing scope of this panel includes these medications:   Citalopram (Celexa)  Hydrocodone (Norco)   **Note: The testing scope of this panel does not include small to  moderate amounts of these reported medications:   Acetaminophen (Norco)  Diclofenac (Voltaren)   **Note: The testing scope of this panel does not include the  following reported medications:   Cholecalciferol  Cyanocobalamin  Denosumab (Prolia)  Helium  Oxygen  Pirfenidone (Esbriet)  Sulfasalazine ==================================================================== For clinical consultation, please call [phone]. ====================================================================      ROS  Constitutional: Denies any fever or chills Gastrointestinal: No reported hemesis, hematochezia, vomiting, or acute GI distress Musculoskeletal: Denies any acute onset joint swelling, redness, loss of ROM, or weakness Neurological: No reported episodes of acute onset apraxia, aphasia,  dysarthria, agnosia, amnesia, paralysis, loss of coordination, or loss of consciousness  Medication Review  HYDROcodone-acetaminophen, Magnesium Oxide, Oxygen-Helium, Pirfenidone, atorvastatin, budesonide, calcium carbonate, citalopram, denosumab, diclofenac Sodium, famotidine, fidaxomicin, metoprolol succinate, mupirocin ointment, nystatin cream, sulfaSALAzine, and vitamin B-12  History Review  Allergy: [name] has No Known Allergies. Drug: [name]  reports no history of drug use. Alcohol:  reports no history of alcohol use. Tobacco:  reports that she has never smoked. She has never  used smokeless tobacco. Social: [name]  reports that she has never smoked. She has never used smokeless tobacco. She reports that she does not drink alcohol and does not use drugs. 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 Surgical: [name]  has a past surgical history that includes Rotator cuff repair (Bilateral); Total shoulder replacement (Left); Cataract extraction w/PHACO (Left, 04/24/2016); Cataract extraction w/PHACO (Right, 07/14/2017); Esophagogastroduodenoscopy (egd) with propofol (N/A, 10/13/2018); Flexible sigmoidoscopy (N/A, 10/13/2018); Cholecystectomy; Tonsillectomy; Back surgery; Flexible sigmoidoscopy (N/A, 09/09/2019); Flexible sigmoidoscopy (N/A, 08/04/2020); Hip  Arthroplasty (Right, 09/27/2020); Cystoscopy/ureteroscopy/holmium laser/stent placement (Left, 04/27/2021); and Flexible sigmoidoscopy (N/A, 08/31/2021). Family: family history includes COPD in her mother; Cancer in her brother.  Laboratory Chemistry Profile   Renal Lab Results  Component Value Date   BUN 24 (H) 04/21/2021   CREATININE 0.73 04/21/2021   BCR 23 09/21/2020   GFR 75.35 10/24/2020   GFRAA 76 09/21/2020   GFRNONAA >60 04/21/2021    Hepatic Lab Results  Component Value Date   AST 36 04/21/2021   ALT 25 04/21/2021   ALBUMIN 3.8 04/21/2021   ALKPHOS 69 04/21/2021   LIPASE 35 04/21/2021    Electrolytes Lab Results  Component Value Date   NA 139 04/21/2021   K 4.4 04/21/2021   CL 106 04/21/2021   CALCIUM 9.2 04/21/2021   MG 2.1 09/29/2020   PHOS 3.3 05/28/2020    Bone Lab Results  Component Value Date   VD25OH 37.79 10/24/2020   25OHVITD1 42 01/03/2021   25OHVITD2 <1.0 01/03/2021   25OHVITD3 42 01/03/2021    Inflammation (CRP: Acute Phase) (ESR: Chronic Phase) Lab Results  Component Value Date   CRP <1 01/03/2021   ESRSEDRATE 13 01/03/2021         Note: Above Lab results reviewed.  Recent Imaging Review  DG PAIN CLINIC C-ARM 1-60 MIN NO REPORT Fluoro was used, but no Radiologist interpretation will be provided.  Please refer to "NOTES" tab for provider progress note. Note: Reviewed        Physical Exam  General appearance: Well nourished, well developed, and well hydrated. In no apparent acute distress Mental status: Alert, oriented x 3 (person, place, & time)       Respiratory: No evidence of acute respiratory distress Eyes: PERLA Vitals: BP 138/73    Pulse (!) 58    Temp (!) 97.1 F (36.2 C) (Temporal)    Resp 18    Ht 5' (1.524 m)    Wt 125 lb (56.7 kg)    BMI 24.41 kg/m  BMI: Estimated body mass index is 24.41 kg/m as calculated from the following:   Height as of this encounter: 5' (1.524 m).   Weight as of this encounter: 125 lb (56.7  kg). Ideal: Ideal body weight: 45.5 kg (100 lb 4.9 oz) Adjusted ideal body weight: 50 kg (110 lb 3 oz)  Assessment   Status Diagnosis  Controlled Controlled Controlled 1. Chronic pain syndrome   2. Chronic low back pain (1ry area of Pain) (Bilateral) (L>R) w/o sciatica   3. Chronic groin pain (2ry  area of Pain) (Right)   4. Chronic shoulder pain (3ry area of Pain) (Bilateral) (L>R)   5. Compression fracture of T12 thoracic vertebra (w/ retropulsion), sequela   6. Compression fracture of L1 lumbar vertebra, sequela   7. Compression fracture of L3 lumbar vertebra, sequela   8. Compression fracture of L4 lumbar vertebra, sequela   9. Chronic upper back pain   10. Chronic thoracic back pain (Left)   11. Other intervertebral disc degeneration, thoracic region   12. Pharmacologic therapy   13. Chronic use of opiate for therapeutic purpose   14. Abnormal MRI, lumbar spine (12/06/2020)      Updated Problems: No problems updated.  Plan of Care  Problem-specific:  No problem-specific Assessment & Plan notes found for this encounter.  [name] has a current medication list which includes the following long-term medication(s): atorvastatin, calcium carbonate, citalopram, metoprolol succinate, and sulfasalazine.  Pharmacotherapy (Medications Ordered): No orders of the defined types were placed in this encounter.  Orders:  No orders of the defined types were placed in this encounter.  Follow-up plan:   Return if symptoms worsen or fail to improve.     Interventional Therapies  Risk   Complexity Considerations:   Estimated body mass index is 24.61 kg/m as calculated from the following:   Height as of this encounter: 5' (1.524 m).   Weight as of this encounter: 126 lb (57.2 kg). WNL   Planned   Pending:   Diagnostic bilateral lumbar facet block #3    Under consideration:   Diagnostic right L2 and L3 transforaminal ESI #1  Possible left suprascapular nerve RFA #1   Diagnostic right IA glenohumeral and AC joint inj. #1    Completed:   Diagnostic bilateral lumbar facet MBB x3 (07/31/2021) (0/0/0/0)  Diagnostic/therapeutic left T12 & L1 TFESI x1 (03/20/2021) (100/100/0/0)  Diagnostic/therapeutic bilateral L3 TFESI x1 (02/13/2021) (50/50/0/0) Diagnostic/therapeutic left L4-5 LESI x1 (02/13/2021) (50/50/0/0)  Diagnostic/therapeutic left suprascapular NB x1 (05/17/2021) (100/50/10/0)   Therapeutic   Palliative (PRN) options:   None established    Recent Visits Date Type Provider Dept  08/16/21 Office Visit Milinda Pointer, MD [facility]  07/31/21 Procedure visit Milinda Pointer, MD [facility]  07/24/21 Office Visit Milinda Pointer, MD [facility]  07/03/21 Office Visit Milinda Pointer, MD [facility]  Showing recent visits within past 90 days and meeting all other requirements Today's Visits Date Type Provider Dept  09/26/21 Office Visit Milinda Pointer, MD [facility]  Showing today's visits and meeting all other requirements Future Appointments No visits were found meeting these conditions. Showing future appointments within next 90 days and meeting all other requirements  I discussed the assessment and treatment plan with the patient. The patient was provided an opportunity to ask questions and all were answered. The patient agreed with the plan and demonstrated an understanding of the instructions.  Patient advised to call back or seek an in-person evaluation if the symptoms or condition worsens.  Duration of encounter: 47 minutes.  Note by: Gaspar Cola, MD Date: 09/26/2021; Time: 2:35 PM

## 2021-09-26 ENCOUNTER — Encounter: Payer: Self-pay | Admitting: Pain Medicine

## 2021-09-26 ENCOUNTER — Ambulatory Visit: Payer: Medicare HMO | Attending: Pain Medicine | Admitting: Pain Medicine

## 2021-09-26 ENCOUNTER — Other Ambulatory Visit: Payer: Self-pay

## 2021-09-26 VITALS — BP 138/73 | HR 58 | Temp 97.1°F | Resp 18 | Ht 60.0 in | Wt 125.0 lb

## 2021-09-26 DIAGNOSIS — M25511 Pain in right shoulder: Secondary | ICD-10-CM | POA: Diagnosis not present

## 2021-09-26 DIAGNOSIS — R937 Abnormal findings on diagnostic imaging of other parts of musculoskeletal system: Secondary | ICD-10-CM | POA: Diagnosis not present

## 2021-09-26 DIAGNOSIS — S32030S Wedge compression fracture of third lumbar vertebra, sequela: Secondary | ICD-10-CM

## 2021-09-26 DIAGNOSIS — M25512 Pain in left shoulder: Secondary | ICD-10-CM | POA: Diagnosis not present

## 2021-09-26 DIAGNOSIS — M545 Low back pain, unspecified: Secondary | ICD-10-CM | POA: Diagnosis not present

## 2021-09-26 DIAGNOSIS — M5134 Other intervertebral disc degeneration, thoracic region: Secondary | ICD-10-CM | POA: Diagnosis not present

## 2021-09-26 DIAGNOSIS — M549 Dorsalgia, unspecified: Secondary | ICD-10-CM | POA: Insufficient documentation

## 2021-09-26 DIAGNOSIS — S32040S Wedge compression fracture of fourth lumbar vertebra, sequela: Secondary | ICD-10-CM

## 2021-09-26 DIAGNOSIS — S32010S Wedge compression fracture of first lumbar vertebra, sequela: Secondary | ICD-10-CM | POA: Diagnosis not present

## 2021-09-26 DIAGNOSIS — R1031 Right lower quadrant pain: Secondary | ICD-10-CM | POA: Diagnosis not present

## 2021-09-26 DIAGNOSIS — G894 Chronic pain syndrome: Secondary | ICD-10-CM

## 2021-09-26 DIAGNOSIS — G8929 Other chronic pain: Secondary | ICD-10-CM

## 2021-09-26 DIAGNOSIS — M546 Pain in thoracic spine: Secondary | ICD-10-CM | POA: Insufficient documentation

## 2021-09-26 DIAGNOSIS — Z79899 Other long term (current) drug therapy: Secondary | ICD-10-CM

## 2021-09-26 DIAGNOSIS — S22080S Wedge compression fracture of T11-T12 vertebra, sequela: Secondary | ICD-10-CM | POA: Diagnosis not present

## 2021-09-26 DIAGNOSIS — Z79891 Long term (current) use of opiate analgesic: Secondary | ICD-10-CM | POA: Diagnosis not present

## 2021-09-26 NOTE — Patient Instructions (Signed)
____________________________________________________________________________________________  Medication Rules  Purpose: To inform patients, and their family members, of our rules and regulations.  Applies to: All patients receiving prescriptions (written or electronic).  Pharmacy of record: Pharmacy where electronic prescriptions will be sent. If written prescriptions are taken to a different pharmacy, please inform the nursing staff. The pharmacy listed in the electronic medical record should be the one where you would like electronic prescriptions to be sent.  Electronic prescriptions: In compliance with the [place] (STOP) Act of 2017 (Session Lanny Cramp [phone]), effective September 02, 2018, all controlled substances must be electronically prescribed. Calling prescriptions to the pharmacy will cease to exist.  Prescription refills: Only during scheduled appointments. Applies to all prescriptions.  NOTE: The following applies primarily to controlled substances (Opioid* Pain Medications).   Type of encounter (visit): For patients receiving controlled substances, face-to-face visits are required. (Not an option or up to the patient.)  Patient's responsibilities: Pain Pills: Bring all pain pills to every appointment (except for procedure appointments). Pill Bottles: Bring pills in original pharmacy bottle. Always bring the newest bottle. Bring bottle, even if empty. Medication refills: You are responsible for knowing and keeping track of what medications you take and those you need refilled. The day before your appointment: write a list of all prescriptions that need to be refilled. The day of the appointment: give the list to the admitting nurse. Prescriptions will be written only during appointments. No prescriptions will be written on procedure days. If you forget a medication: it will not be "Called in", "Faxed", or "electronically sent". You will  need to get another appointment to get these prescribed. No early refills. Do not call asking to have your prescription filled early. Prescription Accuracy: You are responsible for carefully inspecting your prescriptions before leaving our office. Have the discharge nurse carefully go over each prescription with you, before taking them home. Make sure that your name is accurately spelled, that your address is correct. Check the name and dose of your medication to make sure it is accurate. Check the number of pills, and the written instructions to make sure they are clear and accurate. Make sure that you are given enough medication to last until your next medication refill appointment. Taking Medication: Take medication as prescribed. When it comes to controlled substances, taking less pills or less frequently than prescribed is permitted and encouraged. Never take more pills than instructed. Never take medication more frequently than prescribed.  Inform other Doctors: Always inform, all of your healthcare providers, of all the medications you take. Pain Medication from other Providers: You are not allowed to accept any additional pain medication from any other Doctor or Healthcare provider. There are two exceptions to this rule. (see below) In the event that you require additional pain medication, you are responsible for notifying us, as stated below. Cough Medicine: Often these contain an opioid, such as codeine or hydrocodone. Never accept or take cough medicine containing these opioids if you are already taking an opioid* medication. The combination may cause respiratory failure and death. Medication Agreement: You are responsible for carefully reading and following our Medication Agreement. This must be signed before receiving any prescriptions from our practice. Safely store a copy of your signed Agreement. Violations to the Agreement will result in no further prescriptions. (Additional copies of our  Medication Agreement are available upon request.) Laws, Rules, & Regulations: All patients are expected to follow all Federal and [employer], [employer], Rules, [employer] [facility]. Ignorance of  the Laws does not constitute a valid excuse.  Illegal drugs and Controlled Substances: The use of illegal substances (including, but not limited to marijuana and its derivatives) and/or the illegal use of any controlled substances is strictly prohibited. Violation of this rule may result in the immediate and permanent discontinuation of any and all prescriptions being written by our practice. The use of any illegal substances is prohibited. Adopted CDC guidelines & recommendations: Target dosing levels will be at or below 60 MME/day. Use of benzodiazepines** is not recommended.  Exceptions: There are only two exceptions to the rule of not receiving pain medications from other Healthcare Providers. Exception #1 (Emergencies): In the event of an emergency (i.e.: accident requiring emergency care), you are allowed to receive additional pain medication. However, you are responsible for: As soon as you are able, call our office [phone], at any time of the day or night, and leave a message stating your name, the date and nature of the emergency, and the name and dose of the medication prescribed. In the event that your call is answered by a member of our staff, make sure to document and save the date, time, and the name of the person that took your information.  Exception #2 (Planned Surgery): In the event that you are scheduled by another doctor or dentist to have any type of surgery or procedure, you are allowed (for a period no longer than 30 days), to receive additional pain medication, for the acute post-op pain. However, in this case, you are responsible for picking up a copy of our "Post-op Pain Management for Surgeons" handout, and giving it to your surgeon or dentist. This document is available at our office, and  does not require an appointment to obtain it. Simply go to our office during business hours (Monday-Thursday from 8:00 AM to 4:00 PM) (Friday 8:00 AM to 12:00 Noon) or if you have a scheduled appointment with [place], prior to your surgery, and ask for it by name. In addition, you are responsible for: calling our office [phone], at any time of the day or night, and leaving a message stating your name, name of your surgeon, type of surgery, and date of procedure or surgery. Failure to comply with your responsibilities may result in termination of therapy involving the controlled substances. Medication Agreement Violation. Following the above rules, including your responsibilities will help you in avoiding a Medication Agreement Violation (Breaking your Pain Medication Contract).  *Opioid medications include: morphine, codeine, oxycodone, oxymorphone, hydrocodone, hydromorphone, meperidine, tramadol, tapentadol, buprenorphine, fentanyl, methadone. **Benzodiazepine medications include: diazepam (Valium), alprazolam (Xanax), clonazepam (Klonopine), lorazepam (Ativan), clorazepate (Tranxene), chlordiazepoxide (Librium), estazolam (Prosom), oxazepam (Serax), temazepam (Restoril), triazolam (Halcion) (Last updated: 05/30/2021) ____________________________________________________________________________________________  ____________________________________________________________________________________________  Medication Recommendations and Reminders  Applies to: All patients receiving prescriptions (written and/or electronic).  Medication Rules & Regulations: These rules and regulations exist for your safety and that of others. They are not flexible and neither are we. Dismissing or ignoring them will be considered "non-compliance" with medication therapy, resulting in complete and irreversible termination of such therapy. (See document titled "Medication Rules" for more details.) In all conscience,  because of safety reasons, we cannot continue providing a therapy where the patient does not follow instructions.  Pharmacy of record:  Definition: This is the pharmacy where your electronic prescriptions will be sent.  We do not endorse any particular pharmacy, however, we have experienced problems with [facility] not securing enough medication supply for the community. We do not restrict you  in your choice of pharmacy. However, once we write for your prescriptions, we will NOT be re-sending more prescriptions to fix restricted supply problems created by your pharmacy, or your insurance.  The pharmacy listed in the electronic medical record should be the one where you want electronic prescriptions to be sent. If you choose to change pharmacy, simply notify our nursing staff.  Recommendations: Keep all of your pain medications in a safe place, under lock and key, even if you live alone. We will NOT replace lost, stolen, or damaged medication. After you fill your prescription, take 1 week's worth of pills and put them away in a safe place. You should keep a separate, properly labeled bottle for this purpose. The remainder should be kept in the original bottle. Use this as your primary supply, until it runs out. Once it's gone, then you know that you have 1 week's worth of medicine, and it is time to come in for a prescription refill. If you do this correctly, it is unlikely that you will ever run out of medicine. To make sure that the above recommendation works, it is very important that you make sure your medication refill appointments are scheduled at least 1 week before you run out of medicine. To do this in an effective manner, make sure that you do not leave the office without scheduling your next medication management appointment. Always ask the nursing staff to show you in your prescription , when your medication will be running out. Then arrange for the receptionist to get you a return appointment,  at least 7 days before you run out of medicine. Do not wait until you have 1 or 2 pills left, to come in. This is very poor planning and does not take into consideration that we may need to cancel appointments due to bad weather, sickness, or emergencies affecting our staff. DO NOT ACCEPT A "Partial Fill": If for any reason your pharmacy does not have enough pills/tablets to completely fill or refill your prescription, do not allow for a "partial fill". The law allows the pharmacy to complete that prescription within 72 hours, without requiring a new prescription. If they do not fill the rest of your prescription within those 72 hours, you will need a separate prescription to fill the remaining amount, which we will NOT provide. If the reason for the partial fill is your insurance, you will need to talk to the pharmacist about payment alternatives for the remaining tablets, but again, DO NOT ACCEPT A PARTIAL FILL, unless you can trust your pharmacist to obtain the remainder of the pills within 72 hours.  Prescription refills and/or changes in medication(s):  Prescription refills, and/or changes in dose or medication, will be conducted only during scheduled medication management appointments. (Applies to both, written and electronic prescriptions.) No refills on procedure days. No medication will be changed or started on procedure days. No changes, adjustments, and/or refills will be conducted on a procedure day. Doing so will interfere with the diagnostic portion of the procedure. No phone refills. No medications will be "called into the pharmacy". No Fax refills. No weekend refills. No Holliday refills. No after hours refills.  Remember:  Business hours are:  Monday to Thursday 8:00 AM to 4:00 PM Provider's Schedule: Milinda Pointer, MD - Appointments are:  Medication management: Monday and Wednesday 8:00 AM to 4:00 PM Procedure day: Tuesday and Thursday 7:30 AM to 4:00 PM Gillis Santa, MD -  Appointments are:  Medication management: Tuesday and Thursday 8:00  AM to 4:00 PM Procedure day: Monday and Wednesday 7:30 AM to 4:00 PM (Last update: 03/22/2020) ____________________________________________________________________________________________  ____________________________________________________________________________________________  CBD (cannabidiol) & Delta-8 (Delta-8 tetrahydrocannabinol) WARNING  Intro: Cannabidiol (CBD) and tetrahydrocannabinol (THC), are two natural compounds found in plants of the Cannabis genus. They can both be extracted from hemp or cannabis. Hemp and cannabis come from the Cannabis sativa plant. Both compounds interact with your bodys endocannabinoid system, but they have very different effects. CBD does not produce the high sensation associated with cannabis. Delta-8 tetrahydrocannabinol, also known as delta-8 THC, is a psychoactive substance found in the Cannabis sativa plant, of which marijuana and hemp are two varieties. THC is responsible for the high associated with the illicit use of marijuana.  Applicable to: All individuals currently taking or considering taking CBD (cannabidiol) and, more important, all patients taking opioid analgesic controlled substances (pain medication). (Example: oxycodone; oxymorphone; hydrocodone; hydromorphone; morphine; methadone; tramadol; tapentadol; fentanyl; buprenorphine; butorphanol; dextromethorphan; meperidine; codeine; etc.)  Legal status: CBD remains a Schedule I drug prohibited for any use. CBD is illegal with one exception. In the [place], CBD has a limited Transport planner (FDA) approval for the treatment of two specific types of epilepsy disorders. Only one CBD product has been approved by the FDA for this purpose: "Epidiolex". FDA is aware that some companies are marketing products containing cannabis and cannabis-derived compounds in ways that violate the [employer], Drug and Cosmetic Act  [facility] Act) and that may put the health and safety of consumers at risk. The FDA, a Federal agency, has not enforced the CBD status since 2018.   Legality: Some manufacturers ship CBD products nationally, which is illegal. Often such products are sold online and are therefore available throughout the country. CBD is openly sold in head shops and health food stores in some states where such sales have not been explicitly legalized. Selling unapproved products with unsubstantiated therapeutic claims is not only a violation of the law, but also can put patients at risk, as these products have not been proven to be safe or effective. Federal illegality makes it difficult to conduct research on CBD.  Reference: "FDA Regulation of Cannabis and Cannabis-Derived Products, Including Cannabidiol (CBD)" - [employer]  Warning: CBD is not FDA approved and has not undergo the same manufacturing controls as prescription drugs.  This means that the purity and safety of available CBD may be questionable. Most of the time, despite manufacturer's claims, it is contaminated with THC (delta-9-tetrahydrocannabinol - the chemical in marijuana responsible for the "HIGH").  When this is the case, the [facility] contaminant will trigger a positive urine drug screen (UDS) test for Marijuana (carboxy-THC). Because a positive UDS for any illicit substance is a violation of our medication agreement, your opioid analgesics (pain medicine) may be permanently discontinued. The FDA recently put out a warning about 5 things that everyone should be aware of regarding Delta-8 THC: Delta-8 THC products have not been evaluated or approved by the FDA for safe use and may be marketed in ways that put the public health at risk. The FDA has received adverse event reports involving delta-8 THC-containing products. Delta-8 THC has  psychoactive and intoxicating effects. Delta-8 THC manufacturing often involve use of potentially harmful chemicals to create the concentrations of delta-8 THC claimed in the marketplace. The final delta-8 THC product may have potentially harmful by-products (contaminants) due to the chemicals used in the process. Manufacturing of delta-8 THC products may occur in uncontrolled or unsanitary settings, which may  lead to the presence of unsafe contaminants or other potentially harmful substances. Delta-8 THC products should be kept out of the reach of children and pets.  MORE ABOUT CBD  General Information: CBD was discovered in 92 and it is a derivative of the cannabis sativa genus plants (Marijuana and Hemp). It is one of the 113 identified substances found in Marijuana. It accounts for up to 40% of the plant's extract. As of 2018, preliminary clinical studies on CBD included research for the treatment of anxiety, movement disorders, and pain. CBD is available and consumed in multiple forms, including inhalation of smoke or vapor, as an aerosol spray, and by mouth. It may be supplied as an oil containing CBD, capsules, dried cannabis, or as a liquid solution. CBD is thought not to be as psychoactive as THC (delta-9-tetrahydrocannabinol - the chemical in marijuana responsible for the "HIGH"). Studies suggest that CBD may interact with different biological target receptors in the body, including cannabinoid and other neurotransmitter receptors. As of 2018 the mechanism of action for its biological effects has not been determined.  Side-effects   Adverse reactions: Dry mouth, diarrhea, decreased appetite, fatigue, drowsiness, malaise, weakness, sleep disturbances, and others.  Drug interactions: CBC may interact with other medications such as blood-thinners. (Last update: 06/01/2021) ____________________________________________________________________________________________   ____________________________________________________________________________________________  Drug Holidays (Slow)  What is a "Drug Holiday"? Drug Holiday: is the name given to the period of time during which a patient stops taking a medication(s) for the purpose of eliminating tolerance to the drug.  Benefits Improved effectiveness of opioids. Decreased opioid dose needed to achieve benefits. Improved pain with lesser dose.  What is tolerance? Tolerance: is the progressive decreased in effectiveness of a drug due to its repetitive use. With repetitive use, the body gets use to the medication and as a consequence, it loses its effectiveness. This is a common problem seen with opioid pain medications. As a result, a larger dose of the drug is needed to achieve the same effect that used to be obtained with a smaller dose.  How long should a "Drug Holiday" last? You should stay off of the pain medicine for at least 14 consecutive days. (2 weeks)  Should I stop the medicine "cold [place]"? No. You should always coordinate with your Pain Specialist so that he/she can provide you with the correct medication dose to make the transition as smoothly as possible.  How do I stop the medicine? Slowly. You will be instructed to decrease the daily amount of pills that you take by one (1) pill every seven (7) days. This is called a "slow downward taper" of your dose. For example: if you normally take four (4) pills per day, you will be asked to drop this dose to three (3) pills per day for seven (7) days, then to two (2) pills per day for seven (7) days, then to one (1) per day for seven (7) days, and at the end of those last seven (7) days, this is when the "Drug Holiday" would start.   Will I have withdrawals? By doing a "slow downward taper" like this one, it is unlikely that you will experience any significant withdrawal symptoms. Typically, what triggers withdrawals is the sudden stop of a high dose  opioid therapy. Withdrawals can usually be avoided by slowly decreasing the dose over a prolonged period of time. If you do not follow these instructions and decide to stop your medication abruptly, withdrawals may be possible.  What are withdrawals? Withdrawals: refers  to the wide range of symptoms that occur after stopping or dramatically reducing opiate drugs after heavy and prolonged use. Withdrawal symptoms do not occur to patients that use low dose opioids, or those who take the medication sporadically. Contrary to benzodiazepine (example: Valium, Xanax, etc.) or alcohol withdrawals (Delirium Tremens), opioid withdrawals are not lethal. Withdrawals are the physical manifestation of the body getting rid of the excess receptors.  Expected Symptoms Early symptoms of withdrawal may include: Agitation Anxiety Muscle aches Increased tearing Insomnia Runny nose Sweating Yawning  Late symptoms of withdrawal may include: Abdominal cramping Diarrhea Dilated pupils Goose bumps Nausea Vomiting  Will I experience withdrawals? Due to the slow nature of the taper, it is very unlikely that you will experience any.  What is a slow taper? Taper: refers to the gradual decrease in dose.  (Last update: 03/22/2020) ____________________________________________________________________________________________

## 2021-09-26 NOTE — Progress Notes (Signed)
Safety precautions to be maintained throughout the outpatient stay will include: orient to surroundings, keep bed in low position, maintain call bell within reach at all times, provide assistance with transfer out of bed and ambulation.  

## 2021-10-02 ENCOUNTER — Telehealth: Payer: Self-pay

## 2021-10-02 ENCOUNTER — Ambulatory Visit: Payer: Medicare Other | Admitting: Gastroenterology

## 2021-10-02 NOTE — Telephone Encounter (Signed)
[place] [facility] left a voicemail wanting to know If a PA has been done on patient medication.  CB [phone]

## 2021-10-04 ENCOUNTER — Other Ambulatory Visit: Payer: Self-pay

## 2021-10-04 ENCOUNTER — Ambulatory Visit: Payer: Medicare HMO | Admitting: Obstetrics and Gynecology

## 2021-10-04 ENCOUNTER — Encounter: Payer: Self-pay | Admitting: Obstetrics and Gynecology

## 2021-10-04 VITALS — BP 147/60 | HR 89 | Resp 16 | Ht 60.0 in | Wt 126.0 lb

## 2021-10-04 DIAGNOSIS — B372 Candidiasis of skin and nail: Secondary | ICD-10-CM | POA: Diagnosis not present

## 2021-10-04 MED ORDER — FLUCONAZOLE 100 MG PO TABS: ORAL_TABLET | ORAL | 0 refills | Status: DC

## 2021-10-04 MED ORDER — NYSTATIN-TRIAMCINOLONE 100000-0.1 UNIT/GM-% EX OINT: 1.0000 "application " | TOPICAL_OINTMENT | Freq: Two times a day (BID) | CUTANEOUS | 0 refills | Status: DC

## 2021-10-04 NOTE — Progress Notes (Signed)
° ° °  GYNECOLOGY PROGRESS NOTE  Subjective:    Patient ID: [name], female    DOB: [birth year], 86 y.o.   MRN: [id number]  HPI  Patient is a 86 y.o. G2P2 female who presents for 2 week follow up of yeast dermatitis in pannus region. Notes that she has been using the cream (Nystatin) as prescribed, however has not noticed any improvement, and thinks the rash may be getting worse. Is noting a cheesy material beneath her pannus with an odor.   The following portions of the patient's history were reviewed and updated as appropriate: allergies, current medications, past family history, past medical history, past social history, past surgical history, and problem list.  Review of Systems Pertinent items noted in HPI and remainder of comprehensive ROS otherwise negative.   Objective:   Blood pressure (!) 147/60, pulse 89, resp. rate 16, height 5' (1.524 m), weight 126 lb (57.2 kg).  Body mass index is 24.61 kg/m. General appearance: alert and no distress Skin: moderate erythema, blistering present in pannus region, mildly tender.    Assessment:   1. Yeast dermatitis      Plan:   - Will change prescription cream to Mycolog.  Also will prescribe oral diflucan regimen to help with treatment of rash.  RTC in 2-3 weeks if no improvement.    Rubie Maid, MD [facility]

## 2021-10-08 ENCOUNTER — Telehealth: Payer: Self-pay

## 2021-10-08 NOTE — Telephone Encounter (Signed)
Patient called in and only wanted to speak with dranna nurse

## 2021-10-09 NOTE — Telephone Encounter (Signed)
Patient called and wants a call back As soon as you can

## 2021-10-10 ENCOUNTER — Encounter: Payer: Self-pay | Admitting: Gastroenterology

## 2021-10-16 ENCOUNTER — Encounter: Payer: Self-pay | Admitting: Primary Care

## 2021-10-16 ENCOUNTER — Ambulatory Visit (INDEPENDENT_AMBULATORY_CARE_PROVIDER_SITE_OTHER)
Admission: RE | Admit: 2021-10-16 | Discharge: 2021-10-16 | Disposition: A | Discharge status: Home / Self Care | Payer: Medicare HMO | Source: Ambulatory Visit | Attending: Primary Care | Admitting: Primary Care

## 2021-10-16 ENCOUNTER — Ambulatory Visit (INDEPENDENT_AMBULATORY_CARE_PROVIDER_SITE_OTHER): Payer: Medicare HMO | Admitting: Primary Care

## 2021-10-16 ENCOUNTER — Other Ambulatory Visit: Payer: Self-pay

## 2021-10-16 VITALS — BP 158/82 | Temp 98.3°F | Ht 60.0 in | Wt 123.0 lb

## 2021-10-16 DIAGNOSIS — E559 Vitamin D deficiency, unspecified: Secondary | ICD-10-CM | POA: Diagnosis not present

## 2021-10-16 DIAGNOSIS — E538 Deficiency of other specified B group vitamins: Secondary | ICD-10-CM

## 2021-10-16 DIAGNOSIS — J84112 Idiopathic pulmonary fibrosis: Secondary | ICD-10-CM

## 2021-10-16 DIAGNOSIS — M1990 Unspecified osteoarthritis, unspecified site: Secondary | ICD-10-CM

## 2021-10-16 DIAGNOSIS — R918 Other nonspecific abnormal finding of lung field: Secondary | ICD-10-CM | POA: Diagnosis not present

## 2021-10-16 DIAGNOSIS — R053 Chronic cough: Secondary | ICD-10-CM | POA: Diagnosis not present

## 2021-10-16 DIAGNOSIS — J8410 Pulmonary fibrosis, unspecified: Secondary | ICD-10-CM | POA: Diagnosis not present

## 2021-10-16 LAB — VITAMIN B12: Vitamin B-12: 863 pg/mL (ref 211–911)

## 2021-10-16 LAB — VITAMIN D 25 HYDROXY (VIT D DEFICIENCY, FRACTURES): VITD: 61.33 ng/mL (ref 30.00–100.00)

## 2021-10-16 MED ORDER — BENZONATATE 200 MG PO CAPS: 200.0000 mg | ORAL_CAPSULE | Freq: Three times a day (TID) | ORAL | 0 refills | Status: DC | PRN

## 2021-10-16 NOTE — Patient Instructions (Signed)
Stop by the lab and xray prior to leaving today. I will notify you of your results once received.   You may take Benzonatate capsules for cough. Take 1 capsule by mouth three times daily as needed for cough.  Call for your referral to [facility] in a few days.  It was a pleasure to see you today!

## 2021-10-16 NOTE — Progress Notes (Signed)
Subjective:    Patient ID: [name], female    DOB: March 29, 1936, 86 y.o.   MRN: [id number]  HPI  [name] is a very pleasant 86 y.o. female with a history of IPF, chronic back pain, compression fractions to thoracic and lumbar spine, hyperlipidemia, vitamin D deficiency, anxiety who presents today to discuss cough.  Follows with pulmonology for Idiopathic pulmonary fibrosis, managed on Esbriet 267 mg TID. Also managed on supplemental oxygen at night, has been using during the day some. She was last evaluated by her pulmonologist in person in September 2022, endorsed coughing "a lot". She was initiated on Pulmicort 1 puf BID, and told to start gabapentin 100 mg BID, and OTC cough syrup.   She called her pulmonologist's office 2 weeks ago, was told to continue OTC cough syrup and to start gabapentin 100 mg every 12 hours.  Today she continues to experience a persistently dry and wet cough, progressing. She's taking gabapentin 100 mg twice daily without improvement in cough, causes drowsiness. "I'm sick all the time, no one knows why". She questions if her cough is secondary to "scarring in the lungs". She is requesting a prescription for [employer]. She is now taking Hydrocodone syrup per her pulmonologist.   She is very sedentary during the day due to her chronic back pain. She hardly leaves her home, cannot go shopping or to the grocery store. Her daughter is helping her around her home and is requesting a referral to [facility] to a IPF specialist.   She is requesting repeat vitamin D and B12 levels, also magnesium. She is compliant to both supplements OTC.   She is managed on magnesium oxide 500 mg BID per pain management since September 2022. She found this medication to help with leg strength. She ran out of this months ago and is requesting refills today.   BP Readings from Last 3 Encounters:  10/16/21 (!) 158/82  10/04/21 (!) 147/60  09/26/21 138/73       Review of Systems  Constitutional:  Positive for fatigue.  Respiratory:  Positive for cough and shortness of breath.   Musculoskeletal:  Positive for arthralgias and back pain.  Neurological:  Negative for dizziness.        Past Medical History:  Diagnosis Date   Altered mental state    Anxiety    Basal cell carcinoma 05/03/2021   right neck postauricular - Excised 06/12/21   BMI 30.0-30.9,adult    Cellulitis    Chest pain, atypical    Compression fracture of L4 lumbar vertebra    Constipation    Cystitis    Cystocele    Diverticulosis    Fatigue    Fibrosis, idiopathic pulmonary (HCC)    Gastroenteritis    History of back surgery    History of herniated intervertebral disc    HTN (hypertension)    Hypercholesteremia    Hypocalcemia    Incontinence of urine    OA (osteoarthritis)    shoulder and back   Obesity    Osteopenia    Pneumonia    Shortness of breath    Sleep apnea    Squamous cell carcinoma of skin 07/16/2016   Right upper lateral eyebrow. SCCis.   Squamous cell carcinoma of skin 01/13/2018   Left temporal hairline. WD SCC with superficial infiltration.   Thrush    Vitamin D deficiency     Social History   Socioeconomic History   Marital status: Married  Spouse name: [name]   Number of children: 2   Years of education: Not on file   Highest education level: Not on file  Occupational History   Not on file  Tobacco Use   Smoking status: Never   Smokeless tobacco: Never  Vaping Use   Vaping Use: Never used  Substance and Sexual Activity   Alcohol use: No   Drug use: No   Sexual activity: Never    Birth control/protection: None  Other Topics Concern   Not on file  Social History Narrative   Not on file   Social Determinants of Health   Financial Resource Strain: Low Risk    Difficulty of Paying Living Expenses: Not hard at all  Food Insecurity: No Food Insecurity   Worried About Charity fundraiser in the Last Year: Never  true   [place] in the Last Year: Never true  Transportation Needs: No Transportation Needs   Lack of Transportation (Medical): No   Lack of Transportation (Non-Medical): No  Physical Activity: Inactive   Days of Exercise per Week: 0 days   Minutes of Exercise per Session: 0 min  Stress: Stress Concern Present   Feeling of Stress : To some extent  Social Connections: Not on file  Intimate Partner Violence: Not At Risk   Fear of Current or Ex-Partner: No   Emotionally Abused: No   Physically Abused: No   Sexually Abused: No    Past Surgical History:  Procedure Laterality Date   BACK SURGERY     CATARACT EXTRACTION W/PHACO Left 04/24/2016   Procedure: CATARACT EXTRACTION PHACO AND INTRAOCULAR LENS PLACEMENT (IOC);  Surgeon: Leandrew Koyanagi, MD;  Location: [place];  Service: Ophthalmology;  Laterality: Left;   CATARACT EXTRACTION W/PHACO Right 07/14/2017   Procedure: CATARACT EXTRACTION PHACO AND INTRAOCULAR LENS PLACEMENT ([place])  RIGHT;  Surgeon: Leandrew Koyanagi, MD;  Location: [place];  Service: Ophthalmology;  Laterality: Right;   CHOLECYSTECTOMY     CYSTOSCOPY/URETEROSCOPY/HOLMIUM LASER/STENT PLACEMENT Left 04/27/2021   Procedure: CYSTOSCOPY/URETEROSCOPY/HOLMIUM LASER/STENT PLACEMENT;  Surgeon: Billey Co, MD;  Location: [facility];  Service: Urology;  Laterality: Left;   ESOPHAGOGASTRODUODENOSCOPY (EGD) WITH PROPOFOL N/A 10/13/2018   Procedure: ESOPHAGOGASTRODUODENOSCOPY (EGD) WITH PROPOFOL;  Surgeon: [name], MD;  Location: [facility] ENDOSCOPY;  Service: Gastroenterology;  Laterality: N/A;   FLEXIBLE SIGMOIDOSCOPY N/A 10/13/2018   Procedure: FLEXIBLE SIGMOIDOSCOPY;  Surgeon: [name], MD;  Location: [facility] END[facility]COPY;  Service: Gastroenterology;  Laterality: N/A;   FLEXIBLE SIGMOIDOSCOPY N/A 09/09/2019   Procedure: FLEXIBLE SIGMOIDOSCOPY;  Surgeon: [name], MD;  Location: [facility] ENDOSCOPY;  Service: Gastroenterology;  Laterality: N/A;    FLEXIBLE SIGMOIDOSCOPY N/A 08/04/2020   Procedure: FLEXIBLE SIGMOIDOSCOPY;  Surgeon: [name], MD;  Location: [facility] ENDOSCOPY;  Service: Gastroenterology;  Laterality: N/A;  Per Dr. Vicente Males, unsedated   [place] N/A 08/31/2021   Procedure: FLEXIBLE SIGMOIDOSCOPY;  Surgeon: [name], MD;  Location: [facility] ENDOSCOPY;  Service: Gastroenterology;  Laterality: N/A;  no sedation   HIP ARTHROPLASTY Right 09/27/2020   Procedure: ARTHROPLASTY BIPOLAR HIP (HEMIARTHROPLASTY);  Surgeon: Hessie Knows, MD;  Location: [facility];  Service: Orthopedics;  Laterality: Right;   ROTATOR CUFF REPAIR Bilateral    left-12/2009; right-03/2009 dr.califf   TONSILLECTOMY     TOTAL SHOULDER REPLACEMENT Left     Family History  Problem Relation Age of Onset   COPD Mother    Cancer Brother        colon    No Known Allergies  Current  Outpatient Medications on File Prior to Visit  Medication Sig Dispense Refill   atorvastatin (LIPITOR) 20 MG tablet Take 1 tablet (20 mg total) by mouth daily. For cholesterol. 90 tablet 3   budesonide (PULMICORT) 180 MCG/ACT inhaler Inhale 2 puffs into the lungs 2 (two) times daily.     denosumab (PROLIA) 60 MG/ML SOSY injection Inject 60 mg into the skin every 6 (six) months.     ESBRIET 267 MG TABS Take 267 mg by mouth 3 (three) times daily with meals.     famotidine (PEPCID) 20 MG tablet SMARTSIG:1 Tablet(s) By Mouth Every Evening     fidaxomicin (DIFICID) 200 MG TABS tablet Take 200 mg by mouth 2 (two) times daily.     fluconazole (DIFLUCAN) 100 MG tablet Take 2 tablets on Day#1, then decrease to 1 tablet daily. 15 tablet 0   HYDROcodone-acetaminophen (NORCO/VICODIN) 5-325 MG tablet Take 1 tablet by mouth 2 (two) times daily as needed.     Magnesium Oxide 500 MG CAPS Take 500 mg by mouth 2 (two) times daily.     metoprolol succinate (TOPROL-XL) 25 MG 24 hr tablet Take 25 mg by mouth every other day.     mupirocin ointment (BACTROBAN) 2 % Apply 1 application topically  daily. With dressing changes 22 g 0   nystatin-triamcinolone ointment (MYCOLOG) Apply 1 application topically 2 (two) times daily. 30 g 0   OXYGEN Inhale 2 L into the lungs at bedtime.     sulfaSALAzine (AZULFIDINE) 500 MG tablet Take 1 tablet (500 mg total) by mouth 2 (two) times daily. 180 tablet 3   vitamin B-12 (CYANOCOBALAMIN) 1000 MCG tablet Take 1,000 mcg by mouth every other day.     VOLTAREN 1 % GEL Apply 2 g topically in the morning, at noon, and at bedtime. Shoulders and back  2   nystatin cream (MYCOSTATIN) Apply 1 application topically 2 (two) times daily. (Patient not taking: Reported on 10/16/2021) 30 g 1   No current facility-administered medications on file prior to visit.    BP (!) 158/82    Temp 98.3 F (36.8 C) (Temporal)    Ht 5' (1.524 m)    Wt 123 lb (55.8 kg)    BMI 24.02 kg/m  Objective:   Physical Exam Constitutional:      General: She is not in acute distress.    Appearance: She is not ill-appearing.  Cardiovascular:     Rate and Rhythm: Normal rate and regular rhythm.  Pulmonary:     Effort: Pulmonary effort is normal.     Breath sounds: Wheezing and rhonchi present.  Musculoskeletal:     Cervical back: Neck supple.  Skin:    General: Skin is warm and dry.  Psychiatric:     Comments: Appears anxious           Assessment & Plan:      This visit occurred during the SARS-CoV-2 public health emergency.  Safety protocols were in place, including screening questions prior to the visit, additional usage of staff PPE, and extensive cleaning of exam room while observing appropriate contact time as indicated for disinfecting solutions.

## 2021-10-16 NOTE — Assessment & Plan Note (Signed)
Continue vitamin B12 1000 mcg daily. Repeat vitamin B12 pending.

## 2021-10-16 NOTE — Assessment & Plan Note (Signed)
Deteriorating.  Reviewed office notes from pulmonology through [facility].   Referral placed to [facility] IPF. Continue Esbriet 267 TID. Continue home oxygen.

## 2021-10-16 NOTE — Assessment & Plan Note (Addendum)
Chronic, uncontrolled.  Follows with orthopedics for Hydrocodone.  Continue hydrocodone-acetaminophen 5-325 mg TID.  Agree to take over magnesium oxide 500 mg BID, will check magnesium levels first.

## 2021-10-16 NOTE — Assessment & Plan Note (Signed)
Compliant to vitamin D pills OTC. Repeat vitamin D level pending.

## 2021-10-16 NOTE — Assessment & Plan Note (Addendum)
Dry cough noted on exam today. Given progressing and persistent cough, will update chest xray.  Reviewed xray from [facility] from September 2022.  Agree to provide Rx for Tessalon perles per patient request. Rx for Tessalon Perles 200 mg TID PRN sent to pharmacy.

## 2021-10-17 DIAGNOSIS — J449 Chronic obstructive pulmonary disease, unspecified: Secondary | ICD-10-CM | POA: Diagnosis not present

## 2021-10-17 LAB — MAGNESIUM: Magnesium: 2 mg/dL (ref 1.5–2.5)

## 2021-10-17 NOTE — Telephone Encounter (Signed)
Patient was reached out and she has her Zinplava scheduled to be given by same day surgery.

## 2021-10-18 ENCOUNTER — Other Ambulatory Visit: Payer: Self-pay | Admitting: Primary Care

## 2021-10-18 ENCOUNTER — Ambulatory Visit: Payer: Medicare HMO

## 2021-10-18 ENCOUNTER — Telehealth: Payer: Self-pay

## 2021-10-18 DIAGNOSIS — G8929 Other chronic pain: Secondary | ICD-10-CM

## 2021-10-18 DIAGNOSIS — R053 Chronic cough: Secondary | ICD-10-CM

## 2021-10-18 DIAGNOSIS — J84112 Idiopathic pulmonary fibrosis: Secondary | ICD-10-CM

## 2021-10-18 MED ORDER — MAGNESIUM OXIDE -MG SUPPLEMENT 500 MG PO CAPS: 500.0000 mg | ORAL_CAPSULE | Freq: Two times a day (BID) | ORAL | 0 refills | Status: DC

## 2021-10-18 NOTE — Telephone Encounter (Signed)
Helene Kelp from [facility] called stating that she had some questions before being able to deliver the stool. I am sending you this message because she stated that the form that was filled out is missing information. Can you please give her a call to know what they are missing. Thank you. [phone]

## 2021-10-19 ENCOUNTER — Ambulatory Visit
Admission: RE | Admit: 2021-10-19 | Discharge: 2021-10-19 | Disposition: A | Discharge status: Home / Self Care | Payer: Medicare HMO | Source: Ambulatory Visit | Attending: Primary Care | Admitting: Primary Care

## 2021-10-19 ENCOUNTER — Other Ambulatory Visit: Payer: Self-pay

## 2021-10-19 ENCOUNTER — Telehealth: Payer: Self-pay | Admitting: Primary Care

## 2021-10-19 DIAGNOSIS — I700 Atherosclerosis of aorta: Secondary | ICD-10-CM | POA: Diagnosis not present

## 2021-10-19 DIAGNOSIS — J189 Pneumonia, unspecified organism: Secondary | ICD-10-CM | POA: Diagnosis not present

## 2021-10-19 DIAGNOSIS — R053 Chronic cough: Secondary | ICD-10-CM | POA: Insufficient documentation

## 2021-10-19 DIAGNOSIS — J84112 Idiopathic pulmonary fibrosis: Secondary | ICD-10-CM | POA: Insufficient documentation

## 2021-10-19 DIAGNOSIS — J849 Interstitial pulmonary disease, unspecified: Secondary | ICD-10-CM | POA: Diagnosis not present

## 2021-10-19 DIAGNOSIS — J479 Bronchiectasis, uncomplicated: Secondary | ICD-10-CM | POA: Diagnosis not present

## 2021-10-19 LAB — POCT I-STAT CREATININE: Creatinine, Ser: 0.8 mg/dL (ref 0.44–1.00)

## 2021-10-19 MED ORDER — IOHEXOL 300 MG/ML  SOLN
75.0000 mL | Freq: Once | INTRAMUSCULAR | Status: AC | PRN
  Administered 2021-10-19: 75 mL via INTRAVENOUS

## 2021-10-19 NOTE — Telephone Encounter (Signed)
[facility] called in and stated that they didn't receive the referral and wanted to know if it can be refaxed fax number [phone]

## 2021-10-19 NOTE — Telephone Encounter (Signed)
Referral was sent to [facility] [address] [place], [place] [id number] Office: [phone]

## 2021-10-19 NOTE — Telephone Encounter (Signed)
Ginger was able to call [facility] and they were able to schedule the delivery of the stool on Monday 10/22/2021.

## 2021-10-22 ENCOUNTER — Telehealth: Payer: Self-pay

## 2021-10-22 ENCOUNTER — Ambulatory Visit
Admission: RE | Admit: 2021-10-22 | Discharge: 2021-10-22 | Disposition: A | Discharge status: Home / Self Care | Payer: Medicare HMO | Source: Ambulatory Visit | Attending: Gastroenterology | Admitting: Gastroenterology

## 2021-10-22 NOTE — Progress Notes (Signed)
Spoke with the patient. She needed some help with a few questions on the form for the patient assistance for Esbriet. She will be taking to Dr.Flemings office for signatures and faxing.  [facility] CPP notified  Avel Sensor, [place] Assistant [phone]  Total time spent for month CPA: 20 min.

## 2021-10-23 ENCOUNTER — Ambulatory Visit: Payer: Medicare HMO | Admitting: Gastroenterology

## 2021-10-24 ENCOUNTER — Ambulatory Visit (INDEPENDENT_AMBULATORY_CARE_PROVIDER_SITE_OTHER): Payer: Medicare HMO | Admitting: Gastroenterology

## 2021-10-24 ENCOUNTER — Other Ambulatory Visit: Payer: Self-pay

## 2021-10-24 ENCOUNTER — Encounter: Payer: Self-pay | Admitting: Gastroenterology

## 2021-10-24 VITALS — BP 117/82 | HR 78 | Temp 98.3°F | Wt 123.0 lb

## 2021-10-24 DIAGNOSIS — A0472 Enterocolitis due to Clostridium difficile, not specified as recurrent: Secondary | ICD-10-CM | POA: Diagnosis not present

## 2021-10-24 NOTE — Progress Notes (Signed)
[name] MD, MRCP(U.K) [address]  [place]  [place], [place] [address]  Main: [phone]  Fax: [phone]   Primary Care Physician: [name], NP  Primary Gastroenterologist:  Dr. [name]   Chief Complaint  Patient presents with   fecal transplant    HPI: [name] is a 86 y.o. female  Summary of history :   Since approximately early 2021 she has been on treatment with sulfasalazine for ulcerative pancolitis.Has had a few episodes of recurrent C diff colitis.  She was seen in 2018 by [facility] GI  in 08/2008 she had a colonoscopy as per the last GI note showed chronic colitis with focal cryptitis.  Focal active colitis.   Not sure why it was not felt that this patient had inflammatory bowel disease.  At her initial visit she had been on Meloxicam for about 2-3  Months, 2 tablets a day and no other NSAID's.  She also did have blood in her stool at that point of time.   10/13/2018: EGD: normal . Sigmoidoscopy Discontinuous areas of nonbleeding ulcerated mucosa with no stigmata of recent bleeding were present in the sigmoid colon, in thedescending colon and in the distal transverse colon. Biopsies were taken with a cold forceps for histology.Rectum was normal. Pathology report showed chronic active colitis and normal rectal biopsies. HSV and CMV negative.    In February 2021 when she came to see me she had stopped meloxicam and doing well.  Was not having any diarrhea.   09/09/2019: Sigmoidoscopy: I went all the way to her mid transverse colon.  No evidence of colitis was seen.  There was a single solitary ulcer in the proximal descending colon.  No bleeding was seen.  Biopsies were taken with forceps.  And it demonstrated chronic colitis with mild to moderate activity and small crypt abscess. 09/13/2019: Vitamin D normal, hemoglobin 13.7 with a platelet count of 224 and a white cell count of 9.8.  CMP was normal.   06/02/2020 CRP 0.7, CMP showed a  calcium of 8.7 normal AST and ALT.  Hemoglobin on 06/01/2020 showed a normal study with normal differential count        08/04/2020: Flexible sigmoidoscopy: The mucosa of the entire examined colon appeared normal diverticulosis noted.  Biopsies taken.  Showed focal mild active colitis in the left colon and the rectum showed no active colitis.  01/03/2021: CRP less than 1, vitamin D normal, B12 normal 10/24/2020: Hemoglobin 12.8 g. 08/31/2021: Flexible sigmoidoscopy  Normal mucosa was found in the left colon. Biopsies were taken with a cold forceps for histology. Biopsies showed mild chronic proctocolitis.    Treated in  07/03/2020  on 07/11/2020, 03/03/2021 f, 09/2021 or C. difficile diarrhea.    Interval history 09/17/2021-10/24/2021     She is here today for fecal transplant, stopped dificid 24 hours back .         Current Outpatient Medications  Medication Sig Dispense Refill   atorvastatin (LIPITOR) 20 MG tablet Take 1 tablet (20 mg total) by mouth daily. For cholesterol. 90 tablet 3   benzonatate (TESSALON) 200 MG capsule Take 1 capsule (200 mg total) by mouth 3 (three) times daily as needed for cough. 30 capsule 0   budesonide (PULMICORT) 180 MCG/ACT inhaler Inhale 2 puffs into the lungs 2 (two) times daily.     denosumab (PROLIA) 60 MG/ML SOSY injection Inject 60 mg into the skin every 6 (six) months.     ESBRIET  267 MG TABS Take 267 mg by mouth 3 (three) times daily with meals.     famotidine (PEPCID) 20 MG tablet SMARTSIG:1 Tablet(s) By Mouth Every Evening     fidaxomicin (DIFICID) 200 MG TABS tablet Take 200 mg by mouth 2 (two) times daily.     fluconazole (DIFLUCAN) 100 MG tablet Take 2 tablets on Day#1, then decrease to 1 tablet daily. 15 tablet 0   HYDROcodone-acetaminophen (NORCO/VICODIN) 5-325 MG tablet Take 1 tablet by mouth 2 (two) times daily as needed.     Magnesium Oxide 500 MG CAPS Take 1 capsule (500 mg total) by mouth 2 (two) times daily. For pain. 180 capsule 0    metoprolol succinate (TOPROL-XL) 25 MG 24 hr tablet Take 25 mg by mouth every other day.     mupirocin ointment (BACTROBAN) 2 % Apply 1 application topically daily. With dressing changes 22 g 0   nystatin cream (MYCOSTATIN) Apply 1 application topically 2 (two) times daily. (Patient not taking: Reported on 10/16/2021) 30 g 1   nystatin-triamcinolone ointment (MYCOLOG) Apply 1 application topically 2 (two) times daily. 30 g 0   OXYGEN Inhale 2 L into the lungs at bedtime.     sulfaSALAzine (AZULFIDINE) 500 MG tablet Take 1 tablet (500 mg total) by mouth 2 (two) times daily. 180 tablet 3   vitamin B-12 (CYANOCOBALAMIN) 1000 MCG tablet Take 1,000 mcg by mouth every other day.     VOLTAREN 1 % GEL Apply 2 g topically in the morning, at noon, and at bedtime. Shoulders and back  2   No current facility-administered medications for this visit.    Allergies as of 10/24/2021   (No Known Allergies)    ROS:  General: Negative for anorexia, weight loss, fever, chills, fatigue, weakness. ENT: Negative for hoarseness, difficulty swallowing , nasal congestion. CV: Negative for chest pain, angina, palpitations, dyspnea on exertion, peripheral edema.  Respiratory: Negative for dyspnea at rest, dyspnea on exertion, cough, sputum, wheezing.  GI: See history of present illness. GU:  Negative for dysuria, hematuria, urinary incontinence, urinary frequency, nocturnal urination.  Endo: Negative for unusual weight change.    Physical Examination:   There were no vitals taken for this visit.  General: Well-nourished, well-developed in no acute distress.  Eyes: No icterus. Conjunctivae pink. Chaperon present in room : Fecal enema catheter inserted through anus into rectum and valve opened to transfer stool till bag completed  Neuro: Alert and oriented x 3.  Grossly intact. Skin: Warm and dry, no jaundice.   Psych: Alert and cooperative, normal mood and affect.   Imaging Studies: DG Chest 2  View  Addendum Date: 10/17/2021   ADDENDUM REPORT: 10/17/2021 16:53 IMPRESSION: Low lung volumes persist with background fibrosis and increased reticulonodular opacities bilaterally, either superimposed multifocal infection or progression of fibrosis. Electronically Signed   By: Corrie Mckusick D.O.   On: 10/17/2021 16:53   Result Date: 10/17/2021 CLINICAL DATA:  86 year old female with persisting cough EXAM: CHEST - 2 VIEW COMPARISON:  09/26/2020 FINDINGS: Cardiomediastinal silhouette unchanged in size and contour. Low lung volumes with reticular opacities of the bilateral lungs. Increased reticulonodular opacities in the mid lungs bilaterally. No pneumothorax. No pleural effusion. Asymmetric elevation left hemidiaphragm persists. Surgical changes of the left glenohumeral joint. Degenerative changes of the spine. IMPRESSION: Low lung volumes persist with background fibrosis and increased reticulonodular opacities bilaterally, either superimposed multifocal infection or progression of fibrosis. Electronically Signed: By: Corrie Mckusick D.O. On: 10/16/2021 14:29   CT CHEST W  CONTRAST  Result Date: 10/19/2021 CLINICAL DATA:  Cough. Chronic and persisting for greater than 8 weeks. Diffuse interstitial lung disease. EXAM: CT CHEST WITH CONTRAST TECHNIQUE: Multidetector CT imaging of the chest was performed during intravenous contrast administration. RADIATION DOSE REDUCTION: This exam was performed according to the departmental dose-optimization program which includes automated exposure control, adjustment of the mA and/or kV according to patient size and/or use of iterative reconstruction technique. CONTRAST:  34m OMNIPAQUE IOHEXOL 300 MG/ML  SOLN COMPARISON:  Chest two views 10/16/2021 AP chest 09/26/2020 chest two views 08/15/2020; CT chest 08/03/2019 FINDINGS: Cardiovascular: Heart size is moderately enlarged, similar to prior. Dense coronary artery calcifications are seen. No thoracic aortic aneurysm. The  main pulmonary artery measures up to 2.8 cm in caliber, at the upper limits of normal. No thoracic aortic aneurysm is seen. No pulmonary embolism is visualized. Mediastinum/Nodes: No axillary or hilar pathologically enlarged lymph nodes by CT criteria. There is a 1.3 cm short axis inferior right paratracheal lymph node is unchanged from 08/03/2019, likely reactive. The visualized thyroid is grossly unremarkable. The esophagus follows a normal course of normal caliber. Lungs/Pleura: The central airways are patent. Mild-to-moderate bilateral bronchiectasis similar to prior. Mild-to-moderate chronic elevation of the left hemidiaphragm. There are again bilateral interlobular septal thickening ground-glass attenuation and areas of mild honeycombing greatest within the left-greater-than-right lower lobes and also again extensively throughout the anterior inferior left upper lobe and lingula. Overall this has worsened compared to 08/03/2019 favored to represent worsening interstitial lung disease. An element of superimposed pulmonary edema is possible. No pleural effusion or pneumothorax. Upper Abdomen: No acute abnormality. Musculoskeletal: Postsurgical changes of reverse total left shoulder arthroplasty. Mild dextrocurvature of the mid to lower thoracic spine. Moderate multilevel degenerative disc changes. There is moderate to high-grade T12 vertebral body height loss, partially visualized. There is partially visualized kyphoplasty cement at T12 and L1. These levels were not included on the prior 08/03/2019 CT. These compression fractures are new compared to in 04/09/2016 chest CT. IMPRESSION:: IMPRESSION: 1. Compared to 08/03/2019 there are again changes of usual interstitial pneumonia left-greater-than-right. These appear mildly worsened compared to 08/03/2019. Some of the increased patchy ground-glass opacification seen on the current CT may relate to this worsening of the fibrosis versus mild superimposed pulmonary  edema. 2. Moderate cardiomegaly. Aortic Atherosclerosis (ICD10-I70.0). Electronically Signed   By: RYvonne KendallM.D.   On: 10/19/2021 16:26    Assessment and Plan:   Terri HOADLEYis a 86y.o. y/o female here to follow-up for chronic ulcerative colitis and has been doing well on sulfasalazine with no other GI symptoms.  Recent history of recurrent C. difficile colitis treated with vancomycin in 2021 and in November 2022 treated with Dificid..  She is up-to-date with her tetanus, influenza, DEXA scan and pneumococcal vaccination.  Health maintenance indicates she she has received Shingrix dose in 04/21/2020 and COVID vaccination.  Presenting her main issue is recurrent C. difficile colitis. 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    Plan 1. Can stop Dificid  2. Video or office visit in 2 weeks time to check how she is doing   I have discussed the risks of fecal transplant including but not limited to abdominal pain, diarrhea, abdominal distention, flatulence, nausea which can occur between 3% to 9% and the studies   Dr KJonathon Bellows MD,MRCP ([facility] Follow up in 2 weeks

## 2021-10-29 DIAGNOSIS — R059 Cough, unspecified: Secondary | ICD-10-CM | POA: Diagnosis not present

## 2021-10-29 DIAGNOSIS — M8580 Other specified disorders of bone density and structure, unspecified site: Secondary | ICD-10-CM | POA: Diagnosis not present

## 2021-10-29 DIAGNOSIS — J84111 Idiopathic interstitial pneumonia, not otherwise specified: Secondary | ICD-10-CM | POA: Diagnosis not present

## 2021-10-29 DIAGNOSIS — Z8619 Personal history of other infectious and parasitic diseases: Secondary | ICD-10-CM | POA: Diagnosis not present

## 2021-10-29 DIAGNOSIS — G8929 Other chronic pain: Secondary | ICD-10-CM | POA: Diagnosis not present

## 2021-10-29 DIAGNOSIS — R0689 Other abnormalities of breathing: Secondary | ICD-10-CM | POA: Diagnosis not present

## 2021-10-29 DIAGNOSIS — R5381 Other malaise: Secondary | ICD-10-CM | POA: Diagnosis not present

## 2021-10-29 DIAGNOSIS — R0600 Dyspnea, unspecified: Secondary | ICD-10-CM | POA: Diagnosis not present

## 2021-10-29 DIAGNOSIS — R1310 Dysphagia, unspecified: Secondary | ICD-10-CM | POA: Diagnosis not present

## 2021-10-29 DIAGNOSIS — R0602 Shortness of breath: Secondary | ICD-10-CM | POA: Diagnosis not present

## 2021-10-29 DIAGNOSIS — J984 Other disorders of lung: Secondary | ICD-10-CM | POA: Diagnosis not present

## 2021-10-29 DIAGNOSIS — R0902 Hypoxemia: Secondary | ICD-10-CM | POA: Diagnosis not present

## 2021-10-29 DIAGNOSIS — J8410 Pulmonary fibrosis, unspecified: Secondary | ICD-10-CM | POA: Diagnosis not present

## 2021-10-31 ENCOUNTER — Ambulatory Visit: Payer: Medicare Other | Admitting: Dermatology

## 2021-11-01 ENCOUNTER — Encounter: Payer: Self-pay | Admitting: Gastroenterology

## 2021-11-01 ENCOUNTER — Encounter: Payer: Medicare HMO | Admitting: Obstetrics and Gynecology

## 2021-11-02 NOTE — Telephone Encounter (Signed)
Go ahead with EGD march 23rd I see an open slot and order barium swallow with tablet

## 2021-11-05 ENCOUNTER — Other Ambulatory Visit: Payer: Self-pay

## 2021-11-05 DIAGNOSIS — R1319 Other dysphagia: Secondary | ICD-10-CM

## 2021-11-06 ENCOUNTER — Telehealth: Payer: Self-pay

## 2021-11-06 ENCOUNTER — Telehealth (INDEPENDENT_AMBULATORY_CARE_PROVIDER_SITE_OTHER): Payer: Medicare HMO | Admitting: Gastroenterology

## 2021-11-06 ENCOUNTER — Telehealth: Payer: Medicare Other

## 2021-11-06 DIAGNOSIS — A0472 Enterocolitis due to Clostridium difficile, not specified as recurrent: Secondary | ICD-10-CM

## 2021-11-06 DIAGNOSIS — K5190 Ulcerative colitis, unspecified, without complications: Secondary | ICD-10-CM | POA: Diagnosis not present

## 2021-11-06 DIAGNOSIS — R1310 Dysphagia, unspecified: Secondary | ICD-10-CM | POA: Diagnosis not present

## 2021-11-06 NOTE — Chronic Care Management (AMB) (Signed)
? ? ?  Chronic Care Management ?Pharmacy Assistant  ? ?Name: [name]  MRN: [id number] DOB: April 09, 1936 ? ? ?Reason for Encounter: Reminder Call ?  ?Conditions to be addressed/monitored: ?HLD and Pulmonary Disease ? ? ?Medications: ?Outpatient Encounter Medications as of 11/06/2021  ?Medication Sig  ? atorvastatin (LIPITOR) 20 MG tablet Take 1 tablet (20 mg total) by mouth daily. For cholesterol.  ? Biotin 10 MG CAPS Take 1 capsule by mouth daily.  ? budesonide (PULMICORT) 180 MCG/ACT inhaler Inhale 2 puffs into the lungs 2 (two) times daily.  ? citalopram (CELEXA) 40 MG tablet Take 40 mg by mouth daily.  ? denosumab (PROLIA) 60 MG/ML SOSY injection Inject 60 mg into the skin every 6 (six) months.  ? ESBRIET 267 MG TABS Take 267 mg by mouth 3 (three) times daily with meals.  ? famotidine (PEPCID) 20 MG tablet Take 1 tablet by mouth in the morning and at bedtime.  ? fidaxomicin (DIFICID) 200 MG TABS tablet Take 200 mg by mouth 2 (two) times daily.  ? fluconazole (DIFLUCAN) 100 MG tablet Take 2 tablets on Day#1, then decrease to 1 tablet daily.  ? gabapentin (NEURONTIN) 100 MG capsule Take 100 mg by mouth 2 (two) times daily.  ? HYDROcodone bit-homatropine (HYCODAN) 5-1.5 MG/5ML syrup Take by mouth.  ? Magnesium 500 MG CAPS Take 1 capsule by mouth as needed.  ? Magnesium Oxide 500 MG CAPS Take 1 capsule (500 mg total) by mouth 2 (two) times daily. For pain.  ? metoprolol succinate (TOPROL-XL) 25 MG 24 hr tablet Take 25 mg by mouth every other day.  ? mupirocin ointment (BACTROBAN) 2 % Apply 1 application topically daily. With dressing changes  ? nystatin cream (MYCOSTATIN) Apply 1 application topically 2 (two) times daily.  ? nystatin-triamcinolone ointment (MYCOLOG) Apply 1 application topically 2 (two) times daily.  ? OXYGEN Inhale 2 L into the lungs at bedtime.  ? Oxyquinoline-Sod Lauryl Sulf 0.025-0.01 % GEL Place 1 application. vaginally every 30 (thirty) days.  ? REBYOTA 150 ML SUSP Place rectally.  ?  sulfaSALAzine (AZULFIDINE) 500 MG tablet Take 1 tablet (500 mg total) by mouth 2 (two) times daily.  ? vitamin B-12 (CYANOCOBALAMIN) 1000 MCG tablet Take 1,000 mcg by mouth every other day.  ? VOLTAREN 1 % GEL Apply 2 g topically in the morning, at noon, and at bedtime. Shoulders and back  ? ?No facility-administered encounter medications on file as of 11/06/2021.  ? ?DANIELY AHUJA was contacted to remind of upcoming telephone visit with Al Corpus on 11/09/21 at 11:00am. Patient was reminded to have any blood glucose and blood pressure readings available for review at appointment. If unable to reach, a voicemail was left for patient.  ? ? ?Are you having any problems with your medications? No  Inquired about the Esbriet 267 mg tabs. The patient reports not hearing anything so far. Contacted [employer]Patient approved as of 11/06/21 for 1 year . Pharmacy is [facility] [phone].  They will need RX , escribed, faxed, or verbal authorization accepted. Patient has to call  to initiate first delivery. 30DS and it will be shipped to patients address.  ? ?Do you have any concerns you like to discuss with the pharmacist? No ? ? ? ?Star Rating Drugs: ?Medication:  Last Fill: Day Supply ?Atorvastatin 20mg  11/02/21  90 ? ?Al Corpus, [facility]  CPP notified ? ?Nathan Stallworth, CCMA ?Clincal Pharmacy Assistant ?[phone] ? ? ?

## 2021-11-06 NOTE — Progress Notes (Signed)
[name] , MD [address]  [place]  [place], [place] [address]  Main: [phone]  Fax: [phone]   Primary Care Physician: [name], NP  Virtual Visit via Video Note  I connected with patient on 11/06/21 at  2:00 PM EST by video and verified that I am speaking with the correct person using two identifiers.   I discussed the limitations, risks, security and privacy concerns of performing an evaluation and management service by video  and the availability of in person appointments. I also discussed with the patient that there may be a patient responsible charge related to this service. The patient expressed understanding and agreed to proceed.  Location of Patient: Home Location of Provider: Home Persons involved: Patient and provider only   History of Present Illness: No chief complaint on file.   HPI: [name] is a 86 y.o. female  ummary of history :   Since approximately early 2021 she has been on treatment with sulfasalazine for ulcerative pancolitis.Has had a few episodes of recurrent C diff colitis.  She was seen in 2018 by [facility] GI  in 08/2008 she had a colonoscopy as per the last GI note showed chronic colitis with focal cryptitis.  Focal active colitis.   Not sure why it was not felt that this patient had inflammatory bowel disease.  At her initial visit she had been on Meloxicam for about 2-3  Months, 2 tablets a day and no other NSAID's.  She also did have blood in her stool at that point of time.   10/13/2018: EGD: normal . Sigmoidoscopy Discontinuous areas of nonbleeding ulcerated mucosa with no stigmata of recent bleeding were present in the sigmoid colon, in thedescending colon and in the distal transverse colon. Biopsies were taken with a cold forceps for histology.Rectum was normal. Pathology report showed chronic active colitis and normal rectal biopsies. HSV and CMV negative.    In February 2021 when she came to see me  she had stopped meloxicam and doing well.  Was not having any diarrhea.   09/09/2019: Sigmoidoscopy: I went all the way to her mid transverse colon.  No evidence of colitis was seen.  There was a single solitary ulcer in the proximal descending colon.  No bleeding was seen.  Biopsies were taken with forceps.  And it demonstrated chronic colitis with mild to moderate activity and small crypt abscess.   08/04/2020: Flexible sigmoidoscopy: The mucosa of the entire examined colon appeared normal diverticulosis noted.  Biopsies taken.  Showed focal mild active colitis in the left colon and the rectum showed no active colitis.  01/03/2021: CRP less than 1, vitamin D normal, B12 normal 10/24/2020: Hemoglobin 12.8 g.   Treated in  07/03/2020 for C. difficile diarrhea.  on 07/11/2020, 03/03/2021    08/31/2021: Flexible sigmoidoscopy  Normal mucosa was found in the left colon. Biopsies were taken with a cold forceps for histology. Biopsies showed mild chronic proctocolitis.    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  She is taking 2 tablets of sulfasalazine a day.  No rectal bleeding.  Did not take the Dificid after the stool transplant as advised.  She was recently seen by [facility].  She was complaining of swallowing issues when she was recently seen at [facility] and was  advised an endoscopy and barium study which she has been scheduled for     Current Outpatient Medications  Medication Sig Dispense Refill   atorvastatin (LIPITOR) 20 MG tablet Take 1 tablet (20 mg total) by mouth daily. For cholesterol. 90 tablet 3   Biotin 10 MG CAPS Take 1 capsule by mouth daily.     budesonide (PULMICORT) 180 MCG/ACT inhaler Inhale 2 puffs into the lungs 2 (two) times daily.     citalopram (CELEXA) 40 MG tablet Take 40 mg by mouth daily.     denosumab  (PROLIA) 60 MG/ML SOSY injection Inject 60 mg into the skin every 6 (six) months.     ESBRIET 267 MG TABS Take 267 mg by mouth 3 (three) times daily with meals.     famotidine (PEPCID) 20 MG tablet Take 1 tablet by mouth in the morning and at bedtime.     fidaxomicin (DIFICID) 200 MG TABS tablet Take 200 mg by mouth 2 (two) times daily.     fluconazole (DIFLUCAN) 100 MG tablet Take 2 tablets on Day#1, then decrease to 1 tablet daily. 15 tablet 0   gabapentin (NEURONTIN) 100 MG capsule Take 100 mg by mouth 2 (two) times daily.     HYDROcodone bit-homatropine (HYCODAN) 5-1.5 MG/5ML syrup Take by mouth.     Magnesium 500 MG CAPS Take 1 capsule by mouth as needed.     Magnesium Oxide 500 MG CAPS Take 1 capsule (500 mg total) by mouth 2 (two) times daily. For pain. 180 capsule 0   metoprolol succinate (TOPROL-XL) 25 MG 24 hr tablet Take 25 mg by mouth every other day.     mupirocin ointment (BACTROBAN) 2 % Apply 1 application topically daily. With dressing changes 22 g 0   nystatin cream (MYCOSTATIN) Apply 1 application topically 2 (two) times daily. 30 g 1   nystatin-triamcinolone ointment (MYCOLOG) Apply 1 application topically 2 (two) times daily. 30 g 0   OXYGEN Inhale 2 L into the lungs at bedtime.     Oxyquinoline-Sod Lauryl Sulf 0.025-0.01 % GEL Place 1 application. vaginally every 30 (thirty) days.     REBYOTA 150 ML SUSP Place rectally.     sulfaSALAzine (AZULFIDINE) 500 MG tablet Take 1 tablet (500 mg total) by mouth 2 (two) times daily. 180 tablet 3   vitamin B-12 (CYANOCOBALAMIN) 1000 MCG tablet Take 1,000 mcg by mouth every other day.     VOLTAREN 1 % GEL Apply 2 g topically in the morning, at noon, and at bedtime. Shoulders and back  2   No current facility-administered medications for this visit.    Allergies as of 11/06/2021   (No Known Allergies)    Review of Systems:    All systems reviewed and negative except where noted in HPI.  General Appearance:    Alert, cooperative,  no distress, appears stated age  Head:    Normocephalic, without obvious abnormality, atraumatic  Eyes:    PERRL, conjunctiva/corneas clear,  Ears:    Grossly normal hearing    Neurologic:  Grossly normal    Observations/Objective:  Labs: CMP     Component Value Date/Time   NA 139 04/21/2021 0409   NA 139 09/21/2020 1320   NA 139 02/09/2014 0500   K 4.4 04/21/2021 0409   K 3.2 (L) 02/09/2014 0500   CL 106 04/21/2021 0409   CL 106 02/09/2014 0500   CO2 28 04/21/2021 0409   CO2 27 02/09/2014 0500   GLUCOSE 126 (H) 04/21/2021 0409  GLUCOSE 85 02/09/2014 0500   BUN 24 (H) 04/21/2021 0409   BUN 19 09/21/2020 1320   BUN 11 02/09/2014 0500   CREATININE 0.80 10/19/2021 1548   CREATININE 0.78 02/09/2014 0500   CALCIUM 9.2 04/21/2021 0409   CALCIUM 8.8 02/09/2014 0500   PROT 7.0 04/21/2021 0409   PROT 6.7 08/01/2020 1419   PROT 6.7 02/08/2014 1247   ALBUMIN 3.8 04/21/2021 0409   ALBUMIN 3.9 08/01/2020 1419   ALBUMIN 3.2 (L) 02/08/2014 1247   AST 36 04/21/2021 0409   AST 35 02/08/2014 1247   ALT 25 04/21/2021 0409   ALT 23 02/08/2014 1247   ALKPHOS 69 04/21/2021 0409   ALKPHOS 101 02/08/2014 1247   BILITOT 0.6 04/21/2021 0409   BILITOT 0.4 08/01/2020 1419   BILITOT 0.7 02/08/2014 1247   GFRNONAA >60 04/21/2021 0409   GFRNONAA >60 02/09/2014 0500   GFRAA 76 09/21/2020 1320   GFRAA >60 02/09/2014 0500   Lab Results  Component Value Date   WBC 15.8 (H) 04/21/2021   HGB 13.7 04/21/2021   HCT 41.3 04/21/2021   MCV 103.5 (H) 04/21/2021   PLT 263 04/21/2021    Imaging Studies: DG Chest 2 View  Addendum Date: 10/17/2021   ADDENDUM REPORT: 10/17/2021 16:53 IMPRESSION: Low lung volumes persist with background fibrosis and increased reticulonodular opacities bilaterally, either superimposed multifocal infection or progression of fibrosis. Electronically Signed   By: Corrie Mckusick D.O.   On: 10/17/2021 16:53   Result Date: 10/17/2021 CLINICAL DATA:  86 year old female with  persisting cough EXAM: CHEST - 2 VIEW COMPARISON:  09/26/2020 FINDINGS: Cardiomediastinal silhouette unchanged in size and contour. Low lung volumes with reticular opacities of the bilateral lungs. Increased reticulonodular opacities in the mid lungs bilaterally. No pneumothorax. No pleural effusion. Asymmetric elevation left hemidiaphragm persists. Surgical changes of the left glenohumeral joint. Degenerative changes of the spine. IMPRESSION: Low lung volumes persist with background fibrosis and increased reticulonodular opacities bilaterally, either superimposed multifocal infection or progression of fibrosis. Electronically Signed: By: Corrie Mckusick D.O. On: 10/16/2021 14:29   CT CHEST W CONTRAST  Result Date: 10/19/2021 CLINICAL DATA:  Cough. Chronic and persisting for greater than 8 weeks. Diffuse interstitial lung disease. EXAM: CT CHEST WITH CONTRAST TECHNIQUE: Multidetector CT imaging of the chest was performed during intravenous contrast administration. RADIATION DOSE REDUCTION: This exam was performed according to the departmental dose-optimization program which includes automated exposure control, adjustment of the mA and/or kV according to patient size and/or use of iterative reconstruction technique. CONTRAST:  43m OMNIPAQUE IOHEXOL 300 MG/ML  SOLN COMPARISON:  Chest two views 10/16/2021 AP chest 09/26/2020 chest two views 08/15/2020; CT chest 08/03/2019 FINDINGS: Cardiovascular: Heart size is moderately enlarged, similar to prior. Dense coronary artery calcifications are seen. No thoracic aortic aneurysm. The main pulmonary artery measures up to 2.8 cm in caliber, at the upper limits of normal. No thoracic aortic aneurysm is seen. No pulmonary embolism is visualized. Mediastinum/Nodes: No axillary or hilar pathologically enlarged lymph nodes by CT criteria. There is a 1.3 cm short axis inferior right paratracheal lymph node is unchanged from 08/03/2019, likely reactive. The visualized thyroid is  grossly unremarkable. The esophagus follows a normal course of normal caliber. Lungs/Pleura: The central airways are patent. Mild-to-moderate bilateral bronchiectasis similar to prior. Mild-to-moderate chronic elevation of the left hemidiaphragm. There are again bilateral interlobular septal thickening ground-glass attenuation and areas of mild honeycombing greatest within the left-greater-than-right lower lobes and also again extensively throughout the anterior inferior left upper lobe and  lingula. Overall this has worsened compared to 08/03/2019 favored to represent worsening interstitial lung disease. An element of superimposed pulmonary edema is possible. No pleural effusion or pneumothorax. Upper Abdomen: No acute abnormality. Musculoskeletal: Postsurgical changes of reverse total left shoulder arthroplasty. Mild dextrocurvature of the mid to lower thoracic spine. Moderate multilevel degenerative disc changes. There is moderate to high-grade T12 vertebral body height loss, partially visualized. There is partially visualized kyphoplasty cement at T12 and L1. These levels were not included on the prior 08/03/2019 CT. These compression fractures are new compared to in 04/09/2016 chest CT. IMPRESSION:: IMPRESSION: 1. Compared to 08/03/2019 there are again changes of usual interstitial pneumonia left-greater-than-right. These appear mildly worsened compared to 08/03/2019. Some of the increased patchy ground-glass opacification seen on the current CT may relate to this worsening of the fibrosis versus mild superimposed pulmonary edema. 2. Moderate cardiomegaly. Aortic Atherosclerosis (ICD10-I70.0). Electronically Signed   By: Yvonne Kendall M.D.   On: 10/19/2021 16:26    Assessment and Plan:   [name] is a 86 y.o. y/o female here to follow-up for chronic ulcerative colitis and has been doing well on sulfasalazine with no other GI symptoms.  Recent history of recurrent C. difficile colitis treated with  vancomycin in 2021 and in November 2022 treated with Dificid..  She is up-to-date with her tetanus, influenza, DEXA scan and pneumococcal vaccination.  Health maintenance indicates she she has received Shingrix dose in 04/21/2020 and COVID vaccination.  Presenting her main issue is recurrent C. difficile colitis.  She has  has a stool transplant on 10/24/2021 subsequently based on her history she has semiformed stools up to 4 times a day.  I am unclear at this point of time if this is recurrence.  Or probably a side effect of sulfasalazine.   Plan 1.  Continue sulfasalazine but increase to 1 tablet today to see if there is any improvement. 2.  Proceed with endoscopy for dysphagia as scheduled on 11/22/2021 3.  Follow-up in 6 to 8 weeks video visit to discuss how she is doing        I discussed the assessment and treatment plan with the patient. The patient was provided an opportunity to ask questions and all were answered. The patient agreed with the plan and demonstrated an understanding of the instructions.   The patient was advised to call back or seek an in-person evaluation if the symptoms worsen or if the condition fails to improve as anticipated.  I provided 14 minutes of face-to-face time during this encounter.  Dr [name] MD,MRCP [facility]) Gastroenterology/Hepatology Pager: [phone]   Speech recognition software was used to dictate this note.

## 2021-11-07 ENCOUNTER — Ambulatory Visit: Payer: Medicare Other | Admitting: Gastroenterology

## 2021-11-09 ENCOUNTER — Other Ambulatory Visit: Payer: Self-pay

## 2021-11-09 ENCOUNTER — Ambulatory Visit (INDEPENDENT_AMBULATORY_CARE_PROVIDER_SITE_OTHER): Payer: Medicare HMO | Admitting: Pharmacist

## 2021-11-09 DIAGNOSIS — M8580 Other specified disorders of bone density and structure, unspecified site: Secondary | ICD-10-CM

## 2021-11-09 DIAGNOSIS — F419 Anxiety disorder, unspecified: Secondary | ICD-10-CM

## 2021-11-09 DIAGNOSIS — J84112 Idiopathic pulmonary fibrosis: Secondary | ICD-10-CM

## 2021-11-09 DIAGNOSIS — G894 Chronic pain syndrome: Secondary | ICD-10-CM

## 2021-11-09 DIAGNOSIS — E7800 Pure hypercholesterolemia, unspecified: Secondary | ICD-10-CM

## 2021-11-09 DIAGNOSIS — Z8619 Personal history of other infectious and parasitic diseases: Secondary | ICD-10-CM

## 2021-11-09 DIAGNOSIS — I10 Essential (primary) hypertension: Secondary | ICD-10-CM

## 2021-11-09 NOTE — Patient Instructions (Addendum)
Visit Information ? ?Phone number for Pharmacist: [phone] ? ? Goals Addressed   ?None ?  ? ? ?Care Plan : [place]  ?Updates made by Charlton Haws, RPH since 11/09/2021 12:00 AM  ?  ? ?Problem: Hyperlipidemia, Anxiety, and Osteoporosis, Chronic pain, Ulcerative colitis, Idiopathic pulmonary fibrosis   ?Priority: High  ?  ? ?Long-Range Goal: Disease mgmt   ?Start Date: 08/12/2021  ?Expected End Date: 08/12/2022  ?Recent Progress: On track  ?Priority: High  ?Note:   ?Current Barriers:  ?Unable to independently monitor therapeutic efficacy ? ?Pharmacist Clinical Goal(s):  ?Patient will achieve adherence to monitoring guidelines and medication adherence to achieve therapeutic efficacy through collaboration with PharmD and provider.  ? ?Interventions: ?1:1 collaboration with [name], NP regarding development and update of comprehensive plan of care as evidenced by provider attestation and co-signature ?Inter-disciplinary care team collaboration (see longitudinal plan of care) ?Comprehensive medication review performed; medication list updated in electronic medical record ? ?Hypertension / Cardiomyopathy (BP goal <140/90) ?-Controlled - pt report limited palpitations; she is not checking BP/HR often but SBP is usually 100-120, she denies lightheadedness ?-Hx palpitations/tachycardia ?-Current treatment: ?Metoprolol succinate 25 mg - every other day - Appropriate, Effective, Safe, Accessible ?-Denies hypotensive/hypertensive symptoms ?-Educated on BP goals and benefits of medications for prevention of heart attack, stroke and kidney damage; ?-Counseled to monitor BP at home periodically ?-Recommended to continue current medication ? ?Hyperlipidemia: (LDL goal < 100) ?-Controlled -baseline LDL 141, improved to 106 after statin initiation ?-Current treatment: ?Atorvastatin 20 mg daily -Appropriate, Effective, Safe, Accessible ?-Educated on Cholesterol goals; Benefits of statin for ASCVD  risk reduction; ?-Recommended to continue current medication ? ?Anxiety (Goal: manage symptoms) ?-Controlled - per pt report ?-Qtc 445 (04/21/21) ?-PHQ9: 0 (no/minimal depression) ?-GAD7: not on file ?-Connected with PCP for mental health support ?-Current treatment: ?Citalopram 40 mg daily -Appropriate, Effective, Safe, Accessible ?-Educated on Benefits of medication for symptom control ?-Recommended to continue current medication ? ?Osteoporosis (Goal prevent fractures) ?-Controlled - pt reports she gets Prolia twice a year ?-Last DEXA Scan: 10/01/18  ? T-Score femoral neck: -2.6 ? T-Score forearm radius: -1.4 ?-Patient is a candidate for pharmacologic treatment due to history of vertebral fracture and T-Score < -2.5 in femoral neck ?-Current treatment  ?Prolia (started 09/2018) - Appropriate, Effective, Safe, Accessible ?Calcium carbonate 600 mg BID -Appropriate, Effective, Safe, Accessible ?Vitamin D 5000 IU daily -Appropriate, Effective, Safe, Accessible ?-Recommend weight-bearing and muscle strengthening exercises for building and maintaining bone density. - seated exercises, pedals ?-Recommended to continue current medication ? ?Idiopathic pulmonary fibrosis (Goal: manage symptoms) ?-Not ideally controlled - pt reports severe coughing w/ mucus; Delsym/OTC cough did not help; cough drops have helped some; she has been approved for Esbriet PAP through pulmonary office ?-Follows with pulmonary, Dr Raul Del. ?-Recently referred for IPF specialist at [facility] ?-Current treatment  ?Pirfenidone (Esbriet) 267 mg - taking 1 tablet TID (PAP) - Appropriate, Effective, Safe, Accessible ?Budesonide 180 mct/act 2 puff BID - Appropriate, Query Effective, (per pt report not reallyhelping) ?Oxygen 2 L - at night ?-Medications previously tried: gabapentin (ineffective)  ?-Recommend to continue current medication ? ?Chronic pain (Goal: manage pain) ?-Controlled ?-Follows with pain mgmt, Dr Dossie Arbour; hx lumbar DDD, compression fracture  of L1, L3, L4, T12; s/p hip replacement; shoulder pain; cervicalgia; pt gets steroid injections periodically ?-Hydrocodone is prescribed by NP Allyson Sabal ([facility]) separate from Dr Dossie Arbour; pt is interested in consolidating her pain mgmt providers so she does not have to  go to 2 providers ?-Current treatment  ?Hydrocodone-APAP 5-325 mg BID prn - #60 per month - Appropriate, Effective, Safe, Accessible ?Voltaren gel (shoulder) ?-Advised pt to discuss with Dr Dossie Arbour about consolidating providers/asking him to take over prescribing of hydrocodone ?-Recommended to continue current medication ? ?Ulcerative colitis (Goal: manage symptoms, prevent flares) ?-Not ideally controlled - pt reports persistent diarrhea after Cdiff treatment 11/2. She recently had fecal transplant done at GI and is not sure how much it has helped ?-Follows with GI, Dr Vicente Males. Hx of recurrent C.Diff ?-Current treatment  ?Sulfasalazine 500 mg BID - Appropriate, Effective, Safe, Accessible ?-Recommend to continue current medication; advised pt to follow up with GI  ? ?Health Maintenance ?-Vaccine gaps: Flu, covid booster, Shingrix, TDAP ?-Advised flu vaccine at next office visit or at pharamacy ? ?Patient Goals/Self-Care Activities ?Patient will:  ?- take medications as prescribed as evidenced by patient report and record review ?focus on medication adherence by pill box ?collaborate with provider on medication access solutions (Bring Esbriet forms to Pulmonary) ?-Contact GI regarding persistent diarrhea ?  ?  ? ?Patient verbalizes understanding of instructions and care plan provided today and agrees to view in [place]. Active MyChart status confirmed with patient.   ?Telephone follow up appointment with pharmacy team member scheduled for: 1 year ? ?Charlene Brooke, PharmD, BCACP ?Clinical Pharmacist ?[facility] ?[phone] ?  ?

## 2021-11-09 NOTE — Progress Notes (Signed)
Chronic Care Management Pharmacy Note  11/09/2021 Name:  DANESE [name] MRN:  [id number] DOB:  [birth year]  Summary: CCM F/U visit -Pt was approved for Esbriet PAP through pulmonary office, she is appreciative of help with application -Pt has just completed fecal transplant with GI and is not sure it is helping  Recommendations/Changes made from today's visit: -No med changes  Plan: -[facility] will call patient in 6 months for general adherence review -Pharmacist follow up televisit scheduled for 1 year -PCP physical 02/26/22    Subjective: [name] is an 86 y.o. year old female who is a primary patient of [name], NP.  The CCM team was consulted for assistance with disease management and care coordination needs.    Engaged with patient by telephone for follow up visit in response to provider referral for pharmacy case management and/or care coordination services.   Consent to Services:  The patient was given information about Chronic Care Management services, agreed to services, and gave verbal consent prior to initiation of services.  Please see initial visit note for detailed documentation.   Patient Care Team: [name], NP as PCP - General (Internal Medicine) Leandrew Koyanagi, MD as Referring Physician (Ophthalmology) Erby Pian, MD as Referring Physician (Specialist) Yolonda Kida, MD as Consulting Physician (Cardiology) Rubie Maid, MD as Referring Physician (Obstetrics and Gynecology) Catalina Antigua as Referring Physician (Physician Assistant) Thornton Park, MD as Referring Physician (Orthopedic Surgery) Charlton Haws, [facility] as Pharmacist (Pharmacist)  Recent office visits: 10/16/21 PCP OV: c/o cough. Rx'd benzonatate, referred to [facility] IPF specialist. CXR with possible IPF progression, ordered CT.  02/23/21-PCP-Katherine Clark,NP-Patient presented for follow up anxiety and tremors.Labs  ordered(abnormal) start Atorvastatin 50m take 1 daily   02/16/21-Family Medicine-Telemedicine encounter for AWV. Educated on vaccines,screenings,no medication changes  Recent consult visits: 10/24/21 Dr AVicente Males(GI): f/u Cdiff - presented for fecal transplant. Stop Dificid. 10/04/21 Dr CMarcelline Mates(OB/GYN): Dx yeast dermatitis. Rx'd Fluconazole and nystatin-triamcinolone oint. 09/26/21 Dr NDossie Arbour(Pain mgmt): f/u chronic pain (back, shoulde). 07/31/21-[facility] -Francisco Naveira,MD-Patient presented for lumbar injection.Triamcinolone Acetonide 455m 07/24/21-Francisco Naveira,MD-Patient presented for follow up after a fall.Xrays reviewed and procedure scheduled.No medication changes 07/04/21 Dr AnVicente MalesGI) TE - Cdiff PCR positive, sent fidaxomicin x 10 days. 07/03/21-Francisco Naveira,MD-Telemedicine-Patient presented for follow up procedure. Future xrays ordered.No medication changes 07/02/21-Dermatology-David Kowalski,MD-Patient presented for follow up basil cell carcinoma excision.No medication changes  06/19/21-Dermatology-David Kowalski,MD-Patient presented for post op.Discussed pathology,suture removal,no medication changes 06/14/21-Dermatology-Patient presented for procedure to remove skin lesion right side of neck. Started Bactroban 2% apply topically daily  06/14/21-Cardiology-Dwayne Callwood,MD-Patient presented for follow up cardiomyopathy-reduce metoprolol to half patient fatigue sluggishness slightly bradycardic,have the patient follow-up with pulmonary for inhalers oxygen respiratory therapy 06/05/21-[facility]-Francisco Naveira,MD-Patient presented for Lumbar Facet injections.No medication changes. 05/22/21-Pulmonology-Herbon Fleming,MD-Patient presented for pulmonary function test.Follow up pulmonary fibrosis.Trial of Pulmicort BID, Trial of gabapentin 100 mg q 12 hrs and titrate up as indicated and tolerated 05/17/21-[facility]-Francisco  Naveira,MD-Patient presented for left shoulder injection-Methylprednisolone Acetate 8029m/8/22-Urology-Brian Sninsky,MD-Patient presented for removal of ureteral stent.Bactrim given for prophylaxis 05/03/21-Dermatology-David Kowalski,MD-Patient presented for actinic keratosis,no medication changes 04/26/21-Urology-Brian Sninsky,MD-Patient presented for initial visit for kidney stones.Left ureteroscopy, laser lithotripsy, stent placement this week 04/24/21-[facility]-Francisco Naveira,MD-Patient presented for follow up back pain, procedures, start Calcium carbonate 1500m34mtablet 2 times daily ,magnesium Oxide 500mg90me 1 two times daily,Vitamin D3 5000units  take 1 daily  04/17/21-Pulmonology-Herbon Fleming,MD-no  Chronic Care Management Pharmacy Note  11/09/2021 Name:  DANESE [name] MRN:  [id number] DOB:  [birth year]  Summary: CCM F/U visit -Pt was approved for Esbriet PAP through pulmonary office, she is appreciative of help with application -Pt has just completed fecal transplant with GI and is not sure it is helping  Recommendations/Changes made from today's visit: -No med changes  Plan: -[facility] will call patient in 6 months for general adherence review -Pharmacist follow up televisit scheduled for 1 year -PCP physical 02/26/22    Subjective: [name] is an 86 y.o. year old female who is a primary patient of [name], NP.  The CCM team was consulted for assistance with disease management and care coordination needs.    Engaged with patient by telephone for follow up visit in response to provider referral for pharmacy case management and/or care coordination services.   Consent to Services:  The patient was given information about Chronic Care Management services, agreed to services, and gave verbal consent prior to initiation of services.  Please see initial visit note for detailed documentation.   Patient Care Team: [name], NP as PCP - General (Internal Medicine) Leandrew Koyanagi, MD as Referring Physician (Ophthalmology) Erby Pian, MD as Referring Physician (Specialist) Yolonda Kida, MD as Consulting Physician (Cardiology) Rubie Maid, MD as Referring Physician (Obstetrics and Gynecology) Catalina Antigua as Referring Physician (Physician Assistant) Thornton Park, MD as Referring Physician (Orthopedic Surgery) Charlton Haws, [facility] as Pharmacist (Pharmacist)  Recent office visits: 10/16/21 PCP OV: c/o cough. Rx'd benzonatate, referred to [facility] IPF specialist. CXR with possible IPF progression, ordered CT.  02/23/21-PCP-Katherine Clark,NP-Patient presented for follow up anxiety and tremors.Labs  ordered(abnormal) start Atorvastatin 50m take 1 daily   02/16/21-Family Medicine-Telemedicine encounter for AWV. Educated on vaccines,screenings,no medication changes  Recent consult visits: 10/24/21 Dr AVicente Males(GI): f/u Cdiff - presented for fecal transplant. Stop Dificid. 10/04/21 Dr CMarcelline Mates(OB/GYN): Dx yeast dermatitis. Rx'd Fluconazole and nystatin-triamcinolone oint. 09/26/21 Dr NDossie Arbour(Pain mgmt): f/u chronic pain (back, shoulde). 07/31/21-[facility] -Francisco Naveira,MD-Patient presented for lumbar injection.Triamcinolone Acetonide 455m 07/24/21-Francisco Naveira,MD-Patient presented for follow up after a fall.Xrays reviewed and procedure scheduled.No medication changes 07/04/21 Dr AnVicente MalesGI) TE - Cdiff PCR positive, sent fidaxomicin x 10 days. 07/03/21-Francisco Naveira,MD-Telemedicine-Patient presented for follow up procedure. Future xrays ordered.No medication changes 07/02/21-Dermatology-David Kowalski,MD-Patient presented for follow up basil cell carcinoma excision.No medication changes  06/19/21-Dermatology-David Kowalski,MD-Patient presented for post op.Discussed pathology,suture removal,no medication changes 06/14/21-Dermatology-Patient presented for procedure to remove skin lesion right side of neck. Started Bactroban 2% apply topically daily  06/14/21-Cardiology-Dwayne Callwood,MD-Patient presented for follow up cardiomyopathy-reduce metoprolol to half patient fatigue sluggishness slightly bradycardic,have the patient follow-up with pulmonary for inhalers oxygen respiratory therapy 06/05/21-[facility]-Francisco Naveira,MD-Patient presented for Lumbar Facet injections.No medication changes. 05/22/21-Pulmonology-Herbon Fleming,MD-Patient presented for pulmonary function test.Follow up pulmonary fibrosis.Trial of Pulmicort BID, Trial of gabapentin 100 mg q 12 hrs and titrate up as indicated and tolerated 05/17/21-[facility]-Francisco  Naveira,MD-Patient presented for left shoulder injection-Methylprednisolone Acetate 8029m/8/22-Urology-Brian Sninsky,MD-Patient presented for removal of ureteral stent.Bactrim given for prophylaxis 05/03/21-Dermatology-David Kowalski,MD-Patient presented for actinic keratosis,no medication changes 04/26/21-Urology-Brian Sninsky,MD-Patient presented for initial visit for kidney stones.Left ureteroscopy, laser lithotripsy, stent placement this week 04/24/21-[facility]-Francisco Naveira,MD-Patient presented for follow up back pain, procedures, start Calcium carbonate 1500m34mtablet 2 times daily ,magnesium Oxide 500mg90me 1 two times daily,Vitamin D3 5000units  take 1 daily  04/17/21-Pulmonology-Herbon Fleming,MD-no  ° °Chronic Care Management °Pharmacy Note ° °11/09/2021 °Name:  Kauri J Fairburn MRN:  [id number] DOB:  06/24/1936 ° °Summary: CCM F/U visit °-Pt was approved for Esbriet PAP through pulmonary office, she is appreciative of help with application °-Pt has just completed fecal transplant with GI and is not sure it is helping ° °Recommendations/Changes made from today's visit: °-No med changes ° °Plan: °-[facility] will call patient in 6 months for general adherence review °-Pharmacist follow up televisit scheduled for 1 year °-PCP physical 02/26/22 ° ° ° °Subjective: °[name] is an 85 y.o. year old female who is a primary patient of Clark, Katherine K, NP.  The CCM team was consulted for assistance with disease management and care coordination needs.   ° °Engaged with patient by telephone for follow up visit in response to provider referral for pharmacy case management and/or care coordination services.  ° °Consent to Services:  °The patient was given information about Chronic Care Management services, agreed to services, and gave verbal consent prior to initiation of services.  Please see initial visit note for detailed documentation.  ° °Patient Care Team: °Clark, Katherine K, NP as PCP - General (Internal Medicine) °Brasington, Chadwick, MD as Referring Physician (Ophthalmology) °Fleming, Herbon E, MD as Referring Physician (Specialist) °Callwood, Dwayne D, MD as Consulting Physician (Cardiology) °Cherry, Anika, MD as Referring Physician (Obstetrics and Gynecology) °Alves, Leonilde G, PA-C as Referring Physician (Physician Assistant) °Krasinski, Kevin, MD as Referring Physician (Orthopedic Surgery) °Johnhenry Tippin N, RPH as Pharmacist (Pharmacist) ° °Recent office visits: °10/16/21 PCP OV: c/o cough. Rx'd benzonatate, referred to [facility] IPF specialist. CXR with possible IPF progression, ordered CT. ° °02/23/21-PCP-Katherine Clark,NP-Patient presented for follow up anxiety and tremors.Labs  ordered(abnormal) start Atorvastatin 20mg take 1 daily  ° °02/16/21-Family Medicine-Telemedicine encounter for AWV. Educated on vaccines,screenings,no medication changes ° °Recent consult visits: °10/24/21 Dr Anna (GI): f/u Cdiff - presented for fecal transplant. Stop Dificid. °10/04/21 Dr Cherry (OB/GYN): Dx yeast dermatitis. Rx'd Fluconazole and nystatin-triamcinolone oint. °09/26/21 Dr Naveira (Pain mgmt): f/u chronic pain (back, shoulde). °07/31/21-[facility] -Francisco Naveira,MD-Patient presented for lumbar injection.Triamcinolone Acetonide 40mg. °07/24/21-Francisco Naveira,MD-Patient presented for follow up after a fall.Xrays reviewed and procedure scheduled.No medication changes °07/04/21 Dr Anna (GI) TE - Cdiff PCR positive, sent fidaxomicin x 10 days. °07/03/21-Francisco Naveira,MD-Telemedicine-Patient presented for follow up procedure. Future xrays ordered.No medication changes °07/02/21-Dermatology-David Kowalski,MD-Patient presented for follow up basil cell carcinoma excision.No medication changes  °06/19/21-Dermatology-David Kowalski,MD-Patient presented for post op.Discussed pathology,suture removal,no medication changes °06/14/21-Dermatology-Patient presented for procedure to remove skin lesion right side of neck. Started Bactroban 2% apply topically daily  °06/14/21-Cardiology-Dwayne Callwood,MD-Patient presented for follow up cardiomyopathy-reduce metoprolol to half patient fatigue sluggishness slightly bradycardic,have the patient follow-up with pulmonary for inhalers oxygen respiratory therapy °06/05/21-[facility]-Francisco Naveira,MD-Patient presented for Lumbar Facet injections.No medication changes. °05/22/21-Pulmonology-Herbon Fleming,MD-Patient presented for pulmonary function test.Follow up pulmonary fibrosis.Trial of Pulmicort BID, Trial of gabapentin 100 mg q 12 hrs and titrate up as indicated and tolerated °05/17/21-[facility]-Francisco  Naveira,MD-Patient presented for left shoulder injection-Methylprednisolone Acetate 80mg °05/10/21-Urology-Brian Sninsky,MD-Patient presented for removal of ureteral stent.Bactrim given for prophylaxis °05/03/21-Dermatology-David Kowalski,MD-Patient presented for actinic keratosis,no medication changes °04/26/21-Urology-Brian Sninsky,MD-Patient presented for initial visit for kidney stones.Left ureteroscopy, laser lithotripsy, stent placement this week °04/24/21-[facility]-Francisco Naveira,MD-Patient presented for follow up back pain, procedures, start Calcium carbonate 1500mg 1 tablet 2 times daily ,magnesium Oxide 500mg take 1 two times daily,Vitamin D3 5000units  take 1 daily  °04/17/21-Pulmonology-Herbon Fleming,MD-no data   ° °Chronic Care Management °Pharmacy Note ° °11/09/2021 °Name:  Kauri J Fairburn MRN:  [id number] DOB:  06/24/1936 ° °Summary: CCM F/U visit °-Pt was approved for Esbriet PAP through pulmonary office, she is appreciative of help with application °-Pt has just completed fecal transplant with GI and is not sure it is helping ° °Recommendations/Changes made from today's visit: °-No med changes ° °Plan: °-[facility] will call patient in 6 months for general adherence review °-Pharmacist follow up televisit scheduled for 1 year °-PCP physical 02/26/22 ° ° ° °Subjective: °[name] is an 85 y.o. year old female who is a primary patient of Clark, Katherine K, NP.  The CCM team was consulted for assistance with disease management and care coordination needs.   ° °Engaged with patient by telephone for follow up visit in response to provider referral for pharmacy case management and/or care coordination services.  ° °Consent to Services:  °The patient was given information about Chronic Care Management services, agreed to services, and gave verbal consent prior to initiation of services.  Please see initial visit note for detailed documentation.  ° °Patient Care Team: °Clark, Katherine K, NP as PCP - General (Internal Medicine) °Brasington, Chadwick, MD as Referring Physician (Ophthalmology) °Fleming, Herbon E, MD as Referring Physician (Specialist) °Callwood, Dwayne D, MD as Consulting Physician (Cardiology) °Cherry, Anika, MD as Referring Physician (Obstetrics and Gynecology) °Alves, Leonilde G, PA-C as Referring Physician (Physician Assistant) °Krasinski, Kevin, MD as Referring Physician (Orthopedic Surgery) °,  N, RPH as Pharmacist (Pharmacist) ° °Recent office visits: °10/16/21 PCP OV: c/o cough. Rx'd benzonatate, referred to [facility] IPF specialist. CXR with possible IPF progression, ordered CT. ° °02/23/21-PCP-Katherine Clark,NP-Patient presented for follow up anxiety and tremors.Labs  ordered(abnormal) start Atorvastatin 20mg take 1 daily  ° °02/16/21-Family Medicine-Telemedicine encounter for AWV. Educated on vaccines,screenings,no medication changes ° °Recent consult visits: °10/24/21 Dr Anna (GI): f/u Cdiff - presented for fecal transplant. Stop Dificid. °10/04/21 Dr Cherry (OB/GYN): Dx yeast dermatitis. Rx'd Fluconazole and nystatin-triamcinolone oint. °09/26/21 Dr Naveira (Pain mgmt): f/u chronic pain (back, shoulde). °07/31/21-[facility] -Francisco Naveira,MD-Patient presented for lumbar injection.Triamcinolone Acetonide 40mg. °07/24/21-Francisco Naveira,MD-Patient presented for follow up after a fall.Xrays reviewed and procedure scheduled.No medication changes °07/04/21 Dr Anna (GI) TE - Cdiff PCR positive, sent fidaxomicin x 10 days. °07/03/21-Francisco Naveira,MD-Telemedicine-Patient presented for follow up procedure. Future xrays ordered.No medication changes °07/02/21-Dermatology-David Kowalski,MD-Patient presented for follow up basil cell carcinoma excision.No medication changes  °06/19/21-Dermatology-David Kowalski,MD-Patient presented for post op.Discussed pathology,suture removal,no medication changes °06/14/21-Dermatology-Patient presented for procedure to remove skin lesion right side of neck. Started Bactroban 2% apply topically daily  °06/14/21-Cardiology-Dwayne Callwood,MD-Patient presented for follow up cardiomyopathy-reduce metoprolol to half patient fatigue sluggishness slightly bradycardic,have the patient follow-up with pulmonary for inhalers oxygen respiratory therapy °06/05/21-[facility]-Francisco Naveira,MD-Patient presented for Lumbar Facet injections.No medication changes. °05/22/21-Pulmonology-Herbon Fleming,MD-Patient presented for pulmonary function test.Follow up pulmonary fibrosis.Trial of Pulmicort BID, Trial of gabapentin 100 mg q 12 hrs and titrate up as indicated and tolerated °05/17/21-[facility]-Francisco  Naveira,MD-Patient presented for left shoulder injection-Methylprednisolone Acetate 80mg °05/10/21-Urology-Brian Sninsky,MD-Patient presented for removal of ureteral stent.Bactrim given for prophylaxis °05/03/21-Dermatology-David Kowalski,MD-Patient presented for actinic keratosis,no medication changes °04/26/21-Urology-Brian Sninsky,MD-Patient presented for initial visit for kidney stones.Left ureteroscopy, laser lithotripsy, stent placement this week °04/24/21-[facility]-Francisco Naveira,MD-Patient presented for follow up back pain, procedures, start Calcium carbonate 1500mg 1 tablet 2 times daily ,magnesium Oxide 500mg take 1 two times daily,Vitamin D3 5000units  take 1 daily  °04/17/21-Pulmonology-Herbon Fleming,MD-no data   ° °Chronic Care Management °Pharmacy Note ° °11/09/2021 °Name:  Kauri J Fairburn MRN:  [id number] DOB:  06/24/1936 ° °Summary: CCM F/U visit °-Pt was approved for Esbriet PAP through pulmonary office, she is appreciative of help with application °-Pt has just completed fecal transplant with GI and is not sure it is helping ° °Recommendations/Changes made from today's visit: °-No med changes ° °Plan: °-[facility] will call patient in 6 months for general adherence review °-Pharmacist follow up televisit scheduled for 1 year °-PCP physical 02/26/22 ° ° ° °Subjective: °[name] is an 85 y.o. year old female who is a primary patient of Clark, Katherine K, NP.  The CCM team was consulted for assistance with disease management and care coordination needs.   ° °Engaged with patient by telephone for follow up visit in response to provider referral for pharmacy case management and/or care coordination services.  ° °Consent to Services:  °The patient was given information about Chronic Care Management services, agreed to services, and gave verbal consent prior to initiation of services.  Please see initial visit note for detailed documentation.  ° °Patient Care Team: °Clark, Katherine K, NP as PCP - General (Internal Medicine) °Brasington, Chadwick, MD as Referring Physician (Ophthalmology) °Fleming, Herbon E, MD as Referring Physician (Specialist) °Callwood, Dwayne D, MD as Consulting Physician (Cardiology) °Cherry, Anika, MD as Referring Physician (Obstetrics and Gynecology) °Alves, Leonilde G, PA-C as Referring Physician (Physician Assistant) °Krasinski, Kevin, MD as Referring Physician (Orthopedic Surgery) °,  N, RPH as Pharmacist (Pharmacist) ° °Recent office visits: °10/16/21 PCP OV: c/o cough. Rx'd benzonatate, referred to [facility] IPF specialist. CXR with possible IPF progression, ordered CT. ° °02/23/21-PCP-Katherine Clark,NP-Patient presented for follow up anxiety and tremors.Labs  ordered(abnormal) start Atorvastatin 20mg take 1 daily  ° °02/16/21-Family Medicine-Telemedicine encounter for AWV. Educated on vaccines,screenings,no medication changes ° °Recent consult visits: °10/24/21 Dr Anna (GI): f/u Cdiff - presented for fecal transplant. Stop Dificid. °10/04/21 Dr Cherry (OB/GYN): Dx yeast dermatitis. Rx'd Fluconazole and nystatin-triamcinolone oint. °09/26/21 Dr Naveira (Pain mgmt): f/u chronic pain (back, shoulde). °07/31/21-[facility] -Francisco Naveira,MD-Patient presented for lumbar injection.Triamcinolone Acetonide 40mg. °07/24/21-Francisco Naveira,MD-Patient presented for follow up after a fall.Xrays reviewed and procedure scheduled.No medication changes °07/04/21 Dr Anna (GI) TE - Cdiff PCR positive, sent fidaxomicin x 10 days. °07/03/21-Francisco Naveira,MD-Telemedicine-Patient presented for follow up procedure. Future xrays ordered.No medication changes °07/02/21-Dermatology-David Kowalski,MD-Patient presented for follow up basil cell carcinoma excision.No medication changes  °06/19/21-Dermatology-David Kowalski,MD-Patient presented for post op.Discussed pathology,suture removal,no medication changes °06/14/21-Dermatology-Patient presented for procedure to remove skin lesion right side of neck. Started Bactroban 2% apply topically daily  °06/14/21-Cardiology-Dwayne Callwood,MD-Patient presented for follow up cardiomyopathy-reduce metoprolol to half patient fatigue sluggishness slightly bradycardic,have the patient follow-up with pulmonary for inhalers oxygen respiratory therapy °06/05/21-[facility]-Francisco Naveira,MD-Patient presented for Lumbar Facet injections.No medication changes. °05/22/21-Pulmonology-Herbon Fleming,MD-Patient presented for pulmonary function test.Follow up pulmonary fibrosis.Trial of Pulmicort BID, Trial of gabapentin 100 mg q 12 hrs and titrate up as indicated and tolerated °05/17/21-[facility]-Francisco  Naveira,MD-Patient presented for left shoulder injection-Methylprednisolone Acetate 80mg °05/10/21-Urology-Brian Sninsky,MD-Patient presented for removal of ureteral stent.Bactrim given for prophylaxis °05/03/21-Dermatology-David Kowalski,MD-Patient presented for actinic keratosis,no medication changes °04/26/21-Urology-Brian Sninsky,MD-Patient presented for initial visit for kidney stones.Left ureteroscopy, laser lithotripsy, stent placement this week °04/24/21-[facility]-Francisco Naveira,MD-Patient presented for follow up back pain, procedures, start Calcium carbonate 1500mg 1 tablet 2 times daily ,magnesium Oxide 500mg take 1 two times daily,Vitamin D3 5000units  take 1 daily  °04/17/21-Pulmonology-Herbon Fleming,MD-no data   ° °Chronic Care Management °Pharmacy Note ° °11/09/2021 °Name:  Kauri J Fairburn MRN:  [id number] DOB:  06/24/1936 ° °Summary: CCM F/U visit °-Pt was approved for Esbriet PAP through pulmonary office, she is appreciative of help with application °-Pt has just completed fecal transplant with GI and is not sure it is helping ° °Recommendations/Changes made from today's visit: °-No med changes ° °Plan: °-[facility] will call patient in 6 months for general adherence review °-Pharmacist follow up televisit scheduled for 1 year °-PCP physical 02/26/22 ° ° ° °Subjective: °[name] is an 85 y.o. year old female who is a primary patient of Clark, Katherine K, NP.  The CCM team was consulted for assistance with disease management and care coordination needs.   ° °Engaged with patient by telephone for follow up visit in response to provider referral for pharmacy case management and/or care coordination services.  ° °Consent to Services:  °The patient was given information about Chronic Care Management services, agreed to services, and gave verbal consent prior to initiation of services.  Please see initial visit note for detailed documentation.  ° °Patient Care Team: °Clark, Katherine K, NP as PCP - General (Internal Medicine) °Brasington, Chadwick, MD as Referring Physician (Ophthalmology) °Fleming, Herbon E, MD as Referring Physician (Specialist) °Callwood, Dwayne D, MD as Consulting Physician (Cardiology) °Cherry, Anika, MD as Referring Physician (Obstetrics and Gynecology) °Alves, Leonilde G, PA-C as Referring Physician (Physician Assistant) °Krasinski, Kevin, MD as Referring Physician (Orthopedic Surgery) °,  N, RPH as Pharmacist (Pharmacist) ° °Recent office visits: °10/16/21 PCP OV: c/o cough. Rx'd benzonatate, referred to [facility] IPF specialist. CXR with possible IPF progression, ordered CT. ° °02/23/21-PCP-Katherine Clark,NP-Patient presented for follow up anxiety and tremors.Labs  ordered(abnormal) start Atorvastatin 20mg take 1 daily  ° °02/16/21-Family Medicine-Telemedicine encounter for AWV. Educated on vaccines,screenings,no medication changes ° °Recent consult visits: °10/24/21 Dr Anna (GI): f/u Cdiff - presented for fecal transplant. Stop Dificid. °10/04/21 Dr Cherry (OB/GYN): Dx yeast dermatitis. Rx'd Fluconazole and nystatin-triamcinolone oint. °09/26/21 Dr Naveira (Pain mgmt): f/u chronic pain (back, shoulde). °07/31/21-[facility] -Francisco Naveira,MD-Patient presented for lumbar injection.Triamcinolone Acetonide 40mg. °07/24/21-Francisco Naveira,MD-Patient presented for follow up after a fall.Xrays reviewed and procedure scheduled.No medication changes °07/04/21 Dr Anna (GI) TE - Cdiff PCR positive, sent fidaxomicin x 10 days. °07/03/21-Francisco Naveira,MD-Telemedicine-Patient presented for follow up procedure. Future xrays ordered.No medication changes °07/02/21-Dermatology-David Kowalski,MD-Patient presented for follow up basil cell carcinoma excision.No medication changes  °06/19/21-Dermatology-David Kowalski,MD-Patient presented for post op.Discussed pathology,suture removal,no medication changes °06/14/21-Dermatology-Patient presented for procedure to remove skin lesion right side of neck. Started Bactroban 2% apply topically daily  °06/14/21-Cardiology-Dwayne Callwood,MD-Patient presented for follow up cardiomyopathy-reduce metoprolol to half patient fatigue sluggishness slightly bradycardic,have the patient follow-up with pulmonary for inhalers oxygen respiratory therapy °06/05/21-[facility]-Francisco Naveira,MD-Patient presented for Lumbar Facet injections.No medication changes. °05/22/21-Pulmonology-Herbon Fleming,MD-Patient presented for pulmonary function test.Follow up pulmonary fibrosis.Trial of Pulmicort BID, Trial of gabapentin 100 mg q 12 hrs and titrate up as indicated and tolerated °05/17/21-[facility]-Francisco  Naveira,MD-Patient presented for left shoulder injection-Methylprednisolone Acetate 80mg °05/10/21-Urology-Brian Sninsky,MD-Patient presented for removal of ureteral stent.Bactrim given for prophylaxis °05/03/21-Dermatology-David Kowalski,MD-Patient presented for actinic keratosis,no medication changes °04/26/21-Urology-Brian Sninsky,MD-Patient presented for initial visit for kidney stones.Left ureteroscopy, laser lithotripsy, stent placement this week °04/24/21-[facility]-Francisco Naveira,MD-Patient presented for follow up back pain, procedures, start Calcium carbonate 1500mg 1 tablet 2 times daily ,magnesium Oxide 500mg take 1 two times daily,Vitamin D3 5000units  take 1 daily  °04/17/21-Pulmonology-Herbon Fleming,MD-no data

## 2021-11-12 ENCOUNTER — Ambulatory Visit
Admission: RE | Admit: 2021-11-12 | Discharge: 2021-11-12 | Disposition: A | Discharge status: Home / Self Care | Payer: Medicare HMO | Source: Ambulatory Visit | Attending: Gastroenterology | Admitting: Gastroenterology

## 2021-11-12 ENCOUNTER — Other Ambulatory Visit: Payer: Self-pay | Admitting: Gastroenterology

## 2021-11-12 ENCOUNTER — Other Ambulatory Visit: Payer: Self-pay

## 2021-11-12 DIAGNOSIS — R1319 Other dysphagia: Secondary | ICD-10-CM | POA: Insufficient documentation

## 2021-11-12 DIAGNOSIS — R1310 Dysphagia, unspecified: Secondary | ICD-10-CM | POA: Diagnosis not present

## 2021-11-12 DIAGNOSIS — K224 Dyskinesia of esophagus: Secondary | ICD-10-CM | POA: Diagnosis not present

## 2021-11-14 DIAGNOSIS — M5416 Radiculopathy, lumbar region: Secondary | ICD-10-CM | POA: Diagnosis not present

## 2021-11-14 DIAGNOSIS — M25519 Pain in unspecified shoulder: Secondary | ICD-10-CM | POA: Diagnosis not present

## 2021-11-14 DIAGNOSIS — G894 Chronic pain syndrome: Secondary | ICD-10-CM | POA: Diagnosis not present

## 2021-11-14 DIAGNOSIS — M25551 Pain in right hip: Secondary | ICD-10-CM | POA: Diagnosis not present

## 2021-11-14 DIAGNOSIS — M47896 Other spondylosis, lumbar region: Secondary | ICD-10-CM | POA: Diagnosis not present

## 2021-11-14 DIAGNOSIS — Z79899 Other long term (current) drug therapy: Secondary | ICD-10-CM | POA: Diagnosis not present

## 2021-11-14 DIAGNOSIS — J449 Chronic obstructive pulmonary disease, unspecified: Secondary | ICD-10-CM | POA: Diagnosis not present

## 2021-11-16 ENCOUNTER — Encounter: Payer: Medicare HMO | Admitting: Obstetrics and Gynecology

## 2021-11-18 ENCOUNTER — Encounter: Payer: Self-pay | Admitting: Gastroenterology

## 2021-11-20 NOTE — Telephone Encounter (Signed)
All it shows is that the muscle of the esophagus doesn't squeeze well at times- does not show any blockage .  ?

## 2021-11-21 ENCOUNTER — Encounter: Payer: Self-pay | Admitting: Gastroenterology

## 2021-11-22 ENCOUNTER — Ambulatory Visit
Admission: RE | Admit: 2021-11-22 | Discharge: 2021-11-22 | Disposition: A | Discharge status: Home / Self Care | Payer: Medicare HMO | Source: Ambulatory Visit | Attending: Gastroenterology | Admitting: Gastroenterology

## 2021-11-22 ENCOUNTER — Encounter: Payer: Self-pay | Admitting: Gastroenterology

## 2021-11-22 ENCOUNTER — Encounter
Admission: RE | Disposition: A | Discharge status: Home / Self Care | Payer: Self-pay | Source: Ambulatory Visit | Attending: Gastroenterology

## 2021-11-22 ENCOUNTER — Ambulatory Visit: Payer: Medicare HMO | Admitting: Anesthesiology

## 2021-11-22 DIAGNOSIS — R69 Illness, unspecified: Secondary | ICD-10-CM | POA: Diagnosis not present

## 2021-11-22 DIAGNOSIS — E669 Obesity, unspecified: Secondary | ICD-10-CM | POA: Diagnosis not present

## 2021-11-22 DIAGNOSIS — M8580 Other specified disorders of bone density and structure, unspecified site: Secondary | ICD-10-CM | POA: Insufficient documentation

## 2021-11-22 DIAGNOSIS — J449 Chronic obstructive pulmonary disease, unspecified: Secondary | ICD-10-CM | POA: Insufficient documentation

## 2021-11-22 DIAGNOSIS — F419 Anxiety disorder, unspecified: Secondary | ICD-10-CM | POA: Diagnosis not present

## 2021-11-22 DIAGNOSIS — R1310 Dysphagia, unspecified: Secondary | ICD-10-CM | POA: Diagnosis not present

## 2021-11-22 DIAGNOSIS — Z9981 Dependence on supplemental oxygen: Secondary | ICD-10-CM | POA: Diagnosis not present

## 2021-11-22 DIAGNOSIS — Z6823 Body mass index (BMI) 23.0-23.9, adult: Secondary | ICD-10-CM | POA: Diagnosis not present

## 2021-11-22 DIAGNOSIS — I10 Essential (primary) hypertension: Secondary | ICD-10-CM | POA: Insufficient documentation

## 2021-11-22 DIAGNOSIS — R1319 Other dysphagia: Secondary | ICD-10-CM

## 2021-11-22 DIAGNOSIS — G4730 Sleep apnea, unspecified: Secondary | ICD-10-CM | POA: Diagnosis not present

## 2021-11-22 HISTORY — PX: ESOPHAGOGASTRODUODENOSCOPY (EGD) WITH PROPOFOL: SHX5813

## 2021-11-22 HISTORY — DX: Angina pectoris, unspecified: I20.9

## 2021-11-22 SURGERY — ESOPHAGOGASTRODUODENOSCOPY (EGD) WITH PROPOFOL
Anesthesia: General

## 2021-11-22 MED ORDER — PROPOFOL 10 MG/ML IV BOLUS
INTRAVENOUS | Status: DC | PRN
  Administered 2021-11-22: 30 mg via INTRAVENOUS
  Administered 2021-11-22: 20 mg via INTRAVENOUS

## 2021-11-22 MED ORDER — SODIUM CHLORIDE 0.9 % IV SOLN
INTRAVENOUS | Status: DC
  Administered 2021-11-22: 1000 mL via INTRAVENOUS

## 2021-11-22 MED ORDER — PROPOFOL 500 MG/50ML IV EMUL
INTRAVENOUS | Status: DC | PRN
  Administered 2021-11-22: 125 ug/kg/min via INTRAVENOUS

## 2021-11-22 MED ORDER — GLYCOPYRROLATE 0.2 MG/ML IJ SOLN
INTRAMUSCULAR | Status: DC | PRN
  Administered 2021-11-22: .2 mg via INTRAVENOUS

## 2021-11-22 MED ORDER — LIDOCAINE HCL (CARDIAC) PF 100 MG/5ML IV SOSY
PREFILLED_SYRINGE | INTRAVENOUS | Status: DC | PRN
Start: 2021-11-22 — End: 2021-11-22
  Administered 2021-11-22: 100 mg via INTRAVENOUS

## 2021-11-22 NOTE — Transfer of Care (Signed)
Immediate Anesthesia Transfer of Care Note ? ?Patient: [name] ? ?Procedure(s) Performed: ESOPHAGOGASTRODUODENOSCOPY (EGD) WITH PROPOFOL ? ?Patient Location: Endoscopy Unit ? ?Anesthesia Type:General ? ?Level of Consciousness: drowsy and patient cooperative ? ?Airway & Oxygen Therapy: Patient Spontanous Breathing and Patient connected to face mask oxygen ? ?Post-op Assessment: Report given to RN and Post -op Vital signs reviewed and stable ? ?Post vital signs: Reviewed and stable ? ?Last Vitals:  ?Vitals Value Taken Time  ?BP 144/74 11/22/21 1002  ?Temp 36.2 ?C 11/22/21 1001  ?Pulse 89 11/22/21 1005  ?Resp 22 11/22/21 1005  ?SpO2 100 % 11/22/21 1005  ?Vitals shown include unvalidated device data. ? ?Last Pain:  ?Vitals:  ? 11/22/21 1001  ?TempSrc: Temporal  ?PainSc: Asleep  ?   ? ?  ? ?Complications: No notable events documented. ?

## 2021-11-22 NOTE — Anesthesia Procedure Notes (Signed)
Procedure Name: General with mask airway ?Date/Time: 11/22/2021 9:59 AM ?Performed by: Kelton Pillar, CRNA ?Pre-anesthesia Checklist: Patient identified, Emergency Drugs available, Suction available and Patient being monitored ?Patient Re-evaluated:Patient Re-evaluated prior to induction ?Oxygen Delivery Method: Simple face mask ?Induction Type: IV induction ?Placement Confirmation: positive ETCO2 and CO2 detector ?Dental Injury: Teeth and Oropharynx as per pre-operative assessment  ? ? ? ? ?

## 2021-11-22 NOTE — H&P (Signed)
Wyline Mood, MD [address], [place], [place], [address] [address], [address], [place], [place], [address] Phone: [phone]  Fax: [phone]  Primary Care Physician:  Doreene Nest, NP   Pre-Procedure History & Physical: HPI:  KEYSTAL BRIDEWELL is a 86 y.o. female is here for an endoscopy    Past Medical History:  Diagnosis Date   Altered mental state    Anginal pain (HCC)    Anxiety    Basal cell carcinoma 05/03/2021   right neck postauricular - Excised 06/12/21   BMI 30.0-30.9,adult    Cellulitis    Chest pain, atypical    Compression fracture of L4 lumbar vertebra    Constipation    Cystitis    Cystocele    Diverticulosis    Fatigue    Fibrosis, idiopathic pulmonary (HCC)    Gastroenteritis    History of back surgery    History of herniated intervertebral disc    HTN (hypertension)    Hypercholesteremia    Hypocalcemia    Incontinence of urine    OA (osteoarthritis)    shoulder and back   Obesity    Osteopenia    Pneumonia    Shortness of breath    Sleep apnea    Squamous cell carcinoma of skin 07/16/2016   Right upper lateral eyebrow. SCCis.   Squamous cell carcinoma of skin 01/13/2018   Left temporal hairline. WD SCC with superficial infiltration.   Thrush    Vitamin D deficiency     Past Surgical History:  Procedure Laterality Date   BACK SURGERY     CATARACT EXTRACTION W/PHACO Left 04/24/2016   Procedure: CATARACT EXTRACTION PHACO AND INTRAOCULAR LENS PLACEMENT (IOC);  Surgeon: Lockie Mola, MD;  Location: [facility];  Service: Ophthalmology;  Laterality: Left;   CATARACT EXTRACTION W/PHACO Right 07/14/2017   Procedure: CATARACT EXTRACTION PHACO AND INTRAOCULAR LENS PLACEMENT (IOC)  RIGHT;  Surgeon: Lockie Mola, MD;  Location: [facility];  Service: Ophthalmology;  Laterality: Right;   CHOLECYSTECTOMY     CYSTOSCOPY/URETEROSCOPY/HOLMIUM LASER/STENT PLACEMENT Left 04/27/2021   Procedure:  CYSTOSCOPY/URETEROSCOPY/HOLMIUM LASER/STENT PLACEMENT;  Surgeon: Sondra Come, MD;  Location: [facility];  Service: Urology;  Laterality: Left;   ESOPHAGOGASTRODUODENOSCOPY (EGD) WITH PROPOFOL N/A 10/13/2018   Procedure: ESOPHAGOGASTRODUODENOSCOPY (EGD) WITH PROPOFOL;  Surgeon: Wyline Mood, MD;  Location: [facility] ENDOSCOPY;  Service: Gastroenterology;  Laterality: N/A;   EYE SURGERY     FLEXIBLE SIGMOIDOSCOPY N/A 10/13/2018   Procedure: FLEXIBLE SIGMOIDOSCOPY;  Surgeon: Wyline Mood, MD;  Location: [facility] ENDOSCOPY;  Service: Gastroenterology;  Laterality: N/A;   FLEXIBLE SIGMOIDOSCOPY N/A 09/09/2019   Procedure: FLEXIBLE SIGMOIDOSCOPY;  Surgeon: Wyline Mood, MD;  Location: [facility] END[facility]COPY;  Service: Gastroenterology;  Laterality: N/A;   FLEXIBLE SIGMOIDOSCOPY N/A 08/04/2020   Procedure: FLEXIBLE SIGMOIDOSCOPY;  Surgeon: Wyline Mood, MD;  Location: [facility] ENDOSCOPY;  Service: Gastroenterology;  Laterality: N/A;  Per Dr. Tobi Bastos, unsedated   FLEXIBLE SIGMOIDOSCOPY N/A 08/31/2021   Procedure: FLEXIBLE SIGMOIDOSCOPY;  Surgeon: Wyline Mood, MD;  Location: [facility];  Service: Gastroenterology;  Laterality: N/A;  no sedation   HIP ARTHROPLASTY Right 09/27/2020   Procedure: ARTHROPLASTY BIPOLAR HIP (HEMIARTHROPLASTY);  Surgeon: Kennedy Bucker, MD;  Location: [facility];  Service: Orthopedics;  Laterality: Right;   ROTATOR CUFF REPAIR Bilateral    left-12/2009; right-03/2009 dr.califf   TONSILLECTOMY     TOTAL SHOULDER REPLACEMENT Left     Prior to Admission medications   Medication Sig Start Date End Date Taking? Authorizing  Provider  atorvastatin (LIPITOR) 20 MG tablet Take 1 tablet (20 mg total) by mouth daily. For cholesterol. 03/02/21  Yes Doreene Nest, NP  Biotin 10 MG CAPS Take 1 capsule by mouth daily.   Yes [provider]  citalopram (CELEXA) 40 MG tablet Take 40 mg by mouth daily. 11/04/21  Yes [provider]  denosumab (PROLIA) 60 MG/ML SOSY injection Inject 60 mg into the  skin every 6 (six) months.   Yes [provider]  ESBRIET 267 MG TABS Take 267 mg by mouth 3 (three) times daily with meals. 02/25/17  Yes [provider]  famotidine (PEPCID) 20 MG tablet Take 1 tablet by mouth in the morning and at bedtime.   Yes [provider]  fluconazole (DIFLUCAN) 100 MG tablet Take 2 tablets on Day#1, then decrease to 1 tablet daily. 10/04/21  Yes Hildred Laser, MD  HYDROcodone bit-homatropine (HYCODAN) 5-1.5 MG/5ML syrup Take by mouth. 10/17/21  Yes [provider]  Magnesium 500 MG CAPS Take 1 capsule by mouth as needed. 10/18/21  Yes [provider]  Magnesium Oxide 500 MG CAPS Take 1 capsule (500 mg total) by mouth 2 (two) times daily. For pain. 10/18/21  Yes Doreene Nest, NP  metoprolol succinate (TOPROL-XL) 25 MG 24 hr tablet Take 25 mg by mouth every other day. 05/09/21  Yes [provider]  mupirocin ointment (BACTROBAN) 2 % Apply 1 application topically daily. With dressing changes 06/12/21  Yes Deirdre Evener, MD  nystatin cream (MYCOSTATIN) Apply 1 application topically 2 (two) times daily. 09/18/21  Yes Hildred Laser, MD  nystatin-triamcinolone ointment [facility]) Apply 1 application topically 2 (two) times daily. 10/04/21  Yes Hildred Laser, MD  OXYGEN Inhale 2 L into the lungs at bedtime.   Yes [provider]  Oxyquinoline-Sod Lauryl Sulf 0.025-0.01 % GEL Place 1 application. vaginally every 30 (thirty) days.   Yes [provider]  sulfaSALAzine (AZULFIDINE) 500 MG tablet Take 1 tablet (500 mg total) by mouth 2 (two) times daily. 07/09/21 01/05/22 Yes Wyline Mood, MD  vitamin B-12 (CYANOCOBALAMIN) 1000 MCG tablet Take 1,000 mcg by mouth every other day.   Yes [provider]  VOLTAREN 1 % GEL Apply 2 g topically in the morning, at noon, and at bedtime. Shoulders and back 12/23/17  Yes [provider]  budesonide (PULMICORT) 180 MCG/ACT inhaler Inhale 2 puffs into the lungs 2  (two) times daily.    [provider]  fidaxomicin (DIFICID) 200 MG TABS tablet Take 200 mg by mouth 2 (two) times daily. Patient not taking: Reported on 11/22/2021    [provider]  gabapentin (NEURONTIN) 100 MG capsule Take 100 mg by mouth 2 (two) times daily. Patient not taking: Reported on 11/22/2021 05/22/21   [provider]  REBYOTA 150 ML SUSP Place rectally. Patient not taking: Reported on 11/22/2021 10/18/21   [provider]    Allergies as of 11/05/2021   (No Known Allergies)    Family History  Problem Relation Age of Onset   COPD Mother    Cancer Brother        colon    Social History   Socioeconomic History   Marital status: Married    Spouse name: Jake Shark   Number of children: 2   Years of education: Not on file   Highest education level: Not on file  Occupational History   Not on file  Tobacco Use   Smoking status: Never   Smokeless tobacco:  Never  Vaping Use   Vaping Use: Never used  Substance and Sexual Activity   Alcohol use: No   Drug use: No   Sexual activity: Never    Birth control/protection: None  Other Topics Concern   Not on file  Social History Narrative   Not on file   Social Determinants of Health   Financial Resource Strain: Low Risk    Difficulty of Paying Living Expenses: Not hard at all  Food Insecurity: No Food Insecurity   Worried About Programme researcher, broadcasting/film/video in the Last Year: Never true   Ran Out of Food in the Last Year: Never true  Transportation Needs: No Transportation Needs   Lack of Transportation (Medical): No   Lack of Transportation (Non-Medical): No  Physical Activity: Inactive   Days of Exercise per Week: 0 days   Minutes of Exercise per Session: 0 min  Stress: Stress Concern Present   Feeling of Stress : To some extent  Social Connections: Not on file  Intimate Partner Violence: Not At Risk   Fear of Current or Ex-Partner: No   Emotionally Abused: No   Physically Abused: No    Sexually Abused: No    Review of Systems: See HPI, otherwise negative ROS  Physical Exam: BP (!) 159/69   Pulse 66   Temp 97.7 F (36.5 C) (Temporal)   Resp 16   Ht 5' (1.524 m)   Wt 55.4 kg   SpO2 100%   BMI 23.87 kg/m  General:   Alert,  pleasant and cooperative in NAD Head:  Normocephalic and atraumatic. Neck:  Supple; no masses or thyromegaly. Lungs:  Clear throughout to auscultation, normal respiratory effort.    Heart:  +S1, +S2, Regular rate and rhythm, No edema. Abdomen:  Soft, nontender and nondistended. Normal bowel sounds, without guarding, and without rebound.   Neurologic:  Alert and  oriented x4;  grossly normal neurologically.  Impression/Plan: MALETA LERMA is here for an endoscopy  to be performed for  evaluation of dysphagia    Risks, benefits, limitations, and alternatives regarding endoscopy have been reviewed with the patient.  Questions have been answered.  All parties agreeable.   Wyline Mood, MD  11/22/2021, 9:45 AM

## 2021-11-22 NOTE — Anesthesia Postprocedure Evaluation (Signed)
Anesthesia Post Note ? ?Patient: DEAUNNA OLARTE ? ?Procedure(s) Performed: ESOPHAGOGASTRODUODENOSCOPY (EGD) WITH PROPOFOL ? ?Patient location during evaluation: Endoscopy ?Anesthesia Type: General ?Level of consciousness: awake and alert ?Pain management: pain level controlled ?Vital Signs Assessment: post-procedure vital signs reviewed and stable ?Respiratory status: spontaneous breathing, nonlabored ventilation, respiratory function stable and patient connected to nasal cannula oxygen ?Cardiovascular status: blood pressure returned to baseline and stable ?Postop Assessment: no apparent nausea or vomiting ?Anesthetic complications: no ? ? ?No notable events documented. ? ? ?Last Vitals:  ?Vitals:  ? 11/22/21 1001 11/22/21 1021  ?BP: (!) 144/74   ?Pulse:    ?Resp:    ?Temp: (!) 36.2 ?C   ?SpO2:  100%  ?  ?Last Pain:  ?Vitals:  ? 11/22/21 1011  ?TempSrc:   ?PainSc: 0-No pain  ? ? ?  ?  ?  ?  ?  ?  ? ?Arita Miss ? ? ? ? ?

## 2021-11-22 NOTE — Op Note (Signed)
[facility] ?Gastroenterology ?Patient Name: [name] ?Procedure Date: 11/22/2021 9:40 AM ?MRN: [id number] ?Account #: [id number] ?Date of Birth: 04/24/1936 ?Admit Type: Outpatient ?Age: 86 ?Room: [facility] ENDO ROOM 3 ?Gender: Female ?Note Status: Finalized ?Instrument Name: Upper Endoscope [id number] ?Procedure:             Upper GI endoscopy ?Indications:           Dysphagia ?Providers:             [name] MD, MD ?Referring MD:          [name] (Referring MD) ?Medicines:             Monitored Anesthesia Care ?Complications:         No immediate complications. ?Procedure:             Pre-Anesthesia Assessment: ?                       - Prior to the procedure, a History and Physical was  ?                       performed, and patient medications, allergies and  ?                       sensitivities were reviewed. The patient's tolerance  ?                       of previous anesthesia was reviewed. ?                       - The risks and benefits of the procedure and the  ?                       sedation options and risks were discussed with the  ?                       patient. All questions were answered and informed  ?                       consent was obtained. ?                       - ASA Grade Assessment: II - A patient with mild  ?                       systemic disease. ?                       After obtaining informed consent, the endoscope was  ?                       passed under direct vision. Throughout the procedure,  ?                       the patient's blood pressure, pulse, and oxygen  ?                       saturations were monitored continuously. The  ?                       Endosonoscope was introduced through  the mouth, and  ?                       advanced to the third part of duodenum. The upper GI  ?                       endoscopy was accomplished with ease. The patient  ?                       tolerated the procedure well. ?Findings: ?     The examined  duodenum was normal. ?     The stomach was normal. ?     The cardia and gastric fundus were normal on retroflexion. ?     The examined esophagus was normal. Biopsies were taken with a cold  ?     forceps for histology. ?Impression:            - Normal examined duodenum. ?                       - Normal stomach. ?                       - Normal esophagus. Biopsied. ?Recommendation:        - Await pathology results. ?                       - Discharge patient to home (with escort). ?                       - Resume previous diet. ?                       - Continue present medications. ?                       - Return to my office as previously scheduled. ?Procedure Code(s):     --- Professional --- ?                       [phone], Esophagogastroduodenoscopy, flexible,  ?                       transoral; with biopsy, single or multiple ?Diagnosis Code(s):     --- Professional --- ?                       R13.10, Dysphagia, unspecified ?CPT copyright 2019 American Medical Association. All rights reserved. ?The codes documented in this report are preliminary and upon coder review may  ?be revised to meet current compliance requirements. ?[name], MD ?[name] MD, MD ?11/22/2021 10:00:08 AM ?This report has been signed electronically. ?Number of Addenda: 0 ?Note Initiated On: 11/22/2021 9:40 AM ?Estimated Blood Loss:  Estimated blood loss: none. ?     [facility] ?

## 2021-11-22 NOTE — Anesthesia Preprocedure Evaluation (Signed)
Anesthesia Evaluation  ?Patient identified by MRN, date of birth, ID band ?Patient awake ? ? ? ?Reviewed: ?Allergy & Precautions, H&P , NPO status , Patient's Chart, lab work & pertinent test results ? ?History of Anesthesia Complications ?Negative for: history of anesthetic complications ? ?Airway ?Mallampati: III ? ?TM Distance: <3 FB ?Neck ROM: limited ? ? ? Dental ? ?(+) Chipped, Poor Dentition, Dental Advidsory Given ?  ?Pulmonary ?shortness of breath and Long-Term Oxygen Therapy, sleep apnea , pneumonia, COPD,  oxygen dependent, neg recent URI,  ?  ? ?+ decreased breath sounds ? ? ? ? ? Cardiovascular ?Exercise Tolerance: Good ?hypertension, (-) angina(-) Past MI and (-) DOE (-) dysrhythmias (-) Valvular Problems/Murmurs ?Rhythm:Regular Rate:Normal ?- Systolic murmurs ? ?  ?Neuro/Psych ?PSYCHIATRIC DISORDERS Anxiety negative neurological ROS ?   ? GI/Hepatic ?negative GI ROS, Neg liver ROS, neg GERD  ,  ?Endo/Other  ?negative endocrine ROS ? Renal/GU ?negative Renal ROS  ?negative genitourinary ?  ?Musculoskeletal ? ?(+) Arthritis ,  ? Abdominal ?  ?Peds ? Hematology ?negative hematology ROS ?(+)   ?Anesthesia Other Findings ?Past Medical History: ?No date: Altered mental state ?No date: Anxiety ?No date: BMI 30.0-30.9,adult ?No date: Cellulitis ?No date: Chest pain, atypical ?No date: Compression fracture of L4 lumbar vertebra ?No date: Constipation ?No date: Cystitis ?No date: Cystocele ?No date: Diverticulosis ?No date: Fatigue ?No date: Fibrosis, idiopathic pulmonary ([place]) ?No date: Gastroenteritis ?No date: History of back surgery ?No date: History of herniated intervertebral disc ?No date: HTN (hypertension) ?No date: Hypercholesteremia ?No date: Hypocalcemia ?No date: Incontinence of urine ?No date: OA (osteoarthritis) ?    Comment:  shoulder and back ?No date: Obesity ?No date: Osteopenia ?No date: Pneumonia ?No date: Shortness of breath ?No date: Sleep apnea ?No date:  Thrush ?No date: Vitamin D deficiency ? ?Past Surgical History: ?No date: BACK SURGERY ?04/24/2016: CATARACT EXTRACTION W/PHACO; Left ?    Comment:  Procedure: CATARACT EXTRACTION PHACO AND INTRAOCULAR  ?             LENS PLACEMENT ([place]);  Surgeon: Leandrew Koyanagi, MD;  ?             Location: [place];  Service: Ophthalmology;   ?             Laterality: Left; ?07/14/2017: CATARACT EXTRACTION W/PHACO; Right ?    Comment:  Procedure: CATARACT EXTRACTION PHACO AND INTRAOCULAR  ?             LENS PLACEMENT (IOC)  RIGHT;  Surgeon: Wallace Going,  ?             Nila Nephew, MD;  Location: [place];  Service:  ?             Ophthalmology;  Laterality: Right; ?No date: CHOLECYSTECTOMY ?10/13/2018: ESOPHAGOGASTRODUODENOSCOPY (EGD) WITH PROPOFOL; N/A ?    Comment:  Procedure: ESOPHAGOGASTRODUODENOSCOPY (EGD) WITH  ?             PROPOFOL;  Surgeon: [name], MD;  Location: [facility]  ?             ENDOSCOPY;  Service: Gastroenterology;  Laterality: N/A; ?10/13/2018: FLEXIBLE SIGMOIDOSCOPY; N/A ?    Comment:  Procedure: FLEXIBLE SIGMOIDOSCOPY;  Surgeon: Vicente Males,  ?             Bailey Mech, MD;  Location: [place];  Service:  ?             Gastroenterology;  Laterality: N/A; ?No date: ROTATOR CUFF REPAIR;  Bilateral ?    Comment:  left-12/2009; right-03/2009 dr.califf ?No date: TONSILLECTOMY ?No date: TOTAL SHOULDER REPLACEMENT; Left ? ? ? ? Reproductive/Obstetrics ?negative OB ROS ? ?  ? ? ? ? ? ? ? ? ? ? ? ? ? ?  ?  ? ? ? ? ? ? ? ? ?Anesthesia Physical ? ?Anesthesia Plan ? ?ASA: 4 ? ?Anesthesia Plan: General  ? ?Post-op Pain Management: Minimal or no pain anticipated  ? ?Induction: Intravenous ? ?PONV Risk Score and Plan: 3 and Ondansetron, Treatment may vary due to age or medical condition, Propofol infusion and TIVA ? ?Airway Management Planned: Natural Airway ? ?Additional Equipment: None ? ?Intra-op Plan:  ? ?Post-operative Plan:  ? ?Informed Consent: I have reviewed the patients History and Physical, chart,  labs and discussed the procedure including the risks, benefits and alternatives for the proposed anesthesia with the patient or authorized representative who has indicated his/her understanding and acceptance.  ? ? ? ?Dental Advisory Given ? ?Plan Discussed with: Anesthesiologist, CRNA and Surgeon ? ?Anesthesia Plan Comments: (Patient consented for risks of anesthesia including but not limited to:  ?- adverse reactions to medications ?- risk of intubation if required ?- damage to teeth, lips or other oral mucosa ?- sore throat or hoarseness ?- Damage to heart, brain, lungs or loss of life ? ?Patient voiced understanding.)  ? ? ? ? ? ? ?Anesthesia Quick Evaluation ? ?

## 2021-11-23 ENCOUNTER — Encounter: Payer: Self-pay | Admitting: Gastroenterology

## 2021-11-23 LAB — SURGICAL PATHOLOGY

## 2021-11-25 ENCOUNTER — Encounter: Payer: Self-pay | Admitting: Gastroenterology

## 2021-11-26 DIAGNOSIS — M818 Other osteoporosis without current pathological fracture: Secondary | ICD-10-CM | POA: Diagnosis not present

## 2021-11-27 ENCOUNTER — Other Ambulatory Visit: Payer: Self-pay

## 2021-11-27 ENCOUNTER — Telehealth: Payer: Self-pay

## 2021-11-27 NOTE — Telephone Encounter (Signed)
CALLED PATIENT NO ANSWER LEFT VOICEMAIL FOR A CALL BACK ? ?

## 2021-11-28 ENCOUNTER — Encounter: Payer: Medicare HMO | Admitting: Obstetrics and Gynecology

## 2021-11-29 DIAGNOSIS — R29898 Other symptoms and signs involving the musculoskeletal system: Secondary | ICD-10-CM | POA: Diagnosis not present

## 2021-11-29 DIAGNOSIS — J84112 Idiopathic pulmonary fibrosis: Secondary | ICD-10-CM | POA: Diagnosis not present

## 2021-11-29 DIAGNOSIS — Z9981 Dependence on supplemental oxygen: Secondary | ICD-10-CM | POA: Diagnosis not present

## 2021-11-29 DIAGNOSIS — R0609 Other forms of dyspnea: Secondary | ICD-10-CM | POA: Diagnosis not present

## 2021-11-30 DIAGNOSIS — M810 Age-related osteoporosis without current pathological fracture: Secondary | ICD-10-CM | POA: Diagnosis not present

## 2021-11-30 DIAGNOSIS — E785 Hyperlipidemia, unspecified: Secondary | ICD-10-CM | POA: Diagnosis not present

## 2021-11-30 DIAGNOSIS — I429 Cardiomyopathy, unspecified: Secondary | ICD-10-CM | POA: Diagnosis not present

## 2021-11-30 DIAGNOSIS — I10 Essential (primary) hypertension: Secondary | ICD-10-CM

## 2021-11-30 DIAGNOSIS — J84112 Idiopathic pulmonary fibrosis: Secondary | ICD-10-CM

## 2021-12-03 ENCOUNTER — Other Ambulatory Visit: Payer: Self-pay | Admitting: Primary Care

## 2021-12-03 DIAGNOSIS — G8929 Other chronic pain: Secondary | ICD-10-CM

## 2021-12-05 ENCOUNTER — Ambulatory Visit: Payer: Self-pay | Admitting: Dermatology

## 2021-12-05 DIAGNOSIS — M47817 Spondylosis without myelopathy or radiculopathy, lumbosacral region: Secondary | ICD-10-CM | POA: Diagnosis not present

## 2021-12-09 ENCOUNTER — Encounter: Payer: Self-pay | Admitting: Gastroenterology

## 2021-12-09 DIAGNOSIS — A0472 Enterocolitis due to Clostridium difficile, not specified as recurrent: Secondary | ICD-10-CM

## 2021-12-10 NOTE — Telephone Encounter (Signed)
If still having diarrhea lets check stool for c diff and gi pcr and then decide on next ste[

## 2021-12-12 ENCOUNTER — Encounter: Payer: Medicare HMO | Admitting: Obstetrics and Gynecology

## 2021-12-13 DIAGNOSIS — A0472 Enterocolitis due to Clostridium difficile, not specified as recurrent: Secondary | ICD-10-CM | POA: Diagnosis not present

## 2021-12-16 ENCOUNTER — Encounter: Payer: Self-pay | Admitting: Gastroenterology

## 2021-12-16 LAB — GI PROFILE, STOOL, PCR

## 2021-12-16 LAB — CLOSTRIDIUM DIFFICILE BY PCR: Toxigenic C. Difficile by PCR: NEGATIVE

## 2021-12-17 NOTE — Progress Notes (Signed)
Inform stool is negative for infection the c diff has cleared- if having diarrhea suggest imodium and fiber supplement

## 2021-12-18 NOTE — Telephone Encounter (Signed)
Would need a video or office visit to discuss ongoing issues

## 2021-12-19 ENCOUNTER — Telehealth: Payer: Medicare HMO | Admitting: Gastroenterology

## 2021-12-19 DIAGNOSIS — K580 Irritable bowel syndrome with diarrhea: Secondary | ICD-10-CM | POA: Diagnosis not present

## 2021-12-19 DIAGNOSIS — Z8619 Personal history of other infectious and parasitic diseases: Secondary | ICD-10-CM | POA: Diagnosis not present

## 2021-12-19 DIAGNOSIS — K5190 Ulcerative colitis, unspecified, without complications: Secondary | ICD-10-CM

## 2021-12-19 NOTE — Progress Notes (Signed)
?  ?[name] , MD ?[address]  ?[address]  ?[place], [place] [address]  ?Main: [phone]  ?Fax: [phone] ? ? ?Primary Care Physician: [name], NP ? ?Virtual Visit via Video Note ? ?I connected with patient on 12/19/21 at  1:45 PM EDT by video and verified that I am speaking with the correct person using two identifiers. ?  ?I discussed the limitations, risks, security and privacy concerns of performing an evaluation and management service by video  and the availability of in person appointments. I also discussed with the patient that there may be a patient responsible charge related to this service. The patient expressed understanding and agreed to proceed. ? ?Location of Patient: Home ?Location of Provider: Home ?Persons involved: Patient and provider only ? ? ?History of Present Illness: ?Chief Complaint  ?Patient presents with  ? Ulcerative Colitis  ? ? ?HPI: [name] is a 86 y.o. female ? ?Summary of history : ?  ?Since approximately early 2021 she has been on treatment with sulfasalazine for ulcerative pancolitis.Has had a few episodes of recurrent C diff colitis.  She was seen in 2018 by [facility] clinic GI  in 08/2008 she had a colonoscopy as per the last GI note showed chronic colitis with focal cryptitis.  Focal active colitis.   Not sure why it was not felt that this patient had inflammatory bowel disease.  At her initial visit she had been on Meloxicam for about 2-3  Months, 2 tablets a day and no other NSAID's.  She also did have blood in her stool at that point of time. ?  ?10/13/2018: EGD: normal . Sigmoidoscopy Discontinuous areas of nonbleeding ulcerated mucosa with no stigmata of recent bleeding were present in the sigmoid colon, in thedescending colon and in the distal transverse colon. Biopsies were taken with a cold forceps for histology.Rectum was normal. Pathology report showed chronic active colitis and normal rectal biopsies. HSV and CMV negative.  ?  ?In  February 2021 when she came to see me she had stopped meloxicam and doing well.  Was not having any diarrhea. ?  ?09/09/2019: Sigmoidoscopy: I went all the way to her mid transverse colon.  No evidence of colitis was seen.  There was a single solitary ulcer in the proximal descending colon.  No bleeding was seen.  Biopsies were taken with forceps.  And it demonstrated chronic colitis with mild to moderate activity and small crypt abscess. ?  ?08/04/2020: Flexible sigmoidoscopy: The mucosa of the entire examined colon appeared normal diverticulosis noted.  Biopsies taken.  Showed focal mild active colitis in the left colon and the rectum showed no active colitis.  ?01/03/2021: CRP less than 1, vitamin D normal, B12 normal ?10/24/2020: Hemoglobin 12.8 g. ?  ?Treated in  07/03/2020 for C. difficile diarrhea.  on 07/11/2020, 03/03/2021  ?  ?08/31/2021: Flexible sigmoidoscopy  Normal mucosa was found in the left colon. Biopsies were taken with a cold forceps for histology. Biopsies showed mild chronic proctocolitis.  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 ? ?Interval history  11/06/2021-12/19/2021 ? ?11/12/2021: mild esophageal dysmotility  ? 11/22/2021: EGD: Normal : bx of esophagus normal  ?12/10/2021: Stool for PCR and  c diff negative for infection  ?  ?She states that since the stool transplant still having soft stools increased frequency no blood in the stools.  When she takes Imodium her symptoms resolved.  She was concerned if she can take Imodium long-term.  She  is taking 1 tablet of sulfasalazine at this point of time.  Denies any NSAID use. ? ? ?Current Outpatient Medications  ?Medication Sig Dispense Refill  ? atorvastatin (LIPITOR) 20 MG tablet Take 1 tablet (20 mg total) by mouth daily. For cholesterol. 90 tablet 3  ? Biotin 10 MG CAPS Take 1 capsule by mouth daily.    ? budesonide (PULMICORT) 180 MCG/ACT inhaler Inhale 2 puffs into the lungs 2 (two) times daily.    ? citalopram (CELEXA) 40 MG tablet Take 40 mg by  mouth daily.    ? denosumab (PROLIA) 60 MG/ML SOSY injection Inject 60 mg into the skin every 6 (six) months.    ? ESBRIET 267 MG TABS Take 267 mg by mouth 3 (three) times daily with meals.    ? famotidine (PEPCID) 20 MG tablet Take 1 tablet by mouth in the morning and at bedtime.    ? fluconazole (DIFLUCAN) 100 MG tablet Take 2 tablets on Day#1, then decrease to 1 tablet daily. 15 tablet 0  ? gabapentin (NEURONTIN) 100 MG capsule Take 100 mg by mouth 2 (two) times daily.    ? HYDROcodone bit-homatropine (HYCODAN) 5-1.5 MG/5ML syrup Take by mouth.    ? HYDROcodone-acetaminophen (NORCO/VICODIN) 5-325 MG tablet Take 1 tablet by mouth 3 (three) times daily as needed.    ? Magnesium 500 MG CAPS Take 1 capsule by mouth as needed.    ? Magnesium Oxide 500 MG CAPS Take 1 capsule (500 mg total) by mouth 2 (two) times daily. For pain. 180 capsule 0  ? metoprolol succinate (TOPROL-XL) 25 MG 24 hr tablet Take 25 mg by mouth every other day.    ? mupirocin ointment (BACTROBAN) 2 % Apply 1 application topically daily. With dressing changes 22 g 0  ? nystatin cream (MYCOSTATIN) Apply 1 application topically 2 (two) times daily. 30 g 1  ? nystatin-triamcinolone ointment (MYCOLOG) Apply 1 application topically 2 (two) times daily. 30 g 0  ? OXYGEN Inhale 2 L into the lungs at bedtime.    ? Oxyquinoline-Sod Lauryl Sulf 0.025-0.01 % GEL Place 1 application. vaginally every 30 (thirty) days.    ? sulfaSALAzine (AZULFIDINE) 500 MG tablet Take 1 tablet (500 mg total) by mouth 2 (two) times daily. 180 tablet 3  ? vitamin B-12 (CYANOCOBALAMIN) 1000 MCG tablet Take 1,000 mcg by mouth every other day.    ? VOLTAREN 1 % GEL Apply 2 g topically in the morning, at noon, and at bedtime. Shoulders and back  2  ? ?No current facility-administered medications for this visit.  ? ? ?Allergies as of 12/19/2021  ? (No Known Allergies)  ? ? ?Review of Systems:    ?All systems reviewed and negative except where noted in HPI.  ?General Appearance:     Alert, cooperative, no distress, appears stated age  ?Head:    Normocephalic, without obvious abnormality, atraumatic  ?Eyes:    PERRL, conjunctiva/corneas clear,  ?Ears:    Grossly normal hearing   ? ?Neurologic:  Grossly normal   ? ?Observations/Objective: ? ?Labs: ?CMP  ?   ?Component Value Date/Time  ? NA 139 04/21/2021 0409  ? NA 139 09/21/2020 1320  ? NA 139 02/09/2014 0500  ? K 4.4 04/21/2021 0409  ? K 3.2 (L) 02/09/2014 0500  ? CL 106 04/21/2021 0409  ? CL 106 02/09/2014 0500  ? CO2 28 04/21/2021 0409  ? CO2 27 02/09/2014 0500  ? GLUCOSE 126 (H) 04/21/2021 0409  ? GLUCOSE 85 02/09/2014 0500  ?  BUN 24 (H) 04/21/2021 0409  ? BUN 19 09/21/2020 1320  ? BUN 11 02/09/2014 0500  ? CREATININE 0.80 10/19/2021 1548  ? CREATININE 0.78 02/09/2014 0500  ? CALCIUM 9.2 04/21/2021 0409  ? CALCIUM 8.8 02/09/2014 0500  ? PROT 7.0 04/21/2021 0409  ? PROT 6.7 08/01/2020 1419  ? PROT 6.7 02/08/2014 1247  ? ALBUMIN 3.8 04/21/2021 0409  ? ALBUMIN 3.9 08/01/2020 1419  ? ALBUMIN 3.2 (L) 02/08/2014 1247  ? AST 36 04/21/2021 0409  ? AST 35 02/08/2014 1247  ? ALT 25 04/21/2021 0409  ? ALT 23 02/08/2014 1247  ? ALKPHOS 69 04/21/2021 0409  ? ALKPHOS 101 02/08/2014 1247  ? BILITOT 0.6 04/21/2021 0409  ? BILITOT 0.4 08/01/2020 1419  ? BILITOT 0.7 02/08/2014 1247  ? GFRNONAA >60 04/21/2021 0409  ? GFRNONAA >60 02/09/2014 0500  ? GFRAA 76 09/21/2020 1320  ? GFRAA >60 02/09/2014 0500  ? ?Lab Results  ?Component Value Date  ? WBC 15.8 (H) 04/21/2021  ? HGB 13.7 04/21/2021  ? HCT 41.3 04/21/2021  ? MCV 103.5 (H) 04/21/2021  ? PLT 263 04/21/2021  ? ? ?Imaging Studies: ?No results found. ? ?Assessment and Plan:  ? ?[name] is a 86 y.o. y/o female here to follow-up for chronic ulcerative colitis and has been doing well on sulfasalazine with no other GI symptoms.  Recent history of recurrent C. difficile colitis treated with vancomycin in 2021 and in November 2022 treated with Dificid and stool transplant in 10/2021 .  She is up-to-date  with her tetanus, influenza, DEXA scan and pneumococcal vaccination.  Health maintenance indicates she she has received Shingrix dose in 04/21/2020 and COVID vaccination.  ? ?. ?  ?Plan ?1.  Explained that sh

## 2021-12-20 ENCOUNTER — Other Ambulatory Visit: Payer: Medicare HMO

## 2021-12-20 ENCOUNTER — Telehealth: Payer: Medicare HMO | Admitting: Gastroenterology

## 2021-12-22 DIAGNOSIS — M19072 Primary osteoarthritis, left ankle and foot: Secondary | ICD-10-CM | POA: Diagnosis not present

## 2021-12-22 DIAGNOSIS — M19071 Primary osteoarthritis, right ankle and foot: Secondary | ICD-10-CM | POA: Diagnosis not present

## 2021-12-25 DIAGNOSIS — I700 Atherosclerosis of aorta: Secondary | ICD-10-CM | POA: Diagnosis not present

## 2021-12-25 DIAGNOSIS — I2584 Coronary atherosclerosis due to calcified coronary lesion: Secondary | ICD-10-CM | POA: Diagnosis not present

## 2021-12-25 DIAGNOSIS — I2510 Atherosclerotic heart disease of native coronary artery without angina pectoris: Secondary | ICD-10-CM | POA: Diagnosis not present

## 2021-12-25 DIAGNOSIS — I6523 Occlusion and stenosis of bilateral carotid arteries: Secondary | ICD-10-CM | POA: Diagnosis not present

## 2021-12-25 DIAGNOSIS — J449 Chronic obstructive pulmonary disease, unspecified: Secondary | ICD-10-CM | POA: Diagnosis not present

## 2021-12-25 DIAGNOSIS — G4733 Obstructive sleep apnea (adult) (pediatric): Secondary | ICD-10-CM | POA: Diagnosis not present

## 2021-12-25 DIAGNOSIS — J8410 Pulmonary fibrosis, unspecified: Secondary | ICD-10-CM | POA: Diagnosis not present

## 2021-12-25 DIAGNOSIS — E7800 Pure hypercholesterolemia, unspecified: Secondary | ICD-10-CM | POA: Diagnosis not present

## 2021-12-25 DIAGNOSIS — I10 Essential (primary) hypertension: Secondary | ICD-10-CM | POA: Diagnosis not present

## 2021-12-26 DIAGNOSIS — M5416 Radiculopathy, lumbar region: Secondary | ICD-10-CM | POA: Diagnosis not present

## 2021-12-26 DIAGNOSIS — M25551 Pain in right hip: Secondary | ICD-10-CM | POA: Diagnosis not present

## 2021-12-26 DIAGNOSIS — Z79891 Long term (current) use of opiate analgesic: Secondary | ICD-10-CM | POA: Diagnosis not present

## 2021-12-26 DIAGNOSIS — G894 Chronic pain syndrome: Secondary | ICD-10-CM | POA: Diagnosis not present

## 2021-12-26 DIAGNOSIS — M19072 Primary osteoarthritis, left ankle and foot: Secondary | ICD-10-CM | POA: Diagnosis not present

## 2021-12-26 DIAGNOSIS — M25519 Pain in unspecified shoulder: Secondary | ICD-10-CM | POA: Diagnosis not present

## 2021-12-26 DIAGNOSIS — M47896 Other spondylosis, lumbar region: Secondary | ICD-10-CM | POA: Diagnosis not present

## 2021-12-26 DIAGNOSIS — Z5181 Encounter for therapeutic drug level monitoring: Secondary | ICD-10-CM | POA: Diagnosis not present

## 2021-12-26 DIAGNOSIS — Z5189 Encounter for other specified aftercare: Secondary | ICD-10-CM | POA: Diagnosis not present

## 2021-12-26 DIAGNOSIS — M19071 Primary osteoarthritis, right ankle and foot: Secondary | ICD-10-CM | POA: Diagnosis not present

## 2021-12-26 DIAGNOSIS — Z79899 Other long term (current) drug therapy: Secondary | ICD-10-CM | POA: Diagnosis not present

## 2022-01-02 DIAGNOSIS — M79671 Pain in right foot: Secondary | ICD-10-CM | POA: Diagnosis not present

## 2022-01-02 DIAGNOSIS — M65871 Other synovitis and tenosynovitis, right ankle and foot: Secondary | ICD-10-CM | POA: Diagnosis not present

## 2022-01-02 DIAGNOSIS — M19072 Primary osteoarthritis, left ankle and foot: Secondary | ICD-10-CM | POA: Diagnosis not present

## 2022-01-02 DIAGNOSIS — M10072 Idiopathic gout, left ankle and foot: Secondary | ICD-10-CM | POA: Diagnosis not present

## 2022-01-02 DIAGNOSIS — M65872 Other synovitis and tenosynovitis, left ankle and foot: Secondary | ICD-10-CM | POA: Diagnosis not present

## 2022-01-02 DIAGNOSIS — M19071 Primary osteoarthritis, right ankle and foot: Secondary | ICD-10-CM | POA: Diagnosis not present

## 2022-01-02 DIAGNOSIS — M79672 Pain in left foot: Secondary | ICD-10-CM | POA: Diagnosis not present

## 2022-01-03 ENCOUNTER — Telehealth (INDEPENDENT_AMBULATORY_CARE_PROVIDER_SITE_OTHER): Payer: Medicare HMO | Admitting: Gastroenterology

## 2022-01-03 DIAGNOSIS — K580 Irritable bowel syndrome with diarrhea: Secondary | ICD-10-CM | POA: Diagnosis not present

## 2022-01-03 DIAGNOSIS — K5190 Ulcerative colitis, unspecified, without complications: Secondary | ICD-10-CM

## 2022-01-03 DIAGNOSIS — Z8619 Personal history of other infectious and parasitic diseases: Secondary | ICD-10-CM | POA: Diagnosis not present

## 2022-01-03 DIAGNOSIS — R109 Unspecified abdominal pain: Secondary | ICD-10-CM

## 2022-01-03 NOTE — Addendum Note (Signed)
Addended by: Wayna Chalet on: 01/03/2022 02:17 PM ? ? Modules accepted: Orders ? ?

## 2022-01-03 NOTE — Progress Notes (Signed)
?  ?[name] , MD ?[address]  ?[address]  ?[place], [place] [address]  ?Main: [phone]  ?Fax: [phone] ? ? ?Primary Care Physician: [name], NP ? ?Virtual Visit via Video Note ? ?I connected with patient on 01/03/22 at  2:15 PM EDT by video and verified that I am speaking with the correct person using two identifiers. ?  ?I discussed the limitations, risks, security and privacy concerns of performing an evaluation and management service by video  and the availability of in person appointments. I also discussed with the patient that there may be a patient responsible charge related to this service. The patient expressed understanding and agreed to proceed. ? ?Location of Patient: Home ?Location of Provider: Home ?Persons involved: Patient and provider only ? ? ?History of Present Illness: ?Chief Complaint  ?Patient presents with  ? Diarrhea  ? ? ?HPI: [name] is a 86 y.o. female ? ? ?Summary of history : ?  ?Since approximately early 2021 she has been on treatment with sulfasalazine for ulcerative pancolitis.Has had a few episodes of recurrent C diff colitis.  She was seen in 2018 by [facility] clinic GI  in 08/2008 she had a colonoscopy as per the last GI note showed chronic colitis with focal cryptitis.  Focal active colitis.   Not sure why it was not felt that this patient had inflammatory bowel disease.  At her initial visit she had been on Meloxicam for about 2-3  Months, 2 tablets a day and no other NSAID's.  She also did have blood in her stool at that point of time. ?  ?10/13/2018: EGD: normal . Sigmoidoscopy Discontinuous areas of nonbleeding ulcerated mucosa with no stigmata of recent bleeding were present in the sigmoid colon, in thedescending colon and in the distal transverse colon. Biopsies were taken with a cold forceps for histology.Rectum was normal. Pathology report showed chronic active colitis and normal rectal biopsies. HSV and CMV negative.  ?  ?In February  2021 when she came to see me she had stopped meloxicam and doing well.  Was not having any diarrhea. ?  ?09/09/2019: Sigmoidoscopy: I went all the way to her mid transverse colon.  No evidence of colitis was seen.  There was a single solitary ulcer in the proximal descending colon.  No bleeding was seen.  Biopsies were taken with forceps.  And it demonstrated chronic colitis with mild to moderate activity and small crypt abscess. ?  ?08/04/2020: Flexible sigmoidoscopy: The mucosa of the entire examined colon appeared normal diverticulosis noted.  Biopsies taken.  Showed focal mild active colitis in the left colon and the rectum showed no active colitis.  ?01/03/2021: CRP less than 1, vitamin D normal, B12 normal ?10/24/2020: Hemoglobin 12.8 g. ?  ?Treated in  07/03/2020 for C. difficile diarrhea.  on 07/11/2020, 03/03/2021,  10/24/2021: S/p Stool transplant for c diff   ?  ?08/31/2021: Flexible sigmoidoscopy  Normal mucosa was found in the left colon. Biopsies were taken with a cold forceps for histology. Biopsies showed mild chronic proctocolitis.  ?  ?11/12/2021: mild esophageal dysmotility  ? 11/22/2021: EGD: Normal : bx of esophagus normal  ?12/10/2021: Stool for PCR and  c diff negative for infection  ?  ?  ?  ?Interval history  12/19/2021-01/03/2022 ? ?She took 4 mg of Imodium which backed her off too much and stopped taking it and then had loose stools every time she had a bowel movement and also has some abdominal pain  preceding her bowel movements. ? ? ?Current Outpatient Medications  ?Medication Sig Dispense Refill  ? atorvastatin (LIPITOR) 20 MG tablet Take 1 tablet (20 mg total) by mouth daily. For cholesterol. 90 tablet 3  ? Biotin 10 MG CAPS Take 1 capsule by mouth daily.    ? budesonide (PULMICORT) 180 MCG/ACT inhaler Inhale 2 puffs into the lungs 2 (two) times daily.    ? citalopram (CELEXA) 40 MG tablet Take 40 mg by mouth daily.    ? denosumab (PROLIA) 60 MG/ML SOSY injection Inject 60 mg into the skin every 6  (six) months.    ? ESBRIET 267 MG TABS Take 267 mg by mouth 3 (three) times daily with meals.    ? famotidine (PEPCID) 20 MG tablet Take 1 tablet by mouth in the morning and at bedtime.    ? fluconazole (DIFLUCAN) 100 MG tablet Take 2 tablets on Day#1, then decrease to 1 tablet daily. 15 tablet 0  ? gabapentin (NEURONTIN) 100 MG capsule Take 100 mg by mouth 2 (two) times daily.    ? HYDROcodone bit-homatropine (HYCODAN) 5-1.5 MG/5ML syrup Take by mouth.    ? HYDROcodone-acetaminophen (NORCO/VICODIN) 5-325 MG tablet Take 1 tablet by mouth 3 (three) times daily as needed.    ? Magnesium 500 MG CAPS Take 1 capsule by mouth as needed.    ? Magnesium Oxide 500 MG CAPS Take 1 capsule (500 mg total) by mouth 2 (two) times daily. For pain. 180 capsule 0  ? metoprolol succinate (TOPROL-XL) 25 MG 24 hr tablet Take 25 mg by mouth every other day.    ? mupirocin ointment (BACTROBAN) 2 % Apply 1 application topically daily. With dressing changes 22 g 0  ? nystatin cream (MYCOSTATIN) Apply 1 application topically 2 (two) times daily. 30 g 1  ? nystatin-triamcinolone ointment (MYCOLOG) Apply 1 application topically 2 (two) times daily. 30 g 0  ? OXYGEN Inhale 2 L into the lungs at bedtime.    ? Oxyquinoline-Sod Lauryl Sulf 0.025-0.01 % GEL Place 1 application. vaginally every 30 (thirty) days.    ? sulfaSALAzine (AZULFIDINE) 500 MG tablet Take 1 tablet (500 mg total) by mouth 2 (two) times daily. 180 tablet 3  ? vitamin B-12 (CYANOCOBALAMIN) 1000 MCG tablet Take 1,000 mcg by mouth every other day.    ? VOLTAREN 1 % GEL Apply 2 g topically in the morning, at noon, and at bedtime. Shoulders and back  2  ? ?No current facility-administered medications for this visit.  ? ? ?Allergies as of 01/03/2022  ? (No Known Allergies)  ? ? ?Review of Systems:    ?All systems reviewed and negative except where noted in HPI.  ?General Appearance:    Alert, cooperative, no distress, appears stated age  ?Head:    Normocephalic, without obvious  abnormality, atraumatic  ?Eyes:    PERRL, conjunctiva/corneas clear,  ?Ears:    Grossly normal hearing   ? ?Neurologic:  Grossly normal   ? ?Observations/Objective: ? ?Labs: ?CMP  ?   ?Component Value Date/Time  ? NA 139 04/21/2021 0409  ? NA 139 09/21/2020 1320  ? NA 139 02/09/2014 0500  ? K 4.4 04/21/2021 0409  ? K 3.2 (L) 02/09/2014 0500  ? CL 106 04/21/2021 0409  ? CL 106 02/09/2014 0500  ? CO2 28 04/21/2021 0409  ? CO2 27 02/09/2014 0500  ? GLUCOSE 126 (H) 04/21/2021 0409  ? GLUCOSE 85 02/09/2014 0500  ? BUN 24 (H) 04/21/2021 0409  ? BUN 19 09/21/2020 1320  ?  BUN 11 02/09/2014 0500  ? CREATININE 0.80 10/19/2021 1548  ? CREATININE 0.78 02/09/2014 0500  ? CALCIUM 9.2 04/21/2021 0409  ? CALCIUM 8.8 02/09/2014 0500  ? PROT 7.0 04/21/2021 0409  ? PROT 6.7 08/01/2020 1419  ? PROT 6.7 02/08/2014 1247  ? ALBUMIN 3.8 04/21/2021 0409  ? ALBUMIN 3.9 08/01/2020 1419  ? ALBUMIN 3.2 (L) 02/08/2014 1247  ? AST 36 04/21/2021 0409  ? AST 35 02/08/2014 1247  ? ALT 25 04/21/2021 0409  ? ALT 23 02/08/2014 1247  ? ALKPHOS 69 04/21/2021 0409  ? ALKPHOS 101 02/08/2014 1247  ? BILITOT 0.6 04/21/2021 0409  ? BILITOT 0.4 08/01/2020 1419  ? BILITOT 0.7 02/08/2014 1247  ? GFRNONAA >60 04/21/2021 0409  ? GFRNONAA >60 02/09/2014 0500  ? GFRAA 76 09/21/2020 1320  ? GFRAA >60 02/09/2014 0500  ? ?Lab Results  ?Component Value Date  ? WBC 15.8 (H) 04/21/2021  ? HGB 13.7 04/21/2021  ? HCT 41.3 04/21/2021  ? MCV 103.5 (H) 04/21/2021  ? PLT 263 04/21/2021  ? ? ?Imaging Studies: ?No results found. ? ?Assessment and Plan:  ? ?[name] is a 87 y.o. y/o female here to follow-up for chronic ulcerative colitis and has been doing well on sulfasalazine with no other GI symptoms.  Recent history of recurrent C. difficile colitis treated with vancomycin in 2021 and in November 2022 treated with Dificid and stool transplant in 10/2021 .  She is up-to-date with her tetanus, influenza, DEXA scan and pneumococcal vaccination.  Health maintenance  indicates she she has received Shingrix dose in 04/21/2020 and COVID vaccination.  ?  ?. ?  ?Plan ?1.  Her symptoms are very suggestive of IBS.  Likely postinfectious.  Since 4 mg of Imodium was too much and caused her

## 2022-01-08 ENCOUNTER — Telehealth: Payer: Self-pay

## 2022-01-08 NOTE — Telephone Encounter (Signed)
CT Scan was rescheduled for 01/17/2022. Thanks. ?

## 2022-01-08 NOTE — Telephone Encounter (Signed)
Patient CT scan is schedule for 01/10/2022 but her insurance still has not approved the scan. Can this be reschedule to another day  ?

## 2022-01-10 ENCOUNTER — Other Ambulatory Visit: Payer: Medicare HMO

## 2022-01-17 ENCOUNTER — Other Ambulatory Visit: Payer: Self-pay | Admitting: Gastroenterology

## 2022-01-17 ENCOUNTER — Ambulatory Visit
Admission: RE | Admit: 2022-01-17 | Discharge: 2022-01-17 | Disposition: A | Discharge status: Home / Self Care | Payer: Medicare HMO | Source: Ambulatory Visit | Attending: Gastroenterology | Admitting: Gastroenterology

## 2022-01-17 DIAGNOSIS — N200 Calculus of kidney: Secondary | ICD-10-CM | POA: Diagnosis not present

## 2022-01-17 DIAGNOSIS — R109 Unspecified abdominal pain: Secondary | ICD-10-CM | POA: Diagnosis not present

## 2022-01-17 DIAGNOSIS — K5730 Diverticulosis of large intestine without perforation or abscess without bleeding: Secondary | ICD-10-CM | POA: Diagnosis not present

## 2022-01-21 NOTE — Progress Notes (Signed)
No acute abnormality

## 2022-01-23 ENCOUNTER — Encounter: Payer: Self-pay | Admitting: Gastroenterology

## 2022-01-28 ENCOUNTER — Encounter: Payer: Self-pay | Admitting: Gastroenterology

## 2022-01-30 ENCOUNTER — Other Ambulatory Visit: Payer: Self-pay

## 2022-01-30 DIAGNOSIS — M47817 Spondylosis without myelopathy or radiculopathy, lumbosacral region: Secondary | ICD-10-CM | POA: Diagnosis not present

## 2022-01-30 DIAGNOSIS — K580 Irritable bowel syndrome with diarrhea: Secondary | ICD-10-CM

## 2022-01-30 DIAGNOSIS — R197 Diarrhea, unspecified: Secondary | ICD-10-CM

## 2022-01-30 DIAGNOSIS — K5190 Ulcerative colitis, unspecified, without complications: Secondary | ICD-10-CM

## 2022-01-31 ENCOUNTER — Encounter: Payer: Self-pay | Admitting: Gastroenterology

## 2022-02-01 ENCOUNTER — Ambulatory Visit: Payer: Medicare HMO | Admitting: Registered Nurse

## 2022-02-01 ENCOUNTER — Telehealth: Payer: Self-pay

## 2022-02-01 ENCOUNTER — Encounter
Admission: RE | Disposition: A | Discharge status: Home / Self Care | Payer: Self-pay | Source: Home / Self Care | Attending: Gastroenterology

## 2022-02-01 ENCOUNTER — Encounter: Payer: Self-pay | Admitting: Gastroenterology

## 2022-02-01 ENCOUNTER — Other Ambulatory Visit: Payer: Self-pay | Admitting: Gastroenterology

## 2022-02-01 ENCOUNTER — Other Ambulatory Visit: Payer: Self-pay

## 2022-02-01 ENCOUNTER — Ambulatory Visit
Admission: RE | Admit: 2022-02-01 | Discharge: 2022-02-01 | Disposition: A | Discharge status: Home / Self Care | Payer: Medicare HMO | Attending: Gastroenterology | Admitting: Gastroenterology

## 2022-02-01 DIAGNOSIS — F419 Anxiety disorder, unspecified: Secondary | ICD-10-CM | POA: Diagnosis not present

## 2022-02-01 DIAGNOSIS — R062 Wheezing: Secondary | ICD-10-CM | POA: Insufficient documentation

## 2022-02-01 DIAGNOSIS — R0602 Shortness of breath: Secondary | ICD-10-CM | POA: Insufficient documentation

## 2022-02-01 DIAGNOSIS — M1990 Unspecified osteoarthritis, unspecified site: Secondary | ICD-10-CM | POA: Diagnosis not present

## 2022-02-01 DIAGNOSIS — K6289 Other specified diseases of anus and rectum: Secondary | ICD-10-CM | POA: Diagnosis not present

## 2022-02-01 DIAGNOSIS — R197 Diarrhea, unspecified: Secondary | ICD-10-CM

## 2022-02-01 DIAGNOSIS — R0609 Other forms of dyspnea: Secondary | ICD-10-CM | POA: Diagnosis not present

## 2022-02-01 DIAGNOSIS — E7800 Pure hypercholesterolemia, unspecified: Secondary | ICD-10-CM | POA: Diagnosis not present

## 2022-02-01 DIAGNOSIS — R109 Unspecified abdominal pain: Secondary | ICD-10-CM | POA: Diagnosis not present

## 2022-02-01 DIAGNOSIS — I10 Essential (primary) hypertension: Secondary | ICD-10-CM | POA: Diagnosis not present

## 2022-02-01 DIAGNOSIS — K5190 Ulcerative colitis, unspecified, without complications: Secondary | ICD-10-CM | POA: Diagnosis not present

## 2022-02-01 DIAGNOSIS — R69 Illness, unspecified: Secondary | ICD-10-CM | POA: Diagnosis not present

## 2022-02-01 DIAGNOSIS — J84112 Idiopathic pulmonary fibrosis: Secondary | ICD-10-CM | POA: Diagnosis not present

## 2022-02-01 DIAGNOSIS — K529 Noninfective gastroenteritis and colitis, unspecified: Secondary | ICD-10-CM | POA: Diagnosis not present

## 2022-02-01 DIAGNOSIS — K580 Irritable bowel syndrome with diarrhea: Secondary | ICD-10-CM

## 2022-02-01 HISTORY — PX: COLONOSCOPY WITH PROPOFOL: SHX5780

## 2022-02-01 SURGERY — COLONOSCOPY WITH PROPOFOL
Anesthesia: General

## 2022-02-01 MED ORDER — STERILE WATER FOR IRRIGATION IR SOLN
Status: DC | PRN
  Administered 2022-02-01: 60 mL

## 2022-02-01 MED ORDER — PROPOFOL 10 MG/ML IV BOLUS
INTRAVENOUS | Status: DC | PRN
  Administered 2022-02-01: 50 mg via INTRAVENOUS

## 2022-02-01 MED ORDER — LIDOCAINE HCL (CARDIAC) PF 100 MG/5ML IV SOSY
PREFILLED_SYRINGE | INTRAVENOUS | Status: DC | PRN
  Administered 2022-02-01: 75 mg via INTRAVENOUS

## 2022-02-01 MED ORDER — SODIUM CHLORIDE 0.9 % IV SOLN
INTRAVENOUS | Status: DC
  Administered 2022-02-01: 1000 mL via INTRAVENOUS

## 2022-02-01 MED ORDER — DEXMEDETOMIDINE HCL IN NACL 80 MCG/20ML IV SOLN
INTRAVENOUS | Status: AC
  Filled 2022-02-01: qty 20

## 2022-02-01 MED ORDER — BUDESONIDE 3 MG PO CPEP: 9.0000 mg | ORAL_CAPSULE | Freq: Every day | ORAL | 0 refills | Status: DC

## 2022-02-01 MED ORDER — PROPOFOL 500 MG/50ML IV EMUL
INTRAVENOUS | Status: DC | PRN
  Administered 2022-02-01: 125 ug/kg/min via INTRAVENOUS

## 2022-02-01 MED ORDER — PROPOFOL 1000 MG/100ML IV EMUL
INTRAVENOUS | Status: AC
  Filled 2022-02-01: qty 200

## 2022-02-01 MED ORDER — LIDOCAINE HCL (PF) 2 % IJ SOLN
INTRAMUSCULAR | Status: AC
  Filled 2022-02-01: qty 35

## 2022-02-01 NOTE — Anesthesia Postprocedure Evaluation (Addendum)
Anesthesia Post Note  Patient: [name]  Procedure(s) Performed: COLONOSCOPY WITH PROPOFOL  Patient location during evaluation: Endoscopy Anesthesia Type: General Level of consciousness: awake and alert Pain management: pain level controlled Vital Signs Assessment: post-procedure vital signs reviewed and stable Respiratory status: spontaneous breathing, nonlabored ventilation, respiratory function stable and patient connected to nasal cannula oxygen Cardiovascular status: blood pressure returned to baseline and stable Postop Assessment: no apparent nausea or vomiting Anesthetic complications: no   No notable events documented.   Last Vitals:  Vitals:   02/01/22 0820 02/01/22 0840  BP: (!) 106/59 (!) 148/78  Pulse:    Resp: (!) 26   Temp:    SpO2:  100%    Last Pain:  Vitals:   02/01/22 0840  TempSrc:   PainSc: 0-No pain                 Iran Ouch

## 2022-02-01 NOTE — Telephone Encounter (Signed)
Patient has been informed per Dr. Vicente Males her IBD has gotten worse. Dr. Vicente Males has requested her to start Budesonide 9 mg daily for 8 weeks.  Rx has been sent to pharmacy. Advised patient to stop taking Sulfasalazine. MR Enterogram Abd/Pelvis has been ordered and scheduled for 02/14/22 at [facility] arrival time 8:30am. Patient has been asked to come by the office on Monday afternoon between 1pm-4pm to have labs drawn.  Follow up office visit scheduled for 02/18/22 to discuss IBD Treatment.  Pt verbalized understanding of advice given.  Thanks, [place], [place]

## 2022-02-01 NOTE — Transfer of Care (Signed)
Immediate Anesthesia Transfer of Care Note  Patient: [name]  Procedure(s) Performed: COLONOSCOPY WITH PROPOFOL  Patient Location: Endoscopy Unit  Anesthesia Type:General  Level of Consciousness: drowsy  Airway & Oxygen Therapy: Patient Spontanous Breathing and Patient connected to nasal cannula oxygen  Post-op Assessment: Report given to RN and Post -op Vital signs reviewed and stable  Post vital signs: Reviewed and stable  Last Vitals:  Vitals Value Taken Time  BP 93/55 02/01/22 0811  Temp 35.8 C 02/01/22 0810  Pulse 80 02/01/22 0811  Resp 24 02/01/22 0811  SpO2 99 % 02/01/22 0811  Vitals shown include unvalidated device data.  Last Pain:  Vitals:   02/01/22 0810  TempSrc: Temporal  PainSc:       Patients Stated Pain Goal: 7 (67/12/45 8099)  Complications: No notable events documented.

## 2022-02-01 NOTE — Addendum Note (Signed)
Addendum  created 02/01/22 1259 by Iran Ouch, MD   Clinical Note Signed

## 2022-02-01 NOTE — Op Note (Signed)
[facility]astroenterology Patient Name: [name] Procedure Date: 02/01/2022 7:33 AM MRN: [id number] Account #: [id number] Date of Birth: [birth year] Admit Type: Outpatient Age: 86 Room: [facility] ENDO ROOM 4 Gender: Female Note Status: Finalized Instrument Name: Jasper Riling [id number] Procedure:             Colonoscopy Indications:           Chronic diarrhea Providers:             [name] MD, MD Referring MD:          [name], NP (Referring MD) Medicines:             Monitored Anesthesia Care Complications:         No immediate complications. Procedure:             Pre-Anesthesia Assessment:                        - Prior to the procedure, a History and Physical was                         performed, and patient medications, allergies and                         sensitivities were reviewed. The patient's tolerance                         of previous anesthesia was reviewed.                        - The risks and benefits of the procedure and the                         sedation options and risks were discussed with the                         patient. All questions were answered and informed                         consent was obtained.                        - ASA Grade Assessment: II - A patient with mild                         systemic disease.                        After obtaining informed consent, the colonoscope was                         passed under direct vision. Throughout the procedure,                         the patient's blood pressure, pulse, and oxygen                         saturations were monitored continuously. The                         Colonoscope was introduced through the  anus and                         advanced to the the cecum, identified by the                         appendiceal orifice. The colonoscopy was performed                         with ease. The patient tolerated the procedure well.                          The quality of the bowel preparation was adequate. Findings:      The perianal and digital rectal examinations were normal.      Patchy moderate mucosal changes characterized by mucus and serpentine       ulcerations were found in the entire colon. Rectum appeared spared,       unable to intubate terminal ileum Biopsies were taken with a cold       forceps for histology. Impression:            - Patchy moderate mucosal changes were found in the                         entire examined colon secondary to colitis. Biopsied. Recommendation:        - Discharge patient to home (with escort).                        - Resume previous diet.                        - Continue present medications.                        - Await pathology results.                        - Return to my office in 2 weeks. Procedure Code(s):     --- Professional ---                        [id number], Colonoscopy, flexible; with biopsy, single or                         multiple Diagnosis Code(s):     --- Professional ---                        K52.9, Noninfective gastroenteritis and colitis,                         unspecified CPT copyright 2019 [employer]. All rights reserved. The codes documented in this report are preliminary and upon coder review may  be revised to meet current compliance requirements. [name], MD [name] MD, MD 02/01/2022 8:09:45 AM This report has been signed electronically. Number of Addenda: 0 Note Initiated On: 02/01/2022 7:33 AM Scope Withdrawal Time: 0 hours 9 minutes 37 seconds  Total Procedure Duration: 0 hours 13 minutes 6 seconds  Estimated Blood Loss:  Estimated blood loss: none.      [facility]

## 2022-02-01 NOTE — Anesthesia Preprocedure Evaluation (Addendum)
Anesthesia Evaluation  Patient identified by MRN, date of birth, ID band Patient awake    Reviewed: Allergy & Precautions, H&P , NPO status , Patient's Chart, lab work & pertinent test results, reviewed documented beta blocker date and time   History of Anesthesia Complications Negative for: history of anesthetic complications  Airway Mallampati: II  TM Distance: <3 FB Neck ROM: limited    Dental no notable dental hx. (+) Dental Advidsory Given   Pulmonary shortness of breath, with exertion, at rest and Long-Term Oxygen Therapy, neg recent URI,  Fibrosis, idiopathic pulmonary - oxygen    + decreased breath sounds+ wheezing      Cardiovascular Exercise Tolerance: Good hypertension, Pt. on home beta blockers (-) angina+ DOE  (-) Past MI (-) dysrhythmias (-) Valvular Problems/Murmurs Rhythm:Regular Rate:Normal - Systolic murmurs    Neuro/Psych PSYCHIATRIC DISORDERS Anxiety  Neuromuscular disease    GI/Hepatic Neg liver ROS, neg GERD  ,ulcerative colitis   Endo/Other  negative endocrine ROS  Renal/GU negative Renal ROS  negative genitourinary   Musculoskeletal  (+) Arthritis ,   Abdominal Normal abdominal exam  (+)   Peds  Hematology negative hematology ROS (+)   Anesthesia Other Findings Past Medical History: No date: Altered mental state No date: Anxiety No date: BMI 30.0-30.9,adult No date: Cellulitis No date: Chest pain, atypical No date: Compression fracture of L4 lumbar vertebra No date: Constipation No date: Cystitis No date: Cystocele No date: Diverticulosis No date: Fatigue No date: Fibrosis, idiopathic pulmonary (HCC) No date: Gastroenteritis No date: History of back surgery No date: History of herniated intervertebral disc No date: HTN (hypertension) No date: Hypercholesteremia No date: Hypocalcemia No date: Incontinence of urine No date: OA (osteoarthritis)     Comment:  shoulder and back No  date: Obesity No date: Osteopenia No date: Pneumonia No date: Shortness of breath No date: Sleep apnea No date: Thrush No date: Vitamin D deficiency  Past Surgical History: No date: BACK SURGERY 04/24/2016: CATARACT EXTRACTION W/PHACO; Left     Comment:  Procedure: CATARACT EXTRACTION PHACO AND INTRAOCULAR               LENS PLACEMENT (IOC);  Surgeon: Leandrew Koyanagi, MD;               Location: [place];  Service: Ophthalmology;                Laterality: Left; 07/14/2017: CATARACT EXTRACTION W/PHACO; Right     Comment:  Procedure: CATARACT EXTRACTION PHACO AND INTRAOCULAR               LENS PLACEMENT ([place])  RIGHT;  Surgeon: Leandrew Koyanagi, MD;  Location: [place];  Service:               Ophthalmology;  Laterality: Right; No date: CHOLECYSTECTOMY 10/13/2018: ESOPHAGOGASTRODUODENOSCOPY (EGD) WITH PROPOFOL; N/A     Comment:  Procedure: ESOPHAGOGASTRODUODENOSCOPY (EGD) WITH               PROPOFOL;  Surgeon: [name], MD;  Location: [facility]               ENDOSCOPY;  Service: Gastroenterology;  Laterality: N/A; 10/13/2018: FLEXIBLE SIGMOIDOSCOPY; N/A     Comment:  Procedure: FLEXIBLE SIGMOIDOSCOPY;  Surgeon: [name], MD;  Location: [facility] ENDOSCOPY;  Service:  Gastroenterology;  Laterality: N/A; No date: ROTATOR CUFF REPAIR; Bilateral     Comment:  left-12/2009; right-03/2009 dr.califf No date: TONSILLECTOMY No date: TOTAL SHOULDER REPLACEMENT; Left     Reproductive/Obstetrics negative OB ROS                           Anesthesia Physical  Anesthesia Plan  ASA: 3  Anesthesia Plan: General   Post-op Pain Management: Minimal or no pain anticipated   Induction: Intravenous  PONV Risk Score and Plan: 3 and Treatment may vary due to age or medical condition, Propofol infusion and TIVA  Airway Management Planned: Natural Airway and Simple Face Mask  Additional Equipment:  None  Intra-op Plan:   Post-operative Plan:   Informed Consent: I have reviewed the patients History and Physical, chart, labs and discussed the procedure including the risks, benefits and alternatives for the proposed anesthesia with the patient or authorized representative who has indicated his/her understanding and acceptance.     Dental Advisory Given  Plan Discussed with: Anesthesiologist, CRNA and Surgeon  Anesthesia Plan Comments: (Patient consented for risks of anesthesia including but not limited to:  - adverse reactions to medications - risk of intubation if required - damage to teeth, lips or other oral mucosa - sore throat or hoarseness - Damage to heart, brain, lungs or loss of life  Patient voiced understanding.)       Anesthesia Quick Evaluation

## 2022-02-01 NOTE — H&P (Signed)
[name], MD [address] [place], [place], [place], [address] [address], [place], [place], [place], [address] Phone: [phone]  Fax: [phone]  Primary Care Physician:  [name], NP   Pre-Procedure History & Physical: HPI:  [name] is a 86 y.o. female is here for an colonoscopy.   Past Medical History:  Diagnosis Date   Altered mental state    Anginal pain ([employer])    Anxiety    Basal cell carcinoma 05/03/2021   right neck postauricular - Excised 06/12/21   BMI 30.0-30.9,adult    Cellulitis    Chest pain, atypical    Compression fracture of L4 lumbar vertebra    Constipation    Cystitis    Cystocele    Diverticulosis    Fatigue    Fibrosis, idiopathic pulmonary (HCC)    Gastroenteritis    History of back surgery    History of herniated intervertebral disc    HTN (hypertension)    Hypercholesteremia    Hypocalcemia    Incontinence of urine    OA (osteoarthritis)    shoulder and back   Obesity    Osteopenia    Pneumonia    Shortness of breath    Sleep apnea    Squamous cell carcinoma of skin 07/16/2016   Right upper lateral eyebrow. SCCis.   Squamous cell carcinoma of skin 01/13/2018   Left temporal hairline. WD SCC with superficial infiltration.   Thrush    Vitamin D deficiency     Past Surgical History:  Procedure Laterality Date   BACK SURGERY     CATARACT EXTRACTION W/PHACO Left 04/24/2016   Procedure: CATARACT EXTRACTION PHACO AND INTRAOCULAR LENS PLACEMENT (IOC);  Surgeon: Leandrew Koyanagi, MD;  Location: [place];  Service: Ophthalmology;  Laterality: Left;   CATARACT EXTRACTION W/PHACO Right 07/14/2017   Procedure: CATARACT EXTRACTION PHACO AND INTRAOCULAR LENS PLACEMENT ([place])  RIGHT;  Surgeon: Leandrew Koyanagi, MD;  Location: [place];  Service: Ophthalmology;  Laterality: Right;   CHOLECYSTECTOMY     CYSTOSCOPY/URETEROSCOPY/HOLMIUM LASER/STENT PLACEMENT Left 04/27/2021    Procedure: CYSTOSCOPY/URETEROSCOPY/HOLMIUM LASER/STENT PLACEMENT;  Surgeon: Billey Co, MD;  Location: [facility];  Service: Urology;  Laterality: Left;   ESOPHAGOGASTRODUODENOSCOPY (EGD) WITH PROPOFOL N/A 10/13/2018   Procedure: ESOPHAGOGASTRODUODENOSCOPY (EGD) WITH PROPOFOL;  Surgeon: [name], MD;  Location: [facility] ENDOSCOPY;  Service: Gastroenterology;  Laterality: N/A;   ESOPHAGOGASTRODUODENOSCOPY (EGD) WITH PROPOFOL N/A 11/22/2021   Procedure: ESOPHAGOGASTRODUODENOSCOPY (EGD) WITH PROPOFOL;  Surgeon: [name], MD;  Location: [facility] ENDOSCOPY;  Service: Gastroenterology;  Laterality: N/A;   EYE SURGERY     FLEXIBLE SIGMOIDOSCOPY N/A 10/13/2018   Procedure: FLEXIBLE SIGMOIDOSCOPY;  Surgeon: [name], MD;  Location: [facility] ENDOSCOPY;  Service: Gastroenterology;  Laterality: N/A;   FLEXIBLE SIGMOIDOSCOPY N/A 09/09/2019   Procedure: FLEXIBLE SIGMOIDOSCOPY;  Surgeon: [name], MD;  Location: [facility] ENDOSCOPY;  Service: Gastroenterology;  Laterality: N/A;   FLEXIBLE SIGMOIDOSCOPY N/A 08/04/2020   Procedure: FLEXIBLE SIGMOIDOSCOPY;  Surgeon: [name], MD;  Location: [facility] END[facility]COPY;  Service: Gastroenterology;  Laterality: N/A;  Per Dr. Vicente Males, unsedated   [place] N/A 08/31/2021   Procedure: FLEXIBLE SIGMOIDOSCOPY;  Surgeon: [name], MD;  Location: [facility] ENDOSCOPY;  Service: Gastroenterology;  Laterality: N/A;  no sedation   HIP ARTHROPLASTY Right 09/27/2020   Procedure: ARTHROPLASTY BIPOLAR HIP (HEMIARTHROPLASTY);  Surgeon: Hessie Knows, MD;  Location: [facility];  Service: Orthopedics;  Laterality: Right;   JOINT REPLACEMENT     ROTATOR CUFF REPAIR Bilateral  left-12/2009; right-03/2009 dr.califf   TONSILLECTOMY     TOTAL SHOULDER REPLACEMENT Left     Prior to Admission medications   Medication Sig Start Date End Date Taking? Authorizing Provider  atorvastatin (LIPITOR) 20 MG tablet Take 1 tablet (20 mg total) by mouth daily. For cholesterol. 03/02/21  Yes [name], NP  Biotin 10 MG CAPS Take 1 capsule by mouth daily.   Yes [provider]  budesonide (PULMICORT) 180 MCG/ACT inhaler Inhale 2 puffs into the lungs 2 (two) times daily.   Yes [provider]  citalopram (CELEXA) 40 MG tablet Take 40 mg by mouth daily. 11/04/21  Yes [provider]  denosumab (PROLIA) 60 MG/ML SOSY injection Inject 60 mg into the skin every 6 (six) months.   Yes [provider]  ESBRIET 267 MG TABS Take 267 mg by mouth 3 (three) times daily with meals. 02/25/17  Yes [provider]  famotidine (PEPCID) 20 MG tablet Take 1 tablet by mouth in the morning and at bedtime.   Yes [provider]  fluconazole (DIFLUCAN) 100 MG tablet Take 2 tablets on Day#1, then decrease to 1 tablet daily. 10/04/21  Yes Rubie Maid, MD  HYDROcodone bit-homatropine (HYCODAN) 5-1.5 MG/5ML syrup Take by mouth. 10/17/21  Yes [provider]  Magnesium Oxide 500 MG CAPS Take 1 capsule (500 mg total) by mouth 2 (two) times daily. For pain. 10/18/21  Yes [name], NP  metoprolol succinate (TOPROL-XL) 25 MG 24 hr tablet Take 25 mg by mouth every other day. 05/09/21  Yes [provider]  mupirocin ointment (BACTROBAN) 2 % Apply 1 application topically daily. With dressing changes 06/12/21  Yes Ralene Bathe, MD  nystatin cream (MYCOSTATIN) Apply 1 application topically 2 (two) times daily. 09/18/21  Yes Rubie Maid, MD  nystatin-triamcinolone ointment [facility]) Apply 1 application topically 2 (two) times daily. 10/04/21  Yes Rubie Maid, MD  OXYGEN Inhale 2 L into the lungs at bedtime.   Yes [provider]  Oxyquinoline-Sod Lauryl Sulf 0.025-0.01 % GEL Place 1 application. vaginally every 30 (thirty) days.   Yes [provider]  vitamin B-12 (CYANOCOBALAMIN) 1000 MCG tablet Take 1,000 mcg by mouth every other day.   Yes [provider]  VOLTAREN 1 % GEL Apply 2 g topically in the morning, at  noon, and at bedtime. Shoulders and back 12/23/17  Yes [provider]  gabapentin (NEURONTIN) 100 MG capsule Take 100 mg by mouth 2 (two) times daily. Patient not taking: Reported on 02/01/2022 05/22/21   [provider]  HYDROcodone-acetaminophen (NORCO/VICODIN) 5-325 MG tablet Take 1 tablet by mouth 3 (three) times daily as needed. 12/05/21   [provider]  Magnesium 500 MG CAPS Take 1 capsule by mouth as needed. Patient not taking: Reported on 02/01/2022 10/18/21   [provider]  sulfaSALAzine (AZULFIDINE) 500 MG tablet Take 1 tablet (500 mg total) by mouth 2 (two) times daily. 07/09/21 01/05/22  [name], MD    Allergies as of 01/30/2022   (No Known Allergies)    Family History  Problem Relation Age of Onset   COPD Mother    Cancer Brother        colon    Social History   Socioeconomic History   Marital status: Married    Spouse name: [name]   Number of children: 2   Years of education: Not on file   Highest education level: Not on file  Occupational History   Not  on file  Tobacco Use   Smoking status: Never   Smokeless tobacco: Never  Vaping Use   Vaping Use: Never used  Substance and Sexual Activity   Alcohol use: No   Drug use: No   Sexual activity: Never    Birth control/protection: None  Other Topics Concern   Not on file  Social History Narrative   Not on file   Social Determinants of Health   Financial Resource Strain: Low Risk    Difficulty of Paying Living Expenses: Not hard at all  Food Insecurity: No Food Insecurity   Worried About Charity fundraiser in the Last Year: Never true   [place] in the Last Year: Never true  Transportation Needs: No Transportation Needs   Lack of Transportation (Medical): No   Lack of Transportation (Non-Medical): No  Physical Activity: Inactive   Days of Exercise per Week: 0 days   Minutes of Exercise per Session: 0 min  Stress: Stress Concern Present   Feeling of Stress : To  some extent  Social Connections: Not on file  Intimate Partner Violence: Not At Risk   Fear of Current or Ex-Partner: No   Emotionally Abused: No   Physically Abused: No   Sexually Abused: No    Review of Systems: See HPI, otherwise negative ROS  Physical Exam: BP 124/71   Pulse 93   Temp (!) 96.6 F (35.9 C) (Temporal)   Resp 16   Ht 5' (1.524 m)   Wt 50 kg   SpO2 98%   BMI 21.53 kg/m  General:   Alert,  pleasant and cooperative in NAD Head:  Normocephalic and atraumatic. Neck:  Supple; no masses or thyromegaly. Lungs:  Clear throughout to auscultation, normal respiratory effort.    Heart:  +S1, +S2, Regular rate and rhythm, No edema. Abdomen:  Soft, nontender and nondistended. Normal bowel sounds, without guarding, and without rebound.   Neurologic:  Alert and  oriented x4;  grossly normal neurologically.  Impression/Plan: [name] is here for an colonoscopy to be performed for diarrhea.  Risks, benefits, limitations, and alternatives regarding  colonoscopy have been reviewed with the patient.  Questions have been answered.  All parties agreeable.   [name], MD  02/01/2022, 7:49 AM

## 2022-02-02 ENCOUNTER — Encounter: Payer: Self-pay | Admitting: Gastroenterology

## 2022-02-02 NOTE — Telephone Encounter (Signed)
Commence on Prednisone 40 mg a day for a week , then 30 mg a day for 5 days  , 25 mg a day for 5 days  , 20 mg a day for 5 days , 15 mg a day for 5 days , 10 mg a day for 5 days , 5 mg a day for 5 days , 2.5 mg a day every other day for 3 doses then stop.  Needs video visit with me in 2 weeks

## 2022-02-04 DIAGNOSIS — K5190 Ulcerative colitis, unspecified, without complications: Secondary | ICD-10-CM | POA: Diagnosis not present

## 2022-02-05 ENCOUNTER — Other Ambulatory Visit: Payer: Self-pay

## 2022-02-05 MED ORDER — PREDNISONE 10 MG PO TABS: ORAL_TABLET | ORAL | 0 refills | Status: DC

## 2022-02-06 ENCOUNTER — Other Ambulatory Visit: Payer: Self-pay

## 2022-02-06 LAB — SURGICAL PATHOLOGY

## 2022-02-06 MED ORDER — PREDNISONE 10 MG PO TABS
ORAL_TABLET | ORAL | 0 refills | Status: DC
Start: 2022-02-06 — End: 2022-02-06

## 2022-02-06 MED ORDER — PREDNISONE 10 MG PO TABS: ORAL_TABLET | ORAL | 0 refills | Status: AC

## 2022-02-11 NOTE — Progress Notes (Signed)
Needs hep b vaccine

## 2022-02-12 LAB — QUANTIFERON-TB GOLD PLUS
QuantiFERON Mitogen Value: 9.29 IU/mL
QuantiFERON Nil Value: 0.14 IU/mL
QuantiFERON TB1 Ag Value: 0.1 IU/mL
QuantiFERON TB2 Ag Value: 0.15 IU/mL
QuantiFERON-TB Gold Plus: NEGATIVE

## 2022-02-12 LAB — CBC WITH DIFFERENTIAL/PLATELET
Basophils Absolute: 0.1 10*3/uL (ref 0.0–0.2)
Basos: 1 %
EOS (ABSOLUTE): 0.9 10*3/uL — ABNORMAL HIGH (ref 0.0–0.4)
Eos: 6 %
Hematocrit: 35.9 % (ref 34.0–46.6)
Hemoglobin: 11.7 g/dL (ref 11.1–15.9)
Immature Grans (Abs): 0.1 10*3/uL (ref 0.0–0.1)
Immature Granulocytes: 1 %
Lymphocytes Absolute: 1.5 10*3/uL (ref 0.7–3.1)
Lymphs: 11 %
MCH: 28.7 pg (ref 26.6–33.0)
MCHC: 32.6 g/dL (ref 31.5–35.7)
MCV: 88 fL (ref 79–97)
Monocytes Absolute: 1.5 10*3/uL — ABNORMAL HIGH (ref 0.1–0.9)
Monocytes: 11 %
Neutrophils Absolute: 9.4 10*3/uL — ABNORMAL HIGH (ref 1.4–7.0)
Neutrophils: 70 %
Platelets: 457 10*3/uL — ABNORMAL HIGH (ref 150–450)
RBC: 4.07 x10E6/uL (ref 3.77–5.28)
RDW: 12.5 % (ref 11.7–15.4)
WBC: 13.5 10*3/uL — ABNORMAL HIGH (ref 3.4–10.8)

## 2022-02-12 LAB — HEPATITIS C ANTIBODY: Hep C Virus Ab: NONREACTIVE

## 2022-02-12 LAB — C-REACTIVE PROTEIN: CRP: 49 mg/L — ABNORMAL HIGH (ref 0–10)

## 2022-02-12 LAB — HEPATITIS A ANTIBODY, TOTAL: hep A Total Ab: POSITIVE — AB

## 2022-02-12 LAB — HEPATITIS B SURFACE ANTIBODY,QUALITATIVE: Hep B Surface Ab, Qual: NONREACTIVE

## 2022-02-12 LAB — HEPATITIS B E ANTIBODY: Hep B E Ab: NEGATIVE

## 2022-02-12 LAB — VITAMIN D 1,25 DIHYDROXY
Vitamin D 1, 25 (OH)2 Total: 90 pg/mL — ABNORMAL HIGH
Vitamin D2 1, 25 (OH)2: <10 pg/mL
Vitamin D3 1, 25 (OH)2: 87 pg/mL

## 2022-02-12 LAB — HEPATITIS B E ANTIGEN: Hep B E Ag: NEGATIVE

## 2022-02-12 LAB — HEPATITIS B CORE ANTIBODY, TOTAL: Hep B Core Total Ab: NEGATIVE

## 2022-02-12 LAB — HEPATITIS B SURFACE ANTIGEN: Hepatitis B Surface Ag: NEGATIVE

## 2022-02-12 LAB — HIV ANTIBODY (ROUTINE TESTING W REFLEX): HIV Screen 4th Generation wRfx: NONREACTIVE

## 2022-02-14 ENCOUNTER — Ambulatory Visit
Admission: RE | Admit: 2022-02-14 | Discharge: 2022-02-14 | Disposition: A | Discharge status: Home / Self Care | Payer: Medicare HMO | Source: Ambulatory Visit | Attending: Gastroenterology | Admitting: Gastroenterology

## 2022-02-14 ENCOUNTER — Encounter: Payer: Self-pay | Admitting: Gastroenterology

## 2022-02-14 DIAGNOSIS — J449 Chronic obstructive pulmonary disease, unspecified: Secondary | ICD-10-CM | POA: Diagnosis not present

## 2022-02-14 DIAGNOSIS — K5100 Ulcerative (chronic) pancolitis without complications: Secondary | ICD-10-CM | POA: Diagnosis not present

## 2022-02-14 DIAGNOSIS — K6389 Other specified diseases of intestine: Secondary | ICD-10-CM | POA: Diagnosis not present

## 2022-02-14 DIAGNOSIS — K5190 Ulcerative colitis, unspecified, without complications: Secondary | ICD-10-CM | POA: Insufficient documentation

## 2022-02-14 DIAGNOSIS — N200 Calculus of kidney: Secondary | ICD-10-CM | POA: Diagnosis not present

## 2022-02-14 MED ORDER — GADOBUTROL 1 MMOL/ML IV SOLN
5.0000 mL | Freq: Once | INTRAVENOUS | Status: AC | PRN
Start: 2022-02-14 — End: 2022-02-14
  Administered 2022-02-14: 5 mL via INTRAVENOUS

## 2022-02-18 ENCOUNTER — Other Ambulatory Visit: Payer: Self-pay

## 2022-02-18 ENCOUNTER — Encounter: Payer: Self-pay | Admitting: Gastroenterology

## 2022-02-18 ENCOUNTER — Ambulatory Visit: Payer: Medicare HMO | Admitting: Gastroenterology

## 2022-02-18 VITALS — BP 140/66 | HR 66 | Temp 98.7°F | Wt 109.6 lb

## 2022-02-18 DIAGNOSIS — Z79899 Other long term (current) drug therapy: Secondary | ICD-10-CM

## 2022-02-18 DIAGNOSIS — K5190 Ulcerative colitis, unspecified, without complications: Secondary | ICD-10-CM

## 2022-02-18 DIAGNOSIS — Z8619 Personal history of other infectious and parasitic diseases: Secondary | ICD-10-CM

## 2022-02-18 NOTE — Progress Notes (Signed)
Terri Bellows MD, MRCP(U.K) [address]  [place]  [place], [place] [address]  Main: [phone]  Fax: [phone]   Primary Care Physician: [name], NP  Primary Gastroenterologist:  Dr. Jonathon [name]   Chief Complaint  Patient presents with   Ulcerative Colitis    HPI: [name] is a 86 y.o. female Summary of history :   Since approximately early 2021 she has been on treatment with sulfasalazine for ulcerative pancolitis.Has had a few episodes of recurrent C diff colitis.  She was seen in 2018 by [facility] clinic GI  in 08/2008 she had a colonoscopy as per the last GI note showed chronic colitis with focal cryptitis.  Focal active colitis.   Not sure why it was not felt that this patient had inflammatory bowel disease.  At her initial visit she had been on Meloxicam for about 2-3  Months, 2 tablets a day and no other NSAID's.  She also did have blood in her stool at that point of time.     Treated in  07/03/2020 for C. difficile diarrhea.  on 07/11/2020, 03/03/2021,  10/24/2021: S/p Stool transplant for c diff     08/31/2021: Flexible sigmoidoscopy  Normal mucosa was found in the left colon. Biopsies were taken with a cold forceps for histology. Biopsies showed mild chronic proctocolitis.    11/12/2021: mild esophageal dysmotility   11/22/2021: EGD: Normal : bx of esophagus normal  12/10/2021: Stool for PCR and  c diff negative for infection        Interval history  01/03/2022-02/18/2022  01/17/2022: CT abdomen : no acute abnormalities 02/01/2022: Colonoscopy: Patchy moderate mucosal changes characterized by mucus and serpentine ulcerations were found in the entire colon. Rectum appeared spared, unable to intubate terminal ileum. Biopsies showed  moderate chronic active colitis. CMV negative.  02/04/2022: Hb 11.7 , CRP 49 , TB quant negative, Hep A ab +ve , needs hep b vaccine,  02/14/2022: MR enterogram :Mucosal hyperenhancement of the descending and sigmoid colon  compatible with colitis    She is doing well.  She is taking 25 mg of prednisone daily.  Having 4-5 bowel movements a day more formed.  No rectal bleeding.  No abdominal pain.     Current Outpatient Medications  Medication Sig Dispense Refill   atorvastatin (LIPITOR) 20 MG tablet Take 1 tablet (20 mg total) by mouth daily. For cholesterol. 90 tablet 3   Biotin 10 MG CAPS Take 1 capsule by mouth daily.     budesonide (PULMICORT) 180 MCG/ACT inhaler Inhale 2 puffs into the lungs 2 (two) times daily.     citalopram (CELEXA) 40 MG tablet Take 40 mg by mouth daily.     denosumab (PROLIA) 60 MG/ML SOSY injection Inject 60 mg into the skin every 6 (six) months.     ESBRIET 267 MG TABS Take 267 mg by mouth 3 (three) times daily with meals.     famotidine (PEPCID) 20 MG tablet Take 1 tablet by mouth in the morning and at bedtime.     fluconazole (DIFLUCAN) 100 MG tablet Take 2 tablets on Day#1, then decrease to 1 tablet daily. 15 tablet 0   gabapentin (NEURONTIN) 100 MG capsule Take 100 mg by mouth 2 (two) times daily.     HYDROcodone bit-homatropine (HYCODAN) 5-1.5 MG/5ML syrup Take by mouth.     HYDROcodone-acetaminophen (NORCO/VICODIN) 5-325 MG tablet Take 1 tablet by mouth 3 (three) times daily as needed.     Magnesium 500  MG CAPS Take 1 capsule by mouth as needed.     metoprolol succinate (TOPROL-XL) 25 MG 24 hr tablet Take 25 mg by mouth every other day.     mupirocin ointment (BACTROBAN) 2 % Apply 1 application topically daily. With dressing changes 22 g 0   nystatin cream (MYCOSTATIN) Apply 1 application topically 2 (two) times daily. 30 g 1   nystatin-triamcinolone ointment (MYCOLOG) Apply 1 application topically 2 (two) times daily. 30 g 0   OXYGEN Inhale 2 L into the lungs at bedtime.     Oxyquinoline-Sod Lauryl Sulf 0.025-0.01 % GEL Place 1 application. vaginally every 30 (thirty) days.     predniSONE (DELTASONE) 10 MG tablet Take 40 mg a day for a week , then 30 mg a day for 5 days, 25  mg a day for 5 days, 20 mg a day for 5 days, 15 mg a day for 5 days, 10 mg a day for 5 days, 5 mg a day for 5 days, 2.5 mg a day every other day for 3 doses then stop. 80 tablet 0   sulfaSALAzine (AZULFIDINE) 500 MG tablet Take 1 tablet (500 mg total) by mouth 2 (two) times daily. 180 tablet 3   vitamin B-12 (CYANOCOBALAMIN) 1000 MCG tablet Take 1,000 mcg by mouth every other day.     VOLTAREN 1 % GEL Apply 2 g topically in the morning, at noon, and at bedtime. Shoulders and back  2   No current facility-administered medications for this visit.    Allergies as of 02/18/2022   (No Known Allergies)    ROS:  General: Negative for anorexia, weight loss, fever, chills, fatigue, weakness. ENT: Negative for hoarseness, difficulty swallowing , nasal congestion. CV: Negative for chest pain, angina, palpitations, dyspnea on exertion, peripheral edema.  Respiratory: Negative for dyspnea at rest, dyspnea on exertion, cough, sputum, wheezing.  GI: See history of present illness. GU:  Negative for dysuria, hematuria, urinary incontinence, urinary frequency, nocturnal urination.  Endo: Negative for unusual weight change.    Physical Examination:   BP 140/66   Pulse 66   Temp 98.7 F (37.1 C) (Oral)   Wt 109 lb 9.6 oz (49.7 kg)   BMI 21.40 kg/m   General: Well-nourished, well-developed in no acute distress.  Eyes: No icterus. Conjunctivae pink. Mouth: Oropharyngeal mucosa moist and pink , no lesions erythema or exudate. Neuro: Alert and oriented x 3.  Grossly intact. Skin: Warm and dry, no jaundice.   Psych: Alert and cooperative, normal mood and affect.   Imaging Studies: MR ENTERO PELVIS W WO CONTRAST  Result Date: 02/15/2022 CLINICAL DATA:  A female at age 30 presents for evaluation of ulcerative colitis and diarrhea. EXAM: MR ABDOMEN AND PELVIS WITHOUT AND WITH CONTRAST (MR ENTEROGRAPHY) TECHNIQUE: Multiplanar, multisequence MRI of the abdomen and pelvis was performed both before and  during bolus administration of intravenous contrast. Negative oral contrast VoLumen was given. CONTRAST:  92m GADAVIST GADOBUTROL 1 MMOL/ML IV SOLN COMPARISON:  CT of the abdomen and pelvis of Jan 17, 2022. FINDINGS: COMBINED FINDINGS FOR BOTH MR ABDOMEN AND PELVIS Lower chest: Heart size is enlarged. There is evidence of small amounts of pleural fluid in the LEFT and RIGHT chest. Basilar atelectasis. Interstitial lung disease seen on the prior chest CT is better characterized on previous chest imaging and abdominal imaging with CT. Hepatobiliary: No focal, suspicious hepatic lesion. The portal vein is patent. Hepatic veins are patent. No biliary duct distension following cholecystectomy. Pancreas: No signs of  pancreatic inflammation or ductal dilation. The preservation of intrinsic T1 signal of the pancreas. Mild pancreatic atrophy. Spleen:  Normal. Adrenals/Urinary Tract:  Adrenal glands are normal. No hydronephrosis or perinephric stranding. No gross perivesical stranding though susceptibility artifact from RIGHT hip arthroplasty limits assessment of the pelvis. Susceptibility artifact also limited prior CT imaging. Stomach/Bowel: Mucosal hyperenhancement of the descending and sigmoid colon compatible with colitis though without substantial surrounding stranding on other sequences. No sign of pelvic abscess. Diverticular changes are noted but inflammation extends to the level of the splenic flexure away from areas of diverticular change. Degree of thickening of the colon is less than on the previous CT. There was some hypervascularity in the adjacent pericolonic fat and this persists on the current study. Fluid-filled proximal colon and small bowel without signs of bowel obstruction, fistula or abscess. RIGHT colon with limited assessment in the area of the cecum due to susceptibility artifact. Vascular/Lymphatic: Vascular structures in the abdomen are grossly patent. No signs of aneurysmal dilation of the  abdominal aorta. No signs of adenopathy in the abdomen or pelvis. Reproductive: Post hysterectomy. Other:  No ascites Musculoskeletal: Signs of cement augmentation of lumbar and lower thoracic vertebral bodies with evidence of prior compression fractures similar to prior imaging. IMPRESSION: 1. Mucosal hyperenhancement of the descending and sigmoid colon compatible with colitis though without substantial surrounding stranding on other sequences. Degree of thickening of the colon is less than on the previous CT. Distribution of colitis favors an infectious or inflammatory colitis over simple diverticulitis though there are diverticular changes in the sigmoid. 2. Hypervascularity in the adjacent pericolonic fat adjacent to affect the descending and sigmoid colon compatible with the "comb sign" another marker of inflammation. 3. No signs of bowel obstruction, fistula or abscess. 4. No signs of pelvic abscess. 5. Interstitial lung disease better characterized on previous CT imaging as is nephrolithiasis. There may also be trace bilateral effusions. Electronically Signed   By: Zetta Bills M.D.   On: 02/15/2022 17:04   MR ENTERO ABDOMEN W WO CONTRAST  Result Date: 02/15/2022 CLINICAL DATA:  A female at age 33 presents for evaluation of ulcerative colitis and diarrhea. EXAM: MR ABDOMEN AND PELVIS WITHOUT AND WITH CONTRAST (MR ENTEROGRAPHY) TECHNIQUE: Multiplanar, multisequence MRI of the abdomen and pelvis was performed both before and during bolus administration of intravenous contrast. Negative oral contrast VoLumen was given. CONTRAST:  42m GADAVIST GADOBUTROL 1 MMOL/ML IV SOLN COMPARISON:  CT of the abdomen and pelvis of Jan 17, 2022. FINDINGS: COMBINED FINDINGS FOR BOTH MR ABDOMEN AND PELVIS Lower chest: Heart size is enlarged. There is evidence of small amounts of pleural fluid in the LEFT and RIGHT chest. Basilar atelectasis. Interstitial lung disease seen on the prior chest CT is better characterized on  previous chest imaging and abdominal imaging with CT. Hepatobiliary: No focal, suspicious hepatic lesion. The portal vein is patent. Hepatic veins are patent. No biliary duct distension following cholecystectomy. Pancreas: No signs of pancreatic inflammation or ductal dilation. The preservation of intrinsic T1 signal of the pancreas. Mild pancreatic atrophy. Spleen:  Normal. Adrenals/Urinary Tract:  Adrenal glands are normal. No hydronephrosis or perinephric stranding. No gross perivesical stranding though susceptibility artifact from RIGHT hip arthroplasty limits assessment of the pelvis. Susceptibility artifact also limited prior CT imaging. Stomach/Bowel: Mucosal hyperenhancement of the descending and sigmoid colon compatible with colitis though without substantial surrounding stranding on other sequences. No sign of pelvic abscess. Diverticular changes are noted but inflammation extends to the level of the splenic  flexure away from areas of diverticular change. Degree of thickening of the colon is less than on the previous CT. There was some hypervascularity in the adjacent pericolonic fat and this persists on the current study. Fluid-filled proximal colon and small bowel without signs of bowel obstruction, fistula or abscess. RIGHT colon with limited assessment in the area of the cecum due to susceptibility artifact. Vascular/Lymphatic: Vascular structures in the abdomen are grossly patent. No signs of aneurysmal dilation of the abdominal aorta. No signs of adenopathy in the abdomen or pelvis. Reproductive: Post hysterectomy. Other:  No ascites Musculoskeletal: Signs of cement augmentation of lumbar and lower thoracic vertebral bodies with evidence of prior compression fractures similar to prior imaging. IMPRESSION: 1. Mucosal hyperenhancement of the descending and sigmoid colon compatible with colitis though without substantial surrounding stranding on other sequences. Degree of thickening of the colon is less  than on the previous CT. Distribution of colitis favors an infectious or inflammatory colitis over simple diverticulitis though there are diverticular changes in the sigmoid. 2. Hypervascularity in the adjacent pericolonic fat adjacent to affect the descending and sigmoid colon compatible with the "comb sign" another marker of inflammation. 3. No signs of bowel obstruction, fistula or abscess. 4. No signs of pelvic abscess. 5. Interstitial lung disease better characterized on previous CT imaging as is nephrolithiasis. There may also be trace bilateral effusions. Electronically Signed   By: Zetta Bills M.D.   On: 02/15/2022 16:24    Assessment and Plan:   NAVAEH KEHRES is a 86 y.o. y/o femalehere to follow-up for chronic ulcerative colitis and has been doing well on sulfasalazine with no other GI symptoms.  Recent history of recurrent C. difficile colitis treated with vancomycin in 2021 and in November 2022 treated with Dificid and stool transplant in 10/2021 .  She is up-to-date with her tetanus, influenza, DEXA scan and pneumococcal vaccination.  Health maintenance indicates she she has received Shingrix dose in 04/21/2020 and COVID vaccination.    .   Plan Needs Hep B vaccine at out office , shingrix second dose if not already had, pneumococcal vaccine which have advised her to get at [facility] or with [name], NP  Commence on Rodrigo Ran of prednisone gradually presently on 25 mg a day Stop sulfasalazine  I have discussed the risks of commencing on a biological agent such as Entyvio including but not limited to infections, allergic reaction, increased risk of malignancy.  Weighing the risks versus benefits in her case the benefits likely exceed the risks and decided to proceed.     Dr Terri Bellows  MD,MRCP [facility]) Follow up in 10 to 14 days

## 2022-02-19 ENCOUNTER — Ambulatory Visit (INDEPENDENT_AMBULATORY_CARE_PROVIDER_SITE_OTHER): Payer: Medicare HMO

## 2022-02-19 VITALS — Ht 60.0 in | Wt 109.0 lb

## 2022-02-19 DIAGNOSIS — Z0000 Encounter for general adult medical examination without abnormal findings: Secondary | ICD-10-CM

## 2022-02-19 NOTE — Progress Notes (Cosign Needed)
c Virtual Visit via Telephone Note  I connected with  [name] on 02/19/22 at 10:45 AM EDT by telephone and verified that I am speaking with the correct person using two identifiers.  Location: Patient: home Provider: [place] Persons participating in the virtual visit: [place]   I discussed the limitations, risks, security and privacy concerns of performing an evaluation and management service by telephone and the availability of in person appointments. The patient expressed understanding and agreed to proceed.  Interactive audio and video telecommunications were attempted between this nurse and patient, however failed, due to patient having technical difficulties OR patient did not have access to video capability.  We continued and completed visit with audio only.  Some vital signs may be absent or patient reported.   Dionisio David, LPN  Subjective:   [name] is a 86 y.o. female who presents for Medicare Annual (Subsequent) preventive examination.  Review of Systems           Objective:    Today's Vitals   02/19/22 1042  PainSc: 0-No pain   There is no height or weight on file to calculate BMI.     02/01/2022    7:10 AM 11/22/2021    9:08 AM 08/31/2021    9:57 AM 07/31/2021    2:24 PM 07/24/2021    2:15 PM 06/19/2021    9:18 AM 04/27/2021   11:14 AM  Advanced Directives  Does Patient Have a Medical Advance Directive? Yes Yes Yes Yes Yes Yes No  Type of Corporate treasurer of [place];Living will  [place];Living will [place];Living will    Copy of [place] in Chart?    No - copy requested No - copy requested    Would patient like information on creating a medical advance directive?       No - Patient declined    Current Medications (verified) Outpatient Encounter Medications as of 02/19/2022  Medication Sig   citalopram (CELEXA) 40 MG tablet  Take 40 mg by mouth daily.   denosumab (PROLIA) 60 MG/ML SOSY injection Inject 60 mg into the skin every 6 (six) months.   ESBRIET 267 MG TABS Take 267 mg by mouth 3 (three) times daily with meals.   HYDROcodone-acetaminophen (NORCO/VICODIN) 5-325 MG tablet Take 1 tablet by mouth 3 (three) times daily as needed.   mupirocin ointment (BACTROBAN) 2 % Apply 1 application topically daily. With dressing changes   nystatin cream (MYCOSTATIN) Apply 1 application topically 2 (two) times daily.   nystatin-triamcinolone ointment (MYCOLOG) Apply 1 application topically 2 (two) times daily.   OXYGEN Inhale 2 L into the lungs at bedtime.   VOLTAREN 1 % GEL Apply 2 g topically in the morning, at noon, and at bedtime. Shoulders and back   atorvastatin (LIPITOR) 20 MG tablet Take 1 tablet (20 mg total) by mouth daily. For cholesterol. (Patient not taking: Reported on 02/19/2022)   Biotin 10 MG CAPS Take 1 capsule by mouth daily. (Patient not taking: Reported on 02/19/2022)   budesonide (PULMICORT) 180 MCG/ACT inhaler Inhale 2 puffs into the lungs 2 (two) times daily. (Patient not taking: Reported on 02/19/2022)   famotidine (PEPCID) 20 MG tablet Take 1 tablet by mouth in the morning and at bedtime. (Patient not taking: Reported on 02/19/2022)   fluconazole (DIFLUCAN) 100 MG tablet Take 2 tablets on Day#1, then decrease to 1 tablet daily. (Patient not taking: Reported on  02/19/2022)   gabapentin (NEURONTIN) 100 MG capsule Take 100 mg by mouth 2 (two) times daily. (Patient not taking: Reported on 02/19/2022)   HYDROcodone bit-homatropine (HYCODAN) 5-1.5 MG/5ML syrup Take by mouth. (Patient not taking: Reported on 02/19/2022)   Magnesium 500 MG CAPS Take 1 capsule by mouth as needed. (Patient not taking: Reported on 02/19/2022)   metoprolol succinate (TOPROL-XL) 25 MG 24 hr tablet Take 25 mg by mouth every other day. (Patient not taking: Reported on 02/19/2022)   Oxyquinoline-Sod Lauryl Sulf 0.025-0.01 % GEL Place 1  application. vaginally every 30 (thirty) days. (Patient not taking: Reported on 02/19/2022)   predniSONE (DELTASONE) 10 MG tablet Take 40 mg a day for a week , then 30 mg a day for 5 days, 25 mg a day for 5 days, 20 mg a day for 5 days, 15 mg a day for 5 days, 10 mg a day for 5 days, 5 mg a day for 5 days, 2.5 mg a day every other day for 3 doses then stop. (Patient not taking: Reported on 02/19/2022)   sulfaSALAzine (AZULFIDINE) 500 MG tablet Take 1 tablet (500 mg total) by mouth 2 (two) times daily.   vitamin B-12 (CYANOCOBALAMIN) 1000 MCG tablet Take 1,000 mcg by mouth every other day. (Patient not taking: Reported on 02/19/2022)   No facility-administered encounter medications on file as of 02/19/2022.    Allergies (verified) Patient has no known allergies.   History: Past Medical History:  Diagnosis Date   Altered mental state    Anginal pain ([place])    Anxiety    Basal cell carcinoma 05/03/2021   right neck postauricular - Excised 06/12/21   BMI 30.0-30.9,adult    Cellulitis    Chest pain, atypical    Compression fracture of L4 lumbar vertebra    Constipation    Cystitis    Cystocele    Diverticulosis    Fatigue    Fibrosis, idiopathic pulmonary (HCC)    Gastroenteritis    History of back surgery    History of herniated intervertebral disc    HTN (hypertension)    Hypercholesteremia    Hypocalcemia    Incontinence of urine    OA (osteoarthritis)    shoulder and back   Obesity    Osteopenia    Pneumonia    Shortness of breath    Sleep apnea    Squamous cell carcinoma of skin 07/16/2016   Right upper lateral eyebrow. SCCis.   Squamous cell carcinoma of skin 01/13/2018   Left temporal hairline. WD SCC with superficial infiltration.   Thrush    Vitamin D deficiency    Past Surgical History:  Procedure Laterality Date   BACK SURGERY     CATARACT EXTRACTION W/PHACO Left 04/24/2016   Procedure: CATARACT EXTRACTION PHACO AND INTRAOCULAR LENS PLACEMENT (IOC);  Surgeon:  Leandrew Koyanagi, MD;  Location: [facility];  Service: Ophthalmology;  Laterality: Left;   CATARACT EXTRACTION W/PHACO Right 07/14/2017   Procedure: CATARACT EXTRACTION PHACO AND INTRAOCULAR LENS PLACEMENT ([place])  RIGHT;  Surgeon: Leandrew Koyanagi, MD;  Location: [facility];  Service: Ophthalmology;  Laterality: Right;   CHOLECYSTECTOMY     COLONOSCOPY WITH PROPOFOL N/A 02/01/2022   Procedure: COLONOSCOPY WITH PROPOFOL;  Surgeon: [name], MD;  [facility] ENDOSCOPY;  Service: Gastroenterology;  Laterality: N/A;   CYSTOSCOPY/URETEROSCOPY/HOLMIUM LASER/STENT PLACEMENT Left 04/27/2021   Procedure: CYSTOSCOPY/URETEROSCOPY/HOLMIUM LASER/STENT PLACEMENT;  Surgeon: Billey Co, MD;  Location: [facility] ORS;  Service: Urology;  Laterality: Left;   ESOPHAGOGASTRODUODENOSCOPY (  EGD) WITH PROPOFOL N/A 10/13/2018   Procedure: ESOPHAGOGASTRODUODENOSCOPY (EGD) WITH PROPOFOL;  Surgeon: [name], MD;  Location[facility] ENDOSCOPY;  Service: Gastroenterology;  Laterality: N/A;   ESOPHAGOGASTRODUODENOSCOPY (EGD) WITH PROPOFOL N/A 11/22/2021   Procedure: ESOPHAGOGASTRODUODENOSCOPY (EGD) WITH PROPOFOL;  Surgeon: [name], MD;  Location: [facility] ENDOSCOPY;  Service: Gastroenterology;  Laterality: N/A;   EYE SURGERY     FLEXIBLE SIGMOIDOSCOPY N/A 10/13/2018   Procedure: FLEXIBLE SIGMOIDOSCOPY;  Surgeon: [name], MD;  Location: [facility] ENDOSCOPY;  Service: Gastroenterology;  Laterality: N/A;   FLEXIBLE SIGMOIDOSCOPY N/A 09/09/2019   Procedure: FLEXIBLE SIGMOIDOSCOPY;  Surgeon: [name], MD;  Location: [facility] ENDOSCOPY;  Service: Gastroenterology;  Laterality: N/A;   FLEXIBLE SIGMOIDOSCOPY N/A 08/04/2020   Procedure: FLEXIBLE SIGMOIDOSCOPY;  Surgeon: [name], MD;  Location: [facility] ENDOSCOPY;  Service: Gastroenterology;  Laterality: N/A;  Per Dr. Vicente Males, unsedated   [name] N/A 08/31/2021   Procedure: FLEXIBLE SIGMOIDOSCOPY;  Surgeon: [name], MD;  Location: [facility] ENDOSCOPY;   Service: Gastroenterology;  Laterality: N/A;  no sedation   HIP ARTHROPLASTY Right 09/27/2020   Procedure: ARTHROPLASTY BIPOLAR HIP (HEMIARTHROPLASTY);  Surgeon: Hessie Knows, MD;  Location: [facility] ORS;  Service: Orthopedics;  Laterality: Right;   JOINT REPLACEMENT     ROTATOR CUFF REPAIR Bilateral    left-12/2009; right-03/2009 dr.[email or web address]   TONSILLECTOMY     TOTAL SHOULDER REPLACEMENT Left    Family History  Problem Relation Age of Onset   COPD Mother    Cancer Brother        colon   Social History   Socioeconomic History   Marital status: Married    Spouse name: [name]   Number of children: 2   Years of education: Not on file   Highest education level: Not on file  Occupational History   Not on file  Tobacco Use   Smoking status: Never   Smokeless tobacco: Never  Vaping Use   Vaping Use: Never used  Substance and Sexual Activity   Alcohol use: No   Drug use: No   Sexual activity: Never    Birth control/protection: None  Other Topics Concern   Not on file  Social History Narrative   Not on file   Social Determinants of Health   Financial Resource Strain: Low Risk  (02/16/2021)   Overall Financial Resource Strain (CARDIA)    Difficulty of Paying Living Expenses: Not hard at all  Food Insecurity: No Food Insecurity (02/16/2021)   Hunger Vital Sign    Worried About Running Out of Food in the Last Year: Never true    [place] in the Last Year: Never true  Transportation Needs: No Transportation Needs (02/16/2021)   PRAPARE - Hydrologist (Medical): No    Lack of Transportation (Non-Medical): No  Physical Activity: Inactive (02/19/2022)   Exercise Vital Sign    Days of Exercise per Week: 0 days    Minutes of Exercise per Session: 0 min  Stress: No Stress Concern Present (02/19/2022)   [place]    Feeling of Stress : Only a little  Social Connections: Not on file     Tobacco Counseling Counseling given: Not Answered   Clinical Intake:  Pre-visit preparation completed: Yes  Pain : No/denies pain Pain Score: 0-No pain     Diabetes: No  How often do you need to have someone help you when you read instructions, pamphlets, or other written materials from your doctor or  pharmacy?: 1 - Never  Diabetic?no  Interpreter Needed?: No  Information entered by :: Kirke Shaggy, LPN   Activities of Daily Living     No data to display          Patient Care Team: [name], NP as PCP - General (Internal Medicine) Leandrew Koyanagi, MD as Referring Physician (Ophthalmology) Erby Pian, MD as Referring Physician (Specialist) Yolonda Kida, MD as Consulting Physician (Cardiology) Rubie Maid, MD as Referring Physician (Obstetrics and Gynecology) Catalina Antigua as Referring Physician (Physician Assistant) Thornton Park, MD as Referring Physician (Orthopedic Surgery) Charlton Haws, [facility] as Pharmacist (Pharmacist)  Indicate any recent Medical Services you may have received from other than [facility] providers in the past year (date may be approximate).     Assessment:   This is a routine wellness examination for [name].  Hearing/Vision screen No results found.  Dietary issues and exercise activities discussed:     Goals Addressed             This Visit's Progress    DIET - EAT MORE FRUITS AND VEGETABLES         Depression Screen    02/19/2022   10:46 AM 09/26/2021    1:59 PM 07/31/2021    2:24 PM 07/24/2021    2:15 PM 06/19/2021    9:18 AM 05/17/2021   10:23 AM 02/27/2021    1:49 PM  PHQ 2/9 Scores  PHQ - 2 Score 1 0 0 0 0 0 0  PHQ- 9 Score 3          Fall Risk    09/26/2021    1:59 PM 07/31/2021    2:24 PM 07/24/2021    2:15 PM 06/19/2021    9:18 AM 06/05/2021    2:44 PM  [place] in the past year? 0 1 1 0 0  Comment  Fall on 06/29/21 Fall on 06/29/21    Number falls  in past yr:  1 1    Injury with Fall?  1 1    Risk for fall due to :   History of fall(s)      FALL RISK PREVENTION PERTAINING TO THE HOME:  Any stairs in or around the home? No  If so, are there any without handrails? Yes  Home free of loose throw rugs in walkways, pet beds, electrical cords, etc? Yes  Adequate lighting in your home to reduce risk of falls? Yes   ASSISTIVE DEVICES UTILIZED TO PREVENT FALLS:  Life alert? No  Use of a cane, walker or w/c? Yes  Grab bars in the bathroom? No  Shower chair or bench in shower? Yes  Elevated toilet seat or a handicapped toilet? Yes    Cognitive Function:declined 2023     02/16/2021   10:46 AM  MMSE - Mini Mental State Exam  Not completed: Refused        Immunizations Immunization History  Administered Date(s) Administered   Fluad Quad(high Dose 65+) 04/30/2019, 04/30/2019   Influenza, High Dose Seasonal PF 06/21/2015, 05/18/2016, 05/31/2017, 06/18/2018   Influenza-Unspecified 05/11/2020, 06/16/2021   PFIZER Comirnaty(Gray Top)Covid-19 Tri-Sucrose Vaccine 10/27/2019, 11/17/2019, 10/02/2020, 02/19/2021   PFIZER(Purple Top)SARS-COV-2 Vaccination 10/27/2019, 11/17/2019, 02/19/2021   Pneumococcal Conjugate-13 04/18/2014   Pneumococcal Polysaccharide-23 07/24/2004, 12/28/2013   Td 09/02/2001   Tdap 05/25/2011   Zoster Recombinat (Shingrix) 04/14/2020   Zoster, Live 04/19/2010    TDAP status: Due, Education has been provided regarding the importance of this vaccine.  Advised may receive this vaccine at local pharmacy or Health Dept. Aware to provide a copy of the vaccination record if obtained from local pharmacy or Health Dept. Verbalized acceptance and understanding.  Flu Vaccine status: Up to date  Pneumococcal vaccine status: Up to date  Covid-19 vaccine status: Completed vaccines  Qualifies for Shingles Vaccine? Yes   Zostavax completed Yes   Shingrix Completed?: No.    Education has been provided regarding the  importance of this vaccine. Patient has been advised to call insurance company to determine out of pocket expense if they have not yet received this vaccine. Advised may also receive vaccine at local pharmacy or Health Dept. Verbalized acceptance and understanding.  Screening Tests Health Maintenance  Topic Date Due   Zoster Vaccines- Shingrix (2 of 2) 06/09/2020   TETANUS/TDAP  05/24/2021   INFLUENZA VACCINE  04/02/2022   Pneumonia Vaccine 59+ Years old  Completed   DEXA SCAN  Completed   COVID-19 Vaccine  Completed   HPV VACCINES  Aged Out    Health Maintenance  Health Maintenance Due  Topic Date Due   Zoster Vaccines- Shingrix (2 of 2) 06/09/2020   TETANUS/TDAP  05/24/2021    Colorectal cancer screening: No longer required.   Mammogram status: No longer required due to age.  Bone Density status: Completed 09/23/18. Results reflect: Bone density results: OSTEOPOROSIS. Repeat every 2 years.- has appt in [place] @ [facility] Screening: (Low Dose CT Chest recommended if Age 1-80 years, 30 pack-year currently smoking OR have quit w/in 15years.) does not qualify.    Additional Screening:  Hepatitis C Screening: does not qualify; Completed no  Vision Screening: Recommended annual ophthalmology exams for early detection of glaucoma and other disorders of the eye. Is the patient up to date with their annual eye exam?  Yes  Who is the provider or what is the name of the office in which the patient attends annual eye exams? [facility] If pt is not established with a provider, would they like to be referred to a provider to establish care? No .   Dental Screening: Recommended annual dental exams for proper oral hygiene  Community Resource Referral / Chronic Care Management: CRR required this visit?  No   CCM required this visit?  No      Plan:     I have personally reviewed and noted the following in the patient's chart:   Medical and social history Use of  alcohol, tobacco or illicit drugs  Current medications and supplements including opioid prescriptions.  Functional ability and status Nutritional status Physical activity Advanced directives List of other physicians Hospitalizations, surgeries, and ER visits in previous 12 months Vitals Screenings to include cognitive, depression, and falls Referrals and appointments  In addition, I have reviewed and discussed with patient certain preventive protocols, quality metrics, and best practice recommendations. A written personalized care plan for preventive services as well as general preventive health recommendations were provided to patient.     Dionisio David, LPN   [id number]   Nurse Notes: none

## 2022-02-19 NOTE — Patient Instructions (Signed)
[name] , Thank you for taking time to come for your Medicare Wellness Visit. I appreciate your ongoing commitment to your health goals. Please review the following plan we discussed and let me know if I can assist you in the future.   Screening recommendations/referrals: Colonoscopy: aged out Mammogram: aged out Bone Density: has appt at [facility] Recommended yearly ophthalmology/optometry visit for glaucoma screening and checkup Recommended yearly dental visit for hygiene and checkup  Vaccinations: Influenza vaccine: 06/16/21 Pneumococcal vaccine: 04/18/14 Tdap vaccine: 05/25/11, due if have injury Shingles vaccine: Zostavax 04/19/10  Shingrix 04/14/20, needs 2nd shot   Covid-19:10/27/19, 11/17/19, 02/19/21  Advanced directives: no  Conditions/risks identified: none  Next appointment: Follow up in one year for your annual wellness visit 02/21/23 @ 2:30pm by phone   Preventive Care 84 Years and Older, Female Preventive care refers to lifestyle choices and visits with your health care provider that can promote health and wellness. What does preventive care include? A yearly physical exam. This is also called an annual well check. Dental exams once or twice a year. Routine eye exams. Ask your health care provider how often you should have your eyes checked. Personal lifestyle choices, including: Daily care of your teeth and gums. Regular physical activity. Eating a healthy diet. Avoiding tobacco and drug use. Limiting alcohol use. Practicing safe sex. Taking low-dose aspirin every day. Taking vitamin and mineral supplements as recommended by your health care provider. What happens during an annual well check? The services and screenings done by your health care provider during your annual well check will depend on your age, overall health, lifestyle risk factors, and family history of disease. Counseling  Your health care provider may ask you questions about your: Alcohol  use. Tobacco use. Drug use. Emotional well-being. Home and relationship well-being. Sexual activity. Eating habits. History of falls. Memory and ability to understand (cognition). Work and work Statistician. Reproductive health. Screening  You may have the following tests or measurements: Height, weight, and BMI. Blood pressure. Lipid and cholesterol levels. These may be checked every 5 years, or more frequently if you are over 24 years old. Skin check. Lung cancer screening. You may have this screening every year starting at age 84 if you have a 30-pack-year history of smoking and currently smoke or have quit within the past 15 years. Fecal occult blood test (FOBT) of the stool. You may have this test every year starting at age 84. Flexible sigmoidoscopy or colonoscopy. You may have a sigmoidoscopy every 5 years or a colonoscopy every 10 years starting at age 84. Hepatitis C blood test. Hepatitis B blood test. Sexually transmitted disease (STD) testing. Diabetes screening. This is done by checking your blood sugar (glucose) after you have not eaten for a while (fasting). You may have this done every 1-3 years. Bone density scan. This is done to screen for osteoporosis. You may have this done starting at age 84. Mammogram. This may be done every 1-2 years. Talk to your health care provider about how often you should have regular mammograms. Talk with your health care provider about your test results, treatment options, and if necessary, the need for more tests. Vaccines  Your health care provider may recommend certain vaccines, such as: Influenza vaccine. This is recommended every year. Tetanus, diphtheria, and acellular pertussis (Tdap, Td) vaccine. You may need a Td booster every 10 years. Zoster vaccine. You may need this after age 68. Pneumococcal 13-valent conjugate (PCV13) vaccine. One dose is recommended after age 84. Pneumococcal  polysaccharide (PPSV23) vaccine. One dose is  recommended after age 50. Talk to your health care provider about which screenings and vaccines you need and how often you need them. This information is not intended to replace advice given to you by your health care provider. Make sure you discuss any questions you have with your health care provider. Document Released: 09/15/2015 Document Revised: 05/08/2016 Document Reviewed: 06/20/2015 Elsevier Interactive Patient Education  2017 [place] Prevention in the Home Falls can cause injuries. They can happen to people of all ages. There are many things you can do to make your home safe and to help prevent falls. What can I do on the outside of my home? Regularly fix the edges of walkways and driveways and fix any cracks. Remove anything that might make you trip as you walk through a door, such as a raised step or threshold. Trim any bushes or trees on the path to your home. Use bright outdoor lighting. Clear any walking paths of anything that might make someone trip, such as rocks or tools. Regularly check to see if handrails are loose or broken. Make sure that both sides of any steps have handrails. Any raised decks and porches should have guardrails on the edges. Have any leaves, snow, or ice cleared regularly. Use sand or salt on walking paths during winter. Clean up any spills in your garage right away. This includes oil or grease spills. What can I do in the bathroom? Use night lights. Install grab bars by the toilet and in the tub and shower. Do not use towel bars as grab bars. Use non-skid mats or decals in the tub or shower. If you need to sit down in the shower, use a plastic, non-slip stool. Keep the floor dry. Clean up any water that spills on the floor as soon as it happens. Remove soap buildup in the tub or shower regularly. Attach bath mats securely with double-sided non-slip rug tape. Do not have throw rugs and other things on the floor that can make you  trip. What can I do in the bedroom? Use night lights. Make sure that you have a light by your bed that is easy to reach. Do not use any sheets or blankets that are too big for your bed. They should not hang down onto the floor. Have a firm chair that has side arms. You can use this for support while you get dressed. Do not have throw rugs and other things on the floor that can make you trip. What can I do in the kitchen? Clean up any spills right away. Avoid walking on wet floors. Keep items that you use a lot in easy-to-reach places. If you need to reach something above you, use a strong step stool that has a grab bar. Keep electrical cords out of the way. Do not use floor polish or wax that makes floors slippery. If you must use wax, use non-skid floor wax. Do not have throw rugs and other things on the floor that can make you trip. What can I do with my stairs? Do not leave any items on the stairs. Make sure that there are handrails on both sides of the stairs and use them. Fix handrails that are broken or loose. Make sure that handrails are as long as the stairways. Check any carpeting to make sure that it is firmly attached to the stairs. Fix any carpet that is loose or worn. Avoid having throw rugs at the top  or bottom of the stairs. If you do have throw rugs, attach them to the floor with carpet tape. Make sure that you have a light switch at the top of the stairs and the bottom of the stairs. If you do not have them, ask someone to add them for you. What else can I do to help prevent falls? Wear shoes that: Do not have high heels. Have rubber bottoms. Are comfortable and fit you well. Are closed at the toe. Do not wear sandals. If you use a stepladder: Make sure that it is fully opened. Do not climb a closed stepladder. Make sure that both sides of the stepladder are locked into place. Ask someone to hold it for you, if possible. Clearly mark and make sure that you can  see: Any grab bars or handrails. First and last steps. Where the edge of each step is. Use tools that help you move around (mobility aids) if they are needed. These include: Canes. Walkers. Scooters. Crutches. Turn on the lights when you go into a dark area. Replace any light bulbs as soon as they burn out. Set up your furniture so you have a clear path. Avoid moving your furniture around. If any of your floors are uneven, fix them. If there are any pets around you, be aware of where they are. Review your medicines with your doctor. Some medicines can make you feel dizzy. This can increase your chance of falling. Ask your doctor what other things that you can do to help prevent falls. This information is not intended to replace advice given to you by your health care provider. Make sure you discuss any questions you have with your health care provider. Document Released: 06/15/2009 Document Revised: 01/25/2016 Document Reviewed: 09/23/2014 Elsevier Interactive Patient Education  2017 [employer].

## 2022-02-21 ENCOUNTER — Telehealth: Payer: Self-pay

## 2022-02-21 NOTE — Progress Notes (Signed)
Called patient left message to call back. I have checked [facility] and no information on updated immunizations. Called local pharmacy and they only have one we have on file for patient. Do have documented that they asked about getting second one on 12/01/21 but patient declined.

## 2022-02-21 NOTE — Telephone Encounter (Signed)
Patient is returning a call to [facility].

## 2022-02-21 NOTE — Telephone Encounter (Signed)
Patient called office back. She has not got her second shingles vaccination. She is going to work on that. Patient was under the impression that she needs a Hep b, covid, and shingles. I seen in your note where she is due for pneumonia as well. Do you want me to call to make nurse visit for hep b and pneumonia.

## 2022-02-21 NOTE — Telephone Encounter (Signed)
It looks like she has an appointment scheduled with me for 02/26/22 so we can discuss then. Looks like she may be getting Hep B at GI office?

## 2022-02-26 ENCOUNTER — Ambulatory Visit (INDEPENDENT_AMBULATORY_CARE_PROVIDER_SITE_OTHER): Payer: Medicare HMO | Admitting: Primary Care

## 2022-02-26 ENCOUNTER — Encounter: Payer: Self-pay | Admitting: Primary Care

## 2022-02-26 VITALS — BP 130/74 | HR 78 | Temp 97.6°F | Ht <= 58 in | Wt 108.0 lb

## 2022-02-26 DIAGNOSIS — M8000XS Age-related osteoporosis with current pathological fracture, unspecified site, sequela: Secondary | ICD-10-CM | POA: Diagnosis not present

## 2022-02-26 DIAGNOSIS — M5416 Radiculopathy, lumbar region: Secondary | ICD-10-CM

## 2022-02-26 DIAGNOSIS — Z0000 Encounter for general adult medical examination without abnormal findings: Secondary | ICD-10-CM | POA: Diagnosis not present

## 2022-02-26 DIAGNOSIS — Z23 Encounter for immunization: Secondary | ICD-10-CM

## 2022-02-26 DIAGNOSIS — J84112 Idiopathic pulmonary fibrosis: Secondary | ICD-10-CM | POA: Diagnosis not present

## 2022-02-26 DIAGNOSIS — M1990 Unspecified osteoarthritis, unspecified site: Secondary | ICD-10-CM

## 2022-02-26 DIAGNOSIS — K5190 Ulcerative colitis, unspecified, without complications: Secondary | ICD-10-CM | POA: Diagnosis not present

## 2022-02-26 DIAGNOSIS — E7800 Pure hypercholesterolemia, unspecified: Secondary | ICD-10-CM

## 2022-02-26 DIAGNOSIS — G8929 Other chronic pain: Secondary | ICD-10-CM

## 2022-02-26 NOTE — Assessment & Plan Note (Signed)
Updated pneumonia vaccine. Encouraged to obtain Shingrix from the pharmacy. Declines mammogram given age. Bone density scan updated per patient. Colonoscopy reviewed from 2023.  Encouraged daily walking, healthy diet. Exam today stable, labs reviewed.

## 2022-02-26 NOTE — Assessment & Plan Note (Addendum)
Following with pulmonology through [facility] and also through [facility].  No longer managed on Pulmicort. Continue Esbriet 267 mg TID. Continue supplemental oxygen.

## 2022-02-26 NOTE — Assessment & Plan Note (Signed)
Following with orthopedics through

## 2022-02-27 NOTE — Addendum Note (Signed)
Addended by: Francella Solian on: 02/27/2022 12:19 PM   Modules accepted: Orders

## 2022-02-28 ENCOUNTER — Telehealth: Payer: Medicare HMO | Admitting: Gastroenterology

## 2022-02-28 ENCOUNTER — Other Ambulatory Visit: Payer: Self-pay

## 2022-02-28 DIAGNOSIS — Z23 Encounter for immunization: Secondary | ICD-10-CM | POA: Diagnosis not present

## 2022-02-28 DIAGNOSIS — M5416 Radiculopathy, lumbar region: Secondary | ICD-10-CM | POA: Diagnosis not present

## 2022-02-28 DIAGNOSIS — M25519 Pain in unspecified shoulder: Secondary | ICD-10-CM | POA: Diagnosis not present

## 2022-02-28 DIAGNOSIS — M25551 Pain in right hip: Secondary | ICD-10-CM | POA: Diagnosis not present

## 2022-02-28 DIAGNOSIS — G894 Chronic pain syndrome: Secondary | ICD-10-CM | POA: Diagnosis not present

## 2022-02-28 DIAGNOSIS — M19072 Primary osteoarthritis, left ankle and foot: Secondary | ICD-10-CM | POA: Diagnosis not present

## 2022-02-28 DIAGNOSIS — M19071 Primary osteoarthritis, right ankle and foot: Secondary | ICD-10-CM | POA: Diagnosis not present

## 2022-02-28 DIAGNOSIS — Z79899 Other long term (current) drug therapy: Secondary | ICD-10-CM | POA: Diagnosis not present

## 2022-02-28 DIAGNOSIS — M47896 Other spondylosis, lumbar region: Secondary | ICD-10-CM | POA: Diagnosis not present

## 2022-03-03 ENCOUNTER — Encounter: Payer: Self-pay | Admitting: Gastroenterology

## 2022-03-06 ENCOUNTER — Telehealth (INDEPENDENT_AMBULATORY_CARE_PROVIDER_SITE_OTHER): Payer: Medicare HMO | Admitting: Gastroenterology

## 2022-03-06 DIAGNOSIS — Z79899 Other long term (current) drug therapy: Secondary | ICD-10-CM

## 2022-03-06 DIAGNOSIS — K5190 Ulcerative colitis, unspecified, without complications: Secondary | ICD-10-CM

## 2022-03-06 DIAGNOSIS — Z789 Other specified health status: Secondary | ICD-10-CM

## 2022-03-06 MED ORDER — PREDNISONE 2.5 MG PO TABS: ORAL_TABLET | ORAL | 0 refills | Status: DC

## 2022-03-06 NOTE — Progress Notes (Signed)
[name] , MD [address]  [name]  [place], [place] [address]  Main: [phone]  Fax: [phone]   Primary Care Physician: [name], NP  Virtual Visit via Video Note  I connected with patient on 03/06/22 at 10:00 AM EDT by video and verified that I am speaking with the correct person using two identifiers.   I discussed the limitations, risks, security and privacy concerns of performing an evaluation and management service by video  and the availability of in person appointments. I also discussed with the patient that there may be a patient responsible charge related to this service. The patient expressed understanding and agreed to proceed.  Location of Patient: Home Location of Provider: Home Persons involved: Patient and provider only   History of Present Illness: Chief Complaint  Patient presents with   Ulcerative Colitis    HPI: [name] is a 86 y.o. female  Summary of history :   Since approximately early 2021 she has been on treatment with sulfasalazine for ulcerative pancolitis.  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  She was seen in 2018 by [facility] clinic GI  in 08/2008 she had a colonoscopy as per the last GI note showed chronic colitis with focal cryptitis.  Focal active colitis.   Not sure why it was not felt that this patient had inflammatory bowel disease.  At her initial visit she had been on Meloxicam for about 2-3  Months, 2 tablets a day and no other NSAID's.       Treated in  07/03/2020 for C. difficile diarrhea.  on 07/11/2020, 03/03/2021,  10/24/2021: S/p Stool transplant for c diff    08/31/2021: Flexible sigmoidoscopy  Normal mucosa was found in the left colon. Biopsies were taken with a cold forceps for histology. Biopsies showed mild chronic proctocolitis.   11/12/2021: mild esophageal dysmotility   11/22/2021: EGD: Normal : bx of esophagus normal  12/10/2021: Stool for PCR and  c diff  negative for infection  01/17/2022: CT abdomen : no acute abnormalities 02/01/2022: Colonoscopy: Patchy moderate mucosal changes characterized by mucus and serpentine ulcerations were found in the entire colon. Rectum appeared spared, unable to intubate terminal ileum. Biopsies showed  moderate chronic active colitis. CMV negative.  02/04/2022: Hb 11.7 , CRP 49 , TB quant negative, Hep A ab +ve , needs hep b vaccine,  02/14/2022: MR enterogram :Mucosal hyperenhancement of the descending and sigmoid colon compatible with colitis         Interval history 02/18/2022-03/06/2022   Still has loose stools, no blood, very little, no worse since last visit On prednisone 10 mg from yesterdays   She is doing well.  She is taking 25 mg of prednisone daily.  Having 4-5 bowel movements a day more formed.  No rectal bleeding.  No abdominal pain.   She had pneumonia, hep B 1/3 shots, shingles this week .   Current Outpatient Medications  Medication Sig Dispense Refill   predniSONE (DELTASONE) 2.5 MG tablet Take 2 tablets daily for 1 week, then 1 tablet daily for 1 week and then 1 tablet every other day for 4 doses then stop 25 tablet 0   Biotin 10 MG CAPS Take 1 capsule by mouth daily. (Patient not taking: Reported on 02/19/2022)     citalopram (CELEXA) 40 MG tablet Take 40 mg by mouth daily.     denosumab (PROLIA) 60 MG/ML SOSY injection Inject 60 mg into the skin every  6 (six) months.     ESBRIET 267 MG TABS Take 267 mg by mouth 3 (three) times daily with meals.     HYDROmorphone (DILAUDID) 2 MG tablet Take 2 mg by mouth every 4 (four) hours as needed.     Magnesium 500 MG CAPS Take 1 capsule by mouth as needed. (Patient not taking: Reported on 02/19/2022)     OXYGEN Inhale 2 L into the lungs at bedtime.     predniSONE (DELTASONE) 10 MG tablet Take 40 mg a day for a week , then 30 mg a day for 5 days, 25 mg a day for 5 days, 20 mg a day for 5 days, 15 mg a day for 5 days, 10 mg a day for 5 days, 5 mg a day for  5 days, 2.5 mg a day every other day for 3 doses then stop. (Patient not taking: Reported on 02/19/2022) 80 tablet 0   vitamin B-12 (CYANOCOBALAMIN) 1000 MCG tablet Take 1,000 mcg by mouth every other day. (Patient not taking: Reported on 02/19/2022)     VOLTAREN 1 % GEL Apply 2 g topically in the morning, at noon, and at bedtime. Shoulders and back  2   No current facility-administered medications for this visit.    Allergies as of 03/06/2022   (No Known Allergies)    Review of Systems:    All systems reviewed and negative except where noted in HPI.  General Appearance:    Alert, cooperative, no distress, appears stated age  Head:    Normocephalic, without obvious abnormality, atraumatic  Eyes:    PERRL, conjunctiva/corneas clear,  Ears:    Grossly normal hearing    Neurologic:  Grossly normal    Observations/Objective:  Labs: CMP     Component Value Date/Time   NA 139 04/21/2021 0409   NA 139 09/21/2020 1320   NA 139 02/09/2014 0500   K 4.4 04/21/2021 0409   K 3.2 (L) 02/09/2014 0500   CL 106 04/21/2021 0409   CL 106 02/09/2014 0500   CO2 28 04/21/2021 0409   CO2 27 02/09/2014 0500   GLUCOSE 126 (H) 04/21/2021 0409   GLUCOSE 85 02/09/2014 0500   BUN 24 (H) 04/21/2021 0409   BUN 19 09/21/2020 1320   BUN 11 02/09/2014 0500   CREATININE 0.80 10/19/2021 1548   CREATININE 0.78 02/09/2014 0500   CALCIUM 9.2 04/21/2021 0409   CALCIUM 8.8 02/09/2014 0500   PROT 7.0 04/21/2021 0409   PROT 6.7 08/01/2020 1419   PROT 6.7 02/08/2014 1247   ALBUMIN 3.8 04/21/2021 0409   ALBUMIN 3.9 08/01/2020 1419   ALBUMIN 3.2 (L) 02/08/2014 1247   AST 36 04/21/2021 0409   AST 35 02/08/2014 1247   ALT 25 04/21/2021 0409   ALT 23 02/08/2014 1247   ALKPHOS 69 04/21/2021 0409   ALKPHOS 101 02/08/2014 1247   BILITOT 0.6 04/21/2021 0409   BILITOT 0.4 08/01/2020 1419   BILITOT 0.7 02/08/2014 1247   GFRNONAA >60 04/21/2021 0409   GFRNONAA >60 02/09/2014 0500   GFRAA 76 09/21/2020 1320    GFRAA >60 02/09/2014 0500   Lab Results  Component Value Date   WBC 13.5 (H) 02/04/2022   HGB 11.7 02/04/2022   HCT 35.9 02/04/2022   MCV 88 02/04/2022   PLT 457 (H) 02/04/2022    Imaging Studies: MR ENTERO PELVIS W WO CONTRAST  Result Date: 02/15/2022 CLINICAL DATA:  A female at age 31 presents for evaluation of ulcerative colitis and diarrhea. EXAM:  MR ABDOMEN AND PELVIS WITHOUT AND WITH CONTRAST (MR ENTEROGRAPHY) TECHNIQUE: Multiplanar, multisequence MRI of the abdomen and pelvis was performed both before and during bolus administration of intravenous contrast. Negative oral contrast VoLumen was given. CONTRAST:  39m GADAVIST GADOBUTROL 1 MMOL/ML IV SOLN COMPARISON:  CT of the abdomen and pelvis of Jan 17, 2022. FINDINGS: COMBINED FINDINGS FOR BOTH MR ABDOMEN AND PELVIS Lower chest: Heart size is enlarged. There is evidence of small amounts of pleural fluid in the LEFT and RIGHT chest. Basilar atelectasis. Interstitial lung disease seen on the prior chest CT is better characterized on previous chest imaging and abdominal imaging with CT. Hepatobiliary: No focal, suspicious hepatic lesion. The portal vein is patent. Hepatic veins are patent. No biliary duct distension following cholecystectomy. Pancreas: No signs of pancreatic inflammation or ductal dilation. The preservation of intrinsic T1 signal of the pancreas. Mild pancreatic atrophy. Spleen:  Normal. Adrenals/Urinary Tract:  Adrenal glands are normal. No hydronephrosis or perinephric stranding. No gross perivesical stranding though susceptibility artifact from RIGHT hip arthroplasty limits assessment of the pelvis. Susceptibility artifact also limited prior CT imaging. Stomach/Bowel: Mucosal hyperenhancement of the descending and sigmoid colon compatible with colitis though without substantial surrounding stranding on other sequences. No sign of pelvic abscess. Diverticular changes are noted but inflammation extends to the level of the splenic  flexure away from areas of diverticular change. Degree of thickening of the colon is less than on the previous CT. There was some hypervascularity in the adjacent pericolonic fat and this persists on the current study. Fluid-filled proximal colon and small bowel without signs of bowel obstruction, fistula or abscess. RIGHT colon with limited assessment in the area of the cecum due to susceptibility artifact. Vascular/Lymphatic: Vascular structures in the abdomen are grossly patent. No signs of aneurysmal dilation of the abdominal aorta. No signs of adenopathy in the abdomen or pelvis. Reproductive: Post hysterectomy. Other:  No ascites Musculoskeletal: Signs of cement augmentation of lumbar and lower thoracic vertebral bodies with evidence of prior compression fractures similar to prior imaging. IMPRESSION: 1. Mucosal hyperenhancement of the descending and sigmoid colon compatible with colitis though without substantial surrounding stranding on other sequences. Degree of thickening of the colon is less than on the previous CT. Distribution of colitis favors an infectious or inflammatory colitis over simple diverticulitis though there are diverticular changes in the sigmoid. 2. Hypervascularity in the adjacent pericolonic fat adjacent to affect the descending and sigmoid colon compatible with the "comb sign" another marker of inflammation. 3. No signs of bowel obstruction, fistula or abscess. 4. No signs of pelvic abscess. 5. Interstitial lung disease better characterized on previous CT imaging as is nephrolithiasis. There may also be trace bilateral effusions. Electronically Signed   By: GZetta BillsM.D.   On: 02/15/2022 17:04   MR ENTERO ABDOMEN W WO CONTRAST  Result Date: 02/15/2022 CLINICAL DATA:  A female at age [age]presents for evaluation of ulcerative colitis and diarrhea. EXAM: MR ABDOMEN AND PELVIS WITHOUT AND WITH CONTRAST (MR ENTEROGRAPHY) TECHNIQUE: Multiplanar, multisequence MRI of the abdomen and  pelvis was performed both before and during bolus administration of intravenous contrast. Negative oral contrast VoLumen was given. CONTRAST:  530mGADAVIST GADOBUTROL 1 MMOL/ML IV SOLN COMPARISON:  CT of the abdomen and pelvis of Jan 17, 2022. FINDINGS: COMBINED FINDINGS FOR BOTH MR ABDOMEN AND PELVIS Lower chest: Heart size is enlarged. There is evidence of small amounts of pleural fluid in the LEFT and RIGHT chest. Basilar atelectasis. Interstitial lung disease seen  on the prior chest CT is better characterized on previous chest imaging and abdominal imaging with CT. Hepatobiliary: No focal, suspicious hepatic lesion. The portal vein is patent. Hepatic veins are patent. No biliary duct distension following cholecystectomy. Pancreas: No signs of pancreatic inflammation or ductal dilation. The preservation of intrinsic T1 signal of the pancreas. Mild pancreatic atrophy. Spleen:  Normal. Adrenals/Urinary Tract:  Adrenal glands are normal. No hydronephrosis or perinephric stranding. No gross perivesical stranding though susceptibility artifact from RIGHT hip arthroplasty limits assessment of the pelvis. Susceptibility artifact also limited prior CT imaging. Stomach/Bowel: Mucosal hyperenhancement of the descending and sigmoid colon compatible with colitis though without substantial surrounding stranding on other sequences. No sign of pelvic abscess. Diverticular changes are noted but inflammation extends to the level of the splenic flexure away from areas of diverticular change. Degree of thickening of the colon is less than on the previous CT. There was some hypervascularity in the adjacent pericolonic fat and this persists on the current study. Fluid-filled proximal colon and small bowel without signs of bowel obstruction, fistula or abscess. RIGHT colon with limited assessment in the area of the cecum due to susceptibility artifact. Vascular/Lymphatic: Vascular structures in the abdomen are grossly patent. No signs  of aneurysmal dilation of the abdominal aorta. No signs of adenopathy in the abdomen or pelvis. Reproductive: Post hysterectomy. Other:  No ascites Musculoskeletal: Signs of cement augmentation of lumbar and lower thoracic vertebral bodies with evidence of prior compression fractures similar to prior imaging. IMPRESSION: 1. Mucosal hyperenhancement of the descending and sigmoid colon compatible with colitis though without substantial surrounding stranding on other sequences. Degree of thickening of the colon is less than on the previous CT. Distribution of colitis favors an infectious or inflammatory colitis over simple diverticulitis though there are diverticular changes in the sigmoid. 2. Hypervascularity in the adjacent pericolonic fat adjacent to affect the descending and sigmoid colon compatible with the "comb sign" another marker of inflammation. 3. No signs of bowel obstruction, fistula or abscess. 4. No signs of pelvic abscess. 5. Interstitial lung disease better characterized on previous CT imaging as is nephrolithiasis. There may also be trace bilateral effusions. Electronically Signed   By: Zetta Bills M.D.   On: 02/15/2022 16:24    Assessment and Plan:   [name] is a 86 y.o. y/o female here to follow-up for chronic ulcerative colitis and has been doing well on sulfasalazine with no other GI symptoms.  Recent history of recurrent C. difficile colitis treated with vancomycin in 2021 and in November 2022 treated with Dificid and stool transplant in 10/2021 .  She is up-to-date with her tetanus, influenza, DEXA scan and pneumococcal vaccination.  Health maintenance indicates she she has received Shingrix dose in 04/21/2020 and COVID vaccination.  Doing well awaiting to start Entyvio.  Vaccinations proceeding with satisfaction   .   Plan Complete series of hepatitis B vaccination, proceed with Shingrix vaccine that she has scheduled for next week Commence on Entyvio which she says is due  in [place] of prednisone gradually presently on 10 mg a day which she will take for a week then 5 mg for 1 week then 2.5 mg for 1 week then 2.5 mg every other day for 4 doses then stop Telephone visit or video visit in 10 days     I discussed the assessment and treatment plan with the patient. The patient was provided an opportunity to ask questions and all were answered. The patient agreed with the plan  and demonstrated an understanding of the instructions.   The patient was advised to call back or seek an in-person evaluation if the symptoms worsen or if the condition fails to improve as anticipated.  I provided 16 minutes of face-to-face time during this encounter.  Dr Terri Bellows MD,MRCP [facility]) Gastroenterology/Hepatology Pager: [phone]   Speech recognition software was used to dictate this note.

## 2022-03-11 DIAGNOSIS — M47817 Spondylosis without myelopathy or radiculopathy, lumbosacral region: Secondary | ICD-10-CM | POA: Diagnosis not present

## 2022-03-14 ENCOUNTER — Encounter: Payer: Self-pay | Admitting: Gastroenterology

## 2022-03-15 ENCOUNTER — Other Ambulatory Visit: Payer: Self-pay | Admitting: Gastroenterology

## 2022-03-15 ENCOUNTER — Other Ambulatory Visit (INDEPENDENT_AMBULATORY_CARE_PROVIDER_SITE_OTHER): Payer: Medicare HMO

## 2022-03-16 DIAGNOSIS — J449 Chronic obstructive pulmonary disease, unspecified: Secondary | ICD-10-CM | POA: Diagnosis not present

## 2022-03-19 ENCOUNTER — Encounter: Payer: Self-pay | Admitting: Gastroenterology

## 2022-03-19 NOTE — Telephone Encounter (Signed)
Printed patient assistance form and filled out our portion of the form. Called patient and she will come by this week to fill out her portion of the form. Ginger has it on her desk since I will be out of the office the rest of the week

## 2022-03-28 ENCOUNTER — Telehealth (INDEPENDENT_AMBULATORY_CARE_PROVIDER_SITE_OTHER): Payer: Medicare HMO | Admitting: Gastroenterology

## 2022-03-28 DIAGNOSIS — R0602 Shortness of breath: Secondary | ICD-10-CM | POA: Diagnosis not present

## 2022-03-28 DIAGNOSIS — K5190 Ulcerative colitis, unspecified, without complications: Secondary | ICD-10-CM | POA: Diagnosis not present

## 2022-03-28 DIAGNOSIS — J8410 Pulmonary fibrosis, unspecified: Secondary | ICD-10-CM | POA: Diagnosis not present

## 2022-03-28 NOTE — Progress Notes (Signed)
[name] , MD [address]  [place]  [name], [name] [address]  Main: [phone]  Fax: [phone]   Primary Care Physician: [name], NP  Virtual Visit via Video Note  I connected with patient on 03/28/22 at  2:45 PM EDT by video and verified that I am speaking with the correct person using two identifiers.   I discussed the limitations, risks, security and privacy concerns of performing an evaluation and management service by video  and the availability of in person appointments. I also discussed with the patient that there may be a patient responsible charge related to this service. The patient expressed understanding and agreed to proceed.  Location of Patient: Home Location of Provider: Home Persons involved: Patient and provider only   History of Present Illness: Chief Complaint  Patient presents with   Ulcerative Colitis    HPI: [name] is a 86 y.o. female   Summary of history :   Since approximately early 2021 she has been on treatment with sulfasalazine for ulcerative pancolitis.  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  She was seen in 2018 by [facility] clinic GI  in 08/2008 she had a colonoscopy as per the last GI note showed chronic colitis with focal cryptitis.  Focal active colitis.   Not sure why it was not felt that this patient had inflammatory bowel disease.  At her initial visit she had been on Meloxicam for about 2-3  Months, 2 tablets a day and no other NSAID's.       Treated in  07/03/2020 for C. difficile diarrhea.  on 07/11/2020, 03/03/2021,  10/24/2021: S/p Stool transplant for c diff    08/31/2021: Flexible sigmoidoscopy  Normal mucosa was found in the left colon. Biopsies were taken with a cold forceps for histology. Biopsies showed mild chronic proctocolitis.   11/12/2021: mild esophageal dysmotility   11/22/2021: EGD: Normal : bx of esophagus normal  02/01/2022: Colonoscopy: Patchy moderate  mucosal changes characterized by mucus and serpentine ulcerations were found in the entire colon. Rectum appeared spared, unable to intubate terminal ileum. Biopsies showed  moderate chronic active colitis. CMV negative.  02/04/2022: Hb 11.7 , CRP 49 , TB quant negative, Hep A ab +ve , needs hep b vaccine,  02/14/2022: MR enterogram :Mucosal hyperenhancement of the descending and sigmoid colon compatible with colitis         Interval history  03/06/2022-03/28/2022   Still has loose stools, no blood, very little, no worse since last visit On prednisone 10 mg from yesterdays  Still has not started Entyvio - didn't receive the forms.   Tomorrow she starts on prednisone every other day for 4 doses then stop.   Bowel movements are better- no blood      Current Outpatient Medications  Medication Sig Dispense Refill   Biotin 10 MG CAPS Take 1 capsule by mouth daily. (Patient not taking: Reported on 02/19/2022)     citalopram (CELEXA) 40 MG tablet Take 40 mg by mouth daily.     denosumab (PROLIA) 60 MG/ML SOSY injection Inject 60 mg into the skin every 6 (six) months.     ESBRIET 267 MG TABS Take 267 mg by mouth 3 (three) times daily with meals.     famotidine (PEPCID) 20 MG tablet Take 20 mg by mouth daily.     HYDROcodone-acetaminophen (NORCO/VICODIN) 5-325 MG tablet Take 1 tablet by mouth 3 (three) times daily as needed.  Magnesium 500 MG CAPS Take 1 capsule by mouth as needed. (Patient not taking: Reported on 02/19/2022)     OXYGEN Inhale 2 L into the lungs at bedtime.     predniSONE (DELTASONE) 2.5 MG tablet Take 2 tablets daily for 1 week, then 1 tablet daily for 1 week and then 1 tablet every other day for 4 doses then stop 25 tablet 0   vitamin B-12 (CYANOCOBALAMIN) 1000 MCG tablet Take 1,000 mcg by mouth every other day. (Patient not taking: Reported on 02/19/2022)     VOLTAREN 1 % GEL Apply 2 g topically in the morning, at noon, and at bedtime. Shoulders and back  2   No current  facility-administered medications for this visit.    Allergies as of 03/28/2022   (No Known Allergies)    Review of Systems:    All systems reviewed and negative except where noted in HPI.  General Appearance:    Alert, cooperative, no distress, appears stated age  Head:    Normocephalic, without obvious abnormality, atraumatic  Eyes:    PERRL, conjunctiva/corneas clear,  Ears:    Grossly normal hearing    Neurologic:  Grossly normal    Observations/Objective:  Labs: CMP     Component Value Date/Time   NA 139 04/21/2021 0409   NA 139 09/21/2020 1320   NA 139 02/09/2014 0500   K 4.4 04/21/2021 0409   K 3.2 (L) 02/09/2014 0500   CL 106 04/21/2021 0409   CL 106 02/09/2014 0500   CO2 28 04/21/2021 0409   CO2 27 02/09/2014 0500   GLUCOSE 126 (H) 04/21/2021 0409   GLUCOSE 85 02/09/2014 0500   BUN 24 (H) 04/21/2021 0409   BUN 19 09/21/2020 1320   BUN 11 02/09/2014 0500   CREATININE 0.80 10/19/2021 1548   CREATININE 0.78 02/09/2014 0500   CALCIUM 9.2 04/21/2021 0409   CALCIUM 8.8 02/09/2014 0500   PROT 7.0 04/21/2021 0409   PROT 6.7 08/01/2020 1419   PROT 6.7 02/08/2014 1247   ALBUMIN 3.8 04/21/2021 0409   ALBUMIN 3.9 08/01/2020 1419   ALBUMIN 3.2 (L) 02/08/2014 1247   AST 36 04/21/2021 0409   AST 35 02/08/2014 1247   ALT 25 04/21/2021 0409   ALT 23 02/08/2014 1247   ALKPHOS 69 04/21/2021 0409   ALKPHOS 101 02/08/2014 1247   BILITOT 0.6 04/21/2021 0409   BILITOT 0.4 08/01/2020 1419   BILITOT 0.7 02/08/2014 1247   GFRNONAA >60 04/21/2021 0409   GFRNONAA >60 02/09/2014 0500   GFRAA 76 09/21/2020 1320   GFRAA >60 02/09/2014 0500   Lab Results  Component Value Date   WBC 13.5 (H) 02/04/2022   HGB 11.7 02/04/2022   HCT 35.9 02/04/2022   MCV 88 02/04/2022   PLT 457 (H) 02/04/2022    Imaging Studies: No results found.  Assessment and Plan:   [name] is a 86 y.o. y/o female here to follow-up for chronic ulcerative colitis failed sulfasalazine   Recent history of recurrent C. difficile colitis treated with vancomycin in 2021 and in November 2022 treated with Dificid and stool transplant in 10/2021 .  She is up-to-date with her tetanus, influenza, DEXA scan and pneumococcal vaccination.  Health maintenance indicates she she has received Shingrix dose in 04/21/2020 and COVID vaccination.  Doing well awaiting to start Entyvio.  Vaccinations proceeding with satisfaction   .   Plan Complete series of hepatitis B vaccination, proceed with Shingrix vaccine that she has scheduled for next week I  will find out with my staff where her [facility] authorization process is stuck at.  I told her that I was very upset that she had not yet received her first doses of Entyvio despite trying to obtain it for over 8 weeks.  I am concerned she may have breakthrough once the effects of the steroids wean off.  She must get her Entyvio within the week or 10 days from today.  I will have my office figure out where things are at.  I have advised to message me if she does not have a date within the next few days scheduled for her Entyvio. Complete last 4 doses of prednisone every other day and then stop Telephone visit or video visit in 10 days        I discussed the assessment and treatment plan with the patient. The patient was provided an opportunity to ask questions and all were answered. The patient agreed with the plan and demonstrated an understanding of the instructions.   The patient was advised to call back or seek an in-person evaluation if the symptoms worsen or if the condition fails to improve as anticipated.  I provided 15 minutes of face-to-face time during this encounter.  Dr Terri Bellows MD,MRCP [facility]) Gastroenterology/Hepatology Pager: [phone]   Speech recognition software was used to dictate this note.

## 2022-03-29 DIAGNOSIS — J440 Chronic obstructive pulmonary disease with acute lower respiratory infection: Secondary | ICD-10-CM | POA: Diagnosis not present

## 2022-04-03 ENCOUNTER — Encounter: Payer: Self-pay | Admitting: Gastroenterology

## 2022-04-04 DIAGNOSIS — K5190 Ulcerative colitis, unspecified, without complications: Secondary | ICD-10-CM | POA: Diagnosis not present

## 2022-04-04 DIAGNOSIS — Z8619 Personal history of other infectious and parasitic diseases: Secondary | ICD-10-CM | POA: Diagnosis not present

## 2022-04-10 DIAGNOSIS — M47896 Other spondylosis, lumbar region: Secondary | ICD-10-CM | POA: Diagnosis not present

## 2022-04-10 DIAGNOSIS — M19071 Primary osteoarthritis, right ankle and foot: Secondary | ICD-10-CM | POA: Diagnosis not present

## 2022-04-10 DIAGNOSIS — M25519 Pain in unspecified shoulder: Secondary | ICD-10-CM | POA: Diagnosis not present

## 2022-04-10 DIAGNOSIS — G894 Chronic pain syndrome: Secondary | ICD-10-CM | POA: Diagnosis not present

## 2022-04-10 DIAGNOSIS — Z79899 Other long term (current) drug therapy: Secondary | ICD-10-CM | POA: Diagnosis not present

## 2022-04-10 DIAGNOSIS — M5416 Radiculopathy, lumbar region: Secondary | ICD-10-CM | POA: Diagnosis not present

## 2022-04-10 DIAGNOSIS — M25551 Pain in right hip: Secondary | ICD-10-CM | POA: Diagnosis not present

## 2022-04-10 DIAGNOSIS — M19072 Primary osteoarthritis, left ankle and foot: Secondary | ICD-10-CM | POA: Diagnosis not present

## 2022-04-14 ENCOUNTER — Encounter: Payer: Self-pay | Admitting: Gastroenterology

## 2022-04-17 DIAGNOSIS — Z8619 Personal history of other infectious and parasitic diseases: Secondary | ICD-10-CM | POA: Diagnosis not present

## 2022-04-17 DIAGNOSIS — K5190 Ulcerative colitis, unspecified, without complications: Secondary | ICD-10-CM | POA: Diagnosis not present

## 2022-04-22 ENCOUNTER — Telehealth: Payer: Self-pay

## 2022-04-22 ENCOUNTER — Encounter: Payer: Self-pay | Admitting: Gastroenterology

## 2022-04-22 ENCOUNTER — Ambulatory Visit: Payer: Medicare HMO | Admitting: Gastroenterology

## 2022-04-22 VITALS — BP 119/73 | HR 70 | Temp 98.2°F | Ht <= 58 in | Wt 109.4 lb

## 2022-04-22 DIAGNOSIS — K5190 Ulcerative colitis, unspecified, without complications: Secondary | ICD-10-CM | POA: Diagnosis not present

## 2022-04-22 DIAGNOSIS — Z79899 Other long term (current) drug therapy: Secondary | ICD-10-CM

## 2022-04-22 MED ORDER — OMEPRAZOLE 40 MG PO CPDR: 40.0000 mg | DELAYED_RELEASE_CAPSULE | Freq: Every day | ORAL | 3 refills | Status: AC

## 2022-04-22 NOTE — Progress Notes (Signed)
[name] MD, MRCP(U.K) [address]  [place]  [place], [place] [address]  Main: [phone]  Fax: [phone]   Primary Care Physician: [name], NP  Primary Gastroenterologist:  Dr. [name]   Chief Complaint  Patient presents with   Ulcerative Colitis         HPI: [name] is a 86 y.o. female Summary of history :   Since approximately early 2021 she has been on treatment with sulfasalazine for ulcerative pancolitis.  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  She was seen in 2018 by [facility] GI  in 08/2008 she had a colonoscopy as per the last GI note showed chronic colitis with focal cryptitis.  Focal active colitis.   Not sure why it was not felt that this patient had inflammatory bowel disease.  At her initial visit she had been on Meloxicam for about 2-3  Months, 2 tablets a day and no other NSAID's.       Treated in  07/03/2020 for C. difficile diarrhea.  on 07/11/2020, 03/03/2021,  10/24/2021: S/p Stool transplant for c diff    08/31/2021: Flexible sigmoidoscopy  Normal mucosa was found in the left colon. Biopsies were taken with a cold forceps for histology. Biopsies showed mild chronic proctocolitis.   11/12/2021: mild esophageal dysmotility   11/22/2021: EGD: Normal : bx of esophagus normal  02/01/2022: Colonoscopy: Patchy moderate mucosal changes characterized by mucus and serpentine ulcerations were found in the entire colon. Rectum appeared spared, unable to intubate terminal ileum. Biopsies showed  moderate chronic active colitis. CMV negative.  02/04/2022: Hb 11.7 , CRP 49 , TB quant negative, Hep A ab +ve , needs hep b vaccine,  02/14/2022: MR enterogram :Mucosal hyperenhancement of the descending and sigmoid colon compatible with colitis         Interval history  03/28/2022-04/22/2022   She has completed 2 doses of Entyvio started in July 2023.  She states that after the Entyvio infusion gets abdominal  cramping and headaches denies any itching denies any rash denies any shortness of breath she was unsure what to do next she has put off her third dose of Entyvio until we can decide further not on any steroids presently.  Denies any NSAID use.  She complains of bowel movements when she eats but are usually formed denies any blood in the stool 1-2 bowel movements a day presently.   Current Outpatient Medications  Medication Sig Dispense Refill   Biotin 10 MG CAPS Take 1 capsule by mouth daily.     citalopram (CELEXA) 40 MG tablet Take 40 mg by mouth daily.     denosumab (PROLIA) 60 MG/ML SOSY injection Inject 60 mg into the skin every 6 (six) months.     ESBRIET 267 MG TABS Take 267 mg by mouth 3 (three) times daily with meals.     HYDROcodone-acetaminophen (NORCO/VICODIN) 5-325 MG tablet Take 1 tablet by mouth 3 (three) times daily as needed.     Magnesium 500 MG CAPS Take 1 capsule by mouth as needed.     OXYGEN Inhale 2 L into the lungs at bedtime.     vitamin B-12 (CYANOCOBALAMIN) 1000 MCG tablet Take 1,000 mcg by mouth every other day.     VOLTAREN 1 % GEL Apply 2 g topically in the morning, at noon, and at bedtime. Shoulders and back  2   No current facility-administered medications for this visit.    Allergies as  of 04/22/2022   (No Known Allergies)    ROS:  General: Negative for anorexia, weight loss, fever, chills, fatigue, weakness. ENT: Negative for hoarseness, difficulty swallowing , nasal congestion. CV: Negative for chest pain, angina, palpitations, dyspnea on exertion, peripheral edema.  Respiratory: Negative for dyspnea at rest, dyspnea on exertion, cough, sputum, wheezing.  GI: See history of present illness. GU:  Negative for dysuria, hematuria, urinary incontinence, urinary frequency, nocturnal urination.  Endo: Negative for unusual weight change.    Physical Examination:   BP 119/73 (BP Location: Left Arm, Patient Position: Sitting, Cuff Size: Normal)   Pulse 70    Temp 98.2 F (36.8 C) (Oral)   Ht 4' 10"  (1.473 m)   Wt 109 lb 6 oz (49.6 kg)   BMI 22.86 kg/m   General: Well-nourished, well-developed in no acute distress.  Eyes: No icterus. Conjunctivae pink. Neuro: Alert and oriented x 3.  Grossly intact. Skin: Warm and dry, no jaundice.   Psych: Alert and cooperative, normal mood and affect.   Imaging Studies: No results found.  Assessment and Plan:   [name] is a 86 y.o. y/o female  here to follow-up for chronic ulcerative colitis failed sulfasalazine  Recent history of recurrent C. difficile colitis treated with vancomycin in 2021 and in November 2022 treated with Dificid and stool transplant in 10/2021 .  She is up-to-date with her tetanus, influenza, DEXA scan and pneumococcal vaccination.  Health maintenance indicates she she has received Shingrix dose in 04/21/2020 and COVID vaccination.  Completed 2 doses of Entyvio concern if she therapy her reaction to [facility] in terms of symptoms such as headache and abdominal cramping denies any symptoms of anaphylaxis such as skin rash, itching or difficulty breathing.  Overall from the ulcerative colitis point of view she is having only 1-2 bowel movements per day nonbloody and formed which indicates that she is responding to treatment.      Plan Proceed with the third dose of Entyvio but will recommend to give 25 mg of Benadryl prior to the procedure and 325 mg of Tylenol as well.  Also would recommend to slow down the rate of infusion.  I explained to her we only have 2 options at this point of time 1 would be to proceed with Entyvio with premedication and another option would be to change the medication.  Her symptoms are very mild in terms of side effect profile and I believe we should try to treated and continue with anterior which is a very good medication with a very good safety profile particularly in patients of her age. Check labs today At next visit if she still having cramping after  meals which she has presently we will consider a course of Benadryl 4.  Commenced on Prilosec 40 mg once a day          Dr [name]  MD,MRCP [facility]) Follow up in 7 to 10 days video visit

## 2022-04-22 NOTE — Telephone Encounter (Signed)
Called [facility] at [phone] and asked to speak to the pharmacist to let him know what Dr. Vicente Males wanted to add for the patient's pre-infusion medications: Benadryl 25 MG and Tylenol 325 MG. I also told him that Dr. Vicente Males wanted to slow down her infusion and the pharmacist stated that they will slow it down to 1 hour instead of 30 minutes.

## 2022-04-23 ENCOUNTER — Ambulatory Visit: Payer: Medicare HMO | Admitting: Gastroenterology

## 2022-04-24 DIAGNOSIS — M5416 Radiculopathy, lumbar region: Secondary | ICD-10-CM | POA: Diagnosis not present

## 2022-04-24 NOTE — Telephone Encounter (Signed)
I spoke with pt and pts husband; on 04/23/22 pt had episode of about 10 - 15 mins that pt was shaking and shivering but pts skin was not cold and pt did not have fever. Pulse ox went to 70 and P 108 but pt said her battery went out of oxygen machine and pt is on 3 L oxygen 24/7. Pt said had shivering like that x 2 before but pt cannot remember how long ago that was. Pt not having any CP, no more SOB than usual;no dizziness, fever, or wheezing. Pt said she is not diabetic. No sure what caused her to have shaking and shivering.,  Right now pt feels pretty good; Now BP 114/69 and P 85 and pulse ox is 95% with pt being on 3 L oxygen.offered pt appt for 04/25/22 but pt declined; pt is going for MRI this afternoon and pt has entyvio infusion scheduled on 04/25/22 at 10 AM. Pt said since she is feeling her normal now she does not want to schedule appt and request note to go to [name] NP to see what she thinks might have caused the shaking and shivering on 02/21/22. Pt request cb after reviewed by Anda Kraft. Sending note to [name] NP and [facility] CMA.

## 2022-04-29 ENCOUNTER — Encounter: Payer: Self-pay | Admitting: Gastroenterology

## 2022-04-29 DIAGNOSIS — J440 Chronic obstructive pulmonary disease with acute lower respiratory infection: Secondary | ICD-10-CM | POA: Diagnosis not present

## 2022-04-30 ENCOUNTER — Other Ambulatory Visit: Payer: Self-pay

## 2022-04-30 DIAGNOSIS — Z8619 Personal history of other infectious and parasitic diseases: Secondary | ICD-10-CM

## 2022-04-30 DIAGNOSIS — R197 Diarrhea, unspecified: Secondary | ICD-10-CM

## 2022-05-01 ENCOUNTER — Encounter: Payer: Self-pay | Admitting: Gastroenterology

## 2022-05-01 ENCOUNTER — Inpatient Hospital Stay: Payer: Medicare HMO

## 2022-05-01 ENCOUNTER — Other Ambulatory Visit: Payer: Self-pay

## 2022-05-01 ENCOUNTER — Emergency Department: Payer: Medicare HMO

## 2022-05-01 ENCOUNTER — Inpatient Hospital Stay
Admission: EM | Admit: 2022-05-01 | Discharge: 2022-05-23 | DRG: 270 | Disposition: A | Discharge status: Hospice | Payer: Medicare HMO | Attending: Internal Medicine | Admitting: Internal Medicine

## 2022-05-01 ENCOUNTER — Inpatient Hospital Stay
Admit: 2022-05-01 | Discharge: 2022-05-01 | Disposition: A | Discharge status: Home / Self Care | Payer: Medicare HMO | Attending: Obstetrics and Gynecology | Admitting: Obstetrics and Gynecology

## 2022-05-01 DIAGNOSIS — I4891 Unspecified atrial fibrillation: Secondary | ICD-10-CM | POA: Diagnosis not present

## 2022-05-01 DIAGNOSIS — N179 Acute kidney failure, unspecified: Secondary | ICD-10-CM | POA: Diagnosis not present

## 2022-05-01 DIAGNOSIS — J849 Interstitial pulmonary disease, unspecified: Secondary | ICD-10-CM | POA: Diagnosis not present

## 2022-05-01 DIAGNOSIS — Z515 Encounter for palliative care: Secondary | ICD-10-CM | POA: Diagnosis not present

## 2022-05-01 DIAGNOSIS — Z96641 Presence of right artificial hip joint: Secondary | ICD-10-CM | POA: Diagnosis present

## 2022-05-01 DIAGNOSIS — E873 Alkalosis: Secondary | ICD-10-CM | POA: Diagnosis not present

## 2022-05-01 DIAGNOSIS — Z96642 Presence of left artificial hip joint: Secondary | ICD-10-CM | POA: Diagnosis not present

## 2022-05-01 DIAGNOSIS — I5033 Acute on chronic diastolic (congestive) heart failure: Secondary | ICD-10-CM | POA: Diagnosis present

## 2022-05-01 DIAGNOSIS — K5190 Ulcerative colitis, unspecified, without complications: Secondary | ICD-10-CM | POA: Diagnosis present

## 2022-05-01 DIAGNOSIS — Z79620 Long term (current) use of immunosuppressive biologic: Secondary | ICD-10-CM

## 2022-05-01 DIAGNOSIS — K922 Gastrointestinal hemorrhage, unspecified: Secondary | ICD-10-CM | POA: Diagnosis not present

## 2022-05-01 DIAGNOSIS — I2699 Other pulmonary embolism without acute cor pulmonale: Secondary | ICD-10-CM | POA: Diagnosis not present

## 2022-05-01 DIAGNOSIS — I4892 Unspecified atrial flutter: Secondary | ICD-10-CM | POA: Diagnosis not present

## 2022-05-01 DIAGNOSIS — R339 Retention of urine, unspecified: Secondary | ICD-10-CM | POA: Diagnosis not present

## 2022-05-01 DIAGNOSIS — E876 Hypokalemia: Secondary | ICD-10-CM | POA: Diagnosis present

## 2022-05-01 DIAGNOSIS — J84112 Idiopathic pulmonary fibrosis: Secondary | ICD-10-CM | POA: Diagnosis present

## 2022-05-01 DIAGNOSIS — D62 Acute posthemorrhagic anemia: Secondary | ICD-10-CM | POA: Diagnosis not present

## 2022-05-01 DIAGNOSIS — R002 Palpitations: Secondary | ICD-10-CM | POA: Diagnosis not present

## 2022-05-01 DIAGNOSIS — Z96612 Presence of left artificial shoulder joint: Secondary | ICD-10-CM | POA: Diagnosis present

## 2022-05-01 DIAGNOSIS — I700 Atherosclerosis of aorta: Secondary | ICD-10-CM | POA: Diagnosis not present

## 2022-05-01 DIAGNOSIS — Z800 Family history of malignant neoplasm of digestive organs: Secondary | ICD-10-CM

## 2022-05-01 DIAGNOSIS — J9811 Atelectasis: Secondary | ICD-10-CM | POA: Diagnosis not present

## 2022-05-01 DIAGNOSIS — R0902 Hypoxemia: Secondary | ICD-10-CM | POA: Diagnosis not present

## 2022-05-01 DIAGNOSIS — R1032 Left lower quadrant pain: Secondary | ICD-10-CM | POA: Diagnosis not present

## 2022-05-01 DIAGNOSIS — T45515A Adverse effect of anticoagulants, initial encounter: Secondary | ICD-10-CM | POA: Diagnosis not present

## 2022-05-01 DIAGNOSIS — J9602 Acute respiratory failure with hypercapnia: Secondary | ICD-10-CM | POA: Diagnosis not present

## 2022-05-01 DIAGNOSIS — J9621 Acute and chronic respiratory failure with hypoxia: Secondary | ICD-10-CM | POA: Diagnosis not present

## 2022-05-01 DIAGNOSIS — J9601 Acute respiratory failure with hypoxia: Secondary | ICD-10-CM | POA: Diagnosis not present

## 2022-05-01 DIAGNOSIS — G4730 Sleep apnea, unspecified: Secondary | ICD-10-CM | POA: Diagnosis present

## 2022-05-01 DIAGNOSIS — E7800 Pure hypercholesterolemia, unspecified: Secondary | ICD-10-CM | POA: Diagnosis not present

## 2022-05-01 DIAGNOSIS — E559 Vitamin D deficiency, unspecified: Secondary | ICD-10-CM | POA: Diagnosis present

## 2022-05-01 DIAGNOSIS — E861 Hypovolemia: Secondary | ICD-10-CM | POA: Diagnosis not present

## 2022-05-01 DIAGNOSIS — Z85828 Personal history of other malignant neoplasm of skin: Secondary | ICD-10-CM

## 2022-05-01 DIAGNOSIS — I10 Essential (primary) hypertension: Secondary | ICD-10-CM

## 2022-05-01 DIAGNOSIS — Z825 Family history of asthma and other chronic lower respiratory diseases: Secondary | ICD-10-CM

## 2022-05-01 DIAGNOSIS — Z8616 Personal history of COVID-19: Secondary | ICD-10-CM

## 2022-05-01 DIAGNOSIS — Z79899 Other long term (current) drug therapy: Secondary | ICD-10-CM

## 2022-05-01 DIAGNOSIS — D6832 Hemorrhagic disorder due to extrinsic circulating anticoagulants: Secondary | ICD-10-CM | POA: Diagnosis not present

## 2022-05-01 DIAGNOSIS — R918 Other nonspecific abnormal finding of lung field: Secondary | ICD-10-CM | POA: Diagnosis not present

## 2022-05-01 DIAGNOSIS — J189 Pneumonia, unspecified organism: Principal | ICD-10-CM

## 2022-05-01 DIAGNOSIS — J168 Pneumonia due to other specified infectious organisms: Secondary | ICD-10-CM | POA: Diagnosis not present

## 2022-05-01 DIAGNOSIS — Z66 Do not resuscitate: Secondary | ICD-10-CM | POA: Diagnosis not present

## 2022-05-01 DIAGNOSIS — R0600 Dyspnea, unspecified: Secondary | ICD-10-CM | POA: Diagnosis not present

## 2022-05-01 DIAGNOSIS — Z20822 Contact with and (suspected) exposure to covid-19: Secondary | ICD-10-CM | POA: Diagnosis not present

## 2022-05-01 DIAGNOSIS — E860 Dehydration: Secondary | ICD-10-CM | POA: Diagnosis not present

## 2022-05-01 DIAGNOSIS — M8580 Other specified disorders of bone density and structure, unspecified site: Secondary | ICD-10-CM | POA: Diagnosis present

## 2022-05-01 DIAGNOSIS — I491 Atrial premature depolarization: Secondary | ICD-10-CM | POA: Diagnosis not present

## 2022-05-01 DIAGNOSIS — I110 Hypertensive heart disease with heart failure: Secondary | ICD-10-CM | POA: Diagnosis not present

## 2022-05-01 DIAGNOSIS — Z743 Need for continuous supervision: Secondary | ICD-10-CM | POA: Diagnosis not present

## 2022-05-01 DIAGNOSIS — M479 Spondylosis, unspecified: Secondary | ICD-10-CM | POA: Diagnosis present

## 2022-05-01 DIAGNOSIS — J80 Acute respiratory distress syndrome: Secondary | ICD-10-CM | POA: Diagnosis not present

## 2022-05-01 DIAGNOSIS — G894 Chronic pain syndrome: Secondary | ICD-10-CM | POA: Diagnosis not present

## 2022-05-01 DIAGNOSIS — Z9981 Dependence on supplemental oxygen: Secondary | ICD-10-CM

## 2022-05-01 DIAGNOSIS — Z8619 Personal history of other infectious and parasitic diseases: Secondary | ICD-10-CM | POA: Diagnosis not present

## 2022-05-01 DIAGNOSIS — R109 Unspecified abdominal pain: Secondary | ICD-10-CM | POA: Diagnosis not present

## 2022-05-01 DIAGNOSIS — R197 Diarrhea, unspecified: Secondary | ICD-10-CM | POA: Diagnosis not present

## 2022-05-01 DIAGNOSIS — R0602 Shortness of breath: Secondary | ICD-10-CM | POA: Diagnosis not present

## 2022-05-01 LAB — COMPREHENSIVE METABOLIC PANEL
ALT: 7 U/L (ref 0–44)
AST: 30 U/L (ref 15–41)
Albumin: 2.3 g/dL — ABNORMAL LOW (ref 3.5–5.0)
Alkaline Phosphatase: 94 U/L (ref 38–126)
Anion gap: 12 (ref 5–15)
BUN: 15 mg/dL (ref 8–23)
CO2: 22 mmol/L (ref 22–32)
Calcium: 8.6 mg/dL — ABNORMAL LOW (ref 8.9–10.3)
Chloride: 104 mmol/L (ref 98–111)
Creatinine, Ser: 0.7 mg/dL (ref 0.44–1.00)
GFR, Estimated: >60 mL/min (ref >60)
Glucose, Bld: 85 mg/dL (ref 70–99)
Potassium: 3.8 mmol/L (ref 3.5–5.1)
Sodium: 138 mmol/L (ref 135–145)
Total Bilirubin: 0.7 mg/dL (ref 0.3–1.2)
Total Protein: 6.4 g/dL — ABNORMAL LOW (ref 6.5–8.1)

## 2022-05-01 LAB — URINALYSIS, ROUTINE W REFLEX MICROSCOPIC
Bilirubin Urine: NEGATIVE
Glucose, UA: NEGATIVE mg/dL
Hgb urine dipstick: NEGATIVE
Ketones, ur: 80 mg/dL — AB
Leukocytes,Ua: NEGATIVE
Nitrite: NEGATIVE
Protein, ur: NEGATIVE mg/dL
Specific Gravity, Urine: >1.046 — ABNORMAL HIGH (ref 1.005–1.030)
pH: 5 (ref 5.0–8.0)

## 2022-05-01 LAB — BLOOD GAS, ARTERIAL
Acid-Base Excess: 5.9 mmol/L — ABNORMAL HIGH (ref 0.0–2.0)
Bicarbonate: 29.7 mmol/L — ABNORMAL HIGH (ref 20.0–28.0)
O2 Content: 12 L/min
O2 Saturation: 95.6 %
Patient temperature: 37
pCO2 arterial: 39 mmHg (ref 32–48)
pH, Arterial: 7.49 — ABNORMAL HIGH (ref 7.35–7.45)
pO2, Arterial: 71 mmHg — ABNORMAL LOW (ref 83–108)

## 2022-05-01 LAB — CBC WITH DIFFERENTIAL/PLATELET
Abs Immature Granulocytes: 0.11 10*3/uL — ABNORMAL HIGH (ref 0.00–0.07)
Basophils Absolute: 0.1 10*3/uL (ref 0.0–0.1)
Basophils Relative: 1 %
Eosinophils Absolute: 0.5 10*3/uL (ref 0.0–0.5)
Eosinophils Relative: 3 %
HCT: 41.5 % (ref 36.0–46.0)
Hemoglobin: 12.8 g/dL (ref 12.0–15.0)
Immature Granulocytes: 1 %
Lymphocytes Relative: 7 %
Lymphs Abs: 1.5 10*3/uL (ref 0.7–4.0)
MCH: 27.8 pg (ref 26.0–34.0)
MCHC: 30.8 g/dL (ref 30.0–36.0)
MCV: 90 fL (ref 80.0–100.0)
Monocytes Absolute: 2 10*3/uL — ABNORMAL HIGH (ref 0.1–1.0)
Monocytes Relative: 10 %
Neutro Abs: 15.4 10*3/uL — ABNORMAL HIGH (ref 1.7–7.7)
Neutrophils Relative %: 78 %
Platelets: 432 10*3/uL — ABNORMAL HIGH (ref 150–400)
RBC: 4.61 MIL/uL (ref 3.87–5.11)
RDW: 15.7 % — ABNORMAL HIGH (ref 11.5–15.5)
WBC: 19.5 10*3/uL — ABNORMAL HIGH (ref 4.0–10.5)
nRBC: 0 % (ref 0.0–0.2)

## 2022-05-01 LAB — RESPIRATORY PANEL BY PCR

## 2022-05-01 LAB — ECHOCARDIOGRAM COMPLETE
AV Mean grad: 4.6 mmHg
AV Peak grad: 8.3 mmHg
Ao pk vel: 1.44 m/s
Area-P 1/2: 3.65 cm2
Height: 58 in
S' Lateral: 2 cm
Weight: 1744 oz

## 2022-05-01 LAB — LIPASE, BLOOD: Lipase: 22 U/L (ref 11–51)

## 2022-05-01 LAB — TROPONIN I (HIGH SENSITIVITY)
Troponin I (High Sensitivity): 12 ng/L (ref <18)
Troponin I (High Sensitivity): 14 ng/L (ref <18)

## 2022-05-01 LAB — BRAIN NATRIURETIC PEPTIDE: B Natriuretic Peptide: 248.9 pg/mL — ABNORMAL HIGH (ref 0.0–100.0)

## 2022-05-01 LAB — SARS CORONAVIRUS 2 BY RT PCR: SARS Coronavirus 2 by RT PCR: NEGATIVE

## 2022-05-01 LAB — TSH: TSH: 0.95 u[IU]/mL (ref 0.350–4.500)

## 2022-05-01 LAB — PROCALCITONIN: Procalcitonin: 0.3 ng/mL

## 2022-05-01 LAB — D-DIMER, QUANTITATIVE: D-Dimer, Quant: 2.66 ug/mL-FEU — ABNORMAL HIGH (ref 0.00–0.50)

## 2022-05-01 LAB — STREP PNEUMONIAE URINARY ANTIGEN: Strep Pneumo Urinary Antigen: NEGATIVE

## 2022-05-01 MED ORDER — APIXABAN 5 MG PO TABS: 5.0000 mg | ORAL_TABLET | Freq: Two times a day (BID) | ORAL | Status: DC

## 2022-05-01 MED ORDER — CITALOPRAM HYDROBROMIDE 20 MG PO TABS
40.0000 mg | ORAL_TABLET | Freq: Every day | ORAL | Status: DC
  Administered 2022-05-01 – 2022-05-06 (×6): 40 mg via ORAL
  Filled 2022-05-01 (×7): qty 2

## 2022-05-01 MED ORDER — PIRFENIDONE 267 MG PO TABS: 267.0000 mg | ORAL_TABLET | Freq: Three times a day (TID) | ORAL | Status: DC

## 2022-05-01 MED ORDER — IOHEXOL 350 MG/ML SOLN
75.0000 mL | Freq: Once | INTRAVENOUS | Status: AC | PRN
Start: 2022-05-01 — End: 2022-05-01
  Administered 2022-05-01: 75 mL via INTRAVENOUS

## 2022-05-01 MED ORDER — SODIUM CHLORIDE 0.9 % IV SOLN
2.0000 g | Freq: Two times a day (BID) | INTRAVENOUS | Status: DC
  Administered 2022-05-01 – 2022-05-02 (×2): 2 g via INTRAVENOUS
  Filled 2022-05-01 (×4): qty 12.5

## 2022-05-01 MED ORDER — IOHEXOL 300 MG/ML  SOLN
75.0000 mL | Freq: Once | INTRAMUSCULAR | Status: AC | PRN
  Administered 2022-05-01: 75 mL via INTRAVENOUS

## 2022-05-01 MED ORDER — PANTOPRAZOLE SODIUM 40 MG PO TBEC
40.0000 mg | DELAYED_RELEASE_TABLET | Freq: Every day | ORAL | Status: DC
  Administered 2022-05-01 – 2022-05-03 (×3): 40 mg via ORAL
  Filled 2022-05-01 (×4): qty 1

## 2022-05-01 MED ORDER — SODIUM CHLORIDE 0.9% FLUSH: 3.0000 mL | INTRAVENOUS | Status: DC | PRN

## 2022-05-01 MED ORDER — HYDROCODONE-ACETAMINOPHEN 5-325 MG PO TABS
1.0000 | ORAL_TABLET | Freq: Three times a day (TID) | ORAL | Status: DC | PRN
  Administered 2022-05-01 – 2022-05-03 (×3): 1 via ORAL
  Filled 2022-05-01 (×3): qty 1

## 2022-05-01 MED ORDER — PREDNISONE 50 MG PO TABS
50.0000 mg | ORAL_TABLET | Freq: Every day | ORAL | Status: DC
  Administered 2022-05-02: 50 mg via ORAL
  Filled 2022-05-01: qty 1

## 2022-05-01 MED ORDER — SODIUM CHLORIDE 0.9% FLUSH
3.0000 mL | Freq: Two times a day (BID) | INTRAVENOUS | Status: DC
  Administered 2022-05-02 – 2022-05-23 (×43): 3 mL via INTRAVENOUS

## 2022-05-01 MED ORDER — ENOXAPARIN SODIUM 40 MG/0.4ML IJ SOSY
40.0000 mg | PREFILLED_SYRINGE | INTRAMUSCULAR | Status: DC
Start: 2022-05-01 — End: 2022-05-01

## 2022-05-01 MED ORDER — SODIUM CHLORIDE 0.9 % IV SOLN
1.0000 g | Freq: Once | INTRAVENOUS | Status: AC
  Administered 2022-05-01: 1 g via INTRAVENOUS
  Filled 2022-05-01: qty 10

## 2022-05-01 MED ORDER — SODIUM CHLORIDE 0.9 % IV SOLN
250.0000 mL | INTRAVENOUS | Status: DC | PRN
Start: 2022-05-01 — End: 2022-05-23

## 2022-05-01 MED ORDER — SODIUM CHLORIDE 0.9 % IV BOLUS
500.0000 mL | Freq: Once | INTRAVENOUS | Status: AC
  Administered 2022-05-01: 500 mL via INTRAVENOUS

## 2022-05-01 MED ORDER — APIXABAN 5 MG PO TABS
10.0000 mg | ORAL_TABLET | Freq: Two times a day (BID) | ORAL | Status: DC
  Administered 2022-05-01 – 2022-05-05 (×8): 10 mg via ORAL
  Filled 2022-05-01 (×10): qty 2

## 2022-05-01 MED ORDER — FUROSEMIDE 10 MG/ML IJ SOLN
40.0000 mg | Freq: Once | INTRAMUSCULAR | Status: AC
  Administered 2022-05-01: 40 mg via INTRAVENOUS
  Filled 2022-05-01: qty 4

## 2022-05-01 MED ORDER — SODIUM CHLORIDE 0.9 % IV SOLN
500.0000 mg | Freq: Once | INTRAVENOUS | Status: AC
  Administered 2022-05-01: 500 mg via INTRAVENOUS
  Filled 2022-05-01: qty 5

## 2022-05-01 NOTE — ED Notes (Signed)
Pt desatted in 77% after taken off bedpan. RT called. Malinda MD at bedside.

## 2022-05-01 NOTE — H&P (Addendum)
History and Physical    [name] DBZ:[id number] DOB: 10/24/1935 DOA: 05/01/2022  PCP: [name], NP  Patient coming from: home   Chief Complaint: dyspnea, palpitations  HPI: [name] is a 86 y.o. female with medical history significant for ILD, recurrent c diff, ulcerative colitis, chronic pain, chronic hypoxic respiratory failure, who presents with the above.  Reports a decline of several months with acute worsening the past few days. Reports palpitations with minimal movement present for several weeks. No chest pain. Reports having to increase baseline home o2 of 2-3 liters to 4 liters lately, has had several months of hypoxia, and has worsened over past few weeks to being dyspneic at rest worse with minimal ambulation. Slight increased cough, not productive. No fevers or chills. No recent diarrhea or antibiotics. No hemoptysis.    Review of Systems: As per HPI otherwise 10 point review of systems negative.    Past Medical History:  Diagnosis Date   Altered mental state    Anginal pain ([place])    Anxiety    Basal cell carcinoma 05/03/2021   right neck postauricular - Excised 06/12/21   BMI 30.0-30.9,adult    Cellulitis    Chest pain, atypical    Compression fracture of L4 lumbar vertebra    Constipation    COVID-19 virus infection 05/28/2020   Cystitis    Cystocele    Diverticulosis    Fatigue    Fibrosis, idiopathic pulmonary (HCC)    Gastroenteritis    History of back surgery    History of herniated intervertebral disc    HTN (hypertension)    Hypercholesteremia    Hypocalcemia    Incontinence of urine    OA (osteoarthritis)    shoulder and back   Obesity    Osteopenia    Pneumonia    Shortness of breath    Sleep apnea    Squamous cell carcinoma of skin 07/16/2016   Right upper lateral eyebrow. SCCis.   Squamous cell carcinoma of skin 01/13/2018   Left temporal hairline. WD SCC with superficial infiltration.   Thrush    Vitamin D  deficiency     Past Surgical History:  Procedure Laterality Date   BACK SURGERY     CATARACT EXTRACTION W/PHACO Left 04/24/2016   Procedure: CATARACT EXTRACTION PHACO AND INTRAOCULAR LENS PLACEMENT (IOC);  Surgeon: Leandrew Koyanagi, MD;  Location: [facility];  Service: Ophthalmology;  Laterality: Left;   CATARACT EXTRACTION W/PHACO Right 07/14/2017   Procedure: CATARACT EXTRACTION PHACO AND INTRAOCULAR LENS PLACEMENT ([place])  RIGHT;  Surgeon: Leandrew Koyanagi, MD;  Location: [place];  Service: Ophthalmology;  Laterality: Right;   CHOLECYSTECTOMY     COLONOSCOPY WITH PROPOFOL N/A 02/01/2022   Procedure: COLONOSCOPY WITH PROPOFOL;  Surgeon: [name], MD;  [facility] END[facility]COPY;  Service: Gastroenterology;  Laterality: N/A;   CYSTOSCOPY/URETEROSCOPY/HOLMIUM LASER/STENT PLACEMENT Left 04/27/2021   Procedure: CYSTOSCOPY/URETEROSCOPY/HOLMIUM LASER/STENT PLACEMENT;  Surgeon: Billey Co, MD;  Location: [facility] ORS;  Service: Urology;  Laterality: Left;   ESOPHAGOGASTRODUODENOSCOPY (EGD) WITH PROPOFOL N/A 10/13/2018   Procedure: ESOPHAGOGASTRODUODENOSCOPY (EGD) WITH PROPOFOL;  Surgeon: [name], MD;  Location: [facility] ENDOSCOPY;  Service: Gastroenterology;  Laterality: N/A;   ESOPHAGOGASTRODUODENOSCOPY (EGD) WITH PROPOFOL N/A 11/22/2021   Procedure: ESOPHAGOGASTRODUODENOSCOPY (EGD) WITH PROPOFOL;  Surgeon: [name], MD;  Location: [facility] ENDOSCOPY;  Service: Gastroenterology;  Laterality: N/A;   EYE SURGERY     FLEXIBLE SIGMOIDOSCOPY N/A 10/13/2018   Procedure: FLEXIBLE SIGMOIDOSCOPY;  Surgeon: [name], MD;  Location:  [facility] ENDOSCOPY;  Service: Gastroenterology;  Laterality: N/A;   FLEXIBLE SIGMOIDOSCOPY N/A 09/09/2019   Procedure: FLEXIBLE SIGMOIDOSCOPY;  Surgeon: [name], MD;  Location: [facility] ENDOSCOPY;  Service: Gastroenterology;  Laterality: N/A;   FLEXIBLE SIGMOIDOSCOPY N/A 08/04/2020   Procedure: FLEXIBLE SIGMOIDOSCOPY;  Surgeon: [name], MD;   Location: [facility] ENDOSCOPY;  Service: Gastroenterology;  Laterality: N/A;  Per Dr. Vicente Males, unsedated   [place] N/A 08/31/2021   Procedure: FLEXIBLE SIGMOIDOSCOPY;  Surgeon: [name], MD;  Location: [facility] ENDOSCOPY;  Service: Gastroenterology;  Laterality: N/A;  no sedation   HIP ARTHROPLASTY Right 09/27/2020   Procedure: ARTHROPLASTY BIPOLAR HIP (HEMIARTHROPLASTY);  Surgeon: Hessie Knows, MD;  Location: [facility] ORS;  Service: Orthopedics;  Laterality: Right;   JOINT REPLACEMENT     ROTATOR CUFF REPAIR Bilateral    left-12/2009; right-03/2009 dr.califf   TONSILLECTOMY     TOTAL SHOULDER REPLACEMENT Left      reports that she has never smoked. She has never used smokeless tobacco. She reports that she does not drink alcohol and does not use drugs.  No Known Allergies  Family History  Problem Relation Age of Onset   COPD Mother    Cancer Brother        colon    Prior to Admission medications   Medication Sig Start Date End Date Taking? Authorizing Provider  HYDROcodone-acetaminophen (NORCO/VICODIN) 5-325 MG tablet Take 1 tablet by mouth 3 (three) times daily as needed. 03/03/22  Yes [provider]  OXYGEN Inhale 4 L into the lungs at bedtime.   Yes [provider]  Biotin 10 MG CAPS Take 1 capsule by mouth daily.    [provider]  citalopram (CELEXA) 40 MG tablet Take 40 mg by mouth daily. 11/04/21   [provider]  denosumab (PROLIA) 60 MG/ML SOSY injection Inject 60 mg into the skin every 6 (six) months.    [provider]  ENTYVIO 300 MG injection Inject 300 mg into the vein every 14 (fourteen) days. 04/15/22   [provider]  ESBRIET 267 MG TABS Take 267 mg by mouth 3 (three) times daily with meals. 02/25/17   [provider]  Magnesium 500 MG CAPS Take 1 capsule by mouth as needed. 10/18/21   [provider]  omeprazole (PRILOSEC) 40 MG capsule Take 1 capsule (40 mg total) by mouth daily. 04/22/22   [name], MD  vitamin B-12 (CYANOCOBALAMIN) 1000 MCG tablet Take 1,000 mcg by mouth every other day.    [provider]  VOLTAREN 1 % GEL Apply 2 g topically in the morning, at noon, and at bedtime. Shoulders and back 12/23/17   [provider]    Physical Exam: Vitals:   05/01/22 1100 05/01/22 1145 05/01/22 1200 05/01/22 1230  BP: 102/69 117/62 128/73 116/67  Pulse: 88 82 81 89  Resp: (!) 34 (!) 21 (!) 25 (!) 32  Temp:   98.3 F (36.8 C)   TempSrc:      SpO2: 100% 100% 93% 94%  Weight:      Height:        Constitutional: No acute distress Head: Atraumatic Eyes: Conjunctiva clear ENM: Moist mucous membranes. Normal dentition.  Neck: Supple Respiratory: mild tachypnea, no distress, diffuse crackles Cardiovascular: Regular rate and rhythm. No murmurs/rubs/gallops. Abdomen: Non-tender, non-distended. No masses. No rebound or guarding. Positive bowel sounds. Musculoskeletal: No joint deformity upper and lower extremities. Normal ROM, no contractures. Normal muscle tone.  Skin: No rashes, lesions, or ulcers.  Extremities: No  peripheral edema. Palpable peripheral pulses. Neurologic: Alert, moving all 4 extremities. Psychiatric: Normal insight and judgement.   Labs on Admission: I have personally reviewed following labs and imaging studies  CBC: Recent Labs  Lab 05/01/22 0759  WBC 19.5*  NEUTROABS 15.4*  HGB 12.8  HCT 41.5  MCV 90.0  PLT 536*   Basic Metabolic Panel: Recent Labs  Lab 05/01/22 0759  NA 138  K 3.8  CL 104  CO2 22  GLUCOSE 85  BUN 15  CREATININE 0.70  CALCIUM 8.6*   GFR: Estimated Creatinine Clearance: 37.3 mL/min (by C-G formula based on SCr of 0.7 mg/dL). Liver Function Tests: Recent Labs  Lab 05/01/22 0759  AST 30  ALT 7  ALKPHOS 94  BILITOT 0.7  PROT 6.4*  ALBUMIN 2.3*   Recent Labs  Lab 05/01/22 0759  LIPASE 22   No results for input(s): "AMMONIA" in the last 168 hours. Coagulation Profile: No results for  input(s): "INR", "PROTIME" in the last 168 hours. Cardiac Enzymes: No results for input(s): "CKTOTAL", "CKMB", "CKMBINDEX", "TROPONINI" in the last 168 hours. BNP (last 3 results) No results for input(s): "PROBNP" in the last 8760 hours. HbA1C: No results for input(s): "HGBA1C" in the last 72 hours. CBG: No results for input(s): "GLUCAP" in the last 168 hours. Lipid Profile: No results for input(s): "CHOL", "HDL", "LDLCALC", "TRIG", "CHOLHDL", "LDLDIRECT" in the last 72 hours. Thyroid Function Tests: No results for input(s): "TSH", "T4TOTAL", "FREET4", "T3FREE", "THYROIDAB" in the last 72 hours. Anemia Panel: No results for input(s): "VITAMINB12", "FOLATE", "FERRITIN", "TIBC", "IRON", "RETICCTPCT" in the last 72 hours. Urine analysis:    Component Value Date/Time   COLORURINE YELLOW (A) 04/21/2021 0730   APPEARANCEUR HAZY (A) 04/21/2021 0730   APPEARANCEUR Clear 02/02/2015 1148   LABSPEC 1.025 04/21/2021 0730   LABSPEC 1.023 02/08/2014 1851   PHURINE 5.0 04/21/2021 0730   GLUCOSEU NEGATIVE 04/21/2021 0730   GLUCOSEU Negative 02/08/2014 1851   HGBUR NEGATIVE 04/21/2021 0730   BILIRUBINUR NEGATIVE 04/21/2021 0730   BILIRUBINUR neg 02/16/2020 1151   BILIRUBINUR Negative 02/02/2015 1148   BILIRUBINUR Negative 02/08/2014 1851   KETONESUR NEGATIVE 04/21/2021 0730   PROTEINUR 30 (A) 04/21/2021 0730   UROBILINOGEN 0.2 02/16/2020 1151   NITRITE NEGATIVE 04/21/2021 0730   LEUKOCYTESUR NEGATIVE 04/21/2021 0730   LEUKOCYTESUR 3+ 02/08/2014 1851    Radiological Exams on Admission: CT ABDOMEN PELVIS W CONTRAST  Result Date: 05/01/2022 CLINICAL DATA:  Shortness of breath. Sharp left lower quadrant pain. History of pulmonary fibrosis. EXAM: CT ABDOMEN AND PELVIS WITH CONTRAST TECHNIQUE: Multidetector CT imaging of the abdomen and pelvis was performed using the standard protocol following bolus administration of intravenous contrast. RADIATION DOSE REDUCTION: This exam was performed  according to the departmental dose-optimization program which includes automated exposure control, adjustment of the mA and/or kV according to patient size and/or use of iterative reconstruction technique. CONTRAST:  69m OMNIPAQUE IOHEXOL 300 MG/ML  SOLN COMPARISON:  01/17/2022 CT.  02/14/2022 MRI. FINDINGS: Lower chest: Pulmonary fibrotic pattern with bronchiectasis. Pulmonary parenchymal opacity appears worsened compared to prior study and there could be a degree of edema or infectious pneumonia. Hepatobiliary: Liver parenchyma is normal. Previous cholecystectomy. Pancreas: Normal Spleen: Normal Adrenals/Urinary Tract: Adrenal glands are normal. Kidneys are normal except for an 8 mm stone in the upper pole the right kidney without obstruction. Bladder is normal. Stomach/Bowel: Stomach and small intestine are normal. Redemonstration of a diffuse colitis pattern, particularly in the left colon, as was seen at previous imaging. The  patient has diverticulosis but there is no convincing diverticulitis. Low level diverticulitis can be inapparent at imaging. Vascular/Lymphatic: Aortic atherosclerosis. No aneurysm. IVC is normal. No adenopathy. Reproductive: No pelvic mass. Other: No free fluid or air. Musculoskeletal: Chronic degenerative change and curvature of the spine. Old augmented fractures at T12, L1 and L4. Old healed superior endplate fracture at L3. IMPRESSION: Redemonstration of diffuse colitis pattern, particularly affecting the left colon, similar to the prior imaging of May and June of this year. Infectious or inflammatory colitis is favored. The patient does have diverticulosis but I do not see evidence of acute diverticulitis. Low level diverticulitis can be inapparent at imaging. Findings of chronic pulmonary fibrosis. Increased pulmonary parenchymal density which could be seen in the setting of edema or infectious inflammation. Nonobstructing 8 mm stone at the upper pole of the right kidney.  Electronically Signed   By: Nelson Chimes M.D.   On: 05/01/2022 11:46   DG Chest Portable 1 View  Result Date: 05/01/2022 CLINICAL DATA:  Shortness of breath, history of pulmonary fibrosis EXAM: PORTABLE CHEST 1 VIEW COMPARISON:  10/16/2021 FINDINGS: The heart size and mediastinal contours are within normal limits. Diffuse bilateral interstitial opacity, superimposed upon chronic, bibasilar fibrotic change. Chronic elevation of the left hemidiaphragm. Status post left shoulder reverse arthroplasty. The visualized skeletal structures are unremarkable. IMPRESSION: Diffuse bilateral interstitial opacity, superimposed upon chronic, bibasilar fibrotic change. Findings may reflect edema or infection. No focal airspace opacity. Electronically Signed   By: Delanna Ahmadi M.D.   On: 05/01/2022 08:10    EKG: Independently reviewed. Sinus tachycardia  Assessment/Plan Principal Problem:   CAP (community acquired pneumonia) Active Problems:   HTN (hypertension)   Idiopathic pulmonary fibrosis (HCC)   Chronic pain syndrome   Ulcerative colitis ([place])   # CAP # ILD Underlying severe ILD that is likely progressing but at least several days of acutely worsening dyspnea. With leukocytosis and cxr finding opacities superimposed on her chronic fibrotic change. Hx recurrent c diff so decision to initiate abx isn't made lightly. Covid negative. Serial troponins negative. Does have new PE and possible CHF exacerbation - stop ceftriaxone/azithromycin, start cefepime - start prednisone 50 qd given underlying ILD - continue home perfenidone - f/u respiratory panel, blood cultures, sputum for culture if able to produce, urine antigens, procal - will involve pulm - PT consult, may consider involving palliative  # Pulmonary embolus, acute Small on CT, doubt it's the main driver for her dyspnea - will start apixaban - TTE - PVL  # Possible CHF Dyspneic, elevated bnp, possible pulmonary edema on CT - will give  lasix 40 IV once - f/u TTE  # Acute on chronic hypoxic respiratory failure Baseline is 2-3 liters but husband says has been hypoxic for months, recently increase to 4 L. Here breathing relatively comfortably on 6 liters with O2s in the upper 80s - Walker O2, goal upper 80s  # Chronic pain - home norco prn  # Palpitations Likely consequence of dyspnea. Here sinus rhythm, no anemia - f/u dimer, tsh - monitor on tele - f/u second trop  # Abdominal pain Endorsed to EDP, not to me, nothing acute seen on CT of abdomen/pelvis - monitor  # History recurrent c diff No recent diarrhea, will need close monitoring while on abx  # UC No apparent flare. On entyvio ever 2 weeks at home  DVT prophylaxis: lovenox Code Status: full  Family Communication: husband and daughter updated @ bedside  Consults called: pulm  Level of care: Progressive Status is: Inpatient Remains inpatient appropriate because: severity of illness    Desma Maxim MD [facility] Pager [phone]  If 7PM-7AM, please contact night-coverage [email or web address] Password [email or web address]  05/01/2022, 1:18 PM

## 2022-05-01 NOTE — Progress Notes (Addendum)
[employer] Notified regarding concern with decreased sats and increased oxygen requirements Bedside patient with short shallow breaths but sats 100 %. Nurse reports mainly she has been desatting with minimal exertion. (Not surprising given her fluid overload, acute PE and underlying lung disease ABG shows metabolic alkalosis - 1.10, 39, 71, 29.7. on 12L HFNC  Titrate oxygen to keep sats 88-92% Notify primary team of any significant increased work of breathing or pain Alkalosis ?secondary to loop diuretic. Needs monitored, possible change to diamox

## 2022-05-01 NOTE — Consult Note (Signed)
Pharmacy Antibiotic Note  [name] is a 86 y.o. female with PMH including ILD, recurrent C diff, ulcerative colitis, chronic pain, chronic hypoxic respiratory failure admitted on 05/01/2022 with pneumonia.  Pharmacy has been consulted for cefepime dosing.  Plan:  Cefepime 2 g IV q12h  Height: 4' 10"  (147.3 cm) Weight: 49.4 kg (109 lb) IBW/kg (Calculated) : 40.9  Temp (24hrs), Avg:98.2 F (36.8 C), Min:98.1 F (36.7 C), Max:98.3 F (36.8 C)  Recent Labs  Lab 05/01/22 0759  WBC 19.5*  CREATININE 0.70    Estimated Creatinine Clearance: 37.3 mL/min (by C-G formula based on SCr of 0.7 mg/dL).    No Known Allergies  Antimicrobials this admission: Ceftriaxone 8/30 x 1 Azithromycin 8/30 x 1 Cefepime 8/30 >>   Dose adjustments this admission: N/A  Microbiology results: 8/30 BCx: pending 8/30 RespCx: pending 8/30 SARS-CoV-2 PCR: (-)  Thank you for allowing pharmacy to be a part of this patient's care.  Benita Gutter 05/01/2022 1:41 PM

## 2022-05-01 NOTE — ED Triage Notes (Signed)
Pt BIB [facility] from home for [facility], inability to walk, and sharp LLQ pain. Pt wears 2L at baseline and has hx pulmonary fibrosis. NRB with sats of 94%.

## 2022-05-01 NOTE — Consult Note (Signed)
PULMONOLOGY         Date: 05/01/2022,   MRN# [id number] [name] 08/22/1936     AdmissionWeight: 49.4 kg                 CurrentWeight: 49.4 kg  Referring provider: Dr Si Raider   CHIEF COMPLAINT:   Acute on chronic hypoxemic respiratory failure   HISTORY OF PRESENT ILLNESS   This is a pleasant 86 yo F with hx of UC, ILD with Pulmonary fibrosis, chronic pain syndrome, who came in due to acute on chronic hypoxemia. She reports increased O2 requirement at home with worsening cough, she denies flu like illness or fevers.  Labwork reveals elevated BNP   PAST MEDICAL HISTORY   Past Medical History:  Diagnosis Date   Altered mental state    Anginal pain ([place])    Anxiety    Basal cell carcinoma 05/03/2021   right neck postauricular - Excised 06/12/21   BMI 30.0-30.9,adult    Cellulitis    Chest pain, atypical    Compression fracture of L4 lumbar vertebra    Constipation    COVID-19 virus infection 05/28/2020   Cystitis    Cystocele    Diverticulosis    Fatigue    Fibrosis, idiopathic pulmonary (HCC)    Gastroenteritis    History of back surgery    History of herniated intervertebral disc    HTN (hypertension)    Hypercholesteremia    Hypocalcemia    Incontinence of urine    OA (osteoarthritis)    shoulder and back   Obesity    Osteopenia    Pneumonia    Shortness of breath    Sleep apnea    Squamous cell carcinoma of skin 07/16/2016   Right upper lateral eyebrow. SCCis.   Squamous cell carcinoma of skin 01/13/2018   Left temporal hairline. WD SCC with superficial infiltration.   Thrush    Vitamin D deficiency      SURGICAL HISTORY   Past Surgical History:  Procedure Laterality Date   BACK SURGERY     CATARACT EXTRACTION W/PHACO Left 04/24/2016   Procedure: CATARACT EXTRACTION PHACO AND INTRAOCULAR LENS PLACEMENT (IOC);  Surgeon: Leandrew Koyanagi, MD;  Location: [place];  Service: Ophthalmology;  Laterality: Left;    CATARACT EXTRACTION W/PHACO Right 07/14/2017   Procedure: CATARACT EXTRACTION PHACO AND INTRAOCULAR LENS PLACEMENT ([place])  RIGHT;  Surgeon: Leandrew Koyanagi, MD;  Location: [place];  Service: Ophthalmology;  Laterality: Right;   CHOLECYSTECTOMY     COLONOSCOPY WITH PROPOFOL N/A 02/01/2022   Procedure: COLONOSCOPY WITH PROPOFOL;  Surgeon: [name], MD;  Location: [facility] ENDOSCOPY;  Service: Gastroenterology;  Laterality: N/A;   CYSTOSCOPY/URETEROSCOPY/HOLMIUM LASER/STENT PLACEMENT Left 04/27/2021   Procedure: CYSTOSCOPY/URETEROSCOPY/HOLMIUM LASER/STENT PLACEMENT;  Surgeon: Billey Co, MD;  Location: [facility];  Service: Urology;  Laterality: Left;   ESOPHAGOGASTRODUODENOSCOPY (EGD) WITH PROPOFOL N/A 10/13/2018   Procedure: ESOPHAGOGASTRODUODENOSCOPY (EGD) WITH PROPOFOL;  Surgeon: [name], MD;  Location: [facility] ENDOSCOPY;  Service: Gastroenterology;  Laterality: N/A;   ESOPHAGOGASTRODUODENOSCOPY (EGD) WITH PROPOFOL N/A 11/22/2021   Procedure: ESOPHAGOGASTRODUODENOSCOPY (EGD) WITH PROPOFOL;  Surgeon: [name], MD;  Location: [facility] ENDOSCOPY;  Service: Gastroenterology;  Laterality: N/A;   EYE SURGERY     FLEXIBLE SIGMOIDOSCOPY N/A 10/13/2018   Procedure: FLEXIBLE SIGMOIDOSCOPY;  Surgeon: [name], MD;  Location: [facility] ENDOSCOPY;  Service: Gastroenterology;  Laterality: N/A;   FLEXIBLE SIGMOIDOSCOPY N/A 09/09/2019   Procedure: FLEXIBLE SIGMOIDOSCOPY;  Surgeon: [name], MD;  Location:  [facility] ENDOSCOPY;  Service: Gastroenterology;  Laterality: N/A;   FLEXIBLE SIGMOIDOSCOPY N/A 08/04/2020   Procedure: FLEXIBLE SIGMOIDOSCOPY;  Surgeon: [name], MD;  Location: [facility] ENDOSCOPY;  Service: Gastroenterology;  Laterality: N/A;  Per Dr. Vicente Males, unsedated   [place] N/A 08/31/2021   Procedure: FLEXIBLE SIGMOIDOSCOPY;  Surgeon: [name], MD;  Location: [facility] ENDOSCOPY;  Service: Gastroenterology;  Laterality: N/A;  no sedation   HIP ARTHROPLASTY Right 09/27/2020    Procedure: ARTHROPLASTY BIPOLAR HIP (HEMIARTHROPLASTY);  Surgeon: Hessie Knows, MD;  Location: [facility] ORS;  Service: Orthopedics;  Laterality: Right;   JOINT REPLACEMENT     ROTATOR CUFF REPAIR Bilateral    left-12/2009; right-03/2009 dr.[email or web address]   TONSILLECTOMY     TOTAL SHOULDER REPLACEMENT Left      FAMILY HISTORY   Family History  Problem Relation Age of Onset   COPD Mother    Cancer Brother        colon     SOCIAL HISTORY   Social History   Tobacco Use   Smoking status: Never   Smokeless tobacco: Never  Vaping Use   Vaping Use: Never used  Substance Use Topics   Alcohol use: No   Drug use: No     MEDICATIONS    Home Medication:  Current Outpatient Rx   Order #: [id number] Class: Historical Med   Order #: [id number] Class: Historical Med   Order #: [id number] Class: Historical Med   Order #: [id number] Class: Historical Med   Order #: [id number] Class: Historical Med   Order #: [id number] Class: Historical Med   Order #: [id number] Class: Historical Med   Order #: [id number] Class: Historical Med   Order #: [id number] Class: Normal   Order #: [id number] Class: Historical Med   Order #: [id number] Class: Historical Med    Current Medication:  Current Facility-Administered Medications:    0.9 %  sodium chloride infusion, 250 mL, Intravenous, PRN, Wouk, Ailene Rud, MD   ceFEPIme (MAXIPIME) 2 g in sodium chloride 0.9 % 100 mL IVPB, 2 g, Intravenous, Q12H, Benita Gutter, RPH, Last Rate: 200 mL/hr at 05/01/22 1514, 2 g at 05/01/22 1514   citalopram (CELEXA) tablet 40 mg, 40 mg, Oral, Daily, Wouk, Ailene Rud, MD, 40 mg at 05/01/22 1517   enoxaparin (LOVENOX) injection 40 mg, 40 mg, Subcutaneous, Q24H, Wouk, Ailene Rud, MD   HYDROcodone-acetaminophen (NORCO/VICODIN) 5-325 MG per tablet 1 tablet, 1 tablet, Oral, TID PRN, Si Raider, Ailene Rud, MD, 1 tablet at 05/01/22 1517   iohexol (OMNIPAQUE) 350 MG/ML injection 75 mL, 75 mL, Intravenous, Once PRN, Wouk, Ailene Rud, MD    pantoprazole (PROTONIX) EC tablet 40 mg, 40 mg, Oral, Daily, Wouk, Ailene Rud, MD, 40 mg at 05/01/22 1517   Pirfenidone TABS 267 mg, 267 mg, Oral, TID WC, Wouk, Ailene Rud, MD   Derrill Memo ON 05/02/2022] predniSONE (DELTASONE) tablet 50 mg, 50 mg, Oral, Q breakfast, Wouk, Ailene Rud, MD   sodium chloride flush (NS) 0.9 % injection 3 mL, 3 mL, Intravenous, Q12H, Wouk, Ailene Rud, MD   sodium chloride flush (NS) 0.9 % injection 3 mL, 3 mL, Intravenous, PRN, Wouk, Ailene Rud, MD  Current Outpatient Medications:    HYDROcodone-acetaminophen (NORCO/VICODIN) 5-325 MG tablet, Take 1 tablet by mouth 3 (three) times daily as needed., Disp: , Rfl:    OXYGEN, Inhale 4 L into the lungs at bedtime., Disp: , Rfl:    Biotin 10 MG CAPS, Take 1 capsule by mouth daily., Disp: , Rfl:    citalopram (CELEXA) 40 MG tablet, Take 40 mg by mouth daily., Disp: ,  Rfl:    denosumab (PROLIA) 60 MG/ML SOSY injection, Inject 60 mg into the skin every 6 (six) months., Disp: , Rfl:    ENTYVIO 300 MG injection, Inject 300 mg into the vein every 14 (fourteen) days., Disp: , Rfl:    ESBRIET 267 MG TABS, Take 267 mg by mouth 3 (three) times daily with meals., Disp: , Rfl:    Magnesium 500 MG CAPS, Take 1 capsule by mouth as needed., Disp: , Rfl:    omeprazole (PRILOSEC) 40 MG capsule, Take 1 capsule (40 mg total) by mouth daily., Disp: 90 capsule, Rfl: 3   vitamin B-12 (CYANOCOBALAMIN) 1000 MCG tablet, Take 1,000 mcg by mouth every other day., Disp: , Rfl:    VOLTAREN 1 % GEL, Apply 2 g topically in the morning, at noon, and at bedtime. Shoulders and back, Disp: , Rfl: 2    ALLERGIES   Patient has no known allergies.     REVIEW OF SYSTEMS    Review of Systems:  Gen:  Denies  fever, sweats, chills weigh loss  HEENT: Denies blurred vision, double vision, ear pain, eye pain, hearing loss, nose bleeds, sore throat Cardiac:  No dizziness, chest pain or heaviness, chest tightness,edema Resp:   reports dyspnea  chronically  Gi: Denies swallowing difficulty, stomach pain, nausea or vomiting, diarrhea, constipation, bowel incontinence Gu:  Denies bladder incontinence, burning urine Ext:   Denies Joint pain, stiffness or swelling Skin: Denies  skin rash, easy bruising or bleeding or hives Endoc:  Denies polyuria, polydipsia , polyphagia or weight change Psych:   Denies depression, insomnia or hallucinations   Other:  All other systems negative   VS: BP 123/77   Pulse 92   Temp 98.3 F (36.8 C)   Resp (!) 36   Ht 4' 10"  (1.473 m)   Wt 49.4 kg   SpO2 98%   BMI 22.78 kg/m      PHYSICAL EXAM    GENERAL:NAD, no fevers, chills, no weakness no fatigue HEAD: Normocephalic, atraumatic.  EYES: Pupils equal, round, reactive to light. Extraocular muscles intact. No scleral icterus.  MOUTH: Moist mucosal membrane. Dentition intact. No abscess noted.  EAR, NOSE, THROAT: Clear without exudates. No external lesions.  NECK: Supple. No thyromegaly. No nodules. No JVD.  PULMONARY: decreased breath sounds with mild rhonchi worse at bases bilaterally.  CARDIOVASCULAR: S1 and S2. Regular rate and rhythm. No murmurs, rubs, or gallops. No edema. Pedal pulses 2+ bilaterally.  GASTROINTESTINAL: Soft, nontender, nondistended. No masses. Positive bowel sounds. No hepatosplenomegaly.  MUSCULOSKELETAL: No swelling, clubbing, or edema. Range of motion full in all extremities.  NEUROLOGIC: Cranial nerves II through XII are intact. No gross focal neurological deficits. Sensation intact. Reflexes intact.  SKIN: No ulceration, lesions, rashes, or cyanosis. Skin warm and dry. Turgor intact.  PSYCHIATRIC: Mood, affect within normal limits. The patient is awake, alert and oriented x 3. Insight, judgment intact.       IMAGING     ASSESSMENT/PLAN   Acute on chronic hypoxemic respiratory failure    - abnormal CT chest with interval development of inflammatory changes as evidenced with serial CT chest above   -  possible overlying interstitial edema   - RVP has been ordered already not yet collected   - ESR/CRP to check on iflammatory changes  -currently on prednisone 82m   - the situation is very difficult because she benefits from steroids with ulcerative colitis and ILD but it potentiates infection including Cdiff so therapy is further complicated   -  plan to diruese gently as while treating  - her abdominal scan shows ongoing colitis and she may benefit from ID evaluation since this has been going of for months now.  She is at risk for GI bleed with steroids and ongoing colitis and at risk for toxic megacolon.    Pulmonary fibrosis - chronic   - on esbriet - please continue as current  -PT /OT as able             Thank you for allowing me to participate in the care of this patient.   Patient/Family are satisfied with care plan and all questions have been answered.    Provider disclosure: Patient with at least one acute or chronic illness or injury that poses a threat to life or bodily function and is being managed actively during this encounter.  All of the below services have been performed independently by signing provider:  review of prior documentation from internal and or external health records.  Review of previous and current lab results.  Interview and comprehensive assessment during patient visit today. Review of current and previous chest radiographs/CT scans. Discussion of management and test interpretation with health care team and patient/family.   This document was prepared using Dragon voice recognition software and may include unintentional dictation errors.     Ottie Glazier, M.D.  Division of Pulmonary & Critical Care Medicine

## 2022-05-01 NOTE — Consult Note (Signed)
[place] for Apixaban Indication: pulmonary embolus  Patient Measurements: Height: 4' 10"  (147.3 cm) Weight: 49.4 kg (109 lb) IBW/kg (Calculated) : 40.9  Labs: Recent Labs    05/01/22 0759 05/01/22 1400  HGB 12.8  --   HCT 41.5  --   PLT 432*  --   CREATININE 0.70  --   TROPONINIHS 12 14    Estimated Creatinine Clearance: 37.3 mL/min (by C-G formula based on SCr of 0.7 mg/dL).   Medical History: Past Medical History:  Diagnosis Date   Altered mental state    Anginal pain ([place])    Anxiety    Basal cell carcinoma 05/03/2021   right neck postauricular - Excised 06/12/21   BMI 30.0-30.9,adult    Cellulitis    Chest pain, atypical    Compression fracture of L4 lumbar vertebra    Constipation    COVID-19 virus infection 05/28/2020   Cystitis    Cystocele    Diverticulosis    Fatigue    Fibrosis, idiopathic pulmonary (HCC)    Gastroenteritis    History of back surgery    History of herniated intervertebral disc    HTN (hypertension)    Hypercholesteremia    Hypocalcemia    Incontinence of urine    OA (osteoarthritis)    shoulder and back   Obesity    Osteopenia    Pneumonia    Shortness of breath    Sleep apnea    Squamous cell carcinoma of skin 07/16/2016   Right upper lateral eyebrow. SCCis.   Squamous cell carcinoma of skin 01/13/2018   Left temporal hairline. WD SCC with superficial infiltration.   Thrush    Vitamin D deficiency     Medications:  No anticoagulation prior to admission per my chart review  Assessment: Patient is a 86 y/o F with medical history as above and including pulmonary fibrosis on 2L oxygen at baseline who presented to the ED 8/30 with shortness of breath. Patient subsequently found to have acute PE. Pharmacy consulted to initiate apixaban for treatment.  Plan:  --Apixaban 10 mg BID x 7 days followed by apixaban 5 mg BID for remaining duration of therapy --CBC at least every 3 days per  protocol  Benita Gutter 05/01/2022,5:39 PM

## 2022-05-01 NOTE — ED Provider Notes (Signed)
[facility] Provider Note    Event Date/Time   First MD Initiated Contact with Patient 05/01/22 [id number]     (approximate)   History   Shortness of Breath, Abdominal Pain, and Weakness   HPI  [name] is a 86 y.o. female  who presents to the emergency department today because of concern for shortness of breath and weakness. Patient has history of pulmonary fibrosis and is on 4L O2 at baseline. For the past week however has felt like her shortness of breath has gotten worse. Has noticed that her oxygen level has been low. Has had to turn up her oxygen. She has had an associated cough. The patient denies any fevers. When EMS came to transport patient and got her out of bed she also noticed that she was having some left sided abdominal pain. Denies similar pain int eh past.        Physical Exam   Triage Vital Signs: ED Triage Vitals  Enc Vitals Group     BP 05/01/22 0737 136/76     Pulse Rate 05/01/22 0740 90     Resp 05/01/22 0745 (!) 28     Temp --      Temp src --      SpO2 05/01/22 0737 93 %     Weight 05/01/22 0753 109 lb (49.4 kg)     Height 05/01/22 0753 4' 10"  (1.473 m)     Head Circumference --      Peak Flow --      Pain Score 05/01/22 0738 6     Pain Loc --      Pain Edu? --      Excl. in [place]? --     Most recent vital signs: Vitals:   05/01/22 0740 05/01/22 0745  BP:    Pulse: 90 95  Resp:  (!) 28  SpO2: 95% 100%    General: Awake, alert, oriented. CV:  Good peripheral perfusion. Regular rate and rhythm. Resp:  Normal effort. Diffuse rhonchi Abd:  No distention. Tender to palpation in the left lower quadrant.      ED Results / Procedures / Treatments   Labs (all labs ordered are listed, but only abnormal results are displayed) Labs Reviewed  CBC WITH DIFFERENTIAL/PLATELET - Abnormal; Notable for the following components:      Result Value   WBC 19.5 (*)    RDW 15.7 (*)    Platelets 432 (*)    Neutro Abs 15.4 (*)     Monocytes Absolute 2.0 (*)    Abs Immature Granulocytes 0.11 (*)    All other components within normal limits  COMPREHENSIVE METABOLIC PANEL - Abnormal; Notable for the following components:   Calcium 8.6 (*)    Total Protein 6.4 (*)    Albumin 2.3 (*)    All other components within normal limits  SARS CORONAVIRUS 2 BY RT PCR  CULTURE, BLOOD (ROUTINE X 2)  CULTURE, BLOOD (ROUTINE X 2)  EXPECTORATED SPUTUM ASSESSMENT W GRAM STAIN, RFLX TO RESP C  LIPASE, BLOOD  URINALYSIS, ROUTINE W REFLEX MICROSCOPIC  TSH  D-DIMER, QUANTITATIVE  BRAIN NATRIURETIC PEPTIDE  LEGIONELLA PNEUMOPHILA SEROGP 1 UR AG  STREP PNEUMONIAE URINARY ANTIGEN  TROPONIN I (HIGH SENSITIVITY)  TROPONIN I (HIGH SENSITIVITY)     EKG  I, Nance Pear, attending physician, personally viewed and interpreted this EKG  EKG Time: 0743 Rate: 97 Rhythm: sinus rhythm Axis: left axis deviation Intervals: qtc 442 QRS: LVH ST changes:  no st elevation Impression: abnormal ekg  RADIOLOGY I independently interpreted and visualized the CXR. My interpretation: Bilateral airspace disease. Radiology interpretation:  IMPRESSION:  Diffuse bilateral interstitial opacity, superimposed upon chronic,  bibasilar fibrotic change. Findings may reflect edema or infection.  No focal airspace opacity.    I independently interpreted and visualized the CT abd/pel. My interpretation: No free air Radiology interpretation:  IMPRESSION:  Redemonstration of diffuse colitis pattern, particularly affecting  the left colon, similar to the prior imaging of May and June of this  year. Infectious or inflammatory colitis is favored. The patient  does have diverticulosis but I do not see evidence of acute  diverticulitis. Low level diverticulitis can be inapparent at  imaging.    Findings of chronic pulmonary fibrosis. Increased pulmonary  parenchymal density which could be seen in the setting of edema or  infectious inflammation.     Nonobstructing 8 mm stone at the upper pole of the right kidney.    PROCEDURES:  Critical Care performed: No  Procedures   MEDICATIONS ORDERED IN ED: Medications - No data to display   IMPRESSION / MDM / [place] / ED COURSE  I reviewed the triage vital signs and the nursing notes.                              Differential diagnosis includes, but is not limited to, pneumonia, ACS, pneumothorax.  Patient's presentation is most consistent with acute presentation with potential threat to life or bodily function.  Patient presents to the emergency department today because of concerns for shortness of breath and weakness.  Also had secondary complaint of left lower quadrant abdominal pain.  Blood work does show leukocytosis.  Chest x-ray was concerning for possible pneumonia.  Did obtain a CT abdomen pelvis given abdominal pain.  This did show findings that I think could be consistent with patient's known history of ulcerative colitis.  Discussed findings of pneumonia with the patient.  Discussed with Dr. Si Raider with hospital service will plan on admission.    FINAL CLINICAL IMPRESSION(S) / ED DIAGNOSES   Final diagnoses:  Pneumonia due to infectious organism, unspecified laterality, unspecified part of lung      Note:  This document was prepared using Dragon voice recognition software and may include unintentional dictation errors.    Nance Pear, MD 05/01/22 1352

## 2022-05-01 NOTE — ED Notes (Signed)
Message sent to [facility] MD

## 2022-05-02 ENCOUNTER — Other Ambulatory Visit (HOSPITAL_COMMUNITY): Payer: Self-pay

## 2022-05-02 ENCOUNTER — Telehealth (HOSPITAL_COMMUNITY): Payer: Self-pay | Admitting: Pharmacy Technician

## 2022-05-02 ENCOUNTER — Telehealth: Payer: Medicare HMO | Admitting: Gastroenterology

## 2022-05-02 DIAGNOSIS — I2699 Other pulmonary embolism without acute cor pulmonale: Secondary | ICD-10-CM

## 2022-05-02 DIAGNOSIS — J9601 Acute respiratory failure with hypoxia: Secondary | ICD-10-CM | POA: Diagnosis not present

## 2022-05-02 DIAGNOSIS — J9621 Acute and chronic respiratory failure with hypoxia: Secondary | ICD-10-CM | POA: Diagnosis not present

## 2022-05-02 LAB — BLOOD CULTURE ID PANEL (REFLEXED) - BCID2

## 2022-05-02 LAB — BASIC METABOLIC PANEL
Anion gap: 13 (ref 5–15)
BUN: 15 mg/dL (ref 8–23)
CO2: 27 mmol/L (ref 22–32)
Calcium: 8.4 mg/dL — ABNORMAL LOW (ref 8.9–10.3)
Chloride: 99 mmol/L (ref 98–111)
Creatinine, Ser: 0.76 mg/dL (ref 0.44–1.00)
GFR, Estimated: >60 mL/min (ref >60)
Glucose, Bld: 114 mg/dL — ABNORMAL HIGH (ref 70–99)
Potassium: 2.9 mmol/L — ABNORMAL LOW (ref 3.5–5.1)
Sodium: 139 mmol/L (ref 135–145)

## 2022-05-02 LAB — CBC
HCT: 39.4 % (ref 36.0–46.0)
Hemoglobin: 12.5 g/dL (ref 12.0–15.0)
MCH: 28 pg (ref 26.0–34.0)
MCHC: 31.7 g/dL (ref 30.0–36.0)
MCV: 88.1 fL (ref 80.0–100.0)
Platelets: 350 10*3/uL (ref 150–400)
RBC: 4.47 MIL/uL (ref 3.87–5.11)
RDW: 15.6 % — ABNORMAL HIGH (ref 11.5–15.5)
WBC: 10.8 10*3/uL — ABNORMAL HIGH (ref 4.0–10.5)
nRBC: 0 % (ref 0.0–0.2)

## 2022-05-02 LAB — C DIFFICILE QUICK SCREEN W PCR REFLEX
C Diff antigen: NEGATIVE
C Diff interpretation: NOT DETECTED
C Diff toxin: NEGATIVE

## 2022-05-02 LAB — SEDIMENTATION RATE: Sed Rate: 36 mm/hr — ABNORMAL HIGH (ref 0–30)

## 2022-05-02 LAB — PROCALCITONIN: Procalcitonin: 0.23 ng/mL

## 2022-05-02 LAB — C-REACTIVE PROTEIN: CRP: 25.9 mg/dL — ABNORMAL HIGH (ref <1.0)

## 2022-05-02 MED ORDER — LOPERAMIDE HCL 2 MG PO CAPS
2.0000 mg | ORAL_CAPSULE | Freq: Two times a day (BID) | ORAL | Status: DC | PRN
  Administered 2022-05-02 – 2022-05-04 (×4): 2 mg via ORAL
  Filled 2022-05-02 (×4): qty 1

## 2022-05-02 MED ORDER — PREDNISONE 20 MG PO TABS
40.0000 mg | ORAL_TABLET | Freq: Every day | ORAL | Status: DC
  Administered 2022-05-03: 40 mg via ORAL
  Filled 2022-05-02 (×2): qty 2

## 2022-05-02 MED ORDER — ENSURE ENLIVE PO LIQD
237.0000 mL | Freq: Two times a day (BID) | ORAL | Status: DC
  Administered 2022-05-02 – 2022-05-10 (×9): 237 mL via ORAL

## 2022-05-02 MED ORDER — FUROSEMIDE 10 MG/ML IJ SOLN
40.0000 mg | Freq: Once | INTRAMUSCULAR | Status: AC
Start: 2022-05-02 — End: 2022-05-02
  Administered 2022-05-02: 40 mg via INTRAVENOUS
  Filled 2022-05-02: qty 4

## 2022-05-02 MED ORDER — CHLORHEXIDINE GLUCONATE CLOTH 2 % EX PADS
6.0000 | MEDICATED_PAD | Freq: Every day | CUTANEOUS | Status: DC
  Administered 2022-05-02 – 2022-05-14 (×10): 6 via TOPICAL

## 2022-05-02 MED ORDER — VANCOMYCIN HCL 125 MG PO CAPS
125.0000 mg | ORAL_CAPSULE | Freq: Four times a day (QID) | ORAL | Status: DC
  Filled 2022-05-02 (×2): qty 1

## 2022-05-02 MED ORDER — MAGNESIUM SULFATE 2 GM/50ML IV SOLN
2.0000 g | Freq: Once | INTRAVENOUS | Status: AC
Start: 2022-05-02 — End: 2022-05-02
  Administered 2022-05-02: 2 g via INTRAVENOUS
  Filled 2022-05-02: qty 50

## 2022-05-02 MED ORDER — POTASSIUM CHLORIDE 10 MEQ/100ML IV SOLN
10.0000 meq | INTRAVENOUS | Status: AC
  Administered 2022-05-02 – 2022-05-03 (×5): 10 meq via INTRAVENOUS
  Filled 2022-05-02 (×5): qty 100

## 2022-05-02 NOTE — ED Notes (Addendum)
Pt wanting to take bipap off and go back to [facility]. Per Randol Kern NP, okay to take off and put back  on [place] 9L. RT made aware

## 2022-05-02 NOTE — Consult Note (Signed)
NAME: [name]  DOB: Feb 17, 1936  MRN: [id number]  Date/Time: 05/02/2022 2:07 PM  REQUESTING PROVIDER: Dr.Lai Subjective:  REASON FOR CONSULT: pneumonia h/o cdiff,  ? [name] is a 86 y.o. with a history of ILD, Chronic hypoxic resp failure, UC on entyvio, recurrent cdiff and receceived FMT feb 2023 presented with worsening shortness of breath over the past few days, but has been feeling more SOB than usual for weeks. She had even increased her oxygen to 4 liters. As per patient and husband no fever, no chills no cough no sputum She recently;y started on entyvio-vedoluzimab for ulcerative colitis and has received 2 infusions so far   05/01/22  BP 131/79  Temp 98.5 F (36.9 C)  Pulse Rate 94  Resp 33 (H)  SpO2 98 %    Latest Reference Range & Units 05/01/22  WBC 4.0 - 10.5 K/uL 19.5 (H)  Hemoglobin 12.0 - 15.0 g/dL 12.8  HCT 36.0 - 46.0 % 41.5  Platelets 150 - 400 K/uL 432 (H)  Creatinine 0.44 - 1.00 mg/dL 0.70   Treated in  07/03/2020 for C. difficile diarrhea.  on 07/11/2020, 03/03/2021,  10/24/2021: S/p Stool transplant for c diff     Past Medical History:  Diagnosis Date   Altered mental state    Anginal pain ([place])    Anxiety    Basal cell carcinoma 05/03/2021   right neck postauricular - Excised 06/12/21   BMI 30.0-30.9,adult    Cellulitis    Chest pain, atypical    Compression fracture of L4 lumbar vertebra    Constipation    COVID-19 virus infection 05/28/2020   Cystitis    Cystocele    Diverticulosis    Fatigue    Fibrosis, idiopathic pulmonary (HCC)    Gastroenteritis    History of back surgery    History of herniated intervertebral disc    HTN (hypertension)    Hypercholesteremia    Hypocalcemia    Incontinence of urine    OA (osteoarthritis)    shoulder and back   Obesity    Osteopenia    Pneumonia    Shortness of breath    Sleep apnea    Squamous cell carcinoma of skin 07/16/2016   Right upper lateral eyebrow. SCCis.   Squamous cell  carcinoma of skin 01/13/2018   Left temporal hairline. WD SCC with superficial infiltration.   Thrush    Vitamin D deficiency     Past Surgical History:  Procedure Laterality Date   BACK SURGERY     CATARACT EXTRACTION W/PHACO Left 04/24/2016   Procedure: CATARACT EXTRACTION PHACO AND INTRAOCULAR LENS PLACEMENT (IOC);  Surgeon: Leandrew Koyanagi, MD;  Location: [place];  Service: Ophthalmology;  Laterality: Left;   CATARACT EXTRACTION W/PHACO Right 07/14/2017   Procedure: CATARACT EXTRACTION PHACO AND INTRAOCULAR LENS PLACEMENT ([place])  RIGHT;  Surgeon: Leandrew Koyanagi, MD;  Location: [place];  Service: Ophthalmology;  Laterality: Right;   CHOLECYSTECTOMY     COLONOSCOPY WITH PROPOFOL N/A 02/01/2022   Procedure: COLONOSCOPY WITH PROPOFOL;  Surgeon: [name], MD;  Location: [facility] END[facility]COPY;  Service: Gastroenterology;  Laterality: N/A;   CYSTOSCOPY/URETEROSCOPY/HOLMIUM LASER/STENT PLACEMENT Left 04/27/2021   Procedure: CYSTOSCOPY/URETEROSCOPY/HOLMIUM LASER/STENT PLACEMENT;  Surgeon: Billey Co, MD;  Location: [facility];  Service: Urology;  Laterality: Left;   ESOPHAGOGASTRODUODENOSCOPY (EGD) WITH PROPOFOL N/A 10/13/2018   Procedure: ESOPHAGOGASTRODUODENOSCOPY (EGD) WITH PROPOFOL;  Surgeon: [name], MD;  Location: [facility] ENDOSCOPY;  Service: Gastroenterology;  Laterality: N/A;   ESOPHAGOGASTRODUODENOSCOPY (EGD) WITH  PROPOFOL N/A 11/22/2021   Procedure: ESOPHAGOGASTRODUODENOSCOPY (EGD) WITH PROPOFOL;  Surgeon: [name], MD;  Location: [facility] ENDOSCOPY;  Service: Gastroenterology;  Laterality: N/A;   EYE SURGERY     FLEXIBLE SIGMOIDOSCOPY N/A 10/13/2018   Procedure: FLEXIBLE SIGMOIDOSCOPY;  Surgeon: [name], MD;  Location: [facility] ENDOSCOPY;  Service: Gastroenterology;  Laterality: N/A;   FLEXIBLE SIGMOIDOSCOPY N/A 09/09/2019   Procedure: FLEXIBLE SIGMOIDOSCOPY;  Surgeon: [name], MD;  Location: [facility] ENDOSCOPY;  Service: Gastroenterology;  Laterality:  N/A;   FLEXIBLE SIGMOIDOSCOPY N/A 08/04/2020   Procedure: FLEXIBLE SIGMOIDOSCOPY;  Surgeon: [name], MD;  Location: [facility] ENDOSCOPY;  Service: Gastroenterology;  Laterality: N/A;  Per Dr. Vicente Males, unsedated   [place] N/A 08/31/2021   Procedure: FLEXIBLE SIGMOIDOSCOPY;  Surgeon: [name], MD;  Location: [facility] ENDOSCOPY;  Service: Gastroenterology;  Laterality: N/A;  no sedation   HIP ARTHROPLASTY Right 09/27/2020   Procedure: ARTHROPLASTY BIPOLAR HIP (HEMIARTHROPLASTY);  Surgeon: Hessie Knows, MD;  Location: [facility];  Service: Orthopedics;  Laterality: Right;   JOINT REPLACEMENT     ROTATOR CUFF REPAIR Bilateral    left-12/2009; right-03/2009 dr.califf   TONSILLECTOMY     TOTAL SHOULDER REPLACEMENT Left     Social History   Socioeconomic History   Marital status: Married    Spouse name: [name]   Number of children: 2   Years of education: Not on file   Highest education level: Not on file  Occupational History   Not on file  Tobacco Use   Smoking status: Never   Smokeless tobacco: Never  Vaping Use   Vaping Use: Never used  Substance and Sexual Activity   Alcohol use: No   Drug use: No   Sexual activity: Never    Birth control/protection: None  Other Topics Concern   Not on file  Social History Narrative   Not on file   Social Determinants of Health   Financial Resource Strain: Low Risk  (02/19/2022)   Overall Financial Resource Strain (CARDIA)    Difficulty of Paying Living Expenses: Not hard at all  Food Insecurity: No Food Insecurity (02/19/2022)   Hunger Vital Sign    Worried About Running Out of Food in the Last Year: Never true    Ran Out of Food in the Last Year: Never true  Transportation Needs: No Transportation Needs (02/19/2022)   PRAPARE - Hydrologist (Medical): No    Lack of Transportation (Non-Medical): No  Physical Activity: Inactive (02/19/2022)   Exercise Vital Sign    Days of Exercise per Week: 0 days     Minutes of Exercise per Session: 0 min  Stress: No Stress Concern Present (02/19/2022)   [place]    Feeling of Stress : Only a little  Social Connections: Moderately Integrated (02/19/2022)   Social Connection and Isolation Panel [NHANES]    Frequency of Communication with Friends and Family: More than three times a week    Frequency of Social Gatherings with Friends and Family: Twice a week    Attends Religious Services: More than 4 times per year    Active Member of [employer] or Organizations: No    Attends Archivist Meetings: Never    Marital Status: Married  Human resources officer Violence: Not At Risk (02/19/2022)   Humiliation, Afraid, Rape, and Kick questionnaire    Fear of Current or Ex-Partner: No    Emotionally Abused: No    Physically Abused: No  Sexually Abused: No    Family History  Problem Relation Age of Onset   COPD Mother    Cancer Brother        colon   No Known Allergies I? Current Facility-Administered Medications  Medication Dose Route Frequency Provider Last Rate Last Admin   0.9 %  sodium chloride infusion  250 mL Intravenous PRN Wouk, Ailene Rud, MD       apixaban Arne Cleveland) tablet 10 mg  10 mg Oral BID Benita Gutter, RPH   10 mg at 05/02/22 3888   Followed by   Derrill Memo ON 05/08/2022] apixaban (ELIQUIS) tablet 5 mg  5 mg Oral BID Benita Gutter, RPH       ceFEPIme (MAXIPIME) 2 g in sodium chloride 0.9 % 100 mL IVPB  2 g Intravenous Q12H Benita Gutter, RPH 200 mL/hr at 05/02/22 2800 2 g at 05/02/22 3491   Chlorhexidine Gluconate Cloth 2 % PADS 6 each  6 each Topical Daily Enzo Bi, MD   6 each at 05/02/22 7915   citalopram (CELEXA) tablet 40 mg  40 mg Oral Daily Gwynne Edinger, MD   40 mg at 05/02/22 0569   feeding supplement (ENSURE ENLIVE / ENSURE PLUS) liquid 237 mL  237 mL Oral BID BM Enzo Bi, MD       HYDROcodone-acetaminophen (NORCO/VICODIN) 5-325 MG per tablet 1 tablet   1 tablet Oral TID PRN Gwynne Edinger, MD   1 tablet at 05/01/22 1517   loperamide (IMODIUM) capsule 2 mg  2 mg Oral BID PRN Enzo Bi, MD       pantoprazole (PROTONIX) EC tablet 40 mg  40 mg Oral Daily Gwynne Edinger, MD   40 mg at 05/02/22 7948   Pirfenidone TABS 267 mg  267 mg Oral TID WC Wouk, Ailene Rud, MD       predniSONE (DELTASONE) tablet 50 mg  50 mg Oral Q breakfast Gwynne Edinger, MD   50 mg at 05/02/22 0165   sodium chloride flush (NS) 0.9 % injection 3 mL  3 mL Intravenous Q12H Gwynne Edinger, MD   3 mL at 05/02/22 5374   sodium chloride flush (NS) 0.9 % injection 3 mL  3 mL Intravenous PRN Wouk, Ailene Rud, MD         Abtx:  Anti-infectives (From admission, onward)    Start     Dose/Rate Route Frequency Ordered Stop   05/01/22 1600  ceFEPIme (MAXIPIME) 2 g in sodium chloride 0.9 % 100 mL IVPB        2 g 200 mL/hr over 30 Minutes Intravenous Every 12 hours 05/01/22 1340     05/01/22 1000  cefTRIAXone (ROCEPHIN) 1 g in sodium chloride 0.9 % 100 mL IVPB        1 g 200 mL/hr over 30 Minutes Intravenous  Once 05/01/22 0952 05/01/22 1126   05/01/22 1000  azithromycin (ZITHROMAX) 500 mg in sodium chloride 0.9 % 250 mL IVPB        500 mg 250 mL/hr over 60 Minutes Intravenous  Once 05/01/22 0952 05/01/22 1246       REVIEW OF SYSTEMS:  Const: negative fever, negative chills, some weight loss Eyes: negative diplopia or visual changes, negative eye pain ENT: negative coryza, negative sore throat Resp: negative cough, no hemoptysis, +++dyspnea Cards: negative for chest pain, palpitations, lower extremity edema GU: negative for frequency, dysuria and hematuria GI: Negative for abdominal pain, diarrhea, bleeding, constipation Skin: negative for rash and  pruritus Heme: negative for easy bruising and gum/nose bleeding MS: generalized weakness- poor mobility in the past few days Neurolo:dizziness Psych:  anxiety, Endocrine: negative for thyroid,  diabetes Allergy/Immunology- negative for any medication or food allergies ? : Objective:  VITALS:  BP 119/78 (BP Location: Left Arm)   Pulse 89   Temp 97.6 F (36.4 C) (Oral)   Resp 14   Ht 4' 10"  (1.473 m)   Wt 49.4 kg   SpO2 99%   BMI 22.78 kg/m   PHYSICAL EXAM:  General: awake, cooperative  no distress, appears stated age.  Head: Normocephalic, without obvious abnormality, atraumatic. Eyes: Conjunctivae clear, anicteric sclerae. Pupils are equal ENT Nares normal. No drainage or sinus tenderness. Lips, mucosa, and tongue normal. No Thrush Neck: , symmetrical, no adenopathy, thyroid: non tender Lungs: b/l air entry Creps bases Heart: Regular rate and rhythm, no murmur, rub or gallop. Abdomen: Soft, non-tender,not distended. Bowel sounds normal. No masses Extremities: atraumatic, no cyanosis. No edema. No clubbing Skin: No rashes or lesions. Or bruising Lymph: Cervical, supraclavicular normal. Neurologic: Grossly non-focal Pertinent Labs Lab Results CBC    Component Value Date/Time   WBC 10.8 (H) 05/02/2022 1155   RBC 4.47 05/02/2022 1155   HGB 12.5 05/02/2022 1155   HGB 11.7 02/04/2022 1342   HCT 39.4 05/02/2022 1155   HCT 35.9 02/04/2022 1342   PLT 350 05/02/2022 1155   PLT 457 (H) 02/04/2022 1342   MCV 88.1 05/02/2022 1155   MCV 88 02/04/2022 1342   MCV 92 02/09/2014 0500   MCH 28.0 05/02/2022 1155   MCHC 31.7 05/02/2022 1155   RDW 15.6 (H) 05/02/2022 1155   RDW 12.5 02/04/2022 1342   RDW 14.0 02/09/2014 0500   LYMPHSABS 1.5 05/01/2022 0759   LYMPHSABS 1.5 02/04/2022 1342   LYMPHSABS 1.6 02/09/2014 0500   MONOABS 2.0 (H) 05/01/2022 0759   MONOABS 1.1 (H) 02/09/2014 0500   EOSABS 0.5 05/01/2022 0759   EOSABS 0.9 (H) 02/04/2022 1342   EOSABS 0.5 02/09/2014 0500   BASOSABS 0.1 05/01/2022 0759   BASOSABS 0.1 02/04/2022 1342   BASOSABS 0.1 02/09/2014 0500       Latest Ref Rng & Units 05/02/2022   11:55 AM 05/01/2022    7:59 AM 10/19/2021    3:48 PM   CMP  Glucose 70 - 99 mg/dL 114  85    BUN 8 - 23 mg/dL 15  15    Creatinine 0.44 - 1.00 mg/dL 0.76  0.70  0.80   Sodium 135 - 145 mmol/L 139  138    Potassium 3.5 - 5.1 mmol/L 2.9  3.8    Chloride 98 - 111 mmol/L 99  104    CO2 22 - 32 mmol/L 27  22    Calcium 8.9 - 10.3 mg/dL 8.4  8.6    Total Protein 6.5 - 8.1 g/dL  6.4    Total Bilirubin 0.3 - 1.2 mg/dL  0.7    Alkaline Phos 38 - 126 U/L  94    AST 15 - 41 U/L  30    ALT 0 - 44 U/L  7        Microbiology: Recent Results (from the past 240 hour(s))  SARS Coronavirus 2 by RT PCR (hospital order, performed in [facility] hospital lab) *cepheid single result test* Anterior Nasal Swab     Status: None   Collection Time: 05/01/22  8:03 AM   Specimen: Anterior Nasal Swab  Result Value Ref Range Status  SARS Coronavirus 2 by RT PCR NEGATIVE NEGATIVE Final    Comment: (NOTE) SARS-CoV-2 target nucleic acids are NOT DETECTED.  The SARS-CoV-2 RNA is generally detectable in upper and lower respiratory specimens during the acute phase of infection. The lowest concentration of SARS-CoV-2 viral copies this assay can detect is 250 copies / mL. A negative result does not preclude SARS-CoV-2 infection and should not be used as the sole basis for treatment or other patient management decisions.  A negative result may occur with improper specimen collection / handling, submission of specimen other than nasopharyngeal swab, presence of viral mutation(s) within the areas targeted by this assay, and inadequate number of viral copies (<250 copies / mL). A negative result must be combined with clinical observations, patient history, and epidemiological information.  Fact Sheet for Patients:   https://www.patel.info/  Fact Sheet for Healthcare Providers: https://hall.com/  This test is not yet approved or  cleared by the [place] FDA and has been authorized for detection and/or diagnosis of  SARS-CoV-2 by FDA under an Emergency Use Authorization (EUA).  This EUA will remain in effect (meaning this test can be used) for the duration of the COVID-19 declaration under Section 564(b)(1) of the Act, 21 U.S.C. section 360bbb-3(b)(1), unless the authorization is terminated or revoked sooner.  Performed at [facility], [address] [place], [place] [address]   Blood culture (routine x 2)     Status: None (Preliminary result)   Collection Time: 05/01/22 10:46 AM   Specimen: BLOOD  Result Value Ref Range Status   Specimen Description   Final    BLOOD Blood Culture adequate volume Performed at [facility], [address] [place], [place] [address]    Special Requests   Final    BOTTLES DRAWN AEROBIC AND ANAEROBIC RIGHT ANTECUBITAL Performed at [facility], [address] [place], [place] [address]    Culture  Setup Time   Final    GRAM POSITIVE COCCI AEROBIC BOTTLE ONLY CRITICAL RESULT CALLED TO, READ BACK BY AND VERIFIED WITH: Lu Duffel [facility] [id number] 05/02/22 HNM Performed at [facility], [place] [address], [place],  [address]    Culture GRAM POSITIVE COCCI  Final   Report Status PENDING  Incomplete  Blood Culture ID Panel (Reflexed)     Status: Abnormal   Collection Time: 05/01/22 10:46 AM  Result Value Ref Range Status   Enterococcus faecalis NOT DETECTED NOT DETECTED Final   Enterococcus Faecium NOT DETECTED NOT DETECTED Final   Listeria monocytogenes NOT DETECTED NOT DETECTED Final   Staphylococcus species DETECTED (A) NOT DETECTED Final    Comment: CRITICAL RESULT CALLED TO, READ BACK BY AND VERIFIED WITH: Lu Duffel PHARMD [id number] 05/02/22 HNM    Staphylococcus aureus (BCID) NOT DETECTED NOT DETECTED Final   Staphylococcus epidermidis DETECTED (A) NOT DETECTED Final    Comment: Methicillin (oxacillin) resistant coagulase negative staphylococcus. Possible blood culture contaminant (unless isolated from more than one  blood culture draw or clinical case suggests pathogenicity). No antibiotic treatment is indicated for blood  culture contaminants. CRITICAL RESULT CALLED TO, READ BACK BY AND VERIFIED WITH: Lu Duffel PHARMD [id number] 05/02/22 HNM    Staphylococcus lugdunensis NOT DETECTED NOT DETECTED Final   Streptococcus species NOT DETECTED NOT DETECTED Final   Streptococcus agalactiae NOT DETECTED NOT DETECTED Final   Streptococcus pneumoniae NOT DETECTED NOT DETECTED Final   Streptococcus pyogenes NOT DETECTED NOT DETECTED Final   A.calcoaceticus-baumannii NOT DETECTED NOT DETECTED Final   Bacteroides  fragilis NOT DETECTED NOT DETECTED Final   Enterobacterales NOT DETECTED NOT DETECTED Final   Enterobacter cloacae complex NOT DETECTED NOT DETECTED Final   Escherichia coli NOT DETECTED NOT DETECTED Final   Klebsiella aerogenes NOT DETECTED NOT DETECTED Final   Klebsiella oxytoca NOT DETECTED NOT DETECTED Final   Klebsiella pneumoniae NOT DETECTED NOT DETECTED Final   Proteus species NOT DETECTED NOT DETECTED Final   Salmonella species NOT DETECTED NOT DETECTED Final   Serratia marcescens NOT DETECTED NOT DETECTED Final   Haemophilus influenzae NOT DETECTED NOT DETECTED Final   Neisseria meningitidis NOT DETECTED NOT DETECTED Final   Pseudomonas aeruginosa NOT DETECTED NOT DETECTED Final   Stenotrophomonas maltophilia NOT DETECTED NOT DETECTED Final   Candida albicans NOT DETECTED NOT DETECTED Final   Candida auris NOT DETECTED NOT DETECTED Final   Candida glabrata NOT DETECTED NOT DETECTED Final   Candida krusei NOT DETECTED NOT DETECTED Final   Candida parapsilosis NOT DETECTED NOT DETECTED Final   Candida tropicalis NOT DETECTED NOT DETECTED Final   Cryptococcus neoformans/gattii NOT DETECTED NOT DETECTED Final   Methicillin resistance mecA/C DETECTED (A) NOT DETECTED Final    Comment: CRITICAL RESULT CALLED TO, READ BACK BY AND VERIFIED WITHLu Duffel [facility] 2353 05/02/22 HNM Performed  at [facility], [address] [place], [place] [address]   Blood culture (routine x 2)     Status: None (Preliminary result)   Collection Time: 05/01/22 10:48 AM   Specimen: BLOOD  Result Value Ref Range Status   Specimen Description BLOOD BLOOD LEFT ARM  Final   Special Requests   Final    BOTTLES DRAWN AEROBIC AND ANAEROBIC Blood Culture adequate volume   Culture   Final    NO GROWTH < 24 HOURS Performed at [facility], [address] [place], [place] [address]    Report Status PENDING  Incomplete  Respiratory (~20 pathogens) panel by PCR     Status: None   Collection Time: 05/01/22  3:31 PM   Specimen: Nasopharyngeal Swab; Respiratory  Result Value Ref Range Status   Adenovirus NOT DETECTED NOT DETECTED Final   Coronavirus 229E NOT DETECTED NOT DETECTED Final    Comment: (NOTE) The Coronavirus on the Respiratory Panel, DOES NOT test for the novel  Coronavirus (2019 nCoV)    Coronavirus HKU1 NOT DETECTED NOT DETECTED Final   Coronavirus NL63 NOT DETECTED NOT DETECTED Final   Coronavirus OC43 NOT DETECTED NOT DETECTED Final   Metapneumovirus NOT DETECTED NOT DETECTED Final   Rhinovirus / Enterovirus NOT DETECTED NOT DETECTED Final   Influenza A NOT DETECTED NOT DETECTED Final   Influenza B NOT DETECTED NOT DETECTED Final   Parainfluenza Virus 1 NOT DETECTED NOT DETECTED Final   Parainfluenza Virus 2 NOT DETECTED NOT DETECTED Final   Parainfluenza Virus 3 NOT DETECTED NOT DETECTED Final   Parainfluenza Virus 4 NOT DETECTED NOT DETECTED Final   Respiratory Syncytial Virus NOT DETECTED NOT DETECTED Final   Bordetella pertussis NOT DETECTED NOT DETECTED Final   Bordetella Parapertussis NOT DETECTED NOT DETECTED Final   Chlamydophila pneumoniae NOT DETECTED NOT DETECTED Final   Mycoplasma pneumoniae NOT DETECTED NOT DETECTED Final    Comment: Performed at [facility], [address], [place], [place] [address]    IMAGING RESULTS:  I have  personally reviewed the films ?b/l Ground glass appearance  Impression/Recommendation Acute on chronic hypoxic resp failure COVID. Pesp viral panel neg? Small PE interstitial lung disease on home oxygen  now with worsening sob. No pneumonia But has b/l GGO on CT- ? Element of fluid overload with underlying fibrosis Normal procal Currently on cefepime She has had recurrent Cdiff and has undergone FMT So the risk of antibiotic outweighs benefit currently;y So Dc cefepime On prednisone  Acute on chronic diastolic CHF Small PE on eliquis  H/o recurrent cdiff needing FMT- avoid antibiotics as much as possible If needed then would do empiric vanco PO  Ulcerative colitis on entyvio  ? ___________________________________________________ Discussed with patient, and her husband and care team   Note:  This document was prepared using Dragon voice recognition software and may include unintentional dictation errors.

## 2022-05-02 NOTE — TOC Initial Note (Signed)
Transition of Care [facility]) - Initial/Assessment Note    Patient Details  Name: [name] MRN: [id number] Date of Birth: Jan 31, 1936  Transition of Care [facility]) CM/SW Contact:    Candie Chroman, LCSW Phone Number: 05/02/2022, 11:58 AM  Clinical Narrative:  CSW met with patient. No supports at bedside. CSW introduced role and explained that PT recommendations would be discussed. She is agreeable to home health. No agency preference. Checked with [facility] since they worked with her last year but they are not currently in network with her insurance. Set up with [facility] for PT. Patient is aware and agreeable. Will let them know if we need to add a nurse at discharge. Patient on home oxygen through [place]. Their orders are for 2 L but patient increased it to 5 L. Will need new orders and sats test at discharge if she requires more than 2 L. No further concerns. CSW encouraged patient to contact CSW as needed. CSW will continue to follow patient for support and facilitate return home once stable. Her husband will transport her home at discharge.                Expected Discharge Plan: [place] Barriers to Discharge: Continued Medical Work up   Patient Goals and CMS Choice   CMS Medicare.gov Compare Post Acute Care list provided to:: Patient    Expected Discharge Plan and Services Expected Discharge Plan: [place] Choice: [place] arrangements for the past 2 months: Single Family Home                                      Prior Living Arrangements/Services Living arrangements for the past 2 months: Single Family Home Lives with:: Spouse Patient language and need for interpreter reviewed:: Yes Do you feel safe going back to the place where you live?: Yes      Need for Family Participation in Patient Care: Yes (Comment) Care giver support system in place?: Yes (comment) Current home services:  DME Criminal Activity/Legal Involvement Pertinent to Current Situation/Hospitalization: No - Comment as needed  Activities of Daily Living      Permission Sought/Granted Permission sought to share information with : Facility Art therapist granted to share information with : Yes, Verbal Permission Granted     Permission granted to share info w AGENCY: [place], [employer]        Emotional Assessment Appearance:: Appears stated age Attitude/Demeanor/Rapport: Engaged, Gracious Affect (typically observed): Accepting, Appropriate, Calm, Pleasant Orientation: : Oriented to Self, Oriented to Place, Oriented to  Time, Oriented to Situation Alcohol / Substance Use: Not Applicable Psych Involvement: No (comment)  Admission diagnosis:  CAP (community acquired pneumonia) [J18.9] Pneumonia due to infectious organism, unspecified laterality, unspecified part of lung [J18.9] Patient Active Problem List   Diagnosis Date Noted   CAP (community acquired pneumonia) 05/01/2022   Acute pulmonary embolism ([place]) 05/01/2022   Ulcerative colitis ([place]) 02/26/2022   Preventative health care 02/26/2022   Fall (06/28/2021) 07/03/2021   Chronic shoulder pain (Left) 05/17/2021   History of total shoulder replacement (Left) 05/17/2021   Pain in shoulder region after shoulder replacement (Left) 05/17/2021   History of arthroplasty of shoulder (Left) 05/17/2021   Osteoporosis with current pathological fracture, sequela 04/24/2021   Osteoporosis with pathological fracture of lumbar vertebra ([address]) 04/24/2021  Trigger point of shoulder region (Left) 04/24/2021   Tendinitis of triceps (Left) 04/24/2021   Chronic upper back pain 04/24/2021   Chronic thoracic back pain (Left) 04/24/2021   Trigger point with back pain 04/24/2021   Lower extremity weakness (Bilateral) 04/24/2021   Myofascial pain syndrome of lumbar spine 04/24/2021   Myofascial pain syndrome of thoracic spine  04/24/2021   Cervicalgia 04/24/2021   Myalgia, other site 04/24/2021   Chronic radicular pain of lower back 02/13/2021   Lumbar spondylosis 02/13/2021   Lumbosacral facet arthropathy (Multilevel) (Bilateral) 01/16/2021   Lumbar facet syndrome 01/16/2021   Spondylosis without myelopathy or radiculopathy, lumbosacral region 01/16/2021   DDD (degenerative disc disease), lumbosacral 01/16/2021   DDD (degenerative disc disease), thoracolumbar 01/16/2021   Chronic use of opiate for therapeutic purpose 01/16/2021   Abnormal MRI, lumbar spine (12/06/2020) 01/15/2021   Compression fracture of T12 thoracic vertebra (w/ retropulsion), sequela 01/15/2021   Compression fracture of L1 lumbar vertebra, sequela 01/15/2021   Compression fracture of L3 lumbar vertebra, sequela 01/15/2021   Chronic pain syndrome 01/03/2021   Pharmacologic therapy 01/03/2021   Disorder of skeletal system 01/03/2021   Problems influencing health status 01/03/2021   Chronic groin pain (2ry area of Pain) (Right) 01/03/2021   Chronic low back pain (1ry area of Pain) (Bilateral) (L>R) w/o sciatica 01/03/2021   Chronic hip pain after total replacement (Right) 01/03/2021   Chronic shoulder pain (3ry area of Pain) (Bilateral) (L>R) 01/03/2021   Persistent cough for 3 weeks or longer 12/20/2020   C. 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

## 2022-05-02 NOTE — Progress Notes (Signed)
PULMONOLOGY         Date: 05/02/2022,   MRN# [id number] [name] 07-21-1936     AdmissionWeight: 49.4 kg                 CurrentWeight: 49.4 kg  Referring provider: Dr Si Raider   CHIEF COMPLAINT:   Acute on chronic hypoxemic respiratory failure   HISTORY OF PRESENT ILLNESS   This is a pleasant 86 yo F with hx of UC, ILD with Pulmonary fibrosis, chronic pain syndrome, who came in due to acute on chronic hypoxemia. She reports increased O2 requirement at home with worsening cough, she denies flu like illness or fevers.  Labwork reveals elevated BNP   05/02/22 - patient is improved clinically, RVP is negative , does not appear to have ongoing infection s/p ID eval with de-escalation of antimicrobials. Placed on eliquis for acute PE with mild clot burden.  CRP and ESR was not collected and I have reordered this. She is nontachypneic during exam speaking in full sentences. Mild hypokalemia on labs has been repleted and repleted magnesium as well.    PAST MEDICAL HISTORY   Past Medical History:  Diagnosis Date   Altered mental state    Anginal pain ([place])    Anxiety    Basal cell carcinoma 05/03/2021   right neck postauricular - Excised 06/12/21   BMI 30.0-30.9,adult    Cellulitis    Chest pain, atypical    Compression fracture of L4 lumbar vertebra    Constipation    COVID-19 virus infection 05/28/2020   Cystitis    Cystocele    Diverticulosis    Fatigue    Fibrosis, idiopathic pulmonary (HCC)    Gastroenteritis    History of back surgery    History of herniated intervertebral disc    HTN (hypertension)    Hypercholesteremia    Hypocalcemia    Incontinence of urine    OA (osteoarthritis)    shoulder and back   Obesity    Osteopenia    Pneumonia    Shortness of breath    Sleep apnea    Squamous cell carcinoma of skin 07/16/2016   Right upper lateral eyebrow. SCCis.   Squamous cell carcinoma of skin 01/13/2018   Left temporal hairline. WD SCC with  superficial infiltration.   Thrush    Vitamin D deficiency      SURGICAL HISTORY   Past Surgical History:  Procedure Laterality Date   BACK SURGERY     CATARACT EXTRACTION W/PHACO Left 04/24/2016   Procedure: CATARACT EXTRACTION PHACO AND INTRAOCULAR LENS PLACEMENT (IOC);  Surgeon: Leandrew Koyanagi, MD;  Location: [place];  Service: Ophthalmology;  Laterality: Left;   CATARACT EXTRACTION W/PHACO Right 07/14/2017   Procedure: CATARACT EXTRACTION PHACO AND INTRAOCULAR LENS PLACEMENT ([place])  RIGHT;  Surgeon: Leandrew Koyanagi, MD;  Location: [place];  Service: Ophthalmology;  Laterality: Right;   CHOLECYSTECTOMY     COLONOSCOPY WITH PROPOFOL N/A 02/01/2022   Procedure: COLONOSCOPY WITH PROPOFOL;  Surgeon: [name], MD;  Location[facility]COPY;  Service: Gastroenterology;  Laterality: N/A;   CYSTOSCOPY/URETEROSCOPY/HOLMIUM LASER/STENT PLACEMENT Left 04/27/2021   Procedure: CYSTOSCOPY/URETEROSCOPY/HOLMIUM LASER/STENT PLACEMENT;  Surgeon: Billey Co, MD;  Location: [facility];  Service: Urology;  Laterality: Left;   ESOPHAGOGASTRODUODENOSCOPY (EGD) WITH PROPOFOL N/A 10/13/2018   Procedure: ESOPHAGOGASTRODUODENOSCOPY (EGD) WITH PROPOFOL;  Surgeon: [name], MD;  [facility];  Service: Gastroenterology;  Laterality: N/A;   ESOPHAGOGASTRODUODENOSCOPY (EGD) WITH PROPOFOL N/A 11/22/2021  Procedure: ESOPHAGOGASTRODUODENOSCOPY (EGD) WITH PROPOFOL;  Surgeon: [name], MD;  Location: [facility] ENDOSCOPY;  Service: Gastroenterology;  Laterality: N/A;   EYE SURGERY     FLEXIBLE SIGMOIDOSCOPY N/A 10/13/2018   Procedure: FLEXIBLE SIGMOIDOSCOPY;  Surgeon: [name], MD;  Location: [facility]COPY;  Service: Gastroenterology;  Laterality: N/A;   FLEXIBLE SIGMOIDOSCOPY N/A 09/09/2019   Procedure: FLEXIBLE SIGMOIDOSCOPY;  Surgeon: [name], MD;  Location: [facility] END[facility]COPY;  Service: Gastroenterology;  Laterality: N/A;   FLEXIBLE SIGMOIDOSCOPY N/A 08/04/2020    Procedure: FLEXIBLE SIGMOIDOSCOPY;  Surgeon: [name], MD;  Location: [facility] ENDOSCOPY;  Service: Gastroenterology;  Laterality: N/A;  Per Dr. Vicente Males, unsedated   [place] N/A 08/31/2021   Procedure: FLEXIBLE SIGMOIDOSCOPY;  Surgeon: [name], MD;  Location: [facility] ENDOSCOPY;  Service: Gastroenterology;  Laterality: N/A;  no sedation   HIP ARTHROPLASTY Right 09/27/2020   Procedure: ARTHROPLASTY BIPOLAR HIP (HEMIARTHROPLASTY);  Surgeon: Hessie Knows, MD;  Location: [facility];  Service: Orthopedics;  Laterality: Right;   JOINT REPLACEMENT     ROTATOR CUFF REPAIR Bilateral    left-12/2009; right-03/2009 dr.califf   TONSILLECTOMY     TOTAL SHOULDER REPLACEMENT Left      FAMILY HISTORY   Family History  Problem Relation Age of Onset   COPD Mother    Cancer Brother        colon     SOCIAL HISTORY   Social History   Tobacco Use   Smoking status: Never   Smokeless tobacco: Never  Vaping Use   Vaping Use: Never used  Substance Use Topics   Alcohol use: No   Drug use: No     MEDICATIONS    Home Medication:     Current Medication:  Current Facility-Administered Medications:    0.9 %  sodium chloride infusion, 250 mL, Intravenous, PRN, Wouk, Ailene Rud, MD   apixaban (ELIQUIS) tablet 10 mg, 10 mg, Oral, BID, 10 mg at 05/02/22 0902 **FOLLOWED BY** [START ON 05/08/2022] apixaban (ELIQUIS) tablet 5 mg, 5 mg, Oral, BID, Benita Gutter, RPH   Chlorhexidine Gluconate Cloth 2 % PADS 6 each, 6 each, Topical, Daily, Enzo Bi, MD, 6 each at 05/02/22 0953   citalopram (CELEXA) tablet 40 mg, 40 mg, Oral, Daily, Wouk, Ailene Rud, MD, 40 mg at 05/02/22 0902   feeding supplement (ENSURE ENLIVE / ENSURE PLUS) liquid 237 mL, 237 mL, Oral, BID BM, Enzo Bi, MD, 237 mL at 05/02/22 1500   HYDROcodone-acetaminophen (NORCO/VICODIN) 5-325 MG per tablet 1 tablet, 1 tablet, Oral, TID PRN, Gwynne Edinger, MD, 1 tablet at 05/01/22 1517   loperamide (IMODIUM) capsule 2 mg, 2 mg,  Oral, BID PRN, Enzo Bi, MD, 2 mg at 05/02/22 1454   pantoprazole (PROTONIX) EC tablet 40 mg, 40 mg, Oral, Daily, Wouk, Ailene Rud, MD, 40 mg at 05/02/22 9622   Pirfenidone TABS 267 mg, 267 mg, Oral, TID WC, Wouk, Ailene Rud, MD   predniSONE (DELTASONE) tablet 50 mg, 50 mg, Oral, Q breakfast, Wouk, Ailene Rud, MD, 50 mg at 05/02/22 2979   sodium chloride flush (NS) 0.9 % injection 3 mL, 3 mL, Intravenous, Q12H, Wouk, Ailene Rud, MD, 3 mL at 05/02/22 0904   sodium chloride flush (NS) 0.9 % injection 3 mL, 3 mL, Intravenous, PRN, Wouk, Ailene Rud, MD   vancomycin (VANCOCIN) capsule 125 mg, 125 mg, Oral, QID, Tsosie Billing, MD    ALLERGIES   Patient has no known allergies.     REVIEW OF SYSTEMS    Review of Systems:  Gen:  Denies  fever, sweats, chills weigh loss  HEENT: Denies blurred vision, double vision, ear pain, eye pain, hearing loss, nose bleeds, sore throat Cardiac:  No dizziness, chest pain or heaviness, chest tightness,edema Resp:   reports dyspnea chronically  Gi: Denies swallowing difficulty, stomach pain, nausea or vomiting, diarrhea, constipation, bowel incontinence Gu:  Denies bladder incontinence, burning urine Ext:   Denies Joint pain, stiffness or swelling Skin: Denies  skin rash, easy bruising or bleeding or hives Endoc:  Denies polyuria, polydipsia , polyphagia or weight change Psych:   Denies depression, insomnia or hallucinations   Other:  All other systems negative   VS: BP 118/72 (BP Location: Right Arm)   Pulse 83   Temp (!) 97.5 F (36.4 C) (Oral)   Resp 16   Ht 4' 10"  (1.473 m)   Wt 49.4 kg   SpO2 94%   BMI 22.78 kg/m      PHYSICAL EXAM    GENERAL:NAD, no fevers, chills, no weakness no fatigue HEAD: Normocephalic, atraumatic.  EYES: Pupils equal, round, reactive to light. Extraocular muscles intact. No scleral icterus.  MOUTH: Moist mucosal membrane. Dentition intact. No abscess noted.  EAR, NOSE, THROAT: Clear  without exudates. No external lesions.  NECK: Supple. No thyromegaly. No nodules. No JVD.  PULMONARY: decreased breath sounds with mild rhonchi worse at bases bilaterally.  CARDIOVASCULAR: S1 and S2. Regular rate and rhythm. No murmurs, rubs, or gallops. No edema. Pedal pulses 2+ bilaterally.  GASTROINTESTINAL: Soft, nontender, nondistended. No masses. Positive bowel sounds. No hepatosplenomegaly.  MUSCULOSKELETAL: No swelling, clubbing, or edema. Range of motion full in all extremities.  NEUROLOGIC: Cranial nerves II through XII are intact. No gross focal neurological deficits. Sensation intact. Reflexes intact.  SKIN: No ulceration, lesions, rashes, or cyanosis. Skin warm and dry. Turgor intact.  PSYCHIATRIC: Mood, affect within normal limits. The patient is awake, alert and oriented x 3. Insight, judgment intact.       IMAGING     ASSESSMENT/PLAN   Acute on chronic hypoxemic respiratory failure    - abnormal CT chest with interval development of inflammatory changes as evidenced with serial CT chest above   - possible overlying interstitial edema   - RVP has been ordered already not yet collected   - ESR/CRP to check on iflammatory changes  -currently on prednisone 1m   - the situation is very difficult because she benefits from steroids with ulcerative colitis and ILD but it potentiates infection including Cdiff so therapy is further complicated   - plan to diruese gently as while treating  - her abdominal scan shows ongoing colitis and she may benefit from ID evaluation since this has been going of for months now.  She is at risk for GI bleed with steroids and ongoing colitis and at risk for toxic megacolon.    Pulmonary fibrosis - chronic   - on esbriet - please continue as current  -PT /OT as able             Thank you for allowing me to participate in the care of this patient.   Patient/Family are satisfied with care plan and all questions have been answered.     Provider disclosure: Patient with at least one acute or chronic illness or injury that poses a threat to life or bodily function and is being managed actively during this encounter.  All of the below services have been performed independently by signing provider:  review of prior documentation from internal and or external  health records.  Review of previous and current lab results.  Interview and comprehensive assessment during patient visit today. Review of current and previous chest radiographs/CT scans. Discussion of management and test interpretation with health care team and patient/family.   This document was prepared using Dragon voice recognition software and may include unintentional dictation errors.     Ottie Glazier, M.D.  Division of Pulmonary & Critical Care Medicine

## 2022-05-02 NOTE — ED Notes (Signed)
Patient had a BM. Cleaned and changed patient. Applied clean brief. Pulled patient up in bed and covered patient with blankets. Patient ok.

## 2022-05-02 NOTE — Care Management Important Message (Signed)
Important Message  Patient Details  Name: [name] MRN: [id number] Date of Birth: 1936-05-21   Medicare Important Message Given:  N/A - LOS <3 / Initial given by admissions     Dannette Barbara 05/02/2022, 12:46 PM

## 2022-05-02 NOTE — Evaluation (Signed)
Physical Therapy Evaluation Patient Details Name: [name] MRN: [id number] DOB: Dec 02, 1935 Today's Date: 05/02/2022  History of Present Illness  Admitted due to dyspnea and palpitations. Reports a decline of several months with acute worsening the past few days. Reports palpitations with minimal movement present for several weeks. No chest pain. Reports having to increase baseline home o2 of 2-3 liters to 4 liters lately, has had several months of hypoxia, and has worsened over past few weeks to being dyspneic at rest worse with minimal ambulation. Slight increased cough, not productive. Imaging revals PE. Per Dr. Fran Lowes it is small and she is ok to participate with PT.   Clinical Impression  Patient received in bed, she is pleasant. Agrees to PT assessment. Patient reports minimal mobility recently at home. Lives with husband and daughter assists. She has walker and cane. Home O2. Patient requires min A with supine>< sit. She is limited by fatigue/weakness and sob. Patient will continue to benefit from skilled PT while here to improve strength and functional independence.         Recommendations for follow up therapy are one component of a multi-disciplinary discharge planning process, led by the attending physician.  Recommendations may be updated based on patient status, additional functional criteria and insurance authorization.  Follow Up Recommendations Home health PT      Assistance Recommended at Discharge Frequent or constant Supervision/Assistance  Patient can return home with the following  A little help with walking and/or transfers;A little help with bathing/dressing/bathroom;Assist for transportation;Assistance with cooking/housework    Equipment Recommendations None recommended by PT  Recommendations for Other Services       Functional Status Assessment Patient has had a recent decline in their functional status and demonstrates the ability to make significant  improvements in function in a reasonable and predictable amount of time.     Precautions / Restrictions Precautions Precautions: Fall Restrictions Weight Bearing Restrictions: No      Mobility  Bed Mobility Overal bed mobility: Needs Assistance Bed Mobility: Supine to Sit, Sit to Supine, Rolling Rolling: Min assist   Supine to sit: Min assist Sit to supine: Min assist   General bed mobility comments: patient requires assistance with bed mobility. Got about halfway sitting and then returned self to supine stating she can't she is too weak.    Transfers                   General transfer comment: unable/unwilling to attempt today    Ambulation/Gait               General Gait Details: unable  Stairs            Wheelchair Mobility    Modified Rankin (Stroke Patients Only)       Balance Overall balance assessment: Needs assistance                                           Pertinent Vitals/Pain Pain Assessment Pain Assessment: No/denies pain    Home Living Family/patient expects to be discharged to:: Private residence Living Arrangements: Spouse/significant other;Children Available Help at Discharge: Family;Available 24 hours/day Type of Home: House Home Access: Level entry       Home Layout: One level Home Equipment: Agricultural consultant (2 wheels);Cane - single point Additional Comments: Uses home O2    Prior Function Prior Level of Function : Needs  assist             Mobility Comments: patient reports she has been unable to walk much lately. Cant remember how long this has been the case. ADLs Comments: requires assist from husband/daughter     Hand Dominance        Extremity/Trunk Assessment   Upper Extremity Assessment Upper Extremity Assessment: Generalized weakness    Lower Extremity Assessment Lower Extremity Assessment: Generalized weakness       Communication   Communication: No difficulties   Cognition Arousal/Alertness: Awake/alert Behavior During Therapy: WFL for tasks assessed/performed Overall Cognitive Status: Within Functional Limits for tasks assessed                                          General Comments      Exercises     Assessment/Plan    PT Assessment Patient needs continued PT services  PT Problem List Decreased strength;Decreased mobility;Decreased activity tolerance;Cardiopulmonary status limiting activity       PT Treatment Interventions DME instruction;Gait training;Functional mobility training;Therapeutic activities;Patient/family education;Therapeutic exercise;Balance training    PT Goals (Current goals can be found in the Care Plan section)  Acute Rehab PT Goals Patient Stated Goal: to return home PT Goal Formulation: With patient Time For Goal Achievement: 05/17/22 Potential to Achieve Goals: Good    Frequency Min 2X/week     Co-evaluation               AM-PAC PT "6 Clicks" Mobility  Outcome Measure Help needed turning from your back to your side while in a flat bed without using bedrails?: A Little Help needed moving from lying on your back to sitting on the side of a flat bed without using bedrails?: A Little Help needed moving to and from a bed to a chair (including a wheelchair)?: A Lot Help needed standing up from a chair using your arms (e.g., wheelchair or bedside chair)?: A Lot Help needed to walk in hospital room?: A Lot Help needed climbing 3-5 steps with a railing? : A Lot 6 Click Score: 14    End of Session Equipment Utilized During Treatment: Oxygen Activity Tolerance: Patient limited by fatigue Patient left: in bed;with call bell/phone within reach;with bed alarm set Nurse Communication: Mobility status PT Visit Diagnosis: Other abnormalities of gait and mobility (R26.89);Muscle weakness (generalized) (M62.81)    Time: 8841-6606 PT Time Calculation (min) (ACUTE ONLY): 21 min   Charges:    PT Evaluation $PT Eval Moderate Complexity: 1 Mod PT Treatments $Therapeutic Activity: 8-22 mins        Porter Nakama, PT, GCS 05/02/22,11:32 AM

## 2022-05-02 NOTE — Progress Notes (Signed)
PHARMACY - PHYSICIAN COMMUNICATION CRITICAL VALUE ALERT - BLOOD CULTURE IDENTIFICATION (BCID)  [name] is an 86 y.o. female who presented to [facility] on 05/01/2022 with a chief complaint of dyspnea and palpitations.    Assessment:  blood cultures from 8/30 with GPC in 1 of 4 bottles, BCID detects MRSE  Name of physician (or Provider) Contacted: Dr Billie Ruddy  Current antibiotics: cefepime  Changes to prescribed antibiotics recommended:  Blood culture likely contaminant.  To evaluate need for antibiotics Results for orders placed or performed during the hospital encounter of 05/01/22  Blood Culture ID Panel (Reflexed) (Collected: 05/01/2022 10:46 AM)  Result Value Ref Range   Enterococcus faecalis NOT DETECTED NOT DETECTED   Enterococcus Faecium NOT DETECTED NOT DETECTED   Listeria monocytogenes NOT DETECTED NOT DETECTED   Staphylococcus species DETECTED (A) NOT DETECTED   Staphylococcus aureus (BCID) NOT DETECTED NOT DETECTED   Staphylococcus epidermidis DETECTED (A) NOT DETECTED   Staphylococcus lugdunensis NOT DETECTED NOT DETECTED   Streptococcus species NOT DETECTED NOT DETECTED   Streptococcus agalactiae NOT DETECTED NOT DETECTED   Streptococcus pneumoniae NOT DETECTED NOT DETECTED   Streptococcus pyogenes NOT DETECTED NOT DETECTED   A.calcoaceticus-baumannii NOT DETECTED NOT DETECTED   Bacteroides fragilis NOT DETECTED NOT DETECTED   Enterobacterales NOT DETECTED NOT DETECTED   Enterobacter cloacae complex NOT DETECTED NOT DETECTED   Escherichia coli NOT DETECTED NOT DETECTED   Klebsiella aerogenes NOT DETECTED NOT DETECTED   Klebsiella oxytoca NOT DETECTED NOT DETECTED   Klebsiella pneumoniae NOT DETECTED NOT DETECTED   Proteus species NOT DETECTED NOT DETECTED   Salmonella species NOT DETECTED NOT DETECTED   Serratia marcescens NOT DETECTED NOT DETECTED   Haemophilus influenzae NOT DETECTED NOT DETECTED   Neisseria meningitidis NOT DETECTED NOT DETECTED    Pseudomonas aeruginosa NOT DETECTED NOT DETECTED   Stenotrophomonas maltophilia NOT DETECTED NOT DETECTED   Candida albicans NOT DETECTED NOT DETECTED   Candida auris NOT DETECTED NOT DETECTED   Candida glabrata NOT DETECTED NOT DETECTED   Candida krusei NOT DETECTED NOT DETECTED   Candida parapsilosis NOT DETECTED NOT DETECTED   Candida tropicalis NOT DETECTED NOT DETECTED   Cryptococcus neoformans/gattii NOT DETECTED NOT DETECTED   Methicillin resistance mecA/C DETECTED (A) NOT DETECTED    Doreene Eland, PharmD, BCPS, BCIDP Work Cell: [phone] 05/02/2022 8:41 AM

## 2022-05-02 NOTE — Telephone Encounter (Signed)
Pharmacy Patient Advocate Encounter  Insurance verification completed.    The patient is insured through [employer]   The patient is currently admitted and ran test claims for the following: Eliquis .  Copays and coinsurance results were relayed to Inpatient clinical team.

## 2022-05-02 NOTE — TOC Benefit Eligibility Note (Signed)
Patient Teacher, English as a foreign language completed.    The patient is currently admitted and upon discharge could be taking Eliquis 5 mg.  The current 30 day co-pay is $30.29.   The patient is insured through [place], [place] Patient Advocate Specialist [place] Patient Advocate Team Direct Number: [phone]  Fax: [phone]

## 2022-05-02 NOTE — Progress Notes (Signed)
PROGRESS NOTE    [name]  WUJ:[id number] DOB: 10/21/1935 DOA: 05/01/2022 PCP: [name], NP  [id number]  LOS: 1 day   Brief hospital course: No notes on file  Assessment & Plan: [name] is a 86 y.o. female with medical history significant for ILD, recurrent c diff, ulcerative colitis, chronic pain, chronic hypoxic respiratory failure, who presents with dyspnea and palpitations.   Reports a decline of several months with acute worsening the past few days. Reports palpitations with minimal movement present for several weeks. No chest pain. Reports having to increase baseline home o2 of 2-3 liters to 4 liters lately, has had several months of hypoxia, and has worsened over past few weeks to being dyspneic at rest worse with minimal ambulation.   # Acute on chronic hypoxemic respiratory failure --recently, pt has been on 4-5L home O2.  Pt was put on NRB in the ED with Sats of 94%, and then 10L hfnc. --covid and RVP neg.  likely progressive ILD, pulm edema, and small PE contributing.  Questionable PNA.  Started on tx for all 4 etiologies on admission. --Continue supplemental O2 to keep sats between 88-92%, wean as tolerated  # ILD Underlying severe ILD that is likely progressing but at least several days of acutely worsening dyspnea.  --started on prednisone 50 mg daily on admission --pulm consulted, Dr. Lanney Gins Plan: --cont prednisone 50 mg daily, pending pulm rec  # Acute on chronic diastolic CHF --pulm edema seen on CT, elevated BNP --started on IV lasix 40 mg daily Plan: --cont IV lasix 40 mg daily   # Pulmonary embolus, acute Small on CT, doubt it's the main driver for her dyspnea.  Started on Eliquis Plan: --cont Eliquis   # Questionable CAP --started on cefepime on admission Plan: --ID consult today, per pulm rec --cont cefepime pending ID rec  # History recurrent C diff --has multiple BM's per day, sometimes loose, but pt said that's  her baseline --C diff neg on presentation  # Chronic pain - home norco prn   # Palpitations Likely consequence of dyspnea. Here sinus rhythm, no anemia   # UC No apparent flare. On entyvio ever 2 weeks at home   DVT prophylaxis: [id number] Code Status: Full code  Family Communication: husband updated at bedside today Level of care: Progressive Dispo:   The patient is from: home Anticipated d/c is to: home Anticipated d/c date is: 2-3 days   Subjective and Interval History:  Pt reported dyspnea improved on the "hospital oxygen delivery system"; I advised pt that she was on 10L instead of 5L at home.    Pt reported her BM is at baseline.     Objective: Vitals:   05/02/22 0650 05/02/22 0832 05/02/22 1126 05/02/22 1138  BP: (!) 124/51 (!) 114/55  119/78  Pulse: 91 85  89  Resp: (!) 31 14  14   Temp:  97.7 F (36.5 C)  97.6 F (36.4 C)  TempSrc:  Oral  Oral  SpO2: 97% 96% (!) 88% 99%  Weight:      Height:        Intake/Output Summary (Last 24 hours) at 05/02/2022 1601 Last data filed at 05/02/2022 1100 Gross per 24 hour  Intake 11.26 ml  Output 600 ml  Net -588.74 ml   Filed Weights   05/01/22 0753  Weight: 49.4 kg    Examination:   Constitutional: NAD, AAOx3 HEENT: conjunctivae and lids normal, EOMI CV: No cyanosis.   RESP: normal  respiratory effort, faint crackles over bases, on 10L SKIN: warm, dry Neuro: II - XII grossly intact.   Psych: Normal mood and affect.  Appropriate judgement and reason   Data Reviewed: I have personally reviewed labs and imaging studies  Time spent: 50 minutes  Enzo Bi, MD [facility] If 7PM-7AM, please contact night-coverage 05/02/2022, 4:01 PM

## 2022-05-03 DIAGNOSIS — J849 Interstitial pulmonary disease, unspecified: Secondary | ICD-10-CM | POA: Diagnosis not present

## 2022-05-03 DIAGNOSIS — J9601 Acute respiratory failure with hypoxia: Secondary | ICD-10-CM

## 2022-05-03 DIAGNOSIS — J9621 Acute and chronic respiratory failure with hypoxia: Secondary | ICD-10-CM | POA: Diagnosis not present

## 2022-05-03 LAB — LEGIONELLA PNEUMOPHILA SEROGP 1 UR AG: L. pneumophila Serogp 1 Ur Ag: NEGATIVE

## 2022-05-03 LAB — BASIC METABOLIC PANEL
Anion gap: 6 (ref 5–15)
BUN: 19 mg/dL (ref 8–23)
CO2: 26 mmol/L (ref 22–32)
Calcium: 7.4 mg/dL — ABNORMAL LOW (ref 8.9–10.3)
Chloride: 104 mmol/L (ref 98–111)
Creatinine, Ser: 0.71 mg/dL (ref 0.44–1.00)
GFR, Estimated: >60 mL/min (ref >60)
Glucose, Bld: 108 mg/dL — ABNORMAL HIGH (ref 70–99)
Potassium: 6.2 mmol/L — ABNORMAL HIGH (ref 3.5–5.1)
Sodium: 136 mmol/L (ref 135–145)

## 2022-05-03 LAB — PROCALCITONIN: Procalcitonin: <0.1 ng/mL

## 2022-05-03 LAB — POTASSIUM: Potassium: 3.6 mmol/L (ref 3.5–5.1)

## 2022-05-03 LAB — CBC
HCT: 32.5 % — ABNORMAL LOW (ref 36.0–46.0)
Hemoglobin: 10.1 g/dL — ABNORMAL LOW (ref 12.0–15.0)
MCH: 27.3 pg (ref 26.0–34.0)
MCHC: 31.1 g/dL (ref 30.0–36.0)
MCV: 87.8 fL (ref 80.0–100.0)
Platelets: 274 10*3/uL (ref 150–400)
RBC: 3.7 MIL/uL — ABNORMAL LOW (ref 3.87–5.11)
RDW: 15.3 % (ref 11.5–15.5)
WBC: 6.4 10*3/uL (ref 4.0–10.5)
nRBC: 0 % (ref 0.0–0.2)

## 2022-05-03 LAB — MAGNESIUM: Magnesium: 2.4 mg/dL (ref 1.7–2.4)

## 2022-05-03 MED ORDER — SULFAMETHOXAZOLE-TRIMETHOPRIM 400-80 MG PO TABS
1.0000 | ORAL_TABLET | Freq: Every day | ORAL | Status: DC
Start: 2022-05-04 — End: 2022-05-06
  Administered 2022-05-04 – 2022-05-06 (×3): 1 via ORAL
  Filled 2022-05-03 (×3): qty 1

## 2022-05-03 MED ORDER — METOLAZONE 5 MG PO TABS
5.0000 mg | ORAL_TABLET | Freq: Every day | ORAL | Status: DC
  Administered 2022-05-03 – 2022-05-04 (×2): 5 mg via ORAL
  Filled 2022-05-03 (×2): qty 1

## 2022-05-03 MED ORDER — FUROSEMIDE 10 MG/ML IJ SOLN
40.0000 mg | Freq: Two times a day (BID) | INTRAMUSCULAR | Status: DC
  Administered 2022-05-03 – 2022-05-04 (×3): 40 mg via INTRAVENOUS
  Filled 2022-05-03 (×3): qty 4

## 2022-05-03 MED ORDER — METHYLPREDNISOLONE SODIUM SUCC 125 MG IJ SOLR
80.0000 mg | INTRAMUSCULAR | Status: DC
  Administered 2022-05-03: 80 mg via INTRAVENOUS
  Filled 2022-05-03: qty 2

## 2022-05-03 NOTE — Progress Notes (Signed)
PT Cancellation Note  Patient Details Name: NISHITA ISAACKS MRN: [id number] DOB: 04-04-1936   Cancelled Treatment:    Reason Eval/Treat Not Completed: Patient not medically ready Pt noted to have elevated K+ (6.2). Will hold PT tx at this time & f/u as able, as pt not medically appropriate for PT intervention.  Lavone Nian, PT, DPT 05/03/22, 8:48 AM   Waunita Schooner 05/03/2022, 8:47 AM

## 2022-05-03 NOTE — Progress Notes (Signed)
PROGRESS NOTE    [name]  [id number]:[id number] DOB: Nov 28, 1935 DOA: 05/01/2022 PCP: [name], NP  [id number]  LOS: 2 days   Brief hospital course: No notes on file  Assessment & Plan: [name] is a 86 y.o. female with medical history significant for ILD, recurrent c diff, ulcerative colitis, chronic pain, chronic hypoxic respiratory failure, who presents with dyspnea and palpitations.   Reports a decline of several months with acute worsening the past few days. Reports palpitations with minimal movement present for several weeks. No chest pain. Reports having to increase baseline home o2 of 2-3 liters to 4 liters lately, has had several months of hypoxia, and has worsened over past few weeks to being dyspneic at rest worse with minimal ambulation.   # Acute on chronic hypoxemic respiratory failure --recently, pt has been on 4-5L home O2.  Pt was put on NRB in the ED with Sats of 94%, and then 10L hfnc. --covid and RVP neg.  likely progressive ILD, pulm edema, and small PE contributing.  Questionable PNA.  Started on tx for all 4 etiologies on admission. --Continue supplemental O2 to keep sats between 88-92%, wean as tolerated  # ILD Underlying severe ILD that is likely progressing but at least several days of acutely worsening dyspnea.  --started on prednisone 50 mg daily on admission --pulm consulted, Dr. Lanney Gins Plan: --increase steroid to IV solumedrol 80 mg daily due to increased O2 requirement  # Acute on chronic diastolic CHF --pulm edema seen on CT, elevated BNP --started on IV lasix 40 mg daily Plan: --increase IV lasix 40 mg to BID   # Pulmonary embolus, acute Small on CT, doubt it's the main driver for her dyspnea.  Started on Eliquis Plan: --cont Eliquis   # CAP, ruled out --started on cefepime on admission --ID consulted, and ruled out PNA, abx d/c'ed  # History recurrent C diff --has multiple BM's per day, sometimes loose, but pt  said that's her baseline --C diff neg on presentation  # Chronic pain - home norco prn   # Palpitations Likely consequence of dyspnea. Here sinus rhythm, no anemia   # UC No apparent flare. On entyvio ever 2 weeks at home   DVT prophylaxis: [id number] Code Status: Full code  Family Communication:  Level of care: Progressive Dispo:   The patient is from: home Anticipated d/c is to: home Anticipated d/c date is: > 3 days   Subjective and Interval History:  Pt reported not feeling as well today, more short of breath.  O2 requirement went up to 11L.     Objective: Vitals:   05/03/22 0009 05/03/22 0015 05/03/22 0352 05/03/22 0734  BP: 110/61  111/78 109/65  Pulse: 73  73 61  Resp: 20  18 20   Temp: (!) 97.4 F (36.3 C)  (!) 97.4 F (36.3 C) 98.5 F (36.9 C)  TempSrc:      SpO2: 95% 94% (!) 89% 91%  Weight:      Height:        Intake/Output Summary (Last 24 hours) at 05/03/2022 1604 Last data filed at 05/03/2022 1011 Gross per 24 hour  Intake 720 ml  Output --  Net 720 ml   Filed Weights   05/01/22 0753  Weight: 49.4 kg    Examination:   Constitutional: NAD, AAOx3 HEENT: conjunctivae and lids normal, EOMI CV: No cyanosis.   RESP: normal respiratory effort, on 11L Extremities: No effusions, edema in BLE SKIN: warm, dry Neuro:  II - XII grossly intact.   Psych: Normal mood and affect.  Appropriate judgement and reason   Data Reviewed: I have personally reviewed labs and imaging studies  Time spent: 35 minutes  Enzo Bi, MD [facility] If 7PM-7AM, please contact night-coverage 05/03/2022, 4:04 PM

## 2022-05-03 NOTE — Progress Notes (Signed)
Physical Therapy Treatment Patient Details Name: [name] MRN: [id number] DOB: June 03, 1936 Today's Date: 05/03/2022   History of Present Illness Admitted due to dyspnea and palpitations. Reports a decline of several months with acute worsening the past few days. Reports palpitations with minimal movement present for several weeks. No chest pain. Reports having to increase baseline home o2 of 2-3 liters to 4 liters lately, has had several months of hypoxia, and has worsened over past few weeks to being dyspneic at rest worse with minimal ambulation. Slight increased cough, not productive. Imaging revals PE. Per Dr. Billie Ruddy it is small and she is ok to participate with PT.    PT Comments    Pt seen for PT tx with pt agreeable. Pt on 12L/min via nasal cannula & instructed on pursed lip breathing throughout session. Pt is able to complete supine>sit with supervision with hospital bed features & stand pivot bed>recliner with CGA. Pt stands additional time with CGA to allow PT to place chair alarm pad. Pt is limited by SpO2 that drops as low as 65% & nurse called to room. Pt able to recover to >/= 90% but requires extra time. Will continue to follow pt acutely to address endurance, gait, and balance.    Recommendations for follow up therapy are one component of a multi-disciplinary discharge planning process, led by the attending physician.  Recommendations may be updated based on patient status, additional functional criteria and insurance authorization.  Follow Up Recommendations  Home health PT     Assistance Recommended at Discharge Frequent or constant Supervision/Assistance  Patient can return home with the following A little help with walking and/or transfers;A little help with bathing/dressing/bathroom;Assist for transportation;Assistance with cooking/housework   Equipment Recommendations  None recommended by PT    Recommendations for Other Services       Precautions / Restrictions  Precautions Precautions: Fall Restrictions Weight Bearing Restrictions: No     Mobility  Bed Mobility Overal bed mobility: Needs Assistance Bed Mobility: Supine to Sit     Supine to sit: Supervision, HOB elevated     General bed mobility comments: use of bed rails PRN    Transfers Overall transfer level: Needs assistance Equipment used: None Transfers: Sit to/from Stand, Bed to chair/wheelchair/BSC Sit to Stand: Supervision Stand pivot transfers: Min guard              Ambulation/Gait                   Stairs             Wheelchair Mobility    Modified Rankin (Stroke Patients Only)       Balance Overall balance assessment: Needs assistance Sitting-balance support: Feet supported Sitting balance-Leahy Scale: Good Sitting balance - Comments: supervision static sitting   Standing balance support: During functional activity, No upper extremity supported Standing balance-Leahy Scale: Fair Standing balance comment: close supervision static standing EOB                            Cognition Arousal/Alertness: Awake/alert Behavior During Therapy: WFL for tasks assessed/performed Overall Cognitive Status: Within Functional Limits for tasks assessed                                          Exercises      General Comments  Pertinent Vitals/Pain Pain Assessment Pain Assessment: No/denies pain    Home Living                          Prior Function            PT Goals (current goals can now be found in the care plan section) Acute Rehab PT Goals Patient Stated Goal: to return home PT Goal Formulation: With patient Time For Goal Achievement: 05/17/22 Potential to Achieve Goals: Good Progress towards PT goals: Progressing toward goals    Frequency    Min 2X/week      PT Plan Current plan remains appropriate    Co-evaluation              AM-PAC PT "6 Clicks" Mobility    Outcome Measure  Help needed turning from your back to your side while in a flat bed without using bedrails?: None Help needed moving from lying on your back to sitting on the side of a flat bed without using bedrails?: A Little Help needed moving to and from a bed to a chair (including a wheelchair)?: A Little Help needed standing up from a chair using your arms (e.g., wheelchair or bedside chair)?: A Little Help needed to walk in hospital room?: A Little Help needed climbing 3-5 steps with a railing? : A Lot 6 Click Score: 18    End of Session Equipment Utilized During Treatment: Oxygen Activity Tolerance: Patient tolerated treatment well Patient left: in chair;with chair alarm set;with call bell/phone within reach Nurse Communication:  (O2) PT Visit Diagnosis: Other abnormalities of gait and mobility (R26.89);Muscle weakness (generalized) (M62.81)     Time: 3646-8032 PT Time Calculation (min) (ACUTE ONLY): 14 min  Charges:  $Therapeutic Activity: 8-22 mins                     Lavone Nian, PT, DPT 05/03/22, 3:49 PM    Waunita Schooner 05/03/2022, 3:48 PM

## 2022-05-03 NOTE — Progress Notes (Signed)
PULMONOLOGY         Date: 05/03/2022,   MRN# [id number] [name] 22-Dec-1935     AdmissionWeight: 49.4 kg                 CurrentWeight: 49.4 kg  Referring provider: [name]   CHIEF COMPLAINT:   Acute on chronic hypoxemic respiratory failure   HISTORY OF PRESENT ILLNESS   This is a pleasant 85 yo F with hx of UC, ILD with Pulmonary fibrosis, chronic pain syndrome, who came in due to acute on chronic hypoxemia. She reports increased O2 requirement at home with worsening cough, she denies flu like illness or fevers.  Labwork reveals elevated BNP   05/02/22 - patient is improved clinically, RVP is negative , does not appear to have ongoing infection s/p ID eval with de-escalation of antimicrobials. Placed on eliquis for acute PE with mild clot burden.  CRP and ESR was not collected and I have reordered this. She is nontachypneic during exam speaking in full sentences. Mild hypokalemia on labs has been repleted and repleted magnesium as well.    PAST MEDICAL HISTORY   Past Medical History:  Diagnosis Date   Altered mental state    Anginal pain (HCC)    Anxiety    Basal cell carcinoma 05/03/2021   right neck postauricular - Excised 06/12/21   BMI 30.0-30.9,adult    Cellulitis    Chest pain, atypical    Compression fracture of L4 lumbar vertebra    Constipation    COVID-19 virus infection 05/28/2020   Cystitis    Cystocele    Diverticulosis    Fatigue    Fibrosis, idiopathic pulmonary (HCC)    Gastroenteritis    History of back surgery    History of herniated intervertebral disc    HTN (hypertension)    Hypercholesteremia    Hypocalcemia    Incontinence of urine    OA (osteoarthritis)    shoulder and back   Obesity    Osteopenia    Pneumonia    Shortness of breath    Sleep apnea    Squamous cell carcinoma of skin 07/16/2016   Right upper lateral eyebrow. SCCis.   Squamous cell carcinoma of skin 01/13/2018   Left temporal hairline. WD SCC with  superficial infiltration.   Thrush    Vitamin D deficiency      SURGICAL HISTORY   Past Surgical History:  Procedure Laterality Date   BACK SURGERY     CATARACT EXTRACTION W/PHACO Left 04/24/2016   Procedure: CATARACT EXTRACTION PHACO AND INTRAOCULAR LENS PLACEMENT (IOC);  Surgeon: Lockie Mola, MD;  Location: [facility];  Service: Ophthalmology;  Laterality: Left;   CATARACT EXTRACTION W/PHACO Right 07/14/2017   Procedure: CATARACT EXTRACTION PHACO AND INTRAOCULAR LENS PLACEMENT (IOC)  RIGHT;  Surgeon: Lockie Mola, MD;  Location: [facility];  Service: Ophthalmology;  Laterality: Right;   CHOLECYSTECTOMY     COLONOSCOPY WITH PROPOFOL N/A 02/01/2022   Procedure: COLONOSCOPY WITH PROPOFOL;  Surgeon: Wyline Mood, MD;  Location: [facility] END[facility]COPY;  Service: Gastroenterology;  Laterality: N/A;   CYSTOSCOPY/URETEROSCOPY/HOLMIUM LASER/STENT PLACEMENT Left 04/27/2021   Procedure: CYSTOSCOPY/URETEROSCOPY/HOLMIUM LASER/STENT PLACEMENT;  Surgeon: Sondra Come, MD;  Location: [facility] ORS;  Service: Urology;  Laterality: Left;   ESOPHAGOGASTRODUODENOSCOPY (EGD) WITH PROPOFOL N/A 10/13/2018   Procedure: ESOPHAGOGASTRODUODENOSCOPY (EGD) WITH PROPOFOL;  Surgeon: Wyline Mood, MD;  Location: [facility];  Service: Gastroenterology;  Laterality: N/A;   ESOPHAGOGASTRODUODENOSCOPY (EGD) WITH PROPOFOL N/A 11/22/2021  Procedure: ESOPHAGOGASTRODUODENOSCOPY (EGD) WITH PROPOFOL;  Surgeon: Wyline Mood, MD;  Location: [facility] ENDOSCOPY;  Service: Gastroenterology;  Laterality: N/A;   EYE SURGERY     FLEXIBLE SIGMOIDOSCOPY N/A 10/13/2018   Procedure: FLEXIBLE SIGMOIDOSCOPY;  Surgeon: Wyline Mood, MD;  Location: [facility] ENDOSCOPY;  Service: Gastroenterology;  Laterality: N/A;   FLEXIBLE SIGMOIDOSCOPY N/A 09/09/2019   Procedure: FLEXIBLE SIGMOIDOSCOPY;  Surgeon: Wyline Mood, MD;  Location: [facility] ENDOSCOPY;  Service: Gastroenterology;  Laterality: N/A;   FLEXIBLE SIGMOIDOSCOPY N/A 08/04/2020    Procedure: FLEXIBLE SIGMOIDOSCOPY;  Surgeon: Wyline Mood, MD;  Location: [facility] ENDOSCOPY;  Service: Gastroenterology;  Laterality: N/A;  Per Dr. Tobi Bastos, unsedated   FLEXIBLE SIGMOIDOSCOPY N/A 08/31/2021   Procedure: FLEXIBLE SIGMOIDOSCOPY;  Surgeon: Wyline Mood, MD;  Location: [facility] ENDOSCOPY;  Service: Gastroenterology;  Laterality: N/A;  no sedation   HIP ARTHROPLASTY Right 09/27/2020   Procedure: ARTHROPLASTY BIPOLAR HIP (HEMIARTHROPLASTY);  Surgeon: Kennedy Bucker, MD;  Location: [facility] ORS;  Service: Orthopedics;  Laterality: Right;   JOINT REPLACEMENT     ROTATOR CUFF REPAIR Bilateral    left-12/2009; right-03/2009 dr.califf   TONSILLECTOMY     TOTAL SHOULDER REPLACEMENT Left      FAMILY HISTORY   Family History  Problem Relation Age of Onset   COPD Mother    Cancer Brother        colon     SOCIAL HISTORY   Social History   Tobacco Use   Smoking status: Never   Smokeless tobacco: Never  Vaping Use   Vaping Use: Never used  Substance Use Topics   Alcohol use: No   Drug use: No     MEDICATIONS    Home Medication:     Current Medication:  Current Facility-Administered Medications:    0.9 %  sodium chloride infusion, 250 mL, Intravenous, PRN, Wouk, Wilfred Curtis, MD   apixaban (ELIQUIS) tablet 10 mg, 10 mg, Oral, BID, 10 mg at 05/02/22 2216 **FOLLOWED BY** [START ON 05/08/2022] apixaban (ELIQUIS) tablet 5 mg, 5 mg, Oral, BID, Tressie Ellis, RPH   Chlorhexidine Gluconate Cloth 2 % PADS 6 each, 6 each, Topical, Daily, Darlin Priestly, MD, 6 each at 05/02/22 0953   citalopram (CELEXA) tablet 40 mg, 40 mg, Oral, Daily, Wouk, Wilfred Curtis, MD, 40 mg at 05/02/22 0902   feeding supplement (ENSURE ENLIVE / ENSURE PLUS) liquid 237 mL, 237 mL, Oral, BID BM, Darlin Priestly, MD, 237 mL at 05/02/22 1500   HYDROcodone-acetaminophen (NORCO/VICODIN) 5-325 MG per tablet 1 tablet, 1 tablet, Oral, TID PRN, Kathrynn Running, MD, 1 tablet at 05/02/22 2216   loperamide (IMODIUM) capsule 2 mg, 2 mg,  Oral, BID PRN, Darlin Priestly, MD, 2 mg at 05/03/22 0300   pantoprazole (PROTONIX) EC tablet 40 mg, 40 mg, Oral, Daily, Wouk, Wilfred Curtis, MD, 40 mg at 05/02/22 9604   predniSONE (DELTASONE) tablet 40 mg, 40 mg, Oral, Q breakfast, Gennell How, MD   sodium chloride flush (NS) 0.9 % injection 3 mL, 3 mL, Intravenous, Q12H, Wouk, Wilfred Curtis, MD, 3 mL at 05/02/22 2200   sodium chloride flush (NS) 0.9 % injection 3 mL, 3 mL, Intravenous, PRN, Wouk, Wilfred Curtis, MD    ALLERGIES   Patient has no known allergies.     REVIEW OF SYSTEMS    Review of Systems:  Gen:  Denies  fever, sweats, chills weigh loss  HEENT: Denies blurred vision, double vision, ear pain, eye pain, hearing loss, nose bleeds, sore throat Cardiac:  No dizziness, chest pain or heaviness, chest tightness,edema  Resp:   reports dyspnea chronically  Gi: Denies swallowing difficulty, stomach pain, nausea or vomiting, diarrhea, constipation, bowel incontinence Gu:  Denies bladder incontinence, burning urine Ext:   Denies Joint pain, stiffness or swelling Skin: Denies  skin rash, easy bruising or bleeding or hives Endoc:  Denies polyuria, polydipsia , polyphagia or weight change Psych:   Denies depression, insomnia or hallucinations   Other:  All other systems negative   VS: BP 111/78 (BP Location: Left Arm)   Pulse 73   Temp (!) 97.4 F (36.3 C)   Resp 18   Ht 4\' 10"  (1.473 m)   Wt 49.4 kg   SpO2 (!) 89%   BMI 22.78 kg/m      PHYSICAL EXAM    GENERAL:NAD, no fevers, chills, no weakness no fatigue HEAD: Normocephalic, atraumatic.  EYES: Pupils equal, round, reactive to light. Extraocular muscles intact. No scleral icterus.  MOUTH: Moist mucosal membrane. Dentition intact. No abscess noted.  EAR, NOSE, THROAT: Clear without exudates. No external lesions.  NECK: Supple. No thyromegaly. No nodules. No JVD.  PULMONARY: decreased breath sounds with mild rhonchi worse at bases bilaterally.  CARDIOVASCULAR: S1  and S2. Regular rate and rhythm. No murmurs, rubs, or gallops. No edema. Pedal pulses 2+ bilaterally.  GASTROINTESTINAL: Soft, nontender, nondistended. No masses. Positive bowel sounds. No hepatosplenomegaly.  MUSCULOSKELETAL: No swelling, clubbing, or edema. Range of motion full in all extremities.  NEUROLOGIC: Cranial nerves II through XII are intact. No gross focal neurological deficits. Sensation intact. Reflexes intact.  SKIN: No ulceration, lesions, rashes, or cyanosis. Skin warm and dry. Turgor intact.  PSYCHIATRIC: Mood, affect within normal limits. The patient is awake, alert and oriented x 3. Insight, judgment intact.       IMAGING     ASSESSMENT/PLAN   Acute on chronic hypoxemic respiratory failure    - abnormal CT chest with interval development of inflammatory changes as evidenced with serial CT chest above   - possible overlying interstitial edema   - RVP has been ordered already not yet collected   - ESR/CRP to check on iflammatory changes  -currently on prednisone 50mg    - the situation is very difficult because she benefits from steroids with ulcerative colitis and ILD but it potentiates infection including Cdiff so therapy is further complicated   - plan to diruese gently as while treating  - her abdominal scan shows ongoing colitis and she may benefit from ID evaluation since this has been going of for months now.  She is at risk for GI bleed with steroids and ongoing colitis and at risk for toxic megacolon.    Pulmonary fibrosis - chronic   - on esbriet - please continue as current  -PT /OT as able             Thank you for allowing me to participate in the care of this patient.   Patient/Family are satisfied with care plan and all questions have been answered.    Provider disclosure: Patient with at least one acute or chronic illness or injury that poses a threat to life or bodily function and is being managed actively during this encounter.  All of  the below services have been performed independently by signing provider:  review of prior documentation from internal and or external health records.  Review of previous and current lab results.  Interview and comprehensive assessment during patient visit today. Review of current and previous chest radiographs/CT scans. Discussion of management and test interpretation with  health care team and patient/family.   This document was prepared using Dragon voice recognition software and may include unintentional dictation errors.     Vida Rigger, M.D.  Division of Pulmonary & Critical Care Medicine

## 2022-05-03 NOTE — Progress Notes (Signed)
Date of Admission:  05/01/2022    ID: [name] is a 86 y.o. female  Principal Problem:   CAP (community acquired pneumonia) Active Problems:   HTN (hypertension)   Idiopathic pulmonary fibrosis (HCC)   Chronic pain syndrome   Ulcerative colitis ([place])   Acute pulmonary embolism (HCC)    Subjective: Pt doing better today Less sob Still on 10L Worked with PT and sat in chair  Medications:   apixaban  10 mg Oral BID   Followed by   Derrill Memo ON 05/08/2022] apixaban  5 mg Oral BID   Chlorhexidine Gluconate Cloth  6 each Topical Daily   citalopram  40 mg Oral Daily   feeding supplement  237 mL Oral BID BM   furosemide  40 mg Intravenous BID   pantoprazole  40 mg Oral Daily   predniSONE  40 mg Oral Q breakfast   sodium chloride flush  3 mL Intravenous Q12H    Objective: Vital signs in last 24 hours: Temp:  [97.4 F (36.3 C)-98.5 F (36.9 C)] 98.5 F (36.9 C) (09/01 0734) Pulse Rate:  [61-89] 61 (09/01 0734) Resp:  [14-20] 20 (09/01 0734) BP: (99-119)/(61-78) 109/65 (09/01 0734) SpO2:  [88 %-99 %] 91 % (09/01 0734)    PHYSICAL EXAM:  General: Alert, cooperative, no distress,  Lungs: b/l air entry basal coarse crepts Heart: Regular rate and rhythm, no murmur, rub or gallop. Abdomen: Soft, non-tender,not distended. Bowel sounds normal. No masses Extremities: atraumatic, no cyanosis. No edema. No clubbing Skin:  bruising Lymph: Cervical, supraclavicular normal. Neurologic: Grossly non-focal  Lab Results Recent Labs    05/02/22 1155 05/03/22 0542 05/03/22 0804  WBC 10.8* 6.4  --   HGB 12.5 10.1*  --   HCT 39.4 32.5*  --   NA 139 136  --   K 2.9* 6.2* 3.6  CL 99 104  --   CO2 27 26  --   BUN 15 19  --   CREATININE 0.76 0.71  --    Liver Panel Recent Labs    05/01/22 0759  PROT 6.4*  ALBUMIN 2.3*  AST 30  ALT 7  ALKPHOS 94  BILITOT 0.7   Sedimentation Rate Recent Labs    05/02/22 1817  ESRSEDRATE 36*   C-Reactive Protein Recent Labs     05/02/22 1817  CRP 25.9*    Microbiology:  Studies/Results: ECHOCARDIOGRAM COMPLETE  Result Date: 05/01/2022    ECHOCARDIOGRAM REPORT   Patient Name:   [name] Date of Exam: 05/01/2022 Medical Rec #:  [id number]         Height:       58.0 in Accession #:    [id number]        Weight:       109.0 lb Date of Birth:  July 13, 1936         BSA:          1.407 m Patient Age:    46 years          BP:           147/87 mmHg Patient Gender: F                 HR:           96 bpm. Exam Location:  [facility] Procedure: 2D Echo, Cardiac Doppler and Color Doppler Indications:     R06.00 Dyspnea  History:         Patient has prior history of Echocardiogram examinations,  most                  recent 05/29/2020. Signs/Symptoms:Shortness of Breath, Chest                  Pain and Fatigue; Risk Factors:Hypertension, Dyslipidemia and                  Sleep Apnea. COVID-19.  Sonographer:     Cresenciano Lick RDCS Referring Phys:  [place] ZOXW Diagnosing Phys: Yolonda Kida MD IMPRESSIONS  1. Left ventricular ejection fraction, by estimation, is 55 to 60%. The left ventricle has normal function. The left ventricle has no regional wall motion abnormalities. Left ventricular diastolic parameters are consistent with Grade I diastolic dysfunction (impaired relaxation).  2. Right ventricular systolic function is normal. The right ventricular size is normal.  3. Left atrial size was mildly dilated.  4. The mitral valve is grossly normal. Mild mitral valve regurgitation.  5. The aortic valve is grossly normal. Aortic valve regurgitation is mild. FINDINGS  Left Ventricle: Left ventricular ejection fraction, by estimation, is 55 to 60%. The left ventricle has normal function. The left ventricle has no regional wall motion abnormalities. The left ventricular internal cavity size was normal in size. There is  borderline left ventricular hypertrophy. Left ventricular diastolic parameters are consistent with Grade I  diastolic dysfunction (impaired relaxation). Right Ventricle: The right ventricular size is normal. No increase in right ventricular wall thickness. Right ventricular systolic function is normal. Left Atrium: Left atrial size was mildly dilated. Right Atrium: Right atrial size was normal in size. Pericardium: There is no evidence of pericardial effusion. Mitral Valve: The mitral valve is grossly normal. Mild mitral valve regurgitation. Tricuspid Valve: The tricuspid valve is normal in structure. Tricuspid valve regurgitation is mild. Aortic Valve: The aortic valve is grossly normal. Aortic valve regurgitation is mild. Aortic valve mean gradient measures 4.6 mmHg. Aortic valve peak gradient measures 8.3 mmHg. Pulmonic Valve: The pulmonic valve was normal in structure. Pulmonic valve regurgitation is not visualized. Aorta: The ascending aorta was not well visualized. IAS/Shunts: No atrial level shunt detected by color flow Doppler.  LEFT VENTRICLE PLAX 2D LVIDd:         2.90 cm Diastology LVIDs:         2.00 cm LV e' medial:    7.18 cm/s LV PW:         0.80 cm LV E/e' medial:  7.4 LV IVS:        0.80 cm LV e' lateral:   5.87 cm/s                        LV E/e' lateral: 9.0  RIGHT VENTRICLE RV Basal diam:  3.40 cm RV S prime:     7.69 cm/s TAPSE (M-mode): 0.7 cm LEFT ATRIUM             Index        RIGHT ATRIUM          Index LA diam:        3.60 cm 2.56 cm/m   RA Area:     5.07 cm LA Vol (A2C):   36.1 ml 25.66 ml/m  RA Volume:   8.09 ml  5.75 ml/m LA Vol (A4C):   25.4 ml 18.05 ml/m LA Biplane Vol: 33.0 ml 23.45 ml/m  AORTIC VALVE AV Vmax:           143.80 cm/s AV  Vmean:          103.560 cm/s AV VTI:            0.232 m AV Peak Grad:      8.3 mmHg AV Mean Grad:      4.6 mmHg LVOT Vmax:         84.25 cm/s LVOT Vmean:        59.000 cm/s LVOT VTI:          0.122 m LVOT/AV VTI ratio: 0.52  AORTA Ao Root diam: 2.50 cm Ao Asc diam:  3.20 cm MITRAL VALVE               TRICUSPID VALVE MV Area (PHT): 3.65 cm    TR Peak  grad:   37.5 mmHg MV Decel Time: 208 msec    TR Vmax:        306.00 cm/s MV E velocity: 52.90 cm/s MV A velocity: 87.30 cm/s  SHUNTS MV E/A ratio:  0.61        Systemic VTI: 0.12 m Dwayne D Callwood MD Electronically signed by Yolonda Kida MD Signature Date/Time: 05/01/2022/10:46:11 PM    Final    US Venous Img Lower Bilateral (DVT)  Result Date: 05/01/2022 CLINICAL DATA:  PE EXAM: Bilateral LOWER EXTREMITY VENOUS DOPPLER ULTRASOUND TECHNIQUE: Gray-scale sonography with compression, as well as color and duplex ultrasound, were performed to evaluate the deep venous system(s) from the level of the common femoral vein through the popliteal and proximal calf veins. COMPARISON:  Chest CT 05/01/2022 FINDINGS: VENOUS Normal compressibility of the common femoral, superficial femoral, and popliteal veins, as well as the visualized calf veins. Visualized portions of profunda femoral vein and great saphenous vein unremarkable. No filling defects to suggest DVT on grayscale or color Doppler imaging. Doppler waveforms show normal direction of venous flow, normal respiratory plasticity and response to augmentation. OTHER None. Limitations: none IMPRESSION: Negative. Electronically Signed   By: Donavan Foil M.D.   On: 05/01/2022 20:51   DG Chest Portable 1 View  Result Date: 05/01/2022 CLINICAL DATA:  Shortness of breath. EXAM: PORTABLE CHEST 1 VIEW COMPARISON:  May 01, 2022 FINDINGS: The heart size and mediastinal contours are within normal limits. Low lung volumes are noted. Marked severity diffuse bilateral interstitial opacities are again seen. Underlying bibasilar pulmonary fibrotic changes are again noted. There is stable elevation of the left hemidiaphragm. There is no evidence of a pleural effusion or pneumothorax. A left shoulder prosthesis is seen. IMPRESSION: 1. Marked severity diffuse bilateral interstitial opacities which are, in part, chronic in nature. A superimposed infectious component cannot be  excluded. Electronically Signed   By: Virgina Norfolk M.D.   On: 05/01/2022 20:07   CT Angio Chest Pulmonary Embolism (PE) W or WO Contrast  Result Date: 05/01/2022 CLINICAL DATA:  Insert EXAM: CT ANGIOGRAPHY CHEST WITH CONTRAST TECHNIQUE: Multidetector CT imaging of the chest was performed using the standard protocol during bolus administration of intravenous contrast. Multiplanar CT image reconstructions and MIPs were obtained to evaluate the vascular anatomy. RADIATION DOSE REDUCTION: This exam was performed according to the departmental dose-optimization program which includes automated exposure control, adjustment of the mA and/or kV according to patient size and/or use of iterative reconstruction technique. CONTRAST:  25m OMNIPAQUE IOHEXOL 350 MG/ML SOLN COMPARISON:  CT 10/19/2021 FINDINGS: Cardiovascular: Filling defect within the proximal LEFT lingular pulmonary artery (image 30/4). This defect is also seen on coronal image 30/7. This defect is partially occlusive. Potential additional second proximal LEFT lobe  pulmonary artery filling defect on image 33/4. This is also partially occlusive. Mediastinum/Nodes: Enlarged RIGHT lower paratracheal node measuring 14 mm (image 22/4) is new from prior. Lungs/Pleura: There is severe ground-glass densities throughout the lungs superimposed on chronic interstitial lung disease. No pneumothorax. No pleural fluid. No infarction or pneumonia. Upper Abdomen: Limited view of the liver, kidneys, pancreas are unremarkable. Normal adrenal glands. Musculoskeletal: No aggressive osseous lesion. Review of the MIP images confirms the above findings. IMPRESSION: 1. Acute pulmonary emboli within the proximal lingular pulmonary artery and LEFT lobe pulmonary artery. These acute pulmonary emboli are partially occlusive. Overall clot burden is mild. 2. Extensive diffuse ground-glass densities superimposed on interstitial lung disease. Favor pulmonary edema pattern versus  atypical pneumonia. 3. Enlarged RIGHT lower paratracheal node is favored reactive. Critical Value/emergent results were called by telephone at the time of interpretation on 05/01/2022 at 5:02 pm to provider [facility] , who verbally acknowledged these results. Electronically Signed   By: Suzy Bouchard M.D.   On: 05/01/2022 17:03   CT ABDOMEN PELVIS W CONTRAST  Result Date: 05/01/2022 CLINICAL DATA:  Shortness of breath. Sharp left lower quadrant pain. History of pulmonary fibrosis. EXAM: CT ABDOMEN AND PELVIS WITH CONTRAST TECHNIQUE: Multidetector CT imaging of the abdomen and pelvis was performed using the standard protocol following bolus administration of intravenous contrast. RADIATION DOSE REDUCTION: This exam was performed according to the departmental dose-optimization program which includes automated exposure control, adjustment of the mA and/or kV according to patient size and/or use of iterative reconstruction technique. CONTRAST:  63m OMNIPAQUE IOHEXOL 300 MG/ML  SOLN COMPARISON:  01/17/2022 CT.  02/14/2022 MRI. FINDINGS: Lower chest: Pulmonary fibrotic pattern with bronchiectasis. Pulmonary parenchymal opacity appears worsened compared to prior study and there could be a degree of edema or infectious pneumonia. Hepatobiliary: Liver parenchyma is normal. Previous cholecystectomy. Pancreas: Normal Spleen: Normal Adrenals/Urinary Tract: Adrenal glands are normal. Kidneys are normal except for an 8 mm stone in the upper pole the right kidney without obstruction. Bladder is normal. Stomach/Bowel: Stomach and small intestine are normal. Redemonstration of a diffuse colitis pattern, particularly in the left colon, as was seen at previous imaging. The patient has diverticulosis but there is no convincing diverticulitis. Low level diverticulitis can be inapparent at imaging. Vascular/Lymphatic: Aortic atherosclerosis. No aneurysm. IVC is normal. No adenopathy. Reproductive: No pelvic mass. Other: No free  fluid or air. Musculoskeletal: Chronic degenerative change and curvature of the spine. Old augmented fractures at T12, L1 and L4. Old healed superior endplate fracture at L3. IMPRESSION: Redemonstration of diffuse colitis pattern, particularly affecting the left colon, similar to the prior imaging of May and June of this year. Infectious or inflammatory colitis is favored. The patient does have diverticulosis but I do not see evidence of acute diverticulitis. Low level diverticulitis can be inapparent at imaging. Findings of chronic pulmonary fibrosis. Increased pulmonary parenchymal density which could be seen in the setting of edema or infectious inflammation. Nonobstructing 8 mm stone at the upper pole of the right kidney. Electronically Signed   By: MNelson ChimesM.D.   On: 05/01/2022 11:46     Assessment/Plan: Acute on chronic hypoxic resp failure in a patient with underlying interstitial lung disease COVID. Resp viral panel neg? Small PE No obvious consolidation But has b/l GGO on CT- ? Element of fluid overload with underlying fibrosis Normal procal Cefepime discontinued yesterday. She has had recurrent Cdiff and has undergone FMT So the risk of antibiotic outweighs benefit currently  On prednisone, dose has  been increased to 92mSo add bactrim SS 1 tablet once a day Will send fungitell, fungal antibodies as she has bee on entyvio   Acute on chronic diastolic CHF Small PE on eliquis   H/o recurrent cdiff needing FMT- avoid antibiotics as much as possible If needed then would do empiric vanco PO   Ulcerative colitis on entyvio   ? Discussed the management with patient and care team- ID will follow her peripherally this weekend [facility] on call the next three days and available by phone for urgent issues

## 2022-05-04 ENCOUNTER — Inpatient Hospital Stay: Payer: Medicare HMO

## 2022-05-04 DIAGNOSIS — J9621 Acute and chronic respiratory failure with hypoxia: Secondary | ICD-10-CM | POA: Diagnosis not present

## 2022-05-04 LAB — BASIC METABOLIC PANEL
Anion gap: 13 (ref 5–15)
BUN: 30 mg/dL — ABNORMAL HIGH (ref 8–23)
CO2: 33 mmol/L — ABNORMAL HIGH (ref 22–32)
Calcium: 8.8 mg/dL — ABNORMAL LOW (ref 8.9–10.3)
Chloride: 94 mmol/L — ABNORMAL LOW (ref 98–111)
Creatinine, Ser: 0.82 mg/dL (ref 0.44–1.00)
GFR, Estimated: >60 mL/min (ref >60)
Glucose, Bld: 117 mg/dL — ABNORMAL HIGH (ref 70–99)
Potassium: 2.9 mmol/L — ABNORMAL LOW (ref 3.5–5.1)
Sodium: 140 mmol/L (ref 135–145)

## 2022-05-04 LAB — CULTURE, BLOOD (ROUTINE X 2): Specimen Description: ADEQUATE

## 2022-05-04 LAB — C-REACTIVE PROTEIN: CRP: 9.8 mg/dL — ABNORMAL HIGH (ref <1.0)

## 2022-05-04 LAB — CBC
HCT: 39.8 % (ref 36.0–46.0)
Hemoglobin: 12.6 g/dL (ref 12.0–15.0)
MCH: 27.7 pg (ref 26.0–34.0)
MCHC: 31.7 g/dL (ref 30.0–36.0)
MCV: 87.5 fL (ref 80.0–100.0)
Platelets: 395 10*3/uL (ref 150–400)
RBC: 4.55 MIL/uL (ref 3.87–5.11)
RDW: 15.1 % (ref 11.5–15.5)
WBC: 7.9 10*3/uL (ref 4.0–10.5)
nRBC: 0 % (ref 0.0–0.2)

## 2022-05-04 LAB — MAGNESIUM: Magnesium: 2.4 mg/dL (ref 1.7–2.4)

## 2022-05-04 MED ORDER — HYDROCOD POLI-CHLORPHE POLI ER 10-8 MG/5ML PO SUER
2.5000 mL | Freq: Three times a day (TID) | ORAL | Status: AC
  Administered 2022-05-04 – 2022-05-06 (×9): 2.5 mL via ORAL
  Filled 2022-05-04 (×9): qty 5

## 2022-05-04 MED ORDER — METHYLPREDNISOLONE SODIUM SUCC 125 MG IJ SOLR
60.0000 mg | INTRAMUSCULAR | Status: DC
  Administered 2022-05-04 – 2022-05-06 (×3): 60 mg via INTRAVENOUS
  Filled 2022-05-04 (×3): qty 2

## 2022-05-04 MED ORDER — POTASSIUM CHLORIDE CRYS ER 20 MEQ PO TBCR
40.0000 meq | EXTENDED_RELEASE_TABLET | ORAL | Status: AC
  Administered 2022-05-04 (×2): 40 meq via ORAL
  Filled 2022-05-04 (×2): qty 2

## 2022-05-04 NOTE — Progress Notes (Signed)
PROGRESS NOTE    XCARET MORAD  ZHY:[id number] DOB: April 23, 1936 DOA: 05/01/2022 PCP: [name], NP  [id number]  LOS: 3 days   Brief hospital course: No notes on file  Assessment & Plan: NYANA HAREN is a 86 y.o. female with medical history significant for ILD, recurrent c diff, ulcerative colitis, chronic pain, chronic hypoxic respiratory failure, who presents with dyspnea and palpitations.   Reports a decline of several months with acute worsening the past few days. Reports palpitations with minimal movement present for several weeks. No chest pain. Reports having to increase baseline home o2 of 2-3 liters to 4 liters lately, has had several months of hypoxia, and has worsened over past few weeks to being dyspneic at rest worse with minimal ambulation.   # Acute on chronic hypoxemic respiratory failure --recently, pt has been on 4-5L home O2.  Pt was put on NRB in the ED with Sats of 94%.  Currently on 11L --covid and RVP neg.  likely progressive ILD, pulm edema, and small PE contributing.  Questionable PNA.  Started on tx for all 4 etiologies on admission. --Continue supplemental O2 to keep sats between 88-92%, wean as tolerated  # ILD Underlying severe ILD that is likely progressing but at least several days of acutely worsening dyspnea.  --started on prednisone 50 mg daily on admission --pulm consulted, Dr. Lanney Gins Plan: --cont IV solumedrol, per pulm  # Acute on chronic diastolic CHF --pulm edema seen on CT, elevated BNP --started on IV lasix 40 mg daily Plan: --hold further diuretic due to worsening diarrhea   # Pulmonary embolus, acute Small on CT, doubt it's the main driver for her dyspnea.  Started on Eliquis Plan: --cont Eliquis   # CAP, ruled out --started on cefepime on admission --ID consulted, and ruled out PNA, abx d/c'ed  # History recurrent C diff --has multiple BM's per day, sometimes loose, but pt said that's her baseline --C diff  neg on presentation  # Chronic pain - home norco prn   # Palpitations Likely consequence of dyspnea. Here sinus rhythm, no anemia   # UC No apparent flare. On entyvio ever 2 weeks at home  # Diarrhea, worsening --started having more frequent diarrhea starting night of 9/1.  C diff neg on presentation.  Likely due to abx pt received during the first 2 days. Plan: --GI consult today, due to pt and family concerns --Avoid antidiarrheals, per GI   DVT prophylaxis: [id number] Code Status: Full code  Family Communication: daughter updated at bedside today Level of care: Progressive Dispo:   The patient is from: home Anticipated d/c is to: home Anticipated d/c date is: > 3 days   Subjective and Interval History:  Pt complained of more frequent diarrhea since last night.     Objective: Vitals:   05/04/22 0008 05/04/22 0444 05/04/22 0811 05/04/22 1221  BP: 116/65 117/60 124/78 109/69  Pulse: 79 78 62 94  Resp: 18 18 16 17   Temp: 97.6 F (36.4 C) 97.6 F (36.4 C) 97.7 F (36.5 C)   TempSrc: Oral     SpO2: (!) 75% 98% 98% 96%  Weight:      Height:        Intake/Output Summary (Last 24 hours) at 05/04/2022 1637 Last data filed at 05/04/2022 1417 Gross per 24 hour  Intake 360 ml  Output 1650 ml  Net -1290 ml   Filed Weights   05/01/22 0753  Weight: 49.4 kg    Examination:  Constitutional: NAD, AAOx3 HEENT: conjunctivae and lids normal, EOMI CV: No cyanosis.   RESP: normal respiratory effort, on 11L Neuro: II - XII grossly intact.   Psych: Normal mood and affect.  Appropriate judgement and reason   Data Reviewed: I have personally reviewed labs and imaging studies  Time spent: 50 minutes  Enzo Bi, MD [facility] If 7PM-7AM, please contact night-coverage 05/04/2022, 4:37 PM

## 2022-05-04 NOTE — Consult Note (Signed)
[facility] GI Inpatient Consult Note   [name], M.D.  Reason for Consult: Diarrhea   Attending Requesting Consult: Enzo Bi, M.D.   History of Present Illness: [name] is a 86 y.o. female admitted for hypoxemia, respiratory distress in setting of CHF exacerbation, pulmonary edema, PE and chronic ILD. Patient under the care of [name], MD of [facility] for chronic ulcerative colitis, on Entyvio therapy.  GI service on call asked to see patient by family due to diarrhea exacerbation after beginning antibiotics for Community acquired pneumonia. Patient currently on Nasal cannula oxygen, mildly SOB at rest. Patient says she has several (5-7) loose stools daily even prior to hospitalization. She has undergone several therapies for hx of ulcerative colitis and they have not apparently worked. She is now on Entyvio with minimal results, but she just began therapy and has only undergone two infusions thus far. She has a hx of recurrent C. Dif colitis but has been checked here and has tested negative.  Mainly seeing patient today to address concerns of diarrhea not responding to imodium.   Past Medical History:  Past Medical History:  Diagnosis Date   Altered mental state    Anginal pain ([place])    Anxiety    Basal cell carcinoma 05/03/2021   right neck postauricular - Excised 06/12/21   BMI 30.0-30.9,adult    Cellulitis    Chest pain, atypical    Compression fracture of L4 lumbar vertebra    Constipation    COVID-19 virus infection 05/28/2020   Cystitis    Cystocele    Diverticulosis    Fatigue    Fibrosis, idiopathic pulmonary (HCC)    Gastroenteritis    History of back surgery    History of herniated intervertebral disc    HTN (hypertension)    Hypercholesteremia    Hypocalcemia    Incontinence of urine    OA (osteoarthritis)    shoulder and back   Obesity    Osteopenia    Pneumonia    Shortness of breath    Sleep apnea     Squamous cell carcinoma of skin 07/16/2016   Right upper lateral eyebrow. SCCis.   Squamous cell carcinoma of skin 01/13/2018   Left temporal hairline. WD SCC with superficial infiltration.   Thrush    Vitamin D deficiency     Problem List: Patient Active Problem List   Diagnosis Date Noted   Acute respiratory failure with hypoxia ([place])    Interstitial lung disease ([place])    CAP (community acquired pneumonia) 05/01/2022   Acute pulmonary embolism ([place]) 05/01/2022   Ulcerative colitis ([place]) 02/26/2022   Preventative health care 02/26/2022   Fall (06/28/2021) 07/03/2021   Chronic shoulder pain (Left) 05/17/2021   History of total shoulder replacement (Left) 05/17/2021   Pain in shoulder region after shoulder replacement (Left) 05/17/2021   History of arthroplasty of shoulder (Left) 05/17/2021   Osteoporosis with current pathological fracture, sequela 04/24/2021   Osteoporosis with pathological fracture of lumbar vertebra ([place]) 04/24/2021   Trigger point of shoulder region (Left) 04/24/2021   Tendinitis of triceps (Left) 04/24/2021   Chronic upper back pain 04/24/2021   Chronic thoracic back pain (Left) 04/24/2021   Trigger point with back pain 04/24/2021   Lower extremity weakness (Bilateral) 04/24/2021   Myofascial pain syndrome of lumbar spine 04/24/2021   Myofascial pain syndrome of thoracic spine 04/24/2021   Cervicalgia 04/24/2021   Myalgia, other site 04/24/2021   Chronic radicular pain of  lower back 02/13/2021   Lumbar spondylosis 02/13/2021   Lumbosacral facet arthropathy (Multilevel) (Bilateral) 01/16/2021   Lumbar facet syndrome 01/16/2021   Spondylosis without myelopathy or radiculopathy, lumbosacral region 01/16/2021   DDD (degenerative disc disease), lumbosacral 01/16/2021   DDD (degenerative disc disease), thoracolumbar 01/16/2021   Chronic use of opiate for therapeutic purpose 01/16/2021   Abnormal MRI, lumbar spine (12/06/2020) 01/15/2021   Compression fracture  of T12 thoracic vertebra (w/ retropulsion), sequela 01/15/2021   Compression fracture of L1 lumbar vertebra, sequela 01/15/2021   Compression fracture of L3 lumbar vertebra, sequela 01/15/2021   Chronic pain syndrome 01/03/2021   Pharmacologic therapy 01/03/2021   Disorder of skeletal system 01/03/2021   Problems influencing health status 01/03/2021   Chronic groin pain (2ry area of Pain) (Right) 01/03/2021   Chronic low back pain (1ry area of Pain) (Bilateral) (L>R) w/o sciatica 01/03/2021   Chronic hip pain after total replacement (Right) 01/03/2021   Chronic shoulder pain (3ry area of Pain) (Bilateral) (L>R) 01/03/2021   Persistent cough for 3 weeks or longer 12/20/2020   C. 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    Past Surgical History: Past Surgical History:  Procedure Laterality Date   BACK SURGERY     CATARACT EXTRACTION W/PHACO Left 04/24/2016   Procedure: CATARACT EXTRACTION PHACO AND INTRAOCULAR LENS PLACEMENT ([place]);  Surgeon: Leandrew Koyanagi, MD;  Location: [place];  Service: Ophthalmology;  Laterality: Left;   CATARACT EXTRACTION W/PHACO Right 07/14/2017   Procedure: CATARACT EXTRACTION PHACO AND INTRAOCULAR LENS PLACEMENT ([place])  RIGHT;  Surgeon: Leandrew Koyanagi, MD;  Location: [place];  Service: Ophthalmology;  Laterality: Right;   CHOLECYSTECTOMY     COLONOSCOPY WITH PROPOFOL N/A 02/01/2022    Procedure: COLONOSCOPY WITH PROPOFOL;  Surgeon: [name], MD;  Location: [facility] ENDOSCOPY;  Service: Gastroenterology;  Laterality: N/A;   CYSTOSCOPY/URETEROSCOPY/HOLMIUM LASER/STENT PLACEMENT Left 04/27/2021   Procedure: CYSTOSCOPY/URETEROSCOPY/HOLMIUM LASER/STENT PLACEMENT;  Surgeon: Billey Co, MD;  Location: [facility] ORS;  Service: Urology;  Laterality: Left;   ESOPHAGOGASTRODUODENOSCOPY (EGD) WITH PROPOFOL N/A 10/13/2018   Procedure: ESOPHAGOGASTRODUODENOSCOPY (EGD) WITH PROPOFOL;  Surgeon: [name], MD;  Location: [facility] ENDOSCOPY;  Service: Gastroenterology;  Laterality: N/A;   ESOPHAGOGASTRODUODENOSCOPY (EGD) WITH PROPOFOL N/A 11/22/2021   Procedure: ESOPHAGOGASTRODUODENOSCOPY (EGD) WITH PROPOFOL;  Surgeon: [name], MD;  Location: [facility] ENDOSCOPY;  Service: Gastroenterology;  Laterality: N/A;   EYE SURGERY     FLEXIBLE SIGMOIDOSCOPY N/A 10/13/2018   Procedure: FLEXIBLE SIGMOIDOSCOPY;  Surgeon: [name], MD;  Location: [facility] ENDOSCOPY;  Service: Gastroenterology;  Laterality: N/A;   FLEXIBLE SIGMOIDOSCOPY N/A 09/09/2019   Procedure: FLEXIBLE SIGMOIDOSCOPY;  Surgeon: [name], MD;  Location: [facility] ENDOSCOPY;  Service: Gastroenterology;  Laterality: N/A;   FLEXIBLE SIGMOIDOSCOPY N/A 08/04/2020   Procedure: FLEXIBLE SIGMOIDOSCOPY;  Surgeon: [name], MD;  Location: [facility] ENDOSCOPY;  Service: Gastroenterology;  Laterality: N/A;  Per Dr. Vicente Males, unsedated   [place] N/A 08/31/2021   Procedure: FLEXIBLE SIGMOIDOSCOPY;  Surgeon: [name], MD;  Location: [facility] ENDOSCOPY;  Service: Gastroenterology;  Laterality:  N/A;  no sedation   HIP ARTHROPLASTY Right 09/27/2020   Procedure: ARTHROPLASTY BIPOLAR HIP (HEMIARTHROPLASTY);  Surgeon: Hessie Knows, MD;  Location: [facility] ORS;  Service: Orthopedics;  Laterality: Right;   JOINT REPLACEMENT     ROTATOR CUFF REPAIR Bilateral    left-12/2009; right-03/2009 dr.califf   TONSILLECTOMY     TOTAL SHOULDER REPLACEMENT Left      Allergies: No Known Allergies  Home Medications: Medications Prior to Admission  Medication Sig Dispense Refill Last Dose   HYDROcodone-acetaminophen (NORCO/VICODIN) 5-325 MG tablet Take 1 tablet by mouth 3 (three) times daily as needed.   04/30/2022   OXYGEN Inhale 4 L into the lungs at bedtime.   04/30/2022 at pm   Biotin 10 MG CAPS Take 1 capsule by mouth daily.   04/26/2022   citalopram (CELEXA) 40 MG tablet Take 40 mg by mouth daily.   04/26/2022   denosumab (PROLIA) 60 MG/ML SOSY injection Inject 60 mg into the skin every 6 (six) months.   11/13/2021   ENTYVIO 300 MG injection Inject 300 mg into the vein every 14 (fourteen) days.   04/18/2022   ESBRIET 267 MG TABS Take 267 mg by mouth 3 (three) times daily with meals.   04/26/2022   Magnesium 500 MG CAPS Take 1 capsule by mouth as needed.   04/26/2022   omeprazole (PRILOSEC) 40 MG capsule Take 1 capsule (40 mg total) by mouth daily. 90 capsule 3 04/26/2022   vitamin B-12 (CYANOCOBALAMIN) 1000 MCG tablet Take 1,000 mcg by mouth every other day.   04/26/2022   VOLTAREN 1 % GEL Apply 2 g topically in the morning, at noon, and at bedtime. Shoulders and back  2 prn at prn   Home medication reconciliation was completed with the patient.   Scheduled Inpatient Medications:    apixaban  10 mg Oral BID   Followed by   Derrill Memo ON 05/08/2022] apixaban  5 mg Oral BID   Chlorhexidine Gluconate Cloth  6 each Topical Daily   chlorpheniramine-HYDROcodone  2.5 mL Oral TID   citalopram  40 mg Oral Daily   feeding supplement  237 mL Oral BID BM   furosemide  40 mg Intravenous BID   methylPREDNISolone (SOLU-MEDROL) injection  60 mg Intravenous Q24H   metolazone  5 mg Oral Daily   sodium chloride flush  3 mL Intravenous Q12H   sulfamethoxazole-trimethoprim  1 tablet Oral Daily    Continuous Inpatient Infusions:    sodium chloride      PRN Inpatient Medications:  sodium chloride, loperamide, sodium chloride flush  Family History: family history  includes COPD in her mother; Cancer in her brother.   GI Family History: Negative  Social History:   reports that she has never smoked. She has never used smokeless tobacco. She reports that she does not drink alcohol and does not use drugs. The patient denies ETOH, tobacco, or drug use.    Review of Systems: Review of Systems - Negative except HPI  Physical Examination: BP 109/69 (BP Location: Left Arm)   Pulse 94   Temp 97.7 F (36.5 C)   Resp 17   Ht 4' 10"  (1.473 m)   Wt 49.4 kg   SpO2 96%   BMI 22.78 kg/m  Physical Exam Constitutional:      General: She is not in acute distress.    Appearance: She is ill-appearing. She is not toxic-appearing or diaphoretic.     Interventions: She is not intubated. HENT:     Head: Normocephalic  and atraumatic.  Eyes:     Pupils: Pupils are equal, round, and reactive to light.  Neck:     Thyroid: No thyromegaly.     Trachea: No tracheal deviation.  Cardiovascular:     Rate and Rhythm: Normal rate. No extrasystoles are present.    Heart sounds: No murmur heard.    No gallop.  Pulmonary:     Effort: No bradypnea, accessory muscle usage or respiratory distress. She is not intubated.     Breath sounds: No stridor. Examination of the right-middle field reveals decreased breath sounds. Examination of the left-middle field reveals decreased breath sounds. Decreased breath sounds present. No wheezing, rhonchi or rales.  Abdominal:     General: Bowel sounds are normal.     Palpations: Abdomen is soft. There is no hepatomegaly or splenomegaly.     Tenderness: There is no abdominal tenderness. There is no guarding or rebound.  Musculoskeletal:     Cervical back: Normal range of motion.  Neurological:     General: No focal deficit present.     Mental Status: She is alert.  Psychiatric:        Mood and Affect: Mood normal.     Data: Lab Results  Component Value Date   WBC 7.9 05/04/2022   HGB 12.6 05/04/2022   HCT 39.8 05/04/2022    MCV 87.5 05/04/2022   PLT 395 05/04/2022   Recent Labs  Lab 05/02/22 1155 05/03/22 0542 05/04/22 0813  HGB 12.5 10.1* 12.6   Lab Results  Component Value Date   NA 140 05/04/2022   K 2.9 (L) 05/04/2022   CL 94 (L) 05/04/2022   CO2 33 (H) 05/04/2022   BUN 30 (H) 05/04/2022   CREATININE 0.82 05/04/2022   Lab Results  Component Value Date   ALT 7 05/01/2022   AST 30 05/01/2022   ALKPHOS 94 05/01/2022   BILITOT 0.7 05/01/2022   No results for input(s): "APTT", "INR", "PTT" in the last 168 hours.    Latest Ref Rng & Units 05/04/2022    8:13 AM 05/03/2022    5:42 AM 05/02/2022   11:55 AM  CBC  WBC 4.0 - 10.5 K/uL 7.9  6.4  10.8   Hemoglobin 12.0 - 15.0 g/dL 12.6  10.1  12.5   Hematocrit 36.0 - 46.0 % 39.8  32.5  39.4   Platelets 150 - 400 K/uL 395  274  350     STUDIES: DG Chest Port 1 View  Result Date: 05/04/2022 CLINICAL DATA:  Admitted due to dyspnea and palpitations. Reports a decline of several months with acute worsening the past few days. Reports palpitations with minimal movement present for several weeks. No chest pain. Reports having to increase baseline home o2 of 2-3 liters to 4 liters lately, has had several months of hypoxia, and has worsened over past few weeks EXAM: PORTABLE CHEST 1 VIEW COMPARISON:  05/01/2022 and older studies. FINDINGS: Interstitial and hazy airspace lung opacities have mildly improved. Lung volumes remain low accentuating the lung opacities. No convincing pleural effusion and no pneumothorax. IMPRESSION: 1. Interval improvement. Mild decrease in the interstitial and hazy airspace lung opacities since the most recent prior study. No new abnormalities. Electronically Signed   By: Lajean Manes M.D.   On: 05/04/2022 09:27   @IMAGES @  Assessment:  Diarrhea - Acute on chronic - likely secondary to antibiotic side effects (cefipime, bactrim). Appears stable. History of Clostridioides dificile diarrhea - presumed resolved based on unremarkable stool  testing. CAP -  on Bactrim. Hypoxic respiratory distress. Interstitial lung disease. 6.   CUC - as per #1  Recommendations:  Avoid antidiarrheals. Continue IV hydration. Reassurance. Follow up with Dr. Vicente Males (GI) after hospital discharge. Patient advised she will need to likely avoid her next scheduled dose of Entyvio secondary to CAP.  Thank you for the consult. Please call with questions or concerns.  Olean Ree, "Lanny Hurst MD [facility] [place], [place] [address] [phone]  05/04/2022 3:02 PM

## 2022-05-05 DIAGNOSIS — J9621 Acute and chronic respiratory failure with hypoxia: Secondary | ICD-10-CM | POA: Diagnosis not present

## 2022-05-05 LAB — BASIC METABOLIC PANEL
Anion gap: 11 (ref 5–15)
BUN: 38 mg/dL — ABNORMAL HIGH (ref 8–23)
CO2: 32 mmol/L (ref 22–32)
Calcium: 9.2 mg/dL (ref 8.9–10.3)
Chloride: 94 mmol/L — ABNORMAL LOW (ref 98–111)
Creatinine, Ser: 1.08 mg/dL — ABNORMAL HIGH (ref 0.44–1.00)
GFR, Estimated: 51 mL/min — ABNORMAL LOW (ref >60)
Glucose, Bld: 142 mg/dL — ABNORMAL HIGH (ref 70–99)
Potassium: 4.2 mmol/L (ref 3.5–5.1)
Sodium: 137 mmol/L (ref 135–145)

## 2022-05-05 LAB — CBC
HCT: 39.8 % (ref 36.0–46.0)
Hemoglobin: 12.5 g/dL (ref 12.0–15.0)
MCH: 27.7 pg (ref 26.0–34.0)
MCHC: 31.4 g/dL (ref 30.0–36.0)
MCV: 88.2 fL (ref 80.0–100.0)
Platelets: 444 10*3/uL — ABNORMAL HIGH (ref 150–400)
RBC: 4.51 MIL/uL (ref 3.87–5.11)
RDW: 15.2 % (ref 11.5–15.5)
WBC: 11 10*3/uL — ABNORMAL HIGH (ref 4.0–10.5)
nRBC: 0 % (ref 0.0–0.2)

## 2022-05-05 LAB — MAGNESIUM: Magnesium: 2.7 mg/dL — ABNORMAL HIGH (ref 1.7–2.4)

## 2022-05-05 MED ORDER — RISAQUAD PO CAPS
2.0000 | ORAL_CAPSULE | Freq: Three times a day (TID) | ORAL | Status: DC
  Administered 2022-05-05 – 2022-05-23 (×53): 2 via ORAL
  Filled 2022-05-05 (×54): qty 2

## 2022-05-05 MED ORDER — BACID PO TABS: 2.0000 | ORAL_TABLET | Freq: Three times a day (TID) | ORAL | Status: DC

## 2022-05-05 MED ORDER — ALBUTEROL SULFATE (2.5 MG/3ML) 0.083% IN NEBU
2.5000 mg | INHALATION_SOLUTION | Freq: Four times a day (QID) | RESPIRATORY_TRACT | Status: DC
  Administered 2022-05-05 (×2): 2.5 mg via RESPIRATORY_TRACT
  Filled 2022-05-05 (×3): qty 3

## 2022-05-05 NOTE — Progress Notes (Signed)
PROGRESS NOTE    ZSOFIA PROUT  TGG:[id number] DOB: 13-May-1936 DOA: 05/01/2022 PCP: [name], NP  [id number]  LOS: 4 days   Brief hospital course: No notes on file  Assessment & Plan: [name] is a 86 y.o. female with medical history significant for ILD, recurrent c diff, ulcerative colitis, chronic pain, chronic hypoxic respiratory failure, who presents with dyspnea and palpitations.   Reports a decline of several months with acute worsening the past few days. Reports palpitations with minimal movement present for several weeks. No chest pain. Reports having to increase baseline home o2 of 2-3 liters to 4 liters lately, has had several months of hypoxia, and has worsened over past few weeks to being dyspneic at rest worse with minimal ambulation.   # Acute on chronic hypoxemic respiratory failure --recently, pt has been on 4-5L home O2.  Pt was put on NRB in the ED with Sats of 94%.  Currently on 11-12L --covid and RVP neg.  likely progressive ILD, pulm edema, and small PE contributing.  Questionable PNA.  Started on tx for all 4 etiologies on admission. --Continue supplemental O2 to keep sats between 88-92%, wean as tolerated  # ILD Underlying severe ILD that is likely progressing but at least several days of acutely worsening dyspnea.  --started on prednisone 50 mg daily on admission --pulm consulted, Dr. Lanney Gins Plan: --cont IV solumedrol, per pulm --Albuterol neb scheduled, per pulm  # Acute on chronic diastolic CHF --pulm edema seen on CT, elevated BNP --started on IV lasix 40 mg daily, d/c'ed on 9/2 Plan: --hold further diuretics due to diarrhea   # Pulmonary embolus, acute Small on CT, doubt it's the main driver for her dyspnea.  Started on Eliquis Plan: --cont Eliquis   # CAP, ruled out --started on cefepime on admission --ID consulted, and ruled out PNA, abx d/c'ed  # History recurrent C diff --has multiple BM's per day, sometimes  loose, but pt said that's her baseline --C diff neg on presentation  # Chronic pain - home norco prn   # Palpitations Likely consequence of dyspnea. Here sinus rhythm, no anemia   # UC No apparent flare. On entyvio ever 2 weeks at home  # Diarrhea, worsening --started having more frequent diarrhea starting night of 9/1.  C diff neg on presentation.  Likely due to abx pt received during the first 2 days. --GI consulted Plan: --Avoid antidiarrheals, per GI  Hypokalemia --likely due to diarrhea --monitor and replete PRN   DVT prophylaxis: [id number] Code Status: Full code  Family Communication:  Level of care: Progressive Dispo:   The patient is from: home Anticipated d/c is to: home Anticipated d/c date is: > 3 days   Subjective and Interval History:  Pt reported feeling very tired and weak.  Still having diarrhea.   Objective: Vitals:   05/05/22 0839 05/05/22 1226 05/05/22 1421 05/05/22 1635  BP: 115/69 97/75  (!) 105/59  Pulse: 82 93 92 85  Resp: 17 19 18 18   Temp:  (!) 97.5 F (36.4 C)  97.9 F (36.6 C)  TempSrc:      SpO2: 94% 90% 92% 97%  Weight:      Height:       No intake or output data in the 24 hours ending 05/05/22 1808  Filed Weights   05/01/22 0753  Weight: 49.4 kg    Examination:   Constitutional: NAD, AAOx3 HEENT: conjunctivae and lids normal, EOMI CV: No cyanosis.   RESP:  normal respiratory effort, on 12L Extremities: No effusions, edema in BLE SKIN: warm, dry Neuro: II - XII grossly intact.   Psych: Normal mood and affect.  Appropriate judgement and reason   Data Reviewed: I have personally reviewed labs and imaging studies  Time spent: 35 minutes  Enzo Bi, MD [facility] If 7PM-7AM, please contact night-coverage 05/05/2022, 6:08 PM

## 2022-05-05 NOTE — Progress Notes (Signed)
       CROSS COVER NOTE  NAME: [name] MRN: [id number] DOB : [birth year]    Date of Service   05/05/2022   HPI/Events of Note   Notified by nursing that patient had maroon colored type 6-7 stool output ~150 mL. The RN tonight had patient last week and reports this has been ongoing since at least Friday night. She is asymptomatic and hemodynamically stable.   Today's Vitals   05/05/22 1635 05/05/22 2010 05/05/22 2129 05/05/22 2349  BP: (!) 105/59 (!) 106/58  112/63  Pulse: 85 88  77  Resp: 18 18  18   Temp: 97.9 F (36.6 C) 97.6 F (36.4 C)  (!) 97.5 F (36.4 C)  TempSrc:  Oral    SpO2: 97% 98% 90% 98%  Weight:      Height:      PainSc:       Body mass index is 22.78 kg/m.    Interventions   Plan:  Hematochezia  H&H--> HGB 10.9 down from 12.5 prior Hold Eliquis tonight GI is already consulted and following the case      This document was prepared using Dragon voice recognition software and may include unintentional dictation errors.  Neomia Glass DNP, MHA, FNP-BC Nurse Practitioner [facility] Pager [phone]

## 2022-05-06 DIAGNOSIS — J9621 Acute and chronic respiratory failure with hypoxia: Secondary | ICD-10-CM | POA: Diagnosis not present

## 2022-05-06 DIAGNOSIS — I4891 Unspecified atrial fibrillation: Secondary | ICD-10-CM

## 2022-05-06 LAB — BASIC METABOLIC PANEL
Anion gap: 14 (ref 5–15)
Anion gap: 8 (ref 5–15)
BUN: 47 mg/dL — ABNORMAL HIGH (ref 8–23)
BUN: 51 mg/dL — ABNORMAL HIGH (ref 8–23)
CO2: 31 mmol/L (ref 22–32)
CO2: 32 mmol/L (ref 22–32)
Calcium: 9.4 mg/dL (ref 8.9–10.3)
Calcium: 8.8 mg/dL — ABNORMAL LOW (ref 8.9–10.3)
Chloride: 94 mmol/L — ABNORMAL LOW (ref 98–111)
Chloride: 97 mmol/L — ABNORMAL LOW (ref 98–111)
Creatinine, Ser: 1.68 mg/dL — ABNORMAL HIGH (ref 0.44–1.00)
Creatinine, Ser: 1.45 mg/dL — ABNORMAL HIGH (ref 0.44–1.00)
GFR, Estimated: 30 mL/min — ABNORMAL LOW (ref >60)
GFR, Estimated: 36 mL/min — ABNORMAL LOW (ref >60)
Glucose, Bld: 131 mg/dL — ABNORMAL HIGH (ref 70–99)
Glucose, Bld: 148 mg/dL — ABNORMAL HIGH (ref 70–99)
Potassium: 4.7 mmol/L (ref 3.5–5.1)
Potassium: 3.9 mmol/L (ref 3.5–5.1)
Sodium: 139 mmol/L (ref 135–145)
Sodium: 137 mmol/L (ref 135–145)

## 2022-05-06 LAB — CULTURE, BLOOD (ROUTINE X 2)
Culture: NO GROWTH
Special Requests: ADEQUATE

## 2022-05-06 LAB — HEMOGLOBIN AND HEMATOCRIT, BLOOD
HCT: 31.9 % — ABNORMAL LOW (ref 36.0–46.0)
Hemoglobin: 10 g/dL — ABNORMAL LOW (ref 12.0–15.0)

## 2022-05-06 LAB — MAGNESIUM
Magnesium: 2.5 mg/dL — ABNORMAL HIGH (ref 1.7–2.4)
Magnesium: 2.2 mg/dL (ref 1.7–2.4)

## 2022-05-06 LAB — CBC
HCT: 34.9 % — ABNORMAL LOW (ref 36.0–46.0)
Hemoglobin: 10.9 g/dL — ABNORMAL LOW (ref 12.0–15.0)
MCH: 27.9 pg (ref 26.0–34.0)
MCHC: 31.2 g/dL (ref 30.0–36.0)
MCV: 89.3 fL (ref 80.0–100.0)
Platelets: 457 10*3/uL — ABNORMAL HIGH (ref 150–400)
RBC: 3.91 MIL/uL (ref 3.87–5.11)
RDW: 15.3 % (ref 11.5–15.5)
WBC: 13.6 10*3/uL — ABNORMAL HIGH (ref 4.0–10.5)
nRBC: 0 % (ref 0.0–0.2)

## 2022-05-06 MED ORDER — METOPROLOL TARTRATE 5 MG/5ML IV SOLN
5.0000 mg | Freq: Once | INTRAVENOUS | Status: DC
Start: 2022-05-06 — End: 2022-05-06
  Filled 2022-05-06: qty 5

## 2022-05-06 MED ORDER — APIXABAN 5 MG PO TABS: 5.0000 mg | ORAL_TABLET | Freq: Two times a day (BID) | ORAL | Status: DC

## 2022-05-06 MED ORDER — SULFAMETHOXAZOLE-TRIMETHOPRIM 400-80 MG PO TABS: 1.0000 | ORAL_TABLET | ORAL | Status: DC

## 2022-05-06 MED ORDER — APIXABAN 5 MG PO TABS
10.0000 mg | ORAL_TABLET | Freq: Two times a day (BID) | ORAL | Status: DC
  Administered 2022-05-06: 10 mg via ORAL
  Filled 2022-05-06: qty 2

## 2022-05-06 MED ORDER — AMIODARONE LOAD VIA INFUSION
150.0000 mg | Freq: Once | INTRAVENOUS | Status: AC
  Administered 2022-05-06: 150 mg via INTRAVENOUS
  Filled 2022-05-06: qty 83.34

## 2022-05-06 MED ORDER — SODIUM CHLORIDE 0.9 % IV SOLN: INTRAVENOUS | Status: AC

## 2022-05-06 MED ORDER — AMIODARONE HCL IN DEXTROSE 360-4.14 MG/200ML-% IV SOLN
30.0000 mg/h | INTRAVENOUS | Status: DC
  Administered 2022-05-07 – 2022-05-08 (×3): 30 mg/h via INTRAVENOUS
  Filled 2022-05-06 (×3): qty 200

## 2022-05-06 MED ORDER — ALBUTEROL SULFATE (2.5 MG/3ML) 0.083% IN NEBU
2.5000 mg | INHALATION_SOLUTION | Freq: Three times a day (TID) | RESPIRATORY_TRACT | Status: DC
  Administered 2022-05-06 – 2022-05-09 (×10): 2.5 mg via RESPIRATORY_TRACT
  Filled 2022-05-06 (×10): qty 3

## 2022-05-06 MED ORDER — AMIODARONE HCL IN DEXTROSE 360-4.14 MG/200ML-% IV SOLN
60.0000 mg/h | INTRAVENOUS | Status: AC
  Administered 2022-05-06 – 2022-05-07 (×2): 60 mg/h via INTRAVENOUS
  Filled 2022-05-06 (×2): qty 200

## 2022-05-06 MED ORDER — CITALOPRAM HYDROBROMIDE 20 MG PO TABS
10.0000 mg | ORAL_TABLET | Freq: Every day | ORAL | Status: DC
  Administered 2022-05-07 – 2022-05-23 (×17): 10 mg via ORAL
  Filled 2022-05-06 (×17): qty 1

## 2022-05-06 NOTE — Progress Notes (Addendum)
PROGRESS NOTE    [name]  OFB:[id number] DOB: [birth year] DOA: 05/01/2022 PCP: [name], NP  [id number]  LOS: 5 days   Brief hospital course: No notes on file  Assessment & Plan: [name] is a 86 y.o. female with medical history significant for ILD, recurrent c diff, ulcerative colitis, chronic pain, chronic hypoxic respiratory failure, who presents with dyspnea and palpitations.   Reports a decline of several months with acute worsening the past few days. Reports palpitations with minimal movement present for several weeks. No chest pain. Reports having to increase baseline home o2 of 2-3 liters to 4 liters lately, has had several months of hypoxia, and has worsened over past few weeks to being dyspneic at rest worse with minimal ambulation.   # Acute on chronic hypoxemic respiratory failure --recently, pt has been on 4-5L home O2.  Pt was put on NRB in the ED with Sats of 94%.  Up to 11-12L, currently on 9L --covid and RVP neg.  likely progressive ILD, pulm edema, and small PE contributing.  Questionable PNA.  Started on tx for all 4 etiologies on admission. --Continue supplemental O2 to keep sats between 88-92%, wean as tolerated  # ILD Underlying severe ILD that is likely progressing but at least several days of acutely worsening dyspnea.  --started on prednisone 50 mg daily on admission --pulm consulted, Dr. Lanney Gins Plan: --cont IV solumedrol, per pulm --Albuterol neb scheduled, per pulm  # Acute on chronic diastolic CHF --pulm edema seen on CT, elevated BNP --started on IV lasix 40 mg daily, d/c'ed on 9/2 Plan: --hold further diuretics due to diarrhea   # Pulmonary embolus, acute Small on CT, doubt it's the main driver for her dyspnea.  Started on Eliquis Plan: --hold Eliquis for now due to bloody diarrhea  Addendum:  Per discussion with GI, pt should be continued on Eliquis for now.   # CAP, ruled out --started on cefepime on  admission --ID consulted, and ruled out PNA, abx d/c'ed  # History recurrent C diff --has multiple BM's per day, sometimes loose, but pt said that's her baseline --C diff neg on presentation  # Chronic pain - home norco prn   # Palpitations Likely consequence of dyspnea. Here sinus rhythm, no anemia   # UC No apparent flare. On entyvio ever 2 weeks at home  # Diarrhea, worsening --started having more frequent diarrhea starting night of 9/1.  C diff neg on presentation.  Likely due to abx pt received during the first 2 days. --GI consulted --diarrhea appeared bloody Plan: --Avoid antidiarrheals, per GI --hold Eliquis for now  Addendum:  Per discussion with GI, pt should be continued on Eliquis for now.  Hypokalemia --likely due to diarrhea --monitor and replete PRN  AKI --Cr went up to 1.68, from baseline of 0.7 --likely 2/2 to dehydration from diarrhea --NS@100  for 10 hours   DVT prophylaxis: SCD/Compression stockings Code Status: Full code  Family Communication:  Level of care: Progressive Dispo:   The patient is from: home Anticipated d/c is to: home Anticipated d/c date is: > 3 days   Subjective and Interval History:  Pt reportedly has been having maroon colored stools.     Objective: Vitals:   05/06/22 0803 05/06/22 1117 05/06/22 1358 05/06/22 1510  BP: 122/65 100/69  (!) 101/57  Pulse: 73 84  90  Resp: 17 17  18   Temp: (!) 97.3 F (36.3 C) 98.1 F (36.7 C)  (!) 97.5 F (36.4  C)  TempSrc: Oral Oral    SpO2: 98% 100% 97% 90%  Weight:      Height:        Intake/Output Summary (Last 24 hours) at 05/06/2022 1531 Last data filed at 05/06/2022 1426 Gross per 24 hour  Intake 360 ml  Output 200 ml  Net 160 ml    Filed Weights   05/01/22 0753  Weight: 49.4 kg    Examination:   Constitutional: NAD, AAOx3 HEENT: conjunctivae and lids normal, EOMI CV: No cyanosis.   RESP: normal respiratory effort, on 9L Neuro: II - XII grossly intact.    Psych: Normal mood and affect.  Appropriate judgement and reason   Data Reviewed: I have personally reviewed labs and imaging studies  Time spent: 50 minutes  Enzo Bi, MD [facility] If 7PM-7AM, please contact night-coverage 05/06/2022, 3:31 PM

## 2022-05-06 NOTE — Progress Notes (Signed)
       CROSS COVER NOTE  NAME: [name] MRN: [id number] DOB : [birth year]    Date of Service   05/06/2022   HPI/Events of Note   Notified of elevated HR 150--> as high as 161. BP initially stable but later decreased to 86/71.  [name] is a 86 year old female with PMH significant for ILD, recurrent C. difficile, bloody diarrhea, chronic pain, chronic hypoxic respiratory failure who presents to [facility] ED with reports of dyspnea and palpitations. She was found to have a PE and is being treated for CAP.  TSH normal 05/01/22   Interventions   Plan: EKG--> Aflutter HR 156 H&H--> HGB 10 down from 10.9 this morning Amiodarone Load and Infusion Continue Eliquis      This document was prepared using Dragon voice recognition software and may include unintentional dictation errors.  Neomia Glass DNP, MHA, FNP-BC Nurse Practitioner [facility] Pager [phone]

## 2022-05-07 ENCOUNTER — Inpatient Hospital Stay: Payer: Medicare HMO

## 2022-05-07 DIAGNOSIS — J849 Interstitial pulmonary disease, unspecified: Secondary | ICD-10-CM | POA: Diagnosis not present

## 2022-05-07 DIAGNOSIS — I2699 Other pulmonary embolism without acute cor pulmonale: Secondary | ICD-10-CM | POA: Diagnosis not present

## 2022-05-07 DIAGNOSIS — J9601 Acute respiratory failure with hypoxia: Secondary | ICD-10-CM | POA: Diagnosis not present

## 2022-05-07 DIAGNOSIS — J9621 Acute and chronic respiratory failure with hypoxia: Secondary | ICD-10-CM | POA: Diagnosis not present

## 2022-05-07 LAB — CBC
HCT: 27.5 % — ABNORMAL LOW (ref 36.0–46.0)
Hemoglobin: 8.7 g/dL — ABNORMAL LOW (ref 12.0–15.0)
MCH: 28.1 pg (ref 26.0–34.0)
MCHC: 31.6 g/dL (ref 30.0–36.0)
MCV: 88.7 fL (ref 80.0–100.0)
Platelets: 373 10*3/uL (ref 150–400)
RBC: 3.1 MIL/uL — ABNORMAL LOW (ref 3.87–5.11)
RDW: 15.1 % (ref 11.5–15.5)
WBC: 10.5 10*3/uL (ref 4.0–10.5)
nRBC: 0 % (ref 0.0–0.2)

## 2022-05-07 LAB — FERRITIN: Ferritin: 344 ng/mL — ABNORMAL HIGH (ref 11–307)

## 2022-05-07 LAB — BASIC METABOLIC PANEL
Anion gap: 8 (ref 5–15)
BUN: 46 mg/dL — ABNORMAL HIGH (ref 8–23)
CO2: 30 mmol/L (ref 22–32)
Calcium: 8.3 mg/dL — ABNORMAL LOW (ref 8.9–10.3)
Chloride: 97 mmol/L — ABNORMAL LOW (ref 98–111)
Creatinine, Ser: 1.24 mg/dL — ABNORMAL HIGH (ref 0.44–1.00)
GFR, Estimated: 43 mL/min — ABNORMAL LOW (ref >60)
Glucose, Bld: 123 mg/dL — ABNORMAL HIGH (ref 70–99)
Potassium: 3.7 mmol/L (ref 3.5–5.1)
Sodium: 135 mmol/L (ref 135–145)

## 2022-05-07 LAB — CRYPTOCOCCUS ANTIGEN, SERUM: Cryptococcus Antigen, Serum: NEGATIVE

## 2022-05-07 LAB — ASPERGILLUS ANTIGEN, BAL/SERUM: Aspergillus Ag, BAL/Serum: 0.06 Index (ref 0.00–0.49)

## 2022-05-07 LAB — MISC LABCORP TEST (SEND OUT): Labcorp test code: 164284

## 2022-05-07 LAB — MAGNESIUM: Magnesium: 2.2 mg/dL (ref 1.7–2.4)

## 2022-05-07 LAB — C-REACTIVE PROTEIN: CRP: 1 mg/dL — ABNORMAL HIGH (ref <1.0)

## 2022-05-07 MED ORDER — POTASSIUM CHLORIDE CRYS ER 20 MEQ PO TBCR
40.0000 meq | EXTENDED_RELEASE_TABLET | Freq: Once | ORAL | Status: AC
  Administered 2022-05-07: 40 meq via ORAL
  Filled 2022-05-07: qty 2

## 2022-05-07 MED ORDER — SODIUM CHLORIDE 0.9 % IV SOLN: INTRAVENOUS | Status: DC

## 2022-05-07 MED ORDER — METHYLPREDNISOLONE SODIUM SUCC 40 MG IJ SOLR
40.0000 mg | INTRAMUSCULAR | Status: DC
  Administered 2022-05-07 – 2022-05-10 (×4): 40 mg via INTRAVENOUS
  Filled 2022-05-07 (×4): qty 1

## 2022-05-07 NOTE — H&P (View-Only) (Signed)
[facility] Vascular Consult Note  MRN : [id number]  [name] is a 86 y.o. (1936-06-05) female who presents with chief complaint of  Chief Complaint  Patient presents with   Shortness of Breath   Abdominal Pain   Weakness  .   Consulting Physician: Wallis Bamberg, MD Reason for consult: Pulmonary embolism History of Present Illness: [name] is a 86 year old female who presented to [facility] due to acute on chronic hypoxemia.  The patient is currently on home O2.  She had an increased oxygen requirement with worsening cough.  She has a previous medical history of ulcerative colitis pulmonary fibrosis, chronic pain syndrome.  In the midst of work-up it was found that the patient had a noted pulmonary embolism.  Due to the patient's history of ulcerative colitis, and current bloody diarrhea, she is unable to be anticoagulated.  Current Facility-Administered Medications  Medication Dose Route Frequency Provider Last Rate Last Admin   0.9 %  sodium chloride infusion  250 mL Intravenous PRN Wouk, Ailene Rud, MD       0.9 %  sodium chloride infusion   Intravenous Continuous Enzo Bi, MD 50 mL/hr at 05/07/22 1802 New Bag at 05/07/22 1802   acidophilus (RISAQUAD) capsule 2 capsule  2 capsule Oral TID Dallie Piles, RPH   2 capsule at 05/07/22 1702   albuterol (PROVENTIL) (2.5 MG/3ML) 0.083% nebulizer solution 2.5 mg  2.5 mg Nebulization TID Enzo Bi, MD   2.5 mg at 05/07/22 2017   amiodarone (NEXTERONE PREMIX) 360-4.14 MG/200ML-% (1.8 mg/mL) IV infusion  30 mg/hr Intravenous Continuous Foust, Katy L, NP 16.67 mL/hr at 05/07/22 0928 30 mg/hr at 05/07/22 8099   Chlorhexidine Gluconate Cloth 2 % PADS 6 each  6 each Topical Daily Enzo Bi, MD   6 each at 05/05/22 0923   citalopram (CELEXA) tablet 10 mg  10 mg Oral Daily Enzo Bi, MD   10 mg at 05/07/22 8338   feeding supplement (ENSURE ENLIVE / ENSURE PLUS) liquid 237 mL  237 mL  Oral BID BM Enzo Bi, MD   237 mL at 05/07/22 1450   methylPREDNISolone sodium succinate (SOLU-MEDROL) 40 mg/mL injection 40 mg  40 mg Intravenous Q24H Ottie Glazier, MD   40 mg at 05/07/22 1226   sodium chloride flush (NS) 0.9 % injection 3 mL  3 mL Intravenous Q12H Wouk, Ailene Rud, MD   3 mL at 05/07/22 0916   sodium chloride flush (NS) 0.9 % injection 3 mL  3 mL Intravenous PRN Wouk, Ailene Rud, MD        Past Medical History:  Diagnosis Date   Altered mental state    Anginal pain ([place])    Anxiety    Basal cell carcinoma 05/03/2021   right neck postauricular - Excised 06/12/21   BMI 30.0-30.9,adult    Cellulitis    Chest pain, atypical    Compression fracture of L4 lumbar vertebra    Constipation    COVID-19 virus infection 05/28/2020   Cystitis    Cystocele    Diverticulosis    Fatigue    Fibrosis, idiopathic pulmonary (HCC)    Gastroenteritis    History of back surgery    History of herniated intervertebral disc    HTN (hypertension)    Hypercholesteremia    Hypocalcemia    Incontinence of urine    OA (osteoarthritis)    shoulder and back   Obesity    Osteopenia    Pneumonia  Shortness of breath    Sleep apnea    Squamous cell carcinoma of skin 07/16/2016   Right upper lateral eyebrow. SCCis.   Squamous cell carcinoma of skin 01/13/2018   Left temporal hairline. WD SCC with superficial infiltration.   Thrush    Vitamin D deficiency     Past Surgical History:  Procedure Laterality Date   BACK SURGERY     CATARACT EXTRACTION W/PHACO Left 04/24/2016   Procedure: CATARACT EXTRACTION PHACO AND INTRAOCULAR LENS PLACEMENT (IOC);  Surgeon: Leandrew Koyanagi, MD;  Location: [place];  Service: Ophthalmology;  Laterality: Left;   CATARACT EXTRACTION W/PHACO Right 07/14/2017   Procedure: CATARACT EXTRACTION PHACO AND INTRAOCULAR LENS PLACEMENT ([place])  RIGHT;  Surgeon: Leandrew Koyanagi, MD;  Location: [place];  Service: Ophthalmology;   Laterality: Right;   CHOLECYSTECTOMY     COLONOSCOPY WITH PROPOFOL N/A 02/01/2022   Procedure: COLONOSCOPY WITH PROPOFOL;  Surgeon: [name], MD;  Location: [facility] ENDOSCOPY;  Service: Gastroenterology;  Laterality: N/A;   CYSTOSCOPY/URETEROSCOPY/HOLMIUM LASER/STENT PLACEMENT Left 04/27/2021   Procedure: CYSTOSCOPY/URETEROSCOPY/HOLMIUM LASER/STENT PLACEMENT;  Surgeon: Billey Co, MD;  Location: [facility] ORS;  Service: Urology;  Laterality: Left;   ESOPHAGOGASTRODUODENOSCOPY (EGD) WITH PROPOFOL N/A 10/13/2018   Procedure: ESOPHAGOGASTRODUODENOSCOPY (EGD) WITH PROPOFOL;  Surgeon: [name], MD;  Location: [facility] ENDOSCOPY;  Service: Gastroenterology;  Laterality: N/A;   ESOPHAGOGASTRODUODENOSCOPY (EGD) WITH PROPOFOL N/A 11/22/2021   Procedure: ESOPHAGOGASTRODUODENOSCOPY (EGD) WITH PROPOFOL;  Surgeon: [name], MD;  Location: [facility] ENDOSCOPY;  Service: Gastroenterology;  Laterality: N/A;   EYE SURGERY     FLEXIBLE SIGMOIDOSCOPY N/A 10/13/2018   Procedure: FLEXIBLE SIGMOIDOSCOPY;  Surgeon: [name], MD;  Location: [facility] ENDOSCOPY;  Service: Gastroenterology;  Laterality: N/A;   FLEXIBLE SIGMOIDOSCOPY N/A 09/09/2019   Procedure: FLEXIBLE SIGMOIDOSCOPY;  Surgeon: [name], MD;  Location: [facility] ENDOSCOPY;  Service: Gastroenterology;  Laterality: N/A;   FLEXIBLE SIGMOIDOSCOPY N/A 08/04/2020   Procedure: FLEXIBLE SIGMOIDOSCOPY;  Surgeon: [name], MD;  Location: [facility] ENDOSCOPY;  Service: Gastroenterology;  Laterality: N/A;  Per Dr. Vicente Males, unsedated   [place] N/A 08/31/2021   Procedure: FLEXIBLE SIGMOIDOSCOPY;  Surgeon: [name], MD;  Location: [facility] ENDOSCOPY;  Service: Gastroenterology;  Laterality: N/A;  no sedation   HIP ARTHROPLASTY Right 09/27/2020   Procedure: ARTHROPLASTY BIPOLAR HIP (HEMIARTHROPLASTY);  Surgeon: Hessie Knows, MD;  Location: [facility] ORS;  Service: Orthopedics;  Laterality: Right;   JOINT REPLACEMENT     ROTATOR CUFF REPAIR Bilateral    left-12/2009;  right-03/2009 dr.[email or web address]   TONSILLECTOMY     TOTAL SHOULDER REPLACEMENT Left     Social History Social History   Tobacco Use   Smoking status: Never   Smokeless tobacco: Never  Vaping Use   Vaping Use: Never used  Substance Use Topics   Alcohol use: No   Drug use: No    Family History Family History  Problem Relation Age of Onset   COPD Mother    Cancer Brother        colon    No Known Allergies   REVIEW OF SYSTEMS (Negative unless checked)  Constitutional: [] Weight loss  [] Fever  [] Chills Cardiac: [] Chest pain   [] Chest pressure   [] Palpitations   [] Shortness of breath when laying flat   [x] Shortness of breath at rest   [] Shortness of breath with exertion. Vascular:  [] Pain in legs with walking   [] Pain in legs at rest   [] Pain in legs when laying flat   [] Claudication   [] Pain in feet when walking  [] Pain  in feet at rest  [] Pain in feet when laying flat   [] History of DVT   [] Phlebitis   [] Swelling in legs   [] Varicose veins   [] Non-healing ulcers Pulmonary:   [x] Uses home oxygen   [] Productive cough   [] Hemoptysis   [] Wheeze  [] COPD   [] Asthma Neurologic:  [] Dizziness  [] Blackouts   [] Seizures   [] History of stroke   [] History of TIA  [] Aphasia   [] Temporary blindness   [] Dysphagia   [] Weakness or numbness in arms   [] Weakness or numbness in legs Musculoskeletal:  [] Arthritis   [] Joint swelling   [] Joint pain   [] Low back pain Hematologic:  [] Easy bruising  [] Easy bleeding   [] Hypercoagulable state   [] Anemic  [] Hepatitis Gastrointestinal:  [] Blood in stool   [] Vomiting blood  [] Gastroesophageal reflux/heartburn   [] Difficulty swallowing. Genitourinary:  [] Chronic kidney disease   [] Difficult urination  [] Frequent urination  [] Burning with urination   [] Blood in urine Skin:  [] Rashes   [] Ulcers   [] Wounds Psychological:  [] History of anxiety   []  History of major depression.  Physical Examination  Vitals:   05/07/22 1800 05/07/22 1900 05/07/22 2017 05/07/22 2031  BP:  (!) 102/57   (!) 111/56  Pulse: 72   77  Resp: (!) 32   (!) 25  Temp:      TempSrc:      SpO2: (!) 84% 95% 95% 93%  Weight:      Height:       Body mass index is 22.78 kg/m. Gen:  WD/WN, NAD Head: [place]/AT, No temporalis wasting. Prominent temp pulse not noted. Ear/Nose/Throat: Hearing grossly intact, nares w/o erythema or drainage, oropharynx w/o Erythema/Exudate Eyes: Sclera non-icteric, conjunctiva clear Neck: Trachea midline.  No JVD.  Pulmonary: Nonlabored at rest with high flow nasal cannula Cardiac: RRR, normal S1, S2. Vascular:  Vessel Right Left  Radial Palpable Palpable   Gastrointestinal: soft, non-tender/non-distended. No guarding/reflex.  Musculoskeletal: M/S 5/5 throughout.  Extremities without ischemic changes.  No deformity or atrophy. No edema. Neurologic: Sensation grossly intact in extremities.  Symmetrical.  Speech is fluent. Motor exam as listed above. Psychiatric: Judgment intact, Mood & affect appropriate for pt's clinical situation. Dermatologic: No rashes or ulcers noted.  No cellulitis or open wounds. Lymph : No Cervical, Axillary, or Inguinal lymphadenopathy.    CBC Lab Results  Component Value Date   WBC 10.5 05/07/2022   HGB 8.7 (L) 05/07/2022   HCT 27.5 (L) 05/07/2022   MCV 88.7 05/07/2022   PLT 373 05/07/2022    BMET    Component Value Date/Time   NA 135 05/07/2022 0438   NA 139 09/21/2020 1320   NA 139 02/09/2014 0500   K 3.7 05/07/2022 0438   K 3.2 (L) 02/09/2014 0500   CL 97 (L) 05/07/2022 0438   CL 106 02/09/2014 0500   CO2 30 05/07/2022 0438   CO2 27 02/09/2014 0500   GLUCOSE 123 (H) 05/07/2022 0438   GLUCOSE 85 02/09/2014 0500   BUN 46 (H) 05/07/2022 0438   BUN 19 09/21/2020 1320   BUN 11 02/09/2014 0500   CREATININE 1.24 (H) 05/07/2022 0438   CREATININE 0.78 02/09/2014 0500   CALCIUM 8.3 (L) 05/07/2022 0438   CALCIUM 8.8 02/09/2014 0500   GFRNONAA 43 (L) 05/07/2022 0438   GFRNONAA >60 02/09/2014 0500   GFRAA 76  09/21/2020 1320   GFRAA >60 02/09/2014 0500   Estimated Creatinine Clearance: 24 mL/min (A) (by C-G formula based on SCr of 1.24 mg/dL (H)).  COAG No results found for: "INR", "PROTIME"  Radiology DG Chest Port 1 View  Result Date: 05/07/2022 CLINICAL DATA:  Infiltrative lung present on imaging study. EXAM: PORTABLE CHEST 1 VIEW COMPARISON:  AP chest 05/04/2022 and 05/01/2021, CT chest 05/01/2022; chest two views 10/16/2021 FINDINGS: Cardiac silhouette is grossly at the upper limits of normal size. Mediastinal contours are not well evaluated given low lung volumes and frontal technique. Moderately decreased lung volumes are unchanged. Moderate bilateral interstitial thickening is similar to prior, noting chronic interstitial scarring on multiple prior studies. Mild patchy hazy opacities are similar to prior, likely superimposed atelectasis versus infection. No pleural effusion or pneumothorax. Mild dextrocurvature of the mid to lower thoracic spine. Multilevel degenerative disc and endplate changes. Kyphoplasty cement again overlies the T12 and L1 vertebral bodies. The right humeral head is again high-riding suggesting a superior right rotator cuff full-thickness tear. Partial visualization of reverse total left shoulder arthroplasty. IMPRESSION: No significant change in low lung volumes and moderate interstitial thickening with mild patchy acute airspace opacities. Findings are again consistent with chronic interstitial lung disease with superimposed mild atelectasis versus superimposed infection. Electronically Signed   By: Yvonne Kendall M.D.   On: 05/07/2022 08:27   DG Chest Port 1 View  Result Date: 05/04/2022 CLINICAL DATA:  Admitted due to dyspnea and palpitations. Reports a decline of several months with acute worsening the past few days. Reports palpitations with minimal movement present for several weeks. No chest pain. Reports having to increase baseline home o2 of 2-3 liters to 4 liters  lately, has had several months of hypoxia, and has worsened over past few weeks EXAM: PORTABLE CHEST 1 VIEW COMPARISON:  05/01/2022 and older studies. FINDINGS: Interstitial and hazy airspace lung opacities have mildly improved. Lung volumes remain low accentuating the lung opacities. No convincing pleural effusion and no pneumothorax. IMPRESSION: 1. Interval improvement. Mild decrease in the interstitial and hazy airspace lung opacities since the most recent prior study. No new abnormalities. Electronically Signed   By: Lajean Manes M.D.   On: 05/04/2022 09:27   ECHOCARDIOGRAM COMPLETE  Result Date: 05/01/2022    ECHOCARDIOGRAM REPORT   Patient Name:   KEYLEEN CERRATO Date of Exam: 05/01/2022 Medical Rec #:  [id number]         Height:       58.0 in Accession #:    [id number]        Weight:       109.0 lb Date of Birth:  [birth year]         BSA:          1.407 m Patient Age:    73 years          BP:           147/87 mmHg Patient Gender: F                 HR:           96 bpm. Exam Location:  [facility] Procedure: 2D Echo, Cardiac Doppler and Color Doppler Indications:     R06.00 Dyspnea  History:         Patient has prior history of Echocardiogram examinations, most                  recent 05/29/2020. Signs/Symptoms:Shortness of Breath, Chest                  Pain and Fatigue; Risk Factors:Hypertension, Dyslipidemia and  Sleep Apnea. COVID-19.  Sonographer:     Cresenciano Lick RDCS Referring Phys:  Lambert IRWE Diagnosing Phys: Yolonda Kida MD IMPRESSIONS  1. Left ventricular ejection fraction, by estimation, is 55 to 60%. The left ventricle has normal function. The left ventricle has no regional wall motion abnormalities. Left ventricular diastolic parameters are consistent with Grade I diastolic dysfunction (impaired relaxation).  2. Right ventricular systolic function is normal. The right ventricular size is normal.  3. Left atrial size was mildly dilated.  4. The mitral valve  is grossly normal. Mild mitral valve regurgitation.  5. The aortic valve is grossly normal. Aortic valve regurgitation is mild. FINDINGS  Left Ventricle: Left ventricular ejection fraction, by estimation, is 55 to 60%. The left ventricle has normal function. The left ventricle has no regional wall motion abnormalities. The left ventricular internal cavity size was normal in size. There is  borderline left ventricular hypertrophy. Left ventricular diastolic parameters are consistent with Grade I diastolic dysfunction (impaired relaxation). Right Ventricle: The right ventricular size is normal. No increase in right ventricular wall thickness. Right ventricular systolic function is normal. Left Atrium: Left atrial size was mildly dilated. Right Atrium: Right atrial size was normal in size. Pericardium: There is no evidence of pericardial effusion. Mitral Valve: The mitral valve is grossly normal. Mild mitral valve regurgitation. Tricuspid Valve: The tricuspid valve is normal in structure. Tricuspid valve regurgitation is mild. Aortic Valve: The aortic valve is grossly normal. Aortic valve regurgitation is mild. Aortic valve mean gradient measures 4.6 mmHg. Aortic valve peak gradient measures 8.3 mmHg. Pulmonic Valve: The pulmonic valve was normal in structure. Pulmonic valve regurgitation is not visualized. Aorta: The ascending aorta was not well visualized. IAS/Shunts: No atrial level shunt detected by color flow Doppler.  LEFT VENTRICLE PLAX 2D LVIDd:         2.90 cm Diastology LVIDs:         2.00 cm LV e' medial:    7.18 cm/s LV PW:         0.80 cm LV E/e' medial:  7.4 LV IVS:        0.80 cm LV e' lateral:   5.87 cm/s                        LV E/e' lateral: 9.0  RIGHT VENTRICLE RV Basal diam:  3.40 cm RV S prime:     7.69 cm/s TAPSE (M-mode): 0.7 cm LEFT ATRIUM             Index        RIGHT ATRIUM          Index LA diam:        3.60 cm 2.56 cm/m   RA Area:     5.07 cm LA Vol (A2C):   36.1 ml 25.66 ml/m  RA  Volume:   8.09 ml  5.75 ml/m LA Vol (A4C):   25.4 ml 18.05 ml/m LA Biplane Vol: 33.0 ml 23.45 ml/m  AORTIC VALVE AV Vmax:           143.80 cm/s AV Vmean:          103.560 cm/s AV VTI:            0.232 m AV Peak Grad:      8.3 mmHg AV Mean Grad:      4.6 mmHg LVOT Vmax:         84.25 cm/s LVOT Vmean:  59.000 cm/s LVOT VTI:          0.122 m LVOT/AV VTI ratio: 0.52  AORTA Ao Root diam: 2.50 cm Ao Asc diam:  3.20 cm MITRAL VALVE               TRICUSPID VALVE MV Area (PHT): 3.65 cm    TR Peak grad:   37.5 mmHg MV Decel Time: 208 msec    TR Vmax:        306.00 cm/s MV E velocity: 52.90 cm/s MV A velocity: 87.30 cm/s  SHUNTS MV E/A ratio:  0.61        Systemic VTI: 0.12 m Yolonda Kida MD Electronically signed by Yolonda Kida MD Signature Date/Time: 05/01/2022/10:46:11 PM    Final    US Venous Img Lower Bilateral (DVT)  Result Date: 05/01/2022 CLINICAL DATA:  PE EXAM: Bilateral LOWER EXTREMITY VENOUS DOPPLER ULTRASOUND TECHNIQUE: Gray-scale sonography with compression, as well as color and duplex ultrasound, were performed to evaluate the deep venous system(s) from the level of the common femoral vein through the popliteal and proximal calf veins. COMPARISON:  Chest CT 05/01/2022 FINDINGS: VENOUS Normal compressibility of the common femoral, superficial femoral, and popliteal veins, as well as the visualized calf veins. Visualized portions of profunda femoral vein and great saphenous vein unremarkable. No filling defects to suggest DVT on grayscale or color Doppler imaging. Doppler waveforms show normal direction of venous flow, normal respiratory plasticity and response to augmentation. OTHER None. Limitations: none IMPRESSION: Negative. Electronically Signed   By: Donavan Foil M.D.   On: 05/01/2022 20:51   DG Chest Portable 1 View  Result Date: 05/01/2022 CLINICAL DATA:  Shortness of breath. EXAM: PORTABLE CHEST 1 VIEW COMPARISON:  May 01, 2022 FINDINGS: The heart size and mediastinal  contours are within normal limits. Low lung volumes are noted. Marked severity diffuse bilateral interstitial opacities are again seen. Underlying bibasilar pulmonary fibrotic changes are again noted. There is stable elevation of the left hemidiaphragm. There is no evidence of a pleural effusion or pneumothorax. A left shoulder prosthesis is seen. IMPRESSION: 1. Marked severity diffuse bilateral interstitial opacities which are, in part, chronic in nature. A superimposed infectious component cannot be excluded. Electronically Signed   By: Virgina Norfolk M.D.   On: 05/01/2022 20:07   CT Angio Chest Pulmonary Embolism (PE) W or WO Contrast  Result Date: 05/01/2022 CLINICAL DATA:  Insert EXAM: CT ANGIOGRAPHY CHEST WITH CONTRAST TECHNIQUE: Multidetector CT imaging of the chest was performed using the standard protocol during bolus administration of intravenous contrast. Multiplanar CT image reconstructions and MIPs were obtained to evaluate the vascular anatomy. RADIATION DOSE REDUCTION: This exam was performed according to the departmental dose-optimization program which includes automated exposure control, adjustment of the mA and/or kV according to patient size and/or use of iterative reconstruction technique. CONTRAST:  65m OMNIPAQUE IOHEXOL 350 MG/ML SOLN COMPARISON:  CT 10/19/2021 FINDINGS: Cardiovascular: Filling defect within the proximal LEFT lingular pulmonary artery (image 30/4). This defect is also seen on coronal image 30/7. This defect is partially occlusive. Potential additional second proximal LEFT lobe pulmonary artery filling defect on image 33/4. This is also partially occlusive. Mediastinum/Nodes: Enlarged RIGHT lower paratracheal node measuring 14 mm (image 22/4) is new from prior. Lungs/Pleura: There is severe ground-glass densities throughout the lungs superimposed on chronic interstitial lung disease. No pneumothorax. No pleural fluid. No infarction or pneumonia. Upper Abdomen: Limited  view of the liver, kidneys, pancreas are unremarkable. Normal adrenal glands. Musculoskeletal: No aggressive  osseous lesion. Review of the MIP images confirms the above findings. IMPRESSION: 1. Acute pulmonary emboli within the proximal lingular pulmonary artery and LEFT lobe pulmonary artery. These acute pulmonary emboli are partially occlusive. Overall clot burden is mild. 2. Extensive diffuse ground-glass densities superimposed on interstitial lung disease. Favor pulmonary edema pattern versus atypical pneumonia. 3. Enlarged RIGHT lower paratracheal node is favored reactive. Critical Value/emergent results were called by telephone at the time of interpretation on 05/01/2022 at 5:02 pm to provider [facility] , who verbally acknowledged these results. Electronically Signed   By: Suzy Bouchard M.D.   On: 05/01/2022 17:03   CT ABDOMEN PELVIS W CONTRAST  Result Date: 05/01/2022 CLINICAL DATA:  Shortness of breath. Sharp left lower quadrant pain. History of pulmonary fibrosis. EXAM: CT ABDOMEN AND PELVIS WITH CONTRAST TECHNIQUE: Multidetector CT imaging of the abdomen and pelvis was performed using the standard protocol following bolus administration of intravenous contrast. RADIATION DOSE REDUCTION: This exam was performed according to the departmental dose-optimization program which includes automated exposure control, adjustment of the mA and/or kV according to patient size and/or use of iterative reconstruction technique. CONTRAST:  68m OMNIPAQUE IOHEXOL 300 MG/ML  SOLN COMPARISON:  01/17/2022 CT.  02/14/2022 MRI. FINDINGS: Lower chest: Pulmonary fibrotic pattern with bronchiectasis. Pulmonary parenchymal opacity appears worsened compared to prior study and there could be a degree of edema or infectious pneumonia. Hepatobiliary: Liver parenchyma is normal. Previous cholecystectomy. Pancreas: Normal Spleen: Normal Adrenals/Urinary Tract: Adrenal glands are normal. Kidneys are normal except for an 8 mm stone in  the upper pole the right kidney without obstruction. Bladder is normal. Stomach/Bowel: Stomach and small intestine are normal. Redemonstration of a diffuse colitis pattern, particularly in the left colon, as was seen at previous imaging. The patient has diverticulosis but there is no convincing diverticulitis. Low level diverticulitis can be inapparent at imaging. Vascular/Lymphatic: Aortic atherosclerosis. No aneurysm. IVC is normal. No adenopathy. Reproductive: No pelvic mass. Other: No free fluid or air. Musculoskeletal: Chronic degenerative change and curvature of the spine. Old augmented fractures at T12, L1 and L4. Old healed superior endplate fracture at L3. IMPRESSION: Redemonstration of diffuse colitis pattern, particularly affecting the left colon, similar to the prior imaging of May and June of this year. Infectious or inflammatory colitis is favored. The patient does have diverticulosis but I do not see evidence of acute diverticulitis. Low level diverticulitis can be inapparent at imaging. Findings of chronic pulmonary fibrosis. Increased pulmonary parenchymal density which could be seen in the setting of edema or infectious inflammation. Nonobstructing 8 mm stone at the upper pole of the right kidney. Electronically Signed   By: MNelson ChimesM.D.   On: 05/01/2022 11:46   DG Chest Portable 1 View  Result Date: 05/01/2022 CLINICAL DATA:  Shortness of breath, history of pulmonary fibrosis EXAM: PORTABLE CHEST 1 VIEW COMPARISON:  10/16/2021 FINDINGS: The heart size and mediastinal contours are within normal limits. Diffuse bilateral interstitial opacity, superimposed upon chronic, bibasilar fibrotic change. Chronic elevation of the left hemidiaphragm. Status post left shoulder reverse arthroplasty. The visualized skeletal structures are unremarkable. IMPRESSION: Diffuse bilateral interstitial opacity, superimposed upon chronic, bibasilar fibrotic change. Findings may reflect edema or infection. No  focal airspace opacity. Electronically Signed   By: ADelanna AhmadiM.D.   On: 05/01/2022 08:10      Assessment/Plan 1.  Pulmonary embolism  While the patient's pulmonary embolism is not significant in size a pulmonary thrombectomy may help to improve her already tenuous respiratory situation.  However  because the patient is not able to tolerate anticoagulation will have an IVC filter placed.  I have discussed the risk benefits alternatives with the patient she is agreeable to proceed.  Family Communication:  Thank you for allowing [place] to participate in the care of this patient.   Kris Hartmann, NP [facility] [phone] (Office Phone) [phone] (Office Fax) [phone] (Pager)  05/07/2022 10:03 PM  Staff may message me via secure chat in [place]  but this may not receive immediate response,  please page for urgent matters!  Dictation software was used to generate the above note. Typos may occur and escape review, as with typed/written notes. Any error is purely unintentional.  Please contact me directly for clarity if needed.

## 2022-05-07 NOTE — Progress Notes (Signed)
PULMONOLOGY         Date: 05/07/2022,   MRN# [id number] CHIYEKO KEMPA 1935-09-24     AdmissionWeight: 49.4 kg                 CurrentWeight: 49.4 kg  Referring provider: [name]   CHIEF COMPLAINT:   Acute on chronic hypoxemic respiratory failure   HISTORY OF PRESENT ILLNESS   This is a pleasant 86 yo F with hx of UC, ILD with Pulmonary fibrosis, chronic pain syndrome, who came in due to acute on chronic hypoxemia. She reports increased O2 requirement at home with worsening cough, she denies flu like illness or fevers.  Labwork reveals elevated BNP   05/02/22 - patient is improved clinically, RVP is negative , does not appear to have ongoing infection s/p ID eval with de-escalation of antimicrobials. Placed on eliquis for acute PE with mild clot burden.  CRP and ESR was not collected and I have reordered this. She is nontachypneic during exam speaking in full sentences. Mild hypokalemia on labs has been repleted and repleted magnesium as well.    05/04/22- Patient is a bit uncomfortable this am. She reports worsening diarreah and abd discomfort.  She had 5 loose stools overnight. She has not been able to wean down much from yesterday on O2. We repleted her K but due to GI losses she has hypokalemia still.  Will obtain pharmacy consult to help with ongong electrolyte shift.  Reduced steroids to 60IV once daily, have added TID scheduled tussinex 2.51mL to help with diarreah since we think its non-infectious.  CXR done and reviewed this AM , there are diffuse bilateral infiltrates but improved from rior.   05/07/22- patient had several bloody episodes of loose stools. She thinks today this is slightly better. Her inflammatory biomarkers are still elevated. Her overall presentation seems like a UC patient with COVID19 infection, I wonder if she has an undetectable strain with current testing platform.  O2 requirement is unchanged from 2 days ago.  Im considering vascular surgery  consult for thromectomy evaluation since we have not had much more improvemetn over past 48h and unable to use blood thinners any longer due to onging GI bleeds with UC. Fungitell is still inprocess , remainder of infectiuos workup is negative. I have reduced steroids today. To 40mg  solumedrol  PAST MEDICAL HISTORY   Past Medical History:  Diagnosis Date   Altered mental state    Anginal pain (HCC)    Anxiety    Basal cell carcinoma 05/03/2021   right neck postauricular - Excised 06/12/21   BMI 30.0-30.9,adult    Cellulitis    Chest pain, atypical    Compression fracture of L4 lumbar vertebra    Constipation    COVID-19 virus infection 05/28/2020   Cystitis    Cystocele    Diverticulosis    Fatigue    Fibrosis, idiopathic pulmonary (HCC)    Gastroenteritis    History of back surgery    History of herniated intervertebral disc    HTN (hypertension)    Hypercholesteremia    Hypocalcemia    Incontinence of urine    OA (osteoarthritis)    shoulder and back   Obesity    Osteopenia    Pneumonia    Shortness of breath    Sleep apnea    Squamous cell carcinoma of skin 07/16/2016   Right upper lateral eyebrow. SCCis.   Squamous cell carcinoma of skin 01/13/2018   Left temporal  hairline. WD SCC with superficial infiltration.   Thrush    Vitamin D deficiency      SURGICAL HISTORY   Past Surgical History:  Procedure Laterality Date   BACK SURGERY     CATARACT EXTRACTION W/PHACO Left 04/24/2016   Procedure: CATARACT EXTRACTION PHACO AND INTRAOCULAR LENS PLACEMENT (IOC);  Surgeon: Lockie Mola, MD;  Location: [facility];  Service: Ophthalmology;  Laterality: Left;   CATARACT EXTRACTION W/PHACO Right 07/14/2017   Procedure: CATARACT EXTRACTION PHACO AND INTRAOCULAR LENS PLACEMENT (IOC)  RIGHT;  Surgeon: Lockie Mola, MD;  Location: [facility];  Service: Ophthalmology;  Laterality: Right;   CHOLECYSTECTOMY     COLONOSCOPY WITH PROPOFOL N/A  02/01/2022   Procedure: COLONOSCOPY WITH PROPOFOL;  Surgeon: Wyline Mood, MD;  Location: [facility] ENDOSCOPY;  Service: Gastroenterology;  Laterality: N/A;   CYSTOSCOPY/URETEROSCOPY/HOLMIUM LASER/STENT PLACEMENT Left 04/27/2021   Procedure: CYSTOSCOPY/URETEROSCOPY/HOLMIUM LASER/STENT PLACEMENT;  Surgeon: Sondra Come, MD;  Location: [facility] ORS;  Service: Urology;  Laterality: Left;   ESOPHAGOGASTRODUODENOSCOPY (EGD) WITH PROPOFOL N/A 10/13/2018   Procedure: ESOPHAGOGASTRODUODENOSCOPY (EGD) WITH PROPOFOL;  Surgeon: Wyline Mood, MD;  Location: [facility] ENDOSCOPY;  Service: Gastroenterology;  Laterality: N/A;   ESOPHAGOGASTRODUODENOSCOPY (EGD) WITH PROPOFOL N/A 11/22/2021   Procedure: ESOPHAGOGASTRODUODENOSCOPY (EGD) WITH PROPOFOL;  Surgeon: Wyline Mood, MD;  Location: [facility] ENDOSCOPY;  Service: Gastroenterology;  Laterality: N/A;   EYE SURGERY     FLEXIBLE SIGMOIDOSCOPY N/A 10/13/2018   Procedure: FLEXIBLE SIGMOIDOSCOPY;  Surgeon: Wyline Mood, MD;  Location: [facility] ENDOSCOPY;  Service: Gastroenterology;  Laterality: N/A;   FLEXIBLE SIGMOIDOSCOPY N/A 09/09/2019   Procedure: FLEXIBLE SIGMOIDOSCOPY;  Surgeon: Wyline Mood, MD;  Location: [facility] ENDOSCOPY;  Service: Gastroenterology;  Laterality: N/A;   FLEXIBLE SIGMOIDOSCOPY N/A 08/04/2020   Procedure: FLEXIBLE SIGMOIDOSCOPY;  Surgeon: Wyline Mood, MD;  Location: [facility] ENDOSCOPY;  Service: Gastroenterology;  Laterality: N/A;  Per Dr. Tobi Bastos, unsedated   FLEXIBLE SIGMOIDOSCOPY N/A 08/31/2021   Procedure: FLEXIBLE SIGMOIDOSCOPY;  Surgeon: Wyline Mood, MD;  Location: [facility] ENDOSCOPY;  Service: Gastroenterology;  Laterality: N/A;  no sedation   HIP ARTHROPLASTY Right 09/27/2020   Procedure: ARTHROPLASTY BIPOLAR HIP (HEMIARTHROPLASTY);  Surgeon: Kennedy Bucker, MD;  Location: [facility] ORS;  Service: Orthopedics;  Laterality: Right;   JOINT REPLACEMENT     ROTATOR CUFF REPAIR Bilateral    left-12/2009; right-03/2009 dr.califf   TONSILLECTOMY     TOTAL SHOULDER REPLACEMENT Left       FAMILY HISTORY   Family History  Problem Relation Age of Onset   COPD Mother    Cancer Brother        colon     SOCIAL HISTORY   Social History   Tobacco Use   Smoking status: Never   Smokeless tobacco: Never  Vaping Use   Vaping Use: Never used  Substance Use Topics   Alcohol use: No   Drug use: No     MEDICATIONS    Home Medication:     Current Medication:  Current Facility-Administered Medications:    0.9 %  sodium chloride infusion, 250 mL, Intravenous, PRN, Wouk, Wilfred Curtis, MD   acidophilus (RISAQUAD) capsule 2 capsule, 2 capsule, Oral, TID, Lowella Bandy, RPH, 2 capsule at 05/06/22 2044   albuterol (PROVENTIL) (2.5 MG/3ML) 0.083% nebulizer solution 2.5 mg, 2.5 mg, Nebulization, TID, Darlin Priestly, MD, 2.5 mg at 05/06/22 1838   [COMPLETED] amiodarone (NEXTERONE) 1.8 mg/mL load via infusion 150 mg, 150 mg, Intravenous, Once, 150 mg at 05/06/22 2236 **FOLLOWED BY** [EXPIRED] amiodarone (NEXTERONE PREMIX) 360-4.14 MG/200ML-% (1.8 mg/mL) IV infusion,  60 mg/hr, Intravenous, Continuous, Last Rate: 33.3 mL/hr at 05/07/22 0137, 60 mg/hr at 05/07/22 0137 **FOLLOWED BY** amiodarone (NEXTERONE PREMIX) 360-4.14 MG/200ML-% (1.8 mg/mL) IV infusion, 30 mg/hr, Intravenous, Continuous, Foust, Katy L, NP, Last Rate: 16.67 mL/hr at 05/07/22 0515, 30 mg/hr at 05/07/22 0515   apixaban (ELIQUIS) tablet 10 mg, 10 mg, Oral, BID, 10 mg at 05/06/22 2221 **FOLLOWED BY** [START ON 05/13/2022] apixaban (ELIQUIS) tablet 5 mg, 5 mg, Oral, BID, Darlin Priestly, MD   Chlorhexidine Gluconate Cloth 2 % PADS 6 each, 6 each, Topical, Daily, Darlin Priestly, MD, 6 each at 05/05/22 4098   citalopram (CELEXA) tablet 10 mg, 10 mg, Oral, Daily, Darlin Priestly, MD   feeding supplement (ENSURE ENLIVE / ENSURE PLUS) liquid 237 mL, 237 mL, Oral, BID BM, Darlin Priestly, MD, 237 mL at 05/06/22 1409   methylPREDNISolone sodium succinate (SOLU-MEDROL) 40 mg/mL injection 40 mg, 40 mg, Intravenous, Q24H, Banyan Goodchild, MD   sodium  chloride flush (NS) 0.9 % injection 3 mL, 3 mL, Intravenous, Q12H, Wouk, Wilfred Curtis, MD, 3 mL at 05/06/22 2048   sodium chloride flush (NS) 0.9 % injection 3 mL, 3 mL, Intravenous, PRN, Wouk, Wilfred Curtis, MD   Melene Muller ON 05/08/2022] sulfamethoxazole-trimethoprim (BACTRIM) 400-80 MG per tablet 1 tablet, 1 tablet, Oral, Once per day on Mon Wed Fri, Darlin Priestly, MD    ALLERGIES   Patient has no known allergies.     REVIEW OF SYSTEMS    Review of Systems:  Gen:  Denies  fever, sweats, chills weigh loss  HEENT: Denies blurred vision, double vision, ear pain, eye pain, hearing loss, nose bleeds, sore throat Cardiac:  No dizziness, chest pain or heaviness, chest tightness,edema Resp:   reports dyspnea chronically  Gi: Denies swallowing difficulty, stomach pain, nausea or vomiting, diarrhea, constipation, bowel incontinence Gu:  Denies bladder incontinence, burning urine Ext:   Denies Joint pain, stiffness or swelling Skin: Denies  skin rash, easy bruising or bleeding or hives Endoc:  Denies polyuria, polydipsia , polyphagia or weight change Psych:   Denies depression, insomnia or hallucinations   Other:  All other systems negative   VS: BP 104/61   Pulse 68   Temp 97.7 F (36.5 C)   Resp 18   Ht 4\' 10"  (1.473 m)   Wt 49.4 kg   SpO2 95%   BMI 22.78 kg/m      PHYSICAL EXAM    GENERAL:NAD, no fevers, chills, no weakness no fatigue HEAD: Normocephalic, atraumatic.  EYES: Pupils equal, round, reactive to light. Extraocular muscles intact. No scleral icterus.  MOUTH: Moist mucosal membrane. Dentition intact. No abscess noted.  EAR, NOSE, THROAT: Clear without exudates. No external lesions.  NECK: Supple. No thyromegaly. No nodules. No JVD.  PULMONARY: decreased breath sounds with mild rhonchi worse at bases bilaterally.  CARDIOVASCULAR: S1 and S2. Regular rate and rhythm. No murmurs, rubs, or gallops. No edema. Pedal pulses 2+ bilaterally.  GASTROINTESTINAL: Soft, nontender,  nondistended. No masses. Positive bowel sounds. No hepatosplenomegaly.  MUSCULOSKELETAL: No swelling, clubbing, or edema. Range of motion full in all extremities.  NEUROLOGIC: Cranial nerves II through XII are intact. No gross focal neurological deficits. Sensation intact. Reflexes intact.  SKIN: No ulceration, lesions, rashes, or cyanosis. Skin warm and dry. Turgor intact.  PSYCHIATRIC: Mood, affect within normal limits. The patient is awake, alert and oriented x 3. Insight, judgment intact.       IMAGING     ASSESSMENT/PLAN   Acute on chronic hypoxemic respiratory failure    -  abnormal CT chest with interval development of inflammatory changes as evidenced with serial CT chest above   - possible overlying interstitial edema   - RVP is negative and remainder of micro is negative   - ESR/CRP trending  -currently on solumedrol 60mg  IV  - the situation is very difficult because she benefits from steroids with ulcerative colitis and ILD but it potentiates infection including Cdiff so therapy is further complicated   - stopping diuresis due to GI losses   - colitis seems to be mostly inflammatory , appreciate ID evaluation     Pulmonary fibrosis - chronic   - on esbriet - please continue as current  -PT /OT as able             Thank you for allowing me to participate in the care of this patient.   Patient/Family are satisfied with care plan and all questions have been answered.    Provider disclosure: Patient with at least one acute or chronic illness or injury that poses a threat to life or bodily function and is being managed actively during this encounter.  All of the below services have been performed independently by signing provider:  review of prior documentation from internal and or external health records.  Review of previous and current lab results.  Interview and comprehensive assessment during patient visit today. Review of current and previous chest  radiographs/CT scans. Discussion of management and test interpretation with health care team and patient/family.   This document was prepared using Dragon voice recognition software and may include unintentional dictation errors.     Vida Rigger, M.D.  Division of Pulmonary & Critical Care Medicine

## 2022-05-07 NOTE — Progress Notes (Signed)
Pt converted from NSR to A fib, with HR sustaining in the 150s. NP Foust notified by RN of pt's heart rate/heart rhythm. NP ordered EKG, lab work, and patient placed on Amiodarone drip. Pt converted back to NSR after Amiodarone bolus was given. NP ordered another EKG. Pt remained on Amiodarone drip per order.

## 2022-05-07 NOTE — Progress Notes (Addendum)
PROGRESS NOTE    [name]  QQV:[id number] DOB: 01-Jul-1936 DOA: 05/01/2022 PCP: [name], NP  [id number]  LOS: 6 days   Brief hospital course: No notes on file  Assessment & Plan: [name] is a 86 y.o. female with medical history significant for ILD, recurrent c diff, ulcerative colitis, chronic pain, chronic hypoxic respiratory failure, who presented with dyspnea and palpitations.   Reports a decline of several months with acute worsening the days PTA.  Reports having to increase baseline home o2 of 2-3 liters to 4 liters lately, has had several months of hypoxia, and has worsened over past few weeks to being dyspneic at rest worse with minimal ambulation.   # Acute on chronic hypoxemic respiratory failure --recently, pt has been on 4-5L home O2.  Pt was put on NRB in the ED with Sats of 94%.  Currently needing 11-12L. --covid and RVP neg.  likely progressive ILD, pulm edema, and small PE contributing.  Questionable PNA.  Started on tx for all 4 etiologies on admission. --Continue supplemental O2 to keep sats between 88-92%, wean as tolerated  # ILD Underlying severe ILD that is likely progressing but at least several days of acutely worsening dyspnea.  --started on prednisone 50 mg daily on admission and escalated up to IV solumedrol. --pulm consulted, Dr. Lanney Gins Plan: --cont IV solumedrol 40 mg daily, per pulm --Albuterol neb scheduled, per pulm  # Bloody Diarrhea, worsening --started having more frequent diarrhea starting night of 9/1.  C diff neg on presentation.  Likely due to abx pt received during the first 2 days. --GI consulted --diarrhea noted to be bloody on 9/3 Plan: --d/c Eliquis --GI to plan possible scope  Acute blood loss anemia --Hgb dropped from 12.5 to 8.7 in 3 days from bloody diarrhea --Monitor Hgb and transfuse to keep Hgb >8  # Pulmonary embolus, acute Small on CT, doubt it's the main driver for her dyspnea.  Started on  Eliquis Plan: --d/c Eliquis due to bloody diarrhea --vascular for thrombectomy and IVC filter tomorrow  AKI --Cr went up to 1.68, from baseline of 0.7 --likely 2/2 to dehydration from diarrhea --improved with IVF --cont NS@50  for 20 hours  Afib w RVR --started night of 9/4.  Started on amiodarone gtt due to hypotension Plan: --cont amiodarone gtt   # Acute on chronic diastolic CHF --pulm edema seen on CT, elevated BNP --started on IV lasix 40 mg daily, d/c'ed on 9/2 Plan: --hold further diuretics due to diarrhea  # CAP, ruled out --started on cefepime on admission --ID consulted, and ruled out PNA, abx d/c'ed after 2 days.  # History recurrent C diff --has multiple BM's per day, sometimes loose, but pt said that's her baseline --C diff neg on presentation  # Chronic pain - home norco prn   # UC On entyvio ever 2 weeks at home --GI consulted  Hypokalemia --likely due to diarrhea --monitor and replete PRN   DVT prophylaxis: SCD/Compression stockings Code Status: Full code  Family Communication: daughter and husband updated at bedside today Level of care: Progressive Dispo:   The patient is from: home Anticipated d/c is to: home Anticipated d/c date is: undetermined   Subjective and Interval History:  Overnight, pt developed Afib w RVR and due to hypotension, was started on amiodarone gtt.  Pt continued to have bloody diarrhea, Hgb dropped from 12.5 to 8.7 this morning in 3 days.  Eliquis stopped.   Objective: Vitals:   05/07/22 1600 05/07/22  1700 05/07/22 1800 05/07/22 1900  BP: (!) 104/59 116/84 (!) 102/57   Pulse: 77 78 72   Resp: (!) 31 (!) 27 (!) 32   Temp:      TempSrc:      SpO2: 97% 97% (!) 84% 95%  Weight:      Height:        Intake/Output Summary (Last 24 hours) at 05/07/2022 1916 Last data filed at 05/07/2022 1413 Gross per 24 hour  Intake 584.52 ml  Output 450 ml  Net 134.52 ml    Filed Weights   05/01/22 0753  Weight: 49.4 kg     Examination:   Constitutional: NAD, AAOx3 HEENT: conjunctivae and lids normal, EOMI CV: No cyanosis.   RESP: normal respiratory effort, desating on 10L Extremities: No effusions, edema in BLE SKIN: warm, dry Neuro: II - XII grossly intact.   Psych: Normal mood and affect.  Appropriate judgement and reason   Data Reviewed: I have personally reviewed labs and imaging studies  Time spent: 70 minutes  Enzo Bi, MD [facility] If 7PM-7AM, please contact night-coverage 05/07/2022, 7:16 PM

## 2022-05-07 NOTE — Progress Notes (Signed)
PT Cancellation Note  Patient Details Name: [name] MRN: [id number] DOB: [birth year]   Cancelled Treatment:    Reason Eval/Treat Not Completed: Fatigue/lethargy limiting ability to participate  Offered and encouraged session.  Stated she was feeling poorly and generally more weak today but did not say anything specific.  Appears comfortable at rest but declines interventions at this time.   Chesley Noon 05/07/2022, 2:47 PM

## 2022-05-07 NOTE — Care Management Important Message (Signed)
Important Message  Patient Details  Name: [name] MRN: [id number] Date of Birth: Oct 31, 1935   Medicare Important Message Given:  Yes     Dannette Barbara 05/07/2022, 11:59 AM

## 2022-05-07 NOTE — Consult Note (Incomplete)
[facility] Consult Note  MRN : [id number]  [name] is a 86 y.o. (1935-12-31) female who presents with chief complaint of  Chief Complaint  Patient presents with  . Shortness of Breath  . Abdominal Pain  . Weakness  .   Consulting Physician: Wallis Bamberg, MD Reason for consult: Pulmonary embolism History of Present Illness: [name] is a 86 year old female who presented to [name] due to acute on chronic hypoxemia.  The patient is currently on home O2.  She had an increased oxygen requirement with worsening cough.  She has a previous medical history of ulcerative colitis pulmonary fibrosis, chronic pain syndrome.  In the midst of work-up it was found that the patient had a noted pulmonary embolism.  Due to the patient's history of ulcerative colitis, she is unable to be anticoagulated.  Current Facility-Administered Medications  Medication Dose Route Frequency Provider Last Rate Last Admin  . 0.9 %  sodium chloride infusion  250 mL Intravenous PRN Wouk, Ailene Rud, MD      . 0.9 %  sodium chloride infusion   Intravenous Continuous Enzo Bi, MD 50 mL/hr at 05/07/22 1802 New Bag at 05/07/22 1802  . acidophilus (RISAQUAD) capsule 2 capsule  2 capsule Oral TID Dallie Piles, RPH   2 capsule at 05/07/22 1702  . albuterol (PROVENTIL) (2.5 MG/3ML) 0.083% nebulizer solution 2.5 mg  2.5 mg Nebulization TID Enzo Bi, MD   2.5 mg at 05/07/22 2017  . amiodarone (NEXTERONE PREMIX) 360-4.14 MG/200ML-% (1.8 mg/mL) IV infusion  30 mg/hr Intravenous Continuous Foust, Katy L, NP 16.67 mL/hr at 05/07/22 0928 30 mg/hr at 05/07/22 0928  . Chlorhexidine Gluconate Cloth 2 % PADS 6 each  6 each Topical Daily Enzo Bi, MD   6 each at 05/05/22 [phone]  . citalopram (CELEXA) tablet 10 mg  10 mg Oral Daily Enzo Bi, MD   10 mg at 05/07/22 0938  . feeding supplement (ENSURE ENLIVE / ENSURE PLUS) liquid 237 mL  237 mL Oral BID BM Enzo Bi, MD   237 mL at 05/07/22 1450  . methylPREDNISolone sodium succinate (SOLU-MEDROL) 40 mg/mL injection 40 mg  40 mg Intravenous Q24H Ottie Glazier, MD   40 mg at 05/07/22 1226  . sodium chloride flush (NS) 0.9 % injection 3 mL  3 mL Intravenous Q12H Wouk, Ailene Rud, MD   3 mL at 05/07/22 0916  . sodium chloride flush (NS) 0.9 % injection 3 mL  3 mL Intravenous PRN Wouk, Ailene Rud, MD        Past Medical History:  Diagnosis Date  . Altered mental state   . Anginal pain ([place])   . Anxiety   . Basal cell carcinoma 05/03/2021   right neck postauricular - Excised 06/12/21  . BMI 30.0-30.9,adult   . Cellulitis   . Chest pain, atypical   . Compression fracture of L4 lumbar vertebra   . Constipation   . COVID-19 virus infection 05/28/2020  . Cystitis   . Cystocele   . Diverticulosis   . Fatigue   . Fibrosis, idiopathic pulmonary ([place])   . Gastroenteritis   . History of back surgery   . History of herniated intervertebral disc   . HTN (hypertension)   . Hypercholesteremia   . Hypocalcemia   . Incontinence of urine   . OA (osteoarthritis)    shoulder and back  . Obesity   . Osteopenia   . Pneumonia   . Shortness  of breath   . Sleep apnea   . Squamous cell carcinoma of skin 07/16/2016   Right upper lateral eyebrow. SCCis.  . Squamous cell carcinoma of skin 01/13/2018   Left temporal hairline. WD SCC with superficial infiltration.  Ritta Slot   . Vitamin D deficiency     Past Surgical History:  Procedure Laterality Date  . BACK SURGERY    . CATARACT EXTRACTION W/PHACO Left 04/24/2016   Procedure: CATARACT EXTRACTION PHACO AND INTRAOCULAR LENS PLACEMENT (IOC);  Surgeon: Leandrew Koyanagi, MD;  Location: [place];  Service: Ophthalmology;  Laterality: Left;  . CATARACT EXTRACTION W/PHACO Right 07/14/2017   Procedure: CATARACT EXTRACTION PHACO AND INTRAOCULAR LENS PLACEMENT ([place])  RIGHT;  Surgeon: Leandrew Koyanagi, MD;  Location: [place];   Service: Ophthalmology;  Laterality: Right;  . CHOLECYSTECTOMY    . COLONOSCOPY WITH PROPOFOL N/A 02/01/2022   Procedure: COLONOSCOPY WITH PROPOFOL;  Surgeon: [name], MD;  Location: [facility] ENDOSCOPY;  Service: Gastroenterology;  Laterality: N/A;  . CYSTOSCOPY/URETEROSCOPY/HOLMIUM LASER/STENT PLACEMENT Left 04/27/2021   Procedure: CYSTOSCOPY/URETEROSCOPY/HOLMIUM LASER/STENT PLACEMENT;  Surgeon: Billey Co, MD;  Location: [facility] ORS;  Service: Urology;  Laterality: Left;  . ESOPHAGOGASTRODUODENOSCOPY (EGD) WITH PROPOFOL N/A 10/13/2018   Procedure: ESOPHAGOGASTRODUODENOSCOPY (EGD) WITH PROPOFOL;  Surgeon: [name], MD;  Location: [facility] ENDOSCOPY;  Service: Gastroenterology;  Laterality: N/A;  . ESOPHAGOGASTRODUODENOSCOPY (EGD) WITH PROPOFOL N/A 11/22/2021   Procedure: ESOPHAGOGASTRODUODENOSCOPY (EGD) WITH PROPOFOL;  Surgeon: [name], MD;  Location: [facility] ENDOSCOPY;  Service: Gastroenterology;  Laterality: N/A;  . EYE SURGERY    . FLEXIBLE SIGMOIDOSCOPY N/A 10/13/2018   Procedure: FLEXIBLE SIGMOIDOSCOPY;  Surgeon: [name], MD;  Location: [facility] ENDOSCOPY;  Service: Gastroenterology;  Laterality: N/A;  . FLEXIBLE SIGMOIDOSCOPY N/A 09/09/2019   Procedure: FLEXIBLE SIGMOIDOSCOPY;  Surgeon: [name], MD;  Location: [facility]COPY;  Service: Gastroenterology;  Laterality: N/A;  . FLEXIBLE SIGMOIDOSCOPY N/A 08/04/2020   Procedure: FLEXIBLE SIGMOIDOSCOPY;  Surgeon: [name], MD;  Location: [facility] ENDOSCOPY;  Service: Gastroenterology;  Laterality: N/A;  Per Dr. Vicente Males, unsedated  . FLEXIBLE SIGMOIDOSCOPY N/A 08/31/2021   Procedure: FLEXIBLE SIGMOIDOSCOPY;  Surgeon: [name], MD;  Location: [facility] ENDOSCOPY;  Service: Gastroenterology;  Laterality: N/A;  no sedation  . HIP ARTHROPLASTY Right 09/27/2020   Procedure: ARTHROPLASTY BIPOLAR HIP (HEMIARTHROPLASTY);  Surgeon: Hessie Knows, MD;  Location: [facility] ORS;  Service: Orthopedics;  Laterality: Right;  . JOINT REPLACEMENT    . ROTATOR CUFF  REPAIR Bilateral    left-12/2009; right-03/2009 dr.califf  . TONSILLECTOMY    . TOTAL SHOULDER REPLACEMENT Left     Social History Social History   Tobacco Use  . Smoking status: Never  . Smokeless tobacco: Never  Vaping Use  . Vaping Use: Never used  Substance Use Topics  . Alcohol use: No  . Drug use: No    Family History Family History  Problem Relation Age of Onset  . COPD Mother   . Cancer Brother        colon    No Known Allergies   REVIEW OF SYSTEMS (Negative unless checked)  Constitutional: [] Weight loss  [] Fever  [] Chills Cardiac: [] Chest pain   [] Chest pressure   [] Palpitations   [] Shortness of breath when laying flat   [x] Shortness of breath at rest   [] Shortness of breath with exertion. Vascular:  [] Pain in legs with walking   [] Pain in legs at rest   [] Pain in legs when laying flat   [] Claudication   [] Pain in feet when walking  [] Pain in  feet at rest  [] Pain in feet when laying flat   [] History of DVT   [] Phlebitis   [] Swelling in legs   [] Varicose veins   [] Non-healing ulcers Pulmonary:   [] Uses home oxygen   [] Productive cough   [] Hemoptysis   [] Wheeze  [] COPD   [] Asthma Neurologic:  [] Dizziness  [] Blackouts   [] Seizures   [] History of stroke   [] History of TIA  [] Aphasia   [] Temporary blindness   [] Dysphagia   [] Weakness or numbness in arms   [] Weakness or numbness in legs Musculoskeletal:  [] Arthritis   [] Joint swelling   [] Joint pain   [] Low back pain Hematologic:  [] Easy bruising  [] Easy bleeding   [] Hypercoagulable state   [] Anemic  [] Hepatitis Gastrointestinal:  [] Blood in stool   [] Vomiting blood  [] Gastroesophageal reflux/heartburn   [] Difficulty swallowing. Genitourinary:  [] Chronic kidney disease   [] Difficult urination  [] Frequent urination  [] Burning with urination   [] Blood in urine Skin:  [] Rashes   [] Ulcers   [] Wounds Psychological:  [] History of anxiety   []  History of major depression.  Physical Examination  Vitals:   05/07/22 1800  05/07/22 1900 05/07/22 2017 05/07/22 2031  BP: (!) 102/57   (!) 111/56  Pulse: 72   77  Resp: (!) 32   (!) 25  Temp:      TempSrc:      SpO2: (!) 84% 95% 95% 93%  Weight:      Height:       Body mass index is 22.78 kg/m. Gen:  WD/WN, NAD Head: [place]/AT, No temporalis wasting. Prominent temp pulse not noted. Ear/Nose/Throat: Hearing grossly intact, nares w/o erythema or drainage, oropharynx w/o Erythema/Exudate Eyes: Sclera non-icteric, conjunctiva clear Neck: Trachea midline.  No JVD.  Pulmonary:  Good air movement, respirations not labored, equal bilaterally.  Cardiac: RRR, normal S1, S2. Vascular: *** Vessel Right Left  Radial Palpable Palpable  Ulnar Palpable Palpable  Brachial Palpable Palpable  Carotid Palpable, without bruit Palpable, without bruit  Aorta Not palpable N/A  Femoral Palpable Palpable  Popliteal Palpable Palpable  PT Palpable Palpable  DP Palpable Palpable   Gastrointestinal: soft, non-tender/non-distended. No guarding/reflex.  Musculoskeletal: M/S 5/5 throughout.  Extremities without ischemic changes.  No deformity or atrophy. No edema. Neurologic: Sensation grossly intact in extremities.  Symmetrical.  Speech is fluent. Motor exam as listed above. Psychiatric: Judgment intact, Mood & affect appropriate for pt's clinical situation. Dermatologic: No rashes or ulcers noted.  No cellulitis or open wounds. Lymph : No Cervical, Axillary, or Inguinal lymphadenopathy.    CBC Lab Results  Component Value Date   WBC 10.5 05/07/2022   HGB 8.7 (L) 05/07/2022   HCT 27.5 (L) 05/07/2022   MCV 88.7 05/07/2022   PLT 373 05/07/2022    BMET    Component Value Date/Time   NA 135 05/07/2022 0438   NA 139 09/21/2020 1320   NA 139 02/09/2014 0500   K 3.7 05/07/2022 0438   K 3.2 (L) 02/09/2014 0500   CL 97 (L) 05/07/2022 0438   CL 106 02/09/2014 0500   CO2 30 05/07/2022 0438   CO2 27 02/09/2014 0500   GLUCOSE 123 (H) 05/07/2022 0438   GLUCOSE 85 02/09/2014  0500   BUN 46 (H) 05/07/2022 0438   BUN 19 09/21/2020 1320   BUN 11 02/09/2014 0500   CREATININE 1.24 (H) 05/07/2022 0438   CREATININE 0.78 02/09/2014 0500   CALCIUM 8.3 (L) 05/07/2022 0438   CALCIUM 8.8 02/09/2014 0500   GFRNONAA 43 (L) 05/07/2022  [place] >60 02/09/2014 0500   GFRAA 76 09/21/2020 1320   GFRAA >60 02/09/2014 0500   Estimated Creatinine Clearance: 24 mL/min (A) (by C-G formula based on SCr of 1.24 mg/dL (H)).  COAG No results found for: "INR", "PROTIME"  Radiology DG Chest Port 1 View  Result Date: 05/07/2022 CLINICAL DATA:  Infiltrative lung present on imaging study. EXAM: PORTABLE CHEST 1 VIEW COMPARISON:  AP chest 05/04/2022 and 05/01/2021, CT chest 05/01/2022; chest two views 10/16/2021 FINDINGS: Cardiac silhouette is grossly at the upper limits of normal size. Mediastinal contours are not well evaluated given low lung volumes and frontal technique. Moderately decreased lung volumes are unchanged. Moderate bilateral interstitial thickening is similar to prior, noting chronic interstitial scarring on multiple prior studies. Mild patchy hazy opacities are similar to prior, likely superimposed atelectasis versus infection. No pleural effusion or pneumothorax. Mild dextrocurvature of the mid to lower thoracic spine. Multilevel degenerative disc and endplate changes. Kyphoplasty cement again overlies the T12 and L1 vertebral bodies. The right humeral head is again high-riding suggesting a superior right rotator cuff full-thickness tear. Partial visualization of reverse total left shoulder arthroplasty. IMPRESSION: No significant change in low lung volumes and moderate interstitial thickening with mild patchy acute airspace opacities. Findings are again consistent with chronic interstitial lung disease with superimposed mild atelectasis versus superimposed infection. Electronically Signed   By: Yvonne Kendall M.D.   On: 05/07/2022 08:27   DG Chest Port 1 View  Result  Date: 05/04/2022 CLINICAL DATA:  Admitted due to dyspnea and palpitations. Reports a decline of several months with acute worsening the past few days. Reports palpitations with minimal movement present for several weeks. No chest pain. Reports having to increase baseline home o2 of 2-3 liters to 4 liters lately, has had several months of hypoxia, and has worsened over past few weeks EXAM: PORTABLE CHEST 1 VIEW COMPARISON:  05/01/2022 and older studies. FINDINGS: Interstitial and hazy airspace lung opacities have mildly improved. Lung volumes remain low accentuating the lung opacities. No convincing pleural effusion and no pneumothorax. IMPRESSION: 1. Interval improvement. Mild decrease in the interstitial and hazy airspace lung opacities since the most recent prior study. No new abnormalities. Electronically Signed   By: Lajean Manes M.D.   On: 05/04/2022 09:27   ECHOCARDIOGRAM COMPLETE  Result Date: 05/01/2022    ECHOCARDIOGRAM REPORT   Patient Name:   MELBA ARAKI Date of Exam: 05/01/2022 Medical Rec #:  [id number]         Height:       58.0 in Accession #:    [id number]        Weight:       109.0 lb Date of Birth:  [birth year]         BSA:          1.407 m Patient Age:    30 years          BP:           147/87 mmHg Patient Gender: F                 HR:           96 bpm. Exam Location:  [facility] Procedure: 2D Echo, Cardiac Doppler and Color Doppler Indications:     R06.00 Dyspnea  History:         Patient has prior history of Echocardiogram examinations, most  recent 05/29/2020. Signs/Symptoms:Shortness of Breath, Chest                  Pain and Fatigue; Risk Factors:Hypertension, Dyslipidemia and                  Sleep Apnea. COVID-19.  Sonographer:     Cresenciano Lick RDCS Referring Phys:  [place] FHQR Diagnosing Phys: Yolonda Kida MD IMPRESSIONS  1. Left ventricular ejection fraction, by estimation, is 55 to 60%. The left ventricle has normal function. The left ventricle  has no regional wall motion abnormalities. Left ventricular diastolic parameters are consistent with Grade I diastolic dysfunction (impaired relaxation).  2. Right ventricular systolic function is normal. The right ventricular size is normal.  3. Left atrial size was mildly dilated.  4. The mitral valve is grossly normal. Mild mitral valve regurgitation.  5. The aortic valve is grossly normal. Aortic valve regurgitation is mild. FINDINGS  Left Ventricle: Left ventricular ejection fraction, by estimation, is 55 to 60%. The left ventricle has normal function. The left ventricle has no regional wall motion abnormalities. The left ventricular internal cavity size was normal in size. There is  borderline left ventricular hypertrophy. Left ventricular diastolic parameters are consistent with Grade I diastolic dysfunction (impaired relaxation). Right Ventricle: The right ventricular size is normal. No increase in right ventricular wall thickness. Right ventricular systolic function is normal. Left Atrium: Left atrial size was mildly dilated. Right Atrium: Right atrial size was normal in size. Pericardium: There is no evidence of pericardial effusion. Mitral Valve: The mitral valve is grossly normal. Mild mitral valve regurgitation. Tricuspid Valve: The tricuspid valve is normal in structure. Tricuspid valve regurgitation is mild. Aortic Valve: The aortic valve is grossly normal. Aortic valve regurgitation is mild. Aortic valve mean gradient measures 4.6 mmHg. Aortic valve peak gradient measures 8.3 mmHg. Pulmonic Valve: The pulmonic valve was normal in structure. Pulmonic valve regurgitation is not visualized. Aorta: The ascending aorta was not well visualized. IAS/Shunts: No atrial level shunt detected by color flow Doppler.  LEFT VENTRICLE PLAX 2D LVIDd:         2.90 cm Diastology LVIDs:         2.00 cm LV e' medial:    7.18 cm/s LV PW:         0.80 cm LV E/e' medial:  7.4 LV IVS:        0.80 cm LV e' lateral:   5.87  cm/s                        LV E/e' lateral: 9.0  RIGHT VENTRICLE RV Basal diam:  3.40 cm RV S prime:     7.69 cm/s TAPSE (M-mode): 0.7 cm LEFT ATRIUM             Index        RIGHT ATRIUM          Index LA diam:        3.60 cm 2.56 cm/m   RA Area:     5.07 cm LA Vol (A2C):   36.1 ml 25.66 ml/m  RA Volume:   8.09 ml  5.75 ml/m LA Vol (A4C):   25.4 ml 18.05 ml/m LA Biplane Vol: 33.0 ml 23.45 ml/m  AORTIC VALVE AV Vmax:           143.80 cm/s AV Vmean:          103.560 cm/s AV VTI:  0.232 m AV Peak Grad:      8.3 mmHg AV Mean Grad:      4.6 mmHg LVOT Vmax:         84.25 cm/s LVOT Vmean:        59.000 cm/s LVOT VTI:          0.122 m LVOT/AV VTI ratio: 0.52  AORTA Ao Root diam: 2.50 cm Ao Asc diam:  3.20 cm MITRAL VALVE               TRICUSPID VALVE MV Area (PHT): 3.65 cm    TR Peak grad:   37.5 mmHg MV Decel Time: 208 msec    TR Vmax:        306.00 cm/s MV E velocity: 52.90 cm/s MV A velocity: 87.30 cm/s  SHUNTS MV E/A ratio:  0.61        Systemic VTI: 0.12 m Dwayne D Callwood MD Electronically signed by Yolonda Kida MD Signature Date/Time: 05/01/2022/10:46:11 PM    Final    US Venous Img Lower Bilateral (DVT)  Result Date: 05/01/2022 CLINICAL DATA:  PE EXAM: Bilateral LOWER EXTREMITY VENOUS DOPPLER ULTRASOUND TECHNIQUE: Gray-scale sonography with compression, as well as color and duplex ultrasound, were performed to evaluate the deep venous system(s) from the level of the common femoral vein through the popliteal and proximal calf veins. COMPARISON:  Chest CT 05/01/2022 FINDINGS: VENOUS Normal compressibility of the common femoral, superficial femoral, and popliteal veins, as well as the visualized calf veins. Visualized portions of profunda femoral vein and great saphenous vein unremarkable. No filling defects to suggest DVT on grayscale or color Doppler imaging. Doppler waveforms show normal direction of venous flow, normal respiratory plasticity and response to augmentation. OTHER None.  Limitations: none IMPRESSION: Negative. Electronically Signed   By: Donavan Foil M.D.   On: 05/01/2022 20:51   DG Chest Portable 1 View  Result Date: 05/01/2022 CLINICAL DATA:  Shortness of breath. EXAM: PORTABLE CHEST 1 VIEW COMPARISON:  May 01, 2022 FINDINGS: The heart size and mediastinal contours are within normal limits. Low lung volumes are noted. Marked severity diffuse bilateral interstitial opacities are again seen. Underlying bibasilar pulmonary fibrotic changes are again noted. There is stable elevation of the left hemidiaphragm. There is no evidence of a pleural effusion or pneumothorax. A left shoulder prosthesis is seen. IMPRESSION: 1. Marked severity diffuse bilateral interstitial opacities which are, in part, chronic in nature. A superimposed infectious component cannot be excluded. Electronically Signed   By: Virgina Norfolk M.D.   On: 05/01/2022 20:07   CT Angio Chest Pulmonary Embolism (PE) W or WO Contrast  Result Date: 05/01/2022 CLINICAL DATA:  Insert EXAM: CT ANGIOGRAPHY CHEST WITH CONTRAST TECHNIQUE: Multidetector CT imaging of the chest was performed using the standard protocol during bolus administration of intravenous contrast. Multiplanar CT image reconstructions and MIPs were obtained to evaluate the vascular anatomy. RADIATION DOSE REDUCTION: This exam was performed according to the departmental dose-optimization program which includes automated exposure control, adjustment of the mA and/or kV according to patient size and/or use of iterative reconstruction technique. CONTRAST:  39m OMNIPAQUE IOHEXOL 350 MG/ML SOLN COMPARISON:  CT 10/19/2021 FINDINGS: Cardiovascular: Filling defect within the proximal LEFT lingular pulmonary artery (image 30/4). This defect is also seen on coronal image 30/7. This defect is partially occlusive. Potential additional second proximal LEFT lobe pulmonary artery filling defect on image 33/4. This is also partially occlusive. Mediastinum/Nodes:  Enlarged RIGHT lower paratracheal node measuring 14 mm (image 22/4) is new  from prior. Lungs/Pleura: There is severe ground-glass densities throughout the lungs superimposed on chronic interstitial lung disease. No pneumothorax. No pleural fluid. No infarction or pneumonia. Upper Abdomen: Limited view of the liver, kidneys, pancreas are unremarkable. Normal adrenal glands. Musculoskeletal: No aggressive osseous lesion. Review of the MIP images confirms the above findings. IMPRESSION: 1. Acute pulmonary emboli within the proximal lingular pulmonary artery and LEFT lobe pulmonary artery. These acute pulmonary emboli are partially occlusive. Overall clot burden is mild. 2. Extensive diffuse ground-glass densities superimposed on interstitial lung disease. Favor pulmonary edema pattern versus atypical pneumonia. 3. Enlarged RIGHT lower paratracheal node is favored reactive. Critical Value/emergent results were called by telephone at the time of interpretation on 05/01/2022 at 5:02 pm to provider [facility] , who verbally acknowledged these results. Electronically Signed   By: Suzy Bouchard M.D.   On: 05/01/2022 17:03   CT ABDOMEN PELVIS W CONTRAST  Result Date: 05/01/2022 CLINICAL DATA:  Shortness of breath. Sharp left lower quadrant pain. History of pulmonary fibrosis. EXAM: CT ABDOMEN AND PELVIS WITH CONTRAST TECHNIQUE: Multidetector CT imaging of the abdomen and pelvis was performed using the standard protocol following bolus administration of intravenous contrast. RADIATION DOSE REDUCTION: This exam was performed according to the departmental dose-optimization program which includes automated exposure control, adjustment of the mA and/or kV according to patient size and/or use of iterative reconstruction technique. CONTRAST:  42m OMNIPAQUE IOHEXOL 300 MG/ML  SOLN COMPARISON:  01/17/2022 CT.  02/14/2022 MRI. FINDINGS: Lower chest: Pulmonary fibrotic pattern with bronchiectasis. Pulmonary parenchymal opacity  appears worsened compared to prior study and there could be a degree of edema or infectious pneumonia. Hepatobiliary: Liver parenchyma is normal. Previous cholecystectomy. Pancreas: Normal Spleen: Normal Adrenals/Urinary Tract: Adrenal glands are normal. Kidneys are normal except for an 8 mm stone in the upper pole the right kidney without obstruction. Bladder is normal. Stomach/Bowel: Stomach and small intestine are normal. Redemonstration of a diffuse colitis pattern, particularly in the left colon, as was seen at previous imaging. The patient has diverticulosis but there is no convincing diverticulitis. Low level diverticulitis can be inapparent at imaging. Vascular/Lymphatic: Aortic atherosclerosis. No aneurysm. IVC is normal. No adenopathy. Reproductive: No pelvic mass. Other: No free fluid or air. Musculoskeletal: Chronic degenerative change and curvature of the spine. Old augmented fractures at T12, L1 and L4. Old healed superior endplate fracture at L3. IMPRESSION: Redemonstration of diffuse colitis pattern, particularly affecting the left colon, similar to the prior imaging of May and June of this year. Infectious or inflammatory colitis is favored. The patient does have diverticulosis but I do not see evidence of acute diverticulitis. Low level diverticulitis can be inapparent at imaging. Findings of chronic pulmonary fibrosis. Increased pulmonary parenchymal density which could be seen in the setting of edema or infectious inflammation. Nonobstructing 8 mm stone at the upper pole of the right kidney. Electronically Signed   By: MNelson ChimesM.D.   On: 05/01/2022 11:46   DG Chest Portable 1 View  Result Date: 05/01/2022 CLINICAL DATA:  Shortness of breath, history of pulmonary fibrosis EXAM: PORTABLE CHEST 1 VIEW COMPARISON:  10/16/2021 FINDINGS: The heart size and mediastinal contours are within normal limits. Diffuse bilateral interstitial opacity, superimposed upon chronic, bibasilar fibrotic  change. Chronic elevation of the left hemidiaphragm. Status post left shoulder reverse arthroplasty. The visualized skeletal structures are unremarkable. IMPRESSION: Diffuse bilateral interstitial opacity, superimposed upon chronic, bibasilar fibrotic change. Findings may reflect edema or infection. No focal airspace opacity. Electronically Signed   By: ACristie Hem  Cherly Beach M.D.   On: 05/01/2022 08:10      Assessment/Plan 1. *** 2. *** 3. ***  Family Communication:  Thank you for allowing [place] to participate in the care of this patient.   Kris Hartmann, NP [facility] [phone] (Office Phone) [phone] (Office Fax) [phone] (Pager)  05/07/2022 10:03 PM  Staff may message me via secure chat in [place]  but this may not receive immediate response,  please page for urgent matters!  Dictation software was used to generate the above note. Typos may occur and escape review, as with typed/written notes. Any error is purely unintentional.  Please contact me directly for clarity if needed.

## 2022-05-07 NOTE — Consult Note (Signed)
[facility] Consult Note  MRN : [id number]  [name] is a 86 y.o. (07-09-1936) female who presents with chief complaint of  Chief Complaint  Patient presents with   Shortness of Breath   Abdominal Pain   Weakness  .   Consulting Physician: Wallis Bamberg, MD Reason for consult: Pulmonary embolism History of Present Illness: [name] is a 86 year old female who presented to [facility] due to acute on chronic hypoxemia.  The patient is currently on home O2.  She had an increased oxygen requirement with worsening cough.  She has a previous medical history of ulcerative colitis pulmonary fibrosis, chronic pain syndrome.  In the midst of work-up it was found that the patient had a noted pulmonary embolism.  Due to the patient's history of ulcerative colitis, and current bloody diarrhea, she is unable to be anticoagulated.  Current Facility-Administered Medications  Medication Dose Route Frequency Provider Last Rate Last Admin   0.9 %  sodium chloride infusion  250 mL Intravenous PRN Wouk, Ailene Rud, MD       0.9 %  sodium chloride infusion   Intravenous Continuous Enzo Bi, MD 50 mL/hr at 05/07/22 1802 New Bag at 05/07/22 1802   acidophilus (RISAQUAD) capsule 2 capsule  2 capsule Oral TID Dallie Piles, RPH   2 capsule at 05/07/22 1702   albuterol (PROVENTIL) (2.5 MG/3ML) 0.083% nebulizer solution 2.5 mg  2.5 mg Nebulization TID Enzo Bi, MD   2.5 mg at 05/07/22 2017   amiodarone (NEXTERONE PREMIX) 360-4.14 MG/200ML-% (1.8 mg/mL) IV infusion  30 mg/hr Intravenous Continuous Foust, Katy L, NP 16.67 mL/hr at 05/07/22 0928 30 mg/hr at 05/07/22 3299   Chlorhexidine Gluconate Cloth 2 % PADS 6 each  6 each Topical Daily Enzo Bi, MD   6 each at 05/05/22 0923   citalopram (CELEXA) tablet 10 mg  10 mg Oral Daily Enzo Bi, MD   10 mg at 05/07/22 2426   feeding supplement (ENSURE ENLIVE / ENSURE PLUS) liquid 237 mL  237 mL  Oral BID BM Enzo Bi, MD   237 mL at 05/07/22 1450   methylPREDNISolone sodium succinate (SOLU-MEDROL) 40 mg/mL injection 40 mg  40 mg Intravenous Q24H Ottie Glazier, MD   40 mg at 05/07/22 1226   sodium chloride flush (NS) 0.9 % injection 3 mL  3 mL Intravenous Q12H Wouk, Ailene Rud, MD   3 mL at 05/07/22 0916   sodium chloride flush (NS) 0.9 % injection 3 mL  3 mL Intravenous PRN Wouk, Ailene Rud, MD        Past Medical History:  Diagnosis Date   Altered mental state    Anginal pain ([place])    Anxiety    Basal cell carcinoma 05/03/2021   right neck postauricular - Excised 06/12/21   BMI 30.0-30.9,adult    Cellulitis    Chest pain, atypical    Compression fracture of L4 lumbar vertebra    Constipation    COVID-19 virus infection 05/28/2020   Cystitis    Cystocele    Diverticulosis    Fatigue    Fibrosis, idiopathic pulmonary (HCC)    Gastroenteritis    History of back surgery    History of herniated intervertebral disc    HTN (hypertension)    Hypercholesteremia    Hypocalcemia    Incontinence of urine    OA (osteoarthritis)    shoulder and back   Obesity    Osteopenia    Pneumonia  Shortness of breath    Sleep apnea    Squamous cell carcinoma of skin 07/16/2016   Right upper lateral eyebrow. SCCis.   Squamous cell carcinoma of skin 01/13/2018   Left temporal hairline. WD SCC with superficial infiltration.   Thrush    Vitamin D deficiency     Past Surgical History:  Procedure Laterality Date   BACK SURGERY     CATARACT EXTRACTION W/PHACO Left 04/24/2016   Procedure: CATARACT EXTRACTION PHACO AND INTRAOCULAR LENS PLACEMENT (IOC);  Surgeon: Leandrew Koyanagi, MD;  Location: [facility];  Service: Ophthalmology;  Laterality: Left;   CATARACT EXTRACTION W/PHACO Right 07/14/2017   Procedure: CATARACT EXTRACTION PHACO AND INTRAOCULAR LENS PLACEMENT ([place])  RIGHT;  Surgeon: Leandrew Koyanagi, MD;  Location: [facility];  Service: Ophthalmology;   Laterality: Right;   CHOLECYSTECTOMY     COLONOSCOPY WITH PROPOFOL N/A 02/01/2022   Procedure: COLONOSCOPY WITH PROPOFOL;  Surgeon: [name], MD;  Location: [facility] ENDOSCOPY;  Service: Gastroenterology;  Laterality: N/A;   CYSTOSCOPY/URETEROSCOPY/HOLMIUM LASER/STENT PLACEMENT Left 04/27/2021   Procedure: CYSTOSCOPY/URETEROSCOPY/HOLMIUM LASER/STENT PLACEMENT;  Surgeon: Billey Co, MD;  Location: [facility] ORS;  Service: Urology;  Laterality: Left;   ESOPHAGOGASTRODUODENOSCOPY (EGD) WITH PROPOFOL N/A 10/13/2018   Procedure: ESOPHAGOGASTRODUODENOSCOPY (EGD) WITH PROPOFOL;  Surgeon: [name], MD;  Location: [facility] ENDOSCOPY;  Service: Gastroenterology;  Laterality: N/A;   ESOPHAGOGASTRODUODENOSCOPY (EGD) WITH PROPOFOL N/A 11/22/2021   Procedure: ESOPHAGOGASTRODUODENOSCOPY (EGD) WITH PROPOFOL;  Surgeon: [name], MD;  Location: [facility] ENDOSCOPY;  Service: Gastroenterology;  Laterality: N/A;   EYE SURGERY     FLEXIBLE SIGMOIDOSCOPY N/A 10/13/2018   Procedure: FLEXIBLE SIGMOIDOSCOPY;  Surgeon: [name], MD;  Location: [facility] ENDOSCOPY;  Service: Gastroenterology;  Laterality: N/A;   FLEXIBLE SIGMOIDOSCOPY N/A 09/09/2019   Procedure: FLEXIBLE SIGMOIDOSCOPY;  Surgeon: [name], MD;  Location: [facility] ENDOSCOPY;  Service: Gastroenterology;  Laterality: N/A;   FLEXIBLE SIGMOIDOSCOPY N/A 08/04/2020   Procedure: FLEXIBLE SIGMOIDOSCOPY;  Surgeon: [name], MD;  Location: [facility] ENDOSCOPY;  Service: Gastroenterology;  Laterality: N/A;  Per Dr. Vicente Males, unsedated   [place] N/A 08/31/2021   Procedure: FLEXIBLE SIGMOIDOSCOPY;  Surgeon: [name], MD;  Location: [facility] ENDOSCOPY;  Service: Gastroenterology;  Laterality: N/A;  no sedation   HIP ARTHROPLASTY Right 09/27/2020   Procedure: ARTHROPLASTY BIPOLAR HIP (HEMIARTHROPLASTY);  Surgeon: Hessie Knows, MD;  Location: [facility] ORS;  Service: Orthopedics;  Laterality: Right;   JOINT REPLACEMENT     ROTATOR CUFF REPAIR Bilateral    left-12/2009;  right-03/2009 dr.[email or web address]   TONSILLECTOMY     TOTAL SHOULDER REPLACEMENT Left     Social History Social History   Tobacco Use   Smoking status: Never   Smokeless tobacco: Never  Vaping Use   Vaping Use: Never used  Substance Use Topics   Alcohol use: No   Drug use: No    Family History Family History  Problem Relation Age of Onset   COPD Mother    Cancer Brother        colon    No Known Allergies   REVIEW OF SYSTEMS (Negative unless checked)  Constitutional: [] Weight loss  [] Fever  [] Chills Cardiac: [] Chest pain   [] Chest pressure   [] Palpitations   [] Shortness of breath when laying flat   [x] Shortness of breath at rest   [] Shortness of breath with exertion. Vascular:  [] Pain in legs with walking   [] Pain in legs at rest   [] Pain in legs when laying flat   [] Claudication   [] Pain in feet when walking  [] Pain  in feet at rest  [] Pain in feet when laying flat   [] History of DVT   [] Phlebitis   [] Swelling in legs   [] Varicose veins   [] Non-healing ulcers Pulmonary:   [x] Uses home oxygen   [] Productive cough   [] Hemoptysis   [] Wheeze  [] COPD   [] Asthma Neurologic:  [] Dizziness  [] Blackouts   [] Seizures   [] History of stroke   [] History of TIA  [] Aphasia   [] Temporary blindness   [] Dysphagia   [] Weakness or numbness in arms   [] Weakness or numbness in legs Musculoskeletal:  [] Arthritis   [] Joint swelling   [] Joint pain   [] Low back pain Hematologic:  [] Easy bruising  [] Easy bleeding   [] Hypercoagulable state   [] Anemic  [] Hepatitis Gastrointestinal:  [] Blood in stool   [] Vomiting blood  [] Gastroesophageal reflux/heartburn   [] Difficulty swallowing. Genitourinary:  [] Chronic kidney disease   [] Difficult urination  [] Frequent urination  [] Burning with urination   [] Blood in urine Skin:  [] Rashes   [] Ulcers   [] Wounds Psychological:  [] History of anxiety   []  History of major depression.  Physical Examination  Vitals:   05/07/22 1800 05/07/22 1900 05/07/22 2017 05/07/22 2031  BP:  (!) 102/57   (!) 111/56  Pulse: 72   77  Resp: (!) 32   (!) 25  Temp:      TempSrc:      SpO2: (!) 84% 95% 95% 93%  Weight:      Height:       Body mass index is 22.78 kg/m. Gen:  WD/WN, NAD Head: [place]/AT, No temporalis wasting. Prominent temp pulse not noted. Ear/Nose/Throat: Hearing grossly intact, nares w/o erythema or drainage, oropharynx w/o Erythema/Exudate Eyes: Sclera non-icteric, conjunctiva clear Neck: Trachea midline.  No JVD.  Pulmonary: Nonlabored at rest with high flow nasal cannula Cardiac: RRR, normal S1, S2. Vascular:  Vessel Right Left  Radial Palpable Palpable   Gastrointestinal: soft, non-tender/non-distended. No guarding/reflex.  Musculoskeletal: M/S 5/5 throughout.  Extremities without ischemic changes.  No deformity or atrophy. No edema. Neurologic: Sensation grossly intact in extremities.  Symmetrical.  Speech is fluent. Motor exam as listed above. Psychiatric: Judgment intact, Mood & affect appropriate for pt's clinical situation. Dermatologic: No rashes or ulcers noted.  No cellulitis or open wounds. Lymph : No Cervical, Axillary, or Inguinal lymphadenopathy.    CBC Lab Results  Component Value Date   WBC 10.5 05/07/2022   HGB 8.7 (L) 05/07/2022   HCT 27.5 (L) 05/07/2022   MCV 88.7 05/07/2022   PLT 373 05/07/2022    BMET    Component Value Date/Time   NA 135 05/07/2022 0438   NA 139 09/21/2020 1320   NA 139 02/09/2014 0500   K 3.7 05/07/2022 0438   K 3.2 (L) 02/09/2014 0500   CL 97 (L) 05/07/2022 0438   CL 106 02/09/2014 0500   CO2 30 05/07/2022 0438   CO2 27 02/09/2014 0500   GLUCOSE 123 (H) 05/07/2022 0438   GLUCOSE 85 02/09/2014 0500   BUN 46 (H) 05/07/2022 0438   BUN 19 09/21/2020 1320   BUN 11 02/09/2014 0500   CREATININE 1.24 (H) 05/07/2022 0438   CREATININE 0.78 02/09/2014 0500   CALCIUM 8.3 (L) 05/07/2022 0438   CALCIUM 8.8 02/09/2014 0500   GFRNONAA 43 (L) 05/07/2022 0438   GFRNONAA >60 02/09/2014 0500   GFRAA 76  09/21/2020 1320   GFRAA >60 02/09/2014 0500   Estimated Creatinine Clearance: 24 mL/min (A) (by C-G formula based on SCr of 1.24 mg/dL (H)).  COAG No results found for: "INR", "PROTIME"  Radiology DG Chest Port 1 View  Result Date: 05/07/2022 CLINICAL DATA:  Infiltrative lung present on imaging study. EXAM: PORTABLE CHEST 1 VIEW COMPARISON:  AP chest 05/04/2022 and 05/01/2021, CT chest 05/01/2022; chest two views 10/16/2021 FINDINGS: Cardiac silhouette is grossly at the upper limits of normal size. Mediastinal contours are not well evaluated given low lung volumes and frontal technique. Moderately decreased lung volumes are unchanged. Moderate bilateral interstitial thickening is similar to prior, noting chronic interstitial scarring on multiple prior studies. Mild patchy hazy opacities are similar to prior, likely superimposed atelectasis versus infection. No pleural effusion or pneumothorax. Mild dextrocurvature of the mid to lower thoracic spine. Multilevel degenerative disc and endplate changes. Kyphoplasty cement again overlies the T12 and L1 vertebral bodies. The right humeral head is again high-riding suggesting a superior right rotator cuff full-thickness tear. Partial visualization of reverse total left shoulder arthroplasty. IMPRESSION: No significant change in low lung volumes and moderate interstitial thickening with mild patchy acute airspace opacities. Findings are again consistent with chronic interstitial lung disease with superimposed mild atelectasis versus superimposed infection. Electronically Signed   By: Yvonne Kendall M.D.   On: 05/07/2022 08:27   DG Chest Port 1 View  Result Date: 05/04/2022 CLINICAL DATA:  Admitted due to dyspnea and palpitations. Reports a decline of several months with acute worsening the past few days. Reports palpitations with minimal movement present for several weeks. No chest pain. Reports having to increase baseline home o2 of 2-3 liters to 4 liters  lately, has had several months of hypoxia, and has worsened over past few weeks EXAM: PORTABLE CHEST 1 VIEW COMPARISON:  05/01/2022 and older studies. FINDINGS: Interstitial and hazy airspace lung opacities have mildly improved. Lung volumes remain low accentuating the lung opacities. No convincing pleural effusion and no pneumothorax. IMPRESSION: 1. Interval improvement. Mild decrease in the interstitial and hazy airspace lung opacities since the most recent prior study. No new abnormalities. Electronically Signed   By: Lajean Manes M.D.   On: 05/04/2022 09:27   ECHOCARDIOGRAM COMPLETE  Result Date: 05/01/2022    ECHOCARDIOGRAM REPORT   Patient Name:   CERI MAYER Date of Exam: 05/01/2022 Medical Rec #:  [id number]         Height:       58.0 in Accession #:    [id number]        Weight:       109.0 lb Date of Birth:  [birth year]         BSA:          1.407 m Patient Age:    68 years          BP:           147/87 mmHg Patient Gender: F                 HR:           96 bpm. Exam Location:  [facility] Procedure: 2D Echo, Cardiac Doppler and Color Doppler Indications:     R06.00 Dyspnea  History:         Patient has prior history of Echocardiogram examinations, most                  recent 05/29/2020. Signs/Symptoms:Shortness of Breath, Chest                  Pain and Fatigue; Risk Factors:Hypertension, Dyslipidemia and  Sleep Apnea. COVID-19.  Sonographer:     Cresenciano Lick RDCS Referring Phys:  Bessie RUEA Diagnosing Phys: Yolonda Kida MD IMPRESSIONS  1. Left ventricular ejection fraction, by estimation, is 55 to 60%. The left ventricle has normal function. The left ventricle has no regional wall motion abnormalities. Left ventricular diastolic parameters are consistent with Grade I diastolic dysfunction (impaired relaxation).  2. Right ventricular systolic function is normal. The right ventricular size is normal.  3. Left atrial size was mildly dilated.  4. The mitral valve  is grossly normal. Mild mitral valve regurgitation.  5. The aortic valve is grossly normal. Aortic valve regurgitation is mild. FINDINGS  Left Ventricle: Left ventricular ejection fraction, by estimation, is 55 to 60%. The left ventricle has normal function. The left ventricle has no regional wall motion abnormalities. The left ventricular internal cavity size was normal in size. There is  borderline left ventricular hypertrophy. Left ventricular diastolic parameters are consistent with Grade I diastolic dysfunction (impaired relaxation). Right Ventricle: The right ventricular size is normal. No increase in right ventricular wall thickness. Right ventricular systolic function is normal. Left Atrium: Left atrial size was mildly dilated. Right Atrium: Right atrial size was normal in size. Pericardium: There is no evidence of pericardial effusion. Mitral Valve: The mitral valve is grossly normal. Mild mitral valve regurgitation. Tricuspid Valve: The tricuspid valve is normal in structure. Tricuspid valve regurgitation is mild. Aortic Valve: The aortic valve is grossly normal. Aortic valve regurgitation is mild. Aortic valve mean gradient measures 4.6 mmHg. Aortic valve peak gradient measures 8.3 mmHg. Pulmonic Valve: The pulmonic valve was normal in structure. Pulmonic valve regurgitation is not visualized. Aorta: The ascending aorta was not well visualized. IAS/Shunts: No atrial level shunt detected by color flow Doppler.  LEFT VENTRICLE PLAX 2D LVIDd:         2.90 cm Diastology LVIDs:         2.00 cm LV e' medial:    7.18 cm/s LV PW:         0.80 cm LV E/e' medial:  7.4 LV IVS:        0.80 cm LV e' lateral:   5.87 cm/s                        LV E/e' lateral: 9.0  RIGHT VENTRICLE RV Basal diam:  3.40 cm RV S prime:     7.69 cm/s TAPSE (M-mode): 0.7 cm LEFT ATRIUM             Index        RIGHT ATRIUM          Index LA diam:        3.60 cm 2.56 cm/m   RA Area:     5.07 cm LA Vol (A2C):   36.1 ml 25.66 ml/m  RA  Volume:   8.09 ml  5.75 ml/m LA Vol (A4C):   25.4 ml 18.05 ml/m LA Biplane Vol: 33.0 ml 23.45 ml/m  AORTIC VALVE AV Vmax:           143.80 cm/s AV Vmean:          103.560 cm/s AV VTI:            0.232 m AV Peak Grad:      8.3 mmHg AV Mean Grad:      4.6 mmHg LVOT Vmax:         84.25 cm/s LVOT Vmean:  59.000 cm/s LVOT VTI:          0.122 m LVOT/AV VTI ratio: 0.52  AORTA Ao Root diam: 2.50 cm Ao Asc diam:  3.20 cm MITRAL VALVE               TRICUSPID VALVE MV Area (PHT): 3.65 cm    TR Peak grad:   37.5 mmHg MV Decel Time: 208 msec    TR Vmax:        306.00 cm/s MV E velocity: 52.90 cm/s MV A velocity: 87.30 cm/s  SHUNTS MV E/A ratio:  0.61        Systemic VTI: 0.12 m Yolonda Kida MD Electronically signed by Yolonda Kida MD Signature Date/Time: 05/01/2022/10:46:11 PM    Final    US Venous Img Lower Bilateral (DVT)  Result Date: 05/01/2022 CLINICAL DATA:  PE EXAM: Bilateral LOWER EXTREMITY VENOUS DOPPLER ULTRASOUND TECHNIQUE: Gray-scale sonography with compression, as well as color and duplex ultrasound, were performed to evaluate the deep venous system(s) from the level of the common femoral vein through the popliteal and proximal calf veins. COMPARISON:  Chest CT 05/01/2022 FINDINGS: VENOUS Normal compressibility of the common femoral, superficial femoral, and popliteal veins, as well as the visualized calf veins. Visualized portions of profunda femoral vein and great saphenous vein unremarkable. No filling defects to suggest DVT on grayscale or color Doppler imaging. Doppler waveforms show normal direction of venous flow, normal respiratory plasticity and response to augmentation. OTHER None. Limitations: none IMPRESSION: Negative. Electronically Signed   By: Donavan Foil M.D.   On: 05/01/2022 20:51   DG Chest Portable 1 View  Result Date: 05/01/2022 CLINICAL DATA:  Shortness of breath. EXAM: PORTABLE CHEST 1 VIEW COMPARISON:  May 01, 2022 FINDINGS: The heart size and mediastinal  contours are within normal limits. Low lung volumes are noted. Marked severity diffuse bilateral interstitial opacities are again seen. Underlying bibasilar pulmonary fibrotic changes are again noted. There is stable elevation of the left hemidiaphragm. There is no evidence of a pleural effusion or pneumothorax. A left shoulder prosthesis is seen. IMPRESSION: 1. Marked severity diffuse bilateral interstitial opacities which are, in part, chronic in nature. A superimposed infectious component cannot be excluded. Electronically Signed   By: Virgina Norfolk M.D.   On: 05/01/2022 20:07   CT Angio Chest Pulmonary Embolism (PE) W or WO Contrast  Result Date: 05/01/2022 CLINICAL DATA:  Insert EXAM: CT ANGIOGRAPHY CHEST WITH CONTRAST TECHNIQUE: Multidetector CT imaging of the chest was performed using the standard protocol during bolus administration of intravenous contrast. Multiplanar CT image reconstructions and MIPs were obtained to evaluate the vascular anatomy. RADIATION DOSE REDUCTION: This exam was performed according to the departmental dose-optimization program which includes automated exposure control, adjustment of the mA and/or kV according to patient size and/or use of iterative reconstruction technique. CONTRAST:  9m OMNIPAQUE IOHEXOL 350 MG/ML SOLN COMPARISON:  CT 10/19/2021 FINDINGS: Cardiovascular: Filling defect within the proximal LEFT lingular pulmonary artery (image 30/4). This defect is also seen on coronal image 30/7. This defect is partially occlusive. Potential additional second proximal LEFT lobe pulmonary artery filling defect on image 33/4. This is also partially occlusive. Mediastinum/Nodes: Enlarged RIGHT lower paratracheal node measuring 14 mm (image 22/4) is new from prior. Lungs/Pleura: There is severe ground-glass densities throughout the lungs superimposed on chronic interstitial lung disease. No pneumothorax. No pleural fluid. No infarction or pneumonia. Upper Abdomen: Limited  view of the liver, kidneys, pancreas are unremarkable. Normal adrenal glands. Musculoskeletal: No aggressive  osseous lesion. Review of the MIP images confirms the above findings. IMPRESSION: 1. Acute pulmonary emboli within the proximal lingular pulmonary artery and LEFT lobe pulmonary artery. These acute pulmonary emboli are partially occlusive. Overall clot burden is mild. 2. Extensive diffuse ground-glass densities superimposed on interstitial lung disease. Favor pulmonary edema pattern versus atypical pneumonia. 3. Enlarged RIGHT lower paratracheal node is favored reactive. Critical Value/emergent results were called by telephone at the time of interpretation on 05/01/2022 at 5:02 pm to provider [facility] , who verbally acknowledged these results. Electronically Signed   By: Suzy Bouchard M.D.   On: 05/01/2022 17:03   CT ABDOMEN PELVIS W CONTRAST  Result Date: 05/01/2022 CLINICAL DATA:  Shortness of breath. Sharp left lower quadrant pain. History of pulmonary fibrosis. EXAM: CT ABDOMEN AND PELVIS WITH CONTRAST TECHNIQUE: Multidetector CT imaging of the abdomen and pelvis was performed using the standard protocol following bolus administration of intravenous contrast. RADIATION DOSE REDUCTION: This exam was performed according to the departmental dose-optimization program which includes automated exposure control, adjustment of the mA and/or kV according to patient size and/or use of iterative reconstruction technique. CONTRAST:  35m OMNIPAQUE IOHEXOL 300 MG/ML  SOLN COMPARISON:  01/17/2022 CT.  02/14/2022 MRI. FINDINGS: Lower chest: Pulmonary fibrotic pattern with bronchiectasis. Pulmonary parenchymal opacity appears worsened compared to prior study and there could be a degree of edema or infectious pneumonia. Hepatobiliary: Liver parenchyma is normal. Previous cholecystectomy. Pancreas: Normal Spleen: Normal Adrenals/Urinary Tract: Adrenal glands are normal. Kidneys are normal except for an 8 mm stone in  the upper pole the right kidney without obstruction. Bladder is normal. Stomach/Bowel: Stomach and small intestine are normal. Redemonstration of a diffuse colitis pattern, particularly in the left colon, as was seen at previous imaging. The patient has diverticulosis but there is no convincing diverticulitis. Low level diverticulitis can be inapparent at imaging. Vascular/Lymphatic: Aortic atherosclerosis. No aneurysm. IVC is normal. No adenopathy. Reproductive: No pelvic mass. Other: No free fluid or air. Musculoskeletal: Chronic degenerative change and curvature of the spine. Old augmented fractures at T12, L1 and L4. Old healed superior endplate fracture at L3. IMPRESSION: Redemonstration of diffuse colitis pattern, particularly affecting the left colon, similar to the prior imaging of May and June of this year. Infectious or inflammatory colitis is favored. The patient does have diverticulosis but I do not see evidence of acute diverticulitis. Low level diverticulitis can be inapparent at imaging. Findings of chronic pulmonary fibrosis. Increased pulmonary parenchymal density which could be seen in the setting of edema or infectious inflammation. Nonobstructing 8 mm stone at the upper pole of the right kidney. Electronically Signed   By: MNelson ChimesM.D.   On: 05/01/2022 11:46   DG Chest Portable 1 View  Result Date: 05/01/2022 CLINICAL DATA:  Shortness of breath, history of pulmonary fibrosis EXAM: PORTABLE CHEST 1 VIEW COMPARISON:  10/16/2021 FINDINGS: The heart size and mediastinal contours are within normal limits. Diffuse bilateral interstitial opacity, superimposed upon chronic, bibasilar fibrotic change. Chronic elevation of the left hemidiaphragm. Status post left shoulder reverse arthroplasty. The visualized skeletal structures are unremarkable. IMPRESSION: Diffuse bilateral interstitial opacity, superimposed upon chronic, bibasilar fibrotic change. Findings may reflect edema or infection. No  focal airspace opacity. Electronically Signed   By: ADelanna AhmadiM.D.   On: 05/01/2022 08:10      Assessment/Plan 1.  Pulmonary embolism  While the patient's pulmonary embolism is not significant in size a pulmonary thrombectomy may help to improve her already tenuous respiratory situation.  However  because the patient is not able to tolerate anticoagulation will have an IVC filter placed.  I have discussed the risk benefits alternatives with the patient she is agreeable to proceed.  Family Communication:  Thank you for allowing [place] to participate in the care of this patient.   Kris Hartmann, NP [facility] [phone] (Office Phone) [phone] (Office Fax) [phone] (Pager)  05/07/2022 10:03 PM  Staff may message me via secure chat in [place]  but this may not receive immediate response,  please page for urgent matters!  Dictation software was used to generate the above note. Typos may occur and escape review, as with typed/written notes. Any error is purely unintentional.  Please contact me directly for clarity if needed.

## 2022-05-07 NOTE — Progress Notes (Signed)
Date of Admission:  05/01/2022    ID: [name] is a 86 y.o. female  Principal Problem:   CAP (community acquired pneumonia) Active Problems:   HTN (hypertension)   Idiopathic pulmonary fibrosis (HCC)   Chronic pain syndrome   Ulcerative colitis ([place])   Acute pulmonary embolism (HCC)   Acute respiratory failure with hypoxia (HCC)   Interstitial lung disease (HCC)    Subjective: Pt had dark red stool over the weekend Last night had Fast Heart rate with Afib and got started on diltiazem She is stable  Daughter and husband at bed side  Medications:   acidophilus  2 capsule Oral TID   albuterol  2.5 mg Nebulization TID   Chlorhexidine Gluconate Cloth  6 each Topical Daily   citalopram  10 mg Oral Daily   feeding supplement  237 mL Oral BID BM   methylPREDNISolone (SOLU-MEDROL) injection  40 mg Intravenous Q24H   sodium chloride flush  3 mL Intravenous Q12H   [START ON 05/08/2022] sulfamethoxazole-trimethoprim  1 tablet Oral Once per day on Mon Wed Fri    Objective: Vital signs in last 24 hours: Temp:  [97.7 F (36.5 C)-99.5 F (37.5 C)] 98.4 F (36.9 C) (09/05 1500) Pulse Rate:  [60-145] 79 (09/05 1500) Resp:  [15-34] 21 (09/05 1500) BP: (86-126)/(48-71) 112/57 (09/05 1500) SpO2:  [90 %-100 %] 100 % (09/05 1500)    PHYSICAL EXAM:  General: Alert, cooperative, no distress,  Lungs: b/l air entry basal coarse crepts Heart: irregular- rate controlled Abdomen: Soft, non-tender,not distended. Bowel sounds normal. No masses Extremities: atraumatic, no cyanosis. No edema. No clubbing Skin:  bruising Lymph: Cervical, supraclavicular normal. Neurologic: Grossly non-focal  Lab Results Recent Labs    05/06/22 0538 05/06/22 2218 05/07/22 0438  WBC 13.6*  --  10.5  HGB 10.9* 10.0* 8.7*  HCT 34.9* 31.9* 27.5*  NA 139 137 135  K 4.7 3.9 3.7  CL 94* 97* 97*  CO2 31 32 30  BUN 47* 51* 46*  CREATININE 1.68* 1.45* 1.24*   Liver Panel No results for input(s):  "PROT", "ALBUMIN", "AST", "ALT", "ALKPHOS", "BILITOT", "BILIDIR", "IBILI" in the last 72 hours.  Sedimentation Rate No results for input(s): "ESRSEDRATE" in the last 72 hours.  C-Reactive Protein Recent Labs    05/07/22 0855  CRP 1.0*    Microbiology:  Studies/Results: DG Chest Port 1 View  Result Date: 05/07/2022 CLINICAL DATA:  Infiltrative lung present on imaging study. EXAM: PORTABLE CHEST 1 VIEW COMPARISON:  AP chest 05/04/2022 and 05/01/2021, CT chest 05/01/2022; chest two views 10/16/2021 FINDINGS: Cardiac silhouette is grossly at the upper limits of normal size. Mediastinal contours are not well evaluated given low lung volumes and frontal technique. Moderately decreased lung volumes are unchanged. Moderate bilateral interstitial thickening is similar to prior, noting chronic interstitial scarring on multiple prior studies. Mild patchy hazy opacities are similar to prior, likely superimposed atelectasis versus infection. No pleural effusion or pneumothorax. Mild dextrocurvature of the mid to lower thoracic spine. Multilevel degenerative disc and endplate changes. Kyphoplasty cement again overlies the T12 and L1 vertebral bodies. The right humeral head is again high-riding suggesting a superior right rotator cuff full-thickness tear. Partial visualization of reverse total left shoulder arthroplasty. IMPRESSION: No significant change in low lung volumes and moderate interstitial thickening with mild patchy acute airspace opacities. Findings are again consistent with chronic interstitial lung disease with superimposed mild atelectasis versus superimposed infection. Electronically Signed   By: Yvonne Kendall M.D.   On:  05/07/2022 08:27     Assessment/Plan: Acute on chronic hypoxic resp failure in a patient with underlying interstitial lung disease COVID. Resp viral panel neg? Small PE No obvious consolidation But has b/l GGO on CT- ? Element of fluid overload with underlying  fibrosis Normal procal Cefepime discontinued  . She has had recurrent Cdiff and has undergone FMT So the risk of antibiotic outweighs benefit currently  On high dose steroids, so bactrim was added on Friday for PCP prophylaxis- creatinine increased and the dose was reduced- will dc bactrim as risk outweighs benefit Pt has some urinary retention and had to undergo straight cath Repeat bladder scan pending  Afib on diltiazem  fungitell, fungal antibodies pending as she is on entyvio   Acute on chronic diastolic CHF Small PE on eliquis   H/o recurrent cdiff needing FMT- avoid antibiotics as much as possible    Ulcerative colitis on entyvio   ? Discussed the management with patient and her family and the hospitlaist-

## 2022-05-08 ENCOUNTER — Encounter: Payer: Self-pay | Admitting: Certified Registered Nurse Anesthetist

## 2022-05-08 ENCOUNTER — Encounter
Admission: EM | Disposition: A | Discharge status: Hospice | Payer: Self-pay | Source: Home / Self Care | Attending: Hospitalist

## 2022-05-08 DIAGNOSIS — K922 Gastrointestinal hemorrhage, unspecified: Secondary | ICD-10-CM

## 2022-05-08 DIAGNOSIS — J9601 Acute respiratory failure with hypoxia: Secondary | ICD-10-CM | POA: Diagnosis not present

## 2022-05-08 DIAGNOSIS — K5190 Ulcerative colitis, unspecified, without complications: Secondary | ICD-10-CM | POA: Diagnosis not present

## 2022-05-08 DIAGNOSIS — J84112 Idiopathic pulmonary fibrosis: Secondary | ICD-10-CM | POA: Diagnosis not present

## 2022-05-08 DIAGNOSIS — I2699 Other pulmonary embolism without acute cor pulmonale: Secondary | ICD-10-CM | POA: Diagnosis not present

## 2022-05-08 DIAGNOSIS — Z8619 Personal history of other infectious and parasitic diseases: Secondary | ICD-10-CM | POA: Diagnosis not present

## 2022-05-08 DIAGNOSIS — J189 Pneumonia, unspecified organism: Secondary | ICD-10-CM | POA: Diagnosis not present

## 2022-05-08 HISTORY — PX: PULMONARY THROMBECTOMY: CATH118295

## 2022-05-08 LAB — CBC
HCT: 24.9 % — ABNORMAL LOW (ref 36.0–46.0)
Hemoglobin: 7.8 g/dL — ABNORMAL LOW (ref 12.0–15.0)
MCH: 28.1 pg (ref 26.0–34.0)
MCHC: 31.3 g/dL (ref 30.0–36.0)
MCV: 89.6 fL (ref 80.0–100.0)
Platelets: 380 10*3/uL (ref 150–400)
RBC: 2.78 MIL/uL — ABNORMAL LOW (ref 3.87–5.11)
RDW: 15.1 % (ref 11.5–15.5)
WBC: 9.4 10*3/uL (ref 4.0–10.5)
nRBC: 0 % (ref 0.0–0.2)

## 2022-05-08 LAB — BASIC METABOLIC PANEL
Anion gap: 7 (ref 5–15)
BUN: 33 mg/dL — ABNORMAL HIGH (ref 8–23)
CO2: 31 mmol/L (ref 22–32)
Calcium: 8.2 mg/dL — ABNORMAL LOW (ref 8.9–10.3)
Chloride: 96 mmol/L — ABNORMAL LOW (ref 98–111)
Creatinine, Ser: 1.03 mg/dL — ABNORMAL HIGH (ref 0.44–1.00)
GFR, Estimated: 54 mL/min — ABNORMAL LOW (ref >60)
Glucose, Bld: 202 mg/dL — ABNORMAL HIGH (ref 70–99)
Potassium: 4 mmol/L (ref 3.5–5.1)
Sodium: 134 mmol/L — ABNORMAL LOW (ref 135–145)

## 2022-05-08 LAB — HEMOGLOBIN AND HEMATOCRIT, BLOOD
HCT: 31.1 % — ABNORMAL LOW (ref 36.0–46.0)
Hemoglobin: 10.3 g/dL — ABNORMAL LOW (ref 12.0–15.0)

## 2022-05-08 LAB — FUNGITELL, SERUM: Fungitell Result: 40 pg/mL (ref <80)

## 2022-05-08 LAB — ABO/RH: ABO/RH(D): A NEG

## 2022-05-08 LAB — PREPARE RBC (CROSSMATCH)

## 2022-05-08 LAB — MAGNESIUM: Magnesium: 1.9 mg/dL (ref 1.7–2.4)

## 2022-05-08 SURGERY — PULMONARY THROMBECTOMY
Anesthesia: Moderate Sedation

## 2022-05-08 MED ORDER — SODIUM CHLORIDE 0.9% IV SOLUTION: Freq: Once | INTRAVENOUS | Status: AC

## 2022-05-08 MED ORDER — CEFAZOLIN SODIUM-DEXTROSE 2-4 GM/100ML-% IV SOLN
INTRAVENOUS | Status: AC
  Administered 2022-05-08: 2 g via INTRAVENOUS
  Filled 2022-05-08: qty 100

## 2022-05-08 MED ORDER — SODIUM CHLORIDE 0.9 % IV SOLN: INTRAVENOUS | Status: DC

## 2022-05-08 MED ORDER — MIDAZOLAM HCL 2 MG/2ML IJ SOLN
INTRAMUSCULAR | Status: DC | PRN
  Administered 2022-05-08: 1 mg via INTRAVENOUS
  Administered 2022-05-08: .5 mg via INTRAVENOUS

## 2022-05-08 MED ORDER — FAMOTIDINE 20 MG PO TABS: 40.0000 mg | ORAL_TABLET | Freq: Once | ORAL | Status: DC | PRN

## 2022-05-08 MED ORDER — MIDAZOLAM HCL 2 MG/2ML IJ SOLN
INTRAMUSCULAR | Status: AC
  Filled 2022-05-08: qty 2

## 2022-05-08 MED ORDER — MIDAZOLAM HCL 2 MG/ML PO SYRP
8.0000 mg | ORAL_SOLUTION | Freq: Once | ORAL | Status: DC | PRN
  Filled 2022-05-08: qty 5

## 2022-05-08 MED ORDER — DIPHENHYDRAMINE HCL 50 MG/ML IJ SOLN: 50.0000 mg | Freq: Once | INTRAMUSCULAR | Status: DC | PRN

## 2022-05-08 MED ORDER — HYDROMORPHONE HCL 1 MG/ML IJ SOLN
1.0000 mg | Freq: Once | INTRAMUSCULAR | Status: AC | PRN
  Administered 2022-05-08: 1 mg via INTRAVENOUS
  Filled 2022-05-08: qty 1

## 2022-05-08 MED ORDER — FENTANYL CITRATE (PF) 100 MCG/2ML IJ SOLN
INTRAMUSCULAR | Status: AC
  Filled 2022-05-08: qty 2

## 2022-05-08 MED ORDER — ONDANSETRON HCL 4 MG/2ML IJ SOLN
4.0000 mg | Freq: Four times a day (QID) | INTRAMUSCULAR | Status: DC | PRN
  Administered 2022-05-18: 4 mg via INTRAVENOUS
  Filled 2022-05-08: qty 2

## 2022-05-08 MED ORDER — FENTANYL CITRATE (PF) 100 MCG/2ML IJ SOLN
INTRAMUSCULAR | Status: DC | PRN
  Administered 2022-05-08: 25 ug via INTRAVENOUS
  Administered 2022-05-08: 50 ug via INTRAVENOUS

## 2022-05-08 MED ORDER — HEPARIN SODIUM (PORCINE) 1000 UNIT/ML IJ SOLN
INTRAMUSCULAR | Status: DC | PRN
  Administered 2022-05-08: 3000 [IU] via INTRAVENOUS

## 2022-05-08 MED ORDER — CEFAZOLIN SODIUM-DEXTROSE 2-4 GM/100ML-% IV SOLN: 2.0000 g | INTRAVENOUS | Status: AC

## 2022-05-08 MED ORDER — IODIXANOL 320 MG/ML IV SOLN
INTRAVENOUS | Status: DC | PRN
  Administered 2022-05-08: 75 mL

## 2022-05-08 MED ORDER — HEPARIN SODIUM (PORCINE) 1000 UNIT/ML IJ SOLN
INTRAMUSCULAR | Status: AC
  Filled 2022-05-08: qty 10

## 2022-05-08 MED ORDER — METHYLPREDNISOLONE SODIUM SUCC 125 MG IJ SOLR: 125.0000 mg | Freq: Once | INTRAMUSCULAR | Status: DC | PRN

## 2022-05-08 SURGICAL SUPPLY — 20 items
CANISTER PENUMBRA ENGINE (MISCELLANEOUS) IMPLANT
CATH ANGIO 5F PIGTAIL 100CM (CATHETERS) IMPLANT
CATH INDIGO 12XTORQ 100 (CATHETERS) IMPLANT
CATH INDIGO SEP 8 (CATHETERS) IMPLANT
CATH INFINITI JR4 5F (CATHETERS) IMPLANT
CATH LIGHTNING 8 XTORQ 115 (CATHETERS) IMPLANT
CATH SELECT BERN TIP 5F 130 (CATHETERS) IMPLANT
CLOSURE PERCLOSE PROSTYLE (VASCULAR PRODUCTS) IMPLANT
GLIDEWIRE ADV .035X260CM (WIRE) IMPLANT
KIT FEM OPTION ELITE FILTER (Filter) IMPLANT
PACK ANGIOGRAPHY (CUSTOM PROCEDURE TRAY) ×1 IMPLANT
SHEATH BRITE TIP 6FRX11 (SHEATH) IMPLANT
SHEATH PINNACLE 11FRX10 (SHEATH) IMPLANT
SHEATH PROBE COVER 6X72 (BAG) IMPLANT
SUT PROLENE 0 CT 1 30 (SUTURE) IMPLANT
SYR MEDRAD MARK 7 150ML (SYRINGE) IMPLANT
TUBING CONTRAST HIGH PRESS 72 (TUBING) IMPLANT
WIRE AMPLATZ SSTIFF .035X260CM (WIRE) IMPLANT
WIRE GUIDERIGHT .035X150 (WIRE) IMPLANT
WIRE MAGIC TORQUE 260C (WIRE) IMPLANT

## 2022-05-08 NOTE — Progress Notes (Signed)
[facility] NAME: [name]    MR#:  [id number]  DATE OF BIRTH:  1935-09-27  SUBJECTIVE:  husband at bedside. Patient started on 10 later high flow nasal cannula oxygen. According to nurse patient he status pretty easily. On amiodarone drip. Heart rate has been in the 60s sinus rhythm for more than 36 hours. Denies any chest pain. Continues with blood he discharge through rectum. Denies abdominal pain.    VITALS:  Blood pressure (!) 121/94, pulse 73, temperature 98.2 F (36.8 C), temperature source Oral, resp. rate (!) 33, height 4' 10"  (1.473 m), weight 49.4 kg, SpO2 96 %.  PHYSICAL EXAMINATION:   GENERAL:  86 y.o.-year-old patient lying in the bed with no acute distress. Chronically ill. LUNGS: Normal breath sounds bilaterally, no wheezing, rales, rhonchi.  CARDIOVASCULAR: S1, S2 normal. No murmurs, rubs, or gallops. Sinus rhythm ABDOMEN: Soft, nontender, nondistended. Bowel sounds present.  EXTREMITIES: No  edema b/l.    NEUROLOGIC: nonfocal  patient is alert and awake SKIN: per RN  LABORATORY PANEL:  CBC Recent Labs  Lab 05/08/22 0536  WBC 9.4  HGB 7.8*  HCT 24.9*  PLT 380    Chemistries  Recent Labs  Lab 05/08/22 0536  NA 134*  K 4.0  CL 96*  CO2 31  GLUCOSE 202*  BUN 33*  CREATININE 1.03*  CALCIUM 8.2*  MG 1.9   Cardiac Enzymes No results for input(s): "TROPONINI" in the last 168 hours. RADIOLOGY:  DG Chest Port 1 View  Result Date: 05/07/2022 CLINICAL DATA:  Infiltrative lung present on imaging study. EXAM: PORTABLE CHEST 1 VIEW COMPARISON:  AP chest 05/04/2022 and 05/01/2021, CT chest 05/01/2022; chest two views 10/16/2021 FINDINGS: Cardiac silhouette is grossly at the upper limits of normal size. Mediastinal contours are not well evaluated given low lung volumes and frontal technique. Moderately decreased lung volumes are unchanged. Moderate bilateral interstitial thickening is similar to prior,  noting chronic interstitial scarring on multiple prior studies. Mild patchy hazy opacities are similar to prior, likely superimposed atelectasis versus infection. No pleural effusion or pneumothorax. Mild dextrocurvature of the mid to lower thoracic spine. Multilevel degenerative disc and endplate changes. Kyphoplasty cement again overlies the T12 and L1 vertebral bodies. The right humeral head is again high-riding suggesting a superior right rotator cuff full-thickness tear. Partial visualization of reverse total left shoulder arthroplasty. IMPRESSION: No significant change in low lung volumes and moderate interstitial thickening with mild patchy acute airspace opacities. Findings are again consistent with chronic interstitial lung disease with superimposed mild atelectasis versus superimposed infection. Electronically Signed   By: Yvonne Kendall M.D.   On: 05/07/2022 08:27    Assessment and Plan  [name] is a 86 y.o. female with medical history significant for ILD, recurrent c diff, ulcerative colitis, chronic pain, chronic hypoxic respiratory failure, who presented with dyspnea and palpitations.   Reports a decline of several months with acute worsening the days PTA.  Reports having to increase baseline home o2 of 2-3 liters to 4 liters lately, has had several months of hypoxia, and has worsened over past few weeks to being dyspneic at rest worse with minimal ambulation.    Acute on chronic hypoxemic respiratory failure --recently, pt has been on 4-5L home O2.  Pt was put on NRB in the ED with Sats of 94%.  Currently needing 10L. --covid and RVP neg.  likely progressive ILD, pulm edema, and small PE contributing.  Questionable PNA.  Started on tx for all 4 etiologies on admission. --Continue supplemental O2 to keep sats between 88-92%, wean as tolerated   ILD Underlying severe ILD that is likely progressing but at least several days of acutely worsening dyspnea.  --started on prednisone 50  mg daily on admission and escalated up to IV solumedrol. --pulm consulted, Dr. Lanney Gins --cont IV solumedrol 40 mg daily, per pulm --Albuterol neb scheduled, per pulm --wean oxygen slowly down   Bloody Diarrhea, worsening --started having more frequent diarrhea starting night of 9/1.   --C diff neg on presentation.  Likely due to abx pt received during the first 2 days. --GI consult with Dr. [name]-- recommends avoid antidiarrheals, follow-up with Dr. [name].I. after hospital discharge. Patient is also advised to avoid her make schedule dose of Entyvio secondary to community acquired pneumonia --diarrhea noted to be bloody on 9/3 --d/c Eliquis -- hemoglobin 7.8  Acute blood loss anemia --Hgb dropped from 12.5 to 8.7 in 3 days from bloody diarrhea --Monitor Hgb and transfuse to keep Hgb >8 --hemoglobin 7.8 will give 1 unit of blood transfusion.  --came in with hgb 12.8---7.8--1 unit BT  Pulmonary embolus, acute Small on CT, doubt it's the main driver for her dyspnea.  Started on Eliquis --d/c Eliquis due to bloody diarrhea --vascular for thrombectomy and IVC filter tomorrow--Dr Lucky Cowboy consulted   AKI --Cr went up to 1.68, from baseline of 0.7 --likely 2/2 to dehydration from diarrhea --improved with IVF --received IVF--creat down to 1.0   Afib w RVR --started night of 9/4.  Started on amiodarone gtt due to hypotension --pt remains in SR--will d/c amiodarone gtt for now    Acute on chronic diastolic CHF --pulm edema seen on CT, elevated BNP --started on IV lasix 40 mg daily, d/c'ed on 9/2 --hold further diuretics due to diarrhea --will give prn lasix   CAP, ruled out --started on cefepime on admission --ID consulted, and ruled out PNA, abx d/c'ed after 2 days.   History recurrent C diff --has multiple BM's per day, sometimes loose, but pt said that's her baseline --C diff neg on presentation   Chronic pain - home norco prn   UC On entyvio ever 2 weeks at  home --follows Dr Vicente Males   Hypokalemia --likely due to diarrhea --monitor and replete PRN     DVT prophylaxis: SCD/Compression stockings Code Status: Full code  Family Communication: husband updated at bedside today Level of care: Progressive Dispo:   The patient is from: home  PT/OT after vascular procedure     TOTAL TIME TAKING CARE OF THIS PATIENT: 35 minutes.  >50% time spent on counselling and coordination of care  Note: This dictation was prepared with Dragon dictation along with smaller phrase technology. Any transcriptional errors that result from this process are unintentional.  Fritzi Mandes M.D    [facility]   CC: Primary care physician; [name], NP

## 2022-05-08 NOTE — Interval H&P Note (Signed)
History and Physical Interval Note:  05/08/2022 3:42 PM  [name]  has presented today for surgery, with the diagnosis of Pulmonary embolism.  The various methods of treatment have been discussed with the patient and family. After consideration of risks, benefits and other options for treatment, the patient has consented to  Procedure(s): PULMONARY THROMBECTOMY (N/A) as a surgical intervention.  The patient's history has been reviewed, patient examined, no change in status, stable for surgery.  I have reviewed the patient's chart and labs.  Questions were answered to the patient's satisfaction.     [name]

## 2022-05-08 NOTE — Op Note (Signed)
[facility]  Percutaneous Study/Intervention Procedural Note   Date of Surgery: 05/08/2022,5:22 PM  Surgeon: [name]  Pre-operative Diagnosis: Symptomatic left pulmonary emboli, GI bleed  Post-operative diagnosis:  Same  Procedure(s) Performed:  1.  Contrast injection right heart  2.  Mechanical thrombectomy left upper lobe and left lower lobe pulmonary arteries with the penumbra CAT 8 catheter  3.  Selective catheter placement right main pulmonary artery  4.  Selective catheter placement left upper and lower lobe pulmonary arteries  5.  Inferior venacavogram  6.  Placement of an argon option Elite IVC filter    Anesthesia: Conscious sedation was administered under my direct supervision by the interventional radiology RN. IV Versed plus fentanyl were utilized. Continuous ECG, pulse oximetry and blood pressure was monitored throughout the entire procedure.  Versed and fentanyl were administered intravenously.  Conscious sedation was administered for a total of 45 minutes using 1.5 mg of Versed and 75 mcg of Fentanyl.  EBL: 175 cc  Sheath: 11 French right femoral vein  Contrast: 75 cc   Fluoroscopy Time: 13.7 minutes  Indications:  Patient presents with pulmonary emboli. The patient is symptomatic with hypoxemia and dyspnea on exertion.  There is evidence of right heart strain on the CT angiogram. The patient is otherwise a good candidate for intervention and even the long-term benefits pulmonary angiography with thrombolysis is offered. The risks and benefits are reviewed long-term benefits are discussed. All questions are answered patient agrees to proceed.  Procedure:  Terri Hittle Thompsonis a 86 y.o. female who was identified and appropriate procedural time out was performed.  The patient was then placed supine on the table and prepped and draped in the usual sterile fashion.  Ultrasound was used to evaluate the right common femoral vein.  It was patent, as it  was echolucent and compressible.  A digital ultrasound image was acquired for the permanent record.  A Seldinger needle was used to access the right common femoral vein under direct ultrasound guidance.  A 0.035 J wire was advanced without resistance and a 5Fr sheath was placed and then a ProGlide device was placed and then we upsized to an 11 [place] sheath.    The wire and pigtail catheter were then negotiated into the right atrium and bolus injection of contrast was utilized to demonstrate the right ventricle and the pulmonary artery outflow. The wire and catheter were then negotiated into the main pulmonary artery where hand injection of contrast was utilized to demonstrate the pulmonary arteries and confirm the locations of the pulmonary emboli.  The JR4 catheter was advanced first into the right main pulmonary artery.  Since her scan had been 6 days ago, imaging was prudent to make sure that no thrombus was seen at this time.  Imaging demonstrated no significant thrombus burden in the right main or lobar pulmonary arteries.  I then flipped the [place] catheter over to the left side and cannulated first left upper lobe and then the left lower lobe pulmonary arteries. Imaging demonstrated a small amount of thrombus in the left upper lobe pulmonary artery and the left lower lobe pulmonary artery.  3000 Units of heparin was then given and allowed to circulate.  The Penumbra Cat 8 catheter was then advanced up into the pulmonary vasculature. The left lung was addressed. Catheter was negotiated into the left upper lobe pulmonary artery and mechanical thrombectomy was performed using the separator. Follow-up imaging demonstrated a good result and therefore the catheter was renegotiated  into the left lower lobe pulmonary artery and again mechanical thrombectomy was performed. Passes were made with both the Penumbra catheter itself as well as introducing the separator. Follow-up imaging was then performed. I then  performed imaging through the right femoral sheath demonstrating the right iliac vein to be small in the external iliac vein but patent in the common iliac vein and IVC.  I then advanced the delivery system for the argon option Elite IVC filter.  The renal veins were about the level of L1 although she was very kyphotic.  The IVC filter was then deployed at the level of L2 and the delivery system and Magic torque wire was removed.  After review these images wires were reintroduced and the catheters removed. Then, the Pro-glide was secured and sheath is then pulled and pressures held.     Findings:   Right heart imaging:  Right atrium and right ventricle and the pulmonary outflow tract appears fairly normal  Right lung: Imaging demonstrated no thrombus throughout the right main or lobar pulmonary arteries.  Left lung: Imaging demonstrated a small amount of thrombus in the left upper lobe pulmonary artery and the left lower lobe pulmonary artery.    Disposition: Patient was taken to the recovery room in stable condition having tolerated the procedure well.  [name] 05/08/2022,5:22 PM

## 2022-05-08 NOTE — Progress Notes (Signed)
PULMONOLOGY         Date: 05/08/2022,   MRN# [id number] [name] 10/24/1935     AdmissionWeight: 49.4 kg                 CurrentWeight: 49.4 kg  Referring provider: [name]   CHIEF COMPLAINT:   Acute on chronic hypoxemic respiratory failure   HISTORY OF PRESENT ILLNESS   This is a pleasant 86 yo F with hx of UC, ILD with Pulmonary fibrosis, chronic pain syndrome, who came in due to acute on chronic hypoxemia. She reports increased O2 requirement at home with worsening cough, she denies flu like illness or fevers.  Labwork reveals elevated BNP   05/02/22 - patient is improved clinically, RVP is negative , does not appear to have ongoing infection s/p ID eval with de-escalation of antimicrobials. Placed on eliquis for acute PE with mild clot burden.  CRP and ESR was not collected and I have reordered this. She is nontachypneic during exam speaking in full sentences. Mild hypokalemia on labs has been repleted and repleted magnesium as well.    05/04/22- Patient is a bit uncomfortable this am. She reports worsening diarreah and abd discomfort.  She had 5 loose stools overnight. She has not been able to wean down much from yesterday on O2. We repleted her K but due to GI losses she has hypokalemia still.  Will obtain pharmacy consult to help with ongong electrolyte shift.  Reduced steroids to 60IV once daily, have added TID scheduled tussinex 2.71mL to help with diarreah since we think its non-infectious.  CXR done and reviewed this AM , there are diffuse bilateral infiltrates but improved from rior.   05/07/22- patient had several bloody episodes of loose stools. She thinks today this is slightly better. Her inflammatory biomarkers are still elevated. Her overall presentation seems like a UC patient with COVID19 infection, I wonder if she has an undetectable strain with current testing platform.  O2 requirement is unchanged from 2 days ago.  Im considering vascular surgery  consult for thromectomy evaluation since we have not had much more improvemetn over past 48h and unable to use blood thinners any longer due to onging GI bleeds with UC. Fungitell is still inprocess , remainder of infectiuos workup is negative. I have reduced steroids today. To 40mg  solumedrol  05/08/22-patient is improved on 10L/min [place]. She has thrombectomy planned for today. Will keep solumedrol at 40mg  IV daily. She reports feeling better and is smiling during my evaluation.  Husband at bedside we reivewed medical plan.   PAST MEDICAL HISTORY   Past Medical History:  Diagnosis Date   Altered mental state    Anginal pain (HCC)    Anxiety    Basal cell carcinoma 05/03/2021   right neck postauricular - Excised 06/12/21   BMI 30.0-30.9,adult    Cellulitis    Chest pain, atypical    Compression fracture of L4 lumbar vertebra    Constipation    COVID-19 virus infection 05/28/2020   Cystitis    Cystocele    Diverticulosis    Fatigue    Fibrosis, idiopathic pulmonary (HCC)    Gastroenteritis    History of back surgery    History of herniated intervertebral disc    HTN (hypertension)    Hypercholesteremia    Hypocalcemia    Incontinence of urine    OA (osteoarthritis)    shoulder and back   Obesity    Osteopenia    Pneumonia  Shortness of breath    Sleep apnea    Squamous cell carcinoma of skin 07/16/2016   Right upper lateral eyebrow. SCCis.   Squamous cell carcinoma of skin 01/13/2018   Left temporal hairline. WD SCC with superficial infiltration.   Thrush    Vitamin D deficiency      SURGICAL HISTORY   Past Surgical History:  Procedure Laterality Date   BACK SURGERY     CATARACT EXTRACTION W/PHACO Left 04/24/2016   Procedure: CATARACT EXTRACTION PHACO AND INTRAOCULAR LENS PLACEMENT (IOC);  Surgeon: Lockie Mola, MD;  Location: [facility];  Service: Ophthalmology;  Laterality: Left;   CATARACT EXTRACTION W/PHACO Right 07/14/2017   Procedure: CATARACT  EXTRACTION PHACO AND INTRAOCULAR LENS PLACEMENT (IOC)  RIGHT;  Surgeon: Lockie Mola, MD;  Location: [facility];  Service: Ophthalmology;  Laterality: Right;   CHOLECYSTECTOMY     COLONOSCOPY WITH PROPOFOL N/A 02/01/2022   Procedure: COLONOSCOPY WITH PROPOFOL;  Surgeon: Wyline Mood, MD;  Location: [facility] ENDOSCOPY;  Service: Gastroenterology;  Laterality: N/A;   CYSTOSCOPY/URETEROSCOPY/HOLMIUM LASER/STENT PLACEMENT Left 04/27/2021   Procedure: CYSTOSCOPY/URETEROSCOPY/HOLMIUM LASER/STENT PLACEMENT;  Surgeon: Sondra Come, MD;  Location: [facility] ORS;  Service: Urology;  Laterality: Left;   ESOPHAGOGASTRODUODENOSCOPY (EGD) WITH PROPOFOL N/A 10/13/2018   Procedure: ESOPHAGOGASTRODUODENOSCOPY (EGD) WITH PROPOFOL;  Surgeon: Wyline Mood, MD;  Location: [facility] ENDOSCOPY;  Service: Gastroenterology;  Laterality: N/A;   ESOPHAGOGASTRODUODENOSCOPY (EGD) WITH PROPOFOL N/A 11/22/2021   Procedure: ESOPHAGOGASTRODUODENOSCOPY (EGD) WITH PROPOFOL;  Surgeon: Wyline Mood, MD;  Location: [facility] ENDOSCOPY;  Service: Gastroenterology;  Laterality: N/A;   EYE SURGERY     FLEXIBLE SIGMOIDOSCOPY N/A 10/13/2018   Procedure: FLEXIBLE SIGMOIDOSCOPY;  Surgeon: Wyline Mood, MD;  Location: [facility] ENDOSCOPY;  Service: Gastroenterology;  Laterality: N/A;   FLEXIBLE SIGMOIDOSCOPY N/A 09/09/2019   Procedure: FLEXIBLE SIGMOIDOSCOPY;  Surgeon: Wyline Mood, MD;  Location: [facility] ENDOSCOPY;  Service: Gastroenterology;  Laterality: N/A;   FLEXIBLE SIGMOIDOSCOPY N/A 08/04/2020   Procedure: FLEXIBLE SIGMOIDOSCOPY;  Surgeon: Wyline Mood, MD;  Location: [facility] END[facility]COPY;  Service: Gastroenterology;  Laterality: N/A;  Per Dr. Tobi Bastos, unsedated   FLEXIBLE SIGMOIDOSCOPY N/A 08/31/2021   Procedure: FLEXIBLE SIGMOIDOSCOPY;  Surgeon: Wyline Mood, MD;  Location: [facility] ENDOSCOPY;  Service: Gastroenterology;  Laterality: N/A;  no sedation   HIP ARTHROPLASTY Right 09/27/2020   Procedure: ARTHROPLASTY BIPOLAR HIP (HEMIARTHROPLASTY);  Surgeon: Kennedy Bucker, MD;  Location: [facility] ORS;  Service: Orthopedics;  Laterality: Right;   JOINT REPLACEMENT     ROTATOR CUFF REPAIR Bilateral    left-12/2009; right-03/2009 dr.califf   TONSILLECTOMY     TOTAL SHOULDER REPLACEMENT Left      FAMILY HISTORY   Family History  Problem Relation Age of Onset   COPD Mother    Cancer Brother        colon     SOCIAL HISTORY   Social History   Tobacco Use   Smoking status: Never   Smokeless tobacco: Never  Vaping Use   Vaping Use: Never used  Substance Use Topics   Alcohol use: No   Drug use: No     MEDICATIONS    Home Medication:     Current Medication:  Current Facility-Administered Medications:    0.9 %  sodium chloride infusion, 250 mL, Intravenous, PRN, Wouk, Wilfred Curtis, MD   acidophilus (RISAQUAD) capsule 2 capsule, 2 capsule, Oral, TID, Lowella Bandy, RPH, 2 capsule at 05/08/22 1006   albuterol (PROVENTIL) (2.5 MG/3ML) 0.083% nebulizer solution 2.5 mg, 2.5 mg, Nebulization, TID, Darlin Priestly, MD, 2.5 mg  at 05/08/22 1348   Chlorhexidine Gluconate Cloth 2 % PADS 6 each, 6 each, Topical, Daily, Darlin Priestly, MD, 6 each at 05/08/22 1319   citalopram (CELEXA) tablet 10 mg, 10 mg, Oral, Daily, Darlin Priestly, MD, 10 mg at 05/08/22 1007   feeding supplement (ENSURE ENLIVE / ENSURE PLUS) liquid 237 mL, 237 mL, Oral, BID BM, Darlin Priestly, MD, 237 mL at 05/07/22 1450   methylPREDNISolone sodium succinate (SOLU-MEDROL) 40 mg/mL injection 40 mg, 40 mg, Intravenous, Q24H, Timberlee Roblero, MD, 40 mg at 05/08/22 1319   sodium chloride flush (NS) 0.9 % injection 3 mL, 3 mL, Intravenous, Q12H, Wouk, Wilfred Curtis, MD, 3 mL at 05/08/22 1000   sodium chloride flush (NS) 0.9 % injection 3 mL, 3 mL, Intravenous, PRN, Wouk, Wilfred Curtis, MD    ALLERGIES   Patient has no known allergies.     REVIEW OF SYSTEMS    Review of Systems:  Gen:  Denies  fever, sweats, chills weigh loss  HEENT: Denies blurred vision, double vision, ear pain, eye pain,  hearing loss, nose bleeds, sore throat Cardiac:  No dizziness, chest pain or heaviness, chest tightness,edema Resp:   reports dyspnea chronically  Gi: Denies swallowing difficulty, stomach pain, nausea or vomiting, diarrhea, constipation, bowel incontinence Gu:  Denies bladder incontinence, burning urine Ext:   Denies Joint pain, stiffness or swelling Skin: Denies  skin rash, easy bruising or bleeding or hives Endoc:  Denies polyuria, polydipsia , polyphagia or weight change Psych:   Denies depression, insomnia or hallucinations   Other:  All other systems negative   VS: BP (!) 166/66   Pulse 90   Temp 98.2 F (36.8 C) (Oral)   Resp 18   Ht 4\' 10"  (1.473 m)   Wt 49.4 kg   SpO2 94%   BMI 22.78 kg/m      PHYSICAL EXAM    GENERAL:NAD, no fevers, chills, no weakness no fatigue HEAD: Normocephalic, atraumatic.  EYES: Pupils equal, round, reactive to light. Extraocular muscles intact. No scleral icterus.  MOUTH: Moist mucosal membrane. Dentition intact. No abscess noted.  EAR, NOSE, THROAT: Clear without exudates. No external lesions.  NECK: Supple. No thyromegaly. No nodules. No JVD.  PULMONARY: decreased breath sounds with mild rhonchi worse at bases bilaterally.  CARDIOVASCULAR: S1 and S2. Regular rate and rhythm. No murmurs, rubs, or gallops. No edema. Pedal pulses 2+ bilaterally.  GASTROINTESTINAL: Soft, nontender, nondistended. No masses. Positive bowel sounds. No hepatosplenomegaly.  MUSCULOSKELETAL: No swelling, clubbing, or edema. Range of motion full in all extremities.  NEUROLOGIC: Cranial nerves II through XII are intact. No gross focal neurological deficits. Sensation intact. Reflexes intact.  SKIN: No ulceration, lesions, rashes, or cyanosis. Skin warm and dry. Turgor intact.  PSYCHIATRIC: Mood, affect within normal limits. The patient is awake, alert and oriented x 3. Insight, judgment intact.       IMAGING     ASSESSMENT/PLAN   Acute on chronic hypoxemic  respiratory failure    - abnormal CT chest with interval development of inflammatory changes as evidenced with serial CT chest above   - possible overlying interstitial edema   - RVP is negative and remainder of micro is negative   - ESR/CRP trending  -currently on solumedrol 40 mg IV  - the situation is very difficult because she benefits from steroids with ulcerative colitis and ILD but it potentiates infection including Cdiff so therapy is further complicated   - stopping diuresis due to GI losses   - colitis seems  to be mostly inflammatory , appreciate ID evaluation     Pulmonary fibrosis - chronic   - on esbriet - please continue as current  -PT /OT as able             Thank you for allowing me to participate in the care of this patient.   Patient/Family are satisfied with care plan and all questions have been answered.    Provider disclosure: Patient with at least one acute or chronic illness or injury that poses a threat to life or bodily function and is being managed actively during this encounter.  All of the below services have been performed independently by signing provider:  review of prior documentation from internal and or external health records.  Review of previous and current lab results.  Interview and comprehensive assessment during patient visit today. Review of current and previous chest radiographs/CT scans. Discussion of management and test interpretation with health care team and patient/family.   This document was prepared using Dragon voice recognition software and may include unintentional dictation errors.     Vida Rigger, M.D.  Division of Pulmonary & Critical Care Medicine

## 2022-05-09 ENCOUNTER — Encounter: Payer: Self-pay | Admitting: Vascular Surgery

## 2022-05-09 ENCOUNTER — Inpatient Hospital Stay: Payer: Medicare HMO

## 2022-05-09 DIAGNOSIS — J9601 Acute respiratory failure with hypoxia: Secondary | ICD-10-CM | POA: Diagnosis not present

## 2022-05-09 DIAGNOSIS — J189 Pneumonia, unspecified organism: Secondary | ICD-10-CM | POA: Diagnosis not present

## 2022-05-09 DIAGNOSIS — I2699 Other pulmonary embolism without acute cor pulmonale: Secondary | ICD-10-CM | POA: Diagnosis not present

## 2022-05-09 DIAGNOSIS — J84112 Idiopathic pulmonary fibrosis: Secondary | ICD-10-CM | POA: Diagnosis not present

## 2022-05-09 LAB — TYPE AND SCREEN
ABO/RH(D): A NEG
Antibody Screen: NEGATIVE
Unit division: 0

## 2022-05-09 LAB — BPAM RBC
Blood Product Expiration Date: 202310022359
ISSUE DATE / TIME: 202309061411
Unit Type and Rh: 600

## 2022-05-09 LAB — BASIC METABOLIC PANEL WITH GFR
Anion gap: 11 (ref 5–15)
BUN: 25 mg/dL — ABNORMAL HIGH (ref 8–23)
CO2: 34 mmol/L — ABNORMAL HIGH (ref 22–32)
Calcium: 9 mg/dL (ref 8.9–10.3)
Chloride: 94 mmol/L — ABNORMAL LOW (ref 98–111)
Creatinine, Ser: 1.06 mg/dL — ABNORMAL HIGH (ref 0.44–1.00)
GFR, Estimated: 52 mL/min — ABNORMAL LOW (ref >60)
Glucose, Bld: 109 mg/dL — ABNORMAL HIGH (ref 70–99)
Potassium: 4.6 mmol/L (ref 3.5–5.1)
Sodium: 139 mmol/L (ref 135–145)

## 2022-05-09 LAB — C-REACTIVE PROTEIN: CRP: 2 mg/dL — ABNORMAL HIGH (ref <1.0)

## 2022-05-09 LAB — MAGNESIUM: Magnesium: 2 mg/dL (ref 1.7–2.4)

## 2022-05-09 MED ORDER — ACETAMINOPHEN 325 MG PO TABS
650.0000 mg | ORAL_TABLET | Freq: Four times a day (QID) | ORAL | Status: DC | PRN
  Administered 2022-05-10 – 2022-05-22 (×6): 650 mg via ORAL
  Filled 2022-05-09 (×6): qty 2

## 2022-05-09 MED ORDER — PANTOPRAZOLE SODIUM 40 MG IV SOLR
40.0000 mg | Freq: Two times a day (BID) | INTRAVENOUS | Status: DC
  Administered 2022-05-09 – 2022-05-15 (×13): 40 mg via INTRAVENOUS
  Filled 2022-05-09 (×13): qty 10

## 2022-05-09 MED ORDER — FUROSEMIDE 10 MG/ML IJ SOLN
40.0000 mg | Freq: Once | INTRAMUSCULAR | Status: AC
  Administered 2022-05-09: 40 mg via INTRAVENOUS
  Filled 2022-05-09: qty 4

## 2022-05-09 MED ORDER — ALBUTEROL SULFATE (2.5 MG/3ML) 0.083% IN NEBU
2.5000 mg | INHALATION_SOLUTION | RESPIRATORY_TRACT | Status: AC
  Administered 2022-05-09: 2.5 mg via RESPIRATORY_TRACT
  Filled 2022-05-09: qty 3

## 2022-05-09 MED ORDER — ALBUTEROL SULFATE (2.5 MG/3ML) 0.083% IN NEBU: 2.5000 mg | INHALATION_SOLUTION | Freq: Once | RESPIRATORY_TRACT | Status: DC

## 2022-05-09 MED ORDER — VITAMIN B-12 1000 MCG PO TABS
1000.0000 ug | ORAL_TABLET | ORAL | Status: DC
  Administered 2022-05-09 – 2022-05-23 (×8): 1000 ug via ORAL
  Filled 2022-05-09 (×8): qty 1

## 2022-05-09 MED ORDER — DICLOFENAC SODIUM 1 % EX GEL: 2.0000 g | Freq: Three times a day (TID) | CUTANEOUS | Status: DC | PRN

## 2022-05-09 MED ORDER — STERILE WATER FOR INJECTION IJ SOLN
INTRAMUSCULAR | Status: AC
  Administered 2022-05-09: 1 mL
  Filled 2022-05-09: qty 10

## 2022-05-09 MED ORDER — LEVALBUTEROL HCL 0.63 MG/3ML IN NEBU
0.6300 mg | INHALATION_SOLUTION | Freq: Three times a day (TID) | RESPIRATORY_TRACT | Status: DC
  Administered 2022-05-09 – 2022-05-20 (×31): 0.63 mg via RESPIRATORY_TRACT
  Filled 2022-05-09 (×36): qty 3

## 2022-05-09 NOTE — Progress Notes (Signed)
Patient now on 15L HFNC. A5t the beginning of the shift she was on 10L HFNC then at 0100 she was placed on 12L HFNC. [place] NP notified.

## 2022-05-09 NOTE — Progress Notes (Signed)
[facility] Inpatient Progress Note    Subjective: Patient seen for recurrent GI bleeding; s/p left pulmonary artery thrombectomy and IVC filter placement yesterday by Dr. Lucky Cowboy. I was notified by Dr. Enzo Bi that patient had significant drop in Hgb from hematochezia seemingly related to patient history of UC treated with Entyvio by Dr. [name] of  GI. It appears that patient experienced significant respiratory distress very early this AM requiring CXR showing low lung volumes but no pulmonary edema, infusion. Mild atelectasis noted related to pneumonia. Antibiotics discontinued. Patient on steroids for pulmonary issues.  Objective: Vital signs in last 24 hours: Temp:  [97.7 F (36.5 C)-98.2 F (36.8 C)] 98 F (36.7 C) (09/07 0833) Pulse Rate:  [0-90] 75 (09/07 0833) Resp:  [12-33] 20 (09/07 0833) BP: (93-166)/(54-94) 108/61 (09/07 0833) SpO2:  [83 %-100 %] 93 % (09/07 0833) Blood pressure 108/61, pulse 75, temperature 98 F (36.7 C), temperature source Oral, resp. rate 20, height 4' 10"  (1.473 m), weight 49.4 kg, SpO2 93 %.    Intake/Output from previous day: 09/06 0701 - 09/07 0700 In: 491.1 [I.V.:131.1; Blood:360] Out: 375 [Urine:200; Blood:175]  Intake/Output this shift: No intake/output data recorded.   Gen: NAD. Appears uncomfortable. Nasal prongs with high flow oxygen present.   HEENT: [place]/AT. PERRLA. Normal external ear exam.  Chest: CTA, no wheezes.  CV: RR nl S1, S2. No gallops.  Abd: soft, nt, nd. BS+  Ext: no edema. Pulses 2+  Neuro: Alert and oriented. Judgement appears normal. Nonfocal.   Lab Results: Results for orders placed or performed during the hospital encounter of 05/01/22 (from the past 24 hour(s))  Prepare RBC (crossmatch)     Status: None   Collection Time: 05/08/22 11:30 AM  Result Value Ref Range   Order Confirmation      ORDER PROCESSED BY BLOOD BANK Performed at [facility], [address]  [place], [place] [address]   Type and screen [place]     Status: None (Preliminary result)   Collection Time: 05/08/22 11:35 AM  Result Value Ref Range   ABO/RH(D) A NEG    Antibody Screen NEG    Sample Expiration 05/11/2022,2359    Unit Number [id number]    Blood Component Type RED CELLS,LR    Unit division 00    Status of Unit ISSUED    Transfusion Status OK TO TRANSFUSE    Crossmatch Result      Compatible Performed at [facility], [address] [place], [place] [address]   Hemoglobin and hematocrit, blood     Status: Abnormal   Collection Time: 05/08/22  6:37 PM  Result Value Ref Range   Hemoglobin 10.3 (L) 12.0 - 15.0 g/dL   HCT 31.1 (L) 36.0 - 37.6 %  Basic metabolic panel     Status: Abnormal   Collection Time: 05/09/22  8:24 AM  Result Value Ref Range   Sodium 139 135 - 145 mmol/L   Potassium 4.6 3.5 - 5.1 mmol/L   Chloride 94 (L) 98 - 111 mmol/L   CO2 34 (H) 22 - 32 mmol/L   Glucose, Bld 109 (H) 70 - 99 mg/dL   BUN 25 (H) 8 - 23 mg/dL   Creatinine, Ser 1.06 (H) 0.44 - 1.00 mg/dL   Calcium 9.0 8.9 - 10.3 mg/dL   GFR, Estimated 52 (L) >60 mL/min   Anion gap 11 5 - 15  Magnesium     Status: None   Collection Time: 05/09/22  8:24 AM  Result Value Ref Range   Magnesium 2.0 1.7 - 2.4 mg/dL     Recent Labs    05/07/22 0438 05/08/22 0536 05/08/22 1837  WBC 10.5 9.4  --   HGB 8.7* 7.8* 10.3*  HCT 27.5* 24.9* 31.1*  PLT 373 380  --    BMET Recent Labs    05/07/22 0438 05/08/22 0536 05/09/22 0824  NA 135 134* 139  K 3.7 4.0 4.6  CL 97* 96* 94*  CO2 30 31 34*  GLUCOSE 123* 202* 109*  BUN 46* 33* 25*  CREATININE 1.24* 1.03* 1.06*  CALCIUM 8.3* 8.2* 9.0   LFT No results for input(s): "PROT", "ALBUMIN", "AST", "ALT", "ALKPHOS", "BILITOT", "BILIDIR", "IBILI" in the last 72 hours. PT/INR No results for input(s): "LABPROT", "INR" in the last 72 hours. Hepatitis Panel No results for input(s): "HEPBSAG", "HCVAB",  "HEPAIGM", "HEPBIGM" in the last 72 hours. C-Diff No results for input(s): "CDIFFTOX" in the last 72 hours. No results for input(s): "CDIFFPCR" in the last 72 hours.   Studies/Results: DG Chest Port 1 View  Result Date: 05/09/2022 CLINICAL DATA:  Hypoxia. EXAM: PORTABLE CHEST 1 VIEW COMPARISON:  05/07/2022 FINDINGS: Persistent low lung volumes. Stable cardiomediastinal contours. Bilateral interstitial and airspace opacities are unchanged in the interval. This appears unchanged from the previous exam. Status post left shoulder arthroplasty. IVC filter. Kyphoplasty has been performed at T12 and L1. IMPRESSION: 1. No change in aeration to the lungs compared with previous exam. 2. Persistent low lung volumes. Electronically Signed   By: Kerby Moors M.D.   On: 05/09/2022 05:06   PERIPHERAL VASCULAR CATHETERIZATION  Result Date: 05/08/2022 See surgical note for result.   Scheduled Inpatient Medications:    acidophilus  2 capsule Oral TID   albuterol  2.5 mg Nebulization TID   Chlorhexidine Gluconate Cloth  6 each Topical Daily   citalopram  10 mg Oral Daily   feeding supplement  237 mL Oral BID BM   methylPREDNISolone (SOLU-MEDROL) injection  40 mg Intravenous Q24H   sodium chloride flush  3 mL Intravenous Q12H    Continuous Inpatient Infusions:    sodium chloride      PRN Inpatient Medications:  sodium chloride, ondansetron (ZOFRAN) IV, sodium chloride flush  Miscellaneous: N/A  Assessment:  Ulcerative colitis with recent bleeding s/p 2 IV infusions of entyvio prior to admission. Eliquis on Hold per medicine. Diarrhea  Melena - ?Resolving lower GI bleeding vs. New UGI bleeding. Interstitial lung disease - Progressive care per Dr. Lanney Gins. Afib with RVR - s/p amiodarone. Now reportedly in NSR.  Plan:  Begin IV protonix bid. Continue pulmonary treatment given marked hypoxia requiring steroids and increased oxygen requirement. May consider EGD/Flex sig when clinically  feasible depending on clinical progress.  Following.   Kemi Gell K. [name], M.D. 05/09/2022, 9:20 AM

## 2022-05-09 NOTE — TOC Progression Note (Signed)
Transition of Care [facility]) - Progression Note    Patient Details  Name: [name] MRN: [id number] Date of Birth: 1936/02/22  Transition of Care [facility]) CM/SW [place], LCSW Phone Number: 05/09/2022, 9:38 AM  Clinical Narrative:  TOC continues to follow for disposition needs.   Expected Discharge Plan: [place] Barriers to Discharge: Continued Medical Work up  Expected Discharge Plan and Services Expected Discharge Plan: [place] Choice: [place] arrangements for the past 2 months: Single Family Home                                       Social Determinants of Health (SDOH) Interventions    Readmission Risk Interventions     No data to display

## 2022-05-09 NOTE — Progress Notes (Signed)
PULMONOLOGY         Date: 05/09/2022,   MRN# [id number] [name] 1935-09-16     AdmissionWeight: 49.4 kg                 CurrentWeight: 49.4 kg  Referring provider: Dr Si Raider   CHIEF COMPLAINT:   Acute on chronic hypoxemic respiratory failure   HISTORY OF PRESENT ILLNESS   This is a pleasant 86 yo F with hx of UC, ILD with Pulmonary fibrosis, chronic pain syndrome, who came in due to acute on chronic hypoxemia. She reports increased O2 requirement at home with worsening cough, she denies flu like illness or fevers.  Labwork reveals elevated BNP   05/02/22 - patient is improved clinically, RVP is negative , does not appear to have ongoing infection s/p ID eval with de-escalation of antimicrobials. Placed on eliquis for acute PE with mild clot burden.  CRP and ESR was not collected and I have reordered this. She is nontachypneic during exam speaking in full sentences. Mild hypokalemia on labs has been repleted and repleted magnesium as well.    05/04/22- Patient is a bit uncomfortable this am. She reports worsening diarreah and abd discomfort.  She had 5 loose stools overnight. She has not been able to wean down much from yesterday on O2. We repleted her K but due to GI losses she has hypokalemia still.  Will obtain pharmacy consult to help with ongong electrolyte shift.  Reduced steroids to 60IV once daily, have added TID scheduled tussinex 2.43m to help with diarreah since we think its non-infectious.  CXR done and reviewed this AM , there are diffuse bilateral infiltrates but improved from rior.   05/07/22- patient had several bloody episodes of loose stools. She thinks today this is slightly better. Her inflammatory biomarkers are still elevated. Her overall presentation seems like a UC patient with COVID19 infection, I wonder if she has an undetectable strain with current testing platform.  O2 requirement is unchanged from 2 days ago.  Im considering vascular surgery  consult for thromectomy evaluation since we have not had much more improvemetn over past 48h and unable to use blood thinners any longer due to onging GI bleeds with UC. Fungitell is still inprocess , remainder of infectiuos workup is negative. I have reduced steroids today. To 465msolumedrol  05/08/22-patient is improved on 10L/min [place]. She has thrombectomy planned for today. Will keep solumedrol at 4039mV daily. She reports feeling better and is smiling during my evaluation.  Husband at bedside we reivewed medical plan.    05/09/22- patient is on HFNC post thrombectomy, she had a setback post operatively may be due to physical stress.  She does have atelectasis on cxr and we have initiated metaneb q8h with saline. Additional covid testing in process due to clinical suspicion of false negative.   PAST MEDICAL HISTORY   Past Medical History:  Diagnosis Date   Altered mental state    Anginal pain ([place]CC[place]  Anxiety    Basal cell carcinoma 05/03/2021   right neck postauricular - Excised 06/12/21   BMI 30.0-30.9,adult    Cellulitis    Chest pain, atypical    Compression fracture of L4 lumbar vertebra    Constipation    COVID-19 virus infection 05/28/2020   Cystitis    Cystocele    Diverticulosis    Fatigue    Fibrosis, idiopathic pulmonary (HCC)    Gastroenteritis    History of back surgery  History of herniated intervertebral disc    HTN (hypertension)    Hypercholesteremia    Hypocalcemia    Incontinence of urine    OA (osteoarthritis)    shoulder and back   Obesity    Osteopenia    Pneumonia    Shortness of breath    Sleep apnea    Squamous cell carcinoma of skin 07/16/2016   Right upper lateral eyebrow. SCCis.   Squamous cell carcinoma of skin 01/13/2018   Left temporal hairline. WD SCC with superficial infiltration.   Thrush    Vitamin D deficiency      SURGICAL HISTORY   Past Surgical History:  Procedure Laterality Date   BACK SURGERY     CATARACT EXTRACTION  W/PHACO Left 04/24/2016   Procedure: CATARACT EXTRACTION PHACO AND INTRAOCULAR LENS PLACEMENT (IOC);  Surgeon: Leandrew Koyanagi, MD;  Location: [place];  Service: Ophthalmology;  Laterality: Left;   CATARACT EXTRACTION W/PHACO Right 07/14/2017   Procedure: CATARACT EXTRACTION PHACO AND INTRAOCULAR LENS PLACEMENT ([place])  RIGHT;  Surgeon: Leandrew Koyanagi, MD;  Location: [place];  Service: Ophthalmology;  Laterality: Right;   CHOLECYSTECTOMY     COLONOSCOPY WITH PROPOFOL N/A 02/01/2022   Procedure: COLONOSCOPY WITH PROPOFOL;  Surgeon: [name], MD;  Location[facility] ENDOSCOPY;  Service: Gastroenterology;  Laterality: N/A;   CYSTOSCOPY/URETEROSCOPY/HOLMIUM LASER/STENT PLACEMENT Left 04/27/2021   Procedure: CYSTOSCOPY/URETEROSCOPY/HOLMIUM LASER/STENT PLACEMENT;  Surgeon: Billey Co, MD;  Location: [facility] ORS;  Service: Urology;  Laterality: Left;   ESOPHAGOGASTRODUODENOSCOPY (EGD) WITH PROPOFOL N/A 10/13/2018   Procedure: ESOPHAGOGASTRODUODENOSCOPY (EGD) WITH PROPOFOL;  Surgeon: [name], MD;  Location: [facility] ENDOSCOPY;  Service: Gastroenterology;  Laterality: N/A;   ESOPHAGOGASTRODUODENOSCOPY (EGD) WITH PROPOFOL N/A 11/22/2021   Procedure: ESOPHAGOGASTRODUODENOSCOPY (EGD) WITH PROPOFOL;  Surgeon: [name], MD;  Location: [facility] ENDOSCOPY;  Service: Gastroenterology;  Laterality: N/A;   EYE SURGERY     FLEXIBLE SIGMOIDOSCOPY N/A 10/13/2018   Procedure: FLEXIBLE SIGMOIDOSCOPY;  Surgeon: [name], MD;  Location: [facility] ENDOSCOPY;  Service: Gastroenterology;  Laterality: N/A;   FLEXIBLE SIGMOIDOSCOPY N/A 09/09/2019   Procedure: FLEXIBLE SIGMOIDOSCOPY;  Surgeon: [name], MD;  Location: [facility] ENDOSCOPY;  Service: Gastroenterology;  Laterality: N/A;   FLEXIBLE SIGMOIDOSCOPY N/A 08/04/2020   Procedure: FLEXIBLE SIGMOIDOSCOPY;  Surgeon: [name], MD;  Location: [facility] ENDOSCOPY;  Service: Gastroenterology;  Laterality: N/A;  Per Dr. Vicente Males, unsedated   [place] N/A 08/31/2021   Procedure: FLEXIBLE SIGMOIDOSCOPY;  Surgeon: [name], MD;  Location: [facility] ENDOSCOPY;  Service: Gastroenterology;  Laterality: N/A;  no sedation   HIP ARTHROPLASTY Right 09/27/2020   Procedure: ARTHROPLASTY BIPOLAR HIP (HEMIARTHROPLASTY);  Surgeon: Hessie Knows, MD;  Location: [facility] ORS;  Service: Orthopedics;  Laterality: Right;   JOINT REPLACEMENT     PULMONARY THROMBECTOMY N/A 05/08/2022   Procedure: PULMONARY THROMBECTOMY;  Surgeon: Algernon Huxley, MD;  Location: [place] CV LAB;  Service: Cardiovascular;  Laterality: N/A;   ROTATOR CUFF REPAIR Bilateral    left-12/2009; right-03/2009 dr.[email or web address]   TONSILLECTOMY     TOTAL SHOULDER REPLACEMENT Left      FAMILY HISTORY   Family History  Problem Relation Age of Onset   COPD Mother    Cancer Brother        colon     SOCIAL HISTORY   Social History   Tobacco Use   Smoking status: Never   Smokeless tobacco: Never  Vaping Use   Vaping Use: Never used  Substance Use Topics   Alcohol use: No   Drug use:  No     MEDICATIONS    Home Medication:     Current Medication:  Current Facility-Administered Medications:    0.9 %  sodium chloride infusion, 250 mL, Intravenous, PRN, Lucky Cowboy, Erskine Squibb, MD   acidophilus (RISAQUAD) capsule 2 capsule, 2 capsule, Oral, TID, Dew, Erskine Squibb, MD, 2 capsule at 05/08/22 2142   albuterol (PROVENTIL) (2.5 MG/3ML) 0.083% nebulizer solution 2.5 mg, 2.5 mg, Nebulization, TID, Lucky Cowboy, Erskine Squibb, MD, 2.5 mg at 05/09/22 0804   Chlorhexidine Gluconate Cloth 2 % PADS 6 each, 6 each, Topical, Daily, Dew, Erskine Squibb, MD, 6 each at 05/08/22 1319   citalopram (CELEXA) tablet 10 mg, 10 mg, Oral, Daily, Dew, Erskine Squibb, MD, 10 mg at 05/08/22 1007   feeding supplement (ENSURE ENLIVE / ENSURE PLUS) liquid 237 mL, 237 mL, Oral, BID BM, Dew, Erskine Squibb, MD, 237 mL at 05/07/22 1450   methylPREDNISolone sodium succinate (SOLU-MEDROL) 40 mg/mL injection 40 mg, 40 mg, Intravenous, Q24H, Dew, Erskine Squibb, MD,  40 mg at 05/08/22 1319   ondansetron (ZOFRAN) injection 4 mg, 4 mg, Intravenous, Q6H PRN, Dew, Erskine Squibb, MD   sodium chloride flush (NS) 0.9 % injection 3 mL, 3 mL, Intravenous, Q12H, Dew, Erskine Squibb, MD, 3 mL at 05/08/22 2142   sodium chloride flush (NS) 0.9 % injection 3 mL, 3 mL, Intravenous, PRN, Lucky Cowboy, Erskine Squibb, MD    ALLERGIES   Patient has no known allergies.     REVIEW OF SYSTEMS    Review of Systems:  Gen:  Denies  fever, sweats, chills weigh loss  HEENT: Denies blurred vision, double vision, ear pain, eye pain, hearing loss, nose bleeds, sore throat Cardiac:  No dizziness, chest pain or heaviness, chest tightness,edema Resp:   reports dyspnea chronically  Gi: Denies swallowing difficulty, stomach pain, nausea or vomiting, diarrhea, constipation, bowel incontinence Gu:  Denies bladder incontinence, burning urine Ext:   Denies Joint pain, stiffness or swelling Skin: Denies  skin rash, easy bruising or bleeding or hives Endoc:  Denies polyuria, polydipsia , polyphagia or weight change Psych:   Denies depression, insomnia or hallucinations   Other:  All other systems negative   VS: BP 105/66 (BP Location: Right Arm)   Pulse 67   Temp 97.9 F (36.6 C) (Oral)   Resp 12   Ht _0  (1.473 m)   Wt 49.4 kg   SpO2 94%   BMI 22.78 kg/m      PHYSICAL EXAM    GENERAL:NAD, no fevers, chills, no weakness no fatigue HEAD: Normocephalic, atraumatic.  EYES: Pupils equal, round, reactive to light. Extraocular muscles intact. No scleral icterus.  MOUTH: Moist mucosal membrane. Dentition intact. No abscess noted.  EAR, NOSE, THROAT: Clear without exudates. No external lesions.  NECK: Supple. No thyromegaly. No nodules. No JVD.  PULMONARY: decreased breath sounds with mild rhonchi worse at bases bilaterally.  CARDIOVASCULAR: S1 and S2. Regular rate and rhythm. No murmurs, rubs, or gallops. No edema. Pedal pulses 2+ bilaterally.  GASTROINTESTINAL: Soft, nontender, nondistended.  No masses. Positive bowel sounds. No hepatosplenomegaly.  MUSCULOSKELETAL: No swelling, clubbing, or edema. Range of motion full in all extremities.  NEUROLOGIC: Cranial nerves II through XII are intact. No gross focal neurological deficits. Sensation intact. Reflexes intact.  SKIN: No ulceration, lesions, rashes, or cyanosis. Skin warm and dry. Turgor intact.  PSYCHIATRIC: Mood, affect within normal limits. The patient is awake, alert and oriented x 3. Insight, judgment intact.       IMAGING  ASSESSMENT/PLAN   Acute on chronic hypoxemic respiratory failure    - abnormal CT chest with interval development of inflammatory changes as evidenced with serial CT chest above   - possible overlying interstitial edema   - RVP is negative and remainder of micro is negative   - ESR/CRP trending  -currently on solumedrol 40 mg IV  - the situation is very difficult because she benefits from steroids with ulcerative colitis and ILD but it potentiates infection including Cdiff so therapy is further complicated   - stopping diuresis due to GI losses   - colitis seems to be mostly inflammatory , appreciate ID evaluation     Pulmonary fibrosis - chronic   - on esbriet - please continue as current  -PT /OT as able             Thank you for allowing me to participate in the care of this patient.   Patient/Family are satisfied with care plan and all questions have been answered.    Provider disclosure: Patient with at least one acute or chronic illness or injury that poses a threat to life or bodily function and is being managed actively during this encounter.  All of the below services have been performed independently by signing provider:  review of prior documentation from internal and or external health records.  Review of previous and current lab results.  Interview and comprehensive assessment during patient visit today. Review of current and previous chest radiographs/CT scans.  Discussion of management and test interpretation with health care team and patient/family.   This document was prepared using Dragon voice recognition software and may include unintentional dictation errors.     Ottie Glazier, M.D.  Division of Pulmonary & Critical Care Medicine

## 2022-05-09 NOTE — Progress Notes (Signed)
       CROSS COVER NOTE  NAME: [name] MRN: [id number] DOB : [birth year]    Date of Service   05/09/2022   HPI/Events of Note   Notified by nursing of increasing oxygen requirement. M(r)s [name]'s O2 increased from 10L-->12L--> 15L overnight.  M(r)s [name] is a 86 yo F with PMH ILD, recurrent c diff, ulcerative colitis, chronic pain, chronic hypoxic respiratory failure who presented to [facility] ED with reports of dyspnea and palpitations. On assessment rhonchi and crackles heard on auscultation of lung fields. She received 1U PRBC yesterday secondary to ongoing bloody stools and home lasix is currently being held.  Interventions   Plan: Albuterol  CXR Lasix 40 mg IV    This document was prepared using Dragon voice recognition software and may include unintentional dictation errors.  Neomia Glass DNP, MHA, FNP-BC Nurse Practitioner [facility] Pager [phone]

## 2022-05-09 NOTE — Progress Notes (Signed)
[place] [place] NAME: [name]    MR#:  [id number]  DATE OF BIRTH:  Aug 18, 1936  SUBJECTIVE:  No family today at bedside.  Patient is oxygen requirement went up to heated high flow nasal cannula 75% of Fio2 with 40 L oxygen patient drops oxygen saturation easily with exertion  VITALS:  Blood pressure 108/61, pulse 75, temperature 98 F (36.7 C), temperature source Oral, resp. rate 20, height 4' 10"  (1.473 m), weight 49.4 kg, SpO2 95 %.  PHYSICAL EXAMINATION:   GENERAL:  86 y.o.-year-old patient lying in the bed with no acute distress. Chronically ill. LUNGS: distant breath sounds bilaterally, no wheezing, rales, rhonchi.  CARDIOVASCULAR: S1, S2 normal. No murmurs, rubs, or gallops. Sinus rhythm ABDOMEN: Soft, nontender, nondistended. Bowel sounds present.  EXTREMITIES: No  edema b/l.    NEUROLOGIC: nonfocal  patient is alert and awake, weak SKIN: per RN  LABORATORY PANEL:  CBC Recent Labs  Lab 05/08/22 0536 05/08/22 1837  WBC 9.4  --   HGB 7.8* 10.3*  HCT 24.9* 31.1*  PLT 380  --      Chemistries  Recent Labs  Lab 05/09/22 0824  NA 139  K 4.6  CL 94*  CO2 34*  GLUCOSE 109*  BUN 25*  CREATININE 1.06*  CALCIUM 9.0  MG 2.0    Cardiac Enzymes No results for input(s): "TROPONINI" in the last 168 hours. RADIOLOGY:  DG Chest Port 1 View  Result Date: 05/09/2022 CLINICAL DATA:  Hypoxia. EXAM: PORTABLE CHEST 1 VIEW COMPARISON:  05/07/2022 FINDINGS: Persistent low lung volumes. Stable cardiomediastinal contours. Bilateral interstitial and airspace opacities are unchanged in the interval. This appears unchanged from the previous exam. Status post left shoulder arthroplasty. IVC filter. Kyphoplasty has been performed at T12 and L1. IMPRESSION: 1. No change in aeration to the lungs compared with previous exam. 2. Persistent low lung volumes. Electronically Signed   By: Kerby Moors M.D.   On: 05/09/2022 05:06    PERIPHERAL VASCULAR CATHETERIZATION  Result Date: 05/08/2022 See surgical note for result.   Assessment and Plan  [name] is a 86 y.o. female with medical history significant for ILD, recurrent c diff, ulcerative colitis, chronic pain, chronic hypoxic respiratory failure, who presented with dyspnea and palpitations.   Reports a decline of several months with acute worsening the days PTA.  Reports having to increase baseline home o2 of 2-3 liters to 4 liters lately, has had several months of hypoxia, and has worsened over past few weeks to being dyspneic at rest worse with minimal ambulation.    Acute on chronic hypoxemic respiratory failure --recently, pt has been on 4-5L home O2.  Pt was put on NRB in the ED with Sats of 94%.  Currently needing 10L. --covid and RVP neg.  likely progressive ILD, pulm edema, and small PE contributing.  Questionable PNA.  Started on tx for all 4 etiologies on admission. --Continue supplemental O2 to keep sats between 88-92%, wean as tolerated -- chest physiotherapy started   ILD Underlying severe ILD that is likely progressing but at least several days of acutely worsening dyspnea.  --started on prednisone 50 mg daily on admission and escalated up to IV solumedrol. --pulm consulted, Dr. Lanney Gins --cont IV solumedrol 40 mg daily, per pulm --Albuterol neb scheduled, per pulm --wean oxygen slowly down   Bloody Diarrhea, worsening --started having more frequent diarrhea starting night of 9/1.   --C diff neg on presentation.  Likely  due to abx pt received during the first 2 days. --GI consult with Dr. [name]-- recommends avoid antidiarrheals, follow-up with Dr. [name].I. after hospital discharge. Patient is also advised to avoid her make schedule dose of Entyvio secondary to community acquired pneumonia --diarrhea noted to be bloody on 9/3 --d/c Eliquis  Acute blood loss anemia --Hgb dropped from 12.5 to 8.7 in 3 days from bloody  diarrhea --Monitor Hgb and transfuse to keep Hgb >8 --hemoglobin 7.8 will give 1 unit of blood transfusion.  --came in with hgb 12.8---7.8--1 unit BT--10.3  Pulmonary embolus, acute Small on CT, doubt it's the main driver for her dyspnea.  Started on Eliquis --d/c Eliquis due to bloody diarrhea --s/p Mechanical thrombectomy and IVC filter per Dr Lucky Cowboy    AKI --Cr went up to 1.68, from baseline of 0.7 --likely 2/2 to dehydration from diarrhea --improved with IVF --received IVF--creat down to 1.0   Afib w RVR --started night of 9/4.  Started on amiodarone gtt due to hypotension --pt remains in SR--will d/c amiodarone gtt for now    Acute on chronic diastolic CHF --pulm edema seen on CT, elevated BNP --started on IV lasix 40 mg daily, d/c'ed on 9/2 --hold further diuretics due to diarrhea --will give prn lasix   CAP, ruled out --started on cefepime on admission --ID consulted, and ruled out PNA, abx d/c'ed after 2 days.   History recurrent C diff --has multiple BM's per day, sometimes loose, but pt said that's her baseline --C diff neg on presentation   Chronic pain - home norco prn   UC On entyvio ever 2 weeks at home --follows Dr Vicente Males   Hypokalemia --likely due to diarrhea --monitor and replete PRN     DVT prophylaxis: SCD/Compression stockings Code Status: Full code  Family Communication: none Level of care: Progressive Dispo:   The patient is from: home  PT/OT consulted     TOTAL TIME TAKING CARE OF THIS PATIENT: 35 minutes.  >50% time spent on counselling and coordination of care  Note: This dictation was prepared with Dragon dictation along with smaller phrase technology. Any transcriptional errors that result from this process are unintentional.  Fritzi Mandes M.D    [facility]   CC: Primary care physician; [name], NP

## 2022-05-10 DIAGNOSIS — J84112 Idiopathic pulmonary fibrosis: Secondary | ICD-10-CM | POA: Diagnosis not present

## 2022-05-10 DIAGNOSIS — J9601 Acute respiratory failure with hypoxia: Secondary | ICD-10-CM | POA: Diagnosis not present

## 2022-05-10 DIAGNOSIS — I2699 Other pulmonary embolism without acute cor pulmonale: Secondary | ICD-10-CM | POA: Diagnosis not present

## 2022-05-10 DIAGNOSIS — Z515 Encounter for palliative care: Secondary | ICD-10-CM

## 2022-05-10 DIAGNOSIS — J189 Pneumonia, unspecified organism: Secondary | ICD-10-CM | POA: Diagnosis not present

## 2022-05-10 MED ORDER — BOOST / RESOURCE BREEZE PO LIQD CUSTOM
1.0000 | Freq: Three times a day (TID) | ORAL | Status: DC
  Administered 2022-05-11 – 2022-05-18 (×12): 1 via ORAL

## 2022-05-10 MED ORDER — STERILE WATER FOR INJECTION IJ SOLN
INTRAMUSCULAR | Status: AC
  Administered 2022-05-10: 1 mL
  Filled 2022-05-10: qty 10

## 2022-05-10 MED ORDER — CALCIUM CARBONATE ANTACID 500 MG PO CHEW: 1.0000 | CHEWABLE_TABLET | Freq: Four times a day (QID) | ORAL | Status: DC | PRN

## 2022-05-10 NOTE — Progress Notes (Signed)
Palliative: Thank you for this consult. Unfortunately due to high volume of consults there will be a delay in a Palliative Provider seeing this patient. Palliative Medicine will return to service on 05/13/2022 and will see patient at that time.  No charge Quinn Axe, NP Palliative Medicine PMT phone [phone]. For individual providers please see AMION.

## 2022-05-10 NOTE — Progress Notes (Signed)
PULMONOLOGY         Date: 05/10/2022,   MRN# [id number] [name] 02-05-1936     AdmissionWeight: 49.4 kg                 CurrentWeight: 49.4 kg  Referring provider: [name]   CHIEF COMPLAINT:   Acute on chronic hypoxemic respiratory failure   HISTORY OF PRESENT ILLNESS   This is a pleasant 86 yo F with hx of UC, ILD with Pulmonary fibrosis, chronic pain syndrome, who came in due to acute on chronic hypoxemia. She reports increased O2 requirement at home with worsening cough, she denies flu like illness or fevers.  Labwork reveals elevated BNP   05/02/22 - patient is improved clinically, RVP is negative , does not appear to have ongoing infection s/p ID eval with de-escalation of antimicrobials. Placed on eliquis for acute PE with mild clot burden.  CRP and ESR was not collected and I have reordered this. She is nontachypneic during exam speaking in full sentences. Mild hypokalemia on labs has been repleted and repleted magnesium as well.    05/04/22- Patient is a bit uncomfortable this am. She reports worsening diarreah and abd discomfort.  She had 5 loose stools overnight. She has not been able to wean down much from yesterday on O2. We repleted her K but due to GI losses she has hypokalemia still.  Will obtain pharmacy consult to help with ongong electrolyte shift.  Reduced steroids to 60IV once daily, have added TID scheduled tussinex 2.57mL to help with diarreah since we think its non-infectious.  CXR done and reviewed this AM , there are diffuse bilateral infiltrates but improved from rior.   05/07/22- patient had several bloody episodes of loose stools. She thinks today this is slightly better. Her inflammatory biomarkers are still elevated. Her overall presentation seems like a UC patient with COVID19 infection, I wonder if she has an undetectable strain with current testing platform.  O2 requirement is unchanged from 2 days ago.  Im considering vascular surgery  consult for thromectomy evaluation since we have not had much more improvemetn over past 48h and unable to use blood thinners any longer due to onging GI bleeds with UC. Fungitell is still inprocess , remainder of infectiuos workup is negative. I have reduced steroids today. To 40mg  solumedrol  05/08/22-patient is improved on 10L/min [place]. She has thrombectomy planned for today. Will keep solumedrol at 40mg  IV daily. She reports feeling better and is smiling during my evaluation.  [name] at bedside we reivewed medical plan.    05/09/22- patient is on HFNC post thrombectomy, she had a setback post operatively may be due to physical stress.  She does have atelectasis on cxr and we have initiated metaneb q8h with saline. Additional covid testing in process due to clinical suspicion of false negative.   05/10/22- patient is improved slightly and has been weaned from HFNC from 75% to 65%.  We changed code status to DNR today.   PAST MEDICAL HISTORY   Past Medical History:  Diagnosis Date   Altered mental state    Anginal pain (HCC)    Anxiety    Basal cell carcinoma 05/03/2021   right neck postauricular - Excised 06/12/21   BMI 30.0-30.9,adult    Cellulitis    Chest pain, atypical    Compression fracture of L4 lumbar vertebra    Constipation    COVID-19 virus infection 05/28/2020   Cystitis    Cystocele  Diverticulosis    Fatigue    Fibrosis, idiopathic pulmonary (HCC)    Gastroenteritis    History of back surgery    History of herniated intervertebral disc    HTN (hypertension)    Hypercholesteremia    Hypocalcemia    Incontinence of urine    OA (osteoarthritis)    shoulder and back   Obesity    Osteopenia    Pneumonia    Shortness of breath    Sleep apnea    Squamous cell carcinoma of skin 07/16/2016   Right upper lateral eyebrow. SCCis.   Squamous cell carcinoma of skin 01/13/2018   Left temporal hairline. WD SCC with superficial infiltration.   Thrush    Vitamin D deficiency       SURGICAL HISTORY   Past Surgical History:  Procedure Laterality Date   BACK SURGERY     CATARACT EXTRACTION W/PHACO Left 04/24/2016   Procedure: CATARACT EXTRACTION PHACO AND INTRAOCULAR LENS PLACEMENT (IOC);  Surgeon: Lockie Mola, MD;  Location: [facility];  Service: Ophthalmology;  Laterality: Left;   CATARACT EXTRACTION W/PHACO Right 07/14/2017   Procedure: CATARACT EXTRACTION PHACO AND INTRAOCULAR LENS PLACEMENT (IOC)  RIGHT;  Surgeon: Lockie Mola, MD;  Location: [facility];  Service: Ophthalmology;  Laterality: Right;   CHOLECYSTECTOMY     COLONOSCOPY WITH PROPOFOL N/A 02/01/2022   Procedure: COLONOSCOPY WITH PROPOFOL;  Surgeon: Wyline Mood, MD;  Location: [facility] ENDOSCOPY;  Service: Gastroenterology;  Laterality: N/A;   CYSTOSCOPY/URETEROSCOPY/HOLMIUM LASER/STENT PLACEMENT Left 04/27/2021   Procedure: CYSTOSCOPY/URETEROSCOPY/HOLMIUM LASER/STENT PLACEMENT;  Surgeon: Sondra Come, MD;  Location: [facility] ORS;  Service: Urology;  Laterality: Left;   ESOPHAGOGASTRODUODENOSCOPY (EGD) WITH PROPOFOL N/A 10/13/2018   Procedure: ESOPHAGOGASTRODUODENOSCOPY (EGD) WITH PROPOFOL;  Surgeon: Wyline Mood, MD;  Location: [facility] ENDOSCOPY;  Service: Gastroenterology;  Laterality: N/A;   ESOPHAGOGASTRODUODENOSCOPY (EGD) WITH PROPOFOL N/A 11/22/2021   Procedure: ESOPHAGOGASTRODUODENOSCOPY (EGD) WITH PROPOFOL;  Surgeon: Wyline Mood, MD;  Location: [facility] ENDOSCOPY;  Service: Gastroenterology;  Laterality: N/A;   EYE SURGERY     FLEXIBLE SIGMOIDOSCOPY N/A 10/13/2018   Procedure: FLEXIBLE SIGMOIDOSCOPY;  Surgeon: Wyline Mood, MD;  Location: [facility] ENDOSCOPY;  Service: Gastroenterology;  Laterality: N/A;   FLEXIBLE SIGMOIDOSCOPY N/A 09/09/2019   Procedure: FLEXIBLE SIGMOIDOSCOPY;  Surgeon: Wyline Mood, MD;  Location: [facility] ENDOSCOPY;  Service: Gastroenterology;  Laterality: N/A;   FLEXIBLE SIGMOIDOSCOPY N/A 08/04/2020   Procedure: FLEXIBLE SIGMOIDOSCOPY;  Surgeon: Wyline Mood,  MD;  Location: [facility] ENDOSCOPY;  Service: Gastroenterology;  Laterality: N/A;  Per Dr. Tobi Bastos, unsedated   FLEXIBLE SIGMOIDOSCOPY N/A 08/31/2021   Procedure: FLEXIBLE SIGMOIDOSCOPY;  Surgeon: Wyline Mood, MD;  Location: [facility] ENDOSCOPY;  Service: Gastroenterology;  Laterality: N/A;  no sedation   HIP ARTHROPLASTY Right 09/27/2020   Procedure: ARTHROPLASTY BIPOLAR HIP (HEMIARTHROPLASTY);  Surgeon: Kennedy Bucker, MD;  Location: [facility] ORS;  Service: Orthopedics;  Laterality: Right;   JOINT REPLACEMENT     PULMONARY THROMBECTOMY N/A 05/08/2022   Procedure: PULMONARY THROMBECTOMY;  Surgeon: Annice Needy, MD;  Location: [facility] INVASIVE CV LAB;  Service: Cardiovascular;  Laterality: N/A;   ROTATOR CUFF REPAIR Bilateral    left-12/2009; right-03/2009 dr.califf   TONSILLECTOMY     TOTAL SHOULDER REPLACEMENT Left      FAMILY HISTORY   Family History  Problem Relation Age of Onset   COPD Mother    Cancer Brother        colon     SOCIAL HISTORY   Social History   Tobacco Use   Smoking status: Never  Smokeless tobacco: Never  Vaping Use   Vaping Use: Never used  Substance Use Topics   Alcohol use: No   Drug use: No     MEDICATIONS    Home Medication:     Current Medication:  Current Facility-Administered Medications:    0.9 %  sodium chloride infusion, 250 mL, Intravenous, PRN, Wyn Quaker, Marlow Baars, MD   acetaminophen (TYLENOL) tablet 650 mg, 650 mg, Oral, Q6H PRN, Enedina Finner, MD   acidophilus (RISAQUAD) capsule 2 capsule, 2 capsule, Oral, TID, Wyn Quaker, Marlow Baars, MD, 2 capsule at 05/10/22 1020   Chlorhexidine Gluconate Cloth 2 % PADS 6 each, 6 each, Topical, Daily, Dew, Marlow Baars, MD, 6 each at 05/09/22 1511   citalopram (CELEXA) tablet 10 mg, 10 mg, Oral, Daily, Dew, Marlow Baars, MD, 10 mg at 05/10/22 1020   cyanocobalamin (VITAMIN B12) tablet 1,000 mcg, 1,000 mcg, Oral, Becky Sax, Jearl Klinefelter, MD, 1,000 mcg at 05/09/22 1507   diclofenac Sodium (VOLTAREN) 1 % topical gel 2 g, 2 g, Topical, TID PRN, Enedina Finner, MD   feeding supplement (BOOST / RESOURCE BREEZE) liquid 1 Container, 1 Container, Oral, TID BM, Enedina Finner, MD   levalbuterol Pauline Aus) nebulizer solution 0.63 mg, 0.63 mg, Nebulization, TID, Karna Christmas, Jerry Clyne, MD, 0.63 mg at 05/10/22 0755   methylPREDNISolone sodium succinate (SOLU-MEDROL) 40 mg/mL injection 40 mg, 40 mg, Intravenous, Q24H, Enedina Finner, MD, 40 mg at 05/10/22 1322   ondansetron (ZOFRAN) injection 4 mg, 4 mg, Intravenous, Q6H PRN, Dew, Marlow Baars, MD   pantoprazole (PROTONIX) injection 40 mg, 40 mg, Intravenous, Q12H, [place], Teodoro K, MD, 40 mg at 05/10/22 1020   sodium chloride flush (NS) 0.9 % injection 3 mL, 3 mL, Intravenous, Q12H, Dew, Marlow Baars, MD, 3 mL at 05/10/22 1143   sodium chloride flush (NS) 0.9 % injection 3 mL, 3 mL, Intravenous, PRN, Wyn Quaker, Marlow Baars, MD    ALLERGIES   Patient has no known allergies.     REVIEW OF SYSTEMS    Review of Systems:  Gen:  Denies  fever, sweats, chills weigh loss  HEENT: Denies blurred vision, double vision, ear pain, eye pain, hearing loss, nose bleeds, sore throat Cardiac:  No dizziness, chest pain or heaviness, chest tightness,edema Resp:   reports dyspnea chronically  Gi: Denies swallowing difficulty, stomach pain, nausea or vomiting, diarrhea, constipation, bowel incontinence Gu:  Denies bladder incontinence, burning urine Ext:   Denies Joint pain, stiffness or swelling Skin: Denies  skin rash, easy bruising or bleeding or hives Endoc:  Denies polyuria, polydipsia , polyphagia or weight change Psych:   Denies depression, insomnia or hallucinations   Other:  All other systems negative   VS: BP 112/61 (BP Location: Right Arm)   Pulse 70   Temp 98.1 F (36.7 C) (Oral)   Resp 20   Ht 4\' 10"  (1.473 m)   Wt 49.4 kg   SpO2 95%   BMI 22.78 kg/m      PHYSICAL EXAM    GENERAL:NAD, no fevers, chills, no weakness no fatigue HEAD: Normocephalic, atraumatic.  EYES: Pupils equal, round, reactive to light.  Extraocular muscles intact. No scleral icterus.  MOUTH: Moist mucosal membrane. Dentition intact. No abscess noted.  EAR, NOSE, THROAT: Clear without exudates. No external lesions.  NECK: Supple. No thyromegaly. No nodules. No JVD.  PULMONARY: decreased breath sounds with mild rhonchi worse at bases bilaterally.  CARDIOVASCULAR: S1 and S2. Regular rate and rhythm. No murmurs, rubs, or gallops. No edema. Pedal pulses 2+ bilaterally.  GASTROINTESTINAL: Soft, nontender, nondistended. No masses. Positive bowel sounds. No hepatosplenomegaly.  MUSCULOSKELETAL: No swelling, clubbing, or edema. Range of motion full in all extremities.  NEUROLOGIC: Cranial nerves II through XII are intact. No gross focal neurological deficits. Sensation intact. Reflexes intact.  SKIN: No ulceration, lesions, rashes, or cyanosis. Skin warm and dry. Turgor intact.  PSYCHIATRIC: Mood, affect within normal limits. The patient is awake, alert and oriented x 3. Insight, judgment intact.       IMAGING     ASSESSMENT/PLAN   Acute on chronic hypoxemic respiratory failure    - abnormal CT chest with interval development of inflammatory changes as evidenced with serial CT chest above   - possible overlying interstitial edema   - RVP is negative and remainder of micro is negative   - ESR/CRP trending  -currently on solumedrol 40 mg IV  - the situation is very difficult because she benefits from steroids with ulcerative colitis and ILD but it potentiates infection including Cdiff so therapy is further complicated   - stopping diuresis due to GI losses   - colitis seems to be mostly inflammatory , appreciate ID evaluation     Pulmonary fibrosis - chronic   - on esbriet - please continue as current  -PT /OT as able             Thank you for allowing me to participate in the care of this patient.   Patient/Family are satisfied with care plan and all questions have been answered.    Provider  disclosure: Patient with at least one acute or chronic illness or injury that poses a threat to life or bodily function and is being managed actively during this encounter.  All of the below services have been performed independently by signing provider:  review of prior documentation from internal and or external health records.  Review of previous and current lab results.  Interview and comprehensive assessment during patient visit today. Review of current and previous chest radiographs/CT scans. Discussion of management and test interpretation with health care team and patient/family.   This document was prepared using Dragon voice recognition software and may include unintentional dictation errors.     Vida Rigger, M.D.  Division of Pulmonary & Critical Care Medicine

## 2022-05-10 NOTE — Progress Notes (Signed)
[facility] Inpatient Progress Note    Subjective: Patient seen for f/u bloody diarrhea, melena. Resolved currently. Patient with persistently compromised lung function. S/p thrombectomy with hypoxemia. Not responding well with persistent hypoxemia and increased oxygen requirements. Steroids continued for ILD. Antibiotics tapered off by ID. Off anticoagulants secondary to previous GI bleed. Patient requested DNR status after discussing with husband and other physicians.  Objective: Vital signs in last 24 hours: Temp:  [97.6 F (36.4 C)-98.4 F (36.9 C)] 98.1 F (36.7 C) (09/08 1100) Pulse Rate:  [61-84] 70 (09/08 1100) Resp:  [17-28] 20 (09/08 1100) BP: (95-143)/(61-74) 112/61 (09/08 1100) SpO2:  [93 %-98 %] 95 % (09/08 1100) FiO2 (%):  [65 %-77 %] 77 % (09/08 1100) Blood pressure 112/61, pulse 70, temperature 98.1 F (36.7 C), temperature source Oral, resp. rate 20, height 4' 10"  (1.473 m), weight 49.4 kg, SpO2 95 %.    Intake/Output from previous day: 09/07 0701 - 09/08 0700 In: 840 [P.O.:840] Out: 625 [Urine:625]  Intake/Output this shift: Total I/O In: 240 [P.O.:240] Out: -    Gen: NAD. Appears mildly SOB. On HFNC oxygen.  HEENT: [place]/AT. PERRLA. Normal external ear exam.  Chest: CTA, no wheezes.Decreased bilateral expansion.  CV: RR nl S1, S2. No gallops.  Abd: soft, nt, nd. BS+  Ext: no edema. Pulses 2+  Neuro: Alert and oriented. Judgement appears normal. Nonfocal.   Lab Results: No results found for this or any previous visit (from the past 24 hour(s)).   Recent Labs    05/08/22 0536 05/08/22 1837  WBC 9.4  --   HGB 7.8* 10.3*  HCT 24.9* 31.1*  PLT 380  --    BMET Recent Labs    05/08/22 0536 05/09/22 0824  NA 134* 139  K 4.0 4.6  CL 96* 94*  CO2 31 34*  GLUCOSE 202* 109*  BUN 33* 25*  CREATININE 1.03* 1.06*  CALCIUM 8.2* 9.0   LFT No results for input(s): "PROT", "ALBUMIN", "AST", "ALT", "ALKPHOS", "BILITOT",  "BILIDIR", "IBILI" in the last 72 hours. PT/INR No results for input(s): "LABPROT", "INR" in the last 72 hours. Hepatitis Panel No results for input(s): "HEPBSAG", "HCVAB", "HEPAIGM", "HEPBIGM" in the last 72 hours. C-Diff No results for input(s): "CDIFFTOX" in the last 72 hours. No results for input(s): "CDIFFPCR" in the last 72 hours.   Studies/Results: DG Chest Port 1 View  Result Date: 05/09/2022 CLINICAL DATA:  Hypoxia. EXAM: PORTABLE CHEST 1 VIEW COMPARISON:  05/07/2022 FINDINGS: Persistent low lung volumes. Stable cardiomediastinal contours. Bilateral interstitial and airspace opacities are unchanged in the interval. This appears unchanged from the previous exam. Status post left shoulder arthroplasty. IVC filter. Kyphoplasty has been performed at T12 and L1. IMPRESSION: 1. No change in aeration to the lungs compared with previous exam. 2. Persistent low lung volumes. Electronically Signed   By: Kerby Moors M.D.   On: 05/09/2022 05:06   PERIPHERAL VASCULAR CATHETERIZATION  Result Date: 05/08/2022 See surgical note for result.   Scheduled Inpatient Medications:    acidophilus  2 capsule Oral TID   Chlorhexidine Gluconate Cloth  6 each Topical Daily   citalopram  10 mg Oral Daily   cyanocobalamin  1,000 mcg Oral QODAY   feeding supplement  1 Container Oral TID BM   levalbuterol  0.63 mg Nebulization TID   methylPREDNISolone (SOLU-MEDROL) injection  40 mg Intravenous Q24H   pantoprazole (PROTONIX) IV  40 mg Intravenous Q12H   sodium chloride flush  3 mL Intravenous Q12H  Continuous Inpatient Infusions:    sodium chloride      PRN Inpatient Medications:  sodium chloride, acetaminophen, diclofenac Sodium, ondansetron (ZOFRAN) IV, sodium chloride flush  Miscellaneous: N/A  Assessment:  Ulcerative colitis with recent bleeding s/p 2 IV infusions of entyvio prior to admission. Eliquis on Hold per medicine. Diarrhea  Melena - ?Resolving lower GI bleeding vs. New UGI  bleeding. Improved since d/c anticoagulation and beginning IV protonix. Interstitial lung disease - Progressive care per Dr. Lanney Gins.  Afib with RVR - s/p amiodarone. Now reportedly in NSR. New DNR designation today.   Plan:  Continue IV protonix bid. Continue pulmonary treatment given marked hypoxia requiring steroids and increased oxygen requirement. Given change in patient code status and wanting only medical support, will forego entertaining endoluminal evaluation at this time and sign off. I advised the patient and her husband that our service would see again if requested.   Arlita Buffkin K. [name], M.D. 05/10/2022, 3:26 PM

## 2022-05-10 NOTE — Progress Notes (Signed)
[facility] [place] [place] NAME: [name]    MR#:  [id number]  DATE OF BIRTH:  [birth year]  SUBJECTIVE:  No family today at bedside.  Patient is oxygen requirement went up to heated high flow nasal cannula 70% of Fio2 with 50 L oxygen patient drops oxygen saturation easily with exertion  VITALS:  Blood pressure 110/60, pulse 80, temperature 98.1 F (36.7 C), temperature source Oral, resp. rate 17, height 4' 10"  (1.473 m), weight 49.4 kg, SpO2 96 %.  PHYSICAL EXAMINATION:   GENERAL:  86 y.o.-year-old patient lying in the bed with no acute distress. Chronically ill. LUNGS: distant breath sounds bilaterally, no wheezing, rales, rhonchi.  CARDIOVASCULAR: S1, S2 normal. No murmurs. Sinus rhythm ABDOMEN: Soft, nontender, nondistended. Bowel sounds present.  EXTREMITIES: No  edema b/l.    NEUROLOGIC: nonfocal  patient is alert and awake, weak SKIN: per RN  LABORATORY PANEL:  CBC Recent Labs  Lab 05/08/22 0536 05/08/22 1837  WBC 9.4  --   HGB 7.8* 10.3*  HCT 24.9* 31.1*  PLT 380  --      Chemistries  Recent Labs  Lab 05/09/22 0824  NA 139  K 4.6  CL 94*  CO2 34*  GLUCOSE 109*  BUN 25*  CREATININE 1.06*  CALCIUM 9.0  MG 2.0    Cardiac Enzymes No results for input(s): "TROPONINI" in the last 168 hours. RADIOLOGY:  DG Chest Port 1 View  Result Date: 05/09/2022 CLINICAL DATA:  Hypoxia. EXAM: PORTABLE CHEST 1 VIEW COMPARISON:  05/07/2022 FINDINGS: Persistent low lung volumes. Stable cardiomediastinal contours. Bilateral interstitial and airspace opacities are unchanged in the interval. This appears unchanged from the previous exam. Status post left shoulder arthroplasty. IVC filter. Kyphoplasty has been performed at T12 and L1. IMPRESSION: 1. No change in aeration to the lungs compared with previous exam. 2. Persistent low lung volumes. Electronically Signed   By: Kerby Moors M.D.   On: 05/09/2022 05:06   PERIPHERAL VASCULAR  CATHETERIZATION  Result Date: 05/08/2022 See surgical note for result.   Assessment and Plan  [name] is a 86 y.o. female with medical history significant for ILD, recurrent c diff, ulcerative colitis, chronic pain, chronic hypoxic respiratory failure, who presented with dyspnea and palpitations.   Reports a decline of several months with acute worsening the days PTA.  Reports having to increase baseline home o2 of 2-3 liters to 4 liters lately, has had several months of hypoxia, and has worsened over past few weeks to being dyspneic at rest worse with minimal ambulation.    Acute on chronic hypoxemic respiratory failure --recently, pt has been on 4-5L home O2.  Pt was put on NRB in the ED with Sats of 94%.  Currently needing 10L. --covid and RVP neg.  likely progressive ILD, pulm edema, and small PE contributing.  Questionable PNA.  Started on tx for all 4 etiologies on admission. --Continue supplemental O2 to keep sats between 88-92%, wean as tolerated -- chest physiotherapy started   ILD Underlying severe ILD that is likely progressing but at least several days of acutely worsening dyspnea.  --started on prednisone 50 mg daily on admission and escalated up to IV solumedrol. --pulm consulted, Dr. Lanney Gins --cont IV solumedrol 40 mg daily, per pulm --Albuterol neb scheduled, per pulm --wean oxygen slowly down   Bloody Diarrhea, worsening --started having more frequent diarrhea starting night of 9/1.   --C diff neg on presentation.  Likely due to abx  pt received during the first 2 days. --GI consult with Dr. [name]-- recommends avoid antidiarrheals, follow-up with Dr. [name].I. after hospital discharge. Patient is also advised to avoid her make schedule dose of Entyvio secondary to community acquired pneumonia --diarrhea noted to be bloody on 9/3 --d/c Eliquis  Acute blood loss anemia --Hgb dropped from 12.5 to 8.7 in 3 days from bloody diarrhea --Monitor Hgb and transfuse  to keep Hgb >8 --hemoglobin 7.8 will give 1 unit of blood transfusion.  --came in with hgb 12.8---7.8--1 unit BT--10.3  Pulmonary embolus, acute Small on CT, doubt it's the main driver for her dyspnea.  Started on Eliquis --d/c Eliquis due to bloody diarrhea --s/p Mechanical thrombectomy and IVC filter per Dr Lucky Cowboy    AKI --Cr went up to 1.68, from baseline of 0.7 --likely 2/2 to dehydration from diarrhea --improved with IVF --received IVF--creat down to 1.0   Afib w RVR --started night of 9/4.  Started on amiodarone gtt due to hypotension --pt remains in SR--will d/c amiodarone gtt for now    Acute on chronic diastolic CHF --pulm edema seen on CT, elevated BNP --started on IV lasix 40 mg daily, d/c'ed on 9/2 --hold further diuretics due to diarrhea --will give prn lasix   CAP, ruled out --started on cefepime on admission --ID consulted, and ruled out PNA, abx d/c'ed after 2 days.   History recurrent C diff --has multiple BM's per day, sometimes loose, but pt said that's her baseline --C diff neg on presentation   Chronic pain - home norco prn   UC On entyvio ever 2 weeks at home --follows Dr Vicente Males   Hypokalemia --likely due to diarrhea --monitor and replete PRN     DVT prophylaxis: SCD/Compression stockings Code Status: DNR--pt d/w Dr Lanney Gins Family Communication: none Level of care: Progressive Dispo:   The patient is from: home  PT/OT consulted--recommends HHPT  Palliative care to see for GOC     TOTAL TIME TAKING CARE OF THIS PATIENT: 35 minutes.  >50% time spent on counselling and coordination of care  Note: This dictation was prepared with Dragon dictation along with smaller phrase technology. Any transcriptional errors that result from this process are unintentional.  Fritzi Mandes M.D    [facility]   CC: Primary care physician; [name], NP

## 2022-05-10 NOTE — Progress Notes (Signed)
GOALS OF CARE FAMILY CONFERENCE   Current clinical status, hospital findings and medical plan was reviewed with family.   Updated and notified of patients ongoing immediate critical medical problems.   Patient remains hypoxemic with numerous comorbid conditions    Explained to family course of therapy and the modalities   Family is appreciative of care and understand that aggressive measures are likely to be futile   They have consented and agreed to DNR/DNI Code status   Family are satisfied with Plan of action and management. All questions answered    Ottie Glazier, M.D.  Pulmonary & [place]

## 2022-05-10 NOTE — Care Management Important Message (Signed)
Important Message  Patient Details  Name: [name] MRN: [id number] Date of Birth: 1936-05-01   Medicare Important Message Given:  Yes     Dannette Barbara 05/10/2022, 4:01 PM

## 2022-05-10 NOTE — Progress Notes (Signed)
ID Beta D glucan Fungal antibodies Aspergillus Crypto all negative Waiting for SARSCOV2 qualitative VL If she will continue on high dose steroid will consider mepron for PCP prophylaxis Will Discuss with Dr.aleskerov. ID will sig off- call if needed

## 2022-05-10 NOTE — Plan of Care (Signed)

## 2022-05-11 DIAGNOSIS — I2699 Other pulmonary embolism without acute cor pulmonale: Secondary | ICD-10-CM | POA: Diagnosis not present

## 2022-05-11 DIAGNOSIS — J84112 Idiopathic pulmonary fibrosis: Secondary | ICD-10-CM | POA: Diagnosis not present

## 2022-05-11 DIAGNOSIS — J9601 Acute respiratory failure with hypoxia: Secondary | ICD-10-CM | POA: Diagnosis not present

## 2022-05-11 DIAGNOSIS — J189 Pneumonia, unspecified organism: Secondary | ICD-10-CM | POA: Diagnosis not present

## 2022-05-11 MED ORDER — PREDNISONE 50 MG PO TABS
50.0000 mg | ORAL_TABLET | Freq: Every day | ORAL | Status: DC
  Administered 2022-05-12: 50 mg via ORAL
  Filled 2022-05-11: qty 1

## 2022-05-11 NOTE — Progress Notes (Signed)
PULMONOLOGY         Date: 05/11/2022,   MRN# [id number] [name] August 25, 1936     AdmissionWeight: 49.4 kg                 CurrentWeight: 49.4 kg  Referring provider: [name]   CHIEF COMPLAINT:   Acute on chronic hypoxemic respiratory failure   HISTORY OF PRESENT ILLNESS   This is a pleasant 86 yo F with hx of UC, ILD with Pulmonary fibrosis, chronic pain syndrome, who came in due to acute on chronic hypoxemia. She reports increased O2 requirement at home with worsening cough, she denies flu like illness or fevers.  Labwork reveals elevated BNP   05/02/22 - patient is improved clinically, RVP is negative , does not appear to have ongoing infection s/p ID eval with de-escalation of antimicrobials. Placed on eliquis for acute PE with mild clot burden.  CRP and ESR was not collected and I have reordered this. She is nontachypneic during exam speaking in full sentences. Mild hypokalemia on labs has been repleted and repleted magnesium as well.    05/04/22- Patient is a bit uncomfortable this am. She reports worsening diarreah and abd discomfort.  She had 5 loose stools overnight. She has not been able to wean down much from yesterday on O2. We repleted her K but due to GI losses she has hypokalemia still.  Will obtain pharmacy consult to help with ongong electrolyte shift.  Reduced steroids to 60IV once daily, have added TID scheduled tussinex 2.56mL to help with diarreah since we think its non-infectious.  CXR done and reviewed this AM , there are diffuse bilateral infiltrates but improved from rior.   05/07/22- patient had several bloody episodes of loose stools. She thinks today this is slightly better. Her inflammatory biomarkers are still elevated. Her overall presentation seems like a UC patient with COVID19 infection, I wonder if she has an undetectable strain with current testing platform.  O2 requirement is unchanged from 2 days ago.  Im considering vascular surgery  consult for thromectomy evaluation since we have not had much more improvemetn over past 48h and unable to use blood thinners any longer due to onging GI bleeds with UC. Fungitell is still inprocess , remainder of infectiuos workup is negative. I have reduced steroids today. To 40mg  solumedrol  05/08/22-patient is improved on 10L/min [place]. She has thrombectomy planned for today. Will keep solumedrol at 40mg  IV daily. She reports feeling better and is smiling during my evaluation.  Husband at bedside we reivewed medical plan.    05/09/22- patient is on HFNC post thrombectomy, she had a setback post operatively may be due to physical stress.  She does have atelectasis on cxr and we have initiated metaneb q8h with saline. Additional covid testing in process due to clinical suspicion of false negative.   05/10/22- patient is improved slightly and has been weaned from HFNC from 75% to 65%.  We changed code status to DNR today.   05/11/22- patient is slightly improved with less O2 requirement and weaning in process.  Have reduced steroids to PO prednisone 50mg . Plan to continue current plan and start PT.  Plan Reviewed with attending physician.   PAST MEDICAL HISTORY   Past Medical History:  Diagnosis Date   Altered mental state    Anginal pain (HCC)    Anxiety    Basal cell carcinoma 05/03/2021   right neck postauricular - Excised 06/12/21   BMI 30.0-30.9,adult  Cellulitis    Chest pain, atypical    Compression fracture of L4 lumbar vertebra    Constipation    COVID-19 virus infection 05/28/2020   Cystitis    Cystocele    Diverticulosis    Fatigue    Fibrosis, idiopathic pulmonary (HCC)    Gastroenteritis    History of back surgery    History of herniated intervertebral disc    HTN (hypertension)    Hypercholesteremia    Hypocalcemia    Incontinence of urine    OA (osteoarthritis)    shoulder and back   Obesity    Osteopenia    Pneumonia    Shortness of breath    Sleep apnea     Squamous cell carcinoma of skin 07/16/2016   Right upper lateral eyebrow. SCCis.   Squamous cell carcinoma of skin 01/13/2018   Left temporal hairline. WD SCC with superficial infiltration.   Thrush    Vitamin D deficiency      SURGICAL HISTORY   Past Surgical History:  Procedure Laterality Date   BACK SURGERY     CATARACT EXTRACTION W/PHACO Left 04/24/2016   Procedure: CATARACT EXTRACTION PHACO AND INTRAOCULAR LENS PLACEMENT (IOC);  Surgeon: Lockie Mola, MD;  Location: [facility];  Service: Ophthalmology;  Laterality: Left;   CATARACT EXTRACTION W/PHACO Right 07/14/2017   Procedure: CATARACT EXTRACTION PHACO AND INTRAOCULAR LENS PLACEMENT (IOC)  RIGHT;  Surgeon: Lockie Mola, MD;  Location: [facility];  Service: Ophthalmology;  Laterality: Right;   CHOLECYSTECTOMY     COLONOSCOPY WITH PROPOFOL N/A 02/01/2022   Procedure: COLONOSCOPY WITH PROPOFOL;  Surgeon: Wyline Mood, MD;  Location: [facility] ENDOSCOPY;  Service: Gastroenterology;  Laterality: N/A;   CYSTOSCOPY/URETEROSCOPY/HOLMIUM LASER/STENT PLACEMENT Left 04/27/2021   Procedure: CYSTOSCOPY/URETEROSCOPY/HOLMIUM LASER/STENT PLACEMENT;  Surgeon: Sondra Come, MD;  Location: [facility] ORS;  Service: Urology;  Laterality: Left;   ESOPHAGOGASTRODUODENOSCOPY (EGD) WITH PROPOFOL N/A 10/13/2018   Procedure: ESOPHAGOGASTRODUODENOSCOPY (EGD) WITH PROPOFOL;  Surgeon: Wyline Mood, MD;  Location: [facility] ENDOSCOPY;  Service: Gastroenterology;  Laterality: N/A;   ESOPHAGOGASTRODUODENOSCOPY (EGD) WITH PROPOFOL N/A 11/22/2021   Procedure: ESOPHAGOGASTRODUODENOSCOPY (EGD) WITH PROPOFOL;  Surgeon: Wyline Mood, MD;  Location: [facility] ENDOSCOPY;  Service: Gastroenterology;  Laterality: N/A;   EYE SURGERY     FLEXIBLE SIGMOIDOSCOPY N/A 10/13/2018   Procedure: FLEXIBLE SIGMOIDOSCOPY;  Surgeon: Wyline Mood, MD;  Location: [facility] ENDOSCOPY;  Service: Gastroenterology;  Laterality: N/A;   FLEXIBLE SIGMOIDOSCOPY N/A 09/09/2019    Procedure: FLEXIBLE SIGMOIDOSCOPY;  Surgeon: Wyline Mood, MD;  Location: [facility] ENDOSCOPY;  Service: Gastroenterology;  Laterality: N/A;   FLEXIBLE SIGMOIDOSCOPY N/A 08/04/2020   Procedure: FLEXIBLE SIGMOIDOSCOPY;  Surgeon: Wyline Mood, MD;  Location: [facility] ENDOSCOPY;  Service: Gastroenterology;  Laterality: N/A;  Per Dr. Tobi Bastos, unsedated   FLEXIBLE SIGMOIDOSCOPY N/A 08/31/2021   Procedure: FLEXIBLE SIGMOIDOSCOPY;  Surgeon: Wyline Mood, MD;  Location: [facility] ENDOSCOPY;  Service: Gastroenterology;  Laterality: N/A;  no sedation   HIP ARTHROPLASTY Right 09/27/2020   Procedure: ARTHROPLASTY BIPOLAR HIP (HEMIARTHROPLASTY);  Surgeon: Kennedy Bucker, MD;  Location: [facility] ORS;  Service: Orthopedics;  Laterality: Right;   JOINT REPLACEMENT     PULMONARY THROMBECTOMY N/A 05/08/2022   Procedure: PULMONARY THROMBECTOMY;  Surgeon: Annice Needy, MD;  Location: [facility] INVASIVE CV LAB;  Service: Cardiovascular;  Laterality: N/A;   ROTATOR CUFF REPAIR Bilateral    left-12/2009; right-03/2009 dr.califf   TONSILLECTOMY     TOTAL SHOULDER REPLACEMENT Left      FAMILY HISTORY   Family History  Problem Relation Age of Onset  COPD Mother    Cancer Brother        colon     SOCIAL HISTORY   Social History   Tobacco Use   Smoking status: Never   Smokeless tobacco: Never  Vaping Use   Vaping Use: Never used  Substance Use Topics   Alcohol use: No   Drug use: No     MEDICATIONS    Home Medication:     Current Medication:  Current Facility-Administered Medications:    0.9 %  sodium chloride infusion, 250 mL, Intravenous, PRN, Wyn Quaker, Marlow Baars, MD   acetaminophen (TYLENOL) tablet 650 mg, 650 mg, Oral, Q6H PRN, Enedina Finner, MD, 650 mg at 05/10/22 1709   acidophilus (RISAQUAD) capsule 2 capsule, 2 capsule, Oral, TID, Wyn Quaker, Marlow Baars, MD, 2 capsule at 05/11/22 1610   calcium carbonate (TUMS - dosed in mg elemental calcium) chewable tablet 200 mg of elemental calcium, 1 tablet, Oral, QID PRN, Enedina Finner, MD    Chlorhexidine Gluconate Cloth 2 % PADS 6 each, 6 each, Topical, Daily, Dew, Marlow Baars, MD, 6 each at 05/10/22 1100   citalopram (CELEXA) tablet 10 mg, 10 mg, Oral, Daily, Dew, Marlow Baars, MD, 10 mg at 05/11/22 9604   cyanocobalamin (VITAMIN B12) tablet 1,000 mcg, 1,000 mcg, Oral, Becky Sax, Jearl Klinefelter, MD, 1,000 mcg at 05/11/22 5409   diclofenac Sodium (VOLTAREN) 1 % topical gel 2 g, 2 g, Topical, TID PRN, Enedina Finner, MD   feeding supplement (BOOST / RESOURCE BREEZE) liquid 1 Container, 1 Container, Oral, TID BM, Enedina Finner, MD   levalbuterol Pauline Aus) nebulizer solution 0.63 mg, 0.63 mg, Nebulization, TID, Karna Christmas, Aristotle Lieb, MD, 0.63 mg at 05/11/22 0835   methylPREDNISolone sodium succinate (SOLU-MEDROL) 40 mg/mL injection 40 mg, 40 mg, Intravenous, Q24H, Enedina Finner, MD, 40 mg at 05/10/22 1322   ondansetron (ZOFRAN) injection 4 mg, 4 mg, Intravenous, Q6H PRN, Dew, Marlow Baars, MD   pantoprazole (PROTONIX) injection 40 mg, 40 mg, Intravenous, Q12H, [place], Teodoro K, MD, 40 mg at 05/11/22 8119   sodium chloride flush (NS) 0.9 % injection 3 mL, 3 mL, Intravenous, Q12H, Dew, Marlow Baars, MD, 3 mL at 05/11/22 0926   sodium chloride flush (NS) 0.9 % injection 3 mL, 3 mL, Intravenous, PRN, Wyn Quaker, Marlow Baars, MD    ALLERGIES   Patient has no known allergies.     REVIEW OF SYSTEMS    Review of Systems:  Gen:  Denies  fever, sweats, chills weigh loss  HEENT: Denies blurred vision, double vision, ear pain, eye pain, hearing loss, nose bleeds, sore throat Cardiac:  No dizziness, chest pain or heaviness, chest tightness,edema Resp:   reports dyspnea chronically  Gi: Denies swallowing difficulty, stomach pain, nausea or vomiting, diarrhea, constipation, bowel incontinence Gu:  Denies bladder incontinence, burning urine Ext:   Denies Joint pain, stiffness or swelling Skin: Denies  skin rash, easy bruising or bleeding or hives Endoc:  Denies polyuria, polydipsia , polyphagia or weight change Psych:   Denies  depression, insomnia or hallucinations   Other:  All other systems negative   VS: BP 136/67 (BP Location: Right Arm)   Pulse 82   Temp 98.2 F (36.8 C) (Axillary)   Resp 19   Ht 4\' 10"  (1.473 m)   Wt 49.4 kg   SpO2 97%   BMI 22.78 kg/m      PHYSICAL EXAM    GENERAL:NAD, no fevers, chills, no weakness no fatigue HEAD: Normocephalic, atraumatic.  EYES: Pupils equal, round, reactive to light. Extraocular  muscles intact. No scleral icterus.  MOUTH: Moist mucosal membrane. Dentition intact. No abscess noted.  EAR, NOSE, THROAT: Clear without exudates. No external lesions.  NECK: Supple. No thyromegaly. No nodules. No JVD.  PULMONARY: decreased breath sounds with mild rhonchi worse at bases bilaterally.  CARDIOVASCULAR: S1 and S2. Regular rate and rhythm. No murmurs, rubs, or gallops. No edema. Pedal pulses 2+ bilaterally.  GASTROINTESTINAL: Soft, nontender, nondistended. No masses. Positive bowel sounds. No hepatosplenomegaly.  MUSCULOSKELETAL: No swelling, clubbing, or edema. Range of motion full in all extremities.  NEUROLOGIC: Cranial nerves II through XII are intact. No gross focal neurological deficits. Sensation intact. Reflexes intact.  SKIN: No ulceration, lesions, rashes, or cyanosis. Skin warm and dry. Turgor intact.  PSYCHIATRIC: Mood, affect within normal limits. The patient is awake, alert and oriented x 3. Insight, judgment intact.       IMAGING     ASSESSMENT/PLAN   Acute on chronic hypoxemic respiratory failure    - abnormal CT chest with interval development of inflammatory changes as evidenced with serial CT chest above   - possible overlying interstitial edema   - RVP is negative and remainder of micro is negative   - ESR/CRP trending  -currently on solumedrol 40 mg IV transitioning to prednisone 50mg  in am  - the situation is very difficult because she benefits from steroids with ulcerative colitis and ILD but it potentiates infection including Cdiff  so therapy is further complicated   - stopping diuresis due to GI losses   - colitis seems to be mostly inflammatory , appreciate ID evaluation     Pulmonary fibrosis - chronic   - on esbriet - please continue as current  -PT /OT as able             Thank you for allowing me to participate in the care of this patient.   Patient/Family are satisfied with care plan and all questions have been answered.    Provider disclosure: Patient with at least one acute or chronic illness or injury that poses a threat to life or bodily function and is being managed actively during this encounter.  All of the below services have been performed independently by signing provider:  review of prior documentation from internal and or external health records.  Review of previous and current lab results.  Interview and comprehensive assessment during patient visit today. Review of current and previous chest radiographs/CT scans. Discussion of management and test interpretation with health care team and patient/family.   This document was prepared using Dragon voice recognition software and may include unintentional dictation errors.     Vida Rigger, M.D.  Division of Pulmonary & Critical Care Medicine

## 2022-05-11 NOTE — Progress Notes (Signed)
Physical Therapy Treatment Patient Details Name: [name] MRN: [id number] DOB: 11-Jan-1936 Today's Date: 05/11/2022   History of Present Illness Admitted due to dyspnea and palpitations. Reports a decline of several months with acute worsening the past few days. Reports palpitations with minimal movement present for several weeks. No chest pain. Reports having to increase baseline home o2 of 2-3 liters to 4 liters lately, has had several months of hypoxia, and has worsened over past few weeks to being dyspneic at rest worse with minimal ambulation. Slight increased cough, not productive. Imaging revals PE. Per Dr. Billie Ruddy it is small and she is ok to participate with PT.    PT Comments    Pt was long sitting in bed upon arriving. She is A and O and extremely pleasant. Agreeable to PT session and motivated throughout. Pt was on 40 L HHFNC. Resting RR 19, Sao2 95% on 40 L HHFNC. Agreeable to attempting OOB activity. Easily able to achieve EOB short sitting from supine with supervision only.Pt quickly has elevation in RR with desaturation. Only tolerated sitting EOB x ~ 30 secs prior to returning to supine. Sao2 desaturates to 81% with RR in upper 30s. Pt required increase in O2 supply + RN came into room. Prolonged recovery time even in supine with elevated O2 supply. Pt endorses severe fatigue with very limited activity. Pt had just received breathing treatment prior to session. Endorses wanting to go home at DC. Unfortunately due to limited activity tolerance and O2 demands, author does not know if this would be safe. Palliative to consult Monday 05/13/22. Follow Mds DC recs for post acute follow up.     Recommendations for follow up therapy are one component of a multi-disciplinary discharge planning process, led by the attending physician.  Recommendations may be updated based on patient status, additional functional criteria and insurance authorization.  Follow Up Recommendations  Follow  physician's recommendations for discharge plan and follow up therapies (Pt is strong and moves well however unable to tolerate OOB activity due to respiratory response to just sitting up EOB. follow MD's recs for DC. Currently requiring HHFNC.)     Assistance Recommended at Discharge Frequent or constant Supervision/Assistance  Patient can return home with the following A little help with walking and/or transfers;A little help with bathing/dressing/bathroom;Assist for transportation;Assistance with cooking/housework   Equipment Recommendations  None recommended by PT       Precautions / Restrictions Precautions Precautions: Fall Precaution Comments: SaO2/RR Restrictions Weight Bearing Restrictions: No     Mobility  Bed Mobility Overal bed mobility: Needs Assistance Bed Mobility: Supine to Sit Rolling: Supervision   Supine to sit: Supervision, HOB elevated Sit to supine: Supervision   General bed mobility comments: pt was able to exit L side of bed without physical assistance. She however was only able to sit EOB x ~ 30 seconds prior to desaturation to 81% and RR elevating to mid 30s. prolonged recovery time + needed increased O2 supply to max HHFNC. RN in room with Chief Strategy Officer. took several minutes to recover to > 88%. Pt endorses feeling fine throughout however accurate pleth reading. True desaturation noted.    Transfers    General transfer comment: Unable due to limited sitting tolerance. demonstrates strength required to perform however respiratory response to minimal activity is very poor. Palliative to consult monday the 05/13/22      Balance Overall balance assessment: Needs assistance Sitting-balance support: Feet supported Sitting balance-Leahy Scale: Good Sitting balance - Comments: no balance deficits in sitting  Cognition Arousal/Alertness: Awake/alert Behavior During Therapy: WFL for tasks assessed/performed Overall Cognitive Status: Within Functional Limits  for tasks assessed      General Comments: Pt is A and O x 4 and extremely pleasant               Pertinent Vitals/Pain Pain Assessment Pain Assessment: No/denies pain     PT Goals (current goals can now be found in the care plan section) Acute Rehab PT Goals Patient Stated Goal: hopefully go home this week Progress towards PT goals: Not progressing toward goals - comment (respiratory response to activity)    Frequency    Min 2X/week      PT Plan Current plan remains appropriate       AM-PAC PT "6 Clicks" Mobility   Outcome Measure  Help needed turning from your back to your side while in a flat bed without using bedrails?: None Help needed moving from lying on your back to sitting on the side of a flat bed without using bedrails?: None Help needed moving to and from a bed to a chair (including a wheelchair)?: A Little Help needed standing up from a chair using your arms (e.g., wheelchair or bedside chair)?: A Little Help needed to walk in hospital room?: A Little Help needed climbing 3-5 steps with a railing? : A Little 6 Click Score: 20    End of Session Equipment Utilized During Treatment: Oxygen (40 L HHFNC however needed to be elevated to max supply to recover with minmal activity) Activity Tolerance: Other (comment) (limited by respiratory response to activity) Patient left: in bed;with call bell/phone within reach;with bed alarm set;with nursing/sitter in room;with SCD's reapplied Nurse Communication: Mobility status PT Visit Diagnosis: Other abnormalities of gait and mobility (R26.89);Muscle weakness (generalized) (M62.81)     Time: 1696-7893 PT Time Calculation (min) (ACUTE ONLY): 15 min  Charges:  $Therapeutic Activity: 8-22 mins                     Julaine Fusi PTA 05/11/22, 4:05 PM

## 2022-05-11 NOTE — Progress Notes (Signed)
[place] [facility] [place] NAME: [name]    MR#:  [id number]  DATE OF BIRTH:  08/20/1936  SUBJECTIVE:   Patient is oxygen requirement went down to high flow nasal cannula 60% of Fio2 with 40 L oxygen patient drops oxygen saturation easily with exertion. PT to work with her  VITALS:  Blood pressure 131/72, pulse 86, temperature 98 F (36.7 C), temperature source Oral, resp. rate 20, height 4' 10"  (1.473 m), weight 49.4 kg, SpO2 96 %.  PHYSICAL EXAMINATION:   GENERAL:  86 y.o.-year-old patient lying in the bed with no acute distress. Chronically ill. LUNGS: distant breath sounds bilaterally  CARDIOVASCULAR: S1, S2 normal. No murmurs. Sinus rhythm ABDOMEN: Soft, nontender, nondistended. Bowel sounds present.  EXTREMITIES: No  edema b/l.    NEUROLOGIC: nonfocal  patient is alert and awake, weak SKIN: per RN  LABORATORY PANEL:  CBC Recent Labs  Lab 05/08/22 0536 05/08/22 1837  WBC 9.4  --   HGB 7.8* 10.3*  HCT 24.9* 31.1*  PLT 380  --      Chemistries  Recent Labs  Lab 05/09/22 0824  NA 139  K 4.6  CL 94*  CO2 34*  GLUCOSE 109*  BUN 25*  CREATININE 1.06*  CALCIUM 9.0  MG 2.0    Cardiac Enzymes No results for input(s): "TROPONINI" in the last 168 hours. RADIOLOGY:  No results found.  Assessment and Plan  [name] is a 86 y.o. female with medical history significant for ILD, recurrent c diff, ulcerative colitis, chronic pain, chronic hypoxic respiratory failure, who presented with dyspnea and palpitations.   Reports a decline of several months with acute worsening the days PTA.  Reports having to increase baseline home o2 of 2-3 liters to 4 liters lately, has had several months of hypoxia, and has worsened over past few weeks to being dyspneic at rest worse with minimal ambulation.    Acute on chronic hypoxemic respiratory failure --recently, pt has been on 4-5L home O2.   --covid and RVP neg.  likely  progressive ILD, pulm edema, and small PE contributing.  Questionable PNA.  Started on tx for all 4 etiologies on admission. --Continue supplemental O2 to keep sats between 88-92%, wean as tolerated -- chest physiotherapy started   ILD Underlying severe ILD that is likely progressing but at least several days of acutely worsening dyspnea.  --started on prednisone 50 mg daily on admission and escalated up to IV solumedrol. --pulm consulted, Dr. Lanney [name] --cont IV solumedrol 40 mg daily, per pulm--change to po steroid --Albuterol neb scheduled, per pulm --wean oxygen slowly down   Bloody Diarrhea, worsening H/o UC --started having more frequent diarrhea starting night of 9/1.   --C diff neg on presentation.  Likely due to abx pt received during the first 2 days. --GI consult with Dr. Alice [name]-- recommends avoid antidiarrheals, follow-up with Dr. Mancel [name].I. after hospital discharge. Patient is also advised to avoid her make schedule dose of Entyvio secondary to community acquired pneumonia --diarrhea noted to be bloody on 9/3 --d/c Eliquis  Acute blood loss anemia --Hgb dropped from 12.5 to 8.7 in 3 days from bloody diarrhea --Monitor Hgb and transfuse to keep Hgb >8 --hemoglobin 7.8 will give 1 unit of blood transfusion.  --came in with hgb 12.8---7.8--1 unit BT--10.3  Pulmonary embolus, acute Small on CT, doubt it's the main driver for her dyspnea.  Started on Eliquis --d/c Eliquis due to bloody diarrhea --s/p Mechanical thrombectomy and  IVC filter per Dr [name]    AKI --Cr went up to 1.68, from baseline of 0.7 --likely 2/2 to dehydration from diarrhea --improved with IVF --received IVF--creat down to 1.0   Afib w RVR --started night of 9/4.  Started on amiodarone gtt due to hypotension --pt remains in SR--will d/c amiodarone gtt for now    Acute on chronic diastolic CHF --pulm edema seen on CT, elevated BNP --started on IV lasix 40 mg daily, d/c'ed on 9/2 --hold further  diuretics due to diarrhea --will give prn lasix   CAP, ruled out --started on cefepime on admission --ID consulted, and ruled out PNA, abx d/c'ed after 2 days.   History recurrent C diff --has multiple BM's per day, sometimes loose, but pt said that's her baseline --C diff neg on presentation   Chronic pain - home norco prn   Hypokalemia --likely due to diarrhea --monitor and replete PRN     DVT prophylaxis: SCD/Compression stockings Code Status: DNR--pt d/w Dr [name] Family Communication: none Level of care: Progressive Dispo:   The patient is from: home  PT/OT consulted--recommends HHPT  Palliative care to see for GOC     TOTAL TIME TAKING CARE OF THIS PATIENT: 35 minutes.  >50% time spent on counselling and coordination of care  Note: This dictation was prepared with Dragon dictation along with smaller phrase technology. Any transcriptional errors that result from this process are unintentional.  [name] M.D    [name]   CC: Primary care physician; Terri Koch, NP

## 2022-05-12 DIAGNOSIS — J189 Pneumonia, unspecified organism: Secondary | ICD-10-CM | POA: Diagnosis not present

## 2022-05-12 DIAGNOSIS — J9601 Acute respiratory failure with hypoxia: Secondary | ICD-10-CM | POA: Diagnosis not present

## 2022-05-12 DIAGNOSIS — I2699 Other pulmonary embolism without acute cor pulmonale: Secondary | ICD-10-CM | POA: Diagnosis not present

## 2022-05-12 DIAGNOSIS — J84112 Idiopathic pulmonary fibrosis: Secondary | ICD-10-CM | POA: Diagnosis not present

## 2022-05-12 NOTE — Progress Notes (Signed)
[place] [facility] [place] NAME: [name]    MR#:  [id number]  DATE OF BIRTH:  19-Jan-1936  SUBJECTIVE:   Patient is oxygen requirement went down to high flow nasal cannula 55% of Fio2 with 40 L oxygen. Sat up to eat BF at edge of the bed earlier Worked some with PT. Takes all her energy out  VITALS:  Blood pressure 109/65, pulse 91, temperature (!) 97.5 F (36.4 C), temperature source Oral, resp. rate (!) 25, height 4' 10"  (1.473 m), weight 49.4 kg, SpO2 90 %.  PHYSICAL EXAMINATION:   GENERAL:  86 y.o.-year-old patient lying in the bed with no acute distress. Chronically ill. Thin, fraile LUNGS: distant breath sounds bilaterally  CARDIOVASCULAR: S1, S2 normal. No murmurs. Sinus rhythm ABDOMEN: Soft, nontender, nondistended. Bowel sounds present.  EXTREMITIES: No  edema b/l.    NEUROLOGIC: nonfocal  patient is alert and awake, weak SKIN: per RN  LABORATORY PANEL:  CBC Recent Labs  Lab 05/08/22 0536 05/08/22 1837  WBC 9.4  --   HGB 7.8* 10.3*  HCT 24.9* 31.1*  PLT 380  --      Chemistries  Recent Labs  Lab 05/09/22 0824  NA 139  K 4.6  CL 94*  CO2 34*  GLUCOSE 109*  BUN 25*  CREATININE 1.06*  CALCIUM 9.0  MG 2.0    Cardiac Enzymes No results for input(s): "TROPONINI" in the last 168 hours. RADIOLOGY:  No results found.  Assessment and Plan  [name] is a 86 y.o. female with medical history significant for ILD, recurrent c diff, ulcerative colitis, chronic pain, chronic hypoxic respiratory failure, who presented with dyspnea and palpitations.   Reports a decline of several months with acute worsening the days PTA.  Reports having to increase baseline home o2 of 2-3 liters to 4 liters lately, has had several months of hypoxia, and has worsened over past few weeks to being dyspneic at rest worse with minimal ambulation.    Acute on chronic hypoxemic respiratory failure --recently, pt has been on 4-5L  home O2.   --covid and RVP neg.  likely progressive ILD, pulm edema, and small PE contributing.  Questionable PNA.  Started on tx for all 4 etiologies on admission. --Continue supplemental O2 to keep sats between 88-92%, wean as tolerated -- chest physiotherapy started   ILD Underlying severe ILD that is likely progressing but at least several days of acutely worsening dyspnea.  --started on prednisone 50 mg daily on admission and escalated up to IV solumedrol. --pulm consulted, Dr. Lanney Gins --cont IV solumedrol 40 mg daily, per pulm--change to po steroid --Albuterol neb scheduled, per pulm --wean oxygen slowly down   Bloody Diarrhea, worsening H/o UC --started having more frequent diarrhea starting night of 9/1.   --C diff neg on presentation.  Likely due to abx pt received during the first 2 days. --GI consult with Dr. [name]-- recommends avoid antidiarrheals, follow-up with Dr. [name].I. after hospital discharge. Patient is also advised to avoid her make schedule dose of Entyvio secondary to community acquired pneumonia --diarrhea noted to be bloody on 9/3 --d/c Eliquis  Acute blood loss anemia --Hgb dropped from 12.5 to 8.7 in 3 days from bloody diarrhea --Monitor Hgb and transfuse to keep Hgb >8 --hemoglobin 7.8 will give 1 unit of blood transfusion.  --came in with hgb 12.8---7.8--1 unit BT--10.3  Pulmonary embolus, acute Small on CT, doubt it's the main driver for her dyspnea.  Started  on Eliquis --d/c Eliquis due to bloody diarrhea --s/p Mechanical thrombectomy and IVC filter per Dr Lucky Cowboy    AKI --Cr went up to 1.68, from baseline of 0.7 --likely 2/2 to dehydration from diarrhea --improved with IVF --received IVF--creat down to 1.0   Afib w RVR --started night of 9/4.  Started on amiodarone gtt due to hypotension --pt remains in SR--will d/c amiodarone gtt for now    Acute on chronic diastolic CHF --pulm edema seen on CT, elevated BNP --started on IV lasix 40 mg  daily, d/c'ed on 9/2 --hold further diuretics due to diarrhea --will give prn lasix   CAP, ruled out --started on cefepime on admission --ID consulted, and ruled out PNA, abx d/c'ed after 2 days.   History recurrent C diff --has multiple BM's per day, sometimes loose, but pt said that's her baseline --C diff neg on presentation   Chronic pain - home norco prn   Hypokalemia --likely due to diarrhea --monitor and replete PRN      DVT prophylaxis: SCD/Compression stockings Code Status: DNR--pt d/w Dr Lanney Gins Family Communication: none Level of care: Progressive Dispo:   The patient is from: home  PT/OT consulted--recommends HHPT  Palliative care to see for GOC     TOTAL TIME TAKING CARE OF THIS PATIENT: 35 minutes.  >50% time spent on counselling and coordination of care  Note: This dictation was prepared with Dragon dictation along with smaller phrase technology. Any transcriptional errors that result from this process are unintentional.  Fritzi Mandes M.D    [facility]   CC: Primary care physician; [name], NP

## 2022-05-13 ENCOUNTER — Inpatient Hospital Stay: Payer: Medicare HMO

## 2022-05-13 DIAGNOSIS — J84112 Idiopathic pulmonary fibrosis: Secondary | ICD-10-CM | POA: Diagnosis not present

## 2022-05-13 DIAGNOSIS — Z66 Do not resuscitate: Secondary | ICD-10-CM

## 2022-05-13 DIAGNOSIS — G894 Chronic pain syndrome: Secondary | ICD-10-CM

## 2022-05-13 DIAGNOSIS — J189 Pneumonia, unspecified organism: Secondary | ICD-10-CM | POA: Diagnosis not present

## 2022-05-13 DIAGNOSIS — K5190 Ulcerative colitis, unspecified, without complications: Secondary | ICD-10-CM

## 2022-05-13 DIAGNOSIS — I2699 Other pulmonary embolism without acute cor pulmonale: Secondary | ICD-10-CM | POA: Diagnosis not present

## 2022-05-13 DIAGNOSIS — J9601 Acute respiratory failure with hypoxia: Secondary | ICD-10-CM | POA: Diagnosis not present

## 2022-05-13 MED ORDER — PREDNISONE 50 MG PO TABS
45.0000 mg | ORAL_TABLET | Freq: Every day | ORAL | Status: DC
  Administered 2022-05-13 – 2022-05-14 (×2): 45 mg via ORAL
  Filled 2022-05-13 (×2): qty 1

## 2022-05-13 NOTE — Consult Note (Cosign Needed Addendum)
Consultation Note Date: 05/13/2022   Patient Name: [name]  DOB: 14-Oct-1935  MRN: [id number]  Age / Sex: 86 y.o., female  PCP: [name], NP Referring Physician: Fritzi Mandes, MD  Reason for Consultation: Establishing goals of care  HPI/Patient Profile: 86 y.o. female  with past medical history of ulcerative colitis, ILD with pulmonary fibrosis, chronic pain syndrome, and acute on chronic hypoxemia admitted on 05/01/2022 with increasing oxygen requirements at home with worsening cough.   Clinical Assessment and Goals of Care: I have reviewed medical records including EPIC notes, labs and imaging, assessed the patient and then met with patient at bedside to discuss diagnosis prognosis, GOC, EOL wishes, disposition and options.  I introduced Palliative Medicine as specialized medical care for people living with serious illness. It focuses on providing relief from the symptoms and stress of a serious illness. The goal is to improve quality of life for both the patient and the family.  We discussed a brief life review of the patient.  She lives with her husband of 4 years.  She has 1 daughter and 1 grandson and 1 granddaughter.  As far as functional and nutritional status patient shares she has not been doing well for "a while".  She recalls the last several months she has noticed a significant decline in her ability to "get up and go".  Patient endorses increased recent needs for trying new medications and oxygen requirements at home.  She shares she has tried everything with her doctors to improve her breathing but that it is "just not getting better".  We discussed patient's current illness and what it means in the larger context of patient's on-going co-morbidities. I attempted to elicit values and goals of care important to the patient.  Patient shares what is most important to her at this time  with her family.  She shares she does not want to ever be kept artificially alive, but wants to try everything she can in order to to be able to get home to visit with her family.  Advance directives, concepts specific to code status and rehospitalization were considered and discussed.  Patient confirms DNR/DNI.  She shares she would like to return home and if there is something that can be treated she would be in agreement to return to the hospital.  Discussed stairstep nature of recurrent hospitalizations tracking from patient's overall functional status.  Patient endorses she has felt significantly more weak during this hospitalization than previous ones.  Education offered regarding concept specific to human mortality and the limitations of medical interventions to prolong life when the body begins to fail to thrive.  Discussed chronic, irreversible and progressive nature of ILD with pulmonary fibrosis.  Reviewed limitations of supplemental oxygen.  Discussed use of medications as needed for increased work of breathing, feelings of breathlessness, pain, and anxiety.  Patient shares she is appreciative of this information since she understands limits on "how much more oxygen they can give me".   Discussed with patient the importance of continued  conversation with family and the medical providers regarding overall plan of care and treatment options, ensuring decisions are within the context of the patient's values and GOCs.    Questions and concerns were addressed. The family was encouraged to call with questions or concerns.  PMT will continue to follow patient throughout her hospitalization.  Patient shares her husband often visits her during the day.  Patient has PMT contact information was advised to call with any palliative needs.  I am on service and plan on rounding on the patient tomorrow.  Primary Decision Maker PATIENT  Code Status/Advance Care Planning: DNR  Prognosis:   Unable to  determine  Discharge Planning: To Be Determined  Primary Diagnoses: Present on Admission:  Chronic pain syndrome  Idiopathic pulmonary fibrosis ([place])  Ulcerative colitis ([place])  CAP (community acquired pneumonia)   Physical Exam Vitals reviewed.  Constitutional:      General: She is not in acute distress.    Appearance: She is well-developed.  Cardiovascular:     Rate and Rhythm: Normal rate.  Pulmonary:     Breath sounds: Examination of the right-middle field reveals decreased breath sounds. Examination of the right-lower field reveals decreased breath sounds. Examination of the left-lower field reveals decreased breath sounds. Decreased breath sounds present.  Abdominal:     Palpations: Abdomen is soft.  Musculoskeletal:     Comments: Dyspnea with exertion, generalized weakness  Skin:    General: Skin is warm and dry.  Neurological:     Mental Status: She is alert and oriented to person, place, and time.  Psychiatric:        Mood and Affect: Mood is not anxious.        Behavior: Behavior is not agitated.     Palliative Assessment/Data: 50%     Thank you for this consult. Palliative medicine will continue to follow and assist holistically.   Time Total: 75 minutes Greater than 50%  of this time was spent counseling and coordinating care related to the above assessment and plan.  Signed by: Jordan Hawks, DNP, FNP-BC Palliative Medicine    Please contact Palliative Medicine Team phone at [phone] for questions and concerns.  For individual provider: See Shea Evans

## 2022-05-13 NOTE — Progress Notes (Signed)
Physical Therapy Treatment Patient Details Name: [name] MRN: [id number] DOB: [birth year] Today's Date: 05/13/2022   History of Present Illness Admitted due to dyspnea and palpitations. Reports a decline of several months with acute worsening the past few days. Reports palpitations with minimal movement present for several weeks. No chest pain. Reports having to increase baseline home o2 of 2-3 liters to 4 liters lately, has had several months of hypoxia, and has worsened over past few weeks to being dyspneic at rest worse with minimal ambulation. Slight increased cough, not productive. Imaging revals PE. Per Dr. Fran Lowes it is small and she is ok to participate with PT.    PT Comments    Pt is making gradual progress towards goals and is able to transfer from bed->chair this session. Pt continues to be limited secondary to respiratory decline. Sats decreased to 72% with exertion, however with extended recovery, improvement to 91%. Pt very pleased that she is able to sit in recliner, however demonstrates limited endurance. Will continue to progress as able.  Recommendations for follow up therapy are one component of a multi-disciplinary discharge planning process, led by the attending physician.  Recommendations may be updated based on patient status, additional functional criteria and insurance authorization.  Follow Up Recommendations  Follow physician's recommendations for discharge plan and follow up therapies (Currently requiring HFNC, however moves well considering)     Assistance Recommended at Discharge Intermittent Supervision/Assistance  Patient can return home with the following A little help with walking and/or transfers;A little help with bathing/dressing/bathroom;Assist for transportation;Assistance with cooking/housework   Equipment Recommendations  None recommended by PT    Recommendations for Other Services       Precautions / Restrictions Precautions Precautions:  Fall Precaution Comments: SaO2/RR Restrictions Weight Bearing Restrictions: No     Mobility  Bed Mobility Overal bed mobility: Needs Assistance Bed Mobility: Supine to Sit     Supine to sit: Min guard     General bed mobility comments: needs cues for sequencing. Once seated at EOB, upright posture. O2 sats WNL while seated at EOB. Still on 40L of O2 throughout session    Transfers Overall transfer level: Needs assistance Equipment used: 1 person hand held assist Transfers: Bed to chair/wheelchair/BSC     Step pivot transfers: Min assist       General transfer comment: needs assist for sequencing. 2nd person for cord management. HHA given. Once seated, sats decrease to 72%, with extended recovery to improve to 91% with cues for PLB. RR increased to 40 with exertion    Ambulation/Gait               General Gait Details: unable   Stairs             Wheelchair Mobility    Modified Rankin (Stroke Patients Only)       Balance Overall balance assessment: Needs assistance Sitting-balance support: Feet supported Sitting balance-Leahy Scale: Good     Standing balance support: During functional activity, No upper extremity supported Standing balance-Leahy Scale: Fair                              Cognition Arousal/Alertness: Awake/alert Behavior During Therapy: WFL for tasks assessed/performed Overall Cognitive Status: Within Functional Limits for tasks assessed  General Comments: very pleasant and agreeable to session        Exercises Other Exercises Other Exercises: supine ther-ex performed including B LE AP, SAQ, SLRs, and hip ab/add. 10 reps with cga. Cues for breathing with exertion.    General Comments        Pertinent Vitals/Pain Pain Assessment Pain Assessment: No/denies pain    Home Living                          Prior Function            PT Goals (current  goals can now be found in the care plan section) Acute Rehab PT Goals Patient Stated Goal: hopefully home PT Goal Formulation: With patient Time For Goal Achievement: 05/17/22 Potential to Achieve Goals: Good Progress towards PT goals: Progressing toward goals    Frequency    Min 2X/week      PT Plan Current plan remains appropriate    Co-evaluation              AM-PAC PT "6 Clicks" Mobility   Outcome Measure  Help needed turning from your back to your side while in a flat bed without using bedrails?: None Help needed moving from lying on your back to sitting on the side of a flat bed without using bedrails?: A Little Help needed moving to and from a bed to a chair (including a wheelchair)?: A Little Help needed standing up from a chair using your arms (e.g., wheelchair or bedside chair)?: A Little Help needed to walk in hospital room?: A Lot Help needed climbing 3-5 steps with a railing? : Total 6 Click Score: 16    End of Session Equipment Utilized During Treatment: Oxygen Activity Tolerance: Patient limited by fatigue Patient left: in chair;with nursing/sitter in room;with family/visitor present Nurse Communication: Mobility status PT Visit Diagnosis: Other abnormalities of gait and mobility (R26.89);Muscle weakness (generalized) (M62.81)     Time: 3664-4034 PT Time Calculation (min) (ACUTE ONLY): 23 min  Charges:  $Therapeutic Exercise: 8-22 mins $Therapeutic Activity: 8-22 mins                     Elizabeth Palau, PT, DPT, GCS [phone]    [name] 05/13/2022, 12:45 PM

## 2022-05-13 NOTE — Progress Notes (Signed)
[place] at [place] NAME: [name]    MR#:  [id number]  DATE OF BIRTH:  April 21, 1936  SUBJECTIVE:   Patient is oxygen requirement went down to high flow nasal cannula 55% of Fio2 with 8 L oxygen. Pt worked with PT and sat out int he chair!!  VITALS:  Blood pressure 115/62, pulse 86, temperature 98.5 F (36.9 C), temperature source Oral, resp. rate (!) 26, height 4' 10"  (1.473 m), weight 49.4 kg, SpO2 94 %.  PHYSICAL EXAMINATION:   GENERAL:  86 y.o.-year-old patient lying in the bed with no acute distress. Chronically ill. Thin, fraile LUNGS: distant breath sounds bilaterally  CARDIOVASCULAR: S1, S2 normal. No murmurs. Sinus rhythm ABDOMEN: Soft, nontender, nondistended. Bowel sounds present.  EXTREMITIES: No  edema b/l.    NEUROLOGIC: nonfocal  patient is alert and awake, weak SKIN: per RN  LABORATORY PANEL:  CBC Recent Labs  Lab 05/08/22 0536 05/08/22 1837  WBC 9.4  --   HGB 7.8* 10.3*  HCT 24.9* 31.1*  PLT 380  --      Chemistries  Recent Labs  Lab 05/09/22 0824  NA 139  K 4.6  CL 94*  CO2 34*  GLUCOSE 109*  BUN 25*  CREATININE 1.06*  CALCIUM 9.0  MG 2.0    Cardiac Enzymes No results for input(s): "TROPONINI" in the last 168 hours. RADIOLOGY:  DG Chest Port 1 View  Result Date: 05/13/2022 CLINICAL DATA:  Infiltrate of the lung EXAM: PORTABLE CHEST 1 VIEW COMPARISON:  05/09/2022 FINDINGS: Unchanged AP portable chest radiograph. Heart size is normal. Diffuse bilateral interstitial and heterogeneous airspace opacity, unchanged. No new or focal airspace opacity. Status post left shoulder reverse arthroplasty. IMPRESSION: Unchanged AP portable chest radiograph. Diffuse bilateral interstitial and heterogeneous airspace opacity, unchanged, consistent with edema and or infection. No new or focal airspace opacity. Electronically Signed   By: Delanna Ahmadi M.D.   On: 05/13/2022 08:13    Assessment and Plan  [name] is a 86 y.o. female with medical history significant for ILD, recurrent c diff, ulcerative colitis, chronic pain, chronic hypoxic respiratory failure, who presented with dyspnea and palpitations.   Reports a decline of several months with acute worsening the days PTA.  Reports having to increase baseline home o2 of 2-3 liters to 4 liters lately, has had several months of hypoxia, and has worsened over past few weeks to being dyspneic at rest worse with minimal ambulation.    Acute on chronic hypoxemic respiratory failure --recently, pt has been on 4-5L home O2.   --covid and RVP neg.  likely progressive ILD, pulm edema, and small PE contributing.  Questionable PNA.  Started on tx for all 4 etiologies on admission. --Continue supplemental O2 to keep sats between 88-92%, wean as tolerated -- chest physiotherapy started   ILD Underlying severe ILD that is likely progressing but at least several days of acutely worsening dyspnea.  --started on prednisone 50 mg daily on admission and escalated up to IV solumedrol. --pulm consulted, Dr. Lanney Gins --cont IV solumedrol 40 mg daily, per pulm--changed to po steroid --Albuterol neb scheduled, per pulm --wean oxygen slowly down   Bloody Diarrhea, worsening H/o UC --C diff neg on presentation.  Likely due to abx pt received during the first 2 days. --GI consult with Dr. [name]-- recommends avoid antidiarrheals, follow-up with Dr. [name].I. after hospital discharge.  --d/c Eliquis  Acute blood loss anemia --Hgb dropped from 12.5 to 8.7  in 3 days from bloody diarrhea --Monitor Hgb and transfuse to keep Hgb >8 --hemoglobin 7.8 will give 1 unit of blood transfusion.  --came in with hgb 12.8---7.8--1 unit BT--10.3  Pulmonary embolus, acute Small on CT, doubt it's the main driver for her dyspnea.   --d/c Eliquis due to bloody diarrhea --s/p Mechanical thrombectomy and IVC filter per Dr Lucky Cowboy    AKI --Cr went up to 1.68, from baseline of  0.7 --likely 2/2 to dehydration from diarrhea --improved with IVF --received IVF--creat down to 1.0   Afib w RVR --started night of 9/4.  Started on amiodarone gtt due to hypotension --pt remains in SR--will d/c amiodarone gtt for now    Acute on chronic diastolic CHF --pulm edema seen on CT, elevated BNP --started on IV lasix 40 mg daily, d/c'ed on 9/2 --hold further diuretics due to diarrhea --will give prn lasix   CAP, ruled out --ID consulted, and ruled out PNA, abx d/c'ed after 2 days.   History recurrent C diff --has multiple BM's per day, sometimes loose, but pt said that's her baseline --C diff neg on presentation   Chronic pain - home norco prn   Hypokalemia --likely due to diarrhea --monitor and replete PRN      DVT prophylaxis: SCD/Compression stockings Code Status: DNR--pt d/w Dr Lanney Gins Family Communication: none Level of care: Progressive Dispo:   The patient is from: home patient remains on high flow nasal care. Improving slowly. Will discharged to home once oxygen requirement is down to 5 to 6 L nasal cannula  PT/OT consulted--recommends HHPT  Palliative care input appreciated     TOTAL TIME TAKING CARE OF THIS PATIENT: 35 minutes.  >50% time spent on counselling and coordination of care  Note: This dictation was prepared with Dragon dictation along with smaller phrase technology. Any transcriptional errors that result from this process are unintentional.  Fritzi Mandes M.D    [facility]   CC: Primary care physician; [name], NP

## 2022-05-13 NOTE — Progress Notes (Signed)
PULMONOLOGY         Date: 05/13/2022,   MRN# [id number] [name] 04/22/1936     AdmissionWeight: 49.4 kg                 CurrentWeight: 49.4 kg  Referring provider: [name]   CHIEF COMPLAINT:   Acute on chronic hypoxemic respiratory failure   HISTORY OF PRESENT ILLNESS   This is a pleasant 86 yo F with hx of UC, ILD with Pulmonary fibrosis, chronic pain syndrome, who came in due to acute on chronic hypoxemia. She reports increased O2 requirement at home with worsening cough, she denies flu like illness or fevers.  Labwork reveals elevated BNP   05/02/22 - patient is improved clinically, RVP is negative , does not appear to have ongoing infection s/p ID eval with de-escalation of antimicrobials. Placed on eliquis for acute PE with mild clot burden.  CRP and ESR was not collected and I have reordered this. She is nontachypneic during exam speaking in full sentences. Mild hypokalemia on labs has been repleted and repleted magnesium as well.    05/04/22- Patient is a bit uncomfortable this am. She reports worsening diarreah and abd discomfort.  She had 5 loose stools overnight. She has not been able to wean down much from yesterday on O2. We repleted her K but due to GI losses she has hypokalemia still.  Will obtain pharmacy consult to help with ongong electrolyte shift.  Reduced steroids to 60IV once daily, have added TID scheduled tussinex 2.51mL to help with diarreah since we think its non-infectious.  CXR done and reviewed this AM , there are diffuse bilateral infiltrates but improved from rior.   05/07/22- patient had several bloody episodes of loose stools. She thinks today this is slightly better. Her inflammatory biomarkers are still elevated. Her overall presentation seems like a UC patient with COVID19 infection, I wonder if she has an undetectable strain with current testing platform.  O2 requirement is unchanged from 2 days ago.  Im considering vascular surgery  consult for thromectomy evaluation since we have not had much more improvemetn over past 48h and unable to use blood thinners any longer due to onging GI bleeds with UC. Fungitell is still inprocess , remainder of infectiuos workup is negative. I have reduced steroids today. To 40mg  solumedrol  05/08/22-patient is improved on 10L/min [place]. She has thrombectomy planned for today. Will keep solumedrol at 40mg  IV daily. She reports feeling better and is smiling during my evaluation.  Husband at bedside we reivewed medical plan.    05/09/22- patient is on HFNC post thrombectomy, she had a setback post operatively may be due to physical stress.  She does have atelectasis on cxr and we have initiated metaneb q8h with saline. Additional covid testing in process due to clinical suspicion of false negative.   05/10/22- patient is improved slightly and has been weaned from HFNC from 75% to 65%.  We changed code status to DNR today.   05/11/22- patient is slightly improved with less O2 requirement and weaning in process.  Have reduced steroids to PO prednisone 50mg . Plan to continue current plan and start PT.  Plan Reviewed with attending physician.   05/13/22- patient feels slightly improved, plan today to get OOB more, her loose stools have essentially resolved.  No new events.  Her spO2 goal is anything >80%.  CXR repeat today.  Reduced PO prednisone  PAST MEDICAL HISTORY   Past Medical History:  Diagnosis Date   Altered mental state    Anginal pain (HCC)    Anxiety    Basal cell carcinoma 05/03/2021   right neck postauricular - Excised 06/12/21   BMI 30.0-30.9,adult    Cellulitis    Chest pain, atypical    Compression fracture of L4 lumbar vertebra    Constipation    COVID-19 virus infection 05/28/2020   Cystitis    Cystocele    Diverticulosis    Fatigue    Fibrosis, idiopathic pulmonary (HCC)    Gastroenteritis    History of back surgery    History of herniated intervertebral disc    HTN  (hypertension)    Hypercholesteremia    Hypocalcemia    Incontinence of urine    OA (osteoarthritis)    shoulder and back   Obesity    Osteopenia    Pneumonia    Shortness of breath    Sleep apnea    Squamous cell carcinoma of skin 07/16/2016   Right upper lateral eyebrow. SCCis.   Squamous cell carcinoma of skin 01/13/2018   Left temporal hairline. WD SCC with superficial infiltration.   Thrush    Vitamin D deficiency      SURGICAL HISTORY   Past Surgical History:  Procedure Laterality Date   BACK SURGERY     CATARACT EXTRACTION W/PHACO Left 04/24/2016   Procedure: CATARACT EXTRACTION PHACO AND INTRAOCULAR LENS PLACEMENT (IOC);  Surgeon: Lockie Mola, MD;  Location: [facility];  Service: Ophthalmology;  Laterality: Left;   CATARACT EXTRACTION W/PHACO Right 07/14/2017   Procedure: CATARACT EXTRACTION PHACO AND INTRAOCULAR LENS PLACEMENT (IOC)  RIGHT;  Surgeon: Lockie Mola, MD;  Location: [facility] SUR[facility];  Service: Ophthalmology;  Laterality: Right;   CHOLECYSTECTOMY     COLONOSCOPY WITH PROPOFOL N/A 02/01/2022   Procedure: COLONOSCOPY WITH PROPOFOL;  Surgeon: Wyline Mood, MD;  Location: [facility] ENDOSCOPY;  Service: Gastroenterology;  Laterality: N/A;   CYSTOSCOPY/URETEROSCOPY/HOLMIUM LASER/STENT PLACEMENT Left 04/27/2021   Procedure: CYSTOSCOPY/URETEROSCOPY/HOLMIUM LASER/STENT PLACEMENT;  Surgeon: Sondra Come, MD;  Location: [facility] ORS;  Service: Urology;  Laterality: Left;   ESOPHAGOGASTRODUODENOSCOPY (EGD) WITH PROPOFOL N/A 10/13/2018   Procedure: ESOPHAGOGASTRODUODENOSCOPY (EGD) WITH PROPOFOL;  Surgeon: Wyline Mood, MD;  Location[facility] END[facility]COPY;  Service: Gastroenterology;  Laterality: N/A;   ESOPHAGOGASTRODUODENOSCOPY (EGD) WITH PROPOFOL N/A 11/22/2021   Procedure: ESOPHAGOGASTRODUODENOSCOPY (EGD) WITH PROPOFOL;  Surgeon: Wyline Mood, MD;  Location: [facility] ENDOSCOPY;  Service: Gastroenterology;  Laterality: N/A;   EYE SURGERY     FLEXIBLE  SIGMOIDOSCOPY N/A 10/13/2018   Procedure: FLEXIBLE SIGMOIDOSCOPY;  Surgeon: Wyline Mood, MD;  Location: [facility] ENDOSCOPY;  Service: Gastroenterology;  Laterality: N/A;   FLEXIBLE SIGMOIDOSCOPY N/A 09/09/2019   Procedure: FLEXIBLE SIGMOIDOSCOPY;  Surgeon: Wyline Mood, MD;  Location: [facility] ENDOSCOPY;  Service: Gastroenterology;  Laterality: N/A;   FLEXIBLE SIGMOIDOSCOPY N/A 08/04/2020   Procedure: FLEXIBLE SIGMOIDOSCOPY;  Surgeon: Wyline Mood, MD;  Location: [facility] ENDOSCOPY;  Service: Gastroenterology;  Laterality: N/A;  Per Dr. Tobi Bastos, unsedated   FLEXIBLE SIGMOIDOSCOPY N/A 08/31/2021   Procedure: FLEXIBLE SIGMOIDOSCOPY;  Surgeon: Wyline Mood, MD;  Location: [facility] ENDOSCOPY;  Service: Gastroenterology;  Laterality: N/A;  no sedation   HIP ARTHROPLASTY Right 09/27/2020   Procedure: ARTHROPLASTY BIPOLAR HIP (HEMIARTHROPLASTY);  Surgeon: Kennedy Bucker, MD;  Location: [facility] ORS;  Service: Orthopedics;  Laterality: Right;   JOINT REPLACEMENT     PULMONARY THROMBECTOMY N/A 05/08/2022   Procedure: PULMONARY THROMBECTOMY;  Surgeon: Annice Needy, MD;  Location: [facility] INVASIVE CV LAB;  Service: Cardiovascular;  Laterality: N/A;  ROTATOR CUFF REPAIR Bilateral    left-12/2009; right-03/2009 dr.califf   TONSILLECTOMY     TOTAL SHOULDER REPLACEMENT Left      FAMILY HISTORY   Family History  Problem Relation Age of Onset   COPD Mother    Cancer Brother        colon     SOCIAL HISTORY   Social History   Tobacco Use   Smoking status: Never   Smokeless tobacco: Never  Vaping Use   Vaping Use: Never used  Substance Use Topics   Alcohol use: No   Drug use: No     MEDICATIONS    Home Medication:     Current Medication:  Current Facility-Administered Medications:    0.9 %  sodium chloride infusion, 250 mL, Intravenous, PRN, Wyn Quaker, Marlow Baars, MD   acetaminophen (TYLENOL) tablet 650 mg, 650 mg, Oral, Q6H PRN, Enedina Finner, MD, 650 mg at 05/12/22 0341   acidophilus (RISAQUAD) capsule 2 capsule, 2  capsule, Oral, TID, Wyn Quaker, Marlow Baars, MD, 2 capsule at 05/12/22 2026   calcium carbonate (TUMS - dosed in mg elemental calcium) chewable tablet 200 mg of elemental calcium, 1 tablet, Oral, QID PRN, Enedina Finner, MD   Chlorhexidine Gluconate Cloth 2 % PADS 6 each, 6 each, Topical, Daily, Dew, Marlow Baars, MD, 6 each at 05/11/22 1045   citalopram (CELEXA) tablet 10 mg, 10 mg, Oral, Daily, Dew, Marlow Baars, MD, 10 mg at 05/12/22 1000   cyanocobalamin (VITAMIN B12) tablet 1,000 mcg, 1,000 mcg, Oral, Becky Sax, Jearl Klinefelter, MD, 1,000 mcg at 05/11/22 1610   diclofenac Sodium (VOLTAREN) 1 % topical gel 2 g, 2 g, Topical, TID PRN, Enedina Finner, MD   feeding supplement (BOOST / RESOURCE BREEZE) liquid 1 Container, 1 Container, Oral, TID BM, Enedina Finner, MD, 1 Container at 05/12/22 1800   levalbuterol (XOPENEX) nebulizer solution 0.63 mg, 0.63 mg, Nebulization, TID, Karna Christmas, Shenee Wignall, MD, 0.63 mg at 05/12/22 1832   ondansetron (ZOFRAN) injection 4 mg, 4 mg, Intravenous, Q6H PRN, Dew, Marlow Baars, MD   pantoprazole (PROTONIX) injection 40 mg, 40 mg, Intravenous, Q12H, [place], Teodoro K, MD, 40 mg at 05/12/22 2026   predniSONE (DELTASONE) tablet 50 mg, 50 mg, Oral, Q breakfast, Froilan Mclean, MD, 50 mg at 05/12/22 1001   sodium chloride flush (NS) 0.9 % injection 3 mL, 3 mL, Intravenous, Q12H, Dew, Marlow Baars, MD, 3 mL at 05/12/22 2027   sodium chloride flush (NS) 0.9 % injection 3 mL, 3 mL, Intravenous, PRN, Wyn Quaker, Marlow Baars, MD    ALLERGIES   Patient has no known allergies.     REVIEW OF SYSTEMS    Review of Systems:  Gen:  Denies  fever, sweats, chills weigh loss  HEENT: Denies blurred vision, double vision, ear pain, eye pain, hearing loss, nose bleeds, sore throat Cardiac:  No dizziness, chest pain or heaviness, chest tightness,edema Resp:   reports dyspnea chronically  Gi: Denies swallowing difficulty, stomach pain, nausea or vomiting, diarrhea, constipation, bowel incontinence Gu:  Denies bladder incontinence,  burning urine Ext:   Denies Joint pain, stiffness or swelling Skin: Denies  skin rash, easy bruising or bleeding or hives Endoc:  Denies polyuria, polydipsia , polyphagia or weight change Psych:   Denies depression, insomnia or hallucinations   Other:  All other systems negative   VS: BP 127/64 (BP Location: Right Arm)   Pulse 63   Temp 98 F (36.7 C) (Oral)   Resp (!) 26   Ht 4\' 10"  (1.473 m)  Wt 49.4 kg   SpO2 94%   BMI 22.78 kg/m      PHYSICAL EXAM    GENERAL:NAD, no fevers, chills, no weakness no fatigue HEAD: Normocephalic, atraumatic.  EYES: Pupils equal, round, reactive to light. Extraocular muscles intact. No scleral icterus.  MOUTH: Moist mucosal membrane. Dentition intact. No abscess noted.  EAR, NOSE, THROAT: Clear without exudates. No external lesions.  NECK: Supple. No thyromegaly. No nodules. No JVD.  PULMONARY: decreased breath sounds with mild rhonchi worse at bases bilaterally.  CARDIOVASCULAR: S1 and S2. Regular rate and rhythm. No murmurs, rubs, or gallops. No edema. Pedal pulses 2+ bilaterally.  GASTROINTESTINAL: Soft, nontender, nondistended. No masses. Positive bowel sounds. No hepatosplenomegaly.  MUSCULOSKELETAL: No swelling, clubbing, or edema. Range of motion full in all extremities.  NEUROLOGIC: Cranial nerves II through XII are intact. No gross focal neurological deficits. Sensation intact. Reflexes intact.  SKIN: No ulceration, lesions, rashes, or cyanosis. Skin warm and dry. Turgor intact.  PSYCHIATRIC: Mood, affect within normal limits. The patient is awake, alert and oriented x 3. Insight, judgment intact.       IMAGING     ASSESSMENT/PLAN   Acute on chronic hypoxemic respiratory failure    - abnormal CT chest with interval development of inflammatory changes as evidenced with serial CT chest above   - possible overlying interstitial edema   - RVP is negative and remainder of micro is negative   - ESR/CRP trending  -currently on  solumedrol 40 mg IV transitioning to prednisone 50mg  in am  - the situation is very difficult because she benefits from steroids with ulcerative colitis and ILD but it potentiates infection including Cdiff so therapy is further complicated   - stopping diuresis due to GI losses   - colitis seems to be mostly inflammatory , appreciate ID evaluation     Pulmonary fibrosis - chronic   - on esbriet - please continue as current  -PT /OT as able             Thank you for allowing me to participate in the care of this patient.   Patient/Family are satisfied with care plan and all questions have been answered.    Provider disclosure: Patient with at least one acute or chronic illness or injury that poses a threat to life or bodily function and is being managed actively during this encounter.  All of the below services have been performed independently by signing provider:  review of prior documentation from internal and or external health records.  Review of previous and current lab results.  Interview and comprehensive assessment during patient visit today. Review of current and previous chest radiographs/CT scans. Discussion of management and test interpretation with health care team and patient/family.   This document was prepared using Dragon voice recognition software and may include unintentional dictation errors.     Vida Rigger, M.D.  Division of Pulmonary & Critical Care Medicine

## 2022-05-14 DIAGNOSIS — J9601 Acute respiratory failure with hypoxia: Secondary | ICD-10-CM | POA: Diagnosis not present

## 2022-05-14 DIAGNOSIS — G894 Chronic pain syndrome: Secondary | ICD-10-CM | POA: Diagnosis not present

## 2022-05-14 DIAGNOSIS — J84112 Idiopathic pulmonary fibrosis: Secondary | ICD-10-CM | POA: Diagnosis not present

## 2022-05-14 DIAGNOSIS — J189 Pneumonia, unspecified organism: Secondary | ICD-10-CM | POA: Diagnosis not present

## 2022-05-14 LAB — C-REACTIVE PROTEIN: CRP: 1.2 mg/dL — ABNORMAL HIGH (ref <1.0)

## 2022-05-14 MED ORDER — PREDNISONE 20 MG PO TABS
40.0000 mg | ORAL_TABLET | Freq: Every day | ORAL | Status: DC
Start: 2022-05-15 — End: 2022-05-18
  Administered 2022-05-15 – 2022-05-18 (×4): 40 mg via ORAL
  Filled 2022-05-14 (×4): qty 2

## 2022-05-14 NOTE — Progress Notes (Signed)
Physical Therapy Treatment Patient Details Name: [name] MRN: [id number] DOB: Jul 12, 1936 Today's Date: 05/14/2022   History of Present Illness Admitted due to dyspnea and palpitations. Reports a decline of several months with acute worsening the past few days. Reports palpitations with minimal movement present for several weeks. No chest pain. Reports having to increase baseline home o2 of 2-3 liters to 4 liters lately, has had several months of hypoxia, and has worsened over past few weeks to being dyspneic at rest worse with minimal ambulation. Slight increased cough, not productive. Imaging revals PE. Per Dr. Fran Lowes it is small and she is ok to participate with PT.    PT Comments    Pt received in bed with O2 at 5L. SPO2 89% resting and agreeable to participate in therapy. Pt able performed exs in bed with ample rest breaks because pt's SPO2 desaturates in low 70s with minimal exertion. Pt education regarding PLB and pt demonstrated good understanding. Performing PLB is also exertion for pt which desaturates the O2 level. Pt Supine<-> sit and sat on the EOB for 30 secs. Pt position to L with pillow to improve breathing and prevent skin breakdown. Pt demonstrates poor aerobic capacity which decreases pt's functional abilities. Pt will benefit from HHPT after acute care.   Recommendations for follow up therapy are one component of a multi-disciplinary discharge planning process, led by the attending physician.  Recommendations may be updated based on patient status, additional functional criteria and insurance authorization.  Follow Up Recommendations  Follow physician's recommendations for discharge plan and follow up therapies     Assistance Recommended at Discharge Intermittent Supervision/Assistance  Patient can return home with the following A little help with walking and/or transfers;A little help with bathing/dressing/bathroom;Assist for transportation;Assistance with  cooking/housework   Equipment Recommendations  None recommended by PT    Recommendations for Other Services       Precautions / Restrictions Precautions Precautions: Fall;Other (comment) (SPO2) Restrictions Weight Bearing Restrictions: No     Mobility  Bed Mobility Overal bed mobility: Needs Assistance Bed Mobility: Supine to Sit, Sit to Supine Rolling: Supervision   Supine to sit: Min assist Sit to supine: Min assist   General bed mobility comments: needs assit due to poor O2 saturation.    Transfers Overall transfer level: Needs assistance                 General transfer comment: unable to tolerate.    Ambulation/Gait; Unable                   Stairs             Wheelchair Mobility    Modified Rankin (Stroke Patients Only)       Balance Overall balance assessment: Needs assistance Sitting-balance support: Feet unsupported, No upper extremity supported Sitting balance-Leahy Scale: Good Sitting balance - Comments: no balance deficits in sitting                                    Cognition Arousal/Alertness: Awake/alert Behavior During Therapy: WFL for tasks assessed/performed Overall Cognitive Status: Within Functional Limits for tasks assessed                                 General Comments: very pleasant and agreeable to session        Exercises General  Exercises - Lower Extremity Ankle Circles/Pumps: AROM, 10 reps, Supine Gluteal Sets: Strengthening, 5 reps, Supine Other Exercises Other Exercises: sat on EON for 30 secs with CGA to sup with cues for PLBs    General Comments        Pertinent Vitals/Pain Pain Assessment Pain Assessment: No/denies pain    Home Living                          Prior Function            PT Goals (current goals can now be found in the care plan section) Acute Rehab PT Goals Patient Stated Goal: hopefully home PT Goal Formulation: With  patient Time For Goal Achievement: 05/17/22 Potential to Achieve Goals: Fair Progress towards PT goals: Progressing toward goals    Frequency    Min 2X/week      PT Plan Current plan remains appropriate    Co-evaluation              AM-PAC PT "6 Clicks" Mobility   Outcome Measure  Help needed turning from your back to your side while in a flat bed without using bedrails?: None Help needed moving from lying on your back to sitting on the side of a flat bed without using bedrails?: A Little Help needed moving to and from a bed to a chair (including a wheelchair)?: A Little Help needed standing up from a chair using your arms (e.g., wheelchair or bedside chair)?: A Lot Help needed to walk in hospital room?: A Lot Help needed climbing 3-5 steps with a railing? : Total 6 Click Score: 15    End of Session Equipment Utilized During Treatment: Oxygen Activity Tolerance: Patient limited by fatigue Patient left: in bed;with call bell/phone within reach;with bed alarm set;with family/visitor present Nurse Communication: Mobility status (SPO2 levels) PT Visit Diagnosis: Other abnormalities of gait and mobility (R26.89);Muscle weakness (generalized) (M62.81)     Time: 5784-6962 PT Time Calculation (min) (ACUTE ONLY): 23 min  Charges:  $Therapeutic Activity: 23-37 mins                    Gabriella Woodhead PT DPT 5:30 PM,05/14/22    Alexiz Cothran H Marija Calamari 05/14/2022, 5:25 PM

## 2022-05-14 NOTE — Progress Notes (Signed)
[facility] [place] NAME: [name]    MR#:  [id number]  DATE OF BIRTH:  1935-09-22  SUBJECTIVE:   Patient is oxygen requirement went down to high flow nasal cannula 6 L oxygen.no family at bedside  VITALS:  Blood pressure 115/64, pulse 74, temperature 98.6 F (37 C), temperature source Oral, resp. rate (!) 22, height 4' 10"  (1.473 m), weight 49.4 kg, SpO2 91 %.  PHYSICAL EXAMINATION:   GENERAL:  86 y.o.-year-old patient lying in the bed with no acute distress. Chronically ill. Thin, fraile LUNGS: distant breath sounds bilaterally  CARDIOVASCULAR: S1, S2 normal. No murmurs. Sinus rhythm ABDOMEN: Soft, nontender, nondistended. Bowel sounds present.  EXTREMITIES: No  edema b/l.    NEUROLOGIC: nonfocal  patient is alert and awake, weak SKIN: per RN  LABORATORY PANEL:  CBC Recent Labs  Lab 05/08/22 0536 05/08/22 1837  WBC 9.4  --   HGB 7.8* 10.3*  HCT 24.9* 31.1*  PLT 380  --      Chemistries  Recent Labs  Lab 05/09/22 0824  NA 139  K 4.6  CL 94*  CO2 34*  GLUCOSE 109*  BUN 25*  CREATININE 1.06*  CALCIUM 9.0  MG 2.0    Cardiac Enzymes No results for input(s): "TROPONINI" in the last 168 hours. RADIOLOGY:  DG Chest Port 1 View  Result Date: 05/13/2022 CLINICAL DATA:  Infiltrate of the lung EXAM: PORTABLE CHEST 1 VIEW COMPARISON:  05/09/2022 FINDINGS: Unchanged AP portable chest radiograph. Heart size is normal. Diffuse bilateral interstitial and heterogeneous airspace opacity, unchanged. No new or focal airspace opacity. Status post left shoulder reverse arthroplasty. IMPRESSION: Unchanged AP portable chest radiograph. Diffuse bilateral interstitial and heterogeneous airspace opacity, unchanged, consistent with edema and or infection. No new or focal airspace opacity. Electronically Signed   By: Delanna Ahmadi M.D.   On: 05/13/2022 08:13    Assessment and Plan  [name] is a 86 y.o. female with medical  history significant for ILD, recurrent c diff, ulcerative colitis, chronic pain, chronic hypoxic respiratory failure, who presented with dyspnea and palpitations.   Reports a decline of several months with acute worsening the days PTA.  Reports having to increase baseline home o2 of 2-3 liters to 4 liters lately, has had several months of hypoxia, and has worsened over past few weeks to being dyspneic at rest worse with minimal ambulation.    Acute on chronic hypoxemic respiratory failure --recently, pt has been on 4-5L home O2.   --covid and RVP neg.  likely progressive ILD, pulm edema, and small PE contributing.  Questionable PNA.  Started on tx for all 4 etiologies on admission. --Continue supplemental O2 to keep sats between 88-92%, wean as tolerated -- chest physiotherapy started   ILD Underlying severe ILD that is likely progressing but at least several days of acutely worsening dyspnea.  --started on prednisone 50 mg daily on admission and escalated up to IV solumedrol. --pulm consulted, Dr. Lanney Gins --cont IV solumedrol 40 mg daily, per pulm--changed to po steroid 45 mg qd --Albuterol neb scheduled, per pulm --wean oxygen slowly down   Bloody Diarrhea, worsening H/o UC --C diff neg on presentation.  Likely due to abx pt received during the first 2 days. --GI consult with Dr. [name]-- recommends avoid antidiarrheals, follow-up with Dr. [name].I. after hospital discharge.  --d/c Eliquis  Acute blood loss anemia --Hgb dropped from 12.5 to 8.7 in 3 days from bloody diarrhea --Monitor Hgb  and transfuse to keep Hgb >8 --hemoglobin 7.8 will give 1 unit of blood transfusion.  --came in with hgb 12.8---7.8--1 unit BT--10.3  Pulmonary embolus, acute Small on CT, doubt it's the main driver for her dyspnea.   --d/c Eliquis due to bloody diarrhea --s/p Mechanical thrombectomy and IVC filter per Dr Lucky Cowboy    AKI --Cr went up to 1.68, from baseline of 0.7 --likely 2/2 to dehydration from  diarrhea --improved with IVF --received IVF--creat down to 1.0   Afib w RVR --pt remains in SR--will d/c amiodarone gtt for now    Acute on chronic diastolic CHF --pulm edema seen on CT, elevated BNP --started on IV lasix 40 mg daily, d/c'ed on 9/2 --hold further diuretics due to diarrhea --will give prn lasix   CAP, ruled out --ID consulted, and ruled out PNA, abx d/c'ed after 2 days.   History recurrent C diff --has multiple BM's per day, sometimes loose, but pt said that's her baseline --C diff neg on presentation   Chronic pain - home norco prn   Hypokalemia --likely due to diarrhea --monitor and replete PRN      DVT prophylaxis: SCD/Compression stockings Code Status: DNR--pt d/w Dr Lanney Gins Family Communication: none Level of care: Progressive Dispo:   The patient is from: home patient remains on high flow nasal care. Improving slowly. Will discharged to home once oxygen requirement is down to 5 to 6 L nasal cannula  PT/OT consulted--recommends HHPT  Palliative care input appreciated     TOTAL TIME TAKING CARE OF THIS PATIENT: 35 minutes.  >50% time spent on counselling and coordination of care  Note: This dictation was prepared with Dragon dictation along with smaller phrase technology. Any transcriptional errors that result from this process are unintentional.  Fritzi Mandes M.D    [facility]   CC: Primary care physician; [name], NP

## 2022-05-14 NOTE — Progress Notes (Signed)
Palliative Care Progress Note, Assessment & Plan   Patient Name: [name]       Date: 05/14/2022 DOB: [birth year]  Age: 86 y.o. MRN#: [id number] Attending Physician: Fritzi Mandes, MD Primary Care Physician: [name], NP Admit Date: 05/01/2022  Reason for Consultation/Follow-up: Establishing goals of care  Subjective: Patient is sitting in bed in no apparent distress.  She acknowledges my presence and is able to make her wishes known.  6 L nasal cannula in place with no signs of increased work of breathing, shortness of breath, or air hunger.  HPI: 86 y.o. female  with past medical history of ulcerative colitis, ILD with pulmonary fibrosis, chronic pain syndrome, and acute on chronic hypoxemia admitted on 05/01/2022 with increasing oxygen requirements at home with worsening cough.   Summary of counseling/coordination of care: After reviewing the patient's chart and assessing the patient at bedside, I spoke with patient and her husband [name] at bedside in regards to goals of care.  Plan reduce palliative medicine to patient's husband as medical care for people living with chronic, serious illnesses.  I outlined that PMT's role is to focus on providing relief from the symptoms and stress of a serious illness.  PMT's goal is to improve quality of life for both the patient and the family.  Discussed symptom management as well as discussion of goals of care.  I conveyed details of the discussion that I had with patient yesterday to her husband.  DNR/DNI remains.  Patient's husband shared he understands that her lower lungs are not functioning and that medical treatments are aimed to optimize the reduced amount of lung capacity the patient currently has.  Discussed chronic/progressive nature of ILD  with pulmonary fibrosis.  Reviewed treating not only the patient but the person.  Reviewed quality versus quantity of life.  Outpatient palliative and hospice services discussed.  Brief outline of hospice philosophy and role in focusing on dignified, compassionate aging in place.  Patient and husband are in agreement to continue to treat the treatable.  Patient shares that if she is unable to manage her oxygen levels and symptoms at home that she would like to return to the hospital.  Plan is for patient to wean down as much as possible and supplemental oxygen and return home with home health and outpatient palliative services.  Outpatient palliative referral placed.  PMT will continue to monitor the patient throughout her hospitalization.  We will shadow her chart and reengage at patient/family's request, if patient's health status deteriorates, or if goals change.  Code Status: DNR  Discharge Planning: Home with Palliative Services  Physical Exam Vitals reviewed.  Constitutional:      General: She is not in acute distress.    Appearance: She is normal weight. She is not ill-appearing.  HENT:     Head: Normocephalic.  Pulmonary:     Effort: Tachypnea present.     Breath sounds: Examination of the right-middle field reveals decreased breath sounds. Examination of the right-lower field reveals decreased breath sounds. Examination of the left-lower field reveals decreased breath sounds. Decreased breath sounds present.     Comments: Mild dyspnea at rest Abdominal:     Palpations: Abdomen is  soft.  Musculoskeletal:     Cervical back: Normal range of motion.     Comments: Dyspnea with more than a few sentences of conversation and with movement  Neurological:     Mental Status: She is alert and oriented to person, place, and time.  Psychiatric:        Mood and Affect: Mood normal. Mood is not anxious.        Behavior: Behavior normal. Behavior is not agitated.              Palliative Assessment/Data: 50%    Total Time 35 minutes  Greater than 50%  of this time was spent counseling and coordinating care related to the above assessment and plan.  Thank you for allowing the Palliative Medicine Team to assist in the care of this patient.  [place] Ilsa Iha, FNP-BC Palliative Medicine Team Team Phone # [phone]

## 2022-05-14 NOTE — Progress Notes (Signed)
PULMONOLOGY         Date: 05/14/2022,   MRN# [id number] [name] 03-05-1936     AdmissionWeight: 49.4 kg                 CurrentWeight: 49.4 kg  Referring provider: [name]   CHIEF COMPLAINT:   Acute on chronic hypoxemic respiratory failure   HISTORY OF PRESENT ILLNESS   This is a pleasant 86 yo F with hx of UC, ILD with Pulmonary fibrosis, chronic pain syndrome, who came in due to acute on chronic hypoxemia. She reports increased O2 requirement at home with worsening cough, she denies flu like illness or fevers.  Labwork reveals elevated BNP   05/02/22 - patient is improved clinically, RVP is negative , does not appear to have ongoing infection s/p ID eval with de-escalation of antimicrobials. Placed on eliquis for acute PE with mild clot burden.  CRP and ESR was not collected and I have reordered this. She is nontachypneic during exam speaking in full sentences. Mild hypokalemia on labs has been repleted and repleted magnesium as well.    05/04/22- Patient is a bit uncomfortable this am. She reports worsening diarreah and abd discomfort.  She had 5 loose stools overnight. She has not been able to wean down much from yesterday on O2. We repleted her K but due to GI losses she has hypokalemia still.  Will obtain pharmacy consult to help with ongong electrolyte shift.  Reduced steroids to 60IV once daily, have added TID scheduled tussinex 2.75mL to help with diarreah since we think its non-infectious.  CXR done and reviewed this AM , there are diffuse bilateral infiltrates but improved from rior.   05/07/22- patient had several bloody episodes of loose stools. She thinks today this is slightly better. Her inflammatory biomarkers are still elevated. Her overall presentation seems like a UC patient with COVID19 infection, I wonder if she has an undetectable strain with current testing platform.  O2 requirement is unchanged from 2 days ago.  Im considering vascular surgery  consult for thromectomy evaluation since we have not had much more improvemetn over past 48h and unable to use blood thinners any longer due to onging GI bleeds with UC. Fungitell is still inprocess , remainder of infectiuos workup is negative. I have reduced steroids today. To 40mg  solumedrol  05/08/22-patient is improved on 10L/min [place]. She has thrombectomy planned for today. Will keep solumedrol at 40mg  IV daily. She reports feeling better and is smiling during my evaluation.  Husband at bedside we reivewed medical plan.    05/09/22- patient is on HFNC post thrombectomy, she had a setback post operatively may be due to physical stress.  She does have atelectasis on cxr and we have initiated metaneb q8h with saline. Additional covid testing in process due to clinical suspicion of false negative.   05/10/22- patient is improved slightly and has been weaned from HFNC from 75% to 65%.  We changed code status to DNR today.   05/11/22- patient is slightly improved with less O2 requirement and weaning in process.  Have reduced steroids to PO prednisone 50mg . Plan to continue current plan and start PT.  Plan Reviewed with attending physician.   05/13/22- patient feels slightly improved, plan today to get OOB more, her loose stools have essentially resolved.  No new events.  Her spO2 goal is anything >80%.  CXR repeat today.  Reduced PO prednisone  PAST MEDICAL HISTORY   Past Medical History:  Diagnosis Date   Altered mental state    Anginal pain (HCC)    Anxiety    Basal cell carcinoma 05/03/2021   right neck postauricular - Excised 06/12/21   BMI 30.0-30.9,adult    Cellulitis    Chest pain, atypical    Compression fracture of L4 lumbar vertebra    Constipation    COVID-19 virus infection 05/28/2020   Cystitis    Cystocele    Diverticulosis    Fatigue    Fibrosis, idiopathic pulmonary (HCC)    Gastroenteritis    History of back surgery    History of herniated intervertebral disc    HTN  (hypertension)    Hypercholesteremia    Hypocalcemia    Incontinence of urine    OA (osteoarthritis)    shoulder and back   Obesity    Osteopenia    Pneumonia    Shortness of breath    Sleep apnea    Squamous cell carcinoma of skin 07/16/2016   Right upper lateral eyebrow. SCCis.   Squamous cell carcinoma of skin 01/13/2018   Left temporal hairline. WD SCC with superficial infiltration.   Thrush    Vitamin D deficiency      SURGICAL HISTORY   Past Surgical History:  Procedure Laterality Date   BACK SURGERY     CATARACT EXTRACTION W/PHACO Left 04/24/2016   Procedure: CATARACT EXTRACTION PHACO AND INTRAOCULAR LENS PLACEMENT (IOC);  Surgeon: Lockie Mola, MD;  Location: [facility];  Service: Ophthalmology;  Laterality: Left;   CATARACT EXTRACTION W/PHACO Right 07/14/2017   Procedure: CATARACT EXTRACTION PHACO AND INTRAOCULAR LENS PLACEMENT (IOC)  RIGHT;  Surgeon: Lockie Mola, MD;  Location: [facility];  Service: Ophthalmology;  Laterality: Right;   CHOLECYSTECTOMY     COLONOSCOPY WITH PROPOFOL N/A 02/01/2022   Procedure: COLONOSCOPY WITH PROPOFOL;  Surgeon: Wyline Mood, MD;  Location: [facility] ENDOSCOPY;  Service: Gastroenterology;  Laterality: N/A;   CYSTOSCOPY/URETEROSCOPY/HOLMIUM LASER/STENT PLACEMENT Left 04/27/2021   Procedure: CYSTOSCOPY/URETEROSCOPY/HOLMIUM LASER/STENT PLACEMENT;  Surgeon: Sondra Come, MD;  Location: [facility] ORS;  Service: Urology;  Laterality: Left;   ESOPHAGOGASTRODUODENOSCOPY (EGD) WITH PROPOFOL N/A 10/13/2018   Procedure: ESOPHAGOGASTRODUODENOSCOPY (EGD) WITH PROPOFOL;  Surgeon: Wyline Mood, MD;  Location: [facility] ENDOSCOPY;  Service: Gastroenterology;  Laterality: N/A;   ESOPHAGOGASTRODUODENOSCOPY (EGD) WITH PROPOFOL N/A 11/22/2021   Procedure: ESOPHAGOGASTRODUODENOSCOPY (EGD) WITH PROPOFOL;  Surgeon: Wyline Mood, MD;  Location: [facility] ENDOSCOPY;  Service: Gastroenterology;  Laterality: N/A;   EYE SURGERY     FLEXIBLE  SIGMOIDOSCOPY N/A 10/13/2018   Procedure: FLEXIBLE SIGMOIDOSCOPY;  Surgeon: Wyline Mood, MD;  Location: [facility] ENDOSCOPY;  Service: Gastroenterology;  Laterality: N/A;   FLEXIBLE SIGMOIDOSCOPY N/A 09/09/2019   Procedure: FLEXIBLE SIGMOIDOSCOPY;  Surgeon: Wyline Mood, MD;  Location: [facility] ENDOSCOPY;  Service: Gastroenterology;  Laterality: N/A;   FLEXIBLE SIGMOIDOSCOPY N/A 08/04/2020   Procedure: FLEXIBLE SIGMOIDOSCOPY;  Surgeon: Wyline Mood, MD;  Location: [facility] ENDOSCOPY;  Service: Gastroenterology;  Laterality: N/A;  Per Dr. Tobi Bastos, unsedated   FLEXIBLE SIGMOIDOSCOPY N/A 08/31/2021   Procedure: FLEXIBLE SIGMOIDOSCOPY;  Surgeon: Wyline Mood, MD;  Location: [facility] ENDOSCOPY;  Service: Gastroenterology;  Laterality: N/A;  no sedation   HIP ARTHROPLASTY Right 09/27/2020   Procedure: ARTHROPLASTY BIPOLAR HIP (HEMIARTHROPLASTY);  Surgeon: Kennedy Bucker, MD;  Location: [facility] ORS;  Service: Orthopedics;  Laterality: Right;   JOINT REPLACEMENT     PULMONARY THROMBECTOMY N/A 05/08/2022   Procedure: PULMONARY THROMBECTOMY;  Surgeon: Annice Needy, MD;  Location: [facility] INVASIVE CV LAB;  Service: Cardiovascular;  Laterality: N/A;  ROTATOR CUFF REPAIR Bilateral    left-12/2009; right-03/2009 dr.califf   TONSILLECTOMY     TOTAL SHOULDER REPLACEMENT Left      FAMILY HISTORY   Family History  Problem Relation Age of Onset   COPD Mother    Cancer Brother        colon     SOCIAL HISTORY   Social History   Tobacco Use   Smoking status: Never   Smokeless tobacco: Never  Vaping Use   Vaping Use: Never used  Substance Use Topics   Alcohol use: No   Drug use: No     MEDICATIONS    Home Medication:     Current Medication:  Current Facility-Administered Medications:    0.9 %  sodium chloride infusion, 250 mL, Intravenous, PRN, Wyn Quaker, Marlow Baars, MD   acetaminophen (TYLENOL) tablet 650 mg, 650 mg, Oral, Q6H PRN, Enedina Finner, MD, 650 mg at 05/12/22 0341   acidophilus (RISAQUAD) capsule 2 capsule, 2  capsule, Oral, TID, [name], Marlow Baars, MD, 2 capsule at 05/14/22 1618   calcium carbonate (TUMS - dosed in mg elemental calcium) chewable tablet 200 mg of elemental calcium, 1 tablet, Oral, QID PRN, Enedina Finner, MD   Chlorhexidine Gluconate Cloth 2 % PADS 6 each, 6 each, Topical, Daily, [name], Marlow Baars, MD, 6 each at 05/14/22 0920   citalopram (CELEXA) tablet 10 mg, 10 mg, Oral, Daily, [name], Marlow Baars, MD, 10 mg at 05/14/22 1610   cyanocobalamin (VITAMIN B12) tablet 1,000 mcg, 1,000 mcg, Oral, Becky Sax, Jearl Klinefelter, MD, 1,000 mcg at 05/13/22 0850   diclofenac Sodium (VOLTAREN) 1 % topical gel 2 g, 2 g, Topical, TID PRN, Enedina Finner, MD   feeding supplement (BOOST / RESOURCE BREEZE) liquid 1 Container, 1 Container, Oral, TID BM, Enedina Finner, MD, 1 Container at 05/14/22 1431   levalbuterol (XOPENEX) nebulizer solution 0.63 mg, 0.63 mg, Nebulization, TID, Karna Christmas, Jeannemarie Sawaya, MD, 0.63 mg at 05/14/22 1331   ondansetron (ZOFRAN) injection 4 mg, 4 mg, Intravenous, Q6H PRN, [name], Marlow Baars, MD   pantoprazole (PROTONIX) injection 40 mg, 40 mg, Intravenous, Q12H, [place], Teodoro K, MD, 40 mg at 05/14/22 0919   [START ON 05/15/2022] predniSONE (DELTASONE) tablet 40 mg, 40 mg, Oral, Q breakfast, Syesha Thaw, MD   sodium chloride flush (NS) 0.9 % injection 3 mL, 3 mL, Intravenous, Q12H, [name], Marlow Baars, MD, 3 mL at 05/14/22 0920   sodium chloride flush (NS) 0.9 % injection 3 mL, 3 mL, Intravenous, PRN, [name], Marlow Baars, MD    ALLERGIES   Patient has no known allergies.     REVIEW OF SYSTEMS    Review of Systems:  Gen:  Denies  fever, sweats, chills weigh loss  HEENT: Denies blurred vision, double vision, ear pain, eye pain, hearing loss, nose bleeds, sore throat Cardiac:  No dizziness, chest pain or heaviness, chest tightness,edema Resp:   reports dyspnea chronically  Gi: Denies swallowing difficulty, stomach pain, nausea or vomiting, diarrhea, constipation, bowel incontinence Gu:  Denies bladder incontinence, burning  urine Ext:   Denies Joint pain, stiffness or swelling Skin: Denies  skin rash, easy bruising or bleeding or hives Endoc:  Denies polyuria, polydipsia , polyphagia or weight change Psych:   Denies depression, insomnia or hallucinations   Other:  All other systems negative   VS: BP 103/68 (BP Location: Right Arm)   Pulse 81   Temp 98.4 F (36.9 C) (Oral)   Resp (!) 28   Ht 4\' 10"  (1.473 m)   Wt  49.4 kg   SpO2 (!) 88%   BMI 22.78 kg/m      PHYSICAL EXAM    GENERAL:NAD, no fevers, chills, no weakness no fatigue HEAD: Normocephalic, atraumatic.  EYES: Pupils equal, round, reactive to light. Extraocular muscles intact. No scleral icterus.  MOUTH: Moist mucosal membrane. Dentition intact. No abscess noted.  EAR, NOSE, THROAT: Clear without exudates. No external lesions.  NECK: Supple. No thyromegaly. No nodules. No JVD.  PULMONARY: decreased breath sounds with mild rhonchi worse at bases bilaterally.  CARDIOVASCULAR: S1 and S2. Regular rate and rhythm. No murmurs, rubs, or gallops. No edema. Pedal pulses 2+ bilaterally.  GASTROINTESTINAL: Soft, nontender, nondistended. No masses. Positive bowel sounds. No hepatosplenomegaly.  MUSCULOSKELETAL: No swelling, clubbing, or edema. Range of motion full in all extremities.  NEUROLOGIC: Cranial nerves II through XII are intact. No gross focal neurological deficits. Sensation intact. Reflexes intact.  SKIN: No ulceration, lesions, rashes, or cyanosis. Skin warm and dry. Turgor intact.  PSYCHIATRIC: Mood, affect within normal limits. The patient is awake, alert and oriented x 3. Insight, judgment intact.       IMAGING     ASSESSMENT/PLAN   Acute on chronic hypoxemic respiratory failure    - abnormal CT chest with interval development of inflammatory changes as evidenced with serial CT chest above   - possible overlying interstitial edema   - RVP is negative and remainder of micro is negative   - ESR/CRP trending  -currently on  solumedrol 40 mg IV transitioning to prednisone 50mg  in am  - the situation is very difficult because she benefits from steroids with ulcerative colitis and ILD but it potentiates infection including Cdiff so therapy is further complicated   - stopping diuresis due to GI losses   - colitis seems to be mostly inflammatory , appreciate ID evaluation     Pulmonary fibrosis - chronic   - on esbriet - please continue as current  -PT /OT as able             Thank you for allowing me to participate in the care of this patient.   Patient/Family are satisfied with care plan and all questions have been answered.    Provider disclosure: Patient with at least one acute or chronic illness or injury that poses a threat to life or bodily function and is being managed actively during this encounter.  All of the below services have been performed independently by signing provider:  review of prior documentation from internal and or external health records.  Review of previous and current lab results.  Interview and comprehensive assessment during patient visit today. Review of current and previous chest radiographs/CT scans. Discussion of management and test interpretation with health care team and patient/family.   This document was prepared using Dragon voice recognition software and may include unintentional dictation errors.     Vida Rigger, M.D.  Division of Pulmonary & Critical Care Medicine

## 2022-05-15 DIAGNOSIS — J9601 Acute respiratory failure with hypoxia: Secondary | ICD-10-CM | POA: Diagnosis not present

## 2022-05-15 MED ORDER — SPIRONOLACTONE 25 MG PO TABS
25.0000 mg | ORAL_TABLET | Freq: Every day | ORAL | Status: AC
Start: 2022-05-15 — End: 2022-05-16
  Administered 2022-05-15 – 2022-05-16 (×2): 25 mg via ORAL
  Filled 2022-05-15 (×2): qty 1

## 2022-05-15 MED ORDER — TORSEMIDE 20 MG PO TABS
20.0000 mg | ORAL_TABLET | Freq: Every day | ORAL | Status: AC
  Administered 2022-05-15 – 2022-05-16 (×2): 20 mg via ORAL
  Filled 2022-05-15 (×2): qty 1

## 2022-05-15 NOTE — Progress Notes (Addendum)
PT Cancellation Note  Patient Details Name: [name] MRN: [id number] DOB: [birth year]   Cancelled Treatment:    7125 pm PT attempt. 2nd attempt today. First attempt was early in the A.M and pt requested author return after lunch. Arrived after lunch as requested however pt is recovering from sitting EOB to eat. She desaturates into 70s and required O2 to be increased to 10 L from 4 L. Requested more time to recover prior to PT session. Will return at 2:30 pm as requested.   2:15 pm pt getting breathing treatment. Will return shortly to progress pt as able.   3:30pm: Pt was long sitting in bed with MD at bedside. Pt's sao2 dropping into 70s with just talking. RR into 40s. Increased O2 to 12L then 15 L to recovery. Prolonged recovery time required. Was able to wean back to 10 L  with RN aware.   Willette Pa 05/15/2022, 12:47 PM

## 2022-05-15 NOTE — Progress Notes (Signed)
PULMONOLOGY         Date: 05/15/2022,   MRN# [id number] Terri SCHAUMAN 05-12-1936     AdmissionWeight: 49.4 kg                 CurrentWeight: 49.4 kg  Referring provider: [name]   CHIEF COMPLAINT:   Acute on chronic hypoxemic respiratory failure   HISTORY OF PRESENT ILLNESS   This is a pleasant 86 yo F with hx of UC, ILD with Pulmonary fibrosis, chronic pain syndrome, who came in due to acute on chronic hypoxemia. She reports increased O2 requirement at home with worsening cough, she denies flu like illness or fevers.  Labwork reveals elevated BNP   05/02/22 - patient is improved clinically, RVP is negative , does not appear to have ongoing infection s/p ID eval with de-escalation of antimicrobials. Placed on eliquis for acute PE with mild clot burden.  CRP and ESR was not collected and I have reordered this. She is nontachypneic during exam speaking in full sentences. Mild hypokalemia on labs has been repleted and repleted magnesium as well.    05/04/22- Patient is a bit uncomfortable this am. She reports worsening diarreah and abd discomfort.  She had 5 loose stools overnight. She has not been able to wean down much from yesterday on O2. We repleted her K but due to GI losses she has hypokalemia still.  Will obtain pharmacy consult to help with ongong electrolyte shift.  Reduced steroids to 60IV once daily, have added TID scheduled tussinex 2.1mL to help with diarreah since we think its non-infectious.  CXR done and reviewed this AM , there are diffuse bilateral infiltrates but improved from rior.   05/07/22- patient had several bloody episodes of loose stools. She thinks today this is slightly better. Her inflammatory biomarkers are still elevated. Her overall presentation seems like a UC patient with COVID19 infection, I wonder if she has an undetectable strain with current testing platform.  O2 requirement is unchanged from 2 days ago.  Im considering vascular surgery  consult for thromectomy evaluation since we have not had much more improvemetn over past 48h and unable to use blood thinners any longer due to onging GI bleeds with UC. Fungitell is still inprocess , remainder of infectiuos workup is negative. I have reduced steroids today. To 40mg  solumedrol  05/08/22-patient is improved on 10L/min [place]. She has thrombectomy planned for today. Will keep solumedrol at 40mg  IV daily. She reports feeling better and is smiling during my evaluation.  Husband at bedside we reivewed medical plan.    05/09/22- patient is on HFNC post thrombectomy, she had a setback post operatively may be due to physical stress.  She does have atelectasis on cxr and we have initiated metaneb q8h with saline. Additional covid testing in process due to clinical suspicion of false negative.   05/10/22- patient is improved slightly and has been weaned from HFNC from 75% to 65%.  We changed code status to DNR today.   05/11/22- patient is slightly improved with less O2 requirement and weaning in process.  Have reduced steroids to PO prednisone 50mg . Plan to continue current plan and start PT.  Plan Reviewed with attending physician.   05/13/22- patient feels slightly improved, plan today to get OOB more, her loose stools have essentially resolved.  No new events.  Her spO2 goal is anything >80%.  CXR repeat today.  Reduced PO prednisone  05/14/22- patient is weaned to 6L/min and is working  with PT to get out of bed, she took few steps from bed to chair today.    05/15/22 - patient is on 6L/m [place] she had mild resp distress with ambulation. She is in no distress.   05/16/22- patient is on 10L/min she did diurese better overnight but not much improved on her O2 therapy.  She is not ready for hospice, she wants to keep fighting for now.  I spoke with [facility] regarding LTACH palcement.  Patient profile will be reviewed for this. Will dc diuretics today.   PAST MEDICAL HISTORY   Past Medical History:   Diagnosis Date   Altered mental state    Anginal pain (HCC)    Anxiety    Basal cell carcinoma 05/03/2021   right neck postauricular - Excised 06/12/21   BMI 30.0-30.9,adult    Cellulitis    Chest pain, atypical    Compression fracture of L4 lumbar vertebra    Constipation    COVID-19 virus infection 05/28/2020   Cystitis    Cystocele    Diverticulosis    Fatigue    Fibrosis, idiopathic pulmonary (HCC)    Gastroenteritis    History of back surgery    History of herniated intervertebral disc    HTN (hypertension)    Hypercholesteremia    Hypocalcemia    Incontinence of urine    OA (osteoarthritis)    shoulder and back   Obesity    Osteopenia    Pneumonia    Shortness of breath    Sleep apnea    Squamous cell carcinoma of skin 07/16/2016   Right upper lateral eyebrow. SCCis.   Squamous cell carcinoma of skin 01/13/2018   Left temporal hairline. WD SCC with superficial infiltration.   Thrush    Vitamin D deficiency      SURGICAL HISTORY   Past Surgical History:  Procedure Laterality Date   BACK SURGERY     CATARACT EXTRACTION W/PHACO Left 04/24/2016   Procedure: CATARACT EXTRACTION PHACO AND INTRAOCULAR LENS PLACEMENT (IOC);  Surgeon: Lockie Mola, MD;  Location: [facility];  Service: Ophthalmology;  Laterality: Left;   CATARACT EXTRACTION W/PHACO Right 07/14/2017   Procedure: CATARACT EXTRACTION PHACO AND INTRAOCULAR LENS PLACEMENT (IOC)  RIGHT;  Surgeon: Lockie Mola, MD;  Location: [facility];  Service: Ophthalmology;  Laterality: Right;   CHOLECYSTECTOMY     COLONOSCOPY WITH PROPOFOL N/A 02/01/2022   Procedure: COLONOSCOPY WITH PROPOFOL;  Surgeon: Wyline Mood, MD;  Location: [facility];  Service: Gastroenterology;  Laterality: N/A;   CYSTOSCOPY/URETEROSCOPY/HOLMIUM LASER/STENT PLACEMENT Left 04/27/2021   Procedure: CYSTOSCOPY/URETEROSCOPY/HOLMIUM LASER/STENT PLACEMENT;  Surgeon: Sondra Come, MD;  Location: [facility];   Service: Urology;  Laterality: Left;   ESOPHAGOGASTRODUODENOSCOPY (EGD) WITH PROPOFOL N/A 10/13/2018   Procedure: ESOPHAGOGASTRODUODENOSCOPY (EGD) WITH PROPOFOL;  Surgeon: Wyline Mood, MD;  Location: [facility];  Service: Gastroenterology;  Laterality: N/A;   ESOPHAGOGASTRODUODENOSCOPY (EGD) WITH PROPOFOL N/A 11/22/2021   Procedure: ESOPHAGOGASTRODUODENOSCOPY (EGD) WITH PROPOFOL;  Surgeon: Wyline Mood, MD;  Location: [facility] END[facility]COPY;  Service: Gastroenterology;  Laterality: N/A;   EYE SURGERY     FLEXIBLE SIGMOIDOSCOPY N/A 10/13/2018   Procedure: FLEXIBLE SIGMOIDOSCOPY;  Surgeon: Wyline Mood, MD;  Location: [facility] ENDOSCOPY;  Service: Gastroenterology;  Laterality: N/A;   FLEXIBLE SIGMOIDOSCOPY N/A 09/09/2019   Procedure: FLEXIBLE SIGMOIDOSCOPY;  Surgeon: Wyline Mood, MD;  Location: [facility] ENDOSCOPY;  Service: Gastroenterology;  Laterality: N/A;   FLEXIBLE SIGMOIDOSCOPY N/A 08/04/2020   Procedure: FLEXIBLE SIGMOIDOSCOPY;  Surgeon: Wyline Mood, MD;  Location: [facility] ENDOSCOPY;  Service: Gastroenterology;  Laterality: N/A;  Per Dr. Tobi Bastos, unsedated   FLEXIBLE SIGMOIDOSCOPY N/A 08/31/2021   Procedure: FLEXIBLE SIGMOIDOSCOPY;  Surgeon: Wyline Mood, MD;  Location: [facility] END[facility]COPY;  Service: Gastroenterology;  Laterality: N/A;  no sedation   HIP ARTHROPLASTY Right 09/27/2020   Procedure: ARTHROPLASTY BIPOLAR HIP (HEMIARTHROPLASTY);  Surgeon: Kennedy Bucker, MD;  Location: [facility];  Service: Orthopedics;  Laterality: Right;   JOINT REPLACEMENT     PULMONARY THROMBECTOMY N/A 05/08/2022   Procedure: PULMONARY THROMBECTOMY;  Surgeon: Annice Needy, MD;  Location: [facility];  Service: Cardiovascular;  Laterality: N/A;   ROTATOR CUFF REPAIR Bilateral    left-12/2009; right-03/2009 dr.califf   TONSILLECTOMY     TOTAL SHOULDER REPLACEMENT Left      FAMILY HISTORY   Family History  Problem Relation Age of Onset   COPD Mother    Cancer Brother        colon     SOCIAL HISTORY   Social History    Tobacco Use   Smoking status: Never   Smokeless tobacco: Never  Vaping Use   Vaping Use: Never used  Substance Use Topics   Alcohol use: No   Drug use: No     MEDICATIONS    Home Medication:     Current Medication:  Current Facility-Administered Medications:    0.9 %  sodium chloride infusion, 250 mL, Intravenous, PRN, Wyn Quaker, Marlow Baars, MD   acetaminophen (TYLENOL) tablet 650 mg, 650 mg, Oral, Q6H PRN, Enedina Finner, MD, 650 mg at 05/12/22 0341   acidophilus (RISAQUAD) capsule 2 capsule, 2 capsule, Oral, TID, Dew, Marlow Baars, MD, 2 capsule at 05/14/22 2203   calcium carbonate (TUMS - dosed in mg elemental calcium) chewable tablet 200 mg of elemental calcium, 1 tablet, Oral, QID PRN, Enedina Finner, MD   Chlorhexidine Gluconate Cloth 2 % PADS 6 each, 6 each, Topical, Daily, Dew, Marlow Baars, MD, 6 each at 05/14/22 0920   citalopram (CELEXA) tablet 10 mg, 10 mg, Oral, Daily, Dew, Marlow Baars, MD, 10 mg at 05/14/22 3875   cyanocobalamin (VITAMIN B12) tablet 1,000 mcg, 1,000 mcg, Oral, Becky Sax, Jearl Klinefelter, MD, 1,000 mcg at 05/13/22 0850   diclofenac Sodium (VOLTAREN) 1 % topical gel 2 g, 2 g, Topical, TID PRN, Enedina Finner, MD   feeding supplement (BOOST / RESOURCE BREEZE) liquid 1 Container, 1 Container, Oral, TID BM, Enedina Finner, MD, 1 Container at 05/14/22 1431   levalbuterol (XOPENEX) nebulizer solution 0.63 mg, 0.63 mg, Nebulization, TID, Karna Christmas, Angelize Ryce, MD, 0.63 mg at 05/15/22 0742   ondansetron (ZOFRAN) injection 4 mg, 4 mg, Intravenous, Q6H PRN, Dew, Marlow Baars, MD   pantoprazole (PROTONIX) injection 40 mg, 40 mg, Intravenous, Q12H, [place], Teodoro K, MD, 40 mg at 05/14/22 2205   predniSONE (DELTASONE) tablet 40 mg, 40 mg, Oral, Q breakfast, Almer Bushey, MD   sodium chloride flush (NS) 0.9 % injection 3 mL, 3 mL, Intravenous, Q12H, Dew, Marlow Baars, MD, 3 mL at 05/14/22 2206   sodium chloride flush (NS) 0.9 % injection 3 mL, 3 mL, Intravenous, PRN, Wyn Quaker, Marlow Baars, MD    ALLERGIES   Patient has  no known allergies.     REVIEW OF SYSTEMS    Review of Systems:  Gen:  Denies  fever, sweats, chills weigh loss  HEENT: Denies blurred vision, double vision, ear pain, eye pain, hearing loss, nose bleeds, sore throat Cardiac:  No dizziness, chest pain or heaviness, chest tightness,edema Resp:   reports dyspnea chronically  Gi:  Denies swallowing difficulty, stomach pain, nausea or vomiting, diarrhea, constipation, bowel incontinence Gu:  Denies bladder incontinence, burning urine Ext:   Denies Joint pain, stiffness or swelling Skin: Denies  skin rash, easy bruising or bleeding or hives Endoc:  Denies polyuria, polydipsia , polyphagia or weight change Psych:   Denies depression, insomnia or hallucinations   Other:  All other systems negative   VS: BP 120/61   Pulse (!) 57   Temp 98.2 F (36.8 C) (Oral)   Resp (!) 24 Comment: After to encouraged to slow down breathing  Ht 4\' 10"  (1.473 m)   Wt 49.4 kg   SpO2 (!) 89%   BMI 22.78 kg/m      PHYSICAL EXAM    GENERAL:NAD, no fevers, chills, no weakness no fatigue HEAD: Normocephalic, atraumatic.  EYES: Pupils equal, round, reactive to light. Extraocular muscles intact. No scleral icterus.  MOUTH: Moist mucosal membrane. Dentition intact. No abscess noted.  EAR, NOSE, THROAT: Clear without exudates. No external lesions.  NECK: Supple. No thyromegaly. No nodules. No JVD.  PULMONARY: decreased breath sounds with mild rhonchi worse at bases bilaterally.  CARDIOVASCULAR: S1 and S2. Regular rate and rhythm. No murmurs, rubs, or gallops. No edema. Pedal pulses 2+ bilaterally.  GASTROINTESTINAL: Soft, nontender, nondistended. No masses. Positive bowel sounds. No hepatosplenomegaly.  MUSCULOSKELETAL: No swelling, clubbing, or edema. Range of motion full in all extremities.  NEUROLOGIC: Cranial nerves II through XII are intact. No gross focal neurological deficits. Sensation intact. Reflexes intact.  SKIN: No ulceration, lesions,  rashes, or cyanosis. Skin warm and dry. Turgor intact.  PSYCHIATRIC: Mood, affect within normal limits. The patient is awake, alert and oriented x 3. Insight, judgment intact.       IMAGING         ASSESSMENT/PLAN   Acute on chronic hypoxemic respiratory failure    - abnormal CT chest with interval development of inflammatory changes as evidenced with serial CT chest above   - possible overlying interstitial edema   - RVP is negative and remainder of micro is negative   - ESR/CRP trending  -currently on prednisone 40mg   - the situation is very difficult because she benefits from steroids with ulcerative colitis and ILD but it potentiates infection including Cdiff so therapy is further complicated   - stopping diuresis due to GI losses   - colitis seems to be mostly inflammatory , appreciate ID evaluation     Pulmonary fibrosis - chronic   - on esbriet - please continue as current  -PT /OT as able             Thank you for allowing me to participate in the care of this patient.   Patient/Family are satisfied with care plan and all questions have been answered.    Provider disclosure: Patient with at least one acute or chronic illness or injury that poses a threat to life or bodily function and is being managed actively during this encounter.  All of the below services have been performed independently by signing provider:  review of prior documentation from internal and or external health records.  Review of previous and current lab results.  Interview and comprehensive assessment during patient visit today. Review of current and previous chest radiographs/CT scans. Discussion of management and test interpretation with health care team and patient/family.   This document was prepared using Dragon voice recognition software and may include unintentional dictation errors.     Vida Rigger, M.D.  Division of Pulmonary &  Critical Care Medicine

## 2022-05-15 NOTE — Progress Notes (Signed)
PROGRESS NOTE    WYNNIE PACETTI  BDZ:[id number] DOB: [birth year] DOA: 05/01/2022 PCP: [name], NP  [id number]  LOS: 14 days   Brief hospital course: No notes on file  Assessment & Plan: [name] is a 86 y.o. female with medical history significant for ILD, recurrent c diff, ulcerative colitis, chronic pain, chronic hypoxic respiratory failure, who presented with dyspnea and palpitations.   Reports a decline of several months with acute worsening the days PTA.  Reports having to increase baseline home o2 of 2-3 liters to 4 liters lately, has had several months of hypoxia, and has worsened over past few weeks to being dyspneic at rest worse with minimal ambulation.    Acute on chronic hypoxemic respiratory failure --recently, pt has been on 4-5L home O2.   --covid and RVP neg.  likely progressive ILD, pulm edema, and small PE contributing.  Questionable PNA.  Started on tx for all 4 etiologies on admission. --Continue supplemental O2 to keep sats between 88-92%, wean as tolerated --cont chest physiotherapy    ILD Underlying severe ILD that is likely progressing but at least several days of acutely worsening dyspnea.  --started on prednisone 50 mg daily on admission and escalated up to IV solumedrol. --pulm consulted, Dr. Lanney Gins Plan: --cont prednisone 40 mg daily with taper --Albuterol neb scheduled, per pulm   Bloody Diarrhea, improved H/o UC --C diff neg on presentation.  Diarrhea likely due to abx pt received during the first 2 days, and blood likely due to Eliquis --GI consult with Dr. [name]-- recommends avoid antidiarrheals, follow-up with Dr. [name].I. after hospital discharge.  --hold Eliquis   Acute blood loss anemia --Hgb dropped from 12.5 to 8.7 in 3 days from bloody diarrhea --s/p 1u pRBC for Hgb 7.8 --Monitor Hgb and transfuse to keep Hgb >8   Pulmonary embolus, acute s/p Mechanical thrombectomy and IVC filter per Dr Jefferey Pica on CT,  doubt it's the main driver for her dyspnea.   --hold Eliquis due to bloody diarrhea  AKI --Cr went up to 1.68, from baseline of 0.7 --likely 2/2 to dehydration from diarrhea --improved with IVF   Afib w RVR --s/p amiodarone gtt --currently in NSR    Acute on chronic diastolic CHF --pulm edema seen on CT, elevated BNP --started on IV lasix 40 mg daily, d/c'ed on 9/2 --hold further diuretics due to diarrhea --will give prn lasix   CAP, ruled out --ID consulted, and ruled out PNA, abx d/c'ed after 2 days.   History recurrent C diff --has multiple BM's per day, sometimes loose, but pt said that's her baseline --C diff neg on presentation   Chronic pain - home norco prn   Hypokalemia --likely due to diarrhea --monitor and replete PRN   DVT prophylaxis: SCD/Compression stockings Code Status: DNR  Family Communication:  Level of care: Progressive Dispo:   The patient is from: home Anticipated d/c is to: pt wants to return home Anticipated d/c date is: undetermined   Subjective and Interval History:  Pt desat down to 80% while on 8L just with talking.  No diarrhea.   Objective: Vitals:   05/15/22 0840 05/15/22 1139 05/15/22 1412 05/15/22 1532  BP: 120/65 133/67    Pulse: 68 70    Resp: (!) 22 20    Temp: 97.7 F (36.5 C) 98.3 F (36.8 C)  98.1 F (36.7 C)  TempSrc: Axillary   Axillary  SpO2: (!) 86% (!) 85% 93%   Weight:  Height:        Intake/Output Summary (Last 24 hours) at 05/15/2022 1838 Last data filed at 05/15/2022 1736 Gross per 24 hour  Intake --  Output 825 ml  Net -825 ml   Filed Weights   05/01/22 0753  Weight: 49.4 kg    Examination:   Constitutional: NAD, AAOx3 HEENT: conjunctivae and lids normal, EOMI CV: No cyanosis.   RESP: increased RR, on 8L Neuro: II - XII grossly intact.   Psych: Normal mood and affect.  Appropriate judgement and reason   Data Reviewed: I have personally reviewed labs and imaging studies  Time  spent: 35 minutes  Enzo Bi, MD [facility] If 7PM-7AM, please contact night-coverage 05/15/2022, 6:38 PM

## 2022-05-15 NOTE — TOC Progression Note (Signed)
Transition of Care [facility]) - Progression Note    Patient Details  Name: [name] MRN: [id number] Date of Birth: 16-Sep-1935  [facility] of [facility]) CM/SW [place], [place] Phone Number: 05/15/2022, 9:45 AM  Clinical Narrative:     CSW notes Plan is for patient to wean down as much as possible and supplemental oxygen and return home with home health and outpatient palliative services.   Patient is set up with [facility] and has 2L baseline O2 at home through [place], will need new O2 orders if home O2 L is higher than 2L at dc.   TOC continues to follow for needs.   Expected Discharge Plan: [place] Barriers to Discharge: Continued Medical Work up  Expected Discharge Plan and Services Expected Discharge Plan: [place] Choice: [place] arrangements for the past 2 months: Single Family Home                                       Social Determinants of Health (SDOH) Interventions    Readmission Risk Interventions     No data to display

## 2022-05-16 DIAGNOSIS — J9601 Acute respiratory failure with hypoxia: Secondary | ICD-10-CM | POA: Diagnosis not present

## 2022-05-16 LAB — CBC
HCT: 33.1 % — ABNORMAL LOW (ref 36.0–46.0)
Hemoglobin: 10.8 g/dL — ABNORMAL LOW (ref 12.0–15.0)
MCH: 29 pg (ref 26.0–34.0)
MCHC: 32.6 g/dL (ref 30.0–36.0)
MCV: 88.7 fL (ref 80.0–100.0)
Platelets: 300 10*3/uL (ref 150–400)
RBC: 3.73 MIL/uL — ABNORMAL LOW (ref 3.87–5.11)
RDW: 15.9 % — ABNORMAL HIGH (ref 11.5–15.5)
WBC: 16.8 10*3/uL — ABNORMAL HIGH (ref 4.0–10.5)
nRBC: 0 % (ref 0.0–0.2)

## 2022-05-16 LAB — MAGNESIUM: Magnesium: 1.7 mg/dL (ref 1.7–2.4)

## 2022-05-16 LAB — BASIC METABOLIC PANEL
Anion gap: 13 (ref 5–15)
BUN: 31 mg/dL — ABNORMAL HIGH (ref 8–23)
CO2: 33 mmol/L — ABNORMAL HIGH (ref 22–32)
Calcium: 8.7 mg/dL — ABNORMAL LOW (ref 8.9–10.3)
Chloride: 96 mmol/L — ABNORMAL LOW (ref 98–111)
Creatinine, Ser: 0.93 mg/dL (ref 0.44–1.00)
GFR, Estimated: >60 mL/min (ref >60)
Glucose, Bld: 109 mg/dL — ABNORMAL HIGH (ref 70–99)
Potassium: 2.7 mmol/L — CL (ref 3.5–5.1)
Sodium: 142 mmol/L (ref 135–145)

## 2022-05-16 MED ORDER — POTASSIUM CHLORIDE CRYS ER 20 MEQ PO TBCR
40.0000 meq | EXTENDED_RELEASE_TABLET | Freq: Once | ORAL | Status: AC
  Administered 2022-05-16: 40 meq via ORAL
  Filled 2022-05-16: qty 2

## 2022-05-16 MED ORDER — MAGNESIUM OXIDE -MG SUPPLEMENT 400 (240 MG) MG PO TABS
400.0000 mg | ORAL_TABLET | Freq: Once | ORAL | Status: AC
  Administered 2022-05-16: 400 mg via ORAL
  Filled 2022-05-16: qty 1

## 2022-05-16 MED ORDER — POTASSIUM CHLORIDE 10 MEQ/100ML IV SOLN
10.0000 meq | INTRAVENOUS | Status: AC
  Administered 2022-05-16 (×4): 10 meq via INTRAVENOUS
  Filled 2022-05-16 (×4): qty 100

## 2022-05-16 NOTE — Care Management Important Message (Signed)
Important Message  Patient Details  Name: [name] MRN: [id number] Date of Birth: 11/27/1935   Medicare Important Message Given:  Yes     Loann Quill 05/16/2022, 11:50 AM

## 2022-05-16 NOTE — Progress Notes (Signed)
PROGRESS NOTE    [name]  OXB:[id number] DOB: 01-Jun-1936 DOA: 05/01/2022 PCP: [name], NP  [id number]  LOS: 15 days   Brief hospital course: No notes on file  Assessment & Plan: [name] is a 86 y.o. female with medical history significant for ILD, recurrent c diff, ulcerative colitis, chronic pain, chronic hypoxic respiratory failure, who presented with dyspnea and palpitations.   Reports a decline of several months with acute worsening the days PTA.  Reports having to increase baseline home o2 of 2-3 liters to 4 liters lately, has had several months of hypoxia, and has worsened over past few weeks to being dyspneic at rest worse with minimal ambulation.    Acute on chronic hypoxemic respiratory failure --recently, pt has been on 4-5L home O2.   --covid and RVP neg.  likely progressive ILD, pulm edema, and small PE contributing.  Questionable PNA.  Started on tx for all 4 etiologies on admission. --Continue supplemental O2 to keep sats between 88-92%, wean as tolerated --cont chest physiotherapy    ILD Underlying severe ILD that is likely progressing but at least several days of acutely worsening dyspnea.  --started on prednisone 50 mg daily on admission and escalated up to IV solumedrol. --pulm consulted, Dr. Lanney Gins Plan: --cont prednisone 40 mg daily with taper --Albuterol neb scheduled, per pulm   Bloody Diarrhea, improved H/o UC --C diff neg on presentation.  Diarrhea likely due to abx pt received during the first 2 days, and blood likely due to Eliquis --GI consult with Dr. [name]-- recommends avoid antidiarrheals, follow-up with Dr. [name].I. after hospital discharge.  --hold Eliquis   Acute blood loss anemia --Hgb dropped from 12.5 to 8.7 in 3 days from bloody diarrhea --s/p 1u pRBC for Hgb 7.8 --Monitor Hgb and transfuse to keep Hgb >8   Pulmonary embolus, acute s/p Mechanical thrombectomy and IVC filter per Dr Jefferey Pica on CT,  doubt it's the main driver for her dyspnea.   --hold Eliquis due to bloody diarrhea  AKI --Cr went up to 1.68, from baseline of 0.7 --likely 2/2 to dehydration from diarrhea --improved with IVF   Afib w RVR --s/p amiodarone gtt --currently in NSR    Acute on chronic diastolic CHF --pulm edema seen on CT, elevated BNP --started on IV lasix 40 mg daily, d/c'ed on 9/2 --hold further diuretics due to diarrhea --will give prn lasix   CAP, ruled out --ID consulted, and ruled out PNA, abx d/c'ed after 2 days.   History recurrent C diff --has multiple BM's per day, sometimes loose, but pt said that's her baseline --C diff neg on presentation   Chronic pain - home norco prn   Hypokalemia --likely due to diarrhea --monitor and replete PRN   DVT prophylaxis: SCD/Compression stockings Code Status: DNR  Family Communication:  Level of care: Progressive Dispo:   The patient is from: home Anticipated d/c is to: pt wants to return home Anticipated d/c date is: undetermined   Subjective and Interval History:  Pt continued to require up to 10L O2, and desat with minimum exertion.  Unable to work with PT due to high RR.     Objective: Vitals:   05/16/22 0800 05/16/22 1143 05/16/22 1437 05/16/22 1615  BP: 118/75 110/73  109/72  Pulse: 83 (!) 108    Resp: (!) 34 (!) 21  (!) 22  Temp: 98.4 F (36.9 C) 97.8 F (36.6 C)  97.7 F (36.5 C)  TempSrc: Oral  Oral  Oral  SpO2: 90% 90% 90% 94%  Weight:      Height:        Intake/Output Summary (Last 24 hours) at 05/16/2022 2005 Last data filed at 05/16/2022 1616 Gross per 24 hour  Intake 85.72 ml  Output 1250 ml  Net -1164.28 ml   Filed Weights   05/01/22 0753  Weight: 49.4 kg    Examination:   Constitutional: NAD, AAOx3 HEENT: conjunctivae and lids normal, EOMI CV: No cyanosis.   RESP: increased RR, on 10L SKIN: warm, dry Neuro: II - XII grossly intact.   Psych: Normal mood and affect.  Appropriate judgement and  reason   Data Reviewed: I have personally reviewed labs and imaging studies  Time spent: 25 minutes  Enzo Bi, MD [facility] If 7PM-7AM, please contact night-coverage 05/16/2022, 8:05 PM

## 2022-05-17 DIAGNOSIS — J9601 Acute respiratory failure with hypoxia: Secondary | ICD-10-CM | POA: Diagnosis not present

## 2022-05-17 LAB — BASIC METABOLIC PANEL
Anion gap: 12 (ref 5–15)
BUN: 37 mg/dL — ABNORMAL HIGH (ref 8–23)
CO2: 33 mmol/L — ABNORMAL HIGH (ref 22–32)
Calcium: 9 mg/dL (ref 8.9–10.3)
Chloride: 96 mmol/L — ABNORMAL LOW (ref 98–111)
Creatinine, Ser: 0.89 mg/dL (ref 0.44–1.00)
GFR, Estimated: >60 mL/min (ref >60)
Glucose, Bld: 90 mg/dL (ref 70–99)
Potassium: 3.5 mmol/L (ref 3.5–5.1)
Sodium: 141 mmol/L (ref 135–145)

## 2022-05-17 LAB — CBC
HCT: 36.9 % (ref 36.0–46.0)
Hemoglobin: 11.9 g/dL — ABNORMAL LOW (ref 12.0–15.0)
MCH: 29.3 pg (ref 26.0–34.0)
MCHC: 32.2 g/dL (ref 30.0–36.0)
MCV: 90.9 fL (ref 80.0–100.0)
Platelets: 327 10*3/uL (ref 150–400)
RBC: 4.06 MIL/uL (ref 3.87–5.11)
RDW: 16.4 % — ABNORMAL HIGH (ref 11.5–15.5)
WBC: 20.5 10*3/uL — ABNORMAL HIGH (ref 4.0–10.5)
nRBC: 0 % (ref 0.0–0.2)

## 2022-05-17 LAB — MAGNESIUM: Magnesium: 1.8 mg/dL (ref 1.7–2.4)

## 2022-05-17 MED ORDER — POLYETHYLENE GLYCOL 3350 17 G PO PACK
17.0000 g | PACK | Freq: Two times a day (BID) | ORAL | Status: DC | PRN
  Administered 2022-05-17 – 2022-05-21 (×3): 17 g via ORAL
  Filled 2022-05-17 (×3): qty 1

## 2022-05-17 MED ORDER — DOCUSATE SODIUM 100 MG PO CAPS
100.0000 mg | ORAL_CAPSULE | Freq: Two times a day (BID) | ORAL | Status: DC | PRN
  Administered 2022-05-17: 100 mg via ORAL
  Filled 2022-05-17: qty 1

## 2022-05-17 MED ORDER — SENNOSIDES-DOCUSATE SODIUM 8.6-50 MG PO TABS
2.0000 | ORAL_TABLET | Freq: Once | ORAL | Status: AC
  Administered 2022-05-17: 2 via ORAL
  Filled 2022-05-17 (×2): qty 2

## 2022-05-17 NOTE — Progress Notes (Addendum)
PROGRESS NOTE    [name]  TGG:[id number] DOB: [birth year] DOA: 05/01/2022 PCP: [name], NP  [id number]  LOS: 16 days   Brief hospital course: No notes on file  Assessment & Plan: [name] is a 86 y.o. female with medical history significant for ILD, recurrent c diff, ulcerative colitis, chronic pain, chronic hypoxic respiratory failure, who presented with dyspnea and palpitations.   Reports a decline of several months with acute worsening the days PTA.  Reports having to increase baseline home o2 of 2-3 liters to 4 liters lately, has had several months of hypoxia, and has worsened over past few weeks to being dyspneic at rest worse with minimal ambulation.    Acute on chronic hypoxemic respiratory failure --recently, pt has been on 4-5L home O2.   --covid and RVP neg.  likely progressive ILD, pulm edema, and small PE contributing.  Questionable PNA.  Started on tx for all 4 etiologies on admission. --Continue supplemental O2 to keep sats between 88-92%, wean as tolerated --cont chest physiotherapy    ILD Underlying severe ILD that is likely progressing but at least several days of acutely worsening dyspnea.  --started on prednisone 50 mg daily on admission and escalated up to IV solumedrol. --pulm consulted, Dr. Lanney Gins Plan: --cont prednisone 40 mg daily with taper --Albuterol neb scheduled, per pulm   Bloody Diarrhea, improved H/o UC --C diff neg on presentation.  Diarrhea likely due to abx pt received during the first 2 days, and blood likely due to Eliquis --GI consult with Dr. [name]-- recommends avoid antidiarrheals, follow-up with Dr. [name].I. after hospital discharge.  --hold Eliquis   Acute blood loss anemia --Hgb dropped from 12.5 to 8.7 in 3 days from bloody diarrhea --s/p 1u pRBC for Hgb 7.8 --Monitor Hgb and transfuse to keep Hgb >8   Pulmonary embolus, acute s/p Mechanical thrombectomy and IVC filter per Dr Jefferey Pica on CT,  doubt it's the main driver for her dyspnea.   --hold Eliquis due to bloody diarrhea  AKI --Cr went up to 1.68, from baseline of 0.7 --likely 2/2 to dehydration from diarrhea --improved with IVF   Afib w RVR --s/p amiodarone gtt --currently in NSR    Acute on chronic diastolic CHF --pulm edema seen on CT, elevated BNP --started on IV lasix 40 mg daily, d/c'ed on 9/2 --give prn lasix   CAP, ruled out --ID consulted, and ruled out PNA, abx d/c'ed after 2 days.   History recurrent C diff --has multiple BM's per day, sometimes loose, but pt said that's her baseline --C diff neg on presentation   Chronic pain - home norco prn   Hypokalemia --likely due to diarrhea --monitor and replete PRN   DVT prophylaxis: SCD/Compression stockings Code Status: DNR  Family Communication:  Level of care: Progressive Dispo:   The patient is from: home Anticipated d/c is to: pt wants to return home Anticipated d/c date is: undetermined   Subjective and Interval History:  Pt's O2 sats seemed to be better while sleeping.    Objective: Vitals:   05/17/22 0800 05/17/22 1158 05/17/22 1205 05/17/22 1423  BP: 121/79 105/60 101/66   Pulse: 87  66   Resp: (!) 21 (!) 22 18   Temp: 97.7 F (36.5 C) 97.8 F (36.6 C) 97.7 F (36.5 C)   TempSrc: Axillary Axillary Oral   SpO2: 93% 90% 91% 92%  Weight:      Height:  Intake/Output Summary (Last 24 hours) at 05/17/2022 1806 Last data filed at 05/17/2022 1402 Gross per 24 hour  Intake 660 ml  Output 850 ml  Net -190 ml   Filed Weights   05/01/22 0753  Weight: 49.4 kg    Examination:   Constitutional: NAD, sleeping CV: No cyanosis.   RESP: normal respiratory effort, on 6L sating 92% Extremities: No effusions, edema in BLE   Data Reviewed: I have personally reviewed labs and imaging studies  Time spent: 25 minutes  Enzo Bi, MD [facility] If 7PM-7AM, please contact night-coverage 05/17/2022, 6:06 PM

## 2022-05-17 NOTE — TOC Progression Note (Signed)
Transition of Care [facility]) - Progression Note    Patient Details  Name: JAE BRUCK MRN: [id number] Date of Birth: May 28, 1936  Transition of [facility]) CM/SW [place], [place] Phone Number: 05/17/2022, 11:41 AM  Clinical Narrative:     Patient/family agreeable for [facility] Kathie Dike, Raquel Sarna with [facility] has started insurance auth today 9/15.  Expected Discharge Plan: [place] Barriers to Discharge: Continued Medical Work up  Expected Discharge Plan and Services Expected Discharge Plan: [place] Choice: [place] arrangements for the past 2 months: Single Family Home                                       Social Determinants of Health (SDOH) Interventions    Readmission Risk Interventions     No data to display

## 2022-05-17 NOTE — Progress Notes (Signed)
Physical Therapy Treatment Patient Details Name: [name] MRN: [id number] DOB: 06-Sep-1935 Today's Date: 05/17/2022   History of Present Illness Admitted due to dyspnea and palpitations. Reports a decline of several months with acute worsening the past few days. Reports palpitations with minimal movement present for several weeks. No chest pain. Reports having to increase baseline home o2 of 2-3 liters to 4 liters lately, has had several months of hypoxia, and has worsened over past few weeks to being dyspneic at rest worse with minimal ambulation. Slight increased cough, not productive. Imaging revals PE. Per Dr. Billie Ruddy it is small and she is ok to participate with PT.    PT Comments    Pt was supine in bed on 10 L O2. Requesting to get onto bedpan. Pt has was able to roll without physical assistance however even with just rolling has desaturation with elevation in HR/RR. Mds made aware. Will continue our efforts to progress pt however due to limited cardio  respiratory tolerance, she has remained very limited. Per chart, plan for Dc to [facility] when bed available.    Recommendations for follow up therapy are one component of a multi-disciplinary discharge planning process, led by the attending physician.  Recommendations may be updated based on patient status, additional functional criteria and insurance authorization.  Follow Up Recommendations  Follow physician's recommendations for discharge plan and follow up therapies     Assistance Recommended at Discharge Frequent or constant Supervision/Assistance  Patient can return home with the following A little help with walking and/or transfers;A little help with bathing/dressing/bathroom;Assist for transportation;Assistance with cooking/housework   Equipment Recommendations  None recommended by PT    Recommendations for Other Services       Precautions / Restrictions Precautions Precautions: Fall (watch RR,HR,sao2  !!!Careful!!!) Precaution Comments: SaO2/RR/ HR Restrictions Weight Bearing Restrictions: No     Mobility  Bed Mobility Overal bed mobility: Needs Assistance Bed Mobility: Rolling  General bed mobility comments: pt is easily able to roll R to be placed on bed pan. HR elevated to 150bpm, sao2 dropped to 72% on 10 L with RR elevated into upper 40s.    Transfers    General transfer comment: unable to even tolerate bed mobility. MD x 2 made aware of concerns           Cognition Arousal/Alertness: Awake/alert Behavior During Therapy: WFL for tasks assessed/performed Overall Cognitive Status: Within Functional Limits for tasks assessed    General Comments: pt remains A and O but severely limited due to cardio-respiratory respones to minimal activity.          PT Goals (current goals can now be found in the care plan section) Acute Rehab PT Goals Patient Stated Goal: get better    Frequency    Min 2X/week      PT Plan Current plan remains appropriate       AM-PAC PT "6 Clicks" Mobility   Outcome Measure  Help needed turning from your back to your side while in a flat bed without using bedrails?: None Help needed moving from lying on your back to sitting on the side of a flat bed without using bedrails?: A Little Help needed moving to and from a bed to a chair (including a wheelchair)?: A Lot Help needed standing up from a chair using your arms (e.g., wheelchair or bedside chair)?: A Lot Help needed to walk in hospital room?: A Lot Help needed climbing 3-5 steps with a railing? : Total 6 Click Score: 14  End of Session Equipment Utilized During Treatment: Oxygen (10 L O2 with prolonged recovery time) Activity Tolerance: Treatment limited secondary to medical complications (Comment) (limited by cardio-pulmonary response to very miminal activity) Patient left: in bed;with call bell/phone within reach;with bed alarm set;with family/visitor present Nurse  Communication: Mobility status PT Visit Diagnosis: Other abnormalities of gait and mobility (R26.89);Muscle weakness (generalized) (M62.81)     Time:  -     Charges:                        Julaine Fusi PTA 05/17/22, 1:11 PM

## 2022-05-17 NOTE — Progress Notes (Signed)
PULMONOLOGY         Date: 05/17/2022,   MRN# [id number] [name] [birth year]     AdmissionWeight: 49.4 kg                 CurrentWeight: 49.4 kg  Referring provider: [name]   CHIEF COMPLAINT:   Acute on chronic hypoxemic respiratory failure   HISTORY OF PRESENT ILLNESS   This is a pleasant 86 yo F with hx of UC, ILD with Pulmonary fibrosis, chronic pain syndrome, who came in due to acute on chronic hypoxemia. She reports increased O2 requirement at home with worsening cough, she denies flu like illness or fevers.  Labwork reveals elevated BNP   05/02/22 - patient is improved clinically, RVP is negative , does not appear to have ongoing infection s/p ID eval with de-escalation of antimicrobials. Placed on eliquis for acute PE with mild clot burden.  CRP and ESR was not collected and I have reordered this. She is nontachypneic during exam speaking in full sentences. Mild hypokalemia on labs has been repleted and repleted magnesium as well.    05/04/22- Patient is a bit uncomfortable this am. She reports worsening diarreah and abd discomfort.  She had 5 loose stools overnight. She has not been able to wean down much from yesterday on O2. We repleted her K but due to GI losses she has hypokalemia still.  Will obtain pharmacy consult to help with ongong electrolyte shift.  Reduced steroids to 60IV once daily, have added TID scheduled tussinex 2.56mL to help with diarreah since we think its non-infectious.  CXR done and reviewed this AM , there are diffuse bilateral infiltrates but improved from rior.   05/07/22- patient had several bloody episodes of loose stools. She thinks today this is slightly better. Her inflammatory biomarkers are still elevated. Her overall presentation seems like a UC patient with COVID19 infection, I wonder if she has an undetectable strain with current testing platform.  O2 requirement is unchanged from 2 days ago.  Im considering vascular surgery  consult for thromectomy evaluation since we have not had much more improvemetn over past 48h and unable to use blood thinners any longer due to onging GI bleeds with UC. Fungitell is still inprocess , remainder of infectiuos workup is negative. I have reduced steroids today. To 40mg  solumedrol  05/08/22-patient is improved on 10L/min [place]. She has thrombectomy planned for today. Will keep solumedrol at 40mg  IV daily. She reports feeling better and is smiling during my evaluation.  Husband at bedside we reivewed medical plan.    05/09/22- patient is on HFNC post thrombectomy, she had a setback post operatively may be due to physical stress.  She does have atelectasis on cxr and we have initiated metaneb q8h with saline. Additional covid testing in process due to clinical suspicion of false negative.   05/10/22- patient is improved slightly and has been weaned from HFNC from 75% to 65%.  We changed code status to DNR today.   05/11/22- patient is slightly improved with less O2 requirement and weaning in process.  Have reduced steroids to PO prednisone 50mg . Plan to continue current plan and start PT.  Plan Reviewed with attending physician.   05/13/22- patient feels slightly improved, plan today to get OOB more, her loose stools have essentially resolved.  No new events.  Her spO2 goal is anything >80%.  CXR repeat today.  Reduced PO prednisone  05/14/22- patient is weaned to 6L/min and is working  with PT to get out of bed, she took few steps from bed to chair today.    05/15/22 - patient is on 6L/m [place] she had mild resp distress with ambulation. She is in no distress.   05/16/22- patient is on 10L/min she did diurese better overnight but not much improved on her O2 therapy.  She is not ready for hospice, she wants to keep fighting for now.  [facility] with [facility] regarding LTACH palcement.  Patient profile will be reviewed for this. Will dc diuretics today.   05/17/22- patient is slightly improved at 8L/min, she  had BM today. I spoke with husband and we are still planning on [facility] for long term rehab. Plan to reduce prednisone by 5mg  per week for slow taper please.    PAST MEDICAL HISTORY   Past Medical History:  Diagnosis Date   Altered mental state    Anginal pain (HCC)    Anxiety    Basal cell carcinoma 05/03/2021   right neck postauricular - Excised 06/12/21   BMI 30.0-30.9,adult    Cellulitis    Chest pain, atypical    Compression fracture of L4 lumbar vertebra    Constipation    COVID-19 virus infection 05/28/2020   Cystitis    Cystocele    Diverticulosis    Fatigue    Fibrosis, idiopathic pulmonary (HCC)    Gastroenteritis    History of back surgery    History of herniated intervertebral disc    HTN (hypertension)    Hypercholesteremia    Hypocalcemia    Incontinence of urine    OA (osteoarthritis)    shoulder and back   Obesity    Osteopenia    Pneumonia    Shortness of breath    Sleep apnea    Squamous cell carcinoma of skin 07/16/2016   Right upper lateral eyebrow. SCCis.   Squamous cell carcinoma of skin 01/13/2018   Left temporal hairline. WD SCC with superficial infiltration.   Thrush    Vitamin D deficiency      SURGICAL HISTORY   Past Surgical History:  Procedure Laterality Date   BACK SURGERY     CATARACT EXTRACTION W/PHACO Left 04/24/2016   Procedure: CATARACT EXTRACTION PHACO AND INTRAOCULAR LENS PLACEMENT (IOC);  Surgeon: Lockie Mola, MD;  Location: [facility]ERY [facility];  Service: Ophthalmology;  Laterality: Left;   CATARACT EXTRACTION W/PHACO Right 07/14/2017   Procedure: CATARACT EXTRACTION PHACO AND INTRAOCULAR LENS PLACEMENT (IOC)  RIGHT;  Surgeon: Lockie Mola, MD;  Location: [facility] SURGERY [facility];  Service: Ophthalmology;  Laterality: Right;   CHOLECYSTECTOMY     COLONOSCOPY WITH PROPOFOL N/A 02/01/2022   Procedure: COLONOSCOPY WITH PROPOFOL;  Surgeon: Wyline Mood, MD;  Location: [facility] ENDOSCOPY;  Service: Gastroenterology;   Laterality: N/A;   CYSTOSCOPY/URETEROSCOPY/HOLMIUM LASER/STENT PLACEMENT Left 04/27/2021   Procedure: CYSTOSCOPY/URETEROSCOPY/HOLMIUM LASER/STENT PLACEMENT;  Surgeon: Sondra Come, MD;  Location: [facility] ORS;  Service: Urology;  Laterality: Left;   ESOPHAGOGASTRODUODENOSCOPY (EGD) WITH PROPOFOL N/A 10/13/2018   Procedure: ESOPHAGOGASTRODUODENOSCOPY (EGD) WITH PROPOFOL;  Surgeon: Wyline Mood, MD;  Location: [facility] ENDOSCOPY;  Service: Gastroenterology;  Laterality: N/A;   ESOPHAGOGASTRODUODENOSCOPY (EGD) WITH PROPOFOL N/A 11/22/2021   Procedure: ESOPHAGOGASTRODUODENOSCOPY (EGD) WITH PROPOFOL;  Surgeon: Wyline Mood, MD;  Location: [facility] ENDOSCOPY;  Service: Gastroenterology;  Laterality: N/A;   EYE SURGERY     FLEXIBLE SIGMOIDOSCOPY N/A 10/13/2018   Procedure: FLEXIBLE SIGMOIDOSCOPY;  Surgeon: Wyline Mood, MD;  Location: [facility] ENDOSCOPY;  Service: Gastroenterology;  Laterality: N/A;   FLEXIBLE SIGMOIDOSCOPY N/A 09/09/2019  Procedure: FLEXIBLE SIGMOIDOSCOPY;  Surgeon: Wyline Mood, MD;  Location: [facility] ENDOSCOPY;  Service: Gastroenterology;  Laterality: N/A;   FLEXIBLE SIGMOIDOSCOPY N/A 08/04/2020   Procedure: FLEXIBLE SIGMOIDOSCOPY;  Surgeon: Wyline Mood, MD;  Location: [facility] ENDOSCOPY;  Service: Gastroenterology;  Laterality: N/A;  Per Dr. Tobi Bastos, unsedated   FLEXIBLE SIGMOIDOSCOPY N/A 08/31/2021   Procedure: FLEXIBLE SIGMOIDOSCOPY;  Surgeon: Wyline Mood, MD;  Location: [facility] ENDOSCOPY;  Service: Gastroenterology;  Laterality: N/A;  no sedation   HIP ARTHROPLASTY Right 09/27/2020   Procedure: ARTHROPLASTY BIPOLAR HIP (HEMIARTHROPLASTY);  Surgeon: Kennedy Bucker, MD;  Location: [facility] ORS;  Service: Orthopedics;  Laterality: Right;   JOINT REPLACEMENT     PULMONARY THROMBECTOMY N/A 05/08/2022   Procedure: PULMONARY THROMBECTOMY;  Surgeon: Annice Needy, MD;  Location: [facility] INVASIVE CV LAB;  Service: Cardiovascular;  Laterality: N/A;   ROTATOR CUFF REPAIR Bilateral    left-12/2009; right-03/2009 dr.califf    TONSILLECTOMY     TOTAL SHOULDER REPLACEMENT Left      FAMILY HISTORY   Family History  Problem Relation Age of Onset   COPD Mother    Cancer Brother        colon     SOCIAL HISTORY   Social History   Tobacco Use   Smoking status: Never   Smokeless tobacco: Never  Vaping Use   Vaping Use: Never used  Substance Use Topics   Alcohol use: No   Drug use: No     MEDICATIONS    Home Medication:     Current Medication:  Current Facility-Administered Medications:    0.9 %  sodium chloride infusion, 250 mL, Intravenous, PRN, Wyn Quaker, Marlow Baars, MD   acetaminophen (TYLENOL) tablet 650 mg, 650 mg, Oral, Q6H PRN, Enedina Finner, MD, 650 mg at 05/15/22 0837   acidophilus (RISAQUAD) capsule 2 capsule, 2 capsule, Oral, TID, Dew, Marlow Baars, MD, 2 capsule at 05/16/22 2119   calcium carbonate (TUMS - dosed in mg elemental calcium) chewable tablet 200 mg of elemental calcium, 1 tablet, Oral, QID PRN, Enedina Finner, MD   Chlorhexidine Gluconate Cloth 2 % PADS 6 each, 6 each, Topical, Daily, Dew, Marlow Baars, MD, 6 each at 05/14/22 0920   citalopram (CELEXA) tablet 10 mg, 10 mg, Oral, Daily, Dew, Marlow Baars, MD, 10 mg at 05/16/22 1610   cyanocobalamin (VITAMIN B12) tablet 1,000 mcg, 1,000 mcg, Oral, Becky Sax, Jearl Klinefelter, MD, 1,000 mcg at 05/15/22 9604   diclofenac Sodium (VOLTAREN) 1 % topical gel 2 g, 2 g, Topical, TID PRN, Enedina Finner, MD   feeding supplement (BOOST / RESOURCE BREEZE) liquid 1 Container, 1 Container, Oral, TID BM, Enedina Finner, MD, 1 Container at 05/16/22 1344   levalbuterol (XOPENEX) nebulizer solution 0.63 mg, 0.63 mg, Nebulization, TID, Karna Christmas, Rahil Passey, MD, 0.63 mg at 05/16/22 2006   ondansetron (ZOFRAN) injection 4 mg, 4 mg, Intravenous, Q6H PRN, Wyn Quaker, Marlow Baars, MD   predniSONE (DELTASONE) tablet 40 mg, 40 mg, Oral, Q breakfast, Atalya Dano, MD, 40 mg at 05/16/22 0907   sodium chloride flush (NS) 0.9 % injection 3 mL, 3 mL, Intravenous, Q12H, Dew, Marlow Baars, MD, 3 mL at 05/16/22 2119    sodium chloride flush (NS) 0.9 % injection 3 mL, 3 mL, Intravenous, PRN, Wyn Quaker, Marlow Baars, MD    ALLERGIES   Patient has no known allergies.     REVIEW OF SYSTEMS    Review of Systems:  Gen:  Denies  fever, sweats, chills weigh loss  HEENT: Denies blurred vision, double vision, ear pain, eye pain,  hearing loss, nose bleeds, sore throat Cardiac:  No dizziness, chest pain or heaviness, chest tightness,edema Resp:   reports dyspnea chronically  Gi: Denies swallowing difficulty, stomach pain, nausea or vomiting, diarrhea, constipation, bowel incontinence Gu:  Denies bladder incontinence, burning urine Ext:   Denies Joint pain, stiffness or swelling Skin: Denies  skin rash, easy bruising or bleeding or hives Endoc:  Denies polyuria, polydipsia , polyphagia or weight change Psych:   Denies depression, insomnia or hallucinations   Other:  All other systems negative   VS: BP 122/84 (BP Location: Left Arm)   Pulse 79   Temp (!) 97.3 F (36.3 C) (Axillary)   Resp (!) 25   Ht 4\' 10"  (1.473 m)   Wt 49.4 kg   SpO2 96%   BMI 22.78 kg/m      PHYSICAL EXAM    GENERAL:NAD, no fevers, chills, no weakness no fatigue HEAD: Normocephalic, atraumatic.  EYES: Pupils equal, round, reactive to light. Extraocular muscles intact. No scleral icterus.  MOUTH: Moist mucosal membrane. Dentition intact. No abscess noted.  EAR, NOSE, THROAT: Clear without exudates. No external lesions.  NECK: Supple. No thyromegaly. No nodules. No JVD.  PULMONARY: decreased breath sounds with mild rhonchi worse at bases bilaterally.  CARDIOVASCULAR: S1 and S2. Regular rate and rhythm. No murmurs, rubs, or gallops. No edema. Pedal pulses 2+ bilaterally.  GASTROINTESTINAL: Soft, nontender, nondistended. No masses. Positive bowel sounds. No hepatosplenomegaly.  MUSCULOSKELETAL: No swelling, clubbing, or edema. Range of motion full in all extremities.  NEUROLOGIC: Cranial nerves II through XII are intact. No gross  focal neurological deficits. Sensation intact. Reflexes intact.  SKIN: No ulceration, lesions, rashes, or cyanosis. Skin warm and dry. Turgor intact.  PSYCHIATRIC: Mood, affect within normal limits. The patient is awake, alert and oriented x 3. Insight, judgment intact.       IMAGING         ASSESSMENT/PLAN   Acute on chronic hypoxemic respiratory failure    - abnormal CT chest with interval development of inflammatory changes as evidenced with serial CT chest above   - possible overlying interstitial edema   - RVP is negative and remainder of micro is negative   - ESR/CRP trending  -currently on prednisone 40mg   - the situation is very difficult because she benefits from steroids with ulcerative colitis and ILD but it potentiates infection including Cdiff so therapy is further complicated   - stopping diuresis due to GI losses   - colitis seems to be mostly inflammatory , appreciate ID evaluation     Pulmonary fibrosis - chronic   - on esbriet - please continue as current  -PT /OT as able             Thank you for allowing me to participate in the care of this patient.   Patient/Family are satisfied with care plan and all questions have been answered.    Provider disclosure: Patient with at least one acute or chronic illness or injury that poses a threat to life or bodily function and is being managed actively during this encounter.  All of the below services have been performed independently by signing provider:  review of prior documentation from internal and or external health records.  Review of previous and current lab results.  Interview and comprehensive assessment during patient visit today. Review of current and previous chest radiographs/CT scans. Discussion of management and test interpretation with health care team and patient/family.   This document was prepared using Dragon voice  recognition software and may include unintentional dictation errors.      Vida Rigger, M.D.  Division of Pulmonary & Critical Care Medicine

## 2022-05-18 DIAGNOSIS — J9601 Acute respiratory failure with hypoxia: Secondary | ICD-10-CM | POA: Diagnosis not present

## 2022-05-18 LAB — BASIC METABOLIC PANEL
Anion gap: 10 (ref 5–15)
BUN: 42 mg/dL — ABNORMAL HIGH (ref 8–23)
CO2: 33 mmol/L — ABNORMAL HIGH (ref 22–32)
Calcium: 9 mg/dL (ref 8.9–10.3)
Chloride: 97 mmol/L — ABNORMAL LOW (ref 98–111)
Creatinine, Ser: 0.85 mg/dL (ref 0.44–1.00)
GFR, Estimated: >60 mL/min (ref >60)
Glucose, Bld: 82 mg/dL (ref 70–99)
Potassium: 3.2 mmol/L — ABNORMAL LOW (ref 3.5–5.1)
Sodium: 140 mmol/L (ref 135–145)

## 2022-05-18 LAB — CBC
HCT: 35 % — ABNORMAL LOW (ref 36.0–46.0)
Hemoglobin: 11.2 g/dL — ABNORMAL LOW (ref 12.0–15.0)
MCH: 29.1 pg (ref 26.0–34.0)
MCHC: 32 g/dL (ref 30.0–36.0)
MCV: 90.9 fL (ref 80.0–100.0)
Platelets: 303 10*3/uL (ref 150–400)
RBC: 3.85 MIL/uL — ABNORMAL LOW (ref 3.87–5.11)
RDW: 16.5 % — ABNORMAL HIGH (ref 11.5–15.5)
WBC: 21.7 10*3/uL — ABNORMAL HIGH (ref 4.0–10.5)
nRBC: 0 % (ref 0.0–0.2)

## 2022-05-18 LAB — PROCALCITONIN: Procalcitonin: <0.1 ng/mL

## 2022-05-18 LAB — MAGNESIUM: Magnesium: 1.9 mg/dL (ref 1.7–2.4)

## 2022-05-18 MED ORDER — AZITHROMYCIN 250 MG PO TABS
500.0000 mg | ORAL_TABLET | Freq: Every day | ORAL | Status: AC
  Administered 2022-05-18 – 2022-05-22 (×5): 500 mg via ORAL
  Filled 2022-05-18 (×5): qty 2

## 2022-05-18 MED ORDER — MAGNESIUM SULFATE 2 GM/50ML IV SOLN
2.0000 g | Freq: Once | INTRAVENOUS | Status: AC
  Administered 2022-05-18: 2 g via INTRAVENOUS
  Filled 2022-05-18: qty 50

## 2022-05-18 MED ORDER — PREDNISONE 50 MG PO TABS
35.0000 mg | ORAL_TABLET | Freq: Every day | ORAL | Status: DC
  Administered 2022-05-19: 35 mg via ORAL
  Filled 2022-05-18: qty 1

## 2022-05-18 MED ORDER — MAGNESIUM CITRATE PO SOLN
1.0000 | Freq: Once | ORAL | Status: AC
  Administered 2022-05-18: 1 via ORAL
  Filled 2022-05-18: qty 296

## 2022-05-18 MED ORDER — POLYETHYLENE GLYCOL 3350 17 G PO PACK
34.0000 g | PACK | ORAL | Status: AC
  Administered 2022-05-18 (×4): 34 g via ORAL
  Filled 2022-05-18 (×4): qty 2

## 2022-05-18 MED ORDER — POTASSIUM CHLORIDE CRYS ER 20 MEQ PO TBCR
40.0000 meq | EXTENDED_RELEASE_TABLET | Freq: Once | ORAL | Status: AC
  Administered 2022-05-18: 40 meq via ORAL
  Filled 2022-05-18: qty 2

## 2022-05-18 MED ORDER — POTASSIUM CHLORIDE 10 MEQ/100ML IV SOLN
10.0000 meq | INTRAVENOUS | Status: DC
  Administered 2022-05-18: 10 meq via INTRAVENOUS
  Filled 2022-05-18 (×3): qty 100

## 2022-05-18 NOTE — Progress Notes (Signed)
PROGRESS NOTE    [name]  HMC:[id number] DOB: June 04, 1936 DOA: 05/01/2022 PCP: [name], NP  [id number]  LOS: 17 days   Brief hospital course: No notes on file  Assessment & Plan: [name] is a 86 y.o. female with medical history significant for ILD, recurrent c diff, ulcerative colitis, chronic pain, chronic hypoxic respiratory failure, who presented with dyspnea and palpitations.   Reports a decline of several months with acute worsening the days PTA.  Reports having to increase baseline home o2 of 2-3 liters to 4 liters lately, has had several months of hypoxia, and has worsened over past few weeks to being dyspneic at rest worse with minimal ambulation.    Acute on chronic hypoxemic respiratory failure --recently, pt has been on 4-5L home O2.   --covid and RVP neg.  likely progressive ILD, pulm edema, and small PE contributing.  Questionable PNA.  Started on tx for all 4 etiologies on admission. --Continue supplemental O2 to keep sats between 88-92%, wean as tolerated --cont chest physiotherapy    ILD Underlying severe ILD that is likely progressing but at least several days of acutely worsening dyspnea.  --started on prednisone 50 mg daily on admission and escalated up to IV solumedrol. --pulm consulted, Dr. Lanney Gins Plan: --cont prednisone 35 mg daily with taper --Albuterol neb scheduled, per pulm   Bloody Diarrhea, improved H/o UC --C diff neg on presentation.  Diarrhea likely due to abx pt received during the first 2 days, and blood likely due to Eliquis --GI consult with Dr. [name]-- recommends avoid antidiarrheals, follow-up with Dr. [name].I. after hospital discharge.  --hold Eliquis   Acute blood loss anemia --Hgb dropped from 12.5 to 8.7 in 3 days from bloody diarrhea --s/p 1u pRBC for Hgb 7.8 --Monitor Hgb and transfuse to keep Hgb >8   Pulmonary embolus, acute s/p Mechanical thrombectomy and IVC filter per Dr Jefferey Pica on CT,  doubt it's the main driver for her dyspnea.   --hold Eliquis due to bloody diarrhea  AKI --Cr went up to 1.68, from baseline of 0.7 --likely 2/2 to dehydration from diarrhea --improved with IVF   Afib w RVR --s/p amiodarone gtt --currently in NSR    Acute on chronic diastolic CHF --pulm edema seen on CT, elevated BNP --started on IV lasix 40 mg daily, d/c'ed on 9/2 --give prn lasix   CAP, ruled out --ID consulted, and ruled out PNA, abx d/c'ed after 2 days.   History recurrent C diff --has multiple BM's per day, sometimes loose, but pt said that's her baseline --C diff neg on presentation   Chronic pain - home norco prn   Hypokalemia --likely due to diarrhea --monitor and replete PRN   DVT prophylaxis: SCD/Compression stockings Code Status: DNR  Family Communication: husband updated at bedside today Level of care: Progressive Dispo:   The patient is from: home Anticipated d/c is to: pt wants to return home Anticipated d/c date is: undetermined   Subjective and Interval History:  Pt complained of abdominal cramp and feeling constipated.     Objective: Vitals:   05/18/22 1400 05/18/22 1500 05/18/22 1600 05/18/22 1700  BP: 108/61  113/69   Pulse: 100 92 93 98  Resp: (!) 34 (!) 31 (!) 41 (!) 37  Temp:    98.4 F (36.9 C)  TempSrc:      SpO2: 90% 94% 94% (!) 79%  Weight:      Height:  Intake/Output Summary (Last 24 hours) at 05/18/2022 1822 Last data filed at 05/17/2022 1859 Gross per 24 hour  Intake 180 ml  Output 0 ml  Net 180 ml   Filed Weights   05/01/22 0753  Weight: 49.4 kg    Examination:   Constitutional: NAD, AAOx3 HEENT: conjunctivae and lids normal, EOMI CV: No cyanosis.   RESP: normal respiratory effort, on 6L Neuro: II - XII grossly intact.   Psych: Normal mood and affect.  Appropriate judgement and reason   Data Reviewed: I have personally reviewed labs and imaging studies  Time spent: 25 minutes  Enzo Bi, MD [facility] If 7PM-7AM, please contact night-coverage 05/18/2022, 6:22 PM

## 2022-05-18 NOTE — Progress Notes (Signed)
PULMONOLOGY         Date: 05/18/2022,   MRN# [id number] [name] 10/03/1935     AdmissionWeight: 49.4 kg                 CurrentWeight: 49.4 kg  Referring provider: [name]   CHIEF COMPLAINT:   Acute on chronic hypoxemic respiratory failure   HISTORY OF PRESENT ILLNESS   This is a pleasant 86 yo F with hx of UC, ILD with Pulmonary fibrosis, chronic pain syndrome, who came in due to acute on chronic hypoxemia. She reports increased O2 requirement at home with worsening cough, she denies flu like illness or fevers.  Labwork reveals elevated BNP   05/02/22 - patient is improved clinically, RVP is negative , does not appear to have ongoing infection s/p ID eval with de-escalation of antimicrobials. Placed on eliquis for acute PE with mild clot burden.  CRP and ESR was not collected and I have reordered this. She is nontachypneic during exam speaking in full sentences. Mild hypokalemia on labs has been repleted and repleted magnesium as well.    05/04/22- Patient is a bit uncomfortable this am. She reports worsening diarreah and abd discomfort.  She had 5 loose stools overnight. She has not been able to wean down much from yesterday on O2. We repleted her K but due to GI losses she has hypokalemia still.  Will obtain pharmacy consult to help with ongong electrolyte shift.  Reduced steroids to 60IV once daily, have added TID scheduled tussinex 2.24mL to help with diarreah since we think its non-infectious.  CXR done and reviewed this AM , there are diffuse bilateral infiltrates but improved from rior.   05/07/22- patient had several bloody episodes of loose stools. She thinks today this is slightly better. Her inflammatory biomarkers are still elevated. Her overall presentation seems like a UC patient with COVID19 infection, I wonder if she has an undetectable strain with current testing platform.  O2 requirement is unchanged from 2 days ago.  Im considering vascular surgery  consult for thromectomy evaluation since we have not had much more improvemetn over past 48h and unable to use blood thinners any longer due to onging GI bleeds with UC. Fungitell is still inprocess , remainder of infectiuos workup is negative. I have reduced steroids today. To 40mg  solumedrol  05/08/22-patient is improved on 10L/min [place]. She has thrombectomy planned for today. Will keep solumedrol at 40mg  IV daily. She reports feeling better and is smiling during my evaluation.  Husband at bedside we reivewed medical plan.    05/09/22- patient is on HFNC post thrombectomy, she had a setback post operatively may be due to physical stress.  She does have atelectasis on cxr and we have initiated metaneb q8h with saline. Additional covid testing in process due to clinical suspicion of false negative.   05/10/22- patient is improved slightly and has been weaned from HFNC from 75% to 65%.  We changed code status to DNR today.   05/11/22- patient is slightly improved with less O2 requirement and weaning in process.  Have reduced steroids to PO prednisone 50mg . Plan to continue current plan and start PT.  Plan Reviewed with attending physician.   05/13/22- patient feels slightly improved, plan today to get OOB more, her loose stools have essentially resolved.  No new events.  Her spO2 goal is anything >80%.  CXR repeat today.  Reduced PO prednisone  05/14/22- patient is weaned to 6L/min and is working  with PT to get out of bed, she took few steps from bed to chair today.    05/15/22 - patient is on 6L/m [place] she had mild resp distress with ambulation. She is in no distress.   05/16/22- patient is on 10L/min she did diurese better overnight but not much improved on her O2 therapy.  She is not ready for hospice, she wants to keep fighting for now.  I spoke with [facility] regarding LTACH palcement.  Patient profile will be reviewed for this. Will dc diuretics today.   05/17/22- patient is slightly improved at 8L/min, she  had BM today. I spoke with husband and we are still planning on [facility] for long term rehab. Plan to reduce prednisone by 5mg  per week for slow taper please.   05/18/22 - patient is weaned to 5L/min with episodic desaturation but overall pretty good and stable. She did incentive spirometery with me and current tidal volume is up to 300cc.  She is constipated, I think this is due to hypovolemic status overall post diuresis.  She has miralax and colace.  I have allowed patient to have a large Lemonade today to rehydrate and enjoy something PO. I have reordered zithromax since patient is on long term steroids for secondary prevention of pneumonia.    PAST MEDICAL HISTORY   Past Medical History:  Diagnosis Date   Altered mental state    Anginal pain (HCC)    Anxiety    Basal cell carcinoma 05/03/2021   right neck postauricular - Excised 06/12/21   BMI 30.0-30.9,adult    Cellulitis    Chest pain, atypical    Compression fracture of L4 lumbar vertebra    Constipation    COVID-19 virus infection 05/28/2020   Cystitis    Cystocele    Diverticulosis    Fatigue    Fibrosis, idiopathic pulmonary (HCC)    Gastroenteritis    History of back surgery    History of herniated intervertebral disc    HTN (hypertension)    Hypercholesteremia    Hypocalcemia    Incontinence of urine    OA (osteoarthritis)    shoulder and back   Obesity    Osteopenia    Pneumonia    Shortness of breath    Sleep apnea    Squamous cell carcinoma of skin 07/16/2016   Right upper lateral eyebrow. SCCis.   Squamous cell carcinoma of skin 01/13/2018   Left temporal hairline. WD SCC with superficial infiltration.   Thrush    Vitamin D deficiency      SURGICAL HISTORY   Past Surgical History:  Procedure Laterality Date   BACK SURGERY     CATARACT EXTRACTION W/PHACO Left 04/24/2016   Procedure: CATARACT EXTRACTION PHACO AND INTRAOCULAR LENS PLACEMENT (IOC);  Surgeon: Lockie Mola, MD;  Location: [facility]  SURGERY [facility];  Service: Ophthalmology;  Laterality: Left;   CATARACT EXTRACTION W/PHACO Right 07/14/2017   Procedure: CATARACT EXTRACTION PHACO AND INTRAOCULAR LENS PLACEMENT (IOC)  RIGHT;  Surgeon: Lockie Mola, MD;  Location: [facility];  Service: Ophthalmology;  Laterality: Right;   CHOLECYSTECTOMY     COLONOSCOPY WITH PROPOFOL N/A 02/01/2022   Procedure: COLONOSCOPY WITH PROPOFOL;  Surgeon: Wyline Mood, MD;  Location: [facility] ENDOSCOPY;  Service: Gastroenterology;  Laterality: N/A;   CYSTOSCOPY/URETEROSCOPY/HOLMIUM LASER/STENT PLACEMENT Left 04/27/2021   Procedure: CYSTOSCOPY/URETEROSCOPY/HOLMIUM LASER/STENT PLACEMENT;  Surgeon: Sondra Come, MD;  Location: [facility] ORS;  Service: Urology;  Laterality: Left;   ESOPHAGOGASTRODUODENOSCOPY (EGD) WITH PROPOFOL N/A 10/13/2018   Procedure: ESOPHAGOGASTRODUODENOSCOPY (  EGD) WITH PROPOFOL;  Surgeon: Wyline Mood, MD;  Location: [facility] ENDOSCOPY;  Service: Gastroenterology;  Laterality: N/A;   ESOPHAGOGASTRODUODENOSCOPY (EGD) WITH PROPOFOL N/A 11/22/2021   Procedure: ESOPHAGOGASTRODUODENOSCOPY (EGD) WITH PROPOFOL;  Surgeon: Wyline Mood, MD;  Location: [facility] ENDOSCOPY;  Service: Gastroenterology;  Laterality: N/A;   EYE SURGERY     FLEXIBLE SIGMOIDOSCOPY N/A 10/13/2018   Procedure: FLEXIBLE SIGMOIDOSCOPY;  Surgeon: Wyline Mood, MD;  Location: [facility] ENDOSCOPY;  Service: Gastroenterology;  Laterality: N/A;   FLEXIBLE SIGMOIDOSCOPY N/A 09/09/2019   Procedure: FLEXIBLE SIGMOIDOSCOPY;  Surgeon: Wyline Mood, MD;  Location: [facility] ENDOSCOPY;  Service: Gastroenterology;  Laterality: N/A;   FLEXIBLE SIGMOIDOSCOPY N/A 08/04/2020   Procedure: FLEXIBLE SIGMOIDOSCOPY;  Surgeon: Wyline Mood, MD;  Location: [facility] ENDOSCOPY;  Service: Gastroenterology;  Laterality: N/A;  Per Dr. Tobi Bastos, unsedated   FLEXIBLE SIGMOIDOSCOPY N/A 08/31/2021   Procedure: FLEXIBLE SIGMOIDOSCOPY;  Surgeon: Wyline Mood, MD;  Location: [facility] ENDOSCOPY;  Service: Gastroenterology;  Laterality:  N/A;  no sedation   HIP ARTHROPLASTY Right 09/27/2020   Procedure: ARTHROPLASTY BIPOLAR HIP (HEMIARTHROPLASTY);  Surgeon: Kennedy Bucker, MD;  Location: [facility] ORS;  Service: Orthopedics;  Laterality: Right;   JOINT REPLACEMENT     PULMONARY THROMBECTOMY N/A 05/08/2022   Procedure: PULMONARY THROMBECTOMY;  Surgeon: Annice Needy, MD;  Location: [facility] INVASIVE CV LAB;  Service: Cardiovascular;  Laterality: N/A;   ROTATOR CUFF REPAIR Bilateral    left-12/2009; right-03/2009 dr.[email or web address]   TONSILLECTOMY     TOTAL SHOULDER REPLACEMENT Left      FAMILY HISTORY   Family History  Problem Relation Age of Onset   COPD Mother    Cancer Brother        colon     SOCIAL HISTORY   Social History   Tobacco Use   Smoking status: Never   Smokeless tobacco: Never  Vaping Use   Vaping Use: Never used  Substance Use Topics   Alcohol use: No   Drug use: No     MEDICATIONS    Home Medication:     Current Medication:  Current Facility-Administered Medications:    0.9 %  sodium chloride infusion, 250 mL, Intravenous, PRN, Wyn Quaker, Marlow Baars, MD   acetaminophen (TYLENOL) tablet 650 mg, 650 mg, Oral, Q6H PRN, Enedina Finner, MD, 650 mg at 05/18/22 0212   acidophilus (RISAQUAD) capsule 2 capsule, 2 capsule, Oral, TID, Wyn Quaker, Marlow Baars, MD, 2 capsule at 05/18/22 0917   Chlorhexidine Gluconate Cloth 2 % PADS 6 each, 6 each, Topical, Daily, Dew, Marlow Baars, MD, 6 each at 05/14/22 0920   citalopram (CELEXA) tablet 10 mg, 10 mg, Oral, Daily, Dew, Marlow Baars, MD, 10 mg at 05/18/22 1610   cyanocobalamin (VITAMIN B12) tablet 1,000 mcg, 1,000 mcg, Oral, Becky Sax, Sona, MD, 1,000 mcg at 05/17/22 9604   diclofenac Sodium (VOLTAREN) 1 % topical gel 2 g, 2 g, Topical, TID PRN, Enedina Finner, MD   docusate sodium (COLACE) capsule 100 mg, 100 mg, Oral, BID PRN, Darlin Priestly, MD, 100 mg at 05/17/22 1420   feeding supplement (BOOST / RESOURCE BREEZE) liquid 1 Container, 1 Container, Oral, TID BM, Enedina Finner, MD, 1 Container at 05/17/22  1420   levalbuterol (XOPENEX) nebulizer solution 0.63 mg, 0.63 mg, Nebulization, TID, Karna Christmas, Joss Mcdill, MD, 0.63 mg at 05/18/22 0840   ondansetron (ZOFRAN) injection 4 mg, 4 mg, Intravenous, Q6H PRN, Dew, Marlow Baars, MD   polyethylene glycol (MIRALAX / GLYCOLAX) packet 17 g, 17 g, Oral, BID PRN, Darlin Priestly, MD, 17 g at 05/18/22 [id number]  polyethylene glycol (MIRALAX / GLYCOLAX) packet 34 g, 34 g, Oral, Q2H, Darlin Priestly, MD   predniSONE (DELTASONE) tablet 40 mg, 40 mg, Oral, Q breakfast, Jerome Otter, MD, 40 mg at 05/18/22 0916   sodium chloride flush (NS) 0.9 % injection 3 mL, 3 mL, Intravenous, Q12H, Dew, Marlow Baars, MD, 3 mL at 05/18/22 0920   sodium chloride flush (NS) 0.9 % injection 3 mL, 3 mL, Intravenous, PRN, Dew, Marlow Baars, MD    ALLERGIES   Patient has no known allergies.     REVIEW OF SYSTEMS    Review of Systems:  Gen:  Denies  fever, sweats, chills weigh loss  HEENT: Denies blurred vision, double vision, ear pain, eye pain, hearing loss, nose bleeds, sore throat Cardiac:  No dizziness, chest pain or heaviness, chest tightness,edema Resp:   reports dyspnea chronically  Gi: Denies swallowing difficulty, stomach pain, nausea or vomiting, diarrhea, constipation, bowel incontinence Gu:  Denies bladder incontinence, burning urine Ext:   Denies Joint pain, stiffness or swelling Skin: Denies  skin rash, easy bruising or bleeding or hives Endoc:  Denies polyuria, polydipsia , polyphagia or weight change Psych:   Denies depression, insomnia or hallucinations   Other:  All other systems negative   VS: BP 117/76   Pulse 88   Temp 98.4 F (36.9 C) (Oral)   Resp (!) 27   Ht 4\' 10"  (1.473 m)   Wt 49.4 kg   SpO2 95%   BMI 22.78 kg/m      PHYSICAL EXAM    GENERAL:NAD, no fevers, chills, no weakness no fatigue HEAD: Normocephalic, atraumatic.  EYES: Pupils equal, round, reactive to light. Extraocular muscles intact. No scleral icterus.  MOUTH: Moist mucosal membrane. Dentition  intact. No abscess noted.  EAR, NOSE, THROAT: Clear without exudates. No external lesions.  NECK: Supple. No thyromegaly. No nodules. No JVD.  PULMONARY: decreased breath sounds with mild rhonchi worse at bases bilaterally.  CARDIOVASCULAR: S1 and S2. Regular rate and rhythm. No murmurs, rubs, or gallops. No edema. Pedal pulses 2+ bilaterally.  GASTROINTESTINAL: Soft, nontender, nondistended. No masses. Positive bowel sounds. No hepatosplenomegaly.  MUSCULOSKELETAL: No swelling, clubbing, or edema. Range of motion full in all extremities.  NEUROLOGIC: Cranial nerves II through XII are intact. No gross focal neurological deficits. Sensation intact. Reflexes intact.  SKIN: No ulceration, lesions, rashes, or cyanosis. Skin warm and dry. Turgor intact.  PSYCHIATRIC: Mood, affect within normal limits. The patient is awake, alert and oriented x 3. Insight, judgment intact.       IMAGING         ASSESSMENT/PLAN   Acute on chronic hypoxemic respiratory failure    - abnormal CT chest with interval development of inflammatory changes as evidenced with serial CT chest above   - possible overlying interstitial edema   - RVP is negative and remainder of micro is negative   - ESR/CRP trending  -currently on prednisone 40mg   - the situation is very difficult because she benefits from steroids with ulcerative colitis and ILD but it potentiates infection including Cdiff so therapy is further complicated   - stopping diuresis due to GI losses   - colitis seems to be mostly inflammatory , appreciate ID evaluation     Pulmonary fibrosis - chronic   - on esbriet - please continue as current  -PT /OT as able             Thank you for allowing me to participate in the care of this patient.  Patient/Family are satisfied with care plan and all questions have been answered.    Provider disclosure: Patient with at least one acute or chronic illness or injury that poses a threat to life or  bodily function and is being managed actively during this encounter.  All of the below services have been performed independently by signing provider:  review of prior documentation from internal and or external health records.  Review of previous and current lab results.  Interview and comprehensive assessment during patient visit today. Review of current and previous chest radiographs/CT scans. Discussion of management and test interpretation with health care team and patient/family.   This document was prepared using Dragon voice recognition software and may include unintentional dictation errors.     Vida Rigger, M.D.  Division of Pulmonary & Critical Care Medicine

## 2022-05-19 ENCOUNTER — Inpatient Hospital Stay: Payer: Medicare HMO

## 2022-05-19 DIAGNOSIS — J9601 Acute respiratory failure with hypoxia: Secondary | ICD-10-CM | POA: Diagnosis not present

## 2022-05-19 LAB — BASIC METABOLIC PANEL
Anion gap: 8 (ref 5–15)
BUN: 39 mg/dL — ABNORMAL HIGH (ref 8–23)
CO2: 33 mmol/L — ABNORMAL HIGH (ref 22–32)
Calcium: 8.9 mg/dL (ref 8.9–10.3)
Chloride: 100 mmol/L (ref 98–111)
Creatinine, Ser: 0.72 mg/dL (ref 0.44–1.00)
GFR, Estimated: >60 mL/min (ref >60)
Glucose, Bld: 107 mg/dL — ABNORMAL HIGH (ref 70–99)
Potassium: 4.7 mmol/L (ref 3.5–5.1)
Sodium: 141 mmol/L (ref 135–145)

## 2022-05-19 LAB — CBC
HCT: 35 % — ABNORMAL LOW (ref 36.0–46.0)
Hemoglobin: 10.9 g/dL — ABNORMAL LOW (ref 12.0–15.0)
MCH: 28.8 pg (ref 26.0–34.0)
MCHC: 31.1 g/dL (ref 30.0–36.0)
MCV: 92.6 fL (ref 80.0–100.0)
Platelets: 297 10*3/uL (ref 150–400)
RBC: 3.78 MIL/uL — ABNORMAL LOW (ref 3.87–5.11)
RDW: 16.6 % — ABNORMAL HIGH (ref 11.5–15.5)
WBC: 19.3 10*3/uL — ABNORMAL HIGH (ref 4.0–10.5)
nRBC: 0 % (ref 0.0–0.2)

## 2022-05-19 LAB — MAGNESIUM: Magnesium: 3.1 mg/dL — ABNORMAL HIGH (ref 1.7–2.4)

## 2022-05-19 MED ORDER — HEPARIN SODIUM (PORCINE) 5000 UNIT/ML IJ SOLN
5000.0000 [IU] | Freq: Three times a day (TID) | INTRAMUSCULAR | Status: DC
  Administered 2022-05-19 – 2022-05-23 (×11): 5000 [IU] via SUBCUTANEOUS
  Filled 2022-05-19 (×11): qty 1

## 2022-05-19 MED ORDER — ORAL CARE MOUTH RINSE: 15.0000 mL | OROMUCOSAL | Status: DC | PRN

## 2022-05-19 MED ORDER — PREDNISONE 20 MG PO TABS
30.0000 mg | ORAL_TABLET | Freq: Every day | ORAL | Status: DC
  Administered 2022-05-20: 30 mg via ORAL
  Filled 2022-05-19: qty 1

## 2022-05-19 NOTE — Progress Notes (Signed)
PULMONOLOGY         Date: 05/19/2022,   MRN# [id number] [name] 1935-10-04     AdmissionWeight: 49.4 kg                 CurrentWeight: 49.4 kg  Referring provider: [name]   CHIEF COMPLAINT:   Acute on chronic hypoxemic respiratory failure   HISTORY OF PRESENT ILLNESS   This is a pleasant 86 yo F with hx of UC, ILD with Pulmonary fibrosis, chronic pain syndrome, who came in due to acute on chronic hypoxemia. She reports increased O2 requirement at home with worsening cough, she denies flu like illness or fevers.  Labwork reveals elevated BNP   05/02/22 - patient is improved clinically, RVP is negative , does not appear to have ongoing infection s/p ID eval with de-escalation of antimicrobials. Placed on eliquis for acute PE with mild clot burden.  CRP and ESR was not collected and I have reordered this. She is nontachypneic during exam speaking in full sentences. Mild hypokalemia on labs has been repleted and repleted magnesium as well.    05/04/22- Patient is a bit uncomfortable this am. She reports worsening diarreah and abd discomfort.  She had 5 loose stools overnight. She has not been able to wean down much from yesterday on O2. We repleted her K but due to GI losses she has hypokalemia still.  Will obtain pharmacy consult to help with ongong electrolyte shift.  Reduced steroids to 60IV once daily, have added TID scheduled tussinex 2.67mL to help with diarreah since we think its non-infectious.  CXR done and reviewed this AM , there are diffuse bilateral infiltrates but improved from rior.   05/07/22- patient had several bloody episodes of loose stools. She thinks today this is slightly better. Her inflammatory biomarkers are still elevated. Her overall presentation seems like a UC patient with COVID19 infection, I wonder if she has an undetectable strain with current testing platform.  O2 requirement is unchanged from 2 days ago.  Im considering vascular surgery  consult for thromectomy evaluation since we have not had much more improvemetn over past 48h and unable to use blood thinners any longer due to onging GI bleeds with UC. Fungitell is still inprocess , remainder of infectiuos workup is negative. I have reduced steroids today. To 40mg  solumedrol  05/08/22-patient is improved on 10L/min [place]. She has thrombectomy planned for today. Will keep solumedrol at 40mg  IV daily. She reports feeling better and is smiling during my evaluation.  Husband at bedside we reivewed medical plan.    05/09/22- patient is on HFNC post thrombectomy, she had a setback post operatively may be due to physical stress.  She does have atelectasis on cxr and we have initiated metaneb q8h with saline. Additional covid testing in process due to clinical suspicion of false negative.   05/10/22- patient is improved slightly and has been weaned from HFNC from 75% to 65%.  We changed code status to DNR today.   05/11/22- patient is slightly improved with less O2 requirement and weaning in process.  Have reduced steroids to PO prednisone 50mg . Plan to continue current plan and start PT.  Plan Reviewed with attending physician.   05/13/22- patient feels slightly improved, plan today to get OOB more, her loose stools have essentially resolved.  No new events.  Her spO2 goal is anything >80%.  CXR repeat today.  Reduced PO prednisone  05/14/22- patient is weaned to 6L/min and is working  with PT to get out of bed, she took few steps from bed to chair today.    05/15/22 - patient is on 6L/m [place] she had mild resp distress with ambulation. She is in no distress.   05/16/22- patient is on 10L/min she did diurese better overnight but not much improved on her O2 therapy.  She is not ready for hospice, she wants to keep fighting for now.  I spoke with [facility] regarding LTACH palcement.  Patient profile will be reviewed for this. Will dc diuretics today.   05/17/22- patient is slightly improved at 8L/min, she  had BM today. I spoke with husband and we are still planning on [facility] for long term rehab. Plan to reduce prednisone by 5mg  per week for slow taper please.   05/18/22 - patient is weaned to 5L/min with episodic desaturation but overall pretty good and stable. She did incentive spirometery with me and current tidal volume is up to 300cc.  She is constipated, I think this is due to hypovolemic status overall post diuresis.  She has miralax and colace.  I have allowed patient to have a large Lemonade today to rehydrate and enjoy something PO. I have reordered zithromax since patient is on long term steroids for secondary prevention of pneumonia.    05/19/22- patient stable on 5L/m [place] saturating >95% while resting. She is with episodes of tachypnea but mostly calm nonlabored breathing.  Reduced steroids to 30mg  today. Repeat CXR for itnerval from 05/13/22. Electrolyte derrangement resolved. Daughter is at bedside, patient is moving all 4 extermities for range of motion exercise.   PAST MEDICAL HISTORY   Past Medical History:  Diagnosis Date   Altered mental state    Anginal pain (HCC)    Anxiety    Basal cell carcinoma 05/03/2021   right neck postauricular - Excised 06/12/21   BMI 30.0-30.9,adult    Cellulitis    Chest pain, atypical    Compression fracture of L4 lumbar vertebra    Constipation    COVID-19 virus infection 05/28/2020   Cystitis    Cystocele    Diverticulosis    Fatigue    Fibrosis, idiopathic pulmonary (HCC)    Gastroenteritis    History of back surgery    History of herniated intervertebral disc    HTN (hypertension)    Hypercholesteremia    Hypocalcemia    Incontinence of urine    OA (osteoarthritis)    shoulder and back   Obesity    Osteopenia    Pneumonia    Shortness of breath    Sleep apnea    Squamous cell carcinoma of skin 07/16/2016   Right upper lateral eyebrow. SCCis.   Squamous cell carcinoma of skin 01/13/2018   Left temporal hairline. WD SCC with  superficial infiltration.   Thrush    Vitamin D deficiency      SURGICAL HISTORY   Past Surgical History:  Procedure Laterality Date   BACK SURGERY     CATARACT EXTRACTION W/PHACO Left 04/24/2016   Procedure: CATARACT EXTRACTION PHACO AND INTRAOCULAR LENS PLACEMENT (IOC);  Surgeon: Lockie Mola, MD;  Location: [facility];  Service: Ophthalmology;  Laterality: Left;   CATARACT EXTRACTION W/PHACO Right 07/14/2017   Procedure: CATARACT EXTRACTION PHACO AND INTRAOCULAR LENS PLACEMENT (IOC)  RIGHT;  Surgeon: Lockie Mola, MD;  Location: [facility];  Service: Ophthalmology;  Laterality: Right;   CHOLECYSTECTOMY     COLONOSCOPY WITH PROPOFOL N/A 02/01/2022   Procedure: COLONOSCOPY WITH PROPOFOL;  Surgeon: Tobi Bastos,  Sharlet Salina, MD;  Location: [facility] ENDOSCOPY;  Service: Gastroenterology;  Laterality: N/A;   CYSTOSCOPY/URETEROSCOPY/HOLMIUM LASER/STENT PLACEMENT Left 04/27/2021   Procedure: CYSTOSCOPY/URETEROSCOPY/HOLMIUM LASER/STENT PLACEMENT;  Surgeon: Sondra Come, MD;  Location: [facility] ORS;  Service: Urology;  Laterality: Left;   ESOPHAGOGASTRODUODENOSCOPY (EGD) WITH PROPOFOL N/A 10/13/2018   Procedure: ESOPHAGOGASTRODUODENOSCOPY (EGD) WITH PROPOFOL;  Surgeon: Wyline Mood, MD;  Location: [facility] ENDOSCOPY;  Service: Gastroenterology;  Laterality: N/A;   ESOPHAGOGASTRODUODENOSCOPY (EGD) WITH PROPOFOL N/A 11/22/2021   Procedure: ESOPHAGOGASTRODUODENOSCOPY (EGD) WITH PROPOFOL;  Surgeon: Wyline Mood, MD;  Location: [facility] ENDOSCOPY;  Service: Gastroenterology;  Laterality: N/A;   EYE SURGERY     FLEXIBLE SIGMOIDOSCOPY N/A 10/13/2018   Procedure: FLEXIBLE SIGMOIDOSCOPY;  Surgeon: Wyline Mood, MD;  Location: [facility] ENDOSCOPY;  Service: Gastroenterology;  Laterality: N/A;   FLEXIBLE SIGMOIDOSCOPY N/A 09/09/2019   Procedure: FLEXIBLE SIGMOIDOSCOPY;  Surgeon: Wyline Mood, MD;  Location: [facility] ENDOSCOPY;  Service: Gastroenterology;  Laterality: N/A;   FLEXIBLE SIGMOIDOSCOPY N/A 08/04/2020    Procedure: FLEXIBLE SIGMOIDOSCOPY;  Surgeon: Wyline Mood, MD;  Location: [facility] END[facility];  Service: Gastroenterology;  Laterality: N/A;  Per Dr. Tobi Bastos, unsedated   FLEXIBLE SIGMOIDOSCOPY N/A 08/31/2021   Procedure: FLEXIBLE SIGMOIDOSCOPY;  Surgeon: Wyline Mood, MD;  Location: [facility] ENDOSCOPY;  Service: Gastroenterology;  Laterality: N/A;  no sedation   HIP ARTHROPLASTY Right 09/27/2020   Procedure: ARTHROPLASTY BIPOLAR HIP (HEMIARTHROPLASTY);  Surgeon: Kennedy Bucker, MD;  Location: [facility] ORS;  Service: Orthopedics;  Laterality: Right;   JOINT REPLACEMENT     PULMONARY THROMBECTOMY N/A 05/08/2022   Procedure: PULMONARY THROMBECTOMY;  Surgeon: Annice Needy, MD;  Location: [facility] INVASIVE CV LAB;  Service: Cardiovascular;  Laterality: N/A;   ROTATOR CUFF REPAIR Bilateral    left-12/2009; right-03/2009 dr.califf   TONSILLECTOMY     TOTAL SHOULDER REPLACEMENT Left      FAMILY HISTORY   Family History  Problem Relation Age of Onset   COPD Mother    Cancer Brother        colon     SOCIAL HISTORY   Social History   Tobacco Use   Smoking status: Never   Smokeless tobacco: Never  Vaping Use   Vaping Use: Never used  Substance Use Topics   Alcohol use: No   Drug use: No     MEDICATIONS    Home Medication:     Current Medication:  Current Facility-Administered Medications:    0.9 %  sodium chloride infusion, 250 mL, Intravenous, PRN, Wyn Quaker, Marlow Baars, MD   acetaminophen (TYLENOL) tablet 650 mg, 650 mg, Oral, Q6H PRN, Enedina Finner, MD, 650 mg at 05/18/22 0212   acidophilus (RISAQUAD) capsule 2 capsule, 2 capsule, Oral, TID, Dew, Marlow Baars, MD, 2 capsule at 05/19/22 1026   azithromycin (ZITHROMAX) tablet 500 mg, 500 mg, Oral, Daily, Karna Christmas, Juanantonio Stolar, MD, 500 mg at 05/19/22 1025   citalopram (CELEXA) tablet 10 mg, 10 mg, Oral, Daily, Dew, Marlow Baars, MD, 10 mg at 05/19/22 1025   cyanocobalamin (VITAMIN B12) tablet 1,000 mcg, 1,000 mcg, Oral, Becky Sax, Sona, MD, 1,000 mcg at 05/19/22 1026    diclofenac Sodium (VOLTAREN) 1 % topical gel 2 g, 2 g, Topical, TID PRN, Enedina Finner, MD   docusate sodium (COLACE) capsule 100 mg, 100 mg, Oral, BID PRN, Darlin Priestly, MD, 100 mg at 05/17/22 1420   feeding supplement (BOOST / RESOURCE BREEZE) liquid 1 Container, 1 Container, Oral, TID BM, Enedina Finner, MD, 1 Container at 05/18/22 1624   levalbuterol (XOPENEX) nebulizer solution 0.63 mg, 0.63 mg, Nebulization, TID,  Vida Rigger, MD, 0.63 mg at 05/19/22 1331   ondansetron (ZOFRAN) injection 4 mg, 4 mg, Intravenous, Q6H PRN, Annice Needy, MD, 4 mg at 05/18/22 2055   Oral care mouth rinse, 15 mL, Mouth Rinse, PRN, Darlin Priestly, MD   polyethylene glycol (MIRALAX / GLYCOLAX) packet 17 g, 17 g, Oral, BID PRN, Darlin Priestly, MD, 17 g at 05/18/22 0203   predniSONE (DELTASONE) tablet 35 mg, 35 mg, Oral, Q breakfast, Jewelz Kobus, MD, 35 mg at 05/19/22 1025   sodium chloride flush (NS) 0.9 % injection 3 mL, 3 mL, Intravenous, Q12H, Dew, Marlow Baars, MD, 3 mL at 05/19/22 1353   sodium chloride flush (NS) 0.9 % injection 3 mL, 3 mL, Intravenous, PRN, Wyn Quaker, Marlow Baars, MD    ALLERGIES   Patient has no known allergies.     REVIEW OF SYSTEMS    Review of Systems:  Gen:  Denies  fever, sweats, chills weigh loss  HEENT: Denies blurred vision, double vision, ear pain, eye pain, hearing loss, nose bleeds, sore throat Cardiac:  No dizziness, chest pain or heaviness, chest tightness,edema Resp:   reports dyspnea chronically  Gi: Denies swallowing difficulty, stomach pain, nausea or vomiting, diarrhea, constipation, bowel incontinence Gu:  Denies bladder incontinence, burning urine Ext:   Denies Joint pain, stiffness or swelling Skin: Denies  skin rash, easy bruising or bleeding or hives Endoc:  Denies polyuria, polydipsia , polyphagia or weight change Psych:   Denies depression, insomnia or hallucinations   Other:  All other systems negative   VS: BP 132/76   Pulse 86   Temp 98.4 F (36.9 C)   Resp (!) 29    Ht 4\' 10"  (1.473 m)   Wt 49.4 kg   SpO2 96%   BMI 22.78 kg/m      PHYSICAL EXAM    GENERAL:NAD, no fevers, chills, no weakness no fatigue HEAD: Normocephalic, atraumatic.  EYES: Pupils equal, round, reactive to light. Extraocular muscles intact. No scleral icterus.  MOUTH: Moist mucosal membrane. Dentition intact. No abscess noted.  EAR, NOSE, THROAT: Clear without exudates. No external lesions.  NECK: Supple. No thyromegaly. No nodules. No JVD.  PULMONARY: decreased breath sounds with mild rhonchi worse at bases bilaterally.  CARDIOVASCULAR: S1 and S2. Regular rate and rhythm. No murmurs, rubs, or gallops. No edema. Pedal pulses 2+ bilaterally.  GASTROINTESTINAL: Soft, nontender, nondistended. No masses. Positive bowel sounds. No hepatosplenomegaly.  MUSCULOSKELETAL: No swelling, clubbing, or edema. Range of motion full in all extremities.  NEUROLOGIC: Cranial nerves II through XII are intact. No gross focal neurological deficits. Sensation intact. Reflexes intact.  SKIN: No ulceration, lesions, rashes, or cyanosis. Skin warm and dry. Turgor intact.  PSYCHIATRIC: Mood, affect within normal limits. The patient is awake, alert and oriented x 3. Insight, judgment intact.       IMAGING         ASSESSMENT/PLAN   Acute on chronic hypoxemic respiratory failure    - abnormal CT chest with interval development of inflammatory changes as evidenced with serial CT chest above   - possible overlying interstitial edema   - RVP is negative and remainder of micro is negative   - ESR/CRP trending  -currently on prednisone 40mg   - the situation is very difficult because she benefits from steroids with ulcerative colitis and ILD but it potentiates infection including Cdiff so therapy is further complicated   - stopping diuresis due to GI losses   - colitis seems to be mostly inflammatory , appreciate  ID evaluation     Pulmonary fibrosis - chronic   - on esbriet - please continue as  current  -PT /OT as able             Thank you for allowing me to participate in the care of this patient.   Patient/Family are satisfied with care plan and all questions have been answered.    Provider disclosure: Patient with at least one acute or chronic illness or injury that poses a threat to life or bodily function and is being managed actively during this encounter.  All of the below services have been performed independently by signing provider:  review of prior documentation from internal and or external health records.  Review of previous and current lab results.  Interview and comprehensive assessment during patient visit today. Review of current and previous chest radiographs/CT scans. Discussion of management and test interpretation with health care team and patient/family.   This document was prepared using Dragon voice recognition software and may include unintentional dictation errors.     Vida Rigger, M.D.  Division of Pulmonary & Critical Care Medicine

## 2022-05-19 NOTE — Progress Notes (Signed)
PROGRESS NOTE    [name]  NWG:[id number] DOB: [birth year] DOA: 05/01/2022 PCP: [name], NP  [id number]  LOS: 18 days   Brief hospital course: No notes on file  Assessment & Plan: [name] is a 86 y.o. female with medical history significant for ILD, recurrent c diff, ulcerative colitis, chronic pain, chronic hypoxic respiratory failure, who presented with dyspnea and palpitations.   Reports a decline of several months with acute worsening the days PTA.  Reports having to increase baseline home o2 of 2-3 liters to 4 liters lately, has had several months of hypoxia, and has worsened over past few weeks to being dyspneic at rest worse with minimal ambulation.    Acute on chronic hypoxemic respiratory failure --recently, pt has been on 4-5L home O2.   --covid and RVP neg.  likely progressive ILD, pulm edema, and small PE contributing.  Questionable PNA.  Started on tx for all 4 etiologies on admission. --O2 requirement improved today, to 5L, sating in 90's. --Continue supplemental O2 to keep sats between 88-92%, wean as tolerated --cont chest physiotherapy    ILD Underlying severe ILD that is likely progressing but at least several days of acutely worsening dyspnea.  --started on prednisone 50 mg daily on admission and escalated up to IV solumedrol. --pulm consulted, Dr. Lanney Gins Plan: --cont prednisone, 30 mg today with taper --cont azithromycin, per pulm --Albuterol neb scheduled, per pulm   Bloody Diarrhea, improved H/o UC --C diff neg on presentation.  Diarrhea likely due to abx pt received during the first 2 days, and blood likely due to Eliquis --GI consult with Dr. [name]-- recommends avoid antidiarrheals, follow-up with Dr. [name].I. after hospital discharge.  --hold Eliquis   Acute blood loss anemia --Hgb dropped from 12.5 to 8.7 in 3 days from bloody diarrhea --s/p 1u pRBC for Hgb 7.8 --Monitor Hgb and transfuse to keep Hgb >8    Pulmonary embolus, acute s/p Mechanical thrombectomy and IVC filter per Dr Jefferey Pica on CT, doubt it's the main driver for her dyspnea.   --hold Eliquis due to bloody diarrhea  AKI --Cr went up to 1.68, from baseline of 0.7 --likely 2/2 to dehydration from diarrhea --improved with IVF   Afib w RVR --s/p amiodarone gtt --currently in NSR    Acute on chronic diastolic CHF --pulm edema seen on CT, elevated BNP --started on IV lasix 40 mg daily, d/c'ed on 9/2 --give prn lasix   CAP, ruled out --ID consulted, and ruled out PNA, abx d/c'ed after 2 days.   History recurrent C diff --has multiple BM's per day, sometimes loose, but pt said that's her baseline --C diff neg on presentation   Chronic pain - home norco prn   Hypokalemia --likely due to diarrhea --monitor and replete PRN   DVT prophylaxis: SCD/Compression stockings Code Status: DNR  Family Communication: son updated at bedside today Level of care: Progressive Dispo:   The patient is from: home Anticipated d/c is to: pt wants to return home Anticipated d/c date is: 2-3 days   Subjective and Interval History:  Pt had large BM last night with aggressive Miralax.  Pt reported feeling much relieved.  O2 requirement improved today.     Objective: Vitals:   05/19/22 1331 05/19/22 1400 05/19/22 1500 05/19/22 1600  BP:  100/66  108/66  Pulse:  82 82 82  Resp:  (!) 23 (!) 29 (!) 27  Temp:    98.7 F (37.1 C)  TempSrc:  SpO2: 96% 95% 94% 93%  Weight:      Height:       No intake or output data in the 24 hours ending 05/19/22 1630  Filed Weights   05/01/22 0753  Weight: 49.4 kg    Examination:   Constitutional: NAD, AAOx3 HEENT: conjunctivae and lids normal, EOMI CV: No cyanosis.   RESP: normal respiratory effort, on 5L SKIN: warm, dry Neuro: II - XII grossly intact.   Psych: Normal mood and affect.  Appropriate judgement and reason   Data Reviewed: I have personally reviewed labs and  imaging studies  Time spent: 25 minutes  Enzo Bi, MD [facility] If 7PM-7AM, please contact night-coverage 05/19/2022, 4:30 PM

## 2022-05-19 NOTE — Progress Notes (Signed)
Pt unable to drink the Magnesium Citrate. Pt became nauseous and vomited. Pt received zofran.

## 2022-05-20 DIAGNOSIS — J9601 Acute respiratory failure with hypoxia: Secondary | ICD-10-CM | POA: Diagnosis not present

## 2022-05-20 LAB — CBC
HCT: 30.9 % — ABNORMAL LOW (ref 36.0–46.0)
Hemoglobin: 9.7 g/dL — ABNORMAL LOW (ref 12.0–15.0)
MCH: 29.8 pg (ref 26.0–34.0)
MCHC: 31.4 g/dL (ref 30.0–36.0)
MCV: 95.1 fL (ref 80.0–100.0)
Platelets: 250 10*3/uL (ref 150–400)
RBC: 3.25 MIL/uL — ABNORMAL LOW (ref 3.87–5.11)
RDW: 16.4 % — ABNORMAL HIGH (ref 11.5–15.5)
WBC: 13.4 10*3/uL — ABNORMAL HIGH (ref 4.0–10.5)
nRBC: 0 % (ref 0.0–0.2)

## 2022-05-20 LAB — BASIC METABOLIC PANEL
Anion gap: 6 (ref 5–15)
BUN: 35 mg/dL — ABNORMAL HIGH (ref 8–23)
CO2: 35 mmol/L — ABNORMAL HIGH (ref 22–32)
Calcium: 8.6 mg/dL — ABNORMAL LOW (ref 8.9–10.3)
Chloride: 99 mmol/L (ref 98–111)
Creatinine, Ser: 0.82 mg/dL (ref 0.44–1.00)
GFR, Estimated: >60 mL/min (ref >60)
Glucose, Bld: 87 mg/dL (ref 70–99)
Potassium: 4.8 mmol/L (ref 3.5–5.1)
Sodium: 140 mmol/L (ref 135–145)

## 2022-05-20 LAB — MAGNESIUM: Magnesium: 2.7 mg/dL — ABNORMAL HIGH (ref 1.7–2.4)

## 2022-05-20 NOTE — Progress Notes (Signed)
PULMONOLOGY         Date: 05/20/2022,   MRN# [id number] [name] [birth year]     AdmissionWeight: 49.4 kg                 CurrentWeight: 49.4 kg  Referring provider: Dr Si Raider   CHIEF COMPLAINT:   Acute on chronic hypoxemic respiratory failure   HISTORY OF PRESENT ILLNESS   This is a pleasant 86 yo F with hx of UC, ILD with Pulmonary fibrosis, chronic pain syndrome, who came in due to acute on chronic hypoxemia. She reports increased O2 requirement at home with worsening cough, she denies flu like illness or fevers.  Labwork reveals elevated BNP   05/02/22 - patient is improved clinically, RVP is negative , does not appear to have ongoing infection s/p ID eval with de-escalation of antimicrobials. Placed on eliquis for acute PE with mild clot burden.  CRP and ESR was not collected and I have reordered this. She is nontachypneic during exam speaking in full sentences. Mild hypokalemia on labs has been repleted and repleted magnesium as well.    05/04/22- Patient is a bit uncomfortable this am. She reports worsening diarreah and abd discomfort.  She had 5 loose stools overnight. She has not been able to wean down much from yesterday on O2. We repleted her K but due to GI losses she has hypokalemia still.  Will obtain pharmacy consult to help with ongong electrolyte shift.  Reduced steroids to 60IV once daily, have added TID scheduled tussinex 2.74m to help with diarreah since we think its non-infectious.  CXR done and reviewed this AM , there are diffuse bilateral infiltrates but improved from rior.   05/07/22- patient had several bloody episodes of loose stools. She thinks today this is slightly better. Her inflammatory biomarkers are still elevated. Her overall presentation seems like a UC patient with COVID19 infection, I wonder if she has an undetectable strain with current testing platform.  O2 requirement is unchanged from 2 days ago.  Im considering vascular surgery  consult for thromectomy evaluation since we have not had much more improvemetn over past 48h and unable to use blood thinners any longer due to onging GI bleeds with UC. Fungitell is still inprocess , remainder of infectiuos workup is negative. I have reduced steroids today. To 468msolumedrol  05/08/22-patient is improved on 10L/min [place]. She has thrombectomy planned for today. Will keep solumedrol at 4066mV daily. She reports feeling better and is smiling during my evaluation.  Husband at bedside we reivewed medical plan.    05/09/22- patient is on HFNC post thrombectomy, she had a setback post operatively may be due to physical stress.  She does have atelectasis on cxr and we have initiated metaneb q8h with saline. Additional covid testing in process due to clinical suspicion of false negative.   05/10/22- patient is improved slightly and has been weaned from HFNC from 75% to 65%.  We changed code status to DNR today.   05/11/22- patient is slightly improved with less O2 requirement and weaning in process.  Have reduced steroids to PO prednisone 15m67mlan to continue current plan and start PT.  Plan Reviewed with attending physician.   05/13/22- patient feels slightly improved, plan today to get OOB more, her loose stools have essentially resolved.  No new events.  Her spO2 goal is anything >80%.  CXR repeat today.  Reduced PO prednisone  05/14/22- patient is weaned to 6L/min and is working  PULMONOLOGY         Date: 05/20/2022,   MRN# [id number] [name] [birth year]     AdmissionWeight: 49.4 kg                 CurrentWeight: 49.4 kg  Referring provider: Dr Si Raider   CHIEF COMPLAINT:   Acute on chronic hypoxemic respiratory failure   HISTORY OF PRESENT ILLNESS   This is a pleasant 86 yo F with hx of UC, ILD with Pulmonary fibrosis, chronic pain syndrome, who came in due to acute on chronic hypoxemia. She reports increased O2 requirement at home with worsening cough, she denies flu like illness or fevers.  Labwork reveals elevated BNP   05/02/22 - patient is improved clinically, RVP is negative , does not appear to have ongoing infection s/p ID eval with de-escalation of antimicrobials. Placed on eliquis for acute PE with mild clot burden.  CRP and ESR was not collected and I have reordered this. She is nontachypneic during exam speaking in full sentences. Mild hypokalemia on labs has been repleted and repleted magnesium as well.    05/04/22- Patient is a bit uncomfortable this am. She reports worsening diarreah and abd discomfort.  She had 5 loose stools overnight. She has not been able to wean down much from yesterday on O2. We repleted her K but due to GI losses she has hypokalemia still.  Will obtain pharmacy consult to help with ongong electrolyte shift.  Reduced steroids to 60IV once daily, have added TID scheduled tussinex 2.74m to help with diarreah since we think its non-infectious.  CXR done and reviewed this AM , there are diffuse bilateral infiltrates but improved from rior.   05/07/22- patient had several bloody episodes of loose stools. She thinks today this is slightly better. Her inflammatory biomarkers are still elevated. Her overall presentation seems like a UC patient with COVID19 infection, I wonder if she has an undetectable strain with current testing platform.  O2 requirement is unchanged from 2 days ago.  Im considering vascular surgery  consult for thromectomy evaluation since we have not had much more improvemetn over past 48h and unable to use blood thinners any longer due to onging GI bleeds with UC. Fungitell is still inprocess , remainder of infectiuos workup is negative. I have reduced steroids today. To 468msolumedrol  05/08/22-patient is improved on 10L/min [place]. She has thrombectomy planned for today. Will keep solumedrol at 4066mV daily. She reports feeling better and is smiling during my evaluation.  Husband at bedside we reivewed medical plan.    05/09/22- patient is on HFNC post thrombectomy, she had a setback post operatively may be due to physical stress.  She does have atelectasis on cxr and we have initiated metaneb q8h with saline. Additional covid testing in process due to clinical suspicion of false negative.   05/10/22- patient is improved slightly and has been weaned from HFNC from 75% to 65%.  We changed code status to DNR today.   05/11/22- patient is slightly improved with less O2 requirement and weaning in process.  Have reduced steroids to PO prednisone 15m67mlan to continue current plan and start PT.  Plan Reviewed with attending physician.   05/13/22- patient feels slightly improved, plan today to get OOB more, her loose stools have essentially resolved.  No new events.  Her spO2 goal is anything >80%.  CXR repeat today.  Reduced PO prednisone  05/14/22- patient is weaned to 6L/min and is working  PULMONOLOGY         Date: 05/20/2022,   MRN# [id number] [name] [birth year]     AdmissionWeight: 49.4 kg                 CurrentWeight: 49.4 kg  Referring provider: Dr Si Raider   CHIEF COMPLAINT:   Acute on chronic hypoxemic respiratory failure   HISTORY OF PRESENT ILLNESS   This is a pleasant 86 yo F with hx of UC, ILD with Pulmonary fibrosis, chronic pain syndrome, who came in due to acute on chronic hypoxemia. She reports increased O2 requirement at home with worsening cough, she denies flu like illness or fevers.  Labwork reveals elevated BNP   05/02/22 - patient is improved clinically, RVP is negative , does not appear to have ongoing infection s/p ID eval with de-escalation of antimicrobials. Placed on eliquis for acute PE with mild clot burden.  CRP and ESR was not collected and I have reordered this. She is nontachypneic during exam speaking in full sentences. Mild hypokalemia on labs has been repleted and repleted magnesium as well.    05/04/22- Patient is a bit uncomfortable this am. She reports worsening diarreah and abd discomfort.  She had 5 loose stools overnight. She has not been able to wean down much from yesterday on O2. We repleted her K but due to GI losses she has hypokalemia still.  Will obtain pharmacy consult to help with ongong electrolyte shift.  Reduced steroids to 60IV once daily, have added TID scheduled tussinex 2.74m to help with diarreah since we think its non-infectious.  CXR done and reviewed this AM , there are diffuse bilateral infiltrates but improved from rior.   05/07/22- patient had several bloody episodes of loose stools. She thinks today this is slightly better. Her inflammatory biomarkers are still elevated. Her overall presentation seems like a UC patient with COVID19 infection, I wonder if she has an undetectable strain with current testing platform.  O2 requirement is unchanged from 2 days ago.  Im considering vascular surgery  consult for thromectomy evaluation since we have not had much more improvemetn over past 48h and unable to use blood thinners any longer due to onging GI bleeds with UC. Fungitell is still inprocess , remainder of infectiuos workup is negative. I have reduced steroids today. To 468msolumedrol  05/08/22-patient is improved on 10L/min [place]. She has thrombectomy planned for today. Will keep solumedrol at 4066mV daily. She reports feeling better and is smiling during my evaluation.  Husband at bedside we reivewed medical plan.    05/09/22- patient is on HFNC post thrombectomy, she had a setback post operatively may be due to physical stress.  She does have atelectasis on cxr and we have initiated metaneb q8h with saline. Additional covid testing in process due to clinical suspicion of false negative.   05/10/22- patient is improved slightly and has been weaned from HFNC from 75% to 65%.  We changed code status to DNR today.   05/11/22- patient is slightly improved with less O2 requirement and weaning in process.  Have reduced steroids to PO prednisone 15m67mlan to continue current plan and start PT.  Plan Reviewed with attending physician.   05/13/22- patient feels slightly improved, plan today to get OOB more, her loose stools have essentially resolved.  No new events.  Her spO2 goal is anything >80%.  CXR repeat today.  Reduced PO prednisone  05/14/22- patient is weaned to 6L/min and is working  PULMONOLOGY         Date: 05/20/2022,   MRN# [id number] [name] [birth year]     AdmissionWeight: 49.4 kg                 CurrentWeight: 49.4 kg  Referring provider: Dr Si Raider   CHIEF COMPLAINT:   Acute on chronic hypoxemic respiratory failure   HISTORY OF PRESENT ILLNESS   This is a pleasant 86 yo F with hx of UC, ILD with Pulmonary fibrosis, chronic pain syndrome, who came in due to acute on chronic hypoxemia. She reports increased O2 requirement at home with worsening cough, she denies flu like illness or fevers.  Labwork reveals elevated BNP   05/02/22 - patient is improved clinically, RVP is negative , does not appear to have ongoing infection s/p ID eval with de-escalation of antimicrobials. Placed on eliquis for acute PE with mild clot burden.  CRP and ESR was not collected and I have reordered this. She is nontachypneic during exam speaking in full sentences. Mild hypokalemia on labs has been repleted and repleted magnesium as well.    05/04/22- Patient is a bit uncomfortable this am. She reports worsening diarreah and abd discomfort.  She had 5 loose stools overnight. She has not been able to wean down much from yesterday on O2. We repleted her K but due to GI losses she has hypokalemia still.  Will obtain pharmacy consult to help with ongong electrolyte shift.  Reduced steroids to 60IV once daily, have added TID scheduled tussinex 2.74m to help with diarreah since we think its non-infectious.  CXR done and reviewed this AM , there are diffuse bilateral infiltrates but improved from rior.   05/07/22- patient had several bloody episodes of loose stools. She thinks today this is slightly better. Her inflammatory biomarkers are still elevated. Her overall presentation seems like a UC patient with COVID19 infection, I wonder if she has an undetectable strain with current testing platform.  O2 requirement is unchanged from 2 days ago.  Im considering vascular surgery  consult for thromectomy evaluation since we have not had much more improvemetn over past 48h and unable to use blood thinners any longer due to onging GI bleeds with UC. Fungitell is still inprocess , remainder of infectiuos workup is negative. I have reduced steroids today. To 468msolumedrol  05/08/22-patient is improved on 10L/min [place]. She has thrombectomy planned for today. Will keep solumedrol at 4066mV daily. She reports feeling better and is smiling during my evaluation.  Husband at bedside we reivewed medical plan.    05/09/22- patient is on HFNC post thrombectomy, she had a setback post operatively may be due to physical stress.  She does have atelectasis on cxr and we have initiated metaneb q8h with saline. Additional covid testing in process due to clinical suspicion of false negative.   05/10/22- patient is improved slightly and has been weaned from HFNC from 75% to 65%.  We changed code status to DNR today.   05/11/22- patient is slightly improved with less O2 requirement and weaning in process.  Have reduced steroids to PO prednisone 15m67mlan to continue current plan and start PT.  Plan Reviewed with attending physician.   05/13/22- patient feels slightly improved, plan today to get OOB more, her loose stools have essentially resolved.  No new events.  Her spO2 goal is anything >80%.  CXR repeat today.  Reduced PO prednisone  05/14/22- patient is weaned to 6L/min and is working  PULMONOLOGY         Date: 05/20/2022,   MRN# [id number] [name] [birth year]     AdmissionWeight: 49.4 kg                 CurrentWeight: 49.4 kg  Referring provider: Dr Si Raider   CHIEF COMPLAINT:   Acute on chronic hypoxemic respiratory failure   HISTORY OF PRESENT ILLNESS   This is a pleasant 86 yo F with hx of UC, ILD with Pulmonary fibrosis, chronic pain syndrome, who came in due to acute on chronic hypoxemia. She reports increased O2 requirement at home with worsening cough, she denies flu like illness or fevers.  Labwork reveals elevated BNP   05/02/22 - patient is improved clinically, RVP is negative , does not appear to have ongoing infection s/p ID eval with de-escalation of antimicrobials. Placed on eliquis for acute PE with mild clot burden.  CRP and ESR was not collected and I have reordered this. She is nontachypneic during exam speaking in full sentences. Mild hypokalemia on labs has been repleted and repleted magnesium as well.    05/04/22- Patient is a bit uncomfortable this am. She reports worsening diarreah and abd discomfort.  She had 5 loose stools overnight. She has not been able to wean down much from yesterday on O2. We repleted her K but due to GI losses she has hypokalemia still.  Will obtain pharmacy consult to help with ongong electrolyte shift.  Reduced steroids to 60IV once daily, have added TID scheduled tussinex 2.74m to help with diarreah since we think its non-infectious.  CXR done and reviewed this AM , there are diffuse bilateral infiltrates but improved from rior.   05/07/22- patient had several bloody episodes of loose stools. She thinks today this is slightly better. Her inflammatory biomarkers are still elevated. Her overall presentation seems like a UC patient with COVID19 infection, I wonder if she has an undetectable strain with current testing platform.  O2 requirement is unchanged from 2 days ago.  Im considering vascular surgery  consult for thromectomy evaluation since we have not had much more improvemetn over past 48h and unable to use blood thinners any longer due to onging GI bleeds with UC. Fungitell is still inprocess , remainder of infectiuos workup is negative. I have reduced steroids today. To 468msolumedrol  05/08/22-patient is improved on 10L/min [place]. She has thrombectomy planned for today. Will keep solumedrol at 4066mV daily. She reports feeling better and is smiling during my evaluation.  Husband at bedside we reivewed medical plan.    05/09/22- patient is on HFNC post thrombectomy, she had a setback post operatively may be due to physical stress.  She does have atelectasis on cxr and we have initiated metaneb q8h with saline. Additional covid testing in process due to clinical suspicion of false negative.   05/10/22- patient is improved slightly and has been weaned from HFNC from 75% to 65%.  We changed code status to DNR today.   05/11/22- patient is slightly improved with less O2 requirement and weaning in process.  Have reduced steroids to PO prednisone 15m67mlan to continue current plan and start PT.  Plan Reviewed with attending physician.   05/13/22- patient feels slightly improved, plan today to get OOB more, her loose stools have essentially resolved.  No new events.  Her spO2 goal is anything >80%.  CXR repeat today.  Reduced PO prednisone  05/14/22- patient is weaned to 6L/min and is working

## 2022-05-20 NOTE — TOC Progression Note (Signed)
Transition of Care [place]) - Progression Note    Patient Details  Name: [name] MRN: [id number] Date of Birth: 1935-11-30  Transition [facility] Care [facility] CM/SW [place],  Phone Number: 05/20/2022, 2:52 PM  Clinical Narrative:     Per Raquel Sarna with [facility] she has sent in updated clinicals to insurance this morning, pending auth at this time.   Expected Discharge Plan: [place] Barriers to Discharge: Continued Medical Work up  Expected Discharge Plan and Services Expected Discharge Plan: [place] Choice: [place] arrangements for the past 2 months: Single Family Home                                       Social Determinants of Health (SDOH) Interventions    Readmission Risk Interventions     No data to display

## 2022-05-20 NOTE — Progress Notes (Signed)
Physical Therapy Treatment Patient Details Name: [name] MRN: [id number] DOB: 1936/08/21 Today's Date: 05/20/2022   History of Present Illness Pt is 18 YOF admitted for PE, dyspnea, palpitations, s/p thrombectomy. PMH includes: skin CA, sleep apnea, SOB, pneumonia, osteopenia, OA, idiopathic pulmonary fibrosis.    PT Comments    Pt presents to PT in bed and agreeable to participate in therapy services. Pt was pleasant and motivated to participate during the session and put forth good effort throughout. Pt required SBA to perform sup>sit transfer but limited by cardiopulm status. Desaturated to 75% w sup>sit, 6L supplemental O2, further functional mobility deferred at this time. Edu on PLB provided and pt returned to sup and allowed SpO2 to recover. Able to participate in supine therapeutic exercises, but requiring prolonged recovery times for SpO2 to return above 85%. Would benefit from skilled PT at SNF to address above deficits in strength, functional mobility, and activity tolerance to promote optimal return to PLOF.   Recommendations for follow up therapy are one component of a multi-disciplinary discharge planning process, led by the attending physician.  Recommendations may be updated based on patient status, additional functional criteria and insurance authorization.  Follow Up Recommendations  Skilled nursing-short term rehab (<3 hours/day) Can patient physically be transported by private vehicle: No   Assistance Recommended at Discharge Frequent or constant Supervision/Assistance  Patient can return home with the following A little help with walking and/or transfers;A little help with bathing/dressing/bathroom;Assist for transportation;Assistance with cooking/housework   Equipment Recommendations  None recommended by PT    Recommendations for Other Services       Precautions / Restrictions Precautions Precautions: Fall Precaution Comments: SaO2/RR/  HR Restrictions Weight Bearing Restrictions: No     Mobility  Bed Mobility Overal bed mobility: Needs Assistance       Supine to sit: Min assist, needs to use bed rail Sit to supine: Min assist        Transfers                   General transfer comment: unable/unsafe to attempt d/t respiratory concerns    Ambulation/Gait               General Gait Details: unable   Stairs             Wheelchair Mobility    Modified Rankin (Stroke Patients Only)       Balance     Sitting balance-Leahy Scale: Good Sitting balance - Comments: no balance deficits in sitting                                    Cognition Arousal/Alertness: Awake/alert Behavior During Therapy: WFL for tasks assessed/performed Overall Cognitive Status: Within Functional Limits for tasks assessed                                          Exercises Total Joint Exercises Ankle Circles/Pumps: AROM, Both, 10 reps Quad Sets: Strengthening, Both, 10 reps Short Arc Quad: Strengthening, Both, 10 reps Heel Slides: Strengthening, Both, 10 reps General Exercises - Upper Extremity Shoulder Flexion: Strengthening, Both, 10 reps Other Exercises Other Exercises: isometric scapular retractions x10 B Other Exercises: PLB cueing Other Exercises: sat EOB 2 minutes Pt left w bed in chair position, nursing notified    General  Comments        Pertinent Vitals/Pain Pain Assessment Pain Assessment: No/denies pain    Home Living                          Prior Function            PT Goals (current goals can now be found in the care plan section) Acute Rehab PT Goals Patient Stated Goal: get better Time For Goal Achievement: 05/17/22 Potential to Achieve Goals: Fair Progress towards PT goals: Not progressing toward goals - comment (limited by cardiopulm status)    Frequency    Min 2X/week      PT Plan Discharge plan needs to be  updated    Co-evaluation              AM-PAC PT "6 Clicks" Mobility   Outcome Measure  Help needed turning from your back to your side while in a flat bed without using bedrails?: None Help needed moving from lying on your back to sitting on the side of a flat bed without using bedrails?: A Little Help needed moving to and from a bed to a chair (including a wheelchair)?: A Lot Help needed standing up from a chair using your arms (e.g., wheelchair or bedside chair)?: A Lot Help needed to walk in hospital room?: Total Help needed climbing 3-5 steps with a railing? : Total 6 Click Score: 13    End of Session Equipment Utilized During Treatment: Oxygen (6L) Activity Tolerance: Treatment limited secondary to medical complications (Comment) (cardiopulm status limits activity) Patient left: in bed;with call bell/phone within reach;with bed alarm set Nurse Communication: Mobility status, pt left with bed in chair setting, nursing notified PT Visit Diagnosis: Other abnormalities of gait and mobility (R26.89);Muscle weakness (generalized) (M62.81)     Time: 4008-6761 PT Time Calculation (min) (ACUTE ONLY): 51 min  Charges:                        Glenice Laine MPH, SPT 05/20/22, 4:45 PM

## 2022-05-20 NOTE — Progress Notes (Signed)
PROGRESS NOTE    [name]  PJK:[id number] DOB: 1935/10/30 DOA: 05/01/2022 PCP: [name], NP  [id number]  LOS: 19 days   Brief hospital course: No notes on file  Assessment & Plan: [name] is a 86 y.o. female with medical history significant for ILD, recurrent c diff, ulcerative colitis, chronic pain, chronic hypoxic respiratory failure, who presented with dyspnea and palpitations.   Reports a decline of several months with acute worsening the days PTA.  Reports having to increase baseline home o2 of 2-3 liters to 4 liters lately, has had several months of hypoxia, and has worsened over past few weeks to being dyspneic at rest worse with minimal ambulation.    Acute on chronic hypoxemic respiratory failure --recently, pt has been on 4-5L home O2.   --covid and RVP neg.  likely progressive ILD, pulm edema, and small PE contributing.  Questionable PNA.  Started on tx for all 4 etiologies on admission. --O2 requirement improved, to 5L, sating in 90's. --Continue supplemental O2 to keep sats between 88-92%, wean as tolerated --cont chest physiotherapy    ILD Underlying severe ILD that is likely progressing but at least several days of acutely worsening dyspnea.  --started on prednisone 50 mg daily on admission and escalated up to IV solumedrol. --pulm consulted, Dr. Lanney Gins Plan: --cont prednisone, 30 mg today with taper --cont azithromycin, per pulm --Albuterol neb scheduled, per pulm   Bloody Diarrhea, improved H/o UC --C diff neg on presentation.  Diarrhea likely due to abx pt received during the first 2 days, and blood likely due to Eliquis --GI consult with Dr. [name]-- recommends avoid antidiarrheals, follow-up with Dr. [name].I. after hospital discharge.  --hold Eliquis   Acute blood loss anemia --Hgb dropped from 12.5 to 8.7 in 3 days from bloody diarrhea --s/p 1u pRBC for Hgb 7.8 --Monitor Hgb and transfuse to keep Hgb >8   Pulmonary  embolus, acute s/p Mechanical thrombectomy and IVC filter per Dr Jefferey Pica on CT, doubt it's the main driver for her dyspnea.   --hold Eliquis due to bloody diarrhea  AKI --Cr went up to 1.68, from baseline of 0.7 --likely 2/2 to dehydration from diarrhea --improved with IVF   Afib w RVR --s/p amiodarone gtt --currently in NSR    Acute on chronic diastolic CHF --pulm edema seen on CT, elevated BNP --started on IV lasix 40 mg daily, d/c'ed on 9/2 --give prn lasix   CAP, ruled out --ID consulted, and ruled out PNA, abx d/c'ed after 2 days.   History recurrent C diff --has multiple BM's per day, sometimes loose, but pt said that's her baseline --C diff neg on presentation   Chronic pain - home norco prn   Hypokalemia --likely due to diarrhea --monitor and replete PRN   DVT prophylaxis: SCD/Compression stockings Code Status: DNR  Family Communication: husband updated at bedside today Level of care: Progressive Dispo:   The patient is from: home Anticipated d/c is to: SNF rehab Anticipated d/c date is: whenever bed available   Subjective and Interval History:  Pt has been stable on 5L O2 at rest, however, desats with minimum exertion.   Objective: Vitals:   05/20/22 0843 05/20/22 1133 05/20/22 1400 05/20/22 1557  BP: (!) 110/58 108/62 106/68 (!) 107/56  Pulse: 74 87 91 83  Resp: 19 (!) 29 (!) 25 (!) 25  Temp: 97.9 F (36.6 C) 98.1 F (36.7 C)  97.6 F (36.4 C)  TempSrc: Oral Oral  Oral  SpO2: 90% 91% 90% (!) 88%  Weight:      Height:        Intake/Output Summary (Last 24 hours) at 05/20/2022 1902 Last data filed at 05/20/2022 1024 Gross per 24 hour  Intake 120 ml  Output --  Net 120 ml    Filed Weights   05/01/22 0753  Weight: 49.4 kg    Examination:   Constitutional: NAD, AAOx3 HEENT: conjunctivae and lids normal, EOMI CV: No cyanosis.   RESP: increased RR while getting cleaned, on 5L Extremities: No effusions, edema in BLE SKIN: warm,  dry Neuro: II - XII grossly intact.   Psych: Normal mood and affect.  Appropriate judgement and reason   Data Reviewed: I have personally reviewed labs and imaging studies  Time spent: 25 minutes  Enzo Bi, MD [facility] If 7PM-7AM, please contact night-coverage 05/20/2022, 7:02 PM

## 2022-05-20 NOTE — Care Management Important Message (Signed)
Important Message  Patient Details  Name: [name] MRN: [id number] Date of Birth: [birth year]   Medicare Important Message Given:  Yes     Dannette Barbara 05/20/2022, 12:01 PM

## 2022-05-21 DIAGNOSIS — J9601 Acute respiratory failure with hypoxia: Secondary | ICD-10-CM | POA: Diagnosis not present

## 2022-05-21 LAB — BASIC METABOLIC PANEL
Anion gap: 8 (ref 5–15)
BUN: 35 mg/dL — ABNORMAL HIGH (ref 8–23)
CO2: 32 mmol/L (ref 22–32)
Calcium: 8.7 mg/dL — ABNORMAL LOW (ref 8.9–10.3)
Chloride: 102 mmol/L (ref 98–111)
Creatinine, Ser: 0.67 mg/dL (ref 0.44–1.00)
GFR, Estimated: >60 mL/min (ref >60)
Glucose, Bld: 79 mg/dL (ref 70–99)
Potassium: 4.4 mmol/L (ref 3.5–5.1)
Sodium: 142 mmol/L (ref 135–145)

## 2022-05-21 LAB — CBC
HCT: 31.1 % — ABNORMAL LOW (ref 36.0–46.0)
Hemoglobin: 9.6 g/dL — ABNORMAL LOW (ref 12.0–15.0)
MCH: 29.3 pg (ref 26.0–34.0)
MCHC: 30.9 g/dL (ref 30.0–36.0)
MCV: 94.8 fL (ref 80.0–100.0)
Platelets: 255 10*3/uL (ref 150–400)
RBC: 3.28 MIL/uL — ABNORMAL LOW (ref 3.87–5.11)
RDW: 16.4 % — ABNORMAL HIGH (ref 11.5–15.5)
WBC: 12.3 10*3/uL — ABNORMAL HIGH (ref 4.0–10.5)
nRBC: 0 % (ref 0.0–0.2)

## 2022-05-21 LAB — MAGNESIUM: Magnesium: 2.3 mg/dL (ref 1.7–2.4)

## 2022-05-21 MED ORDER — PREDNISONE 50 MG PO TABS
25.0000 mg | ORAL_TABLET | Freq: Every day | ORAL | Status: DC
  Administered 2022-05-21 – 2022-05-23 (×3): 25 mg via ORAL
  Filled 2022-05-21 (×3): qty 1

## 2022-05-21 MED ORDER — LEVALBUTEROL HCL 0.63 MG/3ML IN NEBU: 0.6300 mg | INHALATION_SOLUTION | Freq: Four times a day (QID) | RESPIRATORY_TRACT | Status: DC | PRN

## 2022-05-21 MED ORDER — DOCUSATE SODIUM 100 MG PO CAPS: 100.0000 mg | ORAL_CAPSULE | Freq: Two times a day (BID) | ORAL | Status: DC

## 2022-05-21 MED ORDER — DOCUSATE SODIUM 100 MG PO CAPS
100.0000 mg | ORAL_CAPSULE | Freq: Two times a day (BID) | ORAL | Status: DC | PRN
  Administered 2022-05-23: 100 mg via ORAL
  Filled 2022-05-21: qty 1

## 2022-05-21 NOTE — Evaluation (Signed)
Occupational Therapy Evaluation Patient Details Name: [name] MRN: [id number] DOB: [birth year] Today's Date: 05/21/2022   History of Present Illness Pt is 29 YOF admitted for PE, dyspnea, palpitations, s/p thrombectomy. PMH includes: skin CA, sleep apnea, SOB, pneumonia, osteopenia, OA, idiopathic pulmonary fibrosis.   Clinical Impression   Patient seen for evaluation and agreeable to OT/PT co-treatment to maximize safety and participation. Patient presenting with SOB, decreased endurance, strength, and balance impacting safety and independence in ADLs. At baseline, pt is Mod I with ADLs and receives assistance from husband for IADLs. Pt unable to attempt functional transfers or functional mobility this date 2/2 SpO2 desaturating to 76% with mobility. Required all ADL tasks to be completed at bed level. Patient currently functioning at [facility] A for bed mobility, supervision for log rolling L/R, Max A for peri care, and Mod A for LB dressing. Patient will benefit from acute OT to increase overall independence in the areas of ADLs and functional mobility in order to safely discharge to next venue of care. Upon hospital discharge, recommend STR to maximize pt safety and return to PLOF.      Recommendations for follow up therapy are one component of a multi-disciplinary discharge planning process, led by the attending physician.  Recommendations may be updated based on patient status, additional functional criteria and insurance authorization.   Follow Up Recommendations  Skilled nursing-short term rehab (<3 hours/day)    Assistance Recommended at Discharge Frequent or constant Supervision/Assistance  Patient can return home with the following Assist for transportation;Help with stairs or ramp for entrance;Assistance with cooking/housework;A little help with walking and/or transfers;A little help with bathing/dressing/bathroom    Functional Status Assessment  Patient has had a recent decline  in their functional status and demonstrates the ability to make significant improvements in function in a reasonable and predictable amount of time.  Equipment Recommendations  Other (comment) (defer to next venue of care)    Recommendations for Other Services       Precautions / Restrictions Precautions Precautions: Fall Precaution Comments: SaO2/RR/ HR Restrictions Weight Bearing Restrictions: No      Mobility Bed Mobility Overal bed mobility: Needs Assistance Bed Mobility: Rolling Rolling: Supervision   Supine to sit: Min assist Sit to supine: Min assist   General bed mobility comments: verbal cues for sequencing, hand placement    Transfers                   General transfer comment: unable/unsafe to attempt 2/2 respiratory concerns and desatting with mobility      Balance Overall balance assessment: Needs assistance Sitting-balance support: Feet unsupported, No upper extremity supported Sitting balance-Leahy Scale: Good Sitting balance - Comments: supervision static sitting EOB       Standing balance comment: unable/unsafe to attempt 2/2 respiratory concerns and desatting with mobility                           ADL either performed or assessed with clinical judgement   ADL Overall ADL's : Needs assistance/impaired     Grooming: Wash/dry face;Set up;Bed level               Lower Body Dressing: Bed level;Moderate assistance Lower Body Dressing Details (indicate cue type and reason): to don underwear     Toileting- Clothing Manipulation and Hygiene: Maximal assistance;Bed level Toileting - Clothing Manipulation Details (indicate cue type and reason): for posterior hygiene       General ADL  Comments: ADL performance limited by activity tolerance. Pt very SOB and desatting to 76% on 5L O2 via [place] while sitting EOB, had to return to supine and complete ADL tasks at bed level.     Vision Baseline Vision/History: 1 Wears  glasses Patient Visual Report: No change from baseline       Perception     Praxis      Pertinent Vitals/Pain Pain Assessment Pain Assessment: No/denies pain     Hand Dominance Right   Extremity/Trunk Assessment Upper Extremity Assessment Upper Extremity Assessment: Generalized weakness   Lower Extremity Assessment Lower Extremity Assessment: Generalized weakness       Communication Communication Communication: No difficulties   Cognition Arousal/Alertness: Awake/alert Behavior During Therapy: WFL for tasks assessed/performed Overall Cognitive Status: Within Functional Limits for tasks assessed                                       General Comments  SpO2 desaturaed to 76% on 5L O2, pt returned to supine. O2 titrated to 6L and SpO2 slowly improved to 85%, RN notified.    Exercises Other Exercises Other Exercises: OT provided education re: role of OT, OT POC, post acute recs, sitting up for all meals, EOB/OOB mobility with assistance, home/fall safety, pursed lip breathing.    Shoulder Instructions      Home Living Family/patient expects to be discharged to:: Private residence Living Arrangements: Spouse/significant other;Children Available Help at Discharge: Family;Available 24 hours/day Type of Home: House Home Access: Level entry     Home Layout: One level     Bathroom Shower/Tub: Teacher, early years/pre: Standard     Home Equipment: Conservation officer, nature (2 wheels);Cane - single point;Shower seat   Additional Comments: Uses home O2      Prior Functioning/Environment Prior Level of Function : Needs assist             Mobility Comments: Pt reports she has been unable to walk much lately. Cant remember how long this has been the case. Denies history of falls. ADLs Comments: Pt reports Mod I with ADLs, receives assistance for IADLs. Husband drives and assists with medication management. Pt reports they do not cook or go to  grocery store very often, get take out a lot. Has hired Dance movement psychotherapist.        OT Problem List: Decreased strength;Decreased activity tolerance;Impaired balance (sitting and/or standing)      OT Treatment/Interventions: Self-care/ADL training;Patient/family education;Therapeutic exercise;Balance training;Energy conservation;Therapeutic activities;DME and/or AE instruction    OT Goals(Current goals can be found in the care plan section) Acute Rehab OT Goals Patient Stated Goal: get stronger OT Goal Formulation: With patient Time For Goal Achievement: 06/04/22 Potential to Achieve Goals: Good ADL Goals Pt Will Perform Grooming: sitting;with supervision Pt Will Perform Upper Body Dressing: sitting;with supervision Pt Will Perform Lower Body Dressing: with supervision;sitting/lateral leans Pt Will Transfer to Toilet: stand pivot transfer;bedside commode;with supervision Pt Will Perform Toileting - Clothing Manipulation and hygiene: sit to/from stand;with supervision  OT Frequency: Min 2X/week    Co-evaluation PT/OT/SLP Co-Evaluation/Treatment: Yes Reason for Co-Treatment: Necessary to address cognition/behavior during functional activity;For patient/therapist safety;To address functional/ADL transfers PT goals addressed during session: Mobility/safety with mobility;Proper use of DME OT goals addressed during session: ADL's and self-care;Proper use of Adaptive equipment and DME      AM-PAC OT "6 Clicks" Daily Activity     Outcome Measure Help  from another person eating meals?: None Help from another person taking care of personal grooming?: A Little Help from another person toileting, which includes using toliet, bedpan, or urinal?: A Lot Help from another person bathing (including washing, rinsing, drying)?: A Lot Help from another person to put on and taking off regular upper body clothing?: A Little Help from another person to put on and taking off regular lower body clothing?: A Lot 6  Click Score: 16   End of Session Equipment Utilized During Treatment: Oxygen Nurse Communication: Mobility status  Activity Tolerance: Treatment limited secondary to medical complications (Comment) (limited 2/2 respiratory condition) Patient left: in bed;with call bell/phone within reach;with bed alarm set  OT Visit Diagnosis: Other abnormalities of gait and mobility (R26.89);Muscle weakness (generalized) (M62.81)                Time: 6256-3893 OT Time Calculation (min): 31 min Charges:  OT General Charges $OT Visit: 1 Visit OT Evaluation $OT Eval Low Complexity: 1 Low  [facility] MS, OTR/L ascom [phone]  05/21/22, 5:52 PM

## 2022-05-21 NOTE — NC FL2 (Signed)
[place] LEVEL OF CARE SCREENING TOOL     IDENTIFICATION  Patient Name: [name] Birthdate: 23-Apr-1936 Sex: female Admission Date (Current Location): 05/01/2022  [facility] and [place] Number:  Engineering geologist and Address:  [facility], [address] [place], [place] [address]      Provider Number: [id number]  Attending Physician Name and Address:  Enzo Bi, MD  Relative Name and Phone Number:       Current Level of Care: Hospital Recommended Level of Care: [place] Prior Approval Number:    Date Approved/Denied:   PASRR Number: Manual review  Discharge Plan: SNF    Current Diagnoses: Patient Active Problem List   Diagnosis Date Noted   Acute respiratory failure with hypoxia ([place])    Interstitial lung disease ([place])    Acute pulmonary embolism ([place]) 05/01/2022   Ulcerative colitis ([place]) 02/26/2022   Preventative health care 02/26/2022   Fall (06/28/2021) 07/03/2021   Chronic shoulder pain (Left) 05/17/2021   History of total shoulder replacement (Left) 05/17/2021   Pain in shoulder region after shoulder replacement (Left) 05/17/2021   History of arthroplasty of shoulder (Left) 05/17/2021   Osteoporosis with current pathological fracture, sequela 04/24/2021   Osteoporosis with pathological fracture of lumbar vertebra ([place]) 04/24/2021   Trigger point of shoulder region (Left) 04/24/2021   Tendinitis of triceps (Left) 04/24/2021   Chronic upper back pain 04/24/2021   Chronic thoracic back pain (Left) 04/24/2021   Trigger point with back pain 04/24/2021   Lower extremity weakness (Bilateral) 04/24/2021   Myofascial pain syndrome of lumbar spine 04/24/2021   Myofascial pain syndrome of thoracic spine 04/24/2021   Cervicalgia 04/24/2021   Myalgia, other site 04/24/2021   Chronic radicular pain of lower back 02/13/2021   Lumbar spondylosis 02/13/2021   Lumbosacral facet arthropathy (Multilevel)  (Bilateral) 01/16/2021   Lumbar facet syndrome 01/16/2021   Spondylosis without myelopathy or radiculopathy, lumbosacral region 01/16/2021   DDD (degenerative disc disease), lumbosacral 01/16/2021   DDD (degenerative disc disease), thoracolumbar 01/16/2021   Chronic use of opiate for therapeutic purpose 01/16/2021   Abnormal MRI, lumbar spine (12/06/2020) 01/15/2021   Compression fracture of T12 thoracic vertebra (w/ retropulsion), sequela 01/15/2021   Compression fracture of L1 lumbar vertebra, sequela 01/15/2021   Compression fracture of L3 lumbar vertebra, sequela 01/15/2021   Chronic pain syndrome 01/03/2021   Pharmacologic therapy 01/03/2021   Disorder of skeletal system 01/03/2021   Problems influencing health status 01/03/2021   Chronic groin pain (2ry area of Pain) (Right) 01/03/2021   Chronic low back pain (1ry area of Pain) (Bilateral) (L>R) w/o sciatica 01/03/2021   Chronic hip pain after total replacement (Right) 01/03/2021   Chronic shoulder pain (3ry area of Pain) (Bilateral) (L>R) 01/03/2021   Persistent cough for 3 weeks or longer 12/20/2020   C. 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  Was using this at home.) Incontinent, External catheter Weight: 109 lb (49.4 kg) Height:  4' 10"  (147.3 cm)  BEHAVIORAL SYMPTOMS/MOOD  NEUROLOGICAL BOWEL NUTRITION STATUS   (None)  (None) Incontinent Diet (Regular)  AMBULATORY STATUS COMMUNICATION OF NEEDS Skin   Extensive Assist Verbally Bruising, Other (Comment) (Erythema/redness, rash.)                       Personal Care Assistance Level of Assistance  Bathing, Feeding, Dressing Bathing Assistance: Limited assistance Feeding assistance: Limited assistance Dressing Assistance: Limited assistance     Functional Limitations Info  Sight, Hearing, Speech Sight Info: Adequate Hearing Info: Adequate Speech Info: Adequate    SPECIAL CARE FACTORS FREQUENCY  PT (By licensed PT), OT (By licensed OT)     PT Frequency: 5 x week OT Frequency: 5 x week            Contractures Contractures Info: Not present    Additional Factors Info  Code Status, Allergies Code Status Info: DNR Allergies Info: NKDA           Current Medications (05/21/2022):  This is the current hospital active medication list Current Facility-Administered Medications  Medication Dose Route Frequency Provider Last Rate Last Admin   0.9 %  sodium chloride infusion  250 mL Intravenous PRN Algernon Huxley, MD       acetaminophen (TYLENOL) tablet 650 mg  650 mg Oral Q6H PRN Fritzi Mandes, MD   650 mg at 05/18/22 0212   acidophilus (RISAQUAD) capsule 2 capsule  2 capsule Oral TID Algernon Huxley, MD   2 capsule at 05/21/22 0850   azithromycin (ZITHROMAX) tablet 500 mg  500 mg Oral Daily Ottie Glazier, MD   500 mg at 05/21/22 0850   citalopram (CELEXA) tablet 10 mg  10 mg Oral Daily Algernon Huxley, MD   10 mg at 05/21/22 0850   cyanocobalamin (VITAMIN B12) tablet 1,000 mcg  1,000 mcg Oral Micheline Maze, Gus Height, MD   1,000 mcg at 05/21/22 0850   diclofenac Sodium (VOLTAREN) 1 % topical gel 2 g  2 g Topical TID PRN Fritzi Mandes, MD       docusate sodium (COLACE) capsule 100 mg  100 mg Oral BID PRN Enzo Bi, MD   100 mg at 05/17/22 1420   feeding supplement (BOOST / RESOURCE BREEZE) liquid 1 Container  1  Container Oral TID BM Fritzi Mandes, MD   1 Container at 05/18/22 1624   heparin injection 5,000 Units  5,000 Units Subcutaneous Q8H Ottie Glazier, MD   5,000 Units at 05/21/22 0538   levalbuterol (XOPENEX) nebulizer solution 0.63 mg  0.63 mg Nebulization TID Ottie Glazier, MD   0.63 mg at 05/20/22 1455   ondansetron (ZOFRAN) injection 4 mg  4 mg Intravenous Q6H PRN Algernon Huxley, MD   4 mg at 05/18/22 2055   Oral care mouth rinse  15 mL Mouth Rinse PRN Enzo Bi, MD       polyethylene glycol (MIRALAX / GLYCOLAX) packet 17 g  17 g Oral BID PRN Enzo Bi, MD   17 g at 05/21/22 0118   predniSONE (DELTASONE) tablet 25 mg  25 mg Oral Q breakfast Ottie Glazier, MD   25 mg at 05/21/22 0850   sodium chloride flush (NS) 0.9 % injection 3 mL  3 mL Intravenous Q12H Algernon Huxley, MD   3 mL at 05/21/22 0851   sodium chloride flush (NS) 0.9 % injection 3 mL  3 mL Intravenous PRN Algernon Huxley, MD         Discharge Medications: Please see discharge summary for a list of discharge medications.  Relevant Imaging Results:  Relevant Lab Results:   Additional Information SS#: [id number]  Candie Chroman, LCSW

## 2022-05-21 NOTE — Progress Notes (Signed)
Physical Therapy Treatment Patient Details Name: [name] MRN: [id number] DOB: Sep 01, 1936 Today's Date: 05/21/2022   History of Present Illness Pt is 42 YOF admitted for PE, dyspnea, palpitations, s/p thrombectomy. PMH includes: skin CA, sleep apnea, SOB, pneumonia, osteopenia, OA, idiopathic pulmonary fibrosis.    PT Comments    Pt presents to PT in bed and agreeable to participate in therapy services. Pt was pleasant and motivated to participate during the session and put forth good effort throughout. Able to perform sup<sit w minA and tolerate sitting EOB for 3 minutes demonstrating adequate static sitting balance. Desaturaed to 76% on 5L O2 and returned to supine. Able to tolerate rolling R<-->L for pericare and repositioning. Supplemental O2 titrated to 6L and O2 slowly returned to 85%, nursing notified. Further mobility deferred d/t respiratory concerns at this time. Pt edu on PLB provided. Would benefit from skilled PT at SNF to address above deficits in strength, functional activity, and activity tolerance to promote optimal return to PLOF.    Recommendations for follow up therapy are one component of a multi-disciplinary discharge planning process, led by the attending physician.  Recommendations may be updated based on patient status, additional functional criteria and insurance authorization.  Follow Up Recommendations  Skilled nursing-short term rehab (<3 hours/day) Can patient physically be transported by private vehicle: No   Assistance Recommended at Discharge Frequent or constant Supervision/Assistance  Patient can return home with the following A little help with walking and/or transfers;A little help with bathing/dressing/bathroom;Assist for transportation;Assistance with cooking/housework   Equipment Recommendations  None recommended by PT    Recommendations for Other Services       Precautions / Restrictions Precautions Precautions: Fall Precaution Comments:  SaO2/RR/ HR Restrictions Weight Bearing Restrictions: No     Mobility  Bed Mobility Overal bed mobility: Needs Assistance Bed Mobility: Rolling Rolling: Supervision   Supine to sit: Min assist          Transfers                   General transfer comment: unable/unsafe to attempt d/t respiratory concerns    Ambulation/Gait               General Gait Details: unable   Stairs             Wheelchair Mobility    Modified Rankin (Stroke Patients Only)       Balance Overall balance assessment: Needs assistance Sitting-balance support: Feet unsupported, No upper extremity supported Sitting balance-Leahy Scale: Good Sitting balance - Comments: no balance deficits in sitting                                    Cognition Arousal/Alertness: Awake/alert Behavior During Therapy: WFL for tasks assessed/performed Overall Cognitive Status: Within Functional Limits for tasks assessed                                 General Comments: pt remains A and O but severely limited due to cardio-respiratory respones to minimal activity.        Exercises Other Exercises Other Exercises: PLB edu Other Exercises: sat EOB 3 minutes    General Comments        Pertinent Vitals/Pain Pain Assessment Pain Assessment: No/denies pain    Home Living  Prior Function            PT Goals (current goals can now be found in the care plan section) Acute Rehab PT Goals Patient Stated Goal: get better PT Goal Formulation: With patient Time For Goal Achievement: 05/17/22 Potential to Achieve Goals: Fair Progress towards PT goals: Not progressing toward goals - comment (limited by cardiopulm status)    Frequency    Min 2X/week      PT Plan Current plan remains appropriate    Co-evaluation              AM-PAC PT "6 Clicks" Mobility   Outcome Measure  Help needed turning from your back  to your side while in a flat bed without using bedrails?: None Help needed moving from lying on your back to sitting on the side of a flat bed without using bedrails?: A Little Help needed moving to and from a bed to a chair (including a wheelchair)?: A Lot Help needed standing up from a chair using your arms (e.g., wheelchair or bedside chair)?: A Lot Help needed to walk in hospital room?: Total Help needed climbing 3-5 steps with a railing? : Total 6 Click Score: 13    End of Session Equipment Utilized During Treatment: Oxygen (6L) Activity Tolerance: Treatment limited secondary to medical complications (Comment) (cardiopulm status limits activity) Patient left: in bed;with call bell/phone within reach;with bed alarm set Nurse Communication: Mobility status PT Visit Diagnosis: Other abnormalities of gait and mobility (R26.89);Muscle weakness (generalized) (M62.81)     Time: 5681-2751 PT Time Calculation (min) (ACUTE ONLY): 26 min  Charges:                        Glenice Laine MPH, SPT 05/21/22, 2:41 PM

## 2022-05-21 NOTE — Progress Notes (Signed)
PROGRESS NOTE    [name]  FKC:[id number] DOB: Apr 29, 1936 DOA: 05/01/2022 PCP: [name], NP  [id number]  LOS: 20 days   Brief hospital course: No notes on file  Assessment & Plan: [name] is a 86 y.o. female with medical history significant for ILD, recurrent c diff, ulcerative colitis, chronic pain, chronic hypoxic respiratory failure, who presented with dyspnea and palpitations.   Reports a decline of several months with acute worsening the days PTA.  Reports having to increase baseline home o2 of 2-3 liters to 4 liters lately, has had several months of hypoxia, and has worsened over past few weeks to being dyspneic at rest worse with minimal ambulation.    Acute on chronic hypoxemic respiratory failure --recently, pt has been on 4-5L home O2.   --covid and RVP neg.  likely progressive ILD, pulm edema, and small PE contributing.  Questionable PNA.  Started on tx for all 4 etiologies on admission. --O2 requirement improved, to 5L, sating in 90's. --Continue supplemental O2 to keep sats between 88-92%, wean as tolerated --cont chest physiotherapy    ILD Underlying severe ILD that is likely progressing but at least several days of acutely worsening dyspnea.  --started on prednisone 50 mg daily on admission and escalated up to IV solumedrol. --pulm consulted, Dr. Lanney Gins Plan: --cont prednisone, 25 mg today with taper by 5 mg per day --cont azithromycin for 3 more days, per pulm   Bloody Diarrhea, resolved H/o UC --C diff neg on presentation.  Diarrhea likely due to abx pt received during the first 2 days, and blood likely due to Eliquis --GI consult with Dr. [name]-- recommends avoid antidiarrheals, follow-up with Dr. [name].I. after hospital discharge.  --hold Eliquis   Acute blood loss anemia --Hgb dropped from 12.5 to 8.7 in 3 days from bloody diarrhea --s/p 1u pRBC for Hgb 7.8 --Monitor Hgb and transfuse to keep Hgb >8   Pulmonary embolus,  acute s/p Mechanical thrombectomy and IVC filter on 9/6 with Dr Lucky Cowboy   --hold Eliquis due to bloody diarrhea  AKI, resolved --Cr went up to 1.68, from baseline of 0.7 --likely 2/2 to dehydration from diarrhea --improved with IVF   Afib w RVR --s/p amiodarone gtt --currently in NSR    Acute on chronic diastolic CHF --pulm edema seen on CT, elevated BNP --started on IV lasix 40 mg daily, d/c'ed on 9/2 --give prn lasix   CAP, ruled out --ID consulted, and ruled out PNA, abx d/c'ed after 2 days.   History recurrent C diff --has multiple BM's per day, sometimes loose, but pt said that's her baseline --C diff neg on presentation   Chronic pain - home norco prn   Hypokalemia --likely due to diarrhea --monitor and replete PRN   DVT prophylaxis: SCD/Compression stockings Code Status: DNR  Family Communication: son updated at bedside today Level of care: Progressive Dispo:   The patient is from: home Anticipated d/c is to: SNF rehab Anticipated d/c date is: whenever bed available   Subjective and Interval History:  Pt continued to be stable on 5L O2 while at rest, however, even talking can cause her to desat temporarily.     Objective: Vitals:   05/21/22 0345 05/21/22 0759 05/21/22 1148 05/21/22 1559  BP: 133/71 117/71 121/73 119/74  Pulse: 67 74 87 79  Resp: (!) 24 (!) 23 (!) 25 (!) 22  Temp: 98.2 F (36.8 C) 98 F (36.7 C) (!) 97.5 F (36.4 C) 97.9 F (36.6 C)  TempSrc: Oral Oral Oral Oral  SpO2: 99% 97% 92% 91%  Weight:      Height:       No intake or output data in the 24 hours ending 05/21/22 1635   Filed Weights   05/01/22 0753  Weight: 49.4 kg    Examination:   Constitutional: NAD, AAOx3 HEENT: conjunctivae and lids normal, EOMI CV: No cyanosis.   RESP: normal respiratory effort, on 5L Extremities: No effusions, edema in BLE SKIN: warm, dry Neuro: II - XII grossly intact.   Psych: Normal mood and affect.  Appropriate judgement and  reason   Data Reviewed: I have personally reviewed labs and imaging studies  Time spent: 25 minutes  Enzo Bi, MD [facility] If 7PM-7AM, please contact night-coverage 05/21/2022, 4:35 PM

## 2022-05-21 NOTE — TOC Progression Note (Addendum)
Transition of Care [facility]) - Progression Note    Patient Details  Name: [name] MRN: [id number] Date of Birth: Oct 17, 1935  Transition of Care [facility]) CM/SW [place], LCSW Phone Number: 05/21/2022, 11:38 AM  Clinical Narrative:   Per MD, patient back down to 5 L oxygen and no longer needs LTACH. She discussed SNF with patient and said she was agreeable. Met with patient to confirm. First preference is [facility]. Left message for admissions coordinator to notify. Gave CMS scores for facilities within 25 miles of her zip code.   11:59 am: Per [facility] liaison, [employer] [facility] has issued denial stating that she is stable on 5 L and can go to lower level of care. Peer-to-peer was offered but deadline was noon today.   12:31 pm: Uploaded requested clinicals into [place] [facility] for PASARR review.  12:48 pm: PASARR obtained: [id number].  2:22 pm: [facility] does not have any bed availability. Patient has one bed offer from [facility]. Went by her room to discuss but she was working with PT. Will try again later.  3:05 pm: Reviewed bed offer with patient and husband. They live on the [facility] [address] [facility] side of [place] and would like to see if any of the [facility] with 4 or 5 stars can offer a bed before holding bed at [facility].  Expected Discharge Plan: [place] Barriers to Discharge: Continued Medical Work up  Expected Discharge Plan and Services Expected Discharge Plan: [place] Choice: [place] arrangements for the past 2 months: Single Family Home                                       Social Determinants of Health (SDOH) Interventions    Readmission Risk Interventions     No data to display

## 2022-05-21 NOTE — Progress Notes (Signed)
PULMONOLOGY         Date: 05/21/2022,   MRN# [id number] [name] 04/23/1936     AdmissionWeight: 49.4 kg                 CurrentWeight: 49.4 kg  Referring provider: [name]   CHIEF COMPLAINT:   Acute on chronic hypoxemic respiratory failure   HISTORY OF PRESENT ILLNESS   This is a pleasant 86 yo F with hx of UC, ILD with Pulmonary fibrosis, chronic pain syndrome, who came in due to acute on chronic hypoxemia. She reports increased O2 requirement at home with worsening cough, she denies flu like illness or fevers.  Labwork reveals elevated BNP   05/02/22 - patient is improved clinically, RVP is negative , does not appear to have ongoing infection s/p ID eval with de-escalation of antimicrobials. Placed on eliquis for acute PE with mild clot burden.  CRP and ESR was not collected and I have reordered this. She is nontachypneic during exam speaking in full sentences. Mild hypokalemia on labs has been repleted and repleted magnesium as well.    05/04/22- Patient is a bit uncomfortable this am. She reports worsening diarreah and abd discomfort.  She had 5 loose stools overnight. She has not been able to wean down much from yesterday on O2. We repleted her K but due to GI losses she has hypokalemia still.  Will obtain pharmacy consult to help with ongong electrolyte shift.  Reduced steroids to 60IV once daily, have added TID scheduled tussinex 2.76mL to help with diarreah since we think its non-infectious.  CXR done and reviewed this AM , there are diffuse bilateral infiltrates but improved from rior.   05/07/22- patient had several bloody episodes of loose stools. She thinks today this is slightly better. Her inflammatory biomarkers are still elevated. Her overall presentation seems like a UC patient with COVID19 infection, I wonder if she has an undetectable strain with current testing platform.  O2 requirement is unchanged from 2 days ago.  Im considering vascular surgery  consult for thromectomy evaluation since we have not had much more improvemetn over past 48h and unable to use blood thinners any longer due to onging GI bleeds with UC. Fungitell is still inprocess , remainder of infectiuos workup is negative. I have reduced steroids today. To 40mg  solumedrol  05/21/22- patient resting in bed on 5L/min [place].  I observed her having some belching this am.  She continues to work with PT/OT.  Plan for home dc vs possible [facility]. Blood work with improved leukocytosis but mild worsening of hemoglobin.  BMP with resolution of electrolyte derrangements and renal impairment is also resolved. She may have outpatient follow up with DC home when appropriate from [facility] perspective. Prednisone down to 25mg  this am and she can fiinish zithromax 500 x 3 more days  PAST MEDICAL HISTORY   Past Medical History:  Diagnosis Date   Altered mental state    Anginal pain (HCC)    Anxiety    Basal cell carcinoma 05/03/2021   right neck postauricular - Excised 06/12/21   BMI 30.0-30.9,adult    Cellulitis    Chest pain, atypical    Compression fracture of L4 lumbar vertebra    Constipation    COVID-19 virus infection 05/28/2020   Cystitis    Cystocele    Diverticulosis    Fatigue    Fibrosis, idiopathic pulmonary (HCC)    Gastroenteritis    History of back surgery  History of herniated intervertebral disc    HTN (hypertension)    Hypercholesteremia    Hypocalcemia    Incontinence of urine    OA (osteoarthritis)    shoulder and back   Obesity    Osteopenia    Pneumonia    Shortness of breath    Sleep apnea    Squamous cell carcinoma of skin 07/16/2016   Right upper lateral eyebrow. SCCis.   Squamous cell carcinoma of skin 01/13/2018   Left temporal hairline. WD SCC with superficial infiltration.   Thrush    Vitamin D deficiency      SURGICAL HISTORY   Past Surgical History:  Procedure Laterality Date   BACK SURGERY     CATARACT EXTRACTION W/PHACO Left  04/24/2016   Procedure: CATARACT EXTRACTION PHACO AND INTRAOCULAR LENS PLACEMENT (IOC);  Surgeon: Lockie Mola, MD;  Location: [facility]ERY [facility];  Service: Ophthalmology;  Laterality: Left;   CATARACT EXTRACTION W/PHACO Right 07/14/2017   Procedure: CATARACT EXTRACTION PHACO AND INTRAOCULAR LENS PLACEMENT (IOC)  RIGHT;  Surgeon: Lockie Mola, MD;  Location: [facility];  Service: Ophthalmology;  Laterality: Right;   CHOLECYSTECTOMY     COLONOSCOPY WITH PROPOFOL N/A 02/01/2022   Procedure: COLONOSCOPY WITH PROPOFOL;  Surgeon: Wyline Mood, MD;  Location: [facility] ENDOSCOPY;  Service: Gastroenterology;  Laterality: N/A;   CYSTOSCOPY/URETEROSCOPY/HOLMIUM LASER/STENT PLACEMENT Left 04/27/2021   Procedure: CYSTOSCOPY/URETEROSCOPY/HOLMIUM LASER/STENT PLACEMENT;  Surgeon: Sondra Come, MD;  Location: [facility] ORS;  Service: Urology;  Laterality: Left;   ESOPHAGOGASTRODUODENOSCOPY (EGD) WITH PROPOFOL N/A 10/13/2018   Procedure: ESOPHAGOGASTRODUODENOSCOPY (EGD) WITH PROPOFOL;  Surgeon: Wyline Mood, MD;  Location: [facility] ENDOSCOPY;  Service: Gastroenterology;  Laterality: N/A;   ESOPHAGOGASTRODUODENOSCOPY (EGD) WITH PROPOFOL N/A 11/22/2021   Procedure: ESOPHAGOGASTRODUODENOSCOPY (EGD) WITH PROPOFOL;  Surgeon: Wyline Mood, MD;  Location: [facility] ENDOSCOPY;  Service: Gastroenterology;  Laterality: N/A;   EYE SURGERY     FLEXIBLE SIGMOIDOSCOPY N/A 10/13/2018   Procedure: FLEXIBLE SIGMOIDOSCOPY;  Surgeon: Wyline Mood, MD;  Location: [facility] ENDOSCOPY;  Service: Gastroenterology;  Laterality: N/A;   FLEXIBLE SIGMOIDOSCOPY N/A 09/09/2019   Procedure: FLEXIBLE SIGMOIDOSCOPY;  Surgeon: Wyline Mood, MD;  Location: [facility] ENDOSCOPY;  Service: Gastroenterology;  Laterality: N/A;   FLEXIBLE SIGMOIDOSCOPY N/A 08/04/2020   Procedure: FLEXIBLE SIGMOIDOSCOPY;  Surgeon: Wyline Mood, MD;  Location: [facility] ENDOSCOPY;  Service: Gastroenterology;  Laterality: N/A;  Per Dr. Tobi Bastos, unsedated   FLEXIBLE SIGMOIDOSCOPY N/A  08/31/2021   Procedure: FLEXIBLE SIGMOIDOSCOPY;  Surgeon: Wyline Mood, MD;  Location: [facility] ENDOSCOPY;  Service: Gastroenterology;  Laterality: N/A;  no sedation   HIP ARTHROPLASTY Right 09/27/2020   Procedure: ARTHROPLASTY BIPOLAR HIP (HEMIARTHROPLASTY);  Surgeon: Kennedy Bucker, MD;  Location: [facility] ORS;  Service: Orthopedics;  Laterality: Right;   JOINT REPLACEMENT     PULMONARY THROMBECTOMY N/A 05/08/2022   Procedure: PULMONARY THROMBECTOMY;  Surgeon: Annice Needy, MD;  Location: [facility] INVASIVE CV LAB;  Service: Cardiovascular;  Laterality: N/A;   ROTATOR CUFF REPAIR Bilateral    left-12/2009; right-03/2009 dr.califf   TONSILLECTOMY     TOTAL SHOULDER REPLACEMENT Left      FAMILY HISTORY   Family History  Problem Relation Age of Onset   COPD Mother    Cancer Brother        colon     SOCIAL HISTORY   Social History   Tobacco Use   Smoking status: Never   Smokeless tobacco: Never  Vaping Use   Vaping Use: Never used  Substance Use Topics   Alcohol use: No   Drug use:  No     MEDICATIONS    Home Medication:     Current Medication:  Current Facility-Administered Medications:    0.9 %  sodium chloride infusion, 250 mL, Intravenous, PRN, Wyn Quaker, Marlow Baars, MD   acetaminophen (TYLENOL) tablet 650 mg, 650 mg, Oral, Q6H PRN, Enedina Finner, MD, 650 mg at 05/18/22 0212   acidophilus (RISAQUAD) capsule 2 capsule, 2 capsule, Oral, TID, Wyn Quaker, Marlow Baars, MD, 2 capsule at 05/20/22 2057   azithromycin (ZITHROMAX) tablet 500 mg, 500 mg, Oral, Daily, Karna Christmas, Skyley Grandmaison, MD, 500 mg at 05/20/22 1610   citalopram (CELEXA) tablet 10 mg, 10 mg, Oral, Daily, Dew, Marlow Baars, MD, 10 mg at 05/20/22 9604   cyanocobalamin (VITAMIN B12) tablet 1,000 mcg, 1,000 mcg, Oral, Becky Sax, Jearl Klinefelter, MD, 1,000 mcg at 05/19/22 1026   diclofenac Sodium (VOLTAREN) 1 % topical gel 2 g, 2 g, Topical, TID PRN, Enedina Finner, MD   docusate sodium (COLACE) capsule 100 mg, 100 mg, Oral, BID PRN, Darlin Priestly, MD, 100 mg at 05/17/22  1420   feeding supplement (BOOST / RESOURCE BREEZE) liquid 1 Container, 1 Container, Oral, TID BM, Enedina Finner, MD, 1 Container at 05/18/22 1624   heparin injection 5,000 Units, 5,000 Units, Subcutaneous, Q8H, Vida Rigger, MD, 5,000 Units at 05/21/22 0538   levalbuterol (XOPENEX) nebulizer solution 0.63 mg, 0.63 mg, Nebulization, TID, Karna Christmas, Shaquayla Klimas, MD, 0.63 mg at 05/20/22 1455   ondansetron (ZOFRAN) injection 4 mg, 4 mg, Intravenous, Q6H PRN, Annice Needy, MD, 4 mg at 05/18/22 2055   Oral care mouth rinse, 15 mL, Mouth Rinse, PRN, Darlin Priestly, MD   polyethylene glycol (MIRALAX / GLYCOLAX) packet 17 g, 17 g, Oral, BID PRN, Darlin Priestly, MD, 17 g at 05/21/22 0118   predniSONE (DELTASONE) tablet 30 mg, 30 mg, Oral, Q breakfast, Karna Christmas, Masako Overall, MD, 30 mg at 05/20/22 0837   sodium chloride flush (NS) 0.9 % injection 3 mL, 3 mL, Intravenous, Q12H, Dew, Marlow Baars, MD, 3 mL at 05/20/22 2056   sodium chloride flush (NS) 0.9 % injection 3 mL, 3 mL, Intravenous, PRN, Wyn Quaker, Marlow Baars, MD    ALLERGIES   Patient has no known allergies.     REVIEW OF SYSTEMS    Review of Systems:  Gen:  Denies  fever, sweats, chills weigh loss  HEENT: Denies blurred vision, double vision, ear pain, eye pain, hearing loss, nose bleeds, sore throat Cardiac:  No dizziness, chest pain or heaviness, chest tightness,edema Resp:   reports dyspnea chronically  Gi: Denies swallowing difficulty, stomach pain, nausea or vomiting, diarrhea, constipation, bowel incontinence Gu:  Denies bladder incontinence, burning urine Ext:   Denies Joint pain, stiffness or swelling Skin: Denies  skin rash, easy bruising or bleeding or hives Endoc:  Denies polyuria, polydipsia , polyphagia or weight change Psych:   Denies depression, insomnia or hallucinations   Other:  All other systems negative   VS: BP 133/71 (BP Location: Right Arm)   Pulse 67   Temp 98.2 F (36.8 C) (Oral)   Resp (!) 24   Ht 4\' 10"  (1.473 m)   Wt 49.4 kg    SpO2 99%   BMI 22.78 kg/m      PHYSICAL EXAM    GENERAL:NAD, no fevers, chills, no weakness no fatigue HEAD: Normocephalic, atraumatic.  EYES: Pupils equal, round, reactive to light. Extraocular muscles intact. No scleral icterus.  MOUTH: Moist mucosal membrane. Dentition intact. No abscess noted.  EAR, NOSE, THROAT: Clear without exudates. No external lesions.  NECK:  Supple. No thyromegaly. No nodules. No JVD.  PULMONARY: decreased breath sounds with mild rhonchi worse at bases bilaterally.  CARDIOVASCULAR: S1 and S2. Regular rate and rhythm. No murmurs, rubs, or gallops. No edema. Pedal pulses 2+ bilaterally.  GASTROINTESTINAL: Soft, nontender, nondistended. No masses. Positive bowel sounds. No hepatosplenomegaly.  MUSCULOSKELETAL: No swelling, clubbing, or edema. Range of motion full in all extremities.  NEUROLOGIC: Cranial nerves II through XII are intact. No gross focal neurological deficits. Sensation intact. Reflexes intact.  SKIN: No ulceration, lesions, rashes, or cyanosis. Skin warm and dry. Turgor intact.  PSYCHIATRIC: Mood, affect within normal limits. The patient is awake, alert and oriented x 3. Insight, judgment intact.       IMAGING         ASSESSMENT/PLAN   Acute on chronic hypoxemic respiratory failure    - abnormal CT chest with interval development of inflammatory changes as evidenced with serial CT chest above   - possible overlying interstitial edema   - RVP is negative and remainder of micro is negative   - ESR/CRP trending  -currently on prednisone 40mg   - the situation is very difficult because she benefits from steroids with ulcerative colitis and ILD but it potentiates infection including Cdiff so therapy is further complicated   - stopping diuresis due to GI losses   - colitis seems to be mostly inflammatory , appreciate ID evaluation     Pulmonary fibrosis - chronic   - on esbriet - please continue as current  -PT /OT as able              Thank you for allowing me to participate in the care of this patient.   Patient/Family are satisfied with care plan and all questions have been answered.    Provider disclosure: Patient with at least one acute or chronic illness or injury that poses a threat to life or bodily function and is being managed actively during this encounter.  All of the below services have been performed independently by signing provider:  review of prior documentation from internal and or external health records.  Review of previous and current lab results.  Interview and comprehensive assessment during patient visit today. Review of current and previous chest radiographs/CT scans. Discussion of management and test interpretation with health care team and patient/family.   This document was prepared using Dragon voice recognition software and may include unintentional dictation errors.     Vida Rigger, M.D.  Division of Pulmonary & Critical Care Medicine

## 2022-05-21 NOTE — TOC CM/SW Note (Signed)
RE: [name] Date of Birth: 1936/08/31 Date: 05/21/2022   To Whom It May Concern:  Please be advised that the above-named patient will require a short-term nursing home stay - anticipated 30 days or less for rehabilitation and strengthening.  The plan is for return home.

## 2022-05-22 ENCOUNTER — Telehealth: Payer: Self-pay

## 2022-05-22 ENCOUNTER — Telehealth: Payer: Self-pay | Admitting: Primary Care

## 2022-05-22 DIAGNOSIS — J9601 Acute respiratory failure with hypoxia: Secondary | ICD-10-CM | POA: Diagnosis not present

## 2022-05-22 LAB — CBC
HCT: 30.2 % — ABNORMAL LOW (ref 36.0–46.0)
Hemoglobin: 9.5 g/dL — ABNORMAL LOW (ref 12.0–15.0)
MCH: 30 pg (ref 26.0–34.0)
MCHC: 31.5 g/dL (ref 30.0–36.0)
MCV: 95.3 fL (ref 80.0–100.0)
Platelets: 250 10*3/uL (ref 150–400)
RBC: 3.17 MIL/uL — ABNORMAL LOW (ref 3.87–5.11)
RDW: 16.5 % — ABNORMAL HIGH (ref 11.5–15.5)
WBC: 12.2 10*3/uL — ABNORMAL HIGH (ref 4.0–10.5)
nRBC: 0 % (ref 0.0–0.2)

## 2022-05-22 LAB — BASIC METABOLIC PANEL
Anion gap: 4 — ABNORMAL LOW (ref 5–15)
BUN: 30 mg/dL — ABNORMAL HIGH (ref 8–23)
CO2: 30 mmol/L (ref 22–32)
Calcium: 8.8 mg/dL — ABNORMAL LOW (ref 8.9–10.3)
Chloride: 105 mmol/L (ref 98–111)
Creatinine, Ser: 0.64 mg/dL (ref 0.44–1.00)
GFR, Estimated: >60 mL/min (ref >60)
Glucose, Bld: 82 mg/dL (ref 70–99)
Potassium: 4.3 mmol/L (ref 3.5–5.1)
Sodium: 139 mmol/L (ref 135–145)

## 2022-05-22 LAB — MAGNESIUM: Magnesium: 2 mg/dL (ref 1.7–2.4)

## 2022-05-22 MED ORDER — HYDROCODONE-ACETAMINOPHEN 5-325 MG PO TABS: 1.0000 | ORAL_TABLET | Freq: Three times a day (TID) | ORAL | Status: DC | PRN

## 2022-05-22 NOTE — Progress Notes (Signed)
Physical Therapy Treatment Patient Details Name: [name] MRN: [id number] DOB: 07-08-1936 Today's Date: 05/22/2022   History of Present Illness Pt is 83 YOF admitted for PE, dyspnea, palpitations, s/p thrombectomy. PMH includes: skin CA, sleep apnea, SOB, pneumonia, osteopenia, OA, idiopathic pulmonary fibrosis.    PT Comments    Pt presents to PT in bed and agreeable to participate in therapy services. Pt was pleasant and motivated to participate during the session and put forth good effort throughout. Pt found w SpO2 in high 80s on 5L O2. During bed mobility, Pt's SpO2 decr to 65% on 5L O2 while seated EOB, nursing and MD notified. Pt returned to supine. After sufficient rest w SpO2 incr to 85%, and at MD and Pt request, Pt transferred to recliner. During transfer, Pt's SpO2 again dropped to 69% on 5L, nursing and MD again notified. Pt left in chair w SpO2 at 82% on 5L. Per conversation w MD, Pt likely to transition care to hospice services d/t impaired cardiopulmonary status. Pt is medically unable to participate in skilled PT services d/t significant desaturation with mobilization. Per MD, Pt is d/c from acute PT services at this time. Please re-consult as appropriate.   Recommendations for follow up therapy are one component of a multi-disciplinary discharge planning process, led by the attending physician.  Recommendations may be updated based on patient status, additional functional criteria and insurance authorization.  Follow Up Recommendations  No PT follow up Can patient physically be transported by private vehicle: No   Assistance Recommended at Discharge Frequent or constant Supervision/Assistance  Patient can return home with the following A little help with walking and/or transfers;A little help with bathing/dressing/bathroom;Assist for transportation;Assistance with cooking/housework   Equipment Recommendations  None recommended by PT    Recommendations for Other  Services       Precautions / Restrictions Precautions Precautions: Fall Precaution Comments: SaO2/RR/ HR Restrictions Weight Bearing Restrictions: No     Mobility  Bed Mobility Overal bed mobility: Needs Assistance       Supine to sit: Min assist Sit to supine: Min assist   General bed mobility comments: verbal cues for sequencing, hand placement    Transfers Overall transfer level: Needs assistance Equipment used: Rolling walker (2 wheels) Transfers: Bed to chair/wheelchair/BSC Sit to Stand: Min assist Stand pivot transfers: Min assist, +2 safety/equipment         General transfer comment: her absolute maximum effort d/t respiratory limitations and desaturating w mobility    Ambulation/Gait               General Gait Details: unable   Stairs             Wheelchair Mobility    Modified Rankin (Stroke Patients Only)       Balance Overall balance assessment: Needs assistance Sitting-balance support: Feet unsupported, No upper extremity supported Sitting balance-Leahy Scale: Good Sitting balance - Comments: supervision static sitting EOB   Standing balance support: During functional activity, Bilateral upper extremity supported Standing balance-Leahy Scale: Fair                              Cognition Arousal/Alertness: Awake/alert Behavior During Therapy: WFL for tasks assessed/performed Overall Cognitive Status: Within Functional Limits for tasks assessed                                 General  Comments: pt remains A and O but severely limited due to cardio-respiratory respones to minimal activity.        Exercises      General Comments        Pertinent Vitals/Pain Pain Assessment Pain Assessment: No/denies pain    Home Living                          Prior Function            PT Goals (current goals can now be found in the care plan section) Acute Rehab PT Goals Patient Stated  Goal: get better PT Goal Formulation: With patient Time For Goal Achievement: 05/31/22 Potential to Achieve Goals: Fair Progress towards PT goals: Progressing toward goals (limited by cardiopulm status)    Frequency    Other (Comment) (d/c from rehab services)      PT Plan Discharge plan needs to be updated    Co-evaluation              AM-PAC PT "6 Clicks" Mobility   Outcome Measure  Help needed turning from your back to your side while in a flat bed without using bedrails?: None Help needed moving from lying on your back to sitting on the side of a flat bed without using bedrails?: A Little Help needed moving to and from a bed to a chair (including a wheelchair)?: A Lot Help needed standing up from a chair using your arms (e.g., wheelchair or bedside chair)?: A Lot Help needed to walk in hospital room?: Total Help needed climbing 3-5 steps with a railing? : Total 6 Click Score: 13    End of Session Equipment Utilized During Treatment: Oxygen;Gait belt (5L) Activity Tolerance: Treatment limited secondary to medical complications (Comment) (cardiopulm status limits mobility) Patient left: in chair;with call bell/phone within reach;with chair alarm set;with family/visitor present Nurse Communication: Mobility status PT Visit Diagnosis: Other abnormalities of gait and mobility (R26.89);Muscle weakness (generalized) (M62.81)     Time: 1610-9604 PT Time Calculation (min) (ACUTE ONLY): 36 min  Charges:                        Glenice Laine MPH, SPT 05/22/22, 1:51 PM  05/22/2022, 1:51 PM

## 2022-05-22 NOTE — Progress Notes (Signed)
PULMONOLOGY         Date: 05/22/2022,   MRN# [id number] [name] Oct 04, 1935     AdmissionWeight: 49.4 kg                 CurrentWeight: 49.4 kg  Referring provider: [name]   CHIEF COMPLAINT:   Acute on chronic hypoxemic respiratory failure   HISTORY OF PRESENT ILLNESS   This is a pleasant 86 yo F with hx of UC, ILD with Pulmonary fibrosis, chronic pain syndrome, who came in due to acute on chronic hypoxemia. She reports increased O2 requirement at home with worsening cough, she denies flu like illness or fevers.  Labwork reveals elevated BNP  05/22/22- patient wants to continue her care on hospice now. We discussed her hospital course and current conditions and we all feel hospice is appropriate.   PAST MEDICAL HISTORY   Past Medical History:  Diagnosis Date   Altered mental state    Anginal pain (HCC)    Anxiety    Basal cell carcinoma 05/03/2021   right neck postauricular - Excised 06/12/21   BMI 30.0-30.9,adult    Cellulitis    Chest pain, atypical    Compression fracture of L4 lumbar vertebra    Constipation    COVID-19 virus infection 05/28/2020   Cystitis    Cystocele    Diverticulosis    Fatigue    Fibrosis, idiopathic pulmonary (HCC)    Gastroenteritis    History of back surgery    History of herniated intervertebral disc    HTN (hypertension)    Hypercholesteremia    Hypocalcemia    Incontinence of urine    OA (osteoarthritis)    shoulder and back   Obesity    Osteopenia    Pneumonia    Shortness of breath    Sleep apnea    Squamous cell carcinoma of skin 07/16/2016   Right upper lateral eyebrow. SCCis.   Squamous cell carcinoma of skin 01/13/2018   Left temporal hairline. WD SCC with superficial infiltration.   Thrush    Vitamin D deficiency      SURGICAL HISTORY   Past Surgical History:  Procedure Laterality Date   BACK SURGERY     CATARACT EXTRACTION W/PHACO Left 04/24/2016   Procedure: CATARACT EXTRACTION PHACO  AND INTRAOCULAR LENS PLACEMENT (IOC);  Surgeon: Lockie Mola, MD;  Location[facility];  Service: Ophthalmology;  Laterality: Left;   CATARACT EXTRACTION W/PHACO Right 07/14/2017   Procedure: CATARACT EXTRACTION PHACO AND INTRAOCULAR LENS PLACEMENT (IOC)  RIGHT;  Surgeon: Lockie Mola, MD;  Location: [facility];  Service: Ophthalmology;  Laterality: Right;   CHOLECYSTECTOMY     COLONOSCOPY WITH PROPOFOL N/A 02/01/2022   Procedure: COLONOSCOPY WITH PROPOFOL;  Surgeon: Wyline Mood, MD;  Location: [facility] ENDOSCOPY;  Service: Gastroenterology;  Laterality: N/A;   CYSTOSCOPY/URETEROSCOPY/HOLMIUM LASER/STENT PLACEMENT Left 04/27/2021   Procedure: CYSTOSCOPY/URETEROSCOPY/HOLMIUM LASER/STENT PLACEMENT;  Surgeon: Sondra Come, MD;  Location: [facility] ORS;  Service: Urology;  Laterality: Left;   ESOPHAGOGASTRODUODENOSCOPY (EGD) WITH PROPOFOL N/A 10/13/2018   Procedure: ESOPHAGOGASTRODUODENOSCOPY (EGD) WITH PROPOFOL;  Surgeon: Wyline Mood, MD;  Location: [facility] ENDOSCOPY;  Service: Gastroenterology;  Laterality: N/A;   ESOPHAGOGASTRODUODENOSCOPY (EGD) WITH PROPOFOL N/A 11/22/2021   Procedure: ESOPHAGOGASTRODUODENOSCOPY (EGD) WITH PROPOFOL;  Surgeon: Wyline Mood, MD;  Location: [facility] ENDOSCOPY;  Service: Gastroenterology;  Laterality: N/A;   EYE SURGERY     FLEXIBLE SIGMOIDOSCOPY N/A 10/13/2018   Procedure: FLEXIBLE SIGMOIDOSCOPY;  Surgeon: Wyline Mood, MD;  Location:  [facility] ENDOSCOPY;  Service: Gastroenterology;  Laterality: N/A;   FLEXIBLE SIGMOIDOSCOPY N/A 09/09/2019   Procedure: FLEXIBLE SIGMOIDOSCOPY;  Surgeon: Wyline Mood, MD;  Location: [facility] ENDOSCOPY;  Service: Gastroenterology;  Laterality: N/A;   FLEXIBLE SIGMOIDOSCOPY N/A 08/04/2020   Procedure: FLEXIBLE SIGMOIDOSCOPY;  Surgeon: Wyline Mood, MD;  Location: [facility] ENDOSCOPY;  Service: Gastroenterology;  Laterality: N/A;  Per Dr. Tobi Bastos, unsedated   FLEXIBLE SIGMOIDOSCOPY N/A 08/31/2021   Procedure: FLEXIBLE SIGMOIDOSCOPY;   Surgeon: Wyline Mood, MD;  Location: [facility] ENDOSCOPY;  Service: Gastroenterology;  Laterality: N/A;  no sedation   HIP ARTHROPLASTY Right 09/27/2020   Procedure: ARTHROPLASTY BIPOLAR HIP (HEMIARTHROPLASTY);  Surgeon: Kennedy Bucker, MD;  Location: [facility] ORS;  Service: Orthopedics;  Laterality: Right;   JOINT REPLACEMENT     PULMONARY THROMBECTOMY N/A 05/08/2022   Procedure: PULMONARY THROMBECTOMY;  Surgeon: Annice Needy, MD;  Location: [facility] INVASIVE CV LAB;  Service: Cardiovascular;  Laterality: N/A;   ROTATOR CUFF REPAIR Bilateral    left-12/2009; right-03/2009 dr.califf   TONSILLECTOMY     TOTAL SHOULDER REPLACEMENT Left      FAMILY HISTORY   Family History  Problem Relation Age of Onset   COPD Mother    Cancer Brother        colon     SOCIAL HISTORY   Social History   Tobacco Use   Smoking status: Never   Smokeless tobacco: Never  Vaping Use   Vaping Use: Never used  Substance Use Topics   Alcohol use: No   Drug use: No     MEDICATIONS    Home Medication:     Current Medication:  Current Facility-Administered Medications:    0.9 %  sodium chloride infusion, 250 mL, Intravenous, PRN, Wyn Quaker, Marlow Baars, MD   acetaminophen (TYLENOL) tablet 650 mg, 650 mg, Oral, Q6H PRN, Enedina Finner, MD, 650 mg at 05/22/22 0352   acidophilus (RISAQUAD) capsule 2 capsule, 2 capsule, Oral, TID, Dew, Marlow Baars, MD, 2 capsule at 05/21/22 2048   azithromycin (ZITHROMAX) tablet 500 mg, 500 mg, Oral, Daily, Karna Christmas, Arwen Haseley, MD, 500 mg at 05/21/22 0850   citalopram (CELEXA) tablet 10 mg, 10 mg, Oral, Daily, Dew, Marlow Baars, MD, 10 mg at 05/21/22 0850   cyanocobalamin (VITAMIN B12) tablet 1,000 mcg, 1,000 mcg, Oral, Becky Sax, Sona, MD, 1,000 mcg at 05/21/22 0850   diclofenac Sodium (VOLTAREN) 1 % topical gel 2 g, 2 g, Topical, TID PRN, Enedina Finner, MD   docusate sodium (COLACE) capsule 100 mg, 100 mg, Oral, BID PRN, Darlin Priestly, MD   feeding supplement (BOOST / RESOURCE BREEZE) liquid 1 Container, 1  Container, Oral, TID BM, Enedina Finner, MD, 1 Container at 05/18/22 1624   heparin injection 5,000 Units, 5,000 Units, Subcutaneous, Q8H, Ahman Dugdale, MD, 5,000 Units at 05/22/22 0557   levalbuterol (XOPENEX) nebulizer solution 0.63 mg, 0.63 mg, Nebulization, Q6H PRN, Darlin Priestly, MD   ondansetron [facility]) injection 4 mg, 4 mg, Intravenous, Q6H PRN, Annice Needy, MD, 4 mg at 05/18/22 2055   Oral care mouth rinse, 15 mL, Mouth Rinse, PRN, Darlin Priestly, MD   polyethylene glycol (MIRALAX / GLYCOLAX) packet 17 g, 17 g, Oral, BID PRN, Darlin Priestly, MD, 17 g at 05/21/22 0118   predniSONE (DELTASONE) tablet 25 mg, 25 mg, Oral, Q breakfast, Evelyne Makepeace, MD, 25 mg at 05/21/22 0850   sodium chloride flush (NS) 0.9 % injection 3 mL, 3 mL, Intravenous, Q12H, Dew, Marlow Baars, MD, 3 mL at 05/21/22 2104   sodium chloride flush (NS)  0.9 % injection 3 mL, 3 mL, Intravenous, PRN, Dew, Marlow Baars, MD    ALLERGIES   Patient has no known allergies.     REVIEW OF SYSTEMS    Review of Systems:  Gen:  Denies  fever, sweats, chills weigh loss  HEENT: Denies blurred vision, double vision, ear pain, eye pain, hearing loss, nose bleeds, sore throat Cardiac:  No dizziness, chest pain or heaviness, chest tightness,edema Resp:   reports dyspnea chronically  Gi: Denies swallowing difficulty, stomach pain, nausea or vomiting, diarrhea, constipation, bowel incontinence Gu:  Denies bladder incontinence, burning urine Ext:   Denies Joint pain, stiffness or swelling Skin: Denies  skin rash, easy bruising or bleeding or hives Endoc:  Denies polyuria, polydipsia , polyphagia or weight change Psych:   Denies depression, insomnia or hallucinations   Other:  All other systems negative   VS: BP 130/74 (BP Location: Right Arm)   Pulse 68   Temp 98.7 F (37.1 C) (Oral)   Resp (!) 22   Ht 4\' 10"  (1.473 m)   Wt 49.4 kg   SpO2 95%   BMI 22.78 kg/m      PHYSICAL EXAM    GENERAL:NAD, no fevers, chills, no weakness no  fatigue HEAD: Normocephalic, atraumatic.  EYES: Pupils equal, round, reactive to light. Extraocular muscles intact. No scleral icterus.  MOUTH: Moist mucosal membrane. Dentition intact. No abscess noted.  EAR, NOSE, THROAT: Clear without exudates. No external lesions.  NECK: Supple. No thyromegaly. No nodules. No JVD.  PULMONARY: decreased breath sounds with mild rhonchi worse at bases bilaterally.  CARDIOVASCULAR: S1 and S2. Regular rate and rhythm. No murmurs, rubs, or gallops. No edema. Pedal pulses 2+ bilaterally.  GASTROINTESTINAL: Soft, nontender, nondistended. No masses. Positive bowel sounds. No hepatosplenomegaly.  MUSCULOSKELETAL: No swelling, clubbing, or edema. Range of motion full in all extremities.  NEUROLOGIC: Cranial nerves II through XII are intact. No gross focal neurological deficits. Sensation intact. Reflexes intact.  SKIN: No ulceration, lesions, rashes, or cyanosis. Skin warm and dry. Turgor intact.  PSYCHIATRIC: Mood, affect within normal limits. The patient is awake, alert and oriented x 3. Insight, judgment intact.       IMAGING         ASSESSMENT/PLAN   Acute on chronic hypoxemic respiratory failure    - abnormal CT chest with interval development of inflammatory changes as evidenced with serial CT chest above   - possible overlying interstitial edema   - RVP is negative and remainder of micro is negative   - ESR/CRP trending  -currently on prednisone 40mg   - the situation is very difficult because she benefits from steroids with ulcerative colitis and ILD but it potentiates infection including Cdiff so therapy is further complicated   - stopping diuresis due to GI losses   - colitis seems to be mostly inflammatory , appreciate ID evaluation     Pulmonary fibrosis - chronic   - on esbriet - please continue as current  -PT /OT as able             Thank you for allowing me to participate in the care of this patient.   Patient/Family are  satisfied with care plan and all questions have been answered.    Provider disclosure: Patient with at least one acute or chronic illness or injury that poses a threat to life or bodily function and is being managed actively during this encounter.  All of the below services have been performed independently by signing provider:  review of prior documentation from internal and or external health records.  Review of previous and current lab results.  Interview and comprehensive assessment during patient visit today. Review of current and previous chest radiographs/CT scans. Discussion of management and test interpretation with health care team and patient/family.   This document was prepared using Dragon voice recognition software and may include unintentional dictation errors.     Vida Rigger, M.D.  Division of Pulmonary & Critical Care Medicine

## 2022-05-22 NOTE — Chronic Care Management (AMB) (Unsigned)
    Chronic Care Management Pharmacy Assistant   Name: KYE SILVERSTEIN  MRN: [id number] DOB: [birth year]  Reason for Encounter: General Adherence    Attempted contact with [name] 3 times on 05/24/22,05/27/22,05/28/22. Unsuccessful outreach. Will attempt contact next month.   Hospital visits:  Medication Reconciliation was completed by comparing discharge summary, patient's EMR and Pharmacy list, and upon discussion with patient.  Admitted to the hospital on 05/01/22 due to Acute Respiratory Failure. Discharge date was 05/23/22. Discharged from [facility].    New?Medications Started at [facility] Discharge:?? -started levalbuterol  Morphine  Prednisone  Medications Discontinued at Hospital Discharge: -Stopped prolia  Entyvio  Esbriet  Hydrocodone  Medications that remain the same after Hospital Discharge:??  -All other medications will remain the same.    Medications: Facility-Administered Encounter Medications as of 05/22/2022  Medication   0.9 %  sodium chloride infusion   acetaminophen (TYLENOL) tablet 650 mg   acidophilus (RISAQUAD) capsule 2 capsule   citalopram (CELEXA) tablet 10 mg   cyanocobalamin (VITAMIN B12) tablet 1,000 mcg   diclofenac Sodium (VOLTAREN) 1 % topical gel 2 g   docusate sodium (COLACE) capsule 100 mg   feeding supplement (BOOST / RESOURCE BREEZE) liquid 1 Container   heparin injection 5,000 Units   levalbuterol (XOPENEX) nebulizer solution 0.63 mg   ondansetron (ZOFRAN) injection 4 mg   Oral care mouth rinse   polyethylene glycol (MIRALAX / GLYCOLAX) packet 17 g   predniSONE (DELTASONE) tablet 25 mg   sodium chloride flush (NS) 0.9 % injection 3 mL   sodium chloride flush (NS) 0.9 % injection 3 mL   Outpatient Encounter Medications as of 05/22/2022  Medication Sig   Biotin 10 MG CAPS Take 1 capsule by mouth daily.   citalopram (CELEXA) 40 MG tablet Take 40 mg by mouth daily.   denosumab (PROLIA) 60 MG/ML SOSY injection Inject 60 mg  into the skin every 6 (six) months.   ENTYVIO 300 MG injection Inject 300 mg into the vein every 14 (fourteen) days.   ESBRIET 267 MG TABS Take 267 mg by mouth 3 (three) times daily with meals.   HYDROcodone-acetaminophen (NORCO/VICODIN) 5-325 MG tablet Take 1 tablet by mouth 3 (three) times daily as needed.   Magnesium 500 MG CAPS Take 1 capsule by mouth as needed.   omeprazole (PRILOSEC) 40 MG capsule Take 1 capsule (40 mg total) by mouth daily.   OXYGEN Inhale 4 L into the lungs at bedtime.   vitamin B-12 (CYANOCOBALAMIN) 1000 MCG tablet Take 1,000 mcg by mouth every other day.   VOLTAREN 1 % GEL Apply 2 g topically in the morning, at noon, and at bedtime. Shoulders and back      Star Medications: Medication Name/mg Last Fill Days Supply No star medications identified   Summary of recommendations from last CCM Pharmacy visit (Date:11/09/21) Summary: CCM F/U visit -Pt was approved for Esbriet PAP through pulmonary office, she is appreciative of help with application -Pt has just completed fecal transplant with GI and is not sure it is helping   Recommendations/Changes made from today's visit: -No med changes  Upcoming appointments: No appointments scheduled within the next 30 days.  Charlene Brooke, CPP notified  Avel Sensor, [place]  [phone]

## 2022-05-22 NOTE — Progress Notes (Signed)
[facility]) Hospital Liaison Note   Received request from MD/S. Patel for hospice services at home after discharge. Chart and patient information under review by [facility] physician. Hospice eligibility approved.   Spoke with patient & spouse to initiate education related to hospice philosophy, services, and team approach to care. Both verbalized understanding of information given. Per discussion, the plan is for patient to discharge home via [facility] once cleared to DC.    DME needs discussed. Patient has the following equipment in the home (Purchased privately): O2--[employer] wheelchair Patient requests the following equipment for delivery: hospital bed  bedside table  bedside commode   Address verified and is correct in the chart. [name] is the family member to contact to arrange time of equipment delivery.    Please send signed and completed DNR home with patient/family. Please provide prescriptions at discharge as needed to ensure ongoing symptom management.    [facility] information and contact numbers given to family & above information shared with TOC.   Please call with any questions/concerns.    Thank you for the opportunity to participate in this patient's care.   Daphene Calamity, MSW [facility] Liaison  [phone]

## 2022-05-22 NOTE — TOC Progression Note (Addendum)
Transition of Care [facility]) - Progression Note    Patient Details  Name: [name] MRN: [id number] Date of Birth: 06-Jul-1936  Transition of [facility]) CM/SW [place], LCSW Phone Number: 05/22/2022, 8:53 AM  Clinical Narrative:   No new bed offers this morning. Sent updated therapy notes to facilities that have not responded yet.  2:01 pm: Plan for home with hospice through [facility]. Liaison is ordering DME. Spoke with patient and husband about car vs EMS transport. They both agree that EMS would be safest option and are aware of potential bill they may receive. Wife verified address on facesheet is correct. Husband requesting discharge Friday but CSW explained that insurance will not pay for her to be here if everything is prepared for her discharge.  Expected Discharge Plan: [place] Barriers to Discharge: Continued Medical Work up  Expected Discharge Plan and Services Expected Discharge Plan: [place] Choice: [place] arrangements for the past 2 months: Single Family Home                                       Social Determinants of Health (SDOH) Interventions    Readmission Risk Interventions     No data to display

## 2022-05-22 NOTE — Telephone Encounter (Signed)
Family has requested for [name] to be hospice attending provider,would like to know if she will sign comfort care orders,or would prefer for hospice physician to sign orders,and to state if patient has life expectancy of 6 months or less if illness runs its normal course?

## 2022-05-22 NOTE — Progress Notes (Signed)
[facility] [place] NAME: [name]    MR#:  [id number]  DATE OF BIRTH:  1936-05-09  SUBJECTIVE:  patient gets desaturated very quickly even on sitting up in the bed dips down into the 60s when PT tried to work with her. Takes her longer trying to recover. Very deconditioned. Husband at bedside. PT work with her second time and managed to get her to the recliner. Patient tells me she has not been out of bed for last one week. She has been doing bed exercises due to de- saturations.   VITALS:  Blood pressure 119/65, pulse 83, temperature 98 F (36.7 C), temperature source Axillary, resp. rate 19, height 4' 10"  (1.473 m), weight 49.4 kg, SpO2 90 %.  PHYSICAL EXAMINATION:   GENERAL:  86 y.o.-year-old patient lying in the bed with chronic respiratory distress. Frail, chronically ill LUNGS: shallow breathing  CARDIOVASCULAR: S1, S2 normal tachycardia EXTREMITIES: No  edema b/l.    NEUROLOGIC: nonfocal  patient is alert and awake SKIN: No obvious rash, lesion, or ulcer.   LABORATORY PANEL:  CBC Recent Labs  Lab 05/22/22 0537  WBC 12.2*  HGB 9.5*  HCT 30.2*  PLT 250    Chemistries  Recent Labs  Lab 05/22/22 0537  NA 139  K 4.3  CL 105  CO2 30  GLUCOSE 82  BUN 30*  CREATININE 0.64  CALCIUM 8.8*  MG 2.0    Assessment and Plan [name] is a 86 y.o. female with medical history significant for ILD, recurrent c diff, ulcerative colitis, chronic pain, chronic hypoxic respiratory failure, who presented with dyspnea and palpitations.   Reports a decline of several months with acute worsening the days PTA.  Reports having to increase baseline home o2 of 2-3 liters to 4 liters lately, has had several months of hypoxia, and has worsened over past few weeks to being dyspneic at rest worse with minimal ambulation.    Acute on chronic hypoxemic respiratory failure --recently, pt has been on 4-5L home O2.   --covid and RVP  neg.  likely progressive ILD, pulm edema, and small PE contributing.  Questionable PNA.  Started on tx for all 4 etiologies on admission. --O2 requirement down  to 5L, sating in 90's. Patient desaturated's very easily even on trying to set up in the bed. Takes longer to recover. Very exhausting follow her. Unable to participate actively with PT due to desaturation's. Patient has not gotten out of bed for last week. --Continue supplemental O2 to keep sats between 88-92%, wean as tolerated --chest physiotherapy    ILD Underlying severe ILD that is likely progressing but at least several days of acutely worsening dyspnea.  --started on prednisone 50 mg daily on admission and escalated up to IV solumedrol. --pulm consulted, Dr. Lanney Gins --cont prednisone, 25 mg today with taper by 5 mg per day -- completed antibiotic course   Bloody Diarrhea, resolved H/o UC --C diff neg on presentation.  Diarrhea likely due to abx pt received during the first 2 days, and blood likely due to Eliquis --GI consult with Dr. [name]-- recommends avoid antidiarrheals, follow-up with Dr. [name].I. after hospital discharge.  --hold Eliquis   Acute blood loss anemia --Hgb dropped from 12.5 to 8.7 in 3 days from bloody diarrhea --s/p 1u pRBC for Hgb 7.8 --Monitor Hgb and transfuse to keep Hgb >8   Pulmonary embolus, acute s/p Mechanical thrombectomy and IVC filter on 9/6 with Dr Lucky Cowboy   --  hold Eliquis due to bloody diarrhea   AKI, resolved --Cr went up to 1.68, from baseline of 0.7 --likely 2/2 to dehydration from diarrhea --improved with IVF   Afib w RVR --s/p amiodarone gtt --currently in NSR    Acute on chronic diastolic CHF --pulm edema seen on CT, elevated BNP --started on IV lasix 40 mg daily, d/c'ed on 9/2 --give prn lasix   CAP, ruled out --ID consulted, and ruled out PNA, abx d/c'ed after 2 days.   History recurrent C diff --has multiple BM's per day, sometimes loose, but pt said that's her  baseline --C diff neg on presentation   Chronic pain - home norco prn   Hypokalemia --likely due to diarrhea --monitor and replete PRN     DVT prophylaxis: SCD/Compression stockings Code Status: DNR  Family Communication: husband updated at bedside today Level of care: Progressive Dispo: home with hospice once equipment is set up  Discussed at length with patient and husband. Dr. Lanney Gins discussed with patient and husband given overall poor prognosis and patient not much able to participate with physical therapy since she desaturated's very promptly deep down in the 37s and 60s takes longer to recover. Patient understands rehab is limited for her due to her oxygen desaturation and given overall multiple medical issues and poor lung condition discussed hospice is an option which they are in agreement with. Hospice liaison to meet with patient and husband to discuss option.      TOTAL TIME TAKING CARE OF THIS PATIENT: 35 minutes.  >50% time spent on counselling and coordination of care  Note: This dictation was prepared with Dragon dictation along with smaller phrase technology. Any transcriptional errors that result from this process are unintentional.  Fritzi Mandes M.D    [facility]   CC: Primary care physician; [name], NP

## 2022-05-22 NOTE — Progress Notes (Signed)
Occupational Therapy Treatment Patient Details Name: [name] MRN: [id number] DOB: 09-Feb-1936 Today's Date: 05/22/2022   History of present illness Pt is 69 YOF admitted for PE, dyspnea, palpitations, s/p thrombectomy. PMH includes: skin CA, sleep apnea, SOB, pneumonia, osteopenia, OA, idiopathic pulmonary fibrosis.   OT comments  Upon entering session, pt sitting up in recliner and agreeable to OT. Pt with soiled underwear requiring Max A to complete posterior hygiene and LB dressing. Pt able to complete lateral leans with supervision while OT assisting with cleaning up pt. Pt's activity tolerance continues to be limited by SOB and SpO2 desaturating with activity (down to 77% on 5L O2, required O2 to be titrated up to 6L). Pt left as received with all needs in reach.  Per conversation with MD, pt likely to transition to hospice services 2/2 impaired cardiopulmonary status. Pt is medically unable to participate in skilled OT services 2/2 significant desaturation with activity during ADL tasks. Per MD, pt is to D/C from acute OT services at this time. Please re-consult as appropriate.   Recommendations for follow up therapy are one component of a multi-disciplinary discharge planning process, led by the attending physician.  Recommendations may be updated based on patient status, additional functional criteria and insurance authorization.    Follow Up Recommendations  No OT follow up    Assistance Recommended at Discharge Frequent or constant Supervision/Assistance  Patient can return home with the following  Assist for transportation;Help with stairs or ramp for entrance;Assistance with cooking/housework;A little help with walking and/or transfers;A little help with bathing/dressing/bathroom   Equipment Recommendations  None recommended by OT    Recommendations for Other Services      Precautions / Restrictions Precautions Precautions: Fall Precaution Comments: SaO2/RR/  HR Restrictions Weight Bearing Restrictions: No       Mobility Bed Mobility               General bed mobility comments: Pt received and left in recliner    Transfers                   General transfer comment: deferred 2/2 SOB and SpO2 desaturating below goal of 85% with just sitting upright in chair     Balance Overall balance assessment: Needs assistance Sitting-balance support: Feet unsupported, No upper extremity supported Sitting balance-Leahy Scale: Good Sitting balance - Comments: supervision static sitting EOB                                   ADL either performed or assessed with clinical judgement   ADL Overall ADL's : Needs assistance/impaired     Grooming: Wash/dry face;Set up;Sitting           Upper Body Dressing : Sitting;Moderate assistance Upper Body Dressing Details (indicate cue type and reason): to don/doff gown Lower Body Dressing: Sitting/lateral leans;Maximal assistance Lower Body Dressing Details (indicate cue type and reason): to don/doff underwear while sitting in recliner, able to weight shift to L/R with supervision     Toileting- Clothing Manipulation and Hygiene: Maximal assistance;Sitting/lateral lean Toileting - Clothing Manipulation Details (indicate cue type and reason): for posterior hygiene       General ADL Comments: Pt required rest breaks in between seated self-care tasks.    Extremity/Trunk Assessment Upper Extremity Assessment Upper Extremity Assessment: Generalized weakness   Lower Extremity Assessment Lower Extremity Assessment: Generalized weakness        Vision Baseline  Vision/History: 1 Wears glasses Patient Visual Report: No change from baseline     Perception     Praxis      Cognition Arousal/Alertness: Awake/alert Behavior During Therapy: WFL for tasks assessed/performed Overall Cognitive Status: Within Functional Limits for tasks assessed                                           Exercises Other Exercises Other Exercises: OT provided education re: energy conservation, pursed lip breathing    Shoulder Instructions       General Comments SpO2 91% on 5L O2 via [place] while semi-reclined in chair, 88% once sitting upright in chair, desaturating to 77% with seated self-care tasks. Titrated O2 up to 6L, required increased time to recover with VC throughout session to complete pursed lip breathing. Left pt with SpO2 >85% on 6L, RN notified.    Pertinent Vitals/ Pain       Pain Assessment Pain Assessment: No/denies pain  Home Living                                          Prior Functioning/Environment              Frequency  Other (comment) (D/C therapy)        Progress Toward Goals  OT Goals(current goals can now be found in the care plan section)  Progress towards OT goals: Not progressing toward goals - comment (pt unable to participate in OT 2/2 cardiopulmonary status)  Acute Rehab OT Goals Patient Stated Goal: get stronger OT Goal Formulation: All assessment and education complete, DC therapy Time For Goal Achievement: 06/04/22 Potential to Achieve Goals: Poor  Plan Discharge plan needs to be updated    Co-evaluation                 AM-PAC OT "6 Clicks" Daily Activity     Outcome Measure   Help from another person eating meals?: None Help from another person taking care of personal grooming?: A Little Help from another person toileting, which includes using toliet, bedpan, or urinal?: A Lot Help from another person bathing (including washing, rinsing, drying)?: A Lot Help from another person to put on and taking off regular upper body clothing?: A Little Help from another person to put on and taking off regular lower body clothing?: A Lot 6 Click Score: 16    End of Session Equipment Utilized During Treatment: Oxygen  OT Visit Diagnosis: Other abnormalities of gait and mobility  (R26.89);Muscle weakness (generalized) (M62.81)   Activity Tolerance Treatment limited secondary to medical complications (Comment) (limited 2/2 respiratory condition)   Patient Left in chair;with call bell/phone within reach;with chair alarm set   Nurse Communication Mobility status        Time: 1025-1056 OT Time Calculation (min): 31 min  Charges: OT General Charges $OT Visit: 1 Visit OT Treatments $Self Care/Home Management : 23-37 mins  [facility] MS, OTR/L ascom [phone]  05/22/22, 2:12 PM

## 2022-05-22 NOTE — Telephone Encounter (Signed)
I have no problem serving as the attending provider for patient for hospice care. Please have them send over or drop off forms.

## 2022-05-23 DIAGNOSIS — Z743 Need for continuous supervision: Secondary | ICD-10-CM | POA: Diagnosis not present

## 2022-05-23 DIAGNOSIS — J9601 Acute respiratory failure with hypoxia: Secondary | ICD-10-CM | POA: Diagnosis not present

## 2022-05-23 DIAGNOSIS — Z7401 Bed confinement status: Secondary | ICD-10-CM | POA: Diagnosis not present

## 2022-05-23 DIAGNOSIS — R0902 Hypoxemia: Secondary | ICD-10-CM | POA: Diagnosis not present

## 2022-05-23 MED ORDER — MORPHINE SULFATE (CONCENTRATE) 10 MG/0.5ML PO SOLN: 5.0000 mg | ORAL | Status: DC | PRN

## 2022-05-23 MED ORDER — MORPHINE SULFATE (CONCENTRATE) 10 MG/0.5ML PO SOLN: 5.0000 mg | ORAL | 0 refills | Status: AC | PRN

## 2022-05-23 MED ORDER — LEVALBUTEROL HCL 0.63 MG/3ML IN NEBU: 0.6300 mg | INHALATION_SOLUTION | Freq: Four times a day (QID) | RESPIRATORY_TRACT | 12 refills | Status: AC | PRN

## 2022-05-23 MED ORDER — PREDNISONE 10 MG PO TABS: ORAL_TABLET | ORAL | 0 refills | Status: AC

## 2022-05-23 MED ORDER — CHLORHEXIDINE GLUCONATE CLOTH 2 % EX PADS
6.0000 | MEDICATED_PAD | Freq: Every day | CUTANEOUS | Status: DC
  Administered 2022-05-23: 6 via TOPICAL

## 2022-05-23 NOTE — Care Management Important Message (Signed)
Important Message  Patient Details  Name: SHERLEEN PANGBORN MRN: [id number] Date of Birth: April 16, 1936   Medicare Important Message Given:  Other (see comment)  Disposition to discharge with hospice services.  Medicare IM withheld at this time.    Dannette Barbara 05/23/2022, 1:22 PM

## 2022-05-23 NOTE — Progress Notes (Addendum)
[facility]   DME anticipated to arrive between 10-1 p.m. today. Once DME delivered and has been cleared medically, transport will be arranged.   If applicable, please send signed and completed DNR with patient/family upon discharge. Please provide prescriptions at discharge as needed to ensure ongoing symptom management and a transport packet.   Addendum 12:58 pm: Per spouse/Harold, DME has arrived and is requesting pick up via EMS at 1630 as he his not home. TOC/Ashley aware and is scheduling transport.   Please call with any questions/concerns.    Thank you for the opportunity to participate in this patient's care   Daphene Calamity, MSW [facility] Liaison  [phone]

## 2022-05-23 NOTE — Discharge Summary (Signed)
Physician Discharge Summary   Patient: [name] MRN: [id number] DOB: October 14, 1935  Admit date:     05/01/2022  Discharge date: 05/23/22  Discharge Physician: Fritzi Mandes   PCP: [name], NP   Recommendations at discharge:    F/u pcp and or Dr Lanney Gins as needed  Discharge Diagnoses: Principal Problem:   Acute respiratory failure with hypoxia ([place]) Active Problems:   HTN (hypertension)   Idiopathic pulmonary fibrosis ([facility])   Chronic pain syndrome   Ulcerative colitis ([place])   Acute pulmonary embolism ([place])   Interstitial lung disease [facility])  Hospital Course:  [name] is a 86 y.o. female with medical history significant for ILD, recurrent c diff, ulcerative colitis, chronic pain, chronic hypoxic respiratory failure, who presented with dyspnea and palpitations.   Reports a decline of several months with acute worsening the days PTA.  Reports having to increase baseline home o2 of 2-3 liters to 4 liters lately, has had several months of hypoxia, and has worsened over past few weeks to being dyspneic at rest worse with minimal ambulation.    Acute on chronic hypoxemic respiratory failure --recently, pt has been on 4-5L home O2.   --covid and RVP neg.  likely progressive ILD, pulm edema, and small PE contributing.  Questionable PNA.  Started on tx for all 4 etiologies on admission. --O2 requirement down  to 5L, sating in 90's. Patient desaturated's very easily even on trying to set up in the bed. Takes longer to recover. Very exhausting for her! Unable to participate actively with PT due to desaturation's. --Continue supplemental O2 to keep sats between 88-92%, wean as tolerated --prn morphine for air hunger and resp distress--pt agreeable   ILD Underlying severe ILD that is likely progressing  ---pulm consulted, Dr. Brayton Mars with plan --cont prednisone 10 mg qd -- completed antibiotic course   Bloody Diarrhea, resolved H/o UC --hold Eliquis    Acute blood loss anemia --Hgb dropped from 12.5 to 8.7 in 3 days from bloody diarrhea --s/p 1u pRBC for Hgb 7.8  Pulmonary embolus, acute s/p Mechanical thrombectomy and IVC filter on 9/6 with Dr Lucky Cowboy   --hold Eliquis due to bloody diarrhea   AKI, resolved --Cr went up to 1.68, from baseline of 0.7 --likely 2/2 to dehydration from diarrhea --improved with IVF   Afib w RVR --s/p amiodarone gtt --currently in NSR    Acute on chronic diastolic CHF --give prn lasix    History recurrent C diff --has multiple BM's per day, sometimes loose, but pt said that's her baseline --C diff neg on presentation   Chronic pain   palliative care input appreciated. Discussed with patient and patient will discharged to home with hospice later this afternoon. Equipment to be delivered this afternoon.  Patient is in agreement with plan DVT prophylaxis: SCD/Compression stockings Code Status: DNR  Family Communication: none today. Husband aware of plan Level of care: Progressive Dispo: home with hospice once equipment is set up    Disposition: Hospice care Diet recommendation:  Discharge Diet Orders (From admission, onward)     Start     Ordered   05/23/22 0000  Diet - low sodium heart healthy        05/23/22 1217           Cardiac diet DISCHARGE MEDICATION: Allergies as of 05/23/2022   No Known Allergies      Medication List     STOP taking these medications    denosumab 60 MG/ML Sosy injection  Laqueta Carina M.D.   On: 05/13/2022 08:13   DG Chest Port 1 View  Result Date: 05/09/2022 CLINICAL DATA:  Hypoxia. EXAM: PORTABLE CHEST 1 VIEW COMPARISON:  05/07/2022 FINDINGS: Persistent low lung volumes. Stable cardiomediastinal contours. Bilateral interstitial and airspace opacities are unchanged in the interval. This appears unchanged from the previous exam. Status post left shoulder arthroplasty. IVC filter. Kyphoplasty has been performed at T12 and L1. IMPRESSION: 1. No change in aeration to the lungs compared with previous exam. 2. Persistent low lung volumes. Electronically Signed   By: Kerby Moors M.D.   On: 05/09/2022 05:06   PERIPHERAL VASCULAR CATHETERIZATION  Result Date: 05/08/2022 See surgical note for result.  DG Chest Port 1 View  Result Date: 05/07/2022 CLINICAL DATA:  Infiltrative lung present on imaging study. EXAM: PORTABLE  CHEST 1 VIEW COMPARISON:  AP chest 05/04/2022 and 05/01/2021, CT chest 05/01/2022; chest two views 10/16/2021 FINDINGS: Cardiac silhouette is grossly at the upper limits of normal size. Mediastinal contours are not well evaluated given low lung volumes and frontal technique. Moderately decreased lung volumes are unchanged. Moderate bilateral interstitial thickening is similar to prior, noting chronic interstitial scarring on multiple prior studies. Mild patchy hazy opacities are similar to prior, likely superimposed atelectasis versus infection. No pleural effusion or pneumothorax. Mild dextrocurvature of the mid to lower thoracic spine. Multilevel degenerative disc and endplate changes. Kyphoplasty cement again overlies the T12 and L1 vertebral bodies. The right humeral head is again high-riding suggesting a superior right rotator cuff full-thickness tear. Partial visualization of reverse total left shoulder arthroplasty. IMPRESSION: No significant change in low lung volumes and moderate interstitial thickening with mild patchy acute airspace opacities. Findings are again consistent with chronic interstitial lung disease with superimposed mild atelectasis versus superimposed infection. Electronically Signed   By: Yvonne Kendall M.D.   On: 05/07/2022 08:27   DG Chest Port 1 View  Result Date: 05/04/2022 CLINICAL DATA:  Admitted due to dyspnea and palpitations. Reports a decline of several months with acute worsening the past few days. Reports palpitations with minimal movement present for several weeks. No chest pain. Reports having to increase baseline home o2 of 2-3 liters to 4 liters lately, has had several months of hypoxia, and has worsened over past few weeks EXAM: PORTABLE CHEST 1 VIEW COMPARISON:  05/01/2022 and older studies. FINDINGS: Interstitial and hazy airspace lung opacities have mildly improved. Lung volumes remain low accentuating the lung opacities. No convincing pleural effusion and no  pneumothorax. IMPRESSION: 1. Interval improvement. Mild decrease in the interstitial and hazy airspace lung opacities since the most recent prior study. No new abnormalities. Electronically Signed   By: Lajean Manes M.D.   On: 05/04/2022 09:27   ECHOCARDIOGRAM COMPLETE  Result Date: 05/01/2022    ECHOCARDIOGRAM REPORT   Patient Name:   [name] Date of Exam: 05/01/2022 Medical Rec #:  [id number]         Height:       58.0 in Accession #:    [id number]        Weight:       109.0 lb Date of Birth:  1935/09/12         BSA:          1.407 m Patient Age:    52 years          BP:           147/87 mmHg Patient Gender: F  Laqueta Carina M.D.   On: 05/13/2022 08:13   DG Chest Port 1 View  Result Date: 05/09/2022 CLINICAL DATA:  Hypoxia. EXAM: PORTABLE CHEST 1 VIEW COMPARISON:  05/07/2022 FINDINGS: Persistent low lung volumes. Stable cardiomediastinal contours. Bilateral interstitial and airspace opacities are unchanged in the interval. This appears unchanged from the previous exam. Status post left shoulder arthroplasty. IVC filter. Kyphoplasty has been performed at T12 and L1. IMPRESSION: 1. No change in aeration to the lungs compared with previous exam. 2. Persistent low lung volumes. Electronically Signed   By: Kerby Moors M.D.   On: 05/09/2022 05:06   PERIPHERAL VASCULAR CATHETERIZATION  Result Date: 05/08/2022 See surgical note for result.  DG Chest Port 1 View  Result Date: 05/07/2022 CLINICAL DATA:  Infiltrative lung present on imaging study. EXAM: PORTABLE  CHEST 1 VIEW COMPARISON:  AP chest 05/04/2022 and 05/01/2021, CT chest 05/01/2022; chest two views 10/16/2021 FINDINGS: Cardiac silhouette is grossly at the upper limits of normal size. Mediastinal contours are not well evaluated given low lung volumes and frontal technique. Moderately decreased lung volumes are unchanged. Moderate bilateral interstitial thickening is similar to prior, noting chronic interstitial scarring on multiple prior studies. Mild patchy hazy opacities are similar to prior, likely superimposed atelectasis versus infection. No pleural effusion or pneumothorax. Mild dextrocurvature of the mid to lower thoracic spine. Multilevel degenerative disc and endplate changes. Kyphoplasty cement again overlies the T12 and L1 vertebral bodies. The right humeral head is again high-riding suggesting a superior right rotator cuff full-thickness tear. Partial visualization of reverse total left shoulder arthroplasty. IMPRESSION: No significant change in low lung volumes and moderate interstitial thickening with mild patchy acute airspace opacities. Findings are again consistent with chronic interstitial lung disease with superimposed mild atelectasis versus superimposed infection. Electronically Signed   By: Yvonne Kendall M.D.   On: 05/07/2022 08:27   DG Chest Port 1 View  Result Date: 05/04/2022 CLINICAL DATA:  Admitted due to dyspnea and palpitations. Reports a decline of several months with acute worsening the past few days. Reports palpitations with minimal movement present for several weeks. No chest pain. Reports having to increase baseline home o2 of 2-3 liters to 4 liters lately, has had several months of hypoxia, and has worsened over past few weeks EXAM: PORTABLE CHEST 1 VIEW COMPARISON:  05/01/2022 and older studies. FINDINGS: Interstitial and hazy airspace lung opacities have mildly improved. Lung volumes remain low accentuating the lung opacities. No convincing pleural effusion and no  pneumothorax. IMPRESSION: 1. Interval improvement. Mild decrease in the interstitial and hazy airspace lung opacities since the most recent prior study. No new abnormalities. Electronically Signed   By: Lajean Manes M.D.   On: 05/04/2022 09:27   ECHOCARDIOGRAM COMPLETE  Result Date: 05/01/2022    ECHOCARDIOGRAM REPORT   Patient Name:   [name] Date of Exam: 05/01/2022 Medical Rec #:  [id number]         Height:       58.0 in Accession #:    [id number]        Weight:       109.0 lb Date of Birth:  1935/09/12         BSA:          1.407 m Patient Age:    52 years          BP:           147/87 mmHg Patient Gender: F  Laqueta Carina M.D.   On: 05/13/2022 08:13   DG Chest Port 1 View  Result Date: 05/09/2022 CLINICAL DATA:  Hypoxia. EXAM: PORTABLE CHEST 1 VIEW COMPARISON:  05/07/2022 FINDINGS: Persistent low lung volumes. Stable cardiomediastinal contours. Bilateral interstitial and airspace opacities are unchanged in the interval. This appears unchanged from the previous exam. Status post left shoulder arthroplasty. IVC filter. Kyphoplasty has been performed at T12 and L1. IMPRESSION: 1. No change in aeration to the lungs compared with previous exam. 2. Persistent low lung volumes. Electronically Signed   By: Kerby Moors M.D.   On: 05/09/2022 05:06   PERIPHERAL VASCULAR CATHETERIZATION  Result Date: 05/08/2022 See surgical note for result.  DG Chest Port 1 View  Result Date: 05/07/2022 CLINICAL DATA:  Infiltrative lung present on imaging study. EXAM: PORTABLE  CHEST 1 VIEW COMPARISON:  AP chest 05/04/2022 and 05/01/2021, CT chest 05/01/2022; chest two views 10/16/2021 FINDINGS: Cardiac silhouette is grossly at the upper limits of normal size. Mediastinal contours are not well evaluated given low lung volumes and frontal technique. Moderately decreased lung volumes are unchanged. Moderate bilateral interstitial thickening is similar to prior, noting chronic interstitial scarring on multiple prior studies. Mild patchy hazy opacities are similar to prior, likely superimposed atelectasis versus infection. No pleural effusion or pneumothorax. Mild dextrocurvature of the mid to lower thoracic spine. Multilevel degenerative disc and endplate changes. Kyphoplasty cement again overlies the T12 and L1 vertebral bodies. The right humeral head is again high-riding suggesting a superior right rotator cuff full-thickness tear. Partial visualization of reverse total left shoulder arthroplasty. IMPRESSION: No significant change in low lung volumes and moderate interstitial thickening with mild patchy acute airspace opacities. Findings are again consistent with chronic interstitial lung disease with superimposed mild atelectasis versus superimposed infection. Electronically Signed   By: Yvonne Kendall M.D.   On: 05/07/2022 08:27   DG Chest Port 1 View  Result Date: 05/04/2022 CLINICAL DATA:  Admitted due to dyspnea and palpitations. Reports a decline of several months with acute worsening the past few days. Reports palpitations with minimal movement present for several weeks. No chest pain. Reports having to increase baseline home o2 of 2-3 liters to 4 liters lately, has had several months of hypoxia, and has worsened over past few weeks EXAM: PORTABLE CHEST 1 VIEW COMPARISON:  05/01/2022 and older studies. FINDINGS: Interstitial and hazy airspace lung opacities have mildly improved. Lung volumes remain low accentuating the lung opacities. No convincing pleural effusion and no  pneumothorax. IMPRESSION: 1. Interval improvement. Mild decrease in the interstitial and hazy airspace lung opacities since the most recent prior study. No new abnormalities. Electronically Signed   By: Lajean Manes M.D.   On: 05/04/2022 09:27   ECHOCARDIOGRAM COMPLETE  Result Date: 05/01/2022    ECHOCARDIOGRAM REPORT   Patient Name:   [name] Date of Exam: 05/01/2022 Medical Rec #:  [id number]         Height:       58.0 in Accession #:    [id number]        Weight:       109.0 lb Date of Birth:  1935/09/12         BSA:          1.407 m Patient Age:    52 years          BP:           147/87 mmHg Patient Gender: F  Laqueta Carina M.D.   On: 05/13/2022 08:13   DG Chest Port 1 View  Result Date: 05/09/2022 CLINICAL DATA:  Hypoxia. EXAM: PORTABLE CHEST 1 VIEW COMPARISON:  05/07/2022 FINDINGS: Persistent low lung volumes. Stable cardiomediastinal contours. Bilateral interstitial and airspace opacities are unchanged in the interval. This appears unchanged from the previous exam. Status post left shoulder arthroplasty. IVC filter. Kyphoplasty has been performed at T12 and L1. IMPRESSION: 1. No change in aeration to the lungs compared with previous exam. 2. Persistent low lung volumes. Electronically Signed   By: Kerby Moors M.D.   On: 05/09/2022 05:06   PERIPHERAL VASCULAR CATHETERIZATION  Result Date: 05/08/2022 See surgical note for result.  DG Chest Port 1 View  Result Date: 05/07/2022 CLINICAL DATA:  Infiltrative lung present on imaging study. EXAM: PORTABLE  CHEST 1 VIEW COMPARISON:  AP chest 05/04/2022 and 05/01/2021, CT chest 05/01/2022; chest two views 10/16/2021 FINDINGS: Cardiac silhouette is grossly at the upper limits of normal size. Mediastinal contours are not well evaluated given low lung volumes and frontal technique. Moderately decreased lung volumes are unchanged. Moderate bilateral interstitial thickening is similar to prior, noting chronic interstitial scarring on multiple prior studies. Mild patchy hazy opacities are similar to prior, likely superimposed atelectasis versus infection. No pleural effusion or pneumothorax. Mild dextrocurvature of the mid to lower thoracic spine. Multilevel degenerative disc and endplate changes. Kyphoplasty cement again overlies the T12 and L1 vertebral bodies. The right humeral head is again high-riding suggesting a superior right rotator cuff full-thickness tear. Partial visualization of reverse total left shoulder arthroplasty. IMPRESSION: No significant change in low lung volumes and moderate interstitial thickening with mild patchy acute airspace opacities. Findings are again consistent with chronic interstitial lung disease with superimposed mild atelectasis versus superimposed infection. Electronically Signed   By: Yvonne Kendall M.D.   On: 05/07/2022 08:27   DG Chest Port 1 View  Result Date: 05/04/2022 CLINICAL DATA:  Admitted due to dyspnea and palpitations. Reports a decline of several months with acute worsening the past few days. Reports palpitations with minimal movement present for several weeks. No chest pain. Reports having to increase baseline home o2 of 2-3 liters to 4 liters lately, has had several months of hypoxia, and has worsened over past few weeks EXAM: PORTABLE CHEST 1 VIEW COMPARISON:  05/01/2022 and older studies. FINDINGS: Interstitial and hazy airspace lung opacities have mildly improved. Lung volumes remain low accentuating the lung opacities. No convincing pleural effusion and no  pneumothorax. IMPRESSION: 1. Interval improvement. Mild decrease in the interstitial and hazy airspace lung opacities since the most recent prior study. No new abnormalities. Electronically Signed   By: Lajean Manes M.D.   On: 05/04/2022 09:27   ECHOCARDIOGRAM COMPLETE  Result Date: 05/01/2022    ECHOCARDIOGRAM REPORT   Patient Name:   [name] Date of Exam: 05/01/2022 Medical Rec #:  [id number]         Height:       58.0 in Accession #:    [id number]        Weight:       109.0 lb Date of Birth:  1935/09/12         BSA:          1.407 m Patient Age:    52 years          BP:           147/87 mmHg Patient Gender: F  Physician Discharge Summary   Patient: [name] MRN: [id number] DOB: October 14, 1935  Admit date:     05/01/2022  Discharge date: 05/23/22  Discharge Physician: Fritzi Mandes   PCP: [name], NP   Recommendations at discharge:    F/u pcp and or Dr Lanney Gins as needed  Discharge Diagnoses: Principal Problem:   Acute respiratory failure with hypoxia ([place]) Active Problems:   HTN (hypertension)   Idiopathic pulmonary fibrosis ([place])   Chronic pain syndrome   Ulcerative colitis ([place])   Acute pulmonary embolism ([place])   Interstitial lung disease [facility])  Hospital Course:  [name] is a 86 y.o. female with medical history significant for ILD, recurrent c diff, ulcerative colitis, chronic pain, chronic hypoxic respiratory failure, who presented with dyspnea and palpitations.   Reports a decline of several months with acute worsening the days PTA.  Reports having to increase baseline home o2 of 2-3 liters to 4 liters lately, has had several months of hypoxia, and has worsened over past few weeks to being dyspneic at rest worse with minimal ambulation.    Acute on chronic hypoxemic respiratory failure --recently, pt has been on 4-5L home O2.   --covid and RVP neg.  likely progressive ILD, pulm edema, and small PE contributing.  Questionable PNA.  Started on tx for all 4 etiologies on admission. --O2 requirement down  to 5L, sating in 90's. Patient desaturated's very easily even on trying to set up in the bed. Takes longer to recover. Very exhausting for her! Unable to participate actively with PT due to desaturation's. --Continue supplemental O2 to keep sats between 88-92%, wean as tolerated --prn morphine for air hunger and resp distress--pt agreeable   ILD Underlying severe ILD that is likely progressing  ---pulm consulted, Dr. Brayton Mars with plan --cont prednisone 10 mg qd -- completed antibiotic course   Bloody Diarrhea, resolved H/o UC --hold Eliquis    Acute blood loss anemia --Hgb dropped from 12.5 to 8.7 in 3 days from bloody diarrhea --s/p 1u pRBC for Hgb 7.8  Pulmonary embolus, acute s/p Mechanical thrombectomy and IVC filter on 9/6 with Dr Lucky Cowboy   --hold Eliquis due to bloody diarrhea   AKI, resolved --Cr went up to 1.68, from baseline of 0.7 --likely 2/2 to dehydration from diarrhea --improved with IVF   Afib w RVR --s/p amiodarone gtt --currently in NSR    Acute on chronic diastolic CHF --give prn lasix    History recurrent C diff --has multiple BM's per day, sometimes loose, but pt said that's her baseline --C diff neg on presentation   Chronic pain   palliative care input appreciated. Discussed with patient and patient will discharged to home with hospice later this afternoon. Equipment to be delivered this afternoon.  Patient is in agreement with plan DVT prophylaxis: SCD/Compression stockings Code Status: DNR  Family Communication: none today. Husband aware of plan Level of care: Progressive Dispo: home with hospice once equipment is set up    Disposition: Hospice care Diet recommendation:  Discharge Diet Orders (From admission, onward)     Start     Ordered   05/23/22 0000  Diet - low sodium heart healthy        05/23/22 1217           Cardiac diet DISCHARGE MEDICATION: Allergies as of 05/23/2022   No Known Allergies      Medication List     STOP taking these medications    denosumab 60 MG/ML Sosy injection  Physician Discharge Summary   Patient: [name] MRN: [id number] DOB: October 14, 1935  Admit date:     05/01/2022  Discharge date: 05/23/22  Discharge Physician: Fritzi Mandes   PCP: [name], NP   Recommendations at discharge:    F/u pcp and or Dr Lanney Gins as needed  Discharge Diagnoses: Principal Problem:   Acute respiratory failure with hypoxia ([place]) Active Problems:   HTN (hypertension)   Idiopathic pulmonary fibrosis ([place])   Chronic pain syndrome   Ulcerative colitis ([place])   Acute pulmonary embolism ([place])   Interstitial lung disease [facility])  Hospital Course:  [name] is a 86 y.o. female with medical history significant for ILD, recurrent c diff, ulcerative colitis, chronic pain, chronic hypoxic respiratory failure, who presented with dyspnea and palpitations.   Reports a decline of several months with acute worsening the days PTA.  Reports having to increase baseline home o2 of 2-3 liters to 4 liters lately, has had several months of hypoxia, and has worsened over past few weeks to being dyspneic at rest worse with minimal ambulation.    Acute on chronic hypoxemic respiratory failure --recently, pt has been on 4-5L home O2.   --covid and RVP neg.  likely progressive ILD, pulm edema, and small PE contributing.  Questionable PNA.  Started on tx for all 4 etiologies on admission. --O2 requirement down  to 5L, sating in 90's. Patient desaturated's very easily even on trying to set up in the bed. Takes longer to recover. Very exhausting for her! Unable to participate actively with PT due to desaturation's. --Continue supplemental O2 to keep sats between 88-92%, wean as tolerated --prn morphine for air hunger and resp distress--pt agreeable   ILD Underlying severe ILD that is likely progressing  ---pulm consulted, Dr. Brayton Mars with plan --cont prednisone 10 mg qd -- completed antibiotic course   Bloody Diarrhea, resolved H/o UC --hold Eliquis    Acute blood loss anemia --Hgb dropped from 12.5 to 8.7 in 3 days from bloody diarrhea --s/p 1u pRBC for Hgb 7.8  Pulmonary embolus, acute s/p Mechanical thrombectomy and IVC filter on 9/6 with Dr Lucky Cowboy   --hold Eliquis due to bloody diarrhea   AKI, resolved --Cr went up to 1.68, from baseline of 0.7 --likely 2/2 to dehydration from diarrhea --improved with IVF   Afib w RVR --s/p amiodarone gtt --currently in NSR    Acute on chronic diastolic CHF --give prn lasix    History recurrent C diff --has multiple BM's per day, sometimes loose, but pt said that's her baseline --C diff neg on presentation   Chronic pain   palliative care input appreciated. Discussed with patient and patient will discharged to home with hospice later this afternoon. Equipment to be delivered this afternoon.  Patient is in agreement with plan DVT prophylaxis: SCD/Compression stockings Code Status: DNR  Family Communication: none today. Husband aware of plan Level of care: Progressive Dispo: home with hospice once equipment is set up    Disposition: Hospice care Diet recommendation:  Discharge Diet Orders (From admission, onward)     Start     Ordered   05/23/22 0000  Diet - low sodium heart healthy        05/23/22 1217           Cardiac diet DISCHARGE MEDICATION: Allergies as of 05/23/2022   No Known Allergies      Medication List     STOP taking these medications    denosumab 60 MG/ML Sosy injection  Physician Discharge Summary   Patient: [name] MRN: [id number] DOB: October 14, 1935  Admit date:     05/01/2022  Discharge date: 05/23/22  Discharge Physician: Fritzi Mandes   PCP: [name], NP   Recommendations at discharge:    F/u pcp and or Dr Lanney Gins as needed  Discharge Diagnoses: Principal Problem:   Acute respiratory failure with hypoxia ([place]) Active Problems:   HTN (hypertension)   Idiopathic pulmonary fibrosis ([place])   Chronic pain syndrome   Ulcerative colitis ([place])   Acute pulmonary embolism ([place])   Interstitial lung disease [facility])  Hospital Course:  [name] is a 86 y.o. female with medical history significant for ILD, recurrent c diff, ulcerative colitis, chronic pain, chronic hypoxic respiratory failure, who presented with dyspnea and palpitations.   Reports a decline of several months with acute worsening the days PTA.  Reports having to increase baseline home o2 of 2-3 liters to 4 liters lately, has had several months of hypoxia, and has worsened over past few weeks to being dyspneic at rest worse with minimal ambulation.    Acute on chronic hypoxemic respiratory failure --recently, pt has been on 4-5L home O2.   --covid and RVP neg.  likely progressive ILD, pulm edema, and small PE contributing.  Questionable PNA.  Started on tx for all 4 etiologies on admission. --O2 requirement down  to 5L, sating in 90's. Patient desaturated's very easily even on trying to set up in the bed. Takes longer to recover. Very exhausting for her! Unable to participate actively with PT due to desaturation's. --Continue supplemental O2 to keep sats between 88-92%, wean as tolerated --prn morphine for air hunger and resp distress--pt agreeable   ILD Underlying severe ILD that is likely progressing  ---pulm consulted, Dr. Brayton Mars with plan --cont prednisone 10 mg qd -- completed antibiotic course   Bloody Diarrhea, resolved H/o UC --hold Eliquis    Acute blood loss anemia --Hgb dropped from 12.5 to 8.7 in 3 days from bloody diarrhea --s/p 1u pRBC for Hgb 7.8  Pulmonary embolus, acute s/p Mechanical thrombectomy and IVC filter on 9/6 with Dr Lucky Cowboy   --hold Eliquis due to bloody diarrhea   AKI, resolved --Cr went up to 1.68, from baseline of 0.7 --likely 2/2 to dehydration from diarrhea --improved with IVF   Afib w RVR --s/p amiodarone gtt --currently in NSR    Acute on chronic diastolic CHF --give prn lasix    History recurrent C diff --has multiple BM's per day, sometimes loose, but pt said that's her baseline --C diff neg on presentation   Chronic pain   palliative care input appreciated. Discussed with patient and patient will discharged to home with hospice later this afternoon. Equipment to be delivered this afternoon.  Patient is in agreement with plan DVT prophylaxis: SCD/Compression stockings Code Status: DNR  Family Communication: none today. Husband aware of plan Level of care: Progressive Dispo: home with hospice once equipment is set up    Disposition: Hospice care Diet recommendation:  Discharge Diet Orders (From admission, onward)     Start     Ordered   05/23/22 0000  Diet - low sodium heart healthy        05/23/22 1217           Cardiac diet DISCHARGE MEDICATION: Allergies as of 05/23/2022   No Known Allergies      Medication List     STOP taking these medications    denosumab 60 MG/ML Sosy injection  Laqueta Carina M.D.   On: 05/13/2022 08:13   DG Chest Port 1 View  Result Date: 05/09/2022 CLINICAL DATA:  Hypoxia. EXAM: PORTABLE CHEST 1 VIEW COMPARISON:  05/07/2022 FINDINGS: Persistent low lung volumes. Stable cardiomediastinal contours. Bilateral interstitial and airspace opacities are unchanged in the interval. This appears unchanged from the previous exam. Status post left shoulder arthroplasty. IVC filter. Kyphoplasty has been performed at T12 and L1. IMPRESSION: 1. No change in aeration to the lungs compared with previous exam. 2. Persistent low lung volumes. Electronically Signed   By: Kerby Moors M.D.   On: 05/09/2022 05:06   PERIPHERAL VASCULAR CATHETERIZATION  Result Date: 05/08/2022 See surgical note for result.  DG Chest Port 1 View  Result Date: 05/07/2022 CLINICAL DATA:  Infiltrative lung present on imaging study. EXAM: PORTABLE  CHEST 1 VIEW COMPARISON:  AP chest 05/04/2022 and 05/01/2021, CT chest 05/01/2022; chest two views 10/16/2021 FINDINGS: Cardiac silhouette is grossly at the upper limits of normal size. Mediastinal contours are not well evaluated given low lung volumes and frontal technique. Moderately decreased lung volumes are unchanged. Moderate bilateral interstitial thickening is similar to prior, noting chronic interstitial scarring on multiple prior studies. Mild patchy hazy opacities are similar to prior, likely superimposed atelectasis versus infection. No pleural effusion or pneumothorax. Mild dextrocurvature of the mid to lower thoracic spine. Multilevel degenerative disc and endplate changes. Kyphoplasty cement again overlies the T12 and L1 vertebral bodies. The right humeral head is again high-riding suggesting a superior right rotator cuff full-thickness tear. Partial visualization of reverse total left shoulder arthroplasty. IMPRESSION: No significant change in low lung volumes and moderate interstitial thickening with mild patchy acute airspace opacities. Findings are again consistent with chronic interstitial lung disease with superimposed mild atelectasis versus superimposed infection. Electronically Signed   By: Yvonne Kendall M.D.   On: 05/07/2022 08:27   DG Chest Port 1 View  Result Date: 05/04/2022 CLINICAL DATA:  Admitted due to dyspnea and palpitations. Reports a decline of several months with acute worsening the past few days. Reports palpitations with minimal movement present for several weeks. No chest pain. Reports having to increase baseline home o2 of 2-3 liters to 4 liters lately, has had several months of hypoxia, and has worsened over past few weeks EXAM: PORTABLE CHEST 1 VIEW COMPARISON:  05/01/2022 and older studies. FINDINGS: Interstitial and hazy airspace lung opacities have mildly improved. Lung volumes remain low accentuating the lung opacities. No convincing pleural effusion and no  pneumothorax. IMPRESSION: 1. Interval improvement. Mild decrease in the interstitial and hazy airspace lung opacities since the most recent prior study. No new abnormalities. Electronically Signed   By: Lajean Manes M.D.   On: 05/04/2022 09:27   ECHOCARDIOGRAM COMPLETE  Result Date: 05/01/2022    ECHOCARDIOGRAM REPORT   Patient Name:   [name] Date of Exam: 05/01/2022 Medical Rec #:  [id number]         Height:       58.0 in Accession #:    [id number]        Weight:       109.0 lb Date of Birth:  1935/09/12         BSA:          1.407 m Patient Age:    52 years          BP:           147/87 mmHg Patient Gender: F

## 2022-05-23 NOTE — Telephone Encounter (Signed)
Called [facility]) approval given

## 2022-05-23 NOTE — TOC Transition Note (Signed)
Transition of Care [facility]) - CM/SW Discharge Note   Patient Details  Name: [name] MRN: [id number] Date of Birth: [birth year]  Transition of Care [facility]) CM/SW Contact:  Alberteen Sam, LCSW Phone Number: 05/23/2022, 4:25 PM   Clinical Narrative:     Patient will DC to: home with [facility] hospice Anticipated DC date: 05/23/22 Family notified: husband Transport by: Johnanna Schneiders  Per MD patient ready for DC to home with [place]. RN, patient, patient's family, and facility notified of DC. DC packet on chart. Ambulance transport requested for patient for 4:30 pick up.  CSW signing off.  Pricilla Riffle, LCSW    Final next level of care: Home w Hospice Care Barriers to Discharge: No Barriers Identified   Patient Goals and CMS Choice Patient states their goals for this hospitalization and ongoing recovery are:: to go home CMS Medicare.gov Compare Post Acute Care list provided to:: Patient Choice offered to / list presented to : Patient  Discharge Placement                       Discharge Plan and Services     Post Acute Care Choice: Home Health                               Social Determinants of Health (SDOH) Interventions     Readmission Risk Interventions     No data to display

## 2022-05-27 ENCOUNTER — Telehealth: Payer: Self-pay

## 2022-05-27 NOTE — Telephone Encounter (Signed)
My condolences to the family very sad to hear about it

## 2022-05-27 NOTE — Telephone Encounter (Signed)
Notification of death was received via fax.  Form placed in your box for review.

## 2022-05-28 ENCOUNTER — Telehealth: Payer: Self-pay | Admitting: Primary Care

## 2022-05-28 NOTE — Telephone Encounter (Addendum)
Dee from [facility] called and needs a death certificate signed case # [id number]. Call back number [phone]

## 2022-05-28 NOTE — Telephone Encounter (Signed)
Death Certificate completed.

## 2022-05-28 NOTE — Telephone Encounter (Signed)
Called let them know it was completed.

## 2022-05-30 ENCOUNTER — Telehealth: Payer: Self-pay

## 2022-05-30 NOTE — Telephone Encounter (Signed)
[name]. [name]'s husband called stating that his wife passed away on 2022-06-12 and he wanted me to call [facility] to let them know that his wife had passed and that services needed to be cancelled. I then called [facility] and spoke with Abby and I let her know about [name] passing away and that services needed to be cancelled. Abby stated that she would let Ashley-RN and pharmacist know and that her services will be cancelled. I also asked her if there was anybody else that I needed to contact and Abby stated that I didn't.

## 2022-05-30 NOTE — Telephone Encounter (Signed)
Attempted to reach patient's husband via phone, unable to connect. Will respond to husband's MyChart message.

## 2022-06-02 DEATH — deceased

## 2022-11-11 ENCOUNTER — Telehealth: Payer: Medicare HMO

## 2023-04-15 IMAGING — CT CT RENAL STONE PROTOCOL
2 of 4 series · 16 of 46 positions shown, 18 images · non-contrast
Comparison: Head CT 07/23/2012

CLINICAL DATA: Flank pain with kidney stone suspected. Pain is on
the left and associated with vomiting

EXAM:
CT ABDOMEN AND PELVIS WITHOUT CONTRAST
TECHNIQUE: Multidetector CT imaging of the abdomen and pelvis was performed
following the standard protocol without IV contrast.

[Series 2: stone full standard (person_name) · axial · 0.89mm/px · z∈[-838,-408]mm · 13 of 94 slices shown, 15 images]
[im 4/94  soft-tissue]
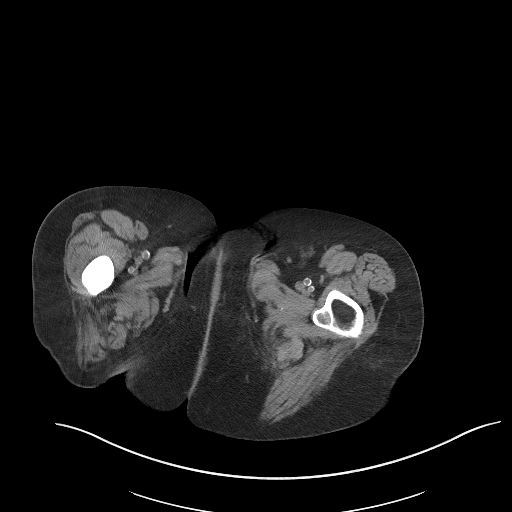
[im 4/94  bone]
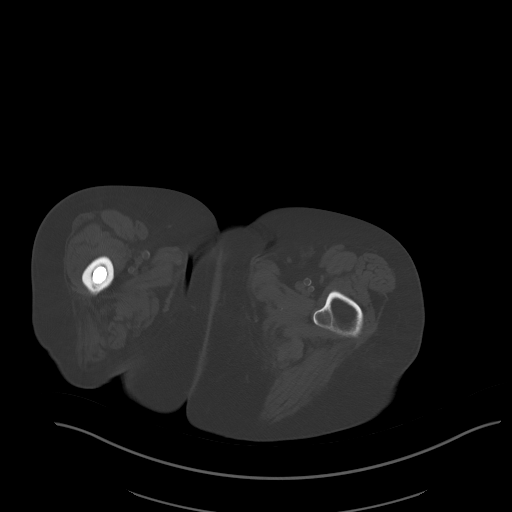
[im 12/94  soft-tissue]
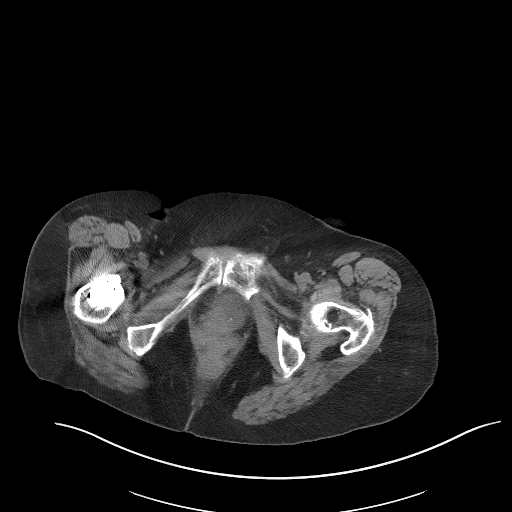
[im 20/94  soft-tissue]
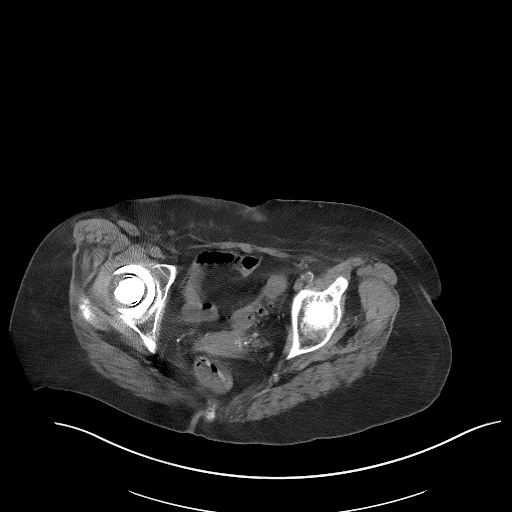
[im 28/94  soft-tissue]
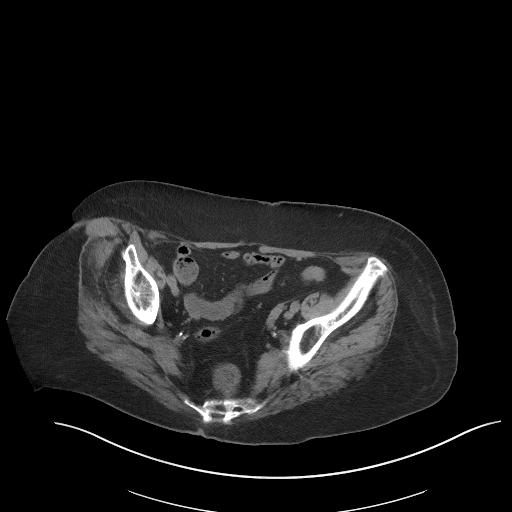
[im 32/94  soft-tissue]
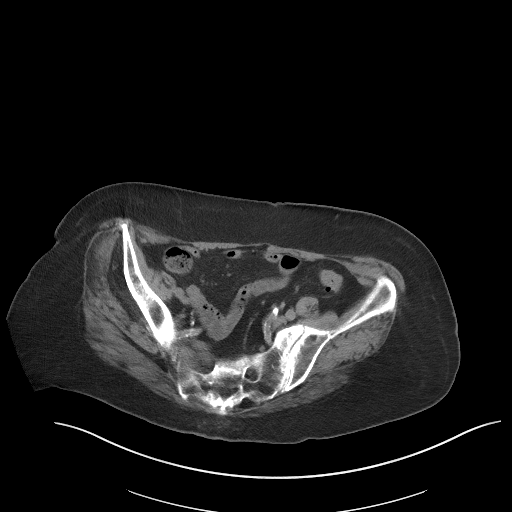
[im 39/94  soft-tissue]
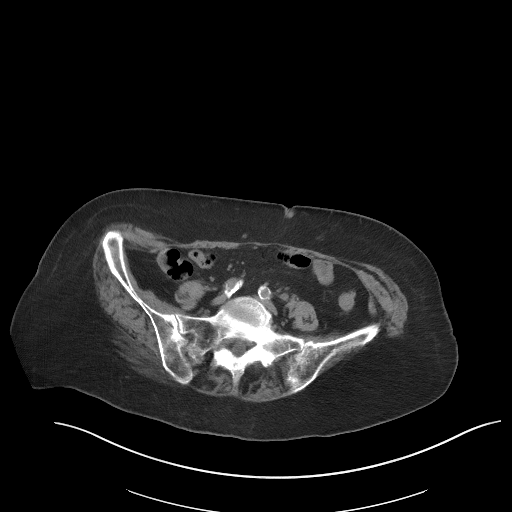
[im 47/94  soft-tissue]
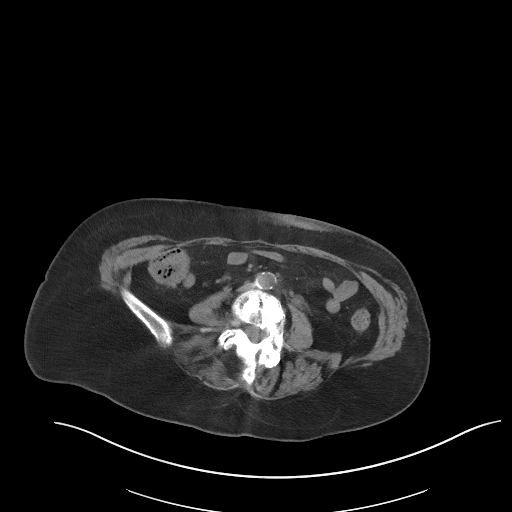
[im 55/94  soft-tissue]
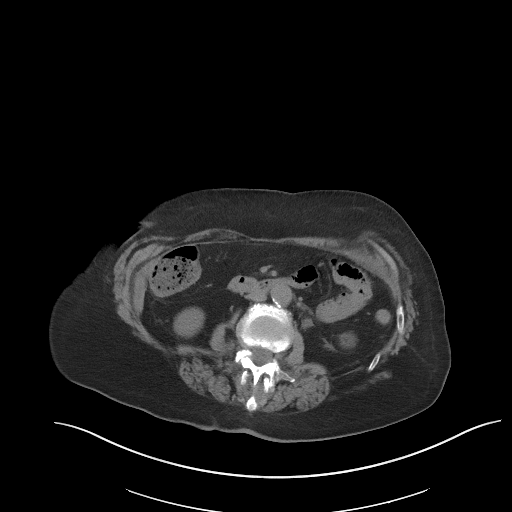
[im 63/94  soft-tissue]
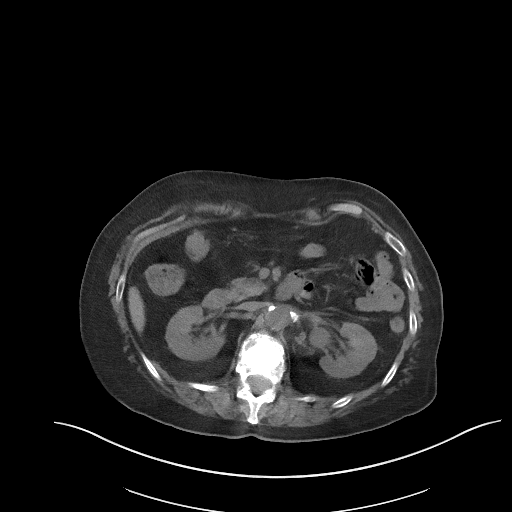
[im 63/94  bone]
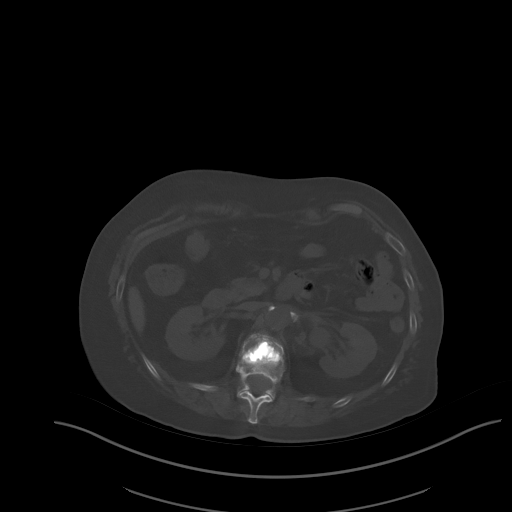
[im 66/94  soft-tissue]
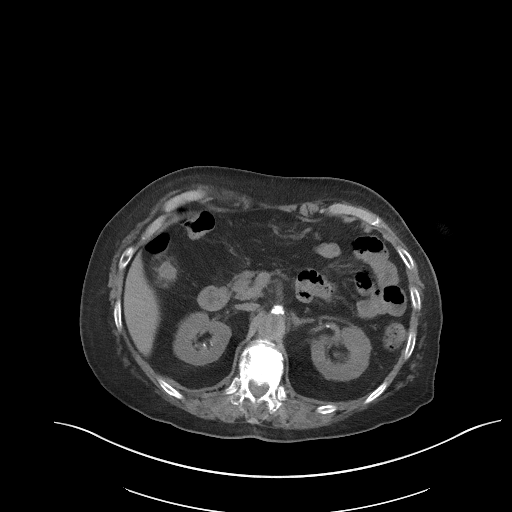
[im 74/94  soft-tissue]
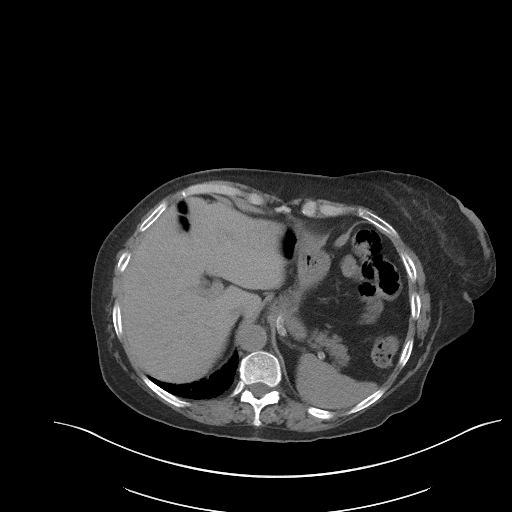
[im 82/94  soft-tissue]
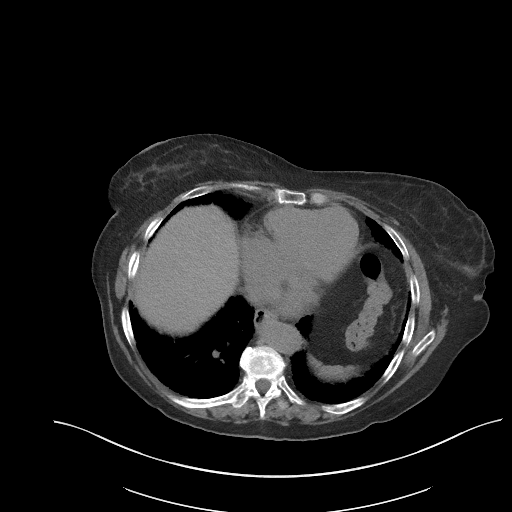
[im 90/94  soft-tissue]
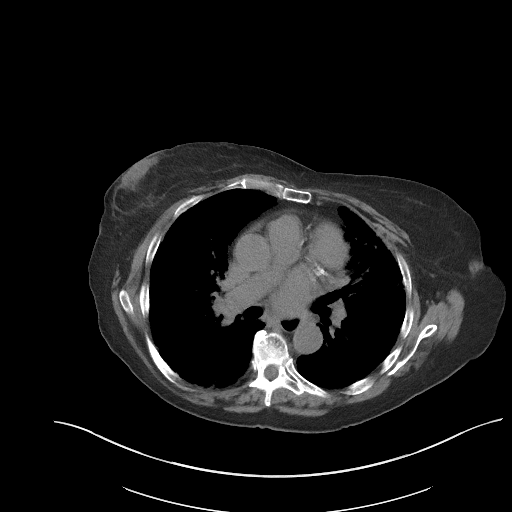

[Series 5: coronal · coronal · 0.79mm/px · 3 of 139 slices shown]
[im 47/139  soft-tissue]
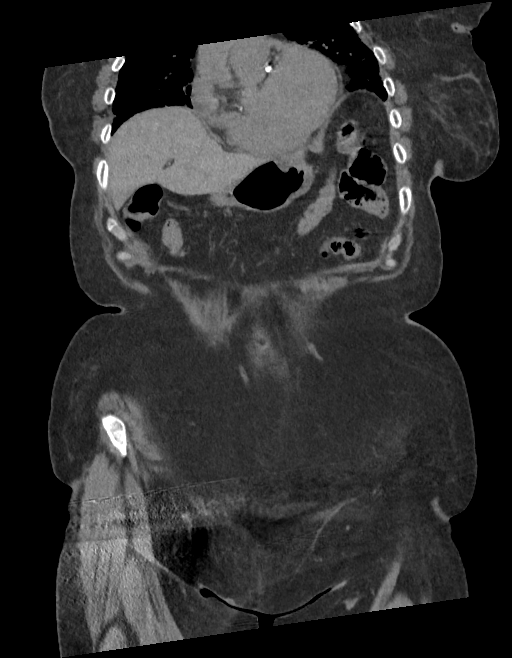
[im 62/139  soft-tissue]
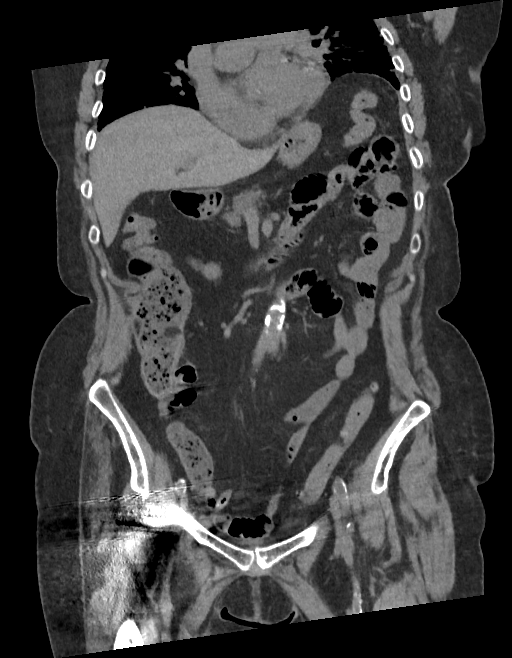
[im 77/139  soft-tissue]
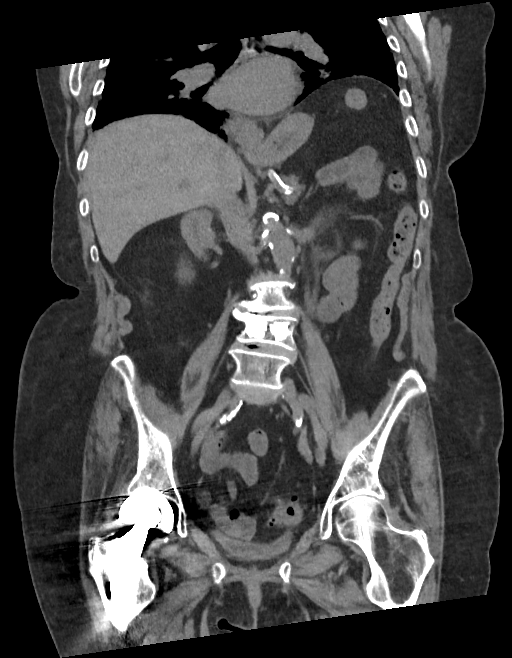

[16 of 46 positions shown; findings below may reference images not displayed]

FINDINGS: Lower chest: Pulmonary fibrosis with honeycombing and traction
bronchiectasis. No acute finding. Coronary atherosclerosis.

Hepatobiliary: No focal liver abnormality.Absent gallbladder.

Pancreas: Unremarkable.

Spleen: Unremarkable.

Adrenals/Urinary Tract: Negative adrenals. Left
hydroureteronephrosis secondary to a distal ureteral calculus just
above the UVJ which measures 7 x 2 mm on reformats. Punctate renal
calculi numbering 2 on the right and single on the left.
Unremarkable bladder.

Stomach/Bowel: No obstruction. Probable appendectomy. Left colonic
diverticulosis.

Vascular/Lymphatic: No acute vascular abnormality. Scattered
atheromatous calcifications. No mass or adenopathy.

Reproductive:Unremarkable for age

Other: No ascites or pneumoperitoneum.

Musculoskeletal: No acute abnormalities. Advanced lumbar spine
degeneration with scoliosis. Remote T12, L1, L3, and L4 compression
fractures prior cement augmentation sparing the L3 level. Right hip
arthroplasty which is unremarkable. Severe foraminal narrowing on
the right at L3-4.
IMPRESSION: 1. Left hydroureteronephrosis from a 7 x 2 mm distal ureteral
calculus.
2. Punctate bilateral renal calculi.
3. Pulmonary fibrosis.
